# Patient Record
Sex: Male | Born: 1950 | Race: Black or African American | Hispanic: No | Marital: Married | Smoking: Never smoker
Health system: Southern US, Community
[De-identification: ages and names within clinical notes are randomized; demographics above are authoritative.]

## PROBLEM LIST (undated history)

## (undated) DIAGNOSIS — I10 Essential (primary) hypertension: Secondary | ICD-10-CM

## (undated) DIAGNOSIS — Z91013 Allergy to seafood: Secondary | ICD-10-CM

## (undated) DIAGNOSIS — E861 Hypovolemia: Secondary | ICD-10-CM

## (undated) DIAGNOSIS — N179 Acute kidney failure, unspecified: Secondary | ICD-10-CM

## (undated) DIAGNOSIS — Z960 Presence of urogenital implants: Secondary | ICD-10-CM

## (undated) DIAGNOSIS — R748 Abnormal levels of other serum enzymes: Secondary | ICD-10-CM

## (undated) DIAGNOSIS — J9621 Acute and chronic respiratory failure with hypoxia: Secondary | ICD-10-CM

## (undated) DIAGNOSIS — Z931 Gastrostomy status: Secondary | ICD-10-CM

## (undated) DIAGNOSIS — Z930 Tracheostomy status: Secondary | ICD-10-CM

## (undated) DIAGNOSIS — E785 Hyperlipidemia, unspecified: Secondary | ICD-10-CM

## (undated) DIAGNOSIS — G839 Paralytic syndrome, unspecified: Secondary | ICD-10-CM

## (undated) DIAGNOSIS — I469 Cardiac arrest, cause unspecified: Secondary | ICD-10-CM

## (undated) DIAGNOSIS — G909 Disorder of the autonomic nervous system, unspecified: Secondary | ICD-10-CM

## (undated) DIAGNOSIS — S14109S Unspecified injury at unspecified level of cervical spinal cord, sequela: Secondary | ICD-10-CM

## (undated) HISTORY — DX: Abnormal levels of other serum enzymes: R74.8

## (undated) HISTORY — DX: Acute kidney failure, unspecified: N17.9

## (undated) HISTORY — DX: Hyperlipidemia, unspecified: E78.5

## (undated) HISTORY — DX: Allergy to seafood: Z91.013

## (undated) HISTORY — DX: Essential (primary) hypertension: I10

## (undated) NOTE — *Deleted (*Deleted)
[facility] Progress Note   Subjective:  Patient seen in room sitting in chair working with PT. States thirst is about the same as yesterday. Tolerated fluid boluses via PEG without vomiting.  Sodium level down to 154 this morning from 156 (level of 144 likely spurious). BUN also downtrending to 50 from 55.  Denies chest pain, SOB, diarrhea.    Objective Vitals:   07/04/20 0414 07/04/20 0434 07/04/20 0830 07/04/20 0901  BP:  107/63 (!) 102/52   Pulse: 77 76 78 75  Resp: 20 19  20   Temp:  98.8 F (37.1 C)    TempSrc:  Oral    SpO2: 97% 98%  95%  Weight:      Height:       Physical Exam General: Older African-American male performing therapies in wheelchair and in no acute distress. Skin: Face appears greasy, lips and buccal mucosa are dry. Poor turgor noted in bilateral upper extremities with tenting. Head: NCAT, sclera not icteric. Neck: Supple. No lymphadenopathy. Tracheostomy collar in place. Lungs: CTA bilaterally, anteriorly. No wheeze or rales. Scattered rhonchi in upper airways. Breathing is unlabored. Heart: RRR. No murmur, rubs or gallops.  Abdomen: Soft, nontender, no guarding, no rebound tenderness. Abdominal binder in place. PEG tube noted in left upper quadrant. M/S: Quadriplegic. Flaccidity noted in bilateral upper and lower extremities. Lower extremities: Legs and feet wrapped with ace wraps. No apparent edema. Unable to appreciate wounds or skin integrity of LE. Neuro: AAOx3. Neuromuscular exam consistent with SCI. Psych:  Responds to questions appropriately with a normal affect. Appropriate mood for time and situation.  Filed Weights   06/23/20 1730  Weight: 105.2 kg    Intake/Output Summary (Last 24 hours) at 07/04/2020 1106 Last data filed at 07/03/2020 2123 Gross per 24 hour  Intake 948 ml  Output 1300 ml  Net -352 ml    Additional Objective Labs: Basic Metabolic Panel: Recent Labs  Lab 07/03/20 0611 07/03/20 1913 07/04/20 0443   NA 156* 144 154*  K 3.8 3.9 3.6  CL 117* 114* 114*  CO2 26 19* 31  GLUCOSE 111* 173* 102*  BUN 55* 22 50*  CREATININE 1.02 0.83 0.97  CALCIUM 10.0 9.0 9.8   CBC: Recent Labs  Lab 07/03/20 0611  WBC 10.3  HGB 10.2*  HCT 33.1*  MCV 94.6  PLT 314    CBG: Recent Labs  Lab 07/03/20 2008 07/04/20 0021 07/04/20 0416 07/04/20 0750 07/04/20 1104  GLUCAP 185* 223* 93 102* 155*   Medications:  . vitamin C  500 mg Per Tube BID  . bisacodyl  10 mg Rectal QHS  . chlorhexidine  15 mL Mouth Rinse BID  . Chlorhexidine Gluconate Cloth  6 each Topical BID  . collagenase   Topical Daily  . enoxaparin (LOVENOX) injection  40 mg Subcutaneous Q24H  . famotidine  20 mg Per Tube Daily  . feeding supplement (OSMOLITE 1.5 CAL)  474 mL Per Tube QID  . feeding supplement (PROSource TF)  45 mL Per Tube BID  . [START ON 07/05/2020] fludrocortisone  0.2 mg Per Tube Daily  . FLUoxetine  40 mg Per Tube Daily  . free water  400 mL Per Tube Q2H  . guaiFENesin  400 mg Per Tube Q6H  . insulin aspart  0-9 Units Subcutaneous Q4H  . lidocaine  1 patch Transdermal Q24H  . mouth rinse  15 mL Mouth Rinse q12n4p  . midodrine  5 mg Per Tube TID WC  . multivitamin  15 mL Per Tube Daily  . neomycin-bacitracin-polymyxin   Topical BID  . nutrition supplement (JUVEN)  1 packet Per Tube BID BM  . saccharomyces boulardii  250 mg Per Tube BID  . scopolamine  1 patch Transdermal Q72H  . sennosides  5 mL Per Tube Q0600  . traZODone  50 mg Per Tube QHS  . zinc sulfate  220 mg Per Tube Daily    Assessment/Plan: 1. Hypernatremia/Dehydration - in the setting of recent SCI and hypovolemia. Free water deficit = 6L. Continue free water flushes to q2hrs. Continue BMP q12hrs. Titrate flushes to Na level. Strict I/Os.  Once cleared to PO from ST standpoint, allow patient to have unlimited access to free water. This is not diabetes insipidus, it is inadequate access to free water and significant obligate solute  losses related to high protein intake. Should correct with titrated free water, hopefully enteral will be sufficient, but parenteral might be necessary.  Target correction is a sodium of around 150 within the next 24 hours. 2. Orthostatic Hypotension - Likely related to hypovolemia. On fludrocortisone and midodrine.  Per CIR 3. Chronic anemia - Hgb stable at 10.2. Management per primary. 4. Neurogenic bladder and bowel - Maintain foley catheter and monitor strict I/Os. Bowel regimen as per primary. No sodium phos fleet enemas.  Wendall Stade, PA-S2 [facility] 07/04/2020,11:06 AM  LOS: 11 days

## (undated) NOTE — *Deleted (*Deleted)
[facility] Renal Consultation Note    Indication for Consultation:  Hypernatremia  ZRA:QTMAU, Tiney Rouge, MD  HPI: Ryan Day is a 40 y.o. 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 Nephrology was consulted for persistent hypernatremia despite efforts to increase free water intake. Sodium level has trended upwards since admission (146 on admission-->156 today). Other significant labs include Chloride 117, BUN 55, Creatine 1.02 (baseline ~0.7). His current intake consists of 44mL Osmolite 1.5 4x/day + 11mL ProSource BID + 243mL q 3hrs free water flushes, which provides a total of ~3434mL/day. Due to protocols on rehab floor, intake is not consistently documented, so accurate measurement of intake is difficult to ascertain. He was also started on Fludrocortisone on 10/7 for orthostatic hypotension.   Denies personal and family history of kidney disease. Mother had "heart issues" and "swelling in her legs." On chart review, hospitalization for SCI was complicated by PEA with ROSC after 59min and acute kidney injury secondary to hypovolemia.  ROS: Per the patient, he has been experiencing increased thirst for which he is given ice chips. He feels he has been urinating large amounts. He denies dysuria but does have a foley catheter in place. He admits to a small amount of diarrhea. He denies vomiting. His nurse denies other insensible losses such as increased salivation/secretions or sweating.  Past Medical History:  Diagnosis Date  . Acute on chronic respiratory failure with hypoxia ([place])   . Acute renal injury due to hypovolemia ([place])   . Autonomic instability   . Cardiac arrest ([place])   . Cervical spinal cord injury, sequela  ([place])   . Elevated alkaline phosphatase level   . 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 Hyperlipidemia   . Hypertension    Past Surgical History:  Procedure Laterality Date  . COLONOSCOPY WITH PROPOFOL N/A 12/05/2017   Procedure: COLONOSCOPY WITH PROPOFOL;  Surgeon: Lin Landsman, MD;  Location: [facility] ENDOSCOPY;  Service: Gastroenterology;  Laterality: N/A;  . ESOPHAGOGASTRODUODENOSCOPY  12/05/2017   Procedure: ESOPHAGOGASTRODUODENOSCOPY (EGD);  Surgeon: Lin Landsman, MD;  Location: [facility] ENDOSCOPY;  Service: Gastroenterology;;   Family History  Problem Relation Age of Onset  . Cancer Father        lung  . Cancer Brother        throat  . Heart disease Brother 54  . Liver cancer Brother   . Alcohol abuse Brother    Social History:  reports that he has never smoked. He has never used smokeless tobacco. He reports current alcohol use. He reports that he does not use drugs. No Known Allergies Prior to Admission medications   Medication Sig Start Date End Date Taking? Authorizing Provider  aspirin 81 MG chewable tablet Chew 81 mg by mouth daily.   Yes [provider]  hydrochlorothiazide (HYDRODIURIL) 25 MG tablet Take 1 tablet (25 mg total) by mouth daily. 02/01/20  Yes Johnson, Megan P, DO  simvastatin (ZOCOR) 40 MG tablet Take 1 tablet (40 mg total) by mouth daily. 02/01/20  Yes Johnson, Megan P, DO   Current Facility-Administered Medications  Medication Dose Route Frequency Provider Last Rate Last Admin  . acetaminophen (TYLENOL) 160 MG/5ML solution 320-640 mg  320-640 mg Per Tube Q4H PRN Lovorn, Megan, MD   640 mg at 07/02/20 1952  . alum & mag hydroxide-simeth (  MAALOX/MYLANTA) 200-200-20 MG/5ML suspension 30 mL  30 mL Per Tube Q4H PRN Lovorn, Megan, MD   30 mL at 07/02/20 1210  . ascorbic acid (VITAMIN C) tablet 500 mg  500 mg Per Tube BID Jacquelynn Cree, PA-C   500 mg at 07/03/20 0806  . bisacodyl (DULCOLAX) suppository 10 mg  10 mg Rectal QHS  Lovorn, Megan, MD   10 mg at 07/02/20 1749  . chlorhexidine (PERIDEX) 0.12 % solution 15 mL  15 mL Mouth Rinse BID Jacquelynn Cree, PA-C   15 mL at 07/03/20 0806  . Chlorhexidine Gluconate Cloth 2 % PADS 6 each  6 each Topical BID Raulkar, Drema Pry, MD   6 each at 07/03/20 0422  . collagenase (SANTYL) ointment   Topical Daily Genice Rouge, MD   Given at 07/03/20 [phone]  . diphenhydrAMINE (BENADRYL) 12.5 MG/5ML elixir 12.5-25 mg  12.5-25 mg Per Tube Q6H PRN Lovorn, Megan, MD      . enoxaparin (LOVENOX) injection 40 mg  40 mg Subcutaneous Q24H Love, Pamela S, PA-C   40 mg at 07/02/20 1933  . famotidine (PEPCID) tablet 20 mg  20 mg Per Tube Daily Jacquelynn Cree, PA-C   20 mg at 07/03/20 0806  . feeding supplement (OSMOLITE 1.5 CAL) liquid 474 mL  474 mL Per Tube QID Lovorn, Megan, MD   474 mL at 07/03/20 0808  . feeding supplement (PROSource TF) liquid 45 mL  45 mL Per Tube BID Lovorn, Megan, MD   45 mL at 07/03/20 0808  . fludrocortisone (FLORINEF) 0.1mg /mL oral suspension 0.1 mg  0.1 mg Per Tube Daily Lovorn, Megan, MD   0.1 mg at 07/03/20 0807  . FLUoxetine (PROZAC) 20 MG/5ML solution 40 mg  40 mg Per Tube Daily Jacquelynn Cree, PA-C   40 mg at 07/03/20 1191  . free water 250 mL  250 mL Per Tube Q3H Lovorn, Megan, MD   250 mL at 07/03/20 0809  . guaiFENesin tablet 400 mg  400 mg Per Tube Q6H LoveEvlyn Kanner, PA-C   400 mg at 07/03/20 0601  . guaiFENesin-dextromethorphan (ROBITUSSIN DM) 100-10 MG/5ML syrup 5-10 mL  5-10 mL Per Tube Q6H PRN Lovorn, Megan, MD      . insulin aspart (novoLOG) injection 0-9 Units  0-9 Units Subcutaneous Q4H Jacquelynn Cree, PA-C   3 Units at 07/03/20 0017  . lidocaine (LIDODERM) 5 % 1 patch  1 patch Transdermal Q24H Jacquelynn Cree, PA-C   1 patch at 07/03/20 4782  . MEDLINE mouth rinse  15 mL Mouth Rinse q12n4p Jacquelynn Cree, PA-C   15 mL at 07/02/20 1558  . midodrine (PROAMATINE) tablet 5 mg  5 mg Per Tube TID WC Lovorn, Megan, MD   5 mg at 07/03/20 0806  . multivitamin  liquid 15 mL  15 mL Per Tube Daily Jacquelynn Cree, PA-C   15 mL at 07/03/20 0807  . neomycin-bacitracin-polymyxin (NEOSPORIN) ointment   Topical BID Genice Rouge, MD   Given at 07/03/20 0806  . nutrition supplement (JUVEN) (JUVEN) powder packet 1 packet  1 packet Per Tube BID BM Jacquelynn Cree, PA-C   1 packet at 07/03/20 0908  . polyethylene glycol (MIRALAX / GLYCOLAX) packet 17 g  17 g Per Tube Daily PRN Lovorn, Megan, MD      . prochlorperazine (COMPAZINE) tablet 5-10 mg  5-10 mg Oral Q6H PRN Love, Pamela S, PA-C       Or  . prochlorperazine (COMPAZINE) injection  5-10 mg  5-10 mg Intramuscular Q6H PRN Love, Pamela S, PA-C       Or  . prochlorperazine (COMPAZINE) suppository 12.5 mg  12.5 mg Rectal Q6H PRN Love, Pamela S, PA-C      . saccharomyces boulardii (FLORASTOR) capsule 250 mg  250 mg Per Tube BID Jacquelynn Cree, PA-C   250 mg at 07/03/20 0806  . scopolamine (TRANSDERM-SCOP) 1 MG/3DAYS 1.5 mg  1 patch Transdermal Q72H Love, Evlyn Kanner, PA-C   1.5 mg at 07/02/20 1933  . sennosides (SENOKOT) 8.8 MG/5ML syrup 5 mL  5 mL Per Tube Q0600 Jacquelynn Cree, PA-C   5 mL at 07/03/20 0601  . sodium phosphate (FLEET) 7-19 GM/118ML enema 1 enema  1 enema Rectal Once PRN Love, Pamela S, PA-C      . traZODone (DESYREL) tablet 50 mg  50 mg Per Tube QHS Jacquelynn Cree, PA-C   50 mg at 07/02/20 2218  . zinc sulfate capsule 220 mg  220 mg Per Tube Daily Jacquelynn Cree, PA-C   220 mg at 07/03/20 4540   Labs: Basic Metabolic Panel: Recent Labs  Lab 06/30/20 0536 07/02/20 0032 07/03/20 0611  NA 151* 154* 156*  K 3.6 4.9 3.8  CL 112* 117* 117*  CO2 29 25 26   GLUCOSE 97 132* 111*  BUN 46* 58* 55*  CREATININE 0.87 0.94 1.02  CALCIUM 9.9 10.1 10.0   CBC: Recent Labs  Lab 07/03/20 0611  WBC 10.3  HGB 10.2*  HCT 33.1*  MCV 94.6  PLT 314   CBG: Recent Labs  Lab 07/02/20 1544 07/02/20 1958 07/03/20 0003 07/03/20 0412 07/03/20 0814  GLUCAP 106* 176* 229* 101* 113*    Intake/Output Summary  (Last 24 hours) at 07/03/2020 1027 Last data filed at 07/03/2020 1011 Gross per 24 hour  Intake 474 ml  Output 2650 ml  Net -2176 ml    ROS: All others negative except those listed in HPI.  Physical Exam: Vitals:   07/02/20 2345 07/03/20 0403 07/03/20 0413 07/03/20 0846  BP:   99/63   Pulse: 75 76 88 81  Resp: 18 18 20 16   Temp:   97.6 F (36.4 C)   TempSrc:      SpO2: 96% 100% 97% 96%  Weight:      Height:         General: Older African-American male sitting in wheelchair appearing uncomfortable but in no acute distress. Skin: Face appears greasy, lips and buccal mucosa are dry. Poor turgor noted in bilateral upper extremities with tenting. Head: NCAT, sclera not icteric. Neck: Supple. No lymphadenopathy. Tracheostomy collar in place. Lungs: CTA bilaterally, anteriorly. No wheeze or rales. Scattered rhonchi in upper airways. Breathing is unlabored. Heart: RRR. No murmur, rubs or gallops.  Abdomen: Soft, nontender, no guarding, no rebound tenderness. Abdominal binder in place. PEG tube noted in left upper quadrant. M/S: Quadriplegic. Flaccidity noted in bilateral upper and lower extremities. Lower extremities: Legs and feet wrapped with ace wraps. No apparent edema. Unable to appreciate wounds or skin integrity of LE. Neuro: AAOx3. Neuromuscular exam consistent with SCI. Psych:  Responds to questions appropriately with a normal affect. Appropriate mood for time and situation.   Assessment/Plan: 1. Hypernatremia - in the setting of recent SCI and hypovolemia. Free water deficit = 7L. Increase free water flushes to q2hrs. BMP q12hrs. Titrate flushes to Na level. Strict I/Os. 2. Orthostatic Hypotension - Likely related to hypovolemia. On fludrocortisone and midodrine.  3. Chronic anemia -  Hgb stable at 10.2. Management per primary. 4. Neurogenic bladder and bowel - Maintain foley catheter and monitor strict I/Os. Bowel regimen as per primary. No sodium phos fleet enemas.     Wendall Stade, PA-S2 [facility] 07/03/2020, 9:41 AM

## (undated) NOTE — *Deleted (*Deleted)
[facility] Progress Note   Subjective:   Patient seen and examined at bedside. He continues to complain of thirst, unchanged from the days prior. Na level down to 147 this morning, BUN 42, Cr 0.95. Overall fluid status improving. Urinating more with increased water flushes.   Denies CP, SOB, nausea, vomiting.  Objective Vitals:   07/05/20 0035 07/05/20 0432 07/05/20 0440 07/05/20 0905  BP:  111/62    Pulse: (!) 53 74 73 79  Resp: 16 18 18 18   Temp:  98 F (36.7 C)    TempSrc:  Axillary    SpO2: 97% 94% 93% 93%  Weight:      Height:       Physical Exam General:Older African-American male performing therapies in wheelchair and in no acute distress. Skin:Face appears greasy, lips and buccal mucosa are dry. Poor turgor noted in bilateral upper extremities with tenting. Head:NCAT, sclera not icteric. Neck: Supple. No lymphadenopathy. Tracheostomy collar in place. Lungs: CTA bilaterally, anteriorly. No wheeze or rales. Scattered rhonchi in upper airways. Breathing is unlabored. Heart:RRR. No murmur, rubs or gallops.  Abdomen: Soft, nontender, no guarding, no rebound tenderness. Abdominal binder in place. PEG tube noted in left upper quadrant. M/S: Quadriplegic. Flaccidity noted in bilateral upper and lower extremities. Lower extremities: Legs and feet wrapped with ace wraps. No apparent edema. Unable to appreciate wounds or skin integrity of LE. Neuro: AAOx3. Neuromuscular exam consistent with SCI. Psych: Responds to questions appropriately with a normal affect. Appropriate mood for time and situation.  Filed Weights   06/23/20 1730  Weight: 105.2 kg    Intake/Output Summary (Last 24 hours) at 07/05/2020 1104 Last data filed at 07/05/2020 1610 Gross per 24 hour  Intake 2874 ml  Output 2600 ml  Net 274 ml    Additional Objective Labs: Basic Metabolic Panel: Recent Labs  Lab 07/03/20 1913 07/04/20 0443 07/05/20 0956  NA 144 154* 147*  K 3.9 3.6  3.4*  CL 114* 114* 109  CO2 19* 31 27  GLUCOSE 173* 102* 239*  BUN 22 50* 42*  CREATININE 0.83 0.97 0.95  CALCIUM 9.0 9.8 9.3   CBC: Recent Labs  Lab 07/03/20 0611  WBC 10.3  HGB 10.2*  HCT 33.1*  MCV 94.6  PLT 314   CBG: Recent Labs  Lab 07/04/20 1602 07/04/20 1949 07/04/20 2334 07/05/20 0427 07/05/20 0832  GLUCAP 108* 172* 189* 89 124*   Medications:  . vitamin C  500 mg Per Tube BID  . bisacodyl  10 mg Rectal QHS  . chlorhexidine  15 mL Mouth Rinse BID  . Chlorhexidine Gluconate Cloth  6 each Topical BID  . collagenase   Topical Daily  . enoxaparin (LOVENOX) injection  40 mg Subcutaneous Q24H  . famotidine  20 mg Per Tube Daily  . feeding supplement (OSMOLITE 1.5 CAL)  474 mL Per Tube QID  . feeding supplement (PROSource TF)  45 mL Per Tube BID  . fludrocortisone  0.2 mg Per Tube Daily  . FLUoxetine  40 mg Per Tube Daily  . free water  400 mL Per Tube Q2H  . guaiFENesin  400 mg Per Tube Q6H  . insulin aspart  0-9 Units Subcutaneous Q4H  . lidocaine  1 patch Transdermal Q24H  . mouth rinse  15 mL Mouth Rinse q12n4p  . midodrine  5 mg Per Tube TID WC  . multivitamin  15 mL Per Tube Daily  . neomycin-bacitracin-polymyxin   Topical BID  . nutrition supplement (JUVEN)  1 packet Per Tube BID BM  . saccharomyces boulardii  250 mg Per Tube BID  . scopolamine  1 patch Transdermal Q72H  . sennosides  5 mL Per Tube Q0600  . traZODone  50 mg Per Tube QHS  . zinc sulfate  220 mg Per Tube Daily    Dialysis Orders:  Assessment/Plan: 1. Hypernatremia/Dehydration- in the setting of recent SCI and hypovolemia. Continue free water flushes to q2hrs for another 24 hours, then may decrease frequency and titrate to target Na level of 140mg /dL. Monitor BMP qAM. Once cleared to PO from ST standpoint, allow patient to have unlimited access to free water. This is not diabetes insipidus, it is inadequate access to free water and significant obligate solute losses related to  high protein intake. Will sign off at this time. We will remain available as needed.  2. Orthostatic Hypotension- Improving. Likely related to hypovolemia. On fludrocortisone and midodrine. Per CIR 3. Chronic anemia- Hgb stable at 10.2. Management per primary. 4. Neurogenic bladder and bowel - Maintain foley catheter and monitor strict I/Os. Bowel regimen as per primary. No sodium phos fleet enemas.  Wendall Stade, PA-S2 [facility] 07/05/2020,11:04 AM  LOS: 12 days

---

## 2006-06-19 ENCOUNTER — Ambulatory Visit: Payer: Self-pay | Admitting: Gastroenterology

## 2015-01-20 DIAGNOSIS — E785 Hyperlipidemia, unspecified: Secondary | ICD-10-CM | POA: Insufficient documentation

## 2015-01-20 DIAGNOSIS — I10 Essential (primary) hypertension: Secondary | ICD-10-CM | POA: Insufficient documentation

## 2015-02-28 ENCOUNTER — Ambulatory Visit: Payer: Self-pay | Admitting: Family Medicine

## 2015-03-13 ENCOUNTER — Encounter: Payer: Self-pay | Admitting: Family Medicine

## 2015-03-13 ENCOUNTER — Ambulatory Visit (INDEPENDENT_AMBULATORY_CARE_PROVIDER_SITE_OTHER): Payer: BLUE CROSS/BLUE SHIELD | Admitting: Family Medicine

## 2015-03-13 VITALS — BP 132/80 | HR 69 | Temp 98.0°F | Ht 76.8 in | Wt 252.0 lb

## 2015-03-13 DIAGNOSIS — R748 Abnormal levels of other serum enzymes: Secondary | ICD-10-CM | POA: Diagnosis not present

## 2015-03-13 DIAGNOSIS — E785 Hyperlipidemia, unspecified: Secondary | ICD-10-CM | POA: Diagnosis not present

## 2015-03-13 DIAGNOSIS — I10 Essential (primary) hypertension: Secondary | ICD-10-CM | POA: Diagnosis not present

## 2015-03-13 MED ORDER — HYDROCHLOROTHIAZIDE 25 MG PO TABS: 25.0000 mg | ORAL_TABLET | Freq: Every day | ORAL | Status: DC

## 2015-03-13 MED ORDER — SIMVASTATIN 40 MG PO TABS: 40.0000 mg | ORAL_TABLET | Freq: Every day | ORAL | Status: DC

## 2015-03-13 NOTE — Assessment & Plan Note (Signed)
The current medical regimen is effective;  continue present plan and medications.  

## 2015-03-13 NOTE — Progress Notes (Signed)
   BP 132/80 mmHg  Pulse 69  Temp(Src) 98 F (36.7 C)  Ht 6' 4.8" (1.951 m)  Wt 252 lb (114.306 kg)  BMI 30.03 kg/m2  SpO2 97%   Subjective:    Patient ID: Ryan Day, male    DOB: Jul 25, 1951, 64 y.o.   MRN: [id number]  HPI: Ryan Day is a 64 y.o. male  Chief Complaint  Patient presents with  . Hyperlipidemia  . Hypertension   Doing well on meds and no further swelling from last visit Takes meds every day No side effects BP checked at work is always good  Relevant past medical, surgical, family and social history reviewed and updated as indicated. Interim medical history since our last visit reviewed. Allergies and medications reviewed and updated.  Review of Systems  Constitutional: Negative.   Respiratory: Negative.   Cardiovascular: Negative.     Per HPI unless specifically indicated above     Objective:    BP 132/80 mmHg  Pulse 69  Temp(Src) 98 F (36.7 C)  Ht 6' 4.8" (1.951 m)  Wt 252 lb (114.306 kg)  BMI 30.03 kg/m2  SpO2 97%  Wt Readings from Last 3 Encounters:  03/13/15 252 lb (114.306 kg)  01/09/15 252 lb (114.306 kg)    Physical Exam  Constitutional: He is oriented to person, place, and time. He appears well-developed and well-nourished. No distress.  HENT:  Head: Normocephalic and atraumatic.  Right Ear: Hearing normal.  Left Ear: Hearing normal.  Nose: Nose normal.  Eyes: Conjunctivae and lids are normal. Right eye exhibits no discharge. Left eye exhibits no discharge. No scleral icterus.  Cardiovascular: Normal rate, regular rhythm and normal heart sounds.   Pulmonary/Chest: Effort normal and breath sounds normal. No respiratory distress.  Musculoskeletal: Normal range of motion.  Neurological: He is alert and oriented to person, place, and time.  Skin: Skin is intact. No rash noted.  Psychiatric: He has a normal mood and affect. His speech is normal and behavior is normal. Judgment and thought content normal. Cognition and memory are  normal.    No results found for this or any previous visit.    Assessment & Plan:   Problem List Items Addressed This Visit      Cardiovascular and Mediastinum   Hypertension - Primary    The current medical regimen is effective;  continue present plan and medications.       Relevant Medications   hydrochlorothiazide (HYDRODIURIL) 25 MG tablet   simvastatin (ZOCOR) 40 MG tablet   Other Relevant Orders   Comprehensive metabolic panel     Other   Hyperlipidemia    The current medical regimen is effective;  continue present plan and medications.       Relevant Medications   hydrochlorothiazide (HYDRODIURIL) 25 MG tablet   simvastatin (ZOCOR) 40 MG tablet   Other Relevant Orders   Comprehensive metabolic panel   Lipid panel   Elevated alkaline phosphatase level    Labs pending      Relevant Orders   Comprehensive metabolic panel       Follow up plan: Return in about 6 months (around 09/12/2015) for Physical Exam.

## 2015-03-13 NOTE — Assessment & Plan Note (Signed)
Labs pending.  

## 2015-03-14 LAB — COMPREHENSIVE METABOLIC PANEL
A/G RATIO: 1.5 (ref 1.1–2.5)
ALK PHOS: 143 IU/L — AB (ref 39–117)
ALT: 16 IU/L (ref 0–44)
AST: 18 IU/L (ref 0–40)
Albumin: 4.1 g/dL (ref 3.6–4.8)
BUN/Creatinine Ratio: 8 — ABNORMAL LOW (ref 10–22)
BUN: 8 mg/dL (ref 8–27)
Bilirubin Total: 0.9 mg/dL (ref 0.0–1.2)
CHLORIDE: 100 mmol/L (ref 97–108)
CO2: 28 mmol/L (ref 18–29)
Calcium: 9.9 mg/dL (ref 8.6–10.2)
Creatinine, Ser: 0.97 mg/dL (ref 0.76–1.27)
GFR calc Af Amer: 96 mL/min/{1.73_m2} (ref >59)
GFR, EST NON AFRICAN AMERICAN: 83 mL/min/{1.73_m2} (ref >59)
GLOBULIN, TOTAL: 2.8 g/dL (ref 1.5–4.5)
Glucose: 103 mg/dL — ABNORMAL HIGH (ref 65–99)
POTASSIUM: 4 mmol/L (ref 3.5–5.2)
SODIUM: 141 mmol/L (ref 134–144)
Total Protein: 6.9 g/dL (ref 6.0–8.5)

## 2015-03-14 LAB — LIPID PANEL
Chol/HDL Ratio: 3.1 ratio units (ref 0.0–5.0)
Cholesterol, Total: 178 mg/dL (ref 100–199)
HDL: 57 mg/dL (ref >39)
LDL Calculated: 105 mg/dL — ABNORMAL HIGH (ref 0–99)
TRIGLYCERIDES: 79 mg/dL (ref 0–149)
VLDL Cholesterol Cal: 16 mg/dL (ref 5–40)

## 2015-08-31 ENCOUNTER — Encounter: Payer: BLUE CROSS/BLUE SHIELD | Admitting: Family Medicine

## 2015-11-29 ENCOUNTER — Ambulatory Visit (INDEPENDENT_AMBULATORY_CARE_PROVIDER_SITE_OTHER): Payer: BLUE CROSS/BLUE SHIELD | Admitting: Family Medicine

## 2015-11-29 ENCOUNTER — Encounter: Payer: Self-pay | Admitting: Family Medicine

## 2015-11-29 VITALS — BP 126/83 | HR 67 | Temp 98.0°F | Ht 75.7 in | Wt 247.0 lb

## 2015-11-29 DIAGNOSIS — Z0000 Encounter for general adult medical examination without abnormal findings: Secondary | ICD-10-CM | POA: Diagnosis not present

## 2015-11-29 DIAGNOSIS — I10 Essential (primary) hypertension: Secondary | ICD-10-CM | POA: Diagnosis not present

## 2015-11-29 DIAGNOSIS — Z113 Encounter for screening for infections with a predominantly sexual mode of transmission: Secondary | ICD-10-CM

## 2015-11-29 DIAGNOSIS — R748 Abnormal levels of other serum enzymes: Secondary | ICD-10-CM | POA: Diagnosis not present

## 2015-11-29 DIAGNOSIS — E785 Hyperlipidemia, unspecified: Secondary | ICD-10-CM | POA: Diagnosis not present

## 2015-11-29 LAB — URINALYSIS, ROUTINE W REFLEX MICROSCOPIC
Bilirubin, UA: NEGATIVE
Glucose, UA: NEGATIVE
Ketones, UA: NEGATIVE
LEUKOCYTES UA: NEGATIVE
NITRITE UA: NEGATIVE
PH UA: 6.5 (ref 5.0–7.5)
PROTEIN UA: NEGATIVE
RBC, UA: NEGATIVE
Specific Gravity, UA: 1.01 (ref 1.005–1.030)
Urobilinogen, Ur: 1 mg/dL (ref 0.2–1.0)

## 2015-11-29 MED ORDER — SIMVASTATIN 40 MG PO TABS: 40.0000 mg | ORAL_TABLET | Freq: Every day | ORAL | Status: DC

## 2015-11-29 MED ORDER — HYDROCHLOROTHIAZIDE 25 MG PO TABS: 25.0000 mg | ORAL_TABLET | Freq: Every day | ORAL | Status: DC

## 2015-11-29 NOTE — Progress Notes (Signed)
BP 126/83 mmHg  Pulse 67  Temp(Src) 98 F (36.7 C)  Ht 6' 3.7" (1.923 m)  Wt 247 lb (112.038 kg)  BMI 30.30 kg/m2  SpO2 97%   Subjective:    Patient ID: Ryan Day, male    DOB: 09/28/1950, 65 y.o.   MRN: [id number]  HPI: Ryan Day is a 65 y.o. male  Chief Complaint  Patient presents with  . Annual Exam   Patient all in all doing well for physical takes medications for blood pressure cholesterol without side effects and faithfully. Has some fatigue gets adequate rest does snore but no other sleep apnea type symptoms We'll get wife to observe for apneic spells. Relevant past medical, surgical, family and social history reviewed and updated as indicated. Interim medical history since our last visit reviewed. Allergies and medications reviewed and updated.  Review of Systems  Constitutional: Negative.   HENT: Negative.   Eyes: Negative.   Respiratory: Negative.   Cardiovascular: Negative.   Gastrointestinal: Negative.   Endocrine: Negative.   Genitourinary: Negative.   Musculoskeletal: Negative.   Skin: Negative.   Allergic/Immunologic: Negative.   Neurological: Negative.   Hematological: Negative.   Psychiatric/Behavioral: Negative.     Per HPI unless specifically indicated above     Objective:    BP 126/83 mmHg  Pulse 67  Temp(Src) 98 F (36.7 C)  Ht 6' 3.7" (1.923 m)  Wt 247 lb (112.038 kg)  BMI 30.30 kg/m2  SpO2 97%  Wt Readings from Last 3 Encounters:  11/29/15 247 lb (112.038 kg)  03/13/15 252 lb (114.306 kg)  01/09/15 252 lb (114.306 kg)    Physical Exam  Constitutional: He is oriented to person, place, and time. He appears well-developed and well-nourished.  HENT:  Head: Normocephalic and atraumatic.  Right Ear: External ear normal.  Left Ear: External ear normal.  Eyes: Conjunctivae and EOM are normal. Pupils are equal, round, and reactive to light.  Neck: Normal range of motion. Neck supple.  Cardiovascular: Normal rate, regular rhythm,  normal heart sounds and intact distal pulses.   Pulmonary/Chest: Effort normal and breath sounds normal.  Abdominal: Soft. Bowel sounds are normal. There is no splenomegaly or hepatomegaly.  Genitourinary: Rectum normal, prostate normal and penis normal.  Musculoskeletal: Normal range of motion.  Neurological: He is alert and oriented to person, place, and time. He has normal reflexes.  Skin: No rash noted. No erythema.  Psychiatric: He has a normal mood and affect. His behavior is normal. Judgment and thought content normal.    Results for orders placed or performed in visit on 03/13/15  Comprehensive metabolic panel  Result Value Ref Range   Glucose 103 (H) 65 - 99 mg/dL   BUN 8 8 - 27 mg/dL   Creatinine, Ser 0.97 0.76 - 1.27 mg/dL   GFR calc non Af Amer 83 >59 mL/min/1.73   GFR calc Af Amer 96 >59 mL/min/1.73   BUN/Creatinine Ratio 8 (L) 10 - 22   Sodium 141 134 - 144 mmol/L   Potassium 4.0 3.5 - 5.2 mmol/L   Chloride 100 97 - 108 mmol/L   CO2 28 18 - 29 mmol/L   Calcium 9.9 8.6 - 10.2 mg/dL   Total Protein 6.9 6.0 - 8.5 g/dL   Albumin 4.1 3.6 - 4.8 g/dL   Globulin, Total 2.8 1.5 - 4.5 g/dL   Albumin/Globulin Ratio 1.5 1.1 - 2.5   Bilirubin Total 0.9 0.0 - 1.2 mg/dL   Alkaline Phosphatase 143 (H) 39 - 117  IU/L   AST 18 0 - 40 IU/L   ALT 16 0 - 44 IU/L  Lipid panel  Result Value Ref Range   Cholesterol, Total 178 100 - 199 mg/dL   Triglycerides 79 0 - 149 mg/dL   HDL 57 >39 mg/dL   VLDL Cholesterol Cal 16 5 - 40 mg/dL   LDL Calculated 105 (H) 0 - 99 mg/dL   Chol/HDL Ratio 3.1 0.0 - 5.0 ratio units      Assessment & Plan:   Problem List Items Addressed This Visit      Cardiovascular and Mediastinum   Hypertension    The current medical regimen is effective;  continue present plan and medications.       Relevant Medications   simvastatin (ZOCOR) 40 MG tablet   hydrochlorothiazide (HYDRODIURIL) 25 MG tablet     Other   Hyperlipidemia    The current medical  regimen is effective;  continue present plan and medications.       Relevant Medications   simvastatin (ZOCOR) 40 MG tablet   hydrochlorothiazide (HYDRODIURIL) 25 MG tablet   Elevated alkaline phosphatase level    Labs today       Other Visit Diagnoses    Routine general medical examination at a health care facility    -  Primary    Relevant Orders    CBC with Differential/Platelet    Comprehensive metabolic panel    Lipid Panel w/o Chol/HDL Ratio    PSA    TSH    Urinalysis, Routine w reflex microscopic (not at [facility])    Routine screening for STI (sexually transmitted infection)        Relevant Orders    Hepatitis C Antibody    HIV antibody        Follow up plan: Return in about 6 months (around 05/31/2016) for BMP, lipids, alt, ast.

## 2015-11-29 NOTE — Addendum Note (Signed)
Addended by: Wynn Maudlin on: 11/29/2015 09:57 AM   Modules accepted: Miquel Dunn

## 2015-11-29 NOTE — Assessment & Plan Note (Signed)
The current medical regimen is effective;  continue present plan and medications.  

## 2015-11-29 NOTE — Assessment & Plan Note (Signed)
Labs today

## 2015-11-30 ENCOUNTER — Encounter: Payer: Self-pay | Admitting: Family Medicine

## 2015-11-30 LAB — CBC WITH DIFFERENTIAL/PLATELET
BASOS: 0 %
Basophils Absolute: 0 10*3/uL (ref 0.0–0.2)
EOS (ABSOLUTE): 0.1 10*3/uL (ref 0.0–0.4)
EOS: 2 %
Hematocrit: 42.2 % (ref 37.5–51.0)
Hemoglobin: 14.7 g/dL (ref 12.6–17.7)
IMMATURE GRANULOCYTES: 0 %
Immature Grans (Abs): 0 10*3/uL (ref 0.0–0.1)
LYMPHS ABS: 1.5 10*3/uL (ref 0.7–3.1)
Lymphs: 32 %
MCH: 31.1 pg (ref 26.6–33.0)
MCHC: 34.8 g/dL (ref 31.5–35.7)
MCV: 89 fL (ref 79–97)
MONOCYTES: 12 %
MONOS ABS: 0.6 10*3/uL (ref 0.1–0.9)
Neutrophils Absolute: 2.5 10*3/uL (ref 1.4–7.0)
Neutrophils: 54 %
Platelets: 240 10*3/uL (ref 150–379)
RBC: 4.73 x10E6/uL (ref 4.14–5.80)
RDW: 14.2 % (ref 12.3–15.4)
WBC: 4.6 10*3/uL (ref 3.4–10.8)

## 2015-11-30 LAB — COMPREHENSIVE METABOLIC PANEL
ALT: 13 IU/L (ref 0–44)
AST: 13 IU/L (ref 0–40)
Albumin/Globulin Ratio: 1.5 (ref 1.1–2.5)
Albumin: 4.4 g/dL (ref 3.6–4.8)
Alkaline Phosphatase: 129 IU/L — ABNORMAL HIGH (ref 39–117)
BUN/Creatinine Ratio: 7 — ABNORMAL LOW (ref 10–22)
BUN: 7 mg/dL — ABNORMAL LOW (ref 8–27)
Bilirubin Total: 1.5 mg/dL — ABNORMAL HIGH (ref 0.0–1.2)
CALCIUM: 9.8 mg/dL (ref 8.6–10.2)
CO2: 25 mmol/L (ref 18–29)
Chloride: 97 mmol/L (ref 96–106)
Creatinine, Ser: 1.07 mg/dL (ref 0.76–1.27)
GFR calc Af Amer: 85 mL/min/{1.73_m2} (ref >59)
GFR, EST NON AFRICAN AMERICAN: 73 mL/min/{1.73_m2} (ref >59)
GLOBULIN, TOTAL: 2.9 g/dL (ref 1.5–4.5)
Glucose: 105 mg/dL — ABNORMAL HIGH (ref 65–99)
POTASSIUM: 4 mmol/L (ref 3.5–5.2)
SODIUM: 141 mmol/L (ref 134–144)
Total Protein: 7.3 g/dL (ref 6.0–8.5)

## 2015-11-30 LAB — LIPID PANEL W/O CHOL/HDL RATIO
Cholesterol, Total: 165 mg/dL (ref 100–199)
HDL: 48 mg/dL (ref >39)
LDL Calculated: 100 mg/dL — ABNORMAL HIGH (ref 0–99)
TRIGLYCERIDES: 83 mg/dL (ref 0–149)
VLDL Cholesterol Cal: 17 mg/dL (ref 5–40)

## 2015-11-30 LAB — PSA: Prostate Specific Ag, Serum: 0.5 ng/mL (ref 0.0–4.0)

## 2015-11-30 LAB — TSH: TSH: 0.572 u[IU]/mL (ref 0.450–4.500)

## 2015-11-30 LAB — HEPATITIS C ANTIBODY

## 2015-11-30 LAB — HIV ANTIBODY (ROUTINE TESTING W REFLEX): HIV Screen 4th Generation wRfx: NONREACTIVE

## 2016-06-03 ENCOUNTER — Encounter: Payer: Self-pay | Admitting: Family Medicine

## 2016-06-03 ENCOUNTER — Ambulatory Visit (INDEPENDENT_AMBULATORY_CARE_PROVIDER_SITE_OTHER): Payer: BLUE CROSS/BLUE SHIELD | Admitting: Family Medicine

## 2016-06-03 VITALS — BP 125/74 | HR 73 | Temp 98.0°F | Wt 250.0 lb

## 2016-06-03 DIAGNOSIS — I10 Essential (primary) hypertension: Secondary | ICD-10-CM

## 2016-06-03 DIAGNOSIS — E785 Hyperlipidemia, unspecified: Secondary | ICD-10-CM

## 2016-06-03 LAB — LP+ALT+AST PICCOLO, WAIVED
ALT (SGPT) Piccolo, Waived: 12 U/L (ref 10–47)
AST (SGOT) PICCOLO, WAIVED: 20 U/L (ref 11–38)
CHOL/HDL RATIO PICCOLO,WAIVE: 3 mg/dL
CHOLESTEROL PICCOLO, WAIVED: 185 mg/dL (ref <200)
HDL CHOL PICCOLO, WAIVED: 61 mg/dL (ref >59)
LDL Chol Calc Piccolo Waived: 108 mg/dL — ABNORMAL HIGH (ref <100)
Triglycerides Piccolo,Waived: 79 mg/dL (ref <150)
VLDL Chol Calc Piccolo,Waive: 16 mg/dL (ref <30)

## 2016-06-03 NOTE — Assessment & Plan Note (Signed)
The current medical regimen is effective;  continue present plan and medications.  

## 2016-06-03 NOTE — Progress Notes (Signed)
BP 125/74 (BP Location: Right Arm, Cuff Size: Normal)   Pulse 73   Temp 98 F (36.7 C)   Wt 250 lb (113.4 kg) Comment: with shoes  SpO2 98%   BMI 30.67 kg/m    Subjective:    Patient ID: Ryan Day, male    DOB: [birth year], 65 y.o.   MRN: [id number]  HPI: Ryan Day is a 65 y.o. male  Chief Complaint  Patient presents with  . Hypertension  . Hyperlipidemia  Follow-up hypertension hypercholesterol doing well with no complaints from medications taken faithfully no side effects and good control of blood pressure and cholesterol.  Relevant past medical, surgical, family and social history reviewed and updated as indicated. Interim medical history since our last visit reviewed. Allergies and medications reviewed and updated.  Review of Systems  Constitutional: Negative.   Respiratory: Negative.   Cardiovascular: Negative.     Per HPI unless specifically indicated above     Objective:    BP 125/74 (BP Location: Right Arm, Cuff Size: Normal)   Pulse 73   Temp 98 F (36.7 C)   Wt 250 lb (113.4 kg) Comment: with shoes  SpO2 98%   BMI 30.67 kg/m   Wt Readings from Last 3 Encounters:  06/03/16 250 lb (113.4 kg)  11/29/15 247 lb (112 kg)  03/13/15 252 lb (114.3 kg)    Physical Exam  Constitutional: He is oriented to person, place, and time. He appears well-developed and well-nourished. No distress.  HENT:  Head: Normocephalic and atraumatic.  Right Ear: Hearing normal.  Left Ear: Hearing normal.  Nose: Nose normal.  Eyes: Conjunctivae and lids are normal. Right eye exhibits no discharge. Left eye exhibits no discharge. No scleral icterus.  Cardiovascular: Normal rate, regular rhythm and normal heart sounds.   Pulmonary/Chest: Effort normal and breath sounds normal. No respiratory distress.  Musculoskeletal: Normal range of motion.  Neurological: He is alert and oriented to person, place, and time.  Skin: Skin is intact. No rash noted.  Psychiatric: He has a normal  mood and affect. His speech is normal and behavior is normal. Judgment and thought content normal. Cognition and memory are normal.    Results for orders placed or performed in visit on 11/29/15  CBC with Differential/Platelet  Result Value Ref Range   WBC 4.6 3.4 - 10.8 x10E3/uL   RBC 4.73 4.14 - 5.80 x10E6/uL   Hemoglobin 14.7 12.6 - 17.7 g/dL   Hematocrit 42.2 37.5 - 51.0 %   MCV 89 79 - 97 fL   MCH 31.1 26.6 - 33.0 pg   MCHC 34.8 31.5 - 35.7 g/dL   RDW 14.2 12.3 - 15.4 %   Platelets 240 150 - 379 x10E3/uL   Neutrophils 54 %   Lymphs 32 %   Monocytes 12 %   Eos 2 %   Basos 0 %   Neutrophils Absolute 2.5 1.4 - 7.0 x10E3/uL   Lymphocytes Absolute 1.5 0.7 - 3.1 x10E3/uL   Monocytes Absolute 0.6 0.1 - 0.9 x10E3/uL   EOS (ABSOLUTE) 0.1 0.0 - 0.4 x10E3/uL   Basophils Absolute 0.0 0.0 - 0.2 x10E3/uL   Immature Granulocytes 0 %   Immature Grans (Abs) 0.0 0.0 - 0.1 x10E3/uL  Comprehensive metabolic panel  Result Value Ref Range   Glucose 105 (H) 65 - 99 mg/dL   BUN 7 (L) 8 - 27 mg/dL   Creatinine, Ser 1.07 0.76 - 1.27 mg/dL   GFR calc non Af Amer 73 >59 mL/min/1.73  GFR calc Af Amer 85 >59 mL/min/1.73   BUN/Creatinine Ratio 7 (L) 10 - 22   Sodium 141 134 - 144 mmol/L   Potassium 4.0 3.5 - 5.2 mmol/L   Chloride 97 96 - 106 mmol/L   CO2 25 18 - 29 mmol/L   Calcium 9.8 8.6 - 10.2 mg/dL   Total Protein 7.3 6.0 - 8.5 g/dL   Albumin 4.4 3.6 - 4.8 g/dL   Globulin, Total 2.9 1.5 - 4.5 g/dL   Albumin/Globulin Ratio 1.5 1.1 - 2.5   Bilirubin Total 1.5 (H) 0.0 - 1.2 mg/dL   Alkaline Phosphatase 129 (H) 39 - 117 IU/L   AST 13 0 - 40 IU/L   ALT 13 0 - 44 IU/L  Lipid Panel w/o Chol/HDL Ratio  Result Value Ref Range   Cholesterol, Total 165 100 - 199 mg/dL   Triglycerides 83 0 - 149 mg/dL   HDL 48 >39 mg/dL   VLDL Cholesterol Cal 17 5 - 40 mg/dL   LDL Calculated 100 (H) 0 - 99 mg/dL  PSA  Result Value Ref Range   Prostate Specific Ag, Serum 0.5 0.0 - 4.0 ng/mL  TSH  Result  Value Ref Range   TSH 0.572 0.450 - 4.500 uIU/mL  Urinalysis, Routine w reflex microscopic (not at [facility])  Result Value Ref Range   Specific Gravity, UA 1.010 1.005 - 1.030   pH, UA 6.5 5.0 - 7.5   Color, UA Yellow Yellow   Appearance Ur Clear Clear   Leukocytes, UA Negative Negative   Protein, UA Negative Negative/Trace   Glucose, UA Negative Negative   Ketones, UA Negative Negative   RBC, UA Negative Negative   Bilirubin, UA Negative Negative   Urobilinogen, Ur 1.0 0.2 - 1.0 mg/dL   Nitrite, UA Negative Negative  Hepatitis C Antibody  Result Value Ref Range   Hep C Virus Ab <0.1 0.0 - 0.9 s/co ratio  HIV antibody  Result Value Ref Range   HIV Screen 4th Generation wRfx Non Reactive Non Reactive      Assessment & Plan:   Problem List Items Addressed This Visit      Cardiovascular and Mediastinum   Hypertension    The current medical regimen is effective;  continue present plan and medications.       Relevant Orders   Basic metabolic panel   LP+ALT+AST Piccolo, Waived     Other   Hyperlipidemia - Primary    The current medical regimen is effective;  continue present plan and medications.       Relevant Orders   Basic metabolic panel   LP+ALT+AST Piccolo, Waived    Other Visit Diagnoses   None.      Follow up plan: Return in about 6 months (around 12/01/2016) for Physical Exam.

## 2016-06-04 ENCOUNTER — Encounter: Payer: Self-pay | Admitting: Family Medicine

## 2016-06-04 LAB — BASIC METABOLIC PANEL
BUN/Creatinine Ratio: 9 — ABNORMAL LOW (ref 10–24)
BUN: 8 mg/dL (ref 8–27)
CALCIUM: 9.5 mg/dL (ref 8.6–10.2)
CHLORIDE: 98 mmol/L (ref 96–106)
CO2: 27 mmol/L (ref 18–29)
Creatinine, Ser: 0.94 mg/dL (ref 0.76–1.27)
GFR calc Af Amer: 99 mL/min/{1.73_m2} (ref >59)
GFR, EST NON AFRICAN AMERICAN: 85 mL/min/{1.73_m2} (ref >59)
GLUCOSE: 96 mg/dL (ref 65–99)
POTASSIUM: 4.1 mmol/L (ref 3.5–5.2)
Sodium: 141 mmol/L (ref 134–144)

## 2016-06-19 ENCOUNTER — Encounter: Payer: Self-pay | Admitting: Family Medicine

## 2016-08-21 ENCOUNTER — Telehealth: Payer: Self-pay | Admitting: Family Medicine

## 2016-08-21 NOTE — Telephone Encounter (Signed)
Contacted pharmacy, Rx was written in March 2017 #90 w/ 4 refills. Pharmacy stated they had that on file, he must have used the old Rx#. They'd refill it now.   Patient notified.

## 2016-08-21 NOTE — Telephone Encounter (Signed)
Pt would like a refill for simvastatin (ZOCOR) 40 MG tablet sent to [facility] [address].

## 2016-12-02 ENCOUNTER — Ambulatory Visit: Payer: BLUE CROSS/BLUE SHIELD | Admitting: Family Medicine

## 2017-04-16 ENCOUNTER — Encounter: Payer: BLUE CROSS/BLUE SHIELD | Admitting: Family Medicine

## 2017-04-28 ENCOUNTER — Encounter: Payer: BLUE CROSS/BLUE SHIELD | Admitting: Family Medicine

## 2017-05-13 ENCOUNTER — Ambulatory Visit (INDEPENDENT_AMBULATORY_CARE_PROVIDER_SITE_OTHER): Payer: BLUE CROSS/BLUE SHIELD | Admitting: Family Medicine

## 2017-05-13 ENCOUNTER — Encounter: Payer: Self-pay | Admitting: Family Medicine

## 2017-05-13 VITALS — BP 122/81 | HR 73 | Ht 76.77 in | Wt 239.0 lb

## 2017-05-13 DIAGNOSIS — Z0000 Encounter for general adult medical examination without abnormal findings: Secondary | ICD-10-CM

## 2017-05-13 DIAGNOSIS — R252 Cramp and spasm: Secondary | ICD-10-CM | POA: Insufficient documentation

## 2017-05-13 DIAGNOSIS — Z1329 Encounter for screening for other suspected endocrine disorder: Secondary | ICD-10-CM

## 2017-05-13 DIAGNOSIS — I10 Essential (primary) hypertension: Secondary | ICD-10-CM | POA: Diagnosis not present

## 2017-05-13 DIAGNOSIS — Z131 Encounter for screening for diabetes mellitus: Secondary | ICD-10-CM

## 2017-05-13 DIAGNOSIS — E785 Hyperlipidemia, unspecified: Secondary | ICD-10-CM | POA: Diagnosis not present

## 2017-05-13 DIAGNOSIS — Z125 Encounter for screening for malignant neoplasm of prostate: Secondary | ICD-10-CM

## 2017-05-13 DIAGNOSIS — E7800 Pure hypercholesterolemia, unspecified: Secondary | ICD-10-CM | POA: Diagnosis not present

## 2017-05-13 DIAGNOSIS — Z23 Encounter for immunization: Secondary | ICD-10-CM

## 2017-05-13 DIAGNOSIS — Z0001 Encounter for general adult medical examination with abnormal findings: Secondary | ICD-10-CM | POA: Diagnosis not present

## 2017-05-13 DIAGNOSIS — Z1211 Encounter for screening for malignant neoplasm of colon: Secondary | ICD-10-CM

## 2017-05-13 LAB — MICROSCOPIC EXAMINATION: BACTERIA UA: NONE SEEN

## 2017-05-13 LAB — URINALYSIS, ROUTINE W REFLEX MICROSCOPIC
BILIRUBIN UA: NEGATIVE
Glucose, UA: NEGATIVE
KETONES UA: NEGATIVE
LEUKOCYTES UA: NEGATIVE
Nitrite, UA: NEGATIVE
PROTEIN UA: NEGATIVE
RBC UA: NEGATIVE
SPEC GRAV UA: 1.015 (ref 1.005–1.030)
Urobilinogen, Ur: 1 mg/dL (ref 0.2–1.0)
pH, UA: 5.5 (ref 5.0–7.5)

## 2017-05-13 MED ORDER — HYDROCHLOROTHIAZIDE 25 MG PO TABS: 25.0000 mg | ORAL_TABLET | Freq: Every day | ORAL | 4 refills | Status: DC

## 2017-05-13 MED ORDER — SIMVASTATIN 40 MG PO TABS: 40.0000 mg | ORAL_TABLET | Freq: Every day | ORAL | 4 refills | Status: DC

## 2017-05-13 NOTE — Assessment & Plan Note (Signed)
The current medical regimen is effective;  continue present plan and medications.  

## 2017-05-13 NOTE — Progress Notes (Signed)
BP 122/81   Pulse 73   Ht 6' 4.77" (1.95 m)   Wt 239 lb (108.4 kg)   SpO2 98%   BMI 28.51 kg/m    Subjective:    Patient ID: Ryan Day, male    DOB: 09-03-1951, 66 y.o.   MRN: [id number]  HPI: Ryan Day is a 66 y.o. male  Chief Complaint  Patient presents with  . Annual Exam  Patient all in all doing well taking hydrochlorothiazide for blood pressure with good control. Simvastatin for cholesterol taking without side effects or problems. Takes aspirin every day without problems  Patient also has some right posterior thigh cramping type sensations sometimes comes on with exertion like walking goes away with rest more claudication-type symptoms. Sometimes comes on when riding in a car or either bending over with some radicular type symptoms. Patient with no complaints of back pain.  Relevant past medical, surgical, family and social history reviewed and updated as indicated. Interim medical history since our last visit reviewed. Allergies and medications reviewed and updated.  Review of Systems  Constitutional: Negative.   HENT: Negative.   Eyes: Negative.   Respiratory: Negative.   Cardiovascular: Negative.   Gastrointestinal: Negative.   Endocrine: Negative.   Genitourinary: Negative.   Musculoskeletal: Negative.   Skin: Negative.   Allergic/Immunologic: Negative.   Neurological: Negative.   Hematological: Negative.   Psychiatric/Behavioral: Negative.     Per HPI unless specifically indicated above     Objective:    BP 122/81   Pulse 73   Ht 6' 4.77" (1.95 m)   Wt 239 lb (108.4 kg)   SpO2 98%   BMI 28.51 kg/m   Wt Readings from Last 3 Encounters:  05/13/17 239 lb (108.4 kg)  06/03/16 250 lb (113.4 kg)  11/29/15 247 lb (112 kg)    Physical Exam  Constitutional: He is oriented to person, place, and time. He appears well-developed and well-nourished.  HENT:  Head: Normocephalic and atraumatic.  Right Ear: External ear normal.  Left Ear: External  ear normal.  Eyes: Pupils are equal, round, and reactive to light. Conjunctivae and EOM are normal.  Neck: Normal range of motion. Neck supple.  Cardiovascular: Normal rate, regular rhythm, normal heart sounds and intact distal pulses.   Pulmonary/Chest: Effort normal and breath sounds normal.  Abdominal: Soft. Bowel sounds are normal. There is no splenomegaly or hepatomegaly.  Genitourinary: Rectum normal, prostate normal and penis normal.  Musculoskeletal: Normal range of motion.  Diminished anterior tibial pulses both legs  Neurological: He is alert and oriented to person, place, and time. He has normal reflexes.  Skin: No rash noted. No erythema.  Scratch on right shin healing well will give tetanus shot  Psychiatric: He has a normal mood and affect. His behavior is normal. Judgment and thought content normal.    Results for orders placed or performed in visit on 40/98/11  Basic metabolic panel  Result Value Ref Range   Glucose 96 65 - 99 mg/dL   BUN 8 8 - 27 mg/dL   Creatinine, Ser 0.94 0.76 - 1.27 mg/dL   GFR calc non Af Amer 85 >59 mL/min/1.73   GFR calc Af Amer 99 >59 mL/min/1.73   BUN/Creatinine Ratio 9 (L) 10 - 24   Sodium 141 134 - 144 mmol/L   Potassium 4.1 3.5 - 5.2 mmol/L   Chloride 98 96 - 106 mmol/L   CO2 27 18 - 29 mmol/L   Calcium 9.5 8.6 - 10.2 mg/dL  LP+ALT+AST Piccolo, [employer]  Result Value Ref Range   ALT (SGPT) Piccolo, Waived 12 10 - 47 U/L   AST (SGOT) Piccolo, Waived 20 11 - 38 U/L   Cholesterol Piccolo, Waived 185 <200 mg/dL   HDL Chol Piccolo, Waived 61 >59 mg/dL   Triglycerides Piccolo,Waived 79 <150 mg/dL   Chol/HDL Ratio Piccolo,Waive 3.0 mg/dL   LDL Chol Calc Piccolo Waived 108 (H) <100 mg/dL   VLDL Chol Calc Piccolo,Waive 16 <30 mg/dL      Assessment & Plan:   Problem List Items Addressed This Visit      Cardiovascular and Mediastinum   Hypertension    The current medical regimen is effective;  continue present plan and medications.         Relevant Medications   hydrochlorothiazide (HYDRODIURIL) 25 MG tablet   simvastatin (ZOCOR) 40 MG tablet   Other Relevant Orders   CBC with Differential/Platelet     Other   Hyperlipidemia    The current medical regimen is effective;  continue present plan and medications.       Relevant Medications   hydrochlorothiazide (HYDRODIURIL) 25 MG tablet   simvastatin (ZOCOR) 40 MG tablet   Other Relevant Orders   Lipid panel   Exercise-induced leg cramps    Patient with exercise induced leg cramp basically in his right leg will refer to vascular to further evaluate also diminished anterior tibialis pulses.      Relevant Orders   Ambulatory referral to Vascular Surgery    Other Visit Diagnoses    Routine general medical examination at a health care facility    -  Primary   Screening for diabetes mellitus (DM)       Relevant Orders   Comprehensive metabolic panel   Urinalysis, Routine w reflex microscopic   Thyroid disorder screen       Relevant Orders   TSH   Prostate cancer screening       Relevant Orders   PSA   Need for Tdap vaccination       Relevant Orders   Td : Tetanus/diphtheria >7yo Preservative  free (Completed)   Colon cancer screening       Relevant Orders   Ambulatory referral to Gastroenterology       Follow up plan: Return for BMP,  Lipids, ALT, AST.

## 2017-05-13 NOTE — Assessment & Plan Note (Signed)
Patient with exercise induced leg cramp basically in his right leg will refer to vascular to further evaluate also diminished anterior tibialis pulses.

## 2017-05-14 ENCOUNTER — Encounter: Payer: Self-pay | Admitting: Family Medicine

## 2017-05-14 LAB — COMPREHENSIVE METABOLIC PANEL
ALBUMIN: 4.5 g/dL (ref 3.6–4.8)
ALT: 11 IU/L (ref 0–44)
AST: 18 IU/L (ref 0–40)
Albumin/Globulin Ratio: 1.5 (ref 1.2–2.2)
Alkaline Phosphatase: 131 IU/L — ABNORMAL HIGH (ref 39–117)
BILIRUBIN TOTAL: 1.1 mg/dL (ref 0.0–1.2)
BUN/Creatinine Ratio: 8 — ABNORMAL LOW (ref 10–24)
BUN: 9 mg/dL (ref 8–27)
CALCIUM: 10.2 mg/dL (ref 8.6–10.2)
CHLORIDE: 98 mmol/L (ref 96–106)
CO2: 23 mmol/L (ref 20–29)
CREATININE: 1.1 mg/dL (ref 0.76–1.27)
GFR calc non Af Amer: 70 mL/min/{1.73_m2} (ref >59)
GFR, EST AFRICAN AMERICAN: 81 mL/min/{1.73_m2} (ref >59)
GLUCOSE: 101 mg/dL — AB (ref 65–99)
Globulin, Total: 3.1 g/dL (ref 1.5–4.5)
Potassium: 3.6 mmol/L (ref 3.5–5.2)
Sodium: 142 mmol/L (ref 134–144)
TOTAL PROTEIN: 7.6 g/dL (ref 6.0–8.5)

## 2017-05-14 LAB — LIPID PANEL
Chol/HDL Ratio: 3.2 ratio (ref 0.0–5.0)
Cholesterol, Total: 165 mg/dL (ref 100–199)
HDL: 51 mg/dL (ref >39)
LDL CALC: 95 mg/dL (ref 0–99)
Triglycerides: 97 mg/dL (ref 0–149)
VLDL CHOLESTEROL CAL: 19 mg/dL (ref 5–40)

## 2017-05-14 LAB — CBC WITH DIFFERENTIAL/PLATELET
BASOS ABS: 0 10*3/uL (ref 0.0–0.2)
Basos: 0 %
EOS (ABSOLUTE): 0.2 10*3/uL (ref 0.0–0.4)
Eos: 3 %
HEMOGLOBIN: 15 g/dL (ref 13.0–17.7)
Hematocrit: 42.8 % (ref 37.5–51.0)
IMMATURE GRANS (ABS): 0 10*3/uL (ref 0.0–0.1)
IMMATURE GRANULOCYTES: 0 %
LYMPHS: 33 %
Lymphocytes Absolute: 1.7 10*3/uL (ref 0.7–3.1)
MCH: 30.8 pg (ref 26.6–33.0)
MCHC: 35 g/dL (ref 31.5–35.7)
MCV: 88 fL (ref 79–97)
MONOCYTES: 12 %
Monocytes Absolute: 0.6 10*3/uL (ref 0.1–0.9)
NEUTROS PCT: 52 %
Neutrophils Absolute: 2.6 10*3/uL (ref 1.4–7.0)
PLATELETS: 268 10*3/uL (ref 150–379)
RBC: 4.87 x10E6/uL (ref 4.14–5.80)
RDW: 14 % (ref 12.3–15.4)
WBC: 5.1 10*3/uL (ref 3.4–10.8)

## 2017-05-14 LAB — TSH: TSH: 0.677 u[IU]/mL (ref 0.450–4.500)

## 2017-05-14 LAB — PSA: PROSTATE SPECIFIC AG, SERUM: 0.4 ng/mL (ref 0.0–4.0)

## 2017-06-09 ENCOUNTER — Encounter (INDEPENDENT_AMBULATORY_CARE_PROVIDER_SITE_OTHER): Payer: Self-pay | Admitting: Vascular Surgery

## 2017-06-13 ENCOUNTER — Encounter (INDEPENDENT_AMBULATORY_CARE_PROVIDER_SITE_OTHER): Payer: Self-pay | Admitting: Vascular Surgery

## 2017-06-13 ENCOUNTER — Ambulatory Visit (INDEPENDENT_AMBULATORY_CARE_PROVIDER_SITE_OTHER): Payer: BLUE CROSS/BLUE SHIELD | Admitting: Vascular Surgery

## 2017-06-13 VITALS — BP 121/72 | HR 62 | Resp 16 | Ht 77.0 in | Wt 239.0 lb

## 2017-06-13 DIAGNOSIS — R252 Cramp and spasm: Secondary | ICD-10-CM

## 2017-06-13 DIAGNOSIS — E785 Hyperlipidemia, unspecified: Secondary | ICD-10-CM | POA: Diagnosis not present

## 2017-06-13 DIAGNOSIS — I10 Essential (primary) hypertension: Secondary | ICD-10-CM | POA: Diagnosis not present

## 2017-06-13 NOTE — Progress Notes (Signed)
Subjective:    Patient ID: Ryan Day, male    DOB: [birth year], 66 y.o.   MRN: [id number] Chief Complaint  Patient presents with  . New Patient (Initial Visit)    Leg cramps, diminshed pulses   Presents at the request of Dr. Freddi Starr for possible peripheral artery disease. Patient endorses a history of experiencing right thigh cramping for approximately 3-4 months. Informs that his cramping severity has not worsened. States the cramping occurs if he bends over or if he is in the seated position for long periods of time. Denies any rest pain or ulcerations or lower extremity. Denies any back pain. Denies any fever, nausea or vomiting.   Review of Systems  Constitutional: Negative.   HENT: Negative.   Eyes: Negative.   Respiratory: Negative.   Cardiovascular:       Right lower extremity thigh cramping  Gastrointestinal: Negative.   Endocrine: Negative.   Genitourinary: Negative.   Musculoskeletal: Negative.   Skin: Negative.   Allergic/Immunologic: Negative.   Neurological: Negative.   Hematological: Negative.   Psychiatric/Behavioral: Negative.       Objective:   Physical Exam  Constitutional: He is oriented to person, place, and time. He appears well-developed and well-nourished. No distress.  HENT:  Head: Normocephalic and atraumatic.  Eyes: Pupils are equal, round, and reactive to light. Conjunctivae are normal.  Neck: Normal range of motion.  Cardiovascular: Normal rate, regular rhythm, normal heart sounds and intact distal pulses.   Pulses:      Radial pulses are 2+ on the right side, and 2+ on the left side.       Dorsalis pedis pulses are 1+ on the right side, and 1+ on the left side.       Posterior tibial pulses are 1+ on the right side, and 1+ on the left side.  Pulmonary/Chest: Effort normal.  Musculoskeletal: Normal range of motion. He exhibits no edema.  Neurological: He is alert and oriented to person, place, and time.  Skin: Skin is warm and dry. He is not  diaphoretic.  Psychiatric: He has a normal mood and affect. His behavior is normal. Judgment and thought content normal.  Vitals reviewed.  BP 121/72 (BP Location: Right Arm)   Pulse 62   Resp 16   Ht 6\' 5"  (1.956 m)   Wt 239 lb (108.4 kg)   BMI 28.34 kg/m   Past Medical History:  Diagnosis Date  . Elevated alkaline phosphatase level   . 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 Hyperlipidemia   . Hypertension     Social History   Social History  . Marital status: Married    Spouse name: N/A  . Number of children: N/A  . Years of education: N/A   Occupational History  . Not on file.   Social History Main Topics  . Smoking status: Never Smoker  . Smokeless tobacco: Never Used  . Alcohol use Yes     Comment: 1 or less  . Drug use: No  . Sexual activity: Not on file   Other Topics Concern  . Not on file   Social History Narrative  . No narrative on file    No past surgical history on file.  Family History  Problem Relation Age of Onset  . Cancer Father        lung  . Cancer Brother        throat  . Heart disease Brother 63    No Known Allergies  Assessment & Plan:  Presents at the request of Dr. Freddi Starr for possible peripheral artery disease. Patient endorses a history of experiencing right thigh cramping for approximately 3-4 months. Informs that his cramping severity has not worsened. States the cramping occurs if he bends over or if he is in the seated position for long periods of time. Denies any rest pain or ulcerations or lower extremity. Denies any back pain. Denies any fever, nausea or vomiting.  1. Exercise-induced leg cramps - New Patient with what sounds a possible claudication to the right thigh vs radicular type symptoms. Patient with multiple risk factors for PAD We will order an ABI at the patient's convenience to rule out any  contributing PAD. I have discussed with the patient at length the risk factors for and pathogenesis  of atherosclerotic disease and encouraged a healthy diet, regular exercise regimen and blood pressure / glucose control.  The patient was encouraged to call the office in the interim if he experiences any claudication like symptoms, rest pain or ulcers to his feet / toes.  - VAS [place] ABI WITH/WO TBI; Future  2. Hyperlipidemia, unspecified hyperlipidemia type - Stable Encouraged good control as its slows the progression of atherosclerotic disease  3. Essential hypertension - Stable Encouraged good control as its slows the progression of atherosclerotic disease  Current Outpatient Prescriptions on File Prior to Visit  Medication Sig Dispense Refill  . aspirin 81 MG chewable tablet Chew 81 mg by mouth daily.    . cetirizine (ZYRTEC) 10 MG tablet Take 10 mg by mouth daily as needed.     . hydrochlorothiazide (HYDRODIURIL) 25 MG tablet Take 1 tablet (25 mg total) by mouth daily. 90 tablet 4  . simvastatin (ZOCOR) 40 MG tablet Take 1 tablet (40 mg total) by mouth daily. 90 tablet 4   No current facility-administered medications on file prior to visit.     There are no Patient Instructions on file for this visit. No Follow-up on file.   Laelynn Blizzard A Macala Baldonado, PA-C

## 2017-07-04 ENCOUNTER — Encounter (INDEPENDENT_AMBULATORY_CARE_PROVIDER_SITE_OTHER): Payer: Self-pay | Admitting: Vascular Surgery

## 2017-07-04 ENCOUNTER — Ambulatory Visit (INDEPENDENT_AMBULATORY_CARE_PROVIDER_SITE_OTHER): Payer: BLUE CROSS/BLUE SHIELD | Admitting: Vascular Surgery

## 2017-07-04 ENCOUNTER — Ambulatory Visit (INDEPENDENT_AMBULATORY_CARE_PROVIDER_SITE_OTHER): Payer: BLUE CROSS/BLUE SHIELD

## 2017-07-04 VITALS — BP 140/81 | HR 67 | Resp 17 | Ht 77.0 in | Wt 237.0 lb

## 2017-07-04 DIAGNOSIS — I10 Essential (primary) hypertension: Secondary | ICD-10-CM | POA: Diagnosis not present

## 2017-07-04 DIAGNOSIS — R252 Cramp and spasm: Secondary | ICD-10-CM

## 2017-07-04 DIAGNOSIS — E785 Hyperlipidemia, unspecified: Secondary | ICD-10-CM | POA: Diagnosis not present

## 2017-07-04 NOTE — Assessment & Plan Note (Signed)
His noninvasive studies today demonstrate normal triphasic waveforms throughout both lower extremities with normal digital pressures of greater than 100  bilaterally.  His right ABI is 1.3 and his left ABI is 1.2.  These findings are consistent with no arterial insufficiency. It does not appears that his lower extremity symptoms are secondary to poor perfusion.  We discussed the musculoskeletal or neurogenic causes are likely to blame.  I will see the patient back on an as-needed basis

## 2017-07-04 NOTE — Assessment & Plan Note (Signed)
lipid control important in reducing the progression of atherosclerotic disease. Continue statin therapy  

## 2017-07-04 NOTE — Assessment & Plan Note (Signed)
blood pressure control important in reducing the progression of atherosclerotic disease. On appropriate oral medications.  

## 2017-07-04 NOTE — Progress Notes (Signed)
MRN : [id number]  Ryan Day is a 66 y.o. (11-19-1950) male who presents with chief complaint of  Chief Complaint  Patient presents with  . Follow-up    ABI u/s f/u  .  History of Present Illness: Patient returns today in follow up of leg pain.  His right leg pain with activity has not really changed or worsened.  He has no other complaints today.  His noninvasive studies today demonstrate normal triphasic waveforms throughout both lower extremities with normal digital pressures of greater than 100  bilaterally.  His right ABI is 1.3 and his left ABI is 1.2.  These findings are consistent with no arterial insufficiency.  Current Outpatient Prescriptions  Medication Sig Dispense Refill  . aspirin 81 MG chewable tablet Chew 81 mg by mouth daily.    . cetirizine (ZYRTEC) 10 MG tablet Take 10 mg by mouth daily as needed.     . hydrochlorothiazide (HYDRODIURIL) 25 MG tablet Take 1 tablet (25 mg total) by mouth daily. 90 tablet 4  . simvastatin (ZOCOR) 40 MG tablet Take 1 tablet (40 mg total) by mouth daily. 90 tablet 4   No current facility-administered medications for this visit.     Past Medical History:  Diagnosis Date  . Elevated alkaline phosphatase level   . 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 Hyperlipidemia   . Hypertension     No past surgical history on file.  Social History Social History  Substance Use Topics  . Smoking status: Never Smoker  . Smokeless tobacco: Never Used  . Alcohol use Yes     Comment: 1 or less      Family History Family History  Problem Relation Age of Onset  . Cancer Father        lung  . Cancer Brother        throat  . Heart disease Brother 30     No Known Allergies   REVIEW OF SYSTEMS (Negative unless checked)  Constitutional: [] Weight loss  [] Fever  [] Chills Cardiac: [] Chest pain   [] Chest pressure   [] Palpitations   [] Shortness of breath when laying flat   [] Shortness of breath at rest   [] Shortness of breath  with exertion. Vascular:  [x] Pain in legs with walking   [] Pain in legs at rest   [] Pain in legs when laying flat   [x] Claudication   [] Pain in feet when walking  [] Pain in feet at rest  [] Pain in feet when laying flat   [] History of DVT   [] Phlebitis   [] Swelling in legs   [] Varicose veins   [] Non-healing ulcers Pulmonary:   [] Uses home oxygen   [] Productive cough   [] Hemoptysis   [] Wheeze  [] COPD   [] Asthma Neurologic:  [] Dizziness  [] Blackouts   [] Seizures   [] History of stroke   [] History of TIA  [] Aphasia   [] Temporary blindness   [] Dysphagia   [] Weakness or numbness in arms   [] Weakness or numbness in legs Musculoskeletal:  [] Arthritis   [] Joint swelling   [] Joint pain   [] Low back pain Hematologic:  [] Easy bruising  [] Easy bleeding   [] Hypercoagulable state   [] Anemic   Gastrointestinal:  [] Blood in stool   [] Vomiting blood  [] Gastroesophageal reflux/heartburn   [] Abdominal pain Genitourinary:  [] Chronic kidney disease   [] Difficult urination  [] Frequent urination  [] Burning with urination   [] Hematuria Skin:  [] Rashes   [] Ulcers   [] Wounds Psychological:  [] History of anxiety   []  History of major depression.  Physical Examination  BP 140/81 (BP Location: Right Arm)   Pulse 67   Resp 17   Ht 6\' 5"  (1.956 m)   Wt 107.5 kg (237 lb)   BMI 28.10 kg/m  Gen:  WD/WN, NAD. Tall and fit appearing. Head: [place]/AT, No temporalis wasting. Ear/Nose/Throat: Hearing grossly intact, nares w/o erythema or drainage, trachea midline Eyes: Conjunctiva clear. Sclera non-icteric Neck: Supple.  No JVD.  Pulmonary:  Good air movement, no use of accessory muscles.  Cardiac: RRR, normal S1, S2 Vascular:  Vessel Right Left  Radial Palpable Palpable                      Popliteal Palpable Palpable  PT 1+ Palpable 1+ Palpable  DP Palpable Palpable    Musculoskeletal: M/S 5/5 throughout.  No deformity or atrophy Neurologic: Sensation grossly intact in extremities.  Symmetrical.  Speech is fluent.    Psychiatric: Judgment intact, Mood & affect appropriate for pt's clinical situation. Dermatologic: No rashes or ulcers noted.  No cellulitis or open wounds.       Labs Recent Results (from the past 2160 hour(s))  CBC with Differential/Platelet     Status: None   Collection Time: 05/13/17  2:18 PM  Result Value Ref Range   WBC 5.1 3.4 - 10.8 x10E3/uL   RBC 4.87 4.14 - 5.80 x10E6/uL   Hemoglobin 15.0 13.0 - 17.7 g/dL   Hematocrit 42.8 37.5 - 51.0 %   MCV 88 79 - 97 fL   MCH 30.8 26.6 - 33.0 pg   MCHC 35.0 31.5 - 35.7 g/dL   RDW 14.0 12.3 - 15.4 %   Platelets 268 150 - 379 x10E3/uL   Neutrophils 52 Not Estab. %   Lymphs 33 Not Estab. %   Monocytes 12 Not Estab. %   Eos 3 Not Estab. %   Basos 0 Not Estab. %   Neutrophils Absolute 2.6 1.4 - 7.0 x10E3/uL   Lymphocytes Absolute 1.7 0.7 - 3.1 x10E3/uL   Monocytes Absolute 0.6 0.1 - 0.9 x10E3/uL   EOS (ABSOLUTE) 0.2 0.0 - 0.4 x10E3/uL   Basophils Absolute 0.0 0.0 - 0.2 x10E3/uL   Immature Granulocytes 0 Not Estab. %   Immature Grans (Abs) 0.0 0.0 - 0.1 x10E3/uL  Comprehensive metabolic panel     Status: Abnormal   Collection Time: 05/13/17  2:18 PM  Result Value Ref Range   Glucose 101 (H) 65 - 99 mg/dL   BUN 9 8 - 27 mg/dL   Creatinine, Ser 1.10 0.76 - 1.27 mg/dL   GFR calc non Af Amer 70 >59 mL/min/1.73   GFR calc Af Amer 81 >59 mL/min/1.73   BUN/Creatinine Ratio 8 (L) 10 - 24   Sodium 142 134 - 144 mmol/L   Potassium 3.6 3.5 - 5.2 mmol/L   Chloride 98 96 - 106 mmol/L   CO2 23 20 - 29 mmol/L   Calcium 10.2 8.6 - 10.2 mg/dL   Total Protein 7.6 6.0 - 8.5 g/dL   Albumin 4.5 3.6 - 4.8 g/dL   Globulin, Total 3.1 1.5 - 4.5 g/dL   Albumin/Globulin Ratio 1.5 1.2 - 2.2   Bilirubin Total 1.1 0.0 - 1.2 mg/dL   Alkaline Phosphatase 131 (H) 39 - 117 IU/L   AST 18 0 - 40 IU/L   ALT 11 0 - 44 IU/L  Lipid panel     Status: None   Collection Time: 05/13/17  2:18 PM  Result Value Ref Range   Cholesterol, Total  165 100 - 199 mg/dL    Triglycerides 97 0 - 149 mg/dL   HDL 51 >39 mg/dL   VLDL Cholesterol Cal 19 5 - 40 mg/dL   LDL Calculated 95 0 - 99 mg/dL   Chol/HDL Ratio 3.2 0.0 - 5.0 ratio    Comment:                                   T. Chol/HDL Ratio                                             Men  Women                               1/2 Avg.Risk  3.4    3.3                                   Avg.Risk  5.0    4.4                                2X Avg.Risk  9.6    7.1                                3X Avg.Risk 23.4   11.0   PSA     Status: None   Collection Time: 05/13/17  2:18 PM  Result Value Ref Range   Prostate Specific Ag, Serum 0.4 0.0 - 4.0 ng/mL    Comment: Roche ECLIA methodology. According to the American Urological Association, Serum PSA should decrease and remain at undetectable levels after radical prostatectomy. The AUA defines biochemical recurrence as an initial PSA value 0.2 ng/mL or greater followed by a subsequent confirmatory PSA value 0.2 ng/mL or greater. Values obtained with different assay methods or kits cannot be used interchangeably. Results cannot be interpreted as absolute evidence of the presence or absence of malignant disease.   TSH     Status: None   Collection Time: 05/13/17  2:18 PM  Result Value Ref Range   TSH 0.677 0.450 - 4.500 uIU/mL  Urinalysis, Routine w reflex microscopic     Status: None   Collection Time: 05/13/17  2:18 PM  Result Value Ref Range   Specific Gravity, UA 1.015 1.005 - 1.030   pH, UA 5.5 5.0 - 7.5   Color, UA Yellow Yellow   Appearance Ur Clear Clear   Leukocytes, UA Negative Negative   Protein, UA Negative Negative/Trace   Glucose, UA Negative Negative   Ketones, UA Negative Negative   RBC, UA Negative Negative   Bilirubin, UA Negative Negative   Urobilinogen, Ur 1.0 0.2 - 1.0 mg/dL   Nitrite, UA Negative Negative   Microscopic Examination See below:   Microscopic Examination     Status: None   Collection Time: 05/13/17  2:18 PM  Result  Value Ref Range   WBC, UA 0-5 0 - 5 /hpf   RBC, UA 0-2 0 - 2 /hpf   Epithelial Cells (non renal) 0-10 0 - 10 /hpf   Bacteria, UA None  seen None seen/Few    Radiology No results found.   Assessment/Plan  Hyperlipidemia lipid control important in reducing the progression of atherosclerotic disease. Continue statin therapy   Hypertension blood pressure control important in reducing the progression of atherosclerotic disease. On appropriate oral medications.   Exercise-induced leg cramps His noninvasive studies today demonstrate normal triphasic waveforms throughout both lower extremities with normal digital pressures of greater than 100  bilaterally.  His right ABI is 1.3 and his left ABI is 1.2.  These findings are consistent with no arterial insufficiency. It does not appears that his lower extremity symptoms are secondary to poor perfusion.  We discussed the musculoskeletal or neurogenic causes are likely to blame.  I will see the patient back on an as-needed basis    Leotis Pain, MD  07/04/2017 4:16 PM    This note was created with Dragon medical transcription system.  Any errors from dictation are purely unintentional

## 2017-09-22 ENCOUNTER — Telehealth: Payer: Self-pay | Admitting: Family Medicine

## 2017-09-22 NOTE — Telephone Encounter (Signed)
[place], to see if pt had refills available. Pharmacy states that the pt picked up refill on 12/24 and still has refills available.   Left detailed message for pt pt notifying him that refills for both medications were picked up on 12/24 and 90 tabs were dispensed.

## 2017-09-22 NOTE — Telephone Encounter (Signed)
Copied from [place] [id number]. Topic: Quick Communication - Rx Refill/Question >> Sep 22, 2017  9:34 AM Scherrie Gerlach wrote: Has the patient contacted their pharmacy? {yes  but pharmacy said they could not refill (pt should have refills) Pt requesting simvastatin (ZOCOR) 40 MG tablet hydrochlorothiazide (HYDRODIURIL) 25 MG tablet   [facility] ([facility] [place] [facility] [place] [phone] (Phone) [phone] (Fax)  Pt states he is out

## 2017-11-13 ENCOUNTER — Encounter: Payer: Self-pay | Admitting: Family Medicine

## 2017-11-13 ENCOUNTER — Ambulatory Visit: Payer: BLUE CROSS/BLUE SHIELD | Admitting: Family Medicine

## 2017-11-13 VITALS — BP 130/81 | HR 70 | Ht 77.0 in | Wt 224.0 lb

## 2017-11-13 DIAGNOSIS — E785 Hyperlipidemia, unspecified: Secondary | ICD-10-CM | POA: Diagnosis not present

## 2017-11-13 DIAGNOSIS — I10 Essential (primary) hypertension: Secondary | ICD-10-CM

## 2017-11-13 DIAGNOSIS — Z1211 Encounter for screening for malignant neoplasm of colon: Secondary | ICD-10-CM | POA: Diagnosis not present

## 2017-11-13 LAB — LP+ALT+AST PICCOLO, WAIVED
ALT (SGPT) PICCOLO, WAIVED: 25 U/L (ref 10–47)
AST (SGOT) PICCOLO, WAIVED: 23 U/L (ref 11–38)
CHOL/HDL RATIO PICCOLO,WAIVE: 3 mg/dL
CHOLESTEROL PICCOLO, WAIVED: 164 mg/dL (ref <200)
HDL CHOL PICCOLO, WAIVED: 55 mg/dL — AB (ref >59)
LDL Chol Calc Piccolo Waived: 87 mg/dL (ref <100)
TRIGLYCERIDES PICCOLO,WAIVED: 107 mg/dL (ref <150)
VLDL Chol Calc Piccolo,Waive: 21 mg/dL (ref <30)

## 2017-11-13 NOTE — Assessment & Plan Note (Signed)
The current medical regimen is effective;  continue present plan and medications.  

## 2017-11-13 NOTE — Progress Notes (Signed)
BP 130/81   Pulse 70   Ht 6\' 5"  (1.956 m)   Wt 224 lb (101.6 kg)   SpO2 99%   BMI 26.56 kg/m    Subjective:    Patient ID: Ryan Day, male    DOB: December 16, 1950, 67 y.o.   MRN: [id number]  HPI: Ryan Day is a 67 y.o. male  Chief Complaint  Patient presents with  . Follow-up  . Hypertension  . Hyperlipidemia  She is all in all doing well takes simvastatin for cholesterol without problems or issues taken faithfully. Blood pressure hydrochlorothiazide doing the same no issues or concerns takes faithfully with good blood pressure control.   Relevant past medical, surgical, family and social history reviewed and updated as indicated. Interim medical history since our last visit reviewed. Allergies and medications reviewed and updated.  Review of Systems  Constitutional: Negative.   Respiratory: Negative.   Cardiovascular: Negative.     Per HPI unless specifically indicated above     Objective:    BP 130/81   Pulse 70   Ht 6\' 5"  (1.956 m)   Wt 224 lb (101.6 kg)   SpO2 99%   BMI 26.56 kg/m   Wt Readings from Last 3 Encounters:  11/13/17 224 lb (101.6 kg)  07/04/17 237 lb (107.5 kg)  06/13/17 239 lb (108.4 kg)    Physical Exam  Constitutional: He is oriented to person, place, and time. He appears well-developed and well-nourished.  HENT:  Head: Normocephalic and atraumatic.  Eyes: Conjunctivae and EOM are normal.  Neck: Normal range of motion.  Cardiovascular: Normal rate, regular rhythm and normal heart sounds.  Pulmonary/Chest: Effort normal and breath sounds normal.  Musculoskeletal: Normal range of motion.  Neurological: He is alert and oriented to person, place, and time.  Skin: No erythema.  Psychiatric: He has a normal mood and affect. His behavior is normal. Judgment and thought content normal.    Results for orders placed or performed in visit on 05/13/17  Microscopic Examination  Result Value Ref Range   WBC, UA 0-5 0 - 5 /hpf   RBC, UA 0-2 0 -  2 /hpf   Epithelial Cells (non renal) 0-10 0 - 10 /hpf   Bacteria, UA None seen None seen/Few  CBC with Differential/Platelet  Result Value Ref Range   WBC 5.1 3.4 - 10.8 x10E3/uL   RBC 4.87 4.14 - 5.80 x10E6/uL   Hemoglobin 15.0 13.0 - 17.7 g/dL   Hematocrit 42.8 37.5 - 51.0 %   MCV 88 79 - 97 fL   MCH 30.8 26.6 - 33.0 pg   MCHC 35.0 31.5 - 35.7 g/dL   RDW 14.0 12.3 - 15.4 %   Platelets 268 150 - 379 x10E3/uL   Neutrophils 52 Not Estab. %   Lymphs 33 Not Estab. %   Monocytes 12 Not Estab. %   Eos 3 Not Estab. %   Basos 0 Not Estab. %   Neutrophils Absolute 2.6 1.4 - 7.0 x10E3/uL   Lymphocytes Absolute 1.7 0.7 - 3.1 x10E3/uL   Monocytes Absolute 0.6 0.1 - 0.9 x10E3/uL   EOS (ABSOLUTE) 0.2 0.0 - 0.4 x10E3/uL   Basophils Absolute 0.0 0.0 - 0.2 x10E3/uL   Immature Granulocytes 0 Not Estab. %   Immature Grans (Abs) 0.0 0.0 - 0.1 x10E3/uL  Comprehensive metabolic panel  Result Value Ref Range   Glucose 101 (H) 65 - 99 mg/dL   BUN 9 8 - 27 mg/dL   Creatinine, Ser 1.10 0.76 - 1.27  mg/dL   GFR calc non Af Amer 70 >59 mL/min/1.73   GFR calc Af Amer 81 >59 mL/min/1.73   BUN/Creatinine Ratio 8 (L) 10 - 24   Sodium 142 134 - 144 mmol/L   Potassium 3.6 3.5 - 5.2 mmol/L   Chloride 98 96 - 106 mmol/L   CO2 23 20 - 29 mmol/L   Calcium 10.2 8.6 - 10.2 mg/dL   Total Protein 7.6 6.0 - 8.5 g/dL   Albumin 4.5 3.6 - 4.8 g/dL   Globulin, Total 3.1 1.5 - 4.5 g/dL   Albumin/Globulin Ratio 1.5 1.2 - 2.2   Bilirubin Total 1.1 0.0 - 1.2 mg/dL   Alkaline Phosphatase 131 (H) 39 - 117 IU/L   AST 18 0 - 40 IU/L   ALT 11 0 - 44 IU/L  Lipid panel  Result Value Ref Range   Cholesterol, Total 165 100 - 199 mg/dL   Triglycerides 97 0 - 149 mg/dL   HDL 51 >39 mg/dL   VLDL Cholesterol Cal 19 5 - 40 mg/dL   LDL Calculated 95 0 - 99 mg/dL   Chol/HDL Ratio 3.2 0.0 - 5.0 ratio  PSA  Result Value Ref Range   Prostate Specific Ag, Serum 0.4 0.0 - 4.0 ng/mL  TSH  Result Value Ref Range   TSH 0.677  0.450 - 4.500 uIU/mL  Urinalysis, Routine w reflex microscopic  Result Value Ref Range   Specific Gravity, UA 1.015 1.005 - 1.030   pH, UA 5.5 5.0 - 7.5   Color, UA Yellow Yellow   Appearance Ur Clear Clear   Leukocytes, UA Negative Negative   Protein, UA Negative Negative/Trace   Glucose, UA Negative Negative   Ketones, UA Negative Negative   RBC, UA Negative Negative   Bilirubin, UA Negative Negative   Urobilinogen, Ur 1.0 0.2 - 1.0 mg/dL   Nitrite, UA Negative Negative   Microscopic Examination See below:       Assessment & Plan:   Problem List Items Addressed This Visit      Cardiovascular and Mediastinum   Hypertension - Primary    The current medical regimen is effective;  continue present plan and medications.       Relevant Orders   Basic metabolic panel   LP+ALT+AST Piccolo, Waived     Other   Hyperlipidemia    The current medical regimen is effective;  continue present plan and medications.       Relevant Orders   Basic metabolic panel   LP+ALT+AST Piccolo, Waived    Other Visit Diagnoses    Colon cancer screening           Follow up plan: Return in about 6 months (around 05/13/2018) for Physical Exam.

## 2017-11-14 ENCOUNTER — Other Ambulatory Visit: Payer: Self-pay

## 2017-11-14 ENCOUNTER — Telehealth: Payer: Self-pay

## 2017-11-14 DIAGNOSIS — Z1211 Encounter for screening for malignant neoplasm of colon: Secondary | ICD-10-CM

## 2017-11-14 LAB — BASIC METABOLIC PANEL
BUN/Creatinine Ratio: 10 (ref 10–24)
BUN: 11 mg/dL (ref 8–27)
CO2: 28 mmol/L (ref 20–29)
CREATININE: 1.11 mg/dL (ref 0.76–1.27)
Calcium: 9.9 mg/dL (ref 8.6–10.2)
Chloride: 98 mmol/L (ref 96–106)
GFR calc Af Amer: 80 mL/min/{1.73_m2} (ref >59)
GFR calc non Af Amer: 69 mL/min/{1.73_m2} (ref >59)
GLUCOSE: 88 mg/dL (ref 65–99)
POTASSIUM: 3.7 mmol/L (ref 3.5–5.2)
SODIUM: 143 mmol/L (ref 134–144)

## 2017-11-14 NOTE — Telephone Encounter (Signed)
Gastroenterology Pre-Procedure Review  Request Date: 12/05/17 Requesting Physician: Dr. Vicente Males  PATIENT REVIEW QUESTIONS: The patient responded to the following health history questions as indicated:    1. Are you having any GI issues? no 2. Do you have a personal history of Polyps? no 3. Do you have a family history of Colon Cancer or Polyps? no 4. Diabetes Mellitus? no 5. Joint replacements in the past 12 months?no 6. Major health problems in the past 3 months?no 7. Any artificial heart valves, MVP, or defibrillator?no    MEDICATIONS & ALLERGIES:    Patient reports the following regarding taking any anticoagulation/antiplatelet therapy:   Plavix, Coumadin, Eliquis, Xarelto, Lovenox, Pradaxa, Brilinta, or Effient? no Aspirin? no  Patient confirms/reports the following medications:  Current Outpatient Medications  Medication Sig Dispense Refill  . aspirin 81 MG chewable tablet Chew 81 mg by mouth daily.    . cetirizine (ZYRTEC) 10 MG tablet Take 10 mg by mouth daily as needed.     . hydrochlorothiazide (HYDRODIURIL) 25 MG tablet Take 1 tablet (25 mg total) by mouth daily. 90 tablet 4  . simvastatin (ZOCOR) 40 MG tablet Take 1 tablet (40 mg total) by mouth daily. 90 tablet 4   No current facility-administered medications for this visit.     Patient confirms/reports the following allergies:  No Known Allergies  No orders of the defined types were placed in this encounter.   AUTHORIZATION INFORMATION Primary Insurance: 1D#: Group #:  Secondary Insurance: 1D#: Group #:  SCHEDULE INFORMATION: Date: 12/05/17 Time: Location:[facility]

## 2017-12-05 ENCOUNTER — Encounter
Admission: RE | Disposition: A | Discharge status: Home / Self Care | Payer: Self-pay | Source: Ambulatory Visit | Attending: Gastroenterology

## 2017-12-05 ENCOUNTER — Encounter: Payer: Self-pay | Admitting: *Deleted

## 2017-12-05 ENCOUNTER — Ambulatory Visit: Payer: BLUE CROSS/BLUE SHIELD | Admitting: Anesthesiology

## 2017-12-05 ENCOUNTER — Ambulatory Visit
Admission: RE | Admit: 2017-12-05 | Discharge: 2017-12-05 | Disposition: A | Discharge status: Home / Self Care | Payer: BLUE CROSS/BLUE SHIELD | Source: Ambulatory Visit | Attending: Gastroenterology | Admitting: Gastroenterology

## 2017-12-05 DIAGNOSIS — K319 Disease of stomach and duodenum, unspecified: Secondary | ICD-10-CM | POA: Diagnosis not present

## 2017-12-05 DIAGNOSIS — K648 Other hemorrhoids: Secondary | ICD-10-CM

## 2017-12-05 DIAGNOSIS — Z1211 Encounter for screening for malignant neoplasm of colon: Secondary | ICD-10-CM

## 2017-12-05 DIAGNOSIS — K5730 Diverticulosis of large intestine without perforation or abscess without bleeding: Secondary | ICD-10-CM

## 2017-12-05 DIAGNOSIS — Z800 Family history of malignant neoplasm of digestive organs: Secondary | ICD-10-CM

## 2017-12-05 DIAGNOSIS — Z79899 Other long term (current) drug therapy: Secondary | ICD-10-CM | POA: Insufficient documentation

## 2017-12-05 DIAGNOSIS — E785 Hyperlipidemia, unspecified: Secondary | ICD-10-CM | POA: Insufficient documentation

## 2017-12-05 DIAGNOSIS — Z7982 Long term (current) use of aspirin: Secondary | ICD-10-CM | POA: Diagnosis not present

## 2017-12-05 DIAGNOSIS — K644 Residual hemorrhoidal skin tags: Secondary | ICD-10-CM | POA: Diagnosis not present

## 2017-12-05 DIAGNOSIS — I10 Essential (primary) hypertension: Secondary | ICD-10-CM | POA: Diagnosis not present

## 2017-12-05 DIAGNOSIS — D123 Benign neoplasm of transverse colon: Secondary | ICD-10-CM

## 2017-12-05 DIAGNOSIS — Z13810 Encounter for screening for upper gastrointestinal disorder: Secondary | ICD-10-CM | POA: Insufficient documentation

## 2017-12-05 DIAGNOSIS — Z7189 Other specified counseling: Secondary | ICD-10-CM

## 2017-12-05 HISTORY — PX: COLONOSCOPY WITH PROPOFOL: SHX5780

## 2017-12-05 HISTORY — PX: ESOPHAGOGASTRODUODENOSCOPY: SHX5428

## 2017-12-05 SURGERY — COLONOSCOPY WITH PROPOFOL
Anesthesia: General

## 2017-12-05 MED ORDER — PROPOFOL 500 MG/50ML IV EMUL
INTRAVENOUS | Status: AC
  Filled 2017-12-05: qty 50

## 2017-12-05 MED ORDER — GLYCOPYRROLATE 0.2 MG/ML IJ SOLN
INTRAMUSCULAR | Status: DC | PRN
  Administered 2017-12-05: 0.2 mg via INTRAVENOUS

## 2017-12-05 MED ORDER — LIDOCAINE HCL (PF) 2 % IJ SOLN
INTRAMUSCULAR | Status: AC
  Filled 2017-12-05: qty 10

## 2017-12-05 MED ORDER — SODIUM CHLORIDE 0.9 % IV SOLN
INTRAVENOUS | Status: DC
  Administered 2017-12-05: 13:00:00 via INTRAVENOUS

## 2017-12-05 MED ORDER — PROPOFOL 500 MG/50ML IV EMUL
INTRAVENOUS | Status: DC | PRN
  Administered 2017-12-05: 140 ug/kg/min via INTRAVENOUS

## 2017-12-05 MED ORDER — PHENYLEPHRINE HCL 10 MG/ML IJ SOLN
INTRAMUSCULAR | Status: DC | PRN
  Administered 2017-12-05: 100 ug via INTRAVENOUS

## 2017-12-05 MED ORDER — PROPOFOL 10 MG/ML IV BOLUS
INTRAVENOUS | Status: DC | PRN
  Administered 2017-12-05: 80 mg via INTRAVENOUS
  Administered 2017-12-05: 20 mg via INTRAVENOUS
  Administered 2017-12-05: 30 mg via INTRAVENOUS

## 2017-12-05 MED ORDER — LIDOCAINE HCL (CARDIAC) 20 MG/ML IV SOLN
INTRAVENOUS | Status: DC | PRN
  Administered 2017-12-05: 80 mg via INTRAVENOUS

## 2017-12-05 MED ORDER — GLYCOPYRROLATE 0.2 MG/ML IJ SOLN
INTRAMUSCULAR | Status: AC
  Filled 2017-12-05: qty 1

## 2017-12-05 NOTE — Anesthesia Preprocedure Evaluation (Signed)
Anesthesia Evaluation  Patient identified by MRN, date of birth, ID band Patient awake    Reviewed: Allergy & Precautions, H&P , NPO status , Patient's Chart, lab work & pertinent test results, reviewed documented beta blocker date and time   History of Anesthesia Complications Negative for: history of anesthetic complications  Airway Mallampati: I  TM Distance: >3 FB Neck ROM: full    Dental  (+) Dental Advidsory Given   Pulmonary neg pulmonary ROS,           Cardiovascular Exercise Tolerance: Good hypertension, (-) angina(-) CAD, (-) Past MI, (-) Cardiac Stents and (-) CABG (-) dysrhythmias (-) Valvular Problems/Murmurs     Neuro/Psych negative neurological ROS  negative psych ROS   GI/Hepatic negative GI ROS, Neg liver ROS,   Endo/Other  negative endocrine ROS  Renal/GU negative Renal ROS  negative genitourinary   Musculoskeletal   Abdominal   Peds  Hematology negative hematology ROS (+)   Anesthesia Other Findings Past Medical History: No date: Elevated alkaline phosphatase level No date: 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 No date: Hyperlipidemia No date: Hypertension   Reproductive/Obstetrics negative OB ROS                             Anesthesia Physical Anesthesia Plan  ASA: II  Anesthesia Plan: General   Post-op Pain Management:    Induction: Intravenous  PONV Risk Score and Plan: 2 and Propofol infusion  Airway Management Planned: Natural Airway and Nasal Cannula  Additional Equipment:   Intra-op Plan:   Post-operative Plan:   Informed Consent: I have reviewed the patients History and Physical, chart, labs and discussed the procedure including the risks, benefits and alternatives for the proposed anesthesia with the patient or authorized representative who has indicated his/her understanding and acceptance.   Dental Advisory Given  Plan Discussed  with: Anesthesiologist, CRNA and Surgeon  Anesthesia Plan Comments:         Anesthesia Quick Evaluation

## 2017-12-05 NOTE — Anesthesia Post-op Follow-up Note (Signed)
Anesthesia QCDR form completed.        

## 2017-12-05 NOTE — Op Note (Signed)
[facility] Gastroenterology Patient Name: Ryan Day Procedure Date: 12/05/2017 1:32 PM MRN: [id number] Account #: [id number] Date of Birth: 1951/08/26 Admit Type: Outpatient Age: 67 Room: [facility] ENDO ROOM 3 Gender: Male Note Status: Finalized Procedure:            Upper GI endoscopy Indications:          Family history of gastric cancer Providers:            Lin Landsman MD, MD Referring MD:         Guadalupe Maple, MD (Referring MD) Complications:        No immediate complications. Procedure:            Pre-Anesthesia Assessment:                       - Prior to the procedure, a History and Physical was                        performed, and patient medications and allergies were                        reviewed. The patient is competent. The risks and                        benefits of the procedure and the sedation options and                        risks were discussed with the patient. All questions                        were answered and informed consent was obtained.                        Patient identification and proposed procedure were                        verified by the physician. Mental Status Examination:                        alert and oriented. Airway Examination: normal                        oropharyngeal airway and neck mobility. Respiratory                        Examination: clear to auscultation. CV Examination:                        normal. Prophylactic Antibiotics: The patient does not                        require prophylactic antibiotics. Prior Anticoagulants:                        The patient has taken aspirin, last dose was 1 day                        prior to procedure. ASA Grade Assessment: II - A  patient with mild systemic disease. After reviewing the                        risks and benefits, the patient was deemed in                        satisfactory condition to undergo the procedure. The                      anesthesia plan was to use moderate sedation /                        analgesia (conscious sedation). Immediately prior to                        administration of medications, the patient was                        re-assessed for adequacy to receive sedatives. The                        heart rate, respiratory rate, oxygen saturations, blood                        pressure, adequacy of pulmonary ventilation, and                        response to care were monitored throughout the                        procedure. The physical status of the patient was                        re-assessed after the procedure.                       After obtaining informed consent, the endoscope was                        passed under direct vision. Throughout the procedure,                        the patient's blood pressure, pulse, and oxygen                        saturations were monitored continuously. The Endoscope                        was introduced through the mouth, and advanced to the                        second part of duodenum. The upper GI endoscopy was                        accomplished without difficulty. The patient tolerated                        the procedure well. Findings:      The esophagus was normal.      The stomach was normal. Biopsies were taken with a cold forceps for  Helicobacter pylori testing.      The examined duodenum was normal. Impression:           - Normal esophagus.                       - Normal stomach. Biopsied.                       - Normal examined duodenum. Recommendation:       - Resume previous diet today.                       - Continue present medications.                       - Await pathology results.                       - Proceed with colonoscopy as scheduled                       See colonoscopy report Procedure Code(s):    --- Professional ---                       [id number], Esophagogastroduodenoscopy, flexible,  transoral;                        with biopsy, single or multiple Diagnosis Code(s):    --- Professional ---                       Z80.0, Family history of malignant neoplasm of                        digestive organs CPT copyright 2016 American Medical Association. All rights reserved. The codes documented in this report are preliminary and upon coder review may  be revised to meet current compliance requirements. Dr. Ulyess Mort Lin Landsman MD, MD 12/05/2017 1:51:19 PM This report has been signed electronically. Number of Addenda: 0 Note Initiated On: 12/05/2017 1:32 PM Estimated Blood Loss: Estimated blood loss: none.      [facility]

## 2017-12-05 NOTE — H&P (Signed)
  Ryan Darby, MD [address]  [place]  [place], [place] [address]  Main: [phone]  Fax: [phone] Pager: [phone]  Primary Care Physician:  Guadalupe Maple, MD Primary Gastroenterologist:  Dr. Cephas Day  Pre-Procedure History & Physical: HPI:  Ryan Day is a 67 y.o. male is here for an colonoscopy.   Past Medical History:  Diagnosis Date  . Elevated alkaline phosphatase level   . 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 Hyperlipidemia   . Hypertension     History reviewed. No pertinent surgical history.  Prior to Admission medications   Medication Sig Start Date End Date Taking? Authorizing Provider  aspirin 81 MG chewable tablet Chew 81 mg by mouth daily.    [provider]  cetirizine (ZYRTEC) 10 MG tablet Take 10 mg by mouth daily as needed.     [provider]  hydrochlorothiazide (HYDRODIURIL) 25 MG tablet Take 1 tablet (25 mg total) by mouth daily. 05/13/17   Guadalupe Maple, MD  simvastatin (ZOCOR) 40 MG tablet Take 1 tablet (40 mg total) by mouth daily. 05/13/17   Guadalupe Maple, MD    Allergies as of 11/14/2017  . (No Known Allergies)    Family History  Problem Relation Age of Onset  . Cancer Father        lung  . Cancer Brother        throat  . Heart disease Brother 9    Social History   Socioeconomic History  . Marital status: Married    Spouse name: Not on file  . Number of children: Not on file  . Years of education: Not on file  . Highest education level: Not on file  Social Needs  . Financial resource strain: Not on file  . Food insecurity - worry: Not on file  . Food insecurity - inability: Not on file  . Transportation needs - medical: Not on file  . Transportation needs - non-medical: Not on file  Occupational History  . Not on file  Tobacco Use  . Smoking status: Never Smoker  . Smokeless tobacco: Never Used  Substance and Sexual Activity  . Alcohol use: Yes    Comment: 1 or  less  . Drug use: No  . Sexual activity: Not on file  Other Topics Concern  . Not on file  Social History Narrative  . Not on file    Review of Systems: See HPI, otherwise negative ROS  Physical Exam: There were no vitals taken for this visit. General:   Alert,  pleasant and cooperative in NAD Head:  Normocephalic and atraumatic. Neck:  Supple; no masses or thyromegaly. Lungs:  Clear throughout to auscultation.    Heart:  Regular rate and rhythm. Abdomen:  Soft, nontender and nondistended. Normal bowel sounds, without guarding, and without rebound.   Neurologic:  Alert and  oriented x4;  grossly normal neurologically.  Impression/Plan: Ryan Day is here for an colonoscopy to be performed for colon cancer screening  Risks, benefits, limitations, and alternatives regarding  colonoscopy have been reviewed with the patient.  Questions have been answered.  All parties agreeable.   Sherri Sear, MD  12/05/2017, 12:23 PM

## 2017-12-05 NOTE — Transfer of Care (Signed)
Immediate Anesthesia Transfer of Care Note  Patient: Ryan Day  Procedure(s) Performed: COLONOSCOPY WITH PROPOFOL (N/A ) ESOPHAGOGASTRODUODENOSCOPY (EGD)  Patient Location: PACU  Anesthesia Type:General  Level of Consciousness: sedated  Airway & Oxygen Therapy: Patient Spontanous Breathing and Patient connected to nasal cannula oxygen  Post-op Assessment: Report given to RN and Post -op Vital signs reviewed and stable  Post vital signs: Reviewed and stable  Last Vitals:  Vitals:   12/05/17 1414 12/05/17 1416  BP: (!) 102/49 (!) 102/49  Pulse: 77 80  Resp: 17 16  Temp: (!) 36.2 C (!) 36.2 C  SpO2: 97% 97%    Last Pain:  Vitals:   12/05/17 1414  TempSrc: Tympanic         Complications: No apparent anesthesia complications

## 2017-12-05 NOTE — Op Note (Signed)
[facility] Gastroenterology Patient Name: Ryan Day Procedure Date: 12/05/2017 1:31 PM MRN: [id number] Account #: [id number] Date of Birth: 02/18/1951 Admit Type: Outpatient Age: 67 Room: [facility] ENDO ROOM 3 Gender: Male Note Status: Finalized Procedure:            Colonoscopy Indications:          Surveillance: Personal history of colonic polyps                        (unknown histology) on last colonoscopy more than 5                        years ago, Last colonoscopy: September 2007 Providers:            Lin Landsman MD, MD Referring MD:         Guadalupe Maple, MD (Referring MD) Medicines:            Monitored Anesthesia Care Complications:        No immediate complications. Estimated blood loss: None. Procedure:            Pre-Anesthesia Assessment:                       - Prior to the procedure, a History and Physical was                        performed, and patient medications and allergies were                        reviewed. The patient is competent. The risks and                        benefits of the procedure and the sedation options and                        risks were discussed with the patient. All questions                        were answered and informed consent was obtained.                        Patient identification and proposed procedure were                        verified by the physician, the nurse, the                        anesthesiologist, the anesthetist and the technician in                        the pre-procedure area in the procedure room in the                        endoscopy suite. Mental Status Examination: alert and                        oriented. Airway Examination: normal oropharyngeal                        airway and neck mobility. Respiratory Examination:  clear to auscultation. CV Examination: normal.                        Prophylactic Antibiotics: The patient does not require                 prophylactic antibiotics. Prior Anticoagulants: The                        patient has taken aspirin, last dose was 1 day prior to                        procedure. ASA Grade Assessment: II - A patient with                        mild systemic disease. After reviewing the risks and                        benefits, the patient was deemed in satisfactory                        condition to undergo the procedure. The anesthesia plan                        was to use monitored anesthesia care (MAC). Immediately                        prior to administration of medications, the patient was                        re-assessed for adequacy to receive sedatives. The                        heart rate, respiratory rate, oxygen saturations, blood                        pressure, adequacy of pulmonary ventilation, and                        response to care were monitored throughout the                        procedure. The physical status of the patient was                        re-assessed after the procedure.                       After obtaining informed consent, the colonoscope was                        passed under direct vision. Throughout the procedure,                        the patient's blood pressure, pulse, and oxygen                        saturations were monitored continuously. The                        Colonoscope was introduced through the anus  and                        advanced to the the cecum, identified by appendiceal                        orifice and ileocecal valve. The colonoscopy was                        performed without difficulty. The patient tolerated the                        procedure well. The quality of the bowel preparation                        was evaluated using the BBPS [facility] Bowel Preparation                        Scale) with scores of: Right Colon = 3, Transverse                        Colon = 3 and Left Colon = 3 (entire mucosa seen well                         with no residual staining, small fragments of stool or                        opaque liquid). The total BBPS score equals 9. Findings:      The perianal and digital rectal examinations were normal. Pertinent       negatives include normal sphincter tone and no palpable rectal lesions.      A 5 mm polyp was found in the transverse colon. The polyp was sessile.       The polyp was removed with a cold snare. Resection and retrieval were       complete.      Multiple diverticula were found in the sigmoid colon and descending       colon. There was no evidence of diverticular bleeding.      Non-bleeding external and internal hemorrhoids were found during       retroflexion. The hemorrhoids were medium-sized.      The exam was otherwise without abnormality. Impression:           - One 5 mm polyp in the transverse colon, removed with                        a cold snare. Resected and retrieved.                       - Severe diverticulosis in the sigmoid colon and in the                        descending colon. There was no evidence of diverticular                        bleeding.                       - Non-bleeding external and internal hemorrhoids.                       -  The examination was otherwise normal. Recommendation:       - Discharge patient to home.                       - Resume previous diet today.                       - Continue present medications.                       - Await pathology results.                       - Repeat colonoscopy in 5 years for surveillance. Procedure Code(s):    --- Professional ---                       [phone], Colonoscopy, flexible; with removal of tumor(s),                        polyp(s), or other lesion(s) by snare technique Diagnosis Code(s):    --- Professional ---                       Z86.010, Personal history of colonic polyps                       D12.3, Benign neoplasm of transverse colon (hepatic                         flexure or splenic flexure)                       K64.8, Other hemorrhoids                       K57.30, Diverticulosis of large intestine without                        perforation or abscess without bleeding CPT copyright 2016 American Medical Association. All rights reserved. The codes documented in this report are preliminary and upon coder review may  be revised to meet current compliance requirements. Dr. Ulyess Mort Lin Landsman MD, MD 12/05/2017 2:13:58 PM This report has been signed electronically. Number of Addenda: 0 Note Initiated On: 12/05/2017 1:31 PM Scope Withdrawal Time: 0 hours 15 minutes 57 seconds  Total Procedure Duration: 0 hours 18 minutes 0 seconds       [facility]

## 2017-12-08 ENCOUNTER — Encounter: Payer: Self-pay | Admitting: Gastroenterology

## 2017-12-08 NOTE — Anesthesia Postprocedure Evaluation (Signed)
Anesthesia Post Note  Patient: Gearold Wainer  Procedure(s) Performed: COLONOSCOPY WITH PROPOFOL (N/A ) ESOPHAGOGASTRODUODENOSCOPY (EGD)  Patient location during evaluation: Endoscopy Anesthesia Type: General Level of consciousness: awake and alert Pain management: pain level controlled Vital Signs Assessment: post-procedure vital signs reviewed and stable Respiratory status: spontaneous breathing, nonlabored ventilation and respiratory function stable Cardiovascular status: blood pressure returned to baseline and stable Postop Assessment: no apparent nausea or vomiting Anesthetic complications: no     Last Vitals:  Vitals:   12/05/17 1434 12/05/17 1450  BP: (!) 130/102   Pulse: 65   Resp: 16   Temp:    SpO2: 99% 100%    Last Pain:  Vitals:   12/06/17 0909  TempSrc:   PainSc: 0-No pain                 Alphonsus Sias

## 2017-12-09 LAB — SURGICAL PATHOLOGY

## 2017-12-10 ENCOUNTER — Encounter: Payer: Self-pay | Admitting: Gastroenterology

## 2018-03-17 ENCOUNTER — Encounter: Payer: Self-pay | Admitting: Family Medicine

## 2018-05-21 ENCOUNTER — Encounter: Payer: BLUE CROSS/BLUE SHIELD | Admitting: Family Medicine

## 2018-08-17 ENCOUNTER — Encounter: Payer: BLUE CROSS/BLUE SHIELD | Admitting: Family Medicine

## 2018-11-02 ENCOUNTER — Ambulatory Visit (INDEPENDENT_AMBULATORY_CARE_PROVIDER_SITE_OTHER): Payer: BLUE CROSS/BLUE SHIELD | Admitting: Family Medicine

## 2018-11-02 ENCOUNTER — Encounter: Payer: Self-pay | Admitting: Family Medicine

## 2018-11-02 VITALS — BP 136/84 | HR 68 | Temp 97.8°F | Ht 75.98 in | Wt 250.0 lb

## 2018-11-02 DIAGNOSIS — R748 Abnormal levels of other serum enzymes: Secondary | ICD-10-CM

## 2018-11-02 DIAGNOSIS — Z1329 Encounter for screening for other suspected endocrine disorder: Secondary | ICD-10-CM

## 2018-11-02 DIAGNOSIS — Z125 Encounter for screening for malignant neoplasm of prostate: Secondary | ICD-10-CM

## 2018-11-02 DIAGNOSIS — I10 Essential (primary) hypertension: Secondary | ICD-10-CM

## 2018-11-02 DIAGNOSIS — E7800 Pure hypercholesterolemia, unspecified: Secondary | ICD-10-CM

## 2018-11-02 DIAGNOSIS — R252 Cramp and spasm: Secondary | ICD-10-CM

## 2018-11-02 DIAGNOSIS — E785 Hyperlipidemia, unspecified: Secondary | ICD-10-CM | POA: Diagnosis not present

## 2018-11-02 LAB — URINALYSIS, ROUTINE W REFLEX MICROSCOPIC
Bilirubin, UA: NEGATIVE
Glucose, UA: NEGATIVE
Ketones, UA: NEGATIVE
LEUKOCYTES UA: NEGATIVE
NITRITE UA: NEGATIVE
PH UA: 6.5 (ref 5.0–7.5)
Protein, UA: NEGATIVE
RBC UA: NEGATIVE
Specific Gravity, UA: 1.02 (ref 1.005–1.030)
Urobilinogen, Ur: 1 mg/dL (ref 0.2–1.0)

## 2018-11-02 MED ORDER — HYDROCHLOROTHIAZIDE 25 MG PO TABS: 25.0000 mg | ORAL_TABLET | Freq: Every day | ORAL | 4 refills | Status: DC

## 2018-11-02 MED ORDER — SIMVASTATIN 40 MG PO TABS: 40.0000 mg | ORAL_TABLET | Freq: Every day | ORAL | 4 refills | Status: DC

## 2018-11-02 NOTE — Assessment & Plan Note (Signed)
The current medical regimen is effective;  continue present plan and medications.  

## 2018-11-02 NOTE — Assessment & Plan Note (Signed)
Leg cramps also checked at vascular and reported as normal with no PAD symptoms and leg cramps have about abated.

## 2018-11-02 NOTE — Progress Notes (Signed)
BP 136/84 (BP Location: Left Arm)   Pulse 68   Temp 97.8 F (36.6 C) (Oral)   Ht 6' 3.98" (1.93 m)   Wt 250 lb (113.4 kg)   SpO2 98%   BMI 30.44 kg/m    Subjective:    Patient ID: Ryan Day, male    DOB: April 13, 1951, 68 y.o.   MRN: [id number]  HPI: [name] is a 68 y.o. male  Chief Complaint  Patient presents with  . Annual Exam  Patient all in all doing well no complaints takes hydrochlorothiazide for blood pressure with good control checks from time to time and has good readings. Takes simvastatin for cholesterol without problems and good control. Takes aspirin also without problems. Has some varicose veins though not requiring any specific treatment doing well otherwise.  Relevant past medical, surgical, family and social history reviewed and updated as indicated. Interim medical history since our last visit reviewed. Allergies and medications reviewed and updated.  Review of Systems  Constitutional: Negative.   HENT: Negative.   Eyes: Negative.   Respiratory: Negative.   Cardiovascular: Negative.   Gastrointestinal: Negative.   Endocrine: Negative.   Genitourinary: Negative.   Musculoskeletal: Negative.   Skin: Negative.   Allergic/Immunologic: Negative.   Neurological: Negative.   Hematological: Negative.   Psychiatric/Behavioral: Negative.     Per HPI unless specifically indicated above     Objective:    BP 136/84 (BP Location: Left Arm)   Pulse 68   Temp 97.8 F (36.6 C) (Oral)   Ht 6' 3.98" (1.93 m)   Wt 250 lb (113.4 kg)   SpO2 98%   BMI 30.44 kg/m   Wt Readings from Last 3 Encounters:  11/02/18 250 lb (113.4 kg)  12/05/17 240 lb (108.9 kg)  11/13/17 224 lb (101.6 kg)    Physical Exam Constitutional:      Appearance: He is well-developed.  HENT:     Head: Normocephalic and atraumatic.     Right Ear: External ear normal.     Left Ear: External ear normal.  Eyes:     Conjunctiva/sclera: Conjunctivae normal.     Pupils: Pupils are  equal, round, and reactive to light.  Neck:     Musculoskeletal: Normal range of motion and neck supple.  Cardiovascular:     Rate and Rhythm: Normal rate and regular rhythm.     Heart sounds: Normal heart sounds.  Pulmonary:     Effort: Pulmonary effort is normal.     Breath sounds: Normal breath sounds.  Abdominal:     General: Bowel sounds are normal.     Palpations: Abdomen is soft. There is no hepatomegaly or splenomegaly.  Genitourinary:    Penis: Normal.      Rectum: Normal.     Comments: BPH changes Musculoskeletal: Normal range of motion.  Skin:    Findings: No erythema or rash.  Neurological:     Mental Status: He is alert and oriented to person, place, and time.     Deep Tendon Reflexes: Reflexes are normal and symmetric.  Psychiatric:        Behavior: Behavior normal.        Thought Content: Thought content normal.        Judgment: Judgment normal.     Results for orders placed or performed during the hospital encounter of 12/05/17  Surgical pathology  Result Value Ref Range   SURGICAL PATHOLOGY      Surgical Pathology CASE: [id number] PATIENT: [facility] Surgical Pathology  Report     SPECIMEN SUBMITTED: A. Stomach, R/O H pylori; cbx B. Colon polyp, transverse; cold snare  CLINICAL HISTORY: None provided  PRE-OPERATIVE DIAGNOSIS: Screening colonoscopy  POST-OPERATIVE DIAGNOSIS: Diverticulosis, internal hemorrhoids     DIAGNOSIS: A. STOMACH; COLD BIOPSY: - ANTRAL AND OXYNTIC MUCOSA WITH MILD REACTIVE GASTROPATHY. - NEGATIVE FOR ACTIVE INFLAMMATION, INTESTINAL METAPLASIA, DYSPLASIA, AND MALIGNANCY. - NEGATIVE FOR H. PYLORI IN HEMATOXYLIN AND EOSIN SECTIONS.  B. COLON POLYP, TRANSVERSE; COLD SNARE: - TUBULAR ADENOMA. - NEGATIVE FOR HIGH-GRADE DYSPLASIA AND MALIGNANCY.   GROSS DESCRIPTION: A. Labeled: C BX gastric to rule out H. pylori Tissue fragment(s): 3 Size: 0.3-0.4 cm Description: in formalin, tan fragments  Entirely  submitted in 1 cassette(s).   B. Labeled: cold snare transverse colon polyp Tissue fragment(s): 1 Size: 0.3 cm De scription: in formalin, tan fragment  Entirely submitted in 1 cassette(s).  Final Diagnosis performed by Bryan Lemma, MD.  Electronically signed 12/09/2017 12:33:29PM    The electronic signature indicates that the named Attending Pathologist has evaluated the specimen  Technical component performed at [facility], [address] [place], [place] [address] Lab: [phone] Dir: Rush Farmer, MD, MMM  Professional component performed at [facility], [facility], [place], [place], [place] [address] Lab: [phone] Dir: Dellia Nims. Reuel Derby, MD        Assessment & Plan:   Problem List Items Addressed This Visit      Cardiovascular and Mediastinum   Hypertension - Primary    The current medical regimen is effective;  continue present plan and medications.         Other   Hyperlipidemia    The current medical regimen is effective;  continue present plan and medications.       Elevated alkaline phosphatase level   Exercise-induced leg cramps    Leg cramps also checked at vascular and reported as normal with no PAD symptoms and leg cramps have about abated.       Other Visit Diagnoses    Thyroid disorder screen       Prostate cancer screening           Follow up plan: Return in about 6 months (around 05/03/2019) for BMP,  Lipids, ALT, AST.

## 2018-11-03 ENCOUNTER — Telehealth: Payer: Self-pay | Admitting: Family Medicine

## 2018-11-03 DIAGNOSIS — E876 Hypokalemia: Secondary | ICD-10-CM

## 2018-11-03 LAB — LIPID PANEL
CHOLESTEROL TOTAL: 193 mg/dL (ref 100–199)
Chol/HDL Ratio: 3.2 ratio (ref 0.0–5.0)
HDL: 60 mg/dL (ref >39)
LDL Calculated: 113 mg/dL — ABNORMAL HIGH (ref 0–99)
Triglycerides: 100 mg/dL (ref 0–149)
VLDL Cholesterol Cal: 20 mg/dL (ref 5–40)

## 2018-11-03 LAB — COMPREHENSIVE METABOLIC PANEL
ALBUMIN: 4.4 g/dL (ref 3.8–4.8)
ALK PHOS: 131 IU/L — AB (ref 39–117)
ALT: 15 IU/L (ref 0–44)
AST: 21 IU/L (ref 0–40)
Albumin/Globulin Ratio: 1.4 (ref 1.2–2.2)
BILIRUBIN TOTAL: 1.4 mg/dL — AB (ref 0.0–1.2)
BUN / CREAT RATIO: 7 — AB (ref 10–24)
BUN: 8 mg/dL (ref 8–27)
CHLORIDE: 94 mmol/L — AB (ref 96–106)
CO2: 28 mmol/L (ref 20–29)
Calcium: 9.6 mg/dL (ref 8.6–10.2)
Creatinine, Ser: 1.08 mg/dL (ref 0.76–1.27)
GFR calc non Af Amer: 71 mL/min/{1.73_m2} (ref >59)
GFR, EST AFRICAN AMERICAN: 82 mL/min/{1.73_m2} (ref >59)
GLOBULIN, TOTAL: 3.2 g/dL (ref 1.5–4.5)
Glucose: 96 mg/dL (ref 65–99)
Potassium: 3 mmol/L — ABNORMAL LOW (ref 3.5–5.2)
SODIUM: 140 mmol/L (ref 134–144)
Total Protein: 7.6 g/dL (ref 6.0–8.5)

## 2018-11-03 LAB — CBC WITH DIFFERENTIAL/PLATELET
BASOS: 0 %
Basophils Absolute: 0 10*3/uL (ref 0.0–0.2)
EOS (ABSOLUTE): 0.1 10*3/uL (ref 0.0–0.4)
EOS: 2 %
HEMATOCRIT: 42.6 % (ref 37.5–51.0)
HEMOGLOBIN: 14.7 g/dL (ref 13.0–17.7)
Immature Grans (Abs): 0 10*3/uL (ref 0.0–0.1)
Immature Granulocytes: 0 %
Lymphocytes Absolute: 1.6 10*3/uL (ref 0.7–3.1)
Lymphs: 29 %
MCH: 31.5 pg (ref 26.6–33.0)
MCHC: 34.5 g/dL (ref 31.5–35.7)
MCV: 91 fL (ref 79–97)
MONOCYTES: 12 %
Monocytes Absolute: 0.7 10*3/uL (ref 0.1–0.9)
NEUTROS ABS: 3.2 10*3/uL (ref 1.4–7.0)
Neutrophils: 57 %
Platelets: 270 10*3/uL (ref 150–450)
RBC: 4.66 x10E6/uL (ref 4.14–5.80)
RDW: 12.7 % (ref 11.6–15.4)
WBC: 5.6 10*3/uL (ref 3.4–10.8)

## 2018-11-03 LAB — PSA: PROSTATE SPECIFIC AG, SERUM: 0.6 ng/mL (ref 0.0–4.0)

## 2018-11-03 LAB — TSH: TSH: 0.648 u[IU]/mL (ref 0.450–4.500)

## 2018-11-03 NOTE — Telephone Encounter (Signed)
Phone call Discussed with patient low potassium taking hydrochlorothiazide has not been an issue before patient will do better with potassium rich foods and recheck BMP 1 month.

## 2018-11-03 NOTE — Telephone Encounter (Signed)
-----   Message from Georgina Peer, [place] sent at 11/03/2018 11:41 AM EST ----- Phone call.

## 2019-04-28 ENCOUNTER — Telehealth: Payer: Self-pay | Admitting: Family Medicine

## 2019-04-28 NOTE — Telephone Encounter (Signed)
Called pt to reschedule, no answer, left vm

## 2019-05-03 ENCOUNTER — Ambulatory Visit: Payer: BLUE CROSS/BLUE SHIELD | Admitting: Family Medicine

## 2019-05-13 ENCOUNTER — Other Ambulatory Visit: Payer: Self-pay

## 2019-05-13 ENCOUNTER — Encounter: Payer: Self-pay | Admitting: Family Medicine

## 2019-05-13 ENCOUNTER — Ambulatory Visit (INDEPENDENT_AMBULATORY_CARE_PROVIDER_SITE_OTHER): Payer: BC Managed Care – PPO | Admitting: Family Medicine

## 2019-05-13 DIAGNOSIS — R748 Abnormal levels of other serum enzymes: Secondary | ICD-10-CM

## 2019-05-13 DIAGNOSIS — I10 Essential (primary) hypertension: Secondary | ICD-10-CM | POA: Diagnosis not present

## 2019-05-13 DIAGNOSIS — E785 Hyperlipidemia, unspecified: Secondary | ICD-10-CM

## 2019-05-13 NOTE — Assessment & Plan Note (Signed)
The current medical regimen is effective;  continue present plan and medications.  

## 2019-05-13 NOTE — Progress Notes (Signed)
BP 127/78   Wt 250 lb (113.4 kg)   BMI 30.44 kg/m    Subjective:    Patient ID: Ryan Day, male    DOB: 1951-05-11, 68 y.o.   MRN: [id number]  HPI: Ryan Day is a 68 y.o. male  Med check Discussed with patient all in all doing well no complaints blood pressure doing well checked by nurse at work with good readings.  Weight is quit gaining weight but still is too heavy states he is eating a lot of fried chicken. Cholesterol no issues taking medicines without problems. Leg cramps have resolved. Reviewed elevated alkaline [place] and no change from previous readings.  Relevant past medical, surgical, family and social history reviewed and updated as indicated. Interim medical history since our last visit reviewed. Allergies and medications reviewed and updated.  Review of Systems  Constitutional: Negative.   Respiratory: Negative.   Cardiovascular: Negative.     Per HPI unless specifically indicated above     Objective:    BP 127/78   Wt 250 lb (113.4 kg)   BMI 30.44 kg/m   Wt Readings from Last 3 Encounters:  05/13/19 250 lb (113.4 kg)  11/02/18 250 lb (113.4 kg)  12/05/17 240 lb (108.9 kg)    Physical Exam  Results for orders placed or performed in visit on 11/02/18  CBC with Differential/Platelet  Result Value Ref Range   WBC 5.6 3.4 - 10.8 x10E3/uL   RBC 4.66 4.14 - 5.80 x10E6/uL   Hemoglobin 14.7 13.0 - 17.7 g/dL   Hematocrit 42.6 37.5 - 51.0 %   MCV 91 79 - 97 fL   MCH 31.5 26.6 - 33.0 pg   MCHC 34.5 31.5 - 35.7 g/dL   RDW 12.7 11.6 - 15.4 %   Platelets 270 150 - 450 x10E3/uL   Neutrophils 57 Not Estab. %   Lymphs 29 Not Estab. %   Monocytes 12 Not Estab. %   Eos 2 Not Estab. %   Basos 0 Not Estab. %   Neutrophils Absolute 3.2 1.4 - 7.0 x10E3/uL   Lymphocytes Absolute 1.6 0.7 - 3.1 x10E3/uL   Monocytes Absolute 0.7 0.1 - 0.9 x10E3/uL   EOS (ABSOLUTE) 0.1 0.0 - 0.4 x10E3/uL   Basophils Absolute 0.0 0.0 - 0.2 x10E3/uL   Immature Granulocytes 0 Not  Estab. %   Immature Grans (Abs) 0.0 0.0 - 0.1 x10E3/uL  Comprehensive metabolic panel  Result Value Ref Range   Glucose 96 65 - 99 mg/dL   BUN 8 8 - 27 mg/dL   Creatinine, Ser 1.08 0.76 - 1.27 mg/dL   GFR calc non Af Amer 71 >59 mL/min/1.73   GFR calc Af Amer 82 >59 mL/min/1.73   BUN/Creatinine Ratio 7 (L) 10 - 24   Sodium 140 134 - 144 mmol/L   Potassium 3.0 (L) 3.5 - 5.2 mmol/L   Chloride 94 (L) 96 - 106 mmol/L   CO2 28 20 - 29 mmol/L   Calcium 9.6 8.6 - 10.2 mg/dL   Total Protein 7.6 6.0 - 8.5 g/dL   Albumin 4.4 3.8 - 4.8 g/dL   Globulin, Total 3.2 1.5 - 4.5 g/dL   Albumin/Globulin Ratio 1.4 1.2 - 2.2   Bilirubin Total 1.4 (H) 0.0 - 1.2 mg/dL   Alkaline Phosphatase 131 (H) 39 - 117 IU/L   AST 21 0 - 40 IU/L   ALT 15 0 - 44 IU/L  Lipid panel  Result Value Ref Range   Cholesterol, Total 193 100 - 199  mg/dL   Triglycerides 100 0 - 149 mg/dL   HDL 60 >39 mg/dL   VLDL Cholesterol Cal 20 5 - 40 mg/dL   LDL Calculated 113 (H) 0 - 99 mg/dL   Chol/HDL Ratio 3.2 0.0 - 5.0 ratio  PSA  Result Value Ref Range   Prostate Specific Ag, Serum 0.6 0.0 - 4.0 ng/mL  TSH  Result Value Ref Range   TSH 0.648 0.450 - 4.500 uIU/mL  Urinalysis, Routine w reflex microscopic  Result Value Ref Range   Specific Gravity, UA 1.020 1.005 - 1.030   pH, UA 6.5 5.0 - 7.5   Color, UA Yellow Yellow   Appearance Ur Clear Clear   Leukocytes, UA Negative Negative   Protein, UA Negative Negative/Trace   Glucose, UA Negative Negative   Ketones, UA Negative Negative   RBC, UA Negative Negative   Bilirubin, UA Negative Negative   Urobilinogen, Ur 1.0 0.2 - 1.0 mg/dL   Nitrite, UA Negative Negative      Assessment & Plan:   Problem List Items Addressed This Visit      Cardiovascular and Mediastinum   Hypertension    The current medical regimen is effective;  continue present plan and medications.       Relevant Orders   Basic metabolic panel     Other   Hyperlipidemia    The current medical  regimen is effective;  continue present plan and medications.       Relevant Orders   LP+ALT+AST [place], Waived   Elevated alkaline phosphatase level    No change         Telemedicine using audio/video telecommunications for a synchronous communication visit. Today's visit due to COVID-19 isolation precautions I connected with and verified that I am speaking with the correct person using two identifiers.   I discussed the limitations, risks, security and privacy concerns of performing an evaluation and management service by telecommunication and the availability of in person appointments. I also discussed with the patient that there may be a patient responsible charge related to this service. The patient expressed understanding and agreed to proceed. The patient's location is work. I am at home.   I discussed the assessment and treatment plan with the patient. The patient was provided an opportunity to ask questions and all were answered. The patient agreed with the plan and demonstrated an understanding of the instructions.   The patient was advised to call back or seek an in-person evaluation if the symptoms worsen or if the condition fails to improve as anticipated.   I provided 21+ minutes of time during this encounter.  Follow up plan: Return in about 6 months (around 11/13/2019) for Physical Exam.

## 2019-05-13 NOTE — Assessment & Plan Note (Signed)
No change 

## 2019-07-31 ENCOUNTER — Other Ambulatory Visit: Payer: Self-pay | Admitting: *Deleted

## 2019-07-31 DIAGNOSIS — Z20822 Contact with and (suspected) exposure to covid-19: Secondary | ICD-10-CM

## 2019-08-02 LAB — NOVEL CORONAVIRUS, NAA: SARS-CoV-2, NAA: NOT DETECTED

## 2019-10-08 ENCOUNTER — Telehealth: Payer: Self-pay | Admitting: Family Medicine

## 2019-10-08 ENCOUNTER — Encounter: Payer: Self-pay | Admitting: Family Medicine

## 2019-10-08 NOTE — Telephone Encounter (Signed)
Called pt to r/s 11/15/19 appt, no answer, left vm, sending letter.

## 2019-11-15 ENCOUNTER — Encounter: Payer: Self-pay | Admitting: Family Medicine

## 2019-11-22 ENCOUNTER — Encounter: Payer: Self-pay | Admitting: Family Medicine

## 2020-02-01 ENCOUNTER — Telehealth: Payer: Self-pay | Admitting: Family Medicine

## 2020-02-01 ENCOUNTER — Telehealth: Payer: Self-pay

## 2020-02-01 ENCOUNTER — Encounter: Payer: Self-pay | Admitting: Family Medicine

## 2020-02-01 ENCOUNTER — Other Ambulatory Visit: Payer: Self-pay

## 2020-02-01 ENCOUNTER — Ambulatory Visit (INDEPENDENT_AMBULATORY_CARE_PROVIDER_SITE_OTHER): Payer: BC Managed Care – PPO | Admitting: Family Medicine

## 2020-02-01 VITALS — BP 123/80 | HR 64 | Temp 98.0°F | Ht 75.79 in | Wt 238.4 lb

## 2020-02-01 DIAGNOSIS — Z23 Encounter for immunization: Secondary | ICD-10-CM

## 2020-02-01 DIAGNOSIS — E785 Hyperlipidemia, unspecified: Secondary | ICD-10-CM

## 2020-02-01 DIAGNOSIS — I10 Essential (primary) hypertension: Secondary | ICD-10-CM | POA: Diagnosis not present

## 2020-02-01 DIAGNOSIS — Z0000 Encounter for general adult medical examination without abnormal findings: Secondary | ICD-10-CM

## 2020-02-01 DIAGNOSIS — E7800 Pure hypercholesterolemia, unspecified: Secondary | ICD-10-CM | POA: Diagnosis not present

## 2020-02-01 LAB — URINALYSIS, ROUTINE W REFLEX MICROSCOPIC
Bilirubin, UA: NEGATIVE
Glucose, UA: NEGATIVE
Ketones, UA: NEGATIVE
Leukocytes,UA: NEGATIVE
Nitrite, UA: NEGATIVE
Protein,UA: NEGATIVE
RBC, UA: NEGATIVE
Specific Gravity, UA: 1.015 (ref 1.005–1.030)
Urobilinogen, Ur: 1 mg/dL (ref 0.2–1.0)
pH, UA: 7 (ref 5.0–7.5)

## 2020-02-01 LAB — MICROALBUMIN, URINE WAIVED
Creatinine, Urine Waived: 100 mg/dL (ref 10–300)
Microalb, Ur Waived: 30 mg/L — ABNORMAL HIGH (ref 0–19)

## 2020-02-01 MED ORDER — SIMVASTATIN 40 MG PO TABS: 40.0000 mg | ORAL_TABLET | Freq: Every day | ORAL | 1 refills | Status: DC

## 2020-02-01 MED ORDER — HYDROCHLOROTHIAZIDE 25 MG PO TABS: 25.0000 mg | ORAL_TABLET | Freq: Every day | ORAL | 1 refills | Status: DC

## 2020-02-01 NOTE — Progress Notes (Signed)
BP 123/80 (BP Location: Left Arm, Patient Position: Sitting, Cuff Size: Normal)   Pulse 64   Temp 98 F (36.7 C) (Oral)   Ht 6' 3.79" (1.925 m)   Wt 238 lb 6.4 oz (108.1 kg)   SpO2 97%   BMI 29.18 kg/m    Subjective:    Patient ID: Ryan Day, male    DOB: 04/24/1951, 69 y.o.   MRN: [id number]  HPI: Ryan Day is a 69 y.o. male presenting on 02/01/2020 for comprehensive medical examination. Current medical complaints include:  HYPERTENSION / HYPERLIPIDEMIA Satisfied with current treatment? yes Duration of hypertension: chronic BP monitoring frequency: not checking BP medication side effects: no Past BP meds: HCTZ Duration of hyperlipidemia: chronic Cholesterol medication side effects: no Cholesterol supplements: none Past cholesterol medications: simvastatin Medication compliance: excellent compliance Aspirin: yes Recent stressors: no Recurrent headaches: no Visual changes: no Palpitations: no Dyspnea: no Chest pain: no Lower extremity edema: no Dizzy/lightheaded: no  He currently lives with: wife Interim Problems from his last visit: no  Depression Screen done today and results listed below:  Depression screen [facility] 2/9 02/01/2020 11/02/2018 05/13/2017 11/29/2015  Decreased Interest 1 0 1 1  Down, Depressed, Hopeless 1 0 0 2  PHQ - 2 Score 2 0 1 3  Altered sleeping 2 1 - 1  Tired, decreased energy 3 1 - 2  Change in appetite 1 0 - 2  Feeling bad or failure about yourself  0 0 - 0  Trouble concentrating 0 0 - 0  Moving slowly or fidgety/restless 0 0 - 0  Suicidal thoughts 0 0 - 0  PHQ-9 Score 8 2 - 8  Difficult doing work/chores Not difficult at all - - -    Past Medical History:  Past Medical History:  Diagnosis Date  . Elevated alkaline phosphatase level   . 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 Hyperlipidemia   . Hypertension     Surgical History:  Past Surgical History:  Procedure Laterality Date  . COLONOSCOPY WITH PROPOFOL N/A  12/05/2017   Procedure: COLONOSCOPY WITH PROPOFOL;  Surgeon: Lin Landsman, MD;  Location: [facility] ENDOSCOPY;  Service: Gastroenterology;  Laterality: N/A;  . ESOPHAGOGASTRODUODENOSCOPY  12/05/2017   Procedure: ESOPHAGOGASTRODUODENOSCOPY (EGD);  Surgeon: Lin Landsman, MD;  Location: [facility] ENDOSCOPY;  Service: Gastroenterology;;    Medications:  Current Outpatient Medications on File Prior to Visit  Medication Sig  . aspirin 81 MG chewable tablet Chew 81 mg by mouth daily.   No current facility-administered medications on file prior to visit.    Allergies:  No Known Allergies  Social History:  Social History   Socioeconomic History  . Marital status: Married    Spouse name: Not on file  . Number of children: Not on file  . Years of education: Not on file  . Highest education level: Not on file  Occupational History  . Not on file  Tobacco Use  . Smoking status: Never Smoker  . Smokeless tobacco: Never Used  Substance and Sexual Activity  . Alcohol use: Yes    Comment: 1 or less  . Drug use: No  . Sexual activity: Not on file  Other Topics Concern  . Not on file  Social History Narrative  . Not on file   Social Determinants of Health   Financial Resource Strain:   . Difficulty of Paying Living Expenses:   Food Insecurity:   . Worried About Charity fundraiser in the Last Year:   .  Ran Out of Food in the Last Year:   Transportation Needs:   . Film/video editor (Medical):   Marland Kitchen Lack of Transportation (Non-Medical):   Physical Activity:   . Days of Exercise per Week:   . Minutes of Exercise per Session:   Stress:   . Feeling of Stress :   Social Connections:   . Frequency of Communication with Friends and Family:   . Frequency of Social Gatherings with Friends and Family:   . Attends Religious Services:   . Active Member of Clubs or Organizations:   . Attends Archivist Meetings:   Marland Kitchen Marital Status:   Intimate Partner Violence:   . Fear  of Current or Ex-Partner:   . Emotionally Abused:   Marland Kitchen Physically Abused:   . Sexually Abused:    Social History   Tobacco Use  Smoking Status Never Smoker  Smokeless Tobacco Never Used   Social History   Substance and Sexual Activity  Alcohol Use Yes   Comment: 1 or less    Family History:  Family History  Problem Relation Age of Onset  . Cancer Father        lung  . Cancer Brother        throat  . Heart disease Brother 50    Past medical history, surgical history, medications, allergies, family history and social history reviewed with patient today and changes made to appropriate areas of the chart.   Review of Systems  Constitutional: Negative.   HENT: Negative.   Eyes: Negative.   Respiratory: Negative.   Cardiovascular: Negative.   Gastrointestinal: Negative.   Genitourinary: Negative.   Musculoskeletal: Negative.   Skin: Negative.   Neurological: Negative.   Endo/Heme/Allergies: Negative.   Psychiatric/Behavioral: Negative.     All other ROS negative except what is listed above and in the HPI.      Objective:    BP 123/80 (BP Location: Left Arm, Patient Position: Sitting, Cuff Size: Normal)   Pulse 64   Temp 98 F (36.7 C) (Oral)   Ht 6' 3.79" (1.925 m)   Wt 238 lb 6.4 oz (108.1 kg)   SpO2 97%   BMI 29.18 kg/m   Wt Readings from Last 3 Encounters:  02/01/20 238 lb 6.4 oz (108.1 kg)  05/13/19 250 lb (113.4 kg)  11/02/18 250 lb (113.4 kg)    Physical Exam Vitals and nursing note reviewed.  Constitutional:      General: He is not in acute distress.    Appearance: Normal appearance. He is not ill-appearing, toxic-appearing or diaphoretic.  HENT:     Head: Normocephalic and atraumatic.     Right Ear: Tympanic membrane, ear canal and external ear normal. There is no impacted cerumen.     Left Ear: Tympanic membrane, ear canal and external ear normal. There is no impacted cerumen.     Nose: Nose normal. No congestion or rhinorrhea.      Mouth/Throat:     Mouth: Mucous membranes are moist.     Pharynx: Oropharynx is clear. No oropharyngeal exudate or posterior oropharyngeal erythema.  Eyes:     General: No scleral icterus.       Right eye: No discharge.        Left eye: No discharge.     Extraocular Movements: Extraocular movements intact.     Conjunctiva/sclera: Conjunctivae normal.     Pupils: Pupils are equal, round, and reactive to light.  Neck:     Vascular: No carotid bruit.  Cardiovascular:     Rate and Rhythm: Normal rate and regular rhythm.     Pulses: Normal pulses.     Heart sounds: No murmur. No friction rub. No gallop.   Pulmonary:     Effort: Pulmonary effort is normal. No respiratory distress.     Breath sounds: Normal breath sounds. No stridor. No wheezing, rhonchi or rales.  Chest:     Chest wall: No tenderness.  Abdominal:     General: Abdomen is flat. Bowel sounds are normal. There is no distension.     Palpations: Abdomen is soft. There is no mass.     Tenderness: There is no abdominal tenderness. There is no right CVA tenderness, left CVA tenderness, guarding or rebound.     Hernia: No hernia is present.  Genitourinary:    Comments: Genital exam deferred with shared decision making Musculoskeletal:        General: No swelling, tenderness, deformity or signs of injury.     Cervical back: Normal range of motion and neck supple. No rigidity. No muscular tenderness.     Right lower leg: No edema.     Left lower leg: No edema.  Lymphadenopathy:     Cervical: No cervical adenopathy.  Skin:    General: Skin is warm and dry.     Capillary Refill: Capillary refill takes less than 2 seconds.     Coloration: Skin is not jaundiced or pale.     Findings: No bruising, erythema, lesion or rash.  Neurological:     General: No focal deficit present.     Mental Status: He is alert and oriented to person, place, and time.     Cranial Nerves: No cranial nerve deficit.     Sensory: No sensory deficit.      Motor: No weakness.     Coordination: Coordination normal.     Gait: Gait normal.     Deep Tendon Reflexes: Reflexes normal.  Psychiatric:        Mood and Affect: Mood normal.        Behavior: Behavior normal.        Thought Content: Thought content normal.        Judgment: Judgment normal.     Results for orders placed or performed in visit on 07/31/19  Novel Coronavirus, NAA ([facility])   Specimen: Nasopharyngeal(NP) swabs in vial transport medium   NASOPHARYNGE  SCREENIN  Result Value Ref Range   SARS-CoV-2, NAA Not Detected Not Detected      Assessment & Plan:   Problem List Items Addressed This Visit      Cardiovascular and Mediastinum   Hypertension    Under good control on current regimen. Continue current regimen. Continue to monitor. Call with any concerns. Refills given. Labs drawn today.       Relevant Medications   simvastatin (ZOCOR) 40 MG tablet   hydrochlorothiazide (HYDRODIURIL) 25 MG tablet   Other Relevant Orders   Comprehensive metabolic panel   Microalbumin, Urine Waived     Other   Hyperlipidemia    Under good control on current regimen. Continue current regimen. Continue to monitor. Call with any concerns. Refills given. Labs drawn today.       Relevant Medications   simvastatin (ZOCOR) 40 MG tablet   hydrochlorothiazide (HYDRODIURIL) 25 MG tablet   Other Relevant Orders   Comprehensive metabolic panel   Lipid Panel w/o Chol/HDL Ratio    Other Visit Diagnoses    Routine general medical examination at a health  care facility    -  Primary   Vaccines updated. Screening labs checked today. Colonoscopy up to date. Continue diet and exercise. Continue to monitor. Call with any concerns.    Relevant Orders   CBC with Differential/Platelet   Comprehensive metabolic panel   Lipid Panel w/o Chol/HDL Ratio   PSA   TSH   Urinalysis, Routine w reflex microscopic       Discussed aspirin prophylaxis for myocardial infarction prevention and decision  was made to continue ASA  LABORATORY TESTING:  Health maintenance labs ordered today as discussed above.   The natural history of prostate cancer and ongoing controversy regarding screening and potential treatment outcomes of prostate cancer has been discussed with the patient. The meaning of a false positive PSA and a false negative PSA has been discussed. He indicates understanding of the limitations of this screening test and wishes to proceed with screening PSA testing.   IMMUNIZATIONS:   - Tdap: Tetanus vaccination status reviewed: last tetanus booster within 10 years. - Influenza: Up to date - Pneumovax: Not applicable  - Prevnar: Up to date   SCREENING: - Colonoscopy: Up to date  Discussed with patient purpose of the colonoscopy is to detect colon cancer at curable precancerous or early stages   PATIENT COUNSELING:    Sexuality: Discussed sexually transmitted diseases, partner selection, use of condoms, avoidance of unintended pregnancy  and contraceptive alternatives.   Advised to avoid cigarette smoking.  I discussed with the patient that most people either abstain from alcohol or drink within safe limits (<=14/week and <=4 drinks/occasion for males, <=7/weeks and <= 3 drinks/occasion for females) and that the risk for alcohol disorders and other health effects rises proportionally with the number of drinks per week and how often a drinker exceeds daily limits.  Discussed cessation/primary prevention of drug use and availability of treatment for abuse.   Diet: Encouraged to adjust caloric intake to maintain  or achieve ideal body weight, to reduce intake of dietary saturated fat and total fat, to limit sodium intake by avoiding high sodium foods and not adding table salt, and to maintain adequate dietary potassium and calcium preferably from fresh fruits, vegetables, and low-fat dairy products.    stressed the importance of regular exercise  Injury prevention: Discussed  safety belts, safety helmets, smoke detector, smoking near bedding or upholstery.   Dental health: Discussed importance of regular tooth brushing, flossing, and dental visits.   Follow up plan: NEXT PREVENTATIVE PHYSICAL DUE IN 1 YEAR. Return in about 6 months (around 08/03/2020).

## 2020-02-01 NOTE — Telephone Encounter (Signed)
Copied from [place] [id number]. Topic: General - Other >> Feb 01, 2020  2:04 PM Rainey Pines A wrote: Patient called to inform that he received First covid shot 11-08-19 and second covid shot 3-15-21of Pfizer shot.

## 2020-02-01 NOTE — Assessment & Plan Note (Signed)
Under good control on current regimen. Continue current regimen. Continue to monitor. Call with any concerns. Refills given. Labs drawn today.   

## 2020-02-01 NOTE — Telephone Encounter (Signed)
error 

## 2020-02-01 NOTE — Telephone Encounter (Signed)
Vaccines updated in patient's chart.

## 2020-02-01 NOTE — Patient Instructions (Signed)
Pneumococcal Conjugate Vaccine suspension for injection What is this medicine? PNEUMOCOCCAL VACCINE (NEU mo KOK al vak SEEN) is a vaccine used to prevent pneumococcus bacterial infections. These bacteria can cause serious infections like pneumonia, meningitis, and blood infections. This vaccine will lower your chance of getting pneumonia. If you do get pneumonia, it can make your symptoms milder and your illness shorter. This vaccine will not treat an infection and will not cause infection. This vaccine is recommended for infants and young children, adults with certain medical conditions, and adults 65 years or older. This medicine may be used for other purposes; ask your health care provider or pharmacist if you have questions. COMMON BRAND NAME(S): Prevnar, Prevnar 13 What should I tell my health care provider before I take this medicine? They need to know if you have any of these conditions:  bleeding problems  fever  immune system problems  an unusual or allergic reaction to pneumococcal vaccine, diphtheria toxoid, other vaccines, latex, other medicines, foods, dyes, or preservatives  pregnant or trying to get pregnant  breast-feeding How should I use this medicine? This vaccine is for injection into a muscle. It is given by a health care professional. A copy of Vaccine Information Statements will be given before each vaccination. Read this sheet carefully each time. The sheet may change frequently. Talk to your pediatrician regarding the use of this medicine in children. While this drug may be prescribed for children as young as 6 weeks old for selected conditions, precautions do apply. Overdosage: If you think you have taken too much of this medicine contact a poison control center or emergency room at once. NOTE: This medicine is only for you. Do not share this medicine with others. What if I miss a dose? It is important not to miss your dose. Call your doctor or health care  professional if you are unable to keep an appointment. What may interact with this medicine?  medicines for cancer chemotherapy  medicines that suppress your immune function  steroid medicines like prednisone or cortisone This list may not describe all possible interactions. Give your health care provider a list of all the medicines, herbs, non-prescription drugs, or dietary supplements you use. Also tell them if you smoke, drink alcohol, or use illegal drugs. Some items may interact with your medicine. What should I watch for while using this medicine? Mild fever and pain should go away in 3 days or less. Report any unusual symptoms to your doctor or health care professional. What side effects may I notice from receiving this medicine? Side effects that you should report to your doctor or health care professional as soon as possible:  allergic reactions like skin rash, itching or hives, swelling of the face, lips, or tongue  breathing problems  confused  fast or irregular heartbeat  fever over 102 degrees F  seizures  unusual bleeding or bruising  unusual muscle weakness Side effects that usually do not require medical attention (report to your doctor or health care professional if they continue or are bothersome):  aches and pains  diarrhea  fever of 102 degrees F or less  headache  irritable  loss of appetite  pain, tender at site where injected  trouble sleeping This list may not describe all possible side effects. Call your doctor for medical advice about side effects. You may report side effects to FDA at 1-800-FDA-1088. Where should I keep my medicine? This does not apply. This vaccine is given in a clinic, pharmacy, doctor's office,   or other health care setting and will not be stored at home. NOTE: This sheet is a summary. It may not cover all possible information. If you have questions about this medicine, talk to your doctor, pharmacist, or health care  provider.  2020 Elsevier/Gold Standard (2014-06-16 10:27:27)  

## 2020-02-02 LAB — COMPREHENSIVE METABOLIC PANEL
ALT: 12 IU/L (ref 0–44)
AST: 18 IU/L (ref 0–40)
Albumin/Globulin Ratio: 1.3 (ref 1.2–2.2)
Albumin: 4.4 g/dL (ref 3.8–4.8)
Alkaline Phosphatase: 123 IU/L — ABNORMAL HIGH (ref 39–117)
BUN/Creatinine Ratio: 7 — ABNORMAL LOW (ref 10–24)
BUN: 8 mg/dL (ref 8–27)
Bilirubin Total: 1.1 mg/dL (ref 0.0–1.2)
CO2: 29 mmol/L (ref 20–29)
Calcium: 10.5 mg/dL — ABNORMAL HIGH (ref 8.6–10.2)
Chloride: 100 mmol/L (ref 96–106)
Creatinine, Ser: 1.08 mg/dL (ref 0.76–1.27)
GFR calc Af Amer: 82 mL/min/{1.73_m2} (ref >59)
GFR calc non Af Amer: 71 mL/min/{1.73_m2} (ref >59)
Globulin, Total: 3.3 g/dL (ref 1.5–4.5)
Glucose: 100 mg/dL — ABNORMAL HIGH (ref 65–99)
Potassium: 3.8 mmol/L (ref 3.5–5.2)
Sodium: 143 mmol/L (ref 134–144)
Total Protein: 7.7 g/dL (ref 6.0–8.5)

## 2020-02-02 LAB — CBC WITH DIFFERENTIAL/PLATELET
Basophils Absolute: 0 10*3/uL (ref 0.0–0.2)
Basos: 0 %
EOS (ABSOLUTE): 0.1 10*3/uL (ref 0.0–0.4)
Eos: 1 %
Hematocrit: 44.1 % (ref 37.5–51.0)
Hemoglobin: 14.7 g/dL (ref 13.0–17.7)
Immature Grans (Abs): 0 10*3/uL (ref 0.0–0.1)
Immature Granulocytes: 0 %
Lymphocytes Absolute: 1.5 10*3/uL (ref 0.7–3.1)
Lymphs: 27 %
MCH: 31.8 pg (ref 26.6–33.0)
MCHC: 33.3 g/dL (ref 31.5–35.7)
MCV: 96 fL (ref 79–97)
Monocytes Absolute: 0.7 10*3/uL (ref 0.1–0.9)
Monocytes: 12 %
Neutrophils Absolute: 3.3 10*3/uL (ref 1.4–7.0)
Neutrophils: 60 %
Platelets: 267 10*3/uL (ref 150–450)
RBC: 4.62 x10E6/uL (ref 4.14–5.80)
RDW: 12.3 % (ref 11.6–15.4)
WBC: 5.5 10*3/uL (ref 3.4–10.8)

## 2020-02-02 LAB — LIPID PANEL W/O CHOL/HDL RATIO
Cholesterol, Total: 174 mg/dL (ref 100–199)
HDL: 57 mg/dL (ref >39)
LDL Chol Calc (NIH): 103 mg/dL — ABNORMAL HIGH (ref 0–99)
Triglycerides: 76 mg/dL (ref 0–149)
VLDL Cholesterol Cal: 14 mg/dL (ref 5–40)

## 2020-02-02 LAB — TSH: TSH: 0.5 u[IU]/mL (ref 0.450–4.500)

## 2020-02-02 LAB — PSA: Prostate Specific Ag, Serum: 0.4 ng/mL (ref 0.0–4.0)

## 2020-02-04 ENCOUNTER — Encounter: Payer: Self-pay | Admitting: Family Medicine

## 2020-03-01 ENCOUNTER — Encounter: Payer: Self-pay | Admitting: Nurse Practitioner

## 2020-03-01 ENCOUNTER — Other Ambulatory Visit: Payer: Self-pay

## 2020-03-01 ENCOUNTER — Non-Acute Institutional Stay: Payer: BC Managed Care – PPO | Admitting: Nurse Practitioner

## 2020-03-01 VITALS — BP 126/72 | HR 75 | Temp 97.0°F | Resp 18 | Wt 292.0 lb

## 2020-03-01 DIAGNOSIS — I509 Heart failure, unspecified: Secondary | ICD-10-CM

## 2020-03-01 DIAGNOSIS — Z515 Encounter for palliative care: Secondary | ICD-10-CM

## 2020-03-01 NOTE — Progress Notes (Addendum)
Error, wrong chart; wrong patient, not a palliative care patient and no visit made

## 2020-03-20 ENCOUNTER — Telehealth: Payer: Self-pay | Admitting: Nurse Practitioner

## 2020-03-20 NOTE — Telephone Encounter (Signed)
I called Mr. Seely's home and cell to schedule f/u PC visit at home, no answer, message left with contact information

## 2020-06-07 ENCOUNTER — Inpatient Hospital Stay
Admission: RE | Admit: 2020-06-07 | Discharge: 2020-06-23 | Disposition: A | Discharge status: Rehabilitation Facility | Payer: BC Managed Care – PPO | Source: Other Acute Inpatient Hospital | Attending: Internal Medicine | Admitting: Internal Medicine

## 2020-06-07 ENCOUNTER — Other Ambulatory Visit (HOSPITAL_COMMUNITY): Payer: BC Managed Care – PPO

## 2020-06-07 DIAGNOSIS — I469 Cardiac arrest, cause unspecified: Secondary | ICD-10-CM | POA: Diagnosis present

## 2020-06-07 DIAGNOSIS — G909 Disorder of the autonomic nervous system, unspecified: Secondary | ICD-10-CM | POA: Diagnosis present

## 2020-06-07 DIAGNOSIS — S14109S Unspecified injury at unspecified level of cervical spinal cord, sequela: Secondary | ICD-10-CM

## 2020-06-07 DIAGNOSIS — J9601 Acute respiratory failure with hypoxia: Secondary | ICD-10-CM | POA: Diagnosis present

## 2020-06-07 DIAGNOSIS — G8250 Quadriplegia, unspecified: Secondary | ICD-10-CM

## 2020-06-07 DIAGNOSIS — E870 Hyperosmolality and hypernatremia: Secondary | ICD-10-CM

## 2020-06-07 DIAGNOSIS — G903 Multi-system degeneration of the autonomic nervous system: Secondary | ICD-10-CM

## 2020-06-07 DIAGNOSIS — J189 Pneumonia, unspecified organism: Secondary | ICD-10-CM

## 2020-06-07 DIAGNOSIS — N179 Acute kidney failure, unspecified: Secondary | ICD-10-CM | POA: Diagnosis present

## 2020-06-07 DIAGNOSIS — D649 Anemia, unspecified: Secondary | ICD-10-CM

## 2020-06-07 DIAGNOSIS — K592 Neurogenic bowel, not elsewhere classified: Secondary | ICD-10-CM

## 2020-06-07 DIAGNOSIS — N319 Neuromuscular dysfunction of bladder, unspecified: Secondary | ICD-10-CM

## 2020-06-07 DIAGNOSIS — Z931 Gastrostomy status: Secondary | ICD-10-CM

## 2020-06-07 DIAGNOSIS — Z9289 Personal history of other medical treatment: Secondary | ICD-10-CM

## 2020-06-07 DIAGNOSIS — Z930 Tracheostomy status: Secondary | ICD-10-CM

## 2020-06-07 HISTORY — DX: Acute and chronic respiratory failure with hypoxia: J96.21

## 2020-06-07 HISTORY — DX: Unspecified injury at unspecified level of cervical spinal cord, sequela: S14.109S

## 2020-06-07 HISTORY — DX: Cardiac arrest, cause unspecified: I46.9

## 2020-06-07 HISTORY — DX: Disorder of the autonomic nervous system, unspecified: G90.9

## 2020-06-07 HISTORY — DX: Hypovolemia: E86.1

## 2020-06-07 IMAGING — DX DG ABDOMEN 1V
1 series · 2 of 2 positions shown · non-contrast
Comparison: None.

CLINICAL DATA: Status post endoscopic gastrostomy placement

EXAM:
ABDOMEN - 1 VIEW

[Series 1: abdomen · 0.14mm/px · 2 of 2 slices shown]
[im 1/2]
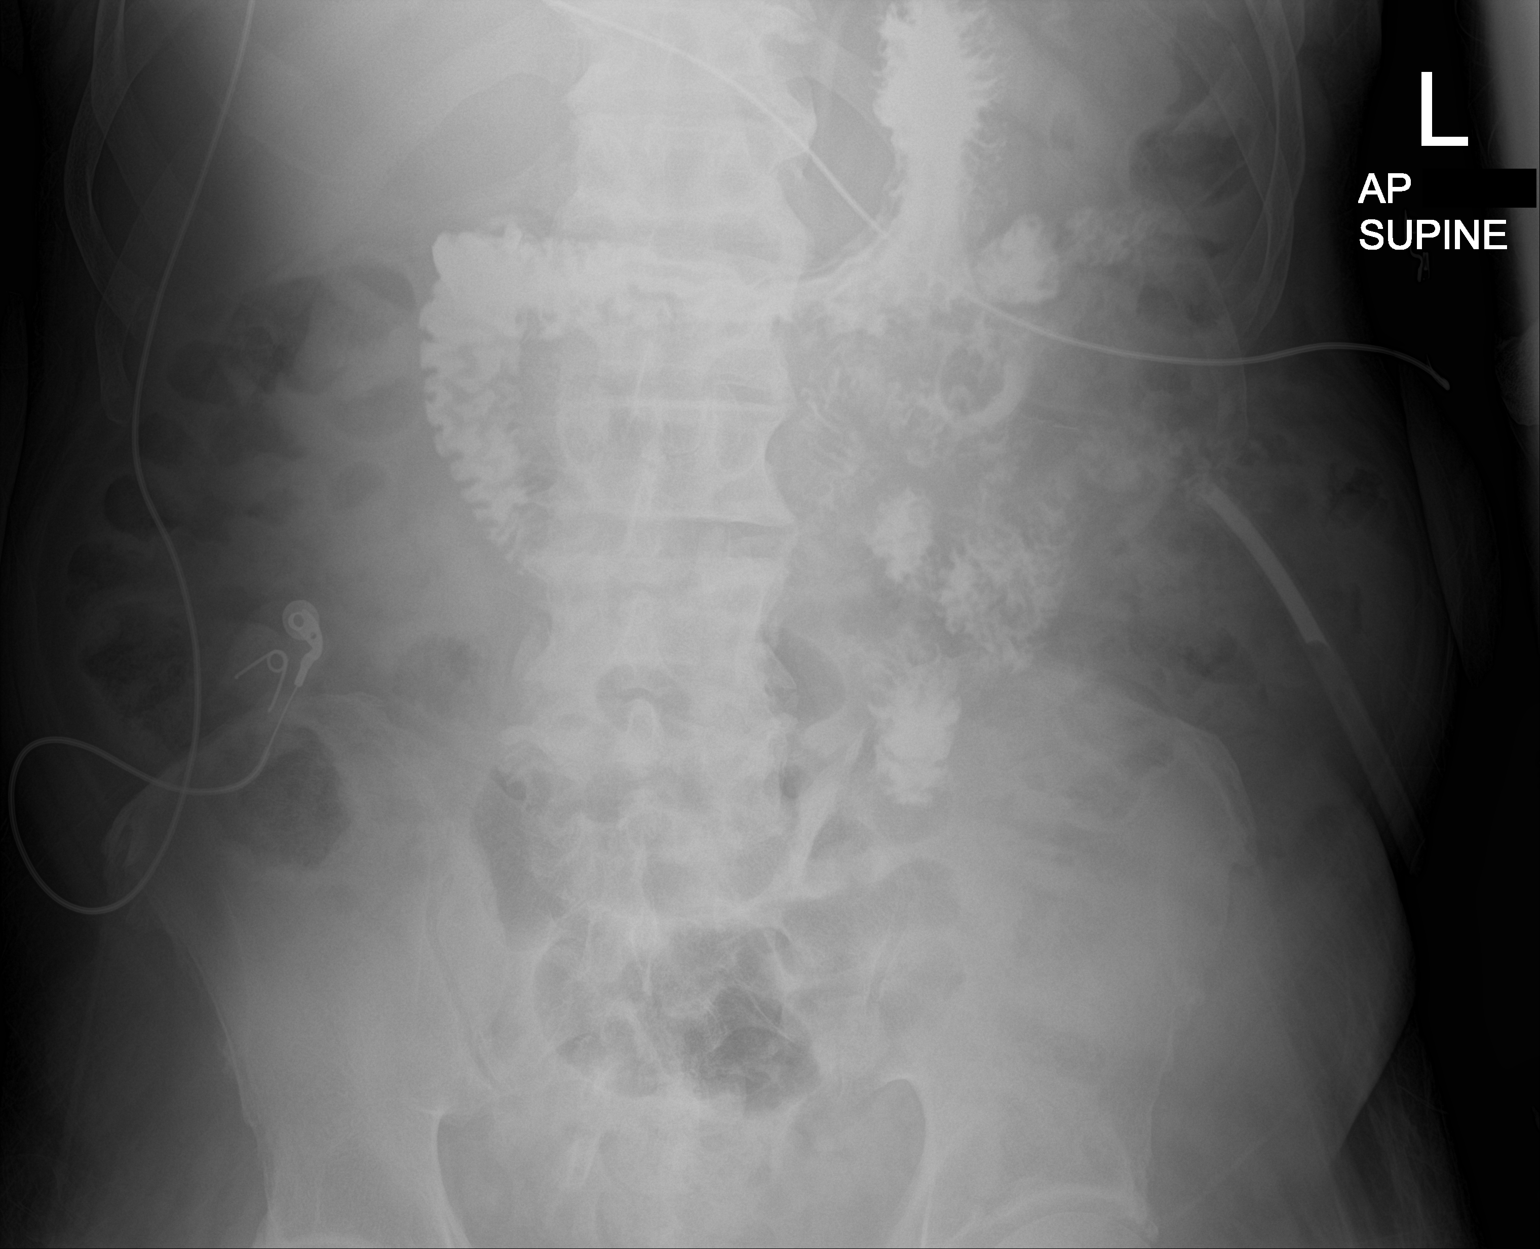
[im 2/2]
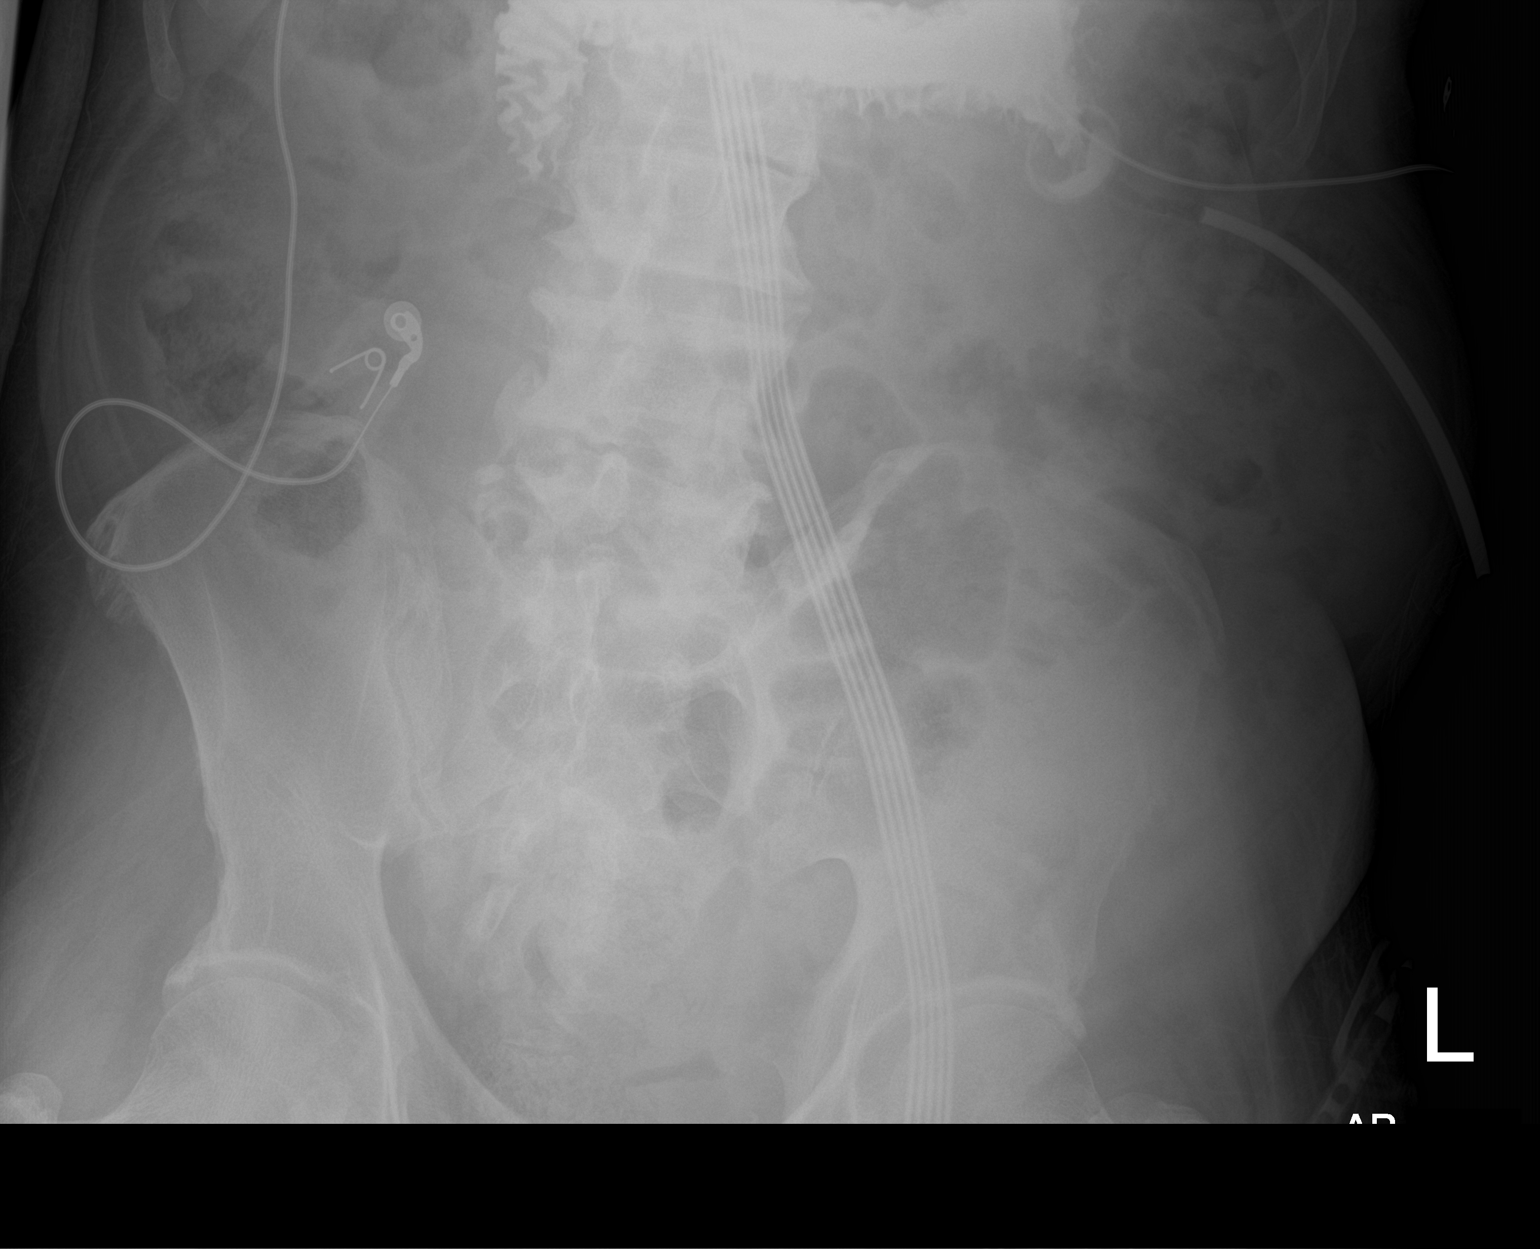

[2 of 2 positions shown; findings below may reference images not displayed]

FINDINGS: Single view radiograph of the abdomen demonstrates installation of
radiopaque contrast in existing gastrostomy catheter within the left
upper quadrant the abdomen which opacifies the gastric lumen and
subsequently demonstrates passage of contrast into multiple
nondilated loops of proximal jejunum. There is no extraluminal
extension of contrast to suggest leak. Normal abdominal gas pattern.
No organomegaly. No gross free intraperitoneal gas.
IMPRESSION: Appropriately positioned gastrostomy catheter within the gastric
lumen. No extraluminal extension of contrast to suggest leak.

## 2020-06-08 LAB — CBC WITH DIFFERENTIAL/PLATELET
Abs Immature Granulocytes: 0.03 10*3/uL (ref 0.00–0.07)
Basophils Absolute: 0 10*3/uL (ref 0.0–0.1)
Basophils Relative: 0 %
Eosinophils Absolute: 0.2 10*3/uL (ref 0.0–0.5)
Eosinophils Relative: 3 %
HCT: 28.3 % — ABNORMAL LOW (ref 39.0–52.0)
Hemoglobin: 9.3 g/dL — ABNORMAL LOW (ref 13.0–17.0)
Immature Granulocytes: 0 %
Lymphocytes Relative: 14 %
Lymphs Abs: 1.1 10*3/uL (ref 0.7–4.0)
MCH: 29.9 pg (ref 26.0–34.0)
MCHC: 32.9 g/dL (ref 30.0–36.0)
MCV: 91 fL (ref 80.0–100.0)
Monocytes Absolute: 0.9 10*3/uL (ref 0.1–1.0)
Monocytes Relative: 11 %
Neutro Abs: 5.6 10*3/uL (ref 1.7–7.7)
Neutrophils Relative %: 72 %
Platelets: 235 10*3/uL (ref 150–400)
RBC: 3.11 MIL/uL — ABNORMAL LOW (ref 4.22–5.81)
RDW: 14.7 % (ref 11.5–15.5)
WBC: 7.8 10*3/uL (ref 4.0–10.5)
nRBC: 0 % (ref 0.0–0.2)

## 2020-06-08 LAB — URINALYSIS, ROUTINE W REFLEX MICROSCOPIC
Bilirubin Urine: NEGATIVE
Glucose, UA: NEGATIVE mg/dL
Hgb urine dipstick: NEGATIVE
Ketones, ur: NEGATIVE mg/dL
Nitrite: NEGATIVE
Protein, ur: NEGATIVE mg/dL
Specific Gravity, Urine: 1.013 (ref 1.005–1.030)
pH: 6 (ref 5.0–8.0)

## 2020-06-08 LAB — BASIC METABOLIC PANEL
Anion gap: 9 (ref 5–15)
BUN: 21 mg/dL (ref 8–23)
CO2: 26 mmol/L (ref 22–32)
Calcium: 9.5 mg/dL (ref 8.9–10.3)
Chloride: 100 mmol/L (ref 98–111)
Creatinine, Ser: 0.74 mg/dL (ref 0.61–1.24)
GFR calc Af Amer: >60 mL/min (ref >60)
GFR calc non Af Amer: >60 mL/min (ref >60)
Glucose, Bld: 119 mg/dL — ABNORMAL HIGH (ref 70–99)
Potassium: 3.9 mmol/L (ref 3.5–5.1)
Sodium: 135 mmol/L (ref 135–145)

## 2020-06-09 ENCOUNTER — Other Ambulatory Visit (HOSPITAL_COMMUNITY): Payer: BC Managed Care – PPO

## 2020-06-09 DIAGNOSIS — N179 Acute kidney failure, unspecified: Secondary | ICD-10-CM

## 2020-06-09 DIAGNOSIS — I469 Cardiac arrest, cause unspecified: Secondary | ICD-10-CM

## 2020-06-09 DIAGNOSIS — G909 Disorder of the autonomic nervous system, unspecified: Secondary | ICD-10-CM

## 2020-06-09 DIAGNOSIS — E861 Hypovolemia: Secondary | ICD-10-CM

## 2020-06-09 DIAGNOSIS — J9621 Acute and chronic respiratory failure with hypoxia: Secondary | ICD-10-CM | POA: Diagnosis not present

## 2020-06-09 DIAGNOSIS — S14109S Unspecified injury at unspecified level of cervical spinal cord, sequela: Secondary | ICD-10-CM

## 2020-06-09 LAB — COMPREHENSIVE METABOLIC PANEL
ALT: 52 U/L — ABNORMAL HIGH (ref 0–44)
AST: 24 U/L (ref 15–41)
Albumin: 2.5 g/dL — ABNORMAL LOW (ref 3.5–5.0)
Alkaline Phosphatase: 131 U/L — ABNORMAL HIGH (ref 38–126)
Anion gap: 8 (ref 5–15)
BUN: 19 mg/dL (ref 8–23)
CO2: 27 mmol/L (ref 22–32)
Calcium: 9.5 mg/dL (ref 8.9–10.3)
Chloride: 101 mmol/L (ref 98–111)
Creatinine, Ser: 0.81 mg/dL (ref 0.61–1.24)
GFR calc Af Amer: >60 mL/min (ref >60)
GFR calc non Af Amer: >60 mL/min (ref >60)
Glucose, Bld: 108 mg/dL — ABNORMAL HIGH (ref 70–99)
Potassium: 3.6 mmol/L (ref 3.5–5.1)
Sodium: 136 mmol/L (ref 135–145)
Total Bilirubin: 0.8 mg/dL (ref 0.3–1.2)
Total Protein: 7.6 g/dL (ref 6.5–8.1)

## 2020-06-09 LAB — CBC
HCT: 30.4 % — ABNORMAL LOW (ref 39.0–52.0)
Hemoglobin: 10.1 g/dL — ABNORMAL LOW (ref 13.0–17.0)
MCH: 30.3 pg (ref 26.0–34.0)
MCHC: 33.2 g/dL (ref 30.0–36.0)
MCV: 91.3 fL (ref 80.0–100.0)
Platelets: 256 10*3/uL (ref 150–400)
RBC: 3.33 MIL/uL — ABNORMAL LOW (ref 4.22–5.81)
RDW: 14.9 % (ref 11.5–15.5)
WBC: 7.2 10*3/uL (ref 4.0–10.5)
nRBC: 0 % (ref 0.0–0.2)

## 2020-06-09 LAB — HEMOGLOBIN A1C
Hgb A1c MFr Bld: 5.8 % — ABNORMAL HIGH (ref 4.8–5.6)
Mean Plasma Glucose: 119.76 mg/dL

## 2020-06-09 LAB — MAGNESIUM: Magnesium: 2.1 mg/dL (ref 1.7–2.4)

## 2020-06-09 LAB — PHOSPHORUS: Phosphorus: 3.8 mg/dL (ref 2.5–4.6)

## 2020-06-09 LAB — TSH: TSH: 1.419 u[IU]/mL (ref 0.350–4.500)

## 2020-06-09 LAB — T4, FREE: Free T4: 0.89 ng/dL (ref 0.61–1.12)

## 2020-06-09 IMAGING — DX DG CHEST 1V PORT
1 series · 1 of 1 positions shown · non-contrast
Comparison: None.

CLINICAL DATA: Pneumonia

EXAM:
PORTABLE CHEST 1 VIEW

[chest ap]
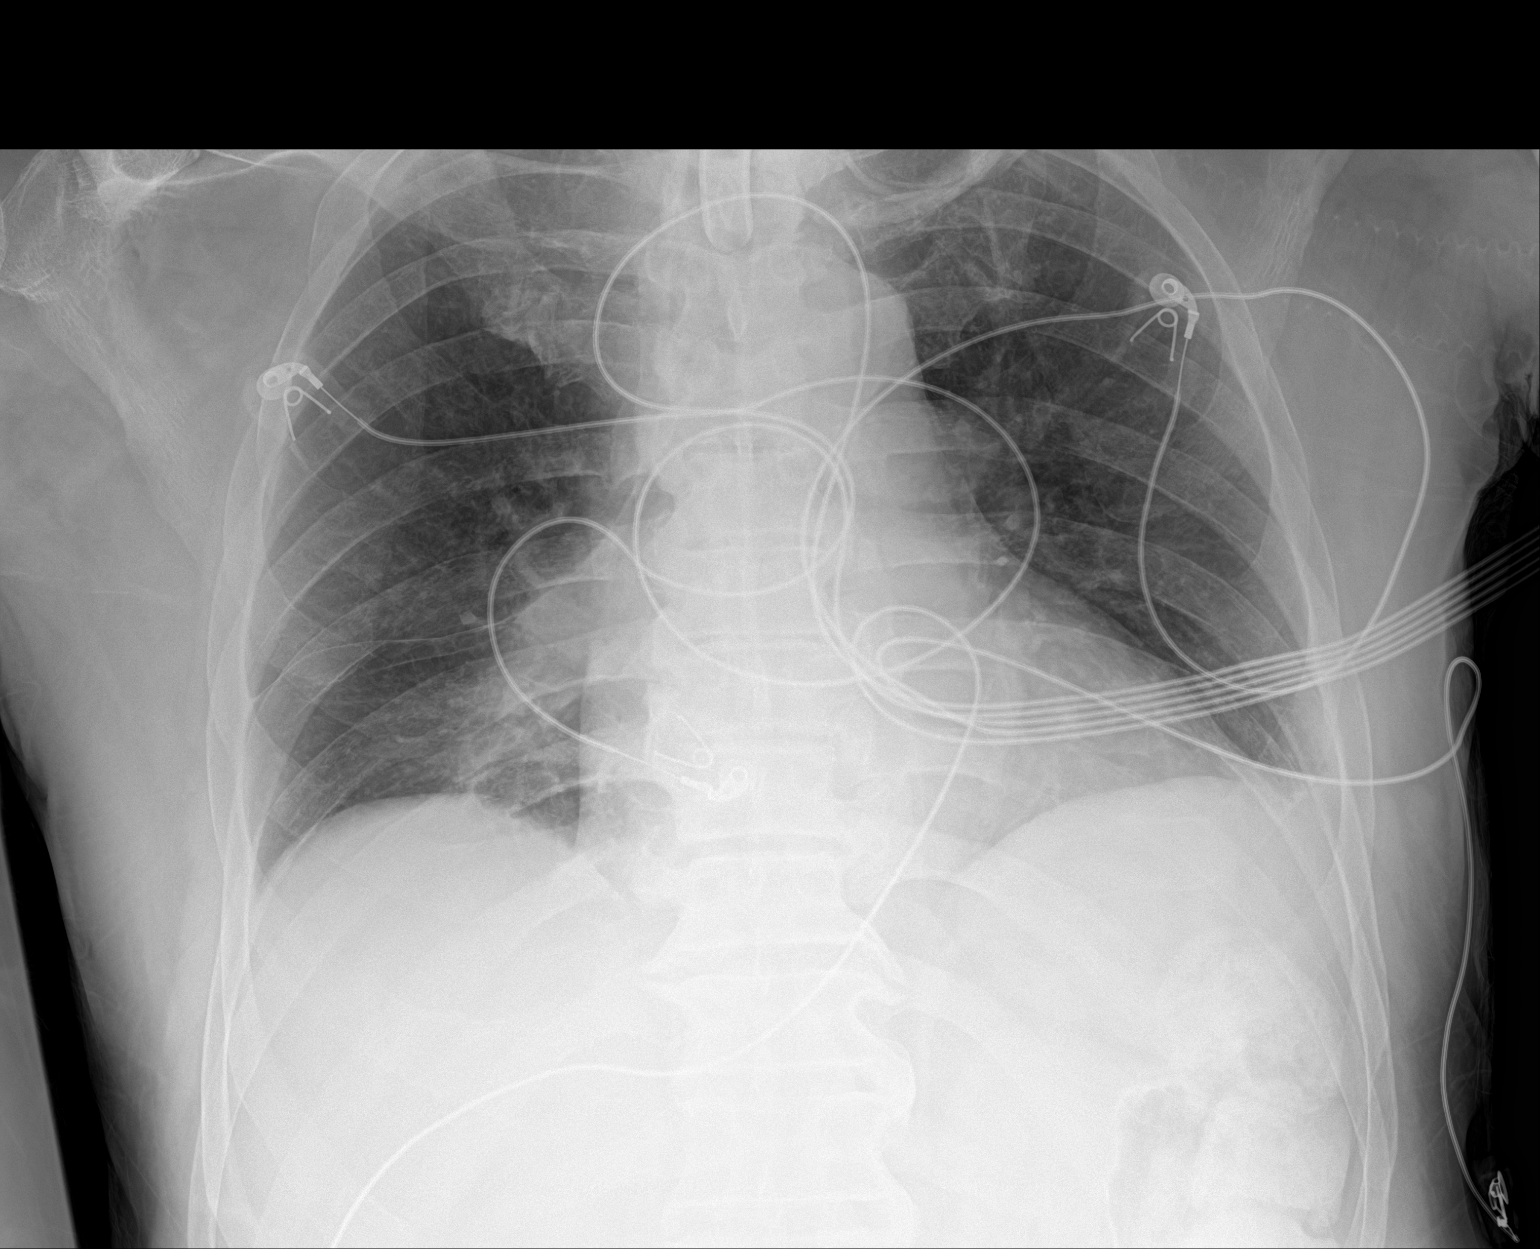

[1 of 1 positions shown; findings below may reference images not displayed]

FINDINGS: Tracheostomy in place with the tip projecting over the mid trachea.
Heart is borderline in size. Patchy right infrahilar basilar
airspace opacity. Left lung clear. No effusions or pneumothorax.
IMPRESSION: Right infrahilar airspace opacity concerning for pneumonia.

## 2020-06-10 ENCOUNTER — Encounter: Payer: Self-pay | Admitting: Internal Medicine

## 2020-06-10 DIAGNOSIS — J9602 Acute respiratory failure with hypercapnia: Secondary | ICD-10-CM | POA: Diagnosis present

## 2020-06-10 DIAGNOSIS — G909 Disorder of the autonomic nervous system, unspecified: Secondary | ICD-10-CM | POA: Diagnosis not present

## 2020-06-10 DIAGNOSIS — I469 Cardiac arrest, cause unspecified: Secondary | ICD-10-CM | POA: Diagnosis not present

## 2020-06-10 DIAGNOSIS — J9621 Acute and chronic respiratory failure with hypoxia: Secondary | ICD-10-CM | POA: Diagnosis not present

## 2020-06-10 DIAGNOSIS — S14109S Unspecified injury at unspecified level of cervical spinal cord, sequela: Secondary | ICD-10-CM

## 2020-06-10 DIAGNOSIS — N179 Acute kidney failure, unspecified: Secondary | ICD-10-CM | POA: Diagnosis not present

## 2020-06-10 DIAGNOSIS — J9601 Acute respiratory failure with hypoxia: Secondary | ICD-10-CM | POA: Diagnosis present

## 2020-06-10 LAB — URINE CULTURE: Culture: >=100000 — AB

## 2020-06-10 NOTE — Consult Note (Signed)
Pulmonary [place]  PULMONARY SERVICE  Date of Service: 06/09/2020  PULMONARY CRITICAL CARE Other [place]  FMB:[id number]  DOB: 05/09/1951   DOA: 06/07/2020  Referring Physician: Merton Border, MD  HPI: Ryan Day is a 69 y.o. male seen for follow up of Acute on Chronic Respiratory Failure.  Patient has multiple medical problems including hypertension angioedema hyperlipidemia who presented to the hospital because of a mechanical fall from standing position.  Patient apparently had been drinking and tripped over his ottoman striking his head and face on the TV stand.  At the time of initial presentation he was unable to move and had absence of rectal tone.  Assessment was done and patient apparently had C3-6 cord compression and edema and was found to be in spinal shock.  Patient underwent a decompression laminectomy from C3-6 was found to have small dural tear x2 which was repaired.  Postop course was complicated by failure to come off the ventilator had a bronchoscopy done because of mucous plugging and also was started on empiric antibiotics.  Subsequently patient was extubated however again had to be reintubated because of excessive secretions and also mucous plugging.  Patient apparently suffered a cardiac arrest after August 12 when he had had the tracheostomy and PEG placed.  Downtime was approximately 8 minutes of CPR.  He also did undergo cooling.  Main issue has been copious secretions he is now transferred to our facility for further management and weaning.  At the time of evaluation he is on T collar and has been on 28% FiO2.  Review of Systems:  ROS performed and is unremarkable other than noted above.  Past Medical History:  Diagnosis Date  . Elevated alkaline phosphatase level   . 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 Hyperlipidemia   . Hypertension     Past Surgical History:  Procedure Laterality Date  .  COLONOSCOPY WITH PROPOFOL N/A 12/05/2017   Procedure: COLONOSCOPY WITH PROPOFOL;  Surgeon: Lin Landsman, MD;  Location: [facility] ENDOSCOPY;  Service: Gastroenterology;  Laterality: N/A;  . ESOPHAGOGASTRODUODENOSCOPY  12/05/2017   Procedure: ESOPHAGOGASTRODUODENOSCOPY (EGD);  Surgeon: Lin Landsman, MD;  Location: [facility] ENDOSCOPY;  Service: Gastroenterology;;    Social History:    reports that he has never smoked. He has never used smokeless tobacco. He reports current alcohol use. He reports that he does not use drugs.  Family History: Non-Contributory to the present illness  No Known Allergies  Medications: Reviewed on Rounds  Physical Exam:  Vitals: Temperature is 96.9 pulse 62 respiratory 25 blood pressure is 134/78 saturations 98%  Ventilator Settings on T collar FiO2 28%  . General: Comfortable at this time . Eyes: Grossly normal lids, irises & conjunctiva . ENT: grossly tongue is normal . Neck: no obvious mass . Cardiovascular: S1-S2 normal no gallop or rub is noted . Respiratory: Coarse breath sounds with few rhonchi . Abdomen: Soft and nontender . Skin: no rash seen on limited exam . Musculoskeletal: not rigid . Psychiatric:unable to assess . Neurologic: no seizure no involuntary movements         Labs on Admission:  Basic Metabolic Panel: Recent Labs  Lab 06/08/20 0800 06/09/20 0700  NA 135 136  K 3.9 3.6  CL 100 101  CO2 26 27  GLUCOSE 119* 108*  BUN 21 19  CREATININE 0.74 0.81  CALCIUM 9.5 9.5  MG  --  2.1  PHOS  --  3.8  No results for input(s): PHART, PCO2ART, PO2ART, HCO3, O2SAT in the last 168 hours.  Liver Function Tests: Recent Labs  Lab 06/09/20 0700  AST 24  ALT 52*  ALKPHOS 131*  BILITOT 0.8  PROT 7.6  ALBUMIN 2.5*   No results for input(s): LIPASE, AMYLASE in the last 168 hours. No results for input(s): AMMONIA in the last 168 hours.  CBC: Recent Labs  Lab 06/08/20 0800 06/09/20 0700  WBC 7.8 7.2  NEUTROABS 5.6   --   HGB 9.3* 10.1*  HCT 28.3* 30.4*  MCV 91.0 91.3  PLT 235 256    Cardiac Enzymes: No results for input(s): CKTOTAL, CKMB, CKMBINDEX, TROPONINI in the last 168 hours.  BNP (last 3 results) No results for input(s): BNP in the last 8760 hours.  ProBNP (last 3 results) No results for input(s): PROBNP in the last 8760 hours.   Radiological Exams on Admission: DG ABDOMEN PEG TUBE LOCATION  Result Date: 06/07/2020 CLINICAL DATA:  Status post endoscopic gastrostomy placement EXAM: ABDOMEN - 1 VIEW COMPARISON:  None. FINDINGS: Single view radiograph of the abdomen demonstrates installation of radiopaque contrast in existing gastrostomy catheter within the left upper quadrant the abdomen which opacifies the gastric lumen and subsequently demonstrates passage of contrast into multiple nondilated loops of proximal jejunum. There is no extraluminal extension of contrast to suggest leak. Normal abdominal gas pattern. No organomegaly. No gross free intraperitoneal gas. IMPRESSION: Appropriately positioned gastrostomy catheter within the gastric lumen. No extraluminal extension of contrast to suggest leak. Electronically Signed   By: Fidela Salisbury MD   On: 06/07/2020 21:58   DG Chest Port 1 View  Result Date: 06/09/2020 CLINICAL DATA:  Pneumonia EXAM: PORTABLE CHEST 1 VIEW COMPARISON:  None. FINDINGS: Tracheostomy in place with the tip projecting over the mid trachea. Heart is borderline in size. Patchy right infrahilar basilar airspace opacity. Left lung clear. No effusions or pneumothorax. IMPRESSION: Right infrahilar airspace opacity concerning for pneumonia. Electronically Signed   By: Rolm Baptise M.D.   On: 06/09/2020 07:21    Assessment/Plan Active Problems:   Acute on chronic respiratory failure with hypoxia (HCC)   Autonomic instability   Cardiac arrest (HCC)   Cervical spinal cord injury, sequela (HCC)   Acute renal injury due to hypovolemia ([place])   1. Acute on chronic respiratory  failure with hypoxia patient now is off the vent on T collar has been on 28% FiO2.  Apparently still has a cuffed trach in place so we discussed during rounds to go ahead and switch his trach out he had a #8 we will downsized to a #6. 2. Cardiac arrest right now rhythm is stable we will continue to monitor closely he is at risk for bradycardia bradycardia arrhythmias. 3. autonomic instability patient has had ongoing issues with bradycardia hypotension is felt to be secondary to autonomic instability we will need to continue to monitor on telemetry. 4. C3-6 cord compression and edema status post decompression and laminectomy we will continue to monitor closely. 5. Acute renal failure patient had bouts of hypotension likely as a cause of the acute renal failure currently the patient's labs are back to baseline.  I have personally seen and evaluated the patient, evaluated laboratory and imaging results, formulated the assessment and plan and placed orders. The Patient requires high complexity decision making with multiple systems involvement.  Case was discussed on Rounds with the Respiratory Therapy Director and the Respiratory staff Time Spent 84minutes  Peta Peachey A Makailyn Mccormick, MD [facility] Pulmonary Critical  Care Medicine Sleep Medicine

## 2020-06-10 NOTE — Progress Notes (Addendum)
Pulmonary Critical Care Medicine [facility]   PULMONARY CRITICAL CARE SERVICE  PROGRESS NOTE  Date of Service: 06/10/2020  Nilo Fallin  [id number]:[id number]  DOB: May 20, 1951   DOA: 06/07/2020  Referring Physician: Merton Border, MD  HPI: Ryan Day is a 69 y.o. male seen for follow up of Acute on Chronic Respiratory Failure.  Patient mains on 20% aerosol trach collar using PMV with no difficulty.  Medications: Reviewed on Rounds  Physical Exam:  Vitals: Pulse 88 respirations 26 BP 108/66 O2 sat 98% temp 96.5  Ventilator Settings 28% ATC   General: Comfortable at this time  Eyes: Grossly normal lids, irises & conjunctiva  ENT: grossly tongue is normal  Neck: no obvious mass  Cardiovascular: S1 S2 normal no gallop  Respiratory: Coarse breath sounds  Abdomen: soft  Skin: no rash seen on limited exam  Musculoskeletal: not rigid  Psychiatric:unable to assess  Neurologic: no seizure no involuntary movements         Lab Data:   Basic Metabolic Panel: Recent Labs  Lab 06/08/20 0800 06/09/20 0700  NA 135 136  K 3.9 3.6  CL 100 101  CO2 26 27  GLUCOSE 119* 108*  BUN 21 19  CREATININE 0.74 0.81  CALCIUM 9.5 9.5  MG  --  2.1  PHOS  --  3.8    ABG: No results for input(s): PHART, PCO2ART, PO2ART, HCO3, O2SAT in the last 168 hours.  Liver Function Tests: Recent Labs  Lab 06/09/20 0700  AST 24  ALT 52*  ALKPHOS 131*  BILITOT 0.8  PROT 7.6  ALBUMIN 2.5*   No results for input(s): LIPASE, AMYLASE in the last 168 hours. No results for input(s): AMMONIA in the last 168 hours.  CBC: Recent Labs  Lab 06/08/20 0800 06/09/20 0700  WBC 7.8 7.2  NEUTROABS 5.6  --   HGB 9.3* 10.1*  HCT 28.3* 30.4*  MCV 91.0 91.3  PLT 235 256    Cardiac Enzymes: No results for input(s): CKTOTAL, CKMB, CKMBINDEX, TROPONINI in the last 168 hours.  BNP (last 3 results) No results for input(s): BNP in the last 8760 hours.  ProBNP (last 3 results) No  results for input(s): PROBNP in the last 8760 hours.  Radiological Exams: DG Chest Port 1 View  Result Date: 06/09/2020 CLINICAL DATA:  Pneumonia EXAM: PORTABLE CHEST 1 VIEW COMPARISON:  None. FINDINGS: Tracheostomy in place with the tip projecting over the mid trachea. Heart is borderline in size. Patchy right infrahilar basilar airspace opacity. Left lung clear. No effusions or pneumothorax. IMPRESSION: Right infrahilar airspace opacity concerning for pneumonia. Electronically Signed   By: Rolm Baptise M.D.   On: 06/09/2020 07:21    Assessment/Plan Active Problems:   Acute on chronic respiratory failure with hypoxia (HCC)   Autonomic instability   Cardiac arrest (HCC)   Cervical spinal cord injury, sequela (HCC)   Acute renal injury due to hypovolemia ([place])   1. Acute on chronic respiratory failure with hypoxia patient now is off the vent on T collar has been on 28% FiO2.  Using PMV with no difficulty we will continue supportive measures. 2. Cardiac arrest right now rhythm is stable we will continue to monitor closely he is at risk for bradycardia bradycardia arrhythmias. 3. autonomic instability patient has had ongoing issues with bradycardia hypotension is felt to be secondary to autonomic instability we will need to continue to monitor on telemetry. 4. C3-6 cord compression and edema status post decompression and laminectomy we will continue  to monitor closely. 5. Acute renal failure patient had bouts of hypotension likely as a cause of the acute renal failure currently the patient's labs are back to baseline.   I have personally seen and evaluated the patient, evaluated laboratory and imaging results, formulated the assessment and plan and placed orders. The Patient requires high complexity decision making with multiple systems involvement.  Rounds were done with the Respiratory Therapy Director and Staff therapists and discussed with nursing staff also.  Allyne Gee, MD  [facility] Pulmonary Critical Care Medicine Sleep Medicine

## 2020-06-11 DIAGNOSIS — G909 Disorder of the autonomic nervous system, unspecified: Secondary | ICD-10-CM | POA: Diagnosis not present

## 2020-06-11 DIAGNOSIS — N179 Acute kidney failure, unspecified: Secondary | ICD-10-CM | POA: Diagnosis not present

## 2020-06-11 DIAGNOSIS — I469 Cardiac arrest, cause unspecified: Secondary | ICD-10-CM | POA: Diagnosis not present

## 2020-06-11 DIAGNOSIS — J9621 Acute and chronic respiratory failure with hypoxia: Secondary | ICD-10-CM | POA: Diagnosis not present

## 2020-06-11 LAB — CULTURE, RESPIRATORY W GRAM STAIN: Culture: NORMAL

## 2020-06-11 NOTE — Progress Notes (Signed)
Pulmonary Critical Care Medicine [place]   PULMONARY CRITICAL CARE SERVICE  PROGRESS NOTE  Date of Service: 06/11/2020  Ryan Day  [id number]  DOB: 1951/07/08   DOA: 06/07/2020  Referring Physician: Merton Border, MD  HPI: Ryan Day is a 69 y.o. male seen for follow up of Acute on Chronic Respiratory Failure.  Patient is comfortable right now without distress has been afebrile  Medications: Reviewed on Rounds  Physical Exam:  Vitals: Temperature is 95.0 pulse 67 respiratory 22 blood pressure is 124/75 saturations 98%  Ventilator Settings patient has been on weaning protocol using PMV  . General: Comfortable at this time . Eyes: Grossly normal lids, irises & conjunctiva . ENT: grossly tongue is normal . Neck: no obvious mass . Cardiovascular: S1 S2 normal no gallop . Respiratory: No rhonchi . Abdomen: soft . Skin: no rash seen on limited exam . Musculoskeletal: not rigid . Psychiatric:unable to assess . Neurologic: no seizure no involuntary movements         Lab Data:   Basic Metabolic Panel: Recent Labs  Lab 06/08/20 0800 06/09/20 0700  NA 135 136  K 3.9 3.6  CL 100 101  CO2 26 27  GLUCOSE 119* 108*  BUN 21 19  CREATININE 0.74 0.81  CALCIUM 9.5 9.5  MG  --  2.1  PHOS  --  3.8    ABG: No results for input(s): PHART, PCO2ART, PO2ART, HCO3, O2SAT in the last 168 hours.  Liver Function Tests: Recent Labs  Lab 06/09/20 0700  AST 24  ALT 52*  ALKPHOS 131*  BILITOT 0.8  PROT 7.6  ALBUMIN 2.5*   No results for input(s): LIPASE, AMYLASE in the last 168 hours. No results for input(s): AMMONIA in the last 168 hours.  CBC: Recent Labs  Lab 06/08/20 0800 06/09/20 0700  WBC 7.8 7.2  NEUTROABS 5.6  --   HGB 9.3* 10.1*  HCT 28.3* 30.4*  MCV 91.0 91.3  PLT 235 256    Cardiac Enzymes: No results for input(s): CKTOTAL, CKMB, CKMBINDEX, TROPONINI in the last 168 hours.  BNP (last 3 results) No results for input(s): BNP in  the last 8760 hours.  ProBNP (last 3 results) No results for input(s): PROBNP in the last 8760 hours.  Radiological Exams: No results found.  Assessment/Plan Active Problems:   Acute on chronic respiratory failure with hypoxia (HCC)   Autonomic instability   Cardiac arrest (HCC)   Cervical spinal cord injury, sequela (HCC)   Acute renal injury due to hypovolemia ([place])   1. Acute on chronic respiratory failure with hypoxia we will continue with waiting on protocol.  Continue with PMV as tolerated. 2. Autonomic instability has been doing fine no episodes of bradycardias so far 3. Cervical cord injury prognosis guarded 4. Cardiac arrest rhythm has been stable 5. Acute renal failure we will continue to follow along   I have personally seen and evaluated the patient, evaluated laboratory and imaging results, formulated the assessment and plan and placed orders. The Patient requires high complexity decision making with multiple systems involvement.  Rounds were done with the Respiratory Therapy Director and Staff therapists and discussed with nursing staff also.  Allyne Gee, MD [facility] Pulmonary Critical Care Medicine Sleep Medicine

## 2020-06-12 DIAGNOSIS — G909 Disorder of the autonomic nervous system, unspecified: Secondary | ICD-10-CM | POA: Diagnosis not present

## 2020-06-12 DIAGNOSIS — I469 Cardiac arrest, cause unspecified: Secondary | ICD-10-CM | POA: Diagnosis not present

## 2020-06-12 DIAGNOSIS — N179 Acute kidney failure, unspecified: Secondary | ICD-10-CM | POA: Diagnosis not present

## 2020-06-12 DIAGNOSIS — J9621 Acute and chronic respiratory failure with hypoxia: Secondary | ICD-10-CM | POA: Diagnosis not present

## 2020-06-12 LAB — CBC
HCT: 31.1 % — ABNORMAL LOW (ref 39.0–52.0)
Hemoglobin: 9.8 g/dL — ABNORMAL LOW (ref 13.0–17.0)
MCH: 29.3 pg (ref 26.0–34.0)
MCHC: 31.5 g/dL (ref 30.0–36.0)
MCV: 92.8 fL (ref 80.0–100.0)
Platelets: 263 10*3/uL (ref 150–400)
RBC: 3.35 MIL/uL — ABNORMAL LOW (ref 4.22–5.81)
RDW: 15.7 % — ABNORMAL HIGH (ref 11.5–15.5)
WBC: 7.6 10*3/uL (ref 4.0–10.5)
nRBC: 0 % (ref 0.0–0.2)

## 2020-06-12 LAB — BASIC METABOLIC PANEL
Anion gap: 9 (ref 5–15)
BUN: 26 mg/dL — ABNORMAL HIGH (ref 8–23)
CO2: 27 mmol/L (ref 22–32)
Calcium: 10.1 mg/dL (ref 8.9–10.3)
Chloride: 109 mmol/L (ref 98–111)
Creatinine, Ser: 0.82 mg/dL (ref 0.61–1.24)
GFR calc Af Amer: >60 mL/min (ref >60)
GFR calc non Af Amer: >60 mL/min (ref >60)
Glucose, Bld: 119 mg/dL — ABNORMAL HIGH (ref 70–99)
Potassium: 4.1 mmol/L (ref 3.5–5.1)
Sodium: 145 mmol/L (ref 135–145)

## 2020-06-12 LAB — MAGNESIUM: Magnesium: 2.3 mg/dL (ref 1.7–2.4)

## 2020-06-12 LAB — PHOSPHORUS: Phosphorus: 3.9 mg/dL (ref 2.5–4.6)

## 2020-06-12 NOTE — Progress Notes (Signed)
Pulmonary Critical Care Medicine [place]   PULMONARY CRITICAL CARE SERVICE  PROGRESS NOTE  Date of Service: 06/12/2020  Rayshard Schirtzinger  [id number]  DOB: July 02, 1951   DOA: 06/07/2020  Referring Physician: Merton Border, MD  HPI: Ryan Day is a 69 y.o. male seen for follow up of Acute on Chronic Respiratory Failure.  Patient is currently on T collar has been on 28% FiO2 using the PMV he is able to speak but the voice is very weak.  In addition has a very very weak cough  Medications: Reviewed on Rounds  Physical Exam:  Vitals: Temperature is 95.6 pulse 55 respiratory rate 20 blood pressure is 128/78 saturations 99%  Ventilator Settings on T collar with an FiO2 of 28% using PMV  . General: Comfortable at this time . Eyes: Grossly normal lids, irises & conjunctiva . ENT: grossly tongue is normal . Neck: no obvious mass . Cardiovascular: S1 S2 normal no gallop . Respiratory: No rhonchi very coarse breath sounds . Abdomen: soft . Skin: no rash seen on limited exam . Musculoskeletal: not rigid . Psychiatric:unable to assess . Neurologic: no seizure no involuntary movements         Lab Data:   Basic Metabolic Panel: Recent Labs  Lab 06/08/20 0800 06/09/20 0700 06/12/20 0436  NA 135 136 145  K 3.9 3.6 4.1  CL 100 101 109  CO2 26 27 27   GLUCOSE 119* 108* 119*  BUN 21 19 26*  CREATININE 0.74 0.81 0.82  CALCIUM 9.5 9.5 10.1  MG  --  2.1 2.3  PHOS  --  3.8 3.9    ABG: No results for input(s): PHART, PCO2ART, PO2ART, HCO3, O2SAT in the last 168 hours.  Liver Function Tests: Recent Labs  Lab 06/09/20 0700  AST 24  ALT 52*  ALKPHOS 131*  BILITOT 0.8  PROT 7.6  ALBUMIN 2.5*   No results for input(s): LIPASE, AMYLASE in the last 168 hours. No results for input(s): AMMONIA in the last 168 hours.  CBC: Recent Labs  Lab 06/08/20 0800 06/09/20 0700 06/12/20 0436  WBC 7.8 7.2 7.6  NEUTROABS 5.6  --   --   HGB 9.3* 10.1* 9.8*  HCT 28.3*  30.4* 31.1*  MCV 91.0 91.3 92.8  PLT 235 256 263    Cardiac Enzymes: No results for input(s): CKTOTAL, CKMB, CKMBINDEX, TROPONINI in the last 168 hours.  BNP (last 3 results) No results for input(s): BNP in the last 8760 hours.  ProBNP (last 3 results) No results for input(s): PROBNP in the last 8760 hours.  Radiological Exams: No results found.  Assessment/Plan Active Problems:   Acute on chronic respiratory failure with hypoxia (HCC)   Autonomic instability   Cardiac arrest (HCC)   Cervical spinal cord injury, sequela (HCC)   Acute renal injury due to hypovolemia ([place])   1. Acute on chronic respiratory failure with hypoxia we will continue T collar and PMV not ready for capping or even considering decannulation 2. Autonomic instability monitoring heart rhythm 3. Cardiac arrest rhythm has been stable we will follow 4. Cervical injury supportive care patient has significantly weakened respiratory muscles 5. Acute renal failure monitoring labs has resolved   I have personally seen and evaluated the patient, evaluated laboratory and imaging results, formulated the assessment and plan and placed orders. The Patient requires high complexity decision making with multiple systems involvement.  Rounds were done with the Respiratory Therapy Director and Staff therapists and discussed with nursing staff also.  Allyne Gee, MD [facility] Pulmonary Critical Care Medicine Sleep Medicine

## 2020-06-13 DIAGNOSIS — G909 Disorder of the autonomic nervous system, unspecified: Secondary | ICD-10-CM | POA: Diagnosis not present

## 2020-06-13 DIAGNOSIS — I469 Cardiac arrest, cause unspecified: Secondary | ICD-10-CM | POA: Diagnosis not present

## 2020-06-13 DIAGNOSIS — J9621 Acute and chronic respiratory failure with hypoxia: Secondary | ICD-10-CM | POA: Diagnosis not present

## 2020-06-13 DIAGNOSIS — N179 Acute kidney failure, unspecified: Secondary | ICD-10-CM | POA: Diagnosis not present

## 2020-06-13 NOTE — Progress Notes (Signed)
Pulmonary Critical Care Medicine [place]   PULMONARY CRITICAL CARE SERVICE  PROGRESS NOTE  Date of Service: 06/13/2020  Ryan Day  [id number]  DOB: 1950/09/27   DOA: 06/07/2020  Referring Physician: Merton Border, MD  HPI: Ryan Day is a 69 y.o. male seen for follow up of Acute on Chronic Respiratory Failure.  Patient currently is on T collar has been on 28% FiO2 using PMV  Medications: Reviewed on Rounds  Physical Exam:  Vitals: Temperature is 96.0 pulse 62 respiratory 22 blood pressure is 97/65 saturations 98%  Ventilator Settings right now is on T collar FiO2 20% with PMV  . General: Comfortable at this time . Eyes: Grossly normal lids, irises & conjunctiva . ENT: grossly tongue is normal . Neck: no obvious mass . Cardiovascular: S1 S2 normal no gallop . Respiratory: No rhonchi or rales are noted at this time . Abdomen: soft . Skin: no rash seen on limited exam . Musculoskeletal: not rigid . Psychiatric:unable to assess . Neurologic: no seizure no involuntary movements         Lab Data:   Basic Metabolic Panel: Recent Labs  Lab 06/08/20 0800 06/09/20 0700 06/12/20 0436  NA 135 136 145  K 3.9 3.6 4.1  CL 100 101 109  CO2 26 27 27   GLUCOSE 119* 108* 119*  BUN 21 19 26*  CREATININE 0.74 0.81 0.82  CALCIUM 9.5 9.5 10.1  MG  --  2.1 2.3  PHOS  --  3.8 3.9    ABG: No results for input(s): PHART, PCO2ART, PO2ART, HCO3, O2SAT in the last 168 hours.  Liver Function Tests: Recent Labs  Lab 06/09/20 0700  AST 24  ALT 52*  ALKPHOS 131*  BILITOT 0.8  PROT 7.6  ALBUMIN 2.5*   No results for input(s): LIPASE, AMYLASE in the last 168 hours. No results for input(s): AMMONIA in the last 168 hours.  CBC: Recent Labs  Lab 06/08/20 0800 06/09/20 0700 06/12/20 0436  WBC 7.8 7.2 7.6  NEUTROABS 5.6  --   --   HGB 9.3* 10.1* 9.8*  HCT 28.3* 30.4* 31.1*  MCV 91.0 91.3 92.8  PLT 235 256 263    Cardiac Enzymes: No results for  input(s): CKTOTAL, CKMB, CKMBINDEX, TROPONINI in the last 168 hours.  BNP (last 3 results) No results for input(s): BNP in the last 8760 hours.  ProBNP (last 3 results) No results for input(s): PROBNP in the last 8760 hours.  Radiological Exams: No results found.  Assessment/Plan Active Problems:   Acute on chronic respiratory failure with hypoxia (HCC)   Autonomic instability   Cardiac arrest (HCC)   Cervical spinal cord injury, sequela (HCC)   Acute renal injury due to hypovolemia ([place])   1. Acute on chronic respiratory failure hypoxia we will continue with the T collar and PMV patient has a very weak cough needs ongoing pulmonary toileting 2. Autonomic instability stable at this time we will continue to monitor 3. Cardiac arrest rhythm has been stable 4. Cervical injury high lesions supportive care 5. Acute renal failure resolved we will monitor   I have personally seen and evaluated the patient, evaluated laboratory and imaging results, formulated the assessment and plan and placed orders. The Patient requires high complexity decision making with multiple systems involvement.  Rounds were done with the Respiratory Therapy Director and Staff therapists and discussed with nursing staff also.  Allyne Gee, MD [facility] Pulmonary Critical Care Medicine Sleep Medicine

## 2020-06-14 ENCOUNTER — Other Ambulatory Visit (HOSPITAL_COMMUNITY): Payer: BC Managed Care – PPO

## 2020-06-14 DIAGNOSIS — J9621 Acute and chronic respiratory failure with hypoxia: Secondary | ICD-10-CM | POA: Diagnosis not present

## 2020-06-14 DIAGNOSIS — G909 Disorder of the autonomic nervous system, unspecified: Secondary | ICD-10-CM | POA: Diagnosis not present

## 2020-06-14 DIAGNOSIS — I469 Cardiac arrest, cause unspecified: Secondary | ICD-10-CM | POA: Diagnosis not present

## 2020-06-14 DIAGNOSIS — N179 Acute kidney failure, unspecified: Secondary | ICD-10-CM | POA: Diagnosis not present

## 2020-06-14 NOTE — Progress Notes (Signed)
Pulmonary Critical Care Medicine [place]   PULMONARY CRITICAL CARE SERVICE  PROGRESS NOTE  Date of Service: 06/14/2020  Ryan Day  OIZ:[id number]  DOB: Mar 01, 1951   DOA: 06/07/2020  Referring Physician: Merton Border, MD  HPI: Ryan Day is a 69 y.o. male seen for follow up of Acute on Chronic Respiratory Failure.  Patient is currently on T collar has been on 28% FiO2 had a swallowing assessment done which the patient did not pass  Medications: Reviewed on Rounds  Physical Exam:  Vitals: Temperature 97.5 pulse 82 respiratory 18 blood pressure is 137/75 saturations 98%  Ventilator Settings on T collar with an FiO2 28%  . General: Comfortable at this time . Eyes: Grossly normal lids, irises & conjunctiva . ENT: grossly tongue is normal . Neck: no obvious mass . Cardiovascular: S1 S2 normal no gallop . Respiratory: No rhonchi no rales are noted . Abdomen: soft . Skin: no rash seen on limited exam . Musculoskeletal: not rigid . Psychiatric:unable to assess . Neurologic: no seizure no involuntary movements         Lab Data:   Basic Metabolic Panel: Recent Labs  Lab 06/08/20 0800 06/09/20 0700 06/12/20 0436  NA 135 136 145  K 3.9 3.6 4.1  CL 100 101 109  CO2 26 27 27   GLUCOSE 119* 108* 119*  BUN 21 19 26*  CREATININE 0.74 0.81 0.82  CALCIUM 9.5 9.5 10.1  MG  --  2.1 2.3  PHOS  --  3.8 3.9    ABG: No results for input(s): PHART, PCO2ART, PO2ART, HCO3, O2SAT in the last 168 hours.  Liver Function Tests: Recent Labs  Lab 06/09/20 0700  AST 24  ALT 52*  ALKPHOS 131*  BILITOT 0.8  PROT 7.6  ALBUMIN 2.5*   No results for input(s): LIPASE, AMYLASE in the last 168 hours. No results for input(s): AMMONIA in the last 168 hours.  CBC: Recent Labs  Lab 06/08/20 0800 06/09/20 0700 06/12/20 0436  WBC 7.8 7.2 7.6  NEUTROABS 5.6  --   --   HGB 9.3* 10.1* 9.8*  HCT 28.3* 30.4* 31.1*  MCV 91.0 91.3 92.8  PLT 235 256 263    Cardiac  Enzymes: No results for input(s): CKTOTAL, CKMB, CKMBINDEX, TROPONINI in the last 168 hours.  BNP (last 3 results) No results for input(s): BNP in the last 8760 hours.  ProBNP (last 3 results) No results for input(s): PROBNP in the last 8760 hours.  Radiological Exams: No results found.  Assessment/Plan Active Problems:   Acute on chronic respiratory failure with hypoxia (HCC)   Autonomic instability   Cardiac arrest (HCC)   Cervical spinal cord injury, sequela (HCC)   Acute renal injury due to hypovolemia ([place])   1. Acute on chronic respiratory failure with hypoxia we will continue with T collar trials patient did not pass the swallowing assessment 2. Autonomic instability monitoring telemetry 3. Cardiac arrest rhythm has been stable 4. Cervical spinal cord injury no change 5. Acute renal failure with hypovolemia we will continue with present management   I have personally seen and evaluated the patient, evaluated laboratory and imaging results, formulated the assessment and plan and placed orders. The Patient requires high complexity decision making with multiple systems involvement.  Rounds were done with the Respiratory Therapy Director and Staff therapists and discussed with nursing staff also.  Allyne Gee, MD [facility] Pulmonary Critical Care Medicine Sleep Medicine

## 2020-06-15 DIAGNOSIS — N179 Acute kidney failure, unspecified: Secondary | ICD-10-CM

## 2020-06-15 DIAGNOSIS — E861 Hypovolemia: Secondary | ICD-10-CM

## 2020-06-15 DIAGNOSIS — J9621 Acute and chronic respiratory failure with hypoxia: Secondary | ICD-10-CM

## 2020-06-15 DIAGNOSIS — I469 Cardiac arrest, cause unspecified: Secondary | ICD-10-CM

## 2020-06-15 DIAGNOSIS — G909 Disorder of the autonomic nervous system, unspecified: Secondary | ICD-10-CM

## 2020-06-15 NOTE — Progress Notes (Signed)
Pulmonary Critical Care Medicine [place]   PULMONARY CRITICAL CARE SERVICE  PROGRESS NOTE  Date of Service: 06/15/2020  Joneric Streight  PYP:[id number]  DOB: 05-Jan-1951   DOA: 06/07/2020  Referring Physician: Merton Border, MD  HPI: Ryan Day is a 69 y.o. male seen for follow up of Acute on Chronic Respiratory Failure.  Patient currently is on T collar has been on 28% FiO2 this is baseline unlikely to recover as far as neurological status is concerned  Medications: Reviewed on Rounds  Physical Exam:  Vitals: Temperature 96.7 pulse 64 respiratory 24 blood pressure is 98/53 saturations 99%  Ventilator Settings off the ventilator on T collar with an FiO2 of 28%  . General: Comfortable at this time . Eyes: Grossly normal lids, irises & conjunctiva . ENT: grossly tongue is normal . Neck: no obvious mass . Cardiovascular: S1 S2 normal no gallop . Respiratory: Scattered rhonchi noted bilaterally . Abdomen: soft . Skin: no rash seen on limited exam . Musculoskeletal: not rigid . Psychiatric:unable to assess . Neurologic: no seizure no involuntary movements         Lab Data:   Basic Metabolic Panel: Recent Labs  Lab 06/09/20 0700 06/12/20 0436  NA 136 145  K 3.6 4.1  CL 101 109  CO2 27 27  GLUCOSE 108* 119*  BUN 19 26*  CREATININE 0.81 0.82  CALCIUM 9.5 10.1  MG 2.1 2.3  PHOS 3.8 3.9    ABG: No results for input(s): PHART, PCO2ART, PO2ART, HCO3, O2SAT in the last 168 hours.  Liver Function Tests: Recent Labs  Lab 06/09/20 0700  AST 24  ALT 52*  ALKPHOS 131*  BILITOT 0.8  PROT 7.6  ALBUMIN 2.5*   No results for input(s): LIPASE, AMYLASE in the last 168 hours. No results for input(s): AMMONIA in the last 168 hours.  CBC: Recent Labs  Lab 06/09/20 0700 06/12/20 0436  WBC 7.2 7.6  HGB 10.1* 9.8*  HCT 30.4* 31.1*  MCV 91.3 92.8  PLT 256 263    Cardiac Enzymes: No results for input(s): CKTOTAL, CKMB, CKMBINDEX, TROPONINI in the  last 168 hours.  BNP (last 3 results) No results for input(s): BNP in the last 8760 hours.  ProBNP (last 3 results) No results for input(s): PROBNP in the last 8760 hours.  Radiological Exams: No results found.  Assessment/Plan Active Problems:   Acute on chronic respiratory failure with hypoxia (HCC)   Autonomic instability   Cardiac arrest (HCC)   Cervical spinal cord injury, sequela (HCC)   Acute renal injury due to hypovolemia ([place])   1. Acute on chronic respiratory failure with hypoxia we will continue with T collar trials likely his baseline patient not showing much in the way of recovery of neurological status. 2. Problem with instability we will continue to monitor. 3. Cardiac arrest rhythm has been stable we will continue to follow 4. Cervical spinal cord injury no change continue with supportive care 5. Acute renal failure monitoring fluid status   I have personally seen and evaluated the patient, evaluated laboratory and imaging results, formulated the assessment and plan and placed orders. The Patient requires high complexity decision making with multiple systems involvement.  Rounds were done with the Respiratory Therapy Director and Staff therapists and discussed with nursing staff also.  Allyne Gee, MD [facility] Pulmonary Critical Care Medicine Sleep Medicine

## 2020-06-16 DIAGNOSIS — G909 Disorder of the autonomic nervous system, unspecified: Secondary | ICD-10-CM | POA: Diagnosis not present

## 2020-06-16 DIAGNOSIS — N179 Acute kidney failure, unspecified: Secondary | ICD-10-CM | POA: Diagnosis not present

## 2020-06-16 DIAGNOSIS — J9621 Acute and chronic respiratory failure with hypoxia: Secondary | ICD-10-CM | POA: Diagnosis not present

## 2020-06-16 DIAGNOSIS — I469 Cardiac arrest, cause unspecified: Secondary | ICD-10-CM | POA: Diagnosis not present

## 2020-06-16 LAB — COMPREHENSIVE METABOLIC PANEL
ALT: 29 U/L (ref 0–44)
AST: 19 U/L (ref 15–41)
Albumin: 2.7 g/dL — ABNORMAL LOW (ref 3.5–5.0)
Alkaline Phosphatase: 145 U/L — ABNORMAL HIGH (ref 38–126)
Anion gap: 8 (ref 5–15)
BUN: 30 mg/dL — ABNORMAL HIGH (ref 8–23)
CO2: 28 mmol/L (ref 22–32)
Calcium: 10.3 mg/dL (ref 8.9–10.3)
Chloride: 117 mmol/L — ABNORMAL HIGH (ref 98–111)
Creatinine, Ser: 0.83 mg/dL (ref 0.61–1.24)
GFR calc Af Amer: >60 mL/min (ref >60)
GFR calc non Af Amer: >60 mL/min (ref >60)
Glucose, Bld: 130 mg/dL — ABNORMAL HIGH (ref 70–99)
Potassium: 3.8 mmol/L (ref 3.5–5.1)
Sodium: 153 mmol/L — ABNORMAL HIGH (ref 135–145)
Total Bilirubin: 0.7 mg/dL (ref 0.3–1.2)
Total Protein: 8 g/dL (ref 6.5–8.1)

## 2020-06-16 LAB — CBC
HCT: 35.4 % — ABNORMAL LOW (ref 39.0–52.0)
Hemoglobin: 10.8 g/dL — ABNORMAL LOW (ref 13.0–17.0)
MCH: 29.3 pg (ref 26.0–34.0)
MCHC: 30.5 g/dL (ref 30.0–36.0)
MCV: 95.9 fL (ref 80.0–100.0)
Platelets: 297 10*3/uL (ref 150–400)
RBC: 3.69 MIL/uL — ABNORMAL LOW (ref 4.22–5.81)
RDW: 15.6 % — ABNORMAL HIGH (ref 11.5–15.5)
WBC: 7.3 10*3/uL (ref 4.0–10.5)
nRBC: 0 % (ref 0.0–0.2)

## 2020-06-16 LAB — MAGNESIUM: Magnesium: 2.3 mg/dL (ref 1.7–2.4)

## 2020-06-16 NOTE — Progress Notes (Addendum)
Pulmonary Critical Care Medicine [place]   PULMONARY CRITICAL CARE SERVICE  PROGRESS NOTE  Date of Service: 06/16/2020  Ryan Day  [id number]:[id number]  DOB: 05/07/1951   DOA: 06/07/2020  Referring Physician: Merton Border, MD  HPI: [place] Ryan Day is a 69 y.o. male seen for follow up of Acute on Chronic Respiratory Failure.  Patient mains on 28% T-bar satting well no fever or distress using PMV with no difficulty satting well this time.  Medications: Reviewed on Rounds  Physical Exam:  Vitals: Pulse 60 respiration 22 BP 115/62 O2 sat 99% temp 95.9  Ventilator Settings 28% ATC  . General: Comfortable at this time . Eyes: Grossly normal lids, irises & conjunctiva . ENT: grossly tongue is normal . Neck: no obvious mass . Cardiovascular: S1 S2 normal no gallop . Respiratory: Coarse breath sounds . Abdomen: soft . Skin: no rash seen on limited exam . Musculoskeletal: not rigid . Psychiatric:unable to assess . Neurologic: no seizure no involuntary movements         Lab Data:   Basic Metabolic Panel: Recent Labs  Lab 06/12/20 0436  NA 145  K 4.1  CL 109  CO2 27  GLUCOSE 119*  BUN 26*  CREATININE 0.82  CALCIUM 10.1  MG 2.3  PHOS 3.9    ABG: No results for input(s): PHART, PCO2ART, PO2ART, HCO3, O2SAT in the last 168 hours.  Liver Function Tests: No results for input(s): AST, ALT, ALKPHOS, BILITOT, PROT, ALBUMIN in the last 168 hours. No results for input(s): LIPASE, AMYLASE in the last 168 hours. No results for input(s): AMMONIA in the last 168 hours.  CBC: Recent Labs  Lab 06/12/20 0436  WBC 7.6  HGB 9.8*  HCT 31.1*  MCV 92.8  PLT 263    Cardiac Enzymes: No results for input(s): CKTOTAL, CKMB, CKMBINDEX, TROPONINI in the last 168 hours.  BNP (last 3 results) No results for input(s): BNP in the last 8760 hours.  ProBNP (last 3 results) No results for input(s): PROBNP in the last 8760 hours.  Radiological Exams: No results  found.  Assessment/Plan Active Problems:   Acute on chronic respiratory failure with hypoxia (HCC)   Autonomic instability   Cardiac arrest (HCC)   Cervical spinal cord injury, sequela (HCC)   Acute renal injury due to hypovolemia ([place])   1. Acute on chronic respiratory failure with hypoxia we will continue with T collar trials likely his baseline patient not showing much in the way of recovery of neurological status. 2. Problem with instability we will continue to monitor. 3. Cardiac arrest rhythm has been stable we will continue to follow 4. Cervical spinal cord injury no change continue with supportive care 5. Acute renal failure monitoring fluid status   I have personally seen and evaluated the patient, evaluated laboratory and imaging results, formulated the assessment and plan and placed orders. The Patient requires high complexity decision making with multiple systems involvement.  Rounds were done with the Respiratory Therapy Director and Staff therapists and discussed with nursing staff also.  Ryan Gee, MD [facility] Pulmonary Critical Care Medicine Sleep Medicine

## 2020-06-17 DIAGNOSIS — J9621 Acute and chronic respiratory failure with hypoxia: Secondary | ICD-10-CM | POA: Diagnosis not present

## 2020-06-17 DIAGNOSIS — N179 Acute kidney failure, unspecified: Secondary | ICD-10-CM | POA: Diagnosis not present

## 2020-06-17 DIAGNOSIS — G909 Disorder of the autonomic nervous system, unspecified: Secondary | ICD-10-CM | POA: Diagnosis not present

## 2020-06-17 DIAGNOSIS — I469 Cardiac arrest, cause unspecified: Secondary | ICD-10-CM | POA: Diagnosis not present

## 2020-06-17 NOTE — Progress Notes (Signed)
Pulmonary Critical Care Medicine [place]   PULMONARY CRITICAL CARE SERVICE  PROGRESS NOTE  Date of Service: 06/17/2020  Ryan Day  [id number]  DOB: 1951-02-07   DOA: 06/07/2020  Referring Physician: Merton Border, MD  HPI: Ryan Day is a 69 y.o. male seen for follow up of Acute on Chronic Respiratory Failure.  Patient right now is on T collar on 28% FiO2 with good saturations.  Medications: Reviewed on Rounds  Physical Exam:  Vitals: Temperature is 98.6 pulse 60 respiratory rate is 18 blood pressure is 108/62 saturations 99%  Ventilator Settings on T collar with an FiO2 20%  . General: Comfortable at this time . Eyes: Grossly normal lids, irises & conjunctiva . ENT: grossly tongue is normal . Neck: no obvious mass . Cardiovascular: S1 S2 normal no gallop . Respiratory: No rhonchi no rales noted . Abdomen: soft . Skin: no rash seen on limited exam . Musculoskeletal: not rigid . Psychiatric:unable to assess . Neurologic: no seizure no involuntary movements         Lab Data:   Basic Metabolic Panel: Recent Labs  Lab 06/12/20 0436 06/16/20 1518  NA 145 153*  K 4.1 3.8  CL 109 117*  CO2 27 28  GLUCOSE 119* 130*  BUN 26* 30*  CREATININE 0.82 0.83  CALCIUM 10.1 10.3  MG 2.3 2.3  PHOS 3.9  --     ABG: No results for input(s): PHART, PCO2ART, PO2ART, HCO3, O2SAT in the last 168 hours.  Liver Function Tests: Recent Labs  Lab 06/16/20 1518  AST 19  ALT 29  ALKPHOS 145*  BILITOT 0.7  PROT 8.0  ALBUMIN 2.7*   No results for input(s): LIPASE, AMYLASE in the last 168 hours. No results for input(s): AMMONIA in the last 168 hours.  CBC: Recent Labs  Lab 06/12/20 0436 06/16/20 1509  WBC 7.6 7.3  HGB 9.8* 10.8*  HCT 31.1* 35.4*  MCV 92.8 95.9  PLT 263 297    Cardiac Enzymes: No results for input(s): CKTOTAL, CKMB, CKMBINDEX, TROPONINI in the last 168 hours.  BNP (last 3 results) No results for input(s): BNP in the last  8760 hours.  ProBNP (last 3 results) No results for input(s): PROBNP in the last 8760 hours.  Radiological Exams: No results found.  Assessment/Plan Active Problems:   Acute on chronic respiratory failure with hypoxia (HCC)   Autonomic instability   Cardiac arrest (HCC)   Cervical spinal cord injury, sequela (HCC)   Acute renal injury due to hypovolemia ([place])   1. Acute on chronic respiratory failure with hypoxia we will continue with T collar trials titrate oxygen continue pulmonary toilet 2. Autonomic instability continue to monitor 3. Cardiac arrest rhythm has been stable 4. Cervical spinal cord injury no change 5. Acute renal failure continue supportive care   I have personally seen and evaluated the patient, evaluated laboratory and imaging results, formulated the assessment and plan and placed orders. The Patient requires high complexity decision making with multiple systems involvement.  Rounds were done with the Respiratory Therapy Director and Staff therapists and discussed with nursing staff also.  Allyne Gee, MD [facility] Pulmonary Critical Care Medicine Sleep Medicine

## 2020-06-18 DIAGNOSIS — I469 Cardiac arrest, cause unspecified: Secondary | ICD-10-CM | POA: Diagnosis not present

## 2020-06-18 DIAGNOSIS — J9621 Acute and chronic respiratory failure with hypoxia: Secondary | ICD-10-CM | POA: Diagnosis not present

## 2020-06-18 DIAGNOSIS — N179 Acute kidney failure, unspecified: Secondary | ICD-10-CM | POA: Diagnosis not present

## 2020-06-18 DIAGNOSIS — G909 Disorder of the autonomic nervous system, unspecified: Secondary | ICD-10-CM | POA: Diagnosis not present

## 2020-06-18 LAB — BASIC METABOLIC PANEL
Anion gap: 12 (ref 5–15)
BUN: 30 mg/dL — ABNORMAL HIGH (ref 8–23)
CO2: 27 mmol/L (ref 22–32)
Calcium: 10.5 mg/dL — ABNORMAL HIGH (ref 8.9–10.3)
Chloride: 113 mmol/L — ABNORMAL HIGH (ref 98–111)
Creatinine, Ser: 1.08 mg/dL (ref 0.61–1.24)
GFR calc Af Amer: >60 mL/min (ref >60)
GFR calc non Af Amer: >60 mL/min (ref >60)
Glucose, Bld: 122 mg/dL — ABNORMAL HIGH (ref 70–99)
Potassium: 4 mmol/L (ref 3.5–5.1)
Sodium: 152 mmol/L — ABNORMAL HIGH (ref 135–145)

## 2020-06-18 NOTE — Progress Notes (Signed)
Pulmonary Critical Care Medicine [place]   PULMONARY CRITICAL CARE SERVICE  PROGRESS NOTE  Date of Service: 06/18/2020  Ryan Day  [id number]:[id number]  DOB: Feb 16, 1951   DOA: 06/07/2020  Referring Physician: Merton Border, MD  HPI: Ryan Day is a 69 y.o. male seen for follow up of Acute on Chronic Respiratory Failure.  Currently is on T collar has been on 20% FiO2 using the PMV  Medications: Reviewed on Rounds  Physical Exam:  Vitals: Temperature is 100.0 pulse 97 respiratory rate 18 blood pressure is 123/72 saturations 97%  Ventilator Settings on T collar FiO2 28%  . General: Comfortable at this time . Eyes: Grossly normal lids, irises & conjunctiva . ENT: grossly tongue is normal . Neck: no obvious mass . Cardiovascular: S1 S2 normal no gallop . Respiratory: Scattered rhonchi expansion is equal . Abdomen: soft . Skin: no rash seen on limited exam . Musculoskeletal: not rigid . Psychiatric:unable to assess . Neurologic: no seizure no involuntary movements         Lab Data:   Basic Metabolic Panel: Recent Labs  Lab 06/12/20 0436 06/16/20 1518 06/18/20 1012  NA 145 153* 152*  K 4.1 3.8 4.0  CL 109 117* 113*  CO2 27 28 27   GLUCOSE 119* 130* 122*  BUN 26* 30* 30*  CREATININE 0.82 0.83 1.08  CALCIUM 10.1 10.3 10.5*  MG 2.3 2.3  --   PHOS 3.9  --   --     ABG: No results for input(s): PHART, PCO2ART, PO2ART, HCO3, O2SAT in the last 168 hours.  Liver Function Tests: Recent Labs  Lab 06/16/20 1518  AST 19  ALT 29  ALKPHOS 145*  BILITOT 0.7  PROT 8.0  ALBUMIN 2.7*   No results for input(s): LIPASE, AMYLASE in the last 168 hours. No results for input(s): AMMONIA in the last 168 hours.  CBC: Recent Labs  Lab 06/12/20 0436 06/16/20 1509  WBC 7.6 7.3  HGB 9.8* 10.8*  HCT 31.1* 35.4*  MCV 92.8 95.9  PLT 263 297    Cardiac Enzymes: No results for input(s): CKTOTAL, CKMB, CKMBINDEX, TROPONINI in the last 168 hours.  BNP (last  3 results) No results for input(s): BNP in the last 8760 hours.  ProBNP (last 3 results) No results for input(s): PROBNP in the last 8760 hours.  Radiological Exams: No results found.  Assessment/Plan Active Problems:   Acute on chronic respiratory failure with hypoxia (HCC)   Autonomic instability   Cardiac arrest (HCC)   Cervical spinal cord injury, sequela (HCC)   Acute renal injury due to hypovolemia ([place])   1. Acute on chronic respiratory failure hypoxia we will continue with T collar trials titrate oxygen continue pulmonary toilet. 2. Autonomic instability no change we will continue to follow 3. Cardiac arrest rhythm is stable 4. Cervical injury high lesion supportive care 5. Acute renal failure resolved   I have personally seen and evaluated the patient, evaluated laboratory and imaging results, formulated the assessment and plan and placed orders. The Patient requires high complexity decision making with multiple systems involvement.  Rounds were done with the Respiratory Therapy Director and Staff therapists and discussed with nursing staff also.  Allyne Gee, MD [facility] Pulmonary Critical Care Medicine Sleep Medicine

## 2020-06-19 ENCOUNTER — Other Ambulatory Visit (HOSPITAL_COMMUNITY): Payer: BC Managed Care – PPO

## 2020-06-19 DIAGNOSIS — I469 Cardiac arrest, cause unspecified: Secondary | ICD-10-CM | POA: Diagnosis not present

## 2020-06-19 DIAGNOSIS — G909 Disorder of the autonomic nervous system, unspecified: Secondary | ICD-10-CM | POA: Diagnosis not present

## 2020-06-19 DIAGNOSIS — J9621 Acute and chronic respiratory failure with hypoxia: Secondary | ICD-10-CM | POA: Diagnosis not present

## 2020-06-19 DIAGNOSIS — N179 Acute kidney failure, unspecified: Secondary | ICD-10-CM | POA: Diagnosis not present

## 2020-06-19 LAB — RENAL FUNCTION PANEL
Albumin: 2.6 g/dL — ABNORMAL LOW (ref 3.5–5.0)
Anion gap: 9 (ref 5–15)
BUN: 28 mg/dL — ABNORMAL HIGH (ref 8–23)
CO2: 26 mmol/L (ref 22–32)
Calcium: 10.2 mg/dL (ref 8.9–10.3)
Chloride: 113 mmol/L — ABNORMAL HIGH (ref 98–111)
Creatinine, Ser: 0.83 mg/dL (ref 0.61–1.24)
GFR calc Af Amer: >60 mL/min (ref >60)
GFR calc non Af Amer: >60 mL/min (ref >60)
Glucose, Bld: 145 mg/dL — ABNORMAL HIGH (ref 70–99)
Phosphorus: 3.4 mg/dL (ref 2.5–4.6)
Potassium: 3.5 mmol/L (ref 3.5–5.1)
Sodium: 148 mmol/L — ABNORMAL HIGH (ref 135–145)

## 2020-06-19 LAB — CBC
HCT: 34.1 % — ABNORMAL LOW (ref 39.0–52.0)
Hemoglobin: 10.3 g/dL — ABNORMAL LOW (ref 13.0–17.0)
MCH: 29.1 pg (ref 26.0–34.0)
MCHC: 30.2 g/dL (ref 30.0–36.0)
MCV: 96.3 fL (ref 80.0–100.0)
Platelets: 226 10*3/uL (ref 150–400)
RBC: 3.54 MIL/uL — ABNORMAL LOW (ref 4.22–5.81)
RDW: 15.4 % (ref 11.5–15.5)
WBC: 8.3 10*3/uL (ref 4.0–10.5)
nRBC: 0 % (ref 0.0–0.2)

## 2020-06-19 IMAGING — DX DG CHEST 1V PORT
1 series · 1 of 1 positions shown · non-contrast
Comparison: [DATE].

CLINICAL DATA: Pneumonia.

EXAM:
PORTABLE CHEST 1 VIEW

[chest ap]
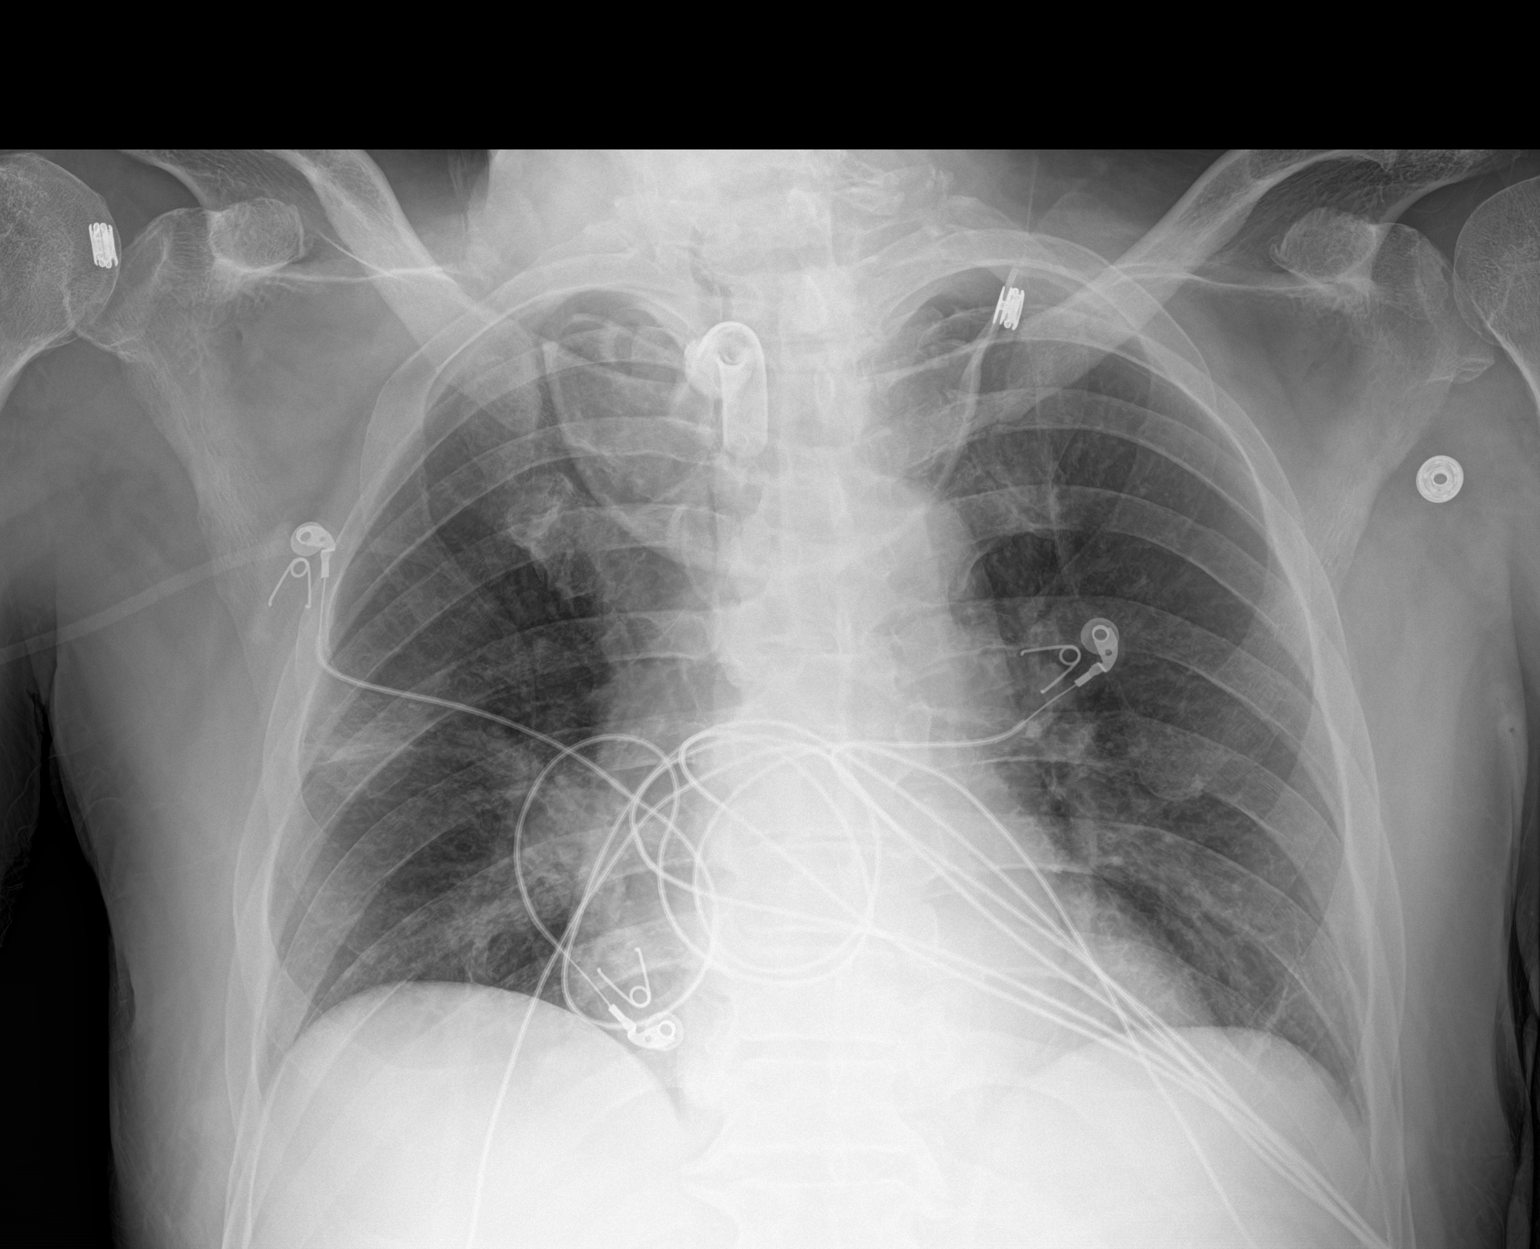

[1 of 1 positions shown; findings below may reference images not displayed]

FINDINGS: Tracheostomy tube in stable position. Stable mild cardiomegaly. No
pulmonary venous congestion. Right base atelectasis/infiltrate again
noted. Slight progression from prior exam. No pleural effusion or
pneumothorax. Prior cervical spine fusion.
IMPRESSION: 1. Tracheostomy tube in stable position. Stable mild cardiomegaly.
2. Right base atelectasis/infiltrate again noted. Slight progression
from prior exam.

## 2020-06-19 NOTE — Progress Notes (Signed)
Pulmonary Critical Care Medicine [place]   PULMONARY CRITICAL CARE SERVICE  PROGRESS NOTE  Date of Service: 06/19/2020  Ryan Day  ASN:[id number]  DOB: November 15, 1950   DOA: 06/07/2020  Referring Physician: Merton Border, MD  HPI: Ryan Day is a 69 y.o. male seen for follow up of Acute on Chronic Respiratory Failure.  Patient currently is on T collar has been on 28% FiO2 good saturations are noted secretions are fair to moderate  Medications: Reviewed on Rounds  Physical Exam:  Vitals: Temperature is 95.5 pulse 68 respiratory 18 blood pressure is 108/53 saturations 99%  Ventilator Settings on T collar with an FiO2 28%  . General: Comfortable at this time . Eyes: Grossly normal lids, irises & conjunctiva . ENT: grossly tongue is normal . Neck: no obvious mass . Cardiovascular: S1 S2 normal no gallop . Respiratory: No rhonchi no rales are noted at this time . Abdomen: soft . Skin: no rash seen on limited exam . Musculoskeletal: not rigid . Psychiatric:unable to assess . Neurologic: no seizure no involuntary movements         Lab Data:   Basic Metabolic Panel: Recent Labs  Lab 06/16/20 1518 06/18/20 1012  NA 153* 152*  K 3.8 4.0  CL 117* 113*  CO2 28 27  GLUCOSE 130* 122*  BUN 30* 30*  CREATININE 0.83 1.08  CALCIUM 10.3 10.5*  MG 2.3  --     ABG: No results for input(s): PHART, PCO2ART, PO2ART, HCO3, O2SAT in the last 168 hours.  Liver Function Tests: Recent Labs  Lab 06/16/20 1518  AST 19  ALT 29  ALKPHOS 145*  BILITOT 0.7  PROT 8.0  ALBUMIN 2.7*   No results for input(s): LIPASE, AMYLASE in the last 168 hours. No results for input(s): AMMONIA in the last 168 hours.  CBC: Recent Labs  Lab 06/16/20 1509  WBC 7.3  HGB 10.8*  HCT 35.4*  MCV 95.9  PLT 297    Cardiac Enzymes: No results for input(s): CKTOTAL, CKMB, CKMBINDEX, TROPONINI in the last 168 hours.  BNP (last 3 results) No results for input(s): BNP in the last  8760 hours.  ProBNP (last 3 results) No results for input(s): PROBNP in the last 8760 hours.  Radiological Exams: DG Chest Port 1 View  Result Date: 06/19/2020 CLINICAL DATA:  Pneumonia. EXAM: PORTABLE CHEST 1 VIEW COMPARISON:  06/09/2020. FINDINGS: Tracheostomy tube in stable position. Stable mild cardiomegaly. No pulmonary venous congestion. Right base atelectasis/infiltrate again noted. Slight progression from prior exam. No pleural effusion or pneumothorax. Prior cervical spine fusion. IMPRESSION: 1. Tracheostomy tube in stable position. Stable mild cardiomegaly. 2. Right base atelectasis/infiltrate again noted. Slight progression from prior exam. Electronically Signed   By: Marcello Moores  Register   On: 06/19/2020 07:19    Assessment/Plan Active Problems:   Acute on chronic respiratory failure with hypoxia (HCC)   Autonomic instability   Cardiac arrest (HCC)   Cervical spinal cord injury, sequela (HCC)   Acute renal injury due to hypovolemia ([place])   1. Acute on chronic respiratory failure with hypoxia we will continue T collar trials patient is at baseline using PMV 2. Cardiac arrest rhythm has been stable we will monitor 3. Cervical spinal cord injury no change 4. Acute renal failure resolved 5. Autonomic instability supportive care   I have personally seen and evaluated the patient, evaluated laboratory and imaging results, formulated the assessment and plan and placed orders. The Patient requires high complexity decision making with multiple systems involvement.  Rounds were done with the Respiratory Therapy Director and Staff therapists and discussed with nursing staff also.  Allyne Gee, MD [facility] Pulmonary Critical Care Medicine Sleep Medicine

## 2020-06-20 DIAGNOSIS — G909 Disorder of the autonomic nervous system, unspecified: Secondary | ICD-10-CM | POA: Diagnosis not present

## 2020-06-20 DIAGNOSIS — J9621 Acute and chronic respiratory failure with hypoxia: Secondary | ICD-10-CM | POA: Diagnosis not present

## 2020-06-20 DIAGNOSIS — N179 Acute kidney failure, unspecified: Secondary | ICD-10-CM | POA: Diagnosis not present

## 2020-06-20 DIAGNOSIS — I469 Cardiac arrest, cause unspecified: Secondary | ICD-10-CM | POA: Diagnosis not present

## 2020-06-20 LAB — BASIC METABOLIC PANEL
Anion gap: 9 (ref 5–15)
BUN: 27 mg/dL — ABNORMAL HIGH (ref 8–23)
CO2: 30 mmol/L (ref 22–32)
Calcium: 9.8 mg/dL (ref 8.9–10.3)
Chloride: 106 mmol/L (ref 98–111)
Creatinine, Ser: 0.79 mg/dL (ref 0.61–1.24)
GFR calc Af Amer: >60 mL/min (ref >60)
GFR calc non Af Amer: >60 mL/min (ref >60)
Glucose, Bld: 129 mg/dL — ABNORMAL HIGH (ref 70–99)
Potassium: 4.1 mmol/L (ref 3.5–5.1)
Sodium: 145 mmol/L (ref 135–145)

## 2020-06-20 LAB — MAGNESIUM: Magnesium: 2.2 mg/dL (ref 1.7–2.4)

## 2020-06-20 LAB — PHOSPHORUS: Phosphorus: 3.2 mg/dL (ref 2.5–4.6)

## 2020-06-20 NOTE — Progress Notes (Addendum)
Pulmonary Critical Care Medicine [place]   PULMONARY CRITICAL CARE SERVICE  PROGRESS NOTE  Date of Service: 06/20/2020  Ryan Day  YQM:[id number]  DOB: 04-07-1951   DOA: 06/07/2020  Referring Physician: Merton Border, MD  HPI: Ryan Day is a 69 y.o. male seen for follow up of Acute on Chronic Respiratory Failure.  Patient remains on 28% aerosol trach collar using PMV with no difficulty satting well at this time.  Medications: Reviewed on Rounds  Physical Exam:  Vitals: Pulse 66 respirations 28 BP 113/63 O2 sat 100% temp 96.8  Ventilator Settings 28% ATC  . General: Comfortable at this time . Eyes: Grossly normal lids, irises & conjunctiva . ENT: grossly tongue is normal . Neck: no obvious mass . Cardiovascular: S1 S2 normal no gallop . Respiratory: No rales or rhonchi noted . Abdomen: soft . Skin: no rash seen on limited exam . Musculoskeletal: not rigid . Psychiatric:unable to assess . Neurologic: no seizure no involuntary movements         Lab Data:   Basic Metabolic Panel: Recent Labs  Lab 06/16/20 1518 06/18/20 1012 06/19/20 0950 06/20/20 0834  NA 153* 152* 148* 145  K 3.8 4.0 3.5 4.1  CL 117* 113* 113* 106  CO2 28 27 26 30   GLUCOSE 130* 122* 145* 129*  BUN 30* 30* 28* 27*  CREATININE 0.83 1.08 0.83 0.79  CALCIUM 10.3 10.5* 10.2 9.8  MG 2.3  --   --  2.2  PHOS  --   --  3.4 3.2    ABG: No results for input(s): PHART, PCO2ART, PO2ART, HCO3, O2SAT in the last 168 hours.  Liver Function Tests: Recent Labs  Lab 06/16/20 1518 06/19/20 0950  AST 19  --   ALT 29  --   ALKPHOS 145*  --   BILITOT 0.7  --   PROT 8.0  --   ALBUMIN 2.7* 2.6*   No results for input(s): LIPASE, AMYLASE in the last 168 hours. No results for input(s): AMMONIA in the last 168 hours.  CBC: Recent Labs  Lab 06/16/20 1509 06/19/20 0950  WBC 7.3 8.3  HGB 10.8* 10.3*  HCT 35.4* 34.1*  MCV 95.9 96.3  PLT 297 226    Cardiac Enzymes: No results  for input(s): CKTOTAL, CKMB, CKMBINDEX, TROPONINI in the last 168 hours.  BNP (last 3 results) No results for input(s): BNP in the last 8760 hours.  ProBNP (last 3 results) No results for input(s): PROBNP in the last 8760 hours.  Radiological Exams: DG Chest Port 1 View  Result Date: 06/19/2020 CLINICAL DATA:  Pneumonia. EXAM: PORTABLE CHEST 1 VIEW COMPARISON:  06/09/2020. FINDINGS: Tracheostomy tube in stable position. Stable mild cardiomegaly. No pulmonary venous congestion. Right base atelectasis/infiltrate again noted. Slight progression from prior exam. No pleural effusion or pneumothorax. Prior cervical spine fusion. IMPRESSION: 1. Tracheostomy tube in stable position. Stable mild cardiomegaly. 2. Right base atelectasis/infiltrate again noted. Slight progression from prior exam. Electronically Signed   By: Marcello Moores  Register   On: 06/19/2020 07:19    Assessment/Plan Active Problems:   Acute on chronic respiratory failure with hypoxia (HCC)   Autonomic instability   Cardiac arrest (HCC)   Cervical spinal cord injury, sequela (HCC)   Acute renal injury due to hypovolemia ([place])   1. Acute on chronic respiratory failure with hypoxia we will continue T collar trials patient is at baseline using PMV 2. Cardiac arrest rhythm has been stable we will monitor 3. Cervical spinal cord injury no  change 4. Acute renal failure resolved 5. Autonomic instability supportive care   I have personally seen and evaluated the patient, evaluated laboratory and imaging results, formulated the assessment and plan and placed orders. The Patient requires high complexity decision making with multiple systems involvement.  Rounds were done with the Respiratory Therapy Director and Staff therapists and discussed with nursing staff also.  Allyne Gee, MD [facility] Pulmonary Critical Care Medicine Sleep Medicine

## 2020-06-21 DIAGNOSIS — J9621 Acute and chronic respiratory failure with hypoxia: Secondary | ICD-10-CM | POA: Diagnosis not present

## 2020-06-21 DIAGNOSIS — I469 Cardiac arrest, cause unspecified: Secondary | ICD-10-CM | POA: Diagnosis not present

## 2020-06-21 DIAGNOSIS — G909 Disorder of the autonomic nervous system, unspecified: Secondary | ICD-10-CM | POA: Diagnosis not present

## 2020-06-21 DIAGNOSIS — N179 Acute kidney failure, unspecified: Secondary | ICD-10-CM | POA: Diagnosis not present

## 2020-06-21 LAB — BASIC METABOLIC PANEL
Anion gap: 7 (ref 5–15)
BUN: 22 mg/dL (ref 8–23)
CO2: 29 mmol/L (ref 22–32)
Calcium: 10 mg/dL (ref 8.9–10.3)
Chloride: 106 mmol/L (ref 98–111)
Creatinine, Ser: 0.72 mg/dL (ref 0.61–1.24)
GFR calc Af Amer: >60 mL/min (ref >60)
GFR calc non Af Amer: >60 mL/min (ref >60)
Glucose, Bld: 118 mg/dL — ABNORMAL HIGH (ref 70–99)
Potassium: 3.7 mmol/L (ref 3.5–5.1)
Sodium: 142 mmol/L (ref 135–145)
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
## 2020-06-21 NOTE — Progress Notes (Addendum)
Pulmonary Critical Care Medicine [place]   PULMONARY CRITICAL CARE SERVICE  PROGRESS NOTE  Date of Service: 06/21/2020  Ryan Day  RCV:[id number]  DOB: 08-25-1951   DOA: 06/07/2020  Referring Physician: Merton Border, MD  HPI: Ryan Day is a 69 y.o. male seen for follow up of Acute on Chronic Respiratory Failure. Patient is now capped for 24 hours on room air satting well no distress.  Medications: Reviewed on Rounds  Physical Exam:  Vitals: Pulse 61 respirations 20 BP 120/64 O2 sat 100% temp 97.1  Ventilator Settings capped on room air  . General: Comfortable at this time . Eyes: Grossly normal lids, irises & conjunctiva . ENT: grossly tongue is normal . Neck: no obvious mass . Cardiovascular: S1 S2 normal no gallop . Respiratory: No rales or rhonchi noted . Abdomen: soft . Skin: no rash seen on limited exam . Musculoskeletal: not rigid . Psychiatric:unable to assess . Neurologic: no seizure no involuntary movements         Lab Data:   Basic Metabolic Panel: Recent Labs  Lab 06/16/20 1518 06/18/20 1012 06/19/20 0950 06/20/20 0834  NA 153* 152* 148* 145  K 3.8 4.0 3.5 4.1  CL 117* 113* 113* 106  CO2 28 27 26 30   GLUCOSE 130* 122* 145* 129*  BUN 30* 30* 28* 27*  CREATININE 0.83 1.08 0.83 0.79  CALCIUM 10.3 10.5* 10.2 9.8  MG 2.3  --   --  2.2  PHOS  --   --  3.4 3.2    ABG: No results for input(s): PHART, PCO2ART, PO2ART, HCO3, O2SAT in the last 168 hours.  Liver Function Tests: Recent Labs  Lab 06/16/20 1518 06/19/20 0950  AST 19  --   ALT 29  --   ALKPHOS 145*  --   BILITOT 0.7  --   PROT 8.0  --   ALBUMIN 2.7* 2.6*   No results for input(s): LIPASE, AMYLASE in the last 168 hours. No results for input(s): AMMONIA in the last 168 hours.  CBC: Recent Labs  Lab 06/16/20 1509 06/19/20 0950  WBC 7.3 8.3  HGB 10.8* 10.3*  HCT 35.4* 34.1*  MCV 95.9 96.3  PLT 297 226    Cardiac Enzymes: No results for input(s):  CKTOTAL, CKMB, CKMBINDEX, TROPONINI in the last 168 hours.  BNP (last 3 results) No results for input(s): BNP in the last 8760 hours.  ProBNP (last 3 results) No results for input(s): PROBNP in the last 8760 hours.  Radiological Exams: No results found.  Assessment/Plan Active Problems:   Acute on chronic respiratory failure with hypoxia (HCC)   Autonomic instability   Cardiac arrest (HCC)   Cervical spinal cord injury, sequela (HCC)   Acute renal injury due to hypovolemia ([place])   1. Acute on chronic respiratory failure with hypoxia patient has been capped for 24 hours remains on room air satting well continue supportive measures. 2. Cardiac arrest rhythm has been stable we will monitor 3. Cervical spinal cord injury no change 4. Acute renal failure resolved 5. Autonomic instability supportive care   I have personally seen and evaluated the patient, evaluated laboratory and imaging results, formulated the assessment and plan and placed orders. The Patient requires high complexity decision making with multiple systems involvement.  Rounds were done with the Respiratory Therapy Director and Staff therapists and discussed with nursing staff also.  Ryan Gee, MD [facility] Pulmonary Critical Care Medicine Sleep Medicine

## 2020-06-22 DIAGNOSIS — G909 Disorder of the autonomic nervous system, unspecified: Secondary | ICD-10-CM | POA: Diagnosis not present

## 2020-06-22 DIAGNOSIS — I469 Cardiac arrest, cause unspecified: Secondary | ICD-10-CM | POA: Diagnosis not present

## 2020-06-22 DIAGNOSIS — J9621 Acute and chronic respiratory failure with hypoxia: Secondary | ICD-10-CM | POA: Diagnosis not present

## 2020-06-22 DIAGNOSIS — N179 Acute kidney failure, unspecified: Secondary | ICD-10-CM | POA: Diagnosis not present

## 2020-06-22 NOTE — Progress Notes (Addendum)
Pulmonary Critical Care Medicine [place]   PULMONARY CRITICAL CARE SERVICE  PROGRESS NOTE  Date of Service: 06/22/2020  Ryan Day  FWY:[id number]  DOB: 11-29-1950   DOA: 06/07/2020  Referring Physician: Merton Border, MD  HPI: Ryan Day is a 68 y.o. male seen for follow up of Acute on Chronic Respiratory Failure.  Patient remains capped on room air satting well this time with no distress.  Medications: Reviewed on Rounds  Physical Exam:  Vitals: Pulse 57 respirations 18 BP 106/67 O2 sat 100% temp 96.6  Ventilator Settings capped on room air  . General: Comfortable at this time . Eyes: Grossly normal lids, irises & conjunctiva . ENT: grossly tongue is normal . Neck: no obvious mass . Cardiovascular: S1 S2 normal no gallop . Respiratory: No rales or rhonchi noted . Abdomen: soft . Skin: no rash seen on limited exam . Musculoskeletal: not rigid . Psychiatric:unable to assess . Neurologic: no seizure no involuntary movements         Lab Data:   Basic Metabolic Panel: Recent Labs  Lab 06/16/20 1518 06/18/20 1012 06/19/20 0950 06/20/20 0834 06/21/20 1109  NA 153* 152* 148* 145 142  K 3.8 4.0 3.5 4.1 3.7  CL 117* 113* 113* 106 106  CO2 28 27 26 30 29   GLUCOSE 130* 122* 145* 129* 118*  BUN 30* 30* 28* 27* 22  CREATININE 0.83 1.08 0.83 0.79 0.72  CALCIUM 10.3 10.5* 10.2 9.8 10.0  MG 2.3  --   --  2.2  --   PHOS  --   --  3.4 3.2  --     ABG: No results for input(s): PHART, PCO2ART, PO2ART, HCO3, O2SAT in the last 168 hours.  Liver Function Tests: Recent Labs  Lab 06/16/20 1518 06/19/20 0950  AST 19  --   ALT 29  --   ALKPHOS 145*  --   BILITOT 0.7  --   PROT 8.0  --   ALBUMIN 2.7* 2.6*   No results for input(s): LIPASE, AMYLASE in the last 168 hours. No results for input(s): AMMONIA in the last 168 hours.  CBC: Recent Labs  Lab 06/16/20 1509 06/19/20 0950  WBC 7.3 8.3  HGB 10.8* 10.3*  HCT 35.4* 34.1*  MCV 95.9 96.3   PLT 297 226    Cardiac Enzymes: No results for input(s): CKTOTAL, CKMB, CKMBINDEX, TROPONINI in the last 168 hours.  BNP (last 3 results) No results for input(s): BNP in the last 8760 hours.  ProBNP (last 3 results) No results for input(s): PROBNP in the last 8760 hours.  Radiological Exams: No results found.  Assessment/Plan Active Problems:   Acute on chronic respiratory failure with hypoxia (HCC)   Autonomic instability   Cardiac arrest (HCC)   Cervical spinal cord injury, sequela (HCC)   Acute renal injury due to hypovolemia ([place])   1. Acute on chronic respiratory failure with hypoxia patient has been capped for 48 hours remains on room air satting well continue supportive measures. 2. Cardiac arrest rhythm has been stable we will monitor 3. Cervical spinal cord injury no change 4. Acute renal failure resolved 5. Autonomic instability supportive care   I have personally seen and evaluated the patient, evaluated laboratory and imaging results, formulated the assessment and plan and placed orders. The Patient requires high complexity decision making with multiple systems involvement.  Rounds were done with the Respiratory Therapy Director and Staff therapists and discussed with nursing staff also.  Allyne Gee, MD  [facility] Pulmonary Critical Care Medicine Sleep Medicine

## 2020-06-23 ENCOUNTER — Encounter: Payer: Self-pay | Admitting: Internal Medicine

## 2020-06-23 ENCOUNTER — Inpatient Hospital Stay (HOSPITAL_COMMUNITY)
Admission: RE | Admit: 2020-06-23 | Discharge: 2020-07-20 | DRG: 052 | Disposition: A | Discharge status: Home / Self Care | Payer: BC Managed Care – PPO | Source: Other Acute Inpatient Hospital | Attending: Physical Medicine and Rehabilitation | Admitting: Physical Medicine and Rehabilitation

## 2020-06-23 ENCOUNTER — Other Ambulatory Visit: Payer: Self-pay

## 2020-06-23 ENCOUNTER — Encounter (HOSPITAL_COMMUNITY): Payer: Self-pay | Admitting: Physical Medicine and Rehabilitation

## 2020-06-23 DIAGNOSIS — N319 Neuromuscular dysfunction of bladder, unspecified: Secondary | ICD-10-CM

## 2020-06-23 DIAGNOSIS — L89152 Pressure ulcer of sacral region, stage 2: Secondary | ICD-10-CM | POA: Diagnosis present

## 2020-06-23 DIAGNOSIS — G903 Multi-system degeneration of the autonomic nervous system: Secondary | ICD-10-CM

## 2020-06-23 DIAGNOSIS — N179 Acute kidney failure, unspecified: Secondary | ICD-10-CM | POA: Diagnosis not present

## 2020-06-23 DIAGNOSIS — J9621 Acute and chronic respiratory failure with hypoxia: Secondary | ICD-10-CM | POA: Diagnosis not present

## 2020-06-23 DIAGNOSIS — R42 Dizziness and giddiness: Secondary | ICD-10-CM | POA: Diagnosis not present

## 2020-06-23 DIAGNOSIS — K592 Neurogenic bowel, not elsewhere classified: Secondary | ICD-10-CM | POA: Diagnosis present

## 2020-06-23 DIAGNOSIS — D638 Anemia in other chronic diseases classified elsewhere: Secondary | ICD-10-CM | POA: Diagnosis present

## 2020-06-23 DIAGNOSIS — D649 Anemia, unspecified: Secondary | ICD-10-CM

## 2020-06-23 DIAGNOSIS — E861 Hypovolemia: Secondary | ICD-10-CM | POA: Diagnosis not present

## 2020-06-23 DIAGNOSIS — L89150 Pressure ulcer of sacral region, unstageable: Secondary | ICD-10-CM | POA: Diagnosis present

## 2020-06-23 DIAGNOSIS — I469 Cardiac arrest, cause unspecified: Secondary | ICD-10-CM | POA: Diagnosis not present

## 2020-06-23 DIAGNOSIS — Z931 Gastrostomy status: Secondary | ICD-10-CM

## 2020-06-23 DIAGNOSIS — L89322 Pressure ulcer of left buttock, stage 2: Secondary | ICD-10-CM

## 2020-06-23 DIAGNOSIS — Z8674 Personal history of sudden cardiac arrest: Secondary | ICD-10-CM

## 2020-06-23 DIAGNOSIS — E876 Hypokalemia: Secondary | ICD-10-CM | POA: Diagnosis not present

## 2020-06-23 DIAGNOSIS — Z66 Do not resuscitate: Secondary | ICD-10-CM | POA: Diagnosis present

## 2020-06-23 DIAGNOSIS — Z7189 Other specified counseling: Secondary | ICD-10-CM | POA: Diagnosis not present

## 2020-06-23 DIAGNOSIS — E871 Hypo-osmolality and hyponatremia: Secondary | ICD-10-CM | POA: Diagnosis not present

## 2020-06-23 DIAGNOSIS — J988 Other specified respiratory disorders: Secondary | ICD-10-CM

## 2020-06-23 DIAGNOSIS — L89313 Pressure ulcer of right buttock, stage 3: Secondary | ICD-10-CM | POA: Diagnosis present

## 2020-06-23 DIAGNOSIS — Z79899 Other long term (current) drug therapy: Secondary | ICD-10-CM

## 2020-06-23 DIAGNOSIS — E860 Dehydration: Secondary | ICD-10-CM | POA: Diagnosis not present

## 2020-06-23 DIAGNOSIS — L89323 Pressure ulcer of left buttock, stage 3: Secondary | ICD-10-CM | POA: Diagnosis present

## 2020-06-23 DIAGNOSIS — G909 Disorder of the autonomic nervous system, unspecified: Secondary | ICD-10-CM | POA: Diagnosis not present

## 2020-06-23 DIAGNOSIS — E870 Hyperosmolality and hypernatremia: Secondary | ICD-10-CM | POA: Diagnosis present

## 2020-06-23 DIAGNOSIS — K117 Disturbances of salivary secretion: Secondary | ICD-10-CM

## 2020-06-23 DIAGNOSIS — I951 Orthostatic hypotension: Secondary | ICD-10-CM | POA: Diagnosis not present

## 2020-06-23 DIAGNOSIS — R490 Dysphonia: Secondary | ICD-10-CM | POA: Diagnosis present

## 2020-06-23 DIAGNOSIS — Z8249 Family history of ischemic heart disease and other diseases of the circulatory system: Secondary | ICD-10-CM

## 2020-06-23 DIAGNOSIS — Z930 Tracheostomy status: Secondary | ICD-10-CM

## 2020-06-23 DIAGNOSIS — Z91013 Allergy to seafood: Secondary | ICD-10-CM

## 2020-06-23 DIAGNOSIS — Z981 Arthrodesis status: Secondary | ICD-10-CM | POA: Diagnosis not present

## 2020-06-23 DIAGNOSIS — Z7982 Long term (current) use of aspirin: Secondary | ICD-10-CM

## 2020-06-23 DIAGNOSIS — Z515 Encounter for palliative care: Secondary | ICD-10-CM | POA: Diagnosis not present

## 2020-06-23 DIAGNOSIS — G4700 Insomnia, unspecified: Secondary | ICD-10-CM | POA: Diagnosis present

## 2020-06-23 DIAGNOSIS — S14109S Unspecified injury at unspecified level of cervical spinal cord, sequela: Secondary | ICD-10-CM | POA: Diagnosis not present

## 2020-06-23 DIAGNOSIS — R058 Other specified cough: Secondary | ICD-10-CM

## 2020-06-23 DIAGNOSIS — G8250 Quadriplegia, unspecified: Secondary | ICD-10-CM

## 2020-06-23 DIAGNOSIS — R252 Cramp and spasm: Secondary | ICD-10-CM | POA: Diagnosis not present

## 2020-06-23 DIAGNOSIS — L89892 Pressure ulcer of other site, stage 2: Secondary | ICD-10-CM | POA: Diagnosis not present

## 2020-06-23 DIAGNOSIS — I119 Hypertensive heart disease without heart failure: Secondary | ICD-10-CM | POA: Diagnosis present

## 2020-06-23 DIAGNOSIS — R739 Hyperglycemia, unspecified: Secondary | ICD-10-CM | POA: Diagnosis present

## 2020-06-23 DIAGNOSIS — Z09 Encounter for follow-up examination after completed treatment for conditions other than malignant neoplasm: Secondary | ICD-10-CM

## 2020-06-23 DIAGNOSIS — S14153S Other incomplete lesion at C3 level of cervical spinal cord, sequela: Secondary | ICD-10-CM | POA: Diagnosis present

## 2020-06-23 DIAGNOSIS — L8990 Pressure ulcer of unspecified site, unspecified stage: Secondary | ICD-10-CM | POA: Insufficient documentation

## 2020-06-23 DIAGNOSIS — E785 Hyperlipidemia, unspecified: Secondary | ICD-10-CM | POA: Diagnosis present

## 2020-06-23 DIAGNOSIS — W1839XS Other fall on same level, sequela: Secondary | ICD-10-CM

## 2020-06-23 LAB — CBC
HCT: 33.4 % — ABNORMAL LOW (ref 39.0–52.0)
Hemoglobin: 10.8 g/dL — ABNORMAL LOW (ref 13.0–17.0)
MCH: 29.7 pg (ref 26.0–34.0)
MCHC: 32.3 g/dL (ref 30.0–36.0)
MCV: 91.8 fL (ref 80.0–100.0)
Platelets: 168 10*3/uL (ref 150–400)
RBC: 3.64 MIL/uL — ABNORMAL LOW (ref 4.22–5.81)
RDW: 15 % (ref 11.5–15.5)
WBC: 7 10*3/uL (ref 4.0–10.5)
nRBC: 0 % (ref 0.0–0.2)

## 2020-06-23 LAB — GLUCOSE, CAPILLARY: Glucose-Capillary: 119 mg/dL — ABNORMAL HIGH (ref 70–99)

## 2020-06-23 LAB — BASIC METABOLIC PANEL
Anion gap: 11 (ref 5–15)
BUN: 22 mg/dL (ref 8–23)
CO2: 29 mmol/L (ref 22–32)
Calcium: 10.1 mg/dL (ref 8.9–10.3)
Chloride: 106 mmol/L (ref 98–111)
Creatinine, Ser: 0.62 mg/dL (ref 0.61–1.24)
GFR calc Af Amer: >60 mL/min (ref >60)
GFR calc non Af Amer: >60 mL/min (ref >60)
Glucose, Bld: 100 mg/dL — ABNORMAL HIGH (ref 70–99)
Potassium: 3.6 mmol/L (ref 3.5–5.1)
Sodium: 146 mmol/L — ABNORMAL HIGH (ref 135–145)

## 2020-06-23 LAB — SARS CORONAVIRUS 2 (TAT 6-24 HRS): SARS Coronavirus 2: NEGATIVE

## 2020-06-23 LAB — MAGNESIUM: Magnesium: 2.2 mg/dL (ref 1.7–2.4)

## 2020-06-23 LAB — PHOSPHORUS: Phosphorus: 2.9 mg/dL (ref 2.5–4.6)

## 2020-06-23 MED ORDER — PROSOURCE TF PO LIQD
45.0000 mL | Freq: Two times a day (BID) | ORAL | Status: DC
  Administered 2020-06-23 – 2020-06-24 (×2): 45 mL
  Filled 2020-06-23 (×2): qty 45

## 2020-06-23 MED ORDER — POLYETHYLENE GLYCOL 3350 17 G PO PACK: 17.0000 g | PACK | Freq: Every day | ORAL | Status: DC | PRN

## 2020-06-23 MED ORDER — PROCHLORPERAZINE 25 MG RE SUPP: 12.5000 mg | Freq: Four times a day (QID) | RECTAL | Status: DC | PRN

## 2020-06-23 MED ORDER — PROCHLORPERAZINE EDISYLATE 10 MG/2ML IJ SOLN: 5.0000 mg | Freq: Four times a day (QID) | INTRAMUSCULAR | Status: DC | PRN

## 2020-06-23 MED ORDER — CHLORHEXIDINE GLUCONATE 0.12 % MT SOLN
15.0000 mL | Freq: Two times a day (BID) | OROMUCOSAL | Status: DC
  Administered 2020-06-23 – 2020-07-20 (×53): 15 mL via OROMUCOSAL
  Filled 2020-06-23 (×54): qty 15

## 2020-06-23 MED ORDER — FLUOXETINE HCL 20 MG/5ML PO SOLN
40.0000 mg | Freq: Every day | ORAL | Status: DC
  Administered 2020-06-24 – 2020-07-20 (×27): 40 mg
  Filled 2020-06-23 (×28): qty 10

## 2020-06-23 MED ORDER — ACETAMINOPHEN 325 MG PO TABS: 325.0000 mg | ORAL_TABLET | ORAL | Status: DC | PRN

## 2020-06-23 MED ORDER — GUAIFENESIN 200 MG PO TABS
400.0000 mg | ORAL_TABLET | Freq: Four times a day (QID) | ORAL | Status: DC
  Administered 2020-06-23 – 2020-07-20 (×107): 400 mg
  Filled 2020-06-23 (×110): qty 2

## 2020-06-23 MED ORDER — ALUM & MAG HYDROXIDE-SIMETH 200-200-20 MG/5ML PO SUSP: 30.0000 mL | ORAL | Status: DC | PRN

## 2020-06-23 MED ORDER — SACCHAROMYCES BOULARDII 250 MG PO CAPS
250.0000 mg | ORAL_CAPSULE | Freq: Two times a day (BID) | ORAL | Status: DC
  Administered 2020-06-23 – 2020-07-20 (×54): 250 mg
  Filled 2020-06-23 (×55): qty 1

## 2020-06-23 MED ORDER — FAMOTIDINE 20 MG PO TABS
20.0000 mg | ORAL_TABLET | Freq: Every day | ORAL | Status: DC
  Administered 2020-06-24 – 2020-07-20 (×27): 20 mg
  Filled 2020-06-23 (×28): qty 1

## 2020-06-23 MED ORDER — VITAL 1.5 CAL PO LIQD
1000.0000 mL | ORAL | Status: DC
  Administered 2020-06-23 – 2020-06-24 (×2): 1000 mL
  Filled 2020-06-23 (×2): qty 1000

## 2020-06-23 MED ORDER — FLEET ENEMA 7-19 GM/118ML RE ENEM: 1.0000 | ENEMA | Freq: Once | RECTAL | Status: DC | PRN

## 2020-06-23 MED ORDER — ENOXAPARIN SODIUM 40 MG/0.4ML ~~LOC~~ SOLN
40.0000 mg | SUBCUTANEOUS | Status: DC
  Administered 2020-06-23 – 2020-07-19 (×27): 40 mg via SUBCUTANEOUS
  Filled 2020-06-23 (×29): qty 0.4

## 2020-06-23 MED ORDER — FREE WATER
200.0000 mL | Freq: Three times a day (TID) | Status: DC
  Administered 2020-06-23 – 2020-06-24 (×3): 200 mL

## 2020-06-23 MED ORDER — GUAIFENESIN-DM 100-10 MG/5ML PO SYRP: 5.0000 mL | ORAL_SOLUTION | Freq: Four times a day (QID) | ORAL | Status: DC | PRN

## 2020-06-23 MED ORDER — ADULT MULTIVITAMIN LIQUID CH
15.0000 mL | Freq: Every day | ORAL | Status: DC
  Administered 2020-06-24 – 2020-07-11 (×18): 15 mL
  Filled 2020-06-23 (×18): qty 15

## 2020-06-23 MED ORDER — PROCHLORPERAZINE MALEATE 5 MG PO TABS: 5.0000 mg | ORAL_TABLET | Freq: Four times a day (QID) | ORAL | Status: DC | PRN

## 2020-06-23 MED ORDER — ZINC SULFATE 220 (50 ZN) MG PO CAPS
220.0000 mg | ORAL_CAPSULE | Freq: Every day | ORAL | Status: DC
  Administered 2020-06-23 – 2020-07-20 (×28): 220 mg
  Filled 2020-06-23 (×28): qty 1

## 2020-06-23 MED ORDER — TRAZODONE HCL 50 MG PO TABS
50.0000 mg | ORAL_TABLET | Freq: Every day | ORAL | Status: DC
  Administered 2020-06-23 – 2020-07-19 (×27): 50 mg
  Filled 2020-06-23 (×27): qty 1

## 2020-06-23 MED ORDER — DIPHENHYDRAMINE HCL 12.5 MG/5ML PO ELIX: 12.5000 mg | ORAL_SOLUTION | Freq: Four times a day (QID) | ORAL | Status: DC | PRN

## 2020-06-23 MED ORDER — JUVEN PO PACK
1.0000 | PACK | Freq: Two times a day (BID) | ORAL | Status: DC
Start: 2020-06-24 — End: 2020-07-20
  Administered 2020-06-24 – 2020-07-19 (×51): 1
  Filled 2020-06-23 (×47): qty 1

## 2020-06-23 MED ORDER — SENNOSIDES 8.8 MG/5ML PO SYRP
10.0000 mL | ORAL_SOLUTION | Freq: Every day | ORAL | Status: DC
  Administered 2020-06-24 – 2020-06-28 (×5): 10 mL
  Filled 2020-06-23 (×6): qty 10

## 2020-06-23 MED ORDER — BISACODYL 10 MG RE SUPP
10.0000 mg | Freq: Every day | RECTAL | Status: DC | PRN
  Administered 2020-06-23 – 2020-06-27 (×3): 10 mg via RECTAL
  Filled 2020-06-23 (×2): qty 1

## 2020-06-23 MED ORDER — ASCORBIC ACID 500 MG PO TABS
500.0000 mg | ORAL_TABLET | Freq: Two times a day (BID) | ORAL | Status: DC
  Administered 2020-06-23 – 2020-07-20 (×54): 500 mg
  Filled 2020-06-23 (×54): qty 1

## 2020-06-23 MED ORDER — LIDOCAINE 5 % EX PTCH
1.0000 | MEDICATED_PATCH | CUTANEOUS | Status: DC
  Administered 2020-06-24 – 2020-07-20 (×27): 1 via TRANSDERMAL
  Filled 2020-06-23 (×27): qty 1

## 2020-06-23 MED ORDER — SCOPOLAMINE 1 MG/3DAYS TD PT72
1.0000 | MEDICATED_PATCH | TRANSDERMAL | Status: DC
  Administered 2020-06-23 – 2020-07-19 (×11): 1.5 mg via TRANSDERMAL
  Filled 2020-06-23 (×14): qty 1

## 2020-06-23 MED ORDER — INSULIN ASPART 100 UNIT/ML ~~LOC~~ SOLN
0.0000 [IU] | SUBCUTANEOUS | Status: DC
  Administered 2020-06-24 – 2020-06-25 (×11): 1 [IU] via SUBCUTANEOUS
  Administered 2020-06-26: 2 [IU] via SUBCUTANEOUS
  Administered 2020-06-26 (×2): 1 [IU] via SUBCUTANEOUS
  Administered 2020-06-26: 2 [IU] via SUBCUTANEOUS
  Administered 2020-06-26: 1 [IU] via SUBCUTANEOUS
  Administered 2020-06-26: 2 [IU] via SUBCUTANEOUS
  Administered 2020-06-27: 1 [IU] via SUBCUTANEOUS
  Administered 2020-06-27: 2 [IU] via SUBCUTANEOUS
  Administered 2020-06-27 (×2): 1 [IU] via SUBCUTANEOUS
  Administered 2020-06-27: 2 [IU] via SUBCUTANEOUS
  Administered 2020-06-28 (×4): 1 [IU] via SUBCUTANEOUS
  Administered 2020-06-28: 2 [IU] via SUBCUTANEOUS
  Administered 2020-06-29 (×2): 1 [IU] via SUBCUTANEOUS
  Administered 2020-06-29: 2 [IU] via SUBCUTANEOUS
  Administered 2020-06-30: 1 [IU] via SUBCUTANEOUS
  Administered 2020-06-30: 2 [IU] via SUBCUTANEOUS
  Administered 2020-06-30 (×2): 1 [IU] via SUBCUTANEOUS
  Administered 2020-07-01: 2 [IU] via SUBCUTANEOUS
  Administered 2020-07-01 (×2): 1 [IU] via SUBCUTANEOUS
  Administered 2020-07-02 (×2): 2 [IU] via SUBCUTANEOUS
  Administered 2020-07-02: 1 [IU] via SUBCUTANEOUS
  Administered 2020-07-03: 3 [IU] via SUBCUTANEOUS
  Administered 2020-07-03 (×2): 2 [IU] via SUBCUTANEOUS
  Administered 2020-07-03: 3 [IU] via SUBCUTANEOUS
  Administered 2020-07-04: 2 [IU] via SUBCUTANEOUS
  Administered 2020-07-04: 1 [IU] via SUBCUTANEOUS
  Administered 2020-07-04: 2 [IU] via SUBCUTANEOUS
  Administered 2020-07-04: 3 [IU] via SUBCUTANEOUS
  Administered 2020-07-05: 1 [IU] via SUBCUTANEOUS
  Administered 2020-07-05 (×2): 3 [IU] via SUBCUTANEOUS
  Administered 2020-07-06: 2 [IU] via SUBCUTANEOUS

## 2020-06-23 MED ORDER — ORAL CARE MOUTH RINSE
15.0000 mL | Freq: Two times a day (BID) | OROMUCOSAL | Status: DC
  Administered 2020-06-24 – 2020-07-19 (×50): 15 mL via OROMUCOSAL

## 2020-06-23 MED ORDER — TRAZODONE HCL 50 MG PO TABS
25.0000 mg | ORAL_TABLET | Freq: Every evening | ORAL | Status: DC | PRN
Start: 2020-06-23 — End: 2020-06-24

## 2020-06-23 NOTE — Progress Notes (Signed)
Inpatient Rehabilitation  Patient information reviewed and entered into eRehab system by Bexlee Bergdoll M. Kwesi Sangha, M.A., CCC/SLP, PPS Coordinator.  Information including medical coding, functional ability and quality indicators will be reviewed and updated through discharge.    

## 2020-06-23 NOTE — Progress Notes (Signed)
Orthopedic Tech Progress Note Patient Details:  Keshan Reha 1951-05-28 [id number] Called in order to [facility] for [place] and [employer]AFO [employer]OT Patient ID: Neils [place], male   DOB: 1951-02-23, 69 y.o.   MRN: [id number]   [name] 06/23/2020, 6:41 PM

## 2020-06-23 NOTE — Progress Notes (Addendum)
Pulmonary Critical Care Medicine [place]   PULMONARY CRITICAL CARE SERVICE  PROGRESS NOTE  Date of Service: 06/23/2020  Brycin Kille  PXT:[id number]  DOB: [birth year]   DOA: 06/07/2020  Referring Physician: Merton Border, MD  HPI: German Manke is a 69 y.o. male seen for follow up of Acute on Chronic Respiratory Failure.  Patient remains capped on room air at this time satting well no fever distress.  Medications: Reviewed on Rounds  Physical Exam:  Vitals: Pulse 57 respirations 18 BP 149/78 O2 sat 100% temp 97.0  Ventilator Settings capped on room air  . General: Comfortable at this time . Eyes: Grossly normal lids, irises & conjunctiva . ENT: grossly tongue is normal . Neck: no obvious mass . Cardiovascular: S1 S2 normal no gallop . Respiratory: Coarse breath sounds . Abdomen: soft . Skin: no rash seen on limited exam . Musculoskeletal: not rigid . Psychiatric:unable to assess . Neurologic: no seizure no involuntary movements         Lab Data:   Basic Metabolic Panel: Recent Labs  Lab 06/16/20 1518 06/16/20 1518 06/18/20 1012 06/19/20 0950 06/20/20 0834 06/21/20 1109 06/23/20 0725  NA 153*   < > 152* 148* 145 142 146*  K 3.8   < > 4.0 3.5 4.1 3.7 3.6  CL 117*   < > 113* 113* 106 106 106  CO2 28   < > 27 26 30 29 29   GLUCOSE 130*   < > 122* 145* 129* 118* 100*  BUN 30*   < > 30* 28* 27* 22 22  CREATININE 0.83   < > 1.08 0.83 0.79 0.72 0.62  CALCIUM 10.3   < > 10.5* 10.2 9.8 10.0 10.1  MG 2.3  --   --   --  2.2  --  2.2  PHOS  --   --   --  3.4 3.2  --  2.9   < > = values in this interval not displayed.    ABG: No results for input(s): PHART, PCO2ART, PO2ART, HCO3, O2SAT in the last 168 hours.  Liver Function Tests: Recent Labs  Lab 06/16/20 1518 06/19/20 0950  AST 19  --   ALT 29  --   ALKPHOS 145*  --   BILITOT 0.7  --   PROT 8.0  --   ALBUMIN 2.7* 2.6*   No results for input(s): LIPASE, AMYLASE in the last 168 hours. No  results for input(s): AMMONIA in the last 168 hours.  CBC: Recent Labs  Lab 06/16/20 1509 06/19/20 0950 06/23/20 0725  WBC 7.3 8.3 7.0  HGB 10.8* 10.3* 10.8*  HCT 35.4* 34.1* 33.4*  MCV 95.9 96.3 91.8  PLT 297 226 168    Cardiac Enzymes: No results for input(s): CKTOTAL, CKMB, CKMBINDEX, TROPONINI in the last 168 hours.  BNP (last 3 results) No results for input(s): BNP in the last 8760 hours.  ProBNP (last 3 results) No results for input(s): PROBNP in the last 8760 hours.  Radiological Exams: No results found.  Assessment/Plan Active Problems:   Acute on chronic respiratory failure with hypoxia (HCC)   Autonomic instability   Cardiac arrest (HCC)   Cervical spinal cord injury, sequela (HCC)   Acute renal injury due to hypovolemia ([place])    1. Acute on chronic respiratory failure with hypoxiapatient continues to be capped on room air at this time we will continue supportive measures. 2. Cardiac arrest rhythm has been stable we will monitor 3. Cervical spinal cord injury  no change 4. Acute renal failure resolved 5. Autonomic instability supportive care   I have personally seen and evaluated the patient, evaluated laboratory and imaging results, formulated the assessment and plan and placed orders. The Patient requires high complexity decision making with multiple systems involvement.  Rounds were done with the Respiratory Therapy Director and Staff therapists and discussed with nursing staff also.  Allyne Gee, MD [facility] Pulmonary Critical Care Medicine Sleep Medicine

## 2020-06-23 NOTE — H&P (Signed)
Physical Medicine and Rehabilitation Admission H&P    CC: Functional deficits due to SCI/quadriplegia    HPI: Ryan Day is a 69 year old male with history of HTN otherwise in relatively good health who was admitted to [facility] on 04/26/2020 after fall, striking his head and face with subsequent qualdraplegia. He was found to have C5 vertebral body extension type teardrop fracture, C3-C6 cord compression with edema and was taken to OR for C3-C6 laminectomy with fusion and repair of small dural tear X 2 by Dr. Macario Carls on the same day. Post op course significant for spinal shock with hypotension and bradycardia, post procedural PTX,  C-diff colitis, significant secretions requiring multiple bronchoscopies due to mucous plugging and ultimately required PEG/Trach on 05/04/20. Hospital course further complicated by respiratory distress with PEA and and ROSC achieved after 8 minutes. Aspiration PNA and intermittent fevers treated, AKI/acute on chronic anemia/abnormal LFTs resolving and he was tolerating PMSV trials.   He was discharged to [facility] on 06/07/20 for management of acute on chronic respiratory failure and for therapy. He has had intermittent issues with pre-renal azotemia and hypernatremia--Na-149 today and started on IV dextrose. Secretions have improved with addition of scopolamine patch, he was downsized to CFS#6  and he is tolerating button plugging. He was found to have E-coli UTI at admission and treated with 7 day course of IV rocephin. CXR done 09/17 due to ongoing secretions and showed right infrahilar PNA which was treated with course of doxycycline. He was made NPO as FEES revealed penetration of nectar to vallecula with difficulty clearing residue and eventual aspiration. Multiple wound on sacrum have evolved and being managed with local measures.  Per RT- no desaturations or mucous plugging during his stay but pulmonary recommends evaluation on outpatient basis prior to decannulation due to  weakness.  Therapy has been ongoing with patient showing improvement in activity tolerance and therapy team was recommending intensive rehab program.  He has a supportive family who plan on providing assistance after discharge. He was felt to be a good CIR candidate. Please see preadmission assessment from earlier today as well.   Review of Systems  Constitutional: Negative for chills and fever.  HENT: Negative for ear pain, hearing loss and tinnitus.   Eyes: Negative for blurred vision and double vision.  Respiratory: Positive for sputum production. Negative for cough, hemoptysis and shortness of breath.   Cardiovascular: Negative for chest pain, palpitations and leg swelling.  Gastrointestinal: Negative for abdominal pain, constipation, heartburn and nausea.  Genitourinary: Negative for dysuria and urgency.  Musculoskeletal: Negative for back pain, joint pain, myalgias and neck pain.  Skin: Negative for itching and rash.  Neurological: Positive for tingling (bilateral hands), sensory change, speech change, focal weakness and weakness. Negative for dizziness and headaches.  Psychiatric/Behavioral: The patient has insomnia. The patient is not nervous/anxious.   All other systems reviewed and are negative.    Past Medical History:  Diagnosis Date  . Acute on chronic respiratory failure with hypoxia ([place])   . Acute renal injury due to hypovolemia ([place])   . Autonomic instability   . Cardiac arrest ([place])   . Cervical spinal cord injury, sequela ([place])   . Elevated alkaline phosphatase level   . 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 Hyperlipidemia   . Hypertension     Past Surgical History:  Procedure Laterality Date  . COLONOSCOPY WITH PROPOFOL N/A 12/05/2017   Procedure: COLONOSCOPY WITH PROPOFOL;  Surgeon: Lin Landsman, MD;  Location: [facility] ENDOSCOPY;  Service: Gastroenterology;  Laterality: N/A;  . ESOPHAGOGASTRODUODENOSCOPY  12/05/2017   Procedure:  ESOPHAGOGASTRODUODENOSCOPY (EGD);  Surgeon: Lin Landsman, MD;  Location: [facility] ENDOSCOPY;  Service: Gastroenterology;;    Family History  Problem Relation Age of Onset  . Cancer Father        lung  . Cancer Brother        throat  . Heart disease Brother 40  . Liver cancer Brother   . Alcohol abuse Brother     Social History:  Retired. Independent without AD. Wife works--CNA and has 2 daughtrs who are nurses (one at [employer] floor). He reports that he has never smoked. He has never used smokeless tobacco. He reports current alcohol use--couple of drinks per week.  He reports that he does not use drugs.    Allergies: No Known Allergies    Medications Prior to Admission  Medication Sig Dispense Refill  . aspirin 81 MG chewable tablet Chew 81 mg by mouth daily.    . hydrochlorothiazide (HYDRODIURIL) 25 MG tablet Take 1 tablet (25 mg total) by mouth daily. 90 tablet 1  . simvastatin (ZOCOR) 40 MG tablet Take 1 tablet (40 mg total) by mouth daily. 90 tablet 1    Drug Regimen Review  Drug regimen was reviewed and remains appropriate with no significant issues identified  Home: Home Living Living Arrangements: Spouse/significant other Available Help at Discharge: Family, Available 24 hours/day (Wife will stop working when patient is discharged from CIR) Type of Home: Mobile home (Double wide mobile home) Home Access: Stairs to enter Technical brewer of Steps: 5 Entrance Stairs-Rails: Right, Left, Can reach both Home Layout: One level Bathroom Shower/Tub: Tub/shower unit, Industrial/product designer: Yes  Lives With: Spouse   Functional History: Prior Function Level of Independence: Independent  Functional Status:  Mobility: Dependent for bed mobility. Able to sit at edge of bed with max assist.      ADL: Working on [place].   Cognition:       Height 6\' 5"  (1.956 m), weight 105.2 kg. Physical Exam Vitals and  nursing note reviewed.  Constitutional:      Appearance: Normal appearance.     Comments: Sitting supported in bed--NAD. Trach button plugged and no issues with secretions during exam. .   HENT:     Head: Normocephalic and atraumatic.     Right Ear: External ear normal.     Left Ear: External ear normal.     Nose: Nose normal.  Eyes:     General:        Right eye: No discharge.        Left eye: No discharge.     Extraocular Movements: Extraocular movements intact.  Neck:     Comments: +Capped trach Cardiovascular:     Rate and Rhythm: Normal rate and regular rhythm.  Pulmonary:     Effort: Pulmonary effort is normal.     Breath sounds: Normal breath sounds.  Abdominal:     General: Abdomen is flat. There is distension.     Comments: +PEG  Musculoskeletal:     Comments: No edema or tenderness in extremities  Skin:    General: Skin is warm and dry.     Comments: Sacral ulcer not examined  Neurological:     Mental Status: He is alert and oriented to person, place, and time.     Comments: Dysphonia (wife reports close to baseline).  Motor: B/l UE shoulder abduction  1+/5, distally 0/5 with ?trace flicker left wrist flexor B/l LE: 0/5 proximal to distal Sensation diminished to light touch in all extremities  Psychiatric:        Mood and Affect: Mood normal.        Behavior: Behavior normal.    Results for orders placed or performed during the hospital encounter of 06/07/20 (from the past 48 hour(s))  CBC     Status: Abnormal   Collection Time: 06/23/20  7:25 AM  Result Value Ref Range   WBC 7.0 4.0 - 10.5 K/uL   RBC 3.64 (L) 4.22 - 5.81 MIL/uL   Hemoglobin 10.8 (L) 13.0 - 17.0 g/dL   HCT 33.4 (L) 39 - 52 %   MCV 91.8 80.0 - 100.0 fL   MCH 29.7 26.0 - 34.0 pg   MCHC 32.3 30.0 - 36.0 g/dL   RDW 15.0 11.5 - 15.5 %   Platelets 168 150 - 400 K/uL   nRBC 0.0 0.0 - 0.2 %    Comment: Performed at [facility], [place] [address] [place], [place] [address]  Basic metabolic  panel     Status: Abnormal   Collection Time: 06/23/20  7:25 AM  Result Value Ref Range   Sodium 146 (H) 135 - 145 mmol/L   Potassium 3.6 3.5 - 5.1 mmol/L   Chloride 106 98 - 111 mmol/L   CO2 29 22 - 32 mmol/L   Glucose, Bld 100 (H) 70 - 99 mg/dL    Comment: Glucose reference range applies only to samples taken after fasting for at least 8 hours.   BUN 22 8 - 23 mg/dL   Creatinine, Ser 0.62 0.61 - 1.24 mg/dL   Calcium 10.1 8.9 - 10.3 mg/dL   GFR calc non Af Amer >60 >60 mL/min   GFR calc Af Amer >60 >60 mL/min   Anion gap 11 5 - 15    Comment: Performed at [facility] [address] [place], [place] [address]  Magnesium     Status: None   Collection Time: 06/23/20  7:25 AM  Result Value Ref Range   Magnesium 2.2 1.7 - 2.4 mg/dL    Comment: Performed at [facility] [address] [place], [place] [address]  Phosphorus     Status: None   Collection Time: 06/23/20  7:25 AM  Result Value Ref Range   Phosphorus 2.9 2.5 - 4.6 mg/dL    Comment: Performed at [facility] [address], [place], [place] [address]   No results found.     Medical Problem List and Plan: 1.  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  Antithrombotics: -DVT/anticoagulation:  Pharmaceutical: Lovenox--check dopplers in am.   -antiplatelet therapy: N/A 3. Pain Management: N/A 4. Mood: LCSW to follow for evaluation and support.   -antipsychotic agents: N/A 5. Neuropsych: This patient is capable of making decisions on his own behalf. 6. Sacral decub/Skin/Wound Care: Pictures show wound in different stages of healing. Ordered Air mattress overlay. Continue vitamin and prostat to promote healing. Will add Juven also for collagen. Will increase tube feed to 50 cc/hr as likely has higher nutritional need. Will consult dietician to help address nutritional needs.  7. Fluids/Electrolytes/Nutrition:  Monitor I/Os.CMP ordered. Transition to bolus tube feeds next week if stable.  8. Hypotension: Secondary to autonomic dysfunction has resolved.   Monitor with increased activity,  particularly for orthostasis.  9. Acute on chronic anemia: Improving--continue to monitor. Added iron supplement.   CBC ordered. 10. Hypernatremia/Hyperglycemia: Due to intermittent IV dextrose. Added water flushes. May need low sodium formula.  11. Neurogenic bladder: Maintain foley due to sacral wounds.. 12. Neurogenic bowel:Continue senna in am with suppository in evenings for bowel program.   Bary Leriche, PA-C 06/23/2020  I have personally performed a face to face diagnostic evaluation, including, but not limited to relevant history and physical exam findings, of this patient and developed relevant assessment and plan.  Additionally, I have reviewed and concur with the physician assistant's documentation above.  Delice Lesch, MD, ABPMR

## 2020-06-23 NOTE — Progress Notes (Signed)
Suppository given and  Bowel stimulation performed. Pt tolerated well. Small amount of soft brown stool noted to glove

## 2020-06-23 NOTE — Progress Notes (Signed)
Pt arrived to floor by bed. Assessment completed as documented. Pt head to toe assessment, skin assessment completed with Lucina Mellow. Pt oriented to floor and therapy routine. Suction equipment at bedside.
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
## 2020-06-23 NOTE — H&P (Signed)
Physical Medicine and Rehabilitation Admission H&P    CC: Functional deficits due to SCI/quadriplegia    HPI: Ryan Day is a 69 year old male with history of HTN otherwise in relatively good health who was admitted to [facility] on 04/26/2020 after fall, striking his head and face with subsequent qualdraplegia. He was found to have C5 vertebral body extension type teardrop fracture, C3-C6 cord compression with edema and was taken to OR for C3-C6 laminectomy with fusion and repair of small dural tear X 2 by Dr. Macario Carls on the same day. Post op course significant for spinal shock with hypotension and bradycardia, post procedural PTX,  C-diff colitis, significant secretions requiring multiple bronchoscopies due to mucous plugging and ultimately required PEG/Trach on 05/04/20. Hospital course further complicated by respiratory distress with PEA and and ROSC achieved after 8 minutes. Aspiration PNA and intermittent fevers treated, AKI/acute on chronic anemia/abnormal LFTs resolving and he was tolerating PMSV trials.   He was discharged to [facility] on 06/07/20 for management of acute on chronic respiratory failure and for therapy. He has had intermittent issues with pre-renal azotemia and hypernatremia--Na-149 today and started on IV dextrose. Secretions have improved with addition of scopolamine patch, he was downsized to CFS#6  and he is tolerating button plugging. He was found to have E-coli UTI at admission and treated with 7 day course of IV rocephin. CXR done 09/17 due to ongoing secretions and showed right infrahilar PNA which was treated with course of doxycycline. He was made NPO as FEES revealed penetration of nectar to vallecula with difficulty clearing residue and eventual aspiration. Multiple wound on sacrum have evolved and being managed with local measures.  Per RT- no desaturations or mucous plugging during his stay but pulmonary recommends evaluation on outpatient basis prior to decannulation due to  weakness.  Therapy has been ongoing with patient showing improvement in activity tolerance and therapy team was recommending intensive rehab program.  He has a supportive family who plan on providing assistance after discharge. He was felt to be a good CIR candidate. Please see preadmission assessment from earlier today as well.   Review of Systems  Constitutional: Negative for chills and fever.  HENT: Negative for ear pain, hearing loss and tinnitus.   Eyes: Negative for blurred vision and double vision.  Respiratory: Positive for sputum production. Negative for cough, hemoptysis and shortness of breath.   Cardiovascular: Negative for chest pain, palpitations and leg swelling.  Gastrointestinal: Negative for abdominal pain, constipation, heartburn and nausea.  Genitourinary: Negative for dysuria and urgency.  Musculoskeletal: Negative for back pain, joint pain, myalgias and neck pain.  Skin: Negative for itching and rash.  Neurological: Positive for tingling (bilateral hands), sensory change, speech change, focal weakness and weakness. Negative for dizziness and headaches.  Psychiatric/Behavioral: The patient has insomnia. The patient is not nervous/anxious.   All other systems reviewed and are negative.    Past Medical History:  Diagnosis Date  . Acute on chronic respiratory failure with hypoxia ([place])   . Acute renal injury due to hypovolemia ([place])   . Autonomic instability   . Cardiac arrest ([place])   . Cervical spinal cord injury, sequela ([place])   . Elevated alkaline phosphatase level   . 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 Hyperlipidemia   . Hypertension     Past Surgical History:  Procedure Laterality Date  . COLONOSCOPY WITH PROPOFOL N/A 12/05/2017   Procedure: COLONOSCOPY WITH PROPOFOL;  Surgeon: Lin Landsman, MD;  Location: [facility] ENDOSCOPY;  Service: Gastroenterology;  Laterality: N/A;  . ESOPHAGOGASTRODUODENOSCOPY  12/05/2017   Procedure:  ESOPHAGOGASTRODUODENOSCOPY (EGD);  Surgeon: Lin Landsman, MD;  Location: [facility] ENDOSCOPY;  Service: Gastroenterology;;    Family History  Problem Relation Age of Onset  . Cancer Father        lung  . Cancer Brother        throat  . Heart disease Brother 83  . Liver cancer Brother   . Alcohol abuse Brother     Social History:  Retired. Independent without AD. Wife works--CNA and has 2 daughtrs who are nurses (one at [employer] floor). He reports that he has never smoked. He has never used smokeless tobacco. He reports current alcohol use--couple of drinks per week.  He reports that he does not use drugs.    Allergies: No Known Allergies    Medications Prior to Admission  Medication Sig Dispense Refill  . aspirin 81 MG chewable tablet Chew 81 mg by mouth daily.    . hydrochlorothiazide (HYDRODIURIL) 25 MG tablet Take 1 tablet (25 mg total) by mouth daily. 90 tablet 1  . simvastatin (ZOCOR) 40 MG tablet Take 1 tablet (40 mg total) by mouth daily. 90 tablet 1    Drug Regimen Review  Drug regimen was reviewed and remains appropriate with no significant issues identified  Home: Home Living Living Arrangements: Spouse/significant other Available Help at Discharge: Family, Available 24 hours/day (Wife will stop working when patient is discharged from CIR) Type of Home: Mobile home (Double wide mobile home) Home Access: Stairs to enter Technical brewer of Steps: 5 Entrance Stairs-Rails: Right, Left, Can reach both Home Layout: One level Bathroom Shower/Tub: Tub/shower unit, Industrial/product designer: Yes  Lives With: Spouse   Functional History: Prior Function Level of Independence: Independent  Functional Status:  Mobility: Dependent for bed mobility. Able to sit at edge of bed with max assist.      ADL: Working on [place].   Cognition:       Height 6\' 5"  (1.956 m), weight 105.2 kg. Physical Exam Vitals and  nursing note reviewed.  Constitutional:      Appearance: Normal appearance.     Comments: Sitting supported in bed--NAD. Trach button plugged and no issues with secretions during exam. .   HENT:     Head: Normocephalic and atraumatic.     Right Ear: External ear normal.     Left Ear: External ear normal.     Nose: Nose normal.  Eyes:     General:        Right eye: No discharge.        Left eye: No discharge.     Extraocular Movements: Extraocular movements intact.  Neck:     Comments: +Capped trach Cardiovascular:     Rate and Rhythm: Normal rate and regular rhythm.  Pulmonary:     Effort: Pulmonary effort is normal.     Breath sounds: Normal breath sounds.  Abdominal:     General: Abdomen is flat. There is distension.     Comments: +PEG  Musculoskeletal:     Comments: No edema or tenderness in extremities  Skin:    General: Skin is warm and dry.     Comments: Sacral ulcer not examined  Neurological:     Mental Status: He is alert and oriented to person, place, and time.     Comments: Dysphonia (wife reports close to baseline).  Motor: B/l UE shoulder abduction  1+/5, distally 0/5 with ?trace flicker left wrist flexor B/l LE: 0/5 proximal to distal Sensation diminished to light touch in all extremities  Psychiatric:        Mood and Affect: Mood normal.        Behavior: Behavior normal.    Results for orders placed or performed during the hospital encounter of 06/07/20 (from the past 48 hour(s))  CBC     Status: Abnormal   Collection Time: 06/23/20  7:25 AM  Result Value Ref Range   WBC 7.0 4.0 - 10.5 K/uL   RBC 3.64 (L) 4.22 - 5.81 MIL/uL   Hemoglobin 10.8 (L) 13.0 - 17.0 g/dL   HCT 33.4 (L) 39 - 52 %   MCV 91.8 80.0 - 100.0 fL   MCH 29.7 26.0 - 34.0 pg   MCHC 32.3 30.0 - 36.0 g/dL   RDW 15.0 11.5 - 15.5 %   Platelets 168 150 - 400 K/uL   nRBC 0.0 0.0 - 0.2 %    Comment: Performed at [facility], [place] [address] [place], [place] [address]  Basic metabolic  panel     Status: Abnormal   Collection Time: 06/23/20  7:25 AM  Result Value Ref Range   Sodium 146 (H) 135 - 145 mmol/L   Potassium 3.6 3.5 - 5.1 mmol/L   Chloride 106 98 - 111 mmol/L   CO2 29 22 - 32 mmol/L   Glucose, Bld 100 (H) 70 - 99 mg/dL    Comment: Glucose reference range applies only to samples taken after fasting for at least 8 hours.   BUN 22 8 - 23 mg/dL   Creatinine, Ser 0.62 0.61 - 1.24 mg/dL   Calcium 10.1 8.9 - 10.3 mg/dL   GFR calc non Af Amer >60 >60 mL/min   GFR calc Af Amer >60 >60 mL/min   Anion gap 11 5 - 15    Comment: Performed at [facility] [address], [place], [place] [address]  Magnesium     Status: None   Collection Time: 06/23/20  7:25 AM  Result Value Ref Range   Magnesium 2.2 1.7 - 2.4 mg/dL    Comment: Performed at [facility] [address], [place], [place] [address]  Phosphorus     Status: None   Collection Time: 06/23/20  7:25 AM  Result Value Ref Range   Phosphorus 2.9 2.5 - 4.6 mg/dL    Comment: Performed at [facility] [address] [place],  [address]   No results found.     Medical Problem List and Plan: 1.  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  Antithrombotics: -DVT/anticoagulation:  Pharmaceutical: Lovenox--check dopplers in am.   -antiplatelet therapy: N/A 3. Pain Management: N/A 4. Mood: LCSW to follow for evaluation and support.   -antipsychotic agents: N/A 5. Neuropsych: This patient is capable of making decisions on his own behalf. 6. Sacral decub/Skin/Wound Care: Pictures show wound in different stages of healing. Ordered Air mattress overlay. Continue vitamin and prostat to promote healing. Will add Juven also for collagen. Will increase tube feed to 50 cc/hr as likely has higher nutritional need. Will consult dietician to help address nutritional needs.  7. Fluids/Electrolytes/Nutrition:  Monitor I/Os.CMP ordered. Transition to bolus tube feeds next week if stable.  8. Hypotension: Secondary to autonomic dysfunction has resolved.   Monitor with increased activity,  particularly for orthostasis.  9. Acute on chronic anemia: Improving--continue to monitor. Added iron supplement.   CBC ordered. 10. Hypernatremia/Hyperglycemia: Due to intermittent IV dextrose. Added water flushes. May need low sodium formula.  11. Neurogenic bladder: Maintain foley due to sacral wounds.. 12. Neurogenic bowel:Continue senna in am with suppository in evenings for bowel program.   Bary Leriche, PA-C 06/23/2020  I have personally performed a face to face diagnostic evaluation, including, but not limited to relevant history and physical exam findings, of this patient and developed relevant assessment and plan.  Additionally, I have reviewed and concur with the physician assistant's documentation above.  Delice Lesch, MD, ABPMR The patient's status has not changed. The original post admission physician evaluation remains appropriate, and any changes from the pre-admission screening or documentation from the acute chart are noted above.   Delice Lesch, MD, ABPMR

## 2020-06-24 ENCOUNTER — Inpatient Hospital Stay (HOSPITAL_COMMUNITY): Payer: BC Managed Care – PPO

## 2020-06-24 ENCOUNTER — Inpatient Hospital Stay (HOSPITAL_COMMUNITY): Payer: BC Managed Care – PPO | Admitting: Physical Therapy

## 2020-06-24 DIAGNOSIS — S14109S Unspecified injury at unspecified level of cervical spinal cord, sequela: Secondary | ICD-10-CM

## 2020-06-24 LAB — CBC WITH DIFFERENTIAL/PLATELET
Abs Immature Granulocytes: 0.02 10*3/uL (ref 0.00–0.07)
Basophils Absolute: 0 10*3/uL (ref 0.0–0.1)
Basophils Relative: 0 %
Eosinophils Absolute: 0.2 10*3/uL (ref 0.0–0.5)
Eosinophils Relative: 2 %
HCT: 32.9 % — ABNORMAL LOW (ref 39.0–52.0)
Hemoglobin: 10.6 g/dL — ABNORMAL LOW (ref 13.0–17.0)
Immature Granulocytes: 0 %
Lymphocytes Relative: 15 %
Lymphs Abs: 1 10*3/uL (ref 0.7–4.0)
MCH: 29.8 pg (ref 26.0–34.0)
MCHC: 32.2 g/dL (ref 30.0–36.0)
MCV: 92.4 fL (ref 80.0–100.0)
Monocytes Absolute: 0.8 10*3/uL (ref 0.1–1.0)
Monocytes Relative: 11 %
Neutro Abs: 5.2 10*3/uL (ref 1.7–7.7)
Neutrophils Relative %: 72 %
Platelets: 163 10*3/uL (ref 150–400)
RBC: 3.56 MIL/uL — ABNORMAL LOW (ref 4.22–5.81)
RDW: 15.3 % (ref 11.5–15.5)
WBC: 7.2 10*3/uL (ref 4.0–10.5)
nRBC: 0 % (ref 0.0–0.2)

## 2020-06-24 LAB — GLUCOSE, CAPILLARY
Glucose-Capillary: 109 mg/dL — ABNORMAL HIGH (ref 70–99)
Glucose-Capillary: 121 mg/dL — ABNORMAL HIGH (ref 70–99)
Glucose-Capillary: 123 mg/dL — ABNORMAL HIGH (ref 70–99)
Glucose-Capillary: 127 mg/dL — ABNORMAL HIGH (ref 70–99)
Glucose-Capillary: 131 mg/dL — ABNORMAL HIGH (ref 70–99)
Glucose-Capillary: 135 mg/dL — ABNORMAL HIGH (ref 70–99)

## 2020-06-24 LAB — COMPREHENSIVE METABOLIC PANEL
ALT: 18 U/L (ref 0–44)
AST: 15 U/L (ref 15–41)
Albumin: 2.5 g/dL — ABNORMAL LOW (ref 3.5–5.0)
Alkaline Phosphatase: 126 U/L (ref 38–126)
Anion gap: 12 (ref 5–15)
BUN: 27 mg/dL — ABNORMAL HIGH (ref 8–23)
CO2: 27 mmol/L (ref 22–32)
Calcium: 10 mg/dL (ref 8.9–10.3)
Chloride: 105 mmol/L (ref 98–111)
Creatinine, Ser: 0.72 mg/dL (ref 0.61–1.24)
GFR calc Af Amer: >60 mL/min (ref >60)
GFR calc non Af Amer: >60 mL/min (ref >60)
Glucose, Bld: 118 mg/dL — ABNORMAL HIGH (ref 70–99)
Potassium: 3.5 mmol/L (ref 3.5–5.1)
Sodium: 144 mmol/L (ref 135–145)
Total Bilirubin: 0.9 mg/dL (ref 0.3–1.2)
Total Protein: 7.3 g/dL (ref 6.5–8.1)

## 2020-06-24 MED ORDER — POLYETHYLENE GLYCOL 3350 17 G PO PACK: 17.0000 g | PACK | Freq: Every day | ORAL | Status: DC | PRN

## 2020-06-24 MED ORDER — DIPHENHYDRAMINE HCL 12.5 MG/5ML PO ELIX: 12.5000 mg | ORAL_SOLUTION | Freq: Four times a day (QID) | ORAL | Status: DC | PRN

## 2020-06-24 MED ORDER — PROSOURCE TF PO LIQD
45.0000 mL | Freq: Every day | ORAL | Status: DC
  Administered 2020-06-24 – 2020-06-27 (×13): 45 mL
  Filled 2020-06-24 (×12): qty 45

## 2020-06-24 MED ORDER — OSMOLITE 1.5 CAL PO LIQD
1000.0000 mL | ORAL | Status: DC
  Administered 2020-06-24 – 2020-06-29 (×6): 1000 mL
  Filled 2020-06-24 (×7): qty 1000

## 2020-06-24 MED ORDER — ALUM & MAG HYDROXIDE-SIMETH 200-200-20 MG/5ML PO SUSP
30.0000 mL | ORAL | Status: DC | PRN
  Administered 2020-07-02: 30 mL
  Filled 2020-06-24: qty 30

## 2020-06-24 MED ORDER — CHLORHEXIDINE GLUCONATE CLOTH 2 % EX PADS
6.0000 | MEDICATED_PAD | Freq: Two times a day (BID) | CUTANEOUS | Status: DC
  Administered 2020-06-24 – 2020-07-20 (×51): 6 via TOPICAL

## 2020-06-24 MED ORDER — GUAIFENESIN-DM 100-10 MG/5ML PO SYRP: 5.0000 mL | ORAL_SOLUTION | Freq: Four times a day (QID) | ORAL | Status: DC | PRN

## 2020-06-24 MED ORDER — FREE WATER
225.0000 mL | Status: DC
  Administered 2020-06-24 – 2020-06-27 (×16): 225 mL

## 2020-06-24 MED ORDER — ACETAMINOPHEN 160 MG/5ML PO SOLN
320.0000 mg | ORAL | Status: DC | PRN
  Administered 2020-06-25 – 2020-07-04 (×6): 640 mg
  Administered 2020-07-11: 320 mg
  Administered 2020-07-16 – 2020-07-19 (×2): 640 mg
  Filled 2020-06-24 (×10): qty 20.3

## 2020-06-24 NOTE — Progress Notes (Signed)
Initial Nutrition Assessment  DOCUMENTATION CODES:   Not applicable  INTERVENTION:   Initiate tube feeding via PEG: Osmolite 1.5 at 80 ml/h x 20 hours per day (allowing for 4 hours off TF for therapy) (1600 ml per day) Prosource TF 45 ml 5 times per day  Provides 2600 kcal, 155 gm protein, 1219 ml free water daily.  Juven BID via tube, each packet provides 80 calories, 8 grams of carbohydrate, 2.5 grams of protein (collagen), 7 grams of L-arginine and 7 grams of L-glutamine; supplement contains CaHMB, Vitamins C, E, B12 and Zinc to promote wound healing.  Free water flushes 225 ml every 4 hours for a total of 1350 ml additional free water (2569 ml total with TF + flushes).  MVI daily.  NUTRITION DIAGNOSIS:   Increased nutrient needs related to wound healing as evidenced by estimated needs.  GOAL:   Patient will meet greater than or equal to 90% of their needs  MONITOR:   TF tolerance, Skin, Diet advancement, Labs  REASON FOR ASSESSMENT:   Consult Enteral/tube feeding initiation and management  ASSESSMENT:   69 yo male admitted with functional deficits due to SCI/quadriplegia. PMH includes recent admission to [facility] 8/4 s/p fall, striking his head, and resulting in quadriplegia. Hospitalization complicated by PTX, C diff colitis, PEA, respiratory distress, aspiration PNA, AKI. Also required trach and PEG. Discharged to [facility] 9/15. Transferred to [facility] for intense rehab as recommended by his therapy team.   Received MD Consult for TF initiation and management. Currently receiving Vital 1.5 at 50 ml/h with Prosource TF 45 ml BID via PEG. Free water 200 ml TID. Juven BID to support wound healing.   Patient is currently on room air, trach is capped.  Labs reviewed.  CBG: [phone]  Medications reviewed and include Florastor, Senokot, Juven, liquid MVI, novoLOG, vitamin C, zinc.  Weights reviewed. Patient was 108.1 kg in May 2021. Currently 105.2  kg.  Expect to see weight loss r/t limited movement with quadriplegia. RD working remotely, unable to complete nutrition focused physical exam.  Diet Order:   Diet Order            Diet NPO time specified  Diet effective now                 EDUCATION NEEDS:   Not appropriate for education at this time  Skin:  Skin Assessment: Skin Integrity Issues: Skin Integrity Issues:: Stage II Stage II: coccyx, buttocks  Last BM:  10/2  Height:   Ht Readings from Last 1 Encounters:  06/23/20 6\' 5"  (1.956 m)    Weight:   Wt Readings from Last 1 Encounters:  06/23/20 105.2 kg    Ideal Body Weight:  94.5 kg  BMI:  Body mass index is 27.5 kg/m.  Estimated Nutritional Needs:   Kcal:  2400-2600  Protein:  145-165 gm  Fluid:  >/= 2.4 L    Lucas Mallow, RD, LDN, CNSC Please refer to Amion for contact information.

## 2020-06-24 NOTE — Evaluation (Signed)
Occupational Therapy Assessment and Plan  Patient Details  Name: Ryan Day MRN: [id number] Date of Birth: 1950-10-07  OT Diagnosis: abnormal posture, muscle weakness (generalized) and quadriparesis at level C3-5 Rehab Potential: Rehab Potential (ACUTE ONLY): Good ELOS: 4 weeks   Today's Date: 06/24/2020 OT Individual Time: 1100-1200 OT Individual Time Calculation (min): 60 min     Hospital Problem: Principal Problem:   Spinal cord injury, cervical region, sequela ([place]) Active Problems:   Pressure injury of skin   Past Medical History:  Past Medical History:  Diagnosis Date   Acute on chronic respiratory failure with hypoxia ([place])    Acute renal injury due to hypovolemia [facility])    Autonomic instability    Cardiac arrest (HCC)    Cervical spinal cord injury, sequela (HCC)    Elevated alkaline phosphatase level    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    Hyperlipidemia    Hypertension    Past Surgical History:  Past Surgical History:  Procedure Laterality Date   COLONOSCOPY WITH PROPOFOL N/A 12/05/2017   Procedure: COLONOSCOPY WITH PROPOFOL;  Surgeon: Lin Landsman, MD;  Location: [facility] ENDOSCOPY;  Service: Gastroenterology;  Laterality: N/A;   ESOPHAGOGASTRODUODENOSCOPY  12/05/2017   Procedure: ESOPHAGOGASTRODUODENOSCOPY (EGD);  Surgeon: Lin Landsman, MD;  Location: [facility] ENDOSCOPY;  Service: Gastroenterology;;    Assessment & Plan Clinical Impression:[name] is a 69 year old male with history of HTN otherwise in relatively good health who was admitted to [facility] on 04/26/2020 after fall, striking his head and face with subsequent qualdraplegia. He was found to have C5 vertebral body extension type teardrop fracture, C3-C6 cord compression with edema and was taken to OR for C3-C6 laminectomy with fusion and repair of small dural tear X 2 by Dr. Macario Carls on the same day. Post op course significant for spinal shock with hypotension and bradycardia, post  procedural PTX,  C-diff colitis, significant secretions requiring multiple bronchoscopies due to mucous plugging and ultimately required PEG/Trach on 05/04/20. Hospital course further complicated by respiratory distress with PEA and and ROSC achieved after 8 minutes. Aspiration PNA and intermittent fevers treated, AKI/acute on chronic anemia/abnormal LFTs resolving and he was tolerating PMSV trials.   He was discharged to [facility] on 06/07/20 for management of acute on chronic respiratory failure and for therapy. He has had intermittent issues with pre-renal azotemia and hypernatremia--Na-149 today and started on IV dextrose. Secretions have improved with addition of scopolamine patch, he was downsized to CFS#6  and he is tolerating button plugging. He was found to have E-coli UTI at admission and treated with 7 day course of IV rocephin. CXR done 09/17 due to ongoing secretions and showed right infrahilar PNA which was treated with course of doxycycline. He was made NPO as FEES revealed penetration of nectar to vallecula with difficulty clearing residue and eventual aspiration. Multiple wound on sacrum have evolved and being managed with local measures.  Per RT- no desaturations or mucous plugging during his stay but pulmonary recommends evaluation on outpatient basis prior to decannulation due to weakness.  Therapy has been ongoing with patient showing improvement in activity tolerance and therapy team was recommending intensive rehab program.  He has a supportive family who plan on providing assistance after discharge. He was felt to be a good CIR candidate. Please see preadmission assessment from earlier today as well.    Patient currently requires total +2 A with basic self-care skills secondary to muscle weakness, decreased cardiorespiratoy endurance, impaired timing and sequencing, abnormal tone  and unbalanced muscle activation and decreased sitting balance and decreased balance strategies.  Prior to  hospitalization, patient could complete BADL/IADL with independent .  Patient will benefit from skilled intervention to decrease level of assist with basic self-care skills and increase independence with basic self-care skills prior to discharge home with care partner.  Anticipate patient will require max physical asisstance and follow up home health.  OT - End of Session Activity Tolerance: Tolerates 30+ min activity with multiple rests OT Assessment Rehab Potential (ACUTE ONLY): Good OT Barriers to Discharge: Inaccessible home environment;Home environment access/layout;Neurogenic Bowel & Bladder;Wound Care OT Patient demonstrates impairments in the following area(s): Balance;Edema;Endurance;Motor;Nutrition;Pain;Skin Integrity;Cognition OT Basic ADL's Functional Problem(s): Eating;Grooming;Bathing;Dressing;Toileting OT Transfers Functional Problem(s): Toilet OT Additional Impairment(s): Fuctional Use of Upper Extremity OT Plan OT Intensity: Minimum of 1-2 x/day, 45 to 90 minutes OT Frequency: 5 out of 7 days OT Duration/Estimated Length of Stay: 4 weeks OT Treatment/Interventions: Balance/vestibular training;Discharge planning;Functional electrical stimulation;Pain management;Self Care/advanced ADL retraining;Therapeutic Activities;UE/LE Coordination activities;Therapeutic Exercise;Skin care/wound managment;Patient/family education;Functional mobility training;Disease mangement/prevention;Community reintegration;DME/adaptive equipment instruction;Neuromuscular re-education;Psychosocial support;Splinting/orthotics;UE/LE Strength taining/ROM;Wheelchair propulsion/positioning OT Self Feeding Anticipated Outcome(s): direct care with S OT Basic Self-Care Anticipated Outcome(s): Total A of 1 OT Toileting Anticipated Outcome(s): total A of 1 OT Bathroom Transfers Anticipated Outcome(s): total A of 1 OT Recommendation Patient destination: Home Follow Up Recommendations: Home health OT Equipment  Recommended: To be determined   OT Evaluation Precautions/Restrictions  Precautions Precautions: Cervical Precaution Booklet Issued: No Precaution Comments: C collar donned when OOB or upright Restrictions Weight Bearing Restrictions: No General Chart Reviewed: Yes Family/Caregiver Present: No Vital Signs Therapy Vitals Pulse Rate: 72 Resp: 18 Patient Position (if appropriate): Lying Oxygen Therapy SpO2: 98 % O2 Device: Room Air Pain Pain Assessment Pain Score: 0-No pain Home Living/Prior Functioning Home Living Family/patient expects to be discharged to:: Private residence Living Arrangements: Spouse/significant other Available Help at Discharge: Family, Available 24 hours/day Type of Home: Mobile home Entrance Stairs-Number of Steps: 5 Entrance Stairs-Rails: Right, Left, Can reach both Home Layout: One level Bathroom Shower/Tub: Tub/shower unit, Air cabin crew Accessibility: Yes Prior Function Level of Independence: Independent with basic ADLs, Independent with homemaking with ambulation  Able to Take Stairs?: Yes Driving: Yes Vision Baseline Vision/History: Wears glasses Wears Glasses: Reading only Patient Visual Report: No change from baseline Perception  Perception: Within Functional Limits Praxis Praxis: Intact Cognition Overall Cognitive Status: Impaired/Different from baseline Arousal/Alertness: Awake/alert Orientation Level: Person;Situation;Place Person: Oriented Place: Oriented Situation: Oriented Year: 2021 Month: October Day of Week: Correct Memory: Impaired Immediate Memory Recall: Sock;Blue;Bed Memory Recall Sock: Not able to recall Memory Recall Blue: With Cue Memory Recall Bed: Not able to recall Awareness: Appears intact (impaired d/t denial of severity of injury) Safety/Judgment: Appears intact Sensation Sensation Light Touch: Impaired by gross assessment Central sensation comments: Pt able to feel OT  wiping buttocks Peripheral sensation comments: Pt with limited distal sensation on RUE and B feet Hot/Cold: Impaired by gross assessment Proprioception: Impaired by gross assessment Stereognosis: Impaired by gross assessment Coordination Gross Motor Movements are Fluid and Coordinated: No Fine Motor Movements are Fluid and Coordinated: No Finger Nose Finger Test: unable Motor  Motor Motor: Tetraplegia  Trunk/Postural Assessment  Cervical Assessment Cervical Assessment:  (C collar) Thoracic Assessment Thoracic Assessment:  (rounded shoudlers) Lumbar Assessment Lumbar Assessment:  (post pelvic tilt) Postural Control Postural Control: Deficits on evaluation Righting Reactions: absent/insufficient Protective Responses: absent  Balance Balance Balance Assessed: Yes Static Sitting Balance Static Sitting - Balance Support: Bilateral upper extremity  supported Static Sitting - Level of Assistance: 1: +2 Total assist Static Sitting - Comment/# of Minutes: total +2 initially; fading to MOD/MAX A of 1 Extremity/Trunk Assessment RUE Assessment RUE Assessment: Exceptions to [facility] General Strength Comments: shoulder/scapular elevation/depression 3/5; no activaiton proximal-distal RUE Body System: Neuro LUE Assessment LUE Assessment: Exceptions to [facility] General Strength Comments: shoulder/scapular elevation/depression 3/5; no activaiton proximal-distal LUE Body System: Neuro  Care Tool Care Tool Self Care Eating        Oral Care    Oral Care Assist Level: Dependent - Patient 0%)    Bathing         Assist Level: 2 Helpers    Upper Body Dressing(including orthotics)       Assist Level: 2 Helpers    Lower Body Dressing (excluding footwear)     Assist for lower body dressing: 2 Helpers    Putting on/Taking off footwear     Assist for footwear: Dependent - Patient 0%       Care Tool Toileting Toileting activity   Assist for toileting: Dependent - Patient 0%     Care  Tool Bed Mobility Roll left and right activity   Roll left and right assist level: 2 Helpers    Sit to lying activity   Sit to lying assist level: 2 Helpers    Lying to sitting edge of bed activity   Lying to sitting edge of bed assist level: 2 Helpers     Care Tool Transfers Sit to stand transfer Sit to stand activity did not occur: Safety/medical concerns      Chair/bed transfer Chair/bed transfer activity did not occur: Safety/medical concerns       Toilet transfer Toilet transfer activity did not occur: Safety/medical concerns       Care Tool Cognition Expression of Ideas and Wants Expression of Ideas and Wants: Without difficulty (complex and basic) - expresses complex messages without difficulty and with speech that is clear and easy to understand   Understanding Verbal and Non-Verbal Content Understanding Verbal and Non-Verbal Content: Understands (complex and basic) - clear comprehension without cues or repetitions   Memory/Recall Ability *first 3 days only Memory/Recall Ability *first 3 days only: Current season;That he or she is in a hospital/hospital unit    Refer to Care Plan for Real 1 OT Short Term Goal 1 (Week 1): Pt will maintain static sitting balnace wiht MAX A of 1 caregiver for 3 min at EOB/EOM OT Short Term Goal 2 (Week 1): Pt will direct functional transfers with max VC OT Short Term Goal 3 (Week 1): Pt will roll B in bed with MOD +2 A  Recommendations for other services: None    Skilled Therapeutic Intervention 1;1. Pt receive in bed agreeable to OT after edu re OT role/purpose, CIR, ELOS and POC. Pt very soft spoken throughout. Pt provided with total A of 2 to roll in B directions to cleanse buttocks after BM, change brief and pants. Pt requires total A +2 to don shirt. Pt with shoulder elevation on assessment but no other movements noted. Pt educated on SCI recovery and assistive technology options as pt has a Museum/gallery exhibitions officer. Pt would benefit from AT set up with training with voice control or mouth stick to access phone to control environment and be able to place phone calls. Pt sits EOB with +2 A initially fading to MAX A of 1 (momentarily) MOD A at times. Exited session with pt in  bed, exit alarm on and call light I nreach  ADL ADL Eating: NPO Grooming: Dependent Upper Body Bathing: Dependent Lower Body Bathing: Dependent Upper Body Dressing: Dependent Where Assessed-Upper Body Dressing: Bed level Lower Body Dressing: Dependent Where Assessed-Lower Body Dressing: Bed level Toileting: Dependent Where Assessed-Toileting: Bed level Mobility  Bed Mobility Bed Mobility: Rolling Right;Rolling Left;Right Sidelying to Sit Rolling Right: 2 Helpers Rolling Left: 2 Helpers Right Sidelying to Sit: 2 Helpers   Discharge Criteria: Patient will be discharged from OT if patient refuses treatment 3 consecutive times without medical reason, if treatment goals not met, if there is a change in medical status, if patient makes no progress towards goals or if patient is discharged from hospital.  The above assessment, treatment plan, treatment alternatives and goals were discussed and mutually agreed upon: by patient  Tonny Branch 06/24/2020, 12:29 PM

## 2020-06-24 NOTE — Progress Notes (Signed)
Occupational Therapy Session Note  Patient Details  Name: Ryan Day MRN: [id number] Date of Birth: August 14, 1951  Today's Date: 06/24/2020 OT Individual Time: 1600-1610 OT Individual Time Calculation (min): 10 min    Short Term Goals: Week 1:  OT Short Term Goal 1 (Week 1): Pt will maintain static sitting balnace wiht MAX A of 1 caregiver for 3 min at EOB/EOM OT Short Term Goal 2 (Week 1): Pt will direct functional transfers with max VC OT Short Term Goal 3 (Week 1): Pt will roll B in bed with MOD +2 A  Skilled Therapeutic Interventions/Progress Updates:    1:1. Pt received in bed agreeable to problem solving suction independence. OT attaches modular hose to EOB with adjustable C clamp and fits to pt. With yonker on and taped to modular hose, pt able to independently bring mouth to suction to manage secretions. Pt pleased with set up and will adjust as needed. Exited session with pt in bed and soft touch call bell behind head Therapy Documentation Precautions:  Precautions Precautions: Cervical Precaution Booklet Issued: No Precaution Comments: C collar on when egde of bed, upright or OOB Required Braces or Orthoses: Cervical Brace Cervical Brace: Hard collar, Other (comment) (C collar when edge of bed, upright or OOB) Restrictions Weight Bearing Restrictions: No General:   Vital Signs: Therapy Vitals Temp: (!) 97.5 F (36.4 C) Pulse Rate: 62 Resp: 20 BP: 113/75 Patient Position (if appropriate): Lying Oxygen Therapy SpO2: 97 % O2 Device: Room Air Pain: Pain Assessment Pain Scale: 0-10 Pain Score: 0-No pain ADL: ADL Eating: NPO Grooming: Dependent Upper Body Bathing: Dependent Lower Body Bathing: Dependent Upper Body Dressing: Dependent Where Assessed-Upper Body Dressing: Bed level Lower Body Dressing: Dependent Where Assessed-Lower Body Dressing: Bed level Toileting: Dependent Where Assessed-Toileting: Bed level Vision Baseline Vision/History: Wears  glasses Wears Glasses: Reading only Patient Visual Report: No change from baseline Perception  Perception: Within Functional Limits Praxis Praxis: Intact Exercises:   Other Treatments:     Therapy/Group: Individual Therapy  Tonny Branch 06/24/2020, 4:23 PM

## 2020-06-24 NOTE — Evaluation (Signed)
Physical Therapy Assessment and Plan  Patient Details  Name: Ryan Day MRN: [id number] Date of Birth: 10/30/1950  PT Diagnosis: Quadriplegia Rehab Potential: Fair ELOS: 25 to 28 days   Today's Date: 06/24/2020 PT Individual Time: 1301-1412 PT Individual Time Calculation (min): 71 min    Hospital Problem: Principal Problem:   Spinal cord injury, cervical region, sequela (HCC) Active Problems:   Pressure injury of skin   Past Medical History:  Past Medical History:  Diagnosis Date  . Acute on chronic respiratory failure with hypoxia (HCC)   . Acute renal injury due to hypovolemia (HCC)   . Autonomic instability   . Cardiac arrest (HCC)   . Cervical spinal cord injury, sequela (HCC)   . Elevated alkaline phosphatase level   . 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 Hyperlipidemia   . Hypertension    Past Surgical History:  Past Surgical History:  Procedure Laterality Date  . COLONOSCOPY WITH PROPOFOL N/A 12/05/2017   Procedure: COLONOSCOPY WITH PROPOFOL;  Surgeon: Vanga, Rohini Reddy, MD;  Location: [facility] ENDOSCOPY;  Service: Gastroenterology;  Laterality: N/A;  . ESOPHAGOGASTRODUODENOSCOPY  12/05/2017   Procedure: ESOPHAGOGASTRODUODENOSCOPY (EGD);  Surgeon: Vanga, Rohini Reddy, MD;  Location: [facility] ENDOSCOPY;  Service: Gastroenterology;;    Assessment & Plan Clinical Impression: Patient is a 69 year old male with history of HTN otherwise in relatively good health who was admitted to [facility] on 04/26/2020 after fall, striking his head and face with subsequent qualdraplegia. He was found to have C5 vertebral body extension type teardrop fracture, C3-C6 cord compression with edema and was taken to OR for C3-C6 laminectomy with fusion and repair of small dural tear X 2 by Dr. Mendoza on the same day. Post op course significant for spinal shock with hypotension and bradycardia, post procedural PTX,  C-diff colitis, significant secretions requiring multiple bronchoscopies due to  mucous plugging and ultimately required PEG/Trach on 05/04/20. Hospital course further complicated by respiratory distress with PEA and and ROSC achieved after 8 minutes. Aspiration PNA and intermittent fevers treated, AKI/acute on chronic anemia/abnormal LFTs resolving and he was tolerating PMSV trials.   He was discharged to [facility] on 06/07/20 for management of acute on chronic respiratory failure and for therapy. He has had intermittent issues with pre-renal azotemia and hypernatremia--Na-149 today and started on IV dextrose. Secretions have improved with addition of scopolamine patch, he was downsized to CFS#6  and he is tolerating button plugging. He was found to have E-coli UTI at admission and treated with 7 day course of IV rocephin. CXR done 09/17 due to ongoing secretions and showed right infrahilar PNA which was treated with course of doxycycline. He was made NPO as MBSS revealed penetration of nectar to vallecula with difficulty clearing residue and eventual aspiration. Multiple wound on sacrum have evolved and being managed with local measures.  Per RT- no desaturations or mucous plugging during his stay but pulmonary recommends evaluation on outpatient basis prior to decannulation due to weakness.  Therapy has been ongoing with patient showing improvement in activity tolerance and therapy team was recommending intensive rehab program.  He has a supportive family who plan on providing assistance after discharge. He was felt to be a good CIR candidate. Please see preadmission assessment from earlier today as well.   Patient transferred to CIR on 06/23/2020 .   Patient currently requires total with mobility secondary to muscle paralysis and abnormal tone.  Prior to hospitalization, patient was independent  with mobility and lived with Spouse   in a Mobile home home.  Home access is 5Stairs to enter.  Patient will benefit from skilled PT intervention to maximize safe functional mobility, minimize fall  risk and decrease caregiver burden for planned discharge home with 24 hour assist.  Anticipate patient will benefit from follow up HH at discharge.  PT - End of Session Activity Tolerance: Decreased this session PT Assessment Rehab Potential (ACUTE/IP ONLY): Fair PT Barriers to Discharge: Inaccessible home environment;Decreased caregiver support PT Barriers to Discharge Comments: 5 stairs to enter with B rails PT Patient demonstrates impairments in the following area(s): Balance;Motor;Safety;Endurance PT Transfers Functional Problem(s): Bed Mobility;Bed to Chair;Car PT Locomotion Functional Problem(s): Wheelchair Mobility PT Plan PT Intensity: Minimum of 1-2 x/day ,45 to 90 minutes PT Frequency: 5 out of 7 days PT Duration Estimated Length of Stay: 25 to 28 days PT Treatment/Interventions: Balance/vestibular training;Community reintegration;Discharge planning;DME/adaptive equipment instruction;Functional mobility training;Neuromuscular re-education;Patient/family education;Therapeutic Activities;Therapeutic Exercise;UE/LE Strength taining/ROM;UE/LE Coordination activities;Wheelchair propulsion/positioning PT Transfers Anticipated Outcome(s): [name] will be independent with transfers PT Locomotion Anticipated Outcome(s): w/c mod I PT Recommendation Recommendations for Other Services: Neuropsych consult Follow Up Recommendations: Home health PT Patient destination: Home Equipment Recommended: To be determined   PT Evaluation Precautions/Restrictions Precautions Precautions: Cervical Precaution Booklet Issued: No Precaution Comments: C collar on when egde of bed, upright or OOB Required Braces or Orthoses: Cervical Brace Cervical Brace: Hard collar;Other (comment) (C collar when edge of bed, upright or OOB) Restrictions Weight Bearing Restrictions: No General Chart Reviewed: Yes Family/Caregiver Present: No  Therapy Vitals HR 60s throughout treatment Oxygen Therapy SpO2:  94-96% Device: Room Air Pain Pain Assessment Pain Scale: 0-10 Pain Score: 0-No pain Home Living/Prior Functioning Home Living Available Help at Discharge: Family;Available 24 hours/day Type of Home: Mobile home Home Access: Stairs to enter Entrance Stairs-Number of Steps: 5 Entrance Stairs-Rails: Right;Left;Can reach both Home Layout: One level Bathroom Shower/Tub: Tub/shower unit;Curtain Bathroom Toilet: Standard Bathroom Accessibility: Yes  Lives With: Spouse Prior Function Level of Independence: Independent with basic ADLs;Independent with gait;Independent with transfers  Able to Take Stairs?: Yes Driving: Yes Comments: as per patient, has a friend who will be installing a ramp Vision/Perception  Perception Perception: Within Functional Limits Praxis Praxis: Intact  Cognition Overall Cognitive Status: Impaired/Different from baseline Arousal/Alertness: Awake/alert Memory: Impaired Awareness: Impaired Awareness Impairment: Other (comment) (impaired insight into severity of injury) Safety/Judgment: Appears intact Sensation Sensation Light Touch: Impaired by gross assessment Central sensation comments: Pt able to feel OT wiping buttocks Peripheral sensation comments: Pt with limited distal sensation on RUE and B feet Light Touch Impaired Details: Impaired RUE;Impaired LUE;Impaired RLE;Impaired LLE Hot/Cold: Impaired by gross assessment Proprioception: Impaired by gross assessment Stereognosis: Impaired by gross assessment Coordination Gross Motor Movements are Fluid and Coordinated: No Fine Motor Movements are Fluid and Coordinated: No Motor  Motor Motor: Tetraplegia  Trunk/Postural Assessment  Cervical Assessment Cervical Assessment: Exceptions to WFL Thoracic Assessment Thoracic Assessment: Exceptions to WFL Lumbar Assessment Lumbar Assessment: Exceptions to WFL Postural Control Postural Control: Deficits on evaluation Righting Reactions:  absent/insufficient Protective Responses: absent  Balance Balance Balance Assessed: Yes Static Sitting Balance Static Sitting - Balance Support: Bilateral upper extremity supported Static Sitting - Level of Assistance: 1: +1 Total assist;2: Max assist;3: Mod assist Static Sitting - Comment: Pt initially required total A progressed to mod to max A and back to total A as patient fatigue Extremity Assessment  B UEs: as per OT evaluation RLE Assessment RLE Assessment: Exceptions to WFL Passive Range of Motion (PROM) Comments: WFLs RLE Strength   RLE Overall Strength: Deficits RLE Overall Strength Comments: 0/5 RLE Tone RLE Tone: Flaccid RLE Tone Comments: occasional increased tone noted LLE Assessment LLE Assessment: Exceptions to [facility] Passive Range of Motion (PROM) Comments: WFLs LLE Strength LLE Overall Strength: Deficits LLE Overall Strength Comments: 0/5 LLE Tone LLE Tone: Flaccid LLE Tone Comments: occasional increased tone noted  Care Tool Care Tool Bed Mobility Roll left and right activity   Roll left and right assist level: Dependent - Patient 0%    Sit to lying activity   Sit to lying assist level: Dependent - Patient 0%    Lying to sitting edge of bed activity   Lying to sitting edge of bed assist level: Dependent - Patient 0%     Care Tool Transfers Sit to stand transfer Sit to stand activity did not occur: Safety/medical concerns Sit to stand assist level: Dependent - Patient 0%    Chair/bed transfer Chair/bed transfer activity did not occur: Safety/medical concerns       Toilet transfer Toilet transfer activity did not occur: Safety/medical concerns      Scientist, product/process development transfer activity did not occur: Safety/medical concerns        Care Tool Locomotion Ambulation Ambulation activity did not occur: N/A        Walk 10 feet activity Walk 10 feet activity did not occur: N/A       Walk 50 feet with 2 turns activity Walk 50 feet with 2 turns activity  did not occur: N/A      Walk 150 feet activity Walk 150 feet activity did not occur: N/A      Walk 10 feet on uneven surfaces activity Walk 10 feet on uneven surfaces activity did not occur: N/A      Stairs Stair activity did not occur: N/A        Walk up/down 1 step activity Walk up/down 1 step or curb (drop down) activity did not occur: N/A     Walk up/down 4 steps activity did not occuR: N/A  Walk up/down 4 steps activity      Walk up/down 12 steps activity Walk up/down 12 steps activity did not occur: N/A      Pick up small objects from floor Pick up small object from the floor (from standing position) activity did not occur: N/A      Wheelchair Will patient use wheelchair at discharge?: Yes Type of Wheelchair: Power Wheelchair activity did not occur: Safety/medical concerns      Wheel 50 feet with 2 turns activity Wheelchair 50 feet with 2 turns activity did not occur: Safety/medical concerns    Wheel 150 feet activity Wheelchair 150 feet activity did not occur: Safety/medical concerns      Refer to Care Plan for Long Term Goals  SHORT TERM GOAL WEEK 1 PT Short Term Goal 1 (Week 1): Pt will begin to instruct caregiver/staff in bed mobility and transfers. PT Short Term Goal 2 (Week 1): Pt will increase sitting tolerance on edge of bed to 30 minutes assist with ADL/mobility PT Short Term Goal 3 (Week 1): Pt will increase sitting balance on edge of bed to mod to max A to assist with ADLs/mobility PT Short Term Goal 4 (Week 1): Pt will propel power w/c on level surfaces with S and verbal cues.  Recommendations for other services: Neuropsych  Skilled Therapeutic Intervention PT evaluation completed and treatment plan initiated. Performed LE ROM exercises including heel slides, hip abd/add and SAQs. Pt tolerated edge of bed  x 15 minutes. Pt initially required total A progressing mod to max A then back to total A as patient fatigued. Obtained soft touch call bell and  placed it under patients head to allow patient to call for assistance. Pt educated on PT POC and interventions. Pt verbalized understanding.   Discharge Criteria: Patient will be discharged from PT if patient refuses treatment 3 consecutive times without medical reason, if treatment goals not met, if there is a change in medical status, if patient makes no progress towards goals or if patient is discharged from hospital.  The above assessment, treatment plan, treatment alternatives and goals were discussed and mutually agreed upon: by patient  Mitchell, James G 06/24/2020, 2:41 PM   

## 2020-06-24 NOTE — Progress Notes (Addendum)
Inpatient Rehabilitation Medication Review by a Pharmacist  A complete drug regimen review was completed for this patient to identify any potential clinically significant medication issues.  Clinically significant medication issues were identified:  yes   Type of Medication Issue Identified Description of Issue Urgent (address now) Non-Urgent (address on AM team rounds) Plan Plan Accepted by Provider? (Yes / No / Pending AM Rounds)  Drug Interaction(s) (clinically significant)       Duplicate Therapy  Trazodone 50 mg daily and trazodone 25-50 mg prn sleep ordered; if prn needs to be kept, need to switch to per tube  Non-urgent Contacted Dr. Ranell Patrick via secure chat to determine if prn trazodone is needed PRN trazodone is not needed and has been discontinued.   Allergy       No Medication Administration End Date       Incorrect Dose       Additional Drug Therapy Needed  Unclear if simvastatin 40 mg daily, hydrochlorothiazide 25 mg daily, and aspirin 81 mg daily were patient's PTA meds from outside hospital or continued at discharge.  Non-urgent Contacted Dr. Ranell Patrick via secure chat to determine if these need to be restarted this admission.  These medications are not needed at this time per MD.   Other  Multiple PRN meds ordered PO from admission order set but patient taking meds per tube Non-urgent PO meds have been switched to per tube as appropriate Resolved    Name of provider notified for urgent issues identified: Dr. Ranell Patrick  Provider Method of Notification: Secure chat  Pharmacist comments: All issues have been resolved.   Time spent performing this drug regimen review (minutes):  [address] 06/24/2020 7:28 AM

## 2020-06-24 NOTE — Evaluation (Signed)
Speech Language Pathology Assessment and Plan  Patient Details  Name: Ryan Day MRN: [id number] Date of Birth: 12/20/1950  SLP Diagnosis: Voice disorder;Dysphagia  Rehab Potential: Good ELOS: 4 weeks    Today's Date: 06/24/2020 SLP Individual Time: 0805-0900 SLP Individual Time Calculation (min): 60 min   Hospital Problem: Principal Problem:   Spinal cord injury, cervical region, sequela ([place]) Active Problems:   Pressure injury of skin  Past Medical History:  Past Medical History:  Diagnosis Date  . Acute on chronic respiratory failure with hypoxia ([place])   . Acute renal injury due to hypovolemia ([place])   . Autonomic instability   . Cardiac arrest ([place])   . Cervical spinal cord injury, sequela ([place])   . Elevated alkaline phosphatase level   . 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 Hyperlipidemia   . Hypertension    Past Surgical History:  Past Surgical History:  Procedure Laterality Date  . COLONOSCOPY WITH PROPOFOL N/A 12/05/2017   Procedure: COLONOSCOPY WITH PROPOFOL;  Surgeon: Ryan Landsman, MD;  Location: [facility] ENDOSCOPY;  Service: Gastroenterology;  Laterality: N/A;  . ESOPHAGOGASTRODUODENOSCOPY  12/05/2017   Procedure: ESOPHAGOGASTRODUODENOSCOPY (EGD);  Surgeon: Ryan Landsman, MD;  Location: [facility] ENDOSCOPY;  Service: Gastroenterology;;    Assessment / Plan / Recommendation Clinical Impression Ryan Day is a 69 year old male with history of HTN otherwise in relatively good health who was admitted to [facility] on 04/26/2020 after fall, striking his head and face with subsequent qualdraplegia. He was found to have C5 vertebral body extension type teardrop fracture, C3-C6 cord compression with edema and was taken to OR for C3-C6 laminectomy with fusion and repair of small dural tear X 2 by Dr. Macario Day on the same day. Post op course significant for spinal shock with hypotension and bradycardia, post procedural PTX, C-diff colitis, significant secretions  requiring multiple bronchoscopies due to mucous plugging and ultimately required PEG/Trach on 05/04/20. Hospital course further complicated by respiratory distress with PEA and and ROSC achieved after 8 minutes. Aspiration PNA and intermittent fevers treated, AKI/acute on chronic anemia/abnormal LFTs resolving and he was tolerating PMSV trials.   He was discharged to [facility] on 06/07/20 for management of acute on chronic respiratory failure and for therapy. He has had intermittent issues with pre-renal azotemia and hypernatremia--Na-149 today and started on IV dextrose. Secretions have improved with addition of scopolamine patch, he was downsized to CFS#6 and he is tolerating button plugging. He was found to have E-coli UTI at admission and treated with 7 day course of IV rocephin. CXR done 09/17 due to ongoing secretions and showed right infrahilar PNA which was treated with course of doxycycline. He was made NPO as FEES revealed penetration of nectar to vallecula with difficulty clearing residue and eventual aspiration. Multiple wound on sacrum have evolved and being managed with local measures. Per RT- no desaturations or mucous plugging during his stay but pulmonary recommends evaluation on outpatient basis prior to decannulation due to weakness. Therapy has been ongoing with patient showing improvement in activity tolerance and therapy team was recommending intensive rehab program. He has a supportive family who plan on providing assistance after discharge. Pt was admitted to CIR on 10/1.   Pt is currently NPO with capped trach in place. SLP reviewed images and handwritten report of most recent MBS on 9/22. Report noted small amounts of silent aspiration of nectar thick and thin liquids after the swallow due to uncleared residue in pyriform sinuses. Today's BSE indicated no abnormal  findings on oral motor examination, except for weak cough. Pt consumed trials of thin liquids via ice chips, TSP, cup/straw  sips and puree textures. Pt demonstrated appropriate bolus manipulation, mild swallow delay and multiple swallows (2-3) with no overt s/s aspiration. Pt's voice appeared clear throughout the trials and the session. SLP recommends to continue NPO status due to noted silent aspiration, begin water protocol and plan for repeat MBS next week. Pt expressed baseline cognitive function with mild deficits noted in memory, calculations, abstract language and sustained attention given subsections of the Cognistat. Pt presents with moderate dysphonia leading to very low vocal intensity. Pt is intelligible at the phrase/simple sentence level (70% with mod A verbal cues) if the listener is within a very close proximity and the environment is quiet. SLP will target swallow and speech skills. Pt would benefit from skilled ST services in order to maximize functional independence and reduce burden of care, likely requiring supervision at discharge with continued skilled ST services.  Skilled Therapeutic Interventions           Skilled ST services focused on swallow skills. SLP instructed pt in swallow strategies to reduced pharyngeal residue and engaged vocal fold adduction, utilizing effortful swallow and "ah" and "e" sustained phonation/pitch glides. Pt demonstrated ability to sustain "ah" and "e" for 4 seconds and returned demonstration of effortful swallow. SLP instructed pt to utilize effortful swallow during thin liquid trials of ice chips. All questions answered to satisfaction. Pt was left in room with call bell within reach and bed alarm set. SLP recommends to continue skilled services.  SLP Assessment  Patient will need skilled [place] Pathology Services during CIR admission    Recommendations  SLP Diet Recommendations: NPO Recommendations for Other Services: Neuropsych consult Patient destination: Home Follow up Recommendations: Home Health SLP;Outpatient SLP;24 hour supervision/assistance Equipment  Recommended: None recommended by SLP    SLP Frequency 3 to 5 out of 7 days   SLP Duration  SLP Intensity  SLP Treatment/Interventions 4 weeks  Minumum of 1-2 x/day, 30 to 90 minutes  Cueing hierarchy;Dysphagia/aspiration precaution training;Therapeutic Exercise;Patient/family education    Pain Pain Assessment Pain Scale: 0-10 Pain Score: 0-No pain  Prior Functioning Cognitive/Linguistic Baseline: Information not available Type of Home: Mobile home  Lives With: Spouse Available Help at Discharge: Family;Available 24 hours/day Education: 11th  SLP Evaluation Cognition Overall Cognitive Status: No family/caregiver present to determine baseline cognitive functioning (Pt supports cognitive baseline) Arousal/Alertness: Awake/alert Orientation Level: Oriented X4 Attention: Sustained;Selective Sustained Attention: Appears intact Selective Attention: Appears intact Memory: Impaired Memory Impairment: Storage deficit Awareness: Impaired Awareness Impairment: Other (comment) (insight into injury) Safety/Judgment: Appears intact  Comprehension Auditory Comprehension Overall Auditory Comprehension: Appears within functional limits for tasks assessed Commands: Within Functional Limits Conversation: Simple Expression Expression Primary Mode of Expression: Verbal Verbal Expression Overall Verbal Expression: Appears within functional limits for tasks assessed Initiation: No impairment Written Expression Dominant Hand: Right Oral Motor Oral Motor/Sensory Function Overall Oral Motor/Sensory Function: Within functional limits Motor Speech Overall Motor Speech: Impaired Respiration: Within functional limits Phonation: Breathy;Low vocal intensity Resonance: Within functional limits Articulation: Within functional limitis Intelligibility: Intelligibility reduced Word: 75-100% accurate Phrase: 50-74% accurate Sentence: 50-74% accurate Motor Planning: Witnin functional  limits Motor Speech Errors: Not applicable Effective Techniques: Increased vocal intensity (close proxmity)  Care Tool Care Tool Cognition Expression of Ideas and Wants Expression of Ideas and Wants: Without difficulty (complex and basic) - expresses complex messages without difficulty and with speech that is clear and easy to understand  Understanding Verbal and Non-Verbal Content Understanding Verbal and Non-Verbal Content: Understands (complex and basic) - clear comprehension without cues or repetitions   Memory/Recall Ability *first 3 days only       PMSV Assessment  PMSV Trial Intelligibility: Intelligibility reduced Word: 75-100% accurate Phrase: 50-74% accurate Sentence: 50-74% accurate  Bedside Swallowing Assessment General Previous Swallow Assessment: 9/22 silent aspiration after the swallow with thin and nectar thick liquids Diet Prior to this Study: NPO Respiratory Status: Room air Trach Size and Type: Uncuffed;#6 Behavior/Cognition: Alert;Cooperative;Pleasant mood Oral Cavity - Dentition: Adequate natural dentition Self-Feeding Abilities: Total assist Patient Positioning: Upright in bed Baseline Vocal Quality: Breathy;Low vocal intensity Volitional Cough: Weak Volitional Swallow: Able to elicit  Oral Care Assessment Does patient have any of the following "high(er) risk" factors?: Tracheostomy with trach collar 24 hrs./day Patient is HIGH RISK: Non-ventilated: Order set for Adult Oral Care Protocol initiated - "High Risk Patients - Non-Ventilated" option selected  (see row information) Ice Chips Ice chips: Impaired Presentation: Cup Pharyngeal Phase Impairments: Suspected delayed Swallow;Multiple swallows Thin Liquid Thin Liquid: Impaired Presentation: Cup;Spoon;Straw Pharyngeal  Phase Impairments: Suspected delayed Swallow;Multiple swallows Nectar Thick Nectar Thick Liquid: Not tested Honey Thick Honey Thick Liquid: Not tested Puree Puree:  Impaired Presentation: Spoon Pharyngeal Phase Impairments: Multiple swallows Solid Solid: Not tested BSE Assessment Risk for Aspiration Impact on safety and function: Moderate aspiration risk Other Related Risk Factors: Tracheostomy;Deconditioning;Decreased respiratory status  Short Term Goals: Week 1: SLP Short Term Goal 1 (Week 1): Pt will consume trials of thin liquids (ice chips) with vital signs remaining WFL prior to repeat instrumental assessment. SLP Short Term Goal 2 (Week 1): Pt will participate in instrumental swallow assessment. SLP Short Term Goal 3 (Week 1): Pt will perform pharyngeal strength and RMT exercises to improve vocal intensity and swallow function. SLP Short Term Goal 4 (Week 1): Pt will increase vocal intensity at the phrase level to 70% intelligibility with min A verbal cues to clarify communication.  Refer to Care Plan for Long Term Goals  Recommendations for other services: Neuropsych  Discharge Criteria: Patient will be discharged from SLP if patient refuses treatment 3 consecutive times without medical reason, if treatment goals not met, if there is a change in medical status, if patient makes no progress towards goals or if patient is discharged from hospital.  The above assessment, treatment plan, treatment alternatives and goals were discussed and mutually agreed upon: by patient  Tajh Livsey  [facility] 06/24/2020, 4:54 PM

## 2020-06-24 NOTE — Progress Notes (Signed)
[place] PHYSICAL MEDICINE & REHABILITATION PROGRESS NOTE   Subjective/Complaints: No complaints.  Labs stable today Asked nurse for soft call bell. Denies pain  ROS:  Denies pain, constipation, insomnia.    Objective:   No results found. Recent Labs    06/23/20 0725 06/24/20 0623  WBC 7.0 7.2  HGB 10.8* 10.6*  HCT 33.4* 32.9*  PLT 168 163   Recent Labs    06/23/20 0725 06/24/20 0623  NA 146* 144  K 3.6 3.5  CL 106 105  CO2 29 27  GLUCOSE 100* 118*  BUN 22 27*  CREATININE 0.62 0.72  CALCIUM 10.1 10.0    Intake/Output Summary (Last 24 hours) at 06/24/2020 1644 Last data filed at 06/24/2020 1500 Gross per 24 hour  Intake 1612.5 ml  Output 1800 ml  Net -187.5 ml     Pressure Injury 06/23/20 Coccyx Medial Stage 2 -  Partial thickness loss of dermis presenting as a shallow open injury with a red, pink wound bed without slough. (Active)  06/23/20 1710  Location: Coccyx  Location Orientation: Medial  Staging: Stage 2 -  Partial thickness loss of dermis presenting as a shallow open injury with a red, pink wound bed without slough.  Wound Description (Comments):   Present on Admission: Yes     Pressure Injury 06/23/20 Buttocks Right Stage 2 -  Partial thickness loss of dermis presenting as a shallow open injury with a red, pink wound bed without slough. (Active)  06/23/20 1838  Location: Buttocks  Location Orientation: Right  Staging: Stage 2 -  Partial thickness loss of dermis presenting as a shallow open injury with a red, pink wound bed without slough.  Wound Description (Comments):   Present on Admission: Yes     Pressure Injury 06/23/20 Left Stage 2 -  Partial thickness loss of dermis presenting as a shallow open injury with a red, pink wound bed without slough. (Active)  06/23/20 1839  Location:   Location Orientation: Left  Staging: Stage 2 -  Partial thickness loss of dermis presenting as a shallow open injury with a red, pink wound bed without  slough.  Wound Description (Comments):   Present on Admission: Yes    Physical Exam: Vital Signs Blood pressure 113/75, pulse 62, temperature (!) 97.5 F (36.4 C), resp. rate 20, height 6\' 5"  (1.956 m), weight 105.2 kg, SpO2 97 %. General: Alert and oriented x 3, No apparent distress HEENT: Sitting supported in bed--NAD. Trach button plugged and no issues with secretions during exam.  Neck: +Capped trach Heart: Reg rate and rhythm. No murmurs rubs or gallops Chest: CTA bilaterally without wheezes, rales, or rhonchi; no distress Abdomen: There is distension.     Comments: +PEG  Extremities: No clubbing, cyanosis, or edema. Pulses are 2+ Skin: stage 2 pressure injuries on buttock and coccyx Neuro: Dysphonia (wife reports close to baseline).  Motor: B/l UE shoulder abduction 1+/5, distally 0/5 with ?trace flicker left wrist flexor B/l LE: 0/5 proximal to distal Sensation diminished to light touch in all extremities  Psych: Pt's affect is appropriate. Pt is cooperative    Assessment/Plan: 1. 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  Physiatrist is providing close team supervision and 24 hour management of active medical problems listed below.  Physiatrist and rehab team continue to assess barriers to discharge/monitor patient progress toward functional and medical goals  Care Tool:  Bathing  Bathing assist Assist Level: 2 Helpers     Upper Body Dressing/Undressing Upper body dressing        Upper body assist Assist Level: 2 Helpers    Lower Body Dressing/Undressing Lower body dressing            Lower body assist Assist for lower body dressing: 2 Helpers     Toileting Toileting    Toileting assist Assist for toileting: Dependent - Patient 0%     Transfers Chair/bed transfer  Transfers assist  Chair/bed transfer activity did not  occur: Safety/medical concerns        Locomotion Ambulation   Ambulation assist   Ambulation activity did not occur: N/A          Walk 10 feet activity   Assist  Walk 10 feet activity did not occur: N/A        Walk 50 feet activity   Assist Walk 50 feet with 2 turns activity did not occur: N/A         Walk 150 feet activity   Assist Walk 150 feet activity did not occur: N/A         Walk 10 feet on uneven surface  activity   Assist Walk 10 feet on uneven surfaces activity did not occur: N/A         Wheelchair     Assist Will patient use wheelchair at discharge?: Yes Type of Wheelchair: Power Wheelchair activity did not occur: Safety/medical concerns         Wheelchair 50 feet with 2 turns activity    Assist    Wheelchair 50 feet with 2 turns activity did not occur: Safety/medical concerns       Wheelchair 150 feet activity     Assist  Wheelchair 150 feet activity did not occur: Safety/medical concerns       Blood pressure 113/75, pulse 62, temperature (!) 97.5 F (36.4 C), resp. rate 20, height 6\' 5"  (1.956 m), weight 105.2 kg, SpO2 97 %.    Medical Problem List and Plan: 1.  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  Antithrombotics: -DVT/anticoagulation:  Pharmaceutical: Lovenox--check dopplers in am.              -antiplatelet therapy: N/A 3. Pain Management: N/A 4. Mood: LCSW to follow for evaluation and support.              -antipsychotic agents: N/A 5. Neuropsych: This patient is capable of making decisions on his own behalf. 6. Sacral decub/Skin/Wound Care: Pictures show wound in different stages of healing. Ordered Air mattress overlay. Continue vitamin and prostat to promote healing. Will add Juven also for collagen. Will increase tube feed to 50 cc/hr as likely has higher  nutritional need. Will consult dietician to help address nutritional needs.  7. Fluids/Electrolytes/Nutrition: Monitor I/Os.CMP ordered. Transition to bolus tube feeds next week if stable.  8. Hypotension: Secondary to autonomic dysfunction has resolved.              Monitor with increased activity, particularly for orthostasis.   10/2 well controlled.  9. Acute on chronic anemia: Improving--continue to monitor. Added iron supplement.  CBC stable 10/2 10. Hypernatremia/Hyperglycemia: Due to intermittent IV dextrose. Added water flushes. May need low sodium formula.  11. Neurogenic bladder: Maintain foley due to sacral wounds.. 12. Neurogenic bowel:Continue senna in am with suppository in evenings for bowel program. Moving bowels regularly  LOS: 1 days A FACE TO FACE EVALUATION WAS PERFORMED  Martha Clan P Jonnie Kubly 06/24/2020, 4:44 PM

## 2020-06-24 NOTE — Plan of Care (Signed)
  Problem: RH SKIN INTEGRITY Goal: RH STG SKIN FREE OF INFECTION/BREAKDOWN Outcome: Progressing   Problem: RH SAFETY Goal: RH STG ADHERE TO SAFETY PRECAUTIONS W/ASSISTANCE/DEVICE Description: STG Adhere to Safety Precautions With Assistance/Device. Outcome: Progressing

## 2020-06-24 NOTE — Progress Notes (Signed)
Removed existing foley catheter and changed to a new one per md order. Foley catheter Fr 16 inserted by Roselyn Reef, RN with Junious Dresser, RN at 2200.

## 2020-06-25 ENCOUNTER — Inpatient Hospital Stay (HOSPITAL_COMMUNITY): Payer: BC Managed Care – PPO

## 2020-06-25 DIAGNOSIS — S14109S Unspecified injury at unspecified level of cervical spinal cord, sequela: Secondary | ICD-10-CM

## 2020-06-25 LAB — GLUCOSE, CAPILLARY
Glucose-Capillary: 124 mg/dL — ABNORMAL HIGH (ref 70–99)
Glucose-Capillary: 125 mg/dL — ABNORMAL HIGH (ref 70–99)
Glucose-Capillary: 135 mg/dL — ABNORMAL HIGH (ref 70–99)
Glucose-Capillary: 139 mg/dL — ABNORMAL HIGH (ref 70–99)
Glucose-Capillary: 143 mg/dL — ABNORMAL HIGH (ref 70–99)
Glucose-Capillary: 144 mg/dL — ABNORMAL HIGH (ref 70–99)

## 2020-06-25 NOTE — Plan of Care (Signed)
  Problem: SCI BOWEL ELIMINATION Goal: RH STG MANAGE BOWEL WITH ASSISTANCE Description: STG Manage Bowel with Assistance. Outcome: Progressing Goal: RH STG SCI MANAGE BOWEL WITH MEDICATION WITH ASSISTANCE Description: STG SCI Manage bowel with medication with assistance. Outcome: Progressing Goal: RH STG MANAGE BOWEL W/EQUIPMENT W/ASSISTANCE Description: STG Manage Bowel With Equipment With Assistance Outcome: Progressing Goal: RH STG SCI MANAGE BOWEL PROGRAM W/ASSIST OR AS APPROPRIATE Description: STG SCI Manage bowel program w/assist or as appropriate. Outcome: Progressing Goal: RH OTHER STG BOWEL ELIMINATION GOALS W/ASSIST Description: Other STG Bowel Elimination Goals With Assistance. Outcome: Progressing   Problem: RH SKIN INTEGRITY Goal: RH STG SKIN FREE OF INFECTION/BREAKDOWN Outcome: Progressing

## 2020-06-25 NOTE — Progress Notes (Signed)
Lower extremity venous bilateral study completed.   Please see CV Proc for preliminary results.   Melanie Pellot, RDMS  

## 2020-06-25 NOTE — Progress Notes (Signed)
[facility] PHYSICAL MEDICINE & REHABILITATION PROGRESS NOTE   Subjective/Complaints: No complaints this morning. Denies pain, constipation, insomnia.   Received soft call bell- I placed behind his left shoulder. Wife at bedside has no concerns  ROS:  Denies pain, constipation, insomnia.    Objective:   VAS [place] LOWER EXTREMITY VENOUS (DVT)  Result Date: 06/25/2020  Lower Venous DVTStudy Indications: Immobility- quadriplegia.  Comparison Study: No prior studies. Performing Technologist: Darlin Coco  Examination Guidelines: A complete evaluation includes B-mode imaging, spectral Doppler, color Doppler, and power Doppler as needed of all accessible portions of each vessel. Bilateral testing is considered an integral part of a complete examination. Limited examinations for reoccurring indications may be performed as noted. The reflux portion of the exam is performed with the patient in reverse Trendelenburg.  +---------+---------------+---------+-----------+----------+--------------+ RIGHT    CompressibilityPhasicitySpontaneityPropertiesThrombus Aging +---------+---------------+---------+-----------+----------+--------------+ CFV      Full           Yes      Yes                                 +---------+---------------+---------+-----------+----------+--------------+ SFJ      Full                                                        +---------+---------------+---------+-----------+----------+--------------+ FV Prox  Full                                                        +---------+---------------+---------+-----------+----------+--------------+ FV Mid   Full                                                        +---------+---------------+---------+-----------+----------+--------------+ FV DistalFull                                                        +---------+---------------+---------+-----------+----------+--------------+ PFV      Full                                                         +---------+---------------+---------+-----------+----------+--------------+ POP      Full           Yes      Yes                                 +---------+---------------+---------+-----------+----------+--------------+ PTV      Full                                                        +---------+---------------+---------+-----------+----------+--------------+  PERO     Full                                                        +---------+---------------+---------+-----------+----------+--------------+   +---------+---------------+---------+-----------+----------+--------------+ LEFT     CompressibilityPhasicitySpontaneityPropertiesThrombus Aging +---------+---------------+---------+-----------+----------+--------------+ CFV      Full           Yes      Yes                                 +---------+---------------+---------+-----------+----------+--------------+ SFJ      Full                                                        +---------+---------------+---------+-----------+----------+--------------+ FV Prox  Full                                                        +---------+---------------+---------+-----------+----------+--------------+ FV Mid   Full                                                        +---------+---------------+---------+-----------+----------+--------------+ FV DistalFull                                                        +---------+---------------+---------+-----------+----------+--------------+ PFV      Full           Yes      Yes                                 +---------+---------------+---------+-----------+----------+--------------+ POP      Full           Yes      Yes                                 +---------+---------------+---------+-----------+----------+--------------+ PTV      Full                                                         +---------+---------------+---------+-----------+----------+--------------+ PERO     Full                                                        +---------+---------------+---------+-----------+----------+--------------+  Summary: RIGHT: - There is no evidence of deep vein thrombosis in the lower extremity.  - No cystic structure found in the popliteal fossa.  LEFT: - There is no evidence of deep vein thrombosis in the lower extremity.  - No cystic structure found in the popliteal fossa.  *See table(s) above for measurements and observations.    Preliminary    Recent Labs    06/23/20 0725 06/24/20 0623  WBC 7.0 7.2  HGB 10.8* 10.6*  HCT 33.4* 32.9*  PLT 168 163   Recent Labs    06/23/20 0725 06/24/20 0623  NA 146* 144  K 3.6 3.5  CL 106 105  CO2 29 27  GLUCOSE 100* 118*  BUN 22 27*  CREATININE 0.62 0.72  CALCIUM 10.1 10.0    Intake/Output Summary (Last 24 hours) at 06/25/2020 1428 Last data filed at 06/25/2020 1007 Gross per 24 hour  Intake 1612.5 ml  Output 2075 ml  Net -462.5 ml     Pressure Injury 06/23/20 Coccyx Medial Stage 2 -  Partial thickness loss of dermis presenting as a shallow open injury with a red, pink wound bed without slough. (Active)  06/23/20 1710  Location: Coccyx  Location Orientation: Medial  Staging: Stage 2 -  Partial thickness loss of dermis presenting as a shallow open injury with a red, pink wound bed without slough.  Wound Description (Comments):   Present on Admission: Yes     Pressure Injury 06/23/20 Buttocks Right Stage 2 -  Partial thickness loss of dermis presenting as a shallow open injury with a red, pink wound bed without slough. (Active)  06/23/20 1838  Location: Buttocks  Location Orientation: Right  Staging: Stage 2 -  Partial thickness loss of dermis presenting as a shallow open injury with a red, pink wound bed without slough.  Wound Description (Comments):   Present on Admission: Yes     Pressure Injury 06/23/20  Left Stage 2 -  Partial thickness loss of dermis presenting as a shallow open injury with a red, pink wound bed without slough. (Active)  06/23/20 1839  Location:   Location Orientation: Left  Staging: Stage 2 -  Partial thickness loss of dermis presenting as a shallow open injury with a red, pink wound bed without slough.  Wound Description (Comments):   Present on Admission: Yes    Physical Exam: Vital Signs Blood pressure 129/76, pulse (!) 58, temperature 98.2 F (36.8 C), resp. rate 18, height 6\' 5"  (1.956 m), weight 105.2 kg, SpO2 98 %. General: Alert and oriented x 3, No apparent distress HEENT: Sitting supported in bed--NAD. Trach button plugged and no issues with secretions during exam.  Neck: +Capped trach Heart: Reg rate and rhythm. No murmurs rubs or gallops Chest: CTA bilaterally without wheezes, rales, or rhonchi; no distress Abdomen: There is distension.     Comments: +PEG  Extremities: No clubbing, cyanosis, or edema. Pulses are 2+ Skin: stage 2 pressure injuries on buttock and coccyx Neuro: Dysphonia (wife reports close to baseline).  Motor: B/l UE shoulder abduction 1+/5, distally 0/5 with ?trace flicker left wrist flexor B/l LE: 0/5 proximal to distal Sensation diminished to light touch in all extremities Psych: Pt's affect is appropriate. Pt is cooperative     Assessment/Plan: 1. 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  Physiatrist is providing close team supervision and 24 hour management of active medical problems listed below.  Physiatrist and  rehab team continue to assess barriers to discharge/monitor patient progress toward functional and medical goals  Care Tool:  Bathing              Bathing assist Assist Level: 2 Helpers     Upper Body Dressing/Undressing Upper body dressing   What is the patient wearing?: Pull over shirt     Upper body assist Assist Level: 2 Helpers    Lower Body Dressing/Undressing Lower body dressing      What is the patient wearing?: Incontinence brief     Lower body assist Assist for lower body dressing: 2 Helpers     Toileting Toileting    Toileting assist Assist for toileting: Dependent - Patient 0% (foley)     Transfers Chair/bed transfer  Transfers assist  Chair/bed transfer activity did not occur: Safety/medical concerns        Locomotion Ambulation   Ambulation assist   Ambulation activity did not occur: N/A          Walk 10 feet activity   Assist  Walk 10 feet activity did not occur: N/A        Walk 50 feet activity   Assist Walk 50 feet with 2 turns activity did not occur: N/A         Walk 150 feet activity   Assist Walk 150 feet activity did not occur: N/A         Walk 10 feet on uneven surface  activity   Assist Walk 10 feet on uneven surfaces activity did not occur: N/A         Wheelchair     Assist Will patient use wheelchair at discharge?: Yes Type of Wheelchair: Power Wheelchair activity did not occur: Safety/medical concerns         Wheelchair 50 feet with 2 turns activity    Assist    Wheelchair 50 feet with 2 turns activity did not occur: Safety/medical concerns       Wheelchair 150 feet activity     Assist  Wheelchair 150 feet activity did not occur: Safety/medical concerns       Blood pressure 129/76, pulse (!) 58, temperature 98.2 F (36.8 C), resp. rate 18, height 6\' 5"  (1.956 m), weight 105.2 kg, SpO2 98 %.    Medical Problem List and Plan: 1.  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  Antithrombotics: -DVT/anticoagulation:  Pharmaceutical: Lovenox--check dopplers in am.              -antiplatelet therapy: N/A 3. Pain Management: N/A 4. Mood: LCSW to  follow for evaluation and support.              -antipsychotic agents: N/A 5. Neuropsych: This patient is capable of making decisions on his own behalf. 6. Sacral decub/Skin/Wound Care: Pictures show wound in different stages of healing. Ordered Air mattress overlay. Continue vitamin and prostat to promote healing. Will add Juven also for collagen. Will increase tube feed to 50 cc/hr as likely has higher nutritional need. Will consult dietician to help address nutritional needs.  7. Fluids/Electrolytes/Nutrition: Monitor I/Os.CMP ordered. Transition to bolus tube feeds next week if stable.  8. Hypotension: Secondary to autonomic dysfunction has resolved.  Monitor with increased activity, particularly for orthostasis.   10/3 well controlled  9. Acute on chronic anemia: Improving--continue to monitor. Added iron supplement.              CBC stable 10/2 10. Hypernatremia/Hyperglycemia: Due to intermittent IV dextrose. Added water flushes. May need low sodium formula.  11. Neurogenic bladder: Maintain foley due to sacral wounds.. 12. Neurogenic bowel:Continue senna in am with suppository in evenings for bowel program. Moving bowels regularly   10/3: Patient states he is moving his bowels regularly.  13. Secretions: continue scopolamine patch  LOS: 2 days A FACE TO FACE EVALUATION WAS PERFORMED  Martha Clan P Lyndsee Casa 06/25/2020, 2:28 PM

## 2020-06-26 ENCOUNTER — Inpatient Hospital Stay (HOSPITAL_COMMUNITY): Payer: BC Managed Care – PPO | Admitting: Physical Therapy

## 2020-06-26 ENCOUNTER — Inpatient Hospital Stay (HOSPITAL_COMMUNITY): Payer: BC Managed Care – PPO

## 2020-06-26 ENCOUNTER — Inpatient Hospital Stay (HOSPITAL_COMMUNITY): Payer: BC Managed Care – PPO | Admitting: Speech Pathology

## 2020-06-26 LAB — BASIC METABOLIC PANEL
Anion gap: 11 (ref 5–15)
BUN: 54 mg/dL — ABNORMAL HIGH (ref 8–23)
CO2: 25 mmol/L (ref 22–32)
Calcium: 9.7 mg/dL (ref 8.9–10.3)
Chloride: 108 mmol/L (ref 98–111)
Creatinine, Ser: 1.19 mg/dL (ref 0.61–1.24)
GFR calc Af Amer: >60 mL/min (ref >60)
GFR calc non Af Amer: >60 mL/min (ref >60)
Glucose, Bld: 115 mg/dL — ABNORMAL HIGH (ref 70–99)
Potassium: 3.9 mmol/L (ref 3.5–5.1)
Sodium: 144 mmol/L (ref 135–145)

## 2020-06-26 LAB — GLUCOSE, CAPILLARY
Glucose-Capillary: 140 mg/dL — ABNORMAL HIGH (ref 70–99)
Glucose-Capillary: 147 mg/dL — ABNORMAL HIGH (ref 70–99)
Glucose-Capillary: 148 mg/dL — ABNORMAL HIGH (ref 70–99)
Glucose-Capillary: 160 mg/dL — ABNORMAL HIGH (ref 70–99)
Glucose-Capillary: 163 mg/dL — ABNORMAL HIGH (ref 70–99)
Glucose-Capillary: 175 mg/dL — ABNORMAL HIGH (ref 70–99)

## 2020-06-26 NOTE — Progress Notes (Signed)
Dig stim  Performed, small stool came out. Performed another dig stimulation after 15 minutes.Good result of bowel movement.

## 2020-06-26 NOTE — Progress Notes (Addendum)
Digital stimulation performed at 730 pm and repeated after 15 minutes. Small amount of stool came out. Patient also asking for ice chips. Oral care performed and patient tolerated ice chips well. No coughing observed.

## 2020-06-26 NOTE — Progress Notes (Addendum)
Physical Therapy Session Note  Patient Details  Name: Ryan Day MRN: [id number] Date of Birth: Mar 11, 1951  Today's Date: 06/26/2020 PT Individual Time: 1000-1100 PT Individual Time Calculation (min): 60 min   Short Term Goals: Week 1:  PT Short Term Goal 1 (Week 1): Pt will begin to instruct caregiver/staff in bed mobility and transfers. PT Short Term Goal 2 (Week 1): Pt will increase sitting tolerance on edge of bed to 30 minutes assist with ADL/mobility PT Short Term Goal 3 (Week 1): Pt will increase sitting balance on edge of bed to mod to max A to assist with ADLs/mobility PT Short Term Goal 4 (Week 1): Pt will propel power w/c on level surfaces with S and verbal cues.  Skilled Therapeutic Interventions/Progress Updates:    PAIN denies pain  Pt initially supine and agreeable to session.  Wife initially in room w/pt but left at beginning of session.  Discussed possibility of bringing pt phone to allow use of voice control features for training w/ calling.    Pt wearing thigh high Ted Hose and ccollar.  Discussed Maxisky lift and pt agreed.  Harness obtained by therapist.   Therapist also obtained Roho cushion for TIS wc for pressure relief. Pt turns head L/R for assist w/rolling, 2 person assist to complete and for placement of harness.  Verbalized steps to patient. Pt transferred oob to wc via lift.  Supine  119/72   HR 78 Sitting    84/52    HR 80 Tilted/legs elevated    90/58  HR 78  Pt c/o mild dizzyness w/positional changes.  Would benefit from addition of abd binder, discussed w/PA/orders received.  In sitting/tilted w/legs elevated, performed bilat gastroc and HS stretching Discussed assisted cough technique and repeated 6-8 times while pt used suction to clear secretions Pt did not have incentive spiromenter in room, obtained and instructed w/use Discussed importance of strengthening diaphragm, lack of intercostal and abdominal strength w/C3-4 level injury. Discussed  pressure relief, purpose and importance, schedule.  Pt then taken out of room and oriented to rehab center, main gym and day room treatment areas.   Pt then transferred back to bed via mechanical lift as above.  Repositioned comfortably in bed.  Pt left supine w/rails up x 3, alarm set, bed in lowest position, and needs in reach.  Pt tolerated OOB in wc.  Anticipate extensive care needs at dc.  Wife will need signficant education and training.   Therapy Documentation Precautions:  Precautions Precautions: Cervical Precaution Booklet Issued: No Precaution Comments: C collar on when egde of bed, upright or OOB Required Braces or Orthoses: Cervical Brace Cervical Brace: Hard collar, Other (comment) (C collar when edge of bed, upright or OOB) Restrictions Weight Bearing Restrictions: No    Therapy/Group: Individual Therapy  Callie Fielding, PT   Jerrilyn Cairo 06/26/2020, 12:51 PM

## 2020-06-26 NOTE — Progress Notes (Signed)
Physical Therapy Session Note  Patient Details  Name: Ryan Day MRN: [id number] Date of Birth: [birth year]  Today's Date: 06/26/2020 PT Individual Time: 1530-1615 PT Individual Time Calculation (min): 45 min   Short Term Goals: Week 1:  PT Short Term Goal 1 (Week 1): Pt will begin to instruct caregiver/staff in bed mobility and transfers. PT Short Term Goal 2 (Week 1): Pt will increase sitting tolerance on edge of bed to 30 minutes assist with ADL/mobility PT Short Term Goal 3 (Week 1): Pt will increase sitting balance on edge of bed to mod to max A to assist with ADLs/mobility PT Short Term Goal 4 (Week 1): Pt will propel power w/c on level surfaces with S and verbal cues.  Skilled Therapeutic Interventions/Progress Updates:    Pt received seated in bed, agreeable to PT session. No complaints of pain but reports feeling dizzy. Seated BP 111/69 while pt wearing thigh high TED hose. Pt requesting his BG be checked, nursing in room to assess and 163. Nursing to administer insulin after therapy session. Semi-reclined in bed to sitting EOB with assist x 2 for trunk control and BLE management. Pt is max A to maintain sitting balance EOB. Pt reports increase in dizziness while seated EOB. Seated BP 77/51. Returned pt to supine with assist x 2 for trunk control and LE management. Semi-reclined BP 111/67 and dizziness subsides in this position. Education with patient regarding BP management and BP changes with regards to SCI. Discussed use of abdominal binder and ACE wrap for further management. Also discussed pressure relief as pt current has sacral skin breakdown. Pt agreeable to lay on his side on bed to get off of his bottom. Pt is assist x 2 for rolling and repositioning in bed. Pt left in R sidelying in bed with needs in reach at end of session.  Therapy Documentation Precautions:  Precautions Precautions: Cervical Precaution Booklet Issued: No Precaution Comments: C collar on when egde of bed,  upright or OOB Required Braces or Orthoses: Cervical Brace Cervical Brace: Hard collar, Other (comment) (C collar when edge of bed, upright or OOB) Restrictions Weight Bearing Restrictions: No   Therapy/Group: Individual Therapy   Excell Seltzer, PT, DPT  06/26/2020, 5:21 PM

## 2020-06-26 NOTE — Progress Notes (Signed)
Occupational Therapy Session Note  Patient Details  Name: Ryan Day MRN: [id number] Date of Birth: [birth year]  Today's Date: 06/26/2020 OT Individual Time: 5859-2924 OT Individual Time Calculation (min): 45 min    Short Term Goals: Week 1:  OT Short Term Goal 1 (Week 1): Pt will maintain static sitting balnace wiht MAX A of 1 caregiver for 3 min at EOB/EOM OT Short Term Goal 2 (Week 1): Pt will direct functional transfers with max VC OT Short Term Goal 3 (Week 1): Pt will roll B in bed with MOD +2 A  Skilled Therapeutic Interventions/Progress Updates:    Pt received supine resting, no c/o pain. No +2 assistance available so pt agreeable to bed level session. 1200 cc emptied from foley catheter- NT notified. Began with full body PROM to all extremities, elbow, hips, shoulder, B feet, and wrist to reduce contracture risk and maintain muscle length. Pt with no c/o pain throughout. Pt was rolled R and L dependently to ensure brief was clean and to don pants in prep for OOB activity with PT. Pt was scooted up in bed to ensure proper upright posture for lung clearance/expansion- pt reporting less secretions today and had none during session. Coordinated care with PT and NT re bed potentially needing to be expanded to accomodate pt height. Thigh high teds were donned dependently for BP control OOB. Pt was left supine with all needs met, soft call bell placed behind shoulder and pt practiced activating it x2 to ensure accessibility.   Therapy Documentation Precautions:  Precautions Precautions: Cervical Precaution Booklet Issued: No Precaution Comments: C collar on when egde of bed, upright or OOB Required Braces or Orthoses: Cervical Brace Cervical Brace: Hard collar, Other (comment) (C collar when edge of bed, upright or OOB) Restrictions Weight Bearing Restrictions: No  Therapy/Group: Individual Therapy  Curtis Sites 06/26/2020, 6:57 AM

## 2020-06-26 NOTE — Progress Notes (Signed)
Speech Language Pathology Daily Session Note  Patient Details  Name: Ryan Day MRN: [id number] Date of Birth: 1951-01-07  Today's Date: 06/26/2020 SLP Individual Time: 7793-9030 SLP Individual Time Calculation (min): 55 min  Short Term Goals: Week 1: SLP Short Term Goal 1 (Week 1): Pt will consume trials of thin liquids (ice chips) with vital signs remaining WFL prior to repeat instrumental assessment. SLP Short Term Goal 2 (Week 1): Pt will participate in instrumental swallow assessment. SLP Short Term Goal 3 (Week 1): Pt will perform pharyngeal strength and RMT exercises to improve vocal intensity and swallow function. SLP Short Term Goal 4 (Week 1): Pt will increase vocal intensity at the phrase level to 70% intelligibility with min A verbal cues to clarify communication.  Skilled Therapeutic Interventions: Skilled treatment session focused on communication and dysphagia goals. SLP facilitated session by introducing RMT. Patient performed 15 repetitions of the EMST device at 8 cm H2O with Min verbal cues for accuracy and a self-perceived effort level of 7/10. However, patient reported intermittent lightheadedness and was unable to complete 25 repetitions as he appeared fatigued by the 3rd repetition. Recommend patient remain on 8 cm H2O at 15 repetitions and wok up to 25 repetitions as endurance improves. Patient essentially remained aphonic throughout session and required Max A multimodal cues for use of an increased vocal intensity to achieve ~75% intelligibility at the phrase level. SLP provided oral care via the suction toothbrush and patient consumed trials of ice chips. Patient utilized multiple swallows and required Mod verbal cues for use of effortful swallows as patient appeared to demonstrate reduced hyolaryngeal elevation to palpation. No overt s/s of aspiration observed. Recommend repeat MBS to assess swallow function. Patient's throat clear was also extremely weak and appears  inefficient and potentially expelling penetrates. Patient left upright upright in bed with all needs within reach. Continue with current plan of care.      Pain No/Denies Pain   Therapy/Group: Individual Therapy  Laurinda Carreno 06/26/2020, 3:18 PM

## 2020-06-26 NOTE — Progress Notes (Signed)
Performed digital stimulation x2, 15 minutes apart. No results. Small amount of smear.

## 2020-06-26 NOTE — Care Management (Signed)
Inpatient [facility] Individual Statement of Services  Patient Name:  Ryan Day  Date:  06/26/2020  Welcome to the [facility].  Our goal is to provide you with an individualized program based on your diagnosis and situation, designed to meet your specific needs.  With this comprehensive rehabilitation program, you will be expected to participate in at least 3 hours of rehabilitation therapies Monday-Friday, with modified therapy programming on the weekends.  Your rehabilitation program will include the following services:  Physical Therapy (PT), Occupational Therapy (OT), Speech Therapy (ST), 24 hour per day rehabilitation nursing, Therapeutic Recreaction (TR), Psychology, Neuropsychology, Care Coordinator, Rehabilitation Medicine, Nutrition Services, Pharmacy Services and Other  Weekly team conferences will be held on Tuesdays to discuss your progress.  Your Inpatient Rehabilitation Care Coordinator will talk with you frequently to get your input and to update you on team discussions.  Team conferences with you and your family in attendance may also be held.  Expected length of stay: 25-28 days    Overall anticipated outcome: Minimal Assistance  Depending on your progress and recovery, your program may change. Your Inpatient Rehabilitation Care Coordinator will coordinate services and will keep you informed of any changes. Your Inpatient Rehabilitation Care Coordinator's name and contact numbers are listed  below.  The following services may also be recommended but are not provided by the [facility] will be made to provide these services after discharge if needed.  Arrangements include referral to agencies that provide these services.  Your insurance has been verified to be:  Medicare A/B  Your  primary doctor is:  Satira Sark  Pertinent information will be shared with your doctor and your insurance company.  Inpatient Rehabilitation Care Coordinator:  Cathleen Corti [phone] or ([phone]  Information discussed with and copy given to patient by: Rana Snare, 06/26/2020, 12:49 PM

## 2020-06-26 NOTE — Progress Notes (Signed)
Increased TF rate to 84ml/hr

## 2020-06-26 NOTE — IPOC Note (Signed)
Overall Plan of Care [facility]) Patient Details Name: Jaelen Soth MRN: [id number] DOB: 21-Dec-1950  Admitting Diagnosis: Spinal cord injury, cervical region, sequela [facility])  Hospital Problems: Principal Problem:   Spinal cord injury, cervical region, sequela ([place]) Active Problems:   Pressure injury of skin     Functional Problem List: Nursing Bladder, Bowel, Motor, Pain, Safety, Skin Integrity, Medication Management  PT Balance, Motor, Safety, Endurance  OT Balance, Edema, Endurance, Motor, Nutrition, Pain, Skin Integrity, Cognition  SLP    TR         Basic ADL's: OT Eating, Grooming, Bathing, Dressing, Toileting     Advanced  ADL's: OT       Transfers: PT Bed Mobility, Bed to Chair, Car  OT Toilet     Locomotion: PT Wheelchair Mobility     Additional Impairments: OT Fuctional Use of Upper Extremity  SLP Swallowing, Communication expression    TR      Anticipated Outcomes Item Anticipated Outcome  Self Feeding direct care with S  Swallowing  Supervision A   Basic self-care  Total A of 1  Toileting  total A of 1   Bathroom Transfers total A of 1  Bowel/Bladder  Total assist  Transfers  [name] will be independent with transfers  Locomotion  w/c mod I  Communication  Supervision A  Cognition     Pain  Pain less than 3  Safety/Judgment  Maintain safety with ques and reminders   Therapy Plan: PT Intensity: Minimum of 1-2 x/day ,45 to 90 minutes PT Frequency: 5 out of 7 days PT Duration Estimated Length of Stay: 25 to 28 days OT Intensity: Minimum of 1-2 x/day, 45 to 90 minutes OT Frequency: 5 out of 7 days OT Duration/Estimated Length of Stay: 4 weeks SLP Intensity: Minumum of 1-2 x/day, 30 to 90 minutes SLP Frequency: 3 to 5 out of 7 days SLP Duration/Estimated Length of Stay: 4 weeks   Due to the current state of emergency, patients may not be receiving their 3-hours of Medicare-mandated therapy.   Team Interventions: Nursing Interventions  Patient/Family Education, Bladder Management, Bowel Management, Disease Management/Prevention, Pain Management, Medication Management, Skin Care/Wound Management, Dysphagia/Aspiration Precaution Training, Psychosocial Support, Discharge Planning  PT interventions Balance/vestibular training, Community reintegration, Discharge planning, DME/adaptive equipment instruction, Functional mobility training, Neuromuscular re-education, Patient/family education, Therapeutic Activities, Therapeutic Exercise, UE/LE Strength taining/ROM, UE/LE Coordination activities, Wheelchair propulsion/positioning  OT Interventions Training and development officer, Discharge planning, Functional electrical stimulation, Pain management, Self Care/advanced ADL retraining, Therapeutic Activities, UE/LE Coordination activities, Therapeutic Exercise, Skin care/wound managment, Patient/family education, Functional mobility training, Disease mangement/prevention, Community reintegration, Engineer, drilling, Neuromuscular re-education, Psychosocial support, Splinting/orthotics, UE/LE Strength taining/ROM, Wheelchair propulsion/positioning  SLP Interventions Cueing hierarchy, Dysphagia/aspiration precaution training, Therapeutic Exercise, Patient/family education  TR Interventions    SW/CM Interventions Discharge Planning, Psychosocial Support, Patient/Family Education   Barriers to Discharge MD  Medical stability, Home enviroment access/loayout, Trach, Neurogenic bowel and bladder, Wound care, Lack of/limited family support, Weight, Weight bearing restrictions and Nutritional means  Nursing      PT Inaccessible home environment, Decreased caregiver support 5 stairs to enter with B rails  OT Inaccessible home environment, Home environment access/layout, Neurogenic Bowel & Bladder, Wound Care    SLP Trach    SW Decreased caregiver support, Lack of/limited family support, Patent examiner Discharge Planning: Destination:  PT-Home ,OT- Home , SLP-Home Projected Follow-up: PT-Home health PT, OT-  Home health OT, SLP-Home Health SLP, Outpatient SLP, 24 hour supervision/assistance Projected Equipment Needs:  PT-To be determined, OT- To be determined, SLP-None recommended by SLP Equipment Details: PT- , OT-  Patient/family involved in discharge planning: PT- Patient,  OT-Patient, SLP-Patient  MD ELOS: 4 weeks Medical Rehab Prognosis:  Good Assessment: Pt is a 69 yr old male with 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   He is NPO- is his BUN and Cr are dramatically rising- have consulted dietician to help.   Goals max to total A and a lot of family education, due to C4 quadriplegia    See Team Conference Notes for weekly updates to the plan of care

## 2020-06-26 NOTE — Progress Notes (Signed)
Bowel protocol resulting in a medium sized soft brown stool

## 2020-06-26 NOTE — Progress Notes (Signed)
PHYSICAL MEDICINE & REHABILITATION PROGRESS NOTE   Subjective/Complaints:  Pt admits having BMs with bowel program- was suctioned last yesterday via trach- has trach capped, but is still requiring suctioning.   Has been capped since "last week".  Neck hurts "a little", but doesn't need pain meds.  Dizzy with sitting up- PT working on getting abd binder.   ROS:   Pt denies SOB, abd pain, CP, N/V/C/D, and vision changes   Objective:   VAS [place] LOWER EXTREMITY VENOUS (DVT)  Result Date: 06/25/2020  Lower Venous DVTStudy Indications: Immobility- quadriplegia.  Comparison Study: No prior studies. Performing Technologist: Darlin Coco  Examination Guidelines: A complete evaluation includes B-mode imaging, spectral Doppler, color Doppler, and power Doppler as needed of all accessible portions of each vessel. Bilateral testing is considered an integral part of a complete examination. Limited examinations for reoccurring indications may be performed as noted. The reflux portion of the exam is performed with the patient in reverse Trendelenburg.  +---------+---------------+---------+-----------+----------+--------------+ RIGHT    CompressibilityPhasicitySpontaneityPropertiesThrombus Aging +---------+---------------+---------+-----------+----------+--------------+ CFV      Full           Yes      Yes                                 +---------+---------------+---------+-----------+----------+--------------+ SFJ      Full                                                        +---------+---------------+---------+-----------+----------+--------------+ FV Prox  Full                                                        +---------+---------------+---------+-----------+----------+--------------+ FV Mid   Full                                                        +---------+---------------+---------+-----------+----------+--------------+ FV DistalFull                                                         +---------+---------------+---------+-----------+----------+--------------+ PFV      Full                                                        +---------+---------------+---------+-----------+----------+--------------+ POP      Full           Yes      Yes                                 +---------+---------------+---------+-----------+----------+--------------+ PTV  Full                                                        +---------+---------------+---------+-----------+----------+--------------+ PERO     Full                                                        +---------+---------------+---------+-----------+----------+--------------+   +---------+---------------+---------+-----------+----------+--------------+ LEFT     CompressibilityPhasicitySpontaneityPropertiesThrombus Aging +---------+---------------+---------+-----------+----------+--------------+ CFV      Full           Yes      Yes                                 +---------+---------------+---------+-----------+----------+--------------+ SFJ      Full                                                        +---------+---------------+---------+-----------+----------+--------------+ FV Prox  Full                                                        +---------+---------------+---------+-----------+----------+--------------+ FV Mid   Full                                                        +---------+---------------+---------+-----------+----------+--------------+ FV DistalFull                                                        +---------+---------------+---------+-----------+----------+--------------+ PFV      Full           Yes      Yes                                 +---------+---------------+---------+-----------+----------+--------------+ POP      Full           Yes      Yes                                  +---------+---------------+---------+-----------+----------+--------------+ PTV      Full                                                        +---------+---------------+---------+-----------+----------+--------------+  PERO     Full                                                        +---------+---------------+---------+-----------+----------+--------------+     Summary: RIGHT: - There is no evidence of deep vein thrombosis in the lower extremity.  - No cystic structure found in the popliteal fossa.  LEFT: - There is no evidence of deep vein thrombosis in the lower extremity.  - No cystic structure found in the popliteal fossa.  *See table(s) above for measurements and observations. Electronically signed by Harold Barban MD on 06/25/2020 at 7:42:18 PM.    Final    Recent Labs    06/24/20 0623  WBC 7.2  HGB 10.6*  HCT 32.9*  PLT 163   Recent Labs    06/24/20 0623 06/26/20 0554  NA 144 144  K 3.5 3.9  CL 105 108  CO2 27 25  GLUCOSE 118* 115*  BUN 27* 54*  CREATININE 0.72 1.19  CALCIUM 10.0 9.7    Intake/Output Summary (Last 24 hours) at 06/26/2020 1555 Last data filed at 06/26/2020 1300 Gross per 24 hour  Intake 0 ml  Output 1850 ml  Net -1850 ml     Pressure Injury 06/23/20 Coccyx Medial Stage 2 -  Partial thickness loss of dermis presenting as a shallow open injury with a red, pink wound bed without slough. (Active)  06/23/20 1710  Location: Coccyx  Location Orientation: Medial  Staging: Stage 2 -  Partial thickness loss of dermis presenting as a shallow open injury with a red, pink wound bed without slough.  Wound Description (Comments):   Present on Admission: Yes     Pressure Injury 06/23/20 Buttocks Right Stage 2 -  Partial thickness loss of dermis presenting as a shallow open injury with a red, pink wound bed without slough. (Active)  06/23/20 1838  Location: Buttocks  Location Orientation: Right  Staging: Stage 2 -  Partial thickness loss of dermis  presenting as a shallow open injury with a red, pink wound bed without slough.  Wound Description (Comments):   Present on Admission: Yes     Pressure Injury 06/23/20 Left Stage 2 -  Partial thickness loss of dermis presenting as a shallow open injury with a red, pink wound bed without slough. (Active)  06/23/20 1839  Location:   Location Orientation: Left  Staging: Stage 2 -  Partial thickness loss of dermis presenting as a shallow open injury with a red, pink wound bed without slough.  Wound Description (Comments):   Present on Admission: Yes    Physical Exam: Vital Signs Blood pressure 92/60, pulse 72, temperature 97.6 F (36.4 C), resp. rate 18, height 6\' 5"  (1.956 m), weight 105.2 kg, SpO2 100 %. General: pt is sitting up slightly in bed, appropriate, NAD HEENT: trach capped- sats 96% Heart: RRR Chest: good air movement- no w/r/r  -d ecreased at bases Abdomen: soft, ND, NT, hypoactive BS- (+) PEG in place Extremities: No clubbing, cyanosis, or edema. Pulses are 2+ Skin: stage 2 pressure injuries on buttock and coccyx Neuro: Dysphonia (wife reports close to baseline).  Motor: B/l UE shoulder abduction 1+/5, distally 0/5 with ?trace flicker left wrist flexor B/l LE: 0/5 proximal to distal Sensation diminished to light touch in all extremities Psych: appropriate, cordial  Assessment/Plan: 1. 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  Physiatrist is providing close team supervision and 24 hour management of active medical problems listed below.  Physiatrist and rehab team continue to assess barriers to discharge/monitor patient progress toward functional and medical goals  Care Tool:  Bathing              Bathing assist Assist Level: 2 Helpers     Upper Body Dressing/Undressing Upper body dressing   What is the patient wearing?: Pull over shirt     Upper body assist Assist Level: 2 Helpers    Lower Body Dressing/Undressing Lower body dressing      What is the patient wearing?: Incontinence brief     Lower body assist Assist for lower body dressing: 2 Helpers     Toileting Toileting    Toileting assist Assist for toileting: Dependent - Patient 0%     Transfers Chair/bed transfer  Transfers assist  Chair/bed transfer activity did not occur: Safety/medical concerns  Chair/bed transfer assist level: Dependent - mechanical lift     Locomotion Ambulation   Ambulation assist   Ambulation activity did not occur: Safety/medical concerns          Walk 10 feet activity   Assist           Walk 50 feet activity   Assist           Walk 150 feet activity   Assist           Walk 10 feet on uneven surface  activity   Assist           Wheelchair     Assist Will patient use wheelchair at discharge?: Yes Type of Wheelchair: Power Wheelchair activity did not occur: Safety/medical concerns         Wheelchair 50 feet with 2 turns activity    Assist    Wheelchair 50 feet with 2 turns activity did not occur: Safety/medical concerns       Wheelchair 150 feet activity     Assist  Wheelchair 150 feet activity did not occur: Safety/medical concerns       Blood pressure 92/60, pulse 72, temperature 97.6 F (36.4 C), resp. rate 18, height 6\' 5"  (1.956 m), weight 105.2 kg, SpO2 100 %.    Medical Problem List and Plan: 1.  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  Antithrombotics: -DVT/anticoagulation:  Pharmaceutical: Lovenox--check dopplers in am.              -antiplatelet therapy: N/A 3. Pain Management: N/A 4. Mood: LCSW to follow for evaluation and support.              -antipsychotic agents: N/A 5. Neuropsych: This patient is capable of  making decisions on his own behalf. 6. Sacral decub/Skin/Wound Care: Pictures show wound in different stages of healing. Ordered Air mattress overlay. Continue vitamin and prostat to promote healing. Will add Juven also for collagen. Will increase tube feed to 50 cc/hr as likely has higher nutritional need. Will consult dietician to help address nutritional needs.   10/4- will consult dietician- BUN up to 54 from  27 and Cr 1.19 from <1- asked to evaluate dryness 7. Fluids/Electrolytes/Nutrition: Monitor I/Os.CMP ordered. Transition to bolus tube feeds next week if stable.  8. Hypotension: Secondary to autonomic dysfunction has resolved.              Monitor with increased activity, particularly for orthostasis.   10/3 well controlled   10/4- will get abd binder- cut a hole for PEG 9. Acute on chronic anemia: Improving--continue to monitor. Added iron supplement.              CBC stable 10/2 10. Hypernatremia/Hyperglycemia: Due to intermittent IV dextrose. Added water flushes. May need low sodium formula.   10/4- Na 144- doing OK 11. Neurogenic bladder: Maintain foley due to sacral wounds.. 12. Neurogenic bowel:Continue senna in am with suppository in evenings for bowel program. Moving bowels regularly   10/3: Patient states he is moving his bowels regularly.  13. Secretions: continue scopolamine patch 14. Azotemia/NPO- getting TFs  10/4- consulted dietician   LOS: 3 days A FACE TO FACE EVALUATION WAS PERFORMED  Ryan Day 06/26/2020, 3:55 PM

## 2020-06-26 NOTE — Progress Notes (Signed)
Patient Details  Name: Ryan Day MRN: [id number] Date of Birth: [birth year]  Today's Date: 06/26/2020  Hospital Problems: Principal Problem:   Spinal cord injury, cervical region, sequela ([place]) Active Problems:   Pressure injury of skin  Past Medical History:  Past Medical History:  Diagnosis Date  . Acute on chronic respiratory failure with hypoxia ([place])   . Acute renal injury due to hypovolemia ([place])   . Autonomic instability   . Cardiac arrest ([place])   . Cervical spinal cord injury, sequela ([place])   . Elevated alkaline phosphatase level   . 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 Hyperlipidemia   . Hypertension    Past Surgical History:  Past Surgical History:  Procedure Laterality Date  . COLONOSCOPY WITH PROPOFOL N/A 12/05/2017   Procedure: COLONOSCOPY WITH PROPOFOL;  Surgeon: Lin Landsman, MD;  Location: [facility] ENDOSCOPY;  Service: Gastroenterology;  Laterality: N/A;  . ESOPHAGOGASTRODUODENOSCOPY  12/05/2017   Procedure: ESOPHAGOGASTRODUODENOSCOPY (EGD);  Surgeon: Lin Landsman, MD;  Location: [facility] ENDOSCOPY;  Service: Gastroenterology;;   Social History:  reports that he has never smoked. He has never used smokeless tobacco. He reports current alcohol use. He reports that he does not use drugs.  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 Caregiver Availability: 24/7 Family Dynamics: Pt lives with wife; both have been working up until this incident.  Social History Preferred language:  English Religion: Non-Denominational Cultural Background: Pt has been working in Landscape architect for 17 years Education: 11th grade Read: Yes Write: Yes Employment Status: Employed Length of Employment: 17 Return to Work Plans: Pt would like to return to work if possible Public relations account executive Issues: Denies Guardian/Conservator: N/A   Abuse/Neglect Abuse/Neglect Assessment Can Be Completed: Yes Physical Abuse: Denies Verbal Abuse: Denies Sexual Abuse: Denies Exploitation of patient/patient's resources: Denies Self-Neglect: Denies  Emotional Status Pt's affect, behavior and adjustment status: Pt in good spirits at time of visit Recent Psychosocial Issues: Denies Psychiatric History: Denies Substance Abuse History: denies; rare occasion of etoh use  Patient / Family Perceptions, Expectations & Goals Pt/Family understanding of illness & functional limitations: Pt and family have a general understanding of care needs Premorbid pt/family roles/activities: Independent Anticipated changes in roles/activities/participation: Assistance with ADLs/IADLs Pt/family expectations/goals: pt foal si to get back strength in [place]: None Premorbid Home Care/DME Agencies: None Transportation available at discharge: family Resource referrals recommended: Neuropsychology  Discharge Planning Living Arrangements: Spouse/significant other Support Systems: Spouse/significant other, Children Type of Residence: Private residence Insurance Resources: [employer], Multimedia programmer (specify) Nurse, mental health) Financial Resources: Employment, Secondary school teacher Screen Referred: No Living Expenses: Own Money Management: Spouse Does the patient have any problems obtaining your medications?: No Home Management: Both he and wife managed meal prep and housekeeping Patient/Family Preliminary Plans: wife/family to perform meal prep and housekeeping needs Care  Coordinator Barriers to Discharge: Decreased caregiver support, Lack of/limited family support, Trach Care Coordinator Anticipated Follow Up Needs: HH/OP Expected length of stay: 25-28 days  Clinical Impression SW met with pt in room to introduce self, explain role, and  discuss discharge process. Pt has no HCPOA. Not a veteran. No DME. Pt aware SW to follow-up with his wife.   SW spoke with pt wife Ryan Day to introduce self, explain role, and discuss discharge process. SW informed on ELOS (25-28 days). She reports that she will be able to provide care to her husband, and will get assistance from her son who lives close by to assist as needed. She reports their two daughters (both RNs) will be taking off the first two weeks to assist. SW encouraged her to speak with her children about FMLA forms that need to be completed so we can complete forms. SW informed will follow-up with updates after team conference tomorrow.   Auria A Chamberlain 06/26/2020, 1:27 PM

## 2020-06-27 ENCOUNTER — Inpatient Hospital Stay (HOSPITAL_COMMUNITY): Payer: BC Managed Care – PPO | Admitting: Physical Therapy

## 2020-06-27 ENCOUNTER — Inpatient Hospital Stay (HOSPITAL_COMMUNITY): Payer: BC Managed Care – PPO

## 2020-06-27 ENCOUNTER — Encounter (HOSPITAL_COMMUNITY): Payer: BC Managed Care – PPO | Admitting: Speech Pathology

## 2020-06-27 LAB — GLUCOSE, CAPILLARY
Glucose-Capillary: 120 mg/dL — ABNORMAL HIGH (ref 70–99)
Glucose-Capillary: 126 mg/dL — ABNORMAL HIGH (ref 70–99)
Glucose-Capillary: 127 mg/dL — ABNORMAL HIGH (ref 70–99)
Glucose-Capillary: 133 mg/dL — ABNORMAL HIGH (ref 70–99)
Glucose-Capillary: 138 mg/dL — ABNORMAL HIGH (ref 70–99)
Glucose-Capillary: 151 mg/dL — ABNORMAL HIGH (ref 70–99)
Glucose-Capillary: 178 mg/dL — ABNORMAL HIGH (ref 70–99)

## 2020-06-27 MED ORDER — MIDODRINE HCL 5 MG PO TABS
5.0000 mg | ORAL_TABLET | Freq: Three times a day (TID) | ORAL | Status: DC
  Administered 2020-06-27 – 2020-07-07 (×30): 5 mg
  Filled 2020-06-27 (×30): qty 1

## 2020-06-27 MED ORDER — PROSOURCE TF PO LIQD
45.0000 mL | Freq: Four times a day (QID) | ORAL | Status: DC
  Administered 2020-06-27 – 2020-06-29 (×7): 45 mL
  Filled 2020-06-27 (×6): qty 45

## 2020-06-27 MED ORDER — FREE WATER
275.0000 mL | Status: DC
  Administered 2020-06-27 – 2020-06-29 (×12): 275 mL

## 2020-06-27 NOTE — Plan of Care (Signed)
°  Problem: RH Swallowing Goal: LTG Patient will consume least restrictive diet using compensatory strategies with assistance (SLP) Description: LTG:  Patient will consume least restrictive diet using compensatory strategies with assistance (SLP) Flowsheets (Taken 06/27/2020 1132) LTG: Pt Patient will consume least restrictive diet using compensatory strategies with assistance of (SLP): Supervision Goal: LTG Patient will participate in dysphagia therapy to increase swallow function with assistance (SLP) Description: LTG:  Patient will participate in dysphagia therapy to increase swallow function with assistance (SLP) Flowsheets (Taken 06/27/2020 1132) LTG: Pt will participate in dysphagia therapy to increase swallow function with assistance of (SLP): Supervision Goal: LTG Pt will demonstrate functional change in swallow as evidenced by bedside/clinical objective assessment (SLP) Description: LTG: Patient will demonstrate functional change in swallow as evidenced by bedside/clinical objective assessment (SLP) Flowsheets (Taken 06/27/2020 1132) LTG: Patient will demonstrate functional change in swallow as evidenced by bedside/clinical objective assessment: Oropharyngeal swallow   Problem: RH Expression Communication Goal: LTG Patient will increase speech intelligibility (SLP) Description: LTG: Patient will increase speech intelligibility at word/phrase/conversation level with cues, % of the time (SLP) Flowsheets (Taken 06/27/2020 1132) LTG: Patient will increase speech intelligibility (SLP): Supervision Level: (sentence level) -- Percent of time patient will use intelligible speech: 85%

## 2020-06-27 NOTE — Progress Notes (Signed)
Nutrition Follow-up  DOCUMENTATION CODES:   Not applicable  INTERVENTION:  Continue tube feeding via PEG: - Osmolite 1.5 at 80 ml/hr x 20 hours per day (allowing for 4 hours off TF for therapy) (1600 ml per day) - ProSource TF 45 ml QID  Tube feeding regimen provides 2560 kcal, 144 grams of protein, and 1219 ml free water daily.  - Juven BID via tube, each packet provides 80 calories, 8 grams of carbohydrate, 2.5 grams of protein, 7 grams of L-arginine and 7 grams of L-glutamine; supplement contains CaHMB, vitamins C, E, B12 and Zinc to promote wound healing.  - Free water flushes 275 ml every 4 hours for a total of 1650 ml additional free water (2869 ml total with TF + flushes).  - MVI daily per tube  NUTRITION DIAGNOSIS:   Increased nutrient needs related to wound healing as evidenced by estimated needs.  Ongoing  GOAL:   Patient will meet greater than or equal to 90% of their needs  Met via TF  MONITOR:   TF tolerance, Skin, Diet advancement, Labs  REASON FOR ASSESSMENT:   Consult Enteral/tube feeding initiation and management  ASSESSMENT:   69 yo male admitted with functional deficits due to SCI/quadriplegia. PMH includes recent admission to [facility] 8/4 s/p fall, striking his head, and resulting in quadriplegia. Hospitalization complicated by PTX, C diff colitis, PEA, respiratory distress, aspiration PNA, AKI. Also required trach and PEG. Discharged to [facility] 9/15. Transferred to [place] for intense rehab as recommended by his therapy team.  RD consulted to adjust free water flushes due to increase in BUN and creatinine. Discussed plan with MD.  No new weights since 10/01.  Spoke with pt and wife at bedside. Pt tolerating tube feeds without issue. No reports of nausea or abdominal discomfort.  Medications reviewed and include: vitamin C, pepcid, SSI q 4 hours, liquid MVI, Juven, florastor, senokot, zinc sulfate  Labs reviewed: BUN 54,  creatinine 1.19 CBG's: 120-178 x 24 hours  UOP: 1925 ml x 24 hours  Diet Order:   Diet Order            Diet NPO time specified  Diet effective now                 EDUCATION NEEDS:   Not appropriate for education at this time  Skin:  Skin Assessment: Skin Integrity Issues: Stage II: coccyx, buttocks  Last BM:  06/26/20 medium type 6  Height:   Ht Readings from Last 1 Encounters:  06/23/20 '6\' 5"'  (1.956 m)    Weight:   Wt Readings from Last 1 Encounters:  06/23/20 105.2 kg    Ideal Body Weight:  94.5 kg  BMI:  Body mass index is 27.5 kg/m.  Estimated Nutritional Needs:   Kcal:  2400-2600  Protein:  145-165 gm  Fluid:  >/= 2.4 L    Ryan Face, MS, RD, LDN Inpatient Clinical Dietitian Please see AMiON for contact information.

## 2020-06-27 NOTE — Progress Notes (Signed)
Ryan Day PHYSICAL MEDICINE & REHABILITATION PROGRESS NOTE   Subjective/Complaints:  Doesn't have abd binder yet, that we can find- will ask nursing- Per PT< pt had low BP yesterday with sitting down to 70s/40s- was dizzy as well-  Have ACE wrapped legs to try and help.   Cannot use call bell well- only with scapular retraction- possibly- PT/OT working on getting sip and puff call bell.  Per PT< TFs are leaking, but nursing fixed.   Last Suctioning was Sunday AM  Had BM with bowel program las tnight.   ROS:   Pt denies SOB, abd pain, CP, N/V/C/D, and vision changes  Objective:   No results found. No results for input(s): WBC, HGB, HCT, PLT in the last 72 hours. Recent Labs    06/26/20 0554  NA 144  K 3.9  CL 108  CO2 25  GLUCOSE 115*  BUN 54*  CREATININE 1.19  CALCIUM 9.7    Intake/Output Summary (Last 24 hours) at 06/27/2020 1020 Last data filed at 06/27/2020 0510 Gross per 24 hour  Intake 2135 ml  Output 725 ml  Net 1410 ml     Pressure Injury 06/23/20 Coccyx Medial Stage 2 -  Partial thickness loss of dermis presenting as a shallow open injury with a red, pink wound bed without slough. (Active)  06/23/20 1710  Location: Coccyx  Location Orientation: Medial  Staging: Stage 2 -  Partial thickness loss of dermis presenting as a shallow open injury with a red, pink wound bed without slough.  Wound Description (Comments):   Present on Admission: Yes     Pressure Injury 06/23/20 Buttocks Right Stage 2 -  Partial thickness loss of dermis presenting as a shallow open injury with a red, pink wound bed without slough. (Active)  06/23/20 1838  Location: Buttocks  Location Orientation: Right  Staging: Stage 2 -  Partial thickness loss of dermis presenting as a shallow open injury with a red, pink wound bed without slough.  Wound Description (Comments):   Present on Admission: Yes     Pressure Injury 06/23/20 Left Stage 2 -  Partial thickness loss of dermis presenting  as a shallow open injury with a red, pink wound bed without slough. (Active)  06/23/20 1839  Location:   Location Orientation: Left  Staging: Stage 2 -  Partial thickness loss of dermis presenting as a shallow open injury with a red, pink wound bed without slough.  Wound Description (Comments):   Present on Admission: Yes    Physical Exam: Vital Signs Blood pressure 104/68, pulse 72, temperature 97.7 F (36.5 C), temperature source Oral, resp. rate 18, height 6\' 5"  (1.956 m), weight 105.2 kg, SpO2 98 %. General: pt laying in bed- PT in room, NAD HEENT: trach capped- sats 98% Heart: RRR Chest: initially, before quad cough, was very coarse and sounded like needed suctioning- after oral suctioning and quad cough, MUCH better- no W/R/R- better air movement Abdomen: Soft, NT, ND, (+)BS ; (+) PEG in place Extremities: No clubbing, cyanosis, or edema. Pulses are 2+ Skin: stage 2 pressure injuries on buttock and coccyx Neuro: Dysphonia (wife reports close to baseline).  Motor: B/l UE shoulder abduction 1+/5, distally 0/5 with ?trace flicker left wrist flexor B/l LE: 0/5 proximal to distal Sensation diminished to light touch in all extremities Psych: quiet     Assessment/Plan: 1. 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  Physiatrist is providing  close team supervision and 24 hour management of active medical problems listed below.  Physiatrist and rehab team continue to assess barriers to discharge/monitor patient progress toward functional and medical goals  Care Tool:  Bathing              Bathing assist Assist Level: 2 Helpers     Upper Body Dressing/Undressing Upper body dressing   What is the patient wearing?: Pull over shirt    Upper body assist Assist Level: 2 Helpers    Lower Body Dressing/Undressing Lower body dressing      What is the patient  wearing?: Incontinence brief     Lower body assist Assist for lower body dressing: 2 Helpers     Toileting Toileting    Toileting assist Assist for toileting: Dependent - Patient 0%     Transfers Chair/bed transfer  Transfers assist  Chair/bed transfer activity did not occur: Safety/medical concerns  Chair/bed transfer assist level: Dependent - mechanical lift     Locomotion Ambulation   Ambulation assist   Ambulation activity did not occur: Safety/medical concerns          Walk 10 feet activity   Assist           Walk 50 feet activity   Assist           Walk 150 feet activity   Assist           Walk 10 feet on uneven surface  activity   Assist           Wheelchair     Assist Will patient use wheelchair at discharge?: Yes Type of Wheelchair: Power Wheelchair activity did not occur: Safety/medical concerns         Wheelchair 50 feet with 2 turns activity    Assist    Wheelchair 50 feet with 2 turns activity did not occur: Safety/medical concerns       Wheelchair 150 feet activity     Assist  Wheelchair 150 feet activity did not occur: Safety/medical concerns       Blood pressure 104/68, pulse 72, temperature 97.7 F (36.5 C), temperature source Oral, resp. rate 18, height 6\' 5"  (1.956 m), weight 105.2 kg, SpO2 98 %.    Medical Problem List and Plan: 1.  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  Antithrombotics: -DVT/anticoagulation:  Pharmaceutical: Lovenox--check dopplers in am.  10/5- Dopplers (-)             -antiplatelet therapy: N/A 3. Pain Management: N/A 4. Mood: LCSW to follow for evaluation and support.              -antipsychotic agents: N/A 5. Neuropsych: This patient is capable of making decisions on his own behalf. 6. Sacral decub/Skin/Wound Care: Pictures show wound  in different stages of healing. Ordered Air mattress overlay. Continue vitamin and prostat to promote healing. Will add Juven also for collagen. Will increase tube feed to 50 cc/hr as likely has higher nutritional need. Will consult dietician to help address nutritional needs.   10/4- will consult dietician- BUN up to 54 from 27 and Cr 1.19 from <1- asked to evaluate dryness  10/5- haven't seen a dietician note- will check on this.  7. Fluids/Electrolytes/Nutrition: Monitor I/Os.CMP ordered. Transition to bolus tube feeds next week if stable.   10/5- very dry- have placed Dietician order- need to help pt with BUN up to 54 8. Orthostatic Hypotension: Secondary to autonomic dysfunction has resolved.              Monitor with increased activity, particularly for orthostasis.   10/3 well controlled   10/4- will get abd binder- cut a hole for PEG  10/5- will add Midodrine 5 mg TID with meals- due to BP 50Z/ systolic 9. Acute on chronic anemia: Improving--continue to monitor. Added iron supplement.              CBC stable 10/2 10. Hypernatremia/Hyperglycemia: Due to intermittent IV dextrose. Added water flushes. May need low sodium formula.   10/4- Na 144- doing OK 11. Neurogenic bladder: Maintain foley due to sacral wounds.. 12. Neurogenic bowel:Continue senna in am with suppository in evenings for bowel program. Moving bowels regularly   10/3: Patient states he is moving his bowels regularly.  10/5- LBM last night with bowel program  13. Secretions/Trach/capped: continue scopolamine patch  10/5- will teach staff about quad coughing-  14. Azotemia/NPO- getting TFs  10/4- consulted dietician  10/5- don't see note from nutrition- will check on this.        LOS: 4 days A FACE TO FACE EVALUATION WAS PERFORMED  Yanessa Hocevar 06/27/2020, 10:20 AM

## 2020-06-27 NOTE — Progress Notes (Addendum)
Patient noted sleeping in his room at the beginning of the shift & when medication administration initiated. A new bottle of the osmolite was hung, peg tube was patent, no signs of aspiration or difficulty with peg administration. He slept through most of the procedure. He is verbally responsive, but talks very softly. Lurline Idol was in place & is capped. New trachs are at the bedside. A little later, this nurse & tech came into the room to turn him, put on his orthoboots, splints & to start his bowel program. When he was turned he already had liquid stool smeared in his brief. He also had a foam to his sacrum that some feces had gotten underneath. Foam was removed & noted 3 pressure injuries. One to the coccyx, one to the right lower buttock & one to the left upper buttock. The pressure injuries look like they are starting to connect. The one on the right buttock has approximately 0.3cm of depth & is beefy red. It bled while supplies were being gathered to clean the wound. The coccyx wound was previously listed as a stage 2 has a leathery, brownish covering & a separation around the rim of the wound. It is unstageable at this point.The one on the left buttock looks like a few small open areas with areas of pink discoloration on the intact skin. Areas were cleansed with normal saline & recovered with a foam dressing. The dressing may come off due to the multiple bowel movements reported by the previous shift & the liquid stool noted. His bowel program was initiated & he was cleaned & turned towards the window. His rectal vault had more liquid stool that could be felt. There were foams on both heels, when removed both heels skin is intact but there is a area of discoloration to the right heel. His skin is very dry & flaky to BLE & BUE. Non-pitting edema to bil feet, his peg tube site is intact without a drain sponge noted, foley is intact with a securement device to the left upper thigh. This nurse did not have a chance  to measure the wounds this time. Will attempt when he is turned again & recheck for bowel program. Podus boots & resting hand splints were applied after removing bil ace wraps & thigh high ted hose. No c/o pain or discomfort. He is in a fowlers position to the right side with suction accessible. He has an air mattress & continuous pulse ox in place. Vitals were stable. Will continue to monitor.

## 2020-06-27 NOTE — Patient Care Conference (Signed)
Inpatient RehabilitationTeam Conference and Plan of Care Update Date: 06/27/2020   Time: 11:02 AM    Patient Name: Ryan Day      Medical Record Number: [id number]  Date of Birth: 1951/03/17 Sex: Male         Room/Bed: 4W15C/4W15C-01 Payor Info: Payor: MEDICARE / Plan: MEDICARE PART A / Product Type: *No Product type* /    Admit Date/Time:  06/23/2020  4:40 PM  Primary Diagnosis:  Spinal cord injury, cervical region, sequela [facility])  Hospital Problems: Principal Problem:   Spinal cord injury, cervical region, sequela ([place]) Active Problems:   Pressure injury of skin    Expected Discharge Date: Expected Discharge Date:  (Set next week)  Team Members Present: Physician leading conference: Dr. Courtney Heys Care Coodinator Present: Dorthula Nettles, RN, BSN, CRRN;[name], LCSWA Nurse Present: Judee Clara, LPN PT Present: Excell Seltzer, PT OT Present: Willeen Cass, OT;Roanna Epley, COTA SLP Present: Charolett Bumpers, SLP PPS Coordinator present : Ileana Ladd, Burna Mortimer, SLP     Current Status/Progress Goal Weekly Team Focus  Bowel/Bladder   Bowel program with [facility] 06/26/2020. Foley for neurogenic bladder.  Continue bowel protocol with results  Assess B/B every shift and prn   Swallow/Nutrition/ Hydration   NPO  Supervision A  pharyngeal strength and RMT   ADL's   tot A for bed mobility and bathing/dressing at bed level; dependent tranfsers with MaxiSky  dynamic sitting balance-mod A; self feeding-max A; grooming-supervision; directing care with BADLs and transfers-supervision  activity tolerance, bed mobility, sitting balance, establishing BADL routine, education   Mobility   +2 rolling, +2 supine to/from sit, transfer dependently via lift  mod I at [facility] level, family independent with assisting pt with transfers  BP management, OOB tolerance, SCI education   Communication   70% intelligibility, max-mod A  Supervision A  speech intelligibility strategies, vocal  intensity and and RMT   Safety/Cognition/ Behavioral Observations            Pain   Consistently denies pain  pain score <3/10  Assess pain every shift and PRN   Skin   Stage II to buttocks to right buttocks with foam dressing. Consult to wound care  No new breakdown  Assess skin every shift and PRN     Discharge Planning:      Team Discussion: No c/o pain, foley patent and draining, 2 BM's today. Stage 2 bottom with foam putting in for [place] consult. PT dependent or maxisky for transfers. OT dependent for ADL's, goals set for directing grooming and selfcare are supervision. SLP Modified swallow was not good. Patient on target to meet rehab goals: yes, slow to progress.  *See Care Plan and progress notes for long and short-term goals.   Revisions to Treatment Plan:  MD added Midodrine, Ab binder order placed.  Teaching Needs: Continue family education  Current Barriers to Discharge: Decreased caregiver support, Home enviroment access/layout, Trach, Neurogenic bowel and bladder, Wound care, Lack of/limited family support, Weight and Weight bearing restrictions  Possible Resolutions to Barriers: Teach trach care, educate weight bearing restrictions, teach foley care, and bowel program, teach wound care and dressing changes.     Medical Summary Current Status: low BP 72Z sytolic; using PO yonkeur, last suctioning Sunday- foley OK; BUN 54- stage II- greenish white tissue- will do wound consult  Barriers to Discharge: Decreased family/caregiver support;Home enviroment access/layout;Incontinence;Neurogenic Bowel & Bladder;Weight;Weight bearing restrictions;Wound care;Medical stability;Trach  Barriers to Discharge Comments: sacral/buttocks wounds- capped- needs abdominal binder- needs loaner w/c.  Possible Resolutions to [employer]: quad cough; dietician for BUN 54- midodrine for low BP;- set d/c date next week   Continued Need for Acute Rehabilitation Level of Care: The  patient requires daily medical management by a physician with specialized training in physical medicine and rehabilitation for the following reasons: Direction of a multidisciplinary physical rehabilitation program to maximize functional independence : Yes Medical management of patient stability for increased activity during participation in an intensive rehabilitation regime.: Yes Analysis of laboratory values and/or radiology reports with any subsequent need for medication adjustment and/or medical intervention. : Yes   I attest that I was present, lead the team conference, and concur with the assessment and plan of the team.   Cristi Loron 06/27/2020, 3:58 PM

## 2020-06-27 NOTE — Progress Notes (Signed)
Physical Therapy Session Note  Patient Details  Name: Ryan Day MRN: [id number] Date of Birth: [birth year]  Today's Date: 06/27/2020 PT Individual Time: 0800-0900 PT Individual Time Calculation (min): 60 min   Short Term Goals: Week 1:  PT Short Term Goal 1 (Week 1): Pt will begin to instruct caregiver/staff in bed mobility and transfers. PT Short Term Goal 2 (Week 1): Pt will increase sitting tolerance on edge of bed to 30 minutes assist with ADL/mobility PT Short Term Goal 3 (Week 1): Pt will increase sitting balance on edge of bed to mod to max A to assist with ADLs/mobility PT Short Term Goal 4 (Week 1): Pt will propel power w/c on level surfaces with S and verbal cues.  Skilled Therapeutic Interventions/Progress Updates:    Pt received seated in bed, agreeable to PT session. No complaints of pain. Semi-reclined BP 107/60. Assisted pt with donning thigh-high TEDs and BLE ACE wrap at bed level dependently. Pt is assist x 2 for rolling R/L for dependent donning of pants. Pt's PEG tube found to be leaking, nursing notified and in room to address. MD also in room to assess pt this AM and provided demonstration of quad-coughing techniques. Assisted pt with changing into clean gown as well due to leakage from PEG tube, dependent for doffing and donning of new gown. Per nursing pt has MBS following this session so needs to remain in bed. Pt left seated in bed with needs in reach in care of nursing at end of session.  Therapy Documentation Precautions:  Precautions Precautions: Cervical Precaution Booklet Issued: No Precaution Comments: C collar on when egde of bed, upright or OOB Required Braces or Orthoses: Cervical Brace Cervical Brace: Hard collar, Other (comment) (C collar when edge of bed, upright or OOB) Restrictions Weight Bearing Restrictions: No    Therapy/Group: Individual Therapy   Excell Seltzer, PT, DPT  06/27/2020, 9:59 AM

## 2020-06-27 NOTE — Progress Notes (Signed)
Orthopedic Tech Progress Note Patient Details:  Ryan Day 1950/11/13 [id number] RN called requesting an ABDOMINAL BINDER Ortho Devices Type of Ortho Device: Abdominal binder Ortho Device/Splint Interventions: Other (comment)   Post Interventions Patient Tolerated: Well Instructions Provided: Care of device   [name] 06/27/2020, 12:31 PM

## 2020-06-27 NOTE — Progress Notes (Signed)
Physical Therapy Session Note  Patient Details  Name: Ryan Day MRN: [id number] Date of Birth: 04-01-1951  Today's Date: 06/27/2020 PT Individual Time: 0800-0900 PT Individual Time Calculation (min): 60 min   Short Term Goals: Week 1:  PT Short Term Goal 1 (Week 1): Pt will begin to instruct caregiver/staff in bed mobility and transfers. PT Short Term Goal 2 (Week 1): Pt will increase sitting tolerance on edge of bed to 30 minutes assist with ADL/mobility PT Short Term Goal 3 (Week 1): Pt will increase sitting balance on edge of bed to mod to max A to assist with ADLs/mobility PT Short Term Goal 4 (Week 1): Pt will propel power w/c on level surfaces with S and verbal cues.  Skilled Therapeutic Interventions/Progress Updates:  Pt resting in bed; he denied pain.  Wife present.  Pt used oral suction via bendable connection on bed, throughout session.  Pt had difficulty pressing soft call bell in bed. He stated that he had mild dizziness; BP 84/57 in supine with HOB raised.  TEDS and ACES already on LEs above knees.  Pt requested ice chips, which PT spooned into his mouth, and provided mouth moisturizer.  [place] nurses checked with charge nurse and administered meds for hypotension during session.  PROM bil LEs with pt in supine.  Pt noted to have had a bowel accident.  Rolling L><R +2 for hygiene, change of brief, managing pants, donning abdominal binder, and placing sling for MaxiSky.  Hard cervical collar placed on pt while he was in supine.  +2 for use of MaxiSky to transfer pt to tilt in space w/c.  In partially tilted position, BP  82/56, HR 63 after 10 minutes in wc; no c/o dizziness.  Pt would benefit from legrests being lengthened.  Gait belt placed around legrests, and pillows placed lateral to hips, to address bil hip external rotation.  With soft call bell placed on top of pt's shoulder, pt able to effectively press it with   SCI education throughout session, for pt and his wife,  regarding: bed mobility, transfers using lift,  hypotension and use of binder, TEDS and ACES to address it; wc positioning to decrease pressure on bottom.  Wife brought in pt's personal smart phone.  At end of session, pt comfortably positioned in w/c, with needs at hand and wife present.     Therapy Documentation Precautions:  Precautions Precautions: Cervical Precaution Booklet Issued: No Precaution Comments: C collar on when egde of bed, upright or OOB Required Braces or Orthoses: Cervical Brace Cervical Brace: Hard collar, Other (comment) (C collar when edge of bed, upright or OOB) Restrictions Weight Bearing Restrictions: No      Therapy/Group: Individual Therapy  [name] 06/27/2020, 10:46 AM

## 2020-06-27 NOTE — Progress Notes (Signed)
Patient ID: Ryan Day, male   DOB: 1950/12/31, 69 y.o.   MRN: [id number]  SW met with pt in room to provide updates from team conference, and informed him there will be updates next week after team conference with more details and to see gains made. Pt aware SW to follow-up with his wife.   SW made efforts to contact pt wife [name] [phone]) but no answer.   [name], MSW, [place] Office: [phone] Cell: [phone] Fax: [phone]

## 2020-06-27 NOTE — Progress Notes (Signed)
Modified Barium Swallow Progress Note  Patient Details  Name: Ryan Day MRN: [id number] Date of Birth: Oct 03, 1950  Today's Date: 06/27/2020  Modified Barium Swallow completed.  Full report located under Chart Review in the Imaging Section.  Brief recommendations include the following:  Clinical Impression   Pt presents with severe oropharyngeal dysphagia characterized by silent aspiration and severe pharyngeal residue/stasis. Today's study may have been impacted by pt's cognitive function and level of arousal; he was initially engaged and responsive to SLP and rad techs' questions, however right before first PO trial, pt demonstrated a fixed left gaze and eventually closed his eyes. He required Max A multimodal stimulation to achieve focused attention on SLP and respond "yes" to questions about readiness for POs. He stayed awake throughout subsequent trials, but remained lethargic and minimally verbal with therapist. Pt exhibited very weak and prolonged lingual manipulation, lingual pumping, and reduced AP transit of puree boluses, in addition to reduced base of tongue retraction. Swallow initiation mostly occurred at the level of the pyriform sinuses; severe residue of puree remained in both vallecular and pyriform sinuses. He intermittently followed verbal command to perform extra dry swallow X1, however it was not effective to clear considerable residue. Nectar and thin barium were both silently aspirated (PAS score 8) either prior to or during the swallow. Suspect puree residue in pyriforms was also aspirated. Given results of today's study, SLP would recommend pt continue NPO with alternative means of nutrition and medication administration due to severe aspiration risk as well as risk factors that would increase his potential to develop aspiration pneumonia at this time. Pt would likely benefit from exercises for pharyngeal and respiratory muscle strengthening/voice and breath support  interventions. Would also still recommend conservative PO trials after oral care with SLP only to assess readiness for repeat MBSS.   Swallow Evaluation Recommendations       SLP Diet Recommendations: NPO       Medication Administration: Via alternative means               Oral Care Recommendations: Oral care QID   Other Recommendations: Remove water pitcher;Have oral suction available    Kenyada Hy E Mitzi Lilja 06/27/2020,10:30 AM

## 2020-06-27 NOTE — Progress Notes (Signed)
Occupational Therapy Session Note  Patient Details  Name: Ryan Day MRN: [id number] Date of Birth: 1951-06-18  Today's Date: 06/27/2020 OT Individual Time: 1330-1430 OT Individual Time Calculation (min): 60 min    Short Term Goals: Week 1:  OT Short Term Goal 1 (Week 1): Pt will maintain static sitting balnace wiht MAX A of 1 caregiver for 3 min at EOB/EOM OT Short Term Goal 2 (Week 1): Pt will direct functional transfers with max VC OT Short Term Goal 3 (Week 1): Pt will roll B in bed with MOD +2 A  Skilled Therapeutic Interventions/Progress Updates:    Pt resting in TIS w/c upon arrival with wife present.  Ace wraps and Ted hose on BLE along with abdominal binder. BP 121/63 HR 63 no c/o dizziness. BUE PROM to maintain joint mobility. Slight edema noted in B hands but improvement noted with retrograde massage and PROM of digits and wrist. Pt able to elevate B shoulders and corner of soft call bell placed on edge of C collar.  Pt able to activate soft all bell with shoulder elevation.  Pt provided with 6 ice chips per Water Protocol.  Out of Bed Monitoring chart placed on bulletin board and nursing notified. Discussed with PT clinical specialist the possibility of getting a Kreg bed for pt. OT goals discussed with pt. Pt comfortable in w/c and remained in w/c with NT and nursing student present.   Therapy Documentation Precautions:  Precautions Precautions: Cervical Precaution Booklet Issued: No Precaution Comments: C collar on when egde of bed, upright or OOB Required Braces or Orthoses: Cervical Brace Cervical Brace: Hard collar, Other (comment) (C collar when edge of bed, upright or OOB) Restrictions Weight Bearing Restrictions: No Pain: Pain Assessment Pain Scale: 0-10 Pain Score: 0-No pain   Therapy/Group: Individual Therapy  [name] 06/27/2020, 2:38 PM

## 2020-06-28 ENCOUNTER — Inpatient Hospital Stay (HOSPITAL_COMMUNITY): Payer: BC Managed Care – PPO | Admitting: Physical Therapy

## 2020-06-28 ENCOUNTER — Inpatient Hospital Stay (HOSPITAL_COMMUNITY): Payer: BC Managed Care – PPO | Admitting: Occupational Therapy

## 2020-06-28 ENCOUNTER — Inpatient Hospital Stay (HOSPITAL_COMMUNITY): Payer: BC Managed Care – PPO

## 2020-06-28 LAB — GLUCOSE, CAPILLARY
Glucose-Capillary: 116 mg/dL — ABNORMAL HIGH (ref 70–99)
Glucose-Capillary: 123 mg/dL — ABNORMAL HIGH (ref 70–99)
Glucose-Capillary: 134 mg/dL — ABNORMAL HIGH (ref 70–99)
Glucose-Capillary: 146 mg/dL — ABNORMAL HIGH (ref 70–99)
Glucose-Capillary: 147 mg/dL — ABNORMAL HIGH (ref 70–99)
Glucose-Capillary: 152 mg/dL — ABNORMAL HIGH (ref 70–99)
Glucose-Capillary: 158 mg/dL — ABNORMAL HIGH (ref 70–99)

## 2020-06-28 LAB — BASIC METABOLIC PANEL
Anion gap: 11 (ref 5–15)
BUN: 37 mg/dL — ABNORMAL HIGH (ref 8–23)
CO2: 28 mmol/L (ref 22–32)
Calcium: 9.7 mg/dL (ref 8.9–10.3)
Chloride: 112 mmol/L — ABNORMAL HIGH (ref 98–111)
Creatinine, Ser: 0.87 mg/dL (ref 0.61–1.24)
GFR calc non Af Amer: >60 mL/min (ref >60)
Glucose, Bld: 140 mg/dL — ABNORMAL HIGH (ref 70–99)
Potassium: 3.7 mmol/L (ref 3.5–5.1)
Sodium: 151 mmol/L — ABNORMAL HIGH (ref 135–145)

## 2020-06-28 IMAGING — DX DG ABD PORTABLE 1V
1 series · 1 of 1 positions shown · non-contrast
Comparison: None.

CLINICAL DATA: Follow-up examination

EXAM:
PORTABLE ABDOMEN - 1 VIEW

[abdomen kub]
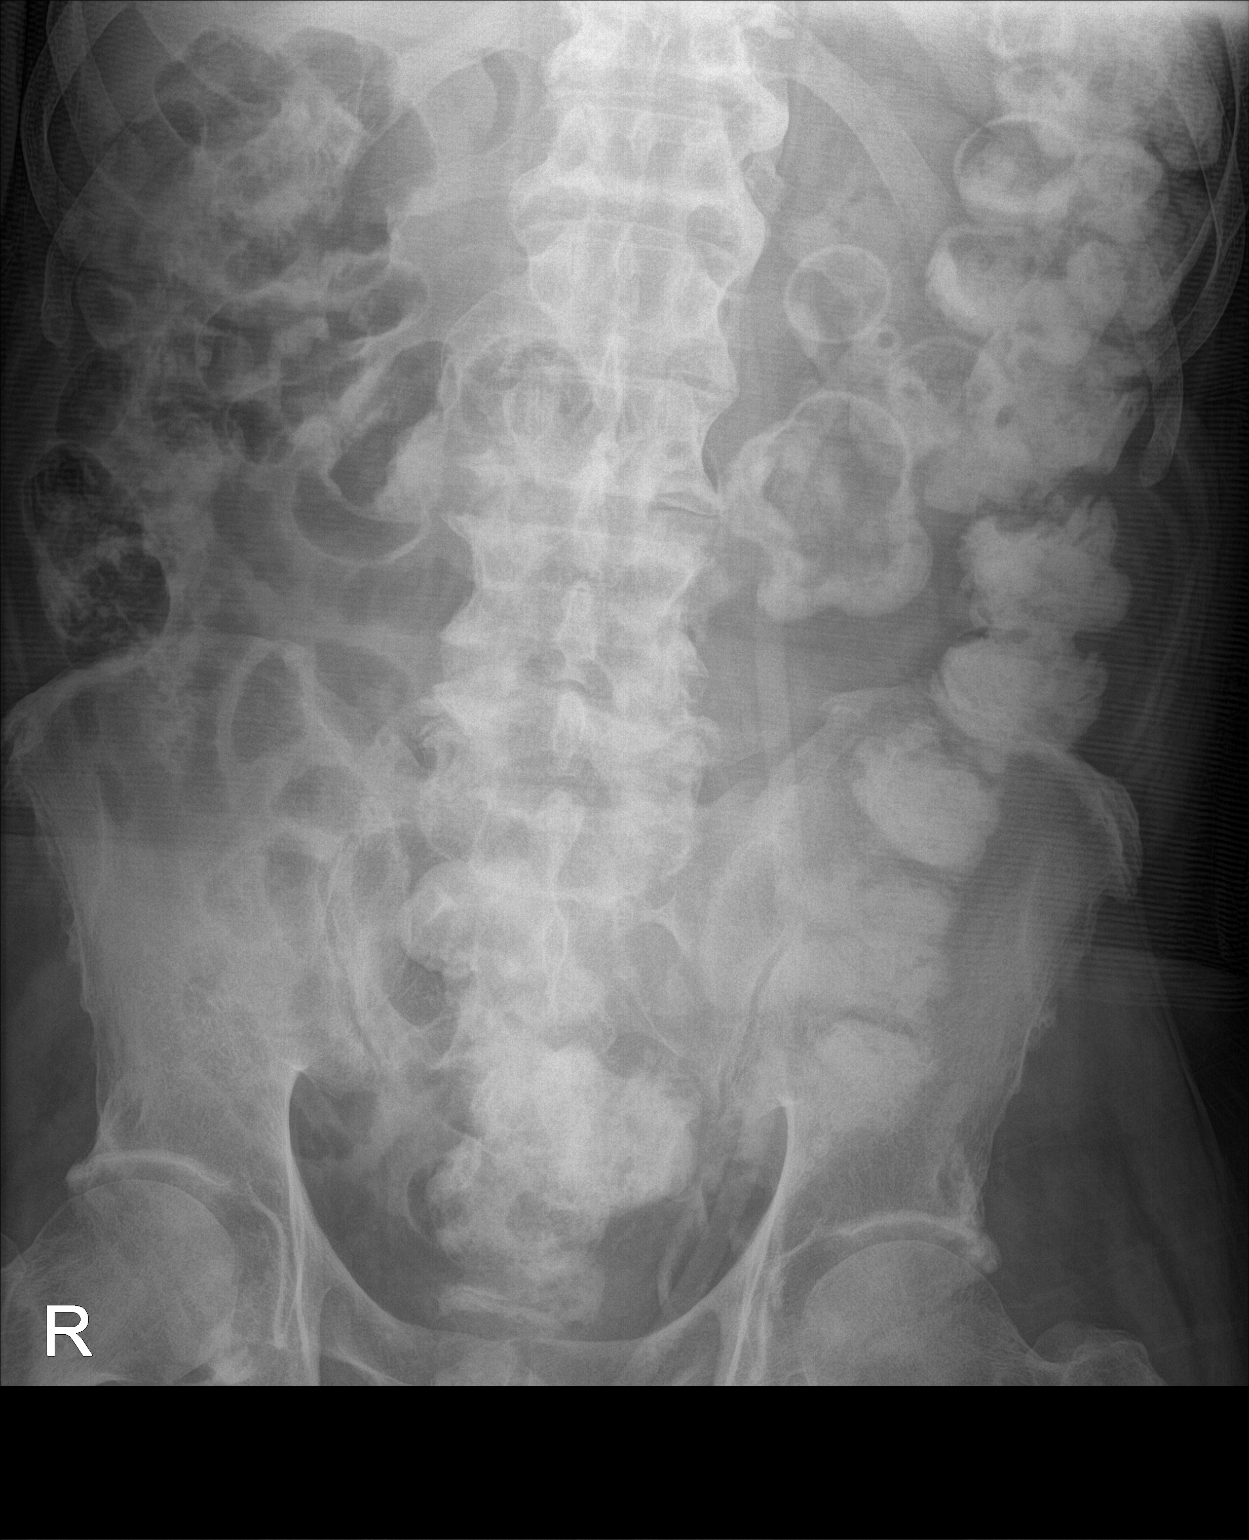

[1 of 1 positions shown; findings below may reference images not displayed]

FINDINGS: Hyperdense material is seen within the distal colon and rectal
vault, likely representing material administered during modified
barium swallow examination performed [DATE]. Normal abdominal
gas pattern. No gross free intraperitoneal gas. Gastrostomy catheter
overlies the left upper quadrant of the abdomen.
IMPRESSION: Normal abdominal gas pattern. Previously administered oral contrast
is now seen within the distal colon and rectum.

## 2020-06-28 MED ORDER — COLLAGENASE 250 UNIT/GM EX OINT
TOPICAL_OINTMENT | Freq: Every day | CUTANEOUS | Status: DC
  Filled 2020-06-28 (×2): qty 30

## 2020-06-28 MED ORDER — BISACODYL 10 MG RE SUPP
10.0000 mg | Freq: Every day | RECTAL | Status: DC
  Administered 2020-06-28 – 2020-07-19 (×22): 10 mg via RECTAL
  Filled 2020-06-28 (×22): qty 1

## 2020-06-28 NOTE — Progress Notes (Signed)
Patient having frequent incontinent loose/liquid stools. Currently on continuous tube feeds--will consult dietician to assist with transition to bolus tube feeds with fiber to help bulk stools and decrease frequent stools.  Will hold senna and order KUB to check stool burden.   Caught up with patient's wife who appeared distraught due to feelings of guilt. She has been there for patient since his injury (daughters have provided respite a couple of times in the past 8 weeks). She is extremely fatigued and nearly in tears as she is in need of respite. Advised asking her daughters to take turns visiting/keeping patient company on their days off.  Also discussed patient briefly with Dr. Sima Matas regarding need for evaluation and support.

## 2020-06-28 NOTE — Progress Notes (Signed)
Patient had another bowel movement & foam dressings had to be changed again. Wounds were measured at that time & the coccyx wound looked different. The foam pulled off the brownish covering. It looks more like a stage 2 - 3. There is still some yellowish material left but much more of the wound bed can be visualized & it has granulation tissue. [place] nurses are scheduled to assess his wounds today.Wounds were cleansed & another foam dressing was placed. No acute distress noted. Will continue to monitor for changes & report off to the oncoming nurse.

## 2020-06-28 NOTE — Progress Notes (Signed)
Occupational Therapy Session Note  Patient Details  Name: Ryan Day MRN: [id number] Date of Birth: July 28, 1951  Today's Date: 06/28/2020 OT Individual Time: 1300-1425 OT Individual Time Calculation (min): 85 min    Short Term Goals: Week 1:  OT Short Term Goal 1 (Week 1): Pt will maintain static sitting balnace wiht MAX A of 1 caregiver for 3 min at EOB/EOM OT Short Term Goal 2 (Week 1): Pt will direct functional transfers with max VC OT Short Term Goal 3 (Week 1): Pt will roll B in bed with MOD +2 A  Skilled Therapeutic Interventions/Progress Updates:    Pt resting in TIS w/c upon arrival with wife present.  Kreg bed delivered during session. Pt and wife educated on purpose and functions of bed, including chair and standing. Pt' wife educated on SCI and Resource book reviewed with wife.  Call bell repositioned so pt able to activate. Pt declined returning to bed. BUE PROM performed. Pt able to activate shoulder elevation. Educated pt and wife on importance of stretching and PROM. Pt's L wrist with limited extension. Educated wife and pt on importance of bowel program. Suction swapped out for new suction and yonker. Pt remained in w/c with call bell in place and wife present. RN attending pt also.   Therapy Documentation Precautions:  Precautions Precautions: Cervical Precaution Booklet Issued: No Precaution Comments: C collar on when egde of bed, upright or OOB Required Braces or Orthoses: Cervical Brace Cervical Brace: Hard collar, Other (comment) (C collar when edge of bed, upright or OOB) Restrictions Weight Bearing Restrictions: No Pain:  Pt with no s/s of pain   Therapy/Group: Individual Therapy  [name] 06/28/2020, 2:27 PM

## 2020-06-28 NOTE — Progress Notes (Signed)
Physical Therapy Session Note  Patient Details  Name: Ryan Day MRN: [id number] Date of Birth: 1951-07-24  Today's Date: 06/28/2020 PT Individual Time: 0800-0900; 6606-3016 PT Individual Time Calculation (min): 60 min and 40 min  Short Term Goals: Week 1:  PT Short Term Goal 1 (Week 1): Pt will begin to instruct caregiver/staff in bed mobility and transfers. PT Short Term Goal 2 (Week 1): Pt will increase sitting tolerance on edge of bed to 30 minutes assist with ADL/mobility PT Short Term Goal 3 (Week 1): Pt will increase sitting balance on edge of bed to mod to max A to assist with ADLs/mobility PT Short Term Goal 4 (Week 1): Pt will propel power w/c on level surfaces with S and verbal cues.  Skilled Therapeutic Interventions/Progress Updates:    Session 1: Pt received seated in bed, agreeable to PT session. No complaints of pain this AM. Assisted pt with donning thigh-high TEDs, ACE wrap, and abdominal binder at bed level. Pt found to be incontinent of BM, dependent for pericare and brief change. Pt is dependent x 2 for rolling R/L for donning of pants. Assisted pt with changing into a clean gown at bed level. Rolling L/R with assist x 2 for placement of maxi sky sling. Maxi sky transfer to TIS. While seated upright in chair BP 77/54 and pt does report feeling dizzy. Tilted chair back into reclined position. Once in reclined position pt reports improvement in dizziness, BP 134/78. Pt left semi-reclined in TIS w/c in room with needs in reach, wife present at end of session.  Session 2: Pt received seated in TIS w/c in room, agreeable to PT session. No complaints of pain. Pt reports he has been sitting up in chair x 2 hours. Reviewed pressure relief schedule and demonstration of pressure relief while seated in TIS chair. Will continue to reiterate importance of pressure relief being performed every 30 min x 2 min while up in chair. Semi-reclined BP 113/68. Pt requesting to return to bed this  session. Maxi sky transfer w/c to bed dependently. Pt requires assist x 2 for rolling R/L for removal of maxi sky sling and placement of pad on bed. Also discussed w/c mobility and that pt will require use of a PWC upon d/c for independence with mobility and pressure relief while seated. Pt open to idea of using PWC and have scheduled evaluation for PWC and loaner chair for next week. Pt left seated in bed with needs in reach, nursing in room at end of session.  Therapy Documentation Precautions:  Precautions Precautions: Cervical Precaution Booklet Issued: No Precaution Comments: C collar on when egde of bed, upright or OOB Required Braces or Orthoses: Cervical Brace Cervical Brace: Hard collar, Other (comment) (C collar when edge of bed, upright or OOB) Restrictions Weight Bearing Restrictions: No   Therapy/Group: Individual Therapy   Excell Seltzer, PT, DPT  06/28/2020, 12:24 PM

## 2020-06-28 NOTE — Consult Note (Signed)
[facility] Nurse Consult Note: Patient care given in room [facility] [phone] Reason for Consult: Sacral wound Wound type: Healing stage 3/unstagable  Pressure Injury POA: Yes Measurement:  Healing stage 3: 9.5 cm x 5 cm, red and moist, extends across bilateral buttocks and sacrum Unstagable: 3.5 cm x 2 cm black in the upper left area. Drainage (amount, consistency, odor) None Dressing procedure/placement/frequency:   Apply Santyl to upper left area of the buttocks only (black area) in a nickel thick layer. Cover with a saline moistened gauze, then sacral foam dressing.  Change daily.  Monitor the wound area(s) for worsening of condition such as: Signs/symptoms of infection, increase in size, development of or worsening of odor, development of pain, or increased pain at the affected locations.   Notify the medical team if any of these develop.  Thank you for the consult. [facility] nurse will not follow at this time.   Please re-consult the [facility] team if needed.  Cathlean Marseilles Tamala Julian, MSN, RN, [place], Lysle Pearl, [facility] Wound Treatment Associate Pager [phone]

## 2020-06-28 NOTE — Progress Notes (Signed)
Speech Language Pathology Daily Session Note  Patient Details  Name: Ryan Day MRN: [id number] Date of Birth: 09/18/1951  Today's Date: 06/28/2020 SLP Individual Time: 0930-1005 SLP Individual Time Calculation (min): 35 min  Short Term Goals: Week 1: SLP Short Term Goal 1 (Week 1): Pt will consume trials of thin liquids (ice chips) with vital signs remaining WFL prior to repeat instrumental assessment. SLP Short Term Goal 2 (Week 1): Pt will participate in instrumental swallow assessment. SLP Short Term Goal 3 (Week 1): Pt will perform pharyngeal strength and RMT exercises to improve vocal intensity and swallow function. SLP Short Term Goal 4 (Week 1): Pt will increase vocal intensity at the phrase level to 70% intelligibility with min A verbal cues to clarify communication.  Skilled Therapeutic Interventions: Skilled ST services focused on education and cognitive skills. [name] Ryan Day) was present for treatment session and SLP provided education pertaining to yesterday's MBS results and current swallow deficits. Ryan Day supports pt appearing to be at cognitive baseline, however has periods where he appeared to lack comprehension/recall for a brief moment. SLP will continue to monitor cognitive skills and assess need for cognitive treatment, but at this time will focus on dysphagia and voice production deficits. Pt was unable to return effective demonstration of EMST set at 7cm H2O, however was able to effectively complete with resistance set at 5cm H2O. Pt noted slight light headedness following 25 repetitions, with a self-preceived effortful score of 7 out 10. SLP recommended reduction to 15 repetitions x3 a day. Pt and Ryan Day stated agreement. SLP began to instruct pt in swallow exercises that will be utilized to improve swallow prior to repeat instrumental assessment, including Mendelson (completed x1), effortful swallow (completed x2) and falsetto glides ("ah" and "eh" completed every x1.) SLP  will focus upcoming sessions on swallow exercises and improvement on vocal intensity in hopes of being able to use voice controled assistive device in the future. Pt was left in room with wife, call bell within reach and chair alarm set. SLP recommends to continue skilled services.     Pain Pain Assessment Pain Scale: 0-10 Pain Score: 0-No pain  Therapy/Group: Individual Therapy  Ryan Day  [facility] 06/28/2020, 10:28 AM

## 2020-06-29 ENCOUNTER — Inpatient Hospital Stay (HOSPITAL_COMMUNITY): Payer: BC Managed Care – PPO | Admitting: Physical Therapy

## 2020-06-29 ENCOUNTER — Inpatient Hospital Stay (HOSPITAL_COMMUNITY): Payer: BC Managed Care – PPO | Admitting: Speech Pathology

## 2020-06-29 ENCOUNTER — Inpatient Hospital Stay (HOSPITAL_COMMUNITY): Payer: BC Managed Care – PPO

## 2020-06-29 DIAGNOSIS — S14109S Unspecified injury at unspecified level of cervical spinal cord, sequela: Secondary | ICD-10-CM | POA: Diagnosis not present

## 2020-06-29 LAB — GLUCOSE, CAPILLARY
Glucose-Capillary: 133 mg/dL — ABNORMAL HIGH (ref 70–99)
Glucose-Capillary: 126 mg/dL — ABNORMAL HIGH (ref 70–99)
Glucose-Capillary: 108 mg/dL — ABNORMAL HIGH (ref 70–99)
Glucose-Capillary: 96 mg/dL (ref 70–99)
Glucose-Capillary: 116 mg/dL — ABNORMAL HIGH (ref 70–99)

## 2020-06-29 MED ORDER — FLUDROCORTISONE 0.1 MG/ML ORAL SUSPENSION
0.1000 mg | Freq: Every day | ORAL | Status: DC
  Administered 2020-06-29 – 2020-07-04 (×6): 0.1 mg
  Filled 2020-06-29 (×8): qty 1

## 2020-06-29 MED ORDER — FREE WATER: 300.0000 mL | Status: DC

## 2020-06-29 MED ORDER — PROSOURCE TF PO LIQD: 45.0000 mL | Freq: Two times a day (BID) | ORAL | Status: DC

## 2020-06-29 MED ORDER — PROSOURCE TF PO LIQD
45.0000 mL | Freq: Three times a day (TID) | ORAL | Status: DC
  Administered 2020-06-29 – 2020-07-02 (×9): 45 mL
  Filled 2020-06-29 (×9): qty 45

## 2020-06-29 MED ORDER — SENNA 8.6 MG PO TABS
1.0000 | ORAL_TABLET | Freq: Every day | ORAL | Status: DC
Start: 2020-06-29 — End: 2020-06-29

## 2020-06-29 MED ORDER — NEPRO/CARBSTEADY PO LIQD
355.0000 mL | Freq: Three times a day (TID) | ORAL | Status: DC
  Administered 2020-06-29: 237 mL
  Administered 2020-06-29: 120 mL
  Administered 2020-06-29 – 2020-07-02 (×11): 355 mL
  Filled 2020-06-29: qty 474

## 2020-06-29 MED ORDER — SENNOSIDES 8.8 MG/5ML PO SYRP
5.0000 mL | ORAL_SOLUTION | Freq: Every day | ORAL | Status: DC
  Administered 2020-06-29 – 2020-07-11 (×9): 5 mL
  Filled 2020-06-29 (×13): qty 5

## 2020-06-29 MED ORDER — FREE WATER
200.0000 mL | Status: DC
  Administered 2020-06-29 – 2020-06-30 (×8): 200 mL

## 2020-06-29 MED ORDER — JEVITY 1.5 CAL/FIBER PO LIQD
474.0000 mL | Freq: Three times a day (TID) | ORAL | Status: DC
  Filled 2020-06-29: qty 474

## 2020-06-29 NOTE — Progress Notes (Signed)
Speech Language Pathology Daily Session Note  Patient Details  Name: Ryan Day MRN: [id number] Date of Birth: Apr 03, 1951  Today's Date: 06/29/2020 SLP Individual Time: 0800-0900 SLP Individual Time Calculation (min): 60 min  Short Term Goals: Week 1: SLP Short Term Goal 1 (Week 1): Pt will consume trials of thin liquids (ice chips) with vital signs remaining WFL prior to repeat instrumental assessment. SLP Short Term Goal 2 (Week 1): Pt will participate in instrumental swallow assessment. SLP Short Term Goal 3 (Week 1): Pt will perform pharyngeal strength and RMT exercises to improve vocal intensity and swallow function. SLP Short Term Goal 4 (Week 1): Pt will increase vocal intensity at the phrase level to 70% intelligibility with min A verbal cues to clarify communication.  Skilled Therapeutic Interventions:   Patient seen for skilled ST session focusing on swallow and speech/voice. He was able to tolerate increasing EMST to 6.5 from 5 without change in reported effort but was observed to have decreased output after the first 5-6 repetitions. After oral care, he tolerated ice chips (approximately 4-5 ounces) without coughing but did exhibit a delayed throat clear and expectoration (via oral suction) of what appeared to be saliva mixed with water. Throat clearing and congested voice is consistent with MBS report of pyriform sinus residuals leading to silent penetration and aspiration after swallow. Throat clearing and oral suction (patient mod I with this) was successful to clear suspected pharyngeal residuals with ice chips.  He performed Masako maneuver after SLP demonstration but did not achieve full pharyngeal contraction or laryngeal elevation. He participated in vocal exercises for "ah", "ee" "oo" with focus on increasing vocal intensity as well as sustained phonation. Currently, he is only able to sustain phonation for approximately 1 second. He was able to achieve adequate vocal adduction  intermittently. Patient continues to benefit from skilled SLP intervention to maximize speech, voice, swallow function prior to discharge.  Pain Pain Assessment Pain Scale: 0-10 Pain Score: 0-No pain  Therapy/Group: Individual Therapy  Sonia Baller, MA, CCC-SLP 06/29/20 12:49 PM

## 2020-06-29 NOTE — Progress Notes (Signed)
Nutrition Follow-up  DOCUMENTATION CODES:   Not applicable  INTERVENTION:   Transition to bolus tube feeding regimen viaPEG: - 355 ml (1.5 cartons) of Nepro formula QID (goal is total of 6 cartons daily)  First bolus: 118 ml (half of a carton)  Second bolus: 237 ml (1 full carton)  Third bolus and goal: 355 ml (1.5 cartons) - ProSource 45 ml TID per tube  Bolus tube feeding regimen at goal provides2680kcal, 148 grams of protein, and 1089m free water daily.  - Continue Juven BIDvia tube, each packet provides 80 calories, 8 grams of carbohydrate, 2.5 grams of protein, 7 grams of L-arginine and 7 grams of L-glutamine; supplement contains CaHMB, vitamins C, E, B12 and Zinc to promote wound healing.  - Free water flushes of 200 ml every 4 hours for a total of 1200 ml additional free water (2234 ml total with TF + flushes).  - MVI daily per tube  NUTRITION DIAGNOSIS:   Increased nutrient needs related to wound healing as evidenced by estimated needs.  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 Hospitalization complicated by PTX, C diff colitis, PEA, respiratory distress, aspiration PNA, AKI. Also required [facility]ach and PEG[facility].  RD consulted to change tube feeds to bolus regimen and to change formula to one with fiber. Spoke with pt and wife at bedside and explained plan. Also discussed with RN.  Medications reviewed and include: vitamin C, dulcolax, pepcid, SSI q 4 hours, liquid MVI,  Juven, florastor, zinc sulfate  Labs reviewed: sodium 151, BUN 37 CBG's: 116-158 x 24 hours  UOP: 2300 ml x 24 hours  Diet Order:   Diet Order            Diet NPO time specified  Diet effective now                 EDUCATION NEEDS:   Not appropriate for education at this time  Skin:  Skin Assessment: Skin Integrity Issues: Stage III: right buttock, left buttock Unstageable: coccyx  Last BM:  06/29/20 type 6  Height:   Ht Readings from Last 1 Encounters:  06/23/20 '6\' 5"'  (1.956 m)    Weight:   Wt Readings from Last 1 Encounters:  06/23/20 105.2 kg    Ideal Body Weight:  94.5 kg  BMI:  Body mass index is 27.5 kg/m.  Estimated Nutritional Needs:   Kcal:  2600-2800  Protein:  145-165 gm  Fluid:  >/= 2.4 L    KGaynell Face MS, RD, LDN Inpatient Clinical Dietitian Please see AMiON for contact information.

## 2020-06-29 NOTE — Progress Notes (Signed)
[place] PHYSICAL MEDICINE & REHABILITATION PROGRESS NOTE   Subjective/Complaints:   Had BP drop to 17P systolic yesterday with ACE wraps and abd binder and midodrine- will add Florinef as well.   No dizzy currently- sitting up in bed- doing SLP.   Denies much pain.  Wife coming in this AM after 10am per pt.   ROS:   Pt denies SOB, abd pain, CP, N/V/C/D, and vision changes  Objective:   DG Abd Portable 1V  Result Date: 06/28/2020 CLINICAL DATA:  Follow-up examination EXAM: PORTABLE ABDOMEN - 1 VIEW COMPARISON:  None. FINDINGS: Hyperdense material is seen within the distal colon and rectal vault, likely representing material administered during modified barium swallow examination performed 06/27/2020. Normal abdominal gas pattern. No gross free intraperitoneal gas. Gastrostomy catheter overlies the left upper quadrant of the abdomen. IMPRESSION: Normal abdominal gas pattern. Previously administered oral contrast is now seen within the distal colon and rectum. Electronically Signed   By: Fidela Salisbury MD   On: 06/28/2020 19:27   DG Swallowing Func-Speech Pathology  Result Date: 06/27/2020 Objective Swallowing Evaluation: Type of Study: MBS-Modified Barium Swallow Study  Patient Details Name: Ryan Day MRN: [id number] Date of Birth: 28-Dec-1950 Today's Date: 06/27/2020 Past Medical History: Past Medical History: Diagnosis Date . Acute on chronic respiratory failure with hypoxia ([place])  . Acute renal injury due to hypovolemia ([place])  . Autonomic instability  . Cardiac arrest ([place])  . Cervical spinal cord injury, sequela ([place])  . Elevated alkaline phosphatase level  . 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 Hyperlipidemia  . Hypertension  Past Surgical History: Past Surgical History: Procedure Laterality Date . COLONOSCOPY WITH PROPOFOL N/A 12/05/2017  Procedure: COLONOSCOPY WITH PROPOFOL;  Surgeon: Lin Landsman, MD;  Location: [facility] ENDOSCOPY;  Service: Gastroenterology;   Laterality: N/A; . ESOPHAGOGASTRODUODENOSCOPY  12/05/2017  Procedure: ESOPHAGOGASTRODUODENOSCOPY (EGD);  Surgeon: Lin Landsman, MD;  Location: [facility] ENDOSCOPY;  Service: Gastroenterology;; HPI: Ryan Day is a 69 year old male with history of HTN otherwise in relatively good health who was admitted to [facility] on 04/26/2020 after fall, striking his head and face with subsequent qualdraplegia. He was found to have C5 vertebral body extension type teardrop fracture, C3-C6 cord compression with edema and was taken to OR for C3-C6 laminectomy with fusion and repair of small dural tear X 2 by Dr. Macario Carls on the same day. Post op course significant for spinal shock with hypotension and bradycardia, post procedural PTX,  C-diff colitis, significant secretions requiring multiple bronchoscopies due to mucous plugging and ultimately required PEG/Trach on 05/04/20. Hospital course further complicated by respiratory distress with PEA and and ROSC achieved after 8 minutes. Aspiration PNA and intermittent fevers treated, AKI/acute on chronic anemia/abnormal LFTs resolving and he was tolerating PMSV trials.  He was discharged to [facility] on 06/07/20 for management of acute on chronic respiratory failure and for therapy. He has had intermittent issues with pre-renal azotemia and hypernatremia--Na-149 today and started on IV dextrose. Secretions have improved with addition of scopolamine patch, he was downsized to CFS#6  and he is tolerating button plugging. He was found to have E-coli UTI at admission and treated with 7 day course of IV rocephin. CXR done 09/17 due to ongoing secretions and showed right infrahilar PNA which was treated with course of doxycycline. He was made NPO as FEES revealed penetration of nectar to vallecula with difficulty clearing residue and eventual aspiration. Multiple wound on sacrum have evolved and being managed with local measures.  Per  RT- no desaturations or mucous plugging during his stay but pulmonary  recommends evaluation on outpatient basis prior to decannulation due to weakness.  Therapy has been ongoing with patient showing improvement in activity tolerance and therapy team was recommending intensive rehab program.  He has a supportive family who plan on providing assistance after discharge. He was felt to be a good CIR candidate.  Assessment / Plan / Recommendation CHL IP CLINICAL IMPRESSIONS 06/27/2020 Clinical Impression -- Pt presents with severe oropharyngeal dysphagia characterized by silent aspiration and severe pharyngeal residue/stasis. Today's study may have been impacted by pt's cognitive function and level of arousal; he was initially engaged and responsive to SLP and rad techs' questions, however right before first PO trial, pt demonstrated a fixed left gaze and eventually closed his eyes. He required Max A multimodal stimulation to achieve focused attention on SLP and respond "yes" to questions about readiness for POs. He stayed awake throughout subsequent trials, but remained lethargic and minimally verbal with therapist. Pt exhibited very weak and prolonged lingual manipulation, lingual pumping, and reduced AP transit of puree boluses, in addition to reduced base of tongue retraction. Swallow initiation mostly occurred at the level of the pyriform sinuses; severe residue of puree remained in both vallecular and pyriform sinuses. He intermittently followed verbal command to perform extra dry swallow X1, however it was not effective to clear considerable residue. Nectar and thin barium were both silently aspirated (PAS score 8) either prior to or during the swallow. Suspect puree residue in pyriforms was also aspirated. Given results of today's study, SLP would recommend pt continue NPO with alternative means of nutrition and medication administration due to severe aspiration risk as well as risk factors that would increase his potential to develop aspiration pneumonia at this time. Pt would  likely benefit from exercises for pharyngeal and respiratory muscle strengthening/voice and breath support interventions. Would also still recommend conservative PO trials after oral care with SLP only to assess readiness for repeat MBSS. SLP Visit Diagnosis Dysphagia, oropharyngeal phase (R13.12) Attention and concentration deficit following -- Frontal lobe and executive function deficit following -- Impact on safety and function Severe aspiration risk;Risk for inadequate nutrition/hydration     CHL IP DIET RECOMMENDATION 06/27/2020 SLP Diet Recommendations NPO Liquid Administration via -- Medication Administration Via alternative means Compensations -- Postural Changes --   CHL IP OTHER RECOMMENDATIONS 06/27/2020 Recommended Consults -- Oral Care Recommendations Oral care QID Other Recommendations Remove water pitcher;Have oral suction available   No flowsheet data found.  No flowsheet data found.     CHL IP ORAL PHASE 06/27/2020 Oral Phase Impaired Oral - Pudding Teaspoon -- Oral - Pudding Cup -- Oral - Honey Teaspoon -- Oral - Honey Cup -- Oral - Nectar Teaspoon -- Oral - Nectar Cup -- Oral - Nectar Straw Delayed oral transit;Lingual pumping;Weak lingual manipulation Oral - Thin Teaspoon -- Oral - Thin Cup -- Oral - Thin Straw Weak lingual manipulation;Decreased bolus cohesion Oral - Puree Decreased bolus cohesion;Weak lingual manipulation Oral - Mech Soft -- Oral - Regular -- Oral - Multi-Consistency -- Oral - Pill -- Oral Phase - Comment --  CHL IP PHARYNGEAL PHASE 06/27/2020 Pharyngeal Phase Impaired Pharyngeal- Pudding Teaspoon -- Pharyngeal -- Pharyngeal- Pudding Cup -- Pharyngeal -- Pharyngeal- Honey Teaspoon -- Pharyngeal -- Pharyngeal- Honey Cup -- Pharyngeal -- Pharyngeal- Nectar Teaspoon -- Pharyngeal -- Pharyngeal- Nectar Cup -- Pharyngeal -- Pharyngeal- Nectar Straw Delayed swallow initiation-vallecula;Reduced airway/laryngeal closure;Reduced tongue base retraction;Penetration/Aspiration during  swallow;Moderate aspiration;Pharyngeal residue - valleculae;Pharyngeal residue -  pyriform;Pharyngeal residue - posterior pharnyx;Compensatory strategies attempted (with notebox) Pharyngeal Material enters airway, passes BELOW cords without attempt by patient to eject out (silent aspiration) Pharyngeal- Thin Teaspoon -- Pharyngeal -- Pharyngeal- Thin Cup -- Pharyngeal -- Pharyngeal- Thin Straw Delayed swallow initiation-pyriform sinuses;Reduced pharyngeal peristalsis;Penetration/Aspiration before swallow;Reduced airway/laryngeal closure;Pharyngeal residue - valleculae;Pharyngeal residue - pyriform;Pharyngeal residue - posterior pharnyx;Reduced tongue base retraction Pharyngeal Material enters airway, passes BELOW cords without attempt by patient to eject out (silent aspiration) Pharyngeal- Puree Reduced tongue base retraction;Penetration/Apiration after swallow;Delayed swallow initiation-pyriform sinuses;Reduced pharyngeal peristalsis;Pharyngeal residue - valleculae;Pharyngeal residue - pyriform;Pharyngeal residue - posterior pharnyx;Compensatory strategies attempted (with notebox) Pharyngeal Material enters airway, passes BELOW cords without attempt by patient to eject out (silent aspiration) Pharyngeal- Mechanical Soft -- Pharyngeal -- Pharyngeal- Regular -- Pharyngeal -- Pharyngeal- Multi-consistency -- Pharyngeal -- Pharyngeal- Pill -- Pharyngeal -- Pharyngeal Comment --  CHL IP CERVICAL ESOPHAGEAL PHASE 06/27/2020 Cervical Esophageal Phase WFL Pudding Teaspoon -- Pudding Cup -- Honey Teaspoon -- Honey Cup -- Nectar Teaspoon -- Nectar Cup -- Nectar Straw -- Thin Teaspoon -- Thin Cup -- Thin Straw -- Puree -- Mechanical Soft -- Regular -- Multi-consistency -- Pill -- Cervical Esophageal Comment -- Arbutus Leas 06/27/2020, 12:36 PM              No results for input(s): WBC, HGB, HCT, PLT in the last 72 hours. Recent Labs    06/28/20 0752  NA 151*  K 3.7  CL 112*  CO2 28  GLUCOSE 140*  BUN 37*  CREATININE  0.87  CALCIUM 9.7    Intake/Output Summary (Last 24 hours) at 06/29/2020 0857 Last data filed at 06/29/2020 0410 Gross per 24 hour  Intake 0 ml  Output 2300 ml  Net -2300 ml     Pressure Injury 06/23/20 Coccyx Medial;Left Unstageable - Full thickness tissue loss in which the base of the injury is covered by slough (yellow, tan, gray, green or brown) and/or eschar (tan, brown or black) in the wound bed. upper left sacrum is [name] (Active)  06/23/20 1710  Location: Coccyx  Location Orientation: Medial;Left  Staging: Unstageable - Full thickness tissue loss in which the base of the injury is covered by slough (yellow, tan, gray, green or brown) and/or eschar (tan, brown or black) in the wound bed.  Wound Description (Comments): upper left sacrum is unstageable and has a leathery covering over the wound bed  Present on Admission: Yes     Pressure Injury 06/23/20 Buttocks Right Stage 3 -  Full thickness tissue loss. Subcutaneous fat may be visible but bone, tendon or muscle are NOT exposed. healing stage 3 (Active)  06/23/20 1838  Location: Buttocks  Location Orientation: Right  Staging: Stage 3 -  Full thickness tissue loss. Subcutaneous fat may be visible but bone, tendon or muscle are NOT exposed.  Wound Description (Comments): healing stage 3  Present on Admission: Yes     Pressure Injury 06/23/20 Left Stage 3 -  Full thickness tissue loss. Subcutaneous fat may be visible but bone, tendon or muscle are NOT exposed. healing stage 3 (Active)  06/23/20 1839  Location:   Location Orientation: Left  Staging: Stage 3 -  Full thickness tissue loss. Subcutaneous fat may be visible but bone, tendon or muscle are NOT exposed.  Wound Description (Comments): healing stage 3  Present on Admission: Yes    Physical Exam: Vital Signs Blood pressure 123/67, pulse 72, temperature 98.1 F (36.7 C), temperature source Oral, resp. rate 16, height 6\' 5"  (1.956 m),  weight 105.2 kg, SpO2 98 %. General:  pt sitting up- SLP in room, NAD HEENT: trach capped- sats 98% still today Heart: RRR Chest: quad coughed pt- sounds better slightly coarse audibly, but CTA B/L Abdomen: Soft, NT, ND, (+)BS (+) PEG in place Extremities: No clubbing, cyanosis, or edema. Pulses are 2+ Skin: stage 2 pressure injuries on buttock and coccyx Neuro: Dysphonia (wife reports close to baseline).  Motor: B/l UE shoulder abduction 1+/5, distally 0/5 with ?trace flicker left wrist flexor B/l LE: 0/5 proximal to distal Sensation diminished to light touch in all extremities Psych: quiet- flat affect     Assessment/Plan: 1. 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  Physiatrist is providing close team supervision and 24 hour management of active medical problems listed below.  Physiatrist and rehab team continue to assess barriers to discharge/monitor patient progress toward functional and medical goals  Care Tool:  Bathing              Bathing assist Assist Level: 2 Helpers     Upper Body Dressing/Undressing Upper body dressing   What is the patient wearing?: Pull over shirt    Upper body assist Assist Level: 2 Helpers    Lower Body Dressing/Undressing Lower body dressing      What is the patient wearing?: Incontinence brief     Lower body assist Assist for lower body dressing: 2 Helpers     Toileting Toileting    Toileting assist Assist for toileting: Dependent - Patient 0%     Transfers Chair/bed transfer  Transfers assist  Chair/bed transfer activity did not occur: Safety/medical concerns  Chair/bed transfer assist level: Dependent - mechanical lift     Locomotion Ambulation   Ambulation assist   Ambulation activity did not occur: Safety/medical concerns          Walk 10 feet activity   Assist           Walk 50 feet activity   Assist            Walk 150 feet activity   Assist           Walk 10 feet on uneven surface  activity   Assist           Wheelchair     Assist Will patient use wheelchair at discharge?: Yes Type of Wheelchair: Power Wheelchair activity did not occur: Safety/medical concerns         Wheelchair 50 feet with 2 turns activity    Assist    Wheelchair 50 feet with 2 turns activity did not occur: Safety/medical concerns       Wheelchair 150 feet activity     Assist  Wheelchair 150 feet activity did not occur: Safety/medical concerns       Blood pressure 123/67, pulse 72, temperature 98.1 F (36.7 C), temperature source Oral, resp. rate 16, height 6\' 5"  (1.956 m), weight 105.2 kg, SpO2 98 %.    Medical Problem List and Plan: 1.  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  Antithrombotics: -DVT/anticoagulation:  Pharmaceutical: Lovenox--check dopplers in am.  10/5- Dopplers (-)             -  antiplatelet therapy: N/A 3. Pain Management: N/A 4. Mood: LCSW to follow for evaluation and support.              -antipsychotic agents: N/A 5. Neuropsych: This patient is capable of making decisions on his own behalf. 6. Sacral decub/Skin/Wound Care: Pictures show wound in different stages of healing. Ordered Air mattress overlay. Continue vitamin and prostat to promote healing. Will add Juven also for collagen. Will increase tube feed to 50 cc/hr as likely has higher nutritional need. Will consult dietician to help address nutritional needs.   10/4- will consult dietician- BUN up to 54 from 27 and Cr 1.19 from <1- asked to evaluate dryness  10/5- haven't seen a dietician note- will check on this.  7. Fluids/Electrolytes/Nutrition: Monitor I/Os.CMP ordered. Transition to bolus tube feeds next week if stable.   10/5- very dry- have placed Dietician order- need  to help pt with BUN up to 54  10/7- Na 151- will ask Nutrition to see today and reassess 8. Orthostatic Hypotension: Secondary to autonomic dysfunction has resolved.              Monitor with increased activity, particularly for orthostasis.   10/3 well controlled   10/4- will get abd binder- cut a hole for PEG  10/5- will add Midodrine 5 mg TID with meals- due to BP 86N/ systolic  81/7- will add Florinef 0.1 mg daily- and titrate up as required 9. Acute on chronic anemia: Improving--continue to monitor. Added iron supplement.              CBC stable 10/2 10. Hypernatremia/Hyperglycemia: Due to intermittent IV dextrose. Added water flushes. May need low sodium formula.   10/4- Na 144- doing OK 11. Neurogenic bladder: Maintain foley due to sacral wounds.. 12. Neurogenic bowel:Continue senna in am with suppository in evenings for bowel program. Moving bowels regularly   10/3: Patient states he is moving his bowels regularly.  10/5- LBM last night with bowel program   10/7- going a lot- have asked nutrition to help bulk up stools 13. Secretions/Trach/capped: continue scopolamine patch  10/5- will teach staff about quad coughing-  14. Azotemia/NPO- getting TFs  10/4- consulted dietician  10/5- don't see note from nutrition- will check on this.   10/7- BUN down to 37 from 54- and Cr <1 now- doing better      LOS: 6 days A FACE TO FACE EVALUATION WAS PERFORMED  Linken Mcglothen 06/29/2020, 8:57 AM

## 2020-06-29 NOTE — Progress Notes (Signed)
Physical Therapy Session Note  Patient Details  Name: Ryan Day MRN: [id number] Date of Birth: 05/28/1951  Today's Date: 06/29/2020 PT Individual Time: 5670-1410 PT Individual Time Calculation (min): 59 min   Short Term Goals: Week 1:  PT Short Term Goal 1 (Week 1): Pt will begin to instruct caregiver/staff in bed mobility and transfers. PT Short Term Goal 2 (Week 1): Pt will increase sitting tolerance on edge of bed to 30 minutes assist with ADL/mobility PT Short Term Goal 3 (Week 1): Pt will increase sitting balance on edge of bed to mod to max A to assist with ADLs/mobility PT Short Term Goal 4 (Week 1): Pt will propel power w/c on level surfaces with S and verbal cues.  Skilled Therapeutic Interventions/Progress Updates:    pt received in bed and agreeable to therapy. Upon finding pt in bed, pt required brief change with BM, gown change, and nursing present for dressing change and to disconnect PEG tube. Abdominal binder placed in supine as well as cervical collar, dependently.  Pt required dependent x2 level of assist for rolling in bed x2 each R and L. Pt also dependent  for all positioning in bed. Pt directed in rolling one additional time each R and L for lift pad placement for maxi sky transfer to [place], dependent x2. Pt denied all Orthostatic symptoms, transferred to [facility], once in [facility] PT placed TED hose on BLE. BP monitored to be 90/55 in slightly reclined sitting position in TIS. PT also applied ace wraps to BLE. Pt directed in shoulder elevation x6 to attempt to give pt more control of pancake call light button however pt unable to activate call light, repositioned call light under cervical brace more securely then pt able to complete task. [name] also present and agreed to assist pt with call light needs. Pt and wife educated on skin integrity pressure relief time while in chair.   Therapy Documentation Precautions:  Precautions Precautions: Cervical Precaution Booklet Issued:  No Precaution Comments: C collar on when egde of bed, upright or OOB Required Braces or Orthoses: Cervical Brace Cervical Brace: Hard collar, Other (comment) (C collar when edge of bed, upright or OOB) Restrictions Weight Bearing Restrictions: No    Vital Signs: Therapy Vitals Pulse Rate: 70 Resp: 16 Oxygen Therapy SpO2: 98 % O2 Device: Room Air Pain: Pain Assessment Pain Scale: 0-10 Pain Score: 0-No pain    Therapy/Group: Individual Therapy  Junie Panning 06/29/2020, 3:38 PM

## 2020-06-29 NOTE — Progress Notes (Signed)
Patient ID: Ryan Day, male   DOB: 06-11-1951, 69 y.o.   MRN: [id number] Bowel program completed per order with dulcolax supp after dig stim. No stool noted in rectal vault. Digital stim and placed suppository. Pt left to allow suppository to work however little results noted from suppository and dig stim. Some brown fluid with stringy consistency but not mushy small stool. Tolerated procedure well. Margarito Liner

## 2020-06-29 NOTE — Progress Notes (Addendum)
Occupational Therapy Session Note  Patient Details  Name: Ryan Day MRN: [id number] Date of Birth: [birth year]  Today's Date: 06/29/2020 OT Individual Time: 1300-1410 OT Individual Time Calculation (min): 70 min    Short Term Goals: Week 1:  OT Short Term Goal 1 (Week 1): Pt will maintain static sitting balnace wiht MAX A of 1 caregiver for 3 min at EOB/EOM OT Short Term Goal 2 (Week 1): Pt will direct functional transfers with max VC OT Short Term Goal 3 (Week 1): Pt will roll B in bed with MOD +2 A  Skilled Therapeutic Interventions/Progress Updates:    Pt resting in TIS w/c upon arrival. RUE NMR to facilitate muscle activation (see below for NMES). Pt with significant B shoulder sublux-Kinesio tape applied. Puff call bell arrived and set up.  Pt able to activate and secured on [facility] for use in bed. Soft call bell repositioned on R shoulder, partially under C collar.  Pt able to activate. Primary RN [name]) educated on operation of pull call bell and placement of soft call bell when in w/c. Significant edema noted in B hands. Retrograde massage.  Kinesio tape to be applied tomorrow. Pt requrested ice chips.  6 ice chips provided per protocol. Pt remained in w/c.   1:1 NMES applied to  middle deltoid toand posterior deltoid to facilitate shoulder flexion Ratio 1:3 Rate 35 pps Waveform- Asymmetric Ramp 1.0 Pulse 300 Intensity- 16 Duration - 10    Report of pain at the beginning of session - none Report of pain at the end of session - none  Muscle activity not detected  No adverse reactions after treatment and is skin intact.   Therapy Documentation Precautions:  Precautions Precautions: Cervical Precaution Booklet Issued: No Precaution Comments: C collar on when egde of bed, upright or OOB Required Braces or Orthoses: Cervical Brace Cervical Brace: Hard collar, Other (comment) (C collar when edge of bed, upright or OOB) Restrictions Weight Bearing Restrictions:  No Pain: Pain Assessment Pain Scale: 0-10 Pain Score: 0-No pain   Therapy/Group: Individual Therapy  [name] 06/29/2020, 2:17 PM

## 2020-06-30 ENCOUNTER — Inpatient Hospital Stay (HOSPITAL_COMMUNITY): Payer: BC Managed Care – PPO

## 2020-06-30 ENCOUNTER — Inpatient Hospital Stay (HOSPITAL_COMMUNITY): Payer: BC Managed Care – PPO | Admitting: Physical Therapy

## 2020-06-30 DIAGNOSIS — S14109S Unspecified injury at unspecified level of cervical spinal cord, sequela: Secondary | ICD-10-CM | POA: Diagnosis not present

## 2020-06-30 LAB — BASIC METABOLIC PANEL
Anion gap: 10 (ref 5–15)
BUN: 46 mg/dL — ABNORMAL HIGH (ref 8–23)
CO2: 29 mmol/L (ref 22–32)
Calcium: 9.9 mg/dL (ref 8.9–10.3)
Chloride: 112 mmol/L — ABNORMAL HIGH (ref 98–111)
Creatinine, Ser: 0.87 mg/dL (ref 0.61–1.24)
GFR calc non Af Amer: >60 mL/min (ref >60)
Glucose, Bld: 97 mg/dL (ref 70–99)
Potassium: 3.6 mmol/L (ref 3.5–5.1)
Sodium: 151 mmol/L — ABNORMAL HIGH (ref 135–145)

## 2020-06-30 LAB — GLUCOSE, CAPILLARY
Glucose-Capillary: 122 mg/dL — ABNORMAL HIGH (ref 70–99)
Glucose-Capillary: 134 mg/dL — ABNORMAL HIGH (ref 70–99)
Glucose-Capillary: 135 mg/dL — ABNORMAL HIGH (ref 70–99)
Glucose-Capillary: 159 mg/dL — ABNORMAL HIGH (ref 70–99)
Glucose-Capillary: 78 mg/dL (ref 70–99)
Glucose-Capillary: 99 mg/dL (ref 70–99)

## 2020-06-30 MED ORDER — FREE WATER
250.0000 mL | Status: DC
  Administered 2020-06-30 – 2020-07-02 (×11): 250 mL

## 2020-06-30 NOTE — Progress Notes (Signed)
Occupational Therapy Weekly Progress Note  Patient Details  Name: Ryan Day MRN: [id number] Date of Birth: November 04, 1950  Beginning of progress report period: June 24, 2020 End of progress report period: June 30, 2020  Patient has met 0 of 3 short term goals.  Pt is making slow progress towards STG/LTGs. Pt requires +2 for sitting balance EOB and tot A+2 for rolling in bed. Pt currently cannot recall sequencing for preparing for transfer with MaxiSky. Pt able to activate soft call bell with shoulder shrugs when wearing C collar. Pt provided with puff activated call bell which he can operate when in bed without call bell. Pt with significant B shoulder sublux. Kinesio tape applied for pain management and positioning.   Patient continues to demonstrate the following deficits: muscle weakness and muscle paralysis, decreased cardiorespiratoy endurance, impaired timing and sequencing and unbalanced muscle activation and decreased sitting balance and decreased balance strategies and therefore will continue to benefit from skilled OT intervention to enhance overall performance with BADL and Reduce care partner burden.  Patient progressing toward long term goals..  Continue plan of care.  OT Short Term Goals Week 1:  OT Short Term Goal 1 (Week 1): Pt will maintain static sitting balnace wiht MAX A of 1 caregiver for 3 min at EOB/EOM OT Short Term Goal 1 - Progress (Week 1): Progressing toward goal OT Short Term Goal 2 (Week 1): Pt will direct functional transfers with max VC OT Short Term Goal 2 - Progress (Week 1): Progressing toward goal OT Short Term Goal 3 (Week 1): Pt will roll B in bed with MOD +2 A OT Short Term Goal 3 - Progress (Week 1): Progressing toward goal Week 2:  OT Short Term Goal 1 (Week 2): Pt will maintain static sitting balnace wiht MAX A of 1 caregiver for 3 min at EOB/EOM OT Short Term Goal 2 (Week 2): Pt will direct functional transfers with max VC OT Short Term Goal 3 (Week  2): Pt will roll B in bed with MOD +2 A   [name] 06/30/2020, 8:14 AM

## 2020-06-30 NOTE — Progress Notes (Signed)
[place] PHYSICAL MEDICINE & REHABILITATION PROGRESS NOTE   Subjective/Complaints:   Pt reports got his sip and puff to call nursing.  No trach suctioning since Sunday- will try and remove trach Monday/Tuesday.   A little dizziness right now- not too bad- sitting up at 90 degrees.   Wearing abd binder/TEDs-   ROS:    Pt denies SOB, abd pain, CP, N/V/C/D, and vision changes    Objective:   DG Abd Portable 1V  Result Date: 06/28/2020 CLINICAL DATA:  Follow-up examination EXAM: PORTABLE ABDOMEN - 1 VIEW COMPARISON:  None. FINDINGS: Hyperdense material is seen within the distal colon and rectal vault, likely representing material administered during modified barium swallow examination performed 06/27/2020. Normal abdominal gas pattern. No gross free intraperitoneal gas. Gastrostomy catheter overlies the left upper quadrant of the abdomen. IMPRESSION: Normal abdominal gas pattern. Previously administered oral contrast is now seen within the distal colon and rectum. Electronically Signed   By: Fidela Salisbury MD   On: 06/28/2020 19:27   No results for input(s): WBC, HGB, HCT, PLT in the last 72 hours. Recent Labs    06/28/20 0752 06/30/20 0536  NA 151* 151*  K 3.7 3.6  CL 112* 112*  CO2 28 29  GLUCOSE 140* 97  BUN 37* 46*  CREATININE 0.87 0.87  CALCIUM 9.7 9.9    Intake/Output Summary (Last 24 hours) at 06/30/2020 1532 Last data filed at 06/30/2020 0505 Gross per 24 hour  Intake 240 ml  Output 1825 ml  Net -1585 ml     Pressure Injury 06/23/20 Coccyx Medial;Left Unstageable - Full thickness tissue loss in which the base of the injury is covered by slough (yellow, tan, gray, green or brown) and/or eschar (tan, brown or black) in the wound bed. upper left sacrum is [name] (Active)  06/23/20 1710  Location: Coccyx  Location Orientation: Medial;Left  Staging: Unstageable - Full thickness tissue loss in which the base of the injury is covered by slough (yellow, tan, gray,  green or brown) and/or eschar (tan, brown or black) in the wound bed.  Wound Description (Comments): upper left sacrum is unstageable and has a leathery covering over the wound bed  Present on Admission: Yes     Pressure Injury 06/23/20 Buttocks Right Stage 3 -  Full thickness tissue loss. Subcutaneous fat may be visible but bone, tendon or muscle are NOT exposed. healing stage 3 (Active)  06/23/20 1838  Location: Buttocks  Location Orientation: Right  Staging: Stage 3 -  Full thickness tissue loss. Subcutaneous fat may be visible but bone, tendon or muscle are NOT exposed.  Wound Description (Comments): healing stage 3  Present on Admission: Yes     Pressure Injury 06/23/20 Left Stage 3 -  Full thickness tissue loss. Subcutaneous fat may be visible but bone, tendon or muscle are NOT exposed. healing stage 3 (Active)  06/23/20 1839  Location:   Location Orientation: Left  Staging: Stage 3 -  Full thickness tissue loss. Subcutaneous fat may be visible but bone, tendon or muscle are NOT exposed.  Wound Description (Comments): healing stage 3  Present on Admission: Yes    Physical Exam: Vital Signs Blood pressure 115/75, pulse 71, temperature 97.7 F (36.5 C), resp. rate 18, height 6\' 5"  (1.956 m), weight 105.2 kg, SpO2 100 %. General:  Pt sitting up at 90 degrees; NAD HEENT: trach capped- sats 97% this AM Heart: RRR Chest: a little coarse, but overall better CTA B/L Abdomen: Soft, NT, ND, (+)BS  (+)  PEG in place Extremities: No clubbing, cyanosis, or edema. Pulses are 2+ Skin: stage 2 pressure injuries on buttock and coccyx Neuro: Ox3- quiet  Motor: B/l UE shoulder abduction 1+/5, distally 0/5 with ?trace flicker left wrist flexor B/l LE: 0/5 proximal to distal Sensation diminished to light touch in all extremities Psych: quiet, flat affect     Assessment/Plan: 1. 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  Physiatrist is providing close team supervision and 24 hour management of active medical problems listed below.  Physiatrist and rehab team continue to assess barriers to discharge/monitor patient progress toward functional and medical goals  Care Tool:  Bathing              Bathing assist Assist Level: 2 Helpers     Upper Body Dressing/Undressing Upper body dressing   What is the patient wearing?: Pull over shirt    Upper body assist Assist Level: 2 Helpers    Lower Body Dressing/Undressing Lower body dressing      What is the patient wearing?: Incontinence brief     Lower body assist Assist for lower body dressing: 2 Helpers     Toileting Toileting    Toileting assist Assist for toileting: Dependent - Patient 0%     Transfers Chair/bed transfer  Transfers assist  Chair/bed transfer activity did not occur: Safety/medical concerns  Chair/bed transfer assist level: Dependent - mechanical lift     Locomotion Ambulation   Ambulation assist   Ambulation activity did not occur: Safety/medical concerns          Walk 10 feet activity   Assist           Walk 50 feet activity   Assist           Walk 150 feet activity   Assist           Walk 10 feet on uneven surface  activity   Assist           Wheelchair     Assist Will patient use wheelchair at discharge?: Yes Type of Wheelchair: Power Wheelchair activity did not occur: Safety/medical concerns         Wheelchair 50 feet with 2 turns activity    Assist    Wheelchair 50 feet with 2 turns activity did not occur: Safety/medical concerns       Wheelchair 150 feet activity     Assist  Wheelchair 150 feet activity did not occur: Safety/medical concerns       Blood pressure 115/75, pulse 71, temperature 97.7 F (36.5 C), resp. rate 18, height 6\' 5"  (1.956 m), weight 105.2 kg, SpO2 100 %.    Medical Problem  List and Plan: 1.  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  Antithrombotics: -DVT/anticoagulation:  Pharmaceutical: Lovenox--check dopplers in am.  10/5- Dopplers (-)             -antiplatelet therapy: N/A 3. Pain Management: N/A 4. Mood: LCSW to follow for evaluation and support.              -antipsychotic agents: N/A 5. Neuropsych: This patient is capable of making decisions on  his own behalf. 6. Sacral decub/Skin/Wound Care: Pictures show wound in different stages of healing. Ordered Air mattress overlay. Continue vitamin and prostat to promote healing. Will add Juven also for collagen. Will increase tube feed to 50 cc/hr as likely has higher nutritional need. Will consult dietician to help address nutritional needs.   10/4- will consult dietician- BUN up to 54 from 27 and Cr 1.19 from <1- asked to evaluate dryness  10/5- haven't seen a dietician note- will check on this.  7. Fluids/Electrolytes/Nutrition: Monitor I/Os.CMP ordered. Transition to bolus tube feeds next week if stable.   10/5- very dry- have placed Dietician order- need to help pt with BUN up to 54  10/7- Na 151- will ask Nutrition to see today and reassess 10/8- changed to increase water flushes 250cc/q4 hours- Labs Sunday 8. Orthostatic Hypotension: Secondary to autonomic dysfunction has resolved.              Monitor with increased activity, particularly for orthostasis.   10/3 well controlled   10/4- will get abd binder- cut a hole for PEG  10/5- will add Midodrine 5 mg TID with meals- due to BP 43E/ systolic  76/1- will add Florinef 0.1 mg daily- and titrate up as required  10/8- BP better today- less dizzy 9. Acute on chronic anemia: Improving--continue to monitor. Added iron supplement.              CBC stable 10/2 10. Hypernatremia/Hyperglycemia: Due to intermittent IV dextrose. Added water  flushes. May need low sodium formula.   10/4- Na 144- doing OK 11. Neurogenic bladder: Maintain foley due to sacral wounds.. 12. Neurogenic bowel:Continue senna in am with suppository in evenings for bowel program. Moving bowels regularly   10/3: Patient states he is moving his bowels regularly.  10/5- LBM last night with bowel program   10/7- going a lot- have asked nutrition to help bulk up stools 13. Secretions/Trach/capped: continue scopolamine patch  10/5- will teach staff about quad coughing-  14. Azotemia/NPO- getting TFs  10/4- consulted dietician  10/5- don't see note from nutrition- will check on this.   10/7- BUN down to 37 from 54- and Cr <1 now- doing better  10/8- Bun up to 46- called nutrition- will con't Nepro but try to recheck Labs Sunday      LOS: 7 days A FACE TO FACE EVALUATION WAS PERFORMED  Nao Linz 06/30/2020, 3:32 PM

## 2020-06-30 NOTE — Progress Notes (Signed)
Physical Therapy Session Note  Patient Details  Name: Ryan Day MRN: [id number] Date of Birth: 03-18-1951  Today's Date: 06/30/2020 PT Individual Time: 1001-1059 PT Individual Time Calculation (min): 58 min   Short Term Goals: Week 1:  PT Short Term Goal 1 (Week 1): Pt will begin to instruct caregiver/staff in bed mobility and transfers. PT Short Term Goal 2 (Week 1): Pt will increase sitting tolerance on edge of bed to 30 minutes assist with ADL/mobility PT Short Term Goal 3 (Week 1): Pt will increase sitting balance on edge of bed to mod to max A to assist with ADLs/mobility PT Short Term Goal 4 (Week 1): Pt will propel power w/c on level surfaces with S and verbal cues.  Skilled Therapeutic Interventions/Progress Updates:    pt received in bed and agreeable to therapy, abdominal binder, TED hose, and ace wrapping in place. Pt denied pain at start and end of session. Pt directed in tilt table function with KREG bed use, setup up with 3 wrappings for support at upper trunk, hips, and above knees for safety. Bps taken in supine:110/63, at 30 degrees 86/55, post one min at this angle 91/56, 2 mins at this angle 98/63, at 40 degrees 97/60 and at 45 degrees 85/52, one min at this angle 71/52, pt returned to supine. Pt denied all symptoms during this despite BP decreasing. HR and O2 remained WNL throughout. Pt then directed in rolling in bed dependent x2 R and L x1 for lift pad placement. Maxi sky used to transfer pt into [facility], 2 helpers for safety. Once in [facility], pt required dependent level assist x2 for repositioning and WC mobility. Pt also requires use of sip and puff call light which required extra time for setup of call light sip and puff and suction placement within pt's ability to effectively used. Once pt able to consistently use both, PT left in [facility] these needs in place with good use. Nursing aware pt in chair. Door left open.   Therapy Documentation Precautions:  Precautions Precautions:  Cervical Precaution Booklet Issued: No Precaution Comments: C collar on when egde of bed, upright or OOB Required Braces or Orthoses: Cervical Brace Cervical Brace: Hard collar, Other (comment) (C collar when edge of bed, upright or OOB) Restrictions Weight Bearing Restrictions: No Vital Signs: Therapy Vitals Pulse Rate: 72 Resp: 18 Patient Position (if appropriate): Sitting Oxygen Therapy SpO2: 97 % O2 Device: Tracheostomy Collar    Therapy/Group: Individual Therapy  Junie Panning 06/30/2020, 11:25 AM

## 2020-06-30 NOTE — Progress Notes (Signed)
Occupational Therapy Session Note  Patient Details  Name: Ryan Day MRN: [id number] Date of Birth: 04-21-1951  Today's Date: 06/30/2020 OT Individual Time: 1105-1130 OT Individual Time Calculation (min): 25 min    Short Term Goals: Week 1:  OT Short Term Goal 1 (Week 1): Pt will maintain static sitting balnace wiht MAX A of 1 caregiver for 3 min at EOB/EOM OT Short Term Goal 1 - Progress (Week 1): Progressing toward goal OT Short Term Goal 2 (Week 1): Pt will direct functional transfers with max VC OT Short Term Goal 2 - Progress (Week 1): Progressing toward goal OT Short Term Goal 3 (Week 1): Pt will roll B in bed with MOD +2 A OT Short Term Goal 3 - Progress (Week 1): Progressing toward goal  Skilled Therapeutic Interventions/Progress Updates:    1:1. Pt received in TIS agreeable to [employer] discussion and edu re [place] phone remote. OT sets up puck to TV and demo use of phone to control tv d/t pt tretraplegia impacting ability to use remote. Pt educated on use of either voice or mouth stick to control phone and will trial at later date since cell phone is not present. OT calls wife and reviews AT discussion and wife to bring phone tomorrow for pt to trial. Exited session with pt seated in TIS, exit alarm on and call light in reach  Therapy Documentation Precautions:  Precautions Precautions: Cervical Precaution Booklet Issued: No Precaution Comments: C collar on when egde of bed, upright or OOB Required Braces or Orthoses: Cervical Brace Cervical Brace: Hard collar, Other (comment) (C collar when edge of bed, upright or OOB) Restrictions Weight Bearing Restrictions: No General:   Vital Signs: Therapy Vitals Pulse Rate: 72 Resp: 18 Patient Position (if appropriate): Sitting Oxygen Therapy SpO2: 97 % O2 Device: Tracheostomy Collar Pain:   ADL: ADL Eating: NPO Grooming: Dependent Upper Body Bathing: Dependent Lower Body Bathing: Dependent Upper Body  Dressing: Dependent Where Assessed-Upper Body Dressing: Bed level Lower Body Dressing: Dependent Where Assessed-Lower Body Dressing: Bed level Toileting: Dependent Where Assessed-Toileting: Bed level Vision   Perception    Praxis   Exercises:   Other Treatments:     Therapy/Group: Individual Therapy  Tonny Branch 06/30/2020, 11:37 AM

## 2020-06-30 NOTE — Progress Notes (Signed)
Speech Language Pathology Weekly Progress and Session Note  Patient Details  Name: Ryan Day MRN: [id number] Date of Birth: 04-May-1951  Beginning of progress report period: June 23, 2020 End of progress report period: June 30, 2020  Today's Date: 06/30/2020 SLP Individual Time:  -     Short Term Goals: Week 1: SLP Short Term Goal 1 (Week 1): Pt will consume trials of thin liquids (ice chips) with vital signs remaining WFL prior to repeat instrumental assessment. SLP Short Term Goal 2 (Week 1): Pt will participate in instrumental swallow assessment. SLP Short Term Goal 2 - Progress (Week 1): Met SLP Short Term Goal 3 (Week 1): Pt will perform pharyngeal strength and RMT exercises to improve vocal intensity and swallow function. SLP Short Term Goal 3 - Progress (Week 1): Met SLP Short Term Goal 4 (Week 1): Pt will increase vocal intensity at the phrase level to 70% intelligibility with min A verbal cues to clarify communication. SLP Short Term Goal 4 - Progress (Week 1): Met    New Short Term Goals: Week 2: SLP Short Term Goal 1 (Week 2): Patient will perform pharyngeal swallow exercises with minA. SLP Short Term Goal 2 (Week 2): Patient will perform EMST 20-25 reps at 7-8 resistance. SLP Short Term Goal 3 (Week 2): Patient will maintain adequate vocal intensity at 10-12 word phrase/sentence level. SLP Short Term Goal 4 (Week 2): Patient will manage pharyngeal residuals during ice chip trials with throat clear, hard swallow to clear as evidenced by vocal quality returning to baseline.  Weekly Progress Updates:  Patient met all 4 STG's focused on voice and swallow function. Recent MBS revealed silent aspiration and penetration of nectar and thin liquids after the swallow secondary to pyriform residuals moving into laryngeal vestibule and eventually aspirating. He remains NPO except for water protocol and ice chip trials with SLP. Patient is able to achieve increased vocal intensity and  speech intelligibility but with moderate effort. He continues to benefit from skilled SLP intervention to maximize speech/voice and swallow function prior to discharge.   Intensity: Minumum of 1-2 x/day, 30 to 90 minutes Frequency: 3 to 5 out of 7 days Duration/Length of Stay: 4 weeks Treatment/Interventions: English as a second language teacher;Dysphagia/aspiration precaution training;Therapeutic Exercise;Patient/family education   General   pleasant, alert Pain  No/denies pain  Therapy/Group: Individual Therapy  Sonia Baller, MA, CCC-SLP Speech Therapy

## 2020-06-30 NOTE — Progress Notes (Signed)
Pt had medium brown stool after bowel protocol provided on previous shift.

## 2020-06-30 NOTE — Progress Notes (Signed)
Speech Language Pathology Daily Session Note  Patient Details  Name: Borden Thune MRN: [id number] Date of Birth: 13-Mar-1951  Today's Date: 07/02/2020 SLP Individual Time: 0800-0826 SLP Individual Time Calculation (min): 26 min  Short Term Goals: Week 2: SLP Short Term Goal 1 (Week 2): Patient will perform pharyngeal swallow exercises with minA. SLP Short Term Goal 2 (Week 2): Patient will perform EMST 20-25 reps at 7-8 resistance. SLP Short Term Goal 3 (Week 2): Patient will maintain adequate vocal intensity at 10-12 word phrase/sentence level. SLP Short Term Goal 4 (Week 2): Patient will manage pharyngeal residuals during ice chip trials with throat clear, hard swallow to clear as evidenced by vocal quality returning to baseline.  Skilled Therapeutic Interventions: Pt was seen for skilled ST targeting speech and pharyngeal strengthening goals. Pt performed 2 sets of 15 (30 total) of EMST exercises with device set to 7 cm H2O resistance with a self perceived difficulty rating level of 7/10. Pt able to expectorate small amount of thin secretions via suction after performing EMST exercises. [name] entered room during session and had questions regarding EMST device - skilled educational regarding benefits for breath support, voice, pharyngeal strengthening, and secretion mobilization provided. With Moderate verbal cues, pt able to increase his vocal intensity to clarify phrase level messages to his wife during session. Pt left laying in bed with alarm set and needs within reach, RT and wife present. Continue per current plan of care.          Pain Pain Assessment Pain Scale: 0-10 Pain Score: 0-No pain  Therapy/Group: Individual Therapy  Arbutus Leas 07/02/2020, 12:11 PM

## 2020-06-30 NOTE — Progress Notes (Addendum)
Physical Therapy Session Note  Patient Details  Name: Ryan Day MRN: [id number] Date of Birth: 12-Aug-1951  Today's Date: 06/30/2020 PT Individual Time:1305-1400 PT Individual Time Calculation (min): 55 min   Short Term Goals: Week 1:  PT Short Term Goal 1 (Week 1): Pt will begin to instruct caregiver/staff in bed mobility and transfers. PT Short Term Goal 2 (Week 1): Pt will increase sitting tolerance on edge of bed to 30 minutes assist with ADL/mobility PT Short Term Goal 3 (Week 1): Pt will increase sitting balance on edge of bed to mod to max A to assist with ADLs/mobility PT Short Term Goal 4 (Week 1): Pt will propel power w/c on level surfaces with S and verbal cues.  Skilled Therapeutic Interventions/Progress Updates:   Pt received sitting in TIS WC and agreeable to PT. PT performed PROM with prolonged stretch at end range to improve ROM for BUE BLE. Knee extension, ankle DF, elbow extension shoulder extension, wrist extension. Each completed 4 x 1 min, no pain reported by pt.   Sitting balance in neutral position 3 x 1 minute with max assist. Pt able to initiate forward lean and posterior lean on 1st 2 bouts from neutral position. Pt noted to utilize deltoid initially to stabilize trunk, until fatigue noted.   Shoulder AAROM shrug and abduction. Noted  trace adduction on the LUE. complted 2 x 5 BUE with proximal pressure to reduce sublux in BUE.   Orthostatic BP check from reclined position 113/71, upright sitting 108/66. No s/s of orthostasis.   PT educated pt and family member on use of TIS for pressure relief to perform posterior tillt 2 min every 30 min to prevent skin breakdown.   Pt left sitting WC with family member present and all need met.       Therapy Documentation Precautions:  Precautions Precautions: Cervical Precaution Booklet Issued: No Precaution Comments: C collar on when egde of bed, upright or OOB Required Braces or Orthoses: Cervical Brace Cervical  Brace: Hard collar, Other (comment) (C collar when edge of bed, upright or OOB) Restrictions Weight Bearing Restrictions: No Vital Signs: Therapy Vitals Pulse Rate: 72 Resp: 18 Patient Position (if appropriate): Sitting Oxygen Therapy SpO2: 97 % O2 Device: Tracheostomy Collar Pain: Pain Assessment Pain Scale: 0-10 Pain Score: 0-No pain    Therapy/Group: Individual Therapy  Lorie Phenix 06/30/2020, 2:03 PM

## 2020-06-30 NOTE — Progress Notes (Signed)
Occupational Therapy Session Note  Patient Details  Name: Ryan Day MRN: [id number] Date of Birth: 05-Aug-1951  Today's Date: 06/30/2020 OT Individual Time: 0700-0810 OT Individual Time Calculation (min): 70 min    Short Term Goals: Week 1:  OT Short Term Goal 1 (Week 1): Pt will maintain static sitting balnace wiht MAX A of 1 caregiver for 3 min at EOB/EOM OT Short Term Goal 2 (Week 1): Pt will direct functional transfers with max VC OT Short Term Goal 3 (Week 1): Pt will roll B in bed with MOD +2 A  Skilled Therapeutic Interventions/Progress Updates:    PT resting in bed upon arrival.  B hand splints and B PRAFO in place. OT intervention with focus on pt directing care and sitting tolerance in chair position in [place] bed. BUE and BLE PROM/stretching performed. BP supine with Ted hose, Ace wraps, and abdominal binder in place-114/71. BP in chair position (70*)-111/74. Suction and puff call bell in place and pt able to access and activate, Pt tolerated PROM and repositioning. Pt remained in chair position with hip/waist strap secured. BUE supported on pillows. Bed rails up. RN notified of pt status.   Therapy Documentation Precautions:  Precautions Precautions: Cervical Precaution Booklet Issued: No Precaution Comments: C collar on when egde of bed, upright or OOB Required Braces or Orthoses: Cervical Brace Cervical Brace: Hard collar, Other (comment) (C collar when edge of bed, upright or OOB) Restrictions Weight Bearing Restrictions: No Pain:  Pt denies pain this morning   Therapy/Group: Individual Therapy  [name] 06/30/2020, 8:12 AM

## 2020-07-01 DIAGNOSIS — S14109S Unspecified injury at unspecified level of cervical spinal cord, sequela: Secondary | ICD-10-CM | POA: Diagnosis not present

## 2020-07-01 LAB — GLUCOSE, CAPILLARY
Glucose-Capillary: 105 mg/dL — ABNORMAL HIGH (ref 70–99)
Glucose-Capillary: 138 mg/dL — ABNORMAL HIGH (ref 70–99)
Glucose-Capillary: 144 mg/dL — ABNORMAL HIGH (ref 70–99)
Glucose-Capillary: 173 mg/dL — ABNORMAL HIGH (ref 70–99)
Glucose-Capillary: 77 mg/dL (ref 70–99)
Glucose-Capillary: 86 mg/dL (ref 70–99)
Glucose-Capillary: 93 mg/dL (ref 70–99)

## 2020-07-01 MED ORDER — BACITRACIN-NEOMYCIN-POLYMYXIN OINTMENT TUBE
TOPICAL_OINTMENT | Freq: Two times a day (BID) | CUTANEOUS | Status: DC
  Administered 2020-07-05 – 2020-07-11 (×3): 1 via TOPICAL
  Filled 2020-07-01: qty 14

## 2020-07-01 NOTE — Plan of Care (Signed)
  Problem: Consults Goal: RH SPINAL CORD INJURY PATIENT EDUCATION Description: Patient and family will gain knowledge of disease management, pain management, bowel and bladder management, and skin and wound care during this admission. Outcome: Progressing Goal: Skin Care Protocol Initiated - if Braden Score 18 or less Description: If consults are not indicated, leave blank or document N/A Outcome: Progressing Goal: Nutrition Consult-if indicated Outcome: Progressing Goal: Diabetes Guidelines if Diabetic/Glucose > 140 Description: If diabetic or lab glucose is > 140 mg/dl - Initiate Diabetes/Hyperglycemia Guidelines & Document Interventions  Outcome: Progressing   Problem: SCI BOWEL ELIMINATION Goal: RH STG MANAGE BOWEL WITH ASSISTANCE Description: STG Manage Bowel with mod to max Assistance. Outcome: Progressing Goal: RH STG SCI MANAGE BOWEL WITH MEDICATION WITH ASSISTANCE Description: STG SCI Manage bowel with medication with mod to max assistance. Outcome: Progressing Goal: RH STG SCI MANAGE BOWEL PROGRAM W/ASSIST OR AS APPROPRIATE Description: STG SCI Manage bowel program mod to max /assist or as appropriate. Outcome: Progressing   Problem: SCI BLADDER ELIMINATION Goal: RH STG MANAGE BLADDER WITH EQUIPMENT WITH ASSISTANCE Description: STG Manage Bladder With Equipment With mod to max Assistance Outcome: Progressing Goal: RH STG SCI MANAGE BLADDER PROGRAM W/ASSISTANCE Description: mod to max assist Outcome: Progressing   Problem: RH SKIN INTEGRITY Goal: RH STG SKIN FREE OF INFECTION/BREAKDOWN Description: mod to max assist Outcome: Progressing Goal: RH STG ABLE TO PERFORM INCISION/WOUND CARE W/ASSISTANCE Description: STG Able To Perform Incision/Wound Care With mod to max  Assistance. Outcome: Progressing   Problem: RH SAFETY Goal: RH STG ADHERE TO SAFETY PRECAUTIONS W/ASSISTANCE/DEVICE Description: STG Adhere to Safety Precautions With mod to max  Assistance/Device. Outcome: Progressing Goal: RH STG DECREASED RISK OF FALL WITH ASSISTANCE Description: STG Decreased Risk of Fall With mod to max Assistance. Outcome: Progressing   Problem: RH PAIN MANAGEMENT Goal: RH STG PAIN MANAGED AT OR BELOW PT'S PAIN GOAL Description: <4 on a 0-10 pain scale Outcome: Progressing   Problem: RH KNOWLEDGE DEFICIT SCI Goal: RH STG INCREASE KNOWLEDGE OF SELF CARE AFTER SCI Description: mod to max assist Outcome: Progressing   

## 2020-07-01 NOTE — Progress Notes (Signed)
PHYSICAL MEDICINE & REHABILITATION PROGRESS NOTE   Subjective/Complaints:   Hasn't required trach suctioning since Sunday.   Looked at buttocks wounds- and also has a new meatus wound- around foley site.    ROS:   Pt denies SOB, abd pain, CP, N/V/C/D, and vision changes    Objective:   No results found. No results for input(s): WBC, HGB, HCT, PLT in the last 72 hours. Recent Labs    06/30/20 0536  NA 151*  K 3.6  CL 112*  CO2 29  GLUCOSE 97  BUN 46*  CREATININE 0.87  CALCIUM 9.9    Intake/Output Summary (Last 24 hours) at 07/01/2020 1410 Last data filed at 07/01/2020 0439 Gross per 24 hour  Intake 815 ml  Output 2525 ml  Net -1710 ml     Pressure Injury 06/23/20 Coccyx Medial;Left Unstageable - Full thickness tissue loss in which the base of the injury is covered by slough (yellow, tan, gray, green or brown) and/or eschar (tan, brown or black) in the wound bed. upper left sacrum is Ryan Day (Active)  06/23/20 1710  Location: Coccyx  Location Orientation: Medial;Left  Staging: Unstageable - Full thickness tissue loss in which the base of the injury is covered by slough (yellow, tan, gray, green or brown) and/or eschar (tan, brown or black) in the wound bed.  Wound Description (Comments): upper left sacrum is unstageable and has a leathery covering over the wound bed  Present on Admission: Yes     Pressure Injury 06/23/20 Buttocks Right Stage 3 -  Full thickness tissue loss. Subcutaneous fat may be visible but bone, tendon or muscle are NOT exposed. healing stage 3 (Active)  06/23/20 1838  Location: Buttocks  Location Orientation: Right  Staging: Stage 3 -  Full thickness tissue loss. Subcutaneous fat may be visible but bone, tendon or muscle are NOT exposed.  Wound Description (Comments): healing stage 3  Present on Admission: Yes     Pressure Injury 06/23/20 Left Stage 3 -  Full thickness tissue loss. Subcutaneous fat may be visible but bone, tendon or  muscle are NOT exposed. healing stage 3 (Active)  06/23/20 1839  Location:   Location Orientation: Left  Staging: Stage 3 -  Full thickness tissue loss. Subcutaneous fat may be visible but bone, tendon or muscle are NOT exposed.  Wound Description (Comments): healing stage 3  Present on Admission: Yes     Pressure Injury 06/30/20 Penis Medial;Posterior Stage 2 -  Partial thickness loss of dermis presenting as a shallow open injury with a red, pink wound bed without slough. catheter sized indention and opening of skin  of urethral meatus on posterior side  (Active)  06/30/20 1845  Location: Penis  Location Orientation: Medial;Posterior  Staging: Stage 2 -  Partial thickness loss of dermis presenting as a shallow open injury with a red, pink wound bed without slough.  Wound Description (Comments): catheter sized indention and opening of skin  of urethral meatus on posterior side of the urethral meatus  Present on Admission:     Physical Exam: Vital Signs Blood pressure 127/69, pulse 67, temperature 97.8 F (36.6 C), temperature source Oral, resp. rate 18, height 6\' 5"  (1.956 m), weight 105.2 kg, SpO2 99 %. General:  Pt awake, but sleepy, laying in bed- using sip and puff, NAD HEENT: trach capped- still sats 98% Heart: RRR Chest: a little coarse- but no W/R/R- adequate air movement Abdomen: Soft, NT, ND, (+)BS   (+) PEG in place Extremities: No  clubbing, cyanosis, or edema. Pulses are 2+ Skin: Stage II/deep or superficial Stage III- more slough is resolved (not all of it)- bright pink/granulating overall-     Ox3- quiet Motor: B/l UE shoulder abduction 1+/5, distally 0/5 with ?trace flicker left wrist flexor B/l LE: 0/5 proximal to distal Sensation diminished to light touch in all extremities Psych: flat affect     Assessment/Plan: 1. 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  Physiatrist is providing close team supervision and 24 hour management of active medical problems listed below.  Physiatrist and rehab team continue to assess barriers to discharge/monitor patient progress toward functional and medical goals  Care Tool:  Bathing              Bathing assist Assist Level: 2 Helpers     Upper Body Dressing/Undressing Upper body dressing   What is the patient wearing?: Pull over shirt    Upper body assist Assist Level: 2 Helpers    Lower Body Dressing/Undressing Lower body dressing      What is the patient wearing?: Incontinence brief     Lower body assist Assist for lower body dressing: 2 Helpers     Toileting Toileting    Toileting assist Assist for toileting: Dependent - Patient 0%     Transfers Chair/bed transfer  Transfers assist  Chair/bed transfer activity did not occur: Safety/medical concerns  Chair/bed transfer assist level: Dependent - mechanical lift     Locomotion Ambulation   Ambulation assist   Ambulation activity did not occur: Safety/medical concerns          Walk 10 feet activity   Assist           Walk 50 feet activity   Assist           Walk 150 feet activity   Assist           Walk 10 feet on uneven surface  activity   Assist           Wheelchair     Assist Will patient use wheelchair at discharge?: Yes Type of Wheelchair: Power Wheelchair activity did not occur: Safety/medical concerns         Wheelchair 50 feet with 2 turns activity    Assist    Wheelchair 50 feet with 2 turns activity did not occur: Safety/medical concerns       Wheelchair 150 feet activity     Assist  Wheelchair 150 feet activity did not occur: Safety/medical concerns       Blood pressure 127/69, pulse 67, temperature 97.8 F (36.6 C), temperature source Oral, resp. rate 18, height 6\' 5"  (1.956 m), weight 105.2 kg, SpO2 99 %.    Medical Problem List  and Plan: 1.  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  Antithrombotics: -DVT/anticoagulation:  Pharmaceutical: Lovenox--check dopplers in am.  10/5- Dopplers (-)             -antiplatelet therapy: N/A 3. Pain Management: N/A 4. Mood: LCSW to follow for evaluation and support.              -antipsychotic agents: N/A 5. Neuropsych:  This patient is capable of making decisions on his own behalf. 6. Sacral decub/Skin/Wound Care: Pictures show wound in different stages of healing. Ordered Air mattress overlay. Continue vitamin and prostat to promote healing. Will add Juven also for collagen. Will increase tube feed to 50 cc/hr as likely has higher nutritional need. Will consult dietician to help address nutritional needs.   10/4- will consult dietician- BUN up to 54 from 27 and Cr 1.19 from <1- asked to evaluate dryness  10/5- haven't seen a dietician note- will check on this.  10/9- less slough, so will ask WOC to see if can change to silver alginate, or something like that.  Also ordered neosporin for new wound around Urethral meatus 7. Fluids/Electrolytes/Nutrition: Monitor I/Os.CMP ordered. Transition to bolus tube feeds next week if stable.   10/5- very dry- have placed Dietician order- need to help pt with BUN up to 54  10/7- Na 151- will ask Nutrition to see today and reassess 10/8- changed to increase water flushes 250cc/q4 hours- Labs Sunday  10/9- labs in AM 8. Orthostatic Hypotension: Secondary to autonomic dysfunction has resolved.              Monitor with increased activity, particularly for orthostasis.   10/3 well controlled   10/4- will get abd binder- cut a hole for PEG  10/5- will add Midodrine 5 mg TID with meals- due to BP 21J/ systolic  94/1- will add Florinef 0.1 mg daily- and titrate up as required  10/8- BP better today- less dizzy 9. Acute on chronic  anemia: Improving--continue to monitor. Added iron supplement.              CBC stable 10/2 10. Hypernatremia/Hyperglycemia: Due to intermittent IV dextrose. Added water flushes. May need low sodium formula.   10/4- Na 144- doing OK  10/9- Na 151- rechecking in AM 11. Neurogenic bladder: Maintain foley due to sacral wounds.. 12. Neurogenic bowel:Continue senna in am with suppository in evenings for bowel program. Moving bowels regularly   10/9- BMs with bowel program 13. Secretions/Trach/capped: continue scopolamine patch  10/5- will teach staff about quad coughing-  14. Azotemia/NPO- getting TFs  10/4- consulted dietician  10/5- don't see note from nutrition- will check on this.   10/7- BUN down to 37 from 54- and Cr <1 now- doing better  10/8- Bun up to 46- called nutrition- will con't Nepro but try to recheck Labs Sunday  10/9- labs in AM      LOS: 8 days A FACE TO FACE EVALUATION WAS PERFORMED  Ryan Day 07/01/2020, 2:10 PM

## 2020-07-01 NOTE — Progress Notes (Signed)
Patient ID: Ryan Day, male   DOB: [birth year], 69 y.o.   MRN: [id number] Bowel program per order with dig stim and suppository produced no results. No stool noted in rectal vault with dig stim. Patient tolerated procedure well. Noted device pressure area on tip of penis, Area cleansed and securement stat lock removed and reposiition new stat lock securement device so that urethral catheter was not pulled taunt nor pulled on the penis. Margarito Liner

## 2020-07-01 NOTE — Progress Notes (Signed)
Bowel program per MD order with dig stim and suppository, no BM noted in rectal. Patient tolerated procedure well.

## 2020-07-02 ENCOUNTER — Inpatient Hospital Stay (HOSPITAL_COMMUNITY): Payer: BC Managed Care – PPO | Admitting: Speech Pathology

## 2020-07-02 ENCOUNTER — Inpatient Hospital Stay (HOSPITAL_COMMUNITY): Payer: BC Managed Care – PPO | Admitting: Occupational Therapy

## 2020-07-02 DIAGNOSIS — S14109S Unspecified injury at unspecified level of cervical spinal cord, sequela: Secondary | ICD-10-CM | POA: Diagnosis not present

## 2020-07-02 LAB — GLUCOSE, CAPILLARY
Glucose-Capillary: 106 mg/dL — ABNORMAL HIGH (ref 70–99)
Glucose-Capillary: 106 mg/dL — ABNORMAL HIGH (ref 70–99)
Glucose-Capillary: 135 mg/dL — ABNORMAL HIGH (ref 70–99)
Glucose-Capillary: 157 mg/dL — ABNORMAL HIGH (ref 70–99)
Glucose-Capillary: 176 mg/dL — ABNORMAL HIGH (ref 70–99)
Glucose-Capillary: 92 mg/dL (ref 70–99)

## 2020-07-02 LAB — BASIC METABOLIC PANEL
Anion gap: 12 (ref 5–15)
BUN: 58 mg/dL — ABNORMAL HIGH (ref 8–23)
CO2: 25 mmol/L (ref 22–32)
Calcium: 10.1 mg/dL (ref 8.9–10.3)
Chloride: 117 mmol/L — ABNORMAL HIGH (ref 98–111)
Creatinine, Ser: 0.94 mg/dL (ref 0.61–1.24)
GFR, Estimated: >60 mL/min (ref >60)
Glucose, Bld: 132 mg/dL — ABNORMAL HIGH (ref 70–99)
Potassium: 4.9 mmol/L (ref 3.5–5.1)
Sodium: 154 mmol/L — ABNORMAL HIGH (ref 135–145)

## 2020-07-02 MED ORDER — PROSOURCE TF PO LIQD
45.0000 mL | Freq: Two times a day (BID) | ORAL | Status: DC
  Administered 2020-07-03 – 2020-07-20 (×35): 45 mL
  Filled 2020-07-02 (×35): qty 45

## 2020-07-02 MED ORDER — OSMOLITE 1.5 CAL PO LIQD
474.0000 mL | Freq: Four times a day (QID) | ORAL | Status: DC
  Administered 2020-07-02 – 2020-07-20 (×71): 474 mL
  Filled 2020-07-02 (×22): qty 474

## 2020-07-02 MED ORDER — FREE WATER
250.0000 mL | Status: DC
  Administered 2020-07-02 – 2020-07-03 (×8): 250 mL

## 2020-07-02 NOTE — Consult Note (Signed)
WOC Nurse Consult Note: Consult received to reassess wound. Also see for new pressure injury (MDRPI) related to urinary catheter. Will see on Monday, 07/03/20.  [place] nursing team will follow, and will remain available to this patient, the nursing and medical teams.   Thanks, Maudie Flakes, MSN, RN, [place], Arther Abbott  Pager# [phone]

## 2020-07-02 NOTE — Progress Notes (Signed)
Occupational Therapy Session Note  Patient Details  Name: Ryan Day MRN: [id number] Date of Birth: 1950/10/25  Today's Date: 07/02/2020 OT Individual Time: 4585-9292 OT Individual Time Calculation (min): 70 min    Short Term Goals: Week 2:  OT Short Term Goal 1 (Week 2): Pt will maintain static sitting balnace wiht MAX A of 1 caregiver for 3 min at EOB/EOM OT Short Term Goal 2 (Week 2): Pt will direct functional transfers with max VC OT Short Term Goal 3 (Week 2): Pt will roll B in bed with MOD +2 A  Skilled Therapeutic Interventions/Progress Updates:    Treatment session with focus on directing care, bed mobility, and increased upright tolerance.  Pt received in sidelying in bed agreeable to therapy session.  Therapist provided choices for session with pt choosing to engage in standing function in bed.  Engaged in Arvin and Lt to apply abdominal binder with +2 to roll.  Therapist applied thigh high TEDS and ace wraps prior to increased upright positioned.  Therapist applied c-collar total assist. Pt able to recall some aspects of setup with bed prior to utilizing standing function and able to direct therapist.  Engaged in upright to 30* x2 and 35* x1.  BP in supine 107/73, at 30* 70/48, back to supine 110/70, at 30* 75/54, after 1 min still at 30* 72/56, back to supine 115/78, at 35* 84/57, after 1 min still at 35* 76/52.  Returned to supine and engaged in rolling to remove abdominal binder +2.  Pt reports he may have had a BM, notified nursing staff due to time constraints.  Pt remained supine in bed to allow nursing staff to roll for hygiene then reposition for pressure relief.  Sip and puff call bell returned to position to allow pt to utilize and suction positioned as well.  Therapy Documentation Precautions:  Precautions Precautions: Cervical Precaution Booklet Issued: No Precaution Comments: C collar on when egde of bed, upright or OOB Required Braces or Orthoses: Cervical  Brace Cervical Brace: Hard collar, Other (comment) (C collar when edge of bed, upright or OOB) Restrictions Weight Bearing Restrictions: No General:   Vital Signs: Therapy Vitals Temp: (!) 97.5 F (36.4 C) Pulse Rate: 82 Resp: (!) 22 BP: 103/63 Oxygen Therapy SpO2: 98 % O2 Device: Room Air Pain: Pain Assessment Pain Scale: 0-10 Pain Score: 0-No pain   Therapy/Group: Individual Therapy  Simonne Come 07/02/2020, 3:20 PM

## 2020-07-02 NOTE — Progress Notes (Signed)
Patient had small soft incontinent bowel movement about 2100. Bathing completed/pt repositioned.

## 2020-07-02 NOTE — Progress Notes (Signed)
Bowel program per MD order with dig stim and suppository, no BM noted in rectal. Patient tolerated procedure well.

## 2020-07-02 NOTE — Progress Notes (Signed)
At 2050 smear noted in brief. Dig stim performed with results of small soft bowel movement.

## 2020-07-02 NOTE — Progress Notes (Signed)
[place] PHYSICAL MEDICINE & REHABILITATION PROGRESS NOTE   Subjective/Complaints:   Wife at bedside- concerned because pt received some ice chips- it says on wall allowed ice chips/water with water protocol/teeth brushed, etc- will check with [facility] when allowed.    Also concerned, appropriately, about meatus wound and pressure ulcer- explained we consulted [place] team about these issues.   When explained I am happy to get pt what he needs, wife wants Neuropsychology for herself/husband- she's very stressed and tearful at times.   [name] also mentioned getting ramp measurements tomorrow and going to social services Tuesday.   Pt's Na is up to 154- will call [place] [facility] to help- since I'm concerned his Na is up so high- since NPO- all from TFs/free water.  Spoke with Dietician- will give him strict I/o's since not being documented- also will increase free water, since BUN up to 58 from 46 but Cr 0.94- so really dry- Nepro not working- will increase free water- IF, doesn't see improvement in Na- will call nephrology in AM   No suctioning since last Sunday- only PO suctioning, not trach suctioning per pt. Of note, K+ 4.9- but hemolyzed.    ROS:   Pt denies SOB, abd pain, CP, N/V/C/D, and vision changes   Objective:   No results found. No results for input(s): WBC, HGB, HCT, PLT in the last 72 hours. Recent Labs    06/30/20 0536 07/02/20 0032  NA 151* 154*  K 3.6 4.9  CL 112* 117*  CO2 29 25  GLUCOSE 97 132*  BUN 46* 58*  CREATININE 0.87 0.94  CALCIUM 9.9 10.1    Intake/Output Summary (Last 24 hours) at 07/02/2020 1414 Last data filed at 07/02/2020 0437 Gross per 24 hour  Intake --  Output 2525 ml  Net -2525 ml     Pressure Injury 06/23/20 Coccyx Medial;Left Unstageable - Full thickness tissue loss in which the base of the injury is covered by slough (yellow, tan, gray, green or brown) and/or eschar (tan, brown or black) in the wound bed. upper left sacrum is [name]  (Active)  06/23/20 1710  Location: Coccyx  Location Orientation: Medial;Left  Staging: Unstageable - Full thickness tissue loss in which the base of the injury is covered by slough (yellow, tan, gray, green or brown) and/or eschar (tan, brown or black) in the wound bed.  Wound Description (Comments): upper left sacrum is unstageable and has a leathery covering over the wound bed  Present on Admission: Yes     Pressure Injury 06/23/20 Buttocks Right Stage 3 -  Full thickness tissue loss. Subcutaneous fat may be visible but bone, tendon or muscle are NOT exposed. healing stage 3 (Active)  06/23/20 1838  Location: Buttocks  Location Orientation: Right  Staging: Stage 3 -  Full thickness tissue loss. Subcutaneous fat may be visible but bone, tendon or muscle are NOT exposed.  Wound Description (Comments): healing stage 3  Present on Admission: Yes     Pressure Injury 06/23/20 Left Stage 3 -  Full thickness tissue loss. Subcutaneous fat may be visible but bone, tendon or muscle are NOT exposed. healing stage 3 (Active)  06/23/20 1839  Location:   Location Orientation: Left  Staging: Stage 3 -  Full thickness tissue loss. Subcutaneous fat may be visible but bone, tendon or muscle are NOT exposed.  Wound Description (Comments): healing stage 3  Present on Admission: Yes     Pressure Injury 06/30/20 Penis Medial;Posterior Stage 2 -  Partial thickness loss  of dermis presenting as a shallow open injury with a red, pink wound bed without slough. catheter sized indention and opening of skin  of urethral meatus on posterior side  (Active)  06/30/20 1845  Location: Penis  Location Orientation: Medial;Posterior  Staging: Stage 2 -  Partial thickness loss of dermis presenting as a shallow open injury with a red, pink wound bed without slough.  Wound Description (Comments): catheter sized indention and opening of skin  of urethral meatus on posterior side of the urethral meatus  Present on Admission:       Physical Exam: Vital Signs Blood pressure 102/67, pulse 78, temperature 98.6 F (37 C), temperature source Oral, resp. rate 16, height 6\' 5"  (1.956 m), weight 105.2 kg, SpO2 97 %. General:  Pt awake, quiet, wife at bedside, NAD HEENT: trach capped-still- sats 99% Heart: RRR Chest: a little coarse- good air movement; no W/R/R Abdomen: Soft, NT, ND, (+)BS    (+) PEG in place Extremities: No clubbing, cyanosis, or edema. Pulses are 2+ Skin: Stage II/deep or superficial Stage III- more slough is resolved (not all of it)- bright pink/granulating overall-     Ox3- quiet Motor: B/l UE shoulder abduction 1+/5, distally 0/5 with ?trace flicker left wrist flexor B/l LE: 0/5 proximal to distal Sensation diminished to light touch in all extremities Psych: flat affect/quiet-      Assessment/Plan: 1. 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  Physiatrist is providing close team supervision and 24 hour management of active medical problems listed below.  Physiatrist and rehab team continue to assess barriers to discharge/monitor patient progress toward functional and medical goals  Care Tool:  Bathing              Bathing assist Assist Level: 2 Helpers     Upper Body Dressing/Undressing Upper body dressing   What is the patient wearing?: Pull over shirt    Upper body assist Assist Level: 2 Helpers    Lower Body Dressing/Undressing Lower body dressing      What is the patient wearing?: Incontinence brief     Lower body assist Assist for lower body dressing: 2 Helpers     Toileting Toileting    Toileting assist Assist for toileting: Dependent - Patient 0%     Transfers Chair/bed transfer  Transfers assist  Chair/bed transfer activity did not occur: Safety/medical concerns  Chair/bed transfer assist level: Dependent - mechanical lift      Locomotion Ambulation   Ambulation assist   Ambulation activity did not occur: Safety/medical concerns          Walk 10 feet activity   Assist           Walk 50 feet activity   Assist           Walk 150 feet activity   Assist           Walk 10 feet on uneven surface  activity   Assist           Wheelchair     Assist Will patient use wheelchair at discharge?: Yes Type of Wheelchair: Power Wheelchair activity did not occur: Safety/medical concerns         Wheelchair 50 feet with 2 turns activity    Assist    Wheelchair 50 feet with 2 turns activity did not occur: Safety/medical concerns       Wheelchair 150 feet activity  Assist  Wheelchair 150 feet activity did not occur: Safety/medical concerns       Blood pressure 102/67, pulse 78, temperature 98.6 F (37 C), temperature source Oral, resp. rate 16, height 6\' 5"  (1.956 m), weight 105.2 kg, SpO2 97 %.    Medical Problem List and Plan: 1.  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  Antithrombotics: -DVT/anticoagulation:  Pharmaceutical: Lovenox--check dopplers in am.  10/5- Dopplers (-)  10/10- needs for 3 months from starting it             -antiplatelet therapy: N/A 3. Pain Management: N/A 4. Mood: LCSW to follow for evaluation and support.              -antipsychotic agents: N/A  10/10- will ask Neuropsych to see pt- when wife there, if possible.  5. Neuropsych: This patient is capable of making decisions on his own behalf. 6. Sacral decub/Skin/Wound Care: Pictures show wound in different stages of healing. Ordered Air mattress overlay. Continue vitamin and prostat to promote healing. Will add Juven also for collagen. Will increase tube feed to 50 cc/hr as likely has higher nutritional need. Will consult dietician to help address  nutritional needs.   10/4- will consult dietician- BUN up to 54 from 27 and Cr 1.19 from <1- asked to evaluate dryness  10/5- haven't seen a dietician note- will check on this.  10/9- less slough, so will ask WOC to see if can change to silver alginate, or something like that.  Also ordered neosporin for new wound around Urethral meatus  10/10- waiting for wound care to see- since weekend.  7. Fluids/Electrolytes/Nutrition: Monitor I/Os.CMP ordered. Transition to bolus tube feeds next week if stable.   10/5- very dry- have placed Dietician order- need to help pt with BUN up to 54  10/7- Na 151- will ask Nutrition to see today and reassess 10/8- changed to increase water flushes 250cc/q4 hours- Labs Sunday  10/9- labs in AM  10/10- Na 154; Cr 0.94 and BUN 58- very dry!- spoke with dietician on call- will increase free water flushes and get off Nepro- not helping.  8. Orthostatic Hypotension: Secondary to autonomic dysfunction has resolved.              Monitor with increased activity, particularly for orthostasis.   10/3 well controlled   10/4- will get abd binder- cut a hole for PEG  10/5- will add Midodrine 5 mg TID with meals- due to BP 26R/ systolic  48/5- will add Florinef 0.1 mg daily- and titrate up as required  10/8- BP better today- less dizzy 9. Acute on chronic anemia: Improving--continue to monitor. Added iron supplement.              CBC stable 10/2 10. Hypernatremia/Hyperglycemia: Due to intermittent IV dextrose. Added water flushes. May need low sodium formula.   10/4- Na 144- doing OK  10/9- Na 151- rechecking in AM 11. Neurogenic bladder: Maintain foley due to sacral wounds.. 12. Neurogenic bowel:Continue senna in am with suppository in evenings for bowel program. Moving bowels regularly   10/1-0 having small BM last night with program 13. Secretions/Trach/capped: continue scopolamine patch  10/5- will teach staff about quad coughing-  38. Azotemia/NPO- getting TFs  10/4-  consulted dietician  10/5- don't see note from nutrition- will check on this.   10/7- BUN down to 37 from 54- and Cr <1 now- doing better  10/8- Bun up to 46- called nutrition- will con't Nepro but try to recheck Labs Sunday  10/9- labs in AM  10/10- as above- dietician increasing free water- IF no improvement, needs Nephrology consult. Also ordered strict I/o's.       LOS: 9 days A FACE TO FACE EVALUATION WAS PERFORMED  Myran Arcia 07/02/2020, 2:14 PM

## 2020-07-02 NOTE — Progress Notes (Addendum)
Brief Nutrition Follow Up  RD contacted via on call pager regarding ongoing hypernatremia.   Patient's tube feeding was changed from nocturnal Osmolite 1.5 to Nepro bolus feedings on 10/8 in attempts to lower sodium. PA concerned that patient would become volume overloaded if free water flushes were increased. Discussed current situation with attending.  Recommend strict I/Os to capture current UOP as BUN continues to trend up and if volume status is an issue. Consider involvement of nephrology. Will increase free water flushes and change tube feeding back to 1.5 cal formula to increase water within formula. If this does not work, may consider addition of D5 to help with hypernatremia.   Na- 144<144<151<151<154 BUN- 27<54<37<46<58  Transition tube feeding:  -Two cartons/ARCs of Osmolite 1.5 four times daily  -45 ml ProSource BID -Free water flushes 250 ml Q3 hours   Provides: 2920 kcals, 142 grams protein, 1448 ml free water (3448 ml with flushes).   Plan to decrease free water as hypernatremia improves.   Mariana Single RD, LDN Clinical Nutrition Pager listed in [place]

## 2020-07-02 NOTE — Plan of Care (Signed)
  Problem: Consults Goal: RH SPINAL CORD INJURY PATIENT EDUCATION Description: Patient and family will gain knowledge of disease management, pain management, bowel and bladder management, and skin and wound care during this admission. Outcome: Progressing Goal: Skin Care Protocol Initiated - if Braden Score 18 or less Description: If consults are not indicated, leave blank or document N/A Outcome: Progressing Goal: Nutrition Consult-if indicated Outcome: Progressing Goal: Diabetes Guidelines if Diabetic/Glucose > 140 Description: If diabetic or lab glucose is > 140 mg/dl - Initiate Diabetes/Hyperglycemia Guidelines & Document Interventions  Outcome: Progressing   Problem: SCI BOWEL ELIMINATION Goal: RH STG MANAGE BOWEL WITH ASSISTANCE Description: STG Manage Bowel with mod to max Assistance. Outcome: Progressing Goal: RH STG SCI MANAGE BOWEL WITH MEDICATION WITH ASSISTANCE Description: STG SCI Manage bowel with medication with mod to max assistance. Outcome: Progressing Goal: RH STG SCI MANAGE BOWEL PROGRAM W/ASSIST OR AS APPROPRIATE Description: STG SCI Manage bowel program mod to max /assist or as appropriate. Outcome: Progressing   Problem: SCI BLADDER ELIMINATION Goal: RH STG MANAGE BLADDER WITH EQUIPMENT WITH ASSISTANCE Description: STG Manage Bladder With Equipment With mod to max Assistance Outcome: Progressing Goal: RH STG SCI MANAGE BLADDER PROGRAM W/ASSISTANCE Description: mod to max assist Outcome: Progressing   Problem: RH SKIN INTEGRITY Goal: RH STG SKIN FREE OF INFECTION/BREAKDOWN Description: mod to max assist Outcome: Progressing Goal: RH STG ABLE TO PERFORM INCISION/WOUND CARE W/ASSISTANCE Description: STG Able To Perform Incision/Wound Care With mod to max  Assistance. Outcome: Progressing   Problem: RH SAFETY Goal: RH STG ADHERE TO SAFETY PRECAUTIONS W/ASSISTANCE/DEVICE Description: STG Adhere to Safety Precautions With mod to max  Assistance/Device. Outcome: Progressing Goal: RH STG DECREASED RISK OF FALL WITH ASSISTANCE Description: STG Decreased Risk of Fall With mod to max Assistance. Outcome: Progressing   Problem: RH PAIN MANAGEMENT Goal: RH STG PAIN MANAGED AT OR BELOW PT'S PAIN GOAL Description: <4 on a 0-10 pain scale Outcome: Progressing   Problem: RH KNOWLEDGE DEFICIT SCI Goal: RH STG INCREASE KNOWLEDGE OF SELF CARE AFTER SCI Description: mod to max assist Outcome: Progressing   

## 2020-07-03 ENCOUNTER — Inpatient Hospital Stay (HOSPITAL_COMMUNITY): Payer: BC Managed Care – PPO | Admitting: *Deleted

## 2020-07-03 ENCOUNTER — Inpatient Hospital Stay (HOSPITAL_COMMUNITY): Payer: BC Managed Care – PPO

## 2020-07-03 DIAGNOSIS — S14109S Unspecified injury at unspecified level of cervical spinal cord, sequela: Secondary | ICD-10-CM | POA: Diagnosis not present

## 2020-07-03 LAB — BASIC METABOLIC PANEL
Anion gap: 13 (ref 5–15)
Anion gap: 11 (ref 5–15)
BUN: 55 mg/dL — ABNORMAL HIGH (ref 8–23)
BUN: 22 mg/dL (ref 8–23)
CO2: 26 mmol/L (ref 22–32)
CO2: 19 mmol/L — ABNORMAL LOW (ref 22–32)
Calcium: 10 mg/dL (ref 8.9–10.3)
Calcium: 9 mg/dL (ref 8.9–10.3)
Chloride: 117 mmol/L — ABNORMAL HIGH (ref 98–111)
Chloride: 114 mmol/L — ABNORMAL HIGH (ref 98–111)
Creatinine, Ser: 1.02 mg/dL (ref 0.61–1.24)
Creatinine, Ser: 0.83 mg/dL (ref 0.61–1.24)
GFR, Estimated: >60 mL/min (ref >60)
GFR, Estimated: >60 mL/min (ref >60)
Glucose, Bld: 111 mg/dL — ABNORMAL HIGH (ref 70–99)
Glucose, Bld: 173 mg/dL — ABNORMAL HIGH (ref 70–99)
Potassium: 3.8 mmol/L (ref 3.5–5.1)
Potassium: 3.9 mmol/L (ref 3.5–5.1)
Sodium: 156 mmol/L — ABNORMAL HIGH (ref 135–145)
Sodium: 144 mmol/L (ref 135–145)

## 2020-07-03 LAB — CBC
HCT: 33.1 % — ABNORMAL LOW (ref 39.0–52.0)
Hemoglobin: 10.2 g/dL — ABNORMAL LOW (ref 13.0–17.0)
MCH: 29.1 pg (ref 26.0–34.0)
MCHC: 30.8 g/dL (ref 30.0–36.0)
MCV: 94.6 fL (ref 80.0–100.0)
Platelets: 314 10*3/uL (ref 150–400)
RBC: 3.5 MIL/uL — ABNORMAL LOW (ref 4.22–5.81)
RDW: 16.2 % — ABNORMAL HIGH (ref 11.5–15.5)
WBC: 10.3 10*3/uL (ref 4.0–10.5)
nRBC: 0.6 % — ABNORMAL HIGH (ref 0.0–0.2)

## 2020-07-03 LAB — GLUCOSE, CAPILLARY
Glucose-Capillary: 101 mg/dL — ABNORMAL HIGH (ref 70–99)
Glucose-Capillary: 113 mg/dL — ABNORMAL HIGH (ref 70–99)
Glucose-Capillary: 162 mg/dL — ABNORMAL HIGH (ref 70–99)
Glucose-Capillary: 185 mg/dL — ABNORMAL HIGH (ref 70–99)
Glucose-Capillary: 229 mg/dL — ABNORMAL HIGH (ref 70–99)
Glucose-Capillary: 242 mg/dL — ABNORMAL HIGH (ref 70–99)

## 2020-07-03 MED ORDER — FREE WATER
400.0000 mL | Status: DC
  Administered 2020-07-03 – 2020-07-10 (×77): 400 mL

## 2020-07-03 NOTE — Consult Note (Addendum)
[facility] Renal Consultation Note    Indication for Consultation:  Hypernatremia  ZRA:QTMAU, Tiney Rouge, MD  HPI: Ryan Day is a 69 y.o. 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 Nephrology was consulted for persistent hypernatremia despite efforts to increase free water intake. Sodium level has trended upwards since admission (146 on admission-->156 today). Other significant labs include Chloride 117, BUN 55, Creatine 1.02 (baseline ~0.7). His current intake consists of 44mL Osmolite 1.5 4x/day + 11mL ProSource BID + 243mL q 3hrs free water flushes, which provides a total of ~3434mL/day. Due to protocols on rehab floor, intake is not consistently documented, so accurate measurement of intake is difficult to ascertain. He was also started on Fludrocortisone on 10/7 for orthostatic hypotension.   Denies personal and family history of kidney disease. Mother had "heart issues" and "swelling in her legs." On chart review, hospitalization for SCI was complicated by PEA with ROSC after 59min and acute kidney injury secondary to hypovolemia.  ROS: Per the patient, he has been experiencing increased thirst for which he is given ice chips. He feels he has been urinating large amounts. He denies dysuria but does have a foley catheter in place. He admits to a small amount of diarrhea. He denies vomiting. His nurse denies other insensible losses such as increased salivation/secretions or sweating.  Past Medical History:  Diagnosis Date  . Acute on chronic respiratory failure with hypoxia ([place])   . Acute renal injury due to hypovolemia ([place])   . Autonomic instability   . Cardiac arrest ([place])   . Cervical spinal cord injury, sequela  ([place])   . Elevated alkaline phosphatase level   . 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 Hyperlipidemia   . Hypertension    Past Surgical History:  Procedure Laterality Date  . COLONOSCOPY WITH PROPOFOL N/A 12/05/2017   Procedure: COLONOSCOPY WITH PROPOFOL;  Surgeon: Lin Landsman, MD;  Location: [facility] ENDOSCOPY;  Service: Gastroenterology;  Laterality: N/A;  . ESOPHAGOGASTRODUODENOSCOPY  12/05/2017   Procedure: ESOPHAGOGASTRODUODENOSCOPY (EGD);  Surgeon: Lin Landsman, MD;  Location: [facility] ENDOSCOPY;  Service: Gastroenterology;;   Family History  Problem Relation Age of Onset  . Cancer Father        lung  . Cancer Brother        throat  . Heart disease Brother 54  . Liver cancer Brother   . Alcohol abuse Brother    Social History:  reports that he has never smoked. He has never used smokeless tobacco. He reports current alcohol use. He reports that he does not use drugs. No Known Allergies Prior to Admission medications   Medication Sig Start Date End Date Taking? Authorizing Provider  aspirin 81 MG chewable tablet Chew 81 mg by mouth daily.   Yes [provider]  hydrochlorothiazide (HYDRODIURIL) 25 MG tablet Take 1 tablet (25 mg total) by mouth daily. 02/01/20  Yes Johnson, Megan P, DO  simvastatin (ZOCOR) 40 MG tablet Take 1 tablet (40 mg total) by mouth daily. 02/01/20  Yes Johnson, Megan P, DO   Current Facility-Administered Medications  Medication Dose Route Frequency Provider Last Rate Last Admin  . acetaminophen (TYLENOL) 160 MG/5ML solution 320-640 mg  320-640 mg Per Tube Q4H PRN Lovorn, Megan, MD   640 mg at 07/02/20 1952  . alum & mag hydroxide-simeth (  MAALOX/MYLANTA) 200-200-20 MG/5ML suspension 30 mL  30 mL Per Tube Q4H PRN Lovorn, Megan, MD   30 mL at 07/02/20 1210  . ascorbic acid (VITAMIN C) tablet 500 mg  500 mg Per Tube BID Bary Leriche, PA-C   500 mg at 07/03/20 0806  . bisacodyl (DULCOLAX) suppository 10 mg  10 mg Rectal QHS  Lovorn, Megan, MD   10 mg at 07/02/20 1749  . chlorhexidine (PERIDEX) 0.12 % solution 15 mL  15 mL Mouth Rinse BID Bary Leriche, PA-C   15 mL at 07/03/20 0806  . Chlorhexidine Gluconate Cloth 2 % PADS 6 each  6 each Topical BID Raulkar, Clide Deutscher, MD   6 each at 07/03/20 0422  . collagenase (SANTYL) ointment   Topical Daily Courtney Heys, MD   Given at 07/03/20 [phone]  . diphenhydrAMINE (BENADRYL) 12.5 MG/5ML elixir 12.5-25 mg  12.5-25 mg Per Tube Q6H PRN Lovorn, Megan, MD      . enoxaparin (LOVENOX) injection 40 mg  40 mg Subcutaneous Q24H Love, Pamela S, PA-C   40 mg at 07/02/20 1933  . famotidine (PEPCID) tablet 20 mg  20 mg Per Tube Daily Bary Leriche, PA-C   20 mg at 07/03/20 0806  . feeding supplement (OSMOLITE 1.5 CAL) liquid 474 mL  474 mL Per Tube QID Lovorn, Megan, MD   474 mL at 07/03/20 1226  . feeding supplement (PROSource TF) liquid 45 mL  45 mL Per Tube BID Lovorn, Megan, MD   45 mL at 07/03/20 0808  . fludrocortisone (FLORINEF) 0.1mg /mL oral suspension 0.1 mg  0.1 mg Per Tube Daily Lovorn, Megan, MD   0.1 mg at 07/03/20 0807  . FLUoxetine (PROZAC) 20 MG/5ML solution 40 mg  40 mg Per Tube Daily Bary Leriche, PA-C   40 mg at 07/03/20 3825  . free water 250 mL  250 mL Per Tube Q3H Lovorn, Megan, MD   250 mL at 07/03/20 1226  . guaiFENesin tablet 400 mg  400 mg Per Tube Q6H LoveIvan Anchors, PA-C   400 mg at 07/03/20 1231  . guaiFENesin-dextromethorphan (ROBITUSSIN DM) 100-10 MG/5ML syrup 5-10 mL  5-10 mL Per Tube Q6H PRN Lovorn, Megan, MD      . insulin aspart (novoLOG) injection 0-9 Units  0-9 Units Subcutaneous Q4H Bary Leriche, PA-C   3 Units at 07/03/20 1230  . lidocaine (LIDODERM) 5 % 1 patch  1 patch Transdermal Q24H Bary Leriche, PA-C   1 patch at 07/03/20 0539  . MEDLINE mouth rinse  15 mL Mouth Rinse q12n4p Bary Leriche, PA-C   15 mL at 07/03/20 1227  . midodrine (PROAMATINE) tablet 5 mg  5 mg Per Tube TID WC Lovorn, Megan, MD   5 mg at 07/03/20 1226  . multivitamin  liquid 15 mL  15 mL Per Tube Daily Bary Leriche, PA-C   15 mL at 07/03/20 0807  . neomycin-bacitracin-polymyxin (NEOSPORIN) ointment   Topical BID Courtney Heys, MD   Given at 07/03/20 0806  . nutrition supplement (JUVEN) (JUVEN) powder packet 1 packet  1 packet Per Tube BID BM Bary Leriche, PA-C   1 packet at 07/03/20 1327  . polyethylene glycol (MIRALAX / GLYCOLAX) packet 17 g  17 g Per Tube Daily PRN Lovorn, Megan, MD      . prochlorperazine (COMPAZINE) tablet 5-10 mg  5-10 mg Oral Q6H PRN Love, Pamela S, PA-C       Or  . prochlorperazine (COMPAZINE) injection  5-10 mg  5-10 mg Intramuscular Q6H PRN Love, Pamela S, PA-C       Or  . prochlorperazine (COMPAZINE) suppository 12.5 mg  12.5 mg Rectal Q6H PRN Love, Pamela S, PA-C      . saccharomyces boulardii (FLORASTOR) capsule 250 mg  250 mg Per Tube BID Bary Leriche, PA-C   250 mg at 07/03/20 0806  . scopolamine (TRANSDERM-SCOP) 1 MG/3DAYS 1.5 mg  1 patch Transdermal Q72H Love, Ivan Anchors, PA-C   1.5 mg at 07/02/20 1933  . sennosides (SENOKOT) 8.8 MG/5ML syrup 5 mL  5 mL Per Tube Q0600 Bary Leriche, PA-C   5 mL at 07/03/20 0601  . sodium phosphate (FLEET) 7-19 GM/118ML enema 1 enema  1 enema Rectal Once PRN Love, Pamela S, PA-C      . traZODone (DESYREL) tablet 50 mg  50 mg Per Tube QHS Bary Leriche, PA-C   50 mg at 07/02/20 2218  . zinc sulfate capsule 220 mg  220 mg Per Tube Daily Bary Leriche, PA-C   220 mg at 07/03/20 2426   Labs: Basic Metabolic Panel: Recent Labs  Lab 06/30/20 0536 07/02/20 0032 07/03/20 0611  NA 151* 154* 156*  K 3.6 4.9 3.8  CL 112* 117* 117*  CO2 29 25 26   GLUCOSE 97 132* 111*  BUN 46* 58* 55*  CREATININE 0.87 0.94 1.02  CALCIUM 9.9 10.1 10.0   CBC: Recent Labs  Lab 07/03/20 0611  WBC 10.3  HGB 10.2*  HCT 33.1*  MCV 94.6  PLT 314   CBG: Recent Labs  Lab 07/02/20 1958 07/03/20 0003 07/03/20 0412 07/03/20 0814 07/03/20 1156  GLUCAP 176* 229* 101* 113* 242*    Intake/Output Summary  (Last 24 hours) at 07/03/2020 1518 Last data filed at 07/03/2020 1011 Gross per 24 hour  Intake 474 ml  Output 1650 ml  Net -1176 ml    ROS: All others negative except those listed in HPI.  Physical Exam: Vitals:   07/03/20 0413 07/03/20 0846 07/03/20 1139 07/03/20 1328  BP: 99/63   90/63  Pulse: 88 81 75 73  Resp: 20 16 18 15   Temp: 97.6 F (36.4 C)   (!) 97.4 F (36.3 C)  TempSrc:      SpO2: 97% 96% 98% 98%  Weight:      Height:         General: Older African-American male sitting in wheelchair appearing uncomfortable but in no acute distress. Skin: Face appears greasy, lips and buccal mucosa are dry. Poor turgor noted in bilateral upper extremities with tenting. Head: NCAT, sclera not icteric. Neck: Supple. No lymphadenopathy. Tracheostomy collar in place. Lungs: CTA bilaterally, anteriorly. No wheeze or rales. Scattered rhonchi in upper airways. Breathing is unlabored. Heart: RRR. No murmur, rubs or gallops.  Abdomen: Soft, nontender, no guarding, no rebound tenderness. Abdominal binder in place. PEG tube noted in left upper quadrant. M/S: Quadriplegic. Flaccidity noted in bilateral upper and lower extremities. Lower extremities: Legs and feet wrapped with ace wraps. No apparent edema. Unable to appreciate wounds or skin integrity of LE. Neuro: AAOx3. Neuromuscular exam consistent with SCI. Psych:  Responds to questions appropriately with a normal affect. Appropriate mood for time and situation.   Assessment/Plan: 1. Hypernatremia/Dehydration - in the setting of recent SCI and hypovolemia. Free water deficit = 7L. Increase free water flushes to 468mL q2hrs. BMP q12hrs. Titrate flushes to Na level. Strict I/Os.  This is not diabetes insipidus, it is an adequate access to  free water and significant obligate solute losses related to high protein intake.  Should correct with titrated free water, hopefully enteral will be sufficient, but parenteral might be necessary.  Target  correction is a sodium of around 150 within the next 24 hours. 2. Orthostatic Hypotension - Likely related to hypovolemia. On fludrocortisone and midodrine.  Per CIR 3. Chronic anemia - Hgb stable at 10.2. Management per primary. 4. Neurogenic bladder and bowel - Maintain foley catheter and monitor strict I/Os. Bowel regimen as per primary. No sodium phos fleet enemas.    Rexene Agent  07/03/2020, 3:18 PM

## 2020-07-03 NOTE — Progress Notes (Signed)
Bowel program per order with dig stim and suppository, there was success with BM. Patient tolerated procedure well.

## 2020-07-03 NOTE — Progress Notes (Signed)
Physical Therapy Weekly Progress Note  Patient Details  Name: Ryan Day MRN: [id number] Date of Birth: 04/21/1951  Beginning of progress report period: June 24, 2020 End of progress report period: July 03, 2020  Today's Date: 07/03/2020 PT Individual Time: 0915-1000 PT Individual Time Calculation (min): 45 min   Patient has met 0 of 4 short term goals.  Pt is making very slow progress towards therapy goals. Pt is currently assist x 2 for rolling, supine to/from sit, and dependent for transfers via lift bed to/from wheelchair. Equipment is being obtained this week for patient to trial power wheelchair mobility. Pt remains somewhat passive in his care and needs cues to assert himself and direct caregivers. Pt also needs cues to recall pressure relief schedule to prevent further skin breakdown.  Patient continues to demonstrate the following deficits muscle weakness, muscle joint tightness and muscle paralysis, decreased cardiorespiratoy endurance, abnormal tone, unbalanced muscle activation and decreased coordination and decreased sitting balance, decreased postural control and decreased balance strategies and therefore will continue to benefit from skilled PT intervention to increase functional independence with mobility.  Patient progressing toward long term goals..  Continue plan of care.  PT Short Term Goals Week 1:  PT Short Term Goal 1 (Week 1): Pt will begin to instruct caregiver/staff in bed mobility and transfers. PT Short Term Goal 1 - Progress (Week 1): Progressing toward goal PT Short Term Goal 2 (Week 1): Pt will increase sitting tolerance on edge of bed to 30 minutes assist with ADL/mobility PT Short Term Goal 2 - Progress (Week 1): Progressing toward goal PT Short Term Goal 3 (Week 1): Pt will increase sitting balance on edge of bed to mod to max A to assist with ADLs/mobility PT Short Term Goal 3 - Progress (Week 1): Progressing toward goal PT Short Term Goal 4 (Week 1):  Pt will propel power w/c on level surfaces with S and verbal cues. PT Short Term Goal 4 - Progress (Week 1): Other (comment) (working towards obtaining proper equipment) Week 2:  PT Short Term Goal 1 (Week 2): Pt will begin instructing caregivers/staff in assisting him with bed mobility and transfers PT Short Term Goal 2 (Week 2): Pt will recall pressure relief schedule of every 30 min x 2 min PT Short Term Goal 3 (Week 2): Pt will initiate power w/c mobility  Skilled Therapeutic Interventions/Progress Updates:    Pt received seated in bed in chair position, agreeable to PT session. No complaints of pain. Seated BP 92/55. Returned bed to supine position for placement of maxi sky sling. Pt is assist x 2 for rolling R/L for placement of maxi sky sling. Maxi sky transfer to [name]. Pt reports some dizziness while sitting up during transfer. Seated BP 91/51, HR 83. Reviewed pressure relief/boosting schedule and made sign for patient to assist with recall. Pt declines placement of call button at end of session but again reiterated importance of calling for boosting every 30 min and therefore need to have call button in reach. Pt left semi-reclined in TIS w/c in room with soft touch call button placed under R shoulder and hard collar.  Therapy Documentation Precautions:  Precautions Precautions: Cervical Precaution Booklet Issued: No Precaution Comments: C collar on when egde of bed, upright or OOB Required Braces or Orthoses: Cervical Brace Cervical Brace: Hard collar, Other (comment) (C collar when edge of bed, upright or OOB) Restrictions Weight Bearing Restrictions: No   Therapy/Group: Individual Therapy   Excell Seltzer, PT, DPT 07/03/2020, 12:28  PM

## 2020-07-03 NOTE — Progress Notes (Signed)
Occupational Therapy Session Note  Patient Details  Name: Ryan Day MRN: [id number] Date of Birth: 1951/06/27  Today's Date: 07/03/2020 OT Individual Time: 0700-0810 OT Individual Time Calculation (min): 70 min    Short Term Goals: Week 2:  OT Short Term Goal 1 (Week 2): Pt will maintain static sitting balnace wiht MAX A of 1 caregiver for 3 min at EOB/EOM OT Short Term Goal 2 (Week 2): Pt will direct functional transfers with max VC OT Short Term Goal 3 (Week 2): Pt will roll B in bed with MOD +2 A  Skilled Therapeutic Interventions/Progress Updates:    BP during session with Abdominal Binder, Thigh High Ted Hose, and BLE Ace Wraps: supine-106/62; 45*-75/55 with slight s/s; 65*-96/65, 80*-105/68  OT intervention with focus on directing care and preparation for sitting upright in chair position in [place] bed. Pt requires max verbal cues to direct care. Thigh high Teds and ace wraps on BLE and abdominal binder prior to moving to sitting position. Pt using [place] more this morning and quad cough utilized X 1 (3 coughs). BP as noted above. Pt remained in [place] in chair position. All needs within reach. RN notified.     Therapy Documentation Precautions:  Precautions Precautions: Cervical Precaution Booklet Issued: No Precaution Comments: C collar on when egde of bed, upright or OOB Required Braces or Orthoses: Cervical Brace Cervical Brace: Hard collar, Other (comment) (C collar when edge of bed, upright or OOB) Restrictions Weight Bearing Restrictions: No Pain: Pain Assessment Pain Scale: 0-10 Pain Score: 0-No pain  Therapy/Group: Individual Therapy  [name] 07/03/2020, 8:09 AM

## 2020-07-03 NOTE — Consult Note (Signed)
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  Pressure Injury POA: Yes Measurement:  Healing stage 3: 4 cm x 5.2 cm x 0.5 cm, red and moist, extends across bilateral buttocks and sacrum Unstagable: 3.2 cm x 2 cm black in the upper left area. Drainage (amount, consistency, odor) None Dressing procedure/placement/frequency:  Apply to yellow and brown slough on the left buttocks. Cover all wound areas with a saline moistened gauze and cover with foam dressing. Change daily Continue current orders in from [place] for the penis: Apply to tip of penis around pressure injury from foley- tip of meatus.  Monitor the wound area(s) for worsening of condition such as: Signs/symptoms of infection, increase in size, development of or worsening of odor, development of pain, or increased pain at the affected locations.   Notify the medical team if any of these develop.  Thank you for the consult. [place] nurse will not follow at this time.   Please re-consult the [place] team if needed.  Cathlean Marseilles Tamala Julian, MSN, RN, [place], Lysle Pearl, [facility] Wound Treatment Associate Pager [phone]

## 2020-07-03 NOTE — Progress Notes (Signed)
[place] PHYSICAL MEDICINE & REHABILITATION PROGRESS NOTE   Subjective/Complaints: [name] is up to 156 this morning- nephrology consulted. He feels well. Bp is soft- may be hypovolemic contributing to hypernatremia. Other labs stable    ROS:   Pt denies SOB, abd pain, CP, N/V/C/D, and vision changes   Objective:   No results found. Recent Labs    07/03/20 0611  WBC 10.3  HGB 10.2*  HCT 33.1*  PLT 314   Recent Labs    07/02/20 0032 07/03/20 0611  Day 154* 156*  K 4.9 3.8  CL 117* 117*  CO2 25 26  GLUCOSE 132* 111*  BUN 58* 55*  CREATININE 0.94 1.02  CALCIUM 10.1 10.0    Intake/Output Summary (Last 24 hours) at 07/03/2020 0912 Last data filed at 07/03/2020 0421 Gross per 24 hour  Intake 474 ml  Output 2275 ml  Net -1801 ml     Pressure Injury 06/23/20 Coccyx Medial;Left Unstageable - Full thickness tissue loss in which the base of the injury is covered by slough (yellow, tan, gray, green or brown) and/or eschar (tan, brown or black) in the wound bed. upper left sacrum is [name] (Active)  06/23/20 1710  Location: Coccyx  Location Orientation: Medial;Left  Staging: Unstageable - Full thickness tissue loss in which the base of the injury is covered by slough (yellow, tan, gray, green or brown) and/or eschar (tan, brown or black) in the wound bed.  Wound Description (Comments): upper left sacrum is unstageable and has a leathery covering over the wound bed  Present on Admission: Yes     Pressure Injury 06/23/20 Buttocks Right Stage 3 -  Full thickness tissue loss. Subcutaneous fat may be visible but bone, tendon or muscle are NOT exposed. healing stage 3 (Active)  06/23/20 1838  Location: Buttocks  Location Orientation: Right  Staging: Stage 3 -  Full thickness tissue loss. Subcutaneous fat may be visible but bone, tendon or muscle are NOT exposed.  Wound Description (Comments): healing stage 3  Present on Admission: Yes     Pressure Injury 06/23/20  Left Stage 3 -  Full thickness tissue loss. Subcutaneous fat may be visible but bone, tendon or muscle are NOT exposed. healing stage 3 (Active)  06/23/20 1839  Location:   Location Orientation: Left  Staging: Stage 3 -  Full thickness tissue loss. Subcutaneous fat may be visible but bone, tendon or muscle are NOT exposed.  Wound Description (Comments): healing stage 3  Present on Admission: Yes     Pressure Injury 06/30/20 Penis Medial;Posterior Stage 2 -  Partial thickness loss of dermis presenting as a shallow open injury with a red, pink wound bed without slough. catheter sized indention and opening of skin  of urethral meatus on posterior side  (Active)  06/30/20 1845  Location: Penis  Location Orientation: Medial;Posterior  Staging: Stage 2 -  Partial thickness loss of dermis presenting as a shallow open injury with a red, pink wound bed without slough.  Wound Description (Comments): catheter sized indention and opening of skin  of urethral meatus on posterior side of the urethral meatus  Present on Admission:     Physical Exam: Vital Signs Blood pressure 99/63, pulse 81, temperature 97.6 F (36.4 C), resp. rate 16, height 6\' 5"  (1.956 m), weight 105.2 kg, SpO2 96 %. . General: Alert and oriented x 3, No apparent distress HEENT: trach capped-still- satting well Heart: Reg rate and rhythm. No murmurs rubs or gallops Chest: CTA bilaterally without  wheezes, rales, or rhonchi; no distress Abdomen: Soft, non-tender, non-distended, bowel sounds positive. Extremities: No clubbing, cyanosis, or edema. Pulses are 2+   Skin: Stage II/deep or superficial Stage III- more slough is resolved (not all of it)- bright pink/granulating overall-     Ox3- quiet Motor: B/l UE shoulder abduction 1+/5, distally 0/5 with ?trace flicker left wrist flexor B/l LE: 0/5 proximal to distal Sensation diminished to light touch in all extremities Psych: flat affect/quiet-      Assessment/Plan: 1.  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  Physiatrist is providing close team supervision and 24 hour management of active medical problems listed below.  Physiatrist and rehab team continue to assess barriers to discharge/monitor patient progress toward functional and medical goals  Care Tool:  Bathing              Bathing assist Assist Level: 2 Helpers     Upper Body Dressing/Undressing Upper body dressing   What is the patient wearing?: Pull over shirt    Upper body assist Assist Level: 2 Helpers    Lower Body Dressing/Undressing Lower body dressing      What is the patient wearing?: Incontinence brief     Lower body assist Assist for lower body dressing: 2 Helpers     Toileting Toileting    Toileting assist Assist for toileting: Dependent - Patient 0%     Transfers Chair/bed transfer  Transfers assist  Chair/bed transfer activity did not occur: Safety/medical concerns  Chair/bed transfer assist level: Dependent - mechanical lift     Locomotion Ambulation   Ambulation assist   Ambulation activity did not occur: Safety/medical concerns          Walk 10 feet activity   Assist           Walk 50 feet activity   Assist           Walk 150 feet activity   Assist           Walk 10 feet on uneven surface  activity   Assist           Wheelchair     Assist Will patient use wheelchair at discharge?: Yes Type of Wheelchair: Power Wheelchair activity did not occur: Safety/medical concerns         Wheelchair 50 feet with 2 turns activity    Assist    Wheelchair 50 feet with 2 turns activity did not occur: Safety/medical concerns       Wheelchair 150 feet activity     Assist  Wheelchair 150 feet activity did not occur: Safety/medical concerns       Blood pressure 99/63, pulse 81, temperature  97.6 F (36.4 C), resp. rate 16, height 6\' 5"  (1.956 m), weight 105.2 kg, SpO2 96 %.    Medical Problem List and Plan: 1.  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  Antithrombotics: -DVT/anticoagulation:  Pharmaceutical: Lovenox--check dopplers in am.  10/5- Dopplers (-)  10/10- needs for 3 months from starting it             -antiplatelet therapy: N/A 3. Pain Management: N/A 4. Mood:  LCSW to follow for evaluation and support.              -antipsychotic agents: N/A  10/10- will ask Neuropsych to see pt- when wife there, if possible.  5. Neuropsych: This patient is capable of making decisions on his own behalf. 6. Sacral decub/Skin/Wound Care: Pictures show wound in different stages of healing. Ordered Air mattress overlay. Continue vitamin and prostat to promote healing. Will add Juven also for collagen. Will increase tube feed to 50 cc/hr as likely has higher nutritional need. Will consult dietician to help address nutritional needs.   10/4- will consult dietician- BUN up to 54 from 27 and Cr 1.19 from <1- asked to evaluate dryness  10/5- haven't seen a dietician note- will check on this.  10/9- less slough, so will ask WOC to see if can change to silver alginate, or something like that.  Also ordered neosporin for new wound around Urethral meatus  10/10- waiting for wound care to see- since weekend.   10/11: appreciate WOC eval and recs 7. Fluids/Electrolytes/Nutrition: Monitor I/Os.CMP ordered. Transition to bolus tube feeds next week if stable.   10/5- very dry- have placed Dietician order- need to help pt with BUN up to 54  10/7- Day 151- will ask Nutrition to see today and reassess 10/8- changed to increase water flushes 250cc/q4 hours- Labs Sunday  10/9- labs in AM  10/10- Day 154; Cr 0.94 and BUN 58- very dry!- spoke with dietician on call- will increase  free water flushes and get off Nepro- not helping.  8. Orthostatic Hypotension: Secondary to autonomic dysfunction has resolved.              Monitor with increased activity, particularly for orthostasis.   10/3 well controlled   10/4- will get abd binder- cut a hole for PEG  10/5- will add Midodrine 5 mg TID with meals- due to BP 62I/ systolic  29/7- will add Florinef 0.1 mg daily- and titrate up as required  10/8- BP better today- less dizzy  10/11: BP soft today- abdominal binder and TEDs with therapy 9. Acute on chronic anemia: Improving--continue to monitor. Added iron supplement.              Hgb stable 10/11 10. Hypernatremia/Hyperglycemia: Due to intermittent IV dextrose. Added water flushes. May need low sodium formula.   10/4- Day 144- doing OK  10/9- Day 151- rechecking in AM  10/11: 156- nephro consulted 11. Neurogenic bladder: Maintain foley due to sacral wounds.. 12. Neurogenic bowel:Continue senna in am with suppository in evenings for bowel program. Moving bowels regularly   10/1-0 having small BM last night with program 13. Secretions/Trach/capped: continue scopolamine patch  10/5- will teach staff about quad coughing-  14. Azotemia/NPO- getting TFs  10/4- consulted dietician  10/5- don't see note from nutrition- will check on this.   10/7- BUN down to 37 from 54- and Cr <1 now- doing better  10/8- Bun up to 46- called nutrition- will con't Nepro but try to recheck Labs Sunday  10/9- labs in AM  10/10- as above- dietician increasing free water- IF no improvement, needs Nephrology consult. Also ordered strict I/o's.       LOS: 10 days A FACE TO FACE EVALUATION WAS PERFORMED  Naba Sneed P Daysha Ashmore 07/03/2020, 9:12 AM

## 2020-07-03 NOTE — Progress Notes (Signed)
Speech Language Pathology Daily Session Note  Patient Details  Name: Ryan Day MRN: [id number] Date of Birth: [birth year]  Today's Date: 07/03/2020 SLP Individual Time: 4098-1191 SLP Individual Time Calculation (min): 45 min  Short Term Goals: Week 2: SLP Short Term Goal 1 (Week 2): Patient will perform pharyngeal swallow exercises with minA. SLP Short Term Goal 2 (Week 2): Patient will perform EMST 20-25 reps at 7-8 resistance. SLP Short Term Goal 3 (Week 2): Patient will maintain adequate vocal intensity at 10-12 word phrase/sentence level. SLP Short Term Goal 4 (Week 2): Patient will manage pharyngeal residuals during ice chip trials with throat clear, hard swallow to clear as evidenced by vocal quality returning to baseline.  Skilled Therapeutic Interventions:Skilled ST services focused on swallow skills. SLP reviewed safety and tolerance of water protocol, following wife noting concerns of cough with nursing during thin liquid trials. Per chart review pt demonstrated vital signs (O2 and temperature stats WFL) since evaluation, including initiation of water protocol. Pt reported coughing noted in the morning when getting out of bed into chair verse following trials of ice chips provided by trained staff. SLP provided thin liquid trials of ice chips following oral care. Pt consumed x10 ice chips with noted piecemeal swallow, suggest possibly due to pharyngeal residue. Pt demonstrated clear vocal quality and no other overt s/s aspiration, however aspiration is noted to be silent in nature. Pt demonstrated recall pharyngeal strengthen exercises with min A verbal cues. Pt completed x10 masako exercises noting reduced laryngeal elevation, effortful swallow when consuming ice chips, and sustained phonation of "ah" for an average of 1 second, increasing to 2 seconds on one occasion. Pt completed x15 repetitions of EMST set at 7cm H20, with a self-perceived effortful level of 7 out 10, however reported  lightheadedness. SLP instructed pt to complete x10 repetitions at a time. SLP provided education of repeat MBS end of this week or early next week with plans for trach being removed tomorrow. Pt stated agreement. SLP readjusted suction and adaptive call bell blower. Pt was left in room with call bell within reach and chair alarm set. SLP recommends to continue skilled services.     Pain Pain Assessment Pain Scale: 0-10 Pain Score: 0-No pain  Therapy/Group: Individual Therapy  Ryan Day  [facility] 07/03/2020, 7:51 AM

## 2020-07-03 NOTE — Progress Notes (Signed)
Occupational Therapy Session Note  Patient Details  Name: Ryan Day MRN: [id number] Date of Birth: March 02, 1951  Today's Date: 07/03/2020 OT Individual Time: 1345-1430 OT Individual Time Calculation (min): 45 min    Short Term Goals: Week 2:  OT Short Term Goal 1 (Week 2): Pt will maintain static sitting balnace wiht MAX A of 1 caregiver for 3 min at EOB/EOM OT Short Term Goal 2 (Week 2): Pt will direct functional transfers with max VC OT Short Term Goal 3 (Week 2): Pt will roll B in bed with MOD +2 A  Skilled Therapeutic Interventions/Progress Updates:    Pt resting in TIS w/c upon arrival and requesting to return to bed.  Pt had been up in w/c since 0945. Pt required max verbal cues for directing transfer but min verbal cues for positioning in bed. Pt required tot A+2 for rolling in bed to facilitate positioning. Pt requested puff call bell and positioned. Pt able to activate call bell. Pt remained in bed with suction positioned for pt's use.  Therapy Documentation Precautions:  Precautions Precautions: Cervical Precaution Booklet Issued: No Precaution Comments: C collar on when egde of bed, upright or OOB Required Braces or Orthoses: Cervical Brace Cervical Brace: Hard collar, Other (comment) (C collar when edge of bed, upright or OOB) Restrictions Weight Bearing Restrictions: No Pain: Pain Assessment Pain Score: 0-No pain   Therapy/Group: Individual Therapy  [name] 07/03/2020, 2:39 PM

## 2020-07-04 ENCOUNTER — Inpatient Hospital Stay (HOSPITAL_COMMUNITY): Payer: BC Managed Care – PPO | Admitting: *Deleted

## 2020-07-04 ENCOUNTER — Inpatient Hospital Stay (HOSPITAL_COMMUNITY): Payer: BC Managed Care – PPO | Admitting: Physical Therapy

## 2020-07-04 ENCOUNTER — Inpatient Hospital Stay (HOSPITAL_COMMUNITY): Payer: BC Managed Care – PPO

## 2020-07-04 DIAGNOSIS — S14109S Unspecified injury at unspecified level of cervical spinal cord, sequela: Secondary | ICD-10-CM | POA: Diagnosis not present

## 2020-07-04 LAB — GLUCOSE, CAPILLARY
Glucose-Capillary: 102 mg/dL — ABNORMAL HIGH (ref 70–99)
Glucose-Capillary: 108 mg/dL — ABNORMAL HIGH (ref 70–99)
Glucose-Capillary: 155 mg/dL — ABNORMAL HIGH (ref 70–99)
Glucose-Capillary: 172 mg/dL — ABNORMAL HIGH (ref 70–99)
Glucose-Capillary: 189 mg/dL — ABNORMAL HIGH (ref 70–99)
Glucose-Capillary: 223 mg/dL — ABNORMAL HIGH (ref 70–99)
Glucose-Capillary: 93 mg/dL (ref 70–99)

## 2020-07-04 LAB — BASIC METABOLIC PANEL
Anion gap: 9 (ref 5–15)
BUN: 50 mg/dL — ABNORMAL HIGH (ref 8–23)
CO2: 31 mmol/L (ref 22–32)
Calcium: 9.8 mg/dL (ref 8.9–10.3)
Chloride: 114 mmol/L — ABNORMAL HIGH (ref 98–111)
Creatinine, Ser: 0.97 mg/dL (ref 0.61–1.24)
GFR, Estimated: >60 mL/min (ref >60)
Glucose, Bld: 102 mg/dL — ABNORMAL HIGH (ref 70–99)
Potassium: 3.6 mmol/L (ref 3.5–5.1)
Sodium: 154 mmol/L — ABNORMAL HIGH (ref 135–145)

## 2020-07-04 MED ORDER — LIDOCAINE HCL URETHRAL/MUCOSAL 2 % EX GEL
1.0000 "application " | Freq: Once | CUTANEOUS | Status: AC
  Administered 2020-07-04: 1 via TOPICAL
  Filled 2020-07-04: qty 5

## 2020-07-04 MED ORDER — FLUDROCORTISONE 0.1 MG/ML ORAL SUSPENSION
0.2000 mg | Freq: Every day | ORAL | Status: DC
  Administered 2020-07-05 – 2020-07-20 (×16): 0.2 mg
  Filled 2020-07-04 (×16): qty 2

## 2020-07-04 NOTE — Progress Notes (Signed)
Speech Language Pathology Daily Session Note  Patient Details  Name: Ryan Day MRN: [id number] Date of Birth: 27-May-1951  Today's Date: 07/04/2020 SLP Individual Time: 3664-4034 SLP Individual Time Calculation (min): 29 min  Short Term Goals: Week 2: SLP Short Term Goal 1 (Week 2): Patient will perform pharyngeal swallow exercises with minA. SLP Short Term Goal 2 (Week 2): Patient will perform EMST 20-25 reps at 7-8 resistance. SLP Short Term Goal 3 (Week 2): Patient will maintain adequate vocal intensity at 10-12 word phrase/sentence level. SLP Short Term Goal 4 (Week 2): Patient will manage pharyngeal residuals during ice chip trials with throat clear, hard swallow to clear as evidenced by vocal quality returning to baseline.  Skilled Therapeutic Interventions:Skilled ST services focused on education and swallow skills. Ryan Day was present for treatment. SLP provided education for continuing water protocol and plan for repeat MBS end of this week or early next week. All questions were answered to satisfaction. Pt reported headache, therefore EMSt exercises were not completed. SLP provided oral care piror to trials of ice chips. Pt preformed effortful swallows with min A verbal cues when consuming ice chips with no overt s/s aspiration including clear vocal quality. Pt preformed x10 [name] exercises and sustained phonation of "ah" and "ee" for 1.5 seconds maximum. Pt was left in room with wife, call bell within reach and bed alarm set. SLP recommends to continue skilled services.     Pain Pain Assessment Pain Scale: 0-10 Pain Score: 0-No pain Pain Type: Acute pain Pain Location: Head Pain Descriptors / Indicators: Aching Pain Frequency: Occasional Pain Onset: Gradual Patients Stated Pain Goal: 1 Pain Intervention(s): Medication (See eMAR)  Therapy/Group: Individual Therapy  Ryan Day  [facility] 07/04/2020, 3:45 PM

## 2020-07-04 NOTE — Progress Notes (Signed)
[place] PHYSICAL MEDICINE & REHABILITATION PROGRESS NOTE   Subjective/Complaints:  Pt was seen by nephrology who increased his free water.  Na down to 154 from 156 this AM.   BUN down to 50 and Cr 0.97- so still dry.  Required deep/tracheal suctioning yesterday so trach was not removed- will need to restart "countdown" until can remove trach- only ~50% of pt's can get trach out.  Also needed suctioning by nursing this AM.   sats 95-98% on RA- wet cough before suctioning.  Very quiet/mild cough- no diaphragm strength behind it.    ROS:   Pt denies SOB, abd pain, CP, N/V/C/D, and vision changes   Objective:   No results found. Recent Labs    07/03/20 0611  WBC 10.3  HGB 10.2*  HCT 33.1*  PLT 314   Recent Labs    07/03/20 1913 07/04/20 0443  NA 144 154*  K 3.9 3.6  CL 114* 114*  CO2 19* 31  GLUCOSE 173* 102*  BUN 22 50*  CREATININE 0.83 0.97  CALCIUM 9.0 9.8    Intake/Output Summary (Last 24 hours) at 07/04/2020 1042 Last data filed at 07/03/2020 2123 Gross per 24 hour  Intake 948 ml  Output 1300 ml  Net -352 ml     Pressure Injury 06/23/20 Coccyx Medial;Left Unstageable - Full thickness tissue loss in which the base of the injury is covered by slough (yellow, tan, gray, green or brown) and/or eschar (tan, brown or black) in the wound bed. upper left sacrum is [name] (Active)  06/23/20 1710  Location: Coccyx  Location Orientation: Medial;Left  Staging: Unstageable - Full thickness tissue loss in which the base of the injury is covered by slough (yellow, tan, gray, green or brown) and/or eschar (tan, brown or black) in the wound bed.  Wound Description (Comments): upper left sacrum is unstageable and has a leathery covering over the wound bed  Present on Admission: Yes     Pressure Injury 06/23/20 Buttocks Right Stage 3 -  Full thickness tissue loss. Subcutaneous fat may be visible but bone, tendon or muscle are NOT exposed. healing stage 3 (Active)    06/23/20 1838  Location: Buttocks  Location Orientation: Right  Staging: Stage 3 -  Full thickness tissue loss. Subcutaneous fat may be visible but bone, tendon or muscle are NOT exposed.  Wound Description (Comments): healing stage 3  Present on Admission: Yes     Pressure Injury 06/23/20 Left Stage 3 -  Full thickness tissue loss. Subcutaneous fat may be visible but bone, tendon or muscle are NOT exposed. healing stage 3 (Active)  06/23/20 1839  Location:   Location Orientation: Left  Staging: Stage 3 -  Full thickness tissue loss. Subcutaneous fat may be visible but bone, tendon or muscle are NOT exposed.  Wound Description (Comments): healing stage 3  Present on Admission: Yes     Pressure Injury 06/30/20 Penis Medial;Posterior Stage 2 -  Partial thickness loss of dermis presenting as a shallow open injury with a red, pink wound bed without slough. catheter sized indention and opening of skin  of urethral meatus on posterior side  (Active)  06/30/20 1845  Location: Penis  Location Orientation: Medial;Posterior  Staging: Stage 2 -  Partial thickness loss of dermis presenting as a shallow open injury with a red, pink wound bed without slough.  Wound Description (Comments): catheter sized indention and opening of skin  of urethral meatus on posterior side of the urethral meatus  Present on Admission:  Physical Exam: Vital Signs Blood pressure (!) 102/52, pulse 75, temperature 98.8 F (37.1 C), temperature source Oral, resp. rate 20, height 6\' 5"  (1.956 m), weight 105.2 kg, SpO2 95 %. . General: sitting up in bed at 30+%- appropriate, doesn't complain of anything, RN in room, NAD HEENT: trach capped-still- sats 95-98% Heart: RRR Chest: pt has adequate air movement, but decreased, esp at bases- very coarse- a few wheezes Abdomen: Soft, NT, ND, (+)BS  ; PEG- being given TFs currently Extremities: No clubbing, cyanosis, or edema. Pulses are 2+   Skin: Stage II/deep or  superficial Stage III- more slough is resolved (not all of it)- bright pink/granulating overall-     Ox3- quiet Motor: B/l UE shoulder abduction 1+/5, distally 0/5 with ?trace flicker left wrist flexor B/l LE: 0/5 proximal to distal Sensation diminished to light touch in all extremities Psych: quiet     Assessment/Plan: 1. 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  Physiatrist is providing close team supervision and 24 hour management of active medical problems listed below.  Physiatrist and rehab team continue to assess barriers to discharge/monitor patient progress toward functional and medical goals  Care Tool:  Bathing              Bathing assist Assist Level: 2 Helpers     Upper Body Dressing/Undressing Upper body dressing   What is the patient wearing?: Pull over shirt    Upper body assist Assist Level: 2 Helpers    Lower Body Dressing/Undressing Lower body dressing      What is the patient wearing?: Incontinence brief     Lower body assist Assist for lower body dressing: 2 Helpers     Toileting Toileting    Toileting assist Assist for toileting: Dependent - Patient 0%     Transfers Chair/bed transfer  Transfers assist  Chair/bed transfer activity did not occur: Safety/medical concerns  Chair/bed transfer assist level: Dependent - mechanical lift     Locomotion Ambulation   Ambulation assist   Ambulation activity did not occur: Safety/medical concerns          Walk 10 feet activity   Assist           Walk 50 feet activity   Assist           Walk 150 feet activity   Assist           Walk 10 feet on uneven surface  activity   Assist           Wheelchair     Assist Will patient use wheelchair at discharge?: Yes Type of Wheelchair: Power Wheelchair activity did not occur: Safety/medical  concerns         Wheelchair 50 feet with 2 turns activity    Assist    Wheelchair 50 feet with 2 turns activity did not occur: Safety/medical concerns       Wheelchair 150 feet activity     Assist  Wheelchair 150 feet activity did not occur: Safety/medical concerns       Blood pressure (!) 102/52, pulse 75, temperature 98.8 F (37.1 C), temperature source Oral, resp. rate 20, height 6\' 5"  (1.956 m), weight 105.2 kg, SpO2 95 %.    Medical Problem List and Plan: 1.  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  Antithrombotics: -DVT/anticoagulation:  Pharmaceutical: Lovenox--check dopplers in am.  10/5- Dopplers (-)  10/10- needs for 3 months from starting it             -antiplatelet therapy: N/A 3. Pain Management: N/A 4. Mood: LCSW to follow for evaluation and support.              -antipsychotic agents: N/A  10/10- will ask Neuropsych to see pt- when wife there, if possible.   10/12- emailed SW to put on schedule 5. Neuropsych: This patient is capable of making decisions on his own behalf. 6. Sacral decub/Skin/Wound Care: Pictures show wound in different stages of healing. Ordered Air mattress overlay. Continue vitamin and prostat to promote healing. Will add Juven also for collagen. Will increase tube feed to 50 cc/hr as likely has higher nutritional need. Will consult dietician to help address nutritional needs.   10/4- will consult dietician- BUN up to 54 from 27 and Cr 1.19 from <1- asked to evaluate dryness  10/5- haven't seen a dietician note- will check on this.  10/9- less slough, so will ask WOC to see if can change to silver alginate, or something like that.  Also ordered neosporin for new wound around Urethral meatus  10/10- waiting for wound care to see- since weekend.   10/11: appreciate WOC eval and recs 7.  Fluids/Electrolytes/Nutrition: Monitor I/Os.CMP ordered. Transition to bolus tube feeds next week if stable.   10/5- very dry- have placed Dietician order- need to help pt with BUN up to 54  10/7- Na 151- will ask Nutrition to see today and reassess 10/8- changed to increase water flushes 250cc/q4 hours- Labs Sunday  10/9- labs in AM  10/10- Na 154; Cr 0.94 and BUN 58- very dry!- spoke with dietician on call- will increase free water flushes and get off Nepro- not helping.   10/12- Na down to 154 from 156- nephrology has put on 400cc q2 hours- nephrology following 8. Orthostatic Hypotension: Secondary to autonomic dysfunction has resolved.              Monitor with increased activity, particularly for orthostasis.   10/3 well controlled   10/4- will get abd binder- cut a hole for PEG  10/5- will add Midodrine 5 mg TID with meals- due to BP 46N/ systolic  62/9- will add Florinef 0.1 mg daily- and titrate up as required  10/8- BP better today- less dizzy  10/11: BP soft today- abdominal binder and TEDs with therapy  10/12- BP soft still, at 30 degrees- will increase florinef to 0.2 mg daily.  9. Acute on chronic anemia: Improving--continue to monitor. Added iron supplement.              Hgb stable 10/11 10. Hypernatremia/Hyperglycemia: Due to intermittent IV dextrose. Added water flushes. May need low sodium formula.   10/4- Na 144- doing OK  10/9- Na 151- rechecking in AM  10/11: 156- nephro consulted 11. Neurogenic bladder: Maintain foley due to sacral wounds.. 12. Neurogenic bowel:Continue senna in am with suppository in evenings for bowel program. Moving bowels regularly   10/1-0 having small BM last night with program 13. Secretions/Trach/capped: continue scopolamine patch  10/5- will teach staff about quad coughing-   10/12- cannot remove trach at this time due to trach suctioning pt requires 14. Azotemia/NPO-  getting TFs  10/4- consulted dietician  10/5- don't see note from  nutrition- will check on this.   10/7- BUN down to 37 from 54- and Cr <1 now- doing better  10/8- Bun up to 46- called nutrition- will con't Nepro but try to recheck Labs Sunday  10/9- labs in AM  10/10- as above- dietician increasing free water- IF no improvement, needs Nephrology consult. Also ordered strict I/o's.  10/12- Nephrology on board- Na back down to 154- BUN down to 50  15. W/C requirement- will need either head array OR chin control power w/c- have already discussed with w/c rep/company- they are working on getting a demo for him.        LOS: 11 days A FACE TO FACE EVALUATION WAS PERFORMED  Mahki Spikes 07/04/2020, 10:42 AM

## 2020-07-04 NOTE — Progress Notes (Signed)
Patient ID: Ryan Day, male   DOB: [birth year], 69 y.o.   MRN: [id number]  SW met with pt and pt wife Ryan Day in room to provide updates from team conference, d/c date 10/28, and family education. SW informed there will be updates on if trach will be removed prior to d/c. Wife to discuss with her daughters when they will be able to come in for family education, and inform SW on dates.   [name], MSW, [place] Office: [phone] Cell: [phone] Fax: [phone]

## 2020-07-04 NOTE — Progress Notes (Signed)
Occupational Therapy Session Note  Patient Details  Name: Ryan Day MRN: [id number] Date of Birth: [birth year]  Today's Date: 07/04/2020 OT Individual Time: 1300-1325 OT Individual Time Calculation (min): 25 min    Short Term Goals: Week 2:  OT Short Term Goal 1 (Week 2): Pt will maintain static sitting balnace wiht MAX A of 1 caregiver for 3 min at EOB/EOM OT Short Term Goal 2 (Week 2): Pt will direct functional transfers with max VC OT Short Term Goal 3 (Week 2): Pt will roll B in bed with MOD +2 A  Skilled Therapeutic Interventions/Progress Updates:    Pt resting in bed upon arrival, suction and puff call bell in place, wife present. Pt commented that he "just wasn't feeling good today." Pt c/o ongoing "loopy feeling." BP 132/68 with HOB at 30*. Pt stated he didn't want to get in w/c or in chair position in bed.  Initiated education with wife regarding transfers with Harrel Lemon, self care, and positioning in bed. Wife stated she is just overwhelmed right now. Emotional support provided and reassured wife that we would assure she and her family will be prepared before pt discharge home. Pt remained in bed with wife at bedside.  Therapy Documentation Precautions:  Precautions Precautions: Cervical Precaution Booklet Issued: No Precaution Comments: C collar on when egde of bed, upright or OOB Required Braces or Orthoses: Cervical Brace Cervical Brace: Hard collar, Other (comment) (C collar when edge of bed, upright or OOB) Restrictions Weight Bearing Restrictions: No Pain:  Pt denies pain this afternoon   Therapy/Group: Individual Therapy  [name] 07/04/2020, 1:28 PM

## 2020-07-04 NOTE — Progress Notes (Signed)
[facility] Progress Note   Subjective:  Patient seen in room sitting in chair working with PT. States thirst is about the same as yesterday. Tolerated fluid boluses via PEG without vomiting.  Sodium level down to 154 this morning from 156 (level of 144 likely spurious). BUN also downtrending to 50 from 55.  Denies chest pain, SOB, diarrhea.    Objective Vitals:   07/04/20 0434 07/04/20 0830 07/04/20 0901 07/04/20 1133  BP: 107/63 (!) 102/52    Pulse: 76 78 75 74  Resp: 19  20 18   Temp: 98.8 F (37.1 C)     TempSrc: Oral     SpO2: 98%  95% 98%  Weight:      Height:       Physical Exam General: Older African-American male performing therapies in wheelchair and in no acute distress. Skin: Face appears greasy, lips and buccal mucosa are dry. Poor turgor noted in bilateral upper extremities with tenting. Head: NCAT, sclera not icteric. Neck: Supple. No lymphadenopathy. Tracheostomy collar in place. Lungs: CTA bilaterally, anteriorly. No wheeze or rales. Scattered rhonchi in upper airways. Breathing is unlabored. Heart: RRR. No murmur, rubs or gallops.  Abdomen: Soft, nontender, no guarding, no rebound tenderness. Abdominal binder in place. PEG tube noted in left upper quadrant. M/S: Quadriplegic. Flaccidity noted in bilateral upper and lower extremities. Lower extremities: Legs and feet wrapped with ace wraps. No apparent edema. Unable to appreciate wounds or skin integrity of LE. Neuro: AAOx3. Neuromuscular exam consistent with SCI. Psych:  Responds to questions appropriately with a normal affect. Appropriate mood for time and situation.  Filed Weights   06/23/20 1730  Weight: 105.2 kg    Intake/Output Summary (Last 24 hours) at 07/04/2020 1148 Last data filed at 07/03/2020 2123 Gross per 24 hour  Intake 948 ml  Output 1300 ml  Net -352 ml    Additional Objective Labs: Basic Metabolic Panel: Recent Labs  Lab 07/03/20 0611 07/03/20 1913 07/04/20 0443   NA 156* 144 154*  K 3.8 3.9 3.6  CL 117* 114* 114*  CO2 26 19* 31  GLUCOSE 111* 173* 102*  BUN 55* 22 50*  CREATININE 1.02 0.83 0.97  CALCIUM 10.0 9.0 9.8   CBC: Recent Labs  Lab 07/03/20 0611  WBC 10.3  HGB 10.2*  HCT 33.1*  MCV 94.6  PLT 314    CBG: Recent Labs  Lab 07/03/20 2008 07/04/20 0021 07/04/20 0416 07/04/20 0750 07/04/20 1104  GLUCAP 185* 223* 93 102* 155*   Medications:  . vitamin C  500 mg Per Tube BID  . bisacodyl  10 mg Rectal QHS  . chlorhexidine  15 mL Mouth Rinse BID  . Chlorhexidine Gluconate Cloth  6 each Topical BID  . collagenase   Topical Daily  . enoxaparin (LOVENOX) injection  40 mg Subcutaneous Q24H  . famotidine  20 mg Per Tube Daily  . feeding supplement (OSMOLITE 1.5 CAL)  474 mL Per Tube QID  . feeding supplement (PROSource TF)  45 mL Per Tube BID  . [START ON 07/05/2020] fludrocortisone  0.2 mg Per Tube Daily  . FLUoxetine  40 mg Per Tube Daily  . free water  400 mL Per Tube Q2H  . guaiFENesin  400 mg Per Tube Q6H  . insulin aspart  0-9 Units Subcutaneous Q4H  . lidocaine  1 patch Transdermal Q24H  . mouth rinse  15 mL Mouth Rinse q12n4p  . midodrine  5 mg Per Tube TID WC  . multivitamin  15 mL Per Tube Daily  . neomycin-bacitracin-polymyxin   Topical BID  . nutrition supplement (JUVEN)  1 packet Per Tube BID BM  . saccharomyces boulardii  250 mg Per Tube BID  . scopolamine  1 patch Transdermal Q72H  . sennosides  5 mL Per Tube Q0600  . traZODone  50 mg Per Tube QHS  . zinc sulfate  220 mg Per Tube Daily    Assessment/Plan: 1. Hypernatremia/Dehydration - in the setting of recent SCI and hypovolemia. Free water deficit = 6L. Continue free water flushes to 424mL q2hrs. Continue BMP q12hrs. Titrate flushes to Na level. Strict I/Os.  Once cleared to PO from ST standpoint, allow patient to have unlimited access to free water. This is not diabetes insipidus, it is inadequate access to free water and significant obligate solute  losses related to high protein intake. Should correct with titrated free water, hopefully enteral will be sufficient, but parenteral might be necessary.  Target correction is a sodium of around 145 to 150 within the next 24 hours. 2. Orthostatic Hypotension - Likely related to hypovolemia. On fludrocortisone and midodrine.  Per CIR 3. Chronic anemia - Hgb stable at 10.2. Management per primary. 4. Neurogenic bladder and bowel - Maintain foley catheter and monitor strict I/Os. Bowel regimen as per primary. No sodium phos fleet enemas.  Aundra Pung B Zyiah Withington  07/04/2020,11:48 AM  LOS: 11 days

## 2020-07-04 NOTE — Patient Care Conference (Signed)
Inpatient RehabilitationTeam Conference and Plan of Care Update Date: 07/04/2020   Time: 11:16 AM    Patient Name: Ryan Day      Medical Record Number: [id number]  Date of Birth: June 15, 1951 Sex: Male         Room/Bed: 4W15C/4W15C-01 Payor Info: Payor: MEDICARE / Plan: MEDICARE PART A / Product Type: *No Product type* /    Admit Date/Time:  06/23/2020  4:40 PM  Primary Diagnosis:  Spinal cord injury, cervical region, sequela [facility])  Hospital Problems: Principal Problem:   Spinal cord injury, cervical region, sequela [facility]) Active Problems:   Pressure injury of skin    Expected Discharge Date: Expected Discharge Date: 07/20/20  Team Members Present: Physician leading conference: Dr. Courtney Heys Care Coodinator Present: [name], LCSWA;Aashir Umholtz Creig Hines, RN, BSN, [place] Nurse Present: Suella Grove, RN PT Present: Excell Seltzer, PT OT Present: Willeen Cass, OT;Roanna Epley, COTA SLP Present: Charolett Bumpers, SLP PPS Coordinator present : Gunnar Fusi, Novella Olive, PT     Current Status/Progress Goal Weekly Team Focus  Bowel/Bladder   Foley patent, continue daily bowel program, LBM 07/03/20  Continue bowel protocol with results  Assess results of Bowel program QS, continue foley   Swallow/Nutrition/ Hydration   NPO, water protocol (vital s/s [facility])  Supervision A  Repeat MBS end of this week/early next week, pharyngeal strength exercies, RMT, ice chip trials   ADL's   tot A+2 for bed mobility, +2 for supine>sit, dependent transfers via lift  dynamic sitting balance-mod A; self feeding-max A; grooming-supervision; directing care with BADLs and transfers-supervision  activity tolerance, sitting balance, education,   Mobility   +2 rolling, +2 supine to/from sit, transfer dependently via lift  mod I at [facility] level, family independent with assisting pt with transfers  BP management, PWC evaluation, family education   Communication   phrase level mod A  Supervision A   EMST, maintaining vocal intensity, clarfying message   Safety/Cognition/ Behavioral Observations            Pain   Denies Pain  pain score <3/10  Assess pain QS and PRN   Skin   Stage 2 - right buttocks foam dressing,foam dressing ,pressure injury to penis site improving  No new breakdown  QS/PRN skin assessment monitor for changes     Discharge Planning:  D/c to home with his wife who plans on providing 24/7 care. She does report having back issues, but is still willing to provide care.   Team Discussion: Foley patent, continue daily bowel program, no c/o pain. Stage 2 to sacrum with foam dressing, Pressure Injury to penis (device related), is improving. Lurline Idol still needs suctioning, Quad cough not producing much. OT reports patient is passive and not good at directing own care. PT reports +2 for rolling, and mechanical lift. SLP reports patient gets 25-30 ice chips, working on RMT, pharyngeal strengthening exercises, repeat MBS possible this week or early next week.  Patient on target to meet rehab goals: yes, with slow progression  *See Care Plan and progress notes for long and short-term goals.   Revisions to Treatment Plan:  MD requested Strict I&O's, therapy put in for Power Chair eval.  Teaching Needs: Continue with family education.  Current Barriers to Discharge: Home enviroment access/layout, Neurogenic bowel and bladder, Wound care, Lack of/limited family support, Weight bearing restrictions, Behavior and Nutritional means  Possible Resolutions to Barriers: Encourage patient to advocate for himself, continue and teach foley education, and bowel program, continue tube feeds and nutritional supplements.  Medical Summary Current Status: Na 154- dry- no pain; unstageable on buttocks/ Stage II on penis and santyl and wet to dry on other wounds; increased water boluses; low /soft orthostatic hypotension  Barriers to Discharge: Decreased family/caregiver support;Home  enviroment access/layout;Neurogenic Bowel & Bladder;Medical stability;Nutrition means;Weight bearing restrictions;Wound care  Barriers to Discharge Comments: On list for neuropsych tomorrow; +2 for therapy/transfers with lift- w/c evaluation today head array today/tomorrow Possible Resolutions to [employer] Focus: strict I/o's; wound care; increased florinef to 0.2 mg daily- not strong advocate for himself.  d/c 10/28- SLP- NPO- after MBS- MBS late this week/early next week. doing RMT   Continued Need for Acute Rehabilitation Level of Care: The patient requires daily medical management by a physician with specialized training in physical medicine and rehabilitation for the following reasons: Direction of a multidisciplinary physical rehabilitation program to maximize functional independence : Yes Medical management of patient stability for increased activity during participation in an intensive rehabilitation regime.: Yes Analysis of laboratory values and/or radiology reports with any subsequent need for medication adjustment and/or medical intervention. : Yes   I attest that I was present, lead the team conference, and concur with the assessment and plan of the team.   Cristi Loron 07/04/2020, 3:57 PM

## 2020-07-04 NOTE — Progress Notes (Signed)
Patient had a moderate soft stool s/p bowel program, personal care provided,

## 2020-07-04 NOTE — Progress Notes (Signed)
Physical Therapy Session Note  Patient Details  Name: Ryan Day MRN: [id number] Date of Birth: [birth year]  Today's Date: 07/04/2020 PT Individual Time: 1000-1100 PT Individual Time Calculation (min): 60 min   Short Term Goals: Week 2:  PT Short Term Goal 1 (Week 2): Pt will begin instructing caregivers/staff in assisting him with bed mobility and transfers PT Short Term Goal 2 (Week 2): Pt will recall pressure relief schedule of every 30 min x 2 min PT Short Term Goal 3 (Week 2): Pt will initiate power w/c mobility  Skilled Therapeutic Interventions/Progress Updates:    Pt received seated in bed, agreeable to PT session. No complaints of pain. [name], ATP present for initiating power wheelchair evaluation. Discussion with patient and ATP regarding pt's current level of function and options for independence with PWC mobility. Jason to set up head array PWC for patient to trial this week. [name] arrives later in session and updated her on status of trialing PWC mobility. Semi-reclined BP 114/64 with use of thigh-high TEDs and BLE ACE wrap. Pt appears more fatigued this session, declines to transfer OOB to Crane chair. Donned abdominal binder prior to mobility this session. Supine to sit with assist x 2 for BLE management and trunk control. Pt initially reports dizziness rated 7/10 upon sitting up. Seated BP 101/74. Pt reports dizziness decreases to 5/10 in sitting. Pt returned to supine after sitting up x 5 min. Supine BP 110/66. Pt then found to be incontinent of BM. Rolling L/R with assist x 2 for dependent pericare and brief change. Nursing notified that wound dressing on sacrum soiled and able to change. Pt left semi-reclined in bed with needs in reach at end of session, wife present.  Therapy Documentation Precautions:  Precautions Precautions: Cervical Precaution Booklet Issued: No Precaution Comments: C collar on when egde of bed, upright or OOB Required Braces or Orthoses:  Cervical Brace Cervical Brace: Hard collar, Other (comment) (C collar when edge of bed, upright or OOB) Restrictions Weight Bearing Restrictions: No    Therapy/Group: Individual Therapy   Excell Seltzer, PT, DPT  07/04/2020, 12:21 PM

## 2020-07-04 NOTE — Progress Notes (Signed)
Occupational Therapy Session Note  Patient Details  Name: Ryan Day MRN: [id number] Date of Birth: May 25, 1951  Today's Date: 07/04/2020 OT Individual Time: 0700-0810 OT Individual Time Calculation (min): 70 min    Short Term Goals: Week 2:  OT Short Term Goal 1 (Week 2): Pt will maintain static sitting balnace wiht MAX A of 1 caregiver for 3 min at EOB/EOM OT Short Term Goal 2 (Week 2): Pt will direct functional transfers with max VC OT Short Term Goal 3 (Week 2): Pt will roll B in bed with MOD +2 A  Skilled Therapeutic Interventions/Progress Updates:    Pt resting in bed upon arrival. Suction and call bell in place. BP supine-92/59. BP after donning ted hose, ace wraps, and abdominal binder-96/59 with HOB 30*. Recheck after 5 mins-84/55. RN notified and manual BP 102/52. RN performed quad cough but pt unable to clear. Pt using suction more frequently this morning. Pt repositioned in bed. HOB at 30*. RN attending to administer medications. Pt alert and responsive to questions. Voice volume weak. RN commented to let pt remain in bed for now.   Therapy Documentation Precautions:  Precautions Precautions: Cervical Precaution Booklet Issued: No Precaution Comments: C collar on when egde of bed, upright or OOB Required Braces or Orthoses: Cervical Brace Cervical Brace: Hard collar, Other (comment) (C collar when edge of bed, upright or OOB) Restrictions Weight Bearing Restrictions: No Pain: Pain Assessment Pain Score: 0-No pain   Therapy/Group: Individual Therapy  [name] 07/04/2020, 8:11 AM

## 2020-07-04 NOTE — Progress Notes (Signed)
Patient, alert and oriented approach several times by NT and assigned RN regarding bath and ADL's' refused, stating he will do his bath tomorrow, supported provided

## 2020-07-05 ENCOUNTER — Encounter (HOSPITAL_COMMUNITY): Payer: BC Managed Care – PPO | Admitting: Psychology

## 2020-07-05 ENCOUNTER — Inpatient Hospital Stay (HOSPITAL_COMMUNITY): Payer: BC Managed Care – PPO | Admitting: Physical Therapy

## 2020-07-05 ENCOUNTER — Inpatient Hospital Stay (HOSPITAL_COMMUNITY): Payer: BC Managed Care – PPO

## 2020-07-05 ENCOUNTER — Inpatient Hospital Stay (HOSPITAL_COMMUNITY): Payer: BC Managed Care – PPO | Admitting: Speech Pathology

## 2020-07-05 DIAGNOSIS — S14109S Unspecified injury at unspecified level of cervical spinal cord, sequela: Secondary | ICD-10-CM | POA: Diagnosis not present

## 2020-07-05 LAB — BASIC METABOLIC PANEL
Anion gap: 11 (ref 5–15)
BUN: 42 mg/dL — ABNORMAL HIGH (ref 8–23)
CO2: 27 mmol/L (ref 22–32)
Calcium: 9.3 mg/dL (ref 8.9–10.3)
Chloride: 109 mmol/L (ref 98–111)
Creatinine, Ser: 0.95 mg/dL (ref 0.61–1.24)
GFR, Estimated: >60 mL/min (ref >60)
Glucose, Bld: 239 mg/dL — ABNORMAL HIGH (ref 70–99)
Potassium: 3.4 mmol/L — ABNORMAL LOW (ref 3.5–5.1)
Sodium: 147 mmol/L — ABNORMAL HIGH (ref 135–145)

## 2020-07-05 LAB — GLUCOSE, CAPILLARY
Glucose-Capillary: 89 mg/dL (ref 70–99)
Glucose-Capillary: 124 mg/dL — ABNORMAL HIGH (ref 70–99)
Glucose-Capillary: 223 mg/dL — ABNORMAL HIGH (ref 70–99)
Glucose-Capillary: 108 mg/dL — ABNORMAL HIGH (ref 70–99)
Glucose-Capillary: 234 mg/dL — ABNORMAL HIGH (ref 70–99)

## 2020-07-05 NOTE — Progress Notes (Signed)
Occupational Therapy Session Note  Patient Details  Name: Ryan Day MRN: [id number] Date of Birth: 1951/06/07  Today's Date: 07/05/2020 OT Individual Time: 0930-1040 OT Individual Time Calculation (min): 70 min    Short Term Goals: Week 2:  OT Short Term Goal 1 (Week 2): Pt will maintain static sitting balnace wiht MAX A of 1 caregiver for 3 min at EOB/EOM OT Short Term Goal 2 (Week 2): Pt will direct functional transfers with max VC OT Short Term Goal 3 (Week 2): Pt will roll B in bed with MOD +2 A  Skilled Therapeutic Interventions/Progress Updates:    Pt resting in bed upon arrival with wife present. Pt agreeable to getting OOB. Pt less congested this morning following suctioning from Respiratory Therapist. Rolling in bed with +2 for placement of MaxiSky sling. Unfortunately, MaxiSky had not been charged and the sling had to be swapped out for the [facility] sling. Pt required max verbal cues for directing care during preparation for/and during transfer. Pt transferred to TIS w/c with [facility]. Pt required +2 for repositioning in w/c after transfer. 5 ice chips provided per Water Protocol. Suction and sip&puff call bell positioned so pt can access. Pt able to activate call bell. Gait belt used to assist with positioning of BLE on leg rests. Continued education with wife. Pt remained in w/c with wife present.   Therapy Documentation Precautions:  Precautions Precautions: Cervical Precaution Booklet Issued: No Precaution Comments: C collar on when egde of bed, upright or OOB Required Braces or Orthoses: Cervical Brace Cervical Brace: Hard collar, Other (comment) (C collar when edge of bed, upright or OOB) Restrictions Weight Bearing Restrictions: No Pain:  Pt denies pain this morning   Therapy/Group: Individual Therapy  [name] 07/05/2020, 10:41 AM

## 2020-07-05 NOTE — Progress Notes (Signed)
No acute changes noted throughout shift, patient was dig stimulated twice doing shift with large soft brown bowel movements, incontinent care, and oral care provided..Foley patet and draining. Total assistance ,continue regime per orders.

## 2020-07-05 NOTE — Progress Notes (Signed)
Entered pt's room to do HS care and measure wounds. Patient refuses to be turned over on his side for wound care. Able to see from front that pt is clean and has not had a stool. TEDs removed.

## 2020-07-05 NOTE — Progress Notes (Signed)
Patient ID: Ryan Day, male   DOB: [birth year], 69 y.o.   MRN: [id number]  SW received updates from [facility] able to accept referral. SW met with pt wife to inform on above. Wife provided dates for Fam edu Sunday 9am-11am with her, dtr Ronny Bacon, and granddaughter Ardelia Mems, and Monday 9am-11am with her and dtr Tonya.  *SW received updates from [facility] unable to accept pt due to insurance (primary is [employer]). SW sent referral to Hel[facility] Kelp [facility]. SW waiting on follow-up. Referral declined. Sw waiting on follow-up from Amy Hyatt/[facility].   [name], MSW, [place] Office: [phone] Cell: [phone] Fax: [phone]

## 2020-07-05 NOTE — Progress Notes (Signed)
[facility] Progress Note   Subjective:   Patient seen and examined at bedside. He continues to complain of thirst, unchanged from the days prior. Na level down to 147 this morning, BUN 42, Cr 0.95. Overall fluid status improving. Urinating more with increased water flushes.   Denies CP, SOB, nausea, vomiting.  Objective Vitals:   07/05/20 0432 07/05/20 0440 07/05/20 0905 07/05/20 1131  BP: 111/62     Pulse: 74 73 79 70  Resp: 18 18 18 18   Temp: 98 F (36.7 C)     TempSrc: Axillary     SpO2: 94% 93% 93% 97%  Weight:      Height:       Physical Exam General:Older African-American male performing therapies in wheelchair and in no acute distress. Skin:Face appears greasy, lips and buccal mucosa are dry. Poor turgor noted in bilateral upper extremities with tenting. Head:NCAT, sclera not icteric. Neck: Supple. No lymphadenopathy. Tracheostomy collar in place. Lungs: CTA bilaterally, anteriorly. No wheeze or rales. Scattered rhonchi in upper airways. Breathing is unlabored. Heart:RRR. No murmur, rubs or gallops.  Abdomen: Soft, nontender, no guarding, no rebound tenderness. Abdominal binder in place. PEG tube noted in left upper quadrant. M/S: Quadriplegic. Flaccidity noted in bilateral upper and lower extremities. Lower extremities: Legs and feet wrapped with ace wraps. No apparent edema. Unable to appreciate wounds or skin integrity of LE. Neuro: AAOx3. Neuromuscular exam consistent with SCI. Psych: Responds to questions appropriately with a normal affect. Appropriate mood for time and situation.  Filed Weights   06/23/20 1730  Weight: 105.2 kg    Intake/Output Summary (Last 24 hours) at 07/05/2020 1225 Last data filed at 07/05/2020 9628 Gross per 24 hour  Intake 2874 ml  Output 2600 ml  Net 274 ml    Additional Objective Labs: Basic Metabolic Panel: Recent Labs  Lab 07/03/20 1913 07/04/20 0443 07/05/20 0956  NA 144 154* 147*  K 3.9 3.6 3.4*   CL 114* 114* 109  CO2 19* 31 27  GLUCOSE 173* 102* 239*  BUN 22 50* 42*  CREATININE 0.83 0.97 0.95  CALCIUM 9.0 9.8 9.3   CBC: Recent Labs  Lab 07/03/20 0611  WBC 10.3  HGB 10.2*  HCT 33.1*  MCV 94.6  PLT 314   CBG: Recent Labs  Lab 07/04/20 1949 07/04/20 2334 07/05/20 0427 07/05/20 0832 07/05/20 1126  GLUCAP 172* 189* 89 124* 223*   Medications:  . vitamin C  500 mg Per Tube BID  . bisacodyl  10 mg Rectal QHS  . chlorhexidine  15 mL Mouth Rinse BID  . Chlorhexidine Gluconate Cloth  6 each Topical BID  . collagenase   Topical Daily  . enoxaparin (LOVENOX) injection  40 mg Subcutaneous Q24H  . famotidine  20 mg Per Tube Daily  . feeding supplement (OSMOLITE 1.5 CAL)  474 mL Per Tube QID  . feeding supplement (PROSource TF)  45 mL Per Tube BID  . fludrocortisone  0.2 mg Per Tube Daily  . FLUoxetine  40 mg Per Tube Daily  . free water  400 mL Per Tube Q2H  . guaiFENesin  400 mg Per Tube Q6H  . insulin aspart  0-9 Units Subcutaneous Q4H  . lidocaine  1 patch Transdermal Q24H  . mouth rinse  15 mL Mouth Rinse q12n4p  . midodrine  5 mg Per Tube TID WC  . multivitamin  15 mL Per Tube Daily  . neomycin-bacitracin-polymyxin   Topical BID  . nutrition supplement (JUVEN)  1  packet Per Tube BID BM  . saccharomyces boulardii  250 mg Per Tube BID  . scopolamine  1 patch Transdermal Q72H  . sennosides  5 mL Per Tube Q0600  . traZODone  50 mg Per Tube QHS  . zinc sulfate  220 mg Per Tube Daily    Dialysis Orders:  Assessment/Plan: 1. Hypernatremia/Dehydration- in the setting of recent SCI and hypovolemia. Continue free water flushes to 458mL q2hrs for another 24 hours, then may decrease frequency and titrate to target Na level of 140mg /dL. Monitor BMP qAM. Once cleared to PO from ST standpoint, allow patient to have unlimited access to free water. This is not diabetes insipidus, it is inadequate access to free water and significant obligate solute losses related to high  protein intake. Will sign off at this time. We will remain available as needed.   2. Orthostatic Hypotension- Improving. Likely related to hypovolemia. On fludrocortisone and midodrine. Per CIR 3. Chronic anemia- Hgb stable at 10.2. Management per primary. 4. Neurogenic bladder and bowel - Maintain foley catheter and monitor strict I/Os. Bowel regimen as per primary. No sodium phos fleet enemas.  [facility] 07/05/2020,12:25 PM  LOS: 12 days

## 2020-07-05 NOTE — Progress Notes (Signed)
Speech Language Pathology Daily Session Note  Patient Details  Name: Ryan Day MRN: [id number] Date of Birth: 08-05-1951  Today's Date: 07/05/2020 SLP Individual Time: 1530-1600 SLP Individual Time Calculation (min): 30 min  Short Term Goals: Week 2: SLP Short Term Goal 1 (Week 2): Patient will perform pharyngeal swallow exercises with minA. SLP Short Term Goal 2 (Week 2): Patient will perform EMST 20-25 reps at 7-8 resistance. SLP Short Term Goal 3 (Week 2): Patient will maintain adequate vocal intensity at 10-12 word phrase/sentence level. SLP Short Term Goal 4 (Week 2): Patient will manage pharyngeal residuals during ice chip trials with throat clear, hard swallow to clear as evidenced by vocal quality returning to baseline.  Skilled Therapeutic Interventions:   Patient seen for skilled ST session focusing on swallow function. Patient now has call bell activated by blowing into straw type device which he is able to use. His voice appeared improved in overall intensity and quality and he was able to maintain adequate voicing to speak at phrase and sentence level with improved accuracy and duration. During ice chip trials, patient exhibited only 3 instances of mild throat clearing however this also appears improved since last time this SLP worked with him (last week). Patient and SLP to discuss plan for repeat MBS end of this week or next week. There had been plans to decannulate this week but he had nausea and vomiting and increased pharyngeal secretions and so this has been delayed. Patient continues to benefit from skilled SLP intervention to maximize voice and swallow function prior to discharge.   Pain Pain Assessment Pain Scale: 0-10 Pain Score: 0-No pain  Therapy/Group: Individual Therapy  Sonia Baller, MA, CCC-SLP Speech Therapy

## 2020-07-05 NOTE — Progress Notes (Signed)
Nutrition Follow-up  RD working remotely.  DOCUMENTATION CODES:   Not applicable  INTERVENTION:   - Please obtain updated weight  Continue bolus tube feeds via PEG: - 474 ml (2 cartons) Osmolite 1.5 cal formula QID - ProSource TF 45 ml BID - Per Nephrology, free water flushes of 400 ml q 2 hours (continue for another 24 hours then adjust for target sodium of 140)  Tube feeding regimen provides 2920 kcal, 142 grams of protein, and 1448 ml of H2O.   Total free water with current flushes: 6248 ml  NUTRITION DIAGNOSIS:   Increased nutrient needs related to wound healing as evidenced by estimated needs.  Ongoing  GOAL:   Patient will meet greater than or equal to 90% of their needs  Met via TF  MONITOR:   TF tolerance, Skin, Diet advancement, Labs  REASON FOR ASSESSMENT:   Consult Enteral/tube feeding initiation and management  ASSESSMENT:   69 yo male admitted with functional deficits due to SCI/quadriplegia. PMH includes recent admission to [facility] 8/4 s/p fall, striking his head, and resulting in quadriplegia. Hospitalization complicated by PTX, C diff colitis, PEA, respiratory distress, aspiration PNA, AKI. Also required trach and PEG. Discharged to [facility] 9/15. Transferred to [place] for intense rehab as recommended by his therapy team.  Noted target d/c date of 10/28.  Nephrology consulted for persistently elevated BUN and rising sodium. Free water flushes increased and both BUN and sodium now trending back down. Pt on strict I/O's. UOP has increased with increase in free water flushes.  Current TF regimen: 2 cartons Osmolite 1.5 QID, ProSource TF 45 ml BID, free water flushes of 400 ml q 2 hours  Medications reviewed and include: vitamin C 500 mg BID, dulcolax, pepcid, prozac, SSI q 4 hours, liquid MVI, Juven, florastor, senokot, zinc sulfate  Labs reviewed: sodium 147 (trending down), potassium 3.4, BUN 42 (trending down) CBG's: 89-223 x 24  hours  UOP: 2900 ml x 24 hours  Diet Order:   Diet Order            Diet NPO time specified  Diet effective now                 EDUCATION NEEDS:   Not appropriate for education at this time  Skin:  Skin Assessment: Skin Integrity Issues: Stage II: penis Stage III: bilateral buttocks Unstageable: coccyx  Last BM:  07/05/20 large type 6  Height:   Ht Readings from Last 1 Encounters:  06/23/20 '6\' 5"'  (1.956 m)    Weight:   Wt Readings from Last 1 Encounters:  06/23/20 105.2 kg    Ideal Body Weight:  94.5 kg  BMI:  Body mass index is 27.5 kg/m.  Estimated Nutritional Needs:   Kcal:  2600-2800  Protein:  145-165 gm  Fluid:  >/= 2.4 L    Gaynell Face, MS, RD, LDN Inpatient Clinical Dietitian Please see AMiON for contact information.

## 2020-07-05 NOTE — Consult Note (Signed)
Neuropsychological Consultation   Patient:   Ryan Day   DOB:   April 28, 1951  MR Number:  [id number]  Location:  [place] [address] [place] [id number] [place] [address] Dept: [place]: [phone]           Date of Service:   07/05/2020  Start Time:   1 PM End Time:   2 PM  Provider/Observer:  Ilean Skill, Psy.D.       Clinical Neuropsychologist       Billing Code/Service: [phone]  Chief Complaint:    Ryan Day is a 69 year old male with history of hypertension but otherwise in good health.  The patient was admitted to [facility] on 04/26/2020 after a fall when he struck his head and face on a piece of furniture and developed subsequent quadriplegia.  Patient was found to have C5 vertebral body extension type teardrop fracture, C3-C6 cord compression with edema and was taken to the OR for C3-C6 laminectomy with fusion and repair of small dural tear the same day.  Postop course was significant for spinal shock with hypotension and bradycardia, postprocedural PTX, C. difficile, significant secretions requiring multiple bronchoscopies and ultimately required PEG/trach on 05/04/2020.  Hospital course was further complicated by respiratory distress with PE and ROSC achieved after 8 minutes.  Aspiration pneumonia and intermittent fevers treated, AKI/acute on chronic anemia/abnormal LFTs resulting in he is tolerating PMS V trials.  Patient was discharged to [facility] for management of acute on chronic respiratory failure and for therapy.  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  Reason for Service:  Patient was referred for neuropsychological consultation due to coping and adjustment issues due to complete quadriplegia.  Below is the HPI for the current admission.  HPI: Ryan Day is a 69 year old male with history  of HTN otherwise in relatively good health who was admitted to [facility] on 04/26/2020 after fall, striking his head and face with subsequent qualdraplegia. He was found to have C5 vertebral body extension type teardrop fracture, C3-C6 cord compression with edema and was taken to OR for C3-C6 laminectomy with fusion and repair of small dural tear X 2 by Dr. Macario Carls on the same day. Post op course significant for spinal shock with hypotension and bradycardia, post procedural PTX,  C-diff colitis, significant secretions requiring multiple bronchoscopies due to mucous plugging and ultimately required PEG/Trach on 05/04/20. Hospital course further complicated by respiratory distress with PEA and and ROSC achieved after 8 minutes. Aspiration PNA and intermittent fevers treated, AKI/acute on chronic anemia/abnormal LFTs resolving and he was tolerating PMSV trials.   He was discharged to [facility] on 06/07/20 for management of acute on chronic respiratory failure and for therapy. He has had intermittent issues with pre-renal azotemia and hypernatremia--Na-149 today and started on IV dextrose. Secretions have improved with addition of scopolamine patch, he was downsized to CFS#6  and he is tolerating button plugging. He was found to have E-coli UTI at admission and treated with 7 day course of IV rocephin. CXR done 09/17 due to ongoing secretions and showed right infrahilar PNA which was treated with course of doxycycline. He was made NPO as FEES revealed penetration of nectar to vallecula with difficulty clearing residue and eventual aspiration. Multiple wound on sacrum have evolved and being managed with local measures.  Per RT- no desaturations or mucous plugging during his stay but  pulmonary recommends evaluation on outpatient basis prior to decannulation due to weakness.  Therapy has been ongoing with patient showing improvement in activity tolerance and therapy team was recommending intensive rehab program.  He has a supportive  family who plan on providing assistance after discharge. He was felt to be a good CIR candidate. Please see preadmission assessment from earlier today as well.   Current Status:  Upon entering the room, the patient was sitting elevated and was alert and oriented.  Speech was very slow and very low volume but was intelligible and he was able to show good expressive and receptive language and good orientation.  The patient acknowledged the degree of distress that he was had with sudden quadriplegia.  The patient's wife was also there and acknowledged her difficult stress responses with the current medical status of her husband and her need for ongoing care.  Mood responses to the situation were appropriate.  Patient denied severe depression but acknowledges considerable distress and the patient's wife is clearly having significant coping and adjustment implications.  Behavioral Observation: Ryan Day  presents as a 69 y.o.-year-old Right African American Male who appeared his stated age. his dress was Appropriate and he was Well Groomed and his manners were Appropriate to the situation.  his participation was indicative of Appropriate and Attentive behaviors.  There were any physical disabilities noted.  he displayed an appropriate level of cooperation and motivation.     Interactions:    Active Appropriate and Attentive  Attention:   within normal limits and attention span and concentration were age appropriate  Memory:   within normal limits; recent and remote memory intact  Visuo-spatial:  not examined  Speech (Volume):  low  Speech:   normal; slowed response  Thought Process:  Coherent and Relevant  Though Content:  WNL; not suicidal and not homicidal  Orientation:   person, place, time/date and situation  Judgment:   Good  Planning:   Fair  Affect:    Blunted and Lethargic  Mood:    Dysphoric  Insight:   Good  Intelligence:   normal  Medical History:   Past Medical History:   Diagnosis Date  . Acute on chronic respiratory failure with hypoxia ([place])   . Acute renal injury due to hypovolemia ([place])   . Autonomic instability   . Cardiac arrest ([place])   . Cervical spinal cord injury, sequela ([place])   . Elevated alkaline phosphatase level   . 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 Hyperlipidemia   . Hypertension    Psychiatric History:  No prior psychiatric history  Family Med/Psych History:  Family History  Problem Relation Age of Onset  . Cancer Father        lung  . Cancer Brother        throat  . Heart disease Brother 108  . Liver cancer Brother   . Alcohol abuse Brother      Impression/DX:  Ryan Day is a 69 year old male with history of hypertension but otherwise in good health.  The patient was admitted to [facility] on 04/26/2020 after a fall when he struck his head and face on a piece of furniture and developed subsequent quadriplegia.  Patient was found to have C5 vertebral body extension type teardrop fracture, C3-C6 cord compression with edema and was taken to the OR for C3-C6 laminectomy with fusion and repair of small dural tear the same day.  Postop course was significant for spinal shock with  hypotension and bradycardia, postprocedural PTX, C. difficile, significant secretions requiring multiple bronchoscopies and ultimately required PEG/trach on 05/04/2020.  Hospital course was further complicated by respiratory distress with PE and ROSC achieved after 8 minutes.  Aspiration pneumonia and intermittent fevers treated, AKI/acute on chronic anemia/abnormal LFTs resulting in he is tolerating PMS V trials.  Patient was discharged to [facility] for management of acute on chronic respiratory failure and for therapy.  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  Upon entering the room, the patient was sitting elevated and was alert and oriented.  Speech was  very slow and very low volume but was intelligible and he was able to show good expressive and receptive language and good orientation.  The patient acknowledged the degree of distress that he was had with sudden quadriplegia.  The patient's wife was also there and acknowledged her difficult stress responses with the current medical status of her husband and her need for ongoing care.  Mood responses to the situation were appropriate.  Patient denied severe depression but acknowledges considerable distress and the patient's wife is clearly having significant coping and adjustment implications.  Disposition/Plan:  Today we worked on coping and adjustment issues and had extensive conversation with the patient and his wife regarding current status and how to best manage with the significant change in functioning and overall life expectations.  I will follow up with the patient to continue to work on this extremely challenging and difficult situation for the patient and his family.  Diagnosis:    Follow-up exam - Plan: DG Abd Portable 1V, DG Abd Portable 1V         Electronically Signed   _______________________ Ilean Skill, Psy.D.

## 2020-07-05 NOTE — Progress Notes (Signed)
Physical Therapy Session Note  Patient Details  Name: Ryan Day MRN: [id number] Date of Birth: 1951/05/01  Today's Date: 07/05/2020 PT Individual Time: 0800-0900; 0272-5366 PT Individual Time Calculation (min): 60 min and 30 min  Short Term Goals: Week 2:  PT Short Term Goal 1 (Week 2): Pt will begin instructing caregivers/staff in assisting him with bed mobility and transfers PT Short Term Goal 2 (Week 2): Pt will recall pressure relief schedule of every 30 min x 2 min PT Short Term Goal 3 (Week 2): Pt will initiate power w/c mobility  Skilled Therapeutic Interventions/Progress Updates:    Session 1: Pt received seated in bed, agreeable to PT session. No complaints of pain. Semi-reclined BP 94/52. Assisted pt with donning thigh-high TEDs, BLE ACE wrap, and shorts dependently at bed level. Pt BP re-assessed while sitting upright in bed, 90/58. Assisted pt with donning abdominal binder. Pt frequently utilizes suction throughout session for secretion management due to increased congestion this date. MD aware and RT notified that pt requiring his trach be suctioned this AM. Pt left seated in bed with needs in reach, wife present at end of session.  Session 2: Pt received seated in bed, agreeable to PT session. No complaints of pain. Pt positioned in supine then is assist x 2 to scoot towards HOB. Lengthened foot board for improved pt fit in bed. Session focus on BLE PROM in available planes of motion to prevent contracture. Discussed benefits of PROM to prevent contracture and for improved positioning. Handout provided and will review with wife when present during session. Pt left seated in bed with needs in reach at end of session.  Therapy Documentation Precautions:  Precautions Precautions: Cervical Precaution Booklet Issued: No Precaution Comments: C collar on when egde of bed, upright or OOB Required Braces or Orthoses: Cervical Brace Cervical Brace: Hard collar, Other (comment) (C  collar when edge of bed, upright or OOB) Restrictions Weight Bearing Restrictions: No   Therapy/Group: Individual Therapy   Excell Seltzer, PT, DPT  07/05/2020, 12:26 PM

## 2020-07-06 ENCOUNTER — Inpatient Hospital Stay (HOSPITAL_COMMUNITY): Payer: BC Managed Care – PPO

## 2020-07-06 ENCOUNTER — Inpatient Hospital Stay (HOSPITAL_COMMUNITY): Payer: BC Managed Care – PPO | Admitting: Occupational Therapy

## 2020-07-06 ENCOUNTER — Inpatient Hospital Stay (HOSPITAL_COMMUNITY): Payer: BC Managed Care – PPO | Admitting: Speech Pathology

## 2020-07-06 DIAGNOSIS — S14109S Unspecified injury at unspecified level of cervical spinal cord, sequela: Secondary | ICD-10-CM | POA: Diagnosis not present

## 2020-07-06 LAB — GLUCOSE, CAPILLARY
Glucose-Capillary: 101 mg/dL — ABNORMAL HIGH (ref 70–99)
Glucose-Capillary: 168 mg/dL — ABNORMAL HIGH (ref 70–99)
Glucose-Capillary: 223 mg/dL — ABNORMAL HIGH (ref 70–99)
Glucose-Capillary: 97 mg/dL (ref 70–99)

## 2020-07-06 MED ORDER — INSULIN ASPART 100 UNIT/ML ~~LOC~~ SOLN
0.0000 [IU] | Freq: Two times a day (BID) | SUBCUTANEOUS | Status: DC
  Administered 2020-07-06: 3 [IU] via SUBCUTANEOUS
  Administered 2020-07-08: 2 [IU] via SUBCUTANEOUS
  Administered 2020-07-09: 3 [IU] via SUBCUTANEOUS
  Administered 2020-07-10 – 2020-07-11 (×2): 1 [IU] via SUBCUTANEOUS
  Administered 2020-07-13: 3 [IU] via SUBCUTANEOUS
  Administered 2020-07-14: 2 [IU] via SUBCUTANEOUS
  Administered 2020-07-15: 3 [IU] via SUBCUTANEOUS
  Administered 2020-07-18: 1 [IU] via SUBCUTANEOUS

## 2020-07-06 MED ORDER — POTASSIUM CHLORIDE 20 MEQ/15ML (10%) PO SOLN
40.0000 meq | Freq: Once | ORAL | Status: AC
  Administered 2020-07-06: 40 meq
  Filled 2020-07-06: qty 30

## 2020-07-06 NOTE — Progress Notes (Signed)
Occupational Therapy Session Note  Patient Details  Name: Ryan Day MRN: [id number] Date of Birth: 1951-01-26  Today's Date: 07/06/2020 OT Individual Time: 0930-1040 OT Individual Time Calculation (min): 70 min    Short Term Goals: Week 3:  OT Short Term Goal 1 (Week 3): Pt will roll B in bed with MOD +2 A OT Short Term Goal 2 (Week 3): Pt will direct functional transfers with max VC OT Short Term Goal 3 (Week 3): Pt will maintain static sitting balnace wiht MAX A of 1 caregiver for 3 min at EOB/EOM  Skilled Therapeutic Interventions/Progress Updates:    OT intervention with focus on bed mobility, directing care, and sitting tolerance. Pt continues to require max/total questioning cues for directing care in preparation for transfer to w/c via [employer]. Static sitting balance EOB with tot A+2. Tot A+2 for rolling in bed to don pants and place sling in preparation for transfer. Suction and puff call bell placed where pt can access. Pt demonstrated use of puff call bell. Pt requested ice chips per protocol and received total of 5 ice ships. Pt remained in TIS w/c with BUE positioned with pillows for safety and comfots. BP supine with ted hose, ace wraps, and abdominal binder-132/70; sitting in TIS w/c semireclined-97/57. RN notified of pt's status.  Therapy Documentation Precautions:  Precautions Precautions: Cervical Precaution Booklet Issued: No Precaution Comments: C collar on when egde of bed, upright or OOB Required Braces or Orthoses: Cervical Brace Cervical Brace: Hard collar, Other (comment) (C collar when edge of bed, upright or OOB) Restrictions Weight Bearing Restrictions: No Pain:  Pt denies pain this morning   Therapy/Group: Individual Therapy  [name] 07/06/2020, 10:46 AM

## 2020-07-06 NOTE — Progress Notes (Signed)
Speech Language Pathology Daily Session Note  Patient Details  Name: Ryan Day MRN: [id number] Date of Birth: May 24, 1951  Today's Date: 07/06/2020 SLP Individual Time: 8828-0034 SLP Individual Time Calculation (min): 45 min  Short Term Goals: Week 2: SLP Short Term Goal 1 (Week 2): Patient will perform pharyngeal swallow exercises with minA. SLP Short Term Goal 2 (Week 2): Patient will perform EMST 20-25 reps at 7-8 resistance. SLP Short Term Goal 3 (Week 2): Patient will maintain adequate vocal intensity at 10-12 word phrase/sentence level. SLP Short Term Goal 4 (Week 2): Patient will manage pharyngeal residuals during ice chip trials with throat clear, hard swallow to clear as evidenced by vocal quality returning to baseline.  Skilled Therapeutic Interventions:   Patient for skilled ST session focusing on speech/voice and swallow function goals. Patient consumed ice chips (approximately 5 ounces) with minimal frequency of throat clearing and one instance of patient coughing and expectorating (via suction from mouth) small amount of secretions. Patient's voice was a little stronger today but continues to be hoarse and low vocal intensity. OT had set up his phone on stand on his chair so it is positioned in front of his face. He and SLP were able to setup voice commands so he could call his wife using Teacher, music. Patient was able to achieve adequate vocal intensity to perform this. SLP informed patient that feels best to plan for repeat MBS next week as patient could benefit from a little more time; he is in agreement with this. Patient continues to benefit from skilled SLP intervention to maximize swallow, speech/voice function prior to discharge.  Pain Pain Assessment Pain Scale: 0-10 Pain Score: 0-No pain  Therapy/Group: Individual Therapy  Ryan Baller, MA, CCC-SLP Speech Therapy

## 2020-07-06 NOTE — Progress Notes (Signed)
Occupational Therapy Weekly Progress Note  Patient Details  Name: Ryan Day MRN: [id number] Date of Birth: 02/25/1951  Beginning of progress report period: June 30, 2020 End of progress report period: July 06, 2020  Patient has met 0 of 3 short term goals.  Pt progress has been slow this past week.  Pt continues to require tot A+2 for rolling in bed and supine<>sit. Pt requires +2 for sitting balance EOB. Pt is independent with directing care regarding placement of suction and puff call bell, in addition to requesting ice chips. Pt progress with directing care regarding transfers has been very slow.  [name] has been present to observe therapy and education has been initiated and hands on education/training will begin next week.  Patient continues to demonstrate the following deficits:muscle weakness, decreased cardiorespiratoy endurance, impaired timing and sequencing, abnormal tone and unbalanced muscle activation and decreased sitting balance and decreased balance strategies and therefore will continue to benefit from skilled OT intervention to enhance overall performance with BADL and Reduce care partner burden.  Patient progressing toward long term goals.  Continue plan of care.  OT Short Term Goals Week 2:  OT Short Term Goal 1 (Week 2): Pt will maintain static sitting balnace wiht MAX A of 1 caregiver for 3 min at EOB/EOM OT Short Term Goal 1 - Progress (Week 2): Progressing toward goal OT Short Term Goal 2 (Week 2): Pt will direct functional transfers with max VC OT Short Term Goal 2 - Progress (Week 2): Progressing toward goal OT Short Term Goal 3 (Week 2): Pt will roll B in bed with MOD +2 A OT Short Term Goal 3 - Progress (Week 2): Progressing toward goal Week 3:  OT Short Term Goal 1 (Week 3): Pt will roll B in bed with MOD +2 A OT Short Term Goal 2 (Week 3): Pt will direct functional transfers with max VC OT Short Term Goal 3 (Week 3): Pt will maintain static sitting  balnace wiht MAX A of 1 caregiver for 3 min at EOB/EOM   [name] 07/06/2020, 6:53 AM

## 2020-07-06 NOTE — Progress Notes (Signed)
Physical Therapy Session Note  Patient Details  Name: Nova Evett MRN: [id number] Date of Birth: 04-Sep-1951  Today's Date: 07/06/2020 PT Individual Time: 2162-4469 PT Individual Time Calculation (min): 45 min   Short Term Goals: Week 1:  PT Short Term Goal 1 (Week 1): Pt will begin to instruct caregiver/staff in bed mobility and transfers. PT Short Term Goal 1 - Progress (Week 1): Progressing toward goal PT Short Term Goal 2 (Week 1): Pt will increase sitting tolerance on edge of bed to 30 minutes assist with ADL/mobility PT Short Term Goal 2 - Progress (Week 1): Progressing toward goal PT Short Term Goal 3 (Week 1): Pt will increase sitting balance on edge of bed to mod to max A to assist with ADLs/mobility PT Short Term Goal 3 - Progress (Week 1): Progressing toward goal PT Short Term Goal 4 (Week 1): Pt will propel power w/c on level surfaces with S and verbal cues. PT Short Term Goal 4 - Progress (Week 1): Other (comment) (working towards obtaining proper equipment) Week 2:  PT Short Term Goal 1 (Week 2): Pt will begin instructing caregivers/staff in assisting him with bed mobility and transfers PT Short Term Goal 2 (Week 2): Pt will recall pressure relief schedule of every 30 min x 2 min PT Short Term Goal 3 (Week 2): Pt will initiate power w/c mobility  Skilled Therapeutic Interventions/Progress Updates:    pt received in bed and agreeable to therapy. Pt directed in rolling x3 to R and L abdominal binder placement total Ax2, per MD pt needs to have abdominal binder cut for small opening for PEG tube, this was completed with nursing present to assist while pt in supine. Pt directed in donning gown in supine, dependent. PT lead pt in donning abdominal binder, B TED hose, ace wrapping BLE, dependently in supine. Pt then agreeable to sitting upright in bed, with use of hospital bed functions. Pt educated on skin integrity, pressure relief schedule and directed to sign in pt's room to remind  him when in chair. Pt  Left in bed, all needs met, All needs in reach and in good condition. Call light in hand.    Therapy Documentation Precautions:  Precautions Precautions: Cervical Precaution Booklet Issued: No Precaution Comments: C collar on when egde of bed, upright or OOB Required Braces or Orthoses: Cervical Brace Cervical Brace: Hard collar, Other (comment) (C collar when edge of bed, upright or OOB) Restrictions Weight Bearing Restrictions: No General:   Vital Signs: Therapy Vitals Pulse Rate: 65 Resp: 18 Patient Position (if appropriate): Lying Oxygen Therapy SpO2: 98 % O2 Device: Room Air    Therapy/Group: Individual Therapy  Junie Panning 07/06/2020, 11:08 AM

## 2020-07-06 NOTE — Progress Notes (Signed)
Occupational Therapy Session Note  Patient Details  Name: Ryan Day MRN: [id number] Date of Birth: August 14, 1951  Today's Date: 07/06/2020 OT Individual Time: 1115-1200 OT Individual Time Calculation (min): 45 min    Short Term Goals: Week 1:  OT Short Term Goal 1 (Week 1): Pt will maintain static sitting balnace wiht MAX A of 1 caregiver for 3 min at EOB/EOM OT Short Term Goal 1 - Progress (Week 1): (P) Progressing toward goal OT Short Term Goal 2 (Week 1): Pt will direct functional transfers with max VC OT Short Term Goal 2 - Progress (Week 1): (P) Progressing toward goal OT Short Term Goal 3 (Week 1): (P) Pt will roll B in bed with MOD +2 A OT Short Term Goal 3 - Progress (Week 1): (P) Progressing toward goal  Skilled Therapeutic Interventions/Progress Updates:    1;1. Pt received in TIS agreeable to OT. Pt and OT work on access to cell phone via voice, setting up Teacher, music and using simulated mouth stick for access to screen. Edu re pt and wife (pt able to place phone call to wife with OK google features and MOD cuing to use ok google command). OT will continue to explore features of accessibility to improve access to answering phone calls automatically and with speaker already on. Pt trials make shift mouth stick and pt able to tap 4 corners of screen. Exited session with pt seated in TIS and RN in room.    Therapy Documentation Precautions:  Precautions Precautions: Cervical Precaution Booklet Issued: No Precaution Comments: C collar on when egde of bed, upright or OOB Required Braces or Orthoses: Cervical Brace Cervical Brace: Hard collar, Other (comment) (C collar when edge of bed, upright or OOB) Restrictions Weight Bearing Restrictions: No General:   Vital Signs: Therapy Vitals Pulse Rate: 72 Resp: 16 Patient Position (if appropriate): Lying Oxygen Therapy SpO2: 97 % O2 Device: Room Air Pain:   ADL: ADL Eating: NPO Grooming: Dependent Upper Body Bathing:  Dependent Lower Body Bathing: Dependent Upper Body Dressing: Dependent Where Assessed-Upper Body Dressing: Bed level Lower Body Dressing: Dependent Where Assessed-Lower Body Dressing: Bed level Toileting: Dependent Where Assessed-Toileting: Bed level Vision   Perception    Praxis   Exercises:   Other Treatments:     Therapy/Group: Individual Therapy  Tonny Branch 07/06/2020, 12:16 PM

## 2020-07-06 NOTE — Plan of Care (Signed)
  Problem: SCI BOWEL ELIMINATION Goal: RH STG MANAGE BOWEL WITH ASSISTANCE Description: STG Manage Bowel with mod to max Assistance. Outcome: Not Progressing; bowel program   Problem: SCI BLADDER ELIMINATION Goal: RH STG MANAGE BLADDER WITH EQUIPMENT WITH ASSISTANCE Description: STG Manage Bladder With Equipment With mod to max Assistance Outcome: Not Progressing; foley cath

## 2020-07-06 NOTE — Plan of Care (Signed)
  Problem: RH SKIN INTEGRITY Goal: RH STG SKIN FREE OF INFECTION/BREAKDOWN Description: mod to max assist 07/06/2020 0946 by Ander Slade, RN Outcome: Progressing; penile laceration due to catheter; placed gauze to prevent further laceration; changed placement of securing device

## 2020-07-06 NOTE — Progress Notes (Addendum)
Patient ID: Haygen Zebrowski, male   DOB: 10-15-1950, 69 y.o.   MRN: [id number]  Amy Hyatt/[facility] declined referral due to pt insurance policy is out of network. SW sent referral to Britney/[facility]. SW waiting on follow-up.  *Britney reports [employer] is currently on hold and to follow-up next week to see if any options. SW received updates from Cheryl/[facility] reporting pt insurance has co-pay due to being out of network. SW sent referral to Drew/[facility]. SW waiting on follow-up.    Declined HHA: [facility]-insurance [facility] [employer] and care needs [facility]- insurance/out of network [facility]-not in network; 20-30% co pay  [name], MSW, [place] Office: [phone] Cell: [phone] Fax: [phone]

## 2020-07-06 NOTE — Progress Notes (Signed)
[place] PHYSICAL MEDICINE & REHABILITATION PROGRESS NOTE   Subjective/Complaints:   Pt reports doing OK- having some spasms of LEs, but they "just move"- not painful- not annoying.   Sats 98%- sounds better this AM.   Will decrease BG checks to BID and make sure PT has hold in abd binder for PEG site.   ROS:   Pt denies SOB, abd pain, CP, N/V/C/D, and vision changes  Objective:   No results found. No results for input(s): WBC, HGB, HCT, PLT in the last 72 hours. Recent Labs    07/04/20 0443 07/05/20 0956  NA 154* 147*  K 3.6 3.4*  CL 114* 109  CO2 31 27  GLUCOSE 102* 239*  BUN 50* 42*  CREATININE 0.97 0.95  CALCIUM 9.8 9.3    Intake/Output Summary (Last 24 hours) at 07/06/2020 0913 Last data filed at 07/06/2020 2694 Gross per 24 hour  Intake 474 ml  Output 2654 ml  Net -2180 ml     Pressure Injury 06/23/20 Coccyx Medial;Left Unstageable - Full thickness tissue loss in which the base of the injury is covered by slough (yellow, tan, gray, green or brown) and/or eschar (tan, brown or black) in the wound bed. upper left sacrum is [name] (Active)  06/23/20 1710  Location: Coccyx  Location Orientation: Medial;Left  Staging: Unstageable - Full thickness tissue loss in which the base of the injury is covered by slough (yellow, tan, gray, green or brown) and/or eschar (tan, brown or black) in the wound bed.  Wound Description (Comments): upper left sacrum is unstageable and has a leathery covering over the wound bed  Present on Admission: Yes     Pressure Injury 06/23/20 Buttocks Right Stage 3 -  Full thickness tissue loss. Subcutaneous fat may be visible but bone, tendon or muscle are NOT exposed. healing stage 3 (Active)  06/23/20 1838  Location: Buttocks  Location Orientation: Right  Staging: Stage 3 -  Full thickness tissue loss. Subcutaneous fat may be visible but bone, tendon or muscle are NOT exposed.  Wound Description (Comments): healing stage 3  Present on  Admission: Yes     Pressure Injury 06/23/20 Left Stage 3 -  Full thickness tissue loss. Subcutaneous fat may be visible but bone, tendon or muscle are NOT exposed. healing stage 3 (Active)  06/23/20 1839  Location:   Location Orientation: Left  Staging: Stage 3 -  Full thickness tissue loss. Subcutaneous fat may be visible but bone, tendon or muscle are NOT exposed.  Wound Description (Comments): healing stage 3  Present on Admission: Yes     Pressure Injury 06/30/20 Penis Medial;Posterior Stage 2 -  Partial thickness loss of dermis presenting as a shallow open injury with a red, pink wound bed without slough. catheter sized indention and opening of skin  of urethral meatus on posterior side  (Active)  06/30/20 1845  Location: Penis  Location Orientation: Medial;Posterior  Staging: Stage 2 -  Partial thickness loss of dermis presenting as a shallow open injury with a red, pink wound bed without slough.  Wound Description (Comments): catheter sized indention and opening of skin  of urethral meatus on posterior side of the urethral meatus  Present on Admission:     Physical Exam: Vital Signs Blood pressure 119/77, pulse 65, temperature 97.7 F (36.5 C), resp. rate 18, height 6\' 5"  (1.956 m), weight 105.2 kg, SpO2 98 %. . General: sitting up- has sip and puff in place, appropriate, flat affect, NAD HEENT: trach capped still  sats 98% Heart: RRR Chest: decreased at bases, but adequate air movement- a little coarse today, but better than last 2 days Abdomen: Soft, NT, ND, (+)BS  ; PEG- getting abd binder put on Extremities: No clubbing, cyanosis, or edema. Pulses are 2+   Skin: Stage II/deep or superficial Stage III- more slough is resolved (not all of it)- bright pink/granulating overall-     Ox3- quiet Motor: B/l UE shoulder abduction 1+/5, distally 0/5 with ?trace flicker left wrist flexor B/l LE: 0/5 proximal to distal Sensation diminished to light touch in all  extremities Psych: quiet/flat affect- less interactive today     Assessment/Plan: 1. 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  Physiatrist is providing close team supervision and 24 hour management of active medical problems listed below.  Physiatrist and rehab team continue to assess barriers to discharge/monitor patient progress toward functional and medical goals  Care Tool:  Bathing              Bathing assist Assist Level: 2 Helpers     Upper Body Dressing/Undressing Upper body dressing   What is the patient wearing?: Pull over shirt    Upper body assist Assist Level: 2 Helpers    Lower Body Dressing/Undressing Lower body dressing      What is the patient wearing?: Incontinence brief     Lower body assist Assist for lower body dressing: 2 Helpers     Toileting Toileting    Toileting assist Assist for toileting: Dependent - Patient 0%     Transfers Chair/bed transfer  Transfers assist  Chair/bed transfer activity did not occur: Safety/medical concerns  Chair/bed transfer assist level: Dependent - mechanical lift     Locomotion Ambulation   Ambulation assist   Ambulation activity did not occur: Safety/medical concerns          Walk 10 feet activity   Assist           Walk 50 feet activity   Assist           Walk 150 feet activity   Assist           Walk 10 feet on uneven surface  activity   Assist           Wheelchair     Assist Will patient use wheelchair at discharge?: Yes Type of Wheelchair: Power Wheelchair activity did not occur: Safety/medical concerns         Wheelchair 50 feet with 2 turns activity    Assist    Wheelchair 50 feet with 2 turns activity did not occur: Safety/medical concerns       Wheelchair 150 feet activity     Assist  Wheelchair 150 feet activity  did not occur: Safety/medical concerns       Blood pressure 119/77, pulse 65, temperature 97.7 F (36.5 C), resp. rate 18, height 6\' 5"  (1.956 m), weight 105.2 kg, SpO2 98 %.    Medical Problem List and Plan: 1.  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  Antithrombotics: -DVT/anticoagulation:  Pharmaceutical: Lovenox--check dopplers in am.  10/5- Dopplers (-)  10/10- needs for 3 months  from starting it             -antiplatelet therapy: N/A 3. Pain Management: N/A 4. Mood: LCSW to follow for evaluation and support.              -antipsychotic agents: N/A  10/10- will ask Neuropsych to see pt- when wife there, if possible.   10/12- emailed SW to put on schedule  10/14- saw Neuropsych yesterday 5. Neuropsych: This patient is capable of making decisions on his own behalf. 6. Sacral decub/Skin/Wound Care: Pictures show wound in different stages of healing. Ordered Air mattress overlay. Continue vitamin and prostat to promote healing. Will add Juven also for collagen. Will increase tube feed to 50 cc/hr as likely has higher nutritional need. Will consult dietician to help address nutritional needs.   10/4- will consult dietician- BUN up to 54 from 27 and Cr 1.19 from <1- asked to evaluate dryness  10/5- haven't seen a dietician note- will check on this.  10/9- less slough, so will ask WOC to see if can change to silver alginate, or something like that.  Also ordered neosporin for new wound around Urethral meatus  10/10- waiting for wound care to see- since weekend.   10/11: appreciate WOC eval and recs 7. Fluids/Electrolytes/Nutrition: Monitor I/Os.CMP ordered. Transition to bolus tube feeds next week if stable.   10/5- very dry- have placed Dietician order- need to help pt with BUN up to 54  10/7- Na 151- will ask Nutrition to see today and reassess 10/8-  changed to increase water flushes 250cc/q4 hours- Labs Sunday  10/9- labs in AM  10/10- Na 154; Cr 0.94 and BUN 58- very dry!- spoke with dietician on call- will increase free water flushes and get off Nepro- not helping.   10/12- Na down to 154 from 156- nephrology has put on 400cc q2 hours- nephrology following  10/14- Na down to 147 in last 24 hours- will recheck in AM- appreciate Nephrology help 8. Orthostatic Hypotension: Secondary to autonomic dysfunction has resolved.              Monitor with increased activity, particularly for orthostasis.   10/3 well controlled   10/4- will get abd binder- cut a hole for PEG  10/5- will add Midodrine 5 mg TID with meals- due to BP 95M/ systolic  84/1- will add Florinef 0.1 mg daily- and titrate up as required  10/8- BP better today- less dizzy  10/11: BP soft today- abdominal binder and TEDs with therapy  10/12- BP soft still, at 30 degrees- will increase florinef to 0.2 mg daily.   10/14- BP 119/77- less dizziness per staff 9. Acute on chronic anemia: Improving--continue to monitor. Added iron supplement.              Hgb stable 10/11 10. Hypernatremia/Hyperglycemia: Due to intermittent IV dextrose. Added water flushes. May need low sodium formula.   10/4- Na 144- doing OK  10/9- Na 151- rechecking in AM  10/11: 156- nephro consulted 11. Neurogenic bladder: Maintain foley due to sacral wounds.. 12. Neurogenic bowel:Continue senna in am with suppository in evenings for bowel program. Moving bowels regularly   10/1-0 having small BM last night with program 13. Secretions/Trach/capped: continue scopolamine patch  10/5- will teach staff about quad coughing-   10/12- cannot remove trach at this time due to trach suctioning pt requires  10/14- con't trach due to needing suctioning 14. Azotemia/NPO- getting TFs  10/4- consulted dietician  10/5- don't see note  from nutrition- will check on this.   10/7- BUN down to 37 from 54- and Cr <1 now- doing  better  10/8- Bun up to 46- called nutrition- will con't Nepro but try to recheck Labs Sunday  10/9- labs in AM  10/10- as above- dietician increasing free water- IF no improvement, needs Nephrology consult. Also ordered strict I/o's.  10/12- Nephrology on board- Na back down to 154- BUN down to 50  10/14- BUN down to 42 and Na down to 147-   15. W/C requirement- will need either head array OR chin control power w/c- have already discussed with w/c rep/company- they are working on getting a demo for him.   16. Hypokalemia  10/14- will give repletion of KCL today 40 mEq x1.      LOS: 13 days A FACE TO FACE EVALUATION WAS PERFORMED  Chelle Cayton 07/06/2020, 9:13 AM

## 2020-07-07 ENCOUNTER — Inpatient Hospital Stay (HOSPITAL_COMMUNITY): Payer: BC Managed Care – PPO | Admitting: Speech Pathology

## 2020-07-07 ENCOUNTER — Inpatient Hospital Stay (HOSPITAL_COMMUNITY): Payer: BC Managed Care – PPO

## 2020-07-07 ENCOUNTER — Inpatient Hospital Stay (HOSPITAL_COMMUNITY): Payer: BC Managed Care – PPO | Admitting: Physical Therapy

## 2020-07-07 DIAGNOSIS — S14109S Unspecified injury at unspecified level of cervical spinal cord, sequela: Secondary | ICD-10-CM | POA: Diagnosis not present

## 2020-07-07 LAB — GLUCOSE, CAPILLARY
Glucose-Capillary: 107 mg/dL — ABNORMAL HIGH (ref 70–99)
Glucose-Capillary: 197 mg/dL — ABNORMAL HIGH (ref 70–99)
Glucose-Capillary: 154 mg/dL — ABNORMAL HIGH (ref 70–99)

## 2020-07-07 LAB — BASIC METABOLIC PANEL
Anion gap: 10 (ref 5–15)
BUN: 36 mg/dL — ABNORMAL HIGH (ref 8–23)
CO2: 28 mmol/L (ref 22–32)
Calcium: 9.3 mg/dL (ref 8.9–10.3)
Chloride: 102 mmol/L (ref 98–111)
Creatinine, Ser: 0.72 mg/dL (ref 0.61–1.24)
GFR, Estimated: >60 mL/min (ref >60)
Glucose, Bld: 181 mg/dL — ABNORMAL HIGH (ref 70–99)
Potassium: 3.8 mmol/L (ref 3.5–5.1)
Sodium: 140 mmol/L (ref 135–145)

## 2020-07-07 MED ORDER — BACLOFEN 5 MG HALF TABLET
5.0000 mg | ORAL_TABLET | Freq: Three times a day (TID) | ORAL | Status: DC
  Administered 2020-07-07 – 2020-07-10 (×9): 5 mg via ORAL
  Filled 2020-07-07 (×9): qty 1

## 2020-07-07 MED ORDER — MIDODRINE HCL 5 MG PO TABS
5.0000 mg | ORAL_TABLET | ORAL | Status: AC
  Administered 2020-07-07: 5 mg via ORAL
  Filled 2020-07-07: qty 1

## 2020-07-07 MED ORDER — ONDANSETRON HCL 4 MG PO TABS: 4.0000 mg | ORAL_TABLET | Freq: Three times a day (TID) | ORAL | Status: DC | PRN

## 2020-07-07 MED ORDER — MIDODRINE HCL 5 MG PO TABS
10.0000 mg | ORAL_TABLET | Freq: Three times a day (TID) | ORAL | Status: DC
  Administered 2020-07-07 – 2020-07-20 (×38): 10 mg
  Filled 2020-07-07 (×39): qty 2

## 2020-07-07 NOTE — Progress Notes (Addendum)
[place] PHYSICAL MEDICINE & REHABILITATION PROGRESS NOTE   Subjective/Complaints:   Abd binder was thrown away yesterday because had stool on it- but didn't order another one - OT working on this right now, because laying down pt's BP 80s/50s and cannot sit him up safely without it-- must be abd binder that's the issue, because laying down yesterday BP was 130s/80s and no dizziness during therapy per pt.   Had BM with bowel program last night.  Feels spasms are annoying enough, he wants something to help spasms/treat them- will start Baclofen. 5 mg TID.    Labs pending ROS:   Pt denies SOB, abd pain, CP, N/V/C/D, and vision changes   Objective:   No results found. No results for input(s): WBC, HGB, HCT, PLT in the last 72 hours. Recent Labs    07/05/20 0956  NA 147*  K 3.4*  CL 109  CO2 27  GLUCOSE 239*  BUN 42*  CREATININE 0.95  CALCIUM 9.3    Intake/Output Summary (Last 24 hours) at 07/07/2020 1610 Last data filed at 07/07/2020 0100 Gross per 24 hour  Intake 474 ml  Output 2100 ml  Net -1626 ml     Pressure Injury 06/23/20 Coccyx Medial;Left Unstageable - Full thickness tissue loss in which the base of the injury is covered by slough (yellow, tan, gray, green or brown) and/or eschar (tan, brown or black) in the wound bed. upper left sacrum is [name] (Active)  06/23/20 1710  Location: Coccyx  Location Orientation: Medial;Left  Staging: Unstageable - Full thickness tissue loss in which the base of the injury is covered by slough (yellow, tan, gray, green or brown) and/or eschar (tan, brown or black) in the wound bed.  Wound Description (Comments): upper left sacrum is unstageable and has a leathery covering over the wound bed  Present on Admission: Yes     Pressure Injury 06/23/20 Buttocks Right Stage 3 -  Full thickness tissue loss. Subcutaneous fat may be visible but bone, tendon or muscle are NOT exposed. healing stage 3 (Active)  06/23/20 1838  Location:  Buttocks  Location Orientation: Right  Staging: Stage 3 -  Full thickness tissue loss. Subcutaneous fat may be visible but bone, tendon or muscle are NOT exposed.  Wound Description (Comments): healing stage 3  Present on Admission: Yes     Pressure Injury 06/23/20 Left Stage 3 -  Full thickness tissue loss. Subcutaneous fat may be visible but bone, tendon or muscle are NOT exposed. healing stage 3 (Active)  06/23/20 1839  Location:   Location Orientation: Left  Staging: Stage 3 -  Full thickness tissue loss. Subcutaneous fat may be visible but bone, tendon or muscle are NOT exposed.  Wound Description (Comments): healing stage 3  Present on Admission: Yes     Pressure Injury 06/30/20 Penis Medial;Posterior Stage 2 -  Partial thickness loss of dermis presenting as a shallow open injury with a red, pink wound bed without slough. catheter sized indention and opening of skin  of urethral meatus on posterior side  (Active)  06/30/20 1845  Location: Penis  Location Orientation: Medial;Posterior  Staging: Stage 2 -  Partial thickness loss of dermis presenting as a shallow open injury with a red, pink wound bed without slough.  Wound Description (Comments): catheter sized indention and opening of skin  of urethral meatus on posterior side of the urethral meatus  Present on Admission:     Physical Exam: Vital Signs Blood pressure 133/65, pulse 64, temperature 98.3  F (36.8 C), temperature source Oral, resp. rate 18, height 6\' 5"  (1.956 m), weight 105.2 kg, SpO2 97 %. . General: laying back- sip and puff too far away from pt, appropriate, flat/quiet, NAD HEENT: trach capped still- sats 98% this AM,  Heart: RRR Chest: much more coarse this AM- more upper airway sounds, however also a little wheezy- needs suctioning- pt doesn't want? Abdomen: Soft, NT, ND, (+)BS  ; PEG- in place Extremities: No clubbing, cyanosis, or edema. Pulses are 2+  Skin: Stage II/deep or superficial Stage III- more  slough is resolved (not all of it)- bright pink/granulating overall-     Ox3- quiet Motor: B/l UE shoulder abduction 1+/5, distally 0/5 with ?trace flicker left wrist flexor B/l LE: 0/5 proximal to distal Sensation diminished to light touch in all extremities Psych: less interactive today, quiet-      Assessment/Plan: 1. 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  Physiatrist is providing close team supervision and 24 hour management of active medical problems listed below.  Physiatrist and rehab team continue to assess barriers to discharge/monitor patient progress toward functional and medical goals  Care Tool:  Bathing              Bathing assist Assist Level: 2 Helpers     Upper Body Dressing/Undressing Upper body dressing   What is the patient wearing?: Pull over shirt    Upper body assist Assist Level: 2 Helpers    Lower Body Dressing/Undressing Lower body dressing      What is the patient wearing?: Incontinence brief     Lower body assist Assist for lower body dressing: 2 Helpers     Toileting Toileting    Toileting assist Assist for toileting: Dependent - Patient 0%     Transfers Chair/bed transfer  Transfers assist  Chair/bed transfer activity did not occur: Safety/medical concerns  Chair/bed transfer assist level: Dependent - mechanical lift     Locomotion Ambulation   Ambulation assist   Ambulation activity did not occur: Safety/medical concerns          Walk 10 feet activity   Assist           Walk 50 feet activity   Assist           Walk 150 feet activity   Assist           Walk 10 feet on uneven surface  activity   Assist           Wheelchair     Assist Will patient use wheelchair at discharge?: Yes Type of Wheelchair: Power Wheelchair activity did not occur: Safety/medical  concerns         Wheelchair 50 feet with 2 turns activity    Assist    Wheelchair 50 feet with 2 turns activity did not occur: Safety/medical concerns       Wheelchair 150 feet activity     Assist  Wheelchair 150 feet activity did not occur: Safety/medical concerns       Blood pressure 133/65, pulse 64, temperature 98.3 F (36.8 C), temperature source Oral, resp. rate 18, height 6\' 5"  (1.956 m), weight 105.2 kg, SpO2 97 %.    Medical Problem List and Plan: 1.  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  Antithrombotics: -DVT/anticoagulation:  Pharmaceutical: Lovenox--check dopplers in am.  10/5- Dopplers (-)  10/10- needs for 3 months from starting it             -antiplatelet therapy: N/A 3. Pain Management: N/A 4. Mood: LCSW to follow for evaluation and support.              -antipsychotic agents: N/A  10/10- will ask Neuropsych to see pt- when wife there, if possible.   10/12- emailed SW to put on schedule  10/14- saw Neuropsych yesterday 5. Neuropsych: This patient is capable of making decisions on his own behalf. 6. Sacral decub/Skin/Wound Care: Pictures show wound in different stages of healing. Ordered Air mattress overlay. Continue vitamin and prostat to promote healing. Will add Juven also for collagen. Will increase tube feed to 50 cc/hr as likely has higher nutritional need. Will consult dietician to help address nutritional needs.   10/4- will consult dietician- BUN up to 54 from 27 and Cr 1.19 from <1- asked to evaluate dryness  10/5- haven't seen a dietician note- will check on this.  10/9- less slough, so will ask WOC to see if can change to silver alginate, or something like that.  Also ordered neosporin for new wound around Urethral meatus  10/10- waiting for wound care to see- since weekend.   10/11: appreciate WOC eval  and recs  10/15- labs this AM to follow up on Na/K+ 7. Fluids/Electrolytes/Nutrition: Monitor I/Os.CMP ordered. Transition to bolus tube feeds next week if stable.   10/5- very dry- have placed Dietician order- need to help pt with BUN up to 54  10/7- Na 151- will ask Nutrition to see today and reassess 10/8- changed to increase water flushes 250cc/q4 hours- Labs Sunday  10/9- labs in AM  10/10- Na 154; Cr 0.94 and BUN 58- very dry!- spoke with dietician on call- will increase free water flushes and get off Nepro- not helping.   10/12- Na down to 154 from 156- nephrology has put on 400cc q2 hours- nephrology following  10/14- Na down to 147 in last 24 hours- will recheck in AM- appreciate Nephrology help  10/15- ordered labs for AM- pending- will  8. Orthostatic Hypotension: Secondary to autonomic dysfunction has resolved.              Monitor with increased activity, particularly for orthostasis.   10/12- BP soft still, at 30 degrees- will increase florinef to 0.2 mg daily.   10/15- BP very low this AM- 80s/50s laying down- if doesn't improve, might need to increase Midodrine- they are trying to get another abd binder  - increased midodrine to 10 mg TID since pt's BP wouldn't come up above 80 systolic this AM.  9. Acute on chronic anemia: Improving--continue to monitor. Added iron supplement.              Hgb stable 10/11 10. Hypernatremia/Hyperglycemia: Due to intermittent IV dextrose. Added water flushes. May need low sodium formula.   10/4- Na 144- doing OK  10/9- Na 151- rechecking in AM  10/11: 156- nephro consulted 11. Neurogenic bladder: Maintain foley due to sacral wounds.. 12. Neurogenic bowel:Continue senna in am with suppository in evenings for bowel program. Moving bowels regularly   10/1-0 having small BM last night with program 13. Secretions/Trach/capped: continue scopolamine patch  10/15- con't trach, because still needing suctioning- pt refused today, but sounds  like he  needs it.  14. Azotemia/NPO- getting TFs  Prior- Na got up to 156 and BUN up to 58- but BUN was 42 and Na down to 147 as of 10/14  10/15- Labs pending- will assess when they come up.   15. W/C requirement- will need either head array OR chin control power w/c- have already discussed with w/c rep/company- they are working on getting a demo for him.   16. Hypokalemia  10/14- will give repletion of KCL today 40 mEq x1.    10/15- labs pending 17. Spasticity  10/15- pt asking for spasticity meds- for annoying spasms- will try Baclofen 5 mg TID and see how he does.     LOS: 14 days A FACE TO FACE EVALUATION WAS PERFORMED  Ryan Day 07/07/2020, 9:39 AM

## 2020-07-07 NOTE — Progress Notes (Signed)
Orthopedic Tech Progress Note Patient Details:  Ryan Day [birth year] [id number] Was called to bring patient another ABDOMINAL BINDER. Got soiled. Dropped off to secretary  Ortho Devices Type of Ortho Device: Abdominal binder Ortho Device/Splint Location: STOMACH Ortho Device/Splint Interventions: Adjustment   Post Interventions Patient Tolerated: Well Instructions Provided: Care of device   [name] 07/07/2020, 10:18 AM

## 2020-07-07 NOTE — Progress Notes (Signed)
Patient ID: Ryan Day, male   DOB: May 28, 1951, 69 y.o.   MRN: [id number]  07/06/2020-SW received updates from Drew/[facility] reporting they do not accept pt insurance.   07/07/2020-SW spoke with Brianna/Intake with [facility] [phone]) declining referral due to staffing. SW spoke with [name]/[facility] [phone]) to discuss referral. SW waiting on follow-up.  *[name] reports she will speak with billing to see which insurance is primary. If [employer], pt will have a co-pay due insurance being out of state policy.   SW met with pt and pt wife in room to provide updates on challenges with obtaining HH, and possible additional co-pays that may be required due to insurance policy being out of state. Pt wife has been encouraged to follow-up with insurance to determine possible costs for [facility] services. SW discussed alterative options such as short term rehab. Pt and wife would like for patient to discharge to home.   Declined HHA: [facility]-insurance [facility] and care needs [facility]- insurance/out of network [facility]-not in network; 20-30% co pay [facility]- does not accept insurance [facility]- no staffing in area   [place], MSW, [place] Office: [phone] Cell: [phone] Fax: [phone]

## 2020-07-07 NOTE — Progress Notes (Signed)
Speech Language Pathology Daily Session Note  Patient Details  Name: Roemello Speyer MRN: [id number] Date of Birth: 12-03-1950  Today's Date: 07/07/2020 SLP Individual Time: 0071-2197 SLP Individual Time Calculation (min): 30 min  Short Term Goals: Week 3: SLP Short Term Goal 1 (Week 3): Patient will achieve and maintain adequate vocal intensity during trials and use of voice-activated adaptive equipement. SLP Short Term Goal 2 (Week 3): Patient will participate in trials of voice-activated and controlled devices with SLP. SLP Short Term Goal 3 (Week 3): Patient will consume PO trials of ice chips/small controlled water sips with minimal incidence of overt s/s of aspiration or penetration. SLP Short Term Goal 4 (Week 3): Patient will perform EMST and swallow strenghening exercises with setup A and supervisionA  Skilled Therapeutic Interventions:   Patient seen to address swallow and speech/voice goals. He was able to verbalize his wants clearly at phrase, sentence levels with SLP within 8-10 inches from him and he was able to be heard and understood by his wife on speakerphone when microphone half inch from his mouth. Patient exhibited two mild instances of throat clearing with ice chips and voice maintained at his current baseline. Patient understanding of and in agreement for swallow evaluation (repeat MBS) mid week next week. Patient continues to benefit from skilled SLP intervention to maximize swallow, speech/voice function prior to discharge.    Pain Pain Assessment Pain Scale: 0-10 Pain Score: 0-No pain  Therapy/Group: Individual Therapy  Sonia Baller, MA, CCC-SLP Speech Therapy

## 2020-07-07 NOTE — Progress Notes (Signed)
Physical Therapy Session Note  Patient Details  Name: Ryan Day MRN: [id number] Date of Birth: 03/14/1951  Today's Date: 07/07/2020 PT Individual Time: 1257-1407 PT Individual Time Calculation (min): 70 min   Short Term Goals: Week 1:  PT Short Term Goal 1 (Week 1): Pt will begin to instruct caregiver/staff in bed mobility and transfers. PT Short Term Goal 1 - Progress (Week 1): Progressing toward goal PT Short Term Goal 2 (Week 1): Pt will increase sitting tolerance on edge of bed to 30 minutes assist with ADL/mobility PT Short Term Goal 2 - Progress (Week 1): Progressing toward goal PT Short Term Goal 3 (Week 1): Pt will increase sitting balance on edge of bed to mod to max A to assist with ADLs/mobility PT Short Term Goal 3 - Progress (Week 1): Progressing toward goal PT Short Term Goal 4 (Week 1): Pt will propel power w/c on level surfaces with S and verbal cues. PT Short Term Goal 4 - Progress (Week 1): Other (comment) (working towards obtaining proper equipment) Week 2:  PT Short Term Goal 1 (Week 2): Pt will begin instructing caregivers/staff in assisting him with bed mobility and transfers PT Short Term Goal 2 (Week 2): Pt will recall pressure relief schedule of every 30 min x 2 min PT Short Term Goal 3 (Week 2): Pt will initiate power w/c mobility  Skilled Therapeutic Interventions/Progress Updates:    pt received in bed and agreeable to therapy. Pt denied pain at start and end of session however during rolling reported some pain in L shoulder, did not rank and reported relief with repositioning. Pt's BP taken as found in semi-reclined position in bed to 113/90 pt agreeable to getting OOB for WC evaluation with representative present. Pt required donning of abdominal binder for mobility, directed in rolling to L and at this point Pt found to require brief, pants, and bedding change with large BM. Pt directed in rolling to R and L several times throughout session dependent x2 to  complete, dependent for peri-care and all clothing and equipment changes. Pt in TED hose and ace wrapping at start of session, these left in place; donned clean brief and pants. Pt then directed in final roll for lift pad placement, use of maxi move for transfer into TIS WC with head controls to allow WC representative to educate pt on WC's features. WC reclines slightly for comfort and BP management. Pt's BP at end of session was 90/65 and denied all symptoms, nursing made aware during session and agreeable to pt remaining the chair. Pt left with WC representative at end of session and staff for lab draws.   PT went back to pt's room once lab tech was finished and ensured pt able to use call light clipped to R shoulder for needs and in good condition and all needs met. Pt agreeable to remaining in WC at that time. Nursing made aware.  Therapy Documentation Precautions:  Precautions Precautions: Cervical Precaution Booklet Issued: No Precaution Comments: C collar on when egde of bed, upright or OOB Required Braces or Orthoses: Cervical Brace Cervical Brace: Hard collar, Other (comment) (C collar when edge of bed, upright or OOB) Restrictions Weight Bearing Restrictions: No Vital Signs: Therapy Vitals Temp: (!) 97 F (36.1 C) Pulse Rate: 64 Resp: 16 BP: (!) 85/61 Patient Position (if appropriate): Sitting Oxygen Therapy SpO2: 100 % O2 Device: Room Air    Therapy/Group: Individual Therapy  Haley S Morelli 07/07/2020, 3:19 PM  

## 2020-07-07 NOTE — Progress Notes (Signed)
Occupational Therapy Session Note  Patient Details  Name: Antwine Agosto MRN: [id number] Date of Birth: Jun 26, 1951  Today's Date: 07/07/2020 OT Individual Time: 2178-3754 OT Individual Time Calculation (min): 59 min    Short Term Goals: Week 2:  OT Short Term Goal 1 (Week 2): Pt will maintain static sitting balnace wiht MAX A of 1 caregiver for 3 min at EOB/EOM OT Short Term Goal 1 - Progress (Week 2): Progressing toward goal OT Short Term Goal 2 (Week 2): Pt will direct functional transfers with max VC OT Short Term Goal 2 - Progress (Week 2): Progressing toward goal OT Short Term Goal 3 (Week 2): Pt will roll B in bed with MOD +2 A OT Short Term Goal 3 - Progress (Week 2): Progressing toward goal  Skilled Therapeutic Interventions/Progress Updates:  Patient met lying supine in bed in agreement with OT treatment session. 0/10 pain at rest or with activity. Patient with increased secretion production throughout session with ability to direct placement of suction. Patient with desire to [facility] hospital gown and donn clothing from home. Vitals assessed in supine with BP of 85/58. Thigh high TEDs and ace wraps donned with BP increasing to 94/64. RN and MD notified. Patient dependent +2 to don LB clothing in supine with ability to elevate shoulder and turn head R<>L in prep for rolling. Bed positioned in semi chair position with increase of BP to 139/68. Total A +2 to don UB clothing with noted 2 breath subluxation of R shoulder. Total A to wash face. BUE supported on pillows to maintain integrity of shoulder joint capsule. With bed in Trendelenburg chair position BP dropped to 92/61. Patient returned to supine 2/2 lability of BP. Session concluded with patient lying supine in bed with sip and puff call light and suction positioned and all needs met.   Therapy Documentation Precautions:  Precautions Precautions: Cervical Precaution Booklet Issued: No Precaution Comments: C collar on when egde of bed,  upright or OOB Required Braces or Orthoses: Cervical Brace Cervical Brace: Hard collar, Other (comment) (C collar when edge of bed, upright or OOB) Restrictions Weight Bearing Restrictions: No General:    Therapy/Group: Individual Therapy  Minnette Merida R Howerton-Davis 07/07/2020, 7:32 AM

## 2020-07-07 NOTE — Progress Notes (Signed)
Patient noted awake & alert at the beginning of the shift. No c/o pain or discomfort. His medication was given as ordered & tolerated well. His Peg tube is patent & he was given his feeding as prescribed without difficulty, followed by 419ml flush. His CBG was taken & he was given his insulin coverage. Nurse tech offered him a bath as scheduled & the patient refused stating "in the morning".  In the morning, patient has a CHG bath scheduled. Thigh high teds & ace wraps were still on at the time, so they were removed. Resting hand splints & ortho boots were applied. Later into the shift, NT alerted that he was refusing turning & repositioning & incontinence checks. Each time that this nurse went into his room, patient was asleep, but aroused easily. Patient was awakened & asked about refusing care. He was educated abouit the importance of turning & repositioning & nurse tech was called to assist. Noticed that the foley securement device was not on & as this nurse went to acquire one, nurse techs checked the patient for incontinence. Patient had another bowel movement & required incontinence care. Incontinence & foley care was given. Was informed in report that the head of the patients penis had started to split due to the foley catheter. Patient had been noted to have a pressure injury also in that area. When noted, the pressure injury looks now like a pink discoloration. A foley anchor was obtained & placed. His sacral/buttock dressings were soiled & removed. Areas cleansed with normal saline & redressed as ordered. Areas were measured. The left buttock & coccyx pressure injuries are now connected & there is still necrotic tissue present. It bleeds easily. The right buttock pressure injury has 100% granulation tissue at this time. When dressed & turned, patient was educated again about turning for the healing of his wounds. He verbalized understanding. Lurline Idol is secured & capped. He coughed up a moderate amount of  frothy sputum while we were working with him. Oxygen saturation is WNL. No acute distress noted. Will continue to monitor.

## 2020-07-07 NOTE — Progress Notes (Signed)
Speech Language Pathology Weekly Progress and Session Note  Patient Details  Name: Ryan Day MRN: [id number] Date of Birth: [birth year]  Beginning of progress report period: June 30, 2020 End of progress report period: July 07, 2020  Today's Date: 07/07/2020 SLP Individual Time: 1000-1030 SLP Individual Time Calculation (min): 30 min  Short Term Goals: Week 2: SLP Short Term Goal 1 (Week 2): Patient will perform pharyngeal swallow exercises with minA. SLP Short Term Goal 1 - Progress (Week 2): Met SLP Short Term Goal 2 (Week 2): Patient will perform EMST 20-25 reps at 7-8 resistance. SLP Short Term Goal 2 - Progress (Week 2): Progressing toward goal SLP Short Term Goal 3 (Week 2): Patient will maintain adequate vocal intensity at 10-12 word phrase/sentence level. SLP Short Term Goal 3 - Progress (Week 2): Met SLP Short Term Goal 4 (Week 2): Patient will manage pharyngeal residuals during ice chip trials with throat clear, hard swallow to clear as evidenced by vocal quality returning to baseline. SLP Short Term Goal 4 - Progress (Week 2): Met SLP Short Term Goal 5 (Week 2): Patient will consume PO trials of ice chips/small controlled water sips with minimal incidence of overt s/s of aspiration or penetration. SLP Short Term Goal 6 (Week 2): Patient will perform EMST and swallow strenghening exercises with setup A and supervisionA    New Short Term Goals: Week 3: SLP Short Term Goal 1 (Week 3): Patient will achieve and maintain adequate vocal intensity during trials and use of voice-activated adaptive equipement. SLP Short Term Goal 2 (Week 3): Patient will participate in trials of voice-activated and controlled devices with SLP. SLP Short Term Goal 3 (Week 3): Patient will consume PO trials of ice chips/small controlled water sips with minimal incidence of overt s/s of aspiration or penetration. SLP Short Term Goal 4 (Week 3): Patient will perform EMST and swallow strenghening  exercises with setup A and supervisionA  Weekly Progress Updates:  Patient has made very good progress with his goals and his voice quality and intensity has improved to the point that he is able to communicate at sentence and conversational level and has started to use voice to activate phone assistant to call his wife, etc. Patient continues with low vocal intensity and hoarse voice but breath support has improved. Lurline Idol is capped and patient has been on room air. Trials of ice chips with SLP and patient is currently on water protocol as well with plan for repeat MBS next week.    Intensity: Minumum of 1-2 x/day, 30 to 90 minutes Frequency: 3 to 5 out of 7 days Duration/Length of Stay: 10/28 Treatment/Interventions: Cueing hierarchy;Dysphagia/aspiration precaution training;Therapeutic Exercise;Patient/family education   Daily Session  Skilled Therapeutic Interventions: Patient participated in ice chip trials and swallow function exercises (hard swallows, throat clearing, etc) with SLP providing supervision to minA verbal cues. Patient exhibited minimal frequency and intensity of throat clearing but did expectorate (via yaunkers oral suction after patient throat clearing) pharyngeal secretions which were clear-white. Patient continues to benefit from skilled SLP intervention to maximize swallow, speech/voice function prior to discharge.      General    Pain Pain Assessment Pain Scale: 0-10 Pain Score: 0-No pain  Therapy/Group: Individual Therapy  Sonia Baller, MA, CCC-SLP Speech Therapy

## 2020-07-08 DIAGNOSIS — S14109S Unspecified injury at unspecified level of cervical spinal cord, sequela: Secondary | ICD-10-CM | POA: Diagnosis not present

## 2020-07-08 LAB — GLUCOSE, CAPILLARY
Glucose-Capillary: 95 mg/dL (ref 70–99)
Glucose-Capillary: 186 mg/dL — ABNORMAL HIGH (ref 70–99)

## 2020-07-08 NOTE — Progress Notes (Addendum)
Bowel program completed this evening. Patient had medium amount of semi loose stool as a result of dig stim x2. Due to stool consistency-difficult to fully empty rectal vault. Patient turned per schedule. Swelling still noted around head of penis. Very difficult to keep foreskin down in place.

## 2020-07-08 NOTE — Progress Notes (Signed)
MD Cyril Mourning notified of swelling around head of penis. MD assessed area and able to return foreskin back in place partially. Posterior swelling prevents it from staying fully in place at this time.  No additional orders received. Will continue to monitor and assess.

## 2020-07-08 NOTE — Progress Notes (Signed)
Smear on brief, Dig stim at 2130, with large loose results. Foam dressing changed to sacrum, after incontinent BM. Abd. Soft, but distended. Hydrocolloid dressing around peg site, ? Remove and place split gauze. Bilateral PRAFO's and WHO's applied. Turned every 3 hours, tolerated without complaint of. Foley patent. Patient uncircumcised- Unable to return foreskin back to normal position, because of swelling. Did not de-sat over past 12 hours. #6 Shiley capped. Exchanged inner cannula, old cannula without secretions inside.  Able to cough up secretions with [place] attached to bed. Assisted with quad coughing this AM, helped per patient. Deep suctioned x 1 this shift. 400cc water flushed every 2 hours. Patrici Ranks A

## 2020-07-08 NOTE — Progress Notes (Signed)
[place] PHYSICAL MEDICINE & REHABILITATION PROGRESS NOTE   Subjective/Complaints: Pt alert this am, decreased breath support but able to speck in short sentences (as expected with level of injury)  RN notes duoderm dressing around PEG site, also with retracted foreskin   Labs pending ROS:   Pt denies SOB, abd pain, CP, - N/V, + neurogenic bowel and bladder no BM without bowel program, has foley    Objective:   No results found. No results for input(s): WBC, HGB, HCT, PLT in the last 72 hours. Recent Labs    07/05/20 0956 07/07/20 1533  NA 147* 140  K 3.4* 3.8  CL 109 102  CO2 27 28  GLUCOSE 239* 181*  BUN 42* 36*  CREATININE 0.95 0.72  CALCIUM 9.3 9.3    Intake/Output Summary (Last 24 hours) at 07/08/2020 0935 Last data filed at 07/08/2020 2979 Gross per 24 hour  Intake 474 ml  Output 1125 ml  Net -651 ml     Pressure Injury 06/23/20 Coccyx Medial;Left Unstageable - Full thickness tissue loss in which the base of the injury is covered by slough (yellow, tan, gray, green or brown) and/or eschar (tan, brown or black) in the wound bed. upper left sacrum is [name] (Active)  06/23/20 1710  Location: Coccyx  Location Orientation: Medial;Left  Staging: Unstageable - Full thickness tissue loss in which the base of the injury is covered by slough (yellow, tan, gray, green or brown) and/or eschar (tan, brown or black) in the wound bed.  Wound Description (Comments): upper left sacrum is unstageable and has a leathery covering over the wound bed  Present on Admission: Yes     Pressure Injury 06/23/20 Buttocks Right Stage 3 -  Full thickness tissue loss. Subcutaneous fat may be visible but bone, tendon or muscle are NOT exposed. healing stage 3 (Active)  06/23/20 1838  Location: Buttocks  Location Orientation: Right  Staging: Stage 3 -  Full thickness tissue loss. Subcutaneous fat may be visible but bone, tendon or muscle are NOT exposed.  Wound Description (Comments):  healing stage 3  Present on Admission: Yes     Pressure Injury 06/23/20 Left Stage 3 -  Full thickness tissue loss. Subcutaneous fat may be visible but bone, tendon or muscle are NOT exposed. healing stage 3 (Active)  06/23/20 1839  Location:   Location Orientation: Left  Staging: Stage 3 -  Full thickness tissue loss. Subcutaneous fat may be visible but bone, tendon or muscle are NOT exposed.  Wound Description (Comments): healing stage 3  Present on Admission: Yes     Pressure Injury 06/30/20 Penis Medial;Posterior Stage 2 -  Partial thickness loss of dermis presenting as a shallow open injury with a red, pink wound bed without slough. catheter sized indention and opening of skin  of urethral meatus on posterior side  (Active)  06/30/20 1845  Location: Penis  Location Orientation: Medial;Posterior  Staging: Stage 2 -  Partial thickness loss of dermis presenting as a shallow open injury with a red, pink wound bed without slough.  Wound Description (Comments): catheter sized indention and opening of skin  of urethral meatus on posterior side of the urethral meatus  Present on Admission:     Physical Exam: Vital Signs Blood pressure 127/84, pulse 75, temperature 97.6 F (36.4 C), resp. rate 18, height 6\' 5"  (1.956 m), weight 105.2 kg, SpO2 98 %. .  General: No acute distress Mood and affect are appropriate Heart: Regular rate and rhythm no rubs murmurs  or extra sounds Lungs: upper airway sounds on RIght side, (pt laying on R) , breathing unlabored, no rales or wheezes Abdomen: Positive bowel sounds, soft nontender to palpation, nondistended Extremities: No clubbing, cyanosis, or edema Skin: PEG site with duoderm dressing, some crusting around tube   Skin: Stage II/deep or superficial Stage III- more slough is resolved (not all of it)- bright pink/granulating overall-    GU- foreskin retracted but reduceable, no tenderness, meatus without blood or d/c Ox3- quiet Motor: B/l UE  shoulder abduction 1+/5, distally 0/5 with ?trace flicker left wrist flexor B/l LE: 0/5 proximal to distal Sensation diminished to light touch in all extremities Psych: less interactive today, quiet-      Assessment/Plan: 1. 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  Physiatrist is providing close team supervision and 24 hour management of active medical problems listed below.  Physiatrist and rehab team continue to assess barriers to discharge/monitor patient progress toward functional and medical goals  Care Tool:  Bathing              Bathing assist Assist Level: 2 Helpers     Upper Body Dressing/Undressing Upper body dressing   What is the patient wearing?: Pull over shirt    Upper body assist Assist Level: 2 Helpers    Lower Body Dressing/Undressing Lower body dressing      What is the patient wearing?: Incontinence brief, Pants     Lower body assist Assist for lower body dressing: 2 Helpers     Toileting Toileting    Toileting assist Assist for toileting: Dependent - Patient 0%     Transfers Chair/bed transfer  Transfers assist  Chair/bed transfer activity did not occur: Safety/medical concerns  Chair/bed transfer assist level: Dependent - mechanical lift     Locomotion Ambulation   Ambulation assist   Ambulation activity did not occur: Safety/medical concerns          Walk 10 feet activity   Assist           Walk 50 feet activity   Assist           Walk 150 feet activity   Assist           Walk 10 feet on uneven surface  activity   Assist           Wheelchair     Assist Will patient use wheelchair at discharge?: Yes Type of Wheelchair: Power Wheelchair activity did not occur: Safety/medical concerns         Wheelchair 50 feet with 2 turns activity    Assist    Wheelchair 50 feet  with 2 turns activity did not occur: Safety/medical concerns       Wheelchair 150 feet activity     Assist  Wheelchair 150 feet activity did not occur: Safety/medical concerns       Blood pressure 127/84, pulse 75, temperature 97.6 F (36.4 C), resp. rate 18, height 6\' 5"  (1.956 m), weight 105.2 kg, SpO2 98 %.    Medical Problem List and Plan: 1.  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  Antithrombotics: -DVT/anticoagulation:  Pharmaceutical:  Lovenox--check dopplers in am.  10/5- Dopplers (-)  10/10- needs for 3 months from starting it             -antiplatelet therapy: N/A 3. Pain Management: N/A 4. Mood: LCSW to follow for evaluation and support.              -antipsychotic agents: N/A   Neuropsych following  5. Neuropsych: This patient is capable of making decisions on his own behalf. 6. Sacral decub/Skin/Wound Care: Pictures show wound in different stages of healing. Ordered Air mattress overlay. Continue vitamin and prostat to promote healing. Will add Juven also for collagen. Will increase tube feed to 50 cc/hr as likely has higher nutritional need. Will consult dietician to help address nutritional needs.   10/4- will consult dietician- BUN up to 54 from 27 and Cr 1.19 from <1- asked to evaluate dryness  10/5- haven't seen a dietician note- will check on this.  10/9- less slough, so will ask WOC to see if can change to silver alginate, or something like that.  Also ordered neosporin for new wound around Urethral meatus  10/10- waiting for wound care to see- since weekend.   10/11: appreciate WOC eval and recs  7. Fluids/Electrolytes/Nutrition: Monitor I/Os.CMP ordered. Transition to bolus tube feeds next week if stable.   Hypernatremia dn pre rennal azotemia- nephro  8. Orthostatic Hypotension: Secondary to autonomic dysfunction has  resolved.               Vitals:   07/08/20 0402 07/08/20 0822  BP: 127/84   Pulse: 77 75  Resp:  18  Temp: 97.6 F (36.4 C)   SpO2: 97% 98%   9. Acute on chronic anemia: Improving--continue to monitor. Added iron supplement.              Hgb stable 10/11 10. Hypernatremia/: Added water flushes. May need low sodium formula.   10/4- Na 144- doing OK  10/9- Na 151- rechecking in AM  10/11: 156- nephro consulted Responding to free H20 11. Neurogenic bladder: Maintain foley due to sacral wounds.. 12. Neurogenic bowel:Continue senna in am with suppository in evenings for bowel program. Moving bowels regularly   10/1-0 having small BM last night with program 13. Secretions/Trach/capped: continue scopolamine patch  10/15- con't trach, because still needing suctioning- pt refused today, but sounds like he needs it.  14. Azotemia/NPO- getting TFs  Prior- Na got up to 156 and BUN up to 58- but BUN was 42 and Na down to 147 as of 10/14  10/15- Labs pending- will assess when they come up.   15. W/C requirement- will need either head array OR chin control power w/c- have already discussed with w/c rep/company- they are working on getting a demo for him.   16. Hypokalemia  10/14- will give repletion of KCL today 40 mEq x1.    10/15- labs pending 17. Spasticity  10/15- pt asking for spasticity meds- for annoying spasms- will try Baclofen 5 mg TID and see how he does.     LOS: 15 days A FACE TO [place] E Rashana Cordney [place] 07/08/2020, 9:35 AM

## 2020-07-08 NOTE — Plan of Care (Signed)
  Problem: Consults Goal: RH SPINAL CORD INJURY PATIENT EDUCATION Description: Patient and family will gain knowledge of disease management, pain management, bowel and bladder management, and skin and wound care during this admission. Outcome: Progressing Goal: Skin Care Protocol Initiated - if Braden Score 18 or less Description: If consults are not indicated, leave blank or document N/A Outcome: Progressing Goal: Nutrition Consult-if indicated Outcome: Progressing Goal: Diabetes Guidelines if Diabetic/Glucose > 140 Description: If diabetic or lab glucose is > 140 mg/dl - Initiate Diabetes/Hyperglycemia Guidelines & Document Interventions  Outcome: Progressing   Problem: SCI BOWEL ELIMINATION Goal: RH STG MANAGE BOWEL WITH ASSISTANCE Description: STG Manage Bowel with mod to max Assistance. Outcome: Progressing Goal: RH STG SCI MANAGE BOWEL WITH MEDICATION WITH ASSISTANCE Description: STG SCI Manage bowel with medication with mod to max assistance. Outcome: Progressing Goal: RH STG SCI MANAGE BOWEL PROGRAM W/ASSIST OR AS APPROPRIATE Description: STG SCI Manage bowel program mod to max /assist or as appropriate. Outcome: Progressing   Problem: SCI BLADDER ELIMINATION Goal: RH STG MANAGE BLADDER WITH EQUIPMENT WITH ASSISTANCE Description: STG Manage Bladder With Equipment With mod to max Assistance Outcome: Progressing Goal: RH STG SCI MANAGE BLADDER PROGRAM W/ASSISTANCE Description: mod to max assist Outcome: Progressing   Problem: RH SKIN INTEGRITY Goal: RH STG SKIN FREE OF INFECTION/BREAKDOWN Description: mod to max assist Outcome: Progressing Goal: RH STG ABLE TO PERFORM INCISION/WOUND CARE W/ASSISTANCE Description: STG Able To Perform Incision/Wound Care With mod to max  Assistance. Outcome: Progressing   Problem: RH SAFETY Goal: RH STG ADHERE TO SAFETY PRECAUTIONS W/ASSISTANCE/DEVICE Description: STG Adhere to Safety Precautions With mod to max  Assistance/Device. Outcome: Progressing Goal: RH STG DECREASED RISK OF FALL WITH ASSISTANCE Description: STG Decreased Risk of Fall With mod to max Assistance. Outcome: Progressing   Problem: RH PAIN MANAGEMENT Goal: RH STG PAIN MANAGED AT OR BELOW PT'S PAIN GOAL Description: <4 on a 0-10 pain scale Outcome: Progressing   Problem: RH KNOWLEDGE DEFICIT SCI Goal: RH STG INCREASE KNOWLEDGE OF SELF CARE AFTER SCI Description: mod to max assist Outcome: Progressing

## 2020-07-09 ENCOUNTER — Encounter (HOSPITAL_COMMUNITY): Payer: BC Managed Care – PPO | Admitting: Speech Pathology

## 2020-07-09 ENCOUNTER — Ambulatory Visit (HOSPITAL_COMMUNITY): Payer: BC Managed Care – PPO

## 2020-07-09 ENCOUNTER — Inpatient Hospital Stay (HOSPITAL_COMMUNITY): Payer: BC Managed Care – PPO

## 2020-07-09 ENCOUNTER — Encounter (HOSPITAL_COMMUNITY): Payer: BC Managed Care – PPO | Admitting: Occupational Therapy

## 2020-07-09 DIAGNOSIS — S14109S Unspecified injury at unspecified level of cervical spinal cord, sequela: Secondary | ICD-10-CM | POA: Diagnosis not present

## 2020-07-09 LAB — GLUCOSE, CAPILLARY
Glucose-Capillary: 104 mg/dL — ABNORMAL HIGH (ref 70–99)
Glucose-Capillary: 218 mg/dL — ABNORMAL HIGH (ref 70–99)

## 2020-07-09 IMAGING — DX DG CHEST 1V PORT
1 series · 1 of 1 positions shown · non-contrast
Comparison: [DATE] chest radiograph and prior.

CLINICAL DATA: Cough.

EXAM:
PORTABLE CHEST 1 VIEW

[chest ap]
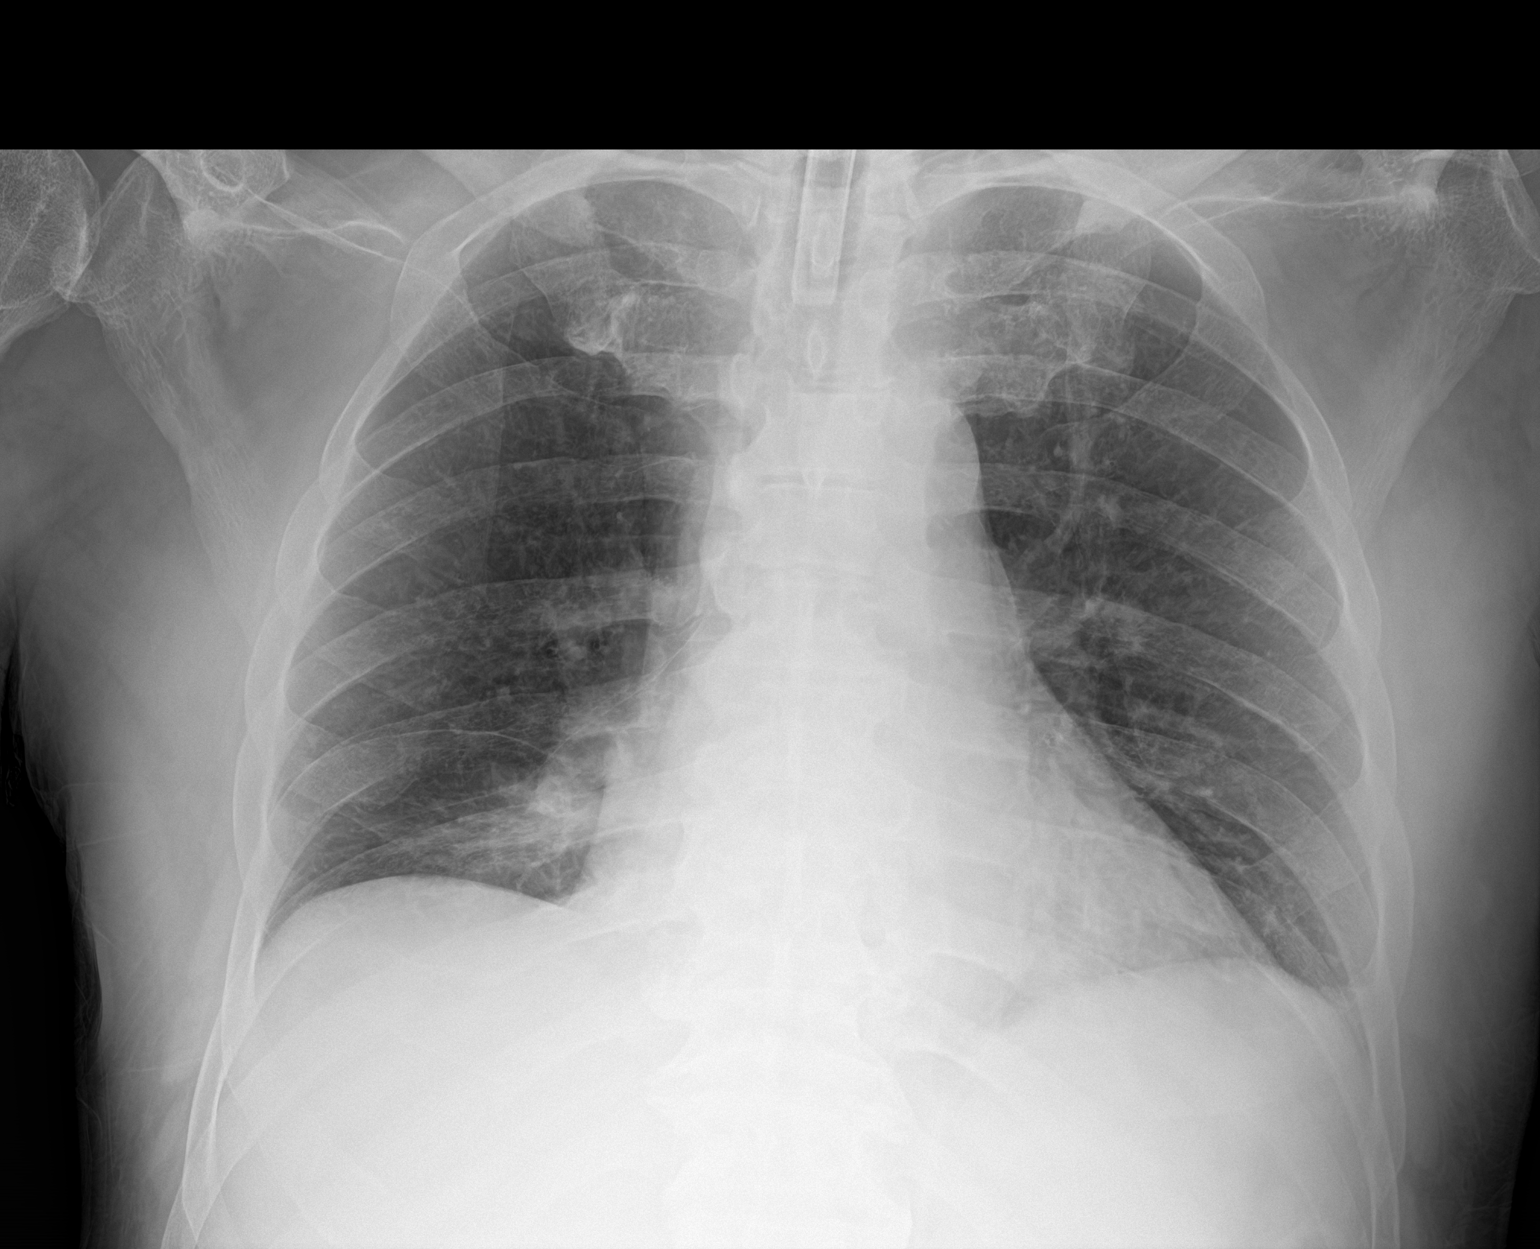

[1 of 1 positions shown; findings below may reference images not displayed]

FINDINGS: Indwelling tracheostomy tube overlies the midline air column. Medial
right basilar patchy opacities. No pneumothorax or pleural effusion.
Mild cardiomegaly. Osseous structures are unchanged.
IMPRESSION: Patchy medial right basilar opacities. Differential includes
infection, aspiration and atelectasis.

## 2020-07-09 NOTE — Progress Notes (Addendum)
[facility] PHYSICAL MEDICINE & REHABILITATION PROGRESS NOTE   Subjective/Complaints: Per RT pt had more secretions this am , required suction, discussed with RN Still with retracted foreskin per RN  Labs pending ROS:   Pt denies SOB, abd pain, CP, - N/V, + neurogenic bowel and bladder no BM without bowel program, has foley    Objective:   No results found. No results for input(s): WBC, HGB, HCT, PLT in the last 72 hours. Recent Labs    07/07/20 1533  NA 140  K 3.8  CL 102  CO2 28  GLUCOSE 181*  BUN 36*  CREATININE 0.72  CALCIUM 9.3    Intake/Output Summary (Last 24 hours) at 07/09/2020 0736 Last data filed at 07/09/2020 0600 Gross per 24 hour  Intake --  Output 2775 ml  Net -2775 ml     Pressure Injury 06/23/20 Buttocks Right Stage 3 -  Full thickness tissue loss. Subcutaneous fat may be visible but bone, tendon or muscle are NOT exposed. healing stage 3 (Active)  06/23/20 1838  Location: Buttocks  Location Orientation: Right  Staging: Stage 3 -  Full thickness tissue loss. Subcutaneous fat may be visible but bone, tendon or muscle are NOT exposed.  Wound Description (Comments): healing stage 3  Present on Admission: Yes     Pressure Injury 06/23/20 Left Stage 3 -  Full thickness tissue loss. Subcutaneous fat may be visible but bone, tendon or muscle are NOT exposed. healing stage 3 (Active)  06/23/20 1839  Location:   Location Orientation: Left  Staging: Stage 3 -  Full thickness tissue loss. Subcutaneous fat may be visible but bone, tendon or muscle are NOT exposed.  Wound Description (Comments): healing stage 3  Present on Admission: Yes     Pressure Injury 06/30/20 Penis Medial;Posterior Stage 2 -  Partial thickness loss of dermis presenting as a shallow open injury with a red, pink wound bed without slough. catheter sized indention and opening of skin  of urethral meatus on posterior side  (Active)  06/30/20 1845  Location: Penis  Location Orientation:  Medial;Posterior  Staging: Stage 2 -  Partial thickness loss of dermis presenting as a shallow open injury with a red, pink wound bed without slough.  Wound Description (Comments): catheter sized indention and opening of skin  of urethral meatus on posterior side of the urethral meatus  Present on Admission:     Physical Exam: Vital Signs Blood pressure 108/67, pulse 79, temperature 98.2 F (36.8 C), temperature source Oral, resp. rate (!) 22, height 6\' 5"  (1.956 m), weight 105.2 kg, SpO2 98 %. .  General: No acute distress Mood and affect are appropriate Heart: Regular rate and rhythm no rubs murmurs or extra sounds Lungs: upper airway sounds on RIght side, (pt laying on R) , breathing unlabored, no rales or wheezes Abdomen: Positive bowel sounds, soft nontender to palpation, nondistended Extremities: No clubbing, cyanosis, or edema Skin: PEG site with duoderm dressing, some crusting around tube   Skin: Stage II/deep or superficial Stage III- more slough is resolved (not all of it)- bright pink/granulating overall-    GU- foreskin retracted but reduceable, no tenderness, meatus without blood or d/c Able to reduce but then retracts again  Ox3- quiet Motor: B/l UE shoulder abduction 1+/5, distally 0/5 with ?trace flicker left wrist flexor B/l LE: 0/5 proximal to distal Sensation diminished to light touch in all extremities      Assessment/Plan: 1. 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  Physiatrist is providing close team supervision and 24 hour management of active medical problems listed below.  Physiatrist and rehab team continue to assess barriers to discharge/monitor patient progress toward functional and medical goals  Care Tool:  Bathing              Bathing assist Assist Level: 2 Helpers     Upper Body Dressing/Undressing Upper body dressing     What is the patient wearing?: Pull over shirt    Upper body assist Assist Level: 2 Helpers    Lower Body Dressing/Undressing Lower body dressing      What is the patient wearing?: Incontinence brief, Pants     Lower body assist Assist for lower body dressing: 2 Helpers     Toileting Toileting    Toileting assist Assist for toileting: Dependent - Patient 0%     Transfers Chair/bed transfer  Transfers assist  Chair/bed transfer activity did not occur: Safety/medical concerns  Chair/bed transfer assist level: Dependent - mechanical lift     Locomotion Ambulation   Ambulation assist   Ambulation activity did not occur: Safety/medical concerns          Walk 10 feet activity   Assist           Walk 50 feet activity   Assist           Walk 150 feet activity   Assist           Walk 10 feet on uneven surface  activity   Assist           Wheelchair     Assist Will patient use wheelchair at discharge?: Yes Type of Wheelchair: Power Wheelchair activity did not occur: Safety/medical concerns         Wheelchair 50 feet with 2 turns activity    Assist    Wheelchair 50 feet with 2 turns activity did not occur: Safety/medical concerns       Wheelchair 150 feet activity     Assist  Wheelchair 150 feet activity did not occur: Safety/medical concerns       Blood pressure 108/67, pulse 79, temperature 98.2 F (36.8 C), temperature source Oral, resp. rate (!) 22, height 6\' 5"  (1.956 m), weight 105.2 kg, SpO2 98 %.    Medical Problem List and Plan: 1.  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  Antithrombotics: -DVT/anticoagulation:  Pharmaceutical: Lovenox--check dopplers in am.  10/5- Dopplers (-)  10/10- needs for 3 months from starting it             -antiplatelet therapy: N/A  3. Pain Management: N/A 4. Mood: LCSW to follow for evaluation and support.              -antipsychotic agents: N/A   Neuropsych following  5. Neuropsych: This patient is capable of making decisions on his own behalf. 6. Sacral decub/Skin/Wound Care: Pictures show wound in different stages of healing. Ordered Air mattress overlay. Continue vitamin and prostat to promote healing. Will add Juven also for collagen. Will increase tube feed to 50 cc/hr as likely has higher nutritional need. Will consult dietician to help address nutritional needs.   10/4- will consult dietician- BUN up to 54 from 27 and Cr 1.19 from <1- asked to evaluate dryness  10/5- haven't seen a dietician note- will check on this.  10/9- less slough, so will ask WOC to see if can change to silver alginate, or something like that.  Also ordered neosporin for new wound around Urethral meatus  10/10- waiting for wound care to see- since weekend.   10/11: appreciate WOC eval and recs  7. Fluids/Electrolytes/Nutrition: Monitor I/Os.CMP ordered. Transition to bolus tube feeds next week if stable.   Hypernatremia dn pre rennal azotemia- nephro  8. Orthostatic Hypotension: Secondary to autonomic dysfunction has resolved.               Vitals:   07/09/20 0456 07/09/20 0456  BP:  108/67  Pulse: 73 79  Resp: 17 (!) 22  Temp:  98.2 F (36.8 C)  SpO2:  98%   9. Acute on chronic anemia: Improving--continue to monitor. Added iron supplement.              Hgb stable 10/11 10. Hypernatremia/: Added water  flushes. May need low sodium formula.   10/4- Na 144- doing OK  10/9- Na 151- rechecking in AM  10/11: 156- nephro consulted Responding to free H20 11. Neurogenic bladder: Maintain foley due to sacral wounds.. 12. Neurogenic bowel:Continue senna in am with suppository in evenings for bowel program. Moving bowels regularly   10/1-0 having small BM last night with program 13. Secretions/Trach/capped: continue scopolamine patch  10/15- con't trach, because still needing suctioning- pt refused today, but sounds like he needs it.  Cough with clear sputum, had desat requiring O2 this am which has resolved no CP or SOB will check portable xray  No signs of pum edema, ? RLL infiltrate but looks minimally changed since 9/27 14. Azotemia/NPO- getting TFs  Prior- Na got up to 156 and BUN up to 58- but BUN was 42 and Na down to 147 as of 10/14  10/15- Labs pending- will assess when they come up.   15. W/C requirement- will need either head array OR chin control power w/c- have already discussed with w/c rep/company- they are working on getting a demo for him.   16. Hypokalemia  10/14- will give repletion of KCL today 40 mEq x1.    10/15- labs pending 17. Spasticity  10/15- pt asking for spasticity meds- for annoying spasms- will try Baclofen 5 mg TID and see how he does.   18.  Retracted foreskin with chronic foley, no sign of ischemia, edema posterior foreskin, may be related to increased peripheral edema although no scrotal swelling - discussed with Uro on call Dr Sheppard Coil who felt that Uro eval was not needed at this time  LOS: 16 days A FACE TO [place] E Shalla Bulluck 07/09/2020, 7:36 AM

## 2020-07-09 NOTE — Progress Notes (Signed)
Speech Language Pathology Daily Session Note  Patient Details  Name: Ryan Day MRN: [id number] Date of Birth: October 04, 1950  Today's Date: 07/09/2020 SLP Individual Time: 0950-1015 SLP Individual Time Calculation (min): 25 min  Short Term Goals: Week 3: SLP Short Term Goal 1 (Week 3): Patient will achieve and maintain adequate vocal intensity during trials and use of voice-activated adaptive equipement. SLP Short Term Goal 2 (Week 3): Patient will participate in trials of voice-activated and controlled devices with SLP. SLP Short Term Goal 3 (Week 3): Patient will consume PO trials of ice chips/small controlled water sips with minimal incidence of overt s/s of aspiration or penetration. SLP Short Term Goal 4 (Week 3): Patient will perform EMST and swallow strenghening exercises with setup A and supervisionA  Skilled Therapeutic Interventions:  Pt was seen for skilled ST targeting family education in anticipation of 10/28 discharge date.  [name] and daughter were at bedside.  [name] presented with questions related to pt's decreased speech intelligibility and SLP provided skilled education regarding pt's current challenges with decreased breath support for speech.  Both pt and family endorsed noticing improvements in his speech intelligibility while on CIR and that they've been able to understand him over the phone daily.  Pt's family could also teach back how to use RMST device and precautions for when to stop device use (pain, dizziness, lightheadedness).  SLP reinforced use of 1-10 rating of effort scale to monitor toleration while doing exercises.  Pt's family verbalized understanding of pt's current swallowing function and plans for repeat MBS this week.  They recalled 3 targeted pharyngeal strengthening exercises and were provided with a handout to maximize carryover of exercise program in the home environment.  They could teach back procedures for implementing the water protocol with small  amounts of ice chips but needed further explanation for rationale of water protocol and the importance of aggressive oral hygiene while pt is NPO.  All questions were answered to pt's and family's satisfaction at this time.  Continue per current plan of care.    Pain Pain Assessment Pain Scale: 0-10 Pain Score: 0-No pain  Therapy/Group: Individual Therapy  Ryan Day, Selinda Orion 07/09/2020, 12:34 PM

## 2020-07-09 NOTE — Plan of Care (Signed)
  Problem: Consults Goal: RH SPINAL CORD INJURY PATIENT EDUCATION Description: Patient and family will gain knowledge of disease management, pain management, bowel and bladder management, and skin and wound care during this admission. Outcome: Progressing Goal: Skin Care Protocol Initiated - if Braden Score 18 or less Description: If consults are not indicated, leave blank or document N/A Outcome: Progressing Goal: Nutrition Consult-if indicated Outcome: Progressing Goal: Diabetes Guidelines if Diabetic/Glucose > 140 Description: If diabetic or lab glucose is > 140 mg/dl - Initiate Diabetes/Hyperglycemia Guidelines & Document Interventions  Outcome: Progressing   Problem: SCI BOWEL ELIMINATION Goal: RH STG MANAGE BOWEL WITH ASSISTANCE Description: STG Manage Bowel with mod to max Assistance. Outcome: Progressing Goal: RH STG SCI MANAGE BOWEL WITH MEDICATION WITH ASSISTANCE Description: STG SCI Manage bowel with medication with mod to max assistance. Outcome: Progressing Goal: RH STG SCI MANAGE BOWEL PROGRAM W/ASSIST OR AS APPROPRIATE Description: STG SCI Manage bowel program mod to max /assist or as appropriate. Outcome: Progressing   Problem: SCI BLADDER ELIMINATION Goal: RH STG MANAGE BLADDER WITH EQUIPMENT WITH ASSISTANCE Description: STG Manage Bladder With Equipment With mod to max Assistance Outcome: Progressing Goal: RH STG SCI MANAGE BLADDER PROGRAM W/ASSISTANCE Description: mod to max assist Outcome: Progressing   Problem: RH SKIN INTEGRITY Goal: RH STG SKIN FREE OF INFECTION/BREAKDOWN Description: mod to max assist Outcome: Progressing Goal: RH STG ABLE TO PERFORM INCISION/WOUND CARE W/ASSISTANCE Description: STG Able To Perform Incision/Wound Care With mod to max  Assistance. Outcome: Progressing   Problem: RH SAFETY Goal: RH STG ADHERE TO SAFETY PRECAUTIONS W/ASSISTANCE/DEVICE Description: STG Adhere to Safety Precautions With mod to max  Assistance/Device. Outcome: Progressing Goal: RH STG DECREASED RISK OF FALL WITH ASSISTANCE Description: STG Decreased Risk of Fall With mod to max Assistance. Outcome: Progressing   Problem: RH PAIN MANAGEMENT Goal: RH STG PAIN MANAGED AT OR BELOW PT'S PAIN GOAL Description: <4 on a 0-10 pain scale Outcome: Progressing   Problem: RH KNOWLEDGE DEFICIT SCI Goal: RH STG INCREASE KNOWLEDGE OF SELF CARE AFTER SCI Description: mod to max assist Outcome: Progressing

## 2020-07-09 NOTE — Progress Notes (Addendum)
Retracted foreskin still noted. Persistent swelling to posterior area Area elevated while in bed. Ointment applied to breakdown area at tip of penis. Foley tubing manipulated so as to take pressure off the site. Patient with diminished lung sounds. Patient was able to cough and use oral suction. Quad cough done x2. Small-medium amount of white/clear sputum noted. Oxygen saturation between 95-99% RA. No tracheal suctioning required this shift.   Bowel Program done this evening. Dig stim done x2. Patient had medium amount of loose stool. Patient cleaned and repositioned. Suctioning and call device left within reach.

## 2020-07-09 NOTE — Progress Notes (Signed)
Physical Therapy Session Note  Patient Details  Name: Ryan Day MRN: [id number] Date of Birth: Oct 26, 1950  Today's Date: 07/09/2020 PT Individual Time: 0177-9390 PT Individual Time Calculation (min): 58 min   Short Term Goals: Week 1:  PT Short Term Goal 1 (Week 1): Pt will begin to instruct caregiver/staff in bed mobility and transfers. PT Short Term Goal 1 - Progress (Week 1): Progressing toward goal PT Short Term Goal 2 (Week 1): Pt will increase sitting tolerance on edge of bed to 30 minutes assist with ADL/mobility PT Short Term Goal 2 - Progress (Week 1): Progressing toward goal PT Short Term Goal 3 (Week 1): Pt will increase sitting balance on edge of bed to mod to max A to assist with ADLs/mobility PT Short Term Goal 3 - Progress (Week 1): Progressing toward goal PT Short Term Goal 4 (Week 1): Pt will propel power w/c on level surfaces with S and verbal cues. PT Short Term Goal 4 - Progress (Week 1): Other (comment) (working towards obtaining proper equipment) Week 2:  PT Short Term Goal 1 (Week 2): Pt will begin instructing caregivers/staff in assisting him with bed mobility and transfers PT Short Term Goal 2 (Week 2): Pt will recall pressure relief schedule of every 30 min x 2 min PT Short Term Goal 3 (Week 2): Pt will initiate power w/c mobility  Skilled Therapeutic Interventions/Progress Updates:   Received pt supine in bed with wife and daughter present at bedside for family eduction training, pt agreeable to therapy, and denied any pain during session. [name] initially hesitant regarding practicing hands on training with transfers but with encouragement from therapist was agreeable. Session with emphasis on family education, functional mobility/transfers, and improved activity tolerance. Ted hose, ace wraps, and abdominal binder donned throughout session. C collar donned supine in bed dependently and therapist educated pt's wife on how to don collar correctly. Pt rolled L and  R dependently with +2 assist from pt's wife to place Maximove sling. Therapist educated pt's wife on safe rolling technique, body mechanics and positioning, and to avoid pulling on pt's arms when rolling. BP semi-reclined in bed 125/67 and pt asymptomatic. Pt transferred bed<>power WC dependently via Maximove with +2 assist from pt's wife. Educated pt's wife on use of Maximove and similarities and differences between [place] and [place] lift that they will most likely use at discharge. Educated pt's wife and daughter on positioning of power WC for transfers and use of recline/tilt feature to ensure pt's hips at the back of the chair. Pt required +2 assist from pt's wife and therapist to reposition in chair and gait belt donned to bilateral LE's to prevent excessive hip abduction. Pt insisted on leaving sling in power WC stating "I'm only going to stay up for an hour". Therapist educated pt/pt's family on purpose of removing sling to prevent sheering and skin breakdown but pt ultimately requested to leave it on. Educated pt's family on use of tilt/recline features of power WC and how to manually move the chair using the controls located on the back of the chair. Due to time constraints pt unable to practice power WC mobility during this session. BP sitting in power WC 87/57 and pt reporting mild symptoms of dizziness. Tilted power WC back more and pt reported relief in symptoms. Concluded session with pt semi-reclined in power WC, soft call light clipped to R shoulder, suction within reach, and seatbelt on. Therapist assisted with positioning UE's on pillows for comfort/support. Respiratory therapy and pt's  family present at bedside. RN aware of pt's current status.   Therapy Documentation Precautions:  Precautions Precautions: Cervical Precaution Booklet Issued: No Precaution Comments: C collar on when egde of bed, upright or OOB Required Braces or Orthoses: Cervical Brace Cervical Brace: Hard collar, Other  (comment) (C collar when edge of bed, upright or OOB) Restrictions Weight Bearing Restrictions: No  Therapy/Group: Individual Therapy Alfonse Alpers PT, DPT   07/09/2020, 7:23 AM

## 2020-07-09 NOTE — Progress Notes (Signed)
RT was called to patient room around 0650 due to decreased SPO2 and missing cap for trach. Trach cap was located. RN had suctioned patient and got a moderate amount of secretions back. RT placed patient on 2L [place], to allow time to recover. 0740 patient was removed from [place], SPO2 was 97%. BBS diminished. Patient has a weak congested cough. RT will continue to monitor.

## 2020-07-09 NOTE — Progress Notes (Signed)
At 0600, 02 sats.88-90%. Finger probe changed, inner cannula changed, quad coughed, repositioned and deep suctioned. RT paged to assist with patient. Made oncoming RN aware. Patrici Ranks A

## 2020-07-09 NOTE — Progress Notes (Signed)
1 loose incontinent BM at start of shift. Foley patent. Continues with penile edema, unable to return foreskin to correct position. Agreed to turn 2 times thus far this shift. Bilateral PRAFO boots in place and wrist splints. Able to use [place], if placed close to mouth. Quad cough performed. No deep suction needed at this time. Capped trach. 02 sats 95-99%.Ryan Day A

## 2020-07-09 NOTE — Progress Notes (Signed)
Occupational Therapy Session Note  Patient Details  Name: Ryan Day MRN: [id number] Date of Birth: 1951/01/21  Today's Date: 07/09/2020 OT Individual Time: 7159-5396 OT Individual Time Calculation (min): 49 min   Skilled Therapeutic Interventions/Progress Updates:    Pt greeted in bed with no c/o pain. Spouse [name] present for family education. Started with education regarding autonomic dysreflexia with handout provided. Discussed AD, signs, causes, preventive actions, and actions to take if he does show s/s AD at home. Transitioned to education regarding compression garments for BP mgt. Discussed and demonstrated to [facility] 2 adaptive strategies for donning thigh high Teds. Education provided regarding technique for donning ACE wraps with [name] assisting therapist with keeping pts LEs elevated at this time, verbalizing technique aloud with therapist. Hands on education with bed mobility when checking brief for cleanliness and also when donning abdominal binder. Discussed his bilateral sublux and joint protection strategies to implement during mobility and positioning at rest. Spouse very appreciative of education. Left pt with sip and puff call bell within reach, suction within reach.   Therapy Documentation Precautions:  Precautions Precautions: Cervical Precaution Booklet Issued: No Precaution Comments: C collar on when egde of bed, upright or OOB Required Braces or Orthoses: Cervical Brace Cervical Brace: Hard collar, Other (comment) (C collar when edge of bed, upright or OOB) Restrictions Weight Bearing Restrictions: No Vital Signs: Therapy Vitals Pulse Rate: 77 Resp: 18 Patient Position (if appropriate): Sitting Oxygen Therapy SpO2: 95 % O2 Device: Room Air ADL: ADL Eating: NPO Grooming: Dependent Upper Body Bathing: Dependent Lower Body Bathing: Dependent Upper Body Dressing: Dependent Where Assessed-Upper Body Dressing: Bed level Lower Body Dressing: Dependent Where  Assessed-Lower Body Dressing: Bed level Toileting: Dependent Where Assessed-Toileting: Bed level      Therapy/Group: Individual Therapy  Valari Taylor A Damany Eastman 07/09/2020, 12:43 PM

## 2020-07-10 ENCOUNTER — Inpatient Hospital Stay (HOSPITAL_COMMUNITY): Payer: BC Managed Care – PPO

## 2020-07-10 ENCOUNTER — Inpatient Hospital Stay (HOSPITAL_COMMUNITY): Payer: BC Managed Care – PPO | Admitting: Physical Therapy

## 2020-07-10 ENCOUNTER — Encounter (HOSPITAL_COMMUNITY): Payer: BC Managed Care – PPO

## 2020-07-10 ENCOUNTER — Ambulatory Visit (HOSPITAL_COMMUNITY): Payer: BC Managed Care – PPO | Admitting: Physical Therapy

## 2020-07-10 DIAGNOSIS — S14109S Unspecified injury at unspecified level of cervical spinal cord, sequela: Secondary | ICD-10-CM | POA: Diagnosis not present

## 2020-07-10 LAB — CBC
HCT: 25 % — ABNORMAL LOW (ref 39.0–52.0)
Hemoglobin: 8 g/dL — ABNORMAL LOW (ref 13.0–17.0)
MCH: 28.5 pg (ref 26.0–34.0)
MCHC: 32 g/dL (ref 30.0–36.0)
MCV: 89 fL (ref 80.0–100.0)
Platelets: 338 10*3/uL (ref 150–400)
RBC: 2.81 MIL/uL — ABNORMAL LOW (ref 4.22–5.81)
RDW: 15.8 % — ABNORMAL HIGH (ref 11.5–15.5)
WBC: 9.3 10*3/uL (ref 4.0–10.5)
nRBC: 0.3 % — ABNORMAL HIGH (ref 0.0–0.2)

## 2020-07-10 LAB — GLUCOSE, CAPILLARY
Glucose-Capillary: 93 mg/dL (ref 70–99)
Glucose-Capillary: 129 mg/dL — ABNORMAL HIGH (ref 70–99)

## 2020-07-10 LAB — BASIC METABOLIC PANEL
Anion gap: 9 (ref 5–15)
BUN: 29 mg/dL — ABNORMAL HIGH (ref 8–23)
CO2: 27 mmol/L (ref 22–32)
Calcium: 9.1 mg/dL (ref 8.9–10.3)
Chloride: 103 mmol/L (ref 98–111)
Creatinine, Ser: 0.72 mg/dL (ref 0.61–1.24)
GFR, Estimated: >60 mL/min (ref >60)
Glucose, Bld: 115 mg/dL — ABNORMAL HIGH (ref 70–99)
Potassium: 4.1 mmol/L (ref 3.5–5.1)
Sodium: 139 mmol/L (ref 135–145)

## 2020-07-10 MED ORDER — BACLOFEN 5 MG HALF TABLET
5.0000 mg | ORAL_TABLET | Freq: Three times a day (TID) | ORAL | Status: DC
  Administered 2020-07-10 – 2020-07-20 (×30): 5 mg
  Filled 2020-07-10 (×31): qty 1

## 2020-07-10 MED ORDER — FREE WATER
400.0000 mL | Status: DC
  Administered 2020-07-10 – 2020-07-18 (×47): 400 mL

## 2020-07-10 NOTE — NC FL2 (Signed)
[place] LEVEL OF CARE SCREENING TOOL     IDENTIFICATION  Patient Name: Aleric Froelich Birthdate: 05-16-1951 Sex: male Admission Date (Current Location): 06/23/2020  [facility] and [place] Number:  Herbalist and Address:  [facility], [place] [address] [place],  [address]      Provider Number: [id number]  Attending Physician Name and Address:  Courtney Heys, MD  Relative Name and Phone Number:  Felisa Bonier (wife): [phone]    Current Level of Care: Hospital Recommended Level of Care: [place] Prior Approval Number:    Date Approved/Denied:   PASRR Number:    Discharge Plan: SNF    Current Diagnoses: Patient Active Problem List   Diagnosis Date Noted  . Spinal cord injury, cervical region, sequela ([place]) 06/23/2020  . Pressure injury of skin 06/23/2020  . S/P percutaneous endoscopic gastrostomy (PEG) tube placement ([place])   . Tracheostomy in place [facility])   . Quadriplegia ([place])   . Neurogenic orthostatic hypotension ([place])   . Acute on chronic anemia   . Hypernatremia   . Neurogenic bladder   . Neurogenic bowel   . Acute on chronic respiratory failure with hypoxia ([place])   . Autonomic instability   . Cardiac arrest ([place])   . Cervical spinal cord injury, sequela ([place])   . Acute renal injury due to hypovolemia ([place])   . Family history of stomach cancer   . Special screening for malignant neoplasms, colon   . Exercise-induced leg cramps 05/13/2017  . Elevated alkaline phosphatase level 03/13/2015  . Hyperlipidemia   . Hypertension     Orientation RESPIRATION BLADDER Height & Weight     Self, Time, Situation, Place  Tracheostomy, Other (Comment) (PT has trach but currently capped. Continues to require suction due to secretions. Trach collar since 8/12.) Indwelling catheter Weight: 231 lb 14.8 oz (105.2 kg) Height:  6\' 5"  (195.6 cm)  BEHAVIORAL SYMPTOMS/MOOD NEUROLOGICAL BOWEL NUTRITION STATUS       Incontinent Feeding tube (G tube: Osmolite 1.5)  AMBULATORY STATUS COMMUNICATION OF NEEDS Skin   Total Care Verbally Other (Comment) (Sacral wound)                       Personal Care Assistance Level of Assistance  Total care       Total Care Assistance: Maximum assistance   Functional Limitations Info             SPECIAL CARE FACTORS FREQUENCY  PT (By licensed PT), OT (By licensed OT), Speech therapy     PT Frequency: 5xs per week OT Frequency: 5xs per week     Speech Therapy Frequency: 5xs per week      Contractures Contractures Info: Not present    Additional Factors Info  Code Status, Allergies Code Status Info: Full Allergies Info: NKA           Current Medications (07/10/2020):  This is the current hospital active medication list Current Facility-Administered Medications  Medication Dose Route Frequency Provider Last Rate Last Admin  . acetaminophen (TYLENOL) 160 MG/5ML solution 320-640 mg  320-640 mg Per Tube Q4H PRN Lovorn, Megan, MD   640 mg at 07/04/20 1407  . alum & mag hydroxide-simeth (MAALOX/MYLANTA) 200-200-20 MG/5ML suspension 30 mL  30 mL Per Tube Q4H PRN Lovorn, Megan, MD   30 mL at 07/02/20 1210  . ascorbic acid (VITAMIN C) tablet 500 mg  500 mg Per Tube BID Bary Leriche, PA-C  500 mg at 07/10/20 0920  . baclofen (LIORESAL) tablet 5 mg  5 mg Per Tube TID Lovorn, Megan, MD      . bisacodyl (DULCOLAX) suppository 10 mg  10 mg Rectal QHS Lovorn, Megan, MD   10 mg at 07/09/20 1735  . chlorhexidine (PERIDEX) 0.12 % solution 15 mL  15 mL Mouth Rinse BID Bary Leriche, PA-C   15 mL at 07/10/20 0920  . Chlorhexidine Gluconate Cloth 2 % PADS 6 each  6 each Topical BID Raulkar, Clide Deutscher, MD   6 each at 07/10/20 0314  . collagenase (SANTYL) ointment   Topical Daily Courtney Heys, MD   Given at 07/09/20 0802  . diphenhydrAMINE (BENADRYL) 12.5 MG/5ML elixir 12.5-25 mg  12.5-25 mg Per Tube Q6H PRN Lovorn, Megan, MD      . enoxaparin (LOVENOX)  injection 40 mg  40 mg Subcutaneous Q24H Love, Pamela S, PA-C   40 mg at 07/09/20 2002  . famotidine (PEPCID) tablet 20 mg  20 mg Per Tube Daily Bary Leriche, PA-C   20 mg at 07/10/20 7829  . feeding supplement (OSMOLITE 1.5 CAL) liquid 474 mL  474 mL Per Tube QID Lovorn, Megan, MD   474 mL at 07/10/20 0930  . feeding supplement (PROSource TF) liquid 45 mL  45 mL Per Tube BID Lovorn, Megan, MD   45 mL at 07/10/20 0921  . fludrocortisone (FLORINEF) 0.1mg /mL oral suspension 0.2 mg  0.2 mg Per Tube Daily Lovorn, Megan, MD   0.2 mg at 07/10/20 0927  . FLUoxetine (PROZAC) 20 MG/5ML solution 40 mg  40 mg Per Tube Daily Bary Leriche, PA-C   40 mg at 07/10/20 5621  . free water 400 mL  400 mL Per Tube Q2H Pearson Grippe B, MD   400 mL at 07/10/20 1144  . guaiFENesin tablet 400 mg  400 mg Per Tube Q6H LoveIvan Anchors, PA-C   400 mg at 07/10/20 0511  . guaiFENesin-dextromethorphan (ROBITUSSIN DM) 100-10 MG/5ML syrup 5-10 mL  5-10 mL Per Tube Q6H PRN Lovorn, Megan, MD      . insulin aspart (novoLOG) injection 0-9 Units  0-9 Units Subcutaneous BID WC Lovorn, Megan, MD   3 Units at 07/09/20 2002  . lidocaine (LIDODERM) 5 % 1 patch  1 patch Transdermal Q24H Bary Leriche, PA-C   1 patch at 07/10/20 3086  . MEDLINE mouth rinse  15 mL Mouth Rinse q12n4p Bary Leriche, PA-C   15 mL at 07/09/20 1637  . midodrine (PROAMATINE) tablet 10 mg  10 mg Per Tube TID WC Lovorn, Megan, MD   10 mg at 07/10/20 0923  . multivitamin liquid 15 mL  15 mL Per Tube Daily Bary Leriche, PA-C   15 mL at 07/10/20 0920  . neomycin-bacitracin-polymyxin (NEOSPORIN) ointment   Topical BID Lovorn, Jinny Blossom, MD   Given at 07/10/20 0930  . nutrition supplement (JUVEN) (JUVEN) powder packet 1 packet  1 packet Per Tube BID BM Love, Ivan Anchors, PA-C   1 packet at 07/10/20 0930  . ondansetron (ZOFRAN) tablet 4 mg  4 mg Per Tube Q8H PRN Lovorn, Megan, MD      . polyethylene glycol (MIRALAX / GLYCOLAX) packet 17 g  17 g Per Tube Daily PRN Lovorn,  Megan, MD      . prochlorperazine (COMPAZINE) tablet 5-10 mg  5-10 mg Oral Q6H PRN Love, Pamela S, PA-C       Or  . prochlorperazine (COMPAZINE) injection 5-10 mg  5-10 mg Intramuscular Q6H PRN Love, Pamela S, PA-C       Or  . prochlorperazine (COMPAZINE) suppository 12.5 mg  12.5 mg Rectal Q6H PRN Love, Pamela S, PA-C      . saccharomyces boulardii (FLORASTOR) capsule 250 mg  250 mg Per Tube BID Bary Leriche, PA-C   250 mg at 07/10/20 3419  . scopolamine (TRANSDERM-SCOP) 1 MG/3DAYS 1.5 mg  1 patch Transdermal Q72H Love, Ivan Anchors, PA-C   1.5 mg at 07/08/20 2047  . sennosides (SENOKOT) 8.8 MG/5ML syrup 5 mL  5 mL Per Tube Q0600 Bary Leriche, PA-C   5 mL at 07/07/20 0553  . sodium phosphate (FLEET) 7-19 GM/118ML enema 1 enema  1 enema Rectal Once PRN Love, Pamela S, PA-C      . traZODone (DESYREL) tablet 50 mg  50 mg Per Tube QHS Bary Leriche, PA-C   50 mg at 07/09/20 2057  . zinc sulfate capsule 220 mg  220 mg Per Tube Daily Bary Leriche, PA-C   220 mg at 07/10/20 3790     Discharge Medications: Please see discharge summary for a list of discharge medications.  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  Pressure Injury POA: Yes Measurement: Healing stage 3: 4 cm x 5.2 cm x 0.5 cm, red and moist, extends across bilateral buttocks and sacrum Unstagable:3.2 cm x 2 cm black in the upper left area. Drainage (amount, consistency, odor)None Dressing procedure/placement/frequency:Apply to yellow and brown slough on the left buttocks. Cover all wound areas with a saline moistened gauze and cover with foam dressing. Change daily Continue current orders in from [facility] for the penis: Apply to tip of penis around pressure injury from foley- tip of meatus.  Tube Feeds- Continue bolus tube feeds via PEG: - 474 ml (2 cartons)  Osmolite 1.5 cal formula QID - ProSource TF 45 ml BID - Per Nephrology, free water flushes of 400 ml q 2 hours (continue for another 24 hours then adjust for target sodium of 140) Tube feeding regimen provides 2920 kcal, 142 grams of protein, and 1448 ml of H2O. Total free water with current flushes: 6248 ml  Rana Snare, LCSW

## 2020-07-10 NOTE — Progress Notes (Signed)
Occupational Therapy Session Note  Patient Details  Name: Ryan Day MRN: [id number] Date of Birth: [birth year]  Today's Date: 07/10/2020 OT Individual Time: 1330-1430 OT Individual Time Calculation (min): 60 min    Short Term Goals: Week 2:  OT Short Term Goal 1 (Week 2): Pt will maintain static sitting balnace wiht MAX A of 1 caregiver for 3 min at EOB/EOM OT Short Term Goal 1 - Progress (Week 2): Progressing toward goal OT Short Term Goal 2 (Week 2): Pt will direct functional transfers with max VC OT Short Term Goal 2 - Progress (Week 2): Progressing toward goal OT Short Term Goal 3 (Week 2): Pt will roll B in bed with MOD +2 A OT Short Term Goal 3 - Progress (Week 2): Progressing toward goal Week 3:  OT Short Term Goal 1 (Week 3): Pt will roll B in bed with MOD +2 A OT Short Term Goal 2 (Week 3): Pt will direct functional transfers with max VC OT Short Term Goal 3 (Week 3): Pt will maintain static sitting balnace wiht MAX A of 1 caregiver for 3 min at EOB/EOM  Skilled Therapeutic Interventions/Progress Updates:    Pt resting in PWC upon arrival.  OT intervention with focus on bed mobility, activity tolerance, directing care, and activity tolerance. Pt requested to be returned to bed. +2 for placement of MaxiMove sling and transfer to bed. Pt incontinent of bowel and pt dependent for hygiene and placement of clean brief. RN attended to redress wound on buttocks. Pt independent with stating needs re head placement and arm placement. Pt tot A for directing care during transfer and repositioning in bed.  Pt directed placement of puff call bell and suction. Pt remained in bed with all needs within reach.  Therapy Documentation Precautions:  Precautions Precautions: Cervical Precaution Booklet Issued: No Precaution Comments: C collar on when egde of bed, upright or OOB Required Braces or Orthoses: Cervical Brace Cervical Brace: Hard collar, Other (comment) (C collar when edge of bed,  upright or OOB) Restrictions Weight Bearing Restrictions: Yes Pain: Pain Assessment Pain Score: 0-No pain   Therapy/Group: Individual Therapy  [name] 07/10/2020, 2:35 PM

## 2020-07-10 NOTE — Progress Notes (Signed)
Chart/nephrology notes reviewed--last of 10/13 recommended decreasing fluid flushes after 24 hours with target Na-140. Will decrease flushes to every 4 hours and continue to monitor Na every MWF. Question drop in H/H likely due to hemodilution as BUN has improved from 55-->29 with hydration.

## 2020-07-10 NOTE — Progress Notes (Signed)
[place] PHYSICAL MEDICINE & REHABILITATION PROGRESS NOTE   Subjective/Complaints: No complaints this morning. Denies pain, constipation, insomnia.  Na normal Hgb dropped to 8-  Repeat tomorrow given large drop.   ROS:   Pt denies SOB, abd pain, CP, - N/V, + neurogenic bowel and bladder no BM without bowel program, has foley    Objective:   DG CHEST PORT 1 VIEW  Result Date: 07/09/2020 CLINICAL DATA:  Cough. EXAM: PORTABLE CHEST 1 VIEW COMPARISON:  06/19/2020 chest radiograph and prior. FINDINGS: Indwelling tracheostomy tube overlies the midline air column. Medial right basilar patchy opacities. No pneumothorax or pleural effusion. Mild cardiomegaly. Osseous structures are unchanged. IMPRESSION: Patchy medial right basilar opacities. Differential includes infection, aspiration and atelectasis. Electronically Signed   By: Primitivo Gauze M.D.   On: 07/09/2020 09:54   Recent Labs    07/10/20 0645  WBC 9.3  HGB 8.0*  HCT 25.0*  PLT 338   Recent Labs    07/07/20 1533 07/10/20 0645  NA 140 139  K 3.8 4.1  CL 102 103  CO2 28 27  GLUCOSE 181* 115*  BUN 36* 29*  CREATININE 0.72 0.72  CALCIUM 9.3 9.1    Intake/Output Summary (Last 24 hours) at 07/10/2020 1013 Last data filed at 07/10/2020 1009 Gross per 24 hour  Intake --  Output 3025 ml  Net -3025 ml     Pressure Injury 06/23/20 Buttocks Right Stage 3 -  Full thickness tissue loss. Subcutaneous fat may be visible but bone, tendon or muscle are NOT exposed. healing stage 3 (Active)  06/23/20 1838  Location: Buttocks  Location Orientation: Right  Staging: Stage 3 -  Full thickness tissue loss. Subcutaneous fat may be visible but bone, tendon or muscle are NOT exposed.  Wound Description (Comments): healing stage 3  Present on Admission: Yes     Pressure Injury 06/23/20 Left Stage 3 -  Full thickness tissue loss. Subcutaneous fat may be visible but bone, tendon or muscle are NOT exposed. healing stage 3 (Active)   06/23/20 1839  Location:   Location Orientation: Left  Staging: Stage 3 -  Full thickness tissue loss. Subcutaneous fat may be visible but bone, tendon or muscle are NOT exposed.  Wound Description (Comments): healing stage 3  Present on Admission: Yes     Pressure Injury 06/30/20 Penis Medial;Posterior Stage 2 -  Partial thickness loss of dermis presenting as a shallow open injury with a red, pink wound bed without slough. catheter sized indention and opening of skin  of urethral meatus on posterior side  (Active)  06/30/20 1845  Location: Penis  Location Orientation: Medial;Posterior  Staging: Stage 2 -  Partial thickness loss of dermis presenting as a shallow open injury with a red, pink wound bed without slough.  Wound Description (Comments): catheter sized indention and opening of skin  of urethral meatus on posterior side of the urethral meatus  Present on Admission:     Physical Exam: Vital Signs Blood pressure (!) 110/55, pulse 80, temperature 97.7 F (36.5 C), temperature source Oral, resp. rate 20, height 6\' 5"  (1.956 m), weight 105.2 kg, SpO2 96 %. .  General: Alert, No apparent distress HEENT: Head is normocephalic, atraumatic, PERRLA, EOMI, sclera anicteric, oral mucosa pink and moist, dentition intact, ext ear canals clear,  Neck: Supple without JVD or lymphadenopathy Heart: Reg rate and rhythm. No murmurs rubs or gallops Chest: CTA bilaterally without wheezes, rales, or rhonchi; no distress Abdomen: Soft, non-tender, non-distended, bowel sounds positive. Extremities:  No clubbing, cyanosis, or edema. Pulses are 2+  Skin: PEG site with duoderm dressing, some crusting around tube   Skin: Stage II/deep or superficial Stage III- more slough is resolved (not all of it)- bright pink/granulating overall-    GU- foreskin retracted but reduceable, no tenderness, meatus without blood or d/c Able to reduce but then retracts again  Ox3- quiet Motor: B/l UE shoulder  abduction 1+/5, distally 0/5 with ?trace flicker left wrist flexor B/l LE: 0/5 proximal to distal Sensation diminished to light touch in all extremities      Assessment/Plan: 1. 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  Physiatrist is providing close team supervision and 24 hour management of active medical problems listed below.  Physiatrist and rehab team continue to assess barriers to discharge/monitor patient progress toward functional and medical goals  Care Tool:  Bathing              Bathing assist Assist Level: 2 Helpers     Upper Body Dressing/Undressing Upper body dressing   What is the patient wearing?: Pull over shirt    Upper body assist Assist Level: 2 Helpers    Lower Body Dressing/Undressing Lower body dressing      What is the patient wearing?: Incontinence brief, Pants     Lower body assist Assist for lower body dressing: 2 Helpers     Toileting Toileting    Toileting assist Assist for toileting: Dependent - Patient 0%     Transfers Chair/bed transfer  Transfers assist  Chair/bed transfer activity did not occur: Safety/medical concerns  Chair/bed transfer assist level: Dependent - mechanical lift     Locomotion Ambulation   Ambulation assist   Ambulation activity did not occur: Safety/medical concerns          Walk 10 feet activity   Assist           Walk 50 feet activity   Assist           Walk 150 feet activity   Assist           Walk 10 feet on uneven surface  activity   Assist           Wheelchair     Assist Will patient use wheelchair at discharge?: Yes Type of Wheelchair: Power Wheelchair activity did not occur: Safety/medical concerns         Wheelchair 50 feet with 2 turns activity    Assist    Wheelchair 50 feet with 2 turns activity did not occur:  Safety/medical concerns       Wheelchair 150 feet activity     Assist  Wheelchair 150 feet activity did not occur: Safety/medical concerns       Blood pressure (!) 110/55, pulse 80, temperature 97.7 F (36.5 C), temperature source Oral, resp. rate 20, height 6\' 5"  (1.956 m), weight 105.2 kg, SpO2 96 %.    Medical Problem List and Plan: 1.  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  Antithrombotics: -DVT/anticoagulation:  Pharmaceutical: Lovenox--check dopplers in am.  10/5- Dopplers (-)  10/10- needs for 3 months from starting it             -  antiplatelet therapy: N/A                                                                                                                                                                                                                                                                                                                                                                                                                                                             3. Pain Management: N/A 4. Mood: LCSW to follow for evaluation and support.              -antipsychotic agents: N/A   Neuropsych following  5. Neuropsych: This patient is capable of making decisions on his own behalf. 6. Sacral decub/Skin/Wound Care: Pictures show wound in different stages of healing. Ordered Air mattress overlay. Continue to reposition q2H. Continue vitamin and prostat to promote healing. Will add Juven also for collagen. Will increase tube feed to 50 cc/hr as likely has higher nutritional need. Will consult dietician to help address nutritional needs.   10/4- will consult dietician- BUN up to 54 from 27 and Cr 1.19 from <1- asked to evaluate dryness  10/5- haven't seen a dietician note- will check on this.  10/9- less slough, so will  ask WOC to see if can change to silver alginate, or something like  that.  Also ordered neosporin for new wound around Urethral meatus  10/10- waiting for wound care to see- since weekend.   10/11: appreciate WOC eval and recs  7. Fluids/Electrolytes/Nutrition: Monitor I/Os.CMP ordered. Transition to bolus tube feeds next week if stable.   Hypernatremia dn pre rennal azotemia- nephro  8. Orthostatic Hypotension: Secondary to autonomic dysfunction has resolved. BP soft to 110/55.               Vitals:   07/10/20 0506 07/10/20 0917  BP: (!) 110/55   Pulse: 81 80  Resp: 20 20  Temp: 97.7 F (36.5 C)   SpO2: 98% 96%   9. Acute on chronic anemia: Improving--continue to monitor. Added iron supplement.              Hgb stable 10/11  10/18: dropped to 8- repeat tomorrow with iron level 10. Hypernatremia/: Added water flushes. May need low sodium formula.   10/4- Na 144- doing OK  10/9- Na 151- rechecking in AM  10/11: 156- nephro consulted Responding to free H20 11. Neurogenic bladder: Maintain foley due to sacral wounds.. 12. Neurogenic bowel:Continue senna in am with suppository in evenings for bowel program. Moving bowels regularly   10/1-0 having small BM last night with program 13. Secretions/Trach/capped: continue scopolamine patch  10/15- con't trach, because still needing suctioning- pt refused today, but sounds like he needs it.  Cough with clear sputum, had desat requiring O2 this am which has resolved no CP or SOB will check portable xray  No signs of pum edema, ? RLL infiltrate but looks minimally changed since 9/27 14. Azotemia/NPO- getting TFs  Prior- Na got up to 156 and BUN up to 58- but BUN was 42 and Na down to 147 as of 10/14  10/15- Labs pending- will assess when they come up.   15. W/C requirement- will need either head array OR chin control power w/c- have already discussed with w/c rep/company- they are working on getting a demo for him.   16. Hypokalemia  10/14-  will give repletion of KCL today 40 mEq x1.    10/15- labs pending 17. Spasticity  10/15- pt asking for spasticity meds- for annoying spasms- will try Baclofen 5 mg TID and see how he does.   18.  Retracted foreskin with chronic foley, no sign of ischemia, edema posterior foreskin, may be related to increased peripheral edema although no scrotal swelling - discussed with Uro on call Dr Sheppard Coil who felt that Uro eval was not needed at this time  LOS: 17 days A FACE TO FACE EVALUATION WAS Carpenter 07/10/2020, 10:13 AM

## 2020-07-10 NOTE — Progress Notes (Signed)
Patient ID: Ryan Day, male   DOB: 01/11/1951, 69 y.o.   MRN: [id number]  SW spoke with [name]/[facility] [phone]) to follow-up about insurance. Reports [place] listed as primary, and Medicare Part A is not currently active due to [employer] as active.  SW spoke with Sally/[facility] Intake Dept [phone]) to discuss if [facility] can service pt. Reports neither branch is able to provide care due to staffing.   SW sent referral again to Britney/[facility] to see if any changes with [employer] and if branch can accept referral.  *referral declined as [facility] branch continues to have a hold on referrals in this area.   SW faxed HHPT/OT/SLP/SN (trach/wound care)/SW/aide to Rex/[facility] Intake Dept [phone]). SW waiting on follow-up.  SW faxed HHPT/OT/SLP/SN (trach/wound care)/SW to [facility]/Intake Dept [phone]). SW waiting on follow-up.  *SW spoke with [facility] about referral; declined due to limited staffing in area and staffing.   SW sent out SNF referral due to continued declined HHAs. Waiting on NCPASSR#. *Pt escalated to Level II PASSR. Pt will require state mental health assessment.   Declined HHA: [facility]-insurance [facility] [employer] and care needs [facility]- insurance/out of network [facility]-not in network; 20-30% co pay [place]- insurance not in network [facility]- due to staffing [place]- due to insurance [place]- does not service area [place], MSW, [place] Office: [phone] Cell: [phone] Fax: [phone]

## 2020-07-10 NOTE — Progress Notes (Signed)
Bowel program started at end of day shift. Writer performed digital stimulation for 8 minutes until just gas being passed. Pt was cleaned up of medium soft mushy brownish yellow stool. Digital stimulation performed after cleaning patient up for another five minutes for just a small amount of mushy stool. Pt given night bath afterward. Tolerated well.

## 2020-07-10 NOTE — Progress Notes (Signed)
Physical Therapy Weekly Progress Note  Patient Details  Name: Ryan Day MRN: [id number] Date of Birth: [birth year]  Beginning of progress report period: July 03, 2020 End of progress report period: July 10, 2020  Today's Date: 07/10/2020 PT Individual Time: 3817-7116; 5790-3833 PT Individual Time Calculation (min): 45 min and 55 min  Patient has met 1 of 3 short term goals.  Pt is making very slow progress towards therapy goals. Pt currently requires assist x 2 for all bed mobility and is dependent for transfers via use of a mechanical lift. Pt has been set up with a PWC has been able to able to trial use of head array controls for PWC control. Pt will require further practice for safe and independent mobility at the [facility] level. [name] has also been able to initiate hands-on family education with regards to bed mobility, BP management, donning of TEDs, ACE wrap, and abdominal binder, and transfers with use of mechanical lift. [name] currently overwhelmed with amount of physical assist pt will require upon d/c home and will need continued practice and support if pt to d/c home safely.  Patient continues to demonstrate the following deficits muscle weakness, muscle joint tightness and muscle paralysis, decreased cardiorespiratoy endurance, abnormal tone and unbalanced muscle activation and decreased sitting balance, decreased postural control and decreased balance strategies and therefore will continue to benefit from skilled PT intervention to increase functional independence with mobility.  Patient progressing toward long term goals..  Continue plan of care.  PT Short Term Goals Week 2:  PT Short Term Goal 1 (Week 2): Pt will begin instructing caregivers/staff in assisting him with bed mobility and transfers PT Short Term Goal 1 - Progress (Week 2): Progressing toward goal PT Short Term Goal 2 (Week 2): Pt will recall pressure relief schedule of every 30 min x 2 min PT Short Term  Goal 2 - Progress (Week 2): Progressing toward goal PT Short Term Goal 3 (Week 2): Pt will initiate power w/c mobility PT Short Term Goal 3 - Progress (Week 2): Met Week 3:  PT Short Term Goal 1 (Week 3): =LTG due to ELOS  Skilled Therapeutic Interventions/Progress Updates:    Session 1: Pt received seated in bed handed off from OT session. [name] Ryan Day present for hands-on family education session. Pt had just donned BLE thigh-high TEDs and ACE wrap. Pt is total A x 2 for rolling R/L for donning of abdominal binder and brief. [name] then becomes emotional and incredibly tearful due to feeling incredibly overwhelmed with the amount of care the patient requires and states her concerns about being able to care for him at home. Notified nursing to see if chaplain available to come up to provide further emotional support for patient and his wife. [name] taken to family room to have time to calm down before resuming family education session. Supine BP 121/65 with TEDs, ACE wrap, and abdominal binder. Rolling L/R with assist x 2 for placement of hoyer sling. Manual hoyer transfer to [name]. Once seated in PWC pt with no complaints of dizziness, BP 84/52. Tilted pt back in w/c. Reviewed importance of removal of sling while seated up in chair for skin integrity and prevention of further wounds. Pt left in care of SLP with wife present again in room at end of session.  Session 2: Pt received seated in bed, agreeable to PT session. No complaints of pain. Semi-reclined BP 118/65 with use of TEDs and ACE wrap. Discussed current PWC  controls and setup as pt currently struggles with driving [name] due to inability to lift head up from head controls in order to stop w/c once in motion. Discussed with w/c representative regarding pt's current abilities. Representative to be present during next therapy session to observe and problem solve controls with patient and therapy. Pt agreeable to bed level PROM for BLE this  date. BLE hip, knee, and ankle PROM in available planes of motion to prevent contracture. Reviewed handout with patient and will review with family as able. Pt also appeared confused about current date and believed it to be a Wednesday, reoriented patient and provided calendar in room in order for him to better track time. Pt left seated in bed with needs in reach at end of session.  Therapy Documentation Precautions:  Precautions Precautions: Cervical Precaution Booklet Issued: No Precaution Comments: C collar on when egde of bed, upright or OOB Required Braces or Orthoses: Cervical Brace Cervical Brace: Hard collar, Other (comment) (C collar when edge of bed, upright or OOB) Restrictions Weight Bearing Restrictions: Yes   Therapy/Group: Individual Therapy   Excell Seltzer, PT, DPT  07/10/2020, 11:17 AM

## 2020-07-10 NOTE — Progress Notes (Signed)
Occupational Therapy Session Note  Patient Details  Name: Couper Juncaj MRN: [id number] Date of Birth: April 19, 1951  Today's Date: 07/10/2020 OT Individual Time: 5625-6389 OT Individual Time Calculation (min): 45 min    Short Term Goals: Week 3:  OT Short Term Goal 1 (Week 3): Pt will roll B in bed with MOD +2 A OT Short Term Goal 2 (Week 3): Pt will direct functional transfers with max VC OT Short Term Goal 3 (Week 3): Pt will maintain static sitting balnace wiht MAX A of 1 caregiver for 3 min at EOB/EOM  Skilled Therapeutic Interventions/Progress Updates:    Pt resting in bed upon arrival. BP 124/60 without ted hose, ace wraps, or abdominal binder. Respitory Therapist arrived and performed deep suction. OT interventin with focus on family education. [name] arrived after [facility] donned but was able to describe technique for donning [place]. [name] applied Ace Wraps with mod verbal cues and assistance supporting BLE. Recommended family purchase a blood pressure machine to monitor pt's BP. [name] verbalized understanding.Hand off to PT to continue education.  Therapy Documentation Precautions:  Precautions Precautions: Cervical Precaution Booklet Issued: No Precaution Comments: C collar on when egde of bed, upright or OOB Required Braces or Orthoses: Cervical Brace Cervical Brace: Hard collar, Other (comment) (C collar when edge of bed, upright or OOB) Restrictions Weight Bearing Restrictions: Yes Pain: Pain Assessment Pain Scale: 0-10 Pain Score: 0-No pain   Therapy/Group: Individual Therapy  [name] 07/10/2020, 9:49 AM

## 2020-07-10 NOTE — Progress Notes (Signed)
Speech Language Pathology Daily Session Note  Patient Details  Name: Ryan Day MRN: [id number] Date of Birth: 02/05/1951  Today's Date: 07/10/2020 SLP Individual Time: 1040-1105 SLP Individual Time Calculation (min): 25 min  Short Term Goals: Week 3: SLP Short Term Goal 1 (Week 3): Patient will achieve and maintain adequate vocal intensity during trials and use of voice-activated adaptive equipement. SLP Short Term Goal 2 (Week 3): Patient will participate in trials of voice-activated and controlled devices with SLP. SLP Short Term Goal 3 (Week 3): Patient will consume PO trials of ice chips/small controlled water sips with minimal incidence of overt s/s of aspiration or penetration. SLP Short Term Goal 4 (Week 3): Patient will perform EMST and swallow strenghening exercises with setup A and supervisionA  Skilled Therapeutic Interventions:Skilled ST services focused on education and speech skills. PT was setting up pt in remote chair and notified SLP of low BP. SLP facilitated education of swallow exercises x3, RMST and use of water protocol with [name]. Pt required mod A verbal cues to recall instructions from yesterday, but assured SLP she will post the instructions (given by previous SLP) on the wall in his him, so she can easily reference. SLP completed only EMST exercises due to time restraints and concerns over BP, although BP increased to [facility] during the session. Pt demonstrated increase vocal intensity, however characterized by increase congestive vocal quality, pt utilized suction mod I. Pt ws unable to preform EMST originally at 7 cm H20, however was able to complete x10 repetitions set at 6cm H20. SLP instructed [name] to continue EMST set at 6cm today until there is a decrease in congestive vocal quality. All questions answered to satisfaction. Education is on going and recommended. SLP scheduled repeat MBS for Wednesday 10/20. Pt was left in room with [name]. SLP recommends to continue  skilled services.  As of note SLP informed pt and [name] of scheduled MBS. SLP assisted nurse with setting up assistive call bell device.     Pain Pain Assessment Pain Scale: 0-10 Pain Score: 0-No pain  Therapy/Group: Individual Therapy  [name]  [facility] 07/10/2020, 12:36 PM

## 2020-07-10 NOTE — Progress Notes (Signed)
Slept good. Turned every 3 hours. Bilateral PRAFO's and WHO's applied at HS. Bowel program completed on previous shift. One small smear during night. Elevating penis to attempt to decrease swelling. Capped trach in place. 02 sats between 93-99%. Congested, weak and productive cough. Quad cough x 2.  Did not require deep suctioning this shift, able to cough up secretions  independently into [place] attached to bed. Ryan Day A

## 2020-07-11 ENCOUNTER — Inpatient Hospital Stay (HOSPITAL_COMMUNITY): Payer: BC Managed Care – PPO | Admitting: Physical Therapy

## 2020-07-11 ENCOUNTER — Inpatient Hospital Stay (HOSPITAL_COMMUNITY): Payer: BC Managed Care – PPO

## 2020-07-11 DIAGNOSIS — S14109S Unspecified injury at unspecified level of cervical spinal cord, sequela: Secondary | ICD-10-CM | POA: Diagnosis not present

## 2020-07-11 LAB — GLUCOSE, CAPILLARY
Glucose-Capillary: 97 mg/dL (ref 70–99)
Glucose-Capillary: 123 mg/dL — ABNORMAL HIGH (ref 70–99)

## 2020-07-11 LAB — CBC
HCT: 25.2 % — ABNORMAL LOW (ref 39.0–52.0)
Hemoglobin: 8 g/dL — ABNORMAL LOW (ref 13.0–17.0)
MCH: 28.9 pg (ref 26.0–34.0)
MCHC: 31.7 g/dL (ref 30.0–36.0)
MCV: 91 fL (ref 80.0–100.0)
Platelets: 349 10*3/uL (ref 150–400)
RBC: 2.77 MIL/uL — ABNORMAL LOW (ref 4.22–5.81)
RDW: 15.9 % — ABNORMAL HIGH (ref 11.5–15.5)
WBC: 7 10*3/uL (ref 4.0–10.5)
nRBC: 0.7 % — ABNORMAL HIGH (ref 0.0–0.2)

## 2020-07-11 LAB — IRON AND TIBC
Iron: 42 ug/dL — ABNORMAL LOW (ref 45–182)
Saturation Ratios: 18 % (ref 17.9–39.5)
TIBC: 238 ug/dL — ABNORMAL LOW (ref 250–450)
UIBC: 196 ug/dL

## 2020-07-11 IMAGING — CR DG CHEST 2V
2 series · 2 of 2 positions shown · non-contrast
Comparison: [DATE].  [DATE].

CLINICAL DATA: Congestion.

EXAM:
CHEST - 2 VIEW

[chest lat]
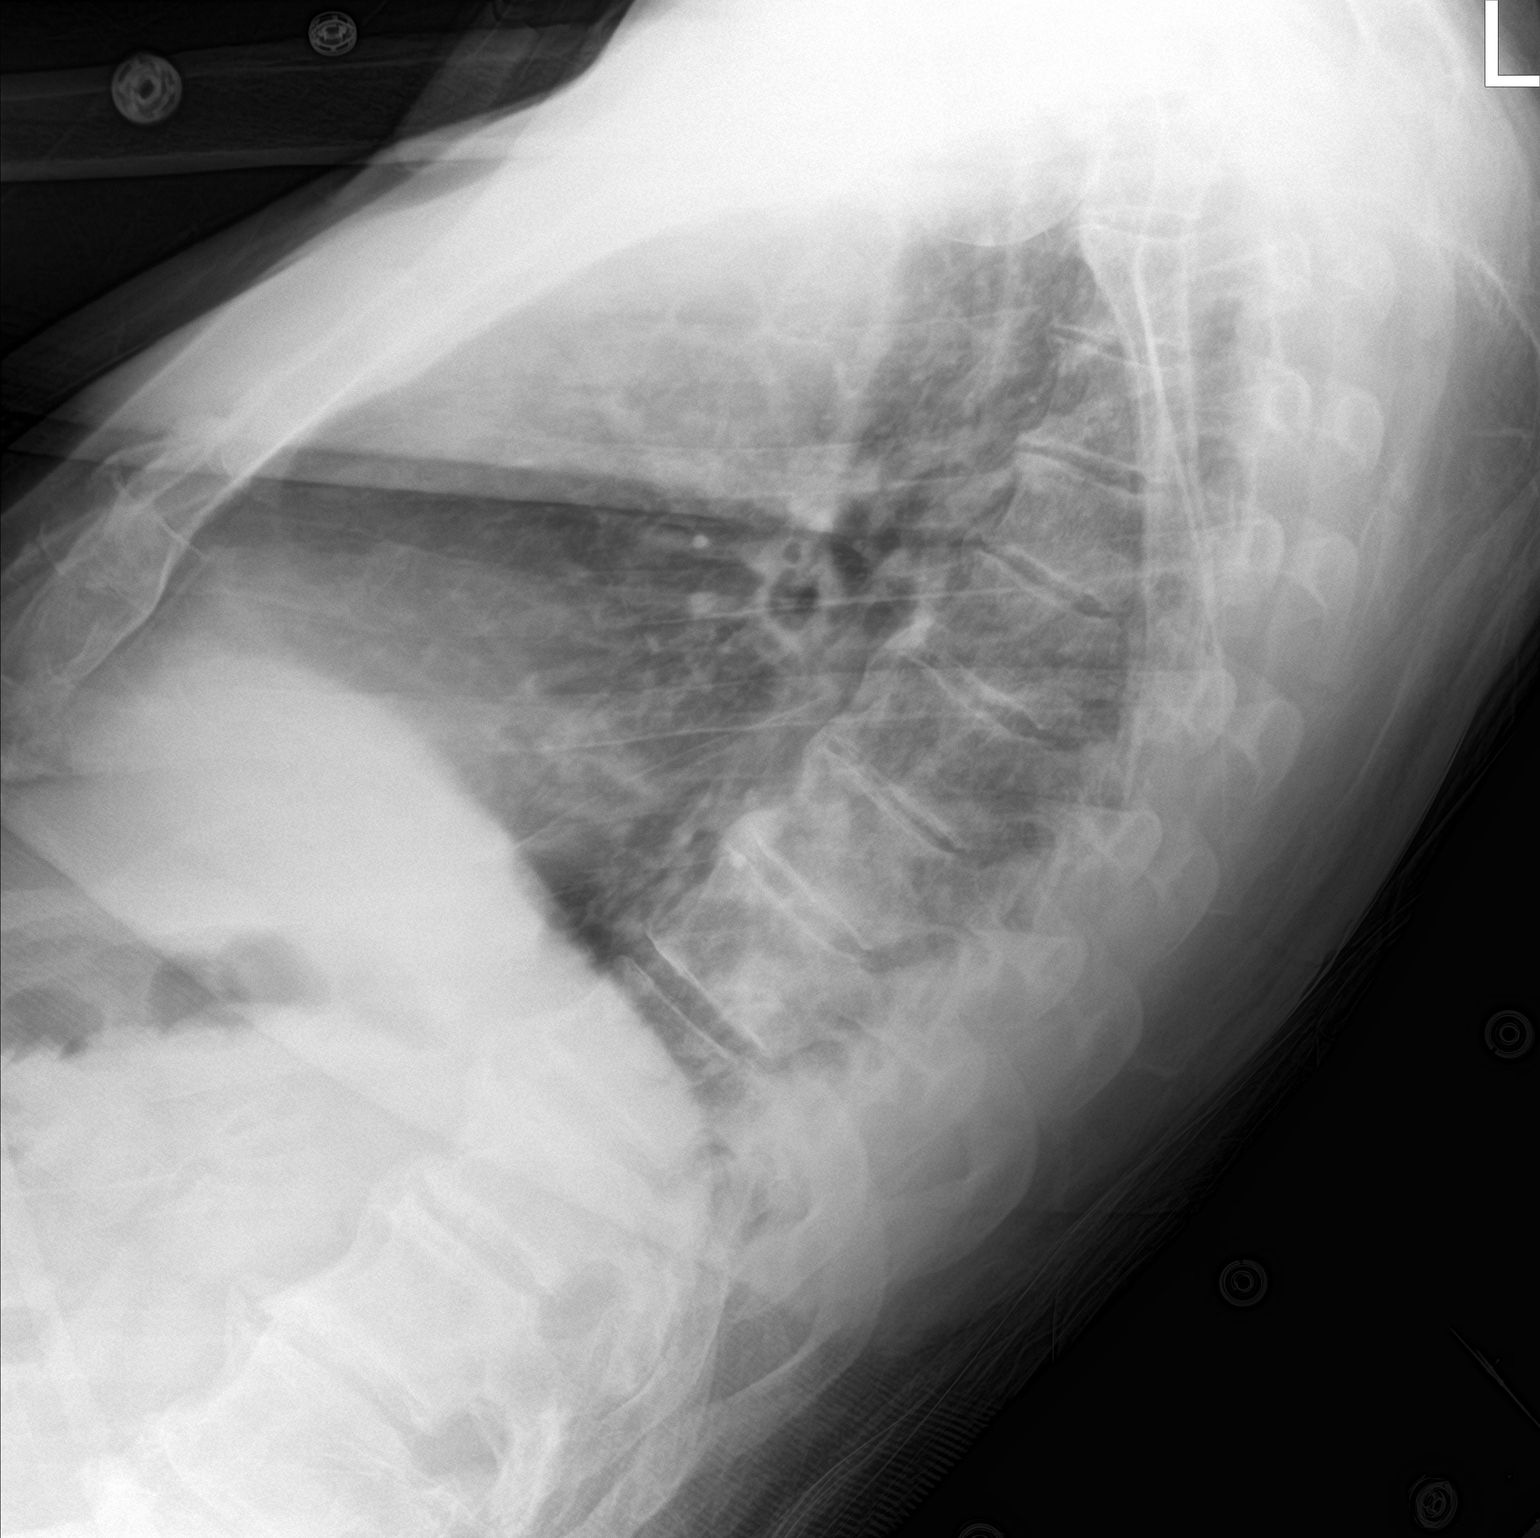

[chest ap]
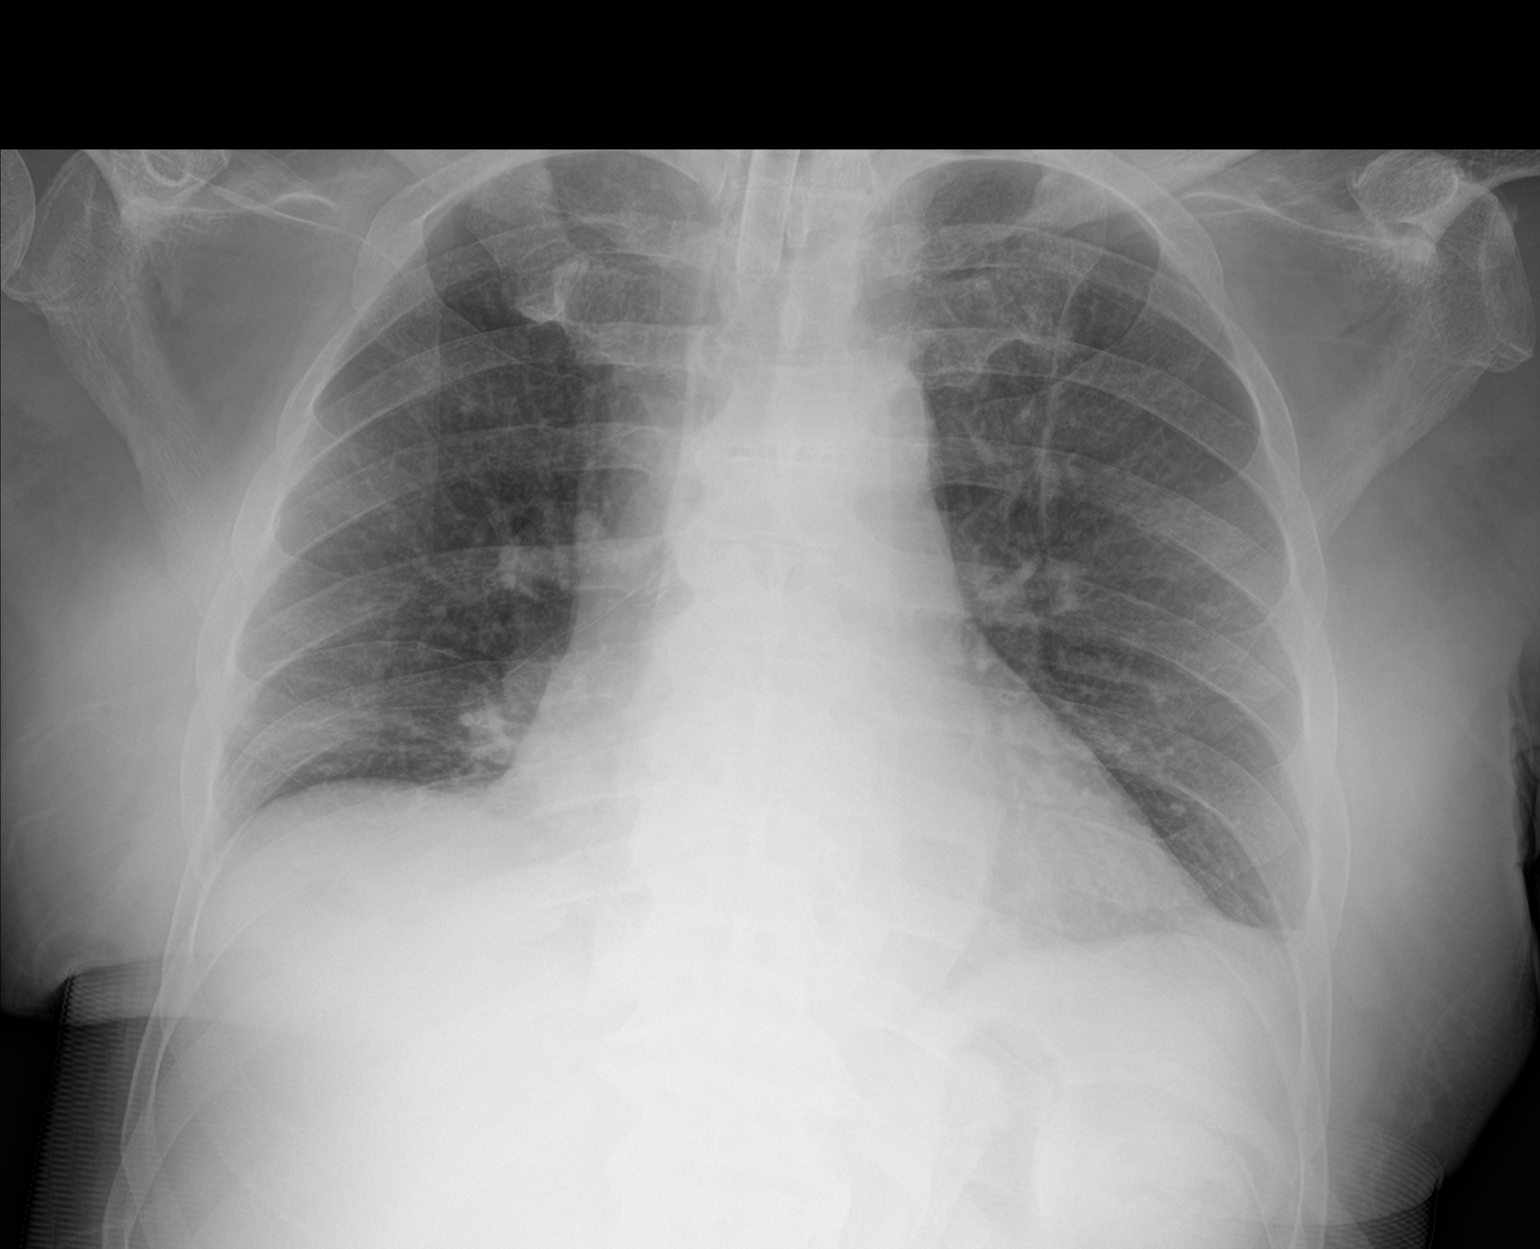

[2 of 2 positions shown; findings below may reference images not displayed]

FINDINGS: Tracheostomy tube in stable position. Mediastinum hilar structures
stable. Stable cardiomegaly. No pulmonary venous congestion. Low
lung volumes with mild bibasilar atelectasis/infiltrates again
noted. Stable left base pleuroparenchymal thickening consistent with
scarring. Degenerative change thoracic spine.
IMPRESSION: 1. Tracheostomy tube in stable position.
2. Stable cardiomegaly.
3. Low lung volumes with mild bibasilar atelectasis/infiltrates
again noted.

## 2020-07-11 MED ORDER — FUROSEMIDE 40 MG PO TABS
40.0000 mg | ORAL_TABLET | Freq: Once | ORAL | Status: AC
  Administered 2020-07-11: 40 mg via ORAL
  Filled 2020-07-11: qty 1

## 2020-07-11 MED ORDER — FERROUS SULFATE 75 (15 FE) MG/ML PO SOLN
15.0000 mg | Freq: Every day | ORAL | Status: DC
  Administered 2020-07-11: 15 mg via ORAL
  Filled 2020-07-11: qty 1

## 2020-07-11 MED ORDER — CALCIUM POLYCARBOPHIL 625 MG PO TABS
1250.0000 mg | ORAL_TABLET | Freq: Every day | ORAL | Status: DC
  Administered 2020-07-11 – 2020-07-14 (×4): 1250 mg
  Filled 2020-07-11 (×4): qty 2

## 2020-07-11 MED ORDER — SIMETHICONE 40 MG/0.6ML PO SUSP
80.0000 mg | Freq: Four times a day (QID) | ORAL | Status: DC
  Administered 2020-07-11 – 2020-07-20 (×36): 80 mg
  Filled 2020-07-11 (×37): qty 1.2

## 2020-07-11 MED ORDER — SENNOSIDES 8.8 MG/5ML PO SYRP
5.0000 mL | ORAL_SOLUTION | Freq: Every day | ORAL | Status: DC
  Administered 2020-07-11 – 2020-07-14 (×4): 5 mL
  Filled 2020-07-11 (×3): qty 5

## 2020-07-11 MED ORDER — SENNOSIDES 8.8 MG/5ML PO SYRP
15.0000 mL | ORAL_SOLUTION | Freq: Every day | ORAL | Status: DC
  Filled 2020-07-11: qty 15

## 2020-07-11 MED ORDER — FERROUS SULFATE 325 (65 FE) MG PO TABS
325.0000 mg | ORAL_TABLET | Freq: Every day | ORAL | Status: DC
  Filled 2020-07-11: qty 1

## 2020-07-11 MED ORDER — ADULT MULTIVITAMIN LIQUID CH
5.0000 mL | Freq: Every day | ORAL | Status: DC
  Administered 2020-07-12 – 2020-07-20 (×9): 5 mL
  Filled 2020-07-11 (×9): qty 15

## 2020-07-11 MED ORDER — FERROUS SULFATE 300 (60 FE) MG/5ML PO SYRP
15.0000 mg | ORAL_SOLUTION | Freq: Every day | ORAL | Status: DC
  Administered 2020-07-12 – 2020-07-20 (×9): 15.6 mg
  Filled 2020-07-11 (×10): qty 5

## 2020-07-11 NOTE — Patient Care Conference (Signed)
Inpatient RehabilitationTeam Conference and Plan of Care Update Date: 07/11/2020   Time: 11:11 AM    Patient Name: Ryan Day      Medical Record Number: [id number]  Date of Birth: 1950/10/15 Sex: Male         Room/Bed: 4W15C/4W15C-01 Payor Info: Payor: MEDICARE / Plan: MEDICARE PART A / Product Type: *No Product type* /    Admit Date/Time:  06/23/2020  4:40 PM  Primary Diagnosis:  Spinal cord injury, cervical region, sequela [facility])  Hospital Problems: Principal Problem:   Spinal cord injury, cervical region, sequela ([place]) Active Problems:   Pressure injury of skin    Expected Discharge Date: Expected Discharge Date:  (SNF placement)  Team Members Present: Physician leading conference: Dr. Leeroy Cha Care Coodinator Present: [name], LCSWA;Josealberto Montalto Creig Hines, RN, BSN, CRRN Nurse Present: Annita Brod, LPN PT Present: Excell Seltzer, PT OT Present: Roanna Epley, [place], OT SLP Present: Charolett Bumpers, SLP PPS Coordinator present : Ileana Ladd, Burna Mortimer, SLP     Current Status/Progress Goal Weekly Team Focus  Bowel/Bladder   Neurogenic B/B foley catheter pt on bowel program nightly LBM 10/18  continue bowel program document results, educate family/patient on B/B programs, foley care  Assess need for continued foley catheter, bowel program nightly with charted results, continue to assess educational needs of family and patient   Swallow/Nutrition/ Hydration   NPO, water protcol  Supervision A  Repeat MBS 10/20, EMST, pharyngeal exercises   ADL's   tot A+2 for all bed mobility, dependent transfers via lift, max verbal cues to direct care  dynamic sitting balance-mod A; self feeding-max A; grooming-supervision; directing care with BADLs and transfers-supervision (functional goals to be downgraded)  activity tolerance, sitting balance, education, bed mobility   Mobility   +2 rolling, transfers dependently via lift, has initiated PWC mobility  mod  I at [facility] level, family independent with assisting pt with transfers  family education, d/c planning, PWC mobility, BP management   Communication   Min-Mod A phrase level, slight increase in vocal intensity  Supervision A  voice commands with phone, increase vocal intensity, EMST   Safety/Cognition/ Behavioral Observations            Pain   denies pain  remain free of pain  assess pain qshift and prn medicate as prescribed   Skin   Stage II right buttocks, santyl with moist to dry foam dressing daily- yellow eschar still present along with beefy red tissue formation, scab to meatus present continue abx treatment, edema still noted to penis  improvement of pressure injury and penile wound, decreased swelling of penis no new injury or infections  wound care as directed assess skin qshift and prn     Discharge Planning:  D/c to home with his wife who plans on providing 24/7 care. She does report having back issues, but is still willing to provide care. Fam edu scheduled for 10/17 and 10/18. SW discussed alternative options with pt such as SNF-short term rehab if unable to find a HHA. SNF referral sent out.   Team Discussion: Bowel program in place, foley in place, penis still swollen, skin still retracted. Ab binder, TED's in place. Tube feeds going well. Wife here for family education. PT +2 rolling, dependent transfers by mechanical lift, initiated Power W/C mobility. OT wife is participating in family education. Total assist +2 for bed mobility, max verbal cues to direct self care. Therapy explained to patient that he needs to speak up and direct his care.  Family meeting is to be scheduled. Patient on target to meet rehab goals: yes  *See Care Plan and progress notes for long and short-term goals.   Revisions to Treatment Plan:  SLP to do repeat MBS tomorrow Teaching Needs: Continue family education and continue to education patient on how to direct his care.  Current Barriers to  Discharge: Decreased caregiver support, Medical stability, Home enviroment access/layout, Trach, Neurogenic bowel and bladder, Wound care, Lack of/limited family support and Nutritional means  Possible Resolutions to Barriers: Educate wound care and dressing changes, educate trach care, foley care, and bowel program. Continue nutritional support and supplements.      Medical Summary Current Status: Neurogenic bowels, tolerating tube feeds well, hgb decreased to 8, iron level low, CXR shows aspiration vs. atelectasis  Barriers to Discharge: Medical stability;Decreased family/caregiver support;Incontinence;Neurogenic Bowel & Bladder  Barriers to Discharge Comments: Neurogenic bowels, wife is overwhelmed with level of care that patinet requires, hgb decreased to 8, iron level low, CXR shows aspiration vs. atelectasis Possible Resolutions to Barriers/Weekly Focus: Abdominal binder, decrease flushes to 400 q4H, simethicone for has, bowel program, caregiver training of wife, start iron supplement, IS 10x/hr   Continued Need for Acute Rehabilitation Level of Care: The patient requires daily medical management by a physician with specialized training in physical medicine and rehabilitation for the following reasons: Direction of a multidisciplinary physical rehabilitation program to maximize functional independence : Yes Medical management of patient stability for increased activity during participation in an intensive rehabilitation regime.: Yes Analysis of laboratory values and/or radiology reports with any subsequent need for medication adjustment and/or medical intervention. : Yes   I attest that I was present, lead the team conference, and concur with the assessment and plan of the team.   Cristi Loron 07/11/2020, 4:24 PM

## 2020-07-11 NOTE — Consult Note (Addendum)
[facility] Nurse wound follow up Wound type: Reassment of sacrum/buttocks wounds. Refer to previous Lindsey consuls on 10/10 and 10/6.  Pt previously was noted to have a stage 2 pressure injury to his penis near the meatus; a medical device related pressure injury from a foley.  This wound has resolved and is healed when the location was assessed. The penis remains with generalized edema and foley is in place related to the patient's paralysis.   Sacrum/bilat upper buttocks with previously noted stage 3 pressure injuries which were present on admission have evolved into 2 areas which are separated by a narrow skin bridge between the areas. Location is approx 7X7cm with 10% red, 90% slough/eschar, small amt bloody drainage. No odor Dressing procedure/placement/frequency: Topical treatment orders have been provided for bedside nurses to perform to assist with enzymatic debridement. Continue present plan of care with Santyl.  Pt is on a low airloss mattress to reduce pressure.  Please re-consult if further assistance is needed.  Thank-you,  Julien Girt MSN, [place], [place], [place], [place]

## 2020-07-11 NOTE — Progress Notes (Signed)
Pt down to XRAY per bed. No other needs.  Sheela Stack, LPN

## 2020-07-11 NOTE — Progress Notes (Signed)
Patient reported to have increase in congested lung sounds. Has been getting water boluses every 2 hours and question of fluid overload. CXR with mild effusions. Discussed with Dr. Kara Mead status stable-->recommended treat 40 mg Lasix for diuresis. Labs ordered for tomorrow

## 2020-07-11 NOTE — Progress Notes (Signed)
Speech Language Pathology Daily Session Note  Patient Details  Name: Ryan Day MRN: [id number] Date of Birth: 1951/04/30  Today's Date: 07/11/2020 SLP Individual Time: 0902-0930 SLP Individual Time Calculation (min): 28 min  Short Term Goals: Week 3: SLP Short Term Goal 1 (Week 3): Patient will achieve and maintain adequate vocal intensity during trials and use of voice-activated adaptive equipement. SLP Short Term Goal 2 (Week 3): Patient will participate in trials of voice-activated and controlled devices with SLP. SLP Short Term Goal 3 (Week 3): Patient will consume PO trials of ice chips/small controlled water sips with minimal incidence of overt s/s of aspiration or penetration. SLP Short Term Goal 4 (Week 3): Patient will perform EMST and swallow strenghening exercises with setup A and supervisionA  Skilled Therapeutic Interventions:Skilled ST services focused on swallow skills. Pt continues to demonstrate slight increase in vocal intensity at word-phrase level with continued intermittent congestive vocal quality. Pt reported just receiving deep suction, but not much was retrieved. Pt was only able to cough up minimal secretions during treatment session. SLP reviewed vital signs which remain WFL, however chest x-ray on 10/17 noted no pneumonia, but likely aspiration or atelectasis. SLP scheduled repeat MBS for tomorrow to determine current swallow function and safety of water protocol. SLP facilitated trials of thin lqiuids via ice chips following oral care. Pt consumed x10 ice chips with min A verbal cues for use of effortful swallow and vocal quality remained clear. Pt did demonstrate automatic secondary swallow immediately after each, trial but when questioned stated for "extra practice" verse sensation of residue. Pt completed masako x6 and EMST set at 6 cm H20 x10 (self perceived effort score of 3/10) and 7cm H20 x5. SLP instructed pt to continue EMST set at 7cm H20 completing 5-10  repetitions in a row. Pt was able to sustain phonation of "ah" for 2 seconds. Pt was left in room with call bell within reach and bed alarm set. SLP recommends to continue skilled services.     Pain Pain Assessment Pain Scale: 0-10 Pain Score: 0-No pain  Therapy/Group: Individual Therapy  Keil Pickering  [facility] 07/11/2020, 12:23 PM

## 2020-07-11 NOTE — Progress Notes (Signed)
Physical Therapy Session Note  Patient Details  Name: Ryan Day MRN: [id number] Date of Birth: Oct 31, 1950  Today's Date: 07/11/2020 PT Individual Time: 1300-1405 PT Individual Time Calculation (min): 65 min   Short Term Goals: Week 3:  PT Short Term Goal 1 (Week 3): =LTG due to ELOS  Skilled Therapeutic Interventions/Progress Updates:    Pt received seated in bed, agreeable to PT session. No complaints of pain. Seated BP 96/62 with use of abdominal binder, TEDs, and ACE wrap. Rolling R/L with assist x 2 for placement of maxi sky sling. Maxi sky transfer to [name]. Seated BP 112/41 initially while in chair. Pt with no complaints of dizziness or lightheadedness. [name], ATP present during therapy session to assist with assessing PWC mobility and trialing different controls. Adjusted head array controls and sensitivity of controls for improved patient ability to control and drive chair. Pt is able to drive up to 10 ft around his room with mod A needed due to decreased safety and familiarity with types of controls. Pt would benefit from increased practice with driving chair as well as performing tilt/recline pressure relief with chair. Seated BP assessed again at 122/71. Pt left semi-reclined in [name] in room with [place] suction in reach, sip/puff call button in reach, and wife and daughter present.  Therapy Documentation Precautions:  Precautions Precautions: Cervical Precaution Booklet Issued: No Precaution Comments: C collar on when egde of bed, upright or OOB Required Braces or Orthoses: Cervical Brace Cervical Brace: Hard collar, Other (comment) (C collar when edge of bed, upright or OOB) Restrictions Weight Bearing Restrictions: No   Therapy/Group: Individual Therapy   Excell Seltzer, PT, DPT  07/11/2020, 4:08 PM

## 2020-07-11 NOTE — Progress Notes (Signed)
Bowel program complete with suppository. Small mushy stool resulted. Pt tolerated well, no complications.  Sheela Stack, LPN

## 2020-07-11 NOTE — Progress Notes (Signed)
[facility] PHYSICAL MEDICINE & REHABILITATION PROGRESS NOTE   Subjective/Complaints: No complaints this morning. Denies pain, constipation, insomnia.  CXR shows atelectasis vs. Aspiration. Pneumonia unlikely given normal WBC.   ROS:   Pt denies SOB, abd pain, CP, - N/V, + neurogenic bowel and bladder no BM without bowel program, has foley    Objective:   No results found. Recent Labs    07/10/20 0645 07/11/20 0646  WBC 9.3 7.0  HGB 8.0* 8.0*  HCT 25.0* 25.2*  PLT 338 349   Recent Labs    07/10/20 0645  NA 139  K 4.1  CL 103  CO2 27  GLUCOSE 115*  BUN 29*  CREATININE 0.72  CALCIUM 9.1    Intake/Output Summary (Last 24 hours) at 07/11/2020 1115 Last data filed at 07/11/2020 0620 Gross per 24 hour  Intake 474 ml  Output 1775 ml  Net -1301 ml     Pressure Injury 06/23/20 Buttocks Right Unstageable - Full thickness tissue loss in which the base of the injury is covered by slough (yellow, tan, gray, green or brown) and/or eschar (tan, brown or black) in the wound bed. (Active)  06/23/20 1838  Location: Buttocks  Location Orientation: Right  Staging: Unstageable - Full thickness tissue loss in which the base of the injury is covered by slough (yellow, tan, gray, green or brown) and/or eschar (tan, brown or black) in the wound bed.  Wound Description (Comments):   Present on Admission: Yes     Pressure Injury 06/23/20 Buttocks Left Unstageable - Full thickness tissue loss in which the base of the injury is covered by slough (yellow, tan, gray, green or brown) and/or eschar (tan, brown or black) in the wound bed. (Active)  06/23/20 1839  Location: Buttocks  Location Orientation: Left  Staging: Unstageable - Full thickness tissue loss in which the base of the injury is covered by slough (yellow, tan, gray, green or brown) and/or eschar (tan, brown or black) in the wound bed.  Wound Description (Comments):   Present on Admission: Yes    Physical Exam: Vital  Signs Blood pressure 119/62, pulse 73, temperature 98.5 F (36.9 C), temperature source Oral, resp. rate 18, height 6\' 5"  (1.956 m), weight 105.2 kg, SpO2 97 %. General: Alert and oriented x 3, No apparent distress HEENT: Head is normocephalic, atraumatic, PERRLA, EOMI, sclera anicteric, oral mucosa pink and moist, dentition intact, ext ear canals clear,  Neck: Supple without JVD or lymphadenopathy Heart: Reg rate and rhythm. No murmurs rubs or gallops Chest: CTA bilaterally without wheezes, rales, or rhonchi; no distress Abdomen: Soft, non-tender, non-distended, bowel sounds positive. Extremities: No clubbing, cyanosis, or edema. Pulses are 2+  Skin: PEG site with duoderm dressing, some crusting around tube   Skin: Stage II/deep or superficial Stage III- more slough is resolved (not all of it)- bright pink/granulating overall-    GU- foreskin retracted but reduceable, no tenderness, meatus without blood or d/c Able to reduce but then retracts again  Ox3- quiet Motor: B/l UE shoulder abduction 1+/5, distally 0/5 with ?trace flicker left wrist flexor B/l LE: 0/5 proximal to distal Sensation diminished to light touch in all extremities      Assessment/Plan: 1. 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  Physiatrist is providing close team supervision and 24 hour management of active medical problems listed below.  Physiatrist and rehab team continue to assess barriers to discharge/monitor patient progress  toward functional and medical goals  Care Tool:  Bathing              Bathing assist Assist Level: 2 Helpers     Upper Body Dressing/Undressing Upper body dressing   What is the patient wearing?: Pull over shirt    Upper body assist Assist Level: 2 Helpers    Lower Body Dressing/Undressing Lower body dressing      What is the patient wearing?: Incontinence  brief, Pants     Lower body assist Assist for lower body dressing: 2 Helpers     Toileting Toileting    Toileting assist Assist for toileting: Dependent - Patient 0%     Transfers Chair/bed transfer  Transfers assist  Chair/bed transfer activity did not occur: Safety/medical concerns  Chair/bed transfer assist level: Dependent - mechanical lift     Locomotion Ambulation   Ambulation assist   Ambulation activity did not occur: Safety/medical concerns          Walk 10 feet activity   Assist           Walk 50 feet activity   Assist           Walk 150 feet activity   Assist           Walk 10 feet on uneven surface  activity   Assist           Wheelchair     Assist Will patient use wheelchair at discharge?: Yes Type of Wheelchair: Power Wheelchair activity did not occur: Safety/medical concerns         Wheelchair 50 feet with 2 turns activity    Assist    Wheelchair 50 feet with 2 turns activity did not occur: Safety/medical concerns       Wheelchair 150 feet activity     Assist  Wheelchair 150 feet activity did not occur: Safety/medical concerns       Blood pressure 119/62, pulse 73, temperature 98.5 F (36.9 C), temperature source Oral, resp. rate 18, height 6\' 5"  (1.956 m), weight 105.2 kg, SpO2 97 %.    Medical Problem List and Plan: 1.  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  Antithrombotics: -DVT/anticoagulation:  Pharmaceutical: Lovenox--check dopplers in am.  10/5- Dopplers (-)  10/10- needs for 3 months from starting it             -antiplatelet therapy: N/A  3. Pain Management: N/A 4. Mood: LCSW to follow for evaluation and support.              -antipsychotic agents: N/A   Neuropsych following  5. Neuropsych: This patient is capable of making decisions on his own behalf. 6. Sacral decub/Skin/Wound Care: Pictures show wound in different stages of healing. Ordered Air mattress overlay. Continue to reposition q2H. Continue vitamin and prostat to promote healing. Will add Juven also for collagen. Will increase tube feed to 50 cc/hr as likely has higher nutritional need. Will consult dietician to help address nutritional needs.   10/4- will consult dietician- BUN up to 54 from 27 and Cr 1.19 from <1- asked to evaluate dryness  10/5- haven't seen a dietician note- will check on this.  10/9- less slough, so will ask WOC to see if can change to silver alginate, or something like that.  Also ordered neosporin for new wound around Urethral meatus  10/10- waiting for wound care to see- since weekend.   10/11: appreciate WOC eval and recs  7. Fluids/Electrolytes/Nutrition: Monitor I/Os.CMP ordered. Transition to bolus tube feeds next week if stable.   Hypernatremia dn pre rennal azotemia- nephro  8. Orthostatic Hypotension: Secondary to autonomic dysfunction has resolved. BP soft to 110/55.               Vitals:   07/11/20 0510 07/11/20 0801  BP:    Pulse:  73  Resp:  18  Temp:    SpO2: 97% 97%   9. Acute on chronic anemia: Improving--continue to monitor. Added iron supplement.              Hgb stable 10/11  10/18: dropped to 8- repeat tomorrow with iron level  10/19: Hgb stable at 8.0 today on 10/19. Iron level low- supplement started. 10. Hypernatremia/: Added water flushes. May need low sodium formula.   10/4- Na 144- doing OK  10/9- Na 151- rechecking in AM  10/11: 156- nephro consulted Responding to free H20 11. Neurogenic bladder: Maintain foley due to sacral wounds.. 12. Neurogenic bowel:Continue senna in am with  suppository in evenings for bowel program. Moving bowels regularly   10/19: continue bowel program 13. Secretions/Trach/capped: continue scopolamine patch  10/15- con't trach, because still needing suctioning- pt refused today, but sounds like he needs it.  Cough with clear sputum, had desat requiring O2 this am which has resolved no CP or SOB will check portable xray  No signs of pum edema, ? RLL infiltrate but looks minimally changed since 9/27 14. Azotemia/NPO- getting TFs  Prior- Na got up to 156 and BUN up to 58- but BUN was 42 and Na down to 147 as of 10/14  10/15- Labs pending- will assess when they come up.   15. W/C requirement- will need either head array OR chin control power w/c- have already discussed with w/c rep/company- they are working on getting a demo for him.   16. Hypokalemia  10/14- will give repletion of KCL today 40 mEq x1.    10/15- labs pending 17. Spasticity  10/15- pt asking for spasticity meds- for annoying spasms- will try Baclofen 5 mg TID and see how he does.   18.  Retracted foreskin with chronic foley, no sign of ischemia, edema posterior foreskin, may be related to increased peripheral edema although no scrotal swelling - discussed with Uro on call Dr Sheppard Coil who felt that Uro eval was not needed at this time 19. Disposition: plan for family meeting  on Thurs. Plan for D/c to SNF given level of care is overwhelming for wife.   LOS: 18 days A FACE TO FACE EVALUATION WAS PERFORMED  Maxum Cassarino P Averleigh Savary 07/11/2020, 11:15 AM

## 2020-07-11 NOTE — Progress Notes (Signed)
Occupational Therapy Session Note  Patient Details  Name: Ryan Day MRN: [id number] Date of Birth: 1951-05-06  Today's Date: 07/11/2020 OT Individual Time: 1000-1100 OT Individual Time Calculation (min): 60 min    Short Term Goals: Week 3:  OT Short Term Goal 1 (Week 3): Pt will roll B in bed with MOD +2 A OT Short Term Goal 2 (Week 3): Pt will direct functional transfers with max VC OT Short Term Goal 3 (Week 3): Pt will maintain static sitting balnace wiht MAX A of 1 caregiver for 3 min at EOB/EOM  Skilled Therapeutic Interventions/Progress Updates:    Pt resting in bed upon arrival.  Pt states that he want to sit in chair position in [place] bed. OT intervention with focus on bed mobility, family education, pt education, and activity tolerance. Tot A+2 for rolling in bed to facilitate placement of abdominal binder. Ted hose and ace wraps donned. Pt required max verbal cues to direct placement of C collar. Wife and daughter arrived.  Educated on [place] placement and educated on pt's BUE shoulder sublux. Discussed with pt the importance of being his own advocate and directing care. Pt requires multiple occasions of suction.  BP with ted hose, ace wraps, and abdominal binder-94/63 (30 degrees), 105/65 (47*), and 111/62 (57*). Pt remained in chair position with strap across hips and all needs within reach. Wife and daughter present.   Therapy Documentation Precautions:  Precautions Precautions: Cervical Precaution Booklet Issued: No Precaution Comments: C collar on when egde of bed, upright or OOB Required Braces or Orthoses: Cervical Brace Cervical Brace: Hard collar, Other (comment) (C collar when edge of bed, upright or OOB) Restrictions Weight Bearing Restrictions: No Pain: Pain Assessment Pain Scale: 0-10 Pain Score: 0-No pain  Therapy/Group: Individual Therapy  [name] 07/11/2020, 12:17 PM

## 2020-07-11 NOTE — Progress Notes (Signed)
Physical Therapy Session Note  Patient Details  Name: Ryan Day MRN: [id number] Date of Birth: 04/24/1951  Today's Date: 07/11/2020 PT Individual Time: 2353-6144 PT Individual Time Calculation (min): 10 min  PT Amount of Missed Time (min): 50 Minutes PT Missed Treatment Reason: Xray;Unavailable (Comment) (nursing)  Short Term Goals: Week 3:  PT Short Term Goal 1 (Week 3): =LTG due to ELOS  Skilled Therapeutic Interventions/Progress Updates:    Pt initially off floor for xray. Pt missed 15 min of scheduled therapy session initially due to xray. Once pt returned from xray agreeable to PT session. No complaints of pain. Assisted pt with repositioning BUE in bed and placing [place] suction in reach. Nursing then in room to tend to patient. Pt missed another 35 min of scheduled therapy session due to nursing needs. Pt missed a total of 50 min this session due to being off the floor for xray then due to needing nursing care.  Therapy Documentation Precautions:  Precautions Precautions: Cervical Precaution Booklet Issued: No Precaution Comments: C collar on when egde of bed, upright or OOB Required Braces or Orthoses: Cervical Brace Cervical Brace: Hard collar, Other (comment) (C collar when edge of bed, upright or OOB) Restrictions Weight Bearing Restrictions: No   Therapy/Group: Individual Therapy   Excell Seltzer, PT, DPT  07/11/2020, 5:29 PM

## 2020-07-11 NOTE — Progress Notes (Signed)
Nutrition Follow-up  RD working remotely.  DOCUMENTATION CODES:   Not applicable  INTERVENTION:   - Please obtain updated weight  Continue bolus tube feeds via PEG: - 474 ml (2 cartons) Osmolite 1.5 cal formula QID - ProSource TF 45 ml BID - Free water flushes of 400 ml q 4 hours  Tube feeding regimen provides 2920 kcal, 142 grams of protein, and 1448 ml of H2O.   Total free water with current flushes: 3848 ml  NUTRITION DIAGNOSIS:   Increased nutrient needs related to wound healing as evidenced by estimated needs.  Ongoing  GOAL:   Patient will meet greater than or equal to 90% of their needs  Met via TF  MONITOR:   TF tolerance, Skin, Diet advancement, Labs  REASON FOR ASSESSMENT:   Consult Enteral/tube feeding initiation and management  ASSESSMENT:   69 yo male admitted with functional deficits due to SCI/quadriplegia. PMH includes recent admission to [facility] 8/4 s/p fall, striking his head, and resulting in quadriplegia. Hospitalization complicated by PTX, C diff colitis, PEA, respiratory distress, aspiration PNA, AKI. Also required trach and PEG. Discharged to [facility] 9/15. Transferred to [facility] for intense rehab as recommended by his therapy team.  Attempted to speak with pt in room but other care providers in with pt during attempts. Spoke with RN to inquire about TF regimen. RN reports pt is tolerating tube feeds well at this time. Pt was having some bloating but simethicone ordered and is helping. Pt had MBS this morning. Results still pending. RD will follow for diet advancement and adjust TF regimen as appropriate.  Current TF: Osm 1.5 cal 474 ml QID, ProSource TF 45 ml BID, free water flushes of 400 ml q 4 hours  Medications reviewed and include: vitamin C, dulcolax, pepcid, ferrous sulfate, prozac, SSI, MVI, Juven, fibercon, florastor, senokot, simethicone, zinc sulfate  Labs reviewed: hemoglobin 8.0 CBG's: 115-123 x 24 hours  UOP:  2700 ml x 24 hours  Diet Order:   Diet Order            Diet NPO time specified  Diet effective now                 EDUCATION NEEDS:   Not appropriate for education at this time  Skin:  Skin Assessment: Skin Integrity Issues: Unstageable: bilateral buttocks  Last BM:  07/11/20  Height:   Ht Readings from Last 1 Encounters:  06/23/20 '6\' 5"'  (1.956 m)    Weight:   Wt Readings from Last 1 Encounters:  06/23/20 105.2 kg    Ideal Body Weight:  94.5 kg  BMI:  Body mass index is 27.5 kg/m.  Estimated Nutritional Needs:   Kcal:  2600-2800  Protein:  145-165 gm  Fluid:  >/= 2.4 L    Gaynell Face, MS, RD, LDN Inpatient Clinical Dietitian Please see [facility] for contact information.

## 2020-07-11 NOTE — Progress Notes (Signed)
Patient ID: Ryan Day, male   DOB: 09-23-1951, 69 y.o.   MRN: [id number]  SW met with pt, pt wife, and pt dtr [name] in room to provide updates from team conference, d/c date remains 10/28, and challenges with establishing a HHA. SW discussed pt requiring total care at discharge, and unable to d/c patient to home with no home health due to trach care needs. SW informed will explore his PCP and see if they can manage trach care needs. SW discussed short term rehab as last resort. Pt and wife would like to avoid this option.  [name], MSW, [place] Office: [phone] Cell: [phone] Fax: [phone]

## 2020-07-11 NOTE — Progress Notes (Signed)
Pt return to room from xray in bed. No complications noted at this time. Sheela Stack, LPN

## 2020-07-12 ENCOUNTER — Telehealth: Payer: Self-pay

## 2020-07-12 ENCOUNTER — Inpatient Hospital Stay (HOSPITAL_COMMUNITY): Payer: BC Managed Care – PPO | Admitting: Physical Therapy

## 2020-07-12 ENCOUNTER — Inpatient Hospital Stay (HOSPITAL_COMMUNITY): Payer: BC Managed Care – PPO

## 2020-07-12 ENCOUNTER — Encounter (HOSPITAL_COMMUNITY): Payer: BC Managed Care – PPO

## 2020-07-12 ENCOUNTER — Inpatient Hospital Stay (HOSPITAL_COMMUNITY): Payer: BC Managed Care – PPO | Admitting: Occupational Therapy

## 2020-07-12 DIAGNOSIS — S14109S Unspecified injury at unspecified level of cervical spinal cord, sequela: Secondary | ICD-10-CM | POA: Diagnosis not present

## 2020-07-12 LAB — BASIC METABOLIC PANEL
Anion gap: 9 (ref 5–15)
BUN: 29 mg/dL — ABNORMAL HIGH (ref 8–23)
CO2: 28 mmol/L (ref 22–32)
Calcium: 9.3 mg/dL (ref 8.9–10.3)
Chloride: 103 mmol/L (ref 98–111)
Creatinine, Ser: 0.71 mg/dL (ref 0.61–1.24)
GFR, Estimated: >60 mL/min (ref >60)
Glucose, Bld: 122 mg/dL — ABNORMAL HIGH (ref 70–99)
Potassium: 4 mmol/L (ref 3.5–5.1)
Sodium: 140 mmol/L (ref 135–145)

## 2020-07-12 LAB — GLUCOSE, CAPILLARY
Glucose-Capillary: 115 mg/dL — ABNORMAL HIGH (ref 70–99)
Glucose-Capillary: 102 mg/dL — ABNORMAL HIGH (ref 70–99)

## 2020-07-12 NOTE — Progress Notes (Signed)
Pt off unit per bed for swallow study. Deep suctioning completed. No complications. Sheela Stack, LPN

## 2020-07-12 NOTE — Progress Notes (Signed)
Quad cough x3 performed. Pt tolerated well. Pt trach suctioned. Pt tolerated well. No complications./ Sheela Stack, LPN

## 2020-07-12 NOTE — Progress Notes (Addendum)
Pt bowel program complete. Medium mushy results. No complications. Pt tolerated well.  Also quad coughed patient at this time and gave ice chips per request.  Sheela Stack, LPN

## 2020-07-12 NOTE — Progress Notes (Signed)
Physical Therapy Session Note  Patient Details  Name: Ryan Day MRN: [id number] Date of Birth: 10-30-1950  Today's Date: 07/12/2020 PT Individual Time: 1300-1400; 1615-1700 PT Individual Time Calculation (min): 60 min and 45 min  Short Term Goals: Week 3:  PT Short Term Goal 1 (Week 3): =LTG due to ELOS  Skilled Therapeutic Interventions/Progress Updates:    Session 1: Pt received seated in bed in chair position, agreeable to PT session but declines any OOB mobility this date. Encouraged pt to get up to [facility] to continue to trial mobility, pt declines. Pt also encouraged to get up to any chair this session as he has not been out of bed this date. Pt declines and reports feeling fatigued. Pt agreeable to bed level therapy. Session focus on B UE and LE PROM to prevent contracture. Reviewed positioning of UE to support shoulder and assist with B hand edema. PROM to B UE and LE in available planes of motion. Pt exhibits limited shoulder PROM bilaterally at about 70 degrees flexion and 90 degrees abduction. BP initially at 109/61 while pt seated in chair position in bed with use of THT and ACE wraps. At end of session pt in supine position and BP 109/65. Pt left supine in bed with needs in reach at end of session.  Session 2: Pt received supine in bed asleep, arousable and agreeable to PT session. No complaints of pain. Pt placed in seated position bed. Seated BP 111/65 with use of abdominal binder, TEDs, ACE wraps. Session focus on use of adaptive technology in order for patient to be able to contact his wife via his cell phone. After trial and error with patient using voice commands on his phone he is able to call his wife successfully by saying "Hey [employer]", then "call wifey on speakerphone". Pt encouraged to use louder voice as voice commands have trouble activating when he speaks softly. Also assisted pt with quad cough x 3 during session due to congestion, frequent use of [place] suction throughout  session. Pt left seated in bed in care of nursing at end of session.  Therapy Documentation Precautions:  Precautions Precautions: Cervical Precaution Booklet Issued: No Precaution Comments: C collar on when egde of bed, upright or OOB Required Braces or Orthoses: Cervical Brace Cervical Brace: Hard collar, Other (comment) (C collar when edge of bed, upright or OOB) Restrictions Weight Bearing Restrictions: No    Therapy/Group: Individual Therapy   Excell Seltzer, PT, DPT  07/12/2020, 2:11 PM

## 2020-07-12 NOTE — Progress Notes (Signed)
Occupational Therapy Session Note  Patient Details  Name: Ryan Day MRN: [id number] Date of Birth: December 02, 1950  Today's Date: 07/12/2020 OT Individual Time: 1100-1215 OT Individual Time Calculation (min): 75 min    Short Term Goals: Week 3:  OT Short Term Goal 1 (Week 3): Pt will roll B in bed with MOD +2 A OT Short Term Goal 2 (Week 3): Pt will direct functional transfers with max VC OT Short Term Goal 3 (Week 3): Pt will maintain static sitting balnace wiht MAX A of 1 caregiver for 3 min at EOB/EOM  Skilled Therapeutic Interventions/Progress Updates:    Pt resting in bed upon arrival, stating he doesn't want to get into PWC this morning but agreed to chair position in [place] bed. Pt incontinent of bowel and dependent for hygiene and donning clean brief. Ted hose and ace wraps donned on BLE. RN notified that pt's dressing required changing.  Ongoing swelling of pt's penis noted and PA and charge nurse Deidre Ala) notifed. PA Pam, RH Hilary, and LPN Aileen Pilot present for education by PA regarding foley care and hygiene with uncircumsised male. Quad cough education for RN and LPN also with return demonstration.  Kreg bed education for provided for RN and LPN regarding bed positioning and functions. Education provided RN and LPN regarding positioning in bed for BUE. Pt remained in chair position in bed with RN and LPN present.   Therapy Documentation Precautions:  Precautions Precautions: Cervical Precaution Booklet Issued: No Precaution Comments: C collar on when egde of bed, upright or OOB Required Braces or Orthoses: Cervical Brace Cervical Brace: Hard collar, Other (comment) (C collar when edge of bed, upright or OOB) Restrictions Weight Bearing Restrictions: No Pain: Pain Assessment Pain Scale: 0-10 Pain Score: 0-No pain    Therapy/Group: Individual Therapy  [name] 07/12/2020, 12:29 PM

## 2020-07-12 NOTE — Progress Notes (Signed)
Patient ID: Ryan Day, male   DOB: 1950/12/10, 69 y.o.   MRN: [id number]  SW spoke with Jalissa/reception at [facility] [phone]) to discuss if their provider [facility]) would be willing to manage his trach care needs. Reports their CMA is off today, and will send message to team and request them to follow-up.   SW returned phone call to Christy/RN with [facility] ([phone]) to discuss challenges with obtaining [facility] services for patient. Reports that she will call some of the [facility] agencies and see if there are any responses.   SW spoke with [facility] Intake with [facility] [phone]) to follow-up on referral, and was informed that pt was not accepted due to staffing and that someone called SW. SW informed there was no follow-up to discuss details of referral.    SW waiting on updates from all HHAs to see if they would be willing to accept referral if pt PCP were willing to manage wound care and trach care needs. SW waiting on follow-up.    Declined HHA: [facility]-insurance; 10/20 waiting on updates [facility]-insurance and care needs; 10/20-still unable to accept due to staffing [facility]- insurance/out of network; 10/20- [facility]. SW waiting on updates if able to accept referral even with [facility]. [facility]-not in network; 20-30% co pay; 10/20 waiting on updates.  [facility]- insurance not in network; 10/20-remains declined [facility]- due to staffing [facility]- due to insurance; 10/20- waiting on updates.  [facility]- does not service area [place]- staffing [facility] Intake Dept - declined due to staffing  [name], MSW, [place] Office: [phone] Cell: [phone] Fax: [phone]

## 2020-07-12 NOTE — Progress Notes (Signed)
Quad cough x4 performed. Pt tolerated well. Sheela Stack, LPN

## 2020-07-12 NOTE — Progress Notes (Signed)
Quad cough x3 performed. Pt tolerated well. Sheela Stack, LPN

## 2020-07-12 NOTE — Telephone Encounter (Signed)
Unfortunately I do not have any experience managing trachs. I would recommend he establish with pulmonology.

## 2020-07-12 NOTE — Progress Notes (Signed)
Speech Language Pathology Note  Patient Details  Name: Ryan Day MRN: [id number] Date of Birth: [birth year] Today's Date: 07/12/2020  Modified Barium Swallow Study completed. SLP recommends pt remains NPO and can continue water protocol (ice chips) with conservative amounts at this time. Full note to follow tomorrow.    Sabella Traore 07/12/2020, 5:20 PM

## 2020-07-12 NOTE — Progress Notes (Signed)
[facility] PHYSICAL MEDICINE & REHABILITATION PROGRESS NOTE   Subjective/Complaints: Secretions still significant- discussed CXR results (stable) with patient. Encouraged IS use every hour 10x and he expressed willingness to do so. Family meeting tomorrow morning  ROS:   Pt denies SOB, abd pain, CP, - N/V, + neurogenic bowel and bladder no BM without bowel program, has foley    Objective:   DG Chest 2 View  Result Date: 07/11/2020 CLINICAL DATA:  Congestion. EXAM: CHEST - 2 VIEW COMPARISON:  07/09/2020.  06/19/2020. FINDINGS: Tracheostomy tube in stable position. Mediastinum hilar structures stable. Stable cardiomegaly. No pulmonary venous congestion. Low lung volumes with mild bibasilar atelectasis/infiltrates again noted. Stable left base pleuroparenchymal thickening consistent with scarring. Degenerative change thoracic spine. IMPRESSION: 1. Tracheostomy tube in stable position. 2. Stable cardiomegaly. 3. Low lung volumes with mild bibasilar atelectasis/infiltrates again noted. Electronically Signed   By: Marcello Moores  Register   On: 07/11/2020 16:18   Recent Labs    07/10/20 0645 07/11/20 0646  WBC 9.3 7.0  HGB 8.0* 8.0*  HCT 25.0* 25.2*  PLT 338 349   Recent Labs    07/10/20 0645 07/12/20 0634  NA 139 140  K 4.1 4.0  CL 103 103  CO2 27 28  GLUCOSE 115* 122*  BUN 29* 29*  CREATININE 0.72 0.71  CALCIUM 9.1 9.3    Intake/Output Summary (Last 24 hours) at 07/12/2020 1248 Last data filed at 07/12/2020 0353 Gross per 24 hour  Intake 0 ml  Output 2100 ml  Net -2100 ml     Pressure Injury 06/23/20 Buttocks Right Unstageable - Full thickness tissue loss in which the base of the injury is covered by slough (yellow, tan, gray, green or brown) and/or eschar (tan, brown or black) in the wound bed. (Active)  06/23/20 1838  Location: Buttocks  Location Orientation: Right  Staging: Unstageable - Full thickness tissue loss in which the base of the injury is covered by slough  (yellow, tan, gray, green or brown) and/or eschar (tan, brown or black) in the wound bed.  Wound Description (Comments):   Present on Admission: Yes     Pressure Injury 06/23/20 Buttocks Left Unstageable - Full thickness tissue loss in which the base of the injury is covered by slough (yellow, tan, gray, green or brown) and/or eschar (tan, brown or black) in the wound bed. (Active)  06/23/20 1839  Location: Buttocks  Location Orientation: Left  Staging: Unstageable - Full thickness tissue loss in which the base of the injury is covered by slough (yellow, tan, gray, green or brown) and/or eschar (tan, brown or black) in the wound bed.  Wound Description (Comments):   Present on Admission: Yes    Physical Exam: Vital Signs Blood pressure 121/74, pulse 81, temperature 97.9 F (36.6 C), resp. rate 16, height 6\' 5"  (1.956 m), weight 105.2 kg, SpO2 96 %.  General: Alert and oriented x 3, No apparent distress HEENT: Head is normocephalic, atraumatic, PERRLA, EOMI, sclera anicteric, oral mucosa pink and moist, dentition intact, ext ear canals clear,  Neck: Supple without JVD or lymphadenopathy Heart: Reg rate and rhythm. No murmurs rubs or gallops Chest: CTA bilaterally without wheezes, rales, or rhonchi; no distress Abdomen: Soft, non-tender, non-distended, bowel sounds positive. Extremities: No clubbing, cyanosis, or edema. Pulses are 2+  Skin: PEG site with duoderm dressing, some crusting around tube   Skin: Stage II/deep or superficial Stage III- more slough is resolved (not all of it)- bright pink/granulating overall-    GU- foreskin retracted  but reduceable, no tenderness, meatus without blood or d/c Able to reduce but then retracts again  Ox3- quiet Motor: B/l UE shoulder abduction 1+/5, distally 0/5 with ?trace flicker left wrist flexor B/l LE: 0/5 proximal to distal Sensation diminished to light touch in all extremities      Assessment/Plan: 1. Functional deficits  secondary to cervical SCI which require 3+ hours per day of interdisciplinary therapy in a comprehensive inpatient rehab setting.  Physiatrist is providing close team supervision and 24 hour management of active medical problems listed below.  Physiatrist and rehab team continue to assess barriers to discharge/monitor patient progress toward functional and medical goals  Care Tool:  Bathing              Bathing assist Assist Level: 2 Helpers     Upper Body Dressing/Undressing Upper body dressing   What is the patient wearing?: Pull over shirt    Upper body assist Assist Level: 2 Helpers    Lower Body Dressing/Undressing Lower body dressing      What is the patient wearing?: Incontinence brief, Pants     Lower body assist Assist for lower body dressing: 2 Helpers     Toileting Toileting    Toileting assist Assist for toileting: Dependent - Patient 0%     Transfers Chair/bed transfer  Transfers assist  Chair/bed transfer activity did not occur: Safety/medical concerns  Chair/bed transfer assist level: Dependent - mechanical lift     Locomotion Ambulation   Ambulation assist   Ambulation activity did not occur: Safety/medical concerns          Walk 10 feet activity   Assist           Walk 50 feet activity   Assist           Walk 150 feet activity   Assist           Walk 10 feet on uneven surface  activity   Assist           Wheelchair     Assist Will patient use wheelchair at discharge?: Yes Type of Wheelchair: Power Wheelchair activity did not occur: Safety/medical concerns  Wheelchair assist level: Moderate Assistance - Patient 50 - 74% Max wheelchair distance: 10'    Wheelchair 50 feet with 2 turns activity    Assist    Wheelchair 50 feet with 2 turns activity did not occur: Safety/medical concerns       Wheelchair 150 feet activity     Assist  Wheelchair 150 feet activity did not occur:  Safety/medical concerns       Blood pressure 121/74, pulse 81, temperature 97.9 F (36.6 C), resp. rate 16, height 6\' 5"  (1.956 m), weight 105.2 kg, SpO2 96 %.    Medical Problem List and Plan: 1.  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  Antithrombotics: -DVT/anticoagulation:  Pharmaceutical: Lovenox--check dopplers in am.  10/5- Dopplers (-)  10/10- needs for 3 months from starting it             -antiplatelet therapy: N/A  3. Pain Management: N/A 4. Mood: LCSW to follow for evaluation and support.              -antipsychotic agents: N/A   Neuropsych following  5. Neuropsych: This patient is capable of making decisions on his own behalf. 6. Sacral decub/Skin/Wound Care: Pictures show wound in different stages of healing. Ordered Air mattress overlay. Continue to reposition q2H. Continue vitamin and prostat to promote healing. Will add Juven also for collagen. Will increase tube feed to 50 cc/hr as likely has higher nutritional need. Will consult dietician to help address nutritional needs.   10/4- will consult dietician- BUN up to 54 from 27 and Cr 1.19 from <1- asked to evaluate dryness  10/5- haven't seen a dietician note- will check on this.  10/9- less slough, so will ask WOC to see if can change to silver alginate, or something like that.  Also ordered neosporin for new wound around Urethral meatus  10/10- waiting for wound care to see- since weekend.   10/11: appreciate WOC eval and recs  7. Fluids/Electrolytes/Nutrition: Monitor I/Os.CMP ordered. Transition to bolus tube feeds next week if stable.   Hypernatremia dn pre rennal azotemia- nephro  8. Orthostatic Hypotension: Secondary to autonomic dysfunction has resolved. BP soft to 110/55.               Vitals:   07/12/20 0847  07/12/20 1138  BP:    Pulse: 81   Resp: 16 16  Temp:    SpO2: 97% 96%   9. Acute on chronic anemia: Improving--continue to monitor. Added iron supplement.              Hgb stable 10/11  10/18: dropped to 8- repeat tomorrow with iron level  10/19: Hgb stable at 8.0 today on 10/19. Iron level low- supplement started. 10. Hypernatremia/: Added water flushes. May need low sodium formula.   10/4- Na 144- doing OK  10/9- Na 151- rechecking in AM  10/11: 156- nephro consulted Responding to free H20 11. Neurogenic bladder: Maintain foley due to sacral wounds.. 12. Neurogenic bowel:Continue senna in am with suppository in evenings for bowel program. Moving bowels regularly   10/19: continue bowel program 13. Secretions/Trach/capped: continue scopolamine patch  10/15- con't trach, because still needing suctioning- pt refused today, but sounds like he needs it.  10/20: CXR is stable. Encourage IS  14. Azotemia/NPO- getting TFs  Prior- Na got up to 156 and BUN up to 58- but BUN was 42 and Na down to 147 as of 10/14  10/20: Na 140  15. W/C requirement- will need either head array OR chin control power w/c- have already discussed with w/c rep/company- they are working on getting a demo for him.   16. Hypokalemia  10/14- will give repletion of KCL today 40 mEq x1.    10/20: K+ 4.0 on 10/20 17. Spasticity  10/15- pt asking for spasticity meds- for annoying spasms- will try Baclofen 5 mg TID and see how he does.   18.  Retracted foreskin with chronic foley, no sign of ischemia, edema posterior foreskin, may be related to increased peripheral edema although no scrotal swelling - discussed with Uro on call Dr Sheppard Coil who felt that Uro eval was not needed at this time 19. Disposition: plan for family meeting on Thurs at 8:30am. Plan for D/c home.   LOS: 19 days A FACE TO FACE EVALUATION WAS PERFORMED  Clide Deutscher Marv Alfrey 07/12/2020, 12:48 PM

## 2020-07-12 NOTE — Telephone Encounter (Signed)
Aria from [facility] called in to see if provider would be able to manage pt's trach. He has had it since August and it is capped and unable to come out due to his secretions. She states that he is a new quad and she is trying to get therapy for him to go home. Please advise.

## 2020-07-12 NOTE — Progress Notes (Signed)
Pt return to unit per bed. Pt expresses no needs at this time.  Sheela Stack, LPN

## 2020-07-12 NOTE — Progress Notes (Signed)
The RT was notified due to patient having  Congested copious amount of thick whitish secretion suction with yaunkuer but increase rhonchi noted. RT arrived to department and suction large amount of thick tan/white secretion and inner cannula changed. Closely monitor for acute respiratory distress, remains on continuous O2 monitor.

## 2020-07-13 ENCOUNTER — Inpatient Hospital Stay (HOSPITAL_COMMUNITY): Payer: BC Managed Care – PPO | Admitting: *Deleted

## 2020-07-13 ENCOUNTER — Ambulatory Visit (HOSPITAL_COMMUNITY): Payer: BC Managed Care – PPO | Admitting: Physical Therapy

## 2020-07-13 ENCOUNTER — Encounter (HOSPITAL_COMMUNITY): Payer: BC Managed Care – PPO

## 2020-07-13 ENCOUNTER — Encounter (HOSPITAL_COMMUNITY): Payer: BC Managed Care – PPO | Admitting: Speech Pathology

## 2020-07-13 DIAGNOSIS — S14109S Unspecified injury at unspecified level of cervical spinal cord, sequela: Secondary | ICD-10-CM | POA: Diagnosis not present

## 2020-07-13 LAB — GLUCOSE, CAPILLARY
Glucose-Capillary: 100 mg/dL — ABNORMAL HIGH (ref 70–99)
Glucose-Capillary: 212 mg/dL — ABNORMAL HIGH (ref 70–99)
Glucose-Capillary: 168 mg/dL — ABNORMAL HIGH (ref 70–99)

## 2020-07-13 NOTE — Progress Notes (Addendum)
[facility] PHYSICAL MEDICINE & REHABILITATION PROGRESS NOTE   Subjective/Complaints: Family meeting held today- plan for d/c home wife's greatest concerns are trach and wound care and daughter would like to see wound today. They are also concerned about secretions.  Ryan Day has no complaints today  ROS:   Pt denies SOB, abd pain, CP, - N/V, + neurogenic bowel and bladder no BM without bowel program, has foley    Objective:   DG Chest 2 View  Result Date: 07/11/2020 CLINICAL DATA:  Congestion. EXAM: CHEST - 2 VIEW COMPARISON:  07/09/2020.  06/19/2020. FINDINGS: Tracheostomy tube in stable position. Mediastinum hilar structures stable. Stable cardiomegaly. No pulmonary venous congestion. Low lung volumes with mild bibasilar atelectasis/infiltrates again noted. Stable left base pleuroparenchymal thickening consistent with scarring. Degenerative change thoracic spine. IMPRESSION: 1. Tracheostomy tube in stable position. 2. Stable cardiomegaly. 3. Low lung volumes with mild bibasilar atelectasis/infiltrates again noted. Electronically Signed   By: Marcello Moores  Register   On: 07/11/2020 16:18   Recent Labs    07/11/20 0646  WBC 7.0  HGB 8.0*  HCT 25.2*  PLT 349   Recent Labs    07/12/20 0634  NA 140  K 4.0  CL 103  CO2 28  GLUCOSE 122*  BUN 29*  CREATININE 0.71  CALCIUM 9.3    Intake/Output Summary (Last 24 hours) at 07/13/2020 0854 Last data filed at 07/13/2020 7616 Gross per 24 hour  Intake 0 ml  Output 1800 ml  Net -1800 ml     Pressure Injury 06/23/20 Buttocks Right Unstageable - Full thickness tissue loss in which the base of the injury is covered by slough (yellow, tan, gray, green or brown) and/or eschar (tan, brown or black) in the wound bed. (Active)  06/23/20 1838  Location: Buttocks  Location Orientation: Right  Staging: Unstageable - Full thickness tissue loss in which the base of the injury is covered by slough (yellow, tan, gray, green or brown) and/or eschar  (tan, brown or black) in the wound bed.  Wound Description (Comments):   Present on Admission: Yes     Pressure Injury 06/23/20 Buttocks Left Unstageable - Full thickness tissue loss in which the base of the injury is covered by slough (yellow, tan, gray, green or brown) and/or eschar (tan, brown or black) in the wound bed. (Active)  06/23/20 1839  Location: Buttocks  Location Orientation: Left  Staging: Unstageable - Full thickness tissue loss in which the base of the injury is covered by slough (yellow, tan, gray, green or brown) and/or eschar (tan, brown or black) in the wound bed.  Wound Description (Comments):   Present on Admission: Yes    Physical Exam: Vital Signs Blood pressure 123/63, pulse 82, temperature 98.2 F (36.8 C), temperature source Oral, resp. rate 16, height 6\' 5"  (1.956 m), weight 105.2 kg, SpO2 95 %.   General: Alert and oriented x 3, No apparent distress HEENT: Head is normocephalic, atraumatic, PERRLA, EOMI, sclera anicteric, oral mucosa pink and moist, dentition intact, ext ear canals clear,  Neck: Supple without JVD or lymphadenopathy Heart: Reg rate and rhythm. No murmurs rubs or gallops Chest: CTA bilaterally without wheezes, rales, or rhonchi; no distress Abdomen: Soft, non-tender, non-distended, bowel sounds positive. Extremities: No clubbing, cyanosis, or edema. Pulses are 2+  Skin: PEG site with duoderm dressing, some crusting around tube   Skin:unstageable on sacrum- more slough is resolved (not all of it)- bright pink/granulating overall-    GU- foreskin retracted but reduceable, no tenderness, meatus  without blood or d/c Able to reduce but then retracts again  Ox3- quiet Motor: B/l UE shoulder abduction 1+/5, distally 0/5 with ?trace flicker left wrist flexor B/l LE: 0/5 proximal to distal Sensation diminished to light touch in all extremities      Assessment/Plan: 1. 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  Physiatrist is providing close team supervision and 24 hour management of active medical problems listed below.  Physiatrist and rehab team continue to assess barriers to discharge/monitor patient progress toward functional and medical goals  Care Tool:  Bathing              Bathing assist Assist Level: 2 Helpers     Upper Body Dressing/Undressing Upper body dressing   What is the patient wearing?: Pull over shirt    Upper body assist Assist Level: 2 Helpers    Lower Body Dressing/Undressing Lower body dressing      What is the patient wearing?: Incontinence brief, Pants     Lower body assist Assist for lower body dressing: 2 Helpers     Toileting Toileting    Toileting assist Assist for toileting: Dependent - Patient 0%     Transfers Chair/bed transfer  Transfers assist  Chair/bed transfer activity did not occur: Safety/medical concerns  Chair/bed transfer assist level: Dependent - mechanical lift     Locomotion Ambulation   Ambulation assist   Ambulation activity did not occur: Safety/medical concerns          Walk 10 feet activity   Assist           Walk 50 feet activity   Assist           Walk 150 feet activity   Assist           Walk 10 feet on uneven surface  activity   Assist           Wheelchair     Assist Will patient use wheelchair at discharge?: Yes Type of Wheelchair: Power Wheelchair activity did not occur: Safety/medical concerns  Wheelchair assist level: Moderate Assistance - Patient 50 - 74% Max wheelchair distance: 10'    Wheelchair 50 feet with 2 turns activity    Assist    Wheelchair 50 feet with 2 turns activity did not occur: Safety/medical concerns       Wheelchair 150 feet activity     Assist  Wheelchair 150 feet activity did not occur: Safety/medical concerns       Blood pressure  123/63, pulse 82, temperature 98.2 F (36.8 C), temperature source Oral, resp. rate 16, height 6\' 5"  (1.956 m), weight 105.2 kg, SpO2 95 %.    Medical Problem List and Plan: 1.  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  Antithrombotics: -DVT/anticoagulation:  Pharmaceutical: Lovenox--check dopplers in am.  10/5- Dopplers (-)  10/10- needs for 3 months from starting it             -antiplatelet therapy: N/A  3. Pain Management: N/A 4. Mood: LCSW to follow for evaluation and support.              -antipsychotic agents: N/A   Neuropsych following  5. Neuropsych: This patient is capable of making decisions on his own behalf. 6. Sacral decub/Skin/Wound Care: Pictures show wound in different stages of healing. Ordered Air mattress overlay. Continue to reposition q2H. Continue vitamin and prostat to promote healing. Will add Juven also for collagen. Will increase tube feed to 50 cc/hr as likely has higher nutritional need. Will consult dietician to help address nutritional needs.   10/4- will consult dietician- BUN up to 54 from 27 and Cr 1.19 from <1- asked to evaluate dryness  10/5- haven't seen a dietician note- will check on this.  10/9- less slough, so will ask WOC to see if can change to silver alginate, or something like that.  Also ordered neosporin for new wound around Urethral meatus  10/10- waiting for wound care to see- since weekend.   10/11: appreciate WOC eval and recs  10/21: please provide wound care instructions to wife and daughter today.  7. Fluids/Electrolytes/Nutrition: Monitor I/Os.CMP ordered. Transition to bolus tube feeds next week if stable.   Hypernatremia dn pre rennal azotemia- nephro  8. Orthostatic Hypotension: Secondary to autonomic dysfunction has resolved. BP soft to  110/55.               Vitals:   07/13/20 0416 07/13/20 0529  BP:  123/63  Pulse: 80 82  Resp: 16 16  Temp:  98.2 F (36.8 C)  SpO2:  95%   9. Acute on chronic anemia: Improving--continue to monitor. Added iron supplement.              Hgb stable 10/11  10/18: dropped to 8- repeat tomorrow with iron level  10/19: Hgb stable at 8.0 today on 10/19. Iron level low- supplement started. 10. Hypernatremia/: Added water flushes. May need low sodium formula.   10/4- Na 144- doing OK  10/9- Na 151- rechecking in AM  10/11: 156- nephro consulted Responding to free H20 11. Neurogenic bladder: Maintain foley due to sacral wounds.. 12. Neurogenic bowel:Continue senna in am with suppository in evenings for bowel program. Moving bowels regularly   10/19: continue bowel program 13. Secretions/Trach/capped: continue scopolamine patch  10/15- con't trach, because still needing suctioning- pt refused today, but sounds like he needs it.  10/20: CXR is stable. Encourage IS. Placed order for nursing to help with IS during the day.  14. Azotemia/NPO- getting TFs  Prior- Na got up to 156 and BUN up to 58- but BUN was 42 and Na down to 147 as of 10/14  10/20: Na 140 15. W/C requirement- will need either head array OR chin control power w/c- have already discussed with w/c rep/company- they are working on getting a demo for him.   16. Hypokalemia  10/14- will give repletion of KCL today 40 mEq x1.    10/20: K+ 4.0 on 10/20 17. Spasticity  10/15- pt asking for spasticity meds- for annoying spasms- will try Baclofen 5 mg TID and see how he does.    10/21: improving 18.  Retracted foreskin with chronic foley, no sign of ischemia, edema posterior foreskin, may be related to increased peripheral edema although no scrotal swelling - discussed with Uro on call Dr Sheppard Coil who felt that Uro eval was not needed at this time 65. Disposition: family meeting held today- goal is DC home. Discussed barriers  with wife  and daughters. Given his quadriparesis, Ryan Day requires a hospital bed with hoyer lift, low airloss mattress, and a power wheelchair.     >35 minutes spent in discussion with family, therapy, nursing on trach care, secretions, wound care, disposition barriers LOS: 20 days A FACE TO FACE EVALUATION WAS PERFORMED  Ryan Day Ryan Day 07/13/2020, 8:54 AM

## 2020-07-13 NOTE — Progress Notes (Signed)
Occupational Therapy Session Note  Patient Details  Name: Ryan Day MRN: [id number] Date of Birth: 10-17-1950  Today's Date: 07/13/2020 OT Individual Time: 1000-1030 OT Individual Time Calculation (min): 30 min  and Today's Date: 07/13/2020 OT Missed Time: 30 Minutes Missed Time Reason: Patient unwilling/refused to participate without medical reason   Short Term Goals: Week 3:  OT Short Term Goal 1 (Week 3): Pt will roll B in bed with MOD +2 A OT Short Term Goal 2 (Week 3): Pt will direct functional transfers with max VC OT Short Term Goal 3 (Week 3): Pt will maintain static sitting balnace wiht MAX A of 1 caregiver for 3 min at EOB/EOM  Skilled Therapeutic Interventions/Progress Updates:    Pt resting in bed upon arrival with wife, daughter, and CSW present. Attempted to assist pt in preparation for transfer to w/c for family education.  Pt engaging in discussion regarding discharge plans. Pt requested that he not get OOB and after assisting pt with positioning, pt requested that he not do anything else because he needed to work through some things with family and CSW. Pt missed 30 mins skilled OT services.  Therapy Documentation Precautions:  Precautions Precautions: Cervical Precaution Booklet Issued: No Precaution Comments: C collar on when egde of bed, upright or OOB Required Braces or Orthoses: Cervical Brace Cervical Brace: Hard collar, Other (comment) (C collar when edge of bed, upright or OOB) Restrictions Weight Bearing Restrictions: No General: General OT Amount of Missed Time: 30 Minutes   Pain:  Pt denies pain   Therapy/Group: Individual Therapy  [name] 07/13/2020, 12:12 PM

## 2020-07-13 NOTE — Progress Notes (Signed)
Patient ID: Ryan Day, male   DOB: Aug 13, 1951, 69 y.o.   MRN: [id number]   Patient/Family Conference  Patient/family in attendance: Ryan Day (daughter), wife Ryan Day, and Optician, dispensing (Dietitian)  Staff in attendance: Dr. Leeroy Cha, Leotis Shames (OT), Raeanne Barry (PT), Nadara Mode (SLP), [name] (SW)  Main focus: Discuss patient care needs at discharge.   Synopsis of information shared: Patient will require total care. Pt will need a home health agency to provide therapies, and working on a provider willing to manage his trach and wound care needs. Pt will require ambulance transportation to appointments. Discussion on DME needs, and continued family education through discharge with therapies and with nursing for: wound care, trach care, and TF.   Barriers/concerns expressed by patient and family: Finding a home health agency to accept pt with his level of care needs due to limited staffing, and insurance.   Patient/family response: Family would like to take patient home and provide his care needs. Two of his daughters are nurses and willing to provide assistance outside of their work schedule. His wife is willing to provide 24/7 care. Pt and wife recognize his wife will be the primary caregiver. Family would like to avoid nursing home placement.   Follow-up/action plans: SW will continue to explore home health agencies for additional therapies, and providers that are willing to manage trach/wound care needs. Pt wife will continue to come in for his duration of his stay to complete family education needs.    [name], MSW, [place] Office: [phone] Cell: [phone] Fax: [phone]

## 2020-07-13 NOTE — Progress Notes (Signed)
Occupational Therapy Note  Patient Details  Name: Ryan Day MRN: [id number] Date of Birth: 11-05-1950  Today's Date: 07/13/2020 OT Missed Time: 26 Minutes Missed Time Reason: Patient fatigue  Pt resting in bed upon arrival. Pt declined getting OOB. Offered to place bed in chair position but pt declined. Pt stated he had experienced a "roller coaster day" with all the decisions and changes in possible disposition at discharge. Pt missed 30 mins skilled OT services 2/2 fatigue.    Leotis Shames [facility] 07/13/2020, 2:12 PM

## 2020-07-13 NOTE — Progress Notes (Signed)
Occupational Therapy Session Note  Patient Details  Name: Ryan Day MRN: [id number] Date of Birth: March 16, 1951  Today's Date: 07/13/2020 OT Individual Time: 4383-8184 OT Individual Time Calculation (min): 10 min    Short Term Goals: Week 3:  OT Short Term Goal 1 (Week 3): Pt will roll B in bed with MOD +2 A OT Short Term Goal 2 (Week 3): Pt will direct functional transfers with max VC OT Short Term Goal 3 (Week 3): Pt will maintain static sitting balnace wiht MAX A of 1 caregiver for 3 min at EOB/EOM  Skilled Therapeutic Interventions/Progress Updates:    Family conference with wife and daughters (1 daughter via [place]), CSW, PT, SLP, and OTA. Answered questions by family members. OTA discussed positioning, importance of pt advocating for himself, self care.  Therapy Documentation Precautions:  Precautions Precautions: Cervical Precaution Booklet Issued: No Precaution Comments: C collar on when egde of bed, upright or OOB Required Braces or Orthoses: Cervical Brace Cervical Brace: Hard collar, Other (comment) (C collar when edge of bed, upright or OOB) Restrictions Weight Bearing Restrictions: No   Pain:  Pt denies pain   Therapy/Group: Individual Therapy  [name] 07/13/2020, 10:56 AM

## 2020-07-13 NOTE — Progress Notes (Addendum)
Patient ID: Ryan Day, male   DOB: 06/25/1951, 69 y.o.   MRN: [id number]  SW followed up on family referral to explore [place] [facility] [phone]) to see if they would manage trach patients. SW was informed by clinic staff they do not manage trach patients.  *SW spoke Ryan Day/receptionoisit with Ryan Day [phone]) who states she spoke with [name]/pulomonigist who reported that he would be willing to manage patient trach Day needs. SW requested to speak with his assigned RN to discuss the level of maintenance that will be provided. Reports for SW to call clinic tomorrow to speak with his RN.  SW received updates from OT therapist that the family is considering SNF placement. SW provided SNF list from Ryan Day provider list. SW informed on locations that have extended bed offers. Pt and family accept [facility] as an option for placement. Wife would like SW to continue to explore [facility] as an options as well to see if there are any changes. SW encouraged family to explore Ryan Day in efforts to reduce caregiver burden, and stress on patient to get him to appointments.    SW left message for Ryan Day/Admissions with Ryan Day [phone]) to discuss referral.  *SW received return phone call from Ryan Day to discuss referral,and inform on pt primary insurance [employer] [place] as primary. Reports she will need to speak with business office to get approval if able too accept.  Ryan Day called back to report business office accepted pt insurance. She states she will submit to insurance for authorization approval today. SW waiting on follow-up.   SW waiting on updates from Ryan Day on updates if able to accept referral. Ryan Day/[facility] declined referral as [employer] not accepting any new referrals.   SW met with pt in room to provide above updates. Pt states he does not want to go to [facility] due to location. SW discussed with him  again it being a short term option until the home is prepared. Pt is undecided. SW called pt wife Ryan Day to discuss. She reports he said he does not want to go SNF and will support his decision. SW informed will follow-up once more information. SW spoke with Ryan Day/ADmissions with Ryan Day to inform on above. Will submit information to insurance to see if insurance will approve.   SW spoke with Ryan Day/[facility] ([phone]/f:[phone]) to inform on possible SNF. SW to update on Monday on d/c details. SW requested delivery of power w/c by Tuesday or Wednesday. SW faxed clinical documentation to support power wheelchair to [facility].   SW submitted order for portable suction, Day bed and air loss mattress, and sling to [facility] via [facility]ute.   *There is no home health agency to manage his Day needs due to trach even if not managing this Day need. SW to discuss with family.   Declined [facility]: Ryan Day Day-insurance; 10/21- declined due to trach. Ryan Day at [employer] and Day needs; 10/20-still unable to accept due to staffing Ryan Day- insurance/out of network; 10/20- [facility]. SW waiting on updates if able to accept referral even with [facility]. Ryan Day Day-not in network/no contract [place]- insurance not in network; 10/20-remains declined Ryan Day- due to staffing Ryan Day- due to insurance; 10/21- declined due to Day needs Ryan [facility]- does not service area Ryan Day- staffing Ryan Day Intake Dept - declined due to staffing Ryan Day- declined due to [facility] branch  not accepting any referrals Ryan Day- does not provide any skilled therapies and are private duty only.   Ryan Day, MSW, [place] Office: [phone] Cell: [phone] Fax: [phone]

## 2020-07-13 NOTE — Progress Notes (Signed)
Pt had small brown BM with rectal sweep. Suppository administered and results await at this time.

## 2020-07-13 NOTE — Progress Notes (Signed)
Respiratory here at noon for trach teaching. Mother and daughter left. Education needs to be rescheduled.

## 2020-07-13 NOTE — Progress Notes (Signed)
Modified Barium Swallow Progress Note  Patient Details  Name: Ryan Day MRN: [id number] Date of Birth: [birth year]  Today's Date: 07/13/2020  Modified Barium Swallow completed.  Full report located under Chart Review in the Imaging Section.  Brief recommendations include the following:  Clinical Impression    Pt presents with moderate-severe pharyngeal dysphagia marked by severe pharyngeal residue/stasis and reduced UES opening. Pt demonstrated no deficits in mentation and oral phase was functional, as previously reported, but swallow function was largely consistent from most recent MBS. Pt demonstrated swallow initiated at the pyriform sinuses for thin and nectar thick liquids. Pharyngeal phase was marked by delay in swallow initiation, reduced base of tongue retraction, and limited hyolaryngeal elevation/excursion impacting UES opening. Pt did achieve full epiglottic inversion and laryngeal vestibule closure, however do to the deficits listed above penetration occurred during and after the swallow. Penetration (PAS 2, PAS 3, PAS 4 and PAS 5) occurred with thin liquids during and after the swallow. Consumption of nectar thick liquids resulted in only penetration (PAS 2) during the swallow, however the increase of viscosity resulted in increased pyriform residue. Increase viscosity of nectar thick liquids and use of reclined position (approximately 60 and 45 degrees) did allow for increase UES opening, however moderate residue remained in pyriform sinuses. Compensatory strategies (effortful swallow, dry swallow, and reclined positioning), were ineffective in reducing and clearing residue. Cervical precautions did not allow use of compensatory chin tuck or head turn/tilt strategy at this time. No occurrence of aspiration were noted on today's study, however due to the large amounts of residue that remained in the pyriform sinus and mild residue in the valleculea; SLP is concerned with lack of bolus  transportation leading to accumulation and eventually aspiration. 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 SLP continues to recommend NPO status at this time due to risk of aspiration further impacted by medical fragility. SLP recommends continuing water protocol in conservative amounts following oral care, as well as monitoring lung function and vital signs for s/s of aspiration pneumonia. Pt would continue to benefit from pharyngeal and respiratory strength exercises prior to repeat MBS.    Swallow Evaluation Recommendations       SLP Diet Recommendations: NPO;Ice chips PRN after oral care       Medication Administration: Via alternative means               Oral Care Recommendations: Oral care QID   Other Recommendations: Remove water pitcher;Have oral suction available    Eimy Plaza 07/13/2020,10:07 AM

## 2020-07-13 NOTE — Progress Notes (Signed)
Speech Language Pathology Daily Session Note  Patient Details  Name: Ryan Day MRN: [id number] Date of Birth: 12-23-1950  Today's Date: 07/13/2020 SLP Individual Time: 1100-1200 SLP Individual Time Calculation (min): 60 min  Short Term Goals: Week 3: SLP Short Term Goal 1 (Week 3): Patient will achieve and maintain adequate vocal intensity during trials and use of voice-activated adaptive equipement. SLP Short Term Goal 2 (Week 3): Patient will participate in trials of voice-activated and controlled devices with SLP. SLP Short Term Goal 3 (Week 3): Patient will consume PO trials of ice chips/small controlled water sips with minimal incidence of overt s/s of aspiration or penetration. SLP Short Term Goal 4 (Week 3): Patient will perform EMST and swallow strenghening exercises with setup A and supervisionA  Skilled Therapeutic Interventions:   Patient seen with wife and daughter present for initial 20 minutes, for skilled ST session focusing on swallow function and completing education. Patient consumed approximately 4 ounces ice chips with minimal overt throat clearing. He performed 3 reps of 10x for EMST with SLP increasing from 7 to 9. Patient also demonstrated use of incentive spirometer. Patient asking if he will have another swallow test before leaving and SLP plans to discuss with SLP team to determine benefit of this. Patient continues to benefit from skilled SLP intervention to maximize voice and swallow function prior to discharge.  Pain Pain Assessment Pain Scale: 0-10 Pain Score: 0-No pain  Therapy/Group: Individual Therapy  Sonia Baller, MA, CCC-SLP Speech Therapy

## 2020-07-13 NOTE — Progress Notes (Signed)
Physical Therapy Session Note  Patient Details  Name: Ryan Day MRN: [id number] Date of Birth: 1950-10-14  Today's Date: 07/13/2020 PT Individual Time: 0850-1000 PT Individual Time Calculation (min): 70 min   Short Term Goals: Week 1:  PT Short Term Goal 1 (Week 1): Pt will begin to instruct caregiver/staff in bed mobility and transfers. PT Short Term Goal 1 - Progress (Week 1): Progressing toward goal PT Short Term Goal 2 (Week 1): Pt will increase sitting tolerance on edge of bed to 30 minutes assist with ADL/mobility PT Short Term Goal 2 - Progress (Week 1): Progressing toward goal PT Short Term Goal 3 (Week 1): Pt will increase sitting balance on edge of bed to mod to max A to assist with ADLs/mobility PT Short Term Goal 3 - Progress (Week 1): Progressing toward goal PT Short Term Goal 4 (Week 1): Pt will propel power w/c on level surfaces with S and verbal cues. PT Short Term Goal 4 - Progress (Week 1): Other (comment) (working towards obtaining proper equipment) Week 2:  PT Short Term Goal 1 (Week 2): Pt will begin instructing caregivers/staff in assisting him with bed mobility and transfers PT Short Term Goal 1 - Progress (Week 2): Progressing toward goal PT Short Term Goal 2 (Week 2): Pt will recall pressure relief schedule of every 30 min x 2 min PT Short Term Goal 2 - Progress (Week 2): Progressing toward goal PT Short Term Goal 3 (Week 2): Pt will initiate power w/c mobility PT Short Term Goal 3 - Progress (Week 2): Met Week 3:  PT Short Term Goal 1 (Week 3): =LTG due to ELOS  Skilled Therapeutic Interventions/Progress Updates:    pt received in bed, session started with family training and education with pt, pt's wife and daughter present and one daughter present via video chat and pt's care team present. Pt and family conferenced with team for pt's needs, DC planning, recommendations from all staff with PT addressing pt's goals for increasing direction of care from pt to  assisting persons, power WC mobility increased I, and tolerance OOB. After 10 mins of this with team conference, PT educated family on pt's needs with donning abdominal binder, B TED hose, and ace wraps for BP management, dependent level of care. [name] able to complete one LE and PT demonstrated with one LE. [name] also directed in rolling pt in bed for donning of abdominal binder, pt's wife unable to roll pt without second helper at that time and benefited from [facility] in addition to physical assist from PT to complete roll. [name] also benefited from cues for abdominal binder placement and technique with navigating PEG tube with binder. Nursing present as well for educated with needs/questions from family as needed. [name] expressed many concerns with trach care, wound care, medication passing to pt once home, PEG management, bowel and bladder program, and assistance with physical care of pt. PT instructed wife to continue to ask all questions to medical staff as needed and continue to participate in trainings, recommended to set up a home schedule for all caregivers for when to complete all tasks and care for pt and what is needed to be done. At this time point in treatment, pt's wife requested more information about skilled nursing placement for short term rehab if possible and pt present throughout and agreeable to this for more information. End of session, pt left in bed, All needs in reach and in good condition. Call light in hand.  Family present and PT updated case manager on info requested by family and pt.   Therapy Documentation Precautions:  Precautions Precautions: Cervical Precaution Booklet Issued: No Precaution Comments: C collar on when egde of bed, upright or OOB Required Braces or Orthoses: Cervical Brace Cervical Brace: Hard collar, Other (comment) (C collar when edge of bed, upright or OOB) Restrictions Weight Bearing Restrictions: No    Vital Signs: Therapy Vitals Pulse  Rate: 75 Resp: 20 BP: 132/67 Patient Position (if appropriate): Lying Oxygen Therapy SpO2: 99 % O2 Device: Room Air    Therapy/Group: Individual Therapy  Junie Panning 07/13/2020, 2:42 PM

## 2020-07-13 NOTE — Progress Notes (Signed)
Speech Language Pathology Daily Session Note  Patient Details  Name: Ryan Day MRN: [id number] Date of Birth: May 08, 1951  Today's Date: 07/13/2020 SLP Individual Time: 1093-2355 SLP Individual Time Calculation (min): 10 min  Short Term Goals: Week 3: SLP Short Term Goal 1 (Week 3): Patient will achieve and maintain adequate vocal intensity during trials and use of voice-activated adaptive equipement. SLP Short Term Goal 2 (Week 3): Patient will participate in trials of voice-activated and controlled devices with SLP. SLP Short Term Goal 3 (Week 3): Patient will consume PO trials of ice chips/small controlled water sips with minimal incidence of overt s/s of aspiration or penetration. SLP Short Term Goal 4 (Week 3): Patient will perform EMST and swallow strenghening exercises with setup A and supervisionA  Skilled Therapeutic Interventions:   Patient seen with spouse and daughter present and one daughter present via video call for education in preparation for anticipated discharge home with family next week. SLP discussed patient's current swallow function, aspiration risks, showed and described recent film from [facility] and emphasized the importance of oral care and continuing to trial ice chips and perform swallow exercises.   Pain Pain Assessment Pain Scale: 0-10 Pain Score: 0-No pain  Therapy/Group: Individual Therapy  Sonia Baller, MA, CCC-SLP Speech Therapy

## 2020-07-13 NOTE — Progress Notes (Signed)
Visited with patient and left AD for patient to complete.  Family was visiting and will assist with AD.  Once complete patient will have Chaplain paged.  Will follow as needed.   Jaclynn Major, [place], [facility], Pager [phone]

## 2020-07-14 ENCOUNTER — Encounter (HOSPITAL_COMMUNITY): Payer: BC Managed Care – PPO | Admitting: Psychology

## 2020-07-14 ENCOUNTER — Inpatient Hospital Stay (HOSPITAL_COMMUNITY): Payer: BC Managed Care – PPO | Admitting: Speech Pathology

## 2020-07-14 ENCOUNTER — Inpatient Hospital Stay (HOSPITAL_COMMUNITY): Payer: BC Managed Care – PPO

## 2020-07-14 DIAGNOSIS — S14109S Unspecified injury at unspecified level of cervical spinal cord, sequela: Secondary | ICD-10-CM | POA: Diagnosis not present

## 2020-07-14 LAB — BASIC METABOLIC PANEL
Anion gap: 10 (ref 5–15)
BUN: 26 mg/dL — ABNORMAL HIGH (ref 8–23)
CO2: 27 mmol/L (ref 22–32)
Calcium: 9 mg/dL (ref 8.9–10.3)
Chloride: 102 mmol/L (ref 98–111)
Creatinine, Ser: 0.76 mg/dL (ref 0.61–1.24)
GFR, Estimated: >60 mL/min (ref >60)
Glucose, Bld: 122 mg/dL — ABNORMAL HIGH (ref 70–99)
Potassium: 4.1 mmol/L (ref 3.5–5.1)
Sodium: 139 mmol/L (ref 135–145)

## 2020-07-14 LAB — GLUCOSE, CAPILLARY
Glucose-Capillary: 120 mg/dL — ABNORMAL HIGH (ref 70–99)
Glucose-Capillary: 177 mg/dL — ABNORMAL HIGH (ref 70–99)
Glucose-Capillary: 219 mg/dL — ABNORMAL HIGH (ref 70–99)

## 2020-07-14 MED ORDER — SENNOSIDES 8.8 MG/5ML PO SYRP
5.0000 mL | ORAL_SOLUTION | Freq: Every day | ORAL | Status: DC
  Administered 2020-07-15 – 2020-07-20 (×6): 5 mL
  Filled 2020-07-14 (×6): qty 5

## 2020-07-14 MED ORDER — SENNOSIDES 8.8 MG/5ML PO SYRP
5.0000 mL | ORAL_SOLUTION | Freq: Once | ORAL | Status: AC
  Administered 2020-07-14: 5 mL via ORAL
  Filled 2020-07-14: qty 5

## 2020-07-14 MED ORDER — CALCIUM POLYCARBOPHIL 625 MG PO TABS
625.0000 mg | ORAL_TABLET | Freq: Every day | ORAL | Status: DC
  Administered 2020-07-15 – 2020-07-20 (×6): 625 mg
  Filled 2020-07-14 (×6): qty 1

## 2020-07-14 MED ORDER — SENNOSIDES 8.8 MG/5ML PO SYRP
15.0000 mL | ORAL_SOLUTION | Freq: Every day | ORAL | Status: DC
  Filled 2020-07-14: qty 15

## 2020-07-14 NOTE — Progress Notes (Signed)
Patient dig stimulation done as ordered rectal vault clear of stool.

## 2020-07-14 NOTE — Progress Notes (Signed)
This chaplain is attempting to coordinate services of the notary and witnesses to complete the Pt. AD.  At this time, [employer] do not have 3 witnesses.  The chaplain understands because the Pt. can not sign for himself, two volunteers are necessary for witnessing the Pt. designated signer, with whom will also need to be a volunteer. The Pt. and family are aware of the delay. The chaplain understands the Pt. Wife-Linda will be bedside on Tuesday to F/U on notarizing the Pt. AD.  The Pt. wife is his surrogate decision maker at this time. The chaplain understands the Pt. Wife-Linda will be his HCPOA. The Pt. has completed his Living Will and is waiting for notarization. The chaplain understands the Pt. doesn't want to use life prolonging measures in a life ending situation. The chaplain updated the Pt. RN-Karen.  The chaplain will F/U with spiritual care as needed.

## 2020-07-14 NOTE — Progress Notes (Signed)
[facility] [place] PHYSICAL MEDICINE & REHABILITATION PROGRESS NOTE   Subjective/Complaints: No complaints Neuropsych to speak with family today Productive family meeting yesterday- plan to d/c home.   ROS:  Pt denies SOB, abd pain, CP, - N/V, + neurogenic bowel and bladder no BM without bowel program, has foley    Objective:   No results found. No results for input(s): WBC, HGB, HCT, PLT in the last 72 hours. Recent Labs    07/12/20 0634 07/14/20 0455  NA 140 139  K 4.0 4.1  CL 103 102  CO2 28 27  GLUCOSE 122* 122*  BUN 29* 26*  CREATININE 0.71 0.76  CALCIUM 9.3 9.0    Intake/Output Summary (Last 24 hours) at 07/14/2020 0102 Last data filed at 07/14/2020 0800 Gross per 24 hour  Intake 0 ml  Output 3250 ml  Net -3250 ml     Pressure Injury 06/23/20 Buttocks Right Unstageable - Full thickness tissue loss in which the base of the injury is covered by slough (yellow, tan, gray, green or brown) and/or eschar (tan, brown or black) in the wound bed. (Active)  06/23/20 1838  Location: Buttocks  Location Orientation: Right  Staging: Unstageable - Full thickness tissue loss in which the base of the injury is covered by slough (yellow, tan, gray, green or brown) and/or eschar (tan, brown or black) in the wound bed.  Wound Description (Comments):   Present on Admission: Yes     Pressure Injury 06/23/20 Buttocks Left Unstageable - Full thickness tissue loss in which the base of the injury is covered by slough (yellow, tan, gray, green or brown) and/or eschar (tan, brown or black) in the wound bed. (Active)  06/23/20 1839  Location: Buttocks  Location Orientation: Left  Staging: Unstageable - Full thickness tissue loss in which the base of the injury is covered by slough (yellow, tan, gray, green or brown) and/or eschar (tan, brown or black) in the wound bed.  Wound Description (Comments):   Present on Admission: Yes    Physical Exam: Vital Signs Blood pressure (!) 112/59,  pulse 83, temperature 98.6 F (37 C), temperature source Oral, resp. rate 20, height 6\' 5"  (1.956 m), weight 105.2 kg, SpO2 96 %.  General: Alert and oriented x 3, No apparent distress HEENT: Head is normocephalic, atraumatic, PERRLA, EOMI, sclera anicteric, oral mucosa pink and moist, dentition intact, ext ear canals clear,  Neck: Supple without JVD or lymphadenopathy Heart: Reg rate and rhythm. No murmurs rubs or gallops Chest: CTA bilaterally without wheezes, rales, or rhonchi; no distress Abdomen: Soft, non-tender, non-distended, bowel sounds positive. Extremities: No clubbing, cyanosis, or edema. Pulses are 2+  Skin: PEG site with duoderm dressing, some crusting around tube   Skin:unstageable on sacrum- more slough is resolved (not all of it)- bright pink/granulating overall-    GU- foreskin retracted but reduceable, no tenderness, meatus without blood or d/c Able to reduce but then retracts again  Ox3- quiet Motor: B/l UE shoulder abduction 1+/5, distally 0/5 with ?trace flicker left wrist flexor B/l LE: 0/5 proximal to distal Sensation diminished to light touch in all extremities      Assessment/Plan: 1. 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  Physiatrist is providing close team supervision and 24 hour management of active medical problems listed below.  Physiatrist and rehab team continue to assess barriers to discharge/monitor patient progress toward functional and medical goals  Care Tool:  Bathing              Bathing assist Assist Level: 2 Helpers     Upper Body Dressing/Undressing Upper body dressing   What is the patient wearing?: Pull over shirt    Upper body assist Assist Level: 2 Helpers    Lower Body Dressing/Undressing Lower body dressing      What is the patient wearing?: Incontinence brief, Pants     Lower body assist Assist  for lower body dressing: 2 Helpers     Toileting Toileting    Toileting assist Assist for toileting: Dependent - Patient 0%     Transfers Chair/bed transfer  Transfers assist  Chair/bed transfer activity did not occur: Safety/medical concerns  Chair/bed transfer assist level: Dependent - mechanical lift     Locomotion Ambulation   Ambulation assist   Ambulation activity did not occur: Safety/medical concerns          Walk 10 feet activity   Assist           Walk 50 feet activity   Assist           Walk 150 feet activity   Assist           Walk 10 feet on uneven surface  activity   Assist           Wheelchair     Assist Will patient use wheelchair at discharge?: Yes Type of Wheelchair: Power Wheelchair activity did not occur: Safety/medical concerns  Wheelchair assist level: Moderate Assistance - Patient 50 - 74% Max wheelchair distance: 10'    Wheelchair 50 feet with 2 turns activity    Assist    Wheelchair 50 feet with 2 turns activity did not occur: Safety/medical concerns       Wheelchair 150 feet activity     Assist  Wheelchair 150 feet activity did not occur: Safety/medical concerns       Blood pressure (!) 112/59, pulse 83, temperature 98.6 F (37 C), temperature source Oral, resp. rate 20, height 6\' 5"  (1.956 m), weight 105.2 kg, SpO2 96 %.    Medical Problem List and Plan: 1.  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  Antithrombotics: -DVT/anticoagulation:  Pharmaceutical: Lovenox--check dopplers in am.  10/5- Dopplers (-)  10/10- needs for 3 months from starting it             -antiplatelet therapy: N/A                                                                 3. Pain Management: N/A 4. Mood: LCSW to follow for evaluation and support.               -antipsychotic agents: N/A   Neuropsych to speak with family today.  5. Neuropsych: This patient is capable of making decisions on his own behalf. 6. Sacral decub/Skin/Wound Care: Pictures show wound in different stages of healing. Ordered Air mattress overlay. Continue to reposition q2H. Continue vitamin  and prostat to promote healing. Will add Juven also for collagen. Will increase tube feed to 50 cc/hr as likely has higher nutritional need. Will consult dietician to help address nutritional needs.   10/4- will consult dietician- BUN up to 54 from 27 and Cr 1.19 from <1- asked to evaluate dryness  10/5- haven't seen a dietician note- will check on this.  10/9- less slough, so will ask WOC to see if can change to silver alginate, or something like that.  Also ordered neosporin for new wound around Urethral meatus  10/10- waiting for wound care to see- since weekend.   10/11: appreciate WOC eval and recs  10/21: please provide wound care instructions to wife and daughter today.  7. Fluids/Electrolytes/Nutrition: Monitor I/Os.CMP ordered. Transition to bolus tube feeds next week if stable.   Hypernatremia dn pre rennal azotemia- nephro   1022: Na has normalized.  8. Orthostatic Hypotension: Secondary to autonomic dysfunction has resolved. BP soft to 110/55. Has improved              Vitals:   07/14/20 0514 07/14/20 0524  BP: (!) 112/59   Pulse: 85 83  Resp: 18 20  Temp: 98.6 F (37 C)   SpO2: 93% 96%   9. Acute on chronic anemia: Improving--continue to monitor. Added iron supplement.              Hgb stable 10/11  10/18: dropped to 8- repeat tomorrow with iron level  10/19: Hgb stable at 8.0 today on 10/19. Iron level low- supplement started. 10. Hypernatremia/: Added water flushes. May need low sodium formula.   10/4- Na 144- doing OK  10/9- Na 151- rechecking in AM  10/11: 156- nephro consulted Responding to free H20 11. Neurogenic bladder: Maintain foley due to sacral wounds.. 12.  Neurogenic bowel:Continue senna in am with suppository in evenings for bowel program. Moving bowels regularly   10/19: continue bowel program 13. Secretions/Trach/capped: continue scopolamine patch  10/15- con't trach, because still needing suctioning- pt refused today, but sounds like he needs it.  10/20: CXR is stable. Encourage IS. Placed order for nursing to help with IS during the day.  14. Azotemia/NPO- getting TFs  Prior- Na got up to 156 and BUN up to 58- but BUN was 42 and Na down to 147 as of 10/14  10/20: Na 140 15. W/C requirement- will need either head array OR chin control power w/c- have already discussed with w/c rep/company- they are working on getting a demo for him.   16. Hypokalemia  10/14- will give repletion of KCL today 40 mEq x1.    10/20: K+ 4.0 on 10/20 17. Spasticity  10/15- pt asking for spasticity meds- for annoying spasms- will try Baclofen 5 mg TID and see how he does.    10/22: improving 18.  Retracted foreskin with chronic foley, no sign of ischemia, edema posterior foreskin, may be related to increased peripheral edema although no scrotal swelling - discussed with Uro on call Dr Sheppard Coil who felt that Uro eval was not needed at this time 58. Disposition: family meeting held today- goal is DC home. Discussed barriers with wife and daughters. Given his quadriparesis, Mr. Coger requires a hospital bed with hoyer lift, low airloss mattress, and a power wheelchair.     >35 minutes spent in discussion with family, therapy, nursing on trach care, secretions, wound care, disposition barriers LOS: 21 days A FACE TO FACE EVALUATION WAS PERFORMED  Clide Deutscher Kinberly Perris 07/14/2020, 9:54 AM

## 2020-07-14 NOTE — Progress Notes (Signed)
Physical Therapy Session Note  Patient Details  Name: Ryan Day MRN: [id number] Date of Birth: Aug 05, 1951  Today's Date: 07/14/2020 PT Individual Time: 1300-1415 PT Individual Time Calculation (min): 75 min   Short Term Goals: Week 3:  PT Short Term Goal 1 (Week 3): =LTG due to ELOS  Skilled Therapeutic Interventions/Progress Updates:    Patient in supine with wife present.  Discussed plan for up to w/c and initiated placing abdominal binder, but RN in to change dressing and educated wife how to assist.  Assisted nursing with rolling and holding during education and dressing change.  Placed binder and lift pad, but after placement noted maxisky lift not charged so rolled to remove sky lift pad and place maximove lift pad.  Noted pad not placed properly and wife became angry and stated this is not how it is supposed to go.  We had not placed it right.  Educated wife it doesn't always go as planned and that the pad needed adjustment and the adjustments would be made.  She apologized and sat and rested while pad readjusted and pt lifted to power w/c.  Time spent to adjust in w/c and adjust settings.  Assisted pt to dayroom in power chair.  Patient allowed to work with head controls and head controls adjusted to allow for turns and pt propelled with mod cues, close S to min A for safety over 70'.  Patient able to propel back to room but not to turn into room.  Assisted to be sitting near suction for pt to use when needed after coughing.  Patient left in w/c wife in the room and nursing staff aware to assist to bed at 3pm.    Therapy Documentation Precautions:  Precautions Precautions: Cervical Precaution Booklet Issued: No Precaution Comments: C collar on when egde of bed, upright or OOB Required Braces or Orthoses: Cervical Brace Cervical Brace: Hard collar, Other (comment) (C collar when edge of bed, upright or OOB) Restrictions Weight Bearing Restrictions: No Pain: Pain Assessment Pain  Scale: 0-10 Pain Score: 0-No pain    Therapy/Group: Individual Therapy  Reginia Naas  [place], PT 07/14/2020, 8:28 AM

## 2020-07-14 NOTE — Progress Notes (Signed)
Occupational Therapy Session Note  Patient Details  Name: Ryan Day MRN: [id number] Date of Birth: 03-15-1951  Today's Date: 07/14/2020 OT Individual Time: 1115-1200 OT Individual Time Calculation (min): 45 min    Short Term Goals: Week 1:  OT Short Term Goal 1 (Week 1): Pt will maintain static sitting balnace wiht MAX A of 1 caregiver for 3 min at EOB/EOM OT Short Term Goal 1 - Progress (Week 1): (P) Progressing toward goal OT Short Term Goal 2 (Week 1): Pt will direct functional transfers with max VC OT Short Term Goal 2 - Progress (Week 1): (P) Progressing toward goal OT Short Term Goal 3 (Week 1): (P) Pt will roll B in bed with MOD +2 A OT Short Term Goal 3 - Progress (Week 1): (P) Progressing toward goal  Skilled Therapeutic Interventions/Progress Updates:    1:1. Pt and wife present for family education. RN requesting pt stay in bed to review wound care with wife at end of session RN alerted to begin. Wife edu re donning teds/ace bandages during day time. OT does one leg and wife does another. Wife requires MIN VC for ACE wrapping but dons teds with setup. Set up voice access app, PUCK remote app, and reviewed demo of using voice access to turn on, open PUCK and change channel. Pt able to turn TV off/on, change channel/volume and mute TV. Wife provided with sheet of purchasing info for modular hose phone holder, PUCK and voice access. Pt would benefit from continued review. Exited session wih tpt saeted in bed, exit alarm on and call light in reach  Therapy Documentation Precautions:  Precautions Precautions: Cervical Precaution Booklet Issued: No Precaution Comments: C collar on when egde of bed, upright or OOB Required Braces or Orthoses: Cervical Brace Cervical Brace: Hard collar, Other (comment) (C collar when edge of bed, upright or OOB) Restrictions Weight Bearing Restrictions: No General:   Vital Signs: Therapy Vitals Temp: 98.6 F (37 C) Temp Source: Oral Pulse  Rate: 83 Resp: 20 BP: (!) 112/59 Patient Position (if appropriate): Lying Oxygen Therapy SpO2: 96 % O2 Device: Room Air FiO2 (%): 21 % Pain: Pain Assessment Pain Scale: Faces Faces Pain Scale: No hurt ADL: ADL Eating: NPO Grooming: Dependent Upper Body Bathing: Dependent Lower Body Bathing: Dependent Upper Body Dressing: Dependent Where Assessed-Upper Body Dressing: Bed level Lower Body Dressing: Dependent Where Assessed-Lower Body Dressing: Bed level Toileting: Dependent Where Assessed-Toileting: Bed level Vision   Perception    Praxis   Exercises:   Other Treatments:     Therapy/Group: Individual Therapy  Tonny Branch 07/14/2020, 6:59 AM

## 2020-07-14 NOTE — Progress Notes (Signed)
Occupational Therapy Weekly Progress Note  Patient Details  Name: Ryan Day MRN: [id number] Date of Birth: 01-17-1951  Beginning of progress report period: July 07, 2020 End of progress report period: July 14, 2020   Patient has met 1 of 3 short term goals.  Pt has made minimal functional gains during this past week but has become more vocal in advocating his needs and directing care.  Family education has been the primary focus during the past week.  [name] has been present during therapy sessions and has participated in pt's care. Pt's daughter has also been present.  Continue to recommend 24 hour care.  Care needs will required the assistance of two (2) caregivers. Family has verbalized understanding. Discharge plans have not been finalzied secondary to difficulty securing HH. Pt adamant that he wants to discharge home.  Family education continues.   Patient continues to demonstrate the following deficits: muscle weakness and muscle paralysis, decreased cardiorespiratoy endurance, unbalanced muscle activation and decreased sitting balance, decreased postural control and decreased balance strategies and therefore will continue to benefit from skilled OT intervention to enhance overall performance with BADL and Reduce care partner burden.  Patient progressing toward long term goals..  Continue plan of care.  OT Short Term Goals Week 3:  OT Short Term Goal 1 (Week 3): Pt will roll B in bed with MOD +2 A OT Short Term Goal 1 - Progress (Week 3): Progressing toward goal OT Short Term Goal 2 (Week 3): Pt will direct functional transfers with max VC OT Short Term Goal 2 - Progress (Week 3): Met OT Short Term Goal 3 (Week 3): Pt will maintain static sitting balnace wiht MAX A of 1 caregiver for 3 min at EOB/EOM OT Short Term Goal 3 - Progress (Week 3): Progressing toward goal Week 4:  OT Short Term Goal 1 (Week 4): STG=LTG 2/2 ELOS   Leotis Shames [facility] 07/14/2020, 6:36 AM

## 2020-07-14 NOTE — Progress Notes (Signed)
Patient ID: Ryan Day, male   DOB: 1950/10/25, 69 y.o.   MRN: [id number]  SW met with pt and pt wife to provide updates with inability to find a home health agency. Family would like to move forward with him discharging to home, and does not want him to transition to SNF or [facility]. While in room, SW spoke with a referral source the family found- [name] [place] [phone]) and discussed possible assistance with skilled nursing care needs for pt. Reports he is having his regional manager involved after speaking with the family, and would like to assist. SW informed there pt will not have additional home health therapies due to risk and/or insurance related reasons by home health agencies. SW to send over documents, and he intends to assist if able.   *SW faxed over clinical documentation to [name].   SW spoke with [name] to discuss pt need for trach care kits/supplies and enteral feeds. SW submitted enteral feed order to [facility] via parachute. SW spoke [name]/receptionoisit with [facility] [phone]) to speak with nursing staff with [name]. SW encouraged to call back on Monday. SW to follow-up then.   [name], MSW, [place] Office: [phone] Cell: [phone] Fax: [phone]

## 2020-07-14 NOTE — Consult Note (Addendum)
  Attempted to see patient and wife today to follow-up after family meeting yesterday.  Wife did not show up for our meeting.  Talked with pt some but his ability to express self is limited and he was not able to do much more than listen as I talked and give cues of understanding.  Addressed issues of needs for care post discharge home.  His wife will not be able to do all that he needs given his complete quad status.  Two daughters around but how much they will be able to help is not clear.  Will check back with patient's room to see if wife is around later this AM.  Update:  I was able to stop by pt's room again and wife was there.  Wife was in much better state today and the family session yesterday helped put the family together in planning how to take care of pt once he goes home.  3 daughters and 2 sons all plan to help and there is a friend of wife that is also nurse that will help wife when she is alone in turning patient.  Talked with wife and pt about palliative care consult and wife did not appear to fully understand what I was talking about.  Did suggest to wife that she should set up time to talk with palliative care.

## 2020-07-14 NOTE — Progress Notes (Signed)
Bowel Program: Digstim completed with rectal vault emptied with small brown stool, administered suppository...will continue with plan of care.

## 2020-07-14 NOTE — Progress Notes (Signed)
Speech Language Pathology Weekly Progress and Session Note  Patient Details  Name: Ryan Day MRN: [id number] Date of Birth: 02-21-1951  Beginning of progress report period: July 07, 2020 End of progress report period: July 14, 2020  Today's Date: 07/14/2020 SLP Individual Time: 1520-1550 SLP Individual Time Calculation (min): 30 min  Short Term Goals: Week 3: SLP Short Term Goal 1 (Week 3): Patient will achieve and maintain adequate vocal intensity during trials and use of voice-activated adaptive equipement. SLP Short Term Goal 1 - Progress (Week 3): Met SLP Short Term Goal 2 (Week 3): Patient will participate in trials of voice-activated and controlled devices with SLP. SLP Short Term Goal 2 - Progress (Week 3): Met SLP Short Term Goal 3 (Week 3): Patient will consume PO trials of ice chips/small controlled water sips with minimal incidence of overt s/s of aspiration or penetration. SLP Short Term Goal 3 - Progress (Week 3): Met SLP Short Term Goal 4 (Week 3): Patient will perform EMST and swallow strenghening exercises with setup A and supervisionA SLP Short Term Goal 4 - Progress (Week 3): Met    New Short Term Goals: Week 4: SLP Short Term Goal 1 (Week 4): STG's=LTG's (ELOS discharge 10/28)  Weekly Progress Updates:  Patient has made good progress and met all 4 of his short term goals. Voice has been improving in intensity but remains hoarse and low intensity overall. He is able to perform EMST with setup assistance and supervision and currently has been tolerating resistance of 56m. Ice chips continue to be tolerated with minimal intensity of overt s/s of aspiration or penetration however patient continues with difficulty in expectorating pharyngeal secretions. MBS on 10/20 did not show any aspiration but was not significantly changed from previous and continues to show buildup of pharyngeal residuals in vallecular and pyriform sinuses which increase aspiration risk.     Intensity: Minumum of 1-2 x/day, 30 to 90 minutes Frequency: 3 to 5 out of 7 days Duration/Length of Stay: 10/28 Treatment/Interventions: Cueing hierarchy;Dysphagia/aspiration precaution training;Therapeutic Exercise;Patient/family education   Daily Session  Skilled Therapeutic Interventions: Patient participated in EMST with setting at 978mresistance and then 1069mesistance. Patient performed total of 40 with rest breaks after every 10 reps. He performed vocal exercise of increasing vocal intensity and pitch with falsetto "eeee" as well as sustained phonation during yawn-sigh exercise. He was able to sustain gentle phonation for approximately 1.5-2 seconds each. He was intelligible at phrase level during spontaneous connected speech with SLP standing very close to him. Patient continues to benefit from skilled SLP intervention to maximize swallow, speech/voice function prior to discharge.    General    Pain Pain Assessment Pain Scale: 0-10 Pain Score: 0-No pain  Therapy/Group: Individual Therapy   JohSonia BallerA, CCC-SLP Speech Therapy

## 2020-07-14 NOTE — Progress Notes (Signed)
Speech Language Pathology Daily Session Note  Patient Details  Name: Ryan Day MRN: [id number] Date of Birth: August 08, 1951  Today's Date: 07/14/2020 SLP Individual Time: 1030-1100 SLP Individual Time Calculation (min): 30 min  Short Term Goals: Week 3: SLP Short Term Goal 1 (Week 3): Patient will achieve and maintain adequate vocal intensity during trials and use of voice-activated adaptive equipement. SLP Short Term Goal 2 (Week 3): Patient will participate in trials of voice-activated and controlled devices with SLP. SLP Short Term Goal 3 (Week 3): Patient will consume PO trials of ice chips/small controlled water sips with minimal incidence of overt s/s of aspiration or penetration. SLP Short Term Goal 4 (Week 3): Patient will perform EMST and swallow strenghening exercises with setup A and supervisionA  Skilled Therapeutic Interventions:   Patient seen for skilled ST session focusing on swallow function goals with PO's of ice chips. Patient was not wanting to consume as many ice chips this morning as is usual for him and SLP did observe more frequent throat clearing prior to, during and after PO's of ice chips and this was likely mixture of secretions and water. Patient able to transit some secretions into oral cavity and suction out but continues with weak cough. Patient continues to benefit from skilled SLP intervention to maximize swallow, speech/voice function prior to discharge.  Pain Pain Assessment Pain Scale: 0-10 Pain Score: 0-No pain  Therapy/Group: Individual Therapy  Sonia Baller, MA, CCC-SLP Speech Therapy

## 2020-07-14 NOTE — Progress Notes (Signed)
Teaching today with medication administration through PEG tube, however wife wanted to just watch. Teaching by RRT with trach care and suction. Teaching regarding wound care and how to do wound care. Wife needs reinforcement and needs to perform activities.

## 2020-07-14 NOTE — Progress Notes (Signed)
This chaplain responded to MD consult for spiritual care. The chaplain understands the Pt. desires to complete an Advance Directive. The chaplain checked in with the Pt.RN before the visit.  Education was completed with the Pt. and  Pt. wife-Linda who is bedside. Advance Directive questions were answered and the Pt. is ready to notarize the document today. The chaplain will return with the notary and witnesses today.

## 2020-07-15 ENCOUNTER — Inpatient Hospital Stay (HOSPITAL_COMMUNITY): Payer: BC Managed Care – PPO | Admitting: Occupational Therapy

## 2020-07-15 ENCOUNTER — Inpatient Hospital Stay (HOSPITAL_COMMUNITY): Payer: BC Managed Care – PPO

## 2020-07-15 DIAGNOSIS — Z930 Tracheostomy status: Secondary | ICD-10-CM

## 2020-07-15 DIAGNOSIS — S14109S Unspecified injury at unspecified level of cervical spinal cord, sequela: Secondary | ICD-10-CM | POA: Diagnosis not present

## 2020-07-15 DIAGNOSIS — K592 Neurogenic bowel, not elsewhere classified: Secondary | ICD-10-CM

## 2020-07-15 DIAGNOSIS — R252 Cramp and spasm: Secondary | ICD-10-CM | POA: Diagnosis not present

## 2020-07-15 DIAGNOSIS — N319 Neuromuscular dysfunction of bladder, unspecified: Secondary | ICD-10-CM

## 2020-07-15 DIAGNOSIS — D649 Anemia, unspecified: Secondary | ICD-10-CM

## 2020-07-15 LAB — GLUCOSE, CAPILLARY
Glucose-Capillary: 110 mg/dL — ABNORMAL HIGH (ref 70–99)
Glucose-Capillary: 218 mg/dL — ABNORMAL HIGH (ref 70–99)

## 2020-07-15 NOTE — Progress Notes (Signed)
Bowel program completed this evening. Dig stim done x2. Large amount of soft stool resulted.

## 2020-07-15 NOTE — Progress Notes (Signed)
Occupational Therapy Session Note  Patient Details  Name: Ryan Day MRN: [id number] Date of Birth: 08-02-1951  Today's Date: 07/15/2020 OT Individual Time: 2423-5361  And 1446-1457 OT Individual Time Calculation (min): 43 min and 11 mins Missed 34 minutes d/t fatigue   Short Term Goals: Week 3:  OT Short Term Goal 1 (Week 3): Pt will roll B in bed with MOD +2 A OT Short Term Goal 1 - Progress (Week 3): Progressing toward goal OT Short Term Goal 2 (Week 3): Pt will direct functional transfers with max VC OT Short Term Goal 2 - Progress (Week 3): Met OT Short Term Goal 3 (Week 3): Pt will maintain static sitting balnace wiht MAX A of 1 caregiver for 3 min at EOB/EOM OT Short Term Goal 3 - Progress (Week 3): Progressing toward goal Week 4:  OT Short Term Goal 1 (Week 4): STG=LTG 2/2 ELOS  Skilled Therapeutic Interventions/Progress Updates:    Pt greeted at time of session semireclined in bed sleepy but easily awoken and agreeable to OT session. OT session focused on NMR for BUEs with PROM to prevent contractures and provide sensory feedback. Pt still in B resting hand splints, removed and therapist provided cleaning of palmar surface, between digits, etc. To prevent maceration/odor and skin build up, also provided sensory stimulation with tough towel to BUEs to decrease abnormal numbness/tingling sensation. PROM for shoulder, elbow, forearm, wrist, and digits for both UEs within his tolerance, R>L ROM. Therapist performed retrograde massage as well beginning distally and moving medially past knuckles, wrist, and to elbow. Called wife at end of session with voice activated feature on phone, set up with all needs met and sip/puff call accessible. All needs met.   Session 2: Pt greeted at time of session semireclined in bed sleeping but easily woken, stating he did not feel up to another therapy session. However, pt noted to be in poor position at bed level and dependently scooted up in bed with  boost feature. Facilitated upright sitting with bed features for pt to cough up phlegm, which he was able to cough without assist. BUEs noted to be swelling again, positioned on pillows for elevation and edema management. Pt continued to declined further OT session today, stating he was too tired. Pt in bed semireclined with sip/puff call bell accessible. Missed 34 mins of OT.  Therapy Documentation Precautions:  Precautions Precautions: Cervical Precaution Booklet Issued: No Precaution Comments: C collar on when egde of bed, upright or OOB Required Braces or Orthoses: Cervical Brace Cervical Brace: Hard collar, Other (comment) (C collar when edge of bed, upright or OOB) Restrictions Weight Bearing Restrictions: No     Therapy/Group: Individual Therapy  Viona Gilmore 07/15/2020, 11:47 AM

## 2020-07-15 NOTE — Progress Notes (Signed)
Speech Language Pathology Daily Session Note  Patient Details  Name: Ryan Day Age MRN: [id number] Date of Birth: 04-Aug-1951  Today's Date: 07/15/2020 SLP Individual Time: 1572-6203 SLP Individual Time Calculation (min): 44 min  Short Term Goals: Week 4: SLP Short Term Goal 1 (Week 4): STG's=LTG's (ELOS discharge 10/28)  Skilled Therapeutic Interventions:   Skilled SLP intervention focused on voice and swallowing. Demonstrated low intensity and intermittent aphonia with voice but able to increase volume with verbal cues. Pt completed EMST exercises x25 with 3 breaks at 34mL of resistance. Pt sustained phonation 2-3 seconds with sustained "ah" and moderate verbal cues to increase breath support. Oral care completed with suctioning prior to po trials. Trials completed with ice chips x5. Wet voice noted x1 but eventually cleared with verbal cue to clear throat x3 and reswallow. Pt left seated upright in bed with bed alarm set and call bell within reach.   Pain Pain Assessment Pain Scale: Faces Faces Pain Scale: No hurt  Therapy/Group: Individual Therapy  Ryan Day 07/15/2020, 9:40 AM

## 2020-07-15 NOTE — Progress Notes (Signed)
Ryan Day PHYSICAL MEDICINE & REHABILITATION PROGRESS NOTE   Subjective/Complaints: Patient seen sitting up in bed this morning.  He states he slept well overnight.  He is still sleepy this morning.  Successful bowel program yesterday evening.  ROS: Denies CP, SOB, N/V/D  Objective:   No results found. No results for input(s): WBC, HGB, HCT, PLT in the last 72 hours. Recent Labs    07/14/20 0455  NA 139  K 4.1  CL 102  CO2 27  GLUCOSE 122*  BUN 26*  CREATININE 0.76  CALCIUM 9.0    Intake/Output Summary (Last 24 hours) at 07/15/2020 1110 Last data filed at 07/15/2020 0805 Gross per 24 hour  Intake 0 ml  Output 1650 ml  Net -1650 ml     Pressure Injury 06/23/20 Buttocks Right Unstageable - Full thickness tissue loss in which the base of the injury is covered by slough (yellow, tan, gray, green or brown) and/or eschar (tan, brown or black) in the wound bed. (Active)  06/23/20 1838  Location: Buttocks  Location Orientation: Right  Staging: Unstageable - Full thickness tissue loss in which the base of the injury is covered by slough (yellow, tan, gray, green or brown) and/or eschar (tan, brown or black) in the wound bed.  Wound Description (Comments):   Present on Admission: Yes     Pressure Injury 06/23/20 Buttocks Left Unstageable - Full thickness tissue loss in which the base of the injury is covered by slough (yellow, tan, gray, green or brown) and/or eschar (tan, brown or black) in the wound bed. (Active)  06/23/20 1839  Location: Buttocks  Location Orientation: Left  Staging: Unstageable - Full thickness tissue loss in which the base of the injury is covered by slough (yellow, tan, gray, green or brown) and/or eschar (tan, brown or black) in the wound bed.  Wound Description (Comments):   Present on Admission: Yes    Physical Exam: Vital Signs Blood pressure 106/66, pulse 84, temperature 97.6 F (36.4 C), resp. rate 20, height 6\' 5"  (1.956 m), weight 107.6 kg,  SpO2 95 %. Constitutional: No distress . Vital signs reviewed. HENT: Normocephalic.  Atraumatic. Neck: Capped Trach. Eyes: EOMI. No discharge. Cardiovascular: No JVD.  RRR. Respiratory: Normal effort.  No stridor.  Bilateral clear to auscultation. GI: BS +.  + PEG. GU: + Foley. Skin: Warm and dry.  No lesions on visible skin. Psych: Normal mood.  Normal behavior. Musc: No edema in extremities.  No tenderness in extremities. Neuro: Alert  motor: B/l UE shoulder abduction 1+/5, distally 0/5 with ?trace flicker left wrist flexor B/l LE: 0/5 proximal to distal, appears unchanged  Assessment/Plan: 1. 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  Physiatrist is providing close team supervision and 24 hour management of active medical problems listed below.  Physiatrist and rehab team continue to assess barriers to discharge/monitor patient progress toward functional and medical goals  Care Tool:  Bathing              Bathing assist Assist Level: 2 Helpers     Upper Body Dressing/Undressing Upper body dressing   What is the patient wearing?: Pull over shirt    Upper body assist Assist Level: 2 Helpers    Lower Body Dressing/Undressing Lower body dressing      What is the patient wearing?: Incontinence brief, Pants     Lower body assist Assist for lower body dressing: 2 Helpers  Toileting Toileting    Toileting assist Assist for toileting: Dependent - Patient 0%     Transfers Chair/bed transfer  Transfers assist  Chair/bed transfer activity did not occur: Safety/medical concerns  Chair/bed transfer assist level: Dependent - mechanical lift     Locomotion Ambulation   Ambulation assist   Ambulation activity did not occur: Safety/medical concerns          Walk 10 feet activity   Assist           Walk 50 feet activity   Assist            Walk 150 feet activity   Assist           Walk 10 feet on uneven surface  activity   Assist           Wheelchair     Assist Will patient use wheelchair at discharge?: Yes Type of Wheelchair: Power Wheelchair activity did not occur: Safety/medical concerns  Wheelchair assist level: Minimal Assistance - Patient > 75% Max wheelchair distance: 77'    Wheelchair 50 feet with 2 turns activity    Assist    Wheelchair 50 feet with 2 turns activity did not occur: Safety/medical concerns   Assist Level: Minimal Assistance - Patient > 75%   Wheelchair 150 feet activity     Assist  Wheelchair 150 feet activity did not occur: Safety/medical concerns       Blood pressure 106/66, pulse 84, temperature 97.6 F (36.4 C), resp. rate 20, height 6\' 5"  (1.956 m), weight 107.6 kg, SpO2 95 %.    Medical Problem List and Plan: 1.  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  Antithrombotics: -DVT/anticoagulation:  Pharmaceutical: Lovenox 10/5- Dopplers (-)  10/10- needs for 3 months from starting it             -antiplatelet therapy: N/A                                                                 3. Pain Management: N/A 4. Mood: LCSW to follow for evaluation and support.              -antipsychotic agents: N/A  Appreciate neuropsych consult 5. Neuropsych: This patient is capable of making decisions on his own behalf. 6. Sacral decub/Skin/Wound Care: Pictures show wound in different stages of healing. Ordered Air mattress overlay. Continue to reposition q2H. Continue vitamin and prostat to promote healing. Will add Juven also for collagen.  Increased tube feed to 50 cc/hr as likely has higher nutritional need.  Consulted dietician to help address nutritional needs.  Appreciate WOC eval and recs 7. Fluids/Electrolytes/Nutrition: Monitor I/Os.   Hypernatremia dn pre rennal azotemia- nephro  8. Orthostatic Hypotension:  Secondary to autonomic dysfunction has resolved. BP soft to 110/55. Has improved              Vitals:   07/15/20 0634 07/15/20 0835  BP:    Pulse: 88 84  Resp: (!) 21 20  Temp:    SpO2: 95%    Stable on 10/23 9. Acute on chronic anemia: Improving--continue to monitor. Added iron supplement.              Hgb 8.2 on  10/19, labs ordered for Monday  Iron level low- supplement initiated 10. Hypernatremia: Added water flushes. May need low sodium formula.   Na 139 on 10/22  Responding to free H20 11. Neurogenic bladder: Maintain Foley due to sacral wounds.. 12. Neurogenic bowel: Continue senna in am with suppository in evenings for bowel program. Moving bowels regularly   Results of bowel program 13. Secretions/Trach/capped: continue scopolamine patch  Capped trach  10/20: CXR is stable.   Encourage IS.  14. Azotemia/NPO- getting TFs  Improving 15. W/C requirement- will need either head array OR chin control power w/c- w/c rep/company- working on getting a demo for him.   16. Hypokalemia  K+4.1 on 10/22 17. Spasticity  Baclofen 5 mg TID with benefit 18. Retracted foreskin with chronic foley, no sign of ischemia, edema posterior foreskin, may be related to increased peripheral edema although no scrotal swelling - Uro on call Dr Sheppard Coil, felt that Uro eval was not needed at this time 19. Disposition: family meeting held - goal is DC home. Given his quadriparesis, Mr. Yaworski requires a hospital bed with hoyer lift, low airloss mattress, and a power wheelchair.   LOS: 22 days A FACE TO FACE EVALUATION WAS PERFORMED  Ryan Day Lorie Phenix 07/15/2020, 11:10 AM

## 2020-07-16 ENCOUNTER — Inpatient Hospital Stay (HOSPITAL_COMMUNITY): Payer: BC Managed Care – PPO | Admitting: Physical Therapy

## 2020-07-16 DIAGNOSIS — K592 Neurogenic bowel, not elsewhere classified: Secondary | ICD-10-CM | POA: Diagnosis not present

## 2020-07-16 DIAGNOSIS — E870 Hyperosmolality and hypernatremia: Secondary | ICD-10-CM

## 2020-07-16 DIAGNOSIS — N319 Neuromuscular dysfunction of bladder, unspecified: Secondary | ICD-10-CM | POA: Diagnosis not present

## 2020-07-16 DIAGNOSIS — S14109S Unspecified injury at unspecified level of cervical spinal cord, sequela: Secondary | ICD-10-CM | POA: Diagnosis not present

## 2020-07-16 DIAGNOSIS — K117 Disturbances of salivary secretion: Secondary | ICD-10-CM

## 2020-07-16 DIAGNOSIS — Z930 Tracheostomy status: Secondary | ICD-10-CM | POA: Diagnosis not present

## 2020-07-16 LAB — GLUCOSE, CAPILLARY
Glucose-Capillary: 98 mg/dL (ref 70–99)
Glucose-Capillary: 151 mg/dL — ABNORMAL HIGH (ref 70–99)

## 2020-07-16 NOTE — Progress Notes (Addendum)
Digital stimulation as ordered X 2  with large  amount of brown soft stool observed and rectal vault cleared of stool. Dressing cahnged and in place. Patient tolerated without difficulty. Will continue to monitor

## 2020-07-16 NOTE — Progress Notes (Addendum)
[facility] PHYSICAL MEDICINE & REHABILITATION PROGRESS NOTE   Subjective/Complaints: Patient seen laying in bed this morning.  He states he slept well overnight, discussed with nursing.  He denies complaints.   ROS: Denies CP, SOB, N/V/D  Objective:   No results found. No results for input(s): WBC, HGB, HCT, PLT in the last 72 hours. Recent Labs    07/14/20 0455  NA 139  K 4.1  CL 102  CO2 27  GLUCOSE 122*  BUN 26*  CREATININE 0.76  CALCIUM 9.0    Intake/Output Summary (Last 24 hours) at 07/16/2020 1139 Last data filed at 07/16/2020 0413 Gross per 24 hour  Intake --  Output 2275 ml  Net -2275 ml     Pressure Injury 06/23/20 Buttocks Right Unstageable - Full thickness tissue loss in which the base of the injury is covered by slough (yellow, tan, gray, green or brown) and/or eschar (tan, brown or black) in the wound bed. (Active)  06/23/20 1838  Location: Buttocks  Location Orientation: Right  Staging: Unstageable - Full thickness tissue loss in which the base of the injury is covered by slough (yellow, tan, gray, green or brown) and/or eschar (tan, brown or black) in the wound bed.  Wound Description (Comments):   Present on Admission: Yes     Pressure Injury 06/23/20 Buttocks Left Unstageable - Full thickness tissue loss in which the base of the injury is covered by slough (yellow, tan, gray, green or brown) and/or eschar (tan, brown or black) in the wound bed. (Active)  06/23/20 1839  Location: Buttocks  Location Orientation: Left  Staging: Unstageable - Full thickness tissue loss in which the base of the injury is covered by slough (yellow, tan, gray, green or brown) and/or eschar (tan, brown or black) in the wound bed.  Wound Description (Comments):   Present on Admission: Yes    Physical Exam: Vital Signs Blood pressure 135/65, pulse 76, temperature (!) 97.2 F (36.2 C), temperature source Axillary, resp. rate 18, height 6\' 5"  (1.956 m), weight 108.6 kg, SpO2  97 %.  Constitutional: No distress . Vital signs reviewed. HENT: Normocephalic.  Atraumatic. Neck: Capped trach Eyes: EOMI. No discharge. Cardiovascular: No JVD.  RRR. Respiratory: Normal effort.  No stridor.  Bilateral clear to auscultation. GI: Non-distended.  BS +.  + PEG Skin: Warm and dry.  PEG site CDI GU: + Foley Psych: Normal mood.  Normal behavior. Musc: No edema in extremities.  No tenderness in extremities. Neuro: Alert  Dysphonia Motor: B/l UE shoulder abduction 1+/5, distally 0/5 with ?trace flicker left wrist flexor, unchanged B/l LE: 0/5 proximal to distal, unchanged  Assessment/Plan: 1. 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  Physiatrist is providing close team supervision and 24 hour management of active medical problems listed below.  Physiatrist and rehab team continue to assess barriers to discharge/monitor patient progress toward functional and medical goals  Care Tool:  Bathing              Bathing assist Assist Level: 2 Helpers     Upper Body Dressing/Undressing Upper body dressing   What is the patient wearing?: Pull over shirt    Upper body assist Assist Level: 2 Helpers    Lower Body Dressing/Undressing Lower body dressing      What is the patient wearing?: Incontinence brief, Pants     Lower body assist Assist for lower body dressing: 2 Helpers  Toileting Toileting    Toileting assist Assist for toileting: Dependent - Patient 0%     Transfers Chair/bed transfer  Transfers assist  Chair/bed transfer activity did not occur: Safety/medical concerns  Chair/bed transfer assist level: Dependent - mechanical lift     Locomotion Ambulation   Ambulation assist   Ambulation activity did not occur: Safety/medical concerns          Walk 10 feet activity   Assist           Walk 50 feet  activity   Assist           Walk 150 feet activity   Assist           Walk 10 feet on uneven surface  activity   Assist           Wheelchair     Assist Will patient use wheelchair at discharge?: Yes Type of Wheelchair: Power Wheelchair activity did not occur: Safety/medical concerns  Wheelchair assist level: Minimal Assistance - Patient > 75% Max wheelchair distance: 39'    Wheelchair 50 feet with 2 turns activity    Assist    Wheelchair 50 feet with 2 turns activity did not occur: Safety/medical concerns   Assist Level: Minimal Assistance - Patient > 75%   Wheelchair 150 feet activity     Assist  Wheelchair 150 feet activity did not occur: Safety/medical concerns       Blood pressure 135/65, pulse 76, temperature (!) 97.2 F (36.2 C), temperature source Axillary, resp. rate 18, height 6\' 5"  (1.956 m), weight 108.6 kg, SpO2 97 %.    Medical Problem List and Plan: 1.  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  Antithrombotics: -DVT/anticoagulation:  Pharmaceutical: Lovenox 10/5- Dopplers (-)  10/10- needs for 3 months from starting it             -antiplatelet therapy: N/A                                                      3. Pain Management: N/A 4. Mood: LCSW to follow for evaluation and support.              -antipsychotic agents: N/A  Appreciate neuropsych consult  Appears stable on 10/24 5. Neuropsych: This patient is capable of making decisions on his own behalf. 6. Sacral decub/Skin/Wound Care: Pictures show wound in different stages of healing. Ordered Air mattress overlay. Continue to reposition q2H. Continue vitamin and prostat to promote healing. Will add Juven also for collagen.  Increased tube feed to 50 cc/hr as likely has higher nutritional need.  Consulted dietician to help address nutritional needs.  Appreciate WOC eval and recs 7. Fluids/Electrolytes/Nutrition: Monitor I/Os.  8.  Orthostatic Hypotension: Secondary to autonomic dysfunction has resolved.               Vitals:   07/16/20 0919 07/16/20 1113  BP:    Pulse:    Resp: 18 18  Temp:    SpO2:     Stable on 10/24 9. Acute on chronic anemia: Improving--continue to monitor. Added iron supplement.              Hgb 8.2 on 10/19, labs ordered for tomorrow  Iron level low- supplement initiated 10. Hypernatremia: Added water flushes.  May need low sodium formula.   Na 139 on 10/22  Responding to free H20 11. Neurogenic bladder: Maintain Foley due to sacral wounds.. 12. Neurogenic bowel: Continue senna in am with suppository in evenings for bowel program. Moving bowels regularly   Results with bowel program 13. Secretions/Trach/capped: continue scopolamine patch  Capped trach  10/20: CXR is stable.   Encourage IS.   Improved 14. Azotemia/NPO- getting TFs  Improving 15. W/C requirement- will need either head array OR chin control power w/c- w/c rep/company- working on getting a demo for him.   16. Hypokalemia  K+4.1 on 10/22 17. Spasticity  Baclofen 5 mg TID with benefit 18. Retracted foreskin with chronic foley, no sign of ischemia, edema posterior foreskin, may be related to increased peripheral edema although no scrotal swelling - Uro on call Dr Sheppard Coil, felt that Uro eval was not needed at this time 19. Disposition: family meeting held - goal is DC home. Given his quadriparesis, Mr. Stueve requires a hospital bed with hoyer lift, low airloss mattress, and a power wheelchair.   LOS: 23 days A FACE TO FACE EVALUATION WAS PERFORMED  Yarianna Varble Lorie Phenix 07/16/2020, 11:39 AM

## 2020-07-16 NOTE — Progress Notes (Signed)
Slept good. Bath given at [facility]. Incontinent of moderate amount of mushy stool. Foley patent.  Bilateral PRAFO's and WHO's applied. Quad cough X 1 this shift. Trach capped. O2 95-100% RA. Congested, productive and weak cough. Using [place] independently, attached to bed. Patrici Ranks A

## 2020-07-16 NOTE — Progress Notes (Signed)
Physical Therapy Session Note  Patient Details  Name: Ryan Day MRN: [id number] Date of Birth: 1951/03/31  Today's Date: 07/16/2020 PT Individual Time: 0800-0900 PT Individual Time Calculation (min): 60 min   Short Term Goals: Week 3:  PT Short Term Goal 1 (Week 3): =LTG due to ELOS  Skilled Therapeutic Interventions/Progress Updates:    Pt received seated in bed asleep, arousable and agreeable to PT session. No complaints of pain. Seated BP 117/69. Assisted pt with dependently donning BLE TEDs and ACE wrap. [name] then arrives to session, assisted with rolling pt L/R for placement of abdominal binder. Pt agreeable to sit up in chair position in bed. Assisted pt with dependently donning hard collar while in supine. Demonstrated to pt's wife how to safely don/doff collar and she is able to perform return demo. Supine BP 106/69 prior to chair position, 109/66 once up in chair position. No compliants of dizziness or lightheadedness. Discussed home setup with pt and wife and assisted wife with taking meaurements of PWC to determine which doorway best for fit into home. Pt left seated in bed in chair position with needs in reach, wife present.  Therapy Documentation Precautions:  Precautions Precautions: Cervical Precaution Booklet Issued: No Precaution Comments: C collar on when egde of bed, upright or OOB Required Braces or Orthoses: Cervical Brace Cervical Brace: Hard collar, Other (comment) (C collar when edge of bed, upright or OOB) Restrictions Weight Bearing Restrictions: No   Therapy/Group: Individual Therapy   Excell Seltzer, PT, DPT  07/16/2020, 10:13 AM

## 2020-07-17 ENCOUNTER — Inpatient Hospital Stay (HOSPITAL_COMMUNITY): Payer: BC Managed Care – PPO | Admitting: Physical Therapy

## 2020-07-17 ENCOUNTER — Inpatient Hospital Stay (HOSPITAL_COMMUNITY): Payer: BC Managed Care – PPO

## 2020-07-17 DIAGNOSIS — Z7189 Other specified counseling: Secondary | ICD-10-CM

## 2020-07-17 DIAGNOSIS — S14109S Unspecified injury at unspecified level of cervical spinal cord, sequela: Secondary | ICD-10-CM | POA: Diagnosis not present

## 2020-07-17 DIAGNOSIS — Z515 Encounter for palliative care: Secondary | ICD-10-CM

## 2020-07-17 LAB — CBC
HCT: 25.8 % — ABNORMAL LOW (ref 39.0–52.0)
Hemoglobin: 8.1 g/dL — ABNORMAL LOW (ref 13.0–17.0)
MCH: 28.4 pg (ref 26.0–34.0)
MCHC: 31.4 g/dL (ref 30.0–36.0)
MCV: 90.5 fL (ref 80.0–100.0)
Platelets: 348 10*3/uL (ref 150–400)
RBC: 2.85 MIL/uL — ABNORMAL LOW (ref 4.22–5.81)
RDW: 16.3 % — ABNORMAL HIGH (ref 11.5–15.5)
WBC: 7.7 10*3/uL (ref 4.0–10.5)
nRBC: 0.5 % — ABNORMAL HIGH (ref 0.0–0.2)

## 2020-07-17 LAB — BASIC METABOLIC PANEL
Anion gap: 9 (ref 5–15)
BUN: 25 mg/dL — ABNORMAL HIGH (ref 8–23)
CO2: 29 mmol/L (ref 22–32)
Calcium: 9.5 mg/dL (ref 8.9–10.3)
Chloride: 106 mmol/L (ref 98–111)
Creatinine, Ser: 0.65 mg/dL (ref 0.61–1.24)
GFR, Estimated: >60 mL/min (ref >60)
Glucose, Bld: 100 mg/dL — ABNORMAL HIGH (ref 70–99)
Potassium: 3.7 mmol/L (ref 3.5–5.1)
Sodium: 144 mmol/L (ref 135–145)

## 2020-07-17 LAB — GLUCOSE, CAPILLARY
Glucose-Capillary: 86 mg/dL (ref 70–99)
Glucose-Capillary: 147 mg/dL — ABNORMAL HIGH (ref 70–99)

## 2020-07-17 NOTE — Progress Notes (Signed)
Occupational Therapy Session Note  Patient Details  Name: Ryan Day MRN: [id number] Date of Birth: 1951-05-26  Today's Date: 07/17/2020 OT Individual Time: 0315-9458 OT Individual Time Calculation (min): 55 min    Short Term Goals: Week 4:  OT Short Term Goal 1 (Week 4): STG=LTG 2/2 ELOS  Skilled Therapeutic Interventions/Progress Updates:    Pt resting in TIS w/c upon arrival. OT intervention with focus on BUE stretching/PROM to maintain joint mobility. Pt with noted with limited wrist/digit flexion and wrist extension. Elbow and shoulder WNL. See below. HEP developed and discussed with pt. Will discuss with family when present. Discussed discharge plans and importance of advocating for self. Pt verbalizes understanding. Pt remained in w/c with suction and sip/puff call bell placed when pt can access.   Access Code: [email or web address] URL: [email or web address] Date: 07/17/2020 Prepared by: Leotis Shames  Exercises Elbow Flexion and Extension Caregiver PROM - 3 x daily - 7 x weekly - 3 sets - 10 reps - 5 secs hold Shoulder Flexion Caregiver PROM - 3 x daily - 7 x weekly - 3 sets - 10 reps - 5 secs hold Forearm Pronation and Supination Caregiver PROM - 3 x daily - 7 x weekly - 3 sets - 10 reps - 5 secs hold Shoulder Abduction Caregiver PROM - 3 x daily - 7 x weekly - 3 sets - 10 reps - 5 secs hold Wrist Flexion and Extension Caregiver PROM - 3 x daily - 7 x weekly - 3 sets - 10 reps - 5 secs hold Finger Flexion and Extension Caregiver PROM - 3 x daily - 7 x weekly - 3 sets - 10 reps - 5 secs hold   Therapy Documentation Precautions:  Precautions Precautions: Cervical Precaution Booklet Issued: No Precaution Comments: C collar on when egde of bed, upright or OOB Required Braces or Orthoses: Cervical Brace Cervical Brace: Hard collar, Other (comment) (C collar when edge of bed, upright or OOB) Restrictions Weight Bearing Restrictions: No Pain:  Pt denies pain this  morning Exercises: General Exercises - Upper Extremity Shoulder Flexion: PROM;Both;10 reps;Seated Shoulder ADduction: PROM;Both;10 reps;Seated Shoulder Horizontal ABduction: PROM;Both;10 reps;Seated Elbow Flexion: PROM;Both;10 reps;Seated Shoulder Exercises Wrist Flexion: PROM;Both;10 reps;Seated Wrist Extension: PROM;Both;10 reps;Seated Digit Composite Flexion: PROM;Both;10 reps;Seated Composite Extension: PROM;Both;10 reps;Seated Hand Exercises Forearm Supination: PROM;Both;10 reps;Seated Forearm Pronation: PROM;Both;10 reps;Seated   Therapy/Group: Individual Therapy  [name] 07/17/2020, 12:30 PM

## 2020-07-17 NOTE — Progress Notes (Signed)
Dig stim performed, removed small stool from rectum, suppository given and effective, repeated dig stim with large stool. Pr tolerated well.

## 2020-07-17 NOTE — Progress Notes (Signed)
Slept good. Agreed to bath at [facility] and agreeable to turn 3 times this shift. Bilateral WHO's applied. Bilateral PRAFO's left off and heels elevated off bed with pillows. Area appears to be rubbing from boot. Ordered Prevalon boots. O2 sats between 95-100%, during night. Coughing up small-moderate amount of sputum. Using yonkers independently, yonkers attached to bed. Ryan Day A

## 2020-07-17 NOTE — Consult Note (Signed)
Consultation Note Date: 07/17/2020   Patient Name: Ryan Day  DOB: 05/03/1951  MRN: [id number]  Age / Sex: 69 y.o., male  PCP: Valerie Roys, DO Referring Physician: Courtney Heys, MD  Reason for Consultation: Establishing goals of care and Psychosocial/spiritual support  HPI/Patient Profile: 69 y.o. male   admitted on 06/23/2020 with PMH of HTN otherwise in relatively good health who was admitted to [facility] on 04/26/2020 after fall, striking his head and face with subsequent qualdraplegia. He was found to have C5 vertebral body extension type teardrop fracture, C3-C6 cord compression with edema and was taken to OR for C3-C6 laminectomy with fusion and repair of small dural tear X 2 by Dr. Macario Carls on the same day. Post op course significant for spinal shock with hypotension and bradycardia, post procedural PTX,  C-diff colitis, significant secretions requiring multiple bronchoscopies due to mucous plugging and ultimately required PEG/Trach on 05/04/20. Hospital course further complicated by respiratory distress with PEA and and ROSC achieved after 8 minutes. Aspiration PNA and intermittent fevers treated, AKI/acute on chronic anemia/abnormal LFTs resolving and he was tolerating PMSV trials.   Patient currently completing a 3-week stay in CIR, is showing activity tolerance and improvement, plan is to return home with his wife is his main caregiver.  Patient has 2 supportive daughters.  Patient faces treatment option decisions, advanced directive decisions and anticipatory care needs.  Clinical Assessment and Goals of Care:   This NP Wadie Lessen reviewed medical records, received report from team, assessed the patient and then meet at the patient's bedside along with his wife/Linda  to discuss diagnosis, prognosis, GOC, and options.   Concept of Palliative Care was introduced as specialized medical care for people  and their families living with serious illness.  If focuses on providing relief from the symptoms and stress of a serious illness.  The goal is to improve quality of life for both the patient and the family.  Created space and opportunity for patient  to explore thoughts and feelings regarding current medical information.  This is a huge life experience change.  He and his wife are working together as they move forward establishing a new normal.     A  discussion was had today regarding advanced directives.  Concepts specific to code status, artifical feeding and hydration, continued IV antibiotics and rehospitalization was had.  The difference between a aggressive medical intervention path  and a palliative comfort care path for this patient at this time was had.  Values and goals of care important to patient and family were attempted to be elicited.   MOST form completed.     Questions and concerns addressed.  Patient  encouraged to call with questions or concerns.     PMT will continue to support holistically.        PATIENT-patient is currently making his own decision as he has full capacity.  In the event that he could not speak for himself his wife would be his Media planner.  Education offered on the service  regarding completion of healthcare power of attorney and advanced directives wife here in the hospital with the support of the spiritual care team.    SUMMARY OF RECOMMENDATIONS    Code Status/Advance Care Planning:  Full code    Additional Recommendations (Limitations, Scope, Preferences):  Full Scope Treatment  Psycho-social/Spiritual:   Desire for further Chaplaincy support:yes   Prognosis:   Unable to determine  Discharge Planning:  Recommend outpatient community-based palliative care services to follow at home   Home with Home Health      Primary Diagnoses: Present on Admission: **None**   I have reviewed the medical record, interviewed the  patient and family, and examined the patient. The following aspects are pertinent.  Past Medical History:  Diagnosis Date  . Acute on chronic respiratory failure with hypoxia ([place])   . Acute renal injury due to hypovolemia ([place])   . Autonomic instability   . Cardiac arrest ([place])   . Cervical spinal cord injury, sequela ([place])   . Elevated alkaline phosphatase level   . 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 Hyperlipidemia   . Hypertension    Social History   Socioeconomic History  . Marital status: Married    Spouse name: Not on file  . Number of children: Not on file  . Years of education: Not on file  . Highest education level: Not on file  Occupational History  . Not on file  Tobacco Use  . Smoking status: Never Smoker  . Smokeless tobacco: Never Used  Substance and Sexual Activity  . Alcohol use: Yes    Comment: 1 or less  . Drug use: No  . Sexual activity: Not on file  Other Topics Concern  . Not on file  Social History Narrative  . Not on file   Social Determinants of Health   Financial Resource Strain:   . Difficulty of Paying Living Expenses: Not on file  Food Insecurity:   . Worried About Charity fundraiser in the Last Year: Not on file  . Ran Out of Food in the Last Year: Not on file  Transportation Needs:   . Lack of Transportation (Medical): Not on file  . Lack of Transportation (Non-Medical): Not on file  Physical Activity:   . Days of Exercise per Week: Not on file  . Minutes of Exercise per Session: Not on file  Stress:   . Feeling of Stress : Not on file  Social Connections:   . Frequency of Communication with Friends and Family: Not on file  . Frequency of Social Gatherings with Friends and Family: Not on file  . Attends Religious Services: Not on file  . Active Member of Clubs or Organizations: Not on file  . Attends Archivist Meetings: Not on file  . Marital Status: Not on file   Family History  Problem Relation Age  of Onset  . Cancer Father        lung  . Cancer Brother        throat  . Heart disease Brother 69  . Liver cancer Brother   . Alcohol abuse Brother    Scheduled Meds: . vitamin C  500 mg Per Tube BID  . baclofen  5 mg Per Tube TID  . bisacodyl  10 mg Rectal QHS  . chlorhexidine  15 mL Mouth Rinse BID  . Chlorhexidine Gluconate Cloth  6 each Topical BID  . collagenase   Topical Daily  . enoxaparin (LOVENOX)  injection  40 mg Subcutaneous Q24H  . famotidine  20 mg Per Tube Daily  . feeding supplement (OSMOLITE 1.5 CAL)  474 mL Per Tube QID  . feeding supplement (PROSource TF)  45 mL Per Tube BID  . ferrous sulfate  15.6 mg of iron Per Tube Daily  . fludrocortisone  0.2 mg Per Tube Daily  . FLUoxetine  40 mg Per Tube Daily  . free water  400 mL Per Tube Q4H  . guaiFENesin  400 mg Per Tube Q6H  . insulin aspart  0-9 Units Subcutaneous BID WC  . lidocaine  1 patch Transdermal Q24H  . mouth rinse  15 mL Mouth Rinse q12n4p  . midodrine  10 mg Per Tube TID WC  . multivitamin  5 mL Per Tube Daily  . nutrition supplement (JUVEN)  1 packet Per Tube BID BM  . polycarbophil  625 mg Per Tube Daily  . saccharomyces boulardii  250 mg Per Tube BID  . scopolamine  1 patch Transdermal Q72H  . sennosides  5 mL Per Tube Q0600  . simethicone  80 mg Per Tube QID  . traZODone  50 mg Per Tube QHS  . zinc sulfate  220 mg Per Tube Daily   Continuous Infusions: PRN Meds:.acetaminophen (TYLENOL) oral liquid 160 mg/5 mL, alum & mag hydroxide-simeth, diphenhydrAMINE, guaiFENesin-dextromethorphan, ondansetron, polyethylene glycol, prochlorperazine **OR** prochlorperazine **OR** prochlorperazine, sodium phosphate Medications Prior to Admission:  Prior to Admission medications   Medication Sig Start Date End Date Taking? Authorizing Provider  aspirin 81 MG chewable tablet Chew 81 mg by mouth daily.   Yes [provider]  hydrochlorothiazide (HYDRODIURIL) 25 MG tablet Take 1 tablet (25 mg total) by  mouth daily. 02/01/20  Yes Johnson, Megan P, DO  simvastatin (ZOCOR) 40 MG tablet Take 1 tablet (40 mg total) by mouth daily. 02/01/20  Yes Johnson, Megan P, DO   No Known Allergies Review of Systems  Physical Exam Cardiovascular:     Rate and Rhythm: Normal rate.  Skin:    General: Skin is warm and dry.  Neurological:     Mental Status: He is alert.     Vital Signs: BP 120/87 (BP Location: Left Arm)   Pulse 73   Temp 97.7 F (36.5 C) (Axillary)   Resp 20   Ht 6\' 5"  (1.956 m)   Wt 105 kg   SpO2 99%   BMI 27.45 kg/m  Pain Scale: 0-10 POSS *See Group Information*: 1-Acceptable,Awake and alert Pain Score: 0-No pain   SpO2: SpO2: 99 % O2 Device:SpO2: 99 % O2 Flow Rate: .O2 Flow Rate (L/min): 2 L/min  IO: Intake/output summary:   Intake/Output Summary (Last 24 hours) at 07/17/2020 1104 Last data filed at 07/17/2020 0433 Gross per 24 hour  Intake 474 ml  Output 2550 ml  Net -2076 ml    LBM: Last BM Date: 07/16/20 Baseline Weight: Weight: 105.2 kg Most recent weight: Weight: 105 kg     Palliative Assessment/Data: 30 %      Time In: 0830 Time Out: 0930 Time Total: 60 minutes Greater than 50%  of this time was spent counseling and coordinating care related to the above assessment and plan.  Signed by: Wadie Lessen, NP   Please contact Palliative Medicine Team phone at [phone] for questions and concerns.  For individual provider: See Shea Evans

## 2020-07-17 NOTE — Progress Notes (Signed)
Speech Language Pathology Daily Session Note  Patient Details  Name: Sabri Teal MRN: [id number] Date of Birth: 1951-05-15  Today's Date: 07/17/2020 SLP Individual Time: 1303-1350 SLP Individual Time Calculation (min): 47 min  Short Term Goals: Week 4: SLP Short Term Goal 1 (Week 4): STG's=LTG's (ELOS discharge 10/28)  Skilled Therapeutic Interventions:Skilled ST services focused on swallow and speech skills. Pt demonstrated abiity to utilize "hey google" to call wife with SLP initiating the call pressing the microphone button. Pt spoke with wife of phone for a few minutes producing 1-2 word responses and had to clarify message due to low vocal intensity with min A verbal cues from wife. SLP facilitated recall of swallowing exercises pt required min A verbal cues and admitted to not completing recommended amount of repetitions. SLP provided education pertaining to the importance of completing exercises to increase vocal intensity and to improve swallow function. Pt completed x15 masako exercises, x10 sustained phonation of "eee" for 1-2 seconds and EMST set at 9cm H20 x15. Pt consumed x15 ice chips following oral care with no overt s/s aspiration. Pt was left in room with call bell within reach and chair alarm set. SLP recommends to continue skilled services.     Pain Pain Assessment Pain Score: 0-No pain  Therapy/Group: Individual Therapy  Odette Watanabe  [facility] 07/17/2020, 4:17 PM

## 2020-07-17 NOTE — Progress Notes (Addendum)
Patient ID: Ryan Day, male   DOB: 02/11/1951, 69 y.o.   MRN: [id number]  SW left message for [name] [phone]) to follow-up about documents faxed on Friday,and if there were any updates on if they are able to assist with pt care needs.  *SW received updates that he is going to check with patient's insurance to see if he is eligible for the private duty nursing program, and will follow-up about the number of hours he is able to receive as well. SW waiting on follow-up.  SW left message for [name]/receptionist with [name] [phone]) to follow-up about speaking with nursing staff to discuss how they can provide care to him.   SW returned phone call to pt wife [name] [phone]) to inform on above updates. SW informed there will be updates once there is more information.   *SW spoke with [name]/receptionist with [name] [phone]) to discuss speaking with an Therapist, sports. Reports nurse that is needed is out today, and SW to call tomorrow. She also reported they may not have all dme in the office to assist. SW informed on DME pt will be sent home with which is a portable suction machine. States she will explore further what equipment they are referring too. SW to follow-up tomorrow.   [name], MSW, [place] Office: [phone] Cell: [phone] Fax: [phone]

## 2020-07-17 NOTE — Progress Notes (Signed)
Physical Therapy Session Note  Patient Details  Name: Ryan Day MRN: [id number] Date of Birth: Jul 18, 1951  Today's Date: 07/17/2020 PT Individual Time: 0800-0900 PT Individual Time Calculation (min): 60 min   Short Term Goals: Week 3:  PT Short Term Goal 1 (Week 3): =LTG due to ELOS  Skilled Therapeutic Interventions/Progress Updates:    Pt received semi-reclined in bed, agreeable to PT session. No complaints of pain. Semi-reclined BP 119/79 prior to donning any additional clothing. Assisted pt with donning thigh high TEDs, ACE wrap, and abdominal binder. Pt is dependent +2 for rolling R/L for placement of binder as well as maxi sky sling. Supine BP 141/82. Maxi sky transfer to [name]. While sitting in upright position in w/c BP 109/64. Pt with no complaints of dizziness or lightheadedness throughout session. Pt agreeable to stay seated up in w/c at end of session, sip/puff call button in reach and suction in reach.  Therapy Documentation Precautions:  Precautions Precautions: Cervical Precaution Booklet Issued: No Precaution Comments: C collar on when egde of bed, upright or OOB Required Braces or Orthoses: Cervical Brace Cervical Brace: Hard collar, Other (comment) (C collar when edge of bed, upright or OOB) Restrictions Weight Bearing Restrictions: No   Therapy/Group: Individual Therapy   Excell Seltzer, PT, DPT  07/17/2020, 12:19 PM

## 2020-07-17 NOTE — Progress Notes (Signed)
[place] PHYSICAL MEDICINE & REHABILITATION PROGRESS NOTE   Subjective/Complaints:   Pt reports he'd "like to trade places with me" this AM.   BP better; no dizziness; Denies pain.  sats 98%   ROS:  Pt denies SOB, abd pain, CP, N/V/C/D, and vision changes   Objective:   No results found. Recent Labs    07/17/20 0810  WBC 7.7  HGB 8.1*  HCT 25.8*  PLT 348   Recent Labs    07/17/20 0810  NA 144  K 3.7  CL 106  CO2 29  GLUCOSE 100*  BUN 25*  CREATININE 0.65  CALCIUM 9.5    Intake/Output Summary (Last 24 hours) at 07/17/2020 1900 Last data filed at 07/17/2020 1653 Gross per 24 hour  Intake 474 ml  Output 2150 ml  Net -1676 ml     Pressure Injury 06/23/20 Buttocks Right Unstageable - Full thickness tissue loss in which the base of the injury is covered by slough (yellow, tan, gray, green or brown) and/or eschar (tan, brown or black) in the wound bed. (Active)  06/23/20 1838  Location: Buttocks  Location Orientation: Right  Staging: Unstageable - Full thickness tissue loss in which the base of the injury is covered by slough (yellow, tan, gray, green or brown) and/or eschar (tan, brown or black) in the wound bed.  Wound Description (Comments):   Present on Admission: Yes     Pressure Injury 06/23/20 Buttocks Left Unstageable - Full thickness tissue loss in which the base of the injury is covered by slough (yellow, tan, gray, green or brown) and/or eschar (tan, brown or black) in the wound bed. (Active)  06/23/20 1839  Location: Buttocks  Location Orientation: Left  Staging: Unstageable - Full thickness tissue loss in which the base of the injury is covered by slough (yellow, tan, gray, green or brown) and/or eschar (tan, brown or black) in the wound bed.  Wound Description (Comments):   Present on Admission: Yes    Physical Exam: Vital Signs Blood pressure (!) 112/53, pulse 78, temperature 97.6 F (36.4 C), resp. rate 18, height 6\' 5"  (1.956 m),  weight 105 kg, SpO2 99 %.  Constitutional: No distress . Vital signs reviewed. Awake, alert, using sip and puff to call nursing, PT wrapping legs, NAD HENT: Normocephalic.  Atraumatic. Neck: Capped trach Eyes: EOMI. No discharge. Cardiovascular: RRR Respiratory: CTA B/L- no W/R/R- good air movement GI: Soft, NT, ND, (+)BS   + PEG Skin: Warm and dry.  PEG site CDI GU: + Foley Psych: appropriate Musc: No edema in extremities.  No tenderness in extremities. Neuro: Alert , but quiet; legs wrapped in TEDs and ACE wraps Dysphonia Motor: B/l UE shoulder abduction 1+/5, distally 0/5 with ?trace flicker left wrist flexor, unchanged B/l LE: 0/5 proximal to distal, unchanged  Assessment/Plan: 1. 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  Physiatrist is providing close team supervision and 24 hour management of active medical problems listed below.  Physiatrist and rehab team continue to assess barriers to discharge/monitor patient progress toward functional and medical goals  Care Tool:  Bathing              Bathing assist Assist Level: 2 Helpers     Upper Body Dressing/Undressing Upper body dressing   What is the patient wearing?: Pull over shirt    Upper body assist Assist Level: 2 Helpers    Lower Body Dressing/Undressing Lower  body dressing      What is the patient wearing?: Incontinence brief, Pants     Lower body assist Assist for lower body dressing: 2 Helpers     Toileting Toileting    Toileting assist Assist for toileting: Dependent - Patient 0%     Transfers Chair/bed transfer  Transfers assist  Chair/bed transfer activity did not occur: Safety/medical concerns  Chair/bed transfer assist level: Dependent - mechanical lift     Locomotion Ambulation   Ambulation assist   Ambulation activity did not occur: Safety/medical concerns           Walk 10 feet activity   Assist           Walk 50 feet activity   Assist           Walk 150 feet activity   Assist           Walk 10 feet on uneven surface  activity   Assist           Wheelchair     Assist Will patient use wheelchair at discharge?: Yes Type of Wheelchair: Power Wheelchair activity did not occur: Safety/medical concerns  Wheelchair assist level: Minimal Assistance - Patient > 75% Max wheelchair distance: 67'    Wheelchair 50 feet with 2 turns activity    Assist    Wheelchair 50 feet with 2 turns activity did not occur: Safety/medical concerns   Assist Level: Minimal Assistance - Patient > 75%   Wheelchair 150 feet activity     Assist  Wheelchair 150 feet activity did not occur: Safety/medical concerns       Blood pressure (!) 112/53, pulse 78, temperature 97.6 F (36.4 C), resp. rate 18, height 6\' 5"  (1.956 m), weight 105 kg, SpO2 99 %.    Medical Problem List and Plan: 1.  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  Antithrombotics: -DVT/anticoagulation:  Pharmaceutical: Lovenox 10/5- Dopplers (-)  10/10- needs for 3 months from starting it             -antiplatelet therapy: N/A                                                      3. Pain Management: N/A 4. Mood: LCSW to follow for evaluation and support.              -antipsychotic agents: N/A  Appreciate neuropsych consult  Appears stable on 10/24 5. Neuropsych: This patient is capable of making decisions on his own behalf. 6. Sacral decub/Skin/Wound Care: Pictures show wound in different stages of healing. Ordered Air mattress overlay. Continue to reposition q2H. Continue vitamin and prostat to promote healing. Will add Juven also for collagen.  Increased tube feed to 50 cc/hr as likely has higher nutritional need.  Consulted dietician to help address nutritional needs.  Appreciate WOC eval and recs 7.  Fluids/Electrolytes/Nutrition: Monitor I/Os.  8. Orthostatic Hypotension: Secondary to autonomic dysfunction-              Vitals:   07/17/20 1251 07/17/20 1500  BP: (!) 112/53   Pulse: 66 78  Resp: 17 18  Temp: 97.6 F (36.4 C)   SpO2: 100% 99%   10/25- on Florinef AND Midodrine, ACE wraps, Abd binder and TEDs- explained will slowly get  rid of these things as can tolerate better and better.  9. Acute on chronic anemia: Improving--continue to monitor. Added iron supplement.              Hgb 8.2 on 10/19, labs ordered for tomorrow  10/25- Hb stable at 8.1   Iron level low- supplement initiated 10. Hypernatremia: Added water flushes. May need low sodium formula.   Na 139 on 10/22  Responding to free H20  10/25- Na 144- but stable- con't regimen 11. Neurogenic bladder: Maintain Foley due to sacral wounds.. 12. Neurogenic bowel: Continue senna in am with suppository in evenings for bowel program. Moving bowels regularly   Results with bowel program  10/25- nightly bowel program- getting results 13. Secretions/Trach/capped: continue scopolamine patch  Capped trach  10/20: CXR is stable.   Encourage IS.   10/25- need to continue trach due to intermittent need for suctioning- concerned if removed, won't be able to suction when needed 14. Azotemia/NPO- getting TFs  Improving 15. W/C requirement- will need either head array OR chin control power w/c- w/c rep/company- working on getting a demo for him.   16. Hypokalemia  K+4.1 on 10/22 17. Spasticity  Baclofen 5 mg TID with benefit 18. Retracted foreskin with chronic foley, no sign of ischemia, edema posterior foreskin, may be related to increased peripheral edema although no scrotal swelling - Uro on call Dr Sheppard Coil, felt that Uro eval was not needed at this time 19. Disposition: family meeting held - goal is DC home. Given his quadriparesis, Mr. Ord requires a hospital bed with hoyer lift, low airloss mattress, and a power wheelchair.     Pt will require: 1. Hospital bed- semi-electric due to need for positioning for TFs, and to transfer pt to his power w/c.  2. He needs hoyer lift with XL sling due to his size- he's 6 ft 5 inches tall and >200 lbs- he's quadriplegia with NO movement of arms or legs- he needs to be transferred by his wife and she cannot do so without a hoyer lift.  3. He needs a low air loss mattress due to unstageable to stage III pressure ulcer on his buttocks (it is currently  A mixed staged ulcer)- to prevent from developing a STAGE IV ulcer, he truly needs this mattress. 4. Power w/c with a head control or chin control due to being a C4 quadriplegic- he has NO significant movement of arms or legs- and therefore, HAS to be able to mobilize to do pressure relief, esp because of Unstageable/stage III pressure ulcer as well as be able to move from place to place, these are the only options-   I also spoke to [facility] today and they extended pt to 10/29 to get home.   I spent a total of 30 minutes on care today as detailed above.    LOS: 24 days A FACE TO FACE EVALUATION WAS PERFORMED  Lynore Coscia 07/17/2020, 7:00 PM

## 2020-07-18 ENCOUNTER — Inpatient Hospital Stay (HOSPITAL_COMMUNITY): Payer: BC Managed Care – PPO | Admitting: Physical Therapy

## 2020-07-18 ENCOUNTER — Inpatient Hospital Stay (HOSPITAL_COMMUNITY): Payer: BC Managed Care – PPO | Admitting: *Deleted

## 2020-07-18 DIAGNOSIS — S14109S Unspecified injury at unspecified level of cervical spinal cord, sequela: Secondary | ICD-10-CM | POA: Diagnosis not present

## 2020-07-18 LAB — BASIC METABOLIC PANEL
Anion gap: 7 (ref 5–15)
BUN: 25 mg/dL — ABNORMAL HIGH (ref 8–23)
CO2: 29 mmol/L (ref 22–32)
Calcium: 9.4 mg/dL (ref 8.9–10.3)
Chloride: 106 mmol/L (ref 98–111)
Creatinine, Ser: 0.65 mg/dL (ref 0.61–1.24)
GFR, Estimated: >60 mL/min (ref >60)
Glucose, Bld: 186 mg/dL — ABNORMAL HIGH (ref 70–99)
Potassium: 3.5 mmol/L (ref 3.5–5.1)
Sodium: 142 mmol/L (ref 135–145)

## 2020-07-18 LAB — GLUCOSE, CAPILLARY
Glucose-Capillary: 88 mg/dL (ref 70–99)
Glucose-Capillary: 135 mg/dL — ABNORMAL HIGH (ref 70–99)
Glucose-Capillary: 133 mg/dL — ABNORMAL HIGH (ref 70–99)

## 2020-07-18 MED ORDER — FREE WATER
400.0000 mL | Status: DC
  Administered 2020-07-18 – 2020-07-20 (×11): 400 mL

## 2020-07-18 MED ORDER — ACETAMINOPHEN 160 MG/5ML PO SOLN
320.0000 mg | ORAL | 0 refills | Status: DC | PRN
Start: 2020-07-18 — End: 2020-12-08

## 2020-07-18 MED ORDER — ASCORBIC ACID 500 MG PO TABS: 500.0000 mg | ORAL_TABLET | Freq: Two times a day (BID) | ORAL | Status: AC

## 2020-07-18 NOTE — Progress Notes (Signed)
[place] PHYSICAL MEDICINE & REHABILITATION PROGRESS NOTE   Subjective/Complaints:   Pt reports slept well- denies pain- needs suctioning-  Asked nurse to make sure wife knows suctioning as well as trach mgmt.    ROS:   Pt denies SOB, abd pain, CP, N/V/C/D, and vision changes    Objective:   No results found. Recent Labs    07/17/20 0810  WBC 7.7  HGB 8.1*  HCT 25.8*  PLT 348   Recent Labs    07/17/20 0810  NA 144  K 3.7  CL 106  CO2 29  GLUCOSE 100*  BUN 25*  CREATININE 0.65  CALCIUM 9.5    Intake/Output Summary (Last 24 hours) at 07/18/2020 1033 Last data filed at 07/18/2020 0530 Gross per 24 hour  Intake 474 ml  Output 2250 ml  Net -1776 ml     Pressure Injury 06/23/20 Buttocks Right Unstageable - Full thickness tissue loss in which the base of the injury is covered by slough (yellow, tan, gray, green or brown) and/or eschar (tan, brown or black) in the wound bed. (Active)  06/23/20 1838  Location: Buttocks  Location Orientation: Right  Staging: Unstageable - Full thickness tissue loss in which the base of the injury is covered by slough (yellow, tan, gray, green or brown) and/or eschar (tan, brown or black) in the wound bed.  Wound Description (Comments):   Present on Admission: Yes     Pressure Injury 06/23/20 Buttocks Left Unstageable - Full thickness tissue loss in which the base of the injury is covered by slough (yellow, tan, gray, green or brown) and/or eschar (tan, brown or black) in the wound bed. (Active)  06/23/20 1839  Location: Buttocks  Location Orientation: Left  Staging: Unstageable - Full thickness tissue loss in which the base of the injury is covered by slough (yellow, tan, gray, green or brown) and/or eschar (tan, brown or black) in the wound bed.  Wound Description (Comments):   Present on Admission: Yes    Physical Exam: Vital Signs Blood pressure 122/84, pulse 68, temperature 97.7 F (36.5 C), resp. rate 16, height 6\' 5"   (1.956 m), weight 109.2 kg, SpO2 100 %.  Constitutional: No distress . Vital signs reviewed.awake, alert, appropriate, NAD HENT: Normocephalic.  Atraumatic. Neck: Capped trach- no change Eyes: EOMI. No discharge. Cardiovascular: RRR Respiratory: very coarse, wheezing and rhonchi noted- needs suctioning GI: Soft, NT, ND, (+)BS   + PEG Skin: Warm and dry.  PEG site CDI GU: + Foley Psych: appropriate Musc: No edema in extremities.  No tenderness in extremities. Neuro: Alert , but quiet; legs wrapped in TEDs and ACE wraps Dysphonia Motor: B/l UE shoulder abduction 1+/5, distally 0/5 with ?trace flicker left wrist flexor, unchanged B/l LE: 0/5 proximal to distal, unchanged    Assessment/Plan: 1. 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  Physiatrist is providing close team supervision and 24 hour management of active medical problems listed below.  Physiatrist and rehab team continue to assess barriers to discharge/monitor patient progress toward functional and medical goals  Care Tool:  Bathing              Bathing assist Assist Level: 2 Helpers     Upper Body Dressing/Undressing Upper body dressing   What is the patient wearing?: Pull over shirt    Upper body assist Assist Level: 2 Helpers    Lower Body Dressing/Undressing Lower body dressing  What is the patient wearing?: Incontinence brief, Pants     Lower body assist Assist for lower body dressing: 2 Helpers     Toileting Toileting    Toileting assist Assist for toileting: Dependent - Patient 0%     Transfers Chair/bed transfer  Transfers assist  Chair/bed transfer activity did not occur: Safety/medical concerns  Chair/bed transfer assist level: Dependent - mechanical lift     Locomotion Ambulation   Ambulation assist   Ambulation activity did not occur: Safety/medical concerns           Walk 10 feet activity   Assist           Walk 50 feet activity   Assist           Walk 150 feet activity   Assist           Walk 10 feet on uneven surface  activity   Assist           Wheelchair     Assist Will patient use wheelchair at discharge?: Yes Type of Wheelchair: Power Wheelchair activity did not occur: Safety/medical concerns  Wheelchair assist level: Minimal Assistance - Patient > 75% Max wheelchair distance: 109'    Wheelchair 50 feet with 2 turns activity    Assist    Wheelchair 50 feet with 2 turns activity did not occur: Safety/medical concerns   Assist Level: Minimal Assistance - Patient > 75%   Wheelchair 150 feet activity     Assist  Wheelchair 150 feet activity did not occur: Safety/medical concerns       Blood pressure 122/84, pulse 68, temperature 97.7 F (36.5 C), resp. rate 16, height 6\' 5"  (1.956 m), weight 109.2 kg, SpO2 100 %.    Medical Problem List and Plan: 1.  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  Antithrombotics: -DVT/anticoagulation:  Pharmaceutical: Lovenox 10/5- Dopplers (-)  10/10- needs for 3 months from starting it             -antiplatelet therapy: N/A                                                      3. Pain Management: N/A 4. Mood: LCSW to follow for evaluation and support.              -antipsychotic agents: N/A  Appreciate neuropsych consult  Appears stable on 10/24 5. Neuropsych: This patient is capable of making decisions on his own behalf. 6. Sacral decub/Skin/Wound Care: Pictures show wound in different stages of healing. Ordered Air mattress overlay. Continue to reposition q2H. Continue vitamin and prostat to promote healing. Will add Juven also for collagen.  Increased tube feed to 50 cc/hr as likely has higher nutritional need.  Consulted dietician to help address nutritional needs  10/26- healing well with current regimen-  pic as above  Appreciate WOC eval and recs 7. Fluids/Electrolytes/Nutrition: Monitor I/Os.  8. Orthostatic Hypotension: Secondary to autonomic dysfunction-              Vitals:   07/18/20 0430 07/18/20 0446  BP:  122/84  Pulse: 66 68  Resp: 16 16  Temp:  97.7 F (36.5 C)  SpO2: 99% 100%   10/25- on Florinef AND Midodrine, ACE wraps, Abd binder and TEDs- explained will slowly  get rid of these things as can tolerate better and better.  10/26- Sx's improving  9. Acute on chronic anemia: Improving--continue to monitor. Added iron supplement.              Hgb 8.2 on 10/19, labs ordered for tomorrow  10/25- Hb stable at 8.1   Iron level low- supplement initiated 10. Hypernatremia: Added water flushes. May need low sodium formula.   Na 139 on 10/22  Responding to free H20  10/25- Na 144- but stable- con't regimen  10/26- labs pending for today 11. Neurogenic bladder: Maintain Foley due to sacral wounds.. 12. Neurogenic bowel: Continue senna in am with suppository in evenings for bowel program. Moving bowels regularly   Results with bowel program  10/25- nightly bowel program- getting results 13. Secretions/Trach/capped: continue scopolamine patch  Capped trach  10/20: CXR is stable.   Encourage IS.   10/25- need to continue trach due to intermittent need for suctioning- concerned if removed, won't be able to suction when needed  10/26- cannot remove trach- still needing for suctioning 14. Azotemia/NPO- getting TFs  Improving 15. W/C requirement- will need either head array OR chin control power w/c- w/c rep/company- working on getting a demo for him.   16. Hypokalemia  K+4.1 on 10/22 17. Spasticity  Baclofen 5 mg TID with benefit 18. Retracted foreskin with chronic foley, no sign of ischemia, edema posterior foreskin, may be related to increased peripheral edema although no scrotal swelling - Uro on call Dr Sheppard Coil, felt that Uro eval was not needed at this time 19. Disposition:  family meeting held - goal is DC home. Given his quadriparesis, Mr. Cothern requires a hospital bed with hoyer lift, low airloss mattress, and a power wheelchair.    Pt will require: 1. Hospital bed- semi-electric due to need for positioning for TFs, and to transfer pt to his power w/c.  2. He needs hoyer lift with XL sling due to his size- he's 6 ft 5 inches tall and >200 lbs- he's quadriplegia with NO movement of arms or legs- he needs to be transferred by his wife and she cannot do so without a hoyer lift.  3. He needs a low air loss mattress due to unstageable to stage III pressure ulcer on his buttocks (it is currently  A mixed staged ulcer)- to prevent from developing a STAGE IV ulcer, he truly needs this mattress. 4. Power w/c with a head control or chin control due to being a C4 quadriplegic- he has NO significant movement of arms or legs- and therefore, HAS to be able to mobilize to do pressure relief, esp because of Unstageable/stage III pressure ulcer as well as be able to move from place to place, these are the only options-   I also spoke to [facility] today and they extended pt to 10/29 to get home.   I spent a total of 30 minutes on care today as detailed above.    LOS: 25 days A FACE TO FACE EVALUATION WAS PERFORMED  Cleaven Demario 07/18/2020, 10:33 AM

## 2020-07-18 NOTE — Patient Care Conference (Signed)
Inpatient RehabilitationTeam Conference and Plan of Care Update Date: 07/18/2020   Time: 10:59 AM    Patient Name: Ryan Day      Medical Record Number: [id number]  Date of Birth: 06/07/1951 Sex: Male         Room/Bed: 4W15C/4W15C-01 Payor Info: Payor: MEDICARE / Plan: MEDICARE PART A / Product Type: *No Product type* /    Admit Date/Time:  06/23/2020  4:40 PM  Primary Diagnosis:  Spinal cord injury, cervical region, sequela [facility])  Hospital Problems: Principal Problem:   Spinal cord injury, cervical region, sequela ([place]) Active Problems:   Pressure injury of skin   Spasticity   Status post tracheostomy [facility])   Salivary secretion    Expected Discharge Date: Expected Discharge Date: 07/20/20  Team Members Present: Physician leading conference: Dr. Courtney Heys Care Coodinator Present: [name], LCSWA;Carey Lafon Creig Hines, RN, BSN, [place] Nurse Present: Mohammed Kindle, RN PT Present: Excell Seltzer, PT OT Present: Willeen Cass, OT;Roanna Epley, COTA SLP Present: Weston Anna, SLP PPS Coordinator present : Ileana Ladd, PT     Current Status/Progress Goal Weekly Team Focus  Bowel/Bladder   Neurogenic B/B foley catheter pt on bowel program nightly LBM 10/25  continue bowel program and document results, educate family/patient on B/B program and foley care  assess need for continued foley catheter, bowel program nightly with results charted, continue to assess educational needs of family and patient   Swallow/Nutrition/ Hydration   NPO, water protocol  Supervision A  education, continue thin trials, pharyngeal exercises and increasing vocal intensity   ADL's   tot A+2 for all bed mobility, dependent transfers via lift, mod verbal cues to direct care  dynamic sitting balance-mod A; self feeding-max A; grooming-supervision; directing care with BADLs and transfers-supervision (functional goals to be downgraded)  education, bed mobility, activity tolerance   Mobility   +2  rolling, transfers dependently via lift, min A PWC mobility  mod I at [facility] level, family independent with assisting pt with transfers  family education, d/c planning, PWC mobility   Communication             Safety/Cognition/ Behavioral Observations            Pain   denies pain  remain free of pain  assess pain qshift and prn medicate as prescribed monitor for relief   Skin   Stage II right buttocks santyl moist to dry foam dressing daily- green thick drainage with foul odor present  improvement of pressure injury no new breakdown or infections  wound care as directed assess skin qshift and prn, pressure relief when in wheelchair     Discharge Planning:  D/c to home with his wife who plans on providing 24/7 care. She does report having back issues, but is still willing to provide care. Family education has been ongoing since last week. Pt will not d/c to home with any additional therapies. Pt will need HEP at discharge. Waiting on updates from [name] if pt is eligible for private duty nursing program.   Team Discussion: Incontinent bowel with bowel program in place, foley in place. Abdomen distended and firm. PT reports they are doing education with the wife. OT reports they are also doing education with the wife. SLP reports that an outpatient MBS is scheduled for 08/02/20. MD reports that the wounds to the patient's bottom look better. Hospital bed has been delivered to the home, power W/C scheduled to be delivered to the home. Patient on target to meet rehab goals: yes  *  See Care Plan and progress notes for long and short-term goals.   Revisions to Treatment Plan:  None at this time  Teaching Needs: Continue with foley education, bowel program education, diabetes education, wound care education and trach care education.  Current Barriers to Discharge: Decreased caregiver support, Home enviroment access/layout, Trach, Neurogenic bowel and bladder, Wound care, Weight, Weight bearing  restrictions and Nutritional means  Possible Resolutions to Barriers: Continue current medication regimen, continue with family education, continue to treat pressure injury to bottom.     Medical Summary Current Status: wound is better- unstageable/slough in middle- (+) foley; bowel program at 6pm;  Barriers to Discharge: Home enviroment access/layout;Incontinence;Neurogenic Bowel & Bladder;Trach;Weight;Weight bearing restrictions;Wound care;Nutrition means  Barriers to Discharge Comments: NPO- has TFs via PEG; will need HEP; w/c delivered tomorrow; has hospital bed- d/c Thursday Possible Resolutions to Barriers/Weekly Focus: waiting for H/H nursing; focusing on family ed; will be ready for d/c Thursday; no more MBS before d/c- 11/11 is next outpt MBS.   Continued Need for Acute Rehabilitation Level of Care: The patient requires daily medical management by a physician with specialized training in physical medicine and rehabilitation for the following reasons: Direction of a multidisciplinary physical rehabilitation program to maximize functional independence : Yes Medical management of patient stability for increased activity during participation in an intensive rehabilitation regime.: Yes Analysis of laboratory values and/or radiology reports with any subsequent need for medication adjustment and/or medical intervention. : Yes   I attest that I was present, lead the team conference, and concur with the assessment and plan of the team.   Cristi Loron 07/18/2020, 4:22 PM

## 2020-07-18 NOTE — Progress Notes (Signed)
Physical Therapy Session Note  Patient Details  Name: Ryan Day MRN: [id number] Date of Birth: [birth year]  Today's Date: 07/18/2020 PT Individual Time: 0800-0900; 0350-0938; [id number] PT Individual Time Calculation (min): 60 min and 45 min and 15 min PT Missed Time: 30 min Missed Time Reason: fatigue; pt refusal  Short Term Goals: Week 3:  PT Short Term Goal 1 (Week 3): =LTG due to ELOS  Skilled Therapeutic Interventions/Progress Updates:    Session 1: Pt received seated in bed, agreeable to PT session. No complaints of pain. Nursing and MD in room to assess sacral wound. Assisted nursing with rolling patient in order to perform wound care and donning of clean brief. Pt is total A +2 for rolling, total A x 1 for maintaining sidelying position while wound care performed. Supine BP 101/55.  Assisted pt with dependently donning BLE TEDs and ACE wrap for BP management. Pt is total A x 2 for rolling R/L for donning of abdominal binder and placement of maxi sky sling. Assisted pt with donning cervical collar at bed level. Maxi sky transfer to [facility]. Once seated up in [place] seated BP 75/49. Tilted pt into semi-reclined position. BP re-assessed to be 82/57. Tilted pt into further semi-reclined position. BP re-assessed to be 120/67 while pt partially reclined in [place]. [name] [name] present at end of session. Reviewed how to assist pt with position changes in chair for pressure relief to be performed every 30 min. Pt left semi-reclined in [place] in room with suction in reach and wife present.  Session 2: Pt received seated in PWC in room, agreeable to PT session. [name] [name] present and reports she has been assisting pt with pressure relief while he has been seated up in the chair today. [employer] present to retrieve [place] in order to bring to patient's home. Pt is dependent for placement of maxi sky sling in order to transfer out of [place] and back to bed. Maxi sky transfer back to bed. Rolling L/R with  assist x 2 for removal of sling. Pt found to be incontinent of bowel. Pt is dependent for pericare and brief change. Pt's wound also found to be soiled, nursing in room to perform wound dressing change for patient. Removed pt's abdominal binder due to reports of discomfort. Semi-reclined BP 108/62 without use of abdominal binder. Reviewed handout of BLE PROM to be performed with patient with his wife. Demonstrated how to perform PROM in available planes of motion to prevent contracture in hips, knees, and at ankles. Pt left semi-reclined in bed with needs in reach and wife present at end of session.  Session 3: Pt received semi-reclined in bed, pt declines participation in therapy this PM due to fatigue. Encouraged pt to participate in bed level therapy or even PROM at bed level. Pt declines due to fatigue. Pt agreeable to have BLE PRAFOs placed. Assisted pt with doffing TEDs and ACE wrap and placing PRAFOs in place. Semi-reclined BP 103/62 with removal of TEDs and ACE wrap. Pt left semi-reclined in bed with needs in reach at end of session. Pt missed 30 min of scheduled therapy session due to fatigue and refusal.  Therapy Documentation Precautions:  Precautions Precautions: Cervical Precaution Booklet Issued: No Precaution Comments: C collar on when egde of bed, upright or OOB Required Braces or Orthoses: Cervical Brace Cervical Brace: Hard collar, Other (comment) (C collar when edge of bed, upright or OOB) Restrictions Weight Bearing Restrictions: No   Therapy/Group: Individual Therapy   Excell Seltzer,  PT, DPT  07/18/2020, 12:04 PM

## 2020-07-18 NOTE — Progress Notes (Signed)
Nutrition Follow-up  DOCUMENTATION CODES:   Not applicable  INTERVENTION:   Continue bolus tube feeds via PEG: - 474 ml (2 cartons) Osmolite 1.5 cal formula QID - ProSource TF 45 ml BID - Free water flushes of 400 ml q 4 hours  Tube feeding regimen provides2920kcal, 142grams of protein, and 1452m of H2O.   Total free water with current flushes: 3848 ml  NUTRITION DIAGNOSIS:   Increased nutrient needs related to wound healing as evidenced by estimated needs.  Ongoing  GOAL:   Patient will meet greater than or equal to 90% of their needs  Met via TF  MONITOR:   TF tolerance, Skin, Diet advancement, Labs  REASON FOR ASSESSMENT:   Consult Enteral/tube feeding initiation and management  ASSESSMENT:   69yo male admitted with functional deficits due to SCI/quadriplegia. [facility], striking his head, and resulting in quadriplegia. Hospitalization complicated by PTX, C diff colitis, PEA, respiratory distress, aspiration PNA, AKI. Also required trach and PEG. [facility] recommended by his therapy team.  10/20 - MBS with recommendations to continue NPO  Pt remains NPO and receiving tube feeds via PEG. RN requesting to adjust timing of free water flushes so that pt does not receive free water flushes and boluses at the same time. Pt's abdomen distended today. RN reports that this has been the case for a few days. Pt denies any discomfort and continues to have BMs. Pt taking in some ice chips at time of RD visit.  Admit weight: 105.2 kg Current weight: 109.2 kg  Pt with non-pitting edema to BUE and BLE.  Medications reviewed and include: vitamin C, dulcolax, pepcid, ferrous sulfate, SSI, liquid MVI, Juven, fibercon, florastor, senokot, simethicone, zinc sulfate  Labs reviewed: BUN 25, hemoglobin 8.1 CBG's: 88-147 x 24 hours  UOP: 2250 ml x 24  hours  Diet Order:   Diet Order            Diet NPO time specified  Diet effective now                 EDUCATION NEEDS:   Not appropriate for education at this time  Skin:  Skin Assessment: Skin Integrity Issues: Unstageable: bilateral buttocks  Last BM:  07/17/20 large type 4  Height:   Ht Readings from Last 1 Encounters:  06/23/20 '6\' 5"'  (1.956 m)    Weight:   Wt Readings from Last 1 Encounters:  07/18/20 109.2 kg    Ideal Body Weight:  94.5 kg  BMI:  Body mass index is 28.55 kg/m.  Estimated Nutritional Needs:   Kcal:  2600-2800  Protein:  145-165 gm  Fluid:  >/= 2.4 L    KGaynell Face MS, RD, LDN Inpatient Clinical Dietitian Please see AMiON for contact information.

## 2020-07-18 NOTE — Progress Notes (Signed)
Occupational Therapy Session Note  Patient Details  Name: Ryan Day MRN: [id number] Date of Birth: 1951-05-28  Today's Date: 07/18/2020 OT Individual Time: 1030-1100 OT Individual Time Calculation (min): 30 min  and Today's Date: 07/18/2020 OT Missed Time: 30 Minutes Missed Time Reason: Nursing care   Short Term Goals: Week 4:  OT Short Term Goal 1 (Week 4): STG=LTG 2/2 ELOS  Skilled Therapeutic Interventions/Progress Updates:    Pt missed 30 mins skilled OT services 2/2 nursing care/family education and visit by Chaplain. [name] present.  Pt in TIS w/c. OT intervention with focus on education. HEP for for BUE PROM demonstrated for wife.  Wife return demonstrated with min verbal cues. HEP handout provided to wife (see note of 10/25). Pt remained seated in TIS w/c with wife present and [place] suction placed where pt can access.   Therapy Documentation Precautions:  Precautions Precautions: Cervical Precaution Booklet Issued: No Precaution Comments: C collar on when egde of bed, upright or OOB Required Braces or Orthoses: Cervical Brace Cervical Brace: Hard collar, Other (comment) (C collar when edge of bed, upright or OOB) Restrictions Weight Bearing Restrictions: No General: General OT Amount of Missed Time: 30 Minutes   Pain: Pain Assessment Pain Scale: 0-10 Pain Score: 0-No pain   Therapy/Group: Individual Therapy  [name] 07/18/2020, 11:08 AM

## 2020-07-18 NOTE — Progress Notes (Signed)
Patient ID: Ryan Day, male   DOB: 05-Feb-1951, 69 y.o.   MRN: [id number]  SW received updates from [name] that referral for private duty nursing was sent to pt insurance, and waiting on updates.   SW spoke with [facility] ([phone]/[phone]) to discuss referral family sent him. SW explained current agency involved and waiting on updates from insurance. Reports will review referral, and will get insurance benefits, and will wait for SW to follow-up on if other agency able to accept. SW faxed referral.  Wife confirms DME received yesterday.   SW currently working on trach order with [name].   [name], MSW, [place] Office: [phone] Cell: [phone] Fax: [phone]

## 2020-07-18 NOTE — Progress Notes (Signed)
Bowel program started at 1840 with dig stim. Small amount of mushy stool returned with dig stim. Dulcolax suppository given. No results at this time. Will report off to oncoming staff.   Gerald Stabs, RN

## 2020-07-18 NOTE — Progress Notes (Signed)
The chaplain visited the Pt. and understands the Pt. plans  to move forward with notarizing AD today.  The chaplain is Geologist, engineering services.

## 2020-07-18 NOTE — Progress Notes (Addendum)
Digital stimulation performed on for five minutes with a large amount of soft stool.Gloves changed and  Pt cleansed. Digital stimulation performed again  for five minutes pt had medium amount of soft mushy stool followed by flatus and scant amount of liquid brown stool.   Gloves changed and Pt cleansed.  Digital stimulation continued for another minute with no results and glove came out without stool present. Pt's wound was redressed with treatment as ordered. Pt was given a bath Bard catheter stat lock changed. Yankauer changed. Splints applied. Heel foam changed. There is some denuded areas on the posterior foot. Prafo boot removed and heels floated. Will ask MD for order for Prevalon boots in bed and prafo for when OOB in wheelchair. Moisturizer applied to feet and skin gently exfoliated. Heel protectors replaced. Peg tube cleansed and redressed. Oral care performed. Pt tolerated well. Pt did state since we had been rolling him back and forth for care that he did not wish to be turned on his side. Pt did say he would consider turning slightly at MN when I come in to do care. Increased risk of pressure injury and worsening of sacral wound explained to patient. Pt also informed of possibility to develop pressure wound on the back of his head. Pt educated to how the decreased sensation makes it harder for the patient to feel discomfort associated with early skin breakdown. Pt stated he let them do it all day and just wanted a break. Will attempt to turn pt at [facility]

## 2020-07-18 NOTE — Progress Notes (Signed)
This chaplain joined the Pt., Pt. wife-Linda, notary, and three witnesses for notarizing of the Pt. Advance Directive: HCPOA and Living Will.  The Pt. verbally authorized the witness to sign his name in front of the witnesses.    The Pt. named Caryl Manas as his healthcare agent and [employer] as the Pt. next choice if the healthcare agent is unable or unwilling to serve. The signed and notarized original AD and copies were given to the Pt.  A copy of the AD was scanned to the Pt. EMR. The chaplain updated the Pt. RN-Mekides.  The chaplain is available for F/U spiritual care as needed.

## 2020-07-19 ENCOUNTER — Inpatient Hospital Stay (HOSPITAL_COMMUNITY): Payer: BC Managed Care – PPO | Admitting: Physical Therapy

## 2020-07-19 ENCOUNTER — Inpatient Hospital Stay (HOSPITAL_COMMUNITY): Payer: BC Managed Care – PPO | Admitting: Speech Pathology

## 2020-07-19 ENCOUNTER — Inpatient Hospital Stay (HOSPITAL_COMMUNITY): Payer: BC Managed Care – PPO

## 2020-07-19 DIAGNOSIS — Z515 Encounter for palliative care: Secondary | ICD-10-CM

## 2020-07-19 DIAGNOSIS — S14109S Unspecified injury at unspecified level of cervical spinal cord, sequela: Secondary | ICD-10-CM | POA: Diagnosis not present

## 2020-07-19 LAB — BASIC METABOLIC PANEL
Anion gap: 11 (ref 5–15)
BUN: 26 mg/dL — ABNORMAL HIGH (ref 8–23)
CO2: 26 mmol/L (ref 22–32)
Calcium: 9.1 mg/dL (ref 8.9–10.3)
Chloride: 102 mmol/L (ref 98–111)
Creatinine, Ser: 0.62 mg/dL (ref 0.61–1.24)
GFR, Estimated: >60 mL/min (ref >60)
Glucose, Bld: 84 mg/dL (ref 70–99)
Potassium: 4.3 mmol/L (ref 3.5–5.1)
Sodium: 139 mmol/L (ref 135–145)

## 2020-07-19 LAB — CBC WITH DIFFERENTIAL/PLATELET
Abs Immature Granulocytes: 0.07 10*3/uL (ref 0.00–0.07)
Basophils Absolute: 0 10*3/uL (ref 0.0–0.1)
Basophils Relative: 0 %
Eosinophils Absolute: 0.1 10*3/uL (ref 0.0–0.5)
Eosinophils Relative: 2 %
HCT: 26.3 % — ABNORMAL LOW (ref 39.0–52.0)
Hemoglobin: 8.1 g/dL — ABNORMAL LOW (ref 13.0–17.0)
Immature Granulocytes: 1 %
Lymphocytes Relative: 15 %
Lymphs Abs: 1.3 10*3/uL (ref 0.7–4.0)
MCH: 27.6 pg (ref 26.0–34.0)
MCHC: 30.8 g/dL (ref 30.0–36.0)
MCV: 89.5 fL (ref 80.0–100.0)
Monocytes Absolute: 1.2 10*3/uL — ABNORMAL HIGH (ref 0.1–1.0)
Monocytes Relative: 14 %
Neutro Abs: 5.9 10*3/uL (ref 1.7–7.7)
Neutrophils Relative %: 68 %
Platelets: 387 10*3/uL (ref 150–400)
RBC: 2.94 MIL/uL — ABNORMAL LOW (ref 4.22–5.81)
RDW: 16.5 % — ABNORMAL HIGH (ref 11.5–15.5)
WBC: 8.6 10*3/uL (ref 4.0–10.5)
nRBC: 0.6 % — ABNORMAL HIGH (ref 0.0–0.2)

## 2020-07-19 LAB — URINALYSIS, ROUTINE W REFLEX MICROSCOPIC
Bilirubin Urine: NEGATIVE
Glucose, UA: NEGATIVE mg/dL
Hgb urine dipstick: NEGATIVE
Ketones, ur: NEGATIVE mg/dL
Nitrite: NEGATIVE
Protein, ur: NEGATIVE mg/dL
Specific Gravity, Urine: 1.012 (ref 1.005–1.030)
WBC, UA: >50 WBC/hpf — ABNORMAL HIGH (ref 0–5)
pH: 5 (ref 5.0–8.0)

## 2020-07-19 LAB — GLUCOSE, CAPILLARY
Glucose-Capillary: 88 mg/dL (ref 70–99)
Glucose-Capillary: 162 mg/dL — ABNORMAL HIGH (ref 70–99)

## 2020-07-19 NOTE — Progress Notes (Signed)
Occupational Therapy Session Note  Patient Details  Name: Ryan Day MRN: [id number] Date of Birth: 1951-04-15  Today's Date: 07/19/2020 OT Individual Time: 1345-1441 OT Individual Time Calculation (min): 56 min    Short Term Goals: Week 4:  OT Short Term Goal 1 (Week 4): STG=LTG 2/2 ELOS  Skilled Therapeutic Interventions/Progress Updates:    Pt resting in TIS w/c upon arrival. Pt requesting to return to bed. Pt became diaphoretic while therapist preparing sling for transfer back to bed with [employer]. Pt c/o feeling hot during transfer. RN notified and attended to pt after transfer to bed. BP 147/64 supine with Ted hose, Ace wraps, and abdominal binder. Wife present. Ongoing education regarding care of pt and bed mobility. OT intervention with focus on bed mobility, directing care, education. Pt remained in bed with RN X 2 attending to needs and continuing education of wife.   Therapy Documentation Precautions:  Precautions Precautions: Cervical Precaution Booklet Issued: No Precaution Comments: C collar on when egde of bed, upright or OOB Required Braces or Orthoses: Cervical Brace Cervical Brace: Hard collar, Other (comment) (C collar when edge of bed, upright or OOB) Restrictions Weight Bearing Restrictions: No Pain:  Pt denies pain this afternoon   Therapy/Group: Individual Therapy  [name] 07/19/2020, 2:46 PM

## 2020-07-19 NOTE — Progress Notes (Signed)
Occupational Therapy Discharge Summary  Patient Details  Name: Ryan Day MRN: [id number] Date of Birth: 1951-02-19  Patient has met 2 of 5 long term goals  Pt progress with functional tasks was minimal during this admission. Pt with no active functional movement of BUE/BLE. Pt requires tot A+2 for bed mobility and all bathing/dressing tasks. Pt requires tot A+2 for sitting balance EOB. Pt is NPO and did not engage in self feeding. Pt's family participated in multiple education sessions focusing on bed mobility, bathing/dressing, hygiene, and transfers with [employer]. Pt became more vocal with directing care during his admission. Patient to discharge at overall Total Assist level.  Patient's care partner is independent to provide the necessary physical and cognitive assistance at discharge.    Reasons goals not met: Pt with very slow progress - pt did d/c home with a power w.c with head controls. Pt continues to have orthostatic BP and requires TEDS, ACES and abdominal binder.   Recommendation:  Patient will benefit from ongoing skilled OT services in home health setting to continue to advance functional skills in the area of BADL and Reduce care partner burden. However at this time home health services were denied by insurance.   Equipment: Hospital bed, Manual Hoyer Lift  Reasons for discharge: discharge from hospital  Patient/family agrees with progress made and goals achieved: Yes  Vision Baseline Vision/History: Wears glasses Wears Glasses: Reading only Patient Visual Report: No change from baseline Vision Assessment?: No apparent visual deficits Perception  Perception: Within Functional Limits Praxis Praxis: Intact Cognition Overall Cognitive Status: Within Functional Limits for tasks assessed Arousal/Alertness: Awake/alert Orientation Level: Oriented X4 Attention: Sustained;Selective Sustained Attention: Appears intact Selective Attention: Appears intact Memory: Appears  intact Awareness: Appears intact Problem Solving: Appears intact Safety/Judgment: Appears intact Sensation Sensation Light Touch: Impaired by gross assessment Peripheral sensation comments: Pt with limited distal sensation on RUE and B feet Light Touch Impaired Details: Impaired RUE;Impaired LUE;Impaired RLE;Impaired LLE Hot/Cold: Impaired by gross assessment Proprioception: Impaired by gross assessment Stereognosis: Impaired by gross assessment Coordination Gross Motor Movements are Fluid and Coordinated: No Fine Motor Movements are Fluid and Coordinated: No Finger Nose Finger Test: unable Motor  Motor Motor: Tetraplegia    Trunk/Postural Assessment  Cervical Assessment Cervical Assessment:  (cervical collar) Thoracic Assessment Thoracic Assessment: Exceptions to [facility] Lumbar Assessment Lumbar Assessment: Exceptions to [facility] Postural Control Postural Control: Deficits on evaluation Righting Reactions: absent/insufficient Protective Responses: absent  Balance Static Sitting Balance Static Sitting - Balance Support: Feet supported;Bilateral upper extremity supported Static Sitting - Level of Assistance: 1: +2 Total assist Extremity/Trunk Assessment RUE Assessment RUE Assessment: Exceptions to [facility] General Strength Comments: shoulder/scapular elevation/depression 3/5; no activaiton proximal-distal RUE Body System: Neuro Brunstrum levels for arm and hand: Arm;Hand Brunstrum level for arm: Stage I Presynergy Brunstrum level for hand: Stage I Flaccidity LUE Assessment LUE Assessment: Exceptions to [facility] General Strength Comments: shoulder/scapular elevation/depression 3/5; no activaiton proximal-distal LUE Body System: Neuro Brunstrum levels for arm and hand: Hand;Arm Brunstrum level for arm: Stage I Presynergy Brunstrum level for hand: Stage I Flaccidity   [name] 07/19/2020, 7:24 AM

## 2020-07-19 NOTE — Progress Notes (Signed)
[place] PHYSICAL MEDICINE & REHABILITATION PROGRESS NOTE   Subjective/Complaints:   Pt's nurse reports PRAFO boots causing skin breakdown on R foot/ankle- asked to switch to prevalon boots- placed order.   A little junky, but have already did quad cough and suctioning via trach.   ROS:   Pt denies SOB, abd pain, CP, N/V/C/D, and vision changes    Objective:   No results found. Recent Labs    07/17/20 0810 07/19/20 1600  WBC 7.7 8.6  HGB 8.1* 8.1*  HCT 25.8* 26.3*  PLT 348 387   Recent Labs    07/18/20 0943 07/19/20 0602  NA 142 139  K 3.5 4.3  CL 106 102  CO2 29 26  GLUCOSE 186* 84  BUN 25* 26*  CREATININE 0.65 0.62  CALCIUM 9.4 9.1    Intake/Output Summary (Last 24 hours) at 07/19/2020 1957 Last data filed at 07/19/2020 1646 Gross per 24 hour  Intake 847 ml  Output 3000 ml  Net -2153 ml     Pressure Injury 06/23/20 Buttocks Right Unstageable - Full thickness tissue loss in which the base of the injury is covered by slough (yellow, tan, gray, green or brown) and/or eschar (tan, brown or black) in the wound bed. (Active)  06/23/20 1838  Location: Buttocks  Location Orientation: Right  Staging: Unstageable - Full thickness tissue loss in which the base of the injury is covered by slough (yellow, tan, gray, green or brown) and/or eschar (tan, brown or black) in the wound bed.  Wound Description (Comments):   Present on Admission: Yes     Pressure Injury 06/23/20 Buttocks Left Unstageable - Full thickness tissue loss in which the base of the injury is covered by slough (yellow, tan, gray, green or brown) and/or eschar (tan, brown or black) in the wound bed. (Active)  06/23/20 1839  Location: Buttocks  Location Orientation: Left  Staging: Unstageable - Full thickness tissue loss in which the base of the injury is covered by slough (yellow, tan, gray, green or brown) and/or eschar (tan, brown or black) in the wound bed.  Wound Description (Comments):     Present on Admission: Yes    Physical Exam: Vital Signs Blood pressure 130/62, pulse 72, temperature 97.6 F (36.4 C), resp. rate 19, height 6\' 5"  (1.956 m), weight 108.3 kg, SpO2 100 %.  Constitutional: No distress . Vital signs reviewed. Awake, alert, laying in bed- PT putting on TEDs HENT: Normocephalic.  Atraumatic. Neck: Capped trach- no change Eyes: EOMI. No discharge. Cardiovascular: RRR Respiratory: less coarse, less junky- s/p suctioning/quad cough; good air movement GI: Soft, NT, ND, (+)BS   + PEG Skin: Warm and dry.  PEG site CDI GU: + Foley Psych: appropriate Musc: No edema in extremities.  No tenderness in extremities. Neuro: Alert , but quiet; legs wrapped in TEDs and ACE wraps Dysphonia Motor: B/l UE shoulder abduction 1+/5, distally 0/5 with ?trace flicker left wrist flexor, unchanged B/l LE: 0/5 proximal to distal, unchanged   Also has a dark bruise on top of R ankle- could be from [facility] boots and a tiny scab and small dark bruise on achilles heel.   Assessment/Plan: 1. 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  Physiatrist is providing close team supervision and 24 hour management of active medical problems listed below.  Physiatrist and rehab team continue to assess barriers to discharge/monitor patient progress toward functional and medical goals  Care Tool:  Bathing              Bathing assist Assist Level: 2 Helpers     Upper Body Dressing/Undressing Upper body dressing   What is the patient wearing?: Pull over shirt    Upper body assist Assist Level: 2 Helpers    Lower Body Dressing/Undressing Lower body dressing      What is the patient wearing?: Pants, Incontinence brief     Lower body assist Assist for lower body dressing: 2 Helpers     Toileting Toileting    Toileting assist Assist for toileting: 2 Helpers      Transfers Chair/bed transfer  Transfers assist  Chair/bed transfer activity did not occur: Safety/medical concerns  Chair/bed transfer assist level: Dependent - mechanical lift     Locomotion Ambulation   Ambulation assist   Ambulation activity did not occur: Safety/medical concerns          Walk 10 feet activity   Assist  Walk 10 feet activity did not occur: Safety/medical concerns        Walk 50 feet activity   Assist Walk 50 feet with 2 turns activity did not occur: Safety/medical concerns         Walk 150 feet activity   Assist Walk 150 feet activity did not occur: Safety/medical concerns         Walk 10 feet on uneven surface  activity   Assist Walk 10 feet on uneven surfaces activity did not occur: Safety/medical concerns         Wheelchair     Assist Will patient use wheelchair at discharge?: Yes Type of Wheelchair: Power Wheelchair activity did not occur: Safety/medical concerns  Wheelchair assist level: Minimal Assistance - Patient > 75% Max wheelchair distance: 57'    Wheelchair 50 feet with 2 turns activity    Assist    Wheelchair 50 feet with 2 turns activity did not occur: Safety/medical concerns   Assist Level: Minimal Assistance - Patient > 75%   Wheelchair 150 feet activity     Assist  Wheelchair 150 feet activity did not occur: Safety/medical concerns       Blood pressure 130/62, pulse 72, temperature 97.6 F (36.4 C), resp. rate 19, height 6\' 5"  (1.956 m), weight 108.3 kg, SpO2 100 %.    Medical Problem List and Plan: 1.  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  Antithrombotics: -DVT/anticoagulation:  Pharmaceutical: Lovenox 10/5- Dopplers (-)  10/10- needs for 3 months from starting it             -antiplatelet therapy: N/A                                                       3. Pain Management: N/A 4. Mood: LCSW to follow for evaluation and support.              -antipsychotic agents: N/A  Appreciate neuropsych consult  Appears stable on 10/24 5. Neuropsych: This patient is capable of making decisions on his own behalf. 6. Sacral decub/Skin/Wound Care: Pictures show wound in different stages of healing. Ordered Air mattress overlay. Continue to reposition q2H.  Continue vitamin and prostat to promote healing. Will add Juven also for collagen.  Increased tube feed to 50 cc/hr as likely has higher nutritional need.  Consulted dietician to help address nutritional needs  10/26- healing well with current regimen- pic as above  10/27- Na 139 and prevalon boots as above  Appreciate WOC eval and recs 7. Fluids/Electrolytes/Nutrition: Monitor I/Os.  8. Orthostatic Hypotension: Secondary to autonomic dysfunction-              Vitals:   07/19/20 1336 07/19/20 1425  BP: 123/76 130/62  Pulse: 78 72  Resp: 19   Temp: 97.6 F (36.4 C)   SpO2: 100%    10/25- on Florinef AND Midodrine, ACE wraps, Abd binder and TEDs- explained will slowly get rid of these things as can tolerate better and better.  10/27- Sx's improving, esp with TEDs, ACE wraps and abd binder and meds 9. Acute on chronic anemia: Improving--continue to monitor. Added iron supplement.              Hgb 8.2 on 10/19, labs ordered for tomorrow  10/25- Hb stable at 8.1   Iron level low- supplement initiated 10. Hypernatremia: Added water flushes. May need low sodium formula.   Na 139 on 10/22  Responding to free H20  10/25- Na 144- but stable- con't regimen  10/26- labs pending for today  10/27- Na 139- much better 11. Neurogenic bladder: Maintain Foley due to sacral wounds.. 12. Neurogenic bowel: Continue senna in am with suppository in evenings for bowel program. Moving bowels regularly   Results with bowel program  10/25- nightly bowel program- getting results 13.  Secretions/Trach/capped: continue scopolamine patch  Capped trach  10/20: CXR is stable.   Encourage IS.   10/25- need to continue trach due to intermittent need for suctioning- concerned if removed, won't be able to suction when needed  10/26- cannot remove trach- still needing for suctioning 14. Azotemia/NPO- getting TFs  Improving 15. W/C requirement- will need either head array OR chin control power w/c- w/c rep/company- working on getting a demo for him.   16. Hypokalemia  K+4.1 on 10/22 17. Spasticity  Baclofen 5 mg TID with benefit 18. Retracted foreskin with chronic foley, no sign of ischemia, edema posterior foreskin, may be related to increased peripheral edema although no scrotal swelling - Uro on call Dr Sheppard Coil, felt that Uro eval was not needed at this time 19. Disposition: family meeting held - goal is DC home. Given his quadriparesis, [name] requires a hospital bed with hoyer lift, low airloss mattress, and a power wheelchair.    Pt will require: 1. Hospital bed- semi-electric due to need for positioning for TFs, and to transfer pt to his power w/c.  2. He needs hoyer lift with XL sling due to his size- he's 6 ft 5 inches tall and >200 lbs- he's quadriplegia with NO movement of arms or legs- he needs to be transferred by his wife and she cannot do so without a hoyer lift.  3. He needs a low air loss mattress due to unstageable to stage III pressure ulcer on his buttocks (it is currently  A mixed staged ulcer)- to prevent from developing a STAGE IV ulcer, he truly needs this mattress. 4. Power w/c with a head control or chin control due to being a C4 quadriplegic- he has NO significant movement of arms or legs- and therefore, HAS to be able to mobilize to do pressure relief, esp because of Unstageable/stage III  pressure ulcer as well as be able to move from place to place, these are the only options-   I also spoke to [facility] today and they extended pt to 10/29 to get home.    I spent a total of 30 minutes on care today as detailed above.    LOS: 26 days A FACE TO FACE EVALUATION WAS PERFORMED  Quincey Nored 07/19/2020, 7:57 PM

## 2020-07-19 NOTE — Progress Notes (Signed)
Provided education to patient's spouse:  1) Spouse was able to administer medications and tube feed through peg tube with verbal cueing to remember to flush before and after meds/feed  2) Demonstrated bowel program, dig stim with spouse. She was able to perform dig stim after observation with verbal cues. Spouse completed cleaning up after program, replacing brief and bed pad.  3) Demonstrated wound care, including purpose of santyl, moist to dry dressing application and foam application. Spouse assisted with dressing change.  4) discussed signs and symptoms of Autonomic dysreflexia with patient and spouse and provided handout.  5) Spouse stated she had performed trach suctioning and replaced gauze around trach site prior and was gaining comfort with procedure  6) demonstrated floating heels and other measures to avoid skin breakdown. Spouse verbalized understanding  Family eager for education, verbalized comfort and that she would get used to new routine in time.

## 2020-07-19 NOTE — Progress Notes (Signed)
Physical Therapy Discharge Summary  Patient Details  Name: Ryan Day MRN: [id number] Date of Birth: Jul 11, 1951   Patient has met 4 of 6 long term goals due to improved activity tolerance and increased ability to self advocate and instruct family members on how to assist him with care.  Patient to discharge at a wheelchair level Total Assist.   Patient's care partner is independent to provide the necessary physical assistance at discharge. [name] has completed extensive hands on training in order to assist pt safely upon d/c home.  Reasons goals not met: Pt met power w/c mobility goals adequately for safe d/c home, pt did not meet sitting balance goal of min A as he requires total A for sitting balance. Pt did not meet transfer goal of mod I as he is dependent via lift for transfers.  Recommendation:  Patient will benefit from ongoing skilled PT services in home health setting to continue to advance safe functional mobility, address ongoing impairments in endurance, strength, safety, balance, independence with functional mobility, independence with power wheelchair mobility, and minimize fall risk.  Equipment: air mattress hospital bed, manual hoyer lift with XL sling, loaner power wheelchair from [employer]  Reasons for discharge: treatment goals met and discharge from hospital  Patient/family agrees with progress made and goals achieved: Yes  PT Discharge Precautions/Restrictions Precautions Precautions: Cervical Precaution Comments: C collar on when egde of bed, upright or OOB Required Braces or Orthoses: Cervical Brace Cervical Brace: Hard collar;Other (comment) Restrictions Weight Bearing Restrictions: No Vital Signs Therapy Vitals Pulse Rate: 72 BP: 130/62 Pain Pain Assessment Pain Scale: 0-10 Pain Score: 0-No pain Vision/Perception  Perception Perception: Within Functional Limits Praxis Praxis: Intact  Cognition Overall Cognitive Status: Within Functional Limits for  tasks assessed Arousal/Alertness: Awake/alert Orientation Level: Oriented X4 Attention: Sustained;Selective Sustained Attention: Appears intact Selective Attention: Appears intact Memory: Appears intact Awareness: Appears intact Problem Solving: Appears intact Safety/Judgment: Appears intact Sensation Sensation Light Touch: Impaired Detail Light Touch Impaired Details: Impaired RUE;Impaired LUE;Impaired RLE;Impaired LLE Proprioception: Impaired by gross assessment Coordination Gross Motor Movements are Fluid and Coordinated: No Fine Motor Movements are Fluid and Coordinated: No Motor  Motor Motor: Tetraplegia Motor - Discharge Observations: tetraplegia  Mobility Bed Mobility Bed Mobility: Rolling Right;Rolling Left;Supine to Sit;Sit to Supine Rolling Right: Dependent - Patient equal 0% Rolling Left: Dependent - Patient equal 0% Right Sidelying to Sit: 2 Helpers Supine to Sit: Dependent - Patient equal 0% Sit to Supine: Dependent - Patient equal 0% Transfers Transfers: Transfer via Public house manager via Lift Equipment: Maximove;Maxisky Locomotion  Gait Ambulation: No Gait Gait: No Stairs / Additional Locomotion Stairs: No Architect: Yes Wheelchair Assistance: Minimal assistance - Patient >75% Environmental health practitioner: Power Wheelchair Parts Management: Needs assistance Distance: 70  Trunk/Postural Assessment  Cervical Assessment Cervical Assessment: Exceptions to [facility] (hard collar) Thoracic Assessment Thoracic Assessment: Exceptions to [facility] (rounded shoulders) Lumbar Assessment Lumbar Assessment: Exceptions to [facility] (posterior pelvic tilt) Postural Control Postural Control: Deficits on evaluation Righting Reactions: absent/insufficient Protective Responses: absent  Balance Balance Balance Assessed: Yes Static Sitting Balance Static Sitting - Balance Support: Feet supported;Bilateral upper extremity supported Static Sitting -  Level of Assistance: 1: +2 Total assist Extremity Assessment   RLE Assessment RLE Assessment: Exceptions to [facility] Passive Range of Motion (PROM) Comments: decreased DF General Strength Comments: impaired, see below RLE Strength RLE Overall Strength: Deficits RLE Overall Strength Comments: 0/5 RLE Tone RLE Tone: Flaccid RLE Tone Comments: occasional spasms LLE Assessment LLE Assessment: Exceptions to [facility]  Passive Range of Motion (PROM) Comments: decreased DF General Strength Comments: impaired, see below LLE Strength LLE Overall Strength: Deficits LLE Overall Strength Comments: 0/5 LLE Tone LLE Tone: Flaccid LLE Tone Comments: occasional spasms     Excell Seltzer, PT, DPT 07/19/2020, 6:12 PM

## 2020-07-19 NOTE — Progress Notes (Signed)
Physical Therapy Session Note  Patient Details  Name: Ryan Day MRN: [id number] Date of Birth: 1951-02-28  Today's Date: 07/19/2020 PT Individual Time: 0800-0915; 1530-1540 PT Individual Time Calculation (min): 75 min and 10 min   Short Term Goals: Week 3:  PT Short Term Goal 1 (Week 3): =LTG due to ELOS  Skilled Therapeutic Interventions/Progress Updates:    Session 1: Pt received supine in bed, agreeable to PT session. Nursing present in room as well at beginning of therapy session. Both nursing and this therapist assisted pt with quad coughing x 3 due to increased congestion this AM. Nursing assists pt with trach suctioning. Supine BP 124/70.  Pt is dependent to don BLE thigh high TEDs and ACE wrap. Rolling L/R with total A x 2 for placement of abdominal binder and maxi move sling. Pt becomes diaphoretic while in bed and reports feeling "hot". Seated BP 146/80. Provided cool cloth to forehead and assessed tightness of clothing, checked foley for occlusion, etc. Maxi move transfer to [place] chair. While seated in semi-reclined position in TIS chair BP 117/67. Pt reports improvement in symptoms of feeling hot while sitting up in chair. Pt left semi-reclined in TIS w/c in room with needs in reach at end of session.  Session 2: Pt received seated in bed, no complaints of pain. Per OT pt experienced another episode of diaphoresis during his PM session, nursing aware. Semi-reclined BP 97/60 with no AD. Re-assess pt's LE MMT and sensation, see d/c note and Flowsheet for details. Pt declines any further participation in PM session due to fatigue. Pt left semi-reclined in bed with needs in reach. Pt missed 35 min of scheduled therapy session due to fatigue.  Therapy Documentation Precautions:  Precautions Precautions: Cervical Precaution Booklet Issued: No Precaution Comments: C collar on when egde of bed, upright or OOB Required Braces or Orthoses: Cervical Brace Cervical Brace: Hard collar, Other  (comment) (C collar when edge of bed, upright or OOB) Restrictions Weight Bearing Restrictions: Yes General: PT Amount of Missed Time (min): 35 Minutes PT Missed Treatment Reason: Patient fatigue;Patient unwilling to participate    Therapy/Group: Individual Therapy   Excell Seltzer, PT, DPT  07/19/2020, 6:06 PM

## 2020-07-19 NOTE — Progress Notes (Addendum)
Patient ID: Ryan Day, male   DOB: [birth year], 69 y.o.   MRN: [id number]  SW received updates from [name] reporting still waiting on update from insurance on if they will accept for private duty nursing. Also reports his referral is being sent to their [facility] since this [place].   SW received message from [name]/Admissions with [facility] [phone]) reporting that insurance approved his SNF and wanted to know if pt still needs placement.   SW called pt wife [name] [phone]) provide updates on above. SW discussed if family remains confident with taking pt home despite no services being in place. Wife states yes. SW informed on pt being approved for SNF placement, and even if they did not want [facility], insurance accepted. Wife declined stating she was respecting his wishes of taking him home. Pt wife aware SW will continue to work on private duty nursing task even after discharge.   SW spoke with [name]/Admissions with [facility] [phone]) to inform on decline of SNF placement. Reports she will inform insurance on the decline.   SW confirmed with [name] that enteral feeds (Osmolite/prosource) will be delivered to patient here at hospital prior to discharge. SW sent trach form to [name].   SW confirmed with [place] OPT, SLP referral received. SW to make efforts to schedule MBS  On or after 08/03/2020 with scheduling [phone]). SW left message with scheduling department.  SW spoke with [name]/RN with [facility] [phone]) who works with [name]. Apologized for declining to be able to work with pt as they do not have the appropriate equipment to assist/aide with providing his care needs. They are required to have their own equipment (I.e. suction), and cannot rely on patients. Suggested ENT in [facility] for further management.  SW spoke with [name][facility]  ([phone]/f:[phone]) who reports that she will speak with physician and follow-up with SW.  *SW spoke with [name] who will schedule appointment for pt to be seen with Dr. Clyde Canterbury on Monday, November 8th at 1:30pm. Pt is required to bring all supplies with him (trach supplies/portable suction) at time of appointment. SW to fax clinical documents. Sw called pt wife to inform on above appointment.  [name], MSW, [place] Office: [phone] Cell: [phone] Fax: [phone]

## 2020-07-19 NOTE — Plan of Care (Signed)
Significant other in today and performed the bowel program with the day shift nurse Tensas.

## 2020-07-19 NOTE — Progress Notes (Signed)
Bowel program performed. Small volume of stool removed with dig stim, rectalvault empty, suppository placed./  Patient became diaphoretic during therapy and was transferred back to bed. Blood pressure systolic 741, HR 78, mild headache had been treated with tylenol in the prior hour. Ted hose removed and patient's foley, skin and rectal vault checked. Sweating stopped and BP went down. Reported to PA, orders given for UA, culture and CBC. After interventions, patient reported being more comfortable. Monitored and reported to oncoming RN

## 2020-07-19 NOTE — Progress Notes (Signed)
Orthopedic Tech Progress Note Patient Details:  [name] 05-Jan-1951 [id number] Called floor RN was in meeting so I spoke with Network engineer. Outside finder does not carry prevalon shoes materials do. Patient ID: Ryan Day, male   DOB: 05/24/1951, 69 y.o.   MRN: [id number]   Ellouise Newer 07/19/2020, 10:55 AM

## 2020-07-19 NOTE — Progress Notes (Signed)
Speech Language Pathology Daily Session Note  Patient Details  Name: Ryan Day MRN: [id number] Date of Birth: 09/26/1950  Today's Date: 07/19/2020 SLP Individual Time: 1100-1130 SLP Individual Time Calculation (min): 30 min  Short Term Goals: Week 4: SLP Short Term Goal 1 (Week 4): STG's=LTG's (ELOS discharge 10/28)  Skilled Therapeutic Interventions:   Patient seen with wife present at end to address swallow goals. Patient educated on swallow safety and he did not have any questions. He was unaware of outpatient swallow study that was scheduled for November 11th by other SLP despite her telling him this previous date. Patient tolerated ice chips without overt throat clearing or coughing and of note voice was clear throughout. Plan is for patient to discharge home next date but unfortunately without any HH services. (Education officer, museum is still working on this).   Pain Pain Assessment Pain Scale: 0-10 Pain Score: 0-No pain  Therapy/Group: Individual Therapy  Sonia Baller, MA, CCC-SLP Speech Therapy

## 2020-07-20 DIAGNOSIS — S14109S Unspecified injury at unspecified level of cervical spinal cord, sequela: Secondary | ICD-10-CM | POA: Diagnosis not present

## 2020-07-20 LAB — GLUCOSE, CAPILLARY: Glucose-Capillary: 84 mg/dL (ref 70–99)

## 2020-07-20 MED ORDER — BISACODYL 10 MG RE SUPP: 10.0000 mg | Freq: Every day | RECTAL | 2 refills | Status: AC

## 2020-07-20 MED ORDER — CHLORHEXIDINE GLUCONATE 0.12 % MT SOLN: 15.0000 mL | Freq: Two times a day (BID) | OROMUCOSAL | 0 refills | Status: DC

## 2020-07-20 MED ORDER — FLUDROCORTISONE ACETATE 0.1 MG PO TABS: 0.2000 mg | ORAL_TABLET | Freq: Every day | ORAL | 2 refills | Status: DC

## 2020-07-20 MED ORDER — LIDOCAINE 5 % EX PTCH: 1.0000 | MEDICATED_PATCH | CUTANEOUS | 0 refills | Status: AC

## 2020-07-20 MED ORDER — BACLOFEN 5 MG PO TABS: 5.0000 mg | ORAL_TABLET | Freq: Three times a day (TID) | ORAL | 0 refills | Status: DC

## 2020-07-20 MED ORDER — ONDANSETRON HCL 4 MG PO TABS: 4.0000 mg | ORAL_TABLET | Freq: Three times a day (TID) | ORAL | 0 refills | Status: AC | PRN

## 2020-07-20 MED ORDER — ORAL CARE MOUTH RINSE: 15.0000 mL | Freq: Two times a day (BID) | OROMUCOSAL | 2 refills | Status: DC

## 2020-07-20 MED ORDER — TRAZODONE HCL 50 MG PO TABS: 50.0000 mg | ORAL_TABLET | Freq: Every day | ORAL | 2 refills | Status: DC

## 2020-07-20 MED ORDER — JUVEN PO PACK: 1.0000 | PACK | Freq: Two times a day (BID) | ORAL | 2 refills | Status: DC

## 2020-07-20 MED ORDER — ZINC SULFATE 220 (50 ZN) MG PO CAPS: 220.0000 mg | ORAL_CAPSULE | Freq: Every day | ORAL | 2 refills | Status: AC

## 2020-07-20 MED ORDER — SACCHAROMYCES BOULARDII 250 MG PO CAPS: 250.0000 mg | ORAL_CAPSULE | Freq: Two times a day (BID) | ORAL | 1 refills | Status: AC

## 2020-07-20 MED ORDER — ENOXAPARIN SODIUM 40 MG/0.4ML ~~LOC~~ SOLN: 40.0000 mg | SUBCUTANEOUS | 2 refills | Status: DC

## 2020-07-20 MED ORDER — PROSOURCE TF PO LIQD: 45.0000 mL | Freq: Two times a day (BID) | ORAL | 2 refills | Status: DC

## 2020-07-20 MED ORDER — GUAIFENESIN 200 MG PO TABS: 400.0000 mg | ORAL_TABLET | Freq: Four times a day (QID) | ORAL | 0 refills | Status: AC

## 2020-07-20 MED ORDER — FAMOTIDINE 20 MG PO TABS: 20.0000 mg | ORAL_TABLET | Freq: Every day | ORAL | 2 refills | Status: AC

## 2020-07-20 MED ORDER — MIDODRINE HCL 10 MG PO TABS: 10.0000 mg | ORAL_TABLET | Freq: Three times a day (TID) | ORAL | 2 refills | Status: DC

## 2020-07-20 MED ORDER — CALCIUM POLYCARBOPHIL 625 MG PO TABS: 625.0000 mg | ORAL_TABLET | Freq: Every day | ORAL | 2 refills | Status: AC

## 2020-07-20 MED ORDER — SIMETHICONE 40 MG/0.6ML PO SUSP: 80.0000 mg | Freq: Four times a day (QID) | ORAL | 0 refills | Status: AC

## 2020-07-20 MED ORDER — SENNOSIDES 8.8 MG/5ML PO SYRP: 5.0000 mL | ORAL_SOLUTION | Freq: Every day | ORAL | 1 refills | Status: AC

## 2020-07-20 MED ORDER — COLLAGENASE 250 UNIT/GM EX OINT: TOPICAL_OINTMENT | Freq: Every day | CUTANEOUS | 3 refills | Status: DC

## 2020-07-20 MED ORDER — SCOPOLAMINE 1 MG/3DAYS TD PT72: 1.0000 | MEDICATED_PATCH | TRANSDERMAL | 12 refills | Status: DC

## 2020-07-20 MED ORDER — FREE WATER: 400.0000 mL | Status: AC

## 2020-07-20 MED ORDER — FLUOXETINE HCL 20 MG/5ML PO SOLN: 40.0000 mg | Freq: Every day | ORAL | 0 refills | Status: DC

## 2020-07-20 MED ORDER — OSMOLITE 1.5 CAL PO LIQD: 474.0000 mL | Freq: Four times a day (QID) | ORAL | 0 refills | Status: DC

## 2020-07-20 MED ORDER — ADULT MULTIVITAMIN LIQUID CH: 5.0000 mL | Freq: Every day | ORAL | 1 refills | Status: DC

## 2020-07-20 MED ORDER — FERROUS SULFATE 300 (60 FE) MG/5ML PO SYRP: 75.0000 mg | ORAL_SOLUTION | Freq: Every day | ORAL | 0 refills | Status: AC

## 2020-07-20 NOTE — Progress Notes (Addendum)
Speech Language Pathology Discharge Summary  Patient Details  Name: Ryan Day MRN: [id number] Date of Birth: 1951-08-09  Today's Date: 07/20/2020    Patient has met 3 of 4 long term goals.  Patient to discharge at overall Supervision level.  Reasons goals not met: patient continues with slow recovery of swallow function and without significant improvement as per comparison of MBS studies   Clinical Impression/Discharge Summary: Patient met 3/4 LTG's during this rehab stay and made very good progress with improving his speech and voice function and is able to communicate at phrase, sentence, conversational level with mod I, though voice continues to be hoarse and low vocal intensity. Patient continues to be NPO however he is tolerating ice chips PRN after oral care with minimal overt s/s of aspiration. He does require suctioning of pharyngeal secretions but is tolerating having trach capped 24/7. Patient has been scheduled for outpatient MBS on November 11th.  Care Partner:  Caregiver Able to Provide Assistance: Yes  Type of Caregiver Assistance: Physical  Recommendation:  Home Health SLP;Outpatient SLP;24 hour supervision/assistance  Rationale for SLP Follow Up: Maximize functional communication;Maximize swallowing safety   Equipment: N/A for speech   Reasons for discharge: Discharged from hospital   Patient/Family Agrees with Progress Made and Goals Achieved: Yes    Sonia Baller, MA, CCC-SLP Speech Therapy

## 2020-07-20 NOTE — Progress Notes (Signed)
[facility] ([facility])  Hospital Liaison: RN note         Notified by TOC manager of patient/family request for [facility] Palliative services at home after discharge.                  [facility] Palliative team will follow up with patient after discharge.         Please call with any hospice or palliative related questions.         Thank you for this referral.         Mary Anne Robertson, RN, CCM  [facility] Hospital Liaison (listed on [facility] under Hospice[facility])    [phone]   

## 2020-07-20 NOTE — Progress Notes (Signed)
Inpatient Rehabilitation Care Coordinator  Discharge Note  The overall goal for the admission was met for:   Discharge location: Yes. Pt discharge to home via [facility] ambulance. Pt to have 24/7 care from his wife and children to aide as well.   Length of Stay: Yes. 26 days.   Discharge activity level: Yes. Total care.   Home/community participation: Yes. Limited.   Services provided included: MD, RD, PT, OT, SLP, RN, CM, TR, Pharmacy, Neuropsych and SW  Financial Services: Private Insurance: [employer] [place] and Other: Medicare Part A  Follow-up services arranged: Home Health: Pt unable to get [facility] services due to insurance and/or staffing constraints. SW will continue to work on services after discharge. , Outpatient: [facility] for SP-repeat modified barium swallow study after 08/03/20 and DME: [facility] for hospital bed with low air loss mattress, hoyer lift with XL sling, enteral feeds- Osmolite 1.5 and prosource (to be delivered to the home); [employer]- power wheelchair  Comments (or additional information): contact pt wife [name] [phone]  Patient/Family verbalized understanding of follow-up arrangements: Yes  Individual responsible for coordination of the follow-up plan: Pt to have assistance with care needs.   Confirmed correct DME delivered: Rana Snare 07/20/2020    [name], MSW, [place] Office: [phone] Cell: [phone] Fax: [phone]

## 2020-07-20 NOTE — Progress Notes (Signed)
Patient discharged via PTAR. Family education completed. Family stated they were comfortable giving lovenox shot. All belongings packed. Patient clean and dry as of discharge, dressing over sacral wound in tact, no pain reported.

## 2020-07-20 NOTE — Progress Notes (Signed)
Pt was found with moderate amount of yellow brown mushy stool present pt was cleansed and rectal vault checked for stool. Digital stimulation performed for four minutes with small amount of mushy stool mixed with some liquid stool. Pt was cleansed and digital stimulation performed for three minutes with a miniscule amount of liquid stool. Pt cleansed rectal vault checked and there was no evidence of stool on writers glove.  Pt was given a night bath, Eucerin cream applied to feet and legs. Dry skin exfoliated from soles of both feet. Heel protectors applied bilaterally and prevalon boots applied to BLE and raised on pillows, hand splints applied bilaterally and arms raised onto pillows. Oral care performed, outer trach and neck cleansed new dressing applied to trach site. Pt tolerated well. Denies need for tracheal suctioning at this time. Lungs are clear and diminished in bases bilaterally. Trach remains capped and pt is tolerating well with POX saturation >= 96%. Peg tube site cleansed with NS and dried. Small amount of scaly skin present at outer edge of holder gently exfoliated with bath and new split gauze applied. Dressing to sacrum performed secondary to soiling. There is still yellow exudate present at proximal area of right sacral wound and small area approximately 0.7 cm x 0.4 of black eschar present mid wound. The left sacral/buttock wound has  Epithelial tissue present and is without drainage. Foam applied.  Wounds measured and charted. Pt given back rub with lotion.  Pt was educated to the foley being replaced prior to discharge tomorrow. Old foley removed without incident. #16 FR straight tip foley catheter inserted per order. Yellow urine (with small amount of tiny brown flecks initially present) returned immediately. Sterile technique followed throughout. Foreskin pulled down over penis head. Pt tolerated well. Pt did not want to be turned onto his side afterward, pt was told that since he had been  turned multiple times and was on his side for approximately 30 minutes during bowel program that I will be willing to wait until MN to reposition. Pt asked if he had any questions related to discharge tomorrow and denied any at present. Pt in bed side rails up sip and blow call system within reach of patient along with [place] sx.

## 2020-07-20 NOTE — Discharge Instructions (Signed)
Inpatient Rehab Discharge Instructions  Ryan Day Discharge date and time: 07/20/20   Activities/Precautions/ Functional Status: Activity: activity as tolerated Diet: nothing by mouth. Wound Care: Remove dressing, cleanse sacral area with water (soap and water if soiled) and pat dry, then apply santyl to yellow slough/tissue.  Cover with damp to dry dressing.    Functional status:  ___ No restrictions     ___ Walk up steps independently _X__ 24/7 supervision/assistance   ___ Walk up steps with assistance ___ Intermittent supervision/assistance  ___ Bathe/dress independently ___ Walk with walker     ___ Bathe/dress with assistance ___ Walk Independently    ___ Shower independently ___ Walk with assistance    ___ Shower with assistance __X_ No alcohol     ___ Return to work/school ________   Special Instructions: 1. Pressure relief measure every 20 minutes when in chair. Turn every 2 hours when in bed. Needs to be upright for tube feeds and have patient stay up 30 minutes after that 2. Suction frequently. Use flutter valve every 3-4 hours while awake. Quad cough at least  4 times a day and more often if needed to clear secretions.  3. You will need to purchase these items over the counter:                                    ----Multivitamin, Vitamin C, Zinc, Robitussin, lidocaine patches, Mouth wash, probiotics, Senna (plain), dulcolax suppository and Simethicone. 4. Foley care needs to be done twice a day and after BM or if soiled    Please bring this form and your medication list with you to all your follow-up doctor's appointments.   COMMUNITY REFERRALS UPON DISCHARGE:    Outpatient:    [facility]             Agency: [facility]  Phone: [phone]              Appointment Date/Time: requested appointment for November 11 for    Medical Equipment/Items Ordered: hospital bed with low airloss mattress, hoyer lift, enteral/tube feeds                                                  Agency/Supplier: [facility] [phone]  Medical Equipment/Items Ordered: enteral/tube feeds for Osmolite 1.5 and prosource                                                 Agency/Supplier: [facility] # [phone], then option 1. OR email:  [email or web address].   Medical Equipment/Items Ordered: power wheelchair                                                 Agency/Supplier: [facility] [phone]  GENERAL COMMUNITY RESOURCES FOR PATIENT/FAMILY: Currently on wait list for HomeCare Providers at [facility] [phone]) to get assistance with home aide care. Reports there is atleast a one year wait list. Please continue to call to get updates on status of referral.  A referral for [employer]-  Registry was submitted. Call the access line at [phone] to check status of referral submitted.   My questions have been answered and I understand these instructions. I will adhere to these goals and the provided educational materials after my discharge from the hospital.  Patient/Caregiver Signature _______________________________ Date __________  Clinician Signature _______________________________________ Date __________

## 2020-07-20 NOTE — Progress Notes (Signed)
[place] PHYSICAL MEDICINE & REHABILITATION PROGRESS NOTE   Subjective/Complaints:   Foley changed last night.  Pt's had U/A that was (+) for Leuks- but (-) for nitrites.   Likely colonization-due to having Foley for >30 days in SCI patient-  but not UTI. WBC is NORMAL, with no left shift, so NOT UTI.        ROS:   Pt denies SOB, abd pain, CP, N/V/C/D, and vision changes   Objective:   No results found. Recent Labs    07/19/20 1600  WBC 8.6  HGB 8.1*  HCT 26.3*  PLT 387   Recent Labs    07/18/20 0943 07/19/20 0602  NA 142 139  K 3.5 4.3  CL 106 102  CO2 29 26  GLUCOSE 186* 84  BUN 25* 26*  CREATININE 0.65 0.62  CALCIUM 9.4 9.1    Intake/Output Summary (Last 24 hours) at 07/20/2020 0920 Last data filed at 07/20/2020 6010 Gross per 24 hour  Intake 948 ml  Output 3025 ml  Net -2077 ml     Pressure Injury 06/23/20 Buttocks Right Unstageable - Full thickness tissue loss in which the base of the injury is covered by slough (yellow, tan, gray, green or brown) and/or eschar (tan, brown or black) in the wound bed. (Active)  06/23/20 1838  Location: Buttocks  Location Orientation: Right  Staging: Unstageable - Full thickness tissue loss in which the base of the injury is covered by slough (yellow, tan, gray, green or brown) and/or eschar (tan, brown or black) in the wound bed.  Wound Description (Comments):   Present on Admission: Yes     Pressure Injury 06/23/20 Buttocks Left Unstageable - Full thickness tissue loss in which the base of the injury is covered by slough (yellow, tan, gray, green or brown) and/or eschar (tan, brown or black) in the wound bed. (Active)  06/23/20 1839  Location: Buttocks  Location Orientation: Left  Staging: Unstageable - Full thickness tissue loss in which the base of the injury is covered by slough (yellow, tan, gray, green or brown) and/or eschar (tan, brown or black) in the wound bed.  Wound Description (Comments):     Present on Admission: Yes    Physical Exam: Vital Signs Blood pressure (!) 120/56, pulse 84, temperature 97.9 F (36.6 C), resp. rate 15, height 6\' 5"  (1.956 m), weight 108.3 kg, SpO2 94 %.  Constitutional: No distress . Vital signs reviewed. Awake, alert, appropriate, laying in bed, NAD HENT: Normocephalic.  Atraumatic. Neck: Capped trach- no change Eyes: EOMI. No discharge. Cardiovascular: RRR Respiratory: less coarse, good air movement GI: Soft, NT, ND, (+)BS   + PEG Skin: Warm and dry.  PEG site CDI GU: + Foley Psych: quiet Musc: No edema in extremities.  No tenderness in extremities. Neuro: Alert , but quiet; legs wrapped in TEDs and ACE wraps Dysphonia Motor: B/l UE shoulder abduction 1+/5, distally 0/5 with ?trace flicker left wrist flexor, unchanged B/l LE: 0/5 proximal to distal, unchanged   Also has a dark bruise on top of R ankle- could be from [facility] boots and a tiny scab and small dark bruise on achilles heel.   Assessment/Plan: 1. 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  Physiatrist is providing close team supervision and 24 hour management of active medical problems listed below.  Physiatrist and rehab team continue to assess barriers to discharge/monitor patient progress toward functional and medical  goals  Care Tool:  Bathing              Bathing assist Assist Level: 2 Helpers     Upper Body Dressing/Undressing Upper body dressing   What is the patient wearing?: Pull over shirt    Upper body assist Assist Level: 2 Helpers    Lower Body Dressing/Undressing Lower body dressing      What is the patient wearing?: Pants, Incontinence brief     Lower body assist Assist for lower body dressing: 2 Helpers     Toileting Toileting    Toileting assist Assist for toileting: 2 Helpers     Transfers Chair/bed transfer  Transfers  assist  Chair/bed transfer activity did not occur: Safety/medical concerns  Chair/bed transfer assist level: Dependent - mechanical lift     Locomotion Ambulation   Ambulation assist   Ambulation activity did not occur: Safety/medical concerns          Walk 10 feet activity   Assist  Walk 10 feet activity did not occur: Safety/medical concerns        Walk 50 feet activity   Assist Walk 50 feet with 2 turns activity did not occur: Safety/medical concerns         Walk 150 feet activity   Assist Walk 150 feet activity did not occur: Safety/medical concerns         Walk 10 feet on uneven surface  activity   Assist Walk 10 feet on uneven surfaces activity did not occur: Safety/medical concerns         Wheelchair     Assist Will patient use wheelchair at discharge?: Yes Type of Wheelchair: Power Wheelchair activity did not occur: Safety/medical concerns  Wheelchair assist level: Minimal Assistance - Patient > 75% Max wheelchair distance: 22'    Wheelchair 50 feet with 2 turns activity    Assist    Wheelchair 50 feet with 2 turns activity did not occur: Safety/medical concerns   Assist Level: Minimal Assistance - Patient > 75%   Wheelchair 150 feet activity     Assist  Wheelchair 150 feet activity did not occur: Safety/medical concerns       Blood pressure (!) 120/56, pulse 84, temperature 97.9 F (36.6 C), resp. rate 15, height 6\' 5"  (1.956 m), weight 108.3 kg, SpO2 94 %.    Medical Problem List and Plan: 1.  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  Antithrombotics: -DVT/anticoagulation:  Pharmaceutical: Lovenox 10/5- Dopplers (-)  10/10- needs for 3 months from starting it             -antiplatelet therapy: N/A                                                       3. Pain Management: N/A 4. Mood: LCSW to follow for evaluation and support.              -antipsychotic agents: N/A  Appreciate neuropsych consult  Appears stable on 10/24 5. Neuropsych: This patient is capable of making decisions on his own behalf. 6. Sacral decub/Skin/Wound Care: Pictures show wound in different stages of healing. Ordered  Air mattress overlay. Continue to reposition q2H. Continue vitamin and prostat to promote healing. Will add Juven also for collagen.  Increased tube feed to 50 cc/hr as likely has higher nutritional need.  Consulted dietician to help address nutritional needs  10/26- healing well with current regimen- pic as above  10/27- Na 139 and prevalon boots as above  Appreciate WOC eval and recs 7. Fluids/Electrolytes/Nutrition: Monitor I/Os.  8. Orthostatic Hypotension: Secondary to autonomic dysfunction-              Vitals:   07/19/20 1957 07/20/20 0558  BP: 112/62 (!) 120/56  Pulse: 71 84  Resp: 18 15  Temp: 97.6 F (36.4 C) 97.9 F (36.6 C)  SpO2: 99% 94%   10/25- on Florinef AND Midodrine, ACE wraps, Abd binder and TEDs- explained will slowly get rid of these things as can tolerate better and better.  10/27- Sx's improving, esp with TEDs, ACE wraps and abd binder and meds  9. Acute on chronic anemia: Improving--continue to monitor. Added iron supplement.              Hgb 8.2 on 10/19, labs ordered for tomorrow  10/25- Hb stable at 8.1   Iron level low- supplement initiated 10. Hypernatremia: Added water flushes. May need low sodium formula.   Na 139 on 10/22  Responding to free H20  10/25- Na 144- but stable- con't regimen  10/26- labs pending for today  10/27- Na 139- much better 11. Neurogenic bladder: Maintain Foley due to sacral wounds.. 12. Neurogenic bowel: Continue senna in am with suppository in evenings for bowel program. Moving bowels regularly   Results with bowel program  10/25- nightly bowel program- getting  results  10/28- wife trained to do bowel program 13. Secretions/Trach/capped: continue scopolamine patch  Capped trach  10/20: CXR is stable.   Encourage IS.   10/25- need to continue trach due to intermittent need for suctioning- concerned if removed, won't be able to suction when needed  10/26- cannot remove trach- still needing for suctioning  10/28- needs Pulmonary- setting up appointment- for after d/c.  14. Azotemia/NPO- getting TFs  Improving 15. W/C requirement- will need either head array OR chin control power w/c- w/c rep/company- working on getting a demo for him.   16. Hypokalemia  K+4.1 on 10/22 17. Spasticity  Baclofen 5 mg TID with benefit 18. Retracted foreskin with chronic foley, no sign of ischemia, edema posterior foreskin, may be related to increased peripheral edema although no scrotal swelling - Uro on call Dr Sheppard Coil, felt that Uro eval was not needed at this time 19. Disposition: family meeting held - goal is DC home. Given his quadriparesis, Mr. Vitanza requires a hospital bed with hoyer lift, low airloss mattress, and a power wheelchair.   10/28- pt discharging by ambulance today- will also make sure wife taught how to do Lovenox injections- needs for another 2 weeks after d/c.    Pt will require: 1. Hospital bed- semi-electric due to need for positioning for TFs, and to transfer pt to his power w/c.  2. He needs hoyer lift with XL sling due to his size- he's 6 ft 5 inches tall and >200 lbs- he's quadriplegia with NO movement of arms or legs- he needs to be transferred by his wife and she cannot do so without a hoyer lift.  3. He needs a low air loss mattress due to unstageable to stage III pressure ulcer on his buttocks (it is currently  A mixed  staged ulcer)- to prevent from developing a STAGE IV ulcer, he truly needs this mattress. 4. Power w/c with a head control or chin control due to being a C4 quadriplegic- he has NO significant movement of arms or legs- and  therefore, HAS to be able to mobilize to do pressure relief, esp because of Unstageable/stage III pressure ulcer as well as be able to move from place to place, these are the only options-   I also spoke to [facility] today and they extended pt to 10/29 to get home.   I spent a total of 30 minutes on care today as detailed above.    LOS: 27 days A FACE TO FACE EVALUATION WAS PERFORMED  Yenny Kosa 07/20/2020, 9:20 AM

## 2020-07-20 NOTE — Progress Notes (Signed)
Patient ID: Ryan Day, male   DOB: [birth year], 69 y.o.   MRN: [id number]  Sw left message for [facility]/RN liasion with [employer] [phone]) to set up outpatient palliative care referral. SW informed on pt s/c today. SW waiting on follow-up.  *SW spoke with Velta Addison to provide updates on challenges with obtaining HH, and waiting on updates from private duty RN program. SW also informed this SW will continue to follow case until there are updates.   SW waiting on updates from [facility] [phone]) to see if there were any updates from insurance.   SW received updates from [facility] about pt enteral feeds and if pt will receive his delivery before d/c this morning. States if he does not, he will receive delivery to home today.   SW received updates from [name] reporting that insurance declined private duty nursing due to him not having a respiratory requirement; and [facility] unable to accept referral.   SW met with pt and pt dtr Tonya in room to provide above updates. SW discussed applying for Medicaid and SSDI and explained processes.   SW left message with with Christy/RN with [facility] ([phone]) to inform on above updates with regard to private duty nursing, and requested insight on what else can be done to help.  *SW spoke with Alyse Low who reported private duty RN care is a custodial care need and he does not qualify, however qualifies for [facility]. SW reiterated pt has no [facility] willing to accept for care due to staffing. She reports she will follow-up with Einar Pheasant at [facility] to get more insight.   SW faxed HHPT/OT/SLP/SN referral to [facility] Int[facility] with [facility] ([phone]/f:[phone]), and [facility]([phone]/V:[phone]). SW waiting on follow-up.  *[facility]- [facility]/reports declined due to staffing [place] reported unable to accept referral due to staffing.   Declined HHA: [facility]- unable to trach patient [facility]-insurance and care needs [facility]-not in network [facility]- due to staffing [facility]- due to insurance  [facility] [place]- Erica/reports declined due to staffing  [name], MSW, [place] Office: [phone] Cell: [phone] Fax: [phone]

## 2020-07-20 NOTE — Discharge Summary (Signed)
Physician Discharge Summary  Patient ID: Ryan Day MRN: [id number] DOB/AGE: 68-May-1952 68 y.o.  Admit date: 06/23/2020 Discharge date: 07/20/2020  Discharge Diagnoses:  Principal Problem:   Spinal cord injury, cervical region, sequela ([place]) Active Problems:   DNR (do not resuscitate) discussion   Pressure injury of skin   Spasticity   Status post tracheostomy ([place])   Salivary secretion   Palliative care by specialist   Discharged Condition: stable  Significant Diagnostic Studies: DG Chest 2 View  Result Date: 07/11/2020 CLINICAL DATA:  Congestion. EXAM: CHEST - 2 VIEW COMPARISON:  07/09/2020.  06/19/2020. FINDINGS: Tracheostomy tube in stable position. Mediastinum hilar structures stable. Stable cardiomegaly. No pulmonary venous congestion. Low lung volumes with mild bibasilar atelectasis/infiltrates again noted. Stable left base pleuroparenchymal thickening consistent with scarring. Degenerative change thoracic spine. IMPRESSION: 1. Tracheostomy tube in stable position. 2. Stable cardiomegaly. 3. Low lung volumes with mild bibasilar atelectasis/infiltrates again noted. Electronically Signed   By: Marcello Moores  Register   On: 07/11/2020 16:18   DG CHEST PORT 1 VIEW  Result Date: 07/09/2020 CLINICAL DATA:  Cough. EXAM: PORTABLE CHEST 1 VIEW COMPARISON:  06/19/2020 chest radiograph and prior. FINDINGS: Indwelling tracheostomy tube overlies the midline air column. Medial right basilar patchy opacities. No pneumothorax or pleural effusion. Mild cardiomegaly. Osseous structures are unchanged. IMPRESSION: Patchy medial right basilar opacities. Differential includes infection, aspiration and atelectasis. Electronically Signed   By: Primitivo Gauze M.D.   On: 07/09/2020 09:54   DG Abd Portable 1V  Result Date: 06/28/2020 CLINICAL DATA:  Follow-up examination EXAM: PORTABLE ABDOMEN - 1 VIEW COMPARISON:  None. FINDINGS: Hyperdense material is seen within the distal colon and rectal vault,  likely representing material administered during modified barium swallow examination performed 06/27/2020. Normal abdominal gas pattern. No gross free intraperitoneal gas. Gastrostomy catheter overlies the left upper quadrant of the abdomen. IMPRESSION: Normal abdominal gas pattern. Previously administered oral contrast is now seen within the distal colon and rectum. Electronically Signed   By: Fidela Salisbury MD   On: 06/28/2020 19:27   DG Swallowing Func-Speech Pathology  Result Date: 06/27/2020 Objective Swallowing Evaluation: Type of Study: MBS-Modified Barium Swallow Study  Patient Details Name: Ryan Day MRN: [id number] Date of Birth: 12/29/1950 Today's Date: 06/27/2020 Past Medical History: Past Medical History: Diagnosis Date . Acute on chronic respiratory failure with hypoxia ([place])  . Acute renal injury due to hypovolemia ([place])  . Autonomic instability  . Cardiac arrest ([place])  . Cervical spinal cord injury, sequela ([place])  . Elevated alkaline phosphatase level  . 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 Hyperlipidemia  . Hypertension  Past Surgical History: Past Surgical History: Procedure Laterality Date . COLONOSCOPY WITH PROPOFOL N/A 12/05/2017  Procedure: COLONOSCOPY WITH PROPOFOL;  Surgeon: Lin Landsman, MD;  Location: [facility] ENDOSCOPY;  Service: Gastroenterology;  Laterality: N/A; . ESOPHAGOGASTRODUODENOSCOPY  12/05/2017  Procedure: ESOPHAGOGASTRODUODENOSCOPY (EGD);  Surgeon: Lin Landsman, MD;  Location: [facility] ENDOSCOPY;  Service: Gastroenterology;; HPI: Ryan Day is a 69 year old male with history of HTN otherwise in relatively good health who was admitted to [facility] on 04/26/2020 after fall, striking his head and face with subsequent qualdraplegia. He was found to have C5 vertebral body extension type teardrop fracture, C3-C6 cord compression with edema and was taken to OR for C3-C6 laminectomy with fusion and repair of small dural tear X 2 by Dr. Macario Carls on the same day. Post op  course significant for spinal shock with hypotension and bradycardia, post procedural PTX,  C-diff colitis,  significant secretions requiring multiple bronchoscopies due to mucous plugging and ultimately required PEG/Trach on 05/04/20. Hospital course further complicated by respiratory distress with PEA and and ROSC achieved after 8 minutes. Aspiration PNA and intermittent fevers treated, AKI/acute on chronic anemia/abnormal LFTs resolving and he was tolerating PMSV trials.  He was discharged to [facility] on 06/07/20 for management of acute on chronic respiratory failure and for therapy. He has had intermittent issues with pre-renal azotemia and hypernatremia--Na-149 today and started on IV dextrose. Secretions have improved with addition of scopolamine patch, he was downsized to CFS#6  and he is tolerating button plugging. He was found to have E-coli UTI at admission and treated with 7 day course of IV rocephin. CXR done 09/17 due to ongoing secretions and showed right infrahilar PNA which was treated with course of doxycycline. He was made NPO as FEES revealed penetration of nectar to vallecula with difficulty clearing residue and eventual aspiration. Multiple wound on sacrum have evolved and being managed with local measures.  Per RT- no desaturations or mucous plugging during his stay but pulmonary recommends evaluation on outpatient basis prior to decannulation due to weakness.  Therapy has been ongoing with patient showing improvement in activity tolerance and therapy team was recommending intensive rehab program.  He has a supportive family who plan on providing assistance after discharge. He was felt to be a good CIR candidate.  Assessment / Plan / Recommendation CHL IP CLINICAL IMPRESSIONS 06/27/2020 Clinical Impression -- Pt presents with severe oropharyngeal dysphagia characterized by silent aspiration and severe pharyngeal residue/stasis. Today's study may have been impacted by pt's cognitive function and level of  arousal; he was initially engaged and responsive to SLP and rad techs' questions, however right before first PO trial, pt demonstrated a fixed left gaze and eventually closed his eyes. He required Max A multimodal stimulation to achieve focused attention on SLP and respond "yes" to questions about readiness for POs. He stayed awake throughout subsequent trials, but remained lethargic and minimally verbal with therapist. Pt exhibited very weak and prolonged lingual manipulation, lingual pumping, and reduced AP transit of puree boluses, in addition to reduced base of tongue retraction. Swallow initiation mostly occurred at the level of the pyriform sinuses; severe residue of puree remained in both vallecular and pyriform sinuses. He intermittently followed verbal command to perform extra dry swallow X1, however it was not effective to clear considerable residue. Nectar and thin barium were both silently aspirated (PAS score 8) either prior to or during the swallow. Suspect puree residue in pyriforms was also aspirated. Given results of today's study, SLP would recommend pt continue NPO with alternative means of nutrition and medication administration due to severe aspiration risk as well as risk factors that would increase his potential to develop aspiration pneumonia at this time. Pt would likely benefit from exercises for pharyngeal and respiratory muscle strengthening/voice and breath support interventions. Would also still recommend conservative PO trials after oral care with SLP only to assess readiness for repeat MBSS. SLP Visit Diagnosis Dysphagia, oropharyngeal phase (R13.12) Attention and concentration deficit following -- Frontal lobe and executive function deficit following -- Impact on safety and function Severe aspiration risk;Risk for inadequate nutrition/hydration     CHL IP DIET RECOMMENDATION 06/27/2020 SLP Diet Recommendations NPO Liquid Administration via -- Medication Administration Via alternative  means Compensations -- Postural Changes --   CHL IP OTHER RECOMMENDATIONS 06/27/2020 Recommended Consults -- Oral Care Recommendations Oral care QID Other Recommendations Remove water pitcher;Have oral suction available  No flowsheet data found.  No flowsheet data found.     CHL IP ORAL PHASE 06/27/2020 Oral Phase Impaired Oral - Pudding Teaspoon -- Oral - Pudding Cup -- Oral - Honey Teaspoon -- Oral - Honey Cup -- Oral - Nectar Teaspoon -- Oral - Nectar Cup -- Oral - Nectar Straw Delayed oral transit;Lingual pumping;Weak lingual manipulation Oral - Thin Teaspoon -- Oral - Thin Cup -- Oral - Thin Straw Weak lingual manipulation;Decreased bolus cohesion Oral - Puree Decreased bolus cohesion;Weak lingual manipulation Oral - Mech Soft -- Oral - Regular -- Oral - Multi-Consistency -- Oral - Pill -- Oral Phase - Comment --  CHL IP PHARYNGEAL PHASE 06/27/2020 Pharyngeal Phase Impaired Pharyngeal- Pudding Teaspoon -- Pharyngeal -- Pharyngeal- Pudding Cup -- Pharyngeal -- Pharyngeal- Honey Teaspoon -- Pharyngeal -- Pharyngeal- Honey Cup -- Pharyngeal -- Pharyngeal- Nectar Teaspoon -- Pharyngeal -- Pharyngeal- Nectar Cup -- Pharyngeal -- Pharyngeal- Nectar Straw Delayed swallow initiation-vallecula;Reduced airway/laryngeal closure;Reduced tongue base retraction;Penetration/Aspiration during swallow;Moderate aspiration;Pharyngeal residue - valleculae;Pharyngeal residue - pyriform;Pharyngeal residue - posterior pharnyx;Compensatory strategies attempted (with notebox) Pharyngeal Material enters airway, passes BELOW cords without attempt by patient to eject out (silent aspiration) Pharyngeal- Thin Teaspoon -- Pharyngeal -- Pharyngeal- Thin Cup -- Pharyngeal -- Pharyngeal- Thin Straw Delayed swallow initiation-pyriform sinuses;Reduced pharyngeal peristalsis;Penetration/Aspiration before swallow;Reduced airway/laryngeal closure;Pharyngeal residue - valleculae;Pharyngeal residue - pyriform;Pharyngeal residue - posterior  pharnyx;Reduced tongue base retraction Pharyngeal Material enters airway, passes BELOW cords without attempt by patient to eject out (silent aspiration) Pharyngeal- Puree Reduced tongue base retraction;Penetration/Apiration after swallow;Delayed swallow initiation-pyriform sinuses;Reduced pharyngeal peristalsis;Pharyngeal residue - valleculae;Pharyngeal residue - pyriform;Pharyngeal residue - posterior pharnyx;Compensatory strategies attempted (with notebox) Pharyngeal Material enters airway, passes BELOW cords without attempt by patient to eject out (silent aspiration) Pharyngeal- Mechanical Soft -- Pharyngeal -- Pharyngeal- Regular -- Pharyngeal -- Pharyngeal- Multi-consistency -- Pharyngeal -- Pharyngeal- Pill -- Pharyngeal -- Pharyngeal Comment --  CHL IP CERVICAL ESOPHAGEAL PHASE 06/27/2020 Cervical Esophageal Phase WFL Pudding Teaspoon -- Pudding Cup -- Honey Teaspoon -- Honey Cup -- Nectar Teaspoon -- Nectar Cup -- Nectar Straw -- Thin Teaspoon -- Thin Cup -- Thin Straw -- Puree -- Mechanical Soft -- Regular -- Multi-consistency -- Pill -- Cervical Esophageal Comment -- Arbutus Leas 06/27/2020, 12:36 PM              VAS [place] LOWER EXTREMITY VENOUS (DVT)  Result Date: 06/25/2020  Lower Venous DVTStudy Indications: Immobility- quadriplegia.  Comparison Study: No prior studies. Performing Technologist: Darlin Coco  Examination Guidelines: A complete evaluation includes B-mode imaging, spectral Doppler, color Doppler, and power Doppler as needed of all accessible portions of each vessel. Bilateral testing is considered an integral part of a complete examination. Limited examinations for reoccurring indications may be performed as noted. The reflux portion of the exam is performed with the patient in reverse Trendelenburg.  +---------+---------------+---------+-----------+----------+--------------+ RIGHT    CompressibilityPhasicitySpontaneityPropertiesThrombus Aging  +---------+---------------+---------+-----------+----------+--------------+ CFV      Full           Yes      Yes                                 +---------+---------------+---------+-----------+----------+--------------+ SFJ      Full                                                        +---------+---------------+---------+-----------+----------+--------------+  FV Prox  Full                                                        +---------+---------------+---------+-----------+----------+--------------+ FV Mid   Full                                                        +---------+---------------+---------+-----------+----------+--------------+ FV DistalFull                                                        +---------+---------------+---------+-----------+----------+--------------+ PFV      Full                                                        +---------+---------------+---------+-----------+----------+--------------+ POP      Full           Yes      Yes                                 +---------+---------------+---------+-----------+----------+--------------+ PTV      Full                                                        +---------+---------------+---------+-----------+----------+--------------+ PERO     Full                                                        +---------+---------------+---------+-----------+----------+--------------+   +---------+---------------+---------+-----------+----------+--------------+ LEFT     CompressibilityPhasicitySpontaneityPropertiesThrombus Aging +---------+---------------+---------+-----------+----------+--------------+ CFV      Full           Yes      Yes                                 +---------+---------------+---------+-----------+----------+--------------+ SFJ      Full                                                         +---------+---------------+---------+-----------+----------+--------------+ FV Prox  Full                                                        +---------+---------------+---------+-----------+----------+--------------+  FV Mid   Full                                                        +---------+---------------+---------+-----------+----------+--------------+ FV DistalFull                                                        +---------+---------------+---------+-----------+----------+--------------+ PFV      Full           Yes      Yes                                 +---------+---------------+---------+-----------+----------+--------------+ POP      Full           Yes      Yes                                 +---------+---------------+---------+-----------+----------+--------------+ PTV      Full                                                        +---------+---------------+---------+-----------+----------+--------------+ PERO     Full                                                        +---------+---------------+---------+-----------+----------+--------------+     Summary: RIGHT: - There is no evidence of deep vein thrombosis in the lower extremity.  - No cystic structure found in the popliteal fossa.  LEFT: - There is no evidence of deep vein thrombosis in the lower extremity.  - No cystic structure found in the popliteal fossa.  *See table(s) above for measurements and observations. Electronically signed by Harold Barban MD on 06/25/2020 at 7:42:18 PM.    Final     Labs:  Basic Metabolic Panel: Recent Labs  Lab 07/14/20 0455 07/17/20 0810 07/18/20 0943 07/19/20 0602  NA 139 144 142 139  K 4.1 3.7 3.5 4.3  CL 102 106 106 102  CO2 _0 GLUCOSE 122* 100* 186* 84  BUN 26* 25* 25* 26*  CREATININE 0.76 0.65 0.65 0.62  CALCIUM 9.0 9.5 9.4 9.1    CBC: Recent Labs  Lab 07/17/20 0810 07/19/20 1600  WBC 7.7 8.6  NEUTROABS  --   5.9  HGB 8.1* 8.1*  HCT 25.8* 26.3*  MCV 90.5 89.5  PLT 348 387    CBG: Recent Labs  Lab 07/18/20 0826 07/18/20 2009 07/19/20 0758 07/19/20 2039 07/20/20 0757  GLUCAP 135* 133* 88 162* 84    Brief HPI:   Fabrice Dyal is a 69 y.o. male with history of hypertension otherwise in good health who was admitted to [facility] on 04/26/2020 after a fall  where he struck his head and face with subsequent quadriplegia.  He was found to have C5 vertebral body with extension type teardrop fracture, C3-C6 cord compression with edema and was taken to the OR for C3-6 C6 laminectomy with fusion and repair of small dural tear x2 by Dr. Algis Downs on the same day.  Postop course was significant for spinal shock with hypotension and bradycardia, postprocedure PTX, C. difficile colitis, issues with significant secretions requiring multiple bronchoscopies and ultimately requiring trach and PEG placement on 05/04/2020.  Hospital course further complicated by respiratory distress with PEA and ROSC achieved after 8 minutes.  Aspiration pneumonia as well as intermittently was treated.  AKI with acute on chronic anemia and abnormal LFTs were resolving.  He was tolerating PSV trials and was discharged to [facility] on 06/07/2020 for management and therapy.  He continued to have issues with prerenal azotemia as well as hyponatremia and was started on IV dextrose for hydration.  Secretions were improved with addition of scopolamine patch.  We tolerate downsizing to a cuffless [place] #6 and was tolerating very pleasant.  He was treated for E. coli UTI as well as a PNA.  He was made n.p.o. as a FEES revealed aspiration risk.  Multiple wounds on sacrum were being managed with local measures.  Therapy was ongoing and patient was showing improvement in activity tolerance.  Therapy team recommended intensive   Hospital Course: Shiraz Bastyr was admitted to rehab 06/23/2020 for inpatient therapies to consist of PT, ST and OT at least three hours five  days a week. Past admission physiatrist, therapy team and rehab RN have worked together to provide customized collaborative inpatient rehab.  BLE Dopplers done past admission have been negative.  He'll continues on subcu Lovenox for DVT prophylaxis and requires 2 additional weeks to complete 3 months of anticoagulation therapy.  He has had issues with hypotension due to autonomic dysfunction and Florinef, midodrine, Ace wrap, abdominal binder and teds were added with improvement in symptoms.  Acute on chronic anemia has been monitored with serial checks, iron was added for supplementation and serial CBC shows H&H is stable.  He did develop hyponatremia felt to be in setting of dehydration.  Dr. Joelyn Oms was consulted for input recommended increasing water flushes to 400 cc every 2 hours with target correction around 145.  Water flushes were decreased to every 4 hours as sodium levels have improved and stabilized.  Recently has been managed with addition of baclofen 3 times daily.  Patient was noted to have pressure injuries on bilateral buttocks at admission.  Air mattress overlay as well as frequent positioning for pressure relief measures have been ongoing.  Work was consulted for input additionally and Santyl with damp to dry dressing changes have been ongoing.  Protein supplements were added to help promote wound healing in addition to vitamins as well as adjustment of tube feeds to help address appropriate nutritional needs.  Wound has been healing in nicely with decrease in slough.  PEG site is clean dry and intact.  Trach remains capped and respiratory status is stable.  He has been instructed on pulmonary hygiene with use of flutter valve and family has been educated on importance of performing quad cough at least 4 times daily to help mobilize secretions.  MBS done to evaluate swallow and NPO recommended due to severe aspiration risk. Ice chips after oral care trials initiated and he is tolerating this.  Follow-up chest x-ray done on 10/20 was stable and Scopolamine patch was  added to help decrease secretions.  He was started on scheduled bowel program and FiberCon and probiotics added to help with bulking in addition to senna in a.m.  Palliative care was consulted to help determine goals of care and family elected on full scope of treatment.  Community-based palliative care to follow patient post discharge. Neuropsychologist was consulted assist patient and wife on coping and adjustment issues. Foley was changed out at admission and then again on 10/27 prior to discharge.  UA done 10/27 and was positive for leukocytes but negative for nitrites.  CBC showed normal WBC without left shift and positive UA felt to be due to colonization not due to UTI.  Discussion regarding care at home as well as SNF discussed with patient and family who elected on discharge to home with multiple family members assisting with care. Extensive family education hs been completed. LCSW has been working extensively on discharge needs and has reached out to multiple [facility] agencies who have decline patient due to complexity of care and staffing needs. Family advised to reach out to doctors making house calls. follow up appointment set with wound care, ENT and pulmonary care. He was discharged to home on 07/20/20.   Rehab course: During patient's stay in rehab weekly team conferences were held to monitor patient's progress, set goals and discuss barriers to discharge. At admission, patient required total assist with mobility and self care tasks.  He exhibited moderate dysphonia and needed mod cues for speech intelligibility at sentence levels and maintained NPO. He  has had improvement in activity tolerance and increased ability to advocate for himself and instruct family on care needed.  He requires total assist +2 with ADL tasksHe has met power wheelchair goals for safe discharge. He continues to require total assist for sitting balance  and hoyer lift for transfers. He has improved his speech and voice function and is able to communicate at phrase sentences and conversational level.  He is tolerating ice chips after oral care with minimal overt S/S of aspiration. Extensive family education family education completed.    Disposition: Home  Diet:: NPO  Special Instructions: 1. Perform oral care qid. Ice chips past oral care. 2. Santyl with damp to dry dressing to yelllow eschar on sacrum. Change daily and prn if soiled.  3. Boost every 20 minutes when in chair. Turn every 2-3 hours when in bed.  4. Pulmonary toilet every 4 hours while awake. Quad cough at least qid and prn.  5. Recommend repeat BMET in 1-2 weeks. Continue 400 cc water flushes every 4 hours.  6.   Discharge Instructions    AMB referral to wound care center   Complete by: As directed    Needs appointment in 7-10 days to follow up sacral decub. Patient is a quad   Ambulatory referral to Physical Medicine Rehab   Complete by: As directed    1-2 weeks TC appt--may need to be tele visit   Ambulatory referral to Speech Therapy   Complete by: As directed    If ordering provider is a resident, enter attending physician's name: Eval and treat     Allergies as of 07/20/2020   No Known Allergies     Medication List    STOP taking these medications   aspirin 81 MG chewable tablet   hydrochlorothiazide 25 MG tablet Commonly known as: HYDRODIURIL   simvastatin 40 MG tablet Commonly known as: ZOCOR     TAKE these medications   acetaminophen 160 MG/5ML solution  Commonly known as: TYLENOL Place 10-20 mLs (320-640 mg total) into feeding tube every 4 (four) hours as needed for mild pain. Notes to patient: **NEW** For mild pain or fever. Do not take more than 3051m per day.   ascorbic acid 500 MG tablet Commonly known as: VITAMIN C Place 1 tablet (500 mg total) into feeding tube 2 (two) times daily. Notes to patient: Vitamin C supplement. Purchase over  the counter--for wound healing   Baclofen 5 MG Tabs Place 5 mg into feeding tube 3 (three) times daily. Notes to patient: For spasms. May cause drowsiness and dizziness.    bisacodyl 10 MG suppository Commonly known as: DULCOLAX Place 1 suppository (10 mg total) rectally at bedtime. Notes to patient: To prevent constipation. Hold if loose stools   chlorhexidine 0.12 % solution Commonly known as: PERIDEX 15 mLs by Mouth Rinse route 2 (two) times daily. Notes to patient: For oral hygiene   collagenase ointment Commonly known as: SANTYL Apply topically daily. Notes to patient: Apply to yellow tissues on ulcer. Then cover with damp to dry dressing. Change daily and more frequently if soiled   enoxaparin 40 MG/0.4ML injection Commonly known as: LOVENOX Inject 0.4 mLs (40 mg total) into the skin daily. Notes to patient: Blood thinner--will need to be on this for 2 more weeks.   famotidine 20 MG tablet Commonly known as: PEPCID Place 1 tablet (20 mg total) into feeding tube daily. Notes to patient: For reflux/indigestion   feeding supplement (OSMOLITE 1.5 CAL) Liqd Place 474 mLs into feeding tube 4 (four) times daily. Notes to patient: For nutrition   nutrition supplement (JUVEN) Pack Place 1 packet into feeding tube 2 (two) times daily between meals. Notes to patient: For nutrition   feeding supplement (PROSource TF) liquid Place 45 mLs into feeding tube 2 (two) times daily. Notes to patient: For nutrition   ferrous sulfate 300 (60 Fe) MG/5ML syrup Place 1.3 mLs (78 mg total) into feeding tube daily. Notes to patient: Iron supplement For anemia   fludrocortisone 0.1 MG tablet Commonly known as: FLORINEF Place 2 tablets (0.2 mg total) into feeding tube daily. Notes to patient: For blood pressure support   FLUoxetine 20 MG/5ML solution Commonly known as: PROZAC Place 10 mLs (40 mg total) into feeding tube daily. Notes to patient: For depression   free water  Soln Place 400 mLs into feeding tube every 4 (four) hours. Notes to patient: For hydration--Can use filtered water   guaiFENesin 200 MG tablet Place 2 tablets (400 mg total) into feeding tube every 6 (six) hours. Notes to patient: Purchase over the counter--To keep secretions thinned out.    lidocaine 5 % Commonly known as: LIDODERM Place 1 patch onto the skin daily. Remove & Discard patch within 12 hours or as directed by MD Notes to patient: For pain. Purchase over the counter   midodrine 10 MG tablet Commonly known as: PROAMATINE Place 1 tablet (10 mg total) into feeding tube 3 (three) times daily with meals. Notes to patient: For blood pressure support   mouth rinse Liqd solution 15 mLs by Mouth Rinse route 2 times daily at 12 noon and 4 pm. Notes to patient: Use mouthwash for oral care 2-4 times a day.    multivitamin Liqd Place 5 mLs into feeding tube daily. Notes to patient: Supplement   ondansetron 4 MG tablet Commonly known as: ZOFRAN Place 1 tablet (4 mg total) into feeding tube every 8 (eight) hours as needed for nausea, vomiting or  refractory nausea / vomiting. Notes to patient: For nausea    polycarbophil 625 MG tablet Commonly known as: FIBERCON Place 1 tablet (625 mg total) into feeding tube daily. Notes to patient: To bulk stools   saccharomyces boulardii 250 MG capsule Commonly known as: FLORASTOR Place 1 capsule (250 mg total) into feeding tube 2 (two) times daily. Notes to patient: Probiotic supplement   scopolamine 1 MG/3DAYS Commonly known as: TRANSDERM-SCOP Place 1 patch (1.5 mg total) onto the skin every 3 (three) days. Notes to patient: To prevent nausea. Changed on 10/27--needs to be changed every  72 hours--next change on Saturday am 10/30   sennosides 8.8 MG/5ML syrup Commonly known as: SENOKOT Place 5 mLs into feeding tube daily at 6 (six) AM.   simethicone 40 MG/0.6ML drops Commonly known as: MYLICON Place 1.2 mLs (80 mg total) into  feeding tube 4 (four) times daily. Notes to patient: For gas --purchase over the counter   traZODone 50 MG tablet Commonly known as: DESYREL Place 1 tablet (50 mg total) into feeding tube at bedtime. Notes to patient: For sleep   zinc sulfate 220 (50 Zn) MG capsule Place 1 capsule (220 mg total) into feeding tube daily. Notes to patient: For wound healing--may need to purchase over the counter.        Follow-up Information    Lovorn, Jinny Blossom, MD Follow up.   Specialty: Physical Medicine and Rehabilitation Why: Office will call with follow up appointment Contact information: [address] [place] [address] [phone]        [place] Follow up on 10/04/2020.   Specialty: Wound Care Why: be there at 12:30 pm for follow up Contact information: [address] [id number] [place]              Follow up on 08/16/2020.   Why: Be there at 1pm for 1:15 appointment Contact information: [place]. [place] [phone]       Clyde Canterbury, MD Follow up on 07/31/2020.   Specialty: Otolaryngology Why: Be there at 1:15 for 1:30 appointment. Take your trach supplies and portable suction with you.  Contact information: [address] [place] [address] [phone]        Dallie Piles, MD. Call.   Specialty: Orthopedic Surgery Why: for post op appointment Contact information: [place] [address] [phone]        Allyne Gee, MD Follow up on 08/03/2020.   Specialties: Internal Medicine, Pulmonary Disease Why: Be there at 1130 am for 11:45 appointment Contact information: [address] [place]  [address] [phone]               Signed: Bary Leriche 07/20/2020, 9:56 AM

## 2020-07-20 NOTE — Progress Notes (Signed)
This chaplain is present providing spiritual care as Pt. prepares for D/C to home.

## 2020-07-21 ENCOUNTER — Telehealth: Payer: Self-pay | Admitting: Physical Medicine and Rehabilitation

## 2020-07-21 ENCOUNTER — Telehealth: Payer: Self-pay | Admitting: Family Medicine

## 2020-07-21 NOTE — Telephone Encounter (Signed)
Denise from [employer] is calling to see if PCP is ok with them for palliative care for the Pt/ Pt was recommended for palliative care by Gershon Mussel [place]/ please advise

## 2020-07-21 NOTE — Telephone Encounter (Signed)
Yes.  Thank you.

## 2020-07-21 NOTE — Telephone Encounter (Signed)
Called and LVM with [facility] as instructed letting them know that Dr. Wynetta Emery said that this was ok.

## 2020-07-21 NOTE — Telephone Encounter (Signed)
Discussed UCS results with wife and daughter. They were tired--did turn patient every 2-3 hours. He was doing well. Advised that culture results due to colonization --to monitor for fevers,chills, malaise, MS changes or N/V or changes in urine clarity. They reported mild odor--to monitor and contact us for any signs of infection. They are concerned about their f/u with ENT as ramp has not arrived yet. Recommended not changing appointment till day before if needed.

## 2020-07-22 LAB — URINE CULTURE: Culture: >=100000 — AB

## 2020-07-23 ENCOUNTER — Emergency Department
Admission: EM | Admit: 2020-07-23 | Discharge: 2020-07-23 | Disposition: A | Discharge status: Home / Self Care | Payer: BC Managed Care – PPO | Attending: Emergency Medicine | Admitting: Emergency Medicine

## 2020-07-23 ENCOUNTER — Emergency Department: Payer: BC Managed Care – PPO

## 2020-07-23 ENCOUNTER — Other Ambulatory Visit: Payer: Self-pay

## 2020-07-23 DIAGNOSIS — Z20822 Contact with and (suspected) exposure to covid-19: Secondary | ICD-10-CM | POA: Diagnosis not present

## 2020-07-23 DIAGNOSIS — R0989 Other specified symptoms and signs involving the circulatory and respiratory systems: Secondary | ICD-10-CM | POA: Insufficient documentation

## 2020-07-23 DIAGNOSIS — I10 Essential (primary) hypertension: Secondary | ICD-10-CM | POA: Insufficient documentation

## 2020-07-23 DIAGNOSIS — Z79899 Other long term (current) drug therapy: Secondary | ICD-10-CM | POA: Diagnosis not present

## 2020-07-23 DIAGNOSIS — R079 Chest pain, unspecified: Secondary | ICD-10-CM | POA: Insufficient documentation

## 2020-07-23 DIAGNOSIS — R0602 Shortness of breath: Secondary | ICD-10-CM

## 2020-07-23 LAB — CBC
HCT: 25.4 % — ABNORMAL LOW (ref 39.0–52.0)
Hemoglobin: 8.2 g/dL — ABNORMAL LOW (ref 13.0–17.0)
MCH: 28.4 pg (ref 26.0–34.0)
MCHC: 32.3 g/dL (ref 30.0–36.0)
MCV: 87.9 fL (ref 80.0–100.0)
Platelets: 378 10*3/uL (ref 150–400)
RBC: 2.89 MIL/uL — ABNORMAL LOW (ref 4.22–5.81)
RDW: 16.3 % — ABNORMAL HIGH (ref 11.5–15.5)
WBC: 7.1 10*3/uL (ref 4.0–10.5)
nRBC: 0.4 % — ABNORMAL HIGH (ref 0.0–0.2)

## 2020-07-23 LAB — COMPREHENSIVE METABOLIC PANEL
ALT: 69 U/L — ABNORMAL HIGH (ref 0–44)
AST: 47 U/L — ABNORMAL HIGH (ref 15–41)
Albumin: 2 g/dL — ABNORMAL LOW (ref 3.5–5.0)
Alkaline Phosphatase: 164 U/L — ABNORMAL HIGH (ref 38–126)
Anion gap: 9 (ref 5–15)
BUN: 21 mg/dL (ref 8–23)
CO2: 28 mmol/L (ref 22–32)
Calcium: 9.3 mg/dL (ref 8.9–10.3)
Chloride: 100 mmol/L (ref 98–111)
Creatinine, Ser: 0.53 mg/dL — ABNORMAL LOW (ref 0.61–1.24)
GFR, Estimated: >60 mL/min (ref >60)
Glucose, Bld: 103 mg/dL — ABNORMAL HIGH (ref 70–99)
Potassium: 3.6 mmol/L (ref 3.5–5.1)
Sodium: 137 mmol/L (ref 135–145)
Total Bilirubin: 0.5 mg/dL (ref 0.3–1.2)
Total Protein: 7.4 g/dL (ref 6.5–8.1)

## 2020-07-23 LAB — BRAIN NATRIURETIC PEPTIDE: B Natriuretic Peptide: 123.4 pg/mL — ABNORMAL HIGH (ref 0.0–100.0)

## 2020-07-23 LAB — RESPIRATORY PANEL BY RT PCR (FLU A&B, COVID)
Influenza A by PCR: NEGATIVE
Influenza B by PCR: NEGATIVE
SARS Coronavirus 2 by RT PCR: NEGATIVE

## 2020-07-23 LAB — TROPONIN I (HIGH SENSITIVITY): Troponin I (High Sensitivity): 7 ng/L (ref <18)

## 2020-07-23 IMAGING — DX DG CHEST 1V PORT
1 series · 1 of 1 positions shown · non-contrast
Comparison: [DATE], [DATE]

CLINICAL DATA: Chest pain

EXAM:
PORTABLE CHEST 1 VIEW

[chest ap]
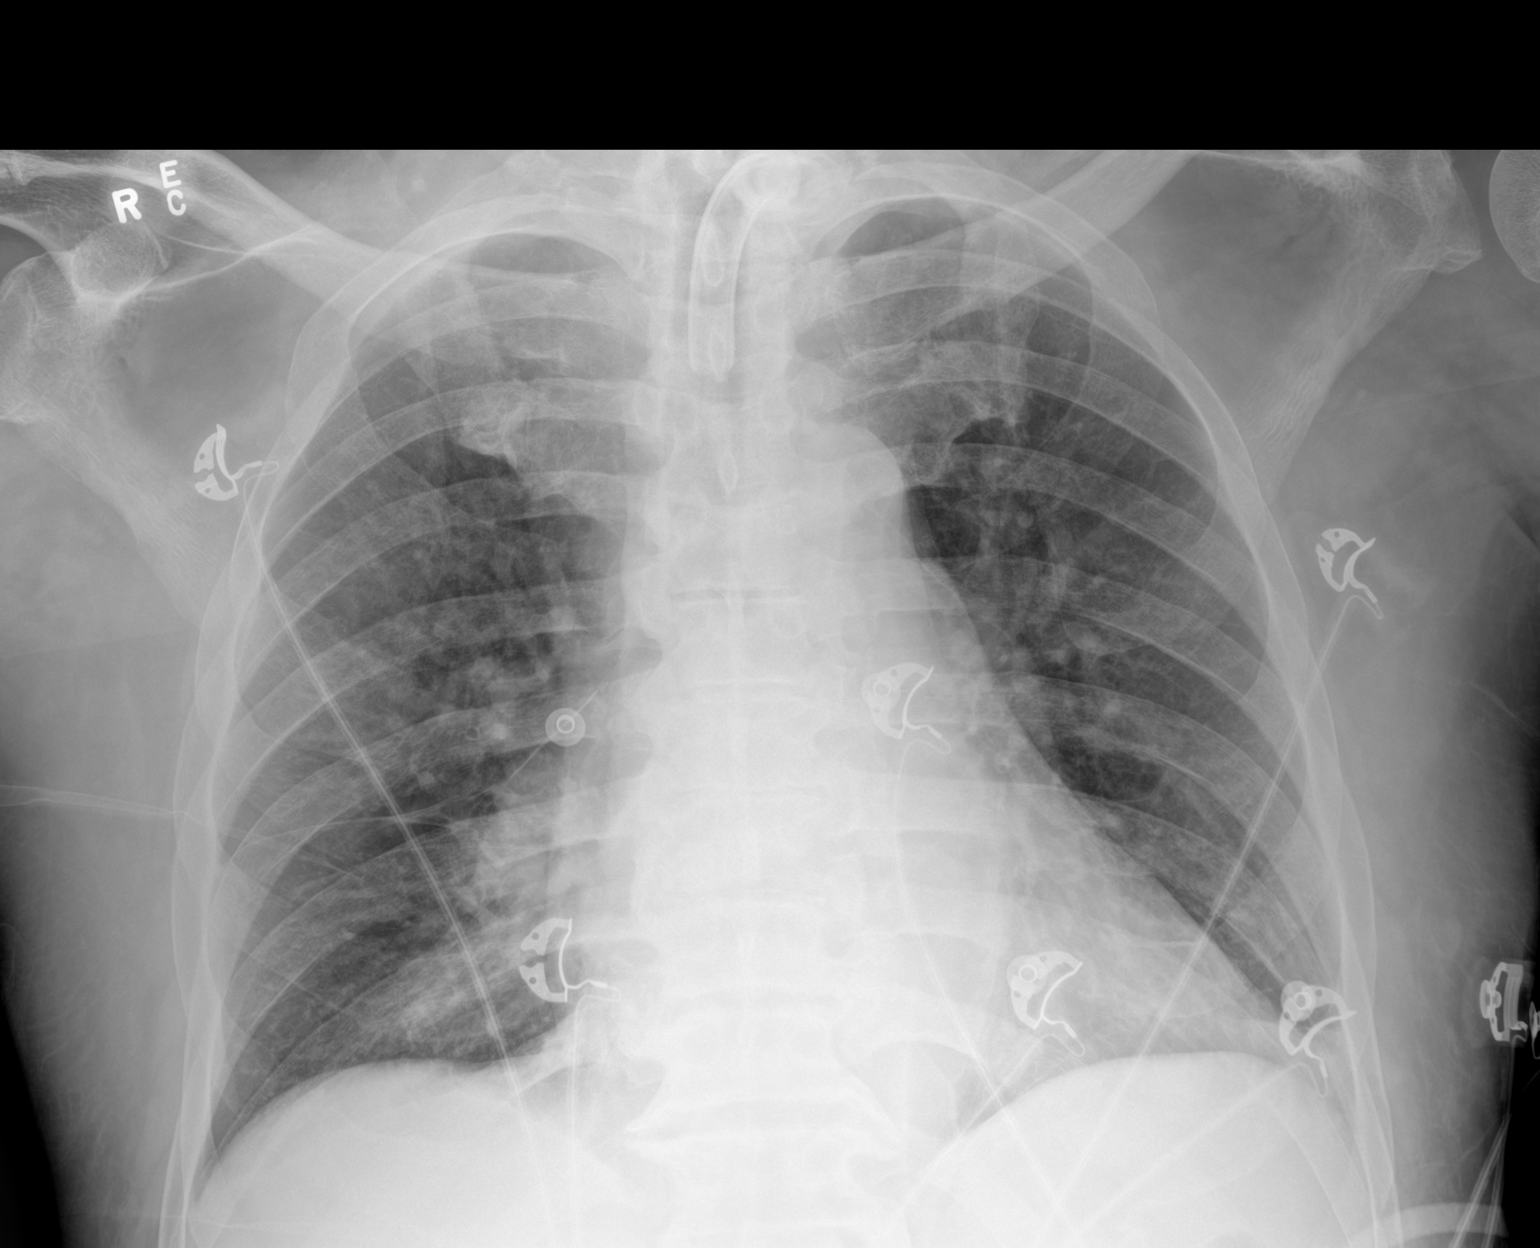

[1 of 1 positions shown; findings below may reference images not displayed]

FINDINGS: Tracheostomy unchanged in expected position. Right basilar
atelectasis or infiltrate is unchanged from multiple prior
examinations. The lungs are otherwise clear. No pneumothorax or
pleural effusion. Cardiac size within normal limits. Pulmonary
vascularity is normal. No acute bone abnormality.
IMPRESSION: Stable right basilar atelectasis or infiltrate.

## 2020-07-23 NOTE — ED Triage Notes (Signed)
Patient brought in by EMS with chronic trach, wife called EMS for pulse oximetry 88%. On EMS arrival, patient O2 saturation was 100%. Pt complaint of chest tightness x2 days. Per EMS pt oxygenation remaining 96-100% without oxygen support, needing suction periodically.

## 2020-07-23 NOTE — ED Notes (Signed)
EDP in room to try for [place] IV access.

## 2020-07-23 NOTE — ED Notes (Signed)
Family at bedside states patient has had increased secretions, reports pt's tube feedings have been increased and thinks that may have caused the increased in secretions.

## 2020-07-23 NOTE — ED Provider Notes (Signed)
[facility] Emergency Department Provider Note   ____________________________________________   First MD Initiated Contact with Patient 07/23/20 0259     (approximate)  I have reviewed the triage vital signs and the nursing notes.   HISTORY  Chief Complaint Chest Pain    HPI Ryan Day is a 69 y.o. male with a past medical history of cervical spinal cord injury, hypertension, and autonomic instability presents from home via EMS complaining of chest pain and shortness of breath.  Patient states that the symptoms have been worsening over the last 24 hours and were associated with a pulse ox reading at home of 88%.  When EMS arrived they found patient to be 100% on room air.  Patient has needed intermittent suctioning throughout transport due to patient's inability to fully clear secretions.  Patient states that he has had chest congestion with a productive cough for the last 2 months but has never been hypoxic like he was today and prompted a call to EMS.  Patient describes a bandlike anterior chest tightness that does not radiate, is 3/10 in severity, and has been present for the last 24 hours and has no exacerbating or relieving factors.         Past Medical History:  Diagnosis Date  . Acute on chronic respiratory failure with hypoxia ([place])   . Acute renal injury due to hypovolemia ([place])   . Autonomic instability   . Cardiac arrest ([place])   . Cervical spinal cord injury, sequela ([place])   . Elevated alkaline phosphatase level   . 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 Hyperlipidemia   . Hypertension     Patient Active Problem List   Diagnosis Date Noted  . Palliative care by specialist   . Salivary secretion   . Spasticity   . Status post tracheostomy ([place])   . Spinal cord injury, cervical region, sequela ([place]) 06/23/2020  . Pressure injury of skin 06/23/2020  . S/P percutaneous endoscopic gastrostomy (PEG) tube placement ([place])   .  Tracheostomy in place [facility])   . Quadriplegia ([place])   . Neurogenic orthostatic hypotension ([place])   . Acute on chronic anemia   . Hypernatremia   . Neurogenic bladder   . Neurogenic bowel   . Acute on chronic respiratory failure with hypoxia ([place])   . Autonomic instability   . Cardiac arrest ([place])   . Cervical spinal cord injury, sequela ([place])   . Acute renal injury due to hypovolemia ([place])   . Family history of stomach cancer   . DNR (do not resuscitate) discussion   . Exercise-induced leg cramps 05/13/2017  . Elevated alkaline phosphatase level 03/13/2015  . Hyperlipidemia   . Hypertension     Past Surgical History:  Procedure Laterality Date  . COLONOSCOPY WITH PROPOFOL N/A 12/05/2017   Procedure: COLONOSCOPY WITH PROPOFOL;  Surgeon: Lin Landsman, MD;  Location: [facility] ENDOSCOPY;  Service: Gastroenterology;  Laterality: N/A;  . ESOPHAGOGASTRODUODENOSCOPY  12/05/2017   Procedure: ESOPHAGOGASTRODUODENOSCOPY (EGD);  Surgeon: Lin Landsman, MD;  Location: [facility] ENDOSCOPY;  Service: Gastroenterology;;    Prior to Admission medications   Medication Sig Start Date End Date Taking? Authorizing Provider  acetaminophen (TYLENOL) 160 MG/5ML solution Place 10-20 mLs (320-640 mg total) into feeding tube every 4 (four) hours as needed for mild pain. 07/18/20   Love, Ivan Anchors, PA-C  ascorbic acid (VITAMIN C) 500 MG tablet Place 1 tablet (500 mg total) into feeding tube 2 (two) times daily. 07/18/20  Love, Ivan Anchors, PA-C  Baclofen 5 MG TABS Place 5 mg into feeding tube 3 (three) times daily. 07/20/20   Love, Ivan Anchors, PA-C  bisacodyl (DULCOLAX) 10 MG suppository Place 1 suppository (10 mg total) rectally at bedtime. 07/20/20   Love, Ivan Anchors, PA-C  chlorhexidine (PERIDEX) 0.12 % solution 15 mLs by Mouth Rinse route 2 (two) times daily. 07/20/20   Love, Ivan Anchors, PA-C  collagenase (SANTYL) ointment Apply topically daily. 07/20/20   Love, Ivan Anchors, PA-C  enoxaparin (LOVENOX) 40 MG/0.4ML  injection Inject 0.4 mLs (40 mg total) into the skin daily. 07/20/20   Love, Ivan Anchors, PA-C  famotidine (PEPCID) 20 MG tablet Place 1 tablet (20 mg total) into feeding tube daily. 07/20/20   Love, Ivan Anchors, PA-C  ferrous sulfate 300 (60 Fe) MG/5ML syrup Place 1.3 mLs (78 mg total) into feeding tube daily. 07/20/20   Love, Ivan Anchors, PA-C  fludrocortisone (FLORINEF) 0.1 MG tablet Place 2 tablets (0.2 mg total) into feeding tube daily. 07/20/20   Love, Ivan Anchors, PA-C  FLUoxetine (PROZAC) 20 MG/5ML solution Place 10 mLs (40 mg total) into feeding tube daily. 07/20/20   Love, Ivan Anchors, PA-C  guaiFENesin 200 MG tablet Place 2 tablets (400 mg total) into feeding tube every 6 (six) hours. 07/20/20   Love, Ivan Anchors, PA-C  lidocaine (LIDODERM) 5 % Place 1 patch onto the skin daily. Remove & Discard patch within 12 hours or as directed by MD 07/20/20   Love, Ivan Anchors, PA-C  midodrine (PROAMATINE) 10 MG tablet Place 1 tablet (10 mg total) into feeding tube 3 (three) times daily with meals. 07/20/20   Love, Ivan Anchors, PA-C  Mouthwashes (MOUTH RINSE) LIQD solution 15 mLs by Mouth Rinse route 2 times daily at 12 noon and 4 pm. 07/20/20   Love, Ivan Anchors, PA-C  Multiple Vitamin (MULTIVITAMIN) LIQD Place 5 mLs into feeding tube daily. 07/20/20   Love, Ivan Anchors, PA-C  nutrition supplement, JUVEN, (JUVEN) PACK Place 1 packet into feeding tube 2 (two) times daily between meals. 07/20/20   Love, Ivan Anchors, PA-C  Nutritional Supplements (FEEDING SUPPLEMENT, OSMOLITE 1.5 CAL,) LIQD Place 474 mLs into feeding tube 4 (four) times daily. 07/20/20   Love, Ivan Anchors, PA-C  Nutritional Supplements (FEEDING SUPPLEMENT, PROSOURCE TF,) liquid Place 45 mLs into feeding tube 2 (two) times daily. 07/20/20   Love, Ivan Anchors, PA-C  ondansetron (ZOFRAN) 4 MG tablet Place 1 tablet (4 mg total) into feeding tube every 8 (eight) hours as needed for nausea, vomiting or refractory nausea / vomiting. 07/20/20   Love, Ivan Anchors, PA-C  polycarbophil  (FIBERCON) 625 MG tablet Place 1 tablet (625 mg total) into feeding tube daily. 07/20/20   Love, Ivan Anchors, PA-C  saccharomyces boulardii (FLORASTOR) 250 MG capsule Place 1 capsule (250 mg total) into feeding tube 2 (two) times daily. 07/20/20   Love, Ivan Anchors, PA-C  scopolamine (TRANSDERM-SCOP) 1 MG/3DAYS Place 1 patch (1.5 mg total) onto the skin every 3 (three) days. 07/20/20   Love, Ivan Anchors, PA-C  sennosides (SENOKOT) 8.8 MG/5ML syrup Place 5 mLs into feeding tube daily at 6 (six) AM. 07/20/20   Love, Ivan Anchors, PA-C  simethicone (MYLICON) 40 EG/3.1DV drops Place 1.2 mLs (80 mg total) into feeding tube 4 (four) times daily. 07/20/20   Love, Ivan Anchors, PA-C  traZODone (DESYREL) 50 MG tablet Place 1 tablet (50 mg total) into feeding tube at bedtime. 07/20/20   Love, Ivan Anchors, PA-C  Water For Irrigation,  Sterile (FREE WATER) SOLN Place 400 mLs into feeding tube every 4 (four) hours. 07/20/20   Love, Ivan Anchors, PA-C  zinc sulfate 220 (50 Zn) MG capsule Place 1 capsule (220 mg total) into feeding tube daily. 07/20/20   Bary Leriche, PA-C    Allergies Patient has no known allergies.  Family History  Problem Relation Age of Onset  . Cancer Father        lung  . Cancer Brother        throat  . Heart disease Brother 48  . Liver cancer Brother   . Alcohol abuse Brother     Social History Social History   Tobacco Use  . Smoking status: Never Smoker  . Smokeless tobacco: Never Used  Substance Use Topics  . Alcohol use: Yes    Comment: 1 or less  . Drug use: No    Review of Systems Constitutional: No fever/chills Eyes: No visual changes. ENT: No sore throat. Cardiovascular: Endorses chest pain. Respiratory: Endorses shortness of breath. Gastrointestinal: No abdominal pain.  No nausea, no vomiting.  No diarrhea. Genitourinary: Negative for dysuria. Musculoskeletal: Negative for acute arthralgias Skin: Negative for rash. Neurological: Negative for headaches,  weakness/numbness/paresthesias in any extremity Psychiatric: Negative for suicidal ideation/homicidal ideation   ____________________________________________   PHYSICAL EXAM:  VITAL SIGNS: ED Triage Vitals [07/23/20 0300]  Enc Vitals Group     BP (!) 125/52     Pulse Rate 77     Resp (!) 24     Temp      Temp src      SpO2 97 %     Weight      Height      Head Circumference      Peak Flow      Pain Score      Pain Loc      Pain Edu?      Excl. in [place]?    Constitutional: Alert and oriented. Well appearing and in no acute distress. Eyes: Conjunctivae are normal. PERRL. Head: Atraumatic. Nose: No congestion/rhinnorhea. Mouth/Throat: Mucous membranes are moist. Neck: No stridor Cardiovascular: Grossly normal heart sounds.  Good peripheral circulation. Respiratory: Normal respiratory effort.  No retractions.  Rhonchi over bilateral lung fields Gastrointestinal: Soft and nontender. No distention. Musculoskeletal: No obvious deformities Neurologic:  Normal speech and language. No gross focal neurologic deficits are appreciated. Skin:  Skin is warm and dry. No rash noted. Psychiatric: Mood and affect are normal. Speech and behavior are normal.  ____________________________________________   LABS (all labs ordered are listed, but only abnormal results are displayed)  Labs Reviewed  CBC - Abnormal; Notable for the following components:      Result Value   RBC 2.89 (*)    Hemoglobin 8.2 (*)    HCT 25.4 (*)    RDW 16.3 (*)    nRBC 0.4 (*)    All other components within normal limits  COMPREHENSIVE METABOLIC PANEL - Abnormal; Notable for the following components:   Glucose, Bld 103 (*)    Creatinine, Ser 0.53 (*)    Albumin 2.0 (*)    AST 47 (*)    ALT 69 (*)    Alkaline Phosphatase 164 (*)    All other components within normal limits  BRAIN NATRIURETIC PEPTIDE - Abnormal; Notable for the following components:   B Natriuretic Peptide 123.4 (*)    All other  components within normal limits  RESPIRATORY PANEL BY RT PCR (FLU A&B, COVID)  TROPONIN I (HIGH  SENSITIVITY)  TROPONIN I (HIGH SENSITIVITY)   ____________________________________________  EKG  ED ECG REPORT I, Naaman Plummer, the attending physician, personally viewed and interpreted this ECG.  Date: 07/23/2020 EKG Time: 0305 Rate: 71 Rhythm: normal sinus rhythm QRS Axis: normal Intervals: normal ST/T Wave abnormalities: normal Narrative Interpretation: no evidence of acute ischemia  ____________________________________________  RADIOLOGY  ED MD interpretation: One-view portable x-ray of the chest shows no evidence of acute abnormalities including no pneumonia, pneumothorax, or widened mediastinum.  Patient does have a stable right basilar atelectasis that is unchanged from multiple previous chest x-rays  Official radiology report(s): DG Chest Port 1 View  Result Date: 07/23/2020 CLINICAL DATA:  Chest pain EXAM: PORTABLE CHEST 1 VIEW COMPARISON:  07/11/2020, 07/09/2020 FINDINGS: Tracheostomy unchanged in expected position. Right basilar atelectasis or infiltrate is unchanged from multiple prior examinations. The lungs are otherwise clear. No pneumothorax or pleural effusion. Cardiac size within normal limits. Pulmonary vascularity is normal. No acute bone abnormality. IMPRESSION: Stable right basilar atelectasis or infiltrate. Electronically Signed   By: Fidela Salisbury MD   On: 07/23/2020 03:21    ____________________________________________   PROCEDURES  Procedure(s) performed (including Critical Care):  .1-3 Lead EKG Interpretation Performed by: Naaman Plummer, MD Authorized by: Naaman Plummer, MD     Interpretation: normal     ECG rate:  77   ECG rate assessment: normal     Rhythm: sinus rhythm     Ectopy: none     Conduction: normal       ____________________________________________   INITIAL IMPRESSION / ASSESSMENT AND PLAN / ED COURSE  As part of my  medical decision making, I reviewed the following data within the [place] notes reviewed and incorporated, Labs reviewed, EKG interpreted, Old chart reviewed, Radiograph reviewed and Notes from prior ED visits reviewed and incorporated        The patient is suffering from shortness of breath, but the immediate cause is not apparent.  Potential causes considered include, but are not limited to, asthma or COPD, congestive heart failure, pulmonary embolism, pneumothorax, coronary syndrome, pneumonia, and pleural effusion.  Despite the evaluation including history, exam, and testing, the cause of the shortness of breath remains unclear. However, during the ED stay, patients condition improved, and at the time of discharge the shortness of breath is resolved, they are feeling well, and want to go home.  Patient will be discharged with strict return precautions and advice to follow up with primary MD within 24 hours for further evaluation.      ____________________________________________   FINAL CLINICAL IMPRESSION(S) / ED DIAGNOSES  Final diagnoses:  Shortness of breath  Chest pain, unspecified type  Chest congestion     ED Discharge Orders    None       Note:  This document was prepared using Dragon voice recognition software and may include unintentional dictation errors.   Naaman Plummer, MD 07/23/20 [phone]

## 2020-07-23 NOTE — ED Notes (Signed)
Family at bedside suctioning patient.

## 2020-07-23 NOTE — ED Notes (Signed)
Per. Dr. Cheri Fowler, no repeat troponin needed at this time.

## 2020-07-23 NOTE — ED Notes (Signed)
Attempt if insertion without success. MD to attempt US guided iv insertion.

## 2020-07-23 NOTE — ED Notes (Signed)
Lab called to draw labs, spoke with Tommi Rumps, states phlebotomist are starting their morning rounds so unsure when they will get to the ED.

## 2020-07-23 NOTE — ED Notes (Signed)
Pt suctioned prior to discharge.

## 2020-07-23 NOTE — ED Notes (Signed)
RT called to deep suction patient's trach

## 2020-07-23 NOTE — ED Notes (Signed)
DC instructions discussed with patient and daughter.  Daughter asking about if MD was going to prescribe anything.  Advised her that MD had thought of prescribing a medication to help with secretions, however, felt it would be best be addressed by PCP or if palliative care services set up with their practice since there can be drug-drug interactions. Daughter verbalized understanding.  Daughter also asked about extra supplies for changing foley catheter. Advised her that the foley can stay in for 30 days and advised her that home health services should manage that and that disconnecting the bag and attaching

## 2020-07-23 NOTE — ED Notes (Signed)
[facility] to transport home.

## 2020-07-23 NOTE — Progress Notes (Signed)
Patient from inpatient rehab. Has #6 cuffless shiley trach per family. Patient has c-collar in place. Daughter states c-collar is removed and head is stabilized to facilitate trach care. States has been done in the last 24 hours. BBS rhonci throughout.  Trach suctioned x2 from moderate secretions. Patient on room air. Trach cap replaced after suction. Room air sats noted at 97%

## 2020-07-23 NOTE — ED Notes (Signed)
Patient resting quietly at this time, family at bedside.

## 2020-07-24 ENCOUNTER — Telehealth: Payer: Self-pay

## 2020-07-24 ENCOUNTER — Telehealth: Payer: Self-pay | Admitting: Physical Medicine and Rehabilitation

## 2020-07-24 ENCOUNTER — Telehealth: Payer: Self-pay | Admitting: Registered Nurse

## 2020-07-24 ENCOUNTER — Encounter: Payer: Self-pay | Admitting: Physical Medicine and Rehabilitation

## 2020-07-24 NOTE — Telephone Encounter (Signed)
The patient has quadriplegia-G82.50. 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  He is trach dependent, has difficulty mobilizing secretions despite quad cough being performed by family at least qid. He and was seen in ED on 07/23/20 for dyspnea/SOB from mucous plug and inability to fully clear secretions after recent prolonged hospitalization-->04/26/20 at [facility]-->06/07/20 to [facility] and ultimately CIR stay with discharge to home on 07/20/20.  Family is going to start chest PT but he continues to have copious secretions requiring frequent suctioning. He does not have a caregiver that is available to provide adequate CPT for 30 minutes twice a day. He would benefit from HFCWO (High Frequency Chest Wall Oscillation) Vest therapy.

## 2020-07-24 NOTE — Telephone Encounter (Signed)
Transitional Care call Ryan Day answered the Transitional Questions.   Patient name: Ryan Day DOB: 1951-02-19 1. Are you/is patient experiencing any problems since coming home? The problem was address by Dr Dagoberto Ligas, earlier today: See Note.  a. Are there any questions regarding any aspect of care? No 2. Are there any questions regarding medications administration/dosing? No a. Are meds being taken as prescribed? Yes b. "Patient should review meds with caller to confirm" Medication List Reviewed. 3. Have there been any falls? No 4. Has Home Health been to the house and/or have they contacted you? Home Health Services Pending: Ryan Pacas LCSW working on the above.  a. If not, have you tried to contact them? NA b. Can we help you contact them? NA 5. Are bowels and bladder emptying properly? Yes a. Are there any unexpected incontinence issues? [name] states he has a bout of incontinence this morning.  b. If applicable, is patient following bowel/bladder programs? [name] reports she is following bowel program. 6. Any fevers, problems with breathing, unexpected pain? No   Are there any skin problems or new areas of breakdown? Mr. Every  Had pressure injuries at admission: Continue current medication regimen.  7. Has the patient/family member arranged specialty MD follow up (ie cardiology/neurology/renal/surgical/etc.)?  HFU appointments are scheduled except for Dr. Hetty Blend, she was instructed to call office to schedule an appointment. She verbalizes understanding.  a. Can we help arrange? No 8. Does the patient need any other services or support that we can help arrange? No 9. Are caregivers following through as expected in assisting the patient? Yes 10. Has the patient quit smoking, drinking alcohol, or using drugs as recommended? (                        )  Appointment date/time 07/31/2020: My Chart Video Visit: They are awaiting on Ramp from manufacturer she reports.  time 11:00  for 11:20 My Chart Video Visit, with [name] ANP-C. At  [place]

## 2020-07-24 NOTE — Telephone Encounter (Signed)
Have spoken with 1 of daughters- who is geriatric nurse.  Pt is having drops in sats intermittently, but esp when on L side.  Explained he has  a STABLE atalectasis  area in R lung which could drop his sats if he's on L side.  Suggest rotating from back to R side and avoid L side except for quick turns at this time.  Went over mucus plugs  and how fast they can occur.  Also that will try and get resp therapy and percussion vest to prevent mucus plugs.  Also to continue to deep suction and to quad cough pt in series of 3-4x.  If they feel unsafe to have him in home setting, to call 911 immediately and go back to hospital and explained this is common initially for SCI patients at home that are high level/with trach.   They felt comfortable with discussion and will call back if need be.

## 2020-07-24 NOTE — Telephone Encounter (Signed)
SW ordered trach humidification package with [employer] via parachute per attending/PA request due to pt recent ED visit in which he was found ot have large increased secretions and mucus plug. Request for item to be delivered to home today.   SW spoke with Thedore Mins Blank/[employer] about ordering chest PT for pt due to families increased concerns related to secretions. SW working on ordering this item for patient.   *SW updated [name] in [place] on pt respiratory changes, and he will discuss with his clinical team if this would allow patient to be appropriate for private duty nursing.

## 2020-07-24 NOTE — Telephone Encounter (Signed)
Patient wife called wanting to speak to Dr. Dagoberto Ligas about patient tract. She states its oozing.

## 2020-07-25 ENCOUNTER — Inpatient Hospital Stay
Admission: EM | Admit: 2020-07-25 | Discharge: 2020-08-08 | DRG: 177 | Disposition: A | Discharge status: Hospice | Payer: BC Managed Care – PPO | Attending: Student | Admitting: Student

## 2020-07-25 ENCOUNTER — Telehealth: Payer: Self-pay

## 2020-07-25 ENCOUNTER — Encounter: Payer: Self-pay | Admitting: Emergency Medicine

## 2020-07-25 ENCOUNTER — Other Ambulatory Visit: Payer: Self-pay

## 2020-07-25 ENCOUNTER — Emergency Department: Payer: BC Managed Care – PPO

## 2020-07-25 DIAGNOSIS — Z930 Tracheostomy status: Secondary | ICD-10-CM

## 2020-07-25 DIAGNOSIS — R7401 Elevation of levels of liver transaminase levels: Secondary | ICD-10-CM | POA: Diagnosis present

## 2020-07-25 DIAGNOSIS — E876 Hypokalemia: Secondary | ICD-10-CM | POA: Diagnosis not present

## 2020-07-25 DIAGNOSIS — Z811 Family history of alcohol abuse and dependence: Secondary | ICD-10-CM

## 2020-07-25 DIAGNOSIS — E782 Mixed hyperlipidemia: Secondary | ICD-10-CM | POA: Diagnosis not present

## 2020-07-25 DIAGNOSIS — R0600 Dyspnea, unspecified: Secondary | ICD-10-CM

## 2020-07-25 DIAGNOSIS — Z7189 Other specified counseling: Secondary | ICD-10-CM

## 2020-07-25 DIAGNOSIS — E861 Hypovolemia: Secondary | ICD-10-CM | POA: Diagnosis present

## 2020-07-25 DIAGNOSIS — E870 Hyperosmolality and hypernatremia: Secondary | ICD-10-CM | POA: Diagnosis not present

## 2020-07-25 DIAGNOSIS — D500 Iron deficiency anemia secondary to blood loss (chronic): Secondary | ICD-10-CM | POA: Diagnosis not present

## 2020-07-25 DIAGNOSIS — L89152 Pressure ulcer of sacral region, stage 2: Secondary | ICD-10-CM | POA: Diagnosis present

## 2020-07-25 DIAGNOSIS — E785 Hyperlipidemia, unspecified: Secondary | ICD-10-CM | POA: Diagnosis present

## 2020-07-25 DIAGNOSIS — Z8249 Family history of ischemic heart disease and other diseases of the circulatory system: Secondary | ICD-10-CM | POA: Diagnosis not present

## 2020-07-25 DIAGNOSIS — I9589 Other hypotension: Secondary | ICD-10-CM | POA: Diagnosis not present

## 2020-07-25 DIAGNOSIS — D649 Anemia, unspecified: Secondary | ICD-10-CM

## 2020-07-25 DIAGNOSIS — D509 Iron deficiency anemia, unspecified: Secondary | ICD-10-CM | POA: Diagnosis present

## 2020-07-25 DIAGNOSIS — G8250 Quadriplegia, unspecified: Secondary | ICD-10-CM | POA: Diagnosis present

## 2020-07-25 DIAGNOSIS — G903 Multi-system degeneration of the autonomic nervous system: Secondary | ICD-10-CM | POA: Diagnosis not present

## 2020-07-25 DIAGNOSIS — R0602 Shortness of breath: Secondary | ICD-10-CM

## 2020-07-25 DIAGNOSIS — R042 Hemoptysis: Secondary | ICD-10-CM | POA: Diagnosis not present

## 2020-07-25 DIAGNOSIS — Z931 Gastrostomy status: Secondary | ICD-10-CM

## 2020-07-25 DIAGNOSIS — G904 Autonomic dysreflexia: Secondary | ICD-10-CM | POA: Diagnosis present

## 2020-07-25 DIAGNOSIS — E8809 Other disorders of plasma-protein metabolism, not elsewhere classified: Secondary | ICD-10-CM | POA: Diagnosis not present

## 2020-07-25 DIAGNOSIS — M25511 Pain in right shoulder: Secondary | ICD-10-CM | POA: Diagnosis present

## 2020-07-25 DIAGNOSIS — R748 Abnormal levels of other serum enzymes: Secondary | ICD-10-CM | POA: Diagnosis not present

## 2020-07-25 DIAGNOSIS — R9389 Abnormal findings on diagnostic imaging of other specified body structures: Secondary | ICD-10-CM

## 2020-07-25 DIAGNOSIS — T17490A Other foreign object in trachea causing asphyxiation, initial encounter: Secondary | ICD-10-CM | POA: Diagnosis present

## 2020-07-25 DIAGNOSIS — F32A Depression, unspecified: Secondary | ICD-10-CM | POA: Diagnosis present

## 2020-07-25 DIAGNOSIS — J690 Pneumonitis due to inhalation of food and vomit: Principal | ICD-10-CM | POA: Diagnosis present

## 2020-07-25 DIAGNOSIS — D72829 Elevated white blood cell count, unspecified: Secondary | ICD-10-CM | POA: Diagnosis not present

## 2020-07-25 DIAGNOSIS — R140 Abdominal distension (gaseous): Secondary | ICD-10-CM

## 2020-07-25 DIAGNOSIS — J9621 Acute and chronic respiratory failure with hypoxia: Secondary | ICD-10-CM | POA: Diagnosis not present

## 2020-07-25 DIAGNOSIS — Z800 Family history of malignant neoplasm of digestive organs: Secondary | ICD-10-CM | POA: Diagnosis not present

## 2020-07-25 DIAGNOSIS — E871 Hypo-osmolality and hyponatremia: Secondary | ICD-10-CM | POA: Diagnosis not present

## 2020-07-25 DIAGNOSIS — R1310 Dysphagia, unspecified: Secondary | ICD-10-CM | POA: Diagnosis present

## 2020-07-25 DIAGNOSIS — I10 Essential (primary) hypertension: Secondary | ICD-10-CM | POA: Diagnosis present

## 2020-07-25 DIAGNOSIS — R739 Hyperglycemia, unspecified: Secondary | ICD-10-CM | POA: Diagnosis not present

## 2020-07-25 DIAGNOSIS — J9811 Atelectasis: Secondary | ICD-10-CM | POA: Diagnosis not present

## 2020-07-25 DIAGNOSIS — R0682 Tachypnea, not elsewhere classified: Secondary | ICD-10-CM

## 2020-07-25 DIAGNOSIS — W01190S Fall on same level from slipping, tripping and stumbling with subsequent striking against furniture, sequela: Secondary | ICD-10-CM | POA: Diagnosis present

## 2020-07-25 DIAGNOSIS — Z8674 Personal history of sudden cardiac arrest: Secondary | ICD-10-CM

## 2020-07-25 DIAGNOSIS — S14109S Unspecified injury at unspecified level of cervical spinal cord, sequela: Secondary | ICD-10-CM | POA: Diagnosis not present

## 2020-07-25 DIAGNOSIS — S14105S Unspecified injury at C5 level of cervical spinal cord, sequela: Secondary | ICD-10-CM

## 2020-07-25 DIAGNOSIS — L89154 Pressure ulcer of sacral region, stage 4: Secondary | ICD-10-CM | POA: Diagnosis not present

## 2020-07-25 DIAGNOSIS — Z515 Encounter for palliative care: Secondary | ICD-10-CM | POA: Diagnosis not present

## 2020-07-25 DIAGNOSIS — Z79899 Other long term (current) drug therapy: Secondary | ICD-10-CM

## 2020-07-25 DIAGNOSIS — Z20822 Contact with and (suspected) exposure to covid-19: Secondary | ICD-10-CM | POA: Diagnosis present

## 2020-07-25 DIAGNOSIS — J9601 Acute respiratory failure with hypoxia: Secondary | ICD-10-CM

## 2020-07-25 DIAGNOSIS — J9620 Acute and chronic respiratory failure, unspecified whether with hypoxia or hypercapnia: Secondary | ICD-10-CM

## 2020-07-25 DIAGNOSIS — R0902 Hypoxemia: Secondary | ICD-10-CM

## 2020-07-25 LAB — CBC WITH DIFFERENTIAL/PLATELET
Abs Immature Granulocytes: 0.06 10*3/uL (ref 0.00–0.07)
Basophils Absolute: 0 10*3/uL (ref 0.0–0.1)
Basophils Relative: 0 %
Eosinophils Absolute: 0.2 10*3/uL (ref 0.0–0.5)
Eosinophils Relative: 3 %
HCT: 27.1 % — ABNORMAL LOW (ref 39.0–52.0)
Hemoglobin: 8.6 g/dL — ABNORMAL LOW (ref 13.0–17.0)
Immature Granulocytes: 1 %
Lymphocytes Relative: 13 %
Lymphs Abs: 1.2 10*3/uL (ref 0.7–4.0)
MCH: 28.1 pg (ref 26.0–34.0)
MCHC: 31.7 g/dL (ref 30.0–36.0)
MCV: 88.6 fL (ref 80.0–100.0)
Monocytes Absolute: 1.5 10*3/uL — ABNORMAL HIGH (ref 0.1–1.0)
Monocytes Relative: 17 %
Neutro Abs: 5.9 10*3/uL (ref 1.7–7.7)
Neutrophils Relative %: 66 %
Platelets: 421 10*3/uL — ABNORMAL HIGH (ref 150–400)
RBC: 3.06 MIL/uL — ABNORMAL LOW (ref 4.22–5.81)
RDW: 17 % — ABNORMAL HIGH (ref 11.5–15.5)
WBC: 8.9 10*3/uL (ref 4.0–10.5)
nRBC: 0.3 % — ABNORMAL HIGH (ref 0.0–0.2)

## 2020-07-25 LAB — PROCALCITONIN: Procalcitonin: <0.1 ng/mL

## 2020-07-25 LAB — COMPREHENSIVE METABOLIC PANEL
ALT: 64 U/L — ABNORMAL HIGH (ref 0–44)
AST: 47 U/L — ABNORMAL HIGH (ref 15–41)
Albumin: 2.3 g/dL — ABNORMAL LOW (ref 3.5–5.0)
Alkaline Phosphatase: 162 U/L — ABNORMAL HIGH (ref 38–126)
Anion gap: 9 (ref 5–15)
BUN: 26 mg/dL — ABNORMAL HIGH (ref 8–23)
CO2: 28 mmol/L (ref 22–32)
Calcium: 9.7 mg/dL (ref 8.9–10.3)
Chloride: 104 mmol/L (ref 98–111)
Creatinine, Ser: 0.71 mg/dL (ref 0.61–1.24)
GFR, Estimated: >60 mL/min (ref >60)
Glucose, Bld: 72 mg/dL (ref 70–99)
Potassium: 4.1 mmol/L (ref 3.5–5.1)
Sodium: 141 mmol/L (ref 135–145)
Total Bilirubin: 0.7 mg/dL (ref 0.3–1.2)
Total Protein: 8.2 g/dL — ABNORMAL HIGH (ref 6.5–8.1)

## 2020-07-25 LAB — RESPIRATORY PANEL BY RT PCR (FLU A&B, COVID)
Influenza A by PCR: NEGATIVE
Influenza B by PCR: NEGATIVE
SARS Coronavirus 2 by RT PCR: NEGATIVE

## 2020-07-25 LAB — LACTIC ACID, PLASMA
Lactic Acid, Venous: 1.1 mmol/L (ref 0.5–1.9)
Lactic Acid, Venous: 1.4 mmol/L (ref 0.5–1.9)

## 2020-07-25 LAB — BLOOD GAS, VENOUS
Acid-Base Excess: 5.8 mmol/L — ABNORMAL HIGH (ref 0.0–2.0)
Bicarbonate: 30.6 mmol/L — ABNORMAL HIGH (ref 20.0–28.0)
O2 Saturation: 95.2 %
Patient temperature: 37
pCO2, Ven: 45 mmHg (ref 44.0–60.0)
pH, Ven: 7.44 — ABNORMAL HIGH (ref 7.250–7.430)
pO2, Ven: 74 mmHg — ABNORMAL HIGH (ref 32.0–45.0)

## 2020-07-25 LAB — BRAIN NATRIURETIC PEPTIDE: B Natriuretic Peptide: 72.8 pg/mL (ref 0.0–100.0)

## 2020-07-25 LAB — TROPONIN I (HIGH SENSITIVITY)
Troponin I (High Sensitivity): 6 ng/L (ref <18)
Troponin I (High Sensitivity): 5 ng/L (ref <18)

## 2020-07-25 IMAGING — CR DG CHEST 2V
1 series · 2 of 2 positions shown · non-contrast
Comparison: [DATE]

CLINICAL DATA: Shortness of breath.

EXAM:
CHEST - 2 VIEW

[Series 1: dg chest 2 view · 0.14mm/px · 2 of 2 slices shown]
[im 1/2]
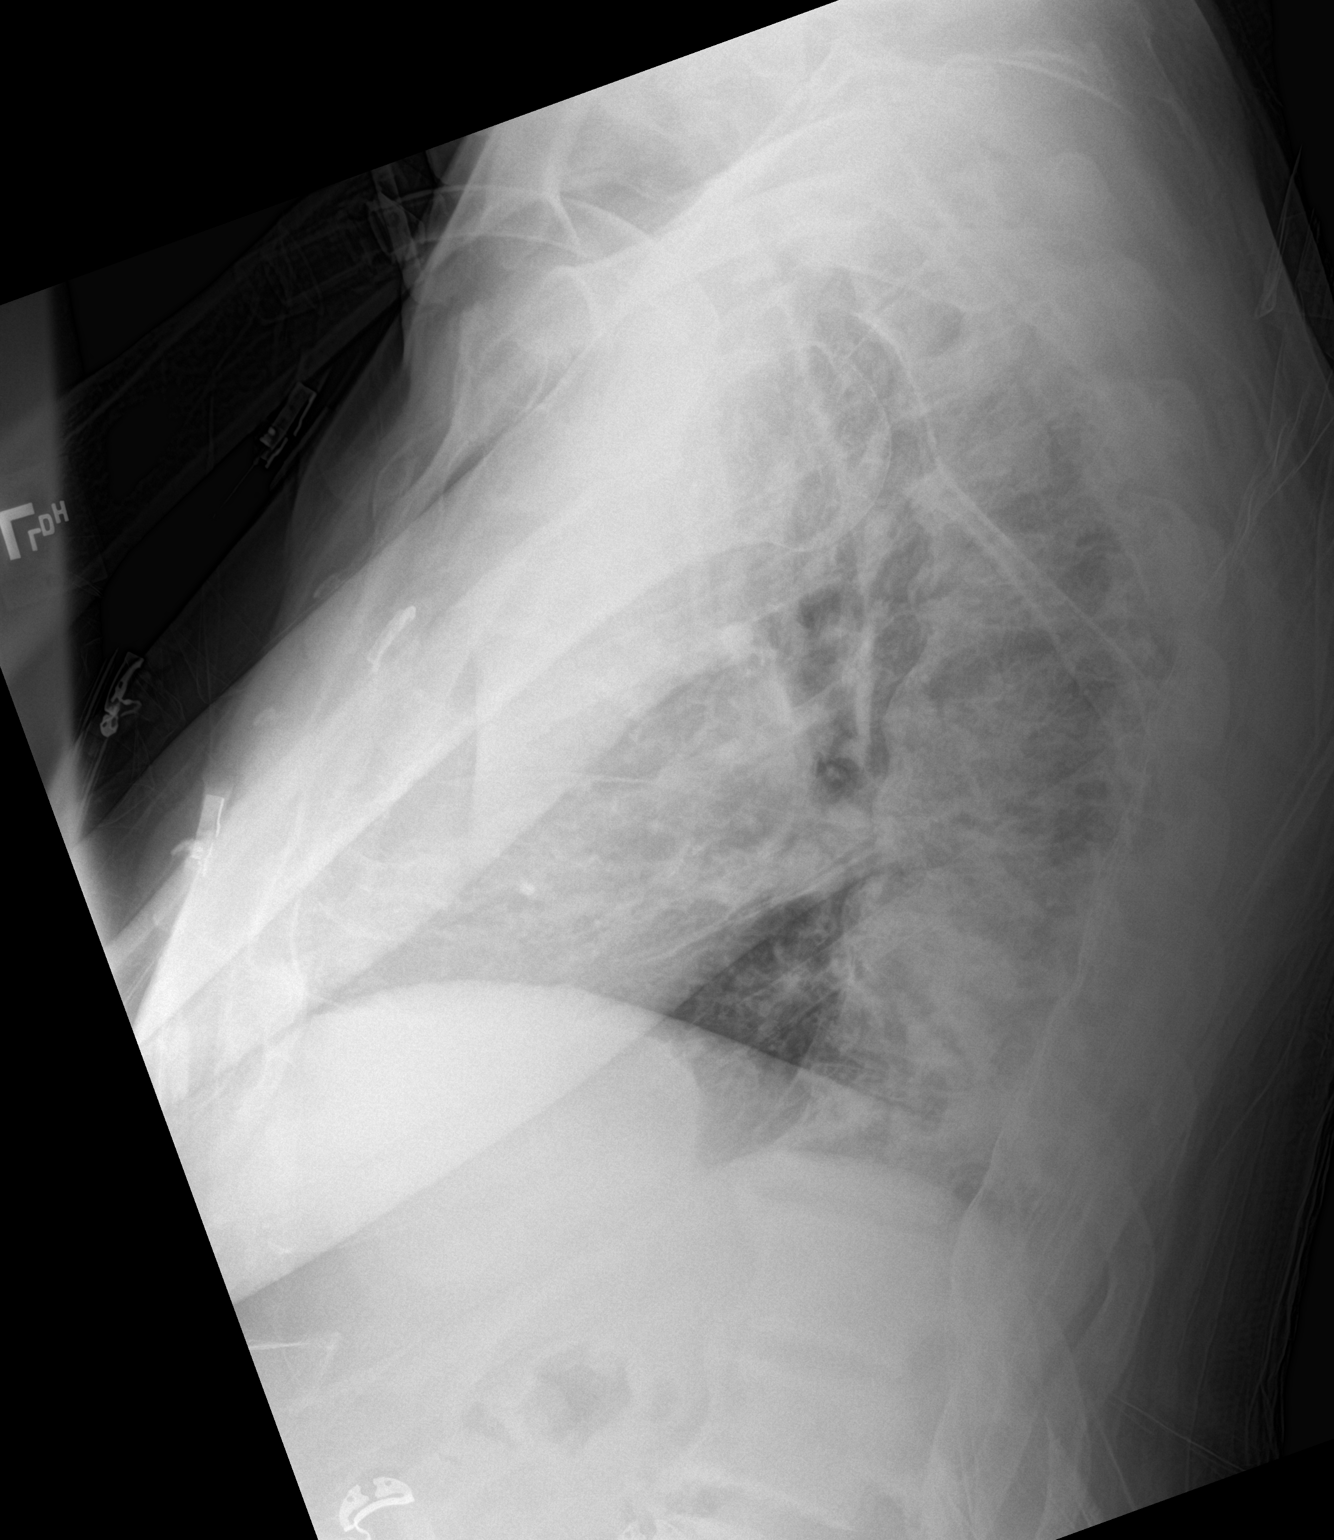
[im 2/2]
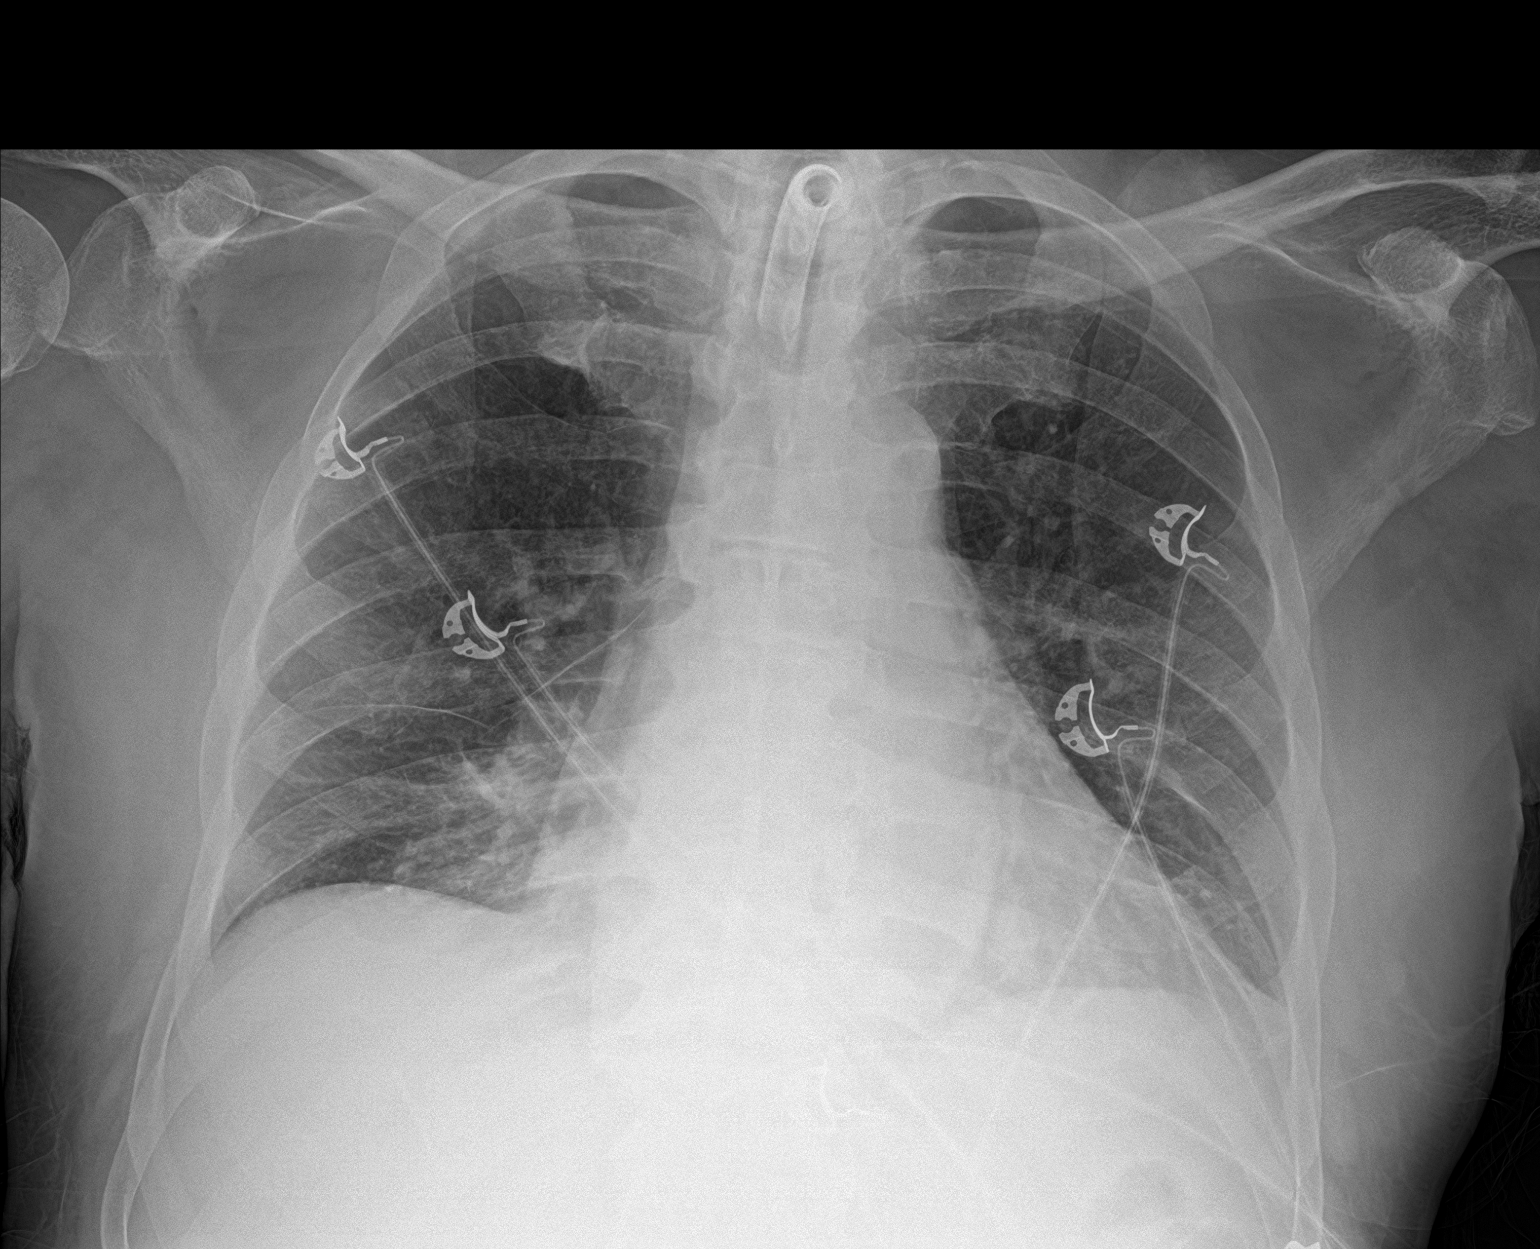

[2 of 2 positions shown; findings below may reference images not displayed]

FINDINGS: Cardiomediastinal silhouette is similar to prior. Bibasilar
opacities, best appreciated on the lateral radiograph. No visible
pleural effusions or pneumothorax. Tracheostomy tube is midline with
the tip at the level of the inferior clavicular heads.
IMPRESSION: Bibasilar airspace opacities, concerning for aspiration and/or
pneumonia.

## 2020-07-25 MED ORDER — ACETAMINOPHEN 160 MG/5ML PO SOLN
320.0000 mg | ORAL | Status: DC | PRN
  Administered 2020-07-25: 325 mg
  Administered 2020-07-28 – 2020-07-31 (×3): 640 mg
  Filled 2020-07-25 (×8): qty 20.3

## 2020-07-25 MED ORDER — SODIUM CHLORIDE 0.9 % IV SOLN: 3.0000 g | Freq: Once | INTRAVENOUS | Status: DC

## 2020-07-25 MED ORDER — CHLORHEXIDINE GLUCONATE 0.12 % MT SOLN
15.0000 mL | Freq: Two times a day (BID) | OROMUCOSAL | Status: DC
  Administered 2020-07-26 – 2020-08-08 (×28): 15 mL via OROMUCOSAL
  Filled 2020-07-25 (×25): qty 15

## 2020-07-25 MED ORDER — VANCOMYCIN HCL 500 MG/100ML IV SOLN
500.0000 mg | Freq: Once | INTRAVENOUS | Status: AC
  Administered 2020-07-26: 500 mg via INTRAVENOUS
  Filled 2020-07-25: qty 100

## 2020-07-25 MED ORDER — GUAIFENESIN 200 MG PO TABS
400.0000 mg | ORAL_TABLET | Freq: Four times a day (QID) | ORAL | Status: DC
  Filled 2020-07-25: qty 2

## 2020-07-25 MED ORDER — FAMOTIDINE 20 MG PO TABS
20.0000 mg | ORAL_TABLET | Freq: Every day | ORAL | Status: DC
  Administered 2020-07-26 – 2020-08-08 (×14): 20 mg
  Filled 2020-07-25 (×14): qty 1

## 2020-07-25 MED ORDER — OSMOLITE 1.5 CAL PO LIQD
474.0000 mL | Freq: Four times a day (QID) | ORAL | Status: DC
  Administered 2020-07-25 – 2020-07-29 (×13): 474 mL
  Administered 2020-07-29: 237 mL

## 2020-07-25 MED ORDER — SODIUM CHLORIDE 0.9 % IV SOLN
INTRAVENOUS | Status: DC
  Administered 2020-07-26: 1000 mL via INTRAVENOUS

## 2020-07-25 MED ORDER — CALCIUM POLYCARBOPHIL 625 MG PO TABS
625.0000 mg | ORAL_TABLET | Freq: Every day | ORAL | Status: DC
  Administered 2020-07-26 – 2020-08-08 (×14): 625 mg
  Filled 2020-07-25 (×15): qty 1

## 2020-07-25 MED ORDER — ASCORBIC ACID 500 MG PO TABS
500.0000 mg | ORAL_TABLET | Freq: Two times a day (BID) | ORAL | Status: DC
  Administered 2020-07-25 – 2020-08-08 (×26): 500 mg
  Filled 2020-07-25 (×27): qty 1

## 2020-07-25 MED ORDER — SACCHAROMYCES BOULARDII 250 MG PO CAPS
250.0000 mg | ORAL_CAPSULE | Freq: Two times a day (BID) | ORAL | Status: DC
  Administered 2020-07-26 – 2020-08-08 (×27): 250 mg
  Filled 2020-07-25 (×29): qty 1

## 2020-07-25 MED ORDER — ADULT MULTIVITAMIN LIQUID CH
5.0000 mL | Freq: Every day | ORAL | Status: DC
  Administered 2020-07-26 – 2020-08-08 (×14): 5 mL
  Filled 2020-07-25 (×2): qty 5
  Filled 2020-07-25: qty 15
  Filled 2020-07-25 (×2): qty 5
  Filled 2020-07-25 (×2): qty 15
  Filled 2020-07-25: qty 5
  Filled 2020-07-25: qty 15
  Filled 2020-07-25: qty 5
  Filled 2020-07-25 (×4): qty 15
  Filled 2020-07-25: qty 5

## 2020-07-25 MED ORDER — FERROUS SULFATE 300 (60 FE) MG/5ML PO SYRP
75.0000 mg | ORAL_SOLUTION | Freq: Every day | ORAL | Status: DC
  Filled 2020-07-25: qty 5

## 2020-07-25 MED ORDER — SODIUM CHLORIDE 0.9 % IV BOLUS
500.0000 mL | Freq: Once | INTRAVENOUS | Status: AC
  Administered 2020-07-25: 500 mL via INTRAVENOUS

## 2020-07-25 MED ORDER — SODIUM CHLORIDE 0.9 % IV SOLN: 250.0000 mL | INTRAVENOUS | Status: DC

## 2020-07-25 MED ORDER — ORAL CARE MOUTH RINSE
15.0000 mL | Freq: Two times a day (BID) | OROMUCOSAL | Status: DC
  Administered 2020-07-26 – 2020-08-08 (×27): 15 mL via OROMUCOSAL
  Filled 2020-07-25 (×2): qty 15

## 2020-07-25 MED ORDER — PROSOURCE TF PO LIQD
45.0000 mL | Freq: Two times a day (BID) | ORAL | Status: DC
  Administered 2020-07-25 – 2020-08-08 (×27): 45 mL
  Filled 2020-07-25 (×28): qty 45

## 2020-07-25 MED ORDER — NOREPINEPHRINE 4 MG/250ML-% IV SOLN
2.0000 ug/min | INTRAVENOUS | Status: DC
  Administered 2020-07-26: 4 ug/min via INTRAVENOUS
  Administered 2020-07-26 (×2): 2 ug/min via INTRAVENOUS
  Filled 2020-07-25 (×2): qty 250

## 2020-07-25 MED ORDER — FLUOXETINE HCL 20 MG/5ML PO SOLN
40.0000 mg | Freq: Every day | ORAL | Status: DC
  Administered 2020-07-26 – 2020-08-08 (×14): 40 mg
  Filled 2020-07-25 (×15): qty 10

## 2020-07-25 MED ORDER — JUVEN PO PACK
1.0000 | PACK | Freq: Two times a day (BID) | ORAL | Status: DC
  Administered 2020-07-26 – 2020-08-08 (×27): 1

## 2020-07-25 MED ORDER — ENOXAPARIN SODIUM 40 MG/0.4ML ~~LOC~~ SOLN: 40.0000 mg | SUBCUTANEOUS | Status: DC

## 2020-07-25 MED ORDER — SCOPOLAMINE 1 MG/3DAYS TD PT72
1.0000 | MEDICATED_PATCH | TRANSDERMAL | Status: DC
  Administered 2020-07-28 – 2020-08-06 (×4): 1.5 mg via TRANSDERMAL
  Filled 2020-07-25 (×4): qty 1

## 2020-07-25 MED ORDER — SODIUM CHLORIDE 0.9 % IV SOLN: INTRAVENOUS | Status: DC

## 2020-07-25 MED ORDER — LACTATED RINGERS IV BOLUS
1000.0000 mL | Freq: Once | INTRAVENOUS | Status: AC
  Administered 2020-07-25: 1000 mL via INTRAVENOUS

## 2020-07-25 MED ORDER — TRAZODONE HCL 50 MG PO TABS
50.0000 mg | ORAL_TABLET | Freq: Every day | ORAL | Status: DC
  Administered 2020-07-25 – 2020-08-08 (×13): 50 mg
  Filled 2020-07-25 (×13): qty 1

## 2020-07-25 MED ORDER — GUAIFENESIN 100 MG/5ML PO SOLN
20.0000 mL | Freq: Four times a day (QID) | ORAL | Status: DC
  Administered 2020-07-26 – 2020-08-08 (×53): 400 mg
  Filled 2020-07-25 (×4): qty 20
  Filled 2020-07-25: qty 10
  Filled 2020-07-25 (×5): qty 20
  Filled 2020-07-25: qty 10
  Filled 2020-07-25 (×25): qty 20
  Filled 2020-07-25: qty 10
  Filled 2020-07-25 (×2): qty 20
  Filled 2020-07-25: qty 10
  Filled 2020-07-25 (×19): qty 20

## 2020-07-25 MED ORDER — VANCOMYCIN HCL 2000 MG/400ML IV SOLN
2000.0000 mg | Freq: Once | INTRAVENOUS | Status: AC
  Administered 2020-07-26: 2000 mg via INTRAVENOUS
  Filled 2020-07-25: qty 400

## 2020-07-25 MED ORDER — VANCOMYCIN HCL IN DEXTROSE 1-5 GM/200ML-% IV SOLN
1000.0000 mg | Freq: Two times a day (BID) | INTRAVENOUS | Status: DC
  Filled 2020-07-25 (×2): qty 200

## 2020-07-25 MED ORDER — HEPARIN SODIUM (PORCINE) 5000 UNIT/ML IJ SOLN: 5000.0000 [IU] | Freq: Three times a day (TID) | INTRAMUSCULAR | Status: DC

## 2020-07-25 MED ORDER — BISACODYL 10 MG RE SUPP
10.0000 mg | Freq: Every day | RECTAL | Status: DC
  Administered 2020-07-25: 10 mg via RECTAL
  Filled 2020-07-25 (×2): qty 1

## 2020-07-25 MED ORDER — FERROUS SULFATE 220 (44 FE) MG/5ML PO ELIX
75.0000 mg | ORAL_SOLUTION | Freq: Every day | ORAL | Status: DC
  Administered 2020-07-26 – 2020-08-08 (×14): 75 mg
  Filled 2020-07-25: qty 1.71
  Filled 2020-07-25 (×4): qty 1.7
  Filled 2020-07-25: qty 1.71
  Filled 2020-07-25 (×2): qty 1.7
  Filled 2020-07-25 (×4): qty 1.71
  Filled 2020-07-25: qty 1.7
  Filled 2020-07-25: qty 1.71
  Filled 2020-07-25: qty 1.7

## 2020-07-25 MED ORDER — MIDODRINE HCL 5 MG PO TABS
10.0000 mg | ORAL_TABLET | Freq: Three times a day (TID) | ORAL | Status: DC
  Administered 2020-07-26 – 2020-08-08 (×40): 10 mg
  Filled 2020-07-25 (×42): qty 2

## 2020-07-25 MED ORDER — BACLOFEN 10 MG PO TABS
5.0000 mg | ORAL_TABLET | Freq: Three times a day (TID) | ORAL | Status: DC
  Administered 2020-07-25 – 2020-07-30 (×14): 5 mg
  Filled 2020-07-25 (×19): qty 0.5

## 2020-07-25 MED ORDER — SIMETHICONE 40 MG/0.6ML PO LIQD
80.0000 mg | Freq: Four times a day (QID) | ORAL | Status: DC
  Administered 2020-07-25 – 2020-08-08 (×43): 80 mg
  Filled 2020-07-25 (×26): qty 1.2
  Filled 2020-07-25: qty 0.6
  Filled 2020-07-25 (×41): qty 1.2

## 2020-07-25 MED ORDER — PIPERACILLIN-TAZOBACTAM 3.375 G IVPB
3.3750 g | Freq: Three times a day (TID) | INTRAVENOUS | Status: DC
  Administered 2020-07-25 – 2020-07-26 (×3): 3.375 g via INTRAVENOUS
  Filled 2020-07-25 (×3): qty 50

## 2020-07-25 MED ORDER — FLUDROCORTISONE ACETATE 0.1 MG PO TABS
0.2000 mg | ORAL_TABLET | Freq: Every day | ORAL | Status: DC
  Administered 2020-07-26 – 2020-08-08 (×14): 0.2 mg
  Filled 2020-07-25 (×15): qty 2

## 2020-07-25 NOTE — ED Triage Notes (Signed)
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  He has trach and on arrival is very wet and gurgly sounding.  Sat upper 90s.  Suctioned for clear fluids by EMS in hall as room was not ready.    Denies pain.

## 2020-07-25 NOTE — Assessment & Plan Note (Signed)
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  D/w daughter about ENDIT cream for prevention of sacral ulcers.

## 2020-07-25 NOTE — ED Notes (Signed)
[name] (daughter) [phone]

## 2020-07-25 NOTE — Assessment & Plan Note (Signed)
RESOLVED> pt is not on any home meds and he is on florinef.

## 2020-07-25 NOTE — Telephone Encounter (Signed)
Thank you- I was told after Pam spoke with them- I appreciate the heads up- we've called the ER to let them know our concerns- ML

## 2020-07-25 NOTE — ED Provider Notes (Signed)
[facility] Emergency Department Provider Note  ____________________________________________   First MD Initiated Contact with Patient 07/25/20 1306     (approximate)  I have reviewed the triage vital signs and the nursing notes.   HISTORY  Chief Complaint Shortness of Breath   HPI Ryan Day is a 69 y.o. male with a past medical history of cervical spinal cord injury, hypertension, and autonomic instability, PEG dependent for all nutrition and for medication access, and trach dependent who presents via EMS from home for assessment of some tachypnea congestion and rhonchorous breath sounds have been worsening over the past 1 to 2 days.  History is very limited from the patient secondary to tachypnea and severe congestion and rhonchi.  He denies any acute pain including in his chest, abdomen, head, ears, nose, throat, or extremities.  He denies any vomiting, diarrhea, dysuria.  He does endorse some congestion and cough.     Past Medical History:  Diagnosis Date  . Acute on chronic respiratory failure with hypoxia ([place])   . Acute renal injury due to hypovolemia ([place])   . Autonomic instability   . Cardiac arrest ([place])   . Cervical spinal cord injury, sequela ([place])   . Elevated alkaline phosphatase level   . 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 Hyperlipidemia   . Hypertension     Patient Active Problem List   Diagnosis Date Noted  . Palliative care by specialist   . Salivary secretion   . Spasticity   . Status post tracheostomy ([place])   . Spinal cord injury, cervical region, sequela ([place]) 06/23/2020  . Pressure injury of skin 06/23/2020  . S/P percutaneous endoscopic gastrostomy (PEG) tube placement ([place])   . Tracheostomy in place [facility])   . Quadriplegia ([place])   . Neurogenic orthostatic hypotension ([place])   . Acute on chronic anemia   . Hypernatremia   . Neurogenic bladder   . Neurogenic bowel   . Acute on chronic respiratory failure  with hypoxia ([place])   . Autonomic instability   . Cardiac arrest ([place])   . Cervical spinal cord injury, sequela ([place])   . Acute renal injury due to hypovolemia ([place])   . Family history of stomach cancer   . DNR (do not resuscitate) discussion   . Exercise-induced leg cramps 05/13/2017  . Elevated alkaline phosphatase level 03/13/2015  . Hyperlipidemia   . Hypertension     Past Surgical History:  Procedure Laterality Date  . COLONOSCOPY WITH PROPOFOL N/A 12/05/2017   Procedure: COLONOSCOPY WITH PROPOFOL;  Surgeon: Lin Landsman, MD;  Location: [facility] ENDOSCOPY;  Service: Gastroenterology;  Laterality: N/A;  . ESOPHAGOGASTRODUODENOSCOPY  12/05/2017   Procedure: ESOPHAGOGASTRODUODENOSCOPY (EGD);  Surgeon: Lin Landsman, MD;  Location: [facility] ENDOSCOPY;  Service: Gastroenterology;;    Prior to Admission medications   Medication Sig Start Date End Date Taking? Authorizing Provider  acetaminophen (TYLENOL) 160 MG/5ML solution Place 10-20 mLs (320-640 mg total) into feeding tube every 4 (four) hours as needed for mild pain. 07/18/20   Love, Ivan Anchors, PA-C  ascorbic acid (VITAMIN C) 500 MG tablet Place 1 tablet (500 mg total) into feeding tube 2 (two) times daily. 07/18/20   Love, Ivan Anchors, PA-C  Baclofen 5 MG TABS Place 5 mg into feeding tube 3 (three) times daily. 07/20/20   Love, Ivan Anchors, PA-C  bisacodyl (DULCOLAX) 10 MG suppository Place 1 suppository (10 mg total) rectally at bedtime. 07/20/20   Bary Leriche, PA-C  chlorhexidine (  PERIDEX) 0.12 % solution 15 mLs by Mouth Rinse route 2 (two) times daily. 07/20/20   Love, Ivan Anchors, PA-C  collagenase (SANTYL) ointment Apply topically daily. 07/20/20   Love, Ivan Anchors, PA-C  enoxaparin (LOVENOX) 40 MG/0.4ML injection Inject 0.4 mLs (40 mg total) into the skin daily. 07/20/20   Love, Ivan Anchors, PA-C  famotidine (PEPCID) 20 MG tablet Place 1 tablet (20 mg total) into feeding tube daily. 07/20/20   Love, Ivan Anchors, PA-C  ferrous sulfate 300  (60 Fe) MG/5ML syrup Place 1.3 mLs (78 mg total) into feeding tube daily. 07/20/20   Love, Ivan Anchors, PA-C  fludrocortisone (FLORINEF) 0.1 MG tablet Place 2 tablets (0.2 mg total) into feeding tube daily. 07/20/20   Love, Ivan Anchors, PA-C  FLUoxetine (PROZAC) 20 MG/5ML solution Place 10 mLs (40 mg total) into feeding tube daily. 07/20/20   Love, Ivan Anchors, PA-C  guaiFENesin 200 MG tablet Place 2 tablets (400 mg total) into feeding tube every 6 (six) hours. 07/20/20   Love, Ivan Anchors, PA-C  lidocaine (LIDODERM) 5 % Place 1 patch onto the skin daily. Remove & Discard patch within 12 hours or as directed by MD 07/20/20   Love, Ivan Anchors, PA-C  midodrine (PROAMATINE) 10 MG tablet Place 1 tablet (10 mg total) into feeding tube 3 (three) times daily with meals. 07/20/20   Love, Ivan Anchors, PA-C  Mouthwashes (MOUTH RINSE) LIQD solution 15 mLs by Mouth Rinse route 2 times daily at 12 noon and 4 pm. 07/20/20   Love, Ivan Anchors, PA-C  Multiple Vitamin (MULTIVITAMIN) LIQD Place 5 mLs into feeding tube daily. 07/20/20   Love, Ivan Anchors, PA-C  nutrition supplement, JUVEN, (JUVEN) PACK Place 1 packet into feeding tube 2 (two) times daily between meals. 07/20/20   Love, Ivan Anchors, PA-C  Nutritional Supplements (FEEDING SUPPLEMENT, OSMOLITE 1.5 CAL,) LIQD Place 474 mLs into feeding tube 4 (four) times daily. 07/20/20   Love, Ivan Anchors, PA-C  Nutritional Supplements (FEEDING SUPPLEMENT, PROSOURCE TF,) liquid Place 45 mLs into feeding tube 2 (two) times daily. 07/20/20   Love, Ivan Anchors, PA-C  ondansetron (ZOFRAN) 4 MG tablet Place 1 tablet (4 mg total) into feeding tube every 8 (eight) hours as needed for nausea, vomiting or refractory nausea / vomiting. 07/20/20   Love, Ivan Anchors, PA-C  polycarbophil (FIBERCON) 625 MG tablet Place 1 tablet (625 mg total) into feeding tube daily. 07/20/20   Love, Ivan Anchors, PA-C  saccharomyces boulardii (FLORASTOR) 250 MG capsule Place 1 capsule (250 mg total) into feeding tube 2 (two) times daily.  07/20/20   Love, Ivan Anchors, PA-C  scopolamine (TRANSDERM-SCOP) 1 MG/3DAYS Place 1 patch (1.5 mg total) onto the skin every 3 (three) days. 07/20/20   Love, Ivan Anchors, PA-C  sennosides (SENOKOT) 8.8 MG/5ML syrup Place 5 mLs into feeding tube daily at 6 (six) AM. 07/20/20   Love, Ivan Anchors, PA-C  simethicone (MYLICON) 40 IO/9.6EX drops Place 1.2 mLs (80 mg total) into feeding tube 4 (four) times daily. 07/20/20   Love, Ivan Anchors, PA-C  traZODone (DESYREL) 50 MG tablet Place 1 tablet (50 mg total) into feeding tube at bedtime. 07/20/20   Love, Ivan Anchors, PA-C  Water For Irrigation, Sterile (FREE WATER) SOLN Place 400 mLs into feeding tube every 4 (four) hours. 07/20/20   Love, Ivan Anchors, PA-C  zinc sulfate 220 (50 Zn) MG capsule Place 1 capsule (220 mg total) into feeding tube daily. 07/20/20   Bary Leriche, PA-C    Allergies  Patient has no known allergies.  Family History  Problem Relation Age of Onset  . Cancer Father        lung  . Cancer Brother        throat  . Heart disease Brother 18  . Liver cancer Brother   . Alcohol abuse Brother     Social History Social History   Tobacco Use  . Smoking status: Never Smoker  . Smokeless tobacco: Never Used  Substance Use Topics  . Alcohol use: Yes    Comment: 1 or less  . Drug use: No    Review of Systems  Review of Systems  Constitutional: Negative for chills and fever.  HENT: Positive for congestion. Negative for sore throat.   Eyes: Negative for pain.  Respiratory: Positive for cough and shortness of breath. Negative for stridor.   Cardiovascular: Negative for chest pain.  Gastrointestinal: Negative for vomiting.  Skin: Negative for rash.  Neurological: Negative for seizures, loss of consciousness and headaches.  Psychiatric/Behavioral: Negative for suicidal ideas.  All other systems reviewed and are negative.     ____________________________________________   PHYSICAL EXAM:  VITAL SIGNS: ED Triage Vitals [07/25/20  1300]  Enc Vitals Group     BP 111/64     Pulse Rate 86     Resp (!) 28     Temp      Temp src      SpO2 94 %     Weight 238 lb 1.6 oz (108 kg)     Height 6\' 5"  (1.956 m)     Head Circumference      Peak Flow      Pain Score 0     Pain Loc      Pain Edu?      Excl. in [place]?    Vitals:   07/25/20 1350 07/25/20 1355  BP:    Pulse: 63 (!) 38  Resp: (!) 29 (!) 24  Temp:    SpO2: 93% 95%   Physical Exam Vitals and nursing note reviewed.  Constitutional:      Appearance: He is well-developed.  HENT:     Head: Normocephalic and atraumatic.     Right Ear: External ear normal.     Left Ear: External ear normal.     Nose: Nose normal.  Eyes:     Conjunctiva/sclera: Conjunctivae normal.  Cardiovascular:     Rate and Rhythm: Normal rate and regular rhythm.     Heart sounds: No murmur heard.   Pulmonary:     Effort: Pulmonary effort is normal. Tachypnea present. No respiratory distress.     Breath sounds: Examination of the right-upper field reveals rhonchi. Examination of the left-upper field reveals rhonchi. Examination of the right-middle field reveals rhonchi. Examination of the left-middle field reveals rhonchi. Examination of the right-lower field reveals rhonchi. Examination of the left-lower field reveals rhonchi. Rhonchi present.  Abdominal:     Palpations: Abdomen is soft.     Tenderness: There is no abdominal tenderness.  Musculoskeletal:     Cervical back: Neck supple.     Right lower leg: No edema.     Left lower leg: No edema.  Skin:    General: Skin is warm and dry.     Capillary Refill: Capillary refill takes less than 2 seconds.  Neurological:     Mental Status: He is alert.  Psychiatric:        Mood and Affect: Mood normal.     Skin around his  tracheostomy appears clean dry and intact without streaking erythema fluctuance drainage or bleeding. ____________________________________________   LABS (all labs ordered are listed, but only abnormal results  are displayed)  Labs Reviewed  CBC WITH DIFFERENTIAL/PLATELET - Abnormal; Notable for the following components:      Result Value   RBC 3.06 (*)    Hemoglobin 8.6 (*)    HCT 27.1 (*)    RDW 17.0 (*)    Platelets 421 (*)    nRBC 0.3 (*)    Monocytes Absolute 1.5 (*)    All other components within normal limits  COMPREHENSIVE METABOLIC PANEL - Abnormal; Notable for the following components:   BUN 26 (*)    Total Protein 8.2 (*)    Albumin 2.3 (*)    AST 47 (*)    ALT 64 (*)    Alkaline Phosphatase 162 (*)    All other components within normal limits  BLOOD GAS, VENOUS - Abnormal; Notable for the following components:   pH, Ven 7.44 (*)    pO2, Ven 74.0 (*)    Bicarbonate 30.6 (*)    Acid-Base Excess 5.8 (*)    All other components within normal limits  RESPIRATORY PANEL BY RT PCR (FLU A&B, COVID)  CULTURE, BLOOD (ROUTINE X 2)  CULTURE, BLOOD (ROUTINE X 2)  CULTURE, RESPIRATORY  BRAIN NATRIURETIC PEPTIDE  PROCALCITONIN  LACTIC ACID, PLASMA  LACTIC ACID, PLASMA  TROPONIN I (HIGH SENSITIVITY)  TROPONIN I (HIGH SENSITIVITY)   ____________________________________________  EKG  Sinus rhythm with a ventricular of 64, normal axis, unremarkable intervals, no clear evidence of acute ischemia or other significant underlying arrhythmia. ____________________________________________  RADIOLOGY  ED MD interpretation: Bilateral opacities concerning for pneumonia.  No pneumothorax, large effusion, significant edema, or other clear acute thoracic process.  Official radiology report(s): DG Chest 2 View  Result Date: 07/25/2020 CLINICAL DATA:  Shortness of breath. EXAM: CHEST - 2 VIEW COMPARISON:  07/23/2020 FINDINGS: Cardiomediastinal silhouette is similar to prior. Bibasilar opacities, best appreciated on the lateral radiograph. No visible pleural effusions or pneumothorax. Tracheostomy tube is midline with the tip at the level of the inferior clavicular heads. IMPRESSION: Bibasilar  airspace opacities, concerning for aspiration and/or pneumonia. Electronically Signed   By: Margaretha [place] MD   On: 07/25/2020 13:59    ____________________________________________   PROCEDURES  Procedure(s) performed (including Critical Care):  .1-3 Lead EKG Interpretation Performed by: Lucrezia Starch, MD Authorized by: Lucrezia Starch, MD     Interpretation: normal     ECG rate assessment: normal     Rhythm: sinus rhythm     Ectopy: none     Conduction: normal       ____________________________________________   INITIAL IMPRESSION / ASSESSMENT AND PLAN / ED COURSE        Patient presents with [place] to history exam for assessment of congestion rhonchi cough and tachypnea.  Patient sister also arrived at bedside while he was in the emergency room ensure that he had some hypoxia last night with SPO2 down to 88% but this improved after he sat upright.  On arrival patient is noted to be tachypneic with a respiratory of 28 otherwise stable vital signs on room air.  Differential includes but is not limited to volume overload, pneumonia, arrhythmia, acute anemia, metabolic derangements, pneumothorax, and symptomatic effusion.  Chest x-ray is concerning for aspiration pneumonia.  No evidence of pneumothorax or significant effusion.  Patient does not appear otherwise volume overloaded on exam.  His BNP is 72  and a very low suspicion for CHF.  CMP shows some hypoalbuminemia which is present on CMP obtained 2 days ago as well as mildly elevated LFTs which are also present 2 days ago.  No significant electrolyte or metabolic derangements.  CBC remarkable for normal WBCs and hemoglobin of 8.6 low it seems patient has had hemoglobins in the 8 range going back to 2 weeks ago when it was 8.  Low suspicion for ACS given patient denies any chest pain has a reassuring EKG and nonelevated troponin.  VBG shows no evidence of hypercarbic respiratory failure.  I will plan to admit to medicine  service for further evaluation management of aspiration ammonia.        ____________________________________________   FINAL CLINICAL IMPRESSION(S) / ED DIAGNOSES  Final diagnoses:  Aspiration pneumonia of both lungs, unspecified aspiration pneumonia type, unspecified part of lung ([place])  Tracheostomy in place ([place])  Hypoalbuminemia  Transaminitis  Low hemoglobin  Tachypnea    Medications  Ampicillin-Sulbactam (UNASYN) 3 g in sodium chloride 0.9 % 100 mL IVPB (has no administration in time range)  lactated ringers bolus 1,000 mL (has no administration in time range)     ED Discharge Orders    None       Note:  This document was prepared using Dragon voice recognition software and may include unintentional dictation errors.   Lucrezia Starch, MD 07/25/20 [phone]

## 2020-07-25 NOTE — H&P (Addendum)
History and Physical    Ryan Day HYI:[id number] DOB: 06-Sep-1951 DOA: 07/25/2020  PCP: Valerie Roys, DO    Patient coming from:  home   Chief Complaint:  SOB   HPI: Ryan Day is a 69 y.o. 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 After fall pt was managed at [facility] in [place] for 2 months  And has rehab at [facility]. Pt had  Trach and peg during [place] stay . Pt is alert and awake and oriented x 3. Pt has been coughing and copious amount of sputum.   ED Course:  Vitals:   07/25/20 2215 07/25/20 2245 07/25/20 2255 07/25/20 2300  BP: (!) 92/55 (!) 77/53 101/60 116/68  Pulse: 64 60 (!) 55 67  Resp: 19 (!) 26 (!) 24 18  Temp:      TempSrc:      SpO2: 93% (!) 89% 92% 97%  Weight:      Height:       Labs ares stable to prior with aki and , mild elevated lft and anemia.  Review of Systems:  Review of Systems  Respiratory: Positive for cough, sputum production, shortness of breath and wheezing.   All other systems reviewed and are negative.   Past Medical History:  Diagnosis Date  . Acute on chronic respiratory failure with hypoxia ([place])   . Acute renal injury due to hypovolemia ([place])   . Autonomic instability   . Cardiac arrest ([place])   . Cervical spinal cord injury, sequela ([place])   . Elevated alkaline phosphatase level   . 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 Hyperlipidemia   . Hypertension     Past Surgical History:  Procedure Laterality Date  . COLONOSCOPY WITH PROPOFOL N/A 12/05/2017   Procedure: COLONOSCOPY WITH PROPOFOL;  Surgeon: Lin Landsman, MD;  Location: [facility] ENDOSCOPY;  Service: Gastroenterology;  Laterality: N/A;  . ESOPHAGOGASTRODUODENOSCOPY  12/05/2017   Procedure: ESOPHAGOGASTRODUODENOSCOPY (EGD);  Surgeon: Lin Landsman, MD;  Location: [facility] ENDOSCOPY;  Service: Gastroenterology;;    reports that he has never  smoked. He has never used smokeless tobacco. He reports current alcohol use. He reports that he does not use drugs.  No Known Allergies  Family History  Problem Relation Age of Onset  . Cancer Father        lung  . Cancer Brother        throat  . Heart disease Brother 82  . Liver cancer Brother   . Alcohol abuse Brother     Prior to Admission medications   Medication Sig Start Date End Date Taking? Authorizing Provider  acetaminophen (TYLENOL) 160 MG/5ML solution Place 10-20 mLs (320-640 mg total) into feeding tube every 4 (four) hours as needed for mild pain. 07/18/20   Love, Ivan Anchors, PA-C  ascorbic acid (VITAMIN C) 500 MG tablet Place 1 tablet (500 mg total) into feeding tube 2 (two) times daily. 07/18/20   Love, Ivan Anchors, PA-C  Baclofen 5 MG TABS Place 5 mg into feeding tube 3 (three) times daily. 07/20/20   Love, Ivan Anchors, PA-C  bisacodyl (DULCOLAX) 10 MG suppository Place 1 suppository (10 mg total) rectally at bedtime. 07/20/20   Love, Ivan Anchors, PA-C  chlorhexidine (PERIDEX) 0.12 % solution 15 mLs by Mouth Rinse route 2 (two) times daily. 07/20/20   Love, Ivan Anchors, PA-C  collagenase (SANTYL) ointment Apply topically daily.  07/20/20   Love, Ivan Anchors, PA-C  enoxaparin (LOVENOX) 40 MG/0.4ML injection Inject 0.4 mLs (40 mg total) into the skin daily. 07/20/20   Love, Ivan Anchors, PA-C  famotidine (PEPCID) 20 MG tablet Place 1 tablet (20 mg total) into feeding tube daily. 07/20/20   Love, Ivan Anchors, PA-C  ferrous sulfate 300 (60 Fe) MG/5ML syrup Place 1.3 mLs (78 mg total) into feeding tube daily. 07/20/20   Love, Ivan Anchors, PA-C  fludrocortisone (FLORINEF) 0.1 MG tablet Place 2 tablets (0.2 mg total) into feeding tube daily. 07/20/20   Love, Ivan Anchors, PA-C  FLUoxetine (PROZAC) 20 MG/5ML solution Place 10 mLs (40 mg total) into feeding tube daily. 07/20/20   Love, Ivan Anchors, PA-C  guaiFENesin 200 MG tablet Place 2 tablets (400 mg total) into feeding tube every 6 (six) hours. 07/20/20   Love,  Ivan Anchors, PA-C  lidocaine (LIDODERM) 5 % Place 1 patch onto the skin daily. Remove & Discard patch within 12 hours or as directed by MD 07/20/20   Love, Ivan Anchors, PA-C  midodrine (PROAMATINE) 10 MG tablet Place 1 tablet (10 mg total) into feeding tube 3 (three) times daily with meals. 07/20/20   Love, Ivan Anchors, PA-C  Mouthwashes (MOUTH RINSE) LIQD solution 15 mLs by Mouth Rinse route 2 times daily at 12 noon and 4 pm. 07/20/20   Love, Ivan Anchors, PA-C  Multiple Vitamin (MULTIVITAMIN) LIQD Place 5 mLs into feeding tube daily. 07/20/20   Love, Ivan Anchors, PA-C  nutrition supplement, JUVEN, (JUVEN) PACK Place 1 packet into feeding tube 2 (two) times daily between meals. 07/20/20   Love, Ivan Anchors, PA-C  Nutritional Supplements (FEEDING SUPPLEMENT, OSMOLITE 1.5 CAL,) LIQD Place 474 mLs into feeding tube 4 (four) times daily. 07/20/20   Love, Ivan Anchors, PA-C  Nutritional Supplements (FEEDING SUPPLEMENT, PROSOURCE TF,) liquid Place 45 mLs into feeding tube 2 (two) times daily. 07/20/20   Love, Ivan Anchors, PA-C  ondansetron (ZOFRAN) 4 MG tablet Place 1 tablet (4 mg total) into feeding tube every 8 (eight) hours as needed for nausea, vomiting or refractory nausea / vomiting. 07/20/20   Love, Ivan Anchors, PA-C  polycarbophil (FIBERCON) 625 MG tablet Place 1 tablet (625 mg total) into feeding tube daily. 07/20/20   Love, Ivan Anchors, PA-C  saccharomyces boulardii (FLORASTOR) 250 MG capsule Place 1 capsule (250 mg total) into feeding tube 2 (two) times daily. 07/20/20   Love, Ivan Anchors, PA-C  scopolamine (TRANSDERM-SCOP) 1 MG/3DAYS Place 1 patch (1.5 mg total) onto the skin every 3 (three) days. 07/20/20   Love, Ivan Anchors, PA-C  sennosides (SENOKOT) 8.8 MG/5ML syrup Place 5 mLs into feeding tube daily at 6 (six) AM. 07/20/20   Love, Ivan Anchors, PA-C  simethicone (MYLICON) 40 ZO/1.0RU drops Place 1.2 mLs (80 mg total) into feeding tube 4 (four) times daily. 07/20/20   Love, Ivan Anchors, PA-C  traZODone (DESYREL) 50 MG tablet Place 1  tablet (50 mg total) into feeding tube at bedtime. 07/20/20   Love, Ivan Anchors, PA-C  Water For Irrigation, Sterile (FREE WATER) SOLN Place 400 mLs into feeding tube every 4 (four) hours. 07/20/20   Love, Ivan Anchors, PA-C  zinc sulfate 220 (50 Zn) MG capsule Place 1 capsule (220 mg total) into feeding tube daily. 07/20/20   Bary Leriche, PA-C    Physical Exam: Vitals:   07/25/20 2215 07/25/20 2245 07/25/20 2255 07/25/20 2300  BP: (!) 92/55 (!) 77/53 101/60 116/68  Pulse: 64 60 (!) 55 67  Resp: 19 (!) 26 (!) 24 18  Temp:      TempSrc:      SpO2: 93% (!) 89% 92% 97%  Weight:      Height:        Physical Exam Vitals and nursing note reviewed.  HENT:     Head: Normocephalic and atraumatic.  Eyes:     Extraocular Movements: Extraocular movements intact.     Pupils: Pupils are equal, round, and reactive to light.  Cardiovascular:     Rate and Rhythm: Normal rate and regular rhythm.  Pulmonary:     Effort: Pulmonary effort is normal.     Breath sounds: Normal breath sounds.  Abdominal:     Palpations: Abdomen is soft. There is no hepatomegaly or mass.     Tenderness: There is no guarding.  Skin:    Findings: Rash present.     Comments: Stage two sacral decub.  Neurological:     General: No focal deficit present.     Mental Status: He is alert and oriented to person, place, and time.      Labs on Admission: I have personally reviewed following labs and imaging studies  CBC: Recent Labs  Lab 07/19/20 1600 07/23/20 0343 07/25/20 1308  WBC 8.6 7.1 8.9  NEUTROABS 5.9  --  5.9  HGB 8.1* 8.2* 8.6*  HCT 26.3* 25.4* 27.1*  MCV 89.5 87.9 88.6  PLT 387 378 161*   Basic Metabolic Panel: Recent Labs  Lab 07/19/20 0602 07/23/20 0343 07/25/20 1308  NA 139 137 141  K 4.3 3.6 4.1  CL 102 100 104  CO2 26 28 28   GLUCOSE 84 103* 72  BUN 26* 21 26*  CREATININE 0.62 0.53* 0.71  CALCIUM 9.1 9.3 9.7   GFR: Estimated Creatinine Clearance: 120.9 mL/min (by C-G formula based on  SCr of 0.71 mg/dL). Liver Function Tests: Recent Labs  Lab 07/23/20 0343 07/25/20 1308  AST 47* 47*  ALT 69* 64*  ALKPHOS 164* 162*  BILITOT 0.5 0.7  PROT 7.4 8.2*  ALBUMIN 2.0* 2.3*   No results for input(s): LIPASE, AMYLASE in the last 168 hours. No results for input(s): AMMONIA in the last 168 hours. Coagulation Profile: No results for input(s): INR, PROTIME in the last 168 hours. Cardiac Enzymes: No results for input(s): CKTOTAL, CKMB, CKMBINDEX, TROPONINI in the last 168 hours. BNP (last 3 results) No results for input(s): PROBNP in the last 8760 hours. HbA1C: No results for input(s): HGBA1C in the last 72 hours. CBG: Recent Labs  Lab 07/19/20 0758 07/19/20 2039 07/20/20 0757  GLUCAP 88 162* 84   Lipid Profile: No results for input(s): CHOL, HDL, LDLCALC, TRIG, CHOLHDL, LDLDIRECT in the last 72 hours. Thyroid Function Tests: No results for input(s): TSH, T4TOTAL, FREET4, T3FREE, THYROIDAB in the last 72 hours. Anemia Panel: No results for input(s): VITAMINB12, FOLATE, FERRITIN, TIBC, IRON, RETICCTPCT in the last 72 hours. Urine analysis:    Component Value Date/Time   COLORURINE YELLOW 07/19/2020 1935   APPEARANCEUR CLOUDY (A) 07/19/2020 1935   APPEARANCEUR Clear 02/01/2020 0920   LABSPEC 1.012 07/19/2020 1935   PHURINE 5.0 07/19/2020 1935   GLUCOSEU NEGATIVE 07/19/2020 1935   HGBUR NEGATIVE 07/19/2020 1935   BILIRUBINUR NEGATIVE 07/19/2020 1935   BILIRUBINUR Negative 02/01/2020 0920   KETONESUR NEGATIVE 07/19/2020 1935   PROTEINUR NEGATIVE 07/19/2020 1935   NITRITE NEGATIVE 07/19/2020 1935   LEUKOCYTESUR LARGE (A) 07/19/2020 1935    Intake/Output Summary (Last 24 hours) at 07/25/2020 2317 Last data  filed at 07/25/2020 2000 Gross per 24 hour  Intake 3746.25 ml  Output --  Net 3746.25 ml   Lab Results  Component Value Date   CREATININE 0.71 07/25/2020   CREATININE 0.53 (L) 07/23/2020   CREATININE 0.62 07/19/2020    COVID-19 Labs  No results for  input(s): DDIMER, FERRITIN, LDH, CRP in the last 72 hours.  Lab Results  Component Value Date   SARSCOV2NAA NEGATIVE 07/25/2020   [place] NEGATIVE 07/23/2020   [place] NEGATIVE 06/23/2020   [place] Not Detected 07/31/2019    Radiological Exams on Admission: DG Chest 2 View  Result Date: 07/25/2020 CLINICAL DATA:  Shortness of breath. EXAM: CHEST - 2 VIEW COMPARISON:  07/23/2020 FINDINGS: Cardiomediastinal silhouette is similar to prior. Bibasilar opacities, best appreciated on the lateral radiograph. No visible pleural effusions or pneumothorax. Tracheostomy tube is midline with the tip at the level of the inferior clavicular heads. IMPRESSION: Bibasilar airspace opacities, concerning for aspiration and/or pneumonia. Electronically Signed   By: Margaretha Sheffield MD   On: 07/25/2020 13:59    EKG: Independently reviewed.  Sinus rhythm 64 with pac.   Assessment/Plan  Aspiration pneumonia [facility]): Assessment & Plan Pt has been sob with copious productive sputum with family having difficulty with clearing his secretions. D/w RT about chest percussion vest.  Pt is alert,awake and oriented but paralyzed.  Pt found to have aspiration pneumonia on chest xray.  We will switch to zosyn.       CPT and vest with continued nebs treatment.      Attribute to aspn pna and expect to resolve but chronic  long term glycopyrrolate may         be a consideration.  Hypertension: Assessment & Plan RESOLVED> pt is not on any home meds and he is on florinef.   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  D/w daughter about ENDIT cream for prevention of sacral ulcers.     Hypotension: Assessment & Plan D/D include Hypovolemic shock due to sepsis, pt was already started on zosyn and we will add vancomycin and transfer pt to stepdown. D/W Ryan Day in ICU about pt and they will monitor pt along  with [place] and consult if needed.ANd we can do pressors in stepdown unit.  Low threshold for levophed. We will obtain cta /ct of scaraum and assess cardiac function along with troponin. Pt has been on Lovenox at home for dvt prophylaxis.  Pt is hypotensive at home per daughters report and is on florinef and midodrine.  Anemia: Assessment & Plan C/H anemia and we will do anemia panel and transfuse one unit in place of IVF , which also may help his BP.   DVT prophylaxis:  Heparin  Code Status:  Full Code  Family Communication:  Ryan Day [phone].   Disposition Plan:  TBD   Consults called:  None  Admission status:   Para Skeans MD [facility] Pager [phone] If 7PM-7AM, please contact night-coverage [email or web address] Password [facility] 07/25/2020, 11:17 PM

## 2020-07-25 NOTE — Telephone Encounter (Signed)
Copied from [place] [id number]. Topic: General - Other >> Jul 25, 2020  1:53 PM Yvette Rack wrote: Reason for CRM: Alyse Low with [facility] request call back from Dr.Aracelia Brinson's nurse. Cb# [phone]

## 2020-07-25 NOTE — Assessment & Plan Note (Signed)
Pt has been sob with copious productive sputum with family having difficulty with clearing his secretions. D/w RT about chest percussion vest.  Pt is alert,awake and oriented but paralyzed.  Pt found to have aspiration pneumonia on chest xray.  We will switch to zosyn.

## 2020-07-25 NOTE — ED Triage Notes (Signed)
Per ems .Marland Kitchen  Trache paitn with congestion.  They suctioned x 1 by ems. Sat was 98% throughout.  176/100 hr60

## 2020-07-25 NOTE — Telephone Encounter (Signed)
Ryan Day is still having a problem with clearing his  secretions. His wife has decided to call EMS to transport him back to the hospital  for breathing clearance.

## 2020-07-25 NOTE — Progress Notes (Signed)
Pharmacy Antibiotic Note  Ryan Day is a 69 y.o. male admitted on 07/25/2020 with sepsis.  Pharmacy has been consulted for Vancomycin dosing.  Plan: Vancomycin 2500 mg IV X 1 ordered to be given in ED on 11/03 @ ~ 0000. Vancomycin 1 gm IV Q12H ordered to start on 11/03 @ 1200.   Height: 6\' 5"  (195.6 cm) Weight: 108 kg (238 lb 1.6 oz) IBW/kg (Calculated) : 89.1  Temp (24hrs), Avg:97.7 F (36.5 C), Min:97.7 F (36.5 C), Max:97.7 F (36.5 C)  Recent Labs  Lab 07/19/20 0602 07/19/20 1600 07/23/20 0343 07/25/20 1308 07/25/20 1533 07/25/20 2205  WBC  --  8.6 7.1 8.9  --   --   CREATININE 0.62  --  0.53* 0.71  --   --   LATICACIDVEN  --   --   --   --  1.1 1.4    Estimated Creatinine Clearance: 120.9 mL/min (by C-G formula based on SCr of 0.71 mg/dL).    No Known Allergies  Antimicrobials this admission:   >>    >>   Dose adjustments this admission:   Microbiology results:  BCx:   UCx:    Sputum:   MRSA PCR:   Thank you for allowing pharmacy to be a part of this patient's care.  Haaris Metallo D 07/25/2020 11:04 PM

## 2020-07-26 ENCOUNTER — Inpatient Hospital Stay (HOSPITAL_COMMUNITY)
Admit: 2020-07-26 | Discharge: 2020-07-26 | Disposition: A | Discharge status: Home / Self Care | Payer: BC Managed Care – PPO | Attending: Internal Medicine | Admitting: Internal Medicine

## 2020-07-26 DIAGNOSIS — I9589 Other hypotension: Secondary | ICD-10-CM | POA: Diagnosis not present

## 2020-07-26 LAB — HIV ANTIBODY (ROUTINE TESTING W REFLEX): HIV Screen 4th Generation wRfx: NONREACTIVE

## 2020-07-26 LAB — RETICULOCYTES
Immature Retic Fract: 28.6 % — ABNORMAL HIGH (ref 2.3–15.9)
RBC.: 2.89 MIL/uL — ABNORMAL LOW (ref 4.22–5.81)
Retic Count, Absolute: 98 10*3/uL (ref 19.0–186.0)
Retic Ct Pct: 3.4 % — ABNORMAL HIGH (ref 0.4–3.1)

## 2020-07-26 LAB — VITAMIN B12: Vitamin B-12: 1131 pg/mL — ABNORMAL HIGH (ref 180–914)

## 2020-07-26 LAB — T4, FREE: Free T4: 0.61 ng/dL (ref 0.61–1.12)

## 2020-07-26 LAB — ECHOCARDIOGRAM COMPLETE
AR max vel: 1.87 cm2
AV Area VTI: 2.27 cm2
AV Area mean vel: 1.9 cm2
AV Mean grad: 5 mmHg
AV Peak grad: 9 mmHg
Ao pk vel: 1.5 m/s
Area-P 1/2: 2.29 cm2
Height: 77 in
S' Lateral: 3.15 cm
Weight: 3809.55 oz

## 2020-07-26 LAB — TROPONIN I (HIGH SENSITIVITY)
Troponin I (High Sensitivity): 5 ng/L (ref <18)
Troponin I (High Sensitivity): 5 ng/L (ref <18)
Troponin I (High Sensitivity): 5 ng/L (ref <18)

## 2020-07-26 LAB — FERRITIN: Ferritin: 368 ng/mL — ABNORMAL HIGH (ref 24–336)

## 2020-07-26 LAB — MRSA PCR SCREENING: MRSA by PCR: NEGATIVE

## 2020-07-26 LAB — TSH: TSH: 0.841 u[IU]/mL (ref 0.350–4.500)

## 2020-07-26 LAB — IRON AND TIBC
Iron: 32 ug/dL — ABNORMAL LOW (ref 45–182)
Saturation Ratios: 13 % — ABNORMAL LOW (ref 17.9–39.5)
TIBC: 245 ug/dL — ABNORMAL LOW (ref 250–450)
UIBC: 213 ug/dL

## 2020-07-26 LAB — GLUCOSE, CAPILLARY: Glucose-Capillary: 170 mg/dL — ABNORMAL HIGH (ref 70–99)

## 2020-07-26 LAB — FOLATE: Folate: 15.7 ng/mL (ref >5.9)

## 2020-07-26 LAB — LACTIC ACID, PLASMA: Lactic Acid, Venous: 1.5 mmol/L (ref 0.5–1.9)

## 2020-07-26 MED ORDER — COLLAGENASE 250 UNIT/GM EX OINT
TOPICAL_OINTMENT | Freq: Every day | CUTANEOUS | Status: DC
  Administered 2020-07-27: 1 via TOPICAL
  Filled 2020-07-26: qty 30

## 2020-07-26 MED ORDER — CHLORHEXIDINE GLUCONATE CLOTH 2 % EX PADS
6.0000 | MEDICATED_PAD | Freq: Every day | CUTANEOUS | Status: DC
  Administered 2020-07-26 – 2020-08-08 (×14): 6 via TOPICAL
  Filled 2020-07-26: qty 6

## 2020-07-26 MED ORDER — ENOXAPARIN SODIUM 40 MG/0.4ML ~~LOC~~ SOLN
40.0000 mg | SUBCUTANEOUS | Status: DC
  Administered 2020-07-26 – 2020-08-07 (×13): 40 mg via SUBCUTANEOUS
  Filled 2020-07-26 (×13): qty 0.4

## 2020-07-26 MED ORDER — SODIUM CHLORIDE 0.9 % IV SOLN
3.0000 g | Freq: Four times a day (QID) | INTRAVENOUS | Status: AC
  Administered 2020-07-26: 1.5 g via INTRAVENOUS
  Administered 2020-07-26 – 2020-07-30 (×17): 3 g via INTRAVENOUS
  Filled 2020-07-26: qty 8
  Filled 2020-07-26 (×2): qty 3
  Filled 2020-07-26: qty 8
  Filled 2020-07-26 (×2): qty 3
  Filled 2020-07-26: qty 8
  Filled 2020-07-26 (×5): qty 3
  Filled 2020-07-26: qty 8
  Filled 2020-07-26: qty 3
  Filled 2020-07-26: qty 0.75
  Filled 2020-07-26: qty 3
  Filled 2020-07-26 (×3): qty 8

## 2020-07-26 NOTE — Consult Note (Addendum)
WOC Nurse Consult Note: Reason for Consult: Consult requested for sacrum; performed remotely after review of photos and progress notes in the EMR.  Pt is familiar to the [facility] team from previous admission; refer to progress notes on 10/19.  Last admission, he was noted to have Sacrum/bilat upper buttocks stage 3 pressure injuries with 2 areas which are separated by a narrow skin bridge. At this time, the narrow skin bridge is no longer present, and they have merged into one Unstageable pressure injury to the sacrum, 100% yellow slough; surrounded by pink dry scar tissue, mod amt tan drainage. Pressure Injury POA: Yes Dressing procedure/placement/frequency: Pt is on a low air loss mattress to reduce pressure. Topical treatment orders provided for bedside nurses to perform as follows to assist with removal of nonviable tissue: Apply Santyl to sacrum wound Q day, then cover with moist gauze and foam dressing.  (Change foam dressing Q 3 days or PRN soiling.) If aggressive plan of care is desired, please consult surgical team for possible bedside debridement of nonviable tissue.  Please re-consult if further assistance is needed.  Thank-you,  Julien Girt MSN, [place], [place], [place], [place]

## 2020-07-26 NOTE — TOC Progression Note (Signed)
Transition of Care [facility]) - Progression Note    Patient Details  Name: Django Nguyen MRN: [id number] Date of Birth: December 01, 1950  Transition of Care [facility]) CM/SW [place], [place] Phone Number:  [phone] 07/26/2020, 2:44 PM  Clinical Narrative:     CSW spoke with patient's spouse Zarin Knupp [phone], for update on patient disposition.  Ms. Roskelley wanted this CSW to contact [employer] about home health options for the patient.  Ms. Biglow stated in the past she had been told the patient's [place]BS [place] of [place] and will not cover home health, but she spoke with Alyse Low at [facility] and she was told the plan does cover home health.  CSW stated she would contact [place] and find out what they will cover.  CSW spoke with Ms. Leffler about hospice and palliative and although initially reluctant, Ms. Summerall is open to hear what options both offer for patient care.  CSW stated I would get in touch with Attending and request a palliative consult, if there wasn't one already and reach out to Santiago Glad at [facility]. Ms. Apodaca verbalized understanding.       Expected Discharge Plan and Services                                                 Social Determinants of Health (SDOH) Interventions    Readmission Risk Interventions No flowsheet data found.

## 2020-07-26 NOTE — Progress Notes (Signed)
*  PRELIMINARY RESULTS* Echocardiogram 2D Echocardiogram has been performed.  Sherrie Sport 07/26/2020, 9:54 AM

## 2020-07-26 NOTE — ED Notes (Signed)
Provider notified that pt became hypotensive upon the discontinuation of Levo. Notified that medication restarted.

## 2020-07-26 NOTE — Progress Notes (Signed)
PROGRESS NOTE    Ryan Day   ZWC:[id number]  DOB: 1951-03-27  PCP: Valerie Roys, DO    DOA: 07/25/2020 LOS: 1   Brief Narrative   HPI on admission: "Ryan Day is a 69 y.o. 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 After fall pt was managed at [facility] in [place] for 2 months  And has rehab at [facility]. Pt had  Trach and peg during [facility] stay . Pt is alert and awake and oriented x 3. Pt has been coughing and copious amount of sputum. "  Admitted to hospitalist service with aspiration pneumonia.     Assessment & Plan   Principal Problem:   Aspiration pneumonia ([place]) Active Problems:   Hypertension   Quadriplegia ([place])   Sacral decubitus ulcer, stage II (HCC)   Aspiration pneumonia - present on admission with SOB, copious sputum production, difficulty clearing secretions.  Chest xray consistent with aspiration PNA. --Continue Unasyn --Aspiration precautions --Diligent oral care --SLP evaluation --RT following --PRN Tylenol if fevers --PRN antitussives --scopolamine patch to dry up secretions  Hypotension - likely multifactorial due to sepsis/infection and hypovolemia, and replated to autonominc dysfunction with spinal cord injury.  Family report hypotension at home. --Pressors if needed to keep MAP>=65 --continue Florinef and midodrine --PCCM consulted  Iron deficiency anemia - monitor CBC.  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    Sacral decubitus ulcer, stage II  - POA.  WOC following.  Due to quadriplegic status and immobillity.  Reposition patient at least every 2 hours. Pressure Injury 07/26/20 Sacrum Unstageable - Full thickness tissue loss in which the base of the injury is covered by slough (yellow, tan, gray, green or brown) and/or eschar (tan, brown or black) in  the wound bed. (Active)  07/26/20 0513  Location: Sacrum  Location Orientation:   Staging: Unstageable - Full thickness tissue loss in which the base of the injury is covered by slough (yellow, tan, gray, green or brown) and/or eschar (tan, brown or black) in the wound bed.  Wound Description (Comments):   Present on Admission: Yes    Hx of Hypertension - not on antihypertensives, but on Florinef   DVT prophylaxis: enoxaparin (LOVENOX) injection 40 mg Start: 07/26/20 1000 SCDs Start: 07/25/20 2243   Diet:  Diet Orders (From admission, onward)    Start     Ordered   07/25/20 2244  Diet NPO time specified  Diet effective now        07/25/20 2244            Code Status: Full Code    Subjective 07/26/20    Pt seen in ICU this AM.  Reports feeling okay.  Still having a lot of secretions which he is able to cough up into his mouth for [place] suctioning.  Denies fever/chills.  Requests suction during encounter, assisted patient.  Denies n/v/d or other acute complaints at this time.   Disposition Plan & Communication   Status is: Inpatient  Inpatient remains appropriate status due to severity of illness requiring IV therapies as above.  Dispo: The patient is from: Home              Anticipated d/c is to: [facility]              Anticipated d/c date is: 2-3  days              Patient currently is not medically stable for d/c.  Family Communication: none at bedside, will attempt to call   Consults, Procedures, Significant Events   Consultants:   WOC  Procedures:   none  Antimicrobials:  Anti-infectives (From admission, onward)   Start     Dose/Rate Route Frequency Ordered Stop   07/26/20 1600  Ampicillin-Sulbactam (UNASYN) 3 g in sodium chloride 0.9 % 100 mL IVPB        3 g 200 mL/hr over 30 Minutes Intravenous Every 6 hours 07/26/20 1130     07/26/20 1200  vancomycin (VANCOCIN) IVPB 1000 mg/200 mL premix  Status:  Discontinued        1,000 mg 200 mL/hr over 60 Minutes  Intravenous Every 12 hours 07/25/20 2304 07/26/20 1106   07/25/20 2315  vancomycin (VANCOREADY) IVPB 2000 mg/400 mL       "Followed by" Linked Group Details   2,000 mg 200 mL/hr over 120 Minutes Intravenous  Once 07/25/20 2301 07/26/20 0205   07/25/20 2315  vancomycin (VANCOREADY) IVPB 500 mg/100 mL       "Followed by" Linked Group Details   500 mg 100 mL/hr over 60 Minutes Intravenous  Once 07/25/20 2301 07/26/20 0323   07/25/20 1500  piperacillin-tazobactam (ZOSYN) IVPB 3.375 g  Status:  Discontinued        3.375 g 12.5 mL/hr over 240 Minutes Intravenous Every 8 hours 07/25/20 1446 07/26/20 1129   07/25/20 1430  Ampicillin-Sulbactam (UNASYN) 3 g in sodium chloride 0.9 % 100 mL IVPB  Status:  Discontinued        3 g 200 mL/hr over 30 Minutes Intravenous  Once 07/25/20 1418 07/25/20 1446        Objective   Vitals:   07/26/20 1700 07/26/20 1900 07/26/20 2000 07/26/20 2037  BP: (!) 113/57 (!) 111/54 (!) 103/58   Pulse: 99 87 86   Resp: (!) 29 (!) 24 (!) 37   Temp: 98.5 F (36.9 C)  100.2 F (37.9 C)   TempSrc: Axillary  Oral   SpO2: 92% 95% 94% 93%  Weight:      Height:        Intake/Output Summary (Last 24 hours) at 07/26/2020 2111 Last data filed at 07/26/2020 2000 Gross per 24 hour  Intake 3152.61 ml  Output 2050 ml  Net 1102.61 ml   Filed Weights   07/25/20 1300  Weight: 108 kg    Physical Exam:  General exam: awake, alert, no acute distress HEENT: trach in place, hearing grossly normal  Respiratory system: decreased breath sounds, no wheezes, normal respiratory effort. Cardiovascular system: normal S1/S2, RRR, no pedal edema.   Gastrointestinal system: soft, NT, ND, +bowel sounds. Central nervous system: A&O x 3 no gross focal neurologic deficits, normal speech Psychiatry: normal mood, congruent affect, judgement and insight appear normal  Labs   Data Reviewed: I have personally reviewed following labs and imaging studies  CBC: Recent Labs  Lab  07/23/20 0343 07/25/20 1308  WBC 7.1 8.9  NEUTROABS  --  5.9  HGB 8.2* 8.6*  HCT 25.4* 27.1*  MCV 87.9 88.6  PLT 378 914*   Basic Metabolic Panel: Recent Labs  Lab 07/23/20 0343 07/25/20 1308  NA 137 141  K 3.6 4.1  CL 100 104  CO2 28 28  GLUCOSE 103* 72  BUN 21 26*  CREATININE 0.53* 0.71  CALCIUM 9.3 9.7   GFR: Estimated Creatinine Clearance:  120.9 mL/min (by C-G formula based on SCr of 0.71 mg/dL). Liver Function Tests: Recent Labs  Lab 07/23/20 0343 07/25/20 1308  AST 47* 47*  ALT 69* 64*  ALKPHOS 164* 162*  BILITOT 0.5 0.7  PROT 7.4 8.2*  ALBUMIN 2.0* 2.3*   No results for input(s): LIPASE, AMYLASE in the last 168 hours. No results for input(s): AMMONIA in the last 168 hours. Coagulation Profile: No results for input(s): INR, PROTIME in the last 168 hours. Cardiac Enzymes: No results for input(s): CKTOTAL, CKMB, CKMBINDEX, TROPONINI in the last 168 hours. BNP (last 3 results) No results for input(s): PROBNP in the last 8760 hours. HbA1C: No results for input(s): HGBA1C in the last 72 hours. CBG: Recent Labs  Lab 07/20/20 0757 07/26/20 0421  GLUCAP 84 170*   Lipid Profile: No results for input(s): CHOL, HDL, LDLCALC, TRIG, CHOLHDL, LDLDIRECT in the last 72 hours. Thyroid Function Tests: Recent Labs    07/26/20 0058  TSH 0.841  FREET4 0.61   Anemia Panel: Recent Labs    07/26/20 0058  VITAMINB12 1,131*  FOLATE 15.7  FERRITIN 368*  TIBC 245*  IRON 32*  RETICCTPCT 3.4*   Sepsis Labs: Recent Labs  Lab 07/25/20 1319 07/25/20 1533 07/25/20 2205 07/26/20 0318  PROCALCITON <0.10  --   --   --   LATICACIDVEN  --  1.1 1.4 1.5    Recent Results (from the past 240 hour(s))  Culture, Urine     Status: Abnormal   Collection Time: 07/19/20  7:35 PM   Specimen: Urine, Random  Result Value Ref Range Status   Specimen Description URINE, RANDOM  Final   Special Requests   Final    NONE Performed at [facility], [place] [address]  [place], [place] [address]    Culture >=100,000 COLONIES/mL PSEUDOMONAS AERUGINOSA (A)  Final   Report Status 07/22/2020 FINAL  Final   Organism ID, Bacteria PSEUDOMONAS AERUGINOSA (A)  Final      Susceptibility   Pseudomonas aeruginosa - MIC*    CEFTAZIDIME 4 SENSITIVE Sensitive     CIPROFLOXACIN <=0.25 SENSITIVE Sensitive     GENTAMICIN 2 SENSITIVE Sensitive     IMIPENEM 2 SENSITIVE Sensitive     PIP/TAZO 16 SENSITIVE Sensitive     CEFEPIME 4 SENSITIVE Sensitive     * >=100,000 COLONIES/mL PSEUDOMONAS AERUGINOSA  Respiratory Panel by RT PCR (Flu A&B, Covid) - Nasopharyngeal Swab     Status: None   Collection Time: 07/23/20  3:23 AM   Specimen: Nasopharyngeal Swab  Result Value Ref Range Status   SARS Coronavirus 2 by RT PCR NEGATIVE NEGATIVE Final    Comment: (NOTE) SARS-CoV-2 target nucleic acids are NOT DETECTED.  The SARS-CoV-2 RNA is generally detectable in upper respiratoy specimens during the acute phase of infection. The lowest concentration of SARS-CoV-2 viral copies this assay can detect is 131 copies/mL. A negative result does not preclude SARS-Cov-2 infection and should not be used as the sole basis for treatment or other patient management decisions. A negative result may occur with  improper specimen collection/handling, submission of specimen other than nasopharyngeal swab, presence of viral mutation(s) within the areas targeted by this assay, and inadequate number of viral copies (<131 copies/mL). A negative result must be combined with clinical observations, patient history, and epidemiological information. The expected result is Negative.  Fact Sheet for Patients:  PinkCheek.be  Fact Sheet for Healthcare Providers:  GravelBags.it  This test is no t yet approved or cleared by the  [place] Architectural technologist and  has been authorized for detection and/or diagnosis of SARS-CoV-2 by FDA under an Patent examiner (EUA). This EUA will remain  in effect (meaning this test can be used) for the duration of the COVID-19 declaration under Section 564(b)(1) of the Act, 21 U.S.C. section 360bbb-3(b)(1), unless the authorization is terminated or revoked sooner.     Influenza A by PCR NEGATIVE NEGATIVE Final   Influenza B by PCR NEGATIVE NEGATIVE Final    Comment: (NOTE) The Xpert Xpress SARS-CoV-2/FLU/RSV assay is intended as an aid in  the diagnosis of influenza from Nasopharyngeal swab specimens and  should not be used as a sole basis for treatment. Nasal washings and  aspirates are unacceptable for Xpert Xpress SARS-CoV-2/FLU/RSV  testing.  Fact Sheet for Patients: PinkCheek.be  Fact Sheet for Healthcare Providers: GravelBags.it  This test is not yet approved or cleared by the [place] FDA and  has been authorized for detection and/or diagnosis of SARS-CoV-2 by  FDA under an Emergency Use Authorization (EUA). This EUA will remain  in effect (meaning this test can be used) for the duration of the  Covid-19 declaration under Section 564(b)(1) of the Act, 21  U.S.C. section 360bbb-3(b)(1), unless the authorization is  terminated or revoked. Performed at [facility], [address] [place], [place] [address]   Respiratory Panel by RT PCR (Flu A&B, Covid) - Nasopharyngeal Swab     Status: None   Collection Time: 07/25/20  2:11 PM   Specimen: Nasopharyngeal Swab  Result Value Ref Range Status   SARS Coronavirus 2 by RT PCR NEGATIVE NEGATIVE Final    Comment: (NOTE) SARS-CoV-2 target nucleic acids are NOT DETECTED.  The SARS-CoV-2 RNA is generally detectable in upper respiratoy specimens during the acute phase of infection. The lowest concentration of SARS-CoV-2 viral copies this assay can detect is 131 copies/mL. A negative result does not preclude SARS-Cov-2 infection and should not be used as the sole  basis for treatment or other patient management decisions. A negative result may occur with  improper specimen collection/handling, submission of specimen other than nasopharyngeal swab, presence of viral mutation(s) within the areas targeted by this assay, and inadequate number of viral copies (<131 copies/mL). A negative result must be combined with clinical observations, patient history, and epidemiological information. The expected result is Negative.  Fact Sheet for Patients:  PinkCheek.be  Fact Sheet for Healthcare Providers:  GravelBags.it  This test is no t yet approved or cleared by the [place] FDA and  has been authorized for detection and/or diagnosis of SARS-CoV-2 by FDA under an Emergency Use Authorization (EUA). This EUA will remain  in effect (meaning this test can be used) for the duration of the COVID-19 declaration under Section 564(b)(1) of the Act, 21 U.S.C. section 360bbb-3(b)(1), unless the authorization is terminated or revoked sooner.     Influenza A by PCR NEGATIVE NEGATIVE Final   Influenza B by PCR NEGATIVE NEGATIVE Final    Comment: (NOTE) The Xpert Xpress SARS-CoV-2/FLU/RSV assay is intended as an aid in  the diagnosis of influenza from Nasopharyngeal swab specimens and  should not be used as a sole basis for treatment. Nasal washings and  aspirates are unacceptable for Xpert Xpress SARS-CoV-2/FLU/RSV  testing.  Fact Sheet for Patients: PinkCheek.be  Fact Sheet for Healthcare Providers: GravelBags.it  This test is not yet approved or cleared by the [place] FDA and  has been authorized for detection and/or diagnosis of SARS-CoV-2 by  FDA  under an Emergency Use Authorization (EUA). This EUA will remain  in effect (meaning this test can be used) for the duration of the  Covid-19 declaration under Section 564(b)(1) of the  Act, 21  U.S.C. section 360bbb-3(b)(1), unless the authorization is  terminated or revoked. Performed at [facility], [address] [place], [place] [address]   Blood culture (routine x 2)     Status: None (Preliminary result)   Collection Time: 07/25/20  3:34 PM   Specimen: BLOOD  Result Value Ref Range Status   Specimen Description BLOOD BLOOD RIGHT FOREARM  Final   Special Requests   Final    BOTTLES DRAWN AEROBIC AND ANAEROBIC Blood Culture adequate volume   Culture   Final    NO GROWTH < 24 HOURS Performed at [facility], [address], [place], [place] [address]    Report Status PENDING  Incomplete  Blood culture (routine x 2)     Status: None (Preliminary result)   Collection Time: 07/25/20  3:34 PM   Specimen: BLOOD  Result Value Ref Range Status   Specimen Description BLOOD BLOOD LEFT FOREARM  Final   Special Requests   Final    BOTTLES DRAWN AEROBIC AND ANAEROBIC Blood Culture adequate volume   Culture   Final    NO GROWTH < 24 HOURS Performed at [facility], [address] [place], [place] [address]    Report Status PENDING  Incomplete  Culture, respiratory (non-expectorated)     Status: None (Preliminary result)   Collection Time: 07/25/20  6:36 PM   Specimen: Tracheal Aspirate; Respiratory  Result Value Ref Range Status   Specimen Description   Final    TRACHEAL ASPIRATE Performed at [facility], [address] [place], [place] [address]    Special Requests   Final    NONE Performed at [place] [place], [place] [address]    Gram Stain   Final    MODERATE WBC PRESENT,BOTH PMN AND MONONUCLEAR FEW GRAM POSITIVE COCCI IN CHAINS FEW GRAM POSITIVE RODS RARE GRAM NEGATIVE RODS    Culture   Final    TOO YOUNG TO READ Performed at [facility], [place] [address] [place], [place] [address]    Report Status PENDING  Incomplete  MRSA PCR Screening     Status: None   Collection  Time: 07/26/20  4:51 AM   Specimen: Nasopharyngeal  Result Value Ref Range Status   MRSA by PCR NEGATIVE NEGATIVE Final    Comment:        The GeneXpert MRSA Assay (FDA approved for NASAL specimens only), is one component of a comprehensive MRSA colonization surveillance program. It is not intended to diagnose MRSA infection nor to guide or monitor treatment for MRSA infections. Performed at [facility], [address] [place],  [address]       Imaging Studies   DG Chest 2 View  Result Date: 07/25/2020 CLINICAL DATA:  Shortness of breath. EXAM: CHEST - 2 VIEW COMPARISON:  07/23/2020 FINDINGS: Cardiomediastinal silhouette is similar to prior. Bibasilar opacities, best appreciated on the lateral radiograph. No visible pleural effusions or pneumothorax. Tracheostomy tube is midline with the tip at the level of the inferior clavicular heads. IMPRESSION: Bibasilar airspace opacities, concerning for aspiration and/or pneumonia. Electronically Signed   By: Margaretha Sheffield MD   On: 07/25/2020 13:59   ECHOCARDIOGRAM COMPLETE  Result Date: 07/26/2020    ECHOCARDIOGRAM REPORT   Patient  Name:   SIMCHA SPEIR Date of Exam: 07/26/2020 Medical Rec #:  [id number]   Height:       77.0 in Accession #:    [id number]  Weight:       238.1 lb Date of Birth:  Jul 03, 1951    BSA:          2.409 m Patient Age:    44 years    BP:           105/67 mmHg Patient Gender: M           HR:           72 bpm. Exam Location:  [facility] Procedure: 2D Echo, Cardiac Doppler and Color Doppler Indications:     Hypotension  History:         Patient has no prior history of Echocardiogram examinations.                  Risk Factors:Hypertension and Dyslipidemia.  Sonographer:     Sherrie Sport RDCS (AE) Referring Phys:  [id number] Para Skeans Diagnosing Phys: Kate Sable MD  Sonographer Comments: Suboptimal apical window and no subcostal window. IMPRESSIONS  1. Left ventricular ejection fraction, by estimation, is 55 to  60%. The left ventricle has normal function. The left ventricle has no regional wall motion abnormalities. Left ventricular diastolic parameters are consistent with Grade I diastolic dysfunction (impaired relaxation).  2. Right ventricular systolic function is normal. The right ventricular size is normal.  3. The mitral valve is normal in structure. No evidence of mitral valve regurgitation. No evidence of mitral stenosis.  4. The aortic valve is normal in structure. Aortic valve regurgitation is not visualized. No aortic stenosis is present. FINDINGS  Left Ventricle: Left ventricular ejection fraction, by estimation, is 55 to 60%. The left ventricle has normal function. The left ventricle has no regional wall motion abnormalities. The left ventricular internal cavity size was normal in size. There is  no left ventricular hypertrophy. Left ventricular diastolic parameters are consistent with Grade I diastolic dysfunction (impaired relaxation). Right Ventricle: The right ventricular size is normal. No increase in right ventricular wall thickness. Right ventricular systolic function is normal. Left Atrium: Left atrial size was normal in size. Right Atrium: Right atrial size was normal in size. Pericardium: There is no evidence of pericardial effusion. Mitral Valve: The mitral valve is normal in structure. No evidence of mitral valve regurgitation. No evidence of mitral valve stenosis. Tricuspid Valve: The tricuspid valve is normal in structure. Tricuspid valve regurgitation is not demonstrated. No evidence of tricuspid stenosis. Aortic Valve: The aortic valve is normal in structure. Aortic valve regurgitation is not visualized. No aortic stenosis is present. Aortic valve mean gradient measures 5.0 mmHg. Aortic valve peak gradient measures 9.0 mmHg. Aortic valve area, by VTI measures 2.27 cm. Pulmonic Valve: The pulmonic valve was normal in structure. Pulmonic valve regurgitation is not visualized. No evidence of  pulmonic stenosis. Aorta: The aortic root is normal in size and structure. Venous: The inferior vena cava was not well visualized. IAS/Shunts: No atrial level shunt detected by color flow Doppler.  LEFT VENTRICLE PLAX 2D LVIDd:         4.92 cm  Diastology LVIDs:         3.15 cm  LV e' medial:    5.66 cm/s LV PW:         1.35 cm  LV E/e' medial:  13.6 LV IVS:        1.26  cm  LV e' lateral:   13.80 cm/s LVOT diam:     2.00 cm  LV E/e' lateral: 5.6 LV SV:         63 LV SV Index:   26 LVOT Area:     3.14 cm  RIGHT VENTRICLE RV Basal diam:  3.38 cm RV S prime:     15.40 cm/s TAPSE (M-mode): 3.6 cm LEFT ATRIUM           Index       RIGHT ATRIUM           Index LA diam:      2.70 cm 1.12 cm/m  RA Area:     17.80 cm LA Vol (A4C): 48.0 ml 19.93 ml/m RA Volume:   44.20 ml  18.35 ml/m  AORTIC VALVE                   PULMONIC VALVE AV Area (Vmax):    1.87 cm    PV Vmax:        1.11 m/s AV Area (Vmean):   1.90 cm    PV Peak grad:   4.9 mmHg AV Area (VTI):     2.27 cm    RVOT Peak grad: 5 mmHg AV Vmax:           150.00 cm/s AV Vmean:          97.750 cm/s AV VTI:            0.278 m AV Peak Grad:      9.0 mmHg AV Mean Grad:      5.0 mmHg LVOT Vmax:         89.40 cm/s LVOT Vmean:        59.000 cm/s LVOT VTI:          0.201 m LVOT/AV VTI ratio: 0.72  AORTA Ao Root diam: 3.10 cm MITRAL VALVE                TRICUSPID VALVE MV Area (PHT): 2.29 cm     TR Peak grad:   40.7 mmHg MV Decel Time: 332 msec     TR Vmax:        319.00 cm/s MV E velocity: 77.10 cm/s MV A velocity: 107.00 cm/s  SHUNTS MV E/A ratio:  0.72         Systemic VTI:  0.20 m                             Systemic Diam: 2.00 cm Kate Sable MD Electronically signed by Kate Sable MD Signature Date/Time: 07/26/2020/12:52:51 PM    Final      Medications   Scheduled Meds: . ascorbic acid  500 mg Per Tube BID  . baclofen  5 mg Per Tube TID  . bisacodyl  10 mg Rectal QHS  . chlorhexidine  15 mL Mouth Rinse BID  . Chlorhexidine Gluconate Cloth  6 each  Topical Daily  . collagenase   Topical Daily  . enoxaparin (LOVENOX) injection  40 mg Subcutaneous Q24H  . famotidine  20 mg Per Tube Daily  . feeding supplement (OSMOLITE 1.5 CAL)  474 mL Per Tube QID  . feeding supplement (PROSource TF)  45 mL Per Tube BID  . ferrous sulfate  75 mg Per Tube Daily  . fludrocortisone  0.2 mg Per Tube Daily  . FLUoxetine  40 mg Per Tube Daily  . guaiFENesin  20 mL  Per Tube Q6H  . mouth rinse  15 mL Mouth Rinse q12n4p  . midodrine  10 mg Per Tube TID WC  . multivitamin  5 mL Per Tube Daily  . nutrition supplement (JUVEN)  1 packet Per Tube BID BM  . polycarbophil  625 mg Per Tube Daily  . saccharomyces boulardii  250 mg Per Tube BID  . [START ON 07/28/2020] scopolamine  1 patch Transdermal Q72H  . simethicone  80 mg Per Tube QID  . traZODone  50 mg Per Tube QHS   Continuous Infusions: . sodium chloride 75 mL/hr at 07/26/20 2000  . sodium chloride Stopped (07/25/20 2358)  . sodium chloride Stopped (07/25/20 2347)  . ampicillin-sulbactam (UNASYN) IV 3 g (07/26/20 1551)  . norepinephrine (LEVOPHED) Adult infusion 1 mcg/min (07/26/20 1413)       LOS: 1 day    Time spent: 30 minutes    Ezekiel Slocumb, DO [facility]  07/26/2020, 9:11 PM    If 7PM-7AM, please contact night-coverage. How to contact the [facility] Attending or Consulting provider [place] or covering provider during after hours [place], for this patient?    1. Check the care team in [facility] and look for a) attending/consulting [facility] provider listed and b) the [facility] team listed 2. Log into [email or web address] and use [place]'s universal password to access. If you do not have the password, please contact the hospital operator. 3. Locate the [facility] provider you are looking for under [facility] and page to a number that you can be directly reached. 4. If you still have difficulty reaching the provider, please page the [facility] (Director on Call) for the Hospitalists listed on amion for  assistance.

## 2020-07-26 NOTE — Hospital Course (Signed)
HPI on admission: "Ryan Day is a 69 y.o. 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 After fall pt was managed at [facility] in [place] for 2 months  And has rehab at [facility]. Pt had  Trach and peg during [facility] stay . Pt is alert and awake and oriented x 3. Pt has been coughing and copious amount of sputum. "  Admitted to hospitalist service with aspiration pneumonia.

## 2020-07-26 NOTE — ED Notes (Signed)
Hold norepinephrine per [facility] NP at this time.

## 2020-07-27 DIAGNOSIS — Z7189 Other specified counseling: Secondary | ICD-10-CM

## 2020-07-27 DIAGNOSIS — Z515 Encounter for palliative care: Secondary | ICD-10-CM

## 2020-07-27 DIAGNOSIS — L89152 Pressure ulcer of sacral region, stage 2: Secondary | ICD-10-CM

## 2020-07-27 LAB — CBC WITH DIFFERENTIAL/PLATELET
Abs Immature Granulocytes: 0.04 10*3/uL (ref 0.00–0.07)
Basophils Absolute: 0 10*3/uL (ref 0.0–0.1)
Basophils Relative: 0 %
Eosinophils Absolute: 0.2 10*3/uL (ref 0.0–0.5)
Eosinophils Relative: 3 %
HCT: 25.6 % — ABNORMAL LOW (ref 39.0–52.0)
Hemoglobin: 7.9 g/dL — ABNORMAL LOW (ref 13.0–17.0)
Immature Granulocytes: 1 %
Lymphocytes Relative: 12 %
Lymphs Abs: 1 10*3/uL (ref 0.7–4.0)
MCH: 27.5 pg (ref 26.0–34.0)
MCHC: 30.9 g/dL (ref 30.0–36.0)
MCV: 89.2 fL (ref 80.0–100.0)
Monocytes Absolute: 1.4 10*3/uL — ABNORMAL HIGH (ref 0.1–1.0)
Monocytes Relative: 17 %
Neutro Abs: 5.8 10*3/uL (ref 1.7–7.7)
Neutrophils Relative %: 67 %
Platelets: 321 10*3/uL (ref 150–400)
RBC: 2.87 MIL/uL — ABNORMAL LOW (ref 4.22–5.81)
RDW: 17.3 % — ABNORMAL HIGH (ref 11.5–15.5)
WBC: 8.5 10*3/uL (ref 4.0–10.5)
nRBC: 0.2 % (ref 0.0–0.2)

## 2020-07-27 LAB — BASIC METABOLIC PANEL
Anion gap: 9 (ref 5–15)
BUN: 26 mg/dL — ABNORMAL HIGH (ref 8–23)
CO2: 27 mmol/L (ref 22–32)
Calcium: 9.3 mg/dL (ref 8.9–10.3)
Chloride: 112 mmol/L — ABNORMAL HIGH (ref 98–111)
Creatinine, Ser: 0.76 mg/dL (ref 0.61–1.24)
GFR, Estimated: >60 mL/min (ref >60)
Glucose, Bld: 102 mg/dL — ABNORMAL HIGH (ref 70–99)
Potassium: 3.8 mmol/L (ref 3.5–5.1)
Sodium: 148 mmol/L — ABNORMAL HIGH (ref 135–145)

## 2020-07-27 MED ORDER — HYDROCORTISONE 1 % EX CREA
TOPICAL_CREAM | Freq: Three times a day (TID) | CUTANEOUS | Status: DC | PRN
  Filled 2020-07-27 (×2): qty 28

## 2020-07-27 MED ORDER — FREE WATER
300.0000 mL | Status: DC
  Administered 2020-07-27 – 2020-07-28 (×5): 300 mL

## 2020-07-27 MED ORDER — DEXTROSE-NACL 5-0.45 % IV SOLN: INTRAVENOUS | Status: DC

## 2020-07-27 NOTE — TOC Progression Note (Addendum)
Transition of Care [facility]) - Progression Note    Patient Details  Name: Ryan Day MRN: [id number] Date of Birth: Jun 05, 1951  Transition of Care [facility]) CM/SW [place], [place] Phone Number: [phone] 07/27/2020, 11:04 AM  Clinical Narrative:     CSW spoke with Alyse Low at [facility] [phone], to discuss patient's home health eligibility.  Alyse Low stated the information she has been given from the home health agencies in reference patient care is due to not having enougn staff available to meet the patient's needs. Alyse Low stated she wanted to introduce the idea of palliative care to the patient's wife [name].  CSW let her know hat [name] and I had spoken about that yesterday and Dr. Arbutus Ped has made a palliative care consult.  CSW also mentioned hospice and Alyse Low stated the patient's insurance currently covers 70% of hospice care, but the patient only has Medicare part A, and she will contact [name] to apply for Medicare part B, since the enrollment period begins soon.  Alyse Low also recommended [name] apply for  Medicaid to assist with expenses and to increase the amount of resources the patient is eligible for.  Alyse Low stated she would reach out to the [employer] on [name] case and request she contact [name] about Medicaid application.  Expected Discharge Plan and Services                                                 Social Determinants of Health (SDOH) Interventions    Readmission Risk Interventions No flowsheet data found.

## 2020-07-27 NOTE — Progress Notes (Signed)
Report given to [facility] RN, patient tx to room 215. Called wife to notify her of patient's tx.

## 2020-07-27 NOTE — Progress Notes (Signed)
Sx. For large amt. Of thick white secretions. Pt. Tolerated all well.

## 2020-07-27 NOTE — Progress Notes (Signed)
SLP Cancellation Note  Patient Details Name: Ryan Day MRN: [id number] DOB: 07/17/1951   Cancelled treatment:       Reason Eval/Treat Not Completed: Medical issues which prohibited therapy;Patient not medically ready (chart reviewed; consulted NSG and Palliative Care re: pt). Per extensive review of pt's chart notes, pt received a tracheostomy/PEG placement at [facility] on 05/04/2020 s/p admit on 04/26/2020 after Fall, striking his head and face with subsequent qualdraplegia. He was found to have C5 vertebral body extension type teardrop fracture, C3-C6 cord compression with edema and was taken to OR for C3-C6 laminectomy with fusion and repair of small dural tear X 2 by Dr. Macario Carls on the same day. Post op course significant for spinal shock with hypotension and bradycardia, post procedural PTX,  C-diff colitis, significant secretions requiring multiple bronchoscopies due to mucous plugging and ultimately required PEG/Trach on 05/04/20. Hospital course further complicated by respiratory distress with PEA and and ROSC achieved after 8 minutes. Aspiration PNA and intermittent fevers treated. Pt's trach is currently capped allowing pt to verbally communicate w/ others.  Pt has a 3 total MBSSs to objectively assess his swallow function per Imaging notes: 06/27/2020, 07/07/2020, 07/12/2020; while at [facility] 06/07/2020, and then at [facility] 10/1-10/28. 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 This swallowing presentation can certainly indicate risk for aspiration thus Pulmonary impact and decline.  Pt currently has reliance on tube feeds and free water per PEG tube to meet calorie/protein/hydration needs and is being monitored for any gastric distention.  At this Acute time of illness and pt's medical presentation, assessment of swallow function for initiation of oral diet is not recommended(especially in light of  pt's frequent/recent MBSS results). Instead, recommend pt f/u w/ continued dysphagia therapy as [facility] in [facility] in order to strengthen oropharyngeal swallowing musculature to promote swallowing. Recommend frequent oral care for hygiene and stimulation of swallowing. Recommend Home Health or Outpt or SNF f/u of skilled ST services for Dysphagia at discharge. A future objective swallowing assessment(MBSS) should be considered in the next 3-4 weeks when pt is recovered from this hospitalization and has had time to engage in therapy.  Patient is admitted with aspiration PNA, hypotension this admit per chart notes. NSG/MD and Palliative Care updated.        Orinda Kenner, MS, CCC-SLP Speech Language Pathologist Rehab Services [phone] [facility] 07/27/2020, 3:16 PM

## 2020-07-27 NOTE — Progress Notes (Addendum)
PROGRESS NOTE    Ryan Day   ZOX:[id number]  DOB: 10/31/1950  PCP: Valerie Roys, DO    DOA: 07/25/2020 LOS: 2   Brief Narrative   HPI on admission: "Ryan Day is a 69 y.o. 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 After fall pt was managed at [facility] in [place] for 2 months  And has rehab at [facility]. Pt had  Trach and peg during [place] stay . Pt is alert and awake and oriented x 3. Pt has been coughing and copious amount of sputum. "  Admitted to hospitalist service with aspiration pneumonia.     Assessment & Plan   Principal Problem:   Aspiration pneumonia ([place]) Active Problems:   Hypertension   Quadriplegia ([place])   Sacral decubitus ulcer, stage II (HCC)   Aspiration pneumonia - present on admission with SOB, copious sputum production, difficulty clearing secretions.  Chest xray consistent with aspiration PNA. 11/4 - continues to have copious thick secretions, but none coming out or around trach. --Continue Unasyn --Aspiration precautions --Diligent oral care --SLP evaluation --RT following --PRN Tylenol if fevers --PRN antitussives --scopolamine patch to dry up secretions --continue close monitoring in stepdown  Hypernatremia - Sodium 148 on AM labs 11/4.  Did not have free water flushes ordered with tube feeds - these have been resumed.  Also on D5-1/2NS @ 75 cc/hr, will continue for today and stop tomorrow if improved.  Hypotension - likely multifactorial due to sepsis/infection and hypovolemia, and replated to autonominc dysfunction with spinal cord injury.  Family report hypotension at home. --Pressors if needed to keep MAP>=65 --continue Florinef and midodrine --PCCM consulted  Iron deficiency anemia - monitor CBC.  Transfuse for Hbg < 7.0.  Continue iron supplement.   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   --Tube feeds with free water flushes per orders. --Routine trach care --Continue baclofen  Sacral decubitus ulcer, stage II  - POA.  WOC following.  Due to quadriplegic status and immobillity.  Reposition patient at least every 2 hours. Pressure Injury 07/26/20 Sacrum Unstageable - Full thickness tissue loss in which the base of the injury is covered by slough (yellow, tan, gray, green or brown) and/or eschar (tan, brown or black) in the wound bed. (Active)  07/26/20 0513  Location: Sacrum  Location Orientation:   Staging: Unstageable - Full thickness tissue loss in which the base of the injury is covered by slough (yellow, tan, gray, green or brown) and/or eschar (tan, brown or black) in the wound bed.  Wound Description (Comments):   Present on Admission: Yes    Hx of Hypertension - not on antihypertensives, but on Florinef  Hx of depression - continue Prozac   DVT prophylaxis: enoxaparin (LOVENOX) injection 40 mg Start: 07/26/20 1000 SCDs Start: 07/25/20 2243   Diet:  Diet Orders (From admission, onward)    Start     Ordered   07/25/20 2244  Diet NPO time specified  Diet effective now        07/25/20 2244            Code Status: Full Code    Subjective 07/27/20    Pt seen in ICU this AM.  Wife at bedside.  Patient states feeling a little better today, but still having thick secretions.  Assisted with oral suction during encounter.  No fever/chills.  Wife reports that, at home, he was having secretions come out and around the trach prior to admission, this has improved.     Disposition Plan & Communication   Status is: Inpatient  Inpatient remains appropriate status due to severity of illness requiring IV therapies as above.  Dispo: The patient is from: Home              Anticipated d/c is to: Home              Anticipated d/c date is: 2-3 days              Patient currently is not medically stable for d/c.  Family Communication: wife Ryan Day  at bedside during encounter this AM.   Consults, Procedures, Significant Events   Consultants:   WOC  Procedures:   none  Antimicrobials:  Anti-infectives (From admission, onward)   Start     Dose/Rate Route Frequency Ordered Stop   07/26/20 1600  Ampicillin-Sulbactam (UNASYN) 3 g in sodium chloride 0.9 % 100 mL IVPB        3 g 200 mL/hr over 30 Minutes Intravenous Every 6 hours 07/26/20 1130     07/26/20 1200  vancomycin (VANCOCIN) IVPB 1000 mg/200 mL premix  Status:  Discontinued        1,000 mg 200 mL/hr over 60 Minutes Intravenous Every 12 hours 07/25/20 2304 07/26/20 1106   07/25/20 2315  vancomycin (VANCOREADY) IVPB 2000 mg/400 mL       "Followed by" Linked Group Details   2,000 mg 200 mL/hr over 120 Minutes Intravenous  Once 07/25/20 2301 07/26/20 0205   07/25/20 2315  vancomycin (VANCOREADY) IVPB 500 mg/100 mL       "Followed by" Linked Group Details   500 mg 100 mL/hr over 60 Minutes Intravenous  Once 07/25/20 2301 07/26/20 0323   07/25/20 1500  piperacillin-tazobactam (ZOSYN) IVPB 3.375 g  Status:  Discontinued        3.375 g 12.5 mL/hr over 240 Minutes Intravenous Every 8 hours 07/25/20 1446 07/26/20 1129   07/25/20 1430  Ampicillin-Sulbactam (UNASYN) 3 g in sodium chloride 0.9 % 100 mL IVPB  Status:  Discontinued        3 g 200 mL/hr over 30 Minutes Intravenous  Once 07/25/20 1418 07/25/20 1446        Objective   Vitals:   07/27/20 0600 07/27/20 0800 07/27/20 0900 07/27/20 1000  BP: 116/62 (!) 86/56 (!) 88/56 99/60  Pulse: 63 69 68 67  Resp: (!) 24 (!) 28 (!) 25 (!) 24  Temp:   98.7 F (37.1 C)   TempSrc:   Axillary   SpO2: 96% 92% 94% 91%  Weight:      Height:        Intake/Output Summary (Last 24 hours) at 07/27/2020 1503 Last data filed at 07/27/2020 0904 Gross per 24 hour  Intake 1358.67 ml  Output 1850 ml  Net -491.33 ml   Filed Weights   07/25/20 1300  Weight: 108 kg    Physical Exam:  General exam: awake, alert, no acute  distress HEENT: trach in place capped, on room air, hearing grossly normal  Respiratory system: referred upper airway secretion sounds, no wheezes, normal respiratory effort. Cardiovascular system: normal S1/S2, RRR, no pedal edema.   Gastrointestinal system: soft, NT, ND, PEG tube in place Psychiatry: normal mood, congruent affect, judgement and insight appear normal   Labs   Data Reviewed: I have personally reviewed following labs and imaging studies  CBC: Recent Labs  Lab 07/23/20 0343 07/25/20 1308 07/27/20 0726  WBC 7.1 8.9 8.5  NEUTROABS  --  5.9 5.8  HGB 8.2* 8.6* 7.9*  HCT 25.4* 27.1* 25.6*  MCV 87.9 88.6 89.2  PLT 378 421* 122   Basic Metabolic Panel: Recent Labs  Lab 07/23/20 0343 07/25/20 1308 07/27/20 0726  NA 137 141 148*  K 3.6 4.1 3.8  CL 100 104 112*  CO2 28 28 27   GLUCOSE 103* 72 102*  BUN 21 26* 26*  CREATININE 0.53* 0.71 0.76  CALCIUM 9.3 9.7 9.3   GFR: Estimated Creatinine Clearance: 120.9 mL/min (by C-G formula based on SCr of 0.76 mg/dL). Liver Function Tests: Recent Labs  Lab 07/23/20 0343 07/25/20 1308  AST 47* 47*  ALT 69* 64*  ALKPHOS 164* 162*  BILITOT 0.5 0.7  PROT 7.4 8.2*  ALBUMIN 2.0* 2.3*   No results for input(s): LIPASE, AMYLASE in the last 168 hours. No results for input(s): AMMONIA in the last 168 hours. Coagulation Profile: No results for input(s): INR, PROTIME in the last 168 hours. Cardiac Enzymes: No results for input(s): CKTOTAL, CKMB, CKMBINDEX, TROPONINI in the last 168 hours. BNP (last 3 results) No results for input(s): PROBNP in the last 8760 hours. HbA1C: No results for input(s): HGBA1C in the last 72 hours. CBG: Recent Labs  Lab 07/26/20 0421  GLUCAP 170*   Lipid Profile: No results for input(s): CHOL, HDL, LDLCALC, TRIG, CHOLHDL, LDLDIRECT in the last 72 hours. Thyroid Function Tests: Recent Labs    07/26/20 0058  TSH 0.841  FREET4 0.61   Anemia Panel: Recent Labs    07/26/20 0058   VITAMINB12 1,131*  FOLATE 15.7  FERRITIN 368*  TIBC 245*  IRON 32*  RETICCTPCT 3.4*   Sepsis Labs: Recent Labs  Lab 07/25/20 1319 07/25/20 1533 07/25/20 2205 07/26/20 0318  PROCALCITON <0.10  --   --   --   LATICACIDVEN  --  1.1 1.4 1.5    Recent Results (from the past 240 hour(s))  Culture, Urine     Status: Abnormal   Collection Time: 07/19/20  7:35 PM   Specimen: Urine, Random  Result Value Ref Range Status   Specimen Description URINE, RANDOM  Final   Special Requests   Final    NONE Performed at [facility], [place] [address], [place],  [address]    Culture >=100,000 COLONIES/mL PSEUDOMONAS AERUGINOSA (A)  Final   Report Status 07/22/2020 FINAL  Final   Organism ID, Bacteria PSEUDOMONAS AERUGINOSA (A)  Final      Susceptibility   Pseudomonas aeruginosa - MIC*    CEFTAZIDIME 4 SENSITIVE Sensitive     CIPROFLOXACIN <=0.25 SENSITIVE Sensitive     GENTAMICIN 2 SENSITIVE Sensitive     IMIPENEM 2 SENSITIVE Sensitive     PIP/TAZO 16 SENSITIVE Sensitive     CEFEPIME 4 SENSITIVE Sensitive     * >=100,000 COLONIES/mL PSEUDOMONAS AERUGINOSA  Respiratory Panel by RT PCR (Flu A&B, Covid) - Nasopharyngeal Swab     Status: None   Collection Time: 07/23/20  3:23 AM   Specimen: Nasopharyngeal Swab  Result Value Ref Range Status   SARS Coronavirus 2 by RT PCR NEGATIVE NEGATIVE Final    Comment: (NOTE) SARS-CoV-2 target nucleic acids are NOT DETECTED.  The SARS-CoV-2 RNA is generally detectable in upper respiratoy specimens during the acute phase of infection. The lowest concentration of SARS-CoV-2 viral copies this assay can detect is 131 copies/mL. A negative result does not  preclude SARS-Cov-2 infection and should not be used as the sole basis for treatment or other patient management decisions. A negative result may occur with  improper specimen collection/handling, submission of specimen other than nasopharyngeal swab, presence of viral mutation(s) within  the areas targeted by this assay, and inadequate number of viral copies (<131 copies/mL). A negative result must be combined with clinical observations, patient history, and epidemiological information. The expected result is Negative.  Fact Sheet for Patients:  PinkCheek.be  Fact Sheet for Healthcare Providers:  GravelBags.it  This test is no t yet approved or cleared by the [place] FDA and  has been authorized for detection and/or diagnosis of SARS-CoV-2 by FDA under an Emergency Use Authorization (EUA). This EUA will remain  in effect (meaning this test can be used) for the duration of the COVID-19 declaration under Section 564(b)(1) of the Act, 21 U.S.C. section 360bbb-3(b)(1), unless the authorization is terminated or revoked sooner.     Influenza A by PCR NEGATIVE NEGATIVE Final   Influenza B by PCR NEGATIVE NEGATIVE Final    Comment: (NOTE) The Xpert Xpress SARS-CoV-2/FLU/RSV assay is intended as an aid in  the diagnosis of influenza from Nasopharyngeal swab specimens and  should not be used as a sole basis for treatment. Nasal washings and  aspirates are unacceptable for Xpert Xpress SARS-CoV-2/FLU/RSV  testing.  Fact Sheet for Patients: PinkCheek.be  Fact Sheet for Healthcare Providers: GravelBags.it  This test is not yet approved or cleared by the [place] FDA and  has been authorized for detection and/or diagnosis of SARS-CoV-2 by  FDA under an Emergency Use Authorization (EUA). This EUA will remain  in effect (meaning this test can be used) for the duration of the  Covid-19 declaration under Section 564(b)(1) of the Act, 21  U.S.C. section 360bbb-3(b)(1), unless the authorization is  terminated or revoked. Performed at [facility], [address] [place], [place] [address]   Respiratory Panel by RT PCR (Flu A&B, Covid) -  Nasopharyngeal Swab     Status: None   Collection Time: 07/25/20  2:11 PM   Specimen: Nasopharyngeal Swab  Result Value Ref Range Status   SARS Coronavirus 2 by RT PCR NEGATIVE NEGATIVE Final    Comment: (NOTE) SARS-CoV-2 target nucleic acids are NOT DETECTED.  The SARS-CoV-2 RNA is generally detectable in upper respiratoy specimens during the acute phase of infection. The lowest concentration of SARS-CoV-2 viral copies this assay can detect is 131 copies/mL. A negative result does not preclude SARS-Cov-2 infection and should not be used as the sole basis for treatment or other patient management decisions. A negative result may occur with  improper specimen collection/handling, submission of specimen other than nasopharyngeal swab, presence of viral mutation(s) within the areas targeted by this assay, and inadequate number of viral copies (<131 copies/mL). A negative result must be combined with clinical observations, patient history, and epidemiological information. The expected result is Negative.  Fact Sheet for Patients:  PinkCheek.be  Fact Sheet for Healthcare Providers:  GravelBags.it  This test is no t yet approved or cleared by the [place] FDA and  has been authorized for detection and/or diagnosis of SARS-CoV-2 by FDA under an Emergency Use Authorization (EUA). This EUA will remain  in effect (meaning this test can be used) for the duration of the COVID-19 declaration under Section 564(b)(1) of the Act, 21 U.S.C. section 360bbb-3(b)(1), unless the authorization is terminated or revoked sooner.     Influenza A by PCR NEGATIVE NEGATIVE  Final   Influenza B by PCR NEGATIVE NEGATIVE Final    Comment: (NOTE) The Xpert Xpress SARS-CoV-2/FLU/RSV assay is intended as an aid in  the diagnosis of influenza from Nasopharyngeal swab specimens and  should not be used as a sole basis for treatment. Nasal washings and   aspirates are unacceptable for Xpert Xpress SARS-CoV-2/FLU/RSV  testing.  Fact Sheet for Patients: PinkCheek.be  Fact Sheet for Healthcare Providers: GravelBags.it  This test is not yet approved or cleared by the [place] FDA and  has been authorized for detection and/or diagnosis of SARS-CoV-2 by  FDA under an Emergency Use Authorization (EUA). This EUA will remain  in effect (meaning this test can be used) for the duration of the  Covid-19 declaration under Section 564(b)(1) of the Act, 21  U.S.C. section 360bbb-3(b)(1), unless the authorization is  terminated or revoked. Performed at [facility], [address] [place], [place] [address]   Blood culture (routine x 2)     Status: None (Preliminary result)   Collection Time: 07/25/20  3:34 PM   Specimen: BLOOD  Result Value Ref Range Status   Specimen Description BLOOD BLOOD RIGHT FOREARM  Final   Special Requests   Final    BOTTLES DRAWN AEROBIC AND ANAEROBIC Blood Culture adequate volume   Culture   Final    NO GROWTH 2 DAYS Performed at [facility], [address] [place], [place] [address]    Report Status PENDING  Incomplete  Blood culture (routine x 2)     Status: None (Preliminary result)   Collection Time: 07/25/20  3:34 PM   Specimen: BLOOD  Result Value Ref Range Status   Specimen Description BLOOD BLOOD LEFT FOREARM  Final   Special Requests   Final    BOTTLES DRAWN AEROBIC AND ANAEROBIC Blood Culture adequate volume   Culture   Final    NO GROWTH 2 DAYS Performed at [facility], [address], [place], [place] [address]    Report Status PENDING  Incomplete  Culture, respiratory (non-expectorated)     Status: None (Preliminary result)   Collection Time: 07/25/20  6:36 PM   Specimen: Tracheal Aspirate; Respiratory  Result Value Ref Range Status   Specimen Description   Final    TRACHEAL ASPIRATE Performed at  [facility], [address] [place], [place] [address]    Special Requests   Final    NONE Performed at [facility], [address] [place], [place] [address]    Gram Stain   Final    MODERATE WBC PRESENT,BOTH PMN AND MONONUCLEAR FEW GRAM POSITIVE COCCI IN CHAINS FEW GRAM POSITIVE RODS RARE GRAM NEGATIVE RODS    Culture   Final    CULTURE REINCUBATED FOR BETTER GROWTH Performed at [facility], [place] [address], [place], [place] [address]    Report Status PENDING  Incomplete  MRSA PCR Screening     Status: None   Collection Time: 07/26/20  4:51 AM   Specimen: Nasopharyngeal  Result Value Ref Range Status   MRSA by PCR NEGATIVE NEGATIVE Final    Comment:        The GeneXpert MRSA Assay (FDA approved for NASAL specimens only), is one component of a comprehensive MRSA colonization surveillance program. It is not intended to diagnose MRSA infection nor to guide or monitor treatment for MRSA infections. Performed at [facility], [address], [place], [place] [address]       Imaging  Studies   ECHOCARDIOGRAM COMPLETE  Result Date: 07/26/2020    ECHOCARDIOGRAM REPORT   Patient Name:   Ryan Day Date of Exam: 07/26/2020 Medical Rec #:  [id number]   Height:       77.0 in Accession #:    [id number]  Weight:       238.1 lb Date of Birth:  1951/01/11    BSA:          2.409 m Patient Age:    58 years    BP:           105/67 mmHg Patient Gender: M           HR:           72 bpm. Exam Location:  [facility] Procedure: 2D Echo, Cardiac Doppler and Color Doppler Indications:     Hypotension  History:         Patient has no prior history of Echocardiogram examinations.                  Risk Factors:Hypertension and Dyslipidemia.  Sonographer:     Sherrie Sport RDCS (AE) Referring Phys:  [id number] Para Skeans Diagnosing Phys: Kate Sable MD  Sonographer Comments: Suboptimal apical window and no subcostal window. IMPRESSIONS  1. Left ventricular ejection  fraction, by estimation, is 55 to 60%. The left ventricle has normal function. The left ventricle has no regional wall motion abnormalities. Left ventricular diastolic parameters are consistent with Grade I diastolic dysfunction (impaired relaxation).  2. Right ventricular systolic function is normal. The right ventricular size is normal.  3. The mitral valve is normal in structure. No evidence of mitral valve regurgitation. No evidence of mitral stenosis.  4. The aortic valve is normal in structure. Aortic valve regurgitation is not visualized. No aortic stenosis is present. FINDINGS  Left Ventricle: Left ventricular ejection fraction, by estimation, is 55 to 60%. The left ventricle has normal function. The left ventricle has no regional wall motion abnormalities. The left ventricular internal cavity size was normal in size. There is  no left ventricular hypertrophy. Left ventricular diastolic parameters are consistent with Grade I diastolic dysfunction (impaired relaxation). Right Ventricle: The right ventricular size is normal. No increase in right ventricular wall thickness. Right ventricular systolic function is normal. Left Atrium: Left atrial size was normal in size. Right Atrium: Right atrial size was normal in size. Pericardium: There is no evidence of pericardial effusion. Mitral Valve: The mitral valve is normal in structure. No evidence of mitral valve regurgitation. No evidence of mitral valve stenosis. Tricuspid Valve: The tricuspid valve is normal in structure. Tricuspid valve regurgitation is not demonstrated. No evidence of tricuspid stenosis. Aortic Valve: The aortic valve is normal in structure. Aortic valve regurgitation is not visualized. No aortic stenosis is present. Aortic valve mean gradient measures 5.0 mmHg. Aortic valve peak gradient measures 9.0 mmHg. Aortic valve area, by VTI measures 2.27 cm. Pulmonic Valve: The pulmonic valve was normal in structure. Pulmonic valve regurgitation is  not visualized. No evidence of pulmonic stenosis. Aorta: The aortic root is normal in size and structure. Venous: The inferior vena cava was not well visualized. IAS/Shunts: No atrial level shunt detected by color flow Doppler.  LEFT VENTRICLE PLAX 2D LVIDd:         4.92 cm  Diastology LVIDs:         3.15 cm  LV e' medial:    5.66 cm/s LV PW:         1.35  cm  LV E/e' medial:  13.6 LV IVS:        1.26 cm  LV e' lateral:   13.80 cm/s LVOT diam:     2.00 cm  LV E/e' lateral: 5.6 LV SV:         63 LV SV Index:   26 LVOT Area:     3.14 cm  RIGHT VENTRICLE RV Basal diam:  3.38 cm RV S prime:     15.40 cm/s TAPSE (M-mode): 3.6 cm LEFT ATRIUM           Index       RIGHT ATRIUM           Index LA diam:      2.70 cm 1.12 cm/m  RA Area:     17.80 cm LA Vol (A4C): 48.0 ml 19.93 ml/m RA Volume:   44.20 ml  18.35 ml/m  AORTIC VALVE                   PULMONIC VALVE AV Area (Vmax):    1.87 cm    PV Vmax:        1.11 m/s AV Area (Vmean):   1.90 cm    PV Peak grad:   4.9 mmHg AV Area (VTI):     2.27 cm    RVOT Peak grad: 5 mmHg AV Vmax:           150.00 cm/s AV Vmean:          97.750 cm/s AV VTI:            0.278 m AV Peak Grad:      9.0 mmHg AV Mean Grad:      5.0 mmHg LVOT Vmax:         89.40 cm/s LVOT Vmean:        59.000 cm/s LVOT VTI:          0.201 m LVOT/AV VTI ratio: 0.72  AORTA Ao Root diam: 3.10 cm MITRAL VALVE                TRICUSPID VALVE MV Area (PHT): 2.29 cm     TR Peak grad:   40.7 mmHg MV Decel Time: 332 msec     TR Vmax:        319.00 cm/s MV E velocity: 77.10 cm/s MV A velocity: 107.00 cm/s  SHUNTS MV E/A ratio:  0.72         Systemic VTI:  0.20 m                             Systemic Diam: 2.00 cm Kate Sable MD Electronically signed by Kate Sable MD Signature Date/Time: 07/26/2020/12:52:51 PM    Final      Medications   Scheduled Meds: . ascorbic acid  500 mg Per Tube BID  . baclofen  5 mg Per Tube TID  . bisacodyl  10 mg Rectal QHS  . chlorhexidine  15 mL Mouth Rinse BID  .  Chlorhexidine Gluconate Cloth  6 each Topical Daily  . collagenase   Topical Daily  . enoxaparin (LOVENOX) injection  40 mg Subcutaneous Q24H  . famotidine  20 mg Per Tube Daily  . feeding supplement (OSMOLITE 1.5 CAL)  474 mL Per Tube QID  . feeding supplement (PROSource TF)  45 mL Per Tube BID  . ferrous sulfate  75 mg Per Tube Daily  . fludrocortisone  0.2 mg Per  Tube Daily  . FLUoxetine  40 mg Per Tube Daily  . free water  300 mL Per Tube Q4H  . guaiFENesin  20 mL Per Tube Q6H  . mouth rinse  15 mL Mouth Rinse q12n4p  . midodrine  10 mg Per Tube TID WC  . multivitamin  5 mL Per Tube Daily  . nutrition supplement (JUVEN)  1 packet Per Tube BID BM  . polycarbophil  625 mg Per Tube Daily  . saccharomyces boulardii  250 mg Per Tube BID  . [START ON 07/28/2020] scopolamine  1 patch Transdermal Q72H  . simethicone  80 mg Per Tube QID  . traZODone  50 mg Per Tube QHS   Continuous Infusions: . sodium chloride Stopped (07/25/20 2347)  . ampicillin-sulbactam (UNASYN) IV 3 g (07/27/20 0904)  . dextrose 5 % and 0.45% NaCl 75 mL/hr at 07/27/20 1238       LOS: 2 days    Time spent: 30 minutes with > 50% spent in coordination of care and direct patient contact.    Ezekiel Slocumb, DO [facility]  07/27/2020, 3:03 PM    If 7PM-7AM, please contact night-coverage. How to contact the [facility] Attending or Consulting provider [place] or covering provider during after hours [place], for this patient?    1. Check the care team in [facility] and look for a) attending/consulting [facility] provider listed and b) the [facility] team listed 2. Log into [email or web address] and use [place]'s universal password to access. If you do not have the password, please contact the hospital operator. 3. Locate the [facility] provider you are looking for under [facility] and page to a number that you can be directly reached. 4. If you still have difficulty reaching the provider, please page the [facility] (Director on Call) for the  Hospitalists listed on amion for assistance.

## 2020-07-27 NOTE — Progress Notes (Addendum)
Initial Nutrition Assessment  DOCUMENTATION CODES:   Not applicable  INTERVENTION:  Continue Osmolite 1.5 Cal 474 mL (2 cartons) Osmolite 1.5 Cal QID per tube + PROSource TF 45 mL BID per tube. Provides 2920 kcal, 141 grams of protein, 1448 mL H2O daily.  Provide free water flush of 300 mL Q4hrs between feeds. Also flush tube with 30 mL before and after each feeding to maintain patency. This provides a total of 3488 mL H2O daily including water in tube feeding regimen.  Continue Juven 1 packet per tube BID. Each packet provides 95 kcal, 7 grams L-Arginine, 7 grams L-Glutamine, 2.5 grams collagen protein, 300 mg vitamin C, 9.5 mg zinc, and other micronutrients essential for wound healing.  NUTRITION DIAGNOSIS:   Inadequate oral intake related to inability to eat as evidenced by NPO status (reliance on tube feeds and free water per G-tube to meet calorie/protein/hydration needs.).  GOAL:   Patient will meet greater than or equal to 90% of their needs  MONITOR:   Labs, Weight trends, TF tolerance, Skin, I & O's  REASON FOR ASSESSMENT:   New TF    ASSESSMENT:   69 year old male with PMHx of HLD, HTN, CHF who presented to [facility] on 04/26/2020 after a fall from standing with SCI/quadriplegia s/p tracheostomy and PEG tube placement, discharged to [facility] 06/07/2020, and then was admitted at [place] 10/1-10/28. Patient now admitted with aspiration PNA, hypotension.   Met with patient at bedside. Patient with trach in place. He reports he is tolerating his tube feed regimen. He endorses he receives Osmolite 1.5 Cal 2 cartons QID. He is unsure if he receives protein modular at home or what his free water flush regimen is. Spoke with patient's wife and daughter over the phone. They also confirm patient is on Osmolite 1.5 Cal 2 cartons QID, PROSource Plus 45 mL BID, Juven BID. They give free water flush of 30 mL before and after each feed and then  300 mL Q4hrs timed between feeds.  Patient unsure of weight history/trend. Per chart he was 132.5 kg on 03/01/2020, but unsure if this is accurate as he was documented to be 108.1 kg on 02/01/2020. He is currently documented to be 108 kg (238.1 lbs).  Enteral Access: 20 Fr. G-tube present on admission (appears to be an [employer] fast streamer G-tube with c-clamp)  Medications reviewed and include: vitamin C 500 mg BID per tube, famotidine, Osmolite 1.5 Cal 474 mL QID per tube, PROSource Plus 45 mL BID per tube, ferrous sulfate 75 mg daily per tube, liquid MVI daily per tube, Juven 1 packet BID per tube, Fibercon 625 mg daily per tube, Florastor 250 mg BID per tube, simethicone 80 mg QID per tube, Unasyn, D5-1/2 NS at 75 mL/hr.  Labs reviewed: CBG 170, Sodium 148, Chloride 112, BUN 26.  Discussed with RN. She did not give patient full amount of tube feed formula this AM as he became distended. Patient's gastric distention is normal at time of feeds. Patient endorses this is normal and he still tolerates his feeds.   Discussed with MD via secure chat. Plan is to resume home free water regimen per tube.  NUTRITION - FOCUSED PHYSICAL EXAM:    Most Recent Value  Orbital Region No depletion  Upper Arm Region No depletion  Thoracic and Lumbar Region No depletion  Buccal Region No depletion  Temple Region No depletion  Clavicle Bone Region Moderate depletion  Clavicle and Acromion Bone Region Moderate  depletion  Scapular Bone Region Unable to assess  Dorsal Hand Moderate depletion  Patellar Region Moderate depletion  Anterior Thigh Region Moderate depletion  Posterior Calf Region Unable to assess  Edema (RD Assessment) --  [non-pitting]  Hair Reviewed  Eyes Reviewed  Mouth Reviewed  Skin Reviewed  Nails Reviewed     Diet Order:   Diet Order            Diet NPO time specified  Diet effective now                EDUCATION NEEDS:   No education needs have been identified at this  time  Skin:  Skin Integrity Issues:: Unstageable Unstageable: sacrum  Last BM:  07/26/2020 per chart  Height:   Ht Readings from Last 1 Encounters:  07/25/20 _0  (1.956 m)   Weight:   Wt Readings from Last 1 Encounters:  07/25/20 108 kg   BMI:  Body mass index is 28.23 kg/m.  Estimated Nutritional Needs:   Kcal:  2600-2800  Protein:  145-165 grams  Fluid:  >/= 2.4 L/day  Jacklynn Barnacle, MS, RD, LDN Pager number available on Amion

## 2020-07-27 NOTE — Consult Note (Signed)
Consultation Note Date: 07/27/2020   Patient Name: Ryan Day  DOB: 26-Jul-1951  MRN: [id number]  Age / Sex: 69 y.o., male  PCP: Ryan Roys, DO Referring Physician: Ezekiel Slocumb, DO  Reason for Consultation: Establishing goals of care  HPI/Patient Profile: 69 y.o. male  with past medical history of hypertension, dyslipidemia, C5 fracture and spinal cord injury with quadriplegia s/p fall August 2021 requiring 2 month admission at [facility] (PEA arrest during this admission thought due to mucous plugging) followed by stay at [facility], s/p trach/PEG admitted on 07/25/2020 with short of breath with copious sputum and aspiration pneumonia. Also found to have stage 2 sacral decubitus ulcer and hypotension likely secondary to autonomic dysfunction from spinal cord injury. Seen by palliative team 07/17/20 at [facility] with completion of MOST form for full code, full aggressive care.   Clinical Assessment and Goals of Care: I met today with Ryan Day but no family present. He is alert and oriented. Voice is suprisingly strong. He is also working to get out secretions and I assisted with yankeur. We spent time discussing what he has been through and how this has effected his ability to swallow and clear secretions leading to constant struggle with secretions. Also going to be a constant struggle to keep them cleared and from seeping down into his lungs and causing infection, mucous plugging, and other complications. He verbalizes understanding.   We discussed that he has been through an a lot and I asked him how he is doing holding up through all this. Overall he is doing as well as expected. He finds strength through his faith and his family. He is still trying to adjust to all the changes in his life. He is tired of sleeping and watching television. He reviews that he has been to 3 different hospitals over the past 3 months.  He got home for a few days. We discussed how the transition to home was and if he still thought he can have the care he needs at home. Ryan Day shares that they expected to have some troubles with initial transition and adjustment. At this time his hope is to return back home from this hospitalization with the assistance from his wife and 2 daughters who are RNs.   We also spoke about his goals of care. He reports that he has had some discussions with his wife and they are able to talk about these difficult decisions. We discussed concern with his ongoing secretions and that this can lead to further complications. He is doing very well right now with strong speech and although with copious secretions he is able to clear well. However, it is worth a consideration of his wishes if his health were to worsen. He is still hopeful but understands. He will need to think more about what he would want. He does not want to go back to ventilator support and that type of care but cannot definitively say if he has any clear limitations of anything he would NOT want at  this stage. I reassured him that this okay and just a discussion and encouraged him to discuss further with his family as well. We just want to ensure that we care for him appropriately and follow his wishes.   Of note, we discussed use of call bell with plans to move to floor. He describes a whistle like call bell that he had at [facility]. After some digging it appears that OT assisted with this at [facility]. I have consulted OT to see if they can assist. He also had a suction yankeur set up close to head so that he could utilize himself.   I attempted to call and speak with his wife (with his permission) but no answer. Will have my colleagues continue to try attempts. They had already had referral for outpatient palliative care. Based on my conversation with Ryan Day today there are no plans for comfort care so hospice would not be appropriate at home.   All  questions/concerns addressed. Emotional support provided.   Primary Decision Maker PATIENT    SUMMARY OF RECOMMENDATIONS   - Ongoing goals of care discussions - Seems more appropriate for outpatient palliative at this time  Code Status/Advance Care Planning:  Full code   Symptom Management:   Secretions: Per PCCM recommendations. Already on scopolamine patch. Monitor closely for mucous plugging.   Palliative Prophylaxis:   Aspiration, Bowel Regimen and Turn Reposition  Additional Recommendations (Limitations, Scope, Preferences):  Full Scope Treatment  Psycho-social/Spiritual:   Desire for further Chaplaincy support:yes  Additional Recommendations: Caregiving  Support/Resources and Education on Hospice  Prognosis:   Concern for overall prognosis given significant life and health changes after spinal cord injury and adjusting to quadriplegia.   Discharge Planning: To Be Determined      Primary Diagnoses: Present on Admission: . Quadriplegia ([place]) . Hypertension . Aspiration pneumonia ([place])   I have reviewed the medical record, interviewed the patient and family, and examined the patient. The following aspects are pertinent.  Past Medical History:  Diagnosis Date  . Acute on chronic respiratory failure with hypoxia ([place])   . Acute renal injury due to hypovolemia ([place])   . Autonomic instability   . Cardiac arrest ([place])   . Cervical spinal cord injury, sequela ([place])   . Elevated alkaline phosphatase level   . 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 Hyperlipidemia   . Hypertension    Social History   Socioeconomic History  . Marital status: Married    Spouse name: Not on file  . Number of children: Not on file  . Years of education: Not on file  . Highest education level: Not on file  Occupational History  . Not on file  Tobacco Use  . Smoking status: Never Smoker  . Smokeless tobacco: Never Used  Substance and Sexual Activity  .  Alcohol use: Yes    Comment: 1 or less  . Drug use: No  . Sexual activity: Not on file  Other Topics Concern  . Not on file  Social History Narrative  . Not on file   Social Determinants of Health   Financial Resource Strain:   . Difficulty of Paying Living Expenses: Not on file  Food Insecurity:   . Worried About Charity fundraiser in the Last Year: Not on file  . Ran Out of Food in the Last Year: Not on file  Transportation Needs:   . Lack of Transportation (Medical): Not on file  . Lack of  Transportation (Non-Medical): Not on file  Physical Activity:   . Days of Exercise per Week: Not on file  . Minutes of Exercise per Session: Not on file  Stress:   . Feeling of Stress : Not on file  Social Connections:   . Frequency of Communication with Friends and Family: Not on file  . Frequency of Social Gatherings with Friends and Family: Not on file  . Attends Religious Services: Not on file  . Active Member of Clubs or Organizations: Not on file  . Attends Archivist Meetings: Not on file  . Marital Status: Not on file   Family History  Problem Relation Age of Onset  . Cancer Father        lung  . Cancer Brother        throat  . Heart disease Brother 92  . Liver cancer Brother   . Alcohol abuse Brother    Scheduled Meds: . ascorbic acid  500 mg Per Tube BID  . baclofen  5 mg Per Tube TID  . bisacodyl  10 mg Rectal QHS  . chlorhexidine  15 mL Mouth Rinse BID  . Chlorhexidine Gluconate Cloth  6 each Topical Daily  . collagenase   Topical Daily  . enoxaparin (LOVENOX) injection  40 mg Subcutaneous Q24H  . famotidine  20 mg Per Tube Daily  . feeding supplement (OSMOLITE 1.5 CAL)  474 mL Per Tube QID  . feeding supplement (PROSource TF)  45 mL Per Tube BID  . ferrous sulfate  75 mg Per Tube Daily  . fludrocortisone  0.2 mg Per Tube Daily  . FLUoxetine  40 mg Per Tube Daily  . free water  300 mL Per Tube Q4H  . guaiFENesin  20 mL Per Tube Q6H  . mouth rinse   15 mL Mouth Rinse q12n4p  . midodrine  10 mg Per Tube TID WC  . multivitamin  5 mL Per Tube Daily  . nutrition supplement (JUVEN)  1 packet Per Tube BID BM  . polycarbophil  625 mg Per Tube Daily  . saccharomyces boulardii  250 mg Per Tube BID  . [START ON 07/28/2020] scopolamine  1 patch Transdermal Q72H  . simethicone  80 mg Per Tube QID  . traZODone  50 mg Per Tube QHS   Continuous Infusions: . sodium chloride Stopped (07/25/20 2347)  . ampicillin-sulbactam (UNASYN) IV 3 g (07/27/20 0904)  . dextrose 5 % and 0.45% NaCl 75 mL/hr at 07/27/20 1238   PRN Meds:.acetaminophen No Known Allergies Review of Systems  Constitutional: Positive for fatigue.  HENT: Positive for trouble swallowing.   Respiratory: Negative for choking and shortness of breath.     Physical Exam Vitals and nursing note reviewed.  Constitutional:      General: He is not in acute distress.    Appearance: He is ill-appearing.  Cardiovascular:     Rate and Rhythm: Normal rate.  Pulmonary:     Effort: No tachypnea, accessory muscle usage or respiratory distress.     Comments: Increased secretions, thin clear/white Abdominal:     Palpations: Abdomen is soft.  Neurological:     Mental Status: He is alert and oriented to person, place, and time.     Vital Signs: BP 99/60   Pulse 67   Temp 98.7 F (37.1 C) (Axillary)   Resp (!) 24   Ht _0  (1.956 m)   Wt 108 kg   SpO2 91%   BMI 28.23 kg/m  Pain  Scale: 0-10   Pain Score: 0-No pain   SpO2: SpO2: 91 % O2 Device:SpO2: 91 % O2 Flow Rate: .   IO: Intake/output summary:   Intake/Output Summary (Last 24 hours) at 07/27/2020 1418 Last data filed at 07/27/2020 1655 Gross per 24 hour  Intake 1358.67 ml  Output 1850 ml  Net -491.33 ml    LBM: Last BM Date: 07/26/20 Baseline Weight: Weight: 108 kg Most recent weight: Weight: 108 kg     Palliative Assessment/Data: 30%     Time In: 1500 Time Out: 1550 Time Total: 50 min Greater than 50%  of  this time was spent counseling and coordinating care related to the above assessment and plan.  Signed by: Vinie Sill, NP Palliative Medicine Team Pager # [phone] (M-F 8a-5p) Team Phone # [phone] (Nights/Weekends)

## 2020-07-28 ENCOUNTER — Telehealth: Payer: Self-pay

## 2020-07-28 ENCOUNTER — Inpatient Hospital Stay: Payer: BC Managed Care – PPO

## 2020-07-28 LAB — BASIC METABOLIC PANEL
Anion gap: 10 (ref 5–15)
BUN: 27 mg/dL — ABNORMAL HIGH (ref 8–23)
CO2: 26 mmol/L (ref 22–32)
Calcium: 9.6 mg/dL (ref 8.9–10.3)
Chloride: 113 mmol/L — ABNORMAL HIGH (ref 98–111)
Creatinine, Ser: 0.8 mg/dL (ref 0.61–1.24)
GFR, Estimated: >60 mL/min (ref >60)
Glucose, Bld: 97 mg/dL (ref 70–99)
Potassium: 3.7 mmol/L (ref 3.5–5.1)
Sodium: 149 mmol/L — ABNORMAL HIGH (ref 135–145)

## 2020-07-28 LAB — CULTURE, RESPIRATORY W GRAM STAIN: Culture: NORMAL

## 2020-07-28 LAB — CBC
HCT: 23.1 % — ABNORMAL LOW (ref 39.0–52.0)
Hemoglobin: 7.1 g/dL — ABNORMAL LOW (ref 13.0–17.0)
MCH: 27.4 pg (ref 26.0–34.0)
MCHC: 30.7 g/dL (ref 30.0–36.0)
MCV: 89.2 fL (ref 80.0–100.0)
Platelets: 409 10*3/uL — ABNORMAL HIGH (ref 150–400)
RBC: 2.59 MIL/uL — ABNORMAL LOW (ref 4.22–5.81)
RDW: 17.6 % — ABNORMAL HIGH (ref 11.5–15.5)
WBC: 7.6 10*3/uL (ref 4.0–10.5)
nRBC: 0.5 % — ABNORMAL HIGH (ref 0.0–0.2)

## 2020-07-28 IMAGING — DX DG ABD PORTABLE 1V
2 series · 2 of 2 positions shown · non-contrast
Comparison: [DATE]

CLINICAL DATA: Abdominal distension.

EXAM:
PORTABLE ABDOMEN - 1 VIEW

[abdomen supine (1 of 2)]
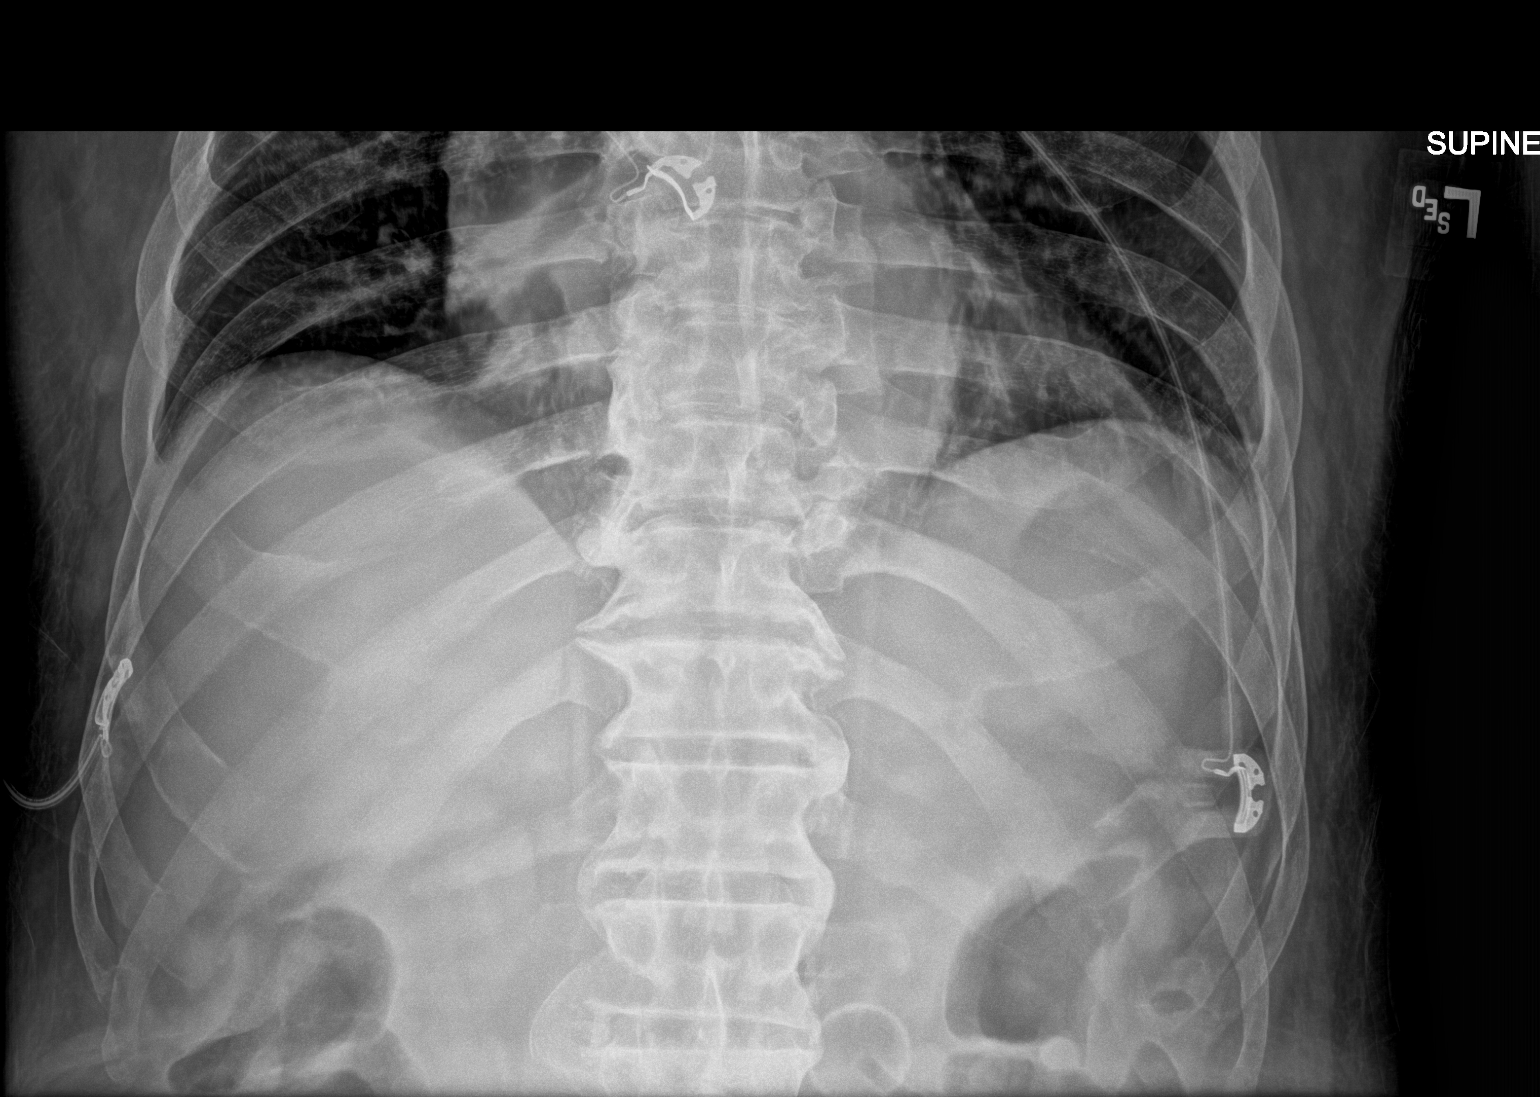

[abdomen supine (2 of 2)]
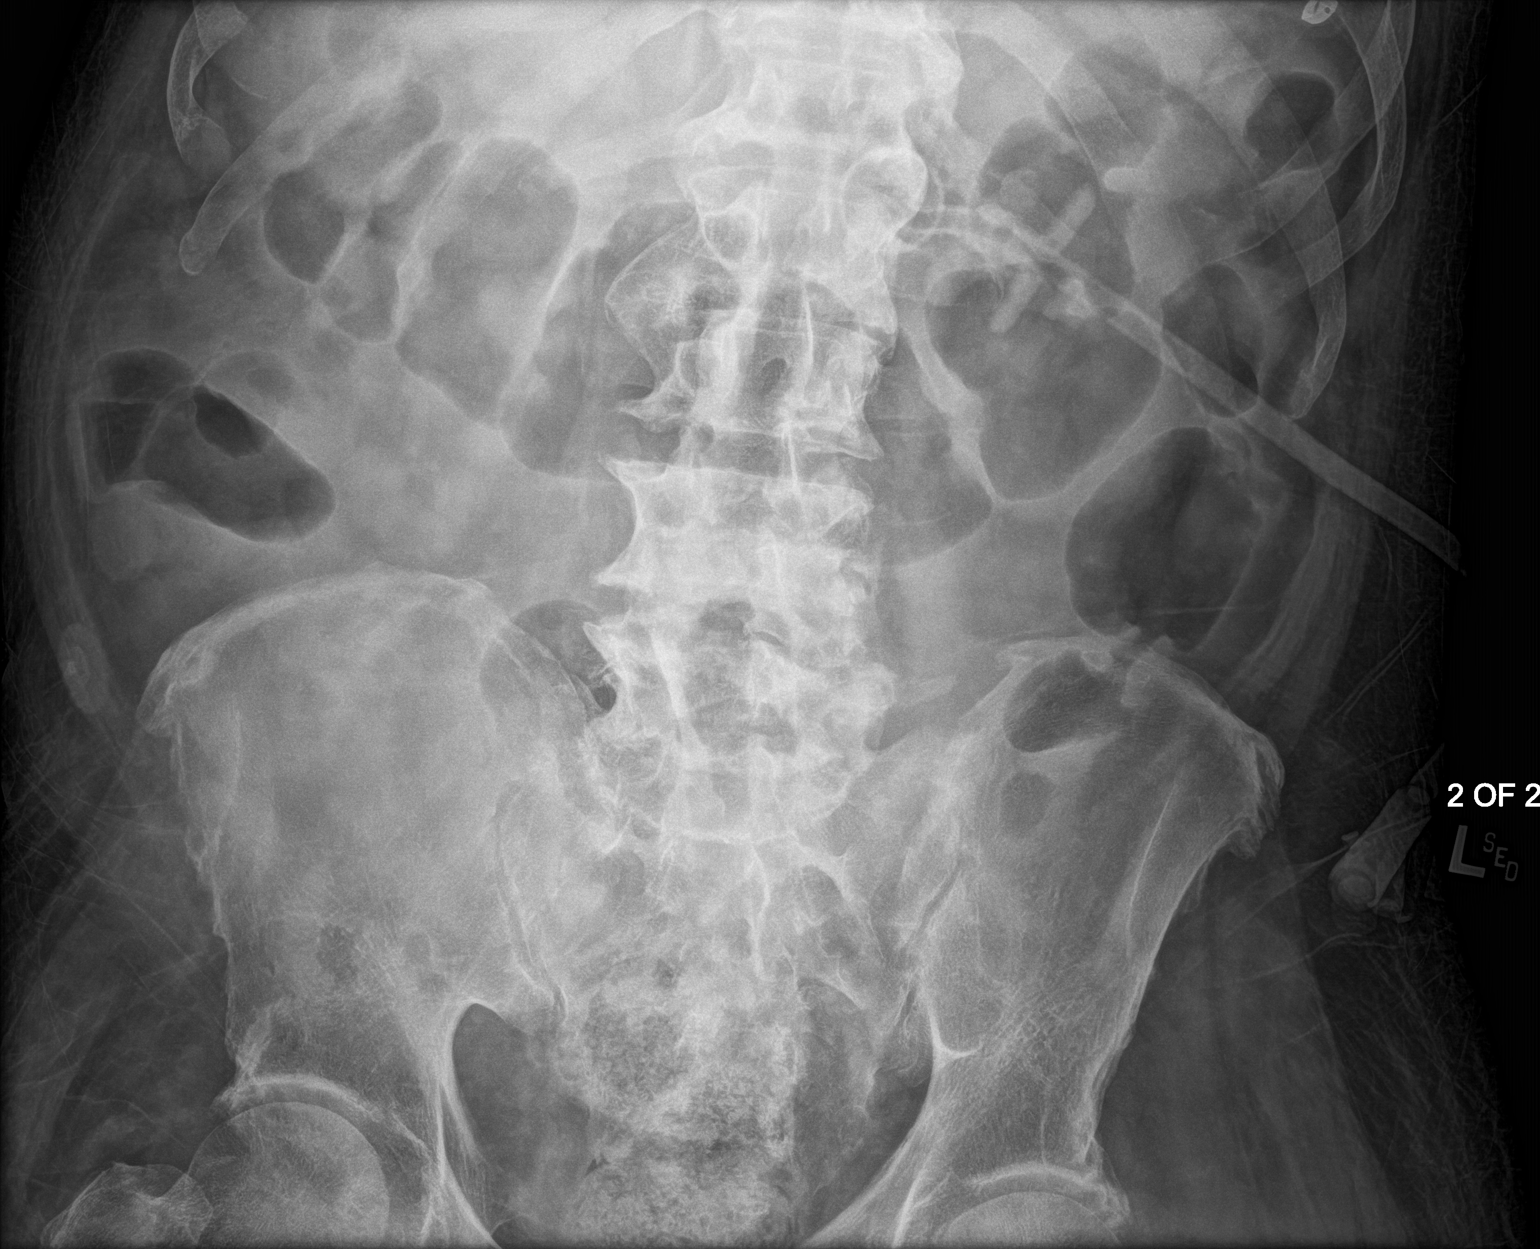

[2 of 2 positions shown; findings below may reference images not displayed]

FINDINGS: Left upper quadrant gastrostomy tube is identified. There is mild
gaseous distension of the transverse colon and descending colon. No
dilated loops of small bowel. Desiccated stool noted within the
rectum. No radio-opaque calculi or other significant radiographic
abnormality are seen.
IMPRESSION: 1. Nonobstructive bowel gas pattern.
2. Desiccated stool within the rectum. Correlate for any clinical
signs or symptoms of fecal impaction.

## 2020-07-28 MED ORDER — BISACODYL 10 MG RE SUPP
10.0000 mg | Freq: Every day | RECTAL | Status: DC | PRN
  Filled 2020-07-28: qty 1

## 2020-07-28 MED ORDER — LIDOCAINE 5 % EX PTCH
1.0000 | MEDICATED_PATCH | CUTANEOUS | Status: DC
  Administered 2020-07-28 – 2020-08-08 (×12): 1 via TRANSDERMAL
  Filled 2020-07-28 (×13): qty 1

## 2020-07-28 MED ORDER — BISACODYL 10 MG RE SUPP
10.0000 mg | Freq: Every day | RECTAL | Status: DC
  Administered 2020-07-28 – 2020-08-05 (×6): 10 mg via RECTAL
  Filled 2020-07-28 (×7): qty 1

## 2020-07-28 MED ORDER — DEXTROSE 5 % IV SOLN: INTRAVENOUS | Status: DC

## 2020-07-28 MED ORDER — FREE WATER
400.0000 mL | Status: DC
  Administered 2020-07-28 – 2020-08-06 (×49): 400 mL

## 2020-07-28 NOTE — Progress Notes (Signed)
PROGRESS NOTE    Ryan Day   DQQ:[id number]  DOB: 1950/12/25  PCP: Valerie Roys, DO    DOA: 07/25/2020 LOS: 3   Brief Narrative   HPI on admission: "Ryan Day is a 69 y.o. 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 After fall pt was managed at [facility] in [place] for 2 months  And has rehab at [facility] [place]. Pt had  Trach and peg during [place] stay . Pt is alert and awake and oriented x 3. Pt has been coughing and copious amount of sputum. "  Admitted to hospitalist service with aspiration pneumonia.     Assessment & Plan   Principal Problem:   Aspiration pneumonia ([place]) Active Problems:   Hypertension   Quadriplegia ([place])   Sacral decubitus ulcer, stage II (HCC)   Goals of care, counseling/discussion   Aspiration pneumonia - present on admission with SOB, copious sputum production, difficulty clearing secretions.  Chest xray consistent with aspiration PNA. 11/4 - continues to have copious thick secretions, but none coming out or around trach. 11/5 - tachypnea and hypoxic this morning, placed on nasal cannula --Continue Unasyn --Aspiration precautions --Diligent oral care --SLP evaluation --RT following --PRN Tylenol if fevers --PRN antitussives --scopolamine patch to dry up secretions --continue close monitoring in stepdown  Hypernatremia - Sodium 149 on AM labs 11/5.  Increase free water to 400cc q4h (from 300cc).   Contniue D5W at 50 cc/hr for today.  Hypotension - likely multifactorial due to sepsis/infection and hypovolemia, and replated to autonominc dysfunction with spinal cord injury.  Family report hypotension at home. --Pressors if needed to keep MAP>=65 --continue Florinef and midodrine --PCCM consulted  Iron deficiency anemia - monitor CBC.  Transfuse for Hbg < 7.0.  Continue iron supplement.  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   --Tube feeds with free water flushes per orders. --Routine trach care --Continue baclofen --Diligent bowel regimen - suppository and manual stimulation at bedtime.  PRN suppository if needed during day.  Sacral decubitus ulcer, stage II  - POA.  WOC following.  Due to quadriplegic status and immobillity.   --Reposition patient at least every 2 hours. Pressure Injury 07/26/20 Sacrum Unstageable - Full thickness tissue loss in which the base of the injury is covered by slough (yellow, tan, gray, green or brown) and/or eschar (tan, brown or black) in the wound bed. (Active)  07/26/20 0513  Location: Sacrum  Location Orientation:   Staging: Unstageable - Full thickness tissue loss in which the base of the injury is covered by slough (yellow, tan, gray, green or brown) and/or eschar (tan, brown or black) in the wound bed.  Wound Description (Comments):   Present on Admission: Yes    Hx of Hypertension - not on antihypertensives, but on Florinef  Hx of depression - continue Prozac  Right shoulder pain - chronic.  Lidoderm patches.     DVT prophylaxis: enoxaparin (LOVENOX) injection 40 mg Start: 07/26/20 1000 SCDs Start: 07/25/20 2243   Diet:  Diet Orders (From admission, onward)    Start     Ordered   07/25/20 2244  Diet NPO time specified  Diet effective now        07/25/20 2244            Code Status: Full Code    Subjective 07/28/20    Pt seen this  AM with wife at bedside.  He is tachypneic but states he does not feel short of breath.  Placed on 2 L/min nasal cannula due to sats in upper 80's.  Says abdomen feels like a tight belt around it, is more distended today.  Wife explained home bowel regimen which has been ordered.  Abdominal xray reassuring, no obstruction, but stool in rectum.  Pt denies pain or other acute complaints at this time.   Disposition Plan & Communication   Status is: Inpatient  Inpatient remains  appropriate status due to severity of illness requiring IV therapies as above.  Dispo: The patient is from: Home              Anticipated d/c is to: Home              Anticipated d/c date is: 2-3 days              Patient currently is not medically stable for d/c.  Family Communication: wife [name] at bedside during encounter this AM.   Consults, Procedures, Significant Events   Consultants:   [facility]  Palliative care  Procedures:   none  Antimicrobials:  Anti-infectives (From admission, onward)   Start     Dose/Rate Route Frequency Ordered Stop   07/26/20 1600  Ampicillin-Sulbactam (UNASYN) 3 g in sodium chloride 0.9 % 100 mL IVPB        3 g 200 mL/hr over 30 Minutes Intravenous Every 6 hours 07/26/20 1130     07/26/20 1200  vancomycin (VANCOCIN) IVPB 1000 mg/200 mL premix  Status:  Discontinued        1,000 mg 200 mL/hr over 60 Minutes Intravenous Every 12 hours 07/25/20 2304 07/26/20 1106   07/25/20 2315  vancomycin (VANCOREADY) IVPB 2000 mg/400 mL       "Followed by" Linked Group Details   2,000 mg 200 mL/hr over 120 Minutes Intravenous  Once 07/25/20 2301 07/26/20 0205   07/25/20 2315  vancomycin (VANCOREADY) IVPB 500 mg/100 mL       "Followed by" Linked Group Details   500 mg 100 mL/hr over 60 Minutes Intravenous  Once 07/25/20 2301 07/26/20 0323   07/25/20 1500  piperacillin-tazobactam (ZOSYN) IVPB 3.375 g  Status:  Discontinued        3.375 g 12.5 mL/hr over 240 Minutes Intravenous Every 8 hours 07/25/20 1446 07/26/20 1129   07/25/20 1430  Ampicillin-Sulbactam (UNASYN) 3 g in sodium chloride 0.9 % 100 mL IVPB  Status:  Discontinued        3 g 200 mL/hr over 30 Minutes Intravenous  Once 07/25/20 1418 07/25/20 1446        Objective   Vitals:   07/28/20 0434 07/28/20 0907 07/28/20 1136 07/28/20 1535  BP: 111/71 (!) 107/58 (!) 104/59 117/60  Pulse: 86 77 93 100  Resp: 18 18 16  (!) 24  Temp: 98.1 F (36.7 C) 98.6 F (37 C) 98.2 F (36.8 C) 97.8 F (36.6 C)   TempSrc: Oral Oral Oral Oral  SpO2: 95% 95% 99% 99%  Weight:      Height:        Intake/Output Summary (Last 24 hours) at 07/28/2020 1626 Last data filed at 07/28/2020 1500 Gross per 24 hour  Intake 1932.43 ml  Output 2602 ml  Net -669.57 ml   Filed Weights   07/25/20 1300  Weight: 108 kg    Physical Exam:  General exam: awake, alert, no acute distress HEENT: trach in place capped, on room air,  hearing grossly normal  Respiratory system: referred upper airway secretion sounds, no wheezes, normal respiratory effort. Cardiovascular system: normal S1/S2, RRR, no pedal edema.   Gastrointestinal system: soft, NT, ND, PEG tube in place Psychiatry: normal mood, congruent affect, judgement and insight appear normal   Labs   Data Reviewed: I have personally reviewed following labs and imaging studies  CBC: Recent Labs  Lab 07/23/20 0343 07/25/20 1308 07/27/20 0726 07/28/20 0605  WBC 7.1 8.9 8.5 7.6  NEUTROABS  --  5.9 5.8  --   HGB 8.2* 8.6* 7.9* 7.1*  HCT 25.4* 27.1* 25.6* 23.1*  MCV 87.9 88.6 89.2 89.2  PLT 378 421* 321 782*   Basic Metabolic Panel: Recent Labs  Lab 07/23/20 0343 07/25/20 1308 07/27/20 0726 07/28/20 0605  NA 137 141 148* 149*  K 3.6 4.1 3.8 3.7  CL 100 104 112* 113*  CO2 28 28 27 26   GLUCOSE 103* 72 102* 97  BUN 21 26* 26* 27*  CREATININE 0.53* 0.71 0.76 0.80  CALCIUM 9.3 9.7 9.3 9.6   GFR: Estimated Creatinine Clearance: 120.9 mL/min (by C-G formula based on SCr of 0.8 mg/dL). Liver Function Tests: Recent Labs  Lab 07/23/20 0343 07/25/20 1308  AST 47* 47*  ALT 69* 64*  ALKPHOS 164* 162*  BILITOT 0.5 0.7  PROT 7.4 8.2*  ALBUMIN 2.0* 2.3*   No results for input(s): LIPASE, AMYLASE in the last 168 hours. No results for input(s): AMMONIA in the last 168 hours. Coagulation Profile: No results for input(s): INR, PROTIME in the last 168 hours. Cardiac Enzymes: No results for input(s): CKTOTAL, CKMB, CKMBINDEX, TROPONINI in the last  168 hours. BNP (last 3 results) No results for input(s): PROBNP in the last 8760 hours. HbA1C: No results for input(s): HGBA1C in the last 72 hours. CBG: Recent Labs  Lab 07/26/20 0421  GLUCAP 170*   Lipid Profile: No results for input(s): CHOL, HDL, LDLCALC, TRIG, CHOLHDL, LDLDIRECT in the last 72 hours. Thyroid Function Tests: Recent Labs    07/26/20 0058  TSH 0.841  FREET4 0.61   Anemia Panel: Recent Labs    07/26/20 0058  VITAMINB12 1,131*  FOLATE 15.7  FERRITIN 368*  TIBC 245*  IRON 32*  RETICCTPCT 3.4*   Sepsis Labs: Recent Labs  Lab 07/25/20 1319 07/25/20 1533 07/25/20 2205 07/26/20 0318  PROCALCITON <0.10  --   --   --   LATICACIDVEN  --  1.1 1.4 1.5    Recent Results (from the past 240 hour(s))  Culture, Urine     Status: Abnormal   Collection Time: 07/19/20  7:35 PM   Specimen: Urine, Random  Result Value Ref Range Status   Specimen Description URINE, RANDOM  Final   Special Requests   Final    NONE Performed at [facility], [place] [address], [place], [place] [address]    Culture >=100,000 COLONIES/mL PSEUDOMONAS AERUGINOSA (A)  Final   Report Status 07/22/2020 FINAL  Final   Organism ID, Bacteria PSEUDOMONAS AERUGINOSA (A)  Final      Susceptibility   Pseudomonas aeruginosa - MIC*    CEFTAZIDIME 4 SENSITIVE Sensitive     CIPROFLOXACIN <=0.25 SENSITIVE Sensitive     GENTAMICIN 2 SENSITIVE Sensitive     IMIPENEM 2 SENSITIVE Sensitive     PIP/TAZO 16 SENSITIVE Sensitive     CEFEPIME 4 SENSITIVE Sensitive     * >=100,000 COLONIES/mL PSEUDOMONAS AERUGINOSA  Respiratory Panel by RT PCR (Flu A&B, Covid) - Nasopharyngeal Swab  Status: None   Collection Time: 07/23/20  3:23 AM   Specimen: Nasopharyngeal Swab  Result Value Ref Range Status   SARS Coronavirus 2 by RT PCR NEGATIVE NEGATIVE Final    Comment: (NOTE) SARS-CoV-2 target nucleic acids are NOT DETECTED.  The SARS-CoV-2 RNA is generally detectable in upper respiratoy specimens  during the acute phase of infection. The lowest concentration of SARS-CoV-2 viral copies this assay can detect is 131 copies/mL. A negative result does not preclude SARS-Cov-2 infection and should not be used as the sole basis for treatment or other patient management decisions. A negative result may occur with  improper specimen collection/handling, submission of specimen other than nasopharyngeal swab, presence of viral mutation(s) within the areas targeted by this assay, and inadequate number of viral copies (<131 copies/mL). A negative result must be combined with clinical observations, patient history, and epidemiological information. The expected result is Negative.  Fact Sheet for Patients:  PinkCheek.be  Fact Sheet for Healthcare Providers:  GravelBags.it  This test is no t yet approved or cleared by the [place] FDA and  has been authorized for detection and/or diagnosis of SARS-CoV-2 by FDA under an Emergency Use Authorization (EUA). This EUA will remain  in effect (meaning this test can be used) for the duration of the COVID-19 declaration under Section 564(b)(1) of the Act, 21 U.S.C. section 360bbb-3(b)(1), unless the authorization is terminated or revoked sooner.     Influenza A by PCR NEGATIVE NEGATIVE Final   Influenza B by PCR NEGATIVE NEGATIVE Final    Comment: (NOTE) The Xpert Xpress SARS-CoV-2/FLU/RSV assay is intended as an aid in  the diagnosis of influenza from Nasopharyngeal swab specimens and  should not be used as a sole basis for treatment. Nasal washings and  aspirates are unacceptable for Xpert Xpress SARS-CoV-2/FLU/RSV  testing.  Fact Sheet for Patients: PinkCheek.be  Fact Sheet for Healthcare Providers: GravelBags.it  This test is not yet approved or cleared by the [place] FDA and  has been authorized for detection and/or  diagnosis of SARS-CoV-2 by  FDA under an Emergency Use Authorization (EUA). This EUA will remain  in effect (meaning this test can be used) for the duration of the  Covid-19 declaration under Section 564(b)(1) of the Act, 21  U.S.C. section 360bbb-3(b)(1), unless the authorization is  terminated or revoked. Performed at [facility], [address] [place], [place] [address]   Respiratory Panel by RT PCR (Flu A&B, Covid) - Nasopharyngeal Swab     Status: None   Collection Time: 07/25/20  2:11 PM   Specimen: Nasopharyngeal Swab  Result Value Ref Range Status   SARS Coronavirus 2 by RT PCR NEGATIVE NEGATIVE Final    Comment: (NOTE) SARS-CoV-2 target nucleic acids are NOT DETECTED.  The SARS-CoV-2 RNA is generally detectable in upper respiratoy specimens during the acute phase of infection. The lowest concentration of SARS-CoV-2 viral copies this assay can detect is 131 copies/mL. A negative result does not preclude SARS-Cov-2 infection and should not be used as the sole basis for treatment or other patient management decisions. A negative result may occur with  improper specimen collection/handling, submission of specimen other than nasopharyngeal swab, presence of viral mutation(s) within the areas targeted by this assay, and inadequate number of viral copies (<131 copies/mL). A negative result must be combined with clinical observations, patient history, and epidemiological information. The expected result is Negative.  Fact Sheet for Patients:  PinkCheek.be  Fact Sheet for Healthcare Providers:  GravelBags.it  This  test is no t yet approved or cleared by the [place] and  has been authorized for detection and/or diagnosis of SARS-CoV-2 by FDA under an Emergency Use Authorization (EUA). This EUA will remain  in effect (meaning this test can be used) for the duration of the COVID-19 declaration under  Section 564(b)(1) of the Act, 21 U.S.C. section 360bbb-3(b)(1), unless the authorization is terminated or revoked sooner.     Influenza A by PCR NEGATIVE NEGATIVE Final   Influenza B by PCR NEGATIVE NEGATIVE Final    Comment: (NOTE) The Xpert Xpress SARS-CoV-2/FLU/RSV assay is intended as an aid in  the diagnosis of influenza from Nasopharyngeal swab specimens and  should not be used as a sole basis for treatment. Nasal washings and  aspirates are unacceptable for Xpert Xpress SARS-CoV-2/FLU/RSV  testing.  Fact Sheet for Patients: PinkCheek.be  Fact Sheet for Healthcare Providers: GravelBags.it  This test is not yet approved or cleared by the [place] FDA and  has been authorized for detection and/or diagnosis of SARS-CoV-2 by  FDA under an Emergency Use Authorization (EUA). This EUA will remain  in effect (meaning this test can be used) for the duration of the  Covid-19 declaration under Section 564(b)(1) of the Act, 21  U.S.C. section 360bbb-3(b)(1), unless the authorization is  terminated or revoked. Performed at [facility], [address] [place], [place] [address]   Blood culture (routine x 2)     Status: None (Preliminary result)   Collection Time: 07/25/20  3:34 PM   Specimen: BLOOD  Result Value Ref Range Status   Specimen Description BLOOD BLOOD RIGHT FOREARM  Final   Special Requests   Final    BOTTLES DRAWN AEROBIC AND ANAEROBIC Blood Culture adequate volume   Culture   Final    NO GROWTH 3 DAYS Performed at [facility], [address] [place], [place] [address]    Report Status PENDING  Incomplete  Blood culture (routine x 2)     Status: None (Preliminary result)   Collection Time: 07/25/20  3:34 PM   Specimen: BLOOD  Result Value Ref Range Status   Specimen Description BLOOD BLOOD LEFT FOREARM  Final   Special Requests   Final    BOTTLES DRAWN AEROBIC AND ANAEROBIC Blood  Culture adequate volume   Culture   Final    NO GROWTH 3 DAYS Performed at [facility], [address], [place], [place] [address]    Report Status PENDING  Incomplete  Culture, respiratory (non-expectorated)     Status: None   Collection Time: 07/25/20  6:36 PM   Specimen: Tracheal Aspirate; Respiratory  Result Value Ref Range Status   Specimen Description   Final    TRACHEAL ASPIRATE Performed at [facility], [address], [place], [place] [address]    Special Requests   Final    NONE Performed at [facility], [address] [place], [place] [address]    Gram Stain   Final    MODERATE WBC PRESENT,BOTH PMN AND MONONUCLEAR FEW GRAM POSITIVE COCCI IN CHAINS FEW GRAM POSITIVE RODS RARE GRAM NEGATIVE RODS    Culture   Final    FEW Normal respiratory flora-no Staph aureus or Pseudomonas seen Performed at [facility], [place] [address] [place], [place] [address]    Report Status 07/28/2020 FINAL  Final  MRSA PCR Screening     Status: None   Collection Time: 07/26/20  4:51 AM  Specimen: Nasopharyngeal  Result Value Ref Range Status   MRSA by PCR NEGATIVE NEGATIVE Final    Comment:        The GeneXpert MRSA Assay (FDA approved for NASAL specimens only), is one component of a comprehensive MRSA colonization surveillance program. It is not intended to diagnose MRSA infection nor to guide or monitor treatment for MRSA infections. Performed at [facility], [address] [place], [place] [address]       Imaging Studies   DG Abd Portable 1V  Result Date: 07/28/2020 CLINICAL DATA:  Abdominal distension. EXAM: PORTABLE ABDOMEN - 1 VIEW COMPARISON:  06/28/2020 FINDINGS: Left upper quadrant gastrostomy tube is identified. There is mild gaseous distension of the transverse colon and descending colon. No dilated loops of small bowel. Desiccated stool noted within the rectum. No radio-opaque calculi or other significant  radiographic abnormality are seen. IMPRESSION: 1. Nonobstructive bowel gas pattern. 2. Desiccated stool within the rectum. Correlate for any clinical signs or symptoms of fecal impaction. Electronically Signed   By: Kerby Moors M.D.   On: 07/28/2020 10:40     Medications   Scheduled Meds: . ascorbic acid  500 mg Per Tube BID  . baclofen  5 mg Per Tube TID  . bisacodyl  10 mg Rectal QHS  . chlorhexidine  15 mL Mouth Rinse BID  . Chlorhexidine Gluconate Cloth  6 each Topical Daily  . collagenase   Topical Daily  . enoxaparin (LOVENOX) injection  40 mg Subcutaneous Q24H  . famotidine  20 mg Per Tube Daily  . feeding supplement (OSMOLITE 1.5 CAL)  474 mL Per Tube QID  . feeding supplement (PROSource TF)  45 mL Per Tube BID  . ferrous sulfate  75 mg Per Tube Daily  . fludrocortisone  0.2 mg Per Tube Daily  . FLUoxetine  40 mg Per Tube Daily  . free water  400 mL Per Tube Q4H  . guaiFENesin  20 mL Per Tube Q6H  . lidocaine  1 patch Transdermal Q24H  . mouth rinse  15 mL Mouth Rinse q12n4p  . midodrine  10 mg Per Tube TID WC  . multivitamin  5 mL Per Tube Daily  . nutrition supplement (JUVEN)  1 packet Per Tube BID BM  . polycarbophil  625 mg Per Tube Daily  . saccharomyces boulardii  250 mg Per Tube BID  . scopolamine  1 patch Transdermal Q72H  . simethicone  80 mg Per Tube QID  . traZODone  50 mg Per Tube QHS   Continuous Infusions: . sodium chloride Stopped (07/25/20 2347)  . ampicillin-sulbactam (UNASYN) IV Stopped (07/28/20 1147)  . dextrose 50 mL/hr at 07/28/20 1500       LOS: 3 days    Time spent: 30 minutes with > 50% spent in coordination of care and direct patient contact.    Ezekiel Slocumb, DO [facility]  07/28/2020, 4:26 PM    If 7PM-7AM, please contact night-coverage. How to contact the [facility] Attending or Consulting provider [place] or covering provider during after hours [place], for this patient?    1. Check the care team in [facility] and look for  a) attending/consulting [facility] provider listed and b) the [facility] team listed 2. Log into [email or web address] and use [place]'s universal password to access. If you do not have the password, please contact the hospital operator. 3. Locate the [facility] provider you are looking for under [facility] and page to a number that you can  be directly reached. 4. If you still have difficulty reaching the provider, please page the [facility] (Director on Call) for the Hospitalists listed on amion for assistance.

## 2020-07-28 NOTE — Telephone Encounter (Signed)
Ryan Day would like a call back. She would like to discuss Ryan Day recent diagnoses of a colaspase lung. Call back ph# [phone].

## 2020-07-28 NOTE — Progress Notes (Signed)
OT Cancellation Note  Patient Details Name: Ryan Day MRN: [id number] DOB: 04-19-1951   Cancelled Treatment:    Reason Eval/Treat Not Completed: OT screened, no needs identified, will sign off. Provided pt with shoulder-activated call bell. Notified floor staff re: call bell positioning and pt's need for frequent suctioning.  Josiah Lobo, PhD, [place], OTR/L ascom [phone] 07/28/20, 3:09 PM

## 2020-07-28 NOTE — Progress Notes (Signed)
Daily Progress Note   Patient Name: Ryan Day       Date: 07/28/2020 DOB: 1951-06-02  Age: 69 y.o. MRN#: [id number] Attending Physician: Ezekiel Slocumb, DO Primary Care Physician: Valerie Roys, DO Admit Date: 07/25/2020  Reason for Consultation/Follow-up: Establishing goals of care  Subjective: Patient is resting in bed. Discussed concerns for swallowing and aspiration PNA. He states he understands he could develop PNA again, and potentially have frequent hospitalizations.  Patient states he wants to do what he can to try to live as long as he can. He states if needed, he would live on a ventilator via his tracheostomy. He states he does not have pain, and does not feel he is suffering. He understands a comfort path, as well as full scope care. He states he will continue conversations with his family himself.   Length of Stay: 3  Current Medications: Scheduled Meds:  . ascorbic acid  500 mg Per Tube BID  . baclofen  5 mg Per Tube TID  . bisacodyl  10 mg Rectal QHS  . chlorhexidine  15 mL Mouth Rinse BID  . Chlorhexidine Gluconate Cloth  6 each Topical Daily  . collagenase   Topical Daily  . enoxaparin (LOVENOX) injection  40 mg Subcutaneous Q24H  . famotidine  20 mg Per Tube Daily  . feeding supplement (OSMOLITE 1.5 CAL)  474 mL Per Tube QID  . feeding supplement (PROSource TF)  45 mL Per Tube BID  . ferrous sulfate  75 mg Per Tube Daily  . fludrocortisone  0.2 mg Per Tube Daily  . FLUoxetine  40 mg Per Tube Daily  . free water  400 mL Per Tube Q4H  . guaiFENesin  20 mL Per Tube Q6H  . lidocaine  1 patch Transdermal Q24H  . mouth rinse  15 mL Mouth Rinse q12n4p  . midodrine  10 mg Per Tube TID WC  . multivitamin  5 mL Per Tube Daily  . nutrition supplement (JUVEN)  1 packet  Per Tube BID BM  . polycarbophil  625 mg Per Tube Daily  . saccharomyces boulardii  250 mg Per Tube BID  . scopolamine  1 patch Transdermal Q72H  . simethicone  80 mg Per Tube QID  . traZODone  50 mg Per Tube QHS    Continuous Infusions: .  sodium chloride Stopped (07/25/20 2347)  . ampicillin-sulbactam (UNASYN) IV Stopped (07/28/20 1147)  . dextrose 50 mL/hr at 07/28/20 1200    PRN Meds: acetaminophen, hydrocortisone cream  Physical Exam Pulmonary:     Effort: Pulmonary effort is normal.  Neurological:     Mental Status: He is alert.             Vital Signs: BP (!) 104/59 (BP Location: Left Arm)   Pulse 93   Temp 98.2 F (36.8 C) (Oral)   Resp 16   Ht 6\' 5"  (1.956 m)   Wt 108 kg   SpO2 99%   BMI 28.23 kg/m  SpO2: SpO2: 99 % O2 Device: O2 Device: Nasal Cannula O2 Flow Rate: O2 Flow Rate (L/min): 2 L/min  Intake/output summary:   Intake/Output Summary (Last 24 hours) at 07/28/2020 1346 Last data filed at 07/28/2020 1200 Gross per 24 hour  Intake 1782.46 ml  Output 3502 ml  Net -1719.54 ml   LBM: Last BM Date: 07/28/20 Baseline Weight: Weight: 108 kg Most recent weight: Weight: 108 kg       Palliative Assessment/Data:      Patient Active Problem List   Diagnosis Date Noted  . Goals of care, counseling/discussion   . Sacral decubitus ulcer, stage II ([place]) 07/25/2020  . Aspiration pneumonia ([place]) 07/25/2020  . Palliative care by specialist   . Salivary secretion   . Spasticity   . Status post tracheostomy ([place])   . Spinal cord injury, cervical region, sequela ([place]) 06/23/2020  . Pressure injury of skin 06/23/2020  . S/P percutaneous endoscopic gastrostomy (PEG) tube placement ([place])   . Tracheostomy in place [facility])   . Quadriplegia ([place])   . Neurogenic orthostatic hypotension ([place])   . Acute on chronic anemia   . Hypernatremia   . Neurogenic bladder   . Neurogenic bowel   . Acute on chronic respiratory failure with hypoxia ([place])   . Autonomic  instability   . Cardiac arrest ([place])   . Cervical spinal cord injury, sequela ([place])   . Acute renal injury due to hypovolemia ([place])   . Family history of stomach cancer   . Exercise-induced leg cramps 05/13/2017  . Elevated alkaline phosphatase level 03/13/2015  . Hyperlipidemia   . Hypertension     Palliative Care Assessment & Plan    Recommendations/Plan: Palliative at D/C.     Code Status:    Code Status Orders  (From admission, onward)         Start     Ordered   07/25/20 2244  Full code  Continuous        07/25/20 2244        Code Status History    Date Active Date Inactive Code Status Order ID Comments User Context   06/23/2020 1817 07/20/2020 1604 Full Code [id number]  Flora Lipps Inpatient   06/08/2020 0731 06/23/2020 1640 Full Code [id number]  Esmond Camper Inpatient   Advance Care Planning Activity      Prognosis:  Poor overall.    Thank you for allowing the Palliative Medicine Team to assist in the care of this patient.   Total Time 15 min Prolonged Time Billed no      Greater than 50%  of this time was spent counseling and coordinating care related to the above assessment and plan.  Asencion Gowda, NP  Please contact Palliative Medicine Team phone at [phone] for questions and concerns.

## 2020-07-28 NOTE — Plan of Care (Signed)
Continuing with plan of care. 

## 2020-07-28 NOTE — Progress Notes (Signed)
[facility]) Hospital Liaison RN note:  This patient is currently followed by out patient based palliative care with [facility]. Dayton Scrape, TOC is aware. We will follow for disposition and update team.  Thank you.  Zandra Abts, RN [facility] Liaison [phone]

## 2020-07-28 NOTE — Telephone Encounter (Signed)
At [facility] For aspiration pneumonia B/L There for a few doctors to see him next week-  But wife wondering if could come see him there.    Getting ramp Monday.  Had a good BM finally.  Was really constipated.   Make sure they do bowel program nightly- at least suppository nightly.   Doing OK otherwise.

## 2020-07-29 LAB — MAGNESIUM: Magnesium: 2.3 mg/dL (ref 1.7–2.4)

## 2020-07-29 LAB — CBC WITH DIFFERENTIAL/PLATELET
Abs Immature Granulocytes: 0.06 10*3/uL (ref 0.00–0.07)
Basophils Absolute: 0 10*3/uL (ref 0.0–0.1)
Basophils Relative: 0 %
Eosinophils Absolute: 0.2 10*3/uL (ref 0.0–0.5)
Eosinophils Relative: 2 %
HCT: 22.8 % — ABNORMAL LOW (ref 39.0–52.0)
Hemoglobin: 7 g/dL — ABNORMAL LOW (ref 13.0–17.0)
Immature Granulocytes: 1 %
Lymphocytes Relative: 12 %
Lymphs Abs: 1.2 10*3/uL (ref 0.7–4.0)
MCH: 27.9 pg (ref 26.0–34.0)
MCHC: 30.7 g/dL (ref 30.0–36.0)
MCV: 90.8 fL (ref 80.0–100.0)
Monocytes Absolute: 1.3 10*3/uL — ABNORMAL HIGH (ref 0.1–1.0)
Monocytes Relative: 14 %
Neutro Abs: 6.6 10*3/uL (ref 1.7–7.7)
Neutrophils Relative %: 71 %
Platelets: 322 10*3/uL (ref 150–400)
RBC: 2.51 MIL/uL — ABNORMAL LOW (ref 4.22–5.81)
RDW: 17.3 % — ABNORMAL HIGH (ref 11.5–15.5)
WBC: 9.4 10*3/uL (ref 4.0–10.5)
nRBC: 0.2 % (ref 0.0–0.2)

## 2020-07-29 LAB — BASIC METABOLIC PANEL
Anion gap: 9 (ref 5–15)
BUN: 23 mg/dL (ref 8–23)
CO2: 27 mmol/L (ref 22–32)
Calcium: 9.4 mg/dL (ref 8.9–10.3)
Chloride: 108 mmol/L (ref 98–111)
Creatinine, Ser: 0.86 mg/dL (ref 0.61–1.24)
GFR, Estimated: >60 mL/min (ref >60)
Glucose, Bld: 117 mg/dL — ABNORMAL HIGH (ref 70–99)
Potassium: 4 mmol/L (ref 3.5–5.1)
Sodium: 144 mmol/L (ref 135–145)

## 2020-07-29 LAB — PROCALCITONIN: Procalcitonin: <0.1 ng/mL

## 2020-07-29 MED ORDER — SODIUM CHLORIDE 0.9 % IV SOLN
300.0000 mg | Freq: Once | INTRAVENOUS | Status: AC
  Administered 2020-07-29: 300 mg via INTRAVENOUS
  Filled 2020-07-29: qty 15

## 2020-07-29 MED ORDER — OSMOLITE 1.5 CAL PO LIQD
237.0000 mL | ORAL | Status: DC
  Administered 2020-07-29 – 2020-08-02 (×23): 237 mL

## 2020-07-29 NOTE — Plan of Care (Signed)
Continuing with plan of care. 

## 2020-07-29 NOTE — Progress Notes (Signed)
PROGRESS NOTE    Ryan Day   QMV:[id number]  DOB: [birth year]  PCP: Valerie Roys, DO    DOA: 07/25/2020 LOS: 4   Brief Narrative   HPI on admission: "Ryan Day is a 69 y.o. 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 After fall pt was managed at [facility] in [place] for 2 months  And has rehab at [facility] [place]. Pt had  Trach and peg during [place] stay . Pt is alert and awake and oriented x 3. Pt has been coughing and copious amount of sputum. "  Admitted to hospitalist service with aspiration pneumonia.     Assessment & Plan   Principal Problem:   Aspiration pneumonia ([place]) Active Problems:   Hypertension   Quadriplegia ([place])   Sacral decubitus ulcer, stage II (HCC)   Goals of care, counseling/discussion   Aspiration pneumonia - present on admission with SOB, copious sputum production, difficulty clearing secretions.  Chest xray consistent with aspiration PNA. 11/4 - continues to have copious thick secretions, but none coming out or around trach. 11/5 - tachypnea and hypoxic this morning, placed on nasal cannula 11/6 - fever 8:30 PM last night, but procal is <0.10.  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  Secretions continue to be clear and non-purulent. --Continue Unasyn --Aspiration precautions --Diligent oral care --SLP evaluation --RT following --PRN Tylenol if fevers --PRN antitussives --scopolamine patch to dry up secretions --continue close monitoring in stepdown  Hypernatremia - Resolved 11/6.  Continue free water flushes at 400cc q4h.  Stop D5W fluids.  Hypotension - likely multifactorial due to sepsis/infection and hypovolemia, and replated to autonominc dysfunction with spinal cord injury.  Family report hypotension at home. --continue Florinef and  midodrine  Iron deficiency anemia - monitor CBC.  Transfuse for Hbg < 7.0.  Continue iron supplement.  Venofer infusion today (11/6). Hbg today is 7.0, no evidence of bleeding.  Low iron on anemia panel despite PO supplement.  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   --Tube feeds with free water flushes per orders. If abdominal distention, decrease volume & increase frequent of feeds - see order comments for details. --Routine trach care --Continue baclofen --Diligent bowel regimen - suppository and manual stimulation at bedtime.  PRN suppository if needed during day.  Sacral decubitus ulcer, stage II  - POA.  WOC following.  Due to quadriplegic status and immobillity.   --Reposition patient at least every 2 hours. Pressure Injury 07/26/20 Sacrum Unstageable - Full thickness tissue loss in which the base of the injury is covered by slough (yellow, tan, gray, green or brown) and/or eschar (tan, brown or black) in the wound bed. (Active)  07/26/20 0513  Location: Sacrum  Location Orientation:   Staging: Unstageable - Full thickness tissue loss in which the base of the injury is covered by slough (yellow, tan, gray, green or brown) and/or eschar (tan, brown or black) in the wound bed.  Wound Description (Comments):   Present on Admission: Yes    Hx of Hypertension - not on antihypertensives, but on Florinef  Hx of depression - continue Prozac  Right shoulder pain - chronic.  Lidoderm patches.     DVT prophylaxis: enoxaparin (LOVENOX) injection 40 mg Start: 07/26/20 1000 SCDs Start: 07/25/20 2243   Diet:  Diet Orders (From admission,  onward)    Start     Ordered   07/25/20 2244  Diet NPO time specified  Diet effective now        07/25/20 2244            Code Status: Full Code    Subjective 07/29/20    Pt seen this AM with RN at bedside.  He had a fever of 101.1 F around 8:30 PM last night.  RN reports copious secretions,  now also coming out around trach site.  Wife reported this at home, but has not been issue here thus far.  Still says his belly feels tight, band/belt-like feeling like yesterday.  Has been having BM's since bowel regimen started, wife reportedly also did manual stimulation.  Pt denies pain, fever/chills at this time.  Now has a shoulder-activated call bell, and wife brought in a device so Ryan Day suction can be placed in front of patient's mouth for self suctioning.     Disposition Plan & Communication   Status is: Inpatient  Inpatient remains appropriate status due to severity of illness requiring IV therapies as above.  Dispo: The patient is from: Home              Anticipated d/c is to: Home              Anticipated d/c date is: 2-3 days              Patient currently is not medically stable for d/c.  Family Communication: wife not at bedside during encounter this AM.  Will attempt to call this afternoon.   Consults, Procedures, Significant Events   Consultants:   [place]  Palliative care  Procedures:   none  Antimicrobials:  Anti-infectives (From admission, onward)   Start     Dose/Rate Route Frequency Ordered Stop   07/26/20 1600  Ampicillin-Sulbactam (UNASYN) 3 g in sodium chloride 0.9 % 100 mL IVPB        3 g 200 mL/hr over 30 Minutes Intravenous Every 6 hours 07/26/20 1130     07/26/20 1200  vancomycin (VANCOCIN) IVPB 1000 mg/200 mL premix  Status:  Discontinued        1,000 mg 200 mL/hr over 60 Minutes Intravenous Every 12 hours 07/25/20 2304 07/26/20 1106   07/25/20 2315  vancomycin (VANCOREADY) IVPB 2000 mg/400 mL       "Followed by" Linked Group Details   2,000 mg 200 mL/hr over 120 Minutes Intravenous  Once 07/25/20 2301 07/26/20 0205   07/25/20 2315  vancomycin (VANCOREADY) IVPB 500 mg/100 mL       "Followed by" Linked Group Details   500 mg 100 mL/hr over 60 Minutes Intravenous  Once 07/25/20 2301 07/26/20 0323   07/25/20 1500  piperacillin-tazobactam (ZOSYN)  IVPB 3.375 g  Status:  Discontinued        3.375 g 12.5 mL/hr over 240 Minutes Intravenous Every 8 hours 07/25/20 1446 07/26/20 1129   07/25/20 1430  Ampicillin-Sulbactam (UNASYN) 3 g in sodium chloride 0.9 % 100 mL IVPB  Status:  Discontinued        3 g 200 mL/hr over 30 Minutes Intravenous  Once 07/25/20 1418 07/25/20 1446        Objective   Vitals:   07/28/20 2027 07/28/20 2351 07/29/20 0618 07/29/20 0800  BP: 133/71 (!) 114/57 126/71 (!) 115/56  Pulse: 89 95 89 89  Resp: 20 (!) 24 16 18   Temp: (!) 101.1 F (38.4 C) 98.2 F (36.8 C) 99.1  F (37.3 C) 99 F (37.2 C)  TempSrc:    Oral  SpO2: 100% 100% 100% 98%  Weight:      Height:        Intake/Output Summary (Last 24 hours) at 07/29/2020 1453 Last data filed at 07/29/2020 1445 Gross per 24 hour  Intake 2536 ml  Output 4650 ml  Net -2114 ml   Filed Weights   07/25/20 1300  Weight: 108 kg    Physical Exam:  General exam: sleeping, arouses to voice, no acute distress HEENT: trach in place capped, hearing grossly normal, moist mucus membranes  Respiratory system: continues to have referred upper airway secretion sounds diffusely, unable to appreciate lower rhonchi, no wheezes, normal respiratory effort, on 2 L/min nasal cannula oxygen. Cardiovascular system: normal S1/S2, RRR, no pedal edema.   Gastrointestinal system: soft, NT, mild distention, PEG tube in place    Labs   Data Reviewed: I have personally reviewed following labs and imaging studies  CBC: Recent Labs  Lab 07/23/20 0343 07/25/20 1308 07/27/20 0726 07/28/20 0605 07/29/20 0713  WBC 7.1 8.9 8.5 7.6 9.4  NEUTROABS  --  5.9 5.8  --  6.6  HGB 8.2* 8.6* 7.9* 7.1* 7.0*  HCT 25.4* 27.1* 25.6* 23.1* 22.8*  MCV 87.9 88.6 89.2 89.2 90.8  PLT 378 421* 321 409* 485   Basic Metabolic Panel: Recent Labs  Lab 07/23/20 0343 07/25/20 1308 07/27/20 0726 07/28/20 0605 07/29/20 0713  NA 137 141 148* 149* 144  K 3.6 4.1 3.8 3.7 4.0  CL 100 104 112*  113* 108  CO2 28 28 27 26 27   GLUCOSE 103* 72 102* 97 117*  BUN 21 26* 26* 27* 23  CREATININE 0.53* 0.71 0.76 0.80 0.86  CALCIUM 9.3 9.7 9.3 9.6 9.4  MG  --   --   --   --  2.3   GFR: Estimated Creatinine Clearance: 112.4 mL/min (by C-G formula based on SCr of 0.86 mg/dL). Liver Function Tests: Recent Labs  Lab 07/23/20 0343 07/25/20 1308  AST 47* 47*  ALT 69* 64*  ALKPHOS 164* 162*  BILITOT 0.5 0.7  PROT 7.4 8.2*  ALBUMIN 2.0* 2.3*   No results for input(s): LIPASE, AMYLASE in the last 168 hours. No results for input(s): AMMONIA in the last 168 hours. Coagulation Profile: No results for input(s): INR, PROTIME in the last 168 hours. Cardiac Enzymes: No results for input(s): CKTOTAL, CKMB, CKMBINDEX, TROPONINI in the last 168 hours. BNP (last 3 results) No results for input(s): PROBNP in the last 8760 hours. HbA1C: No results for input(s): HGBA1C in the last 72 hours. CBG: Recent Labs  Lab 07/26/20 0421  GLUCAP 170*   Lipid Profile: No results for input(s): CHOL, HDL, LDLCALC, TRIG, CHOLHDL, LDLDIRECT in the last 72 hours. Thyroid Function Tests: No results for input(s): TSH, T4TOTAL, FREET4, T3FREE, THYROIDAB in the last 72 hours. Anemia Panel: No results for input(s): VITAMINB12, FOLATE, FERRITIN, TIBC, IRON, RETICCTPCT in the last 72 hours. Sepsis Labs: Recent Labs  Lab 07/25/20 1319 07/25/20 1533 07/25/20 2205 07/26/20 0318 07/29/20 0713  PROCALCITON <0.10  --   --   --  <0.10  LATICACIDVEN  --  1.1 1.4 1.5  --     Recent Results (from the past 240 hour(s))  Culture, Urine     Status: Abnormal   Collection Time: 07/19/20  7:35 PM   Specimen: Urine, Random  Result Value Ref Range Status   Specimen Description URINE, RANDOM  Final   Special  Requests   Final    NONE Performed at [facility], [place] [address], [place], [place] [address]    Culture >=100,000 COLONIES/mL PSEUDOMONAS AERUGINOSA (A)  Final   Report Status 07/22/2020 FINAL  Final    Organism ID, Bacteria PSEUDOMONAS AERUGINOSA (A)  Final      Susceptibility   Pseudomonas aeruginosa - MIC*    CEFTAZIDIME 4 SENSITIVE Sensitive     CIPROFLOXACIN <=0.25 SENSITIVE Sensitive     GENTAMICIN 2 SENSITIVE Sensitive     IMIPENEM 2 SENSITIVE Sensitive     PIP/TAZO 16 SENSITIVE Sensitive     CEFEPIME 4 SENSITIVE Sensitive     * >=100,000 COLONIES/mL PSEUDOMONAS AERUGINOSA  Respiratory Panel by RT PCR (Flu A&B, Covid) - Nasopharyngeal Swab     Status: None   Collection Time: 07/23/20  3:23 AM   Specimen: Nasopharyngeal Swab  Result Value Ref Range Status   SARS Coronavirus 2 by RT PCR NEGATIVE NEGATIVE Final    Comment: (NOTE) SARS-CoV-2 target nucleic acids are NOT DETECTED.  The SARS-CoV-2 RNA is generally detectable in upper respiratoy specimens during the acute phase of infection. The lowest concentration of SARS-CoV-2 viral copies this assay can detect is 131 copies/mL. A negative result does not preclude SARS-Cov-2 infection and should not be used as the sole basis for treatment or other patient management decisions. A negative result may occur with  improper specimen collection/handling, submission of specimen other than nasopharyngeal swab, presence of viral mutation(s) within the areas targeted by this assay, and inadequate number of viral copies (<131 copies/mL). A negative result must be combined with clinical observations, patient history, and epidemiological information. The expected result is Negative.  Fact Sheet for Patients:  PinkCheek.be  Fact Sheet for Healthcare Providers:  GravelBags.it  This test is no t yet approved or cleared by the [place] FDA and  has been authorized for detection and/or diagnosis of SARS-CoV-2 by FDA under an Emergency Use Authorization (EUA). This EUA will remain  in effect (meaning this test can be used) for the duration of the COVID-19 declaration under  Section 564(b)(1) of the Act, 21 U.S.C. section 360bbb-3(b)(1), unless the authorization is terminated or revoked sooner.     Influenza A by PCR NEGATIVE NEGATIVE Final   Influenza B by PCR NEGATIVE NEGATIVE Final    Comment: (NOTE) The Xpert Xpress SARS-CoV-2/FLU/RSV assay is intended as an aid in  the diagnosis of influenza from Nasopharyngeal swab specimens and  should not be used as a sole basis for treatment. Nasal washings and  aspirates are unacceptable for Xpert Xpress SARS-CoV-2/FLU/RSV  testing.  Fact Sheet for Patients: PinkCheek.be  Fact Sheet for Healthcare Providers: GravelBags.it  This test is not yet approved or cleared by the [place] FDA and  has been authorized for detection and/or diagnosis of SARS-CoV-2 by  FDA under an Emergency Use Authorization (EUA). This EUA will remain  in effect (meaning this test can be used) for the duration of the  Covid-19 declaration under Section 564(b)(1) of the Act, 21  U.S.C. section 360bbb-3(b)(1), unless the authorization is  terminated or revoked. Performed at [facility], [address] [place],  [address]   Respiratory Panel by RT PCR (Flu A&B, Covid) - Nasopharyngeal Swab     Status: None   Collection Time: 07/25/20  2:11 PM   Specimen: Nasopharyngeal Swab  Result Value Ref Range Status   SARS Coronavirus 2 by RT PCR NEGATIVE NEGATIVE Final    Comment: (NOTE)  SARS-CoV-2 target nucleic acids are NOT DETECTED.  The SARS-CoV-2 RNA is generally detectable in upper respiratoy specimens during the acute phase of infection. The lowest concentration of SARS-CoV-2 viral copies this assay can detect is 131 copies/mL. A negative result does not preclude SARS-Cov-2 infection and should not be used as the sole basis for treatment or other patient management decisions. A negative result may occur with  improper specimen collection/handling,  submission of specimen other than nasopharyngeal swab, presence of viral mutation(s) within the areas targeted by this assay, and inadequate number of viral copies (<131 copies/mL). A negative result must be combined with clinical observations, patient history, and epidemiological information. The expected result is Negative.  Fact Sheet for Patients:  PinkCheek.be  Fact Sheet for Healthcare Providers:  GravelBags.it  This test is no t yet approved or cleared by the [place] FDA and  has been authorized for detection and/or diagnosis of SARS-CoV-2 by FDA under an Emergency Use Authorization (EUA). This EUA will remain  in effect (meaning this test can be used) for the duration of the COVID-19 declaration under Section 564(b)(1) of the Act, 21 U.S.C. section 360bbb-3(b)(1), unless the authorization is terminated or revoked sooner.     Influenza A by PCR NEGATIVE NEGATIVE Final   Influenza B by PCR NEGATIVE NEGATIVE Final    Comment: (NOTE) The Xpert Xpress SARS-CoV-2/FLU/RSV assay is intended as an aid in  the diagnosis of influenza from Nasopharyngeal swab specimens and  should not be used as a sole basis for treatment. Nasal washings and  aspirates are unacceptable for Xpert Xpress SARS-CoV-2/FLU/RSV  testing.  Fact Sheet for Patients: PinkCheek.be  Fact Sheet for Healthcare Providers: GravelBags.it  This test is not yet approved or cleared by the [place] FDA and  has been authorized for detection and/or diagnosis of SARS-CoV-2 by  FDA under an Emergency Use Authorization (EUA). This EUA will remain  in effect (meaning this test can be used) for the duration of the  Covid-19 declaration under Section 564(b)(1) of the Act, 21  U.S.C. section 360bbb-3(b)(1), unless the authorization is  terminated or revoked. Performed at [facility], [address] [place], [place] [address]   Blood culture (routine x 2)     Status: None (Preliminary result)   Collection Time: 07/25/20  3:34 PM   Specimen: BLOOD  Result Value Ref Range Status   Specimen Description BLOOD BLOOD RIGHT FOREARM  Final   Special Requests   Final    BOTTLES DRAWN AEROBIC AND ANAEROBIC Blood Culture adequate volume   Culture   Final    NO GROWTH 4 DAYS Performed at [facility], [address], [place], [place] [address]    Report Status PENDING  Incomplete  Blood culture (routine x 2)     Status: None (Preliminary result)   Collection Time: 07/25/20  3:34 PM   Specimen: BLOOD  Result Value Ref Range Status   Specimen Description BLOOD BLOOD LEFT FOREARM  Final   Special Requests   Final    BOTTLES DRAWN AEROBIC AND ANAEROBIC Blood Culture adequate volume   Culture   Final    NO GROWTH 4 DAYS Performed at [facility], [address], [place],  [address]    Report Status PENDING  Incomplete  Culture, respiratory (non-expectorated)     Status: None   Collection Time: 07/25/20  6:36 PM   Specimen: Tracheal Aspirate; Respiratory  Result Value Ref Range Status   Specimen Description  Final    TRACHEAL ASPIRATE Performed at [facility], [address] [place], [place] [address]    Special Requests   Final    NONE Performed at [facility], [address] [place], [place] [address]    Gram Stain   Final    MODERATE WBC PRESENT,BOTH PMN AND MONONUCLEAR FEW GRAM POSITIVE COCCI IN CHAINS FEW GRAM POSITIVE RODS RARE GRAM NEGATIVE RODS    Culture   Final    FEW Normal respiratory flora-no Staph aureus or Pseudomonas seen Performed at [facility], [place] [address] [place], [place] [address]    Report Status 07/28/2020 FINAL  Final  MRSA PCR Screening     Status: None   Collection Time: 07/26/20  4:51 AM   Specimen: Nasopharyngeal  Result Value Ref Range Status   MRSA by PCR NEGATIVE NEGATIVE  Final    Comment:        The GeneXpert MRSA Assay (FDA approved for NASAL specimens only), is one component of a comprehensive MRSA colonization surveillance program. It is not intended to diagnose MRSA infection nor to guide or monitor treatment for MRSA infections. Performed at [facility], [address], [place], [place] [address]       Imaging Studies   DG Abd Portable 1V  Result Date: 07/28/2020 CLINICAL DATA:  Abdominal distension. EXAM: PORTABLE ABDOMEN - 1 VIEW COMPARISON:  06/28/2020 FINDINGS: Left upper quadrant gastrostomy tube is identified. There is mild gaseous distension of the transverse colon and descending colon. No dilated loops of small bowel. Desiccated stool noted within the rectum. No radio-opaque calculi or other significant radiographic abnormality are seen. IMPRESSION: 1. Nonobstructive bowel gas pattern. 2. Desiccated stool within the rectum. Correlate for any clinical signs or symptoms of fecal impaction. Electronically Signed   By: Kerby Moors M.D.   On: 07/28/2020 10:40     Medications   Scheduled Meds: . ascorbic acid  500 mg Per Tube BID  . baclofen  5 mg Per Tube TID  . bisacodyl  10 mg Rectal QHS  . chlorhexidine  15 mL Mouth Rinse BID  . Chlorhexidine Gluconate Cloth  6 each Topical Daily  . collagenase   Topical Daily  . enoxaparin (LOVENOX) injection  40 mg Subcutaneous Q24H  . famotidine  20 mg Per Tube Daily  . feeding supplement (OSMOLITE 1.5 CAL)  474 mL Per Tube QID  . feeding supplement (PROSource TF)  45 mL Per Tube BID  . ferrous sulfate  75 mg Per Tube Daily  . fludrocortisone  0.2 mg Per Tube Daily  . FLUoxetine  40 mg Per Tube Daily  . free water  400 mL Per Tube Q4H  . guaiFENesin  20 mL Per Tube Q6H  . lidocaine  1 patch Transdermal Q24H  . mouth rinse  15 mL Mouth Rinse q12n4p  . midodrine  10 mg Per Tube TID WC  . multivitamin  5 mL Per Tube Daily  . nutrition supplement (JUVEN)  1 packet Per Tube BID BM   . polycarbophil  625 mg Per Tube Daily  . saccharomyces boulardii  250 mg Per Tube BID  . scopolamine  1 patch Transdermal Q72H  . simethicone  80 mg Per Tube QID  . traZODone  50 mg Per Tube QHS   Continuous Infusions: . sodium chloride Stopped (07/25/20 2347)  . ampicillin-sulbactam (UNASYN) IV Stopped (07/29/20 0931)       LOS: 4 days    Time spent:  25 minutes with > 50% spent in coordination of care and direct patient contact.    Ezekiel Slocumb, DO [facility]  07/29/2020, 2:53 PM    If 7PM-7AM, please contact night-coverage. How to contact the [facility] Attending or Consulting provider [place] or covering provider during after hours [place], for this patient?    1. Check the care team in [facility] and look for a) attending/consulting [facility] provider listed and b) the [facility] team listed 2. Log into [email or web address] and use [place]'s universal password to access. If you do not have the password, please contact the hospital operator. 3. Locate the [facility] provider you are looking for under [facility] and page to a number that you can be directly reached. 4. If you still have difficulty reaching the provider, please page the [facility] (Director on Call) for the Hospitalists listed on amion for assistance.

## 2020-07-29 NOTE — Progress Notes (Signed)
[place] encountered pt.'s wife in 2nd floor hallway; [place] stopped to speak w/wife; she shared that pt. suffered a fall several months ago in their home and broke his neck which has left him with 'incomplete' paralysis; per wife, pt. has been in good spirits: 'God's going to take care of [place],' he told her earlier this week.  Wife is struggling somewhat due to poor support from faith community and the overall stressors associated with being a key caregiver for her husband.  CH provided extended active listening and reflected w/pt. about the theological and emotional themes in her experience.  Wife grateful for support.  CH remains available as needed.

## 2020-07-30 LAB — CBC WITH DIFFERENTIAL/PLATELET
Abs Immature Granulocytes: 0.05 10*3/uL (ref 0.00–0.07)
Basophils Absolute: 0 10*3/uL (ref 0.0–0.1)
Basophils Relative: 0 %
Eosinophils Absolute: 0.3 10*3/uL (ref 0.0–0.5)
Eosinophils Relative: 3 %
HCT: 24.3 % — ABNORMAL LOW (ref 39.0–52.0)
Hemoglobin: 7.4 g/dL — ABNORMAL LOW (ref 13.0–17.0)
Immature Granulocytes: 1 %
Lymphocytes Relative: 16 %
Lymphs Abs: 1.4 10*3/uL (ref 0.7–4.0)
MCH: 27.3 pg (ref 26.0–34.0)
MCHC: 30.5 g/dL (ref 30.0–36.0)
MCV: 89.7 fL (ref 80.0–100.0)
Monocytes Absolute: 1.3 10*3/uL — ABNORMAL HIGH (ref 0.1–1.0)
Monocytes Relative: 15 %
Neutro Abs: 5.9 10*3/uL (ref 1.7–7.7)
Neutrophils Relative %: 65 %
Platelets: 315 10*3/uL (ref 150–400)
RBC: 2.71 MIL/uL — ABNORMAL LOW (ref 4.22–5.81)
RDW: 17.1 % — ABNORMAL HIGH (ref 11.5–15.5)
WBC: 8.9 10*3/uL (ref 4.0–10.5)
nRBC: 0.2 % (ref 0.0–0.2)

## 2020-07-30 LAB — BASIC METABOLIC PANEL
Anion gap: 8 (ref 5–15)
BUN: 23 mg/dL (ref 8–23)
CO2: 29 mmol/L (ref 22–32)
Calcium: 9.4 mg/dL (ref 8.9–10.3)
Chloride: 109 mmol/L (ref 98–111)
Creatinine, Ser: 0.86 mg/dL (ref 0.61–1.24)
GFR, Estimated: >60 mL/min (ref >60)
Glucose, Bld: 121 mg/dL — ABNORMAL HIGH (ref 70–99)
Potassium: 3.8 mmol/L (ref 3.5–5.1)
Sodium: 146 mmol/L — ABNORMAL HIGH (ref 135–145)

## 2020-07-30 MED ORDER — BACLOFEN 10 MG PO TABS
10.0000 mg | ORAL_TABLET | Freq: Three times a day (TID) | ORAL | Status: DC
  Administered 2020-07-30 – 2020-08-08 (×27): 10 mg
  Filled 2020-07-30 (×30): qty 1

## 2020-07-30 NOTE — Plan of Care (Signed)
Continuing with plan of care. 

## 2020-07-30 NOTE — TOC Progression Note (Signed)
Transition of Care [facility]) - Progression Note    Patient Details  Name: Ryan Day MRN: [id number] Date of Birth: 1951-02-22  [facility] CM/SW Contact  Izola Price, RN Phone Number: 07/30/2020, 3:07 PM  Clinical Narrative:    11/7 Notes indicated trach check scheduled by ENT. Secretions improving. Potential discharge Tuesday 08/01/20. Simmie Davies RN CM         Expected Discharge Plan and Services                                                 Social Determinants of Health (SDOH) Interventions    Readmission Risk Interventions No flowsheet data found.

## 2020-07-30 NOTE — Progress Notes (Signed)
Sx. For small amt. Of thin clear secretions. Pt. Tolerated well.

## 2020-07-30 NOTE — Progress Notes (Addendum)
PROGRESS NOTE    Ryan Day   WNU:[id number]  DOB: August 21, 1951  PCP: Valerie Roys, DO    DOA: 07/25/2020 LOS: 5   Brief Narrative   HPI on admission: "Ryan Day is a 69 y.o. 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 After fall pt was managed at [facility] in [place] for 2 months  And has rehab at [facility]. Pt had  Trach and peg during [facility] stay . Pt is alert and awake and oriented x 3. Pt has been coughing and copious amount of sputum. "  Admitted to hospitalist service with aspiration pneumonia.     Assessment & Plan   Principal Problem:   Aspiration pneumonia ([place]) Active Problems:   Hypertension   Quadriplegia ([facility])   Sacral decubitus ulcer, stage II (HCC)   Goals of care, counseling/discussion   Aspiration pneumonia - present on admission with SOB, copious sputum production, difficulty clearing secretions.  Chest xray consistent with aspiration PNA. 11/4 - continues to have copious thick secretions, but none coming out or around trach. 11/5 - tachypnea and hypoxic this morning, placed on nasal cannula 11/6 - fever 8:30 PM last night, but procal is <0.10.  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  Secretions continue to be clear and non-purulent. --Continue Unasyn - last day --Aspiration precautions --Diligent oral care --SLP evaluation - continues NPO status --RT following --PRN Tylenol if fevers --PRN antitussives --scopolamine patch to dry up secretions --continue close monitoring in stepdown  Hypernatremia - Resolved 11/6. Mildly up at 146 today.  Monitor BMP. -- Continue free water flushes at 400cc q4h.  Stopped D5W fluids 11/6.  Hypotension - likely multifactorial due to sepsis/infection and hypovolemia, and replated to autonominc dysfunction with spinal  cord injury.  Family report hypotension at home. --continue Florinef and midodrine  Iron deficiency anemia - monitor CBC.  Transfuse for Hbg < 7.0.  Continue iron supplement.  Venofer infusion today (11/6). Hbg today is 7.0, no evidence of bleeding.  Low iron on anemia panel despite PO supplement.  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  Follows with Dr. Richardson Landry. --Tube feeds with free water flushes per orders. If abdominal distention, decrease volume & increase frequent of feeds - see order comments for details. --Routine trach care --Continue baclofen --Diligent bowel regimen - suppository and manual stimulation at bedtime.  PRN suppository if needed during day.  Sacral decubitus ulcer, stage II  - POA.  WOC following.  Due to quadriplegic status and immobillity.   --Reposition patient at least every 2 hours. Pressure Injury 07/26/20 Sacrum Unstageable - Full thickness tissue loss in which the base of the injury is covered by slough (yellow, tan, gray, green or brown) and/or eschar (tan, brown or black) in the wound bed. (Active)  07/26/20 0513  Location: Sacrum  Location Orientation:   Staging: Unstageable - Full thickness tissue loss in which the base of the injury is covered by slough (yellow, tan, gray, green or brown) and/or eschar (tan, brown or black) in the wound bed.  Wound Description (Comments):   Present on Admission: Yes    Hx of Hypertension - not on antihypertensives, but on Florinef  Hx of depression - continue Prozac  Right shoulder pain - chronic.  Lidoderm patches.     DVT prophylaxis: enoxaparin (  LOVENOX) injection 40 mg Start: 07/26/20 1000 SCDs Start: 07/25/20 2243   Diet:  Diet Orders (From admission, onward)    Start     Ordered   07/25/20 2244  Diet NPO time specified  Diet effective now        07/25/20 2244            Code Status: Full Code    Subjective 07/30/20    Pt seen this AM with  wife at bedside.  Patient denies feeling feverish of chills.  Says feeling okay today, secretions are better.  Abdomen feels little better.  He had a low grade fever earlier again, but secretions are still clear and nonpurulent, improving in quantity as well.  Discussed with patient and wife potential d/c Tuesday AM if doing well for 24 hours off antibiotics which will finish later today.  They are agreeable.  Having some increase in spasticity today.  Would really like to have ice chips.  Ask to see if Dr. Richardson Landry ENT can see pt in hospital as pt has appt with him in clinic tomorrow AM for his trach.   Disposition Plan & Communication   Status is: Inpatient  Inpatient remains appropriate status due to severity of illness requiring IV therapies as above.  Due to patient's trach status and aspiration in addition to intermittent fevers, patient requires monitoring for 24 hours after completing antibiotics.  Dispo: The patient is from: Home              Anticipated d/c is to: Home               Anticipated d/c date is: 2 days              Patient currently is not medically stable for d/c.  Family Communication: wife was present at bedside during encounter today.     Consults, Procedures, Significant Events   Consultants:   [place]  Palliative care  Procedures:   none  Antimicrobials:  Anti-infectives (From admission, onward)   Start     Dose/Rate Route Frequency Ordered Stop   07/26/20 1600  Ampicillin-Sulbactam (UNASYN) 3 g in sodium chloride 0.9 % 100 mL IVPB        3 g 200 mL/hr over 30 Minutes Intravenous Every 6 hours 07/26/20 1130 07/30/20 2359   07/26/20 1200  vancomycin (VANCOCIN) IVPB 1000 mg/200 mL premix  Status:  Discontinued        1,000 mg 200 mL/hr over 60 Minutes Intravenous Every 12 hours 07/25/20 2304 07/26/20 1106   07/25/20 2315  vancomycin (VANCOREADY) IVPB 2000 mg/400 mL       "Followed by" Linked Group Details   2,000 mg 200 mL/hr over 120 Minutes  Intravenous  Once 07/25/20 2301 07/26/20 0205   07/25/20 2315  vancomycin (VANCOREADY) IVPB 500 mg/100 mL       "Followed by" Linked Group Details   500 mg 100 mL/hr over 60 Minutes Intravenous  Once 07/25/20 2301 07/26/20 0323   07/25/20 1500  piperacillin-tazobactam (ZOSYN) IVPB 3.375 g  Status:  Discontinued        3.375 g 12.5 mL/hr over 240 Minutes Intravenous Every 8 hours 07/25/20 1446 07/26/20 1129   07/25/20 1430  Ampicillin-Sulbactam (UNASYN) 3 g in sodium chloride 0.9 % 100 mL IVPB  Status:  Discontinued        3 g 200 mL/hr over 30 Minutes Intravenous  Once 07/25/20 1418 07/25/20 1446        Objective  Vitals:   07/29/20 1936 07/30/20 0500 07/30/20 0855 07/30/20 1213  BP: 136/60 126/63 125/73 (!) 143/80  Pulse: 84 87 87 80  Resp: 20 (!) 24 18 20   Temp: 98.2 F (36.8 C) 98.7 F (37.1 C) 100.1 F (37.8 C) 98 F (36.7 C)  TempSrc: Oral Oral Oral Oral  SpO2: 100% 100% 99% 100%  Weight:      Height:        Intake/Output Summary (Last 24 hours) at 07/30/2020 1258 Last data filed at 07/30/2020 1200 Gross per 24 hour  Intake 1586.63 ml  Output 5200 ml  Net -3613.37 ml   Filed Weights   07/25/20 1300  Weight: 108 kg    Physical Exam:  General exam: awake, alert, no acute distress HEENT: trach in place capped, no visible secretions, Yonker on extension arm pt able to use independently  Respiratory system: clear bilaterally without secretion sounds, no wheezes or rhonchi, normal respiratory effort, on 2 L/min nasal cannula oxygen. Cardiovascular system: normal S1/S2, RRR, no pedal edema.   Gastrointestinal system: soft, NT, improved distention, PEG tube in place    Labs   Data Reviewed: I have personally reviewed following labs and imaging studies  CBC: Recent Labs  Lab 07/25/20 1308 07/27/20 0726 07/28/20 0605 07/29/20 0713 07/30/20 0343  WBC 8.9 8.5 7.6 9.4 8.9  NEUTROABS 5.9 5.8  --  6.6 5.9  HGB 8.6* 7.9* 7.1* 7.0* 7.4*  HCT 27.1* 25.6* 23.1*  22.8* 24.3*  MCV 88.6 89.2 89.2 90.8 89.7  PLT 421* 321 409* 322 981   Basic Metabolic Panel: Recent Labs  Lab 07/25/20 1308 07/27/20 0726 07/28/20 0605 07/29/20 0713 07/30/20 0343  NA 141 148* 149* 144 146*  K 4.1 3.8 3.7 4.0 3.8  CL 104 112* 113* 108 109  CO2 28 27 26 27 29   GLUCOSE 72 102* 97 117* 121*  BUN 26* 26* 27* 23 23  CREATININE 0.71 0.76 0.80 0.86 0.86  CALCIUM 9.7 9.3 9.6 9.4 9.4  MG  --   --   --  2.3  --    GFR: Estimated Creatinine Clearance: 112.4 mL/min (by C-G formula based on SCr of 0.86 mg/dL). Liver Function Tests: Recent Labs  Lab 07/25/20 1308  AST 47*  ALT 64*  ALKPHOS 162*  BILITOT 0.7  PROT 8.2*  ALBUMIN 2.3*   No results for input(s): LIPASE, AMYLASE in the last 168 hours. No results for input(s): AMMONIA in the last 168 hours. Coagulation Profile: No results for input(s): INR, PROTIME in the last 168 hours. Cardiac Enzymes: No results for input(s): CKTOTAL, CKMB, CKMBINDEX, TROPONINI in the last 168 hours. BNP (last 3 results) No results for input(s): PROBNP in the last 8760 hours. HbA1C: No results for input(s): HGBA1C in the last 72 hours. CBG: Recent Labs  Lab 07/26/20 0421  GLUCAP 170*   Lipid Profile: No results for input(s): CHOL, HDL, LDLCALC, TRIG, CHOLHDL, LDLDIRECT in the last 72 hours. Thyroid Function Tests: No results for input(s): TSH, T4TOTAL, FREET4, T3FREE, THYROIDAB in the last 72 hours. Anemia Panel: No results for input(s): VITAMINB12, FOLATE, FERRITIN, TIBC, IRON, RETICCTPCT in the last 72 hours. Sepsis Labs: Recent Labs  Lab 07/25/20 1319 07/25/20 1533 07/25/20 2205 07/26/20 0318 07/29/20 0713  PROCALCITON <0.10  --   --   --  <0.10  LATICACIDVEN  --  1.1 1.4 1.5  --     Recent Results (from the past 240 hour(s))  Respiratory Panel by RT PCR (Flu A&B, Covid) -  Nasopharyngeal Swab     Status: None   Collection Time: 07/23/20  3:23 AM   Specimen: Nasopharyngeal Swab  Result Value Ref Range Status    SARS Coronavirus 2 by RT PCR NEGATIVE NEGATIVE Final    Comment: (NOTE) SARS-CoV-2 target nucleic acids are NOT DETECTED.  The SARS-CoV-2 RNA is generally detectable in upper respiratoy specimens during the acute phase of infection. The lowest concentration of SARS-CoV-2 viral copies this assay can detect is 131 copies/mL. A negative result does not preclude SARS-Cov-2 infection and should not be used as the sole basis for treatment or other patient management decisions. A negative result may occur with  improper specimen collection/handling, submission of specimen other than nasopharyngeal swab, presence of viral mutation(s) within the areas targeted by this assay, and inadequate number of viral copies (<131 copies/mL). A negative result must be combined with clinical observations, patient history, and epidemiological information. The expected result is Negative.  Fact Sheet for Patients:  PinkCheek.be  Fact Sheet for Healthcare Providers:  GravelBags.it  This test is no t yet approved or cleared by the [place] FDA and  has been authorized for detection and/or diagnosis of SARS-CoV-2 by FDA under an Emergency Use Authorization (EUA). This EUA will remain  in effect (meaning this test can be used) for the duration of the COVID-19 declaration under Section 564(b)(1) of the Act, 21 U.S.C. section 360bbb-3(b)(1), unless the authorization is terminated or revoked sooner.     Influenza A by PCR NEGATIVE NEGATIVE Final   Influenza B by PCR NEGATIVE NEGATIVE Final    Comment: (NOTE) The Xpert Xpress SARS-CoV-2/FLU/RSV assay is intended as an aid in  the diagnosis of influenza from Nasopharyngeal swab specimens and  should not be used as a sole basis for treatment. Nasal washings and  aspirates are unacceptable for Xpert Xpress SARS-CoV-2/FLU/RSV  testing.  Fact Sheet for  Patients: PinkCheek.be  Fact Sheet for Healthcare Providers: GravelBags.it  This test is not yet approved or cleared by the [place] FDA and  has been authorized for detection and/or diagnosis of SARS-CoV-2 by  FDA under an Emergency Use Authorization (EUA). This EUA will remain  in effect (meaning this test can be used) for the duration of the  Covid-19 declaration under Section 564(b)(1) of the Act, 21  U.S.C. section 360bbb-3(b)(1), unless the authorization is  terminated or revoked. Performed at [facility], [address] [place], [place] [address]   Respiratory Panel by RT PCR (Flu A&B, Covid) - Nasopharyngeal Swab     Status: None   Collection Time: 07/25/20  2:11 PM   Specimen: Nasopharyngeal Swab  Result Value Ref Range Status   SARS Coronavirus 2 by RT PCR NEGATIVE NEGATIVE Final    Comment: (NOTE) SARS-CoV-2 target nucleic acids are NOT DETECTED.  The SARS-CoV-2 RNA is generally detectable in upper respiratoy specimens during the acute phase of infection. The lowest concentration of SARS-CoV-2 viral copies this assay can detect is 131 copies/mL. A negative result does not preclude SARS-Cov-2 infection and should not be used as the sole basis for treatment or other patient management decisions. A negative result may occur with  improper specimen collection/handling, submission of specimen other than nasopharyngeal swab, presence of viral mutation(s) within the areas targeted by this assay, and inadequate number of viral copies (<131 copies/mL). A negative result must be combined with clinical observations, patient history, and epidemiological information. The expected result is Negative.  Fact Sheet for Patients:  PinkCheek.be  Fact Sheet for  Healthcare Providers:  GravelBags.it  This test is no t yet approved or cleared by the [place] and  has been authorized for detection and/or diagnosis of SARS-CoV-2 by FDA under an Emergency Use Authorization (EUA). This EUA will remain  in effect (meaning this test can be used) for the duration of the COVID-19 declaration under Section 564(b)(1) of the Act, 21 U.S.C. section 360bbb-3(b)(1), unless the authorization is terminated or revoked sooner.     Influenza A by PCR NEGATIVE NEGATIVE Final   Influenza B by PCR NEGATIVE NEGATIVE Final    Comment: (NOTE) The Xpert Xpress SARS-CoV-2/FLU/RSV assay is intended as an aid in  the diagnosis of influenza from Nasopharyngeal swab specimens and  should not be used as a sole basis for treatment. Nasal washings and  aspirates are unacceptable for Xpert Xpress SARS-CoV-2/FLU/RSV  testing.  Fact Sheet for Patients: PinkCheek.be  Fact Sheet for Healthcare Providers: GravelBags.it  This test is not yet approved or cleared by the [place] FDA and  has been authorized for detection and/or diagnosis of SARS-CoV-2 by  FDA under an Emergency Use Authorization (EUA). This EUA will remain  in effect (meaning this test can be used) for the duration of the  Covid-19 declaration under Section 564(b)(1) of the Act, 21  U.S.C. section 360bbb-3(b)(1), unless the authorization is  terminated or revoked. Performed at [facility], [address] [place], [place] [address]   Blood culture (routine x 2)     Status: None (Preliminary result)   Collection Time: 07/25/20  3:34 PM   Specimen: BLOOD  Result Value Ref Range Status   Specimen Description BLOOD BLOOD RIGHT FOREARM  Final   Special Requests   Final    BOTTLES DRAWN AEROBIC AND ANAEROBIC Blood Culture adequate volume   Culture   Final    NO GROWTH 4 DAYS Performed at [facility], [address] [place], [place] [address]    Report Status PENDING  Incomplete  Blood culture (routine x 2)      Status: None (Preliminary result)   Collection Time: 07/25/20  3:34 PM   Specimen: BLOOD  Result Value Ref Range Status   Specimen Description BLOOD BLOOD LEFT FOREARM  Final   Special Requests   Final    BOTTLES DRAWN AEROBIC AND ANAEROBIC Blood Culture adequate volume   Culture   Final    NO GROWTH 4 DAYS Performed at [facility], [address] [place], [place] [address]    Report Status PENDING  Incomplete  Culture, respiratory (non-expectorated)     Status: None   Collection Time: 07/25/20  6:36 PM   Specimen: Tracheal Aspirate; Respiratory  Result Value Ref Range Status   Specimen Description   Final    TRACHEAL ASPIRATE Performed at [facility], [address], [place], [place] [address]    Special Requests   Final    NONE Performed at [facility], [address] [place], [place] [address]    Gram Stain   Final    MODERATE WBC PRESENT,BOTH PMN AND MONONUCLEAR FEW GRAM POSITIVE COCCI IN CHAINS FEW GRAM POSITIVE RODS RARE GRAM NEGATIVE RODS    Culture   Final    FEW Normal respiratory flora-no Staph aureus or Pseudomonas seen Performed at [facility], [place] [address] [place],  [address]    Report Status 07/28/2020 FINAL  Final  MRSA PCR Screening     Status: None   Collection Time:  07/26/20  4:51 AM   Specimen: Nasopharyngeal  Result Value Ref Range Status   MRSA by PCR NEGATIVE NEGATIVE Final    Comment:        The GeneXpert MRSA Assay (FDA approved for NASAL specimens only), is one component of a comprehensive MRSA colonization surveillance program. It is not intended to diagnose MRSA infection nor to guide or monitor treatment for MRSA infections. Performed at [facility], [address], [place], [place] [address]       Imaging Studies   No results found.   Medications   Scheduled Meds: . ascorbic acid  500 mg Per Tube BID  . baclofen  10 mg Per Tube TID  . bisacodyl  10 mg Rectal QHS   . chlorhexidine  15 mL Mouth Rinse BID  . Chlorhexidine Gluconate Cloth  6 each Topical Daily  . collagenase   Topical Daily  . enoxaparin (LOVENOX) injection  40 mg Subcutaneous Q24H  . famotidine  20 mg Per Tube Daily  . feeding supplement (OSMOLITE 1.5 CAL)  237 mL Per Tube Q3H  . feeding supplement (PROSource TF)  45 mL Per Tube BID  . ferrous sulfate  75 mg Per Tube Daily  . fludrocortisone  0.2 mg Per Tube Daily  . FLUoxetine  40 mg Per Tube Daily  . free water  400 mL Per Tube Q4H  . guaiFENesin  20 mL Per Tube Q6H  . lidocaine  1 patch Transdermal Q24H  . mouth rinse  15 mL Mouth Rinse q12n4p  . midodrine  10 mg Per Tube TID WC  . multivitamin  5 mL Per Tube Daily  . nutrition supplement (JUVEN)  1 packet Per Tube BID BM  . polycarbophil  625 mg Per Tube Daily  . saccharomyces boulardii  250 mg Per Tube BID  . scopolamine  1 patch Transdermal Q72H  . simethicone  80 mg Per Tube QID  . traZODone  50 mg Per Tube QHS   Continuous Infusions: . sodium chloride Stopped (07/25/20 2347)  . ampicillin-sulbactam (UNASYN) IV 3 g (07/30/20 0927)       LOS: 5 days    Time spent: 25 minutes with > 50% spent in coordination of care and direct patient contact.    Ezekiel Slocumb, DO [facility]  07/30/2020, 12:58 PM    If 7PM-7AM, please contact night-coverage. How to contact the [facility] Attending or Consulting provider [place] or covering provider during after hours [place], for this patient?    1. Check the care team in [facility] and look for a) attending/consulting TRH provider listed and b) the [facility] team listed 2. Log into [email or web address] and use [facility]'s universal password to access. If you do not have the password, please contact the hospital operator. 3. Locate the [name] provider you are looking for under [facility] and page to a number that you can be directly reached. 4. If you still have difficulty reaching the provider, please page the [facility] (Director on Call)  for the Hospitalists listed on amion for assistance.

## 2020-07-31 ENCOUNTER — Inpatient Hospital Stay: Payer: BC Managed Care – PPO

## 2020-07-31 ENCOUNTER — Encounter: Payer: BC Managed Care – PPO | Admitting: Registered Nurse

## 2020-07-31 DIAGNOSIS — J690 Pneumonitis due to inhalation of food and vomit: Secondary | ICD-10-CM | POA: Diagnosis not present

## 2020-07-31 DIAGNOSIS — G8250 Quadriplegia, unspecified: Secondary | ICD-10-CM

## 2020-07-31 DIAGNOSIS — I10 Essential (primary) hypertension: Secondary | ICD-10-CM

## 2020-07-31 DIAGNOSIS — Z7189 Other specified counseling: Secondary | ICD-10-CM | POA: Diagnosis not present

## 2020-07-31 LAB — CBC
HCT: 25.2 % — ABNORMAL LOW (ref 39.0–52.0)
Hemoglobin: 7.7 g/dL — ABNORMAL LOW (ref 13.0–17.0)
MCH: 27.4 pg (ref 26.0–34.0)
MCHC: 30.6 g/dL (ref 30.0–36.0)
MCV: 89.7 fL (ref 80.0–100.0)
Platelets: 303 10*3/uL (ref 150–400)
RBC: 2.81 MIL/uL — ABNORMAL LOW (ref 4.22–5.81)
RDW: 17 % — ABNORMAL HIGH (ref 11.5–15.5)
WBC: 9.9 10*3/uL (ref 4.0–10.5)
nRBC: 0.4 % — ABNORMAL HIGH (ref 0.0–0.2)

## 2020-07-31 LAB — BASIC METABOLIC PANEL
Anion gap: 9 (ref 5–15)
BUN: 25 mg/dL — ABNORMAL HIGH (ref 8–23)
CO2: 29 mmol/L (ref 22–32)
Calcium: 9.5 mg/dL (ref 8.9–10.3)
Chloride: 106 mmol/L (ref 98–111)
Creatinine, Ser: 0.88 mg/dL (ref 0.61–1.24)
GFR, Estimated: >60 mL/min (ref >60)
Glucose, Bld: 112 mg/dL — ABNORMAL HIGH (ref 70–99)
Potassium: 4 mmol/L (ref 3.5–5.1)
Sodium: 144 mmol/L (ref 135–145)

## 2020-07-31 IMAGING — DX DG CHEST 1V PORT
3 series · 3 of 3 positions shown · non-contrast
Comparison: [DATE]

CLINICAL DATA: Respiratory failure.

EXAM:
PORTABLE CHEST 1 VIEW

[chest ap (1 of 3)]
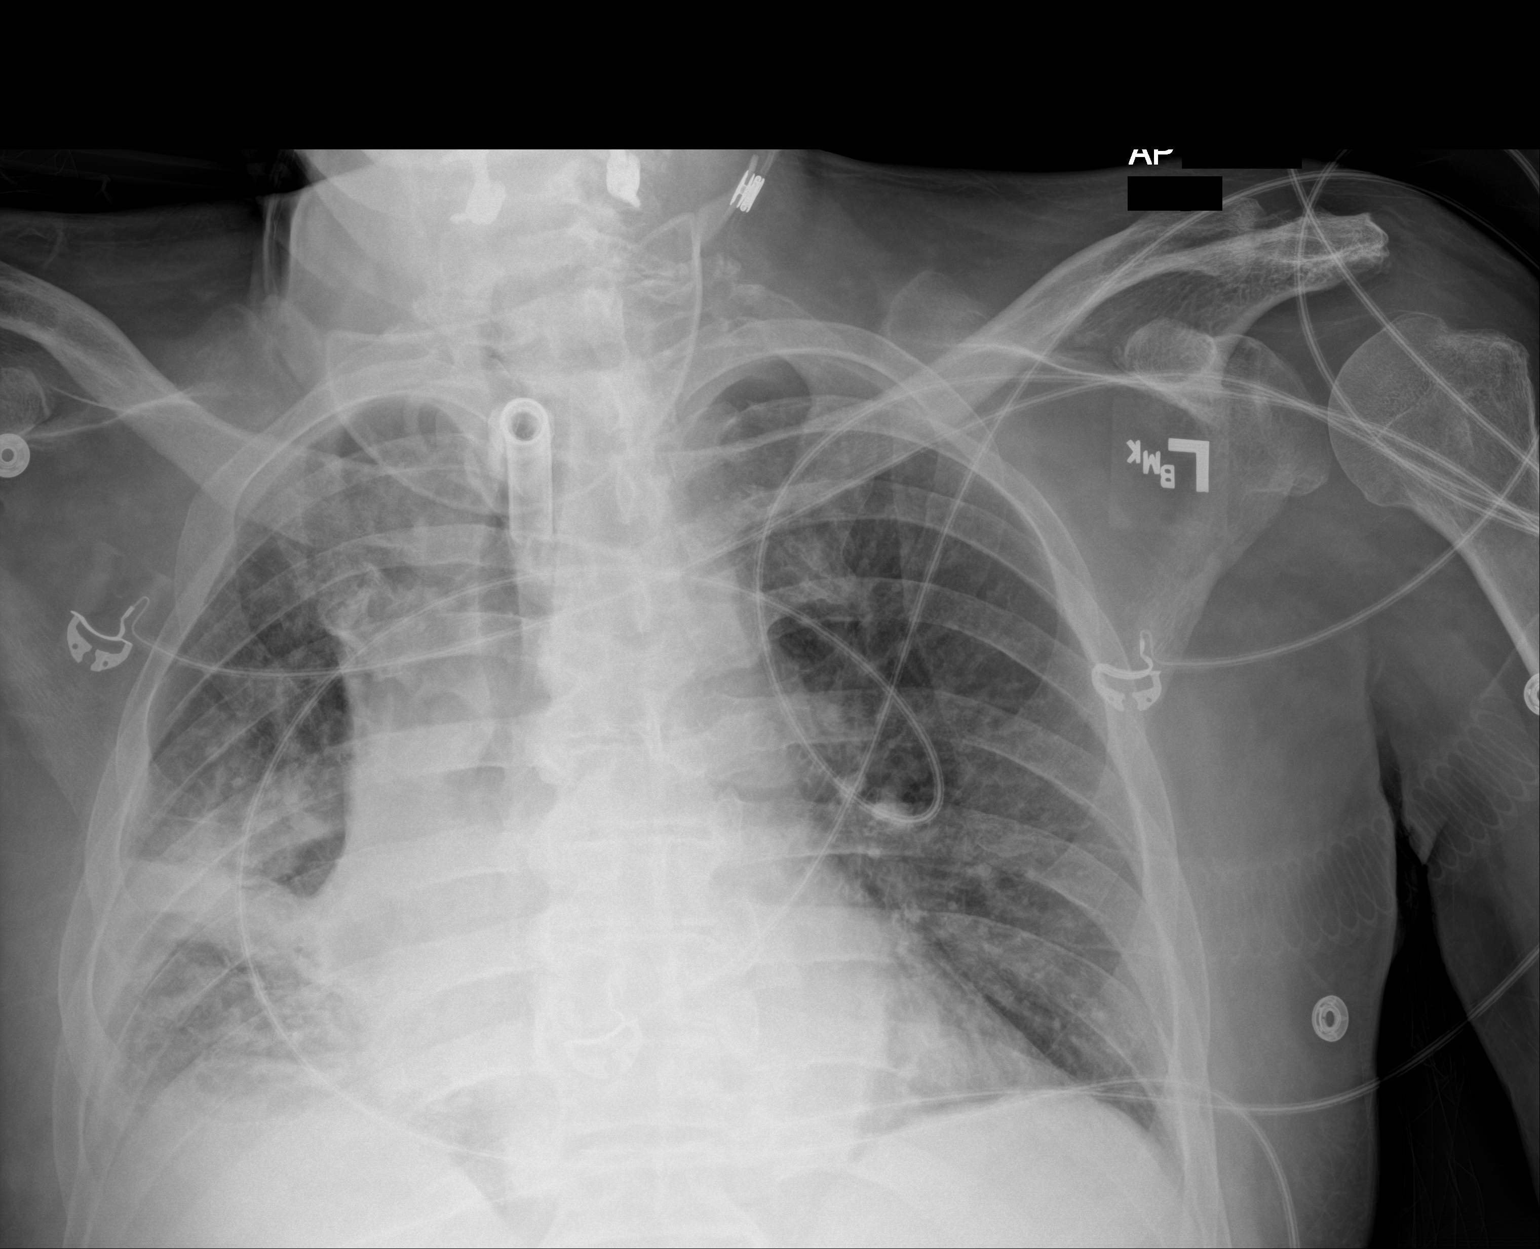

[chest ap (2 of 3)]
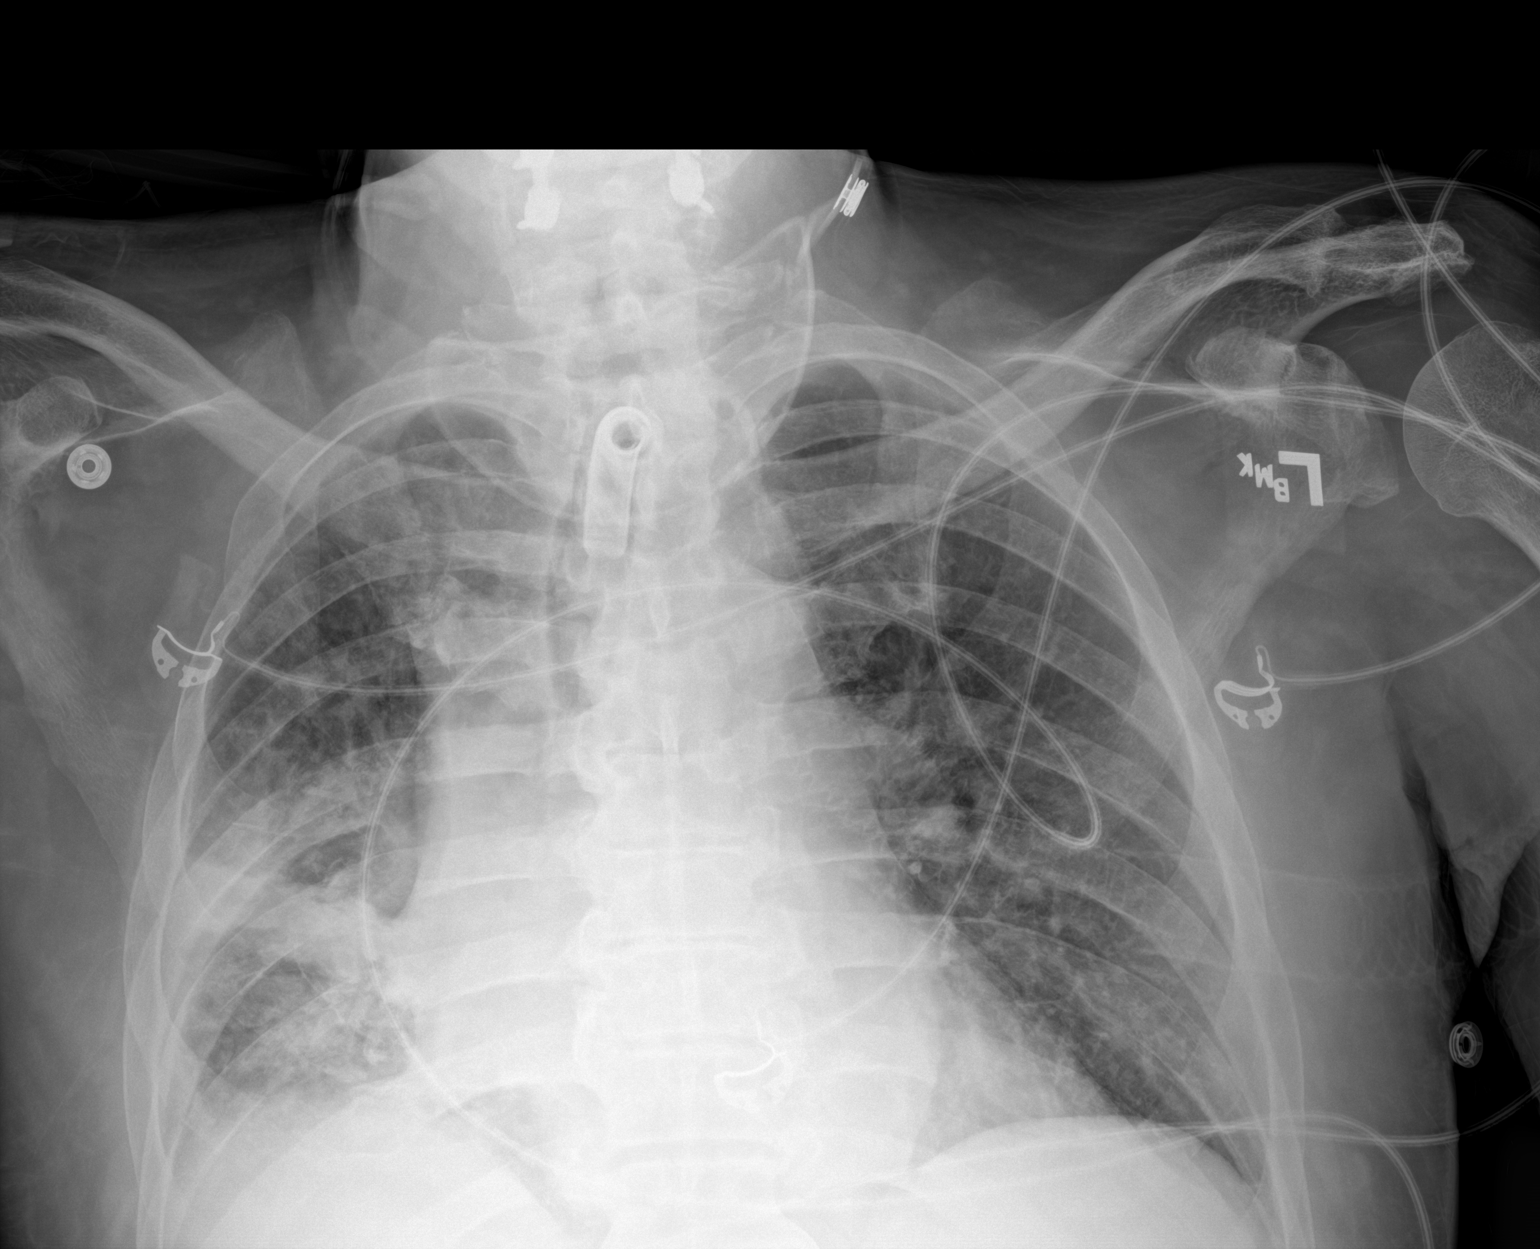

[chest ap (3 of 3)]
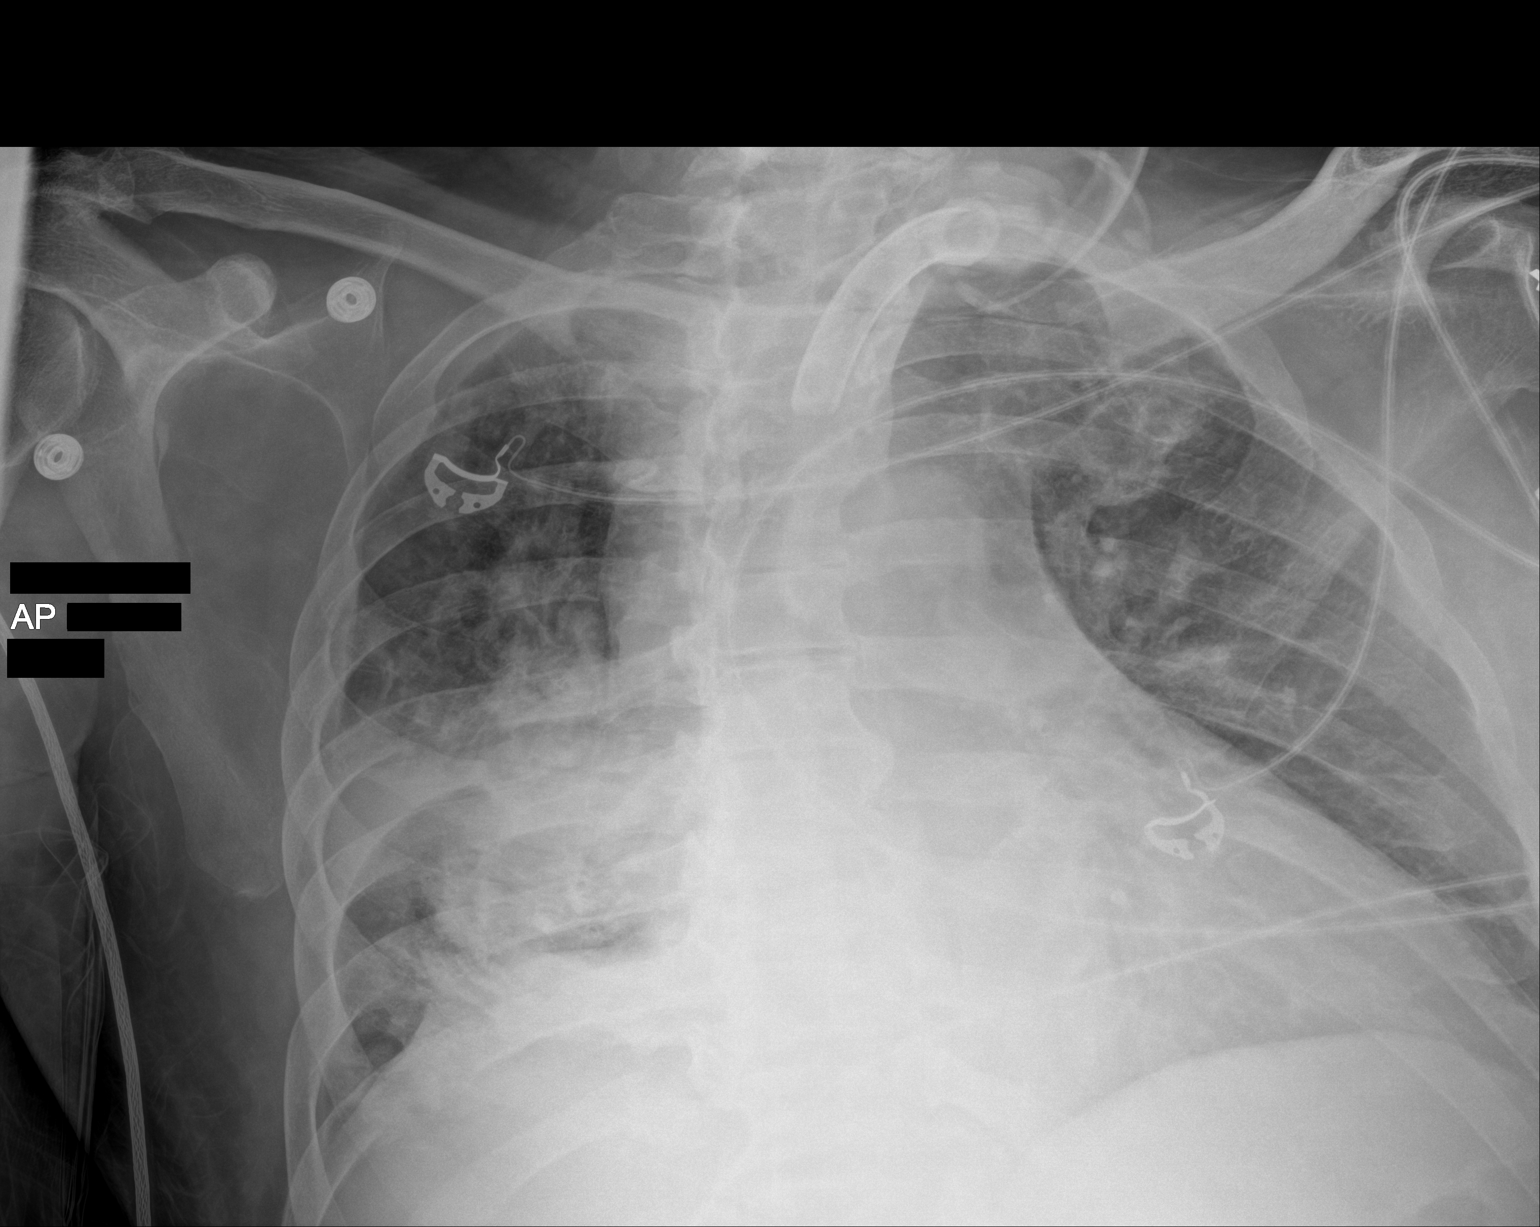

[3 of 3 positions shown; findings below may reference images not displayed]

FINDINGS: At [PK] hours. The cardio pericardial silhouette is enlarged. Low
lung volumes. Interval decrease in right lung collapse/consolidative
opacity. Tracheostomy tube again noted. Telemetry leads overlie the
chest.
IMPRESSION: 1. Interval decrease in right lung collapse/consolidative opacity.
2. Low lung volumes.

## 2020-07-31 IMAGING — DX DG CHEST 1V PORT
1 series · 1 of 1 positions shown · non-contrast
Comparison: [DATE]

CLINICAL DATA: Worsening shortness of breath today. History of
hypertension.

EXAM:
PORTABLE CHEST 1 VIEW

[chest ap]
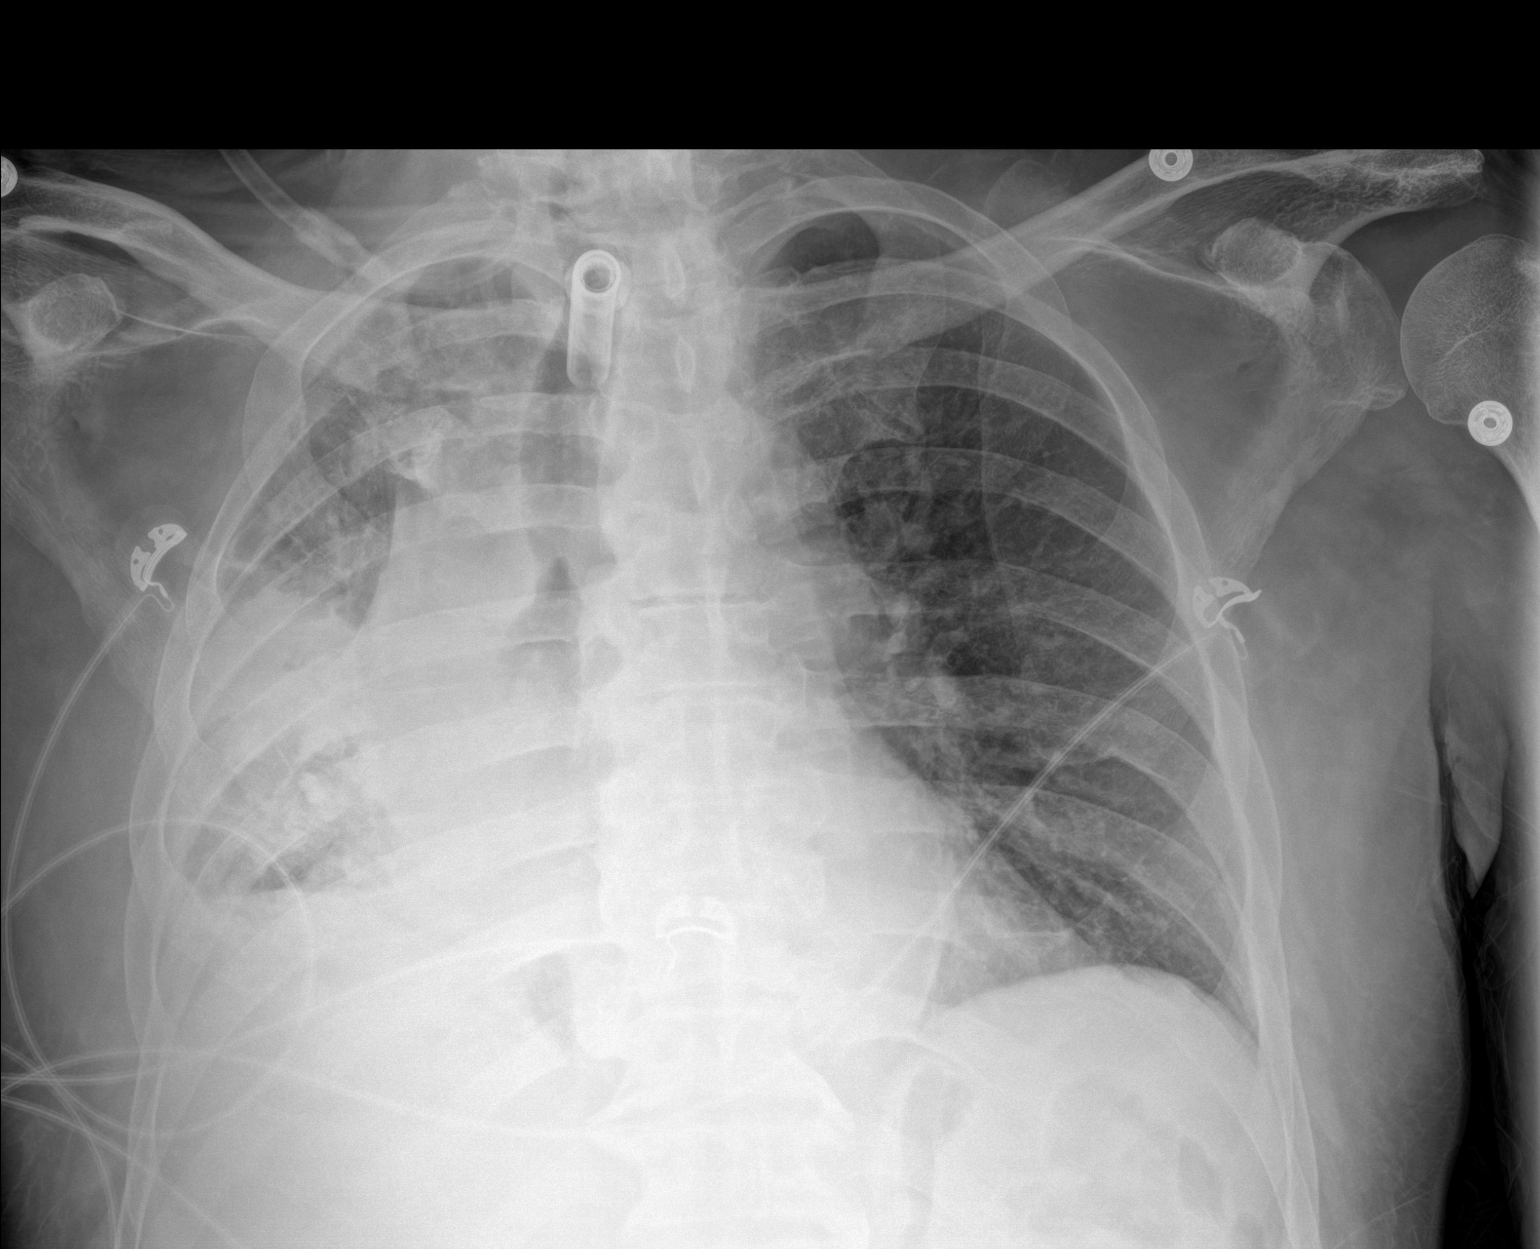

[1 of 1 positions shown; findings below may reference images not displayed]

FINDINGS: Tracheostomy in place. Left lung remains largely clear. New
development of infiltrate, volume loss and consolidation in the mid
and lower lung on the right.
IMPRESSION: New infiltrate, volume loss and consolidation in the mid and lower
lung on the right.

## 2020-07-31 MED ORDER — SODIUM CHLORIDE 0.9 % IV SOLN
300.0000 mg | Freq: Once | INTRAVENOUS | Status: AC
  Administered 2020-07-31: 300 mg via INTRAVENOUS
  Filled 2020-07-31: qty 15

## 2020-07-31 MED ORDER — LIDOCAINE VISCOUS HCL 2 % MT SOLN
15.0000 mL | Freq: Once | OROMUCOSAL | Status: DC
  Filled 2020-07-31: qty 15

## 2020-07-31 NOTE — Progress Notes (Signed)
Deep suctioning and inner cannula exchanged which seemed to help decrease work of breathing and increased respirations.  Will continue to monitor.  Wife at bedside.

## 2020-07-31 NOTE — Progress Notes (Signed)
Sx. Pt. For mod. Amt. Of clear thin secretions. Pt. Tolerated well.

## 2020-07-31 NOTE — Progress Notes (Signed)
Wife comes out of room and states "patient does not look well" . Patient increased respiration and increased work of breathing. MD and Respiratory notified to evaluate.

## 2020-07-31 NOTE — TOC Progression Note (Addendum)
Transition of Care [facility]) - Progression Note    Patient Details  Name: Trigo Winterbottom MRN: [id number] Date of Birth: [birth year]  Transition of Care [facility]) CM/SW [place], LCSW Phone Number: 07/31/2020, 12:28 PM  Clinical Narrative:  Received call from [place] at [facility]. Discussed difficulty with finding a home health agency to accept him. CSW asked around to local agencies to see if anyone could accept. [facility], [facility], and [facility] unable to accept referral. [facility] unable to accept because copays are so high at $130-$240 per visit. [facility] is checking to see if they can accept. Left message for [facility] representative.   1:30 pm: [facility], [facility], [facility], and [facility] are unable to accept. Faxed referral to [facility] to review but they said they are accepting a very limited number of patients in this area due to staffing.  4:15 pm: [facility] unable to accept. Wife is aware. Discussed potential for hospice services but CSW explained that patient would have to make that decision since he is still fully oriented. They will discuss today and follow up in the morning. Wife asking for home oxygen. [facility] has been notified. If they decide to move forward with hospice services, hospice will provide the oxygen. Wife confirmed patient will need EMS transport home. Confirmed address on facesheet is correct.  Expected Discharge Plan and Services                                                 Social Determinants of Health (SDOH) Interventions    Readmission Risk Interventions No flowsheet data found.

## 2020-07-31 NOTE — Progress Notes (Signed)
Assessed PIV to hang iron and R fa flushed but hardened area above site and R hand PIV positional.  IV team consult placed for access to infuse iron.  ICU RN notified of access issues prior to sending.  MD requested patient to be transferred for procedure prior to IV team arrival to floor.  Alerted receiving RN of access issue on arrival.

## 2020-07-31 NOTE — Procedures (Signed)
  PROCEDURE: BRONCHOSCOPY Therapeutic Aspiration of Tracheobronchial Tree  PROCEDURE DATE: 07/31/2020  TIME:  NAMEBreion Novacek  DOB:Nov 18, 1950  MRN: [id number] LOC:  IC10A/IC10A-AA    HOSP DAY: @LENGTHOFSTAYDAYS @ CODE STATUS:      Code Status Orders  (From admission, onward)         Start     Ordered   07/25/20 2244  Full code  Continuous        07/25/20 2244        Code Status History    Date Active Date Inactive Code Status Order ID Comments User Context   06/23/2020 1817 07/20/2020 1604 Full Code [id number]  Flora Lipps Inpatient   06/08/2020 0731 06/23/2020 1640 Full Code [id number]  Esmond Camper Inpatient   Advance Care Planning Activity          Indications/Preliminary Diagnosis:   Consent: (Place X beside choice/s below)  The benefits, risks and possible complications of the procedure were        explained to:  ___ patient  ___ patient's family  _x__ other:___wife________  who verbalized understanding and gave:  ___ verbal  ___ written  _x__ verbal and written  ___ telephone  ___ other:________ consent.      Unable to obtain consent; procedure performed on emergent basis.     Other:        PROCEDURE DETAILS: Timeout performed and correct patient, name, & ID confirmed. Following prep per Pulmonary policy, appropriate sedation was administered. The Bronchoscope was inserted in to oral cavity with bite block in place. Therapeutic aspiration of Tracheobronchial tree was performed.  Airway exam proceeded with findings, technical procedures, and specimen collection as noted below. At the end of exam the scope was withdrawn without incident. Impression and Plan as noted below.           Airway Prep (Place X beside choice below)  2 ml 1% Transtracheal Lidocaine Anesthetization   Patient prepped per Bronchoscopy Lab Policy       Insertion Route (Place X beside choice below)   Nasal   Oral   Endotracheal Tube  x Tracheostomy    TECHNICAL  PROCEDURES: (Place X beside choice below)   Procedures  Description    None     Electrocautery     Cryotherapy     Balloon Dilatation     Bronchography     Stent Placement   x  Therapeutic Aspiration Thick mucoid purulent secretions aspiration from RT lung    Laser/Argon Plasma    Brachytherapy Catheter Placement    Foreign Body Removal         SPECIMENS (Sites): (Place X beside choice below)  Specimens Description   No Specimens Obtained     [place]   x Lavage Thick mucopurulent secretions and mucus plugs   Biopsies    Fine Needle Aspirates    Brushings    Sputum    FINDINGS: pneumonia and mucus plugs ESTIMATED BLOOD LOSS: none COMPLICATIONS/RESOLUTION: none      IMPRESSION:POST-PROCEDURE DX: aspiration pneumonia    RECOMMENDATION/PLAN:   continue Iv ABX Start steroids May need repeat Bronch Check CXR    Richardson Dubree Patricia Pesa, M.D.  [facility] [place] Director [place] Director [place] Department

## 2020-07-31 NOTE — Consult Note (Addendum)
Name: Ryan Day MRN: [id number] DOB: 09/26/1950    ADMISSION DATE:  07/25/2020 CONSULTATION DATE:  07/31/2020  REFERRING MD : Nicole Kindred DO  CHIEF COMPLAINT: Aspiration Pneumonia  BRIEF PATIENT DESCRIPTION: 69 y.o male with significant hx of spinal cord injury, recurrent aspiration pneumonia and mucus plugging admitted on 07/25/20 with aspiration pneumonia. Now with worsening hypoxia, dyspnea and tachypnea on exam. Repeat chest Xray concerning for mucus plugging.  SIGNIFICANT EVENTS  11/2> Admitted to stepdown unit under hospitalist service with aspiration pneumonia and hypotension.  11/3>Transferred to the floor 11/8>Transferred back to stepdown.  PCCM consulted for possible mucus plugging of RLL.  STUDIES:  11/8> Chest xray New infiltrate, volume loss and consolidation in the mid and lower lung on the right.  MICROBIOLOGY: 11/2 BCx: No growth 11/2 UCx:  11/2 Sputum: Gram positive cocci and Gram negative rods 11/2 MRSA PCR: Negative   ANTIBIOTICS: 11/2> Unasyn > completed 11/7  HISTORY OF PRESENT ILLNESS:  69 y.o male with significant PMH of HTN, acute on chronic hypoxic respiratory failure, AKI due to hypovolemia, C5 vertebral body fracture, C3-C6 cord compression with edema s/p C3-C6 laminectomy with fusion and repair of dural tear  (04/26/20), spinal shock, post procedural PTX,recurrent aspiration pneumonia with mucus plugging s/p PEG/TRACH 05/04/20, and PEA Cardiac arrest with ROSC who presented to [facility] ED on 07/25/20 with hypoxia, rhonchi, congestion, cough and tachypnea.  In the ED, chest xray was obtained which was consisted with aspiration pneumonia. No evidence of pneumothorax or significant effusion. Labs revealed slightly elevated LFTs, hypoalbuminemia 8.6 (baseline 8.0) otherwise unremarkable CBC and CMP. VBG showed no evidence of hypercarbic respiratory failure. Patient was started on Unasyn and  admitted under hospitalist. service for management of aspiration  pneumonia and hypotension.  During the course of his hospitalization, patient has continued to have copious thick secretions requiring frequent suction. He was noted with increased tachypnea, hypoxia and hypotension. PCCM was consulted for possible vasopressor however he ultimately did not need to be started on a pressor has BP improved with midodrine and Florinef. Today patient was noted with increased dyspnea, hypoxia and tachypnea. Chest xray was obtained which showed new development of infiltrate, volume loss and consolidation in the mid and lower lung on the right concerning for mucus plug. PCCM consulted for possible bronchoscopy and management.   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  has a past surgical history that includes Colonoscopy with propofol (N/A, 12/05/2017) and Esophagogastroduodenoscopy (12/05/2017). Prior to Admission medications   Medication Sig Start Date End Date Taking? Authorizing Provider  acetaminophen (TYLENOL) 160 MG/5ML solution Place 10-20 mLs (320-640 mg total) into feeding tube every 4 (four) hours as needed for mild pain. 07/18/20  Yes Love, Ivan Anchors, PA-C  ascorbic acid (VITAMIN C) 500 MG tablet Place 1 tablet (500 mg total) into feeding tube 2 (two) times daily. 07/18/20  Yes Love, Ivan Anchors, PA-C  Baclofen 5 MG TABS Place 5 mg into feeding tube 3 (three) times daily. 07/20/20  Yes Love, Ivan Anchors, PA-C  bisacodyl (DULCOLAX) 10 MG suppository Place 1 suppository (10 mg total) rectally at bedtime. 07/20/20  Yes Love, Ivan Anchors, PA-C  chlorhexidine (PERIDEX) 0.12 % solution 15 mLs by Mouth Rinse route 2 (two) times daily. 07/20/20  Yes Love, Ivan Anchors,  PA-C  collagenase (SANTYL) ointment Apply topically daily. 07/20/20  Yes Love, Ivan Anchors, PA-C  enoxaparin (LOVENOX) 40 MG/0.4ML injection Inject 0.4 mLs (40 mg total) into the skin daily. Patient taking differently: Inject 40 mg into the skin every evening.  07/20/20  Yes Love, Ivan Anchors, PA-C  famotidine (PEPCID) 20 MG tablet Place 1 tablet (20 mg total) into feeding tube daily. 07/20/20  Yes Love, Ivan Anchors, PA-C  ferrous sulfate 300 (60 Fe) MG/5ML syrup Place 1.3 mLs (78 mg total) into feeding tube daily. 07/20/20  Yes Love, Ivan Anchors, PA-C  fludrocortisone (FLORINEF) 0.1 MG tablet Place 2 tablets (0.2 mg total) into feeding tube daily. 07/20/20  Yes Love, Ivan Anchors, PA-C  FLUoxetine (PROZAC) 20 MG/5ML solution Place 10 mLs (40 mg total) into feeding tube daily. 07/20/20  Yes Love, Ivan Anchors, PA-C  guaiFENesin 200 MG tablet Place 2 tablets (400 mg total) into feeding tube every 6 (six) hours. 07/20/20  Yes Love, Ivan Anchors, PA-C  lidocaine (LIDODERM) 5 % Place 1 patch onto the skin daily. Remove & Discard patch within 12 hours or as directed by MD 07/20/20  Yes Love, Ivan Anchors, PA-C  midodrine (PROAMATINE) 10 MG tablet Place 1 tablet (10 mg total) into feeding tube 3 (three) times daily with meals. 07/20/20  Yes Love, Ivan Anchors, PA-C  Mouthwashes (MOUTH RINSE) LIQD solution 15 mLs by Mouth Rinse route 2 times daily at 12 noon and 4 pm. 07/20/20  Yes Love, Ivan Anchors, PA-C  Multiple Vitamin (MULTIVITAMIN) LIQD Place 5 mLs into feeding tube daily. 07/20/20  Yes Love, Ivan Anchors, PA-C  nutrition supplement, JUVEN, (JUVEN) PACK Place 1 packet into feeding tube 2 (two) times daily between meals. 07/20/20  Yes Love, Ivan Anchors, PA-C  Nutritional Supplements (FEEDING SUPPLEMENT, OSMOLITE 1.5 CAL,) LIQD Place 474 mLs into feeding tube 4 (four) times daily. 07/20/20  Yes Love, Ivan Anchors, PA-C  Nutritional Supplements (FEEDING SUPPLEMENT, PROSOURCE TF,) liquid Place 45 mLs into feeding tube 2 (two) times daily. 07/20/20   Yes Love, Ivan Anchors, PA-C  ondansetron (ZOFRAN) 4 MG tablet Place 1 tablet (4 mg total) into feeding tube every 8 (eight) hours as needed for nausea, vomiting or refractory nausea / vomiting. 07/20/20  Yes Love, Ivan Anchors, PA-C  polycarbophil (FIBERCON) 625 MG tablet Place 1 tablet (625 mg total) into feeding tube daily. 07/20/20  Yes Love, Ivan Anchors, PA-C  saccharomyces boulardii (FLORASTOR) 250 MG capsule Place 1 capsule (250 mg total) into feeding tube 2 (two) times daily. 07/20/20  Yes Love, Ivan Anchors, PA-C  scopolamine (TRANSDERM-SCOP) 1 MG/3DAYS Place 1 patch (1.5 mg total) onto the skin every 3 (three) days. 07/20/20  Yes Love, Ivan Anchors, PA-C  sennosides (SENOKOT) 8.8 MG/5ML syrup Place 5 mLs into feeding tube daily at 6 (six) AM. 07/20/20  Yes Love, Ivan Anchors, PA-C  simethicone (MYLICON) 40 ZJ/6.7HA drops Place 1.2 mLs (80 mg total) into feeding tube 4 (four) times daily. 07/20/20  Yes Love, Ivan Anchors, PA-C  traZODone (DESYREL) 50 MG tablet Place 1 tablet (50 mg total) into feeding tube at bedtime. 07/20/20  Yes Love, Ivan Anchors, PA-C  Water For Irrigation, Sterile (FREE WATER) SOLN Place 400 mLs into feeding tube every 4 (four) hours. Patient taking differently: Place 300 mLs into feeding tube every 4 (four) hours.  07/20/20  Yes Love, Ivan Anchors, PA-C  zinc sulfate 220 (50 Zn) MG capsule Place 1 capsule (220 mg total) into feeding tube daily. 07/20/20  Yes Love, Ivan Anchors, PA-C   No Known Allergies  FAMILY HISTORY:  family history includes Alcohol abuse in his brother; Cancer in his brother and father; Heart disease (age of onset: 42) in his brother; Liver cancer in his brother. SOCIAL HISTORY:  reports that he has never smoked. He has never used smokeless tobacco. He reports current alcohol use. He reports that he does not use drugs.   REVIEW OF SYSTEMS:   UNABLE TO ASSESS DUE TO TRACH IN PLACE AND SIGNIFICANT DYSPHONIA  VITAL SIGNS: Temp:  [98.5 F (36.9 C)-100.4 F (38 C)] 100.4 F (38  C) (11/08 1613) Pulse Rate:  [103-109] 109 (11/08 1613) Resp:  [20] 20 (11/08 1613) BP: (113-132)/(62-70) 126/62 (11/08 1613) SpO2:  [94 %-98 %] 96 % (11/08 1613)  PHYSICAL EXAMINATION: GENERAL:  70 y.o.-year-old patient lying in the bed with no acute distress. Sitting up in bed. Trach capped EYES: Pupils equal, round, reactive to light and accommodation. No scleral icterus. Extraocular muscles intact.  HEENT: Head atraumatic, normocephalic. Oropharynx and nasopharynx clear.  NECK:  Supple, no jugular venous distention. No thyroid enlargement, no tenderness. + Capped Trach LUNGS: Normal breath sounds bilaterally, no wheezing, Positive for  rales,rhonchi or crepitation. No use of accessory muscles of respiration.  CARDIOVASCULAR: S1, S2 normal. No murmurs, rubs, or gallops.  ABDOMEN: Soft, nontender, nondistended. Bowel sounds present. No organomegaly or mass.  EXTREMITIES: Generalized edema. Dependent edema R>L, cyanosis, or clubbing.  NEUROLOGIC: Cranial nerved grossly intact except as noted otherwise.. Sensation diminished in all extremities.. Gait not checked. Dysphonic speech, B/l UE shoulder abduction 1+/5, distally 0/5 with ?trace flicker left wrist flexor. B/l LE: 0/5 proximal to distal PSYCHIATRIC: The patient is alert and oriented x 3.  SKIN: Sacral ulcer present per wound care. Not assessed.   Recent Labs  Lab 07/29/20 0713 07/30/20 0343 07/31/20 0443  NA 144 146* 144  K 4.0 3.8 4.0  CL 108 109 106  CO2 27 29 29   BUN 23 23 25*  CREATININE 0.86 0.86 0.88  GLUCOSE 117* 121* 112*   Recent Labs  Lab 07/29/20 0713 07/30/20 0343 07/31/20 0443  HGB 7.0* 7.4* 7.7*  HCT 22.8* 24.3* 25.2*  WBC 9.4 8.9 9.9  PLT 322 315 303   DG Chest Port 1 View  Result Date: 07/31/2020 CLINICAL DATA:  Worsening shortness of breath today. History of hypertension. EXAM: PORTABLE CHEST 1 VIEW COMPARISON:  07/25/2020 FINDINGS: Tracheostomy in place. Left lung remains largely clear. New  development of infiltrate, volume loss and consolidation in the mid and lower lung on the right. IMPRESSION: New infiltrate, volume loss and consolidation in the mid and lower lung on the right. Electronically Signed   By: Nelson Chimes M.D.   On: 07/31/2020 17:14    ASSESSMENT / PLAN:  Acute on Chronic Hypoxic Respiratory Failure secondary to recurrent aspiration pneumonia, possible mucus plug on RLL S/p trach placement 8/4 -Supplemental O2 as needed to maintain O2 saturations 88 to 92% -High risk for intubation -Follow intermittent ABG and chest x-ray as needed -Repeat CXR on 11/8 reviewed as above -As needed bronchodilators -Aggressive pulmonary hygiene suctioning PRN -Aspiration precautions -Plan Bronchoscopy  Aspiration pneumonia Completed 7 day course of treatment with Unasyn. Spike a temp on 11/6 while on abx -Monitor fever curve -Monitor white count -Follow cultures  Episodic neurogenic hypotension Likely 2/2 spinal cord injury. Appears hemodynamically stable -Continue Midodrine and Florinef as scheduled -SCDs for venous return  Dysphagia s/s SCI S/p PEG placement 8/4 -Hold TF  due to aspiration -Aspiration precautions -IVFs hydration while NPO   Iron Deficiency Anemia with baseline hgb 8.0 S/p Venofer infusion 11/6 -Continue iron supplement -Transfuse for hgb<7.0    Transaminitis Patient noted to have mild elevated LFTs, appears to have been elevated prior. 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  [facility]  Medical Director [place] Director [place] Department

## 2020-07-31 NOTE — Care Management Important Message (Signed)
Important Message  Patient Details  Name: Ryan Day MRN: [id number] Date of Birth: 05-28-1951   Medicare Important Message Given:  Yes     Juliann Pulse A [name] 07/31/2020, 1:58 PM

## 2020-07-31 NOTE — Progress Notes (Signed)
PROGRESS NOTE    Ryan Day   MWN:[id number]  DOB: 06-17-1951  PCP: Valerie Roys, DO    DOA: 07/25/2020 LOS: 6   Brief Narrative   HPI on admission: "Ryan Day is a 69 y.o. 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 After fall pt was managed at [facility] in [place] for 2 months  And has rehab at [facility]. Pt had  Trach and peg during [facility] stay . Pt is alert and awake and oriented x 3. Pt has been coughing and copious amount of sputum. "  Admitted to hospitalist service with aspiration pneumonia.     Assessment & Plan   Principal Problem:   Aspiration pneumonia ([place]) Active Problems:   Hypertension   Quadriplegia ([place])   Sacral decubitus ulcer, stage II (HCC)   Goals of care, counseling/discussion   Aspiration pneumonia - present on admission with SOB, copious sputum production, difficulty clearing secretions.  Chest xray consistent with aspiration PNA. 11/4 - continues to have copious thick secretions, but none coming out or around trach. 11/5 - tachypnea and hypoxic this morning, placed on nasal cannula 11/6 - fever 8:30 PM last night, but procal is <0.10.  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  Secretions continue to be clear and non-purulent. 11/7 - afebrile, lot of secretions but improving and remain non-purulent 11/8 - now off antibiotics, afebrile --Completed course of Unasyn on 11/7.  Monitor clinically 24 hours given recurrent aspiration. --Aspiration precautions --Diligent oral care --SLP evaluation - continues NPO status, see their note for complete details. --RT following --PRN Tylenol if fevers --PRN antitussives --scopolamine patch to dry up secretions  Hypernatremia - Resolved.  Monitor BMP. -- Continue free water flushes at 400cc q4h & flushes  before and after TF's per orders.  Hypotension - likely multifactorial due to sepsis/infection and hypovolemia, and replated to autonominc dysfunction with spinal cord injury.  Family report hypotension at home. --continue Florinef and midodrine  Iron deficiency anemia - monitor CBC.  Transfuse for Hbg < 7.0.  Continue iron supplement.  Venofer infusion 11/6. Hbg today is 7.0, no evidence of bleeding.  Low iron on anemia panel despite PO supplement. --Repeat Venofer infusion today.  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  Follows with Dr. Richardson Landry. --Tube feeds with free water flushes per orders. If abdominal distention, decrease volume & increase frequent of feeds - see order comments for details. --Routine trach care --Continue baclofen --Diligent bowel regimen - suppository and manual stimulation at bedtime.  PRN suppository if needed during day.  Sacral decubitus ulcer, stage II  - POA.  WOC following.  Due to quadriplegic status and immobillity.   --Reposition patient at least every 2 hours. Pressure Injury 07/26/20 Sacrum Unstageable - Full thickness tissue loss in which the base of the injury is covered by slough (yellow, tan, gray, green or brown) and/or eschar (tan, brown or black) in the wound bed. (Active)  07/26/20 0513  Location: Sacrum  Location Orientation:   Staging: Unstageable - Full thickness tissue loss in which the base of the injury is covered by slough (yellow, tan, gray, green or brown) and/or eschar (tan, brown or black) in the wound bed.  Wound Description (Comments):   Present on Admission: Yes    Hx of Hypertension - not on  antihypertensives, but on Florinef  Hx of depression - continue Prozac  Right shoulder pain - chronic.  Lidoderm patches.     DVT prophylaxis: enoxaparin (LOVENOX) injection 40 mg Start: 07/26/20 1000 SCDs Start: 07/25/20 2243   Diet:  Diet Orders (From admission, onward)    Start      Ordered   07/25/20 2244  Diet NPO time specified  Diet effective now        07/25/20 2244            Code Status: Full Code    Subjective 07/31/20    Pt seen this AM with wife at bedside.  Pt doing well.  Says spasticity better after baclofen increased yesterday.  Says his abdomen feeling better.  Using oral suction on his own and states secretions have been better.  No fever/chills or other acute complaints at this time.  Agreeable with possible d/c tomorrow if doing well.    Disposition Plan & Communication   Status is: Inpatient  Inpatient remains appropriate status due to severity of illness.  Due to recurrent aspiration and high risk of clinical deterioration, will monitor today off antibiotics and likely d/c home tomorrow if stable and afebrile.  Dispo: The patient is from: Home              Anticipated d/c is to: Home               Anticipated d/c date is: 1 days              Patient currently is not medically stable for d/c.  Family Communication: wife was present at bedside during encounter today.     Consults, Procedures, Significant Events   Consultants:   [facility]  Palliative care  Procedures:   none  Antimicrobials:  Anti-infectives (From admission, onward)   Start     Dose/Rate Route Frequency Ordered Stop   07/26/20 1600  Ampicillin-Sulbactam (UNASYN) 3 g in sodium chloride 0.9 % 100 mL IVPB        3 g 200 mL/hr over 30 Minutes Intravenous Every 6 hours 07/26/20 1130 07/30/20 2200   07/26/20 1200  vancomycin (VANCOCIN) IVPB 1000 mg/200 mL premix  Status:  Discontinued        1,000 mg 200 mL/hr over 60 Minutes Intravenous Every 12 hours 07/25/20 2304 07/26/20 1106   07/25/20 2315  vancomycin (VANCOREADY) IVPB 2000 mg/400 mL       "Followed by" Linked Group Details   2,000 mg 200 mL/hr over 120 Minutes Intravenous  Once 07/25/20 2301 07/26/20 0205   07/25/20 2315  vancomycin (VANCOREADY) IVPB 500 mg/100 mL       "Followed by" Linked Group Details    500 mg 100 mL/hr over 60 Minutes Intravenous  Once 07/25/20 2301 07/26/20 0323   07/25/20 1500  piperacillin-tazobactam (ZOSYN) IVPB 3.375 g  Status:  Discontinued        3.375 g 12.5 mL/hr over 240 Minutes Intravenous Every 8 hours 07/25/20 1446 07/26/20 1129   07/25/20 1430  Ampicillin-Sulbactam (UNASYN) 3 g in sodium chloride 0.9 % 100 mL IVPB  Status:  Discontinued        3 g 200 mL/hr over 30 Minutes Intravenous  Once 07/25/20 1418 07/25/20 1446        Objective   Vitals:   07/30/20 1545 07/30/20 2028 07/31/20 0456 07/31/20 1350  BP: 128/72 132/70 113/63 118/62  Pulse: 88  (!) 103 (!) 106  Resp: 20  20   Temp:  99.4 F (37.4 C) 99 F (37.2 C) 98.8 F (37.1 C) 98.5 F (36.9 C)  TempSrc: Oral Oral Oral Oral  SpO2: 100%  98% 94%  Weight:      Height:        Intake/Output Summary (Last 24 hours) at 07/31/2020 1502 Last data filed at 07/31/2020 0503 Gross per 24 hour  Intake 587.16 ml  Output 2500 ml  Net -1912.84 ml   Filed Weights   07/25/20 1300  Weight: 108 kg    Physical Exam:  General exam: awake, alert, no acute distress HEENT: trach in place capped, no visible secretions, [place] on extension arm pt able to use independently  Respiratory system: CTAB, no secretion sounds heard, no wheezes or rhonchi, normal respiratory effort, on 2 L/min nasal cannula oxygen. Cardiovascular system: normal S1/S2, RRR, no pedal edema.   Gastrointestinal system: soft, NT, PEG tube in place Extremities: No edema, offloading boots in place, SCD's    Labs   Data Reviewed: I have personally reviewed following labs and imaging studies  CBC: Recent Labs  Lab 07/25/20 1308 07/25/20 1308 07/27/20 0726 07/28/20 0605 07/29/20 0713 07/30/20 0343 07/31/20 0443  WBC 8.9   < > 8.5 7.6 9.4 8.9 9.9  NEUTROABS 5.9  --  5.8  --  6.6 5.9  --   HGB 8.6*   < > 7.9* 7.1* 7.0* 7.4* 7.7*  HCT 27.1*   < > 25.6* 23.1* 22.8* 24.3* 25.2*  MCV 88.6   < > 89.2 89.2 90.8 89.7 89.7  PLT 421*    < > 321 409* 322 315 303   < > = values in this interval not displayed.   Basic Metabolic Panel: Recent Labs  Lab 07/27/20 0726 07/28/20 0605 07/29/20 0713 07/30/20 0343 07/31/20 0443  NA 148* 149* 144 146* 144  K 3.8 3.7 4.0 3.8 4.0  CL 112* 113* 108 109 106  CO2 27 26 27 29 29   GLUCOSE 102* 97 117* 121* 112*  BUN 26* 27* 23 23 25*  CREATININE 0.76 0.80 0.86 0.86 0.88  CALCIUM 9.3 9.6 9.4 9.4 9.5  MG  --   --  2.3  --   --    GFR: Estimated Creatinine Clearance: 109.9 mL/min (by C-G formula based on SCr of 0.88 mg/dL). Liver Function Tests: Recent Labs  Lab 07/25/20 1308  AST 47*  ALT 64*  ALKPHOS 162*  BILITOT 0.7  PROT 8.2*  ALBUMIN 2.3*   No results for input(s): LIPASE, AMYLASE in the last 168 hours. No results for input(s): AMMONIA in the last 168 hours. Coagulation Profile: No results for input(s): INR, PROTIME in the last 168 hours. Cardiac Enzymes: No results for input(s): CKTOTAL, CKMB, CKMBINDEX, TROPONINI in the last 168 hours. BNP (last 3 results) No results for input(s): PROBNP in the last 8760 hours. HbA1C: No results for input(s): HGBA1C in the last 72 hours. CBG: Recent Labs  Lab 07/26/20 0421  GLUCAP 170*   Lipid Profile: No results for input(s): CHOL, HDL, LDLCALC, TRIG, CHOLHDL, LDLDIRECT in the last 72 hours. Thyroid Function Tests: No results for input(s): TSH, T4TOTAL, FREET4, T3FREE, THYROIDAB in the last 72 hours. Anemia Panel: No results for input(s): VITAMINB12, FOLATE, FERRITIN, TIBC, IRON, RETICCTPCT in the last 72 hours. Sepsis Labs: Recent Labs  Lab 07/25/20 1319 07/25/20 1533 07/25/20 2205 07/26/20 0318 07/29/20 0713  PROCALCITON <0.10  --   --   --  <0.10  LATICACIDVEN  --  1.1 1.4 1.5  --  Recent Results (from the past 240 hour(s))  Respiratory Panel by RT PCR (Flu A&B, Covid) - Nasopharyngeal Swab     Status: None   Collection Time: 07/23/20  3:23 AM   Specimen: Nasopharyngeal Swab  Result Value Ref Range  Status   SARS Coronavirus 2 by RT PCR NEGATIVE NEGATIVE Final    Comment: (NOTE) SARS-CoV-2 target nucleic acids are NOT DETECTED.  The SARS-CoV-2 RNA is generally detectable in upper respiratoy specimens during the acute phase of infection. The lowest concentration of SARS-CoV-2 viral copies this assay can detect is 131 copies/mL. A negative result does not preclude SARS-Cov-2 infection and should not be used as the sole basis for treatment or other patient management decisions. A negative result may occur with  improper specimen collection/handling, submission of specimen other than nasopharyngeal swab, presence of viral mutation(s) within the areas targeted by this assay, and inadequate number of viral copies (<131 copies/mL). A negative result must be combined with clinical observations, patient history, and epidemiological information. The expected result is Negative.  Fact Sheet for Patients:  PinkCheek.be  Fact Sheet for Healthcare Providers:  GravelBags.it  This test is no t yet approved or cleared by the [place] FDA and  has been authorized for detection and/or diagnosis of SARS-CoV-2 by FDA under an Emergency Use Authorization (EUA). This EUA will remain  in effect (meaning this test can be used) for the duration of the COVID-19 declaration under Section 564(b)(1) of the Act, 21 U.S.C. section 360bbb-3(b)(1), unless the authorization is terminated or revoked sooner.     Influenza A by PCR NEGATIVE NEGATIVE Final   Influenza B by PCR NEGATIVE NEGATIVE Final    Comment: (NOTE) The Xpert Xpress SARS-CoV-2/FLU/RSV assay is intended as an aid in  the diagnosis of influenza from Nasopharyngeal swab specimens and  should not be used as a sole basis for treatment. Nasal washings and  aspirates are unacceptable for Xpert Xpress SARS-CoV-2/FLU/RSV  testing.  Fact Sheet for  Patients: PinkCheek.be  Fact Sheet for Healthcare Providers: GravelBags.it  This test is not yet approved or cleared by the [place] FDA and  has been authorized for detection and/or diagnosis of SARS-CoV-2 by  FDA under an Emergency Use Authorization (EUA). This EUA will remain  in effect (meaning this test can be used) for the duration of the  Covid-19 declaration under Section 564(b)(1) of the Act, 21  U.S.C. section 360bbb-3(b)(1), unless the authorization is  terminated or revoked. Performed at [facility], [address] [place],  [address]   Respiratory Panel by RT PCR (Flu A&B, Covid) - Nasopharyngeal Swab     Status: None   Collection Time: 07/25/20  2:11 PM   Specimen: Nasopharyngeal Swab  Result Value Ref Range Status   SARS Coronavirus 2 by RT PCR NEGATIVE NEGATIVE Final    Comment: (NOTE) SARS-CoV-2 target nucleic acids are NOT DETECTED.  The SARS-CoV-2 RNA is generally detectable in upper respiratoy specimens during the acute phase of infection. The lowest concentration of SARS-CoV-2 viral copies this assay can detect is 131 copies/mL. A negative result does not preclude SARS-Cov-2 infection and should not be used as the sole basis for treatment or other patient management decisions. A negative result may occur with  improper specimen collection/handling, submission of specimen other than nasopharyngeal swab, presence of viral mutation(s) within the areas targeted by this assay, and inadequate number of viral copies (<131 copies/mL). A negative result must be combined with clinical observations, patient history, and epidemiological  information. The expected result is Negative.  Fact Sheet for Patients:  PinkCheek.be  Fact Sheet for Healthcare Providers:  GravelBags.it  This test is no t yet approved or cleared by the [place] FDA and  has been authorized for detection and/or diagnosis of SARS-CoV-2 by FDA under an Emergency Use Authorization (EUA). This EUA will remain  in effect (meaning this test can be used) for the duration of the COVID-19 declaration under Section 564(b)(1) of the Act, 21 U.S.C. section 360bbb-3(b)(1), unless the authorization is terminated or revoked sooner.     Influenza A by PCR NEGATIVE NEGATIVE Final   Influenza B by PCR NEGATIVE NEGATIVE Final    Comment: (NOTE) The Xpert Xpress SARS-CoV-2/FLU/RSV assay is intended as an aid in  the diagnosis of influenza from Nasopharyngeal swab specimens and  should not be used as a sole basis for treatment. Nasal washings and  aspirates are unacceptable for Xpert Xpress SARS-CoV-2/FLU/RSV  testing.  Fact Sheet for Patients: PinkCheek.be  Fact Sheet for Healthcare Providers: GravelBags.it  This test is not yet approved or cleared by the [place] FDA and  has been authorized for detection and/or diagnosis of SARS-CoV-2 by  FDA under an Emergency Use Authorization (EUA). This EUA will remain  in effect (meaning this test can be used) for the duration of the  Covid-19 declaration under Section 564(b)(1) of the Act, 21  U.S.C. section 360bbb-3(b)(1), unless the authorization is  terminated or revoked. Performed at [facility], [address] [place], [place] [address]   Blood culture (routine x 2)     Status: None (Preliminary result)   Collection Time: 07/25/20  3:34 PM   Specimen: BLOOD  Result Value Ref Range Status   Specimen Description BLOOD BLOOD RIGHT FOREARM  Final   Special Requests   Final    BOTTLES DRAWN AEROBIC AND ANAEROBIC Blood Culture adequate volume   Culture   Final    NO GROWTH 4 DAYS Performed at [facility], [address] [place], [place] [address]    Report Status PENDING  Incomplete  Blood culture (routine x 2)      Status: None (Preliminary result)   Collection Time: 07/25/20  3:34 PM   Specimen: BLOOD  Result Value Ref Range Status   Specimen Description BLOOD BLOOD LEFT FOREARM  Final   Special Requests   Final    BOTTLES DRAWN AEROBIC AND ANAEROBIC Blood Culture adequate volume   Culture   Final    NO GROWTH 4 DAYS Performed at [facility], [address], [place], [place] [address]    Report Status PENDING  Incomplete  Culture, respiratory (non-expectorated)     Status: None   Collection Time: 07/25/20  6:36 PM   Specimen: Tracheal Aspirate; Respiratory  Result Value Ref Range Status   Specimen Description   Final    TRACHEAL ASPIRATE Performed at [facility], [address] [place], [place] [address]    Special Requests   Final    NONE Performed at [facility], [address] [place], [place] [address]    Gram Stain   Final    MODERATE WBC PRESENT,BOTH PMN AND MONONUCLEAR FEW GRAM POSITIVE COCCI IN CHAINS FEW GRAM POSITIVE RODS RARE GRAM NEGATIVE RODS    Culture   Final    FEW Normal respiratory flora-no Staph aureus or Pseudomonas seen Performed at [facility], [place] [address] [place], [place] [address]    Report Status 07/28/2020  FINAL  Final  MRSA PCR Screening     Status: None   Collection Time: 07/26/20  4:51 AM   Specimen: Nasopharyngeal  Result Value Ref Range Status   MRSA by PCR NEGATIVE NEGATIVE Final    Comment:        The GeneXpert MRSA Assay (FDA approved for NASAL specimens only), is one component of a comprehensive MRSA colonization surveillance program. It is not intended to diagnose MRSA infection nor to guide or monitor treatment for MRSA infections. Performed at [facility], [address], [place], [place] [address]       Imaging Studies   No results found.   Medications   Scheduled Meds: . ascorbic acid  500 mg Per Tube BID  . baclofen  10 mg Per Tube TID  . bisacodyl  10 mg Rectal QHS   . chlorhexidine  15 mL Mouth Rinse BID  . Chlorhexidine Gluconate Cloth  6 each Topical Daily  . collagenase   Topical Daily  . enoxaparin (LOVENOX) injection  40 mg Subcutaneous Q24H  . famotidine  20 mg Per Tube Daily  . feeding supplement (OSMOLITE 1.5 CAL)  237 mL Per Tube Q3H  . feeding supplement (PROSource TF)  45 mL Per Tube BID  . ferrous sulfate  75 mg Per Tube Daily  . fludrocortisone  0.2 mg Per Tube Daily  . FLUoxetine  40 mg Per Tube Daily  . free water  400 mL Per Tube Q4H  . guaiFENesin  20 mL Per Tube Q6H  . lidocaine  1 patch Transdermal Q24H  . mouth rinse  15 mL Mouth Rinse q12n4p  . midodrine  10 mg Per Tube TID WC  . multivitamin  5 mL Per Tube Daily  . nutrition supplement (JUVEN)  1 packet Per Tube BID BM  . polycarbophil  625 mg Per Tube Daily  . saccharomyces boulardii  250 mg Per Tube BID  . scopolamine  1 patch Transdermal Q72H  . simethicone  80 mg Per Tube QID  . traZODone  50 mg Per Tube QHS   Continuous Infusions: . sodium chloride Stopped (07/25/20 2347)       LOS: 6 days    Time spent: 25 minutes with > 50% spent in coordination of care and direct patient contact.    Ezekiel Slocumb, DO [facility]  07/31/2020, 3:02 PM    If 7PM-7AM, please contact night-coverage. How to contact the [facility] Attending or Consulting provider [place] or covering provider during after hours [place], for this patient?    1. Check the care team in [facility] and look for a) attending/consulting TRH provider listed and b) the [facility] team listed 2. Log into [email or web address] and use [place]'s universal password to access. If you do not have the password, please contact the hospital operator. 3. Locate the [facility] provider you are looking for under [facility] and page to a number that you can be directly reached. 4. If you still have difficulty reaching the provider, please page the [facility] (Director on Call) for the Hospitalists listed on amion for assistance.

## 2020-07-31 NOTE — Progress Notes (Signed)
   07/31/20 1350  Vitals  Temp 98.5 F (36.9 C)  Temp Source Oral  BP 118/62  MAP (mmHg) 79  BP Method Automatic  Pulse Rate (!) 106  Resp (!) 36  Level of Consciousness  Level of Consciousness Alert  Oxygen Therapy  SpO2 94 %  O2 Device Nasal Cannula  O2 Flow Rate (L/min) 4 L/min  patient oxygen dropped to 88% on 2 L and did not respond to suctioning.  Oxygen increased to 4 L via [place].  MD notified of increased oxygen need and Respitory evaluated patient.  Wife at bedside and updated.  Will continue to monitor.

## 2020-08-01 ENCOUNTER — Inpatient Hospital Stay: Payer: BC Managed Care – PPO

## 2020-08-01 DIAGNOSIS — G8250 Quadriplegia, unspecified: Secondary | ICD-10-CM | POA: Diagnosis not present

## 2020-08-01 DIAGNOSIS — Z7189 Other specified counseling: Secondary | ICD-10-CM | POA: Diagnosis not present

## 2020-08-01 DIAGNOSIS — J690 Pneumonitis due to inhalation of food and vomit: Secondary | ICD-10-CM | POA: Diagnosis not present

## 2020-08-01 LAB — BASIC METABOLIC PANEL
Anion gap: 11 (ref 5–15)
Anion gap: 9 (ref 5–15)
BUN: 27 mg/dL — ABNORMAL HIGH (ref 8–23)
BUN: 28 mg/dL — ABNORMAL HIGH (ref 8–23)
CO2: 29 mmol/L (ref 22–32)
CO2: 29 mmol/L (ref 22–32)
Calcium: 9.2 mg/dL (ref 8.9–10.3)
Calcium: 9.3 mg/dL (ref 8.9–10.3)
Chloride: 108 mmol/L (ref 98–111)
Chloride: 110 mmol/L (ref 98–111)
Creatinine, Ser: 0.93 mg/dL (ref 0.61–1.24)
Creatinine, Ser: 0.95 mg/dL (ref 0.61–1.24)
GFR, Estimated: >60 mL/min (ref >60)
GFR, Estimated: >60 mL/min (ref >60)
Glucose, Bld: 156 mg/dL — ABNORMAL HIGH (ref 70–99)
Glucose, Bld: 208 mg/dL — ABNORMAL HIGH (ref 70–99)
Potassium: 3.4 mmol/L — ABNORMAL LOW (ref 3.5–5.1)
Potassium: 3.6 mmol/L (ref 3.5–5.1)
Sodium: 148 mmol/L — ABNORMAL HIGH (ref 135–145)
Sodium: 148 mmol/L — ABNORMAL HIGH (ref 135–145)

## 2020-08-01 LAB — CBC
HCT: 24.4 % — ABNORMAL LOW (ref 39.0–52.0)
HCT: 24.4 % — ABNORMAL LOW (ref 39.0–52.0)
Hemoglobin: 7.5 g/dL — ABNORMAL LOW (ref 13.0–17.0)
Hemoglobin: 7.6 g/dL — ABNORMAL LOW (ref 13.0–17.0)
MCH: 27.9 pg (ref 26.0–34.0)
MCH: 28.3 pg (ref 26.0–34.0)
MCHC: 30.7 g/dL (ref 30.0–36.0)
MCHC: 31.1 g/dL (ref 30.0–36.0)
MCV: 90.7 fL (ref 80.0–100.0)
MCV: 90.7 fL (ref 80.0–100.0)
Platelets: 253 10*3/uL (ref 150–400)
Platelets: 270 10*3/uL (ref 150–400)
RBC: 2.69 MIL/uL — ABNORMAL LOW (ref 4.22–5.81)
RBC: 2.69 MIL/uL — ABNORMAL LOW (ref 4.22–5.81)
RDW: 17 % — ABNORMAL HIGH (ref 11.5–15.5)
RDW: 17.1 % — ABNORMAL HIGH (ref 11.5–15.5)
WBC: 12.5 10*3/uL — ABNORMAL HIGH (ref 4.0–10.5)
WBC: 12.9 10*3/uL — ABNORMAL HIGH (ref 4.0–10.5)
nRBC: 0.2 % (ref 0.0–0.2)
nRBC: 0.2 % (ref 0.0–0.2)

## 2020-08-01 LAB — MAGNESIUM: Magnesium: 2.5 mg/dL — ABNORMAL HIGH (ref 1.7–2.4)

## 2020-08-01 IMAGING — DX DG CHEST 1V PORT
1 series · 2 of 2 positions shown · non-contrast
Comparison: [DATE]

CLINICAL DATA: Aspiration pneumonia.  Hypoxia and tachypnea.

EXAM:
PORTABLE CHEST 1 VIEW

[Series 1: chest ap · 0.14mm/px · 2 of 2 slices shown]
[im 1/2]
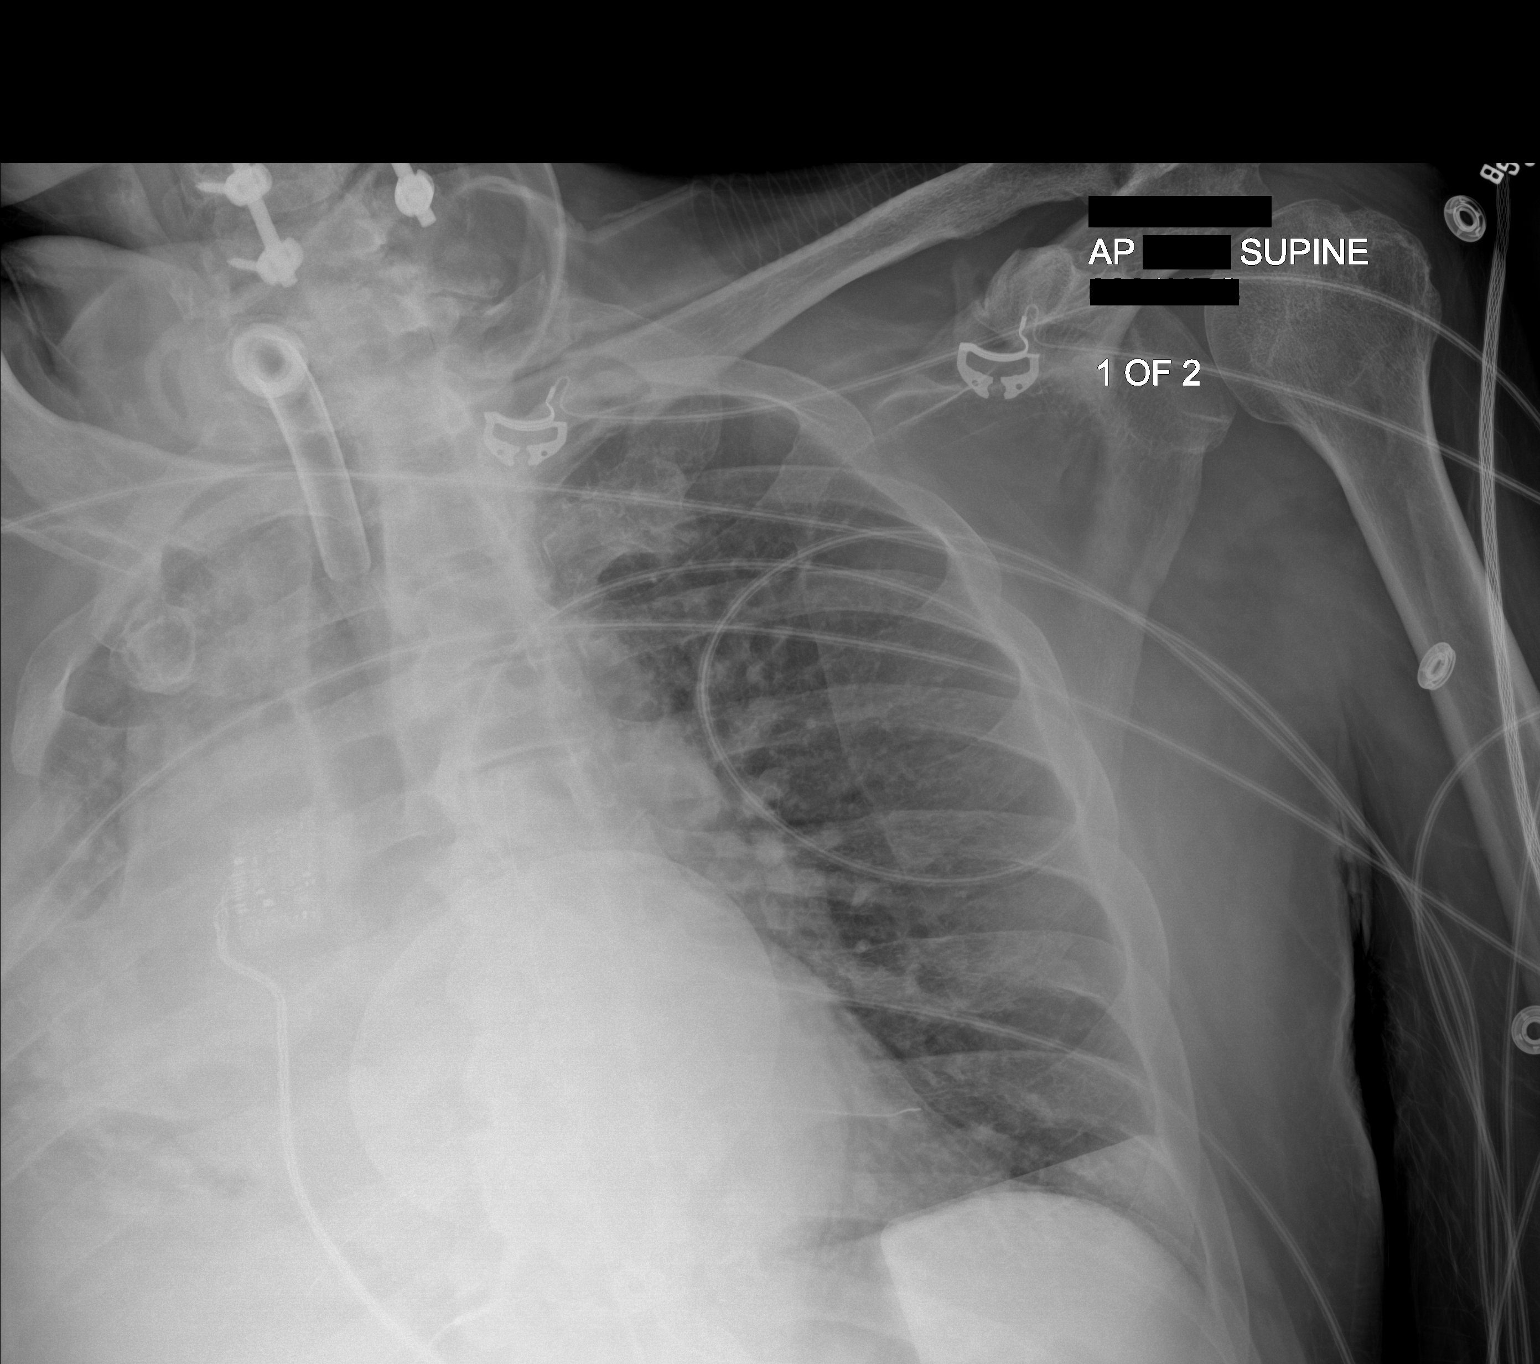
[im 2/2]
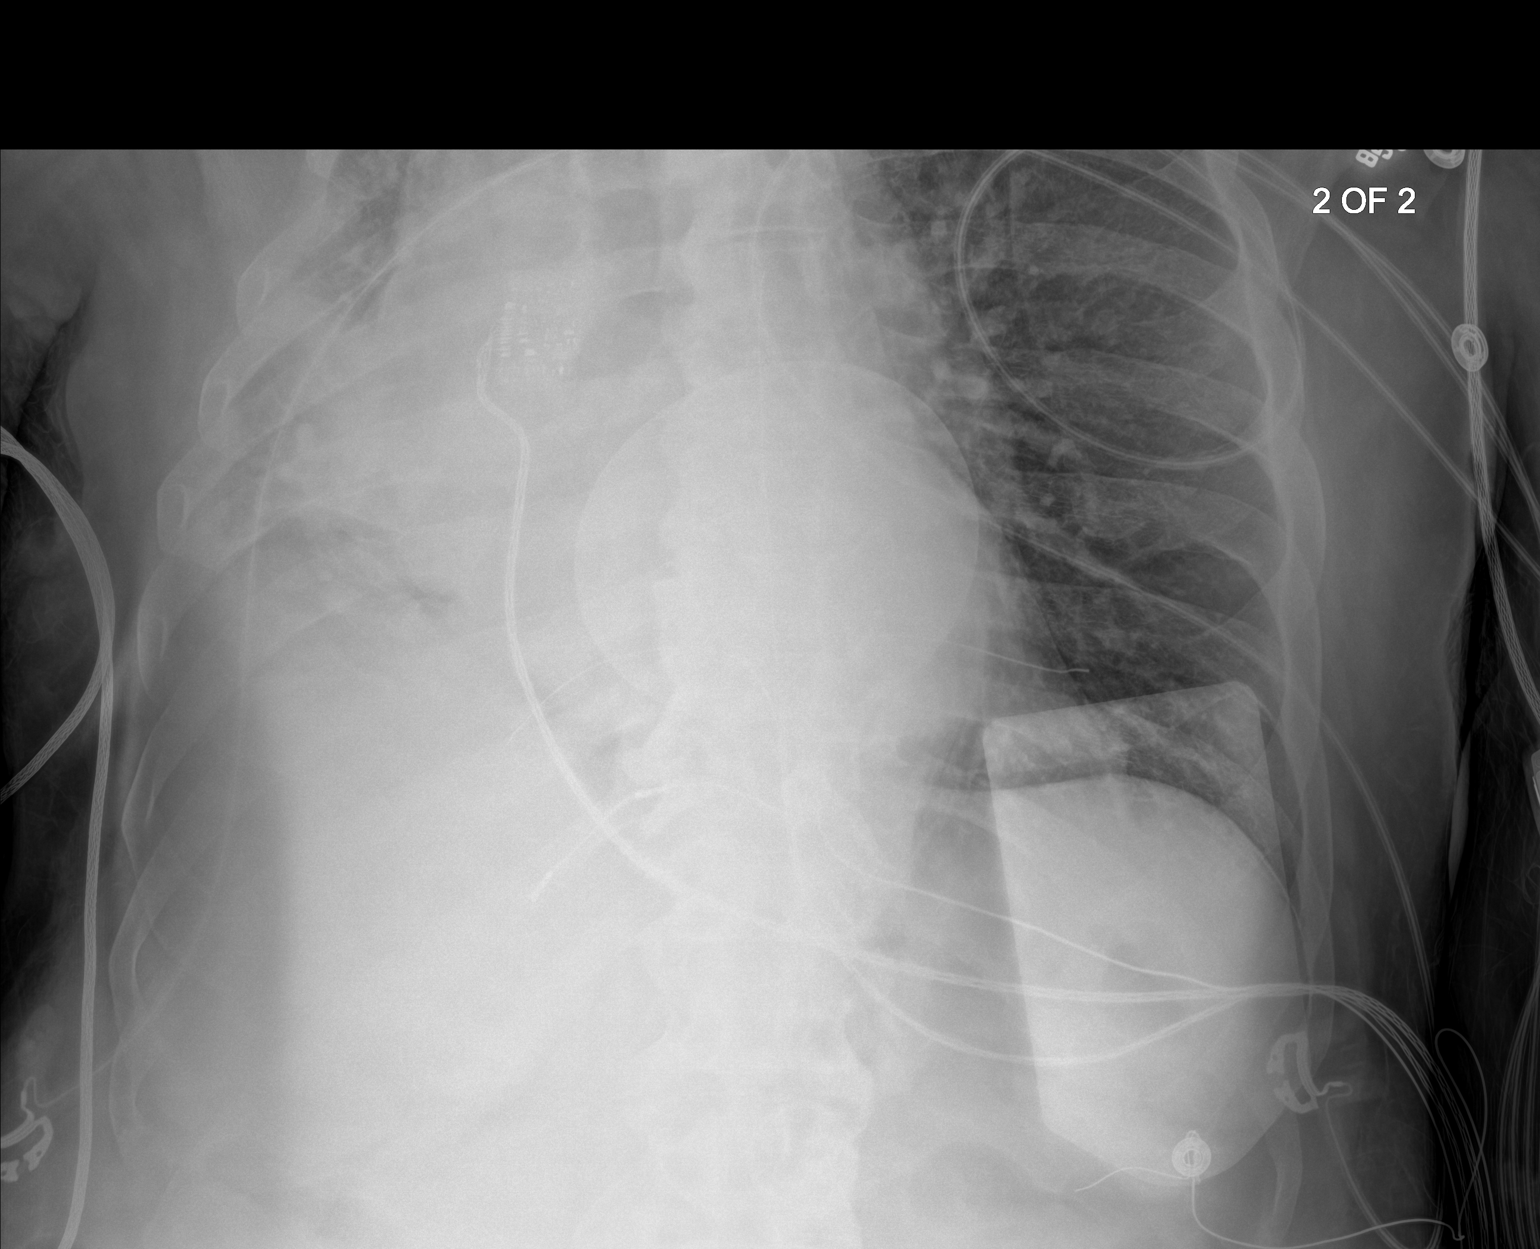

[2 of 2 positions shown; findings below may reference images not displayed]

FINDINGS: Progressive collapse on the right with increased midline shift to
the right. Small right effusion.

Left lung remains clear.  Tracheostomy remains in good position.
IMPRESSION: Progressive airspace disease and volume loss on the right compatible
with partial collapse of the right lung. Right bronchial cut off
suggesting mucous plugging

## 2020-08-01 IMAGING — DX DG CHEST 1V PORT
1 series · 1 of 1 positions shown · non-contrast
Comparison: Yesterday

CLINICAL DATA: Acute respiratory failure with hypoxia

EXAM:
PORTABLE CHEST 1 VIEW

[chest ap]
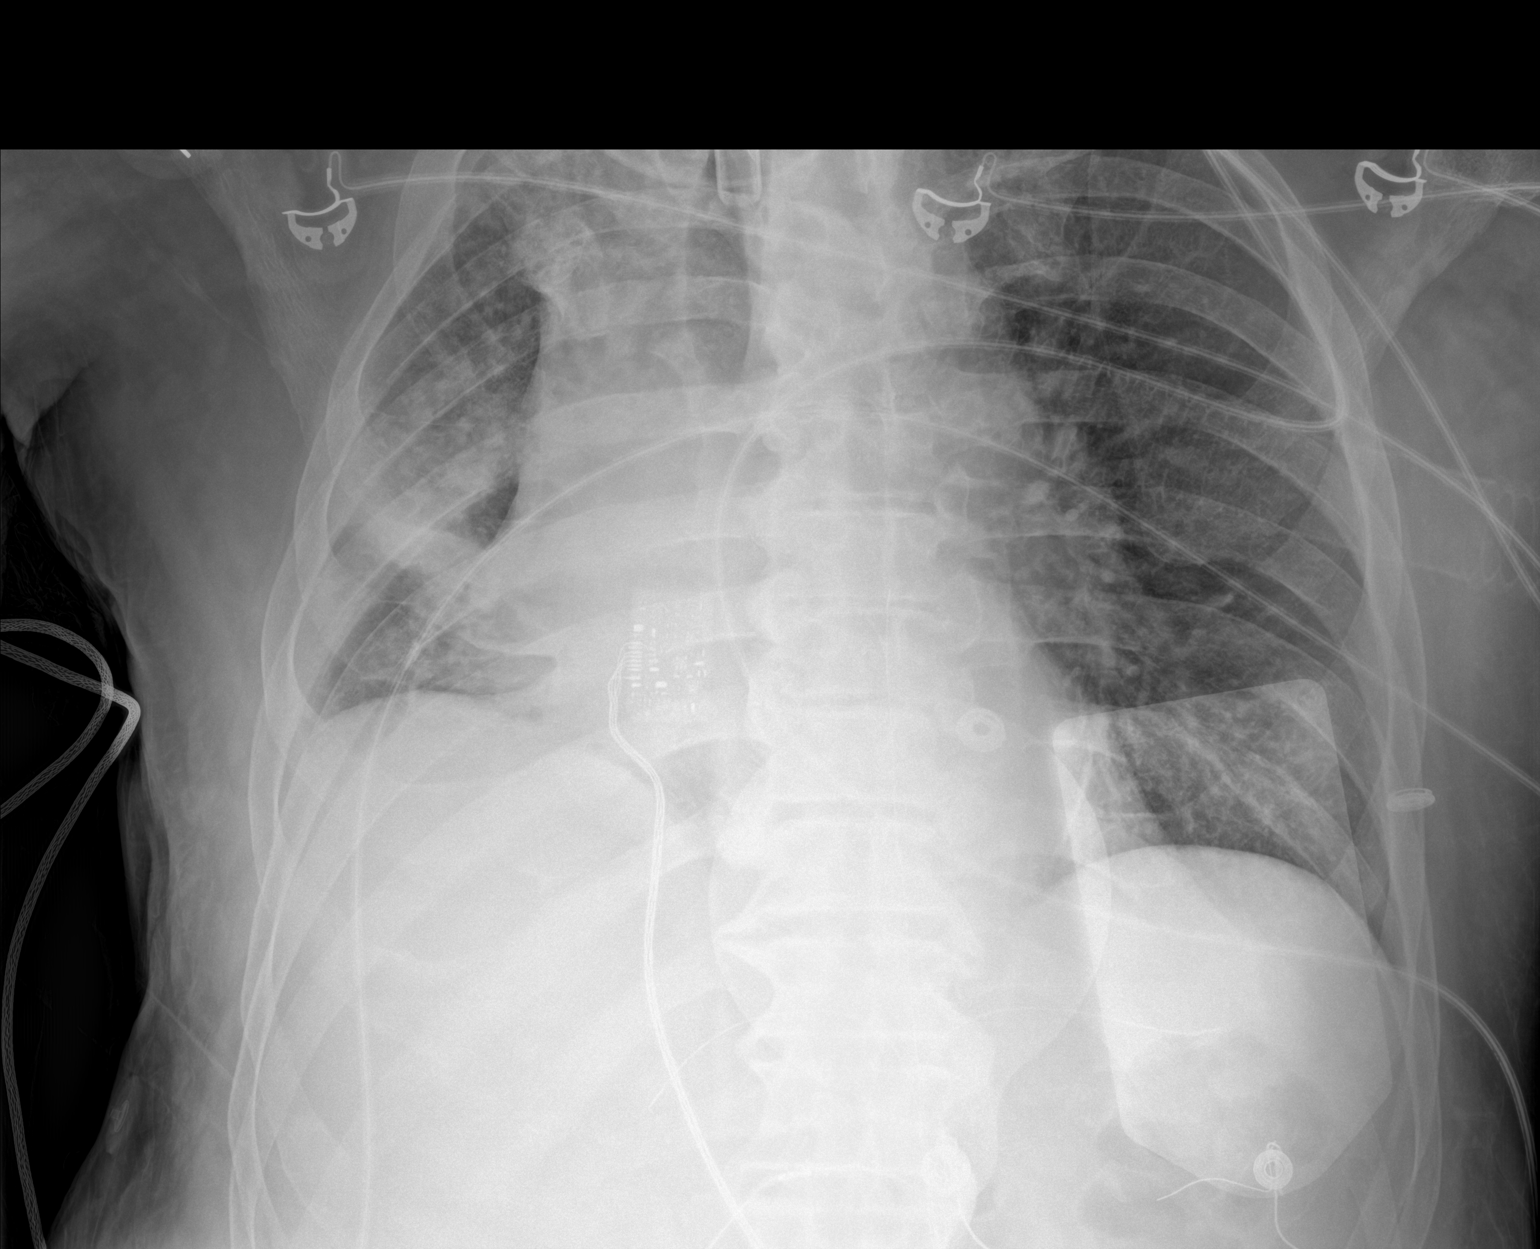

[1 of 1 positions shown; findings below may reference images not displayed]

FINDINGS: Streaky and hazy opacity on the right with volume loss. There was a
chest CT at outside hospital [DATE] with no mention of hilar or
pulmonary mass. Relatively clear left lung. Cardiac enlargement. No
visible effusion or pneumothorax. Tracheostomy tube in place.
IMPRESSION: Unchanged right pulmonary opacification with volume loss.

## 2020-08-01 MED ORDER — METHYLPREDNISOLONE SODIUM SUCC 40 MG IJ SOLR
40.0000 mg | Freq: Two times a day (BID) | INTRAMUSCULAR | Status: DC
  Administered 2020-08-02 – 2020-08-04 (×6): 40 mg via INTRAVENOUS
  Filled 2020-08-01 (×6): qty 1

## 2020-08-01 MED ORDER — PIPERACILLIN-TAZOBACTAM 3.375 G IVPB
3.3750 g | Freq: Three times a day (TID) | INTRAVENOUS | Status: DC
  Administered 2020-08-01 – 2020-08-04 (×8): 3.375 g via INTRAVENOUS
  Filled 2020-08-01 (×9): qty 50

## 2020-08-01 MED ORDER — POTASSIUM CL IN DEXTROSE 5% 20 MEQ/L IV SOLN
20.0000 meq | INTRAVENOUS | Status: DC
  Administered 2020-08-01 – 2020-08-05 (×6): 20 meq via INTRAVENOUS
  Filled 2020-08-01 (×9): qty 1000

## 2020-08-01 NOTE — TOC Progression Note (Signed)
Transition of Care [facility]) - Progression Note    Patient Details  Name: Ryan Day MRN: [id number] Date of Birth: 1950-10-03  Transition of Care [facility]) CM/SW [place], LCSW Phone Number: 08/01/2020, 8:19 AM  Clinical Narrative:  Faxed referral to [facility] to see if they can meet patient's nursing needs. They are a private pay service but may be able to consider an LOG if family unable to afford and [facility] leadership agreeable.   Expected Discharge Plan and Services                                                 Social Determinants of Health (SDOH) Interventions    Readmission Risk Interventions No flowsheet data found.

## 2020-08-01 NOTE — Progress Notes (Signed)
Pharmacy Antibiotic Note  Ryan Day is a 69 y.o. male admitted on 07/25/2020. Pharmacy has been consulted for Zosyn dosing.  Plan: Zosyn 3.375 g IV q8h extended infusion  Height: _0  (195.6 cm) Weight: 108 kg (238 lb 1.6 oz) IBW/kg (Calculated) : 89.1  Temp (24hrs), Avg:99.2 F (37.3 C), Min:98 F (36.7 C), Max:101.9 F (38.8 C)  Recent Labs  Lab 07/25/20 1533 07/25/20 2205 07/26/20 0318 07/27/20 0726 07/29/20 0713 07/30/20 0343 07/31/20 0443 08/01/20 0002 08/01/20 0344  WBC  --   --   --    < > 9.4 8.9 9.9 12.9* 12.5*  CREATININE  --   --   --    < > 0.86 0.86 0.88 0.93 0.95  LATICACIDVEN 1.1 1.4 1.5  --   --   --   --   --   --    < > = values in this interval not displayed.    Estimated Creatinine Clearance: 101.8 mL/min (by C-G formula based on SCr of 0.95 mg/dL).    No Known Allergies  Antimicrobials this admission: Zosyn 11/2 >> 11/3 Unasyn 11/3 >> 11/7 Zosyn 11/9 >>   Microbiology results: 11/2 BCx: no growth 11/2 Resp Cx: normal flora 11/8 BAL: no growth 11/3 MRSA PCR: negative  Thank you for allowing pharmacy to be a part of this patient's care.  Tawnya Crook, PharmD 08/01/2020 2:02 PM

## 2020-08-01 NOTE — Progress Notes (Addendum)
PROGRESS NOTE    Ryan Day   BPZ:[id number]  DOB: [birth year]  PCP: Valerie Roys, DO    DOA: 07/25/2020 LOS: 7   Brief Narrative   HPI on admission: "Ryan Day is a 69 y.o. 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 After fall pt was managed at [facility] in [place] for 2 months  And has rehab at [facility]. Pt had  Trach and peg during [facility] stay . Pt is alert and awake and oriented x 3. Pt has been coughing and copious amount of sputum. "  Admitted to hospitalist service with aspiration pneumonia.     Assessment & Plan   Principal Problem:   Aspiration pneumonia ([place]) Active Problems:   Hypertension   Quadriplegia ([place])   Sacral decubitus ulcer, stage II (HCC)   Goals of care, counseling/discussion   Aspiration pneumonia - present on admission with SOB, copious sputum production, difficulty clearing secretions.  Chest xray consistent with aspiration PNA. 11/6 - fever 8:30 PM last night, but procal is <0.10.   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   11/7 - afebrile, lot of secretions but improving and remain non-purulent 11/8 - now off antibiotics, afebrile. Recurrent fever.   End of shift went into respiratory distress, right lung with mucus plugging, transferred back to stepdown, underwent bronch with Dr. Mortimer Fries.   11/9 - early AM xray okay, but repeated due to recurrent tachypnea and again R mucus plugging and volume loss.    Completed course of Unasyn on 11/7.  --Resumed on empiric Zosyn pending BAL cultures, given fevers and bronchoscopy with mucopurulent secretions --Aspiration precautions --Diligent oral care --SLP evaluation - continues NPO status, see their note for complete details. --RT following --PRN Tylenol if fevers --PRN antitussives --scopolamine  patch to dry up secretions  Hypernatremia - Resolved.  Monitor BMP. -- Continue free water flushes at 400cc q4h & flushes before and after TF's per orders. (TF"s on hold). 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 . --continue Florinef and midodrine  Iron deficiency anemia - Improving, Hbg stable and improving, 7.6. monitor CBC.  Transfuse for Hbg < 7.0.  Continue iron supplement.  Venofer infusion 11/6 and 11/8. No evidence of bleeding.  Low iron on anemia panel despite PO supplement.  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  Follows with Dr. Richardson Landry. --Tube feeds on hold --Routine trach care - Dr. Richardson Landry ENT to see patient as outpatient follow up had been planned.  Patient may require cuffed trach to prevent recurrent aspiration, at expense of ability to speak --Continue baclofen, increased  --Diligent bowel regimen - suppository and manual stimulation at bedtime.  PRN suppository if needed during day. --Prone to autonomic dysreflexia   Sacral decubitus ulcer, stage II  - POA.  WOC following.  Due to quadriplegic status and immobillity.   --Reposition patient at least every 2 hours. --Local wound care Pressure Injury 07/26/20 Sacrum Unstageable - Full thickness tissue loss in which the base of the injury is covered by slough (yellow, tan, gray, green or brown) and/or eschar (tan, brown or black) in the wound bed. (Active)  07/26/20 0513  Location: Sacrum  Location Orientation:  Staging: Unstageable - Full thickness tissue loss in which the base of the injury is covered by slough (yellow, tan, gray, green or brown) and/or eschar (tan, brown or black) in the wound bed.  Wound Description (Comments):   Present on Admission: Yes    Hx of Hypertension - not on  antihypertensives, but on Florinef  Hx of depression - continue Prozac  Right shoulder pain - chronic.  Lidoderm patches.     DVT prophylaxis: enoxaparin (LOVENOX) injection 40 mg Start: 07/26/20 1000 SCDs Start: 07/25/20 2243   Diet:  Diet Orders (From admission, onward)    Start     Ordered   07/25/20 2244  Diet NPO time specified  Diet effective now        07/25/20 2244            Code Status: Full Code    Subjective 08/01/20    Pt seen this AM in stepdown.  Had bronchoscopy yesterday evening with improvement.  Early AM chest xray with good aeration on right, but repeated due to recurrent tachypnea - again showed signs of mucus plugging with significant volume loss.  Pt stated feels okay.  Secretions have been worse.  Currently not feeling short of breath and appears not to be in distress.    Disposition Plan & Communication   Status is: Inpatient  Inpatient remains appropriate status due to severity of illness.  Recurrent mucus plugging and worsening respiratory status.   Dispo: The patient is from: Home              Anticipated d/c is to: Home               Anticipated d/c date is: 1 days              Patient currently is not medically stable for d/c.  Family Communication: wife not present at bedside on rounds today, had just gone home when I returned later.  Will attempt to call.  Has been updated by PCCM.   Consults, Procedures, Significant Events   Consultants:   PCCM  WOC  Palliative care  Procedures:   none  Antimicrobials:  Anti-infectives (From admission, onward)   Start     Dose/Rate Route Frequency Ordered Stop   08/01/20 1100  piperacillin-tazobactam (ZOSYN) IVPB 3.375 g        3.375 g 12.5 mL/hr over 240 Minutes Intravenous Every 8 hours 08/01/20 0915     07/26/20 1600  Ampicillin-Sulbactam (UNASYN) 3 g in sodium chloride 0.9 % 100 mL IVPB        3 g 200 mL/hr over 30 Minutes Intravenous Every 6 hours 07/26/20 1130 07/30/20 2200    07/26/20 1200  vancomycin (VANCOCIN) IVPB 1000 mg/200 mL premix  Status:  Discontinued        1,000 mg 200 mL/hr over 60 Minutes Intravenous Every 12 hours 07/25/20 2304 07/26/20 1106   07/25/20 2315  vancomycin (VANCOREADY) IVPB 2000 mg/400 mL       "Followed by" Linked Group Details   2,000 mg 200 mL/hr over 120 Minutes Intravenous  Once 07/25/20 2301 07/26/20 0205   07/25/20 2315  vancomycin (VANCOREADY) IVPB 500 mg/100 mL       "Followed by" Linked Group Details   500 mg 100 mL/hr over 60 Minutes Intravenous  Once 07/25/20 2301 07/26/20 0323   07/25/20 1500  piperacillin-tazobactam (ZOSYN) IVPB 3.375 g  Status:  Discontinued        3.375 g 12.5 mL/hr over 240  Minutes Intravenous Every 8 hours 07/25/20 1446 07/26/20 1129   07/25/20 1430  Ampicillin-Sulbactam (UNASYN) 3 g in sodium chloride 0.9 % 100 mL IVPB  Status:  Discontinued        3 g 200 mL/hr over 30 Minutes Intravenous  Once 07/25/20 1418 07/25/20 1446        Objective   Vitals:   08/01/20 1900 08/01/20 1932 08/01/20 2000 08/01/20 2100  BP: (!) 112/50  114/62 (!) 96/53  Pulse: 84  74 76  Resp: (!) 24  (!) 22 (!) 26  Temp:   98.3 F (36.8 C)   TempSrc:      SpO2: 94% 96% 98% 100%  Weight:      Height:        Intake/Output Summary (Last 24 hours) at 08/01/2020 2213 Last data filed at 08/01/2020 2022 Gross per 24 hour  Intake 934.81 ml  Output 2600 ml  Net -1665.19 ml   Filed Weights   07/25/20 1300  Weight: 108 kg    Physical Exam:  General exam: awake, alert, no acute distress HEENT: trach collar, visible frothy secretions around trach, [place] on extension arm pt able to use independently  Respiratory system: diffuse coarse rhonchi worse on R, normal respiratory effort, on 10 L/min oxygen by trach collar. Cardiovascular system: normal S1/S2, RRR, no pedal edema.   Gastrointestinal system: soft, NT, PEG tube in place Extremities: No edema, offloading boots in place, SCD's    Labs   Data Reviewed: I  have personally reviewed following labs and imaging studies  CBC: Recent Labs  Lab 07/27/20 0726 07/28/20 0605 07/29/20 0713 07/30/20 0343 07/31/20 0443 08/01/20 0002 08/01/20 0344  WBC 8.5   < > 9.4 8.9 9.9 12.9* 12.5*  NEUTROABS 5.8  --  6.6 5.9  --   --   --   HGB 7.9*   < > 7.0* 7.4* 7.7* 7.5* 7.6*  HCT 25.6*   < > 22.8* 24.3* 25.2* 24.4* 24.4*  MCV 89.2   < > 90.8 89.7 89.7 90.7 90.7  PLT 321   < > 322 315 303 270 253   < > = values in this interval not displayed.   Basic Metabolic Panel: Recent Labs  Lab 07/29/20 0713 07/30/20 0343 07/31/20 0443 08/01/20 0002 08/01/20 0344  NA 144 146* 144 148* 148*  K 4.0 3.8 4.0 3.6 3.4*  CL 108 109 106 108 110  CO2 _0 GLUCOSE 117* 121* 112* 156* 208*  BUN 23 23 25* 28* 27*  CREATININE 0.86 0.86 0.88 0.93 0.95  CALCIUM 9.4 9.4 9.5 9.2 9.3  MG 2.3  --   --  2.5*  --    GFR: Estimated Creatinine Clearance: 101.8 mL/min (by C-G formula based on SCr of 0.95 mg/dL). Liver Function Tests: No results for input(s): AST, ALT, ALKPHOS, BILITOT, PROT, ALBUMIN in the last 168 hours. No results for input(s): LIPASE, AMYLASE in the last 168 hours. No results for input(s): AMMONIA in the last 168 hours. Coagulation Profile: No results for input(s): INR, PROTIME in the last 168 hours. Cardiac Enzymes: No results for input(s): CKTOTAL, CKMB, CKMBINDEX, TROPONINI in the last 168 hours. BNP (last 3 results) No results for input(s): PROBNP in the last 8760 hours. HbA1C: No results for input(s): HGBA1C in the last 72 hours. CBG: Recent Labs  Lab 07/26/20 0421  GLUCAP 170*   Lipid Profile: No results for input(s): CHOL, HDL, LDLCALC, TRIG, CHOLHDL, LDLDIRECT in the last 72 hours.  Thyroid Function Tests: No results for input(s): TSH, T4TOTAL, FREET4, T3FREE, THYROIDAB in the last 72 hours. Anemia Panel: No results for input(s): VITAMINB12, FOLATE, FERRITIN, TIBC, IRON, RETICCTPCT in the last 72 hours. Sepsis Labs: Recent  Labs  Lab 07/26/20 0318 07/29/20 0713  PROCALCITON  --  <0.10  LATICACIDVEN 1.5  --     Recent Results (from the past 240 hour(s))  Respiratory Panel by RT PCR (Flu A&B, Covid) - Nasopharyngeal Swab     Status: None   Collection Time: 07/23/20  3:23 AM   Specimen: Nasopharyngeal Swab  Result Value Ref Range Status   SARS Coronavirus 2 by RT PCR NEGATIVE NEGATIVE Final    Comment: (NOTE) SARS-CoV-2 target nucleic acids are NOT DETECTED.  The SARS-CoV-2 RNA is generally detectable in upper respiratoy specimens during the acute phase of infection. The lowest concentration of SARS-CoV-2 viral copies this assay can detect is 131 copies/mL. A negative result does not preclude SARS-Cov-2 infection and should not be used as the sole basis for treatment or other patient management decisions. A negative result may occur with  improper specimen collection/handling, submission of specimen other than nasopharyngeal swab, presence of viral mutation(s) within the areas targeted by this assay, and inadequate number of viral copies (<131 copies/mL). A negative result must be combined with clinical observations, patient history, and epidemiological information. The expected result is Negative.  Fact Sheet for Patients:  PinkCheek.be  Fact Sheet for Healthcare Providers:  GravelBags.it  This test is no t yet approved or cleared by the [place] FDA and  has been authorized for detection and/or diagnosis of SARS-CoV-2 by FDA under an Emergency Use Authorization (EUA). This EUA will remain  in effect (meaning this test can be used) for the duration of the COVID-19 declaration under Section 564(b)(1) of the Act, 21 U.S.C. section 360bbb-3(b)(1), unless the authorization is terminated or revoked sooner.     Influenza A by PCR NEGATIVE NEGATIVE Final   Influenza B by PCR NEGATIVE NEGATIVE Final    Comment: (NOTE) The Xpert Xpress  SARS-CoV-2/FLU/RSV assay is intended as an aid in  the diagnosis of influenza from Nasopharyngeal swab specimens and  should not be used as a sole basis for treatment. Nasal washings and  aspirates are unacceptable for Xpert Xpress SARS-CoV-2/FLU/RSV  testing.  Fact Sheet for Patients: PinkCheek.be  Fact Sheet for Healthcare Providers: GravelBags.it  This test is not yet approved or cleared by the [place] FDA and  has been authorized for detection and/or diagnosis of SARS-CoV-2 by  FDA under an Emergency Use Authorization (EUA). This EUA will remain  in effect (meaning this test can be used) for the duration of the  Covid-19 declaration under Section 564(b)(1) of the Act, 21  U.S.C. section 360bbb-3(b)(1), unless the authorization is  terminated or revoked. Performed at [facility], [address] [place], [place] [address]   Respiratory Panel by RT PCR (Flu A&B, Covid) - Nasopharyngeal Swab     Status: None   Collection Time: 07/25/20  2:11 PM   Specimen: Nasopharyngeal Swab  Result Value Ref Range Status   SARS Coronavirus 2 by RT PCR NEGATIVE NEGATIVE Final    Comment: (NOTE) SARS-CoV-2 target nucleic acids are NOT DETECTED.  The SARS-CoV-2 RNA is generally detectable in upper respiratoy specimens during the acute phase of infection. The lowest concentration of SARS-CoV-2 viral copies this assay can detect is 131 copies/mL. A negative result does not preclude SARS-Cov-2 infection and should not be used as  the sole basis for treatment or other patient management decisions. A negative result may occur with  improper specimen collection/handling, submission of specimen other than nasopharyngeal swab, presence of viral mutation(s) within the areas targeted by this assay, and inadequate number of viral copies (<131 copies/mL). A negative result must be combined with clinical observations, patient history,  and epidemiological information. The expected result is Negative.  Fact Sheet for Patients:  PinkCheek.be  Fact Sheet for Healthcare Providers:  GravelBags.it  This test is no t yet approved or cleared by the [place] FDA and  has been authorized for detection and/or diagnosis of SARS-CoV-2 by FDA under an Emergency Use Authorization (EUA). This EUA will remain  in effect (meaning this test can be used) for the duration of the COVID-19 declaration under Section 564(b)(1) of the Act, 21 U.S.C. section 360bbb-3(b)(1), unless the authorization is terminated or revoked sooner.     Influenza A by PCR NEGATIVE NEGATIVE Final   Influenza B by PCR NEGATIVE NEGATIVE Final    Comment: (NOTE) The Xpert Xpress SARS-CoV-2/FLU/RSV assay is intended as an aid in  the diagnosis of influenza from Nasopharyngeal swab specimens and  should not be used as a sole basis for treatment. Nasal washings and  aspirates are unacceptable for Xpert Xpress SARS-CoV-2/FLU/RSV  testing.  Fact Sheet for Patients: PinkCheek.be  Fact Sheet for Healthcare Providers: GravelBags.it  This test is not yet approved or cleared by the [place] FDA and  has been authorized for detection and/or diagnosis of SARS-CoV-2 by  FDA under an Emergency Use Authorization (EUA). This EUA will remain  in effect (meaning this test can be used) for the duration of the  Covid-19 declaration under Section 564(b)(1) of the Act, 21  U.S.C. section 360bbb-3(b)(1), unless the authorization is  terminated or revoked. Performed at [facility], [address] [place], [place] [address]   Blood culture (routine x 2)     Status: None (Preliminary result)   Collection Time: 07/25/20  3:34 PM   Specimen: BLOOD  Result Value Ref Range Status   Specimen Description BLOOD BLOOD RIGHT FOREARM  Final   Special  Requests   Final    BOTTLES DRAWN AEROBIC AND ANAEROBIC Blood Culture adequate volume   Culture   Final    NO GROWTH 4 DAYS Performed at [facility], [address], [place], [place] [address]    Report Status PENDING  Incomplete  Blood culture (routine x 2)     Status: None (Preliminary result)   Collection Time: 07/25/20  3:34 PM   Specimen: BLOOD  Result Value Ref Range Status   Specimen Description BLOOD BLOOD LEFT FOREARM  Final   Special Requests   Final    BOTTLES DRAWN AEROBIC AND ANAEROBIC Blood Culture adequate volume   Culture   Final    NO GROWTH 4 DAYS Performed at [facility], [address], [place], [place] [address]    Report Status PENDING  Incomplete  Culture, respiratory (non-expectorated)     Status: None   Collection Time: 07/25/20  6:36 PM   Specimen: Tracheal Aspirate; Respiratory  Result Value Ref Range Status   Specimen Description   Final    TRACHEAL ASPIRATE Performed at [facility], [address] [place], [place] [address]    Special Requests   Final    NONE Performed at [facility], [address] [place], [place] [address]    Gram Stain   Final  MODERATE WBC PRESENT,BOTH PMN AND MONONUCLEAR FEW GRAM POSITIVE COCCI IN CHAINS FEW GRAM POSITIVE RODS RARE GRAM NEGATIVE RODS    Culture   Final    FEW Normal respiratory flora-no Staph aureus or Pseudomonas seen Performed at [facility], [place] [address] [place], [place] [address]    Report Status 07/28/2020 FINAL  Final  MRSA PCR Screening     Status: None   Collection Time: 07/26/20  4:51 AM   Specimen: Nasopharyngeal  Result Value Ref Range Status   MRSA by PCR NEGATIVE NEGATIVE Final    Comment:        The GeneXpert MRSA Assay (FDA approved for NASAL specimens only), is one component of a comprehensive MRSA colonization surveillance program. It is not intended to diagnose MRSA infection nor to guide or monitor treatment for MRSA  infections. Performed at [facility], [address] [place], [place] [address]   Culture, bal-quantitative     Status: None (Preliminary result)   Collection Time: 07/31/20  7:40 PM   Specimen: Bronchoalveolar Lavage; Respiratory  Result Value Ref Range Status   Specimen Description   Final    BRONCHIAL ALVEOLAR LAVAGE Performed at [facility], [address] [place], [place] [address]    Special Requests NONE  Final   Gram Stain   Final    FEW WBC PRESENT, PREDOMINANTLY PMN NO ORGANISMS SEEN    Culture   Final    NO GROWTH < 12 HOURS Performed at [facility] [address], [place], [place] [address]    Report Status PENDING  Incomplete      Imaging Studies   DG Chest Port 1 View  Result Date: 08/01/2020 CLINICAL DATA:  Aspiration pneumonia.  Hypoxia and tachypnea. EXAM: PORTABLE CHEST 1 VIEW COMPARISON:  08/01/2020 FINDINGS: Progressive collapse on the right with increased midline shift to the right. Small right effusion. Left lung remains clear.  Tracheostomy remains in good position. IMPRESSION: Progressive airspace disease and volume loss on the right compatible with partial collapse of the right lung. Right bronchial cut off suggesting mucous plugging Electronically Signed   By: Franchot Gallo M.D.   On: 08/01/2020 08:34   DG Chest Port 1 View  Result Date: 08/01/2020 CLINICAL DATA:  Acute respiratory failure with hypoxia EXAM: PORTABLE CHEST 1 VIEW COMPARISON:  Yesterday FINDINGS: Streaky and hazy opacity on the right with volume loss. There was a chest CT at outside hospital 05/09/2020 with no mention of hilar or pulmonary mass. Relatively clear left lung. Cardiac enlargement. No visible effusion or pneumothorax. Tracheostomy tube in place. IMPRESSION: Unchanged right pulmonary opacification with volume loss. Electronically Signed   By: Monte Fantasia M.D.   On: 08/01/2020 07:55   DG Chest Port 1 View  Result Date: 07/31/2020 CLINICAL  DATA:  Respiratory failure. EXAM: PORTABLE CHEST 1 VIEW COMPARISON:  07/31/2020 FINDINGS: At 1949 hours. The cardio pericardial silhouette is enlarged. Low lung volumes. Interval decrease in right lung collapse/consolidative opacity. Tracheostomy tube again noted. Telemetry leads overlie the chest. IMPRESSION: 1. Interval decrease in right lung collapse/consolidative opacity. 2. Low lung volumes. Electronically Signed   By: Misty Stanley M.D.   On: 07/31/2020 20:13   DG Chest Port 1 View  Result Date: 07/31/2020 CLINICAL DATA:  Worsening shortness of breath today. History of hypertension. EXAM: PORTABLE CHEST 1 VIEW COMPARISON:  07/25/2020 FINDINGS: Tracheostomy in place. Left lung remains largely clear. New development of infiltrate, volume loss and consolidation in the mid  and lower lung on the right. IMPRESSION: New infiltrate, volume loss and consolidation in the mid and lower lung on the right. Electronically Signed   By: Nelson Chimes M.D.   On: 07/31/2020 17:14     Medications   Scheduled Meds: . ascorbic acid  500 mg Per Tube BID  . baclofen  10 mg Per Tube TID  . bisacodyl  10 mg Rectal QHS  . chlorhexidine  15 mL Mouth Rinse BID  . Chlorhexidine Gluconate Cloth  6 each Topical Daily  . collagenase   Topical Daily  . enoxaparin (LOVENOX) injection  40 mg Subcutaneous Q24H  . famotidine  20 mg Per Tube Daily  . feeding supplement (OSMOLITE 1.5 CAL)  237 mL Per Tube Q3H  . feeding supplement (PROSource TF)  45 mL Per Tube BID  . ferrous sulfate  75 mg Per Tube Daily  . fludrocortisone  0.2 mg Per Tube Daily  . FLUoxetine  40 mg Per Tube Daily  . free water  400 mL Per Tube Q4H  . guaiFENesin  20 mL Per Tube Q6H  . lidocaine  1 patch Transdermal Q24H  . lidocaine  15 mL Mouth/Throat Once  . mouth rinse  15 mL Mouth Rinse q12n4p  . midodrine  10 mg Per Tube TID WC  . multivitamin  5 mL Per Tube Daily  . nutrition supplement (JUVEN)  1 packet Per Tube BID BM  . polycarbophil  625  mg Per Tube Daily  . saccharomyces boulardii  250 mg Per Tube BID  . scopolamine  1 patch Transdermal Q72H  . Simethicone  80 mg Per Tube QID  . traZODone  50 mg Per Tube QHS   Continuous Infusions: . sodium chloride Stopped (07/25/20 2347)  . dextrose 5 % with KCl 20 mEq / L 75 mL/hr at 08/01/20 2000  . piperacillin-tazobactam (ZOSYN)  IV 3.375 g (08/01/20 2127)       LOS: 7 days    Time spent: 40 minutes with > 50% spent in coordination of care and direct patient contact.    Ezekiel Slocumb, DO [facility]  08/01/2020, 10:13 PM    If 7PM-7AM, please contact night-coverage. How to contact the [facility] Attending or Consulting provider [place] or covering provider during after hours [place], for this patient?    1. Check the care team in [facility] and look for a) attending/consulting [facility] provider listed and b) the [facility] team listed 2. Log into [email or web address] and use [place]'s universal password to access. If you do not have the password, please contact the hospital operator. 3. Locate the [facility] provider you are looking for under [facility] and page to a number that you can be directly reached. 4. If you still have difficulty reaching the provider, please page the [facility] (Director on Call) for the Hospitalists listed on amion for assistance.

## 2020-08-01 NOTE — Progress Notes (Addendum)
CRITICAL CARE NOTE  CC  follow up respiratory failure  SUBJECTIVE Patient remains critically ill Prognosis is guarded  SIGNIFICANT EVENTS  11/2> Admitted to stepdown unit under hospitalist service with aspiration pneumonia and hypotension.  11/3>Transferred to the floor 11/8>Transferred back to stepdown.  PCCM consulted for possible mucus plugging of RLL. 11/8> s/p bedside bronchoscopy 11/9> Mucus plugging again noted on chest xray. Planning on postural drainange   STUDIES:  11/8> Chest xray New infiltrate, volume loss and consolidation in the mid and lower lung on the right. 11/9> Repeat Chest xrayProgressive airspace disease and volume loss on the right compatible with partial collapse of the right lung. Right bronchial cut off suggesting mucous plugging   MICROBIOLOGY: 11/2 BCx: No growth 11/2 UCx:  11/2 Sputum: Gram positive cocci and Gram negative rods 11/2 MRSA PCR: Negative    ANTIBIOTICS: 11/2> Unasyn > completed 11/7  REVIEW OF SYSTEMS  PATIENT IS UNABLE TO PROVIDE COMPLETE REVIEW OF SYSTEMS DUE TO SEVERE CRITICAL ILLNESS   BP (!) 103/56   Pulse 93   Temp 98.7 F (37.1 C) (Axillary)   Resp (!) 30   Ht _0  (1.956 m)   Wt 108 kg   SpO2 90%   BMI 28.23 kg/m   I/O last 3 completed shifts: In: 400 [NG/GT:400] Out: 3150 [Urine:3150] Total I/O In: -  Out: 400 [Urine:400]  SpO2: 90 % O2 Flow Rate (L/min): 10 L/min FiO2 (%): 35 %  Estimated body mass index is 28.23 kg/m as calculated from the following:  Height as of this encounter: _1  (1.956 m).  Weight as of this encounter: 108 kg.  Pressure Injury 07/26/20 Sacrum Unstageable - Full thickness tissue loss in which the base of the injury is covered by slough (yellow, tan, gray, green or brown) and/or eschar (tan, brown or black) in the wound bed. (Active)  07/26/20 0513  Location: Sacrum  Location Orientation:   Staging: Unstageable - Full thickness tissue loss in which the base of the injury  is covered by slough (yellow, tan, gray, green or brown) and/or eschar (tan, brown or black) in the wound bed.  Wound Description (Comments):   Present on Admission: Yes    PHYSICAL EXAMINATION: GENERAL:  69 y.o.-year-old patient lying in the bed with no acute distress. Sitting up in bed. Trach capped EYES: Pupils equal, round, reactive to light and accommodation. No scleral icterus. Extraocular muscles intact.  HEENT: Head atraumatic, normocephalic. Oropharynx and nasopharynx clear.  NECK:  Supple, no jugular venous distention. No thyroid enlargement, no tenderness. + Capped Trach LUNGS: Normal breath sounds bilaterally, no wheezing, Positive for  rales,rhonchi or crepitation. No use of accessory muscles of respiration.  CARDIOVASCULAR: S1, S2 normal. No murmurs, rubs, or gallops.  ABDOMEN: Soft, nontender, nondistended. Bowel sounds present. No organomegaly or mass.  EXTREMITIES: Generalized edema. Dependent edema R>L, cyanosis, or clubbing.  NEUROLOGIC: Cranial nerved grossly intact except as noted otherwise.. Sensation diminished in all extremities.. Gait not checked. Dysphonic speech, B/l UE shoulder abduction 1+/5, distally 0/5 with ?trace flicker left wrist flexor. B/l LE: 0/5 proximal to distal PSYCHIATRIC: The patient is alert and oriented x 3.  SKIN: Sacral ulcer present per wound care. Not assessed.  MEDICATIONS: I have reviewed all medications and confirmed regimen as documented Scheduled Meds: . ascorbic acid  500 mg Per Tube BID  . baclofen  10 mg Per Tube TID  . bisacodyl  10 mg Rectal QHS  . chlorhexidine  15 mL Mouth Rinse BID  . Chlorhexidine  Gluconate Cloth  6 each Topical Daily  . collagenase   Topical Daily  . enoxaparin (LOVENOX) injection  40 mg Subcutaneous Q24H  . famotidine  20 mg Per Tube Daily  . feeding supplement (OSMOLITE 1.5 CAL)  237 mL Per Tube Q3H  . feeding supplement (PROSource TF)  45 mL Per Tube BID  . ferrous sulfate  75 mg Per Tube Daily  .  fludrocortisone  0.2 mg Per Tube Daily  . FLUoxetine  40 mg Per Tube Daily  . free water  400 mL Per Tube Q4H  . guaiFENesin  20 mL Per Tube Q6H  . lidocaine  1 patch Transdermal Q24H  . lidocaine  15 mL Mouth/Throat Once  . mouth rinse  15 mL Mouth Rinse q12n4p  . midodrine  10 mg Per Tube TID WC  . multivitamin  5 mL Per Tube Daily  . nutrition supplement (JUVEN)  1 packet Per Tube BID BM  . polycarbophil  625 mg Per Tube Daily  . saccharomyces boulardii  250 mg Per Tube BID  . scopolamine  1 patch Transdermal Q72H  . Simethicone  80 mg Per Tube QID  . traZODone  50 mg Per Tube QHS   Continuous Infusions: . sodium chloride Stopped (07/25/20 2347)  . dextrose 5 % with KCl 20 mEq / L 20 mEq (08/01/20 0918)  . piperacillin-tazobactam (ZOSYN)  IV     PRN Meds:.acetaminophen, bisacodyl, hydrocortisone cream  CULTURE RESULTS   Recent Results (from the past 240 hour(s))  Respiratory Panel by RT PCR (Flu A&B, Covid) - Nasopharyngeal Swab     Status: None   Collection Time: 07/23/20  3:23 AM   Specimen: Nasopharyngeal Swab  Result Value Ref Range Status   SARS Coronavirus 2 by RT PCR NEGATIVE NEGATIVE Final    Comment: (NOTE) SARS-CoV-2 target nucleic acids are NOT DETECTED.  The SARS-CoV-2 RNA is generally detectable in upper respiratoy specimens during the acute phase of infection. The lowest concentration of SARS-CoV-2 viral copies this assay can detect is 131 copies/mL. A negative result does not preclude SARS-Cov-2 infection and should not be used as the sole basis for treatment or other patient management decisions. A negative result may occur with  improper specimen collection/handling, submission of specimen other than nasopharyngeal swab, presence of viral mutation(s) within the areas targeted by this assay, and inadequate number of viral copies (<131 copies/mL). A negative result must be combined with clinical observations, patient history, and epidemiological  information. The expected result is Negative.  Fact Sheet for Patients:  PinkCheek.be  Fact Sheet for Healthcare Providers:  GravelBags.it  This test is no t yet approved or cleared by the [place] FDA and  has been authorized for detection and/or diagnosis of SARS-CoV-2 by FDA under an Emergency Use Authorization (EUA). This EUA will remain  in effect (meaning this test can be used) for the duration of the COVID-19 declaration under Section 564(b)(1) of the Act, 21 U.S.C. section 360bbb-3(b)(1), unless the authorization is terminated or revoked sooner.     Influenza A by PCR NEGATIVE NEGATIVE Final   Influenza B by PCR NEGATIVE NEGATIVE Final    Comment: (NOTE) The Xpert Xpress SARS-CoV-2/FLU/RSV assay is intended as an aid in  the diagnosis of influenza from Nasopharyngeal swab specimens and  should not be used as a sole basis for treatment. Nasal washings and  aspirates are unacceptable for Xpert Xpress SARS-CoV-2/FLU/RSV  testing.  Fact Sheet for Patients: PinkCheek.be  Fact Sheet for Healthcare Providers:  GravelBags.it  This test is not yet approved or cleared by the [place] and  has been authorized for detection and/or diagnosis of SARS-CoV-2 by  FDA under an Emergency Use Authorization (EUA). This EUA will remain  in effect (meaning this test can be used) for the duration of the  Covid-19 declaration under Section 564(b)(1) of the Act, 21  U.S.C. section 360bbb-3(b)(1), unless the authorization is  terminated or revoked. Performed at [facility], [address] [place], [place] [address]   Respiratory Panel by RT PCR (Flu A&B, Covid) - Nasopharyngeal Swab     Status: None   Collection Time: 07/25/20  2:11 PM   Specimen: Nasopharyngeal Swab  Result Value Ref Range Status   SARS Coronavirus 2 by RT PCR NEGATIVE NEGATIVE Final     Comment: (NOTE) SARS-CoV-2 target nucleic acids are NOT DETECTED.  The SARS-CoV-2 RNA is generally detectable in upper respiratoy specimens during the acute phase of infection. The lowest concentration of SARS-CoV-2 viral copies this assay can detect is 131 copies/mL. A negative result does not preclude SARS-Cov-2 infection and should not be used as the sole basis for treatment or other patient management decisions. A negative result may occur with  improper specimen collection/handling, submission of specimen other than nasopharyngeal swab, presence of viral mutation(s) within the areas targeted by this assay, and inadequate number of viral copies (<131 copies/mL). A negative result must be combined with clinical observations, patient history, and epidemiological information. The expected result is Negative.  Fact Sheet for Patients:  PinkCheek.be  Fact Sheet for Healthcare Providers:  GravelBags.it  This test is no t yet approved or cleared by the [place] FDA and  has been authorized for detection and/or diagnosis of SARS-CoV-2 by FDA under an Emergency Use Authorization (EUA). This EUA will remain  in effect (meaning this test can be used) for the duration of the COVID-19 declaration under Section 564(b)(1) of the Act, 21 U.S.C. section 360bbb-3(b)(1), unless the authorization is terminated or revoked sooner.     Influenza A by PCR NEGATIVE NEGATIVE Final   Influenza B by PCR NEGATIVE NEGATIVE Final    Comment: (NOTE) The Xpert Xpress SARS-CoV-2/FLU/RSV assay is intended as an aid in  the diagnosis of influenza from Nasopharyngeal swab specimens and  should not be used as a sole basis for treatment. Nasal washings and  aspirates are unacceptable for Xpert Xpress SARS-CoV-2/FLU/RSV  testing.  Fact Sheet for Patients: PinkCheek.be  Fact Sheet for Healthcare  Providers: GravelBags.it  This test is not yet approved or cleared by the [place] FDA and  has been authorized for detection and/or diagnosis of SARS-CoV-2 by  FDA under an Emergency Use Authorization (EUA). This EUA will remain  in effect (meaning this test can be used) for the duration of the  Covid-19 declaration under Section 564(b)(1) of the Act, 21  U.S.C. section 360bbb-3(b)(1), unless the authorization is  terminated or revoked. Performed at [facility], [address] [place], [place] [address]   Blood culture (routine x 2)     Status: None (Preliminary result)   Collection Time: 07/25/20  3:34 PM   Specimen: BLOOD  Result Value Ref Range Status   Specimen Description BLOOD BLOOD RIGHT FOREARM  Final   Special Requests   Final    BOTTLES DRAWN AEROBIC AND ANAEROBIC Blood Culture adequate volume   Culture   Final    NO GROWTH 4 DAYS Performed at [facility], [address],  [place], [place] [address]    Report Status PENDING  Incomplete  Blood culture (routine x 2)     Status: None (Preliminary result)   Collection Time: 07/25/20  3:34 PM   Specimen: BLOOD  Result Value Ref Range Status   Specimen Description BLOOD BLOOD LEFT FOREARM  Final   Special Requests   Final    BOTTLES DRAWN AEROBIC AND ANAEROBIC Blood Culture adequate volume   Culture   Final    NO GROWTH 4 DAYS Performed at [facility], [address] [place], [place] [address]    Report Status PENDING  Incomplete  Culture, respiratory (non-expectorated)     Status: None   Collection Time: 07/25/20  6:36 PM   Specimen: Tracheal Aspirate; Respiratory  Result Value Ref Range Status   Specimen Description   Final    TRACHEAL ASPIRATE Performed at [facility], [address] [place], [place] [address]    Special Requests   Final    NONE Performed at [facility], [address] [place], [place] [address]    Gram  Stain   Final    MODERATE WBC PRESENT,BOTH PMN AND MONONUCLEAR FEW GRAM POSITIVE COCCI IN CHAINS FEW GRAM POSITIVE RODS RARE GRAM NEGATIVE RODS    Culture   Final    FEW Normal respiratory flora-no Staph aureus or Pseudomonas seen Performed at [facility], [place] [address], [place], [place] [address]    Report Status 07/28/2020 FINAL  Final  MRSA PCR Screening     Status: None   Collection Time: 07/26/20  4:51 AM   Specimen: Nasopharyngeal  Result Value Ref Range Status   MRSA by PCR NEGATIVE NEGATIVE Final    Comment:        The GeneXpert MRSA Assay (FDA approved for NASAL specimens only), is one component of a comprehensive MRSA colonization surveillance program. It is not intended to diagnose MRSA infection nor to guide or monitor treatment for MRSA infections. Performed at [facility], [address] [place], [place] [address]   Culture, bal-quantitative     Status: None (Preliminary result)   Collection Time: 07/31/20  7:40 PM   Specimen: Bronchoalveolar Lavage; Respiratory  Result Value Ref Range Status   Specimen Description   Final    BRONCHIAL ALVEOLAR LAVAGE Performed at [facility], [address] [place], [place] [address]    Special Requests NONE  Final   Gram Stain   Final    FEW WBC PRESENT, PREDOMINANTLY PMN NO ORGANISMS SEEN Performed at [facility], [place] [address], [place], [place] [address]    Culture PENDING  Incomplete   Report Status PENDING  Incomplete    CBC    Component Value Date/Time   WBC 12.5 (H) 08/01/2020 0344   RBC 2.69 (L) 08/01/2020 0344   HGB 7.6 (L) 08/01/2020 0344   HGB 14.7 02/01/2020 0927   HCT 24.4 (L) 08/01/2020 0344   HCT 44.1 02/01/2020 0927   PLT 253 08/01/2020 0344   PLT 267 02/01/2020 0927   MCV 90.7 08/01/2020 0344   MCV 96 02/01/2020 0927   MCH 28.3 08/01/2020 0344   MCHC 31.1 08/01/2020 0344   RDW 17.1 (H) 08/01/2020 0344   RDW 12.3 02/01/2020 0927   LYMPHSABS 1.4  07/30/2020 0343   LYMPHSABS 1.5 02/01/2020 0927   MONOABS 1.3 (H) 07/30/2020 0343   EOSABS 0.3 07/30/2020 0343   EOSABS 0.1 02/01/2020 0927   BASOSABS 0.0 07/30/2020 0343  BASOSABS 0.0 02/01/2020 0927   CMP Latest Ref Rng & Units 08/01/2020 08/01/2020 07/31/2020  Glucose 70 - 99 mg/dL 208(H) 156(H) 112(H)  BUN 8 - 23 mg/dL 27(H) 28(H) 25(H)  Creatinine 0.61 - 1.24 mg/dL 0.95 0.93 0.88  Sodium 135 - 145 mmol/L 148(H) 148(H) 144  Potassium 3.5 - 5.1 mmol/L 3.4(L) 3.6 4.0  Chloride 98 - 111 mmol/L 110 108 106  CO2 22 - 32 mmol/L _0 Calcium 8.9 - 10.3 mg/dL 9.3 9.2 9.5  Total Protein 6.5 - 8.1 g/dL - - -  Total Bilirubin 0.3 - 1.2 mg/dL - - -  Alkaline Phos 38 - 126 U/L - - -  AST 15 - 41 U/L - - -  ALT 0 - 44 U/L - - -      IMAGING    DG Chest Port 1 View  Result Date: 08/01/2020 CLINICAL DATA:  Aspiration pneumonia.  Hypoxia and tachypnea. EXAM: PORTABLE CHEST 1 VIEW COMPARISON:  08/01/2020 FINDINGS: Progressive collapse on the right with increased midline shift to the right. Small right effusion. Left lung remains clear.  Tracheostomy remains in good position. IMPRESSION: Progressive airspace disease and volume loss on the right compatible with partial collapse of the right lung. Right bronchial cut off suggesting mucous plugging Electronically Signed   By: Franchot Gallo M.D.   On: 08/01/2020 08:34   DG Chest Port 1 View  Result Date: 08/01/2020 CLINICAL DATA:  Acute respiratory failure with hypoxia EXAM: PORTABLE CHEST 1 VIEW COMPARISON:  Yesterday FINDINGS: Streaky and hazy opacity on the right with volume loss. There was a chest CT at outside hospital 05/09/2020 with no mention of hilar or pulmonary mass. Relatively clear left lung. Cardiac enlargement. No visible effusion or pneumothorax. Tracheostomy tube in place. IMPRESSION: Unchanged right pulmonary opacification with volume loss. Electronically Signed   By: Monte Fantasia M.D.   On: 08/01/2020 07:55   DG Chest Port  1 View  Result Date: 07/31/2020 CLINICAL DATA:  Respiratory failure. EXAM: PORTABLE CHEST 1 VIEW COMPARISON:  07/31/2020 FINDINGS: At 1949 hours. The cardio pericardial silhouette is enlarged. Low lung volumes. Interval decrease in right lung collapse/consolidative opacity. Tracheostomy tube again noted. Telemetry leads overlie the chest. IMPRESSION: 1. Interval decrease in right lung collapse/consolidative opacity. 2. Low lung volumes. Electronically Signed   By: Misty Stanley M.D.   On: 07/31/2020 20:13   DG Chest Port 1 View  Result Date: 07/31/2020 CLINICAL DATA:  Worsening shortness of breath today. History of hypertension. EXAM: PORTABLE CHEST 1 VIEW COMPARISON:  07/25/2020 FINDINGS: Tracheostomy in place. Left lung remains largely clear. New development of infiltrate, volume loss and consolidation in the mid and lower lung on the right. IMPRESSION: New infiltrate, volume loss and consolidation in the mid and lower lung on the right. Electronically Signed   By: Nelson Chimes M.D.   On: 07/31/2020 17:14     Nutrition Status: Nutrition Problem: Inadequate oral intake Etiology: inability to eat Signs/Symptoms: NPO status (reliance on tube feeds and free water per G-tube to meet calorie/protein/hydration needs.) Interventions: Tube feeding, Prostat, MVI    Indwelling Urinary Catheter continued, requirement due to   Reason to continue Indwelling Urinary Catheter strict Intake/Output monitoring for hemodynamic instability   Central Line/ continued, requirement due to  Reason to continue Ernest of central venous pressure or other hemodynamic parameters and poor IV access   .SpO2: 90 % O2 Flow Rate (L/min): 10 L/min FiO2 (%): 35 %   ASSESSMENT /  PLAN:  69 y.o male with significant PMH of HTN, acute on chronic hypoxic respiratory failure, AKI due to hypovolemia, C5 vertebral body fracture, C3-C6 cord compression with edema s/p C3-C6 laminectomy with fusion and repair of  dural tear  (04/26/20), spinal shock, post procedural PTX,r ecurrent aspiration pneumonia with mucus plugging s/p PEG/TRACH 05/04/20, and PEA Cardiac arrest with ROSC admitted on 07/25/20 with aspiration pneumonia. Now with worsening hypoxia, dyspnea and tachypnea on exam. Repeat chest Xray concerning for mucus plugging s/p bronchoscopy.    Acute on Chronic Hypoxic Respiratory Failure secondary to recurrent aspiration pneumonia, possible mucus plug on RLL S/p trach placement 05/04/20 S/p Bronchoscopy 11/8. Now with recurrent plugging again noted on this am chest xray -Supplemental O2 as needed to maintain O2 saturations 88 to 92% via trach collar -Follow intermittent ABG and chest x-ray as needed -As needed bronchodilators -Aggressive pulmonary hygiene -suctioning PRN with trach care -Aspiration precautions -Plan postural drainage   Aspiration pneumonia Completed 7 day course of treatment with Unasyn. Spike a temp on 11/6 while on abx -Monitor fever curve -Monitor white count -Follow cultures   Episodic neurogenic hypotension Likely 2/2 spinal cord injury. Appears hemodynamically stable -Continue Midodrine and Florinef as scheduled -SCDs for venous return   Dysphagia s/s SCI S/p PEG placement 8/4 -Hold TF due to aspiration -Aspiration precautions -IVFs hydration while NPO     Iron Deficiency Anemia with baseline hgb 8.0 S/p Venofer infusion 11/6 -Continue iron supplement -Transfuse for hgb<7.0    Transaminitis - resolved Patient noted to have mild elevated LFTs, appears to have been elevated prior. 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  [facility]  Medical Director [place] Director [place] Department

## 2020-08-01 NOTE — Progress Notes (Addendum)
   08/01/20 1000  Clinical Encounter Type  Visited With Family  Visit Type Initial;Spiritual support  Referral From Chaplain  Consult/Referral To Chaplain  While entering ICU, chaplain notice a lady sitting in the waiting room. Chaplain stopped and started talking to the lady and noticed tears in her eyes. Chaplain said, tell me about your tears. [name] said "I was talking to the [place]." As the conversation went on, [name], Ryan Day blamed herself for not doing more to help her husband. Chaplain asked "who brought him to the hospital?" And [name] said "EMS." Chaplain said "who called them?" [name] said "I did." Chaplain explained that she knew to call EMS rather than keep her husband home. The more they talked the more [name] would say, there seem to be more that I should be doing.[name] explained Pt feel on August 4th, broke his neck leaving him a paraplegic. I know I can't take his place, but I should be doing more. Ryan Day said the one thing that keeps her going is something her husband said to he and that was "God's got [place]." They talked for over 30 minutes, but by the time they finish talking, [name], attitude had changed. Her facial expression was different. Chaplain asked if she could pray with her and she said yes. Chaplain prayed, they hugged and chaplain left.

## 2020-08-01 NOTE — Progress Notes (Signed)
End of shift summary: Pt remains on 10L trach collar. Pt has had copious amount of white frothy secretions today from trach and orally. Pt has been afebrile but temp is increasing at end of shift. Pt has required very frequent suctioning d/t secretions. C-collar placed on pt to transfer him to ICU bed and c-collar removed at this time. VSS throughout shift. Pt has BM today and was cleaned and bathed. Wife has visited pt throughout day.

## 2020-08-01 NOTE — Progress Notes (Signed)
[place] visited w/pt. and wife at bedside in ICU; [facility] had met w/pt.'s wife on Saturday; she shares today that pt. had to undergo emergency surgery last night and was transferred to ICU; pt. awake and seemed alert but had difficulty speaking; wife requested prayer for strength and recovery for pt.; CH prayed for both pt. and wife for sense of divine accompaniment and presence during this difficult time; wife tearful and grateful for [facility] visit.  CH remains available as needed.

## 2020-08-01 NOTE — Progress Notes (Signed)
Tube feeds held per Dr. Mortimer Fries at this time.

## 2020-08-02 ENCOUNTER — Inpatient Hospital Stay: Payer: BC Managed Care – PPO

## 2020-08-02 DIAGNOSIS — D500 Iron deficiency anemia secondary to blood loss (chronic): Secondary | ICD-10-CM

## 2020-08-02 DIAGNOSIS — J9621 Acute and chronic respiratory failure with hypoxia: Secondary | ICD-10-CM

## 2020-08-02 DIAGNOSIS — Z930 Tracheostomy status: Secondary | ICD-10-CM

## 2020-08-02 LAB — BASIC METABOLIC PANEL
Anion gap: 10 (ref 5–15)
BUN: 27 mg/dL — ABNORMAL HIGH (ref 8–23)
CO2: 27 mmol/L (ref 22–32)
Calcium: 9.5 mg/dL (ref 8.9–10.3)
Chloride: 109 mmol/L (ref 98–111)
Creatinine, Ser: 0.7 mg/dL (ref 0.61–1.24)
GFR, Estimated: >60 mL/min (ref >60)
Glucose, Bld: 174 mg/dL — ABNORMAL HIGH (ref 70–99)
Potassium: 4 mmol/L (ref 3.5–5.1)
Sodium: 146 mmol/L — ABNORMAL HIGH (ref 135–145)

## 2020-08-02 LAB — CBC WITH DIFFERENTIAL/PLATELET
Abs Immature Granulocytes: 0.07 10*3/uL (ref 0.00–0.07)
Basophils Absolute: 0 10*3/uL (ref 0.0–0.1)
Basophils Relative: 0 %
Eosinophils Absolute: 0 10*3/uL (ref 0.0–0.5)
Eosinophils Relative: 0 %
HCT: 27.7 % — ABNORMAL LOW (ref 39.0–52.0)
Hemoglobin: 8.4 g/dL — ABNORMAL LOW (ref 13.0–17.0)
Immature Granulocytes: 1 %
Lymphocytes Relative: 6 %
Lymphs Abs: 0.9 10*3/uL (ref 0.7–4.0)
MCH: 27.4 pg (ref 26.0–34.0)
MCHC: 30.3 g/dL (ref 30.0–36.0)
MCV: 90.2 fL (ref 80.0–100.0)
Monocytes Absolute: 0.5 10*3/uL (ref 0.1–1.0)
Monocytes Relative: 3 %
Neutro Abs: 12.5 10*3/uL — ABNORMAL HIGH (ref 1.7–7.7)
Neutrophils Relative %: 90 %
Platelets: 256 10*3/uL (ref 150–400)
RBC: 3.07 MIL/uL — ABNORMAL LOW (ref 4.22–5.81)
RDW: 16.9 % — ABNORMAL HIGH (ref 11.5–15.5)
WBC: 13.9 10*3/uL — ABNORMAL HIGH (ref 4.0–10.5)
nRBC: 0 % (ref 0.0–0.2)

## 2020-08-02 LAB — GLUCOSE, CAPILLARY: Glucose-Capillary: 129 mg/dL — ABNORMAL HIGH (ref 70–99)

## 2020-08-02 LAB — MAGNESIUM: Magnesium: 2.5 mg/dL — ABNORMAL HIGH (ref 1.7–2.4)

## 2020-08-02 LAB — PHOSPHORUS: Phosphorus: 3.6 mg/dL (ref 2.5–4.6)

## 2020-08-02 LAB — PROCALCITONIN: Procalcitonin: 0.39 ng/mL

## 2020-08-02 IMAGING — DX DG CHEST 1V PORT
1 series · 1 of 1 positions shown · non-contrast
Comparison: Yesterday

CLINICAL DATA: Aspiration pneumonia

EXAM:
PORTABLE CHEST 1 VIEW

[chest ap]
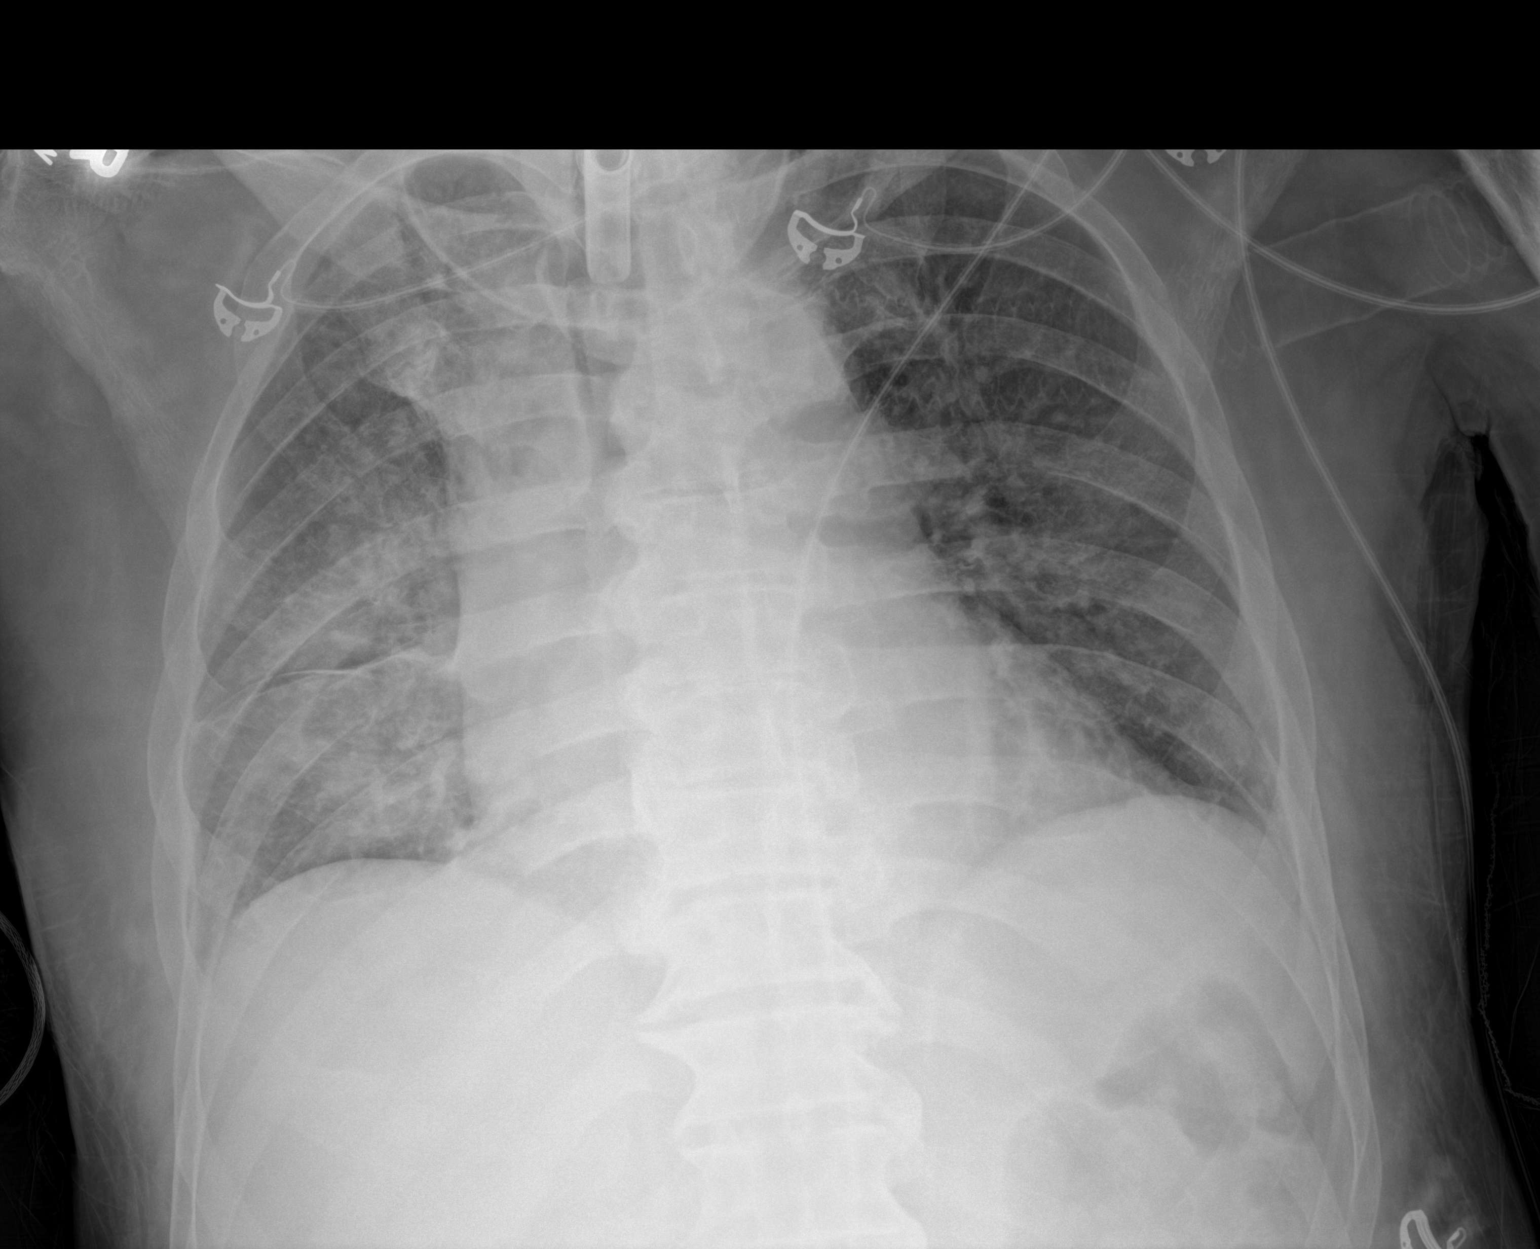

[1 of 1 positions shown; findings below may reference images not displayed]

FINDINGS: Improved inflation of the right lung. There is still diffuse hazy
and interstitial opacity. Tracheostomy tube in place. Stable heart
size and mediastinal contours. Bulky endplate spurring
IMPRESSION: 1. Improved inflation of the right lung.
2. Diffuse interstitial and airspace opacity, question superimposed
edema.

## 2020-08-02 IMAGING — RF DG UGI W SINGLE CM
8 series · 8 of 8 positions shown · non-contrast
Comparison: None.

CLINICAL DATA: Evaluate for gastroesophageal reflux. Aspiration
pneumonia.

EXAM:
UPPER GI SERIES WITHOUT KUB
TECHNIQUE: Routine upper GI series was performed with thin and thick barium.
FLUOROSCOPY TIME:  Fluoroscopy Time:  0.8 minute
Radiation Exposure Index (if provided by the fluoroscopic device):
8.9 mGy
Number of Acquired Spot Images: 0

[Series 1: cp_standard · 0.26mm/px · 1 of 1 slices shown (1 of 8)]
[im 1/1]
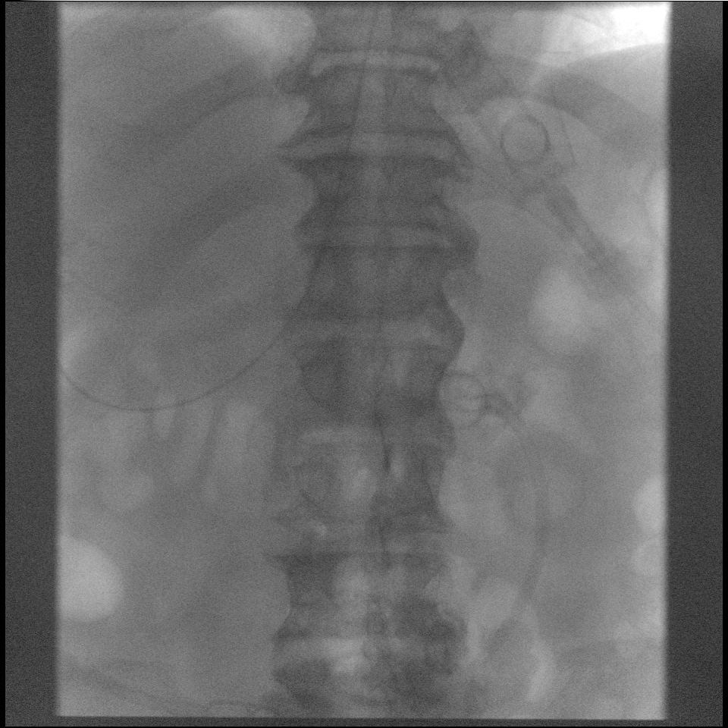

[Series 2: cp_standard · 0.26mm/px · 1 of 1 slices shown (2 of 8)]
[im 1/1]
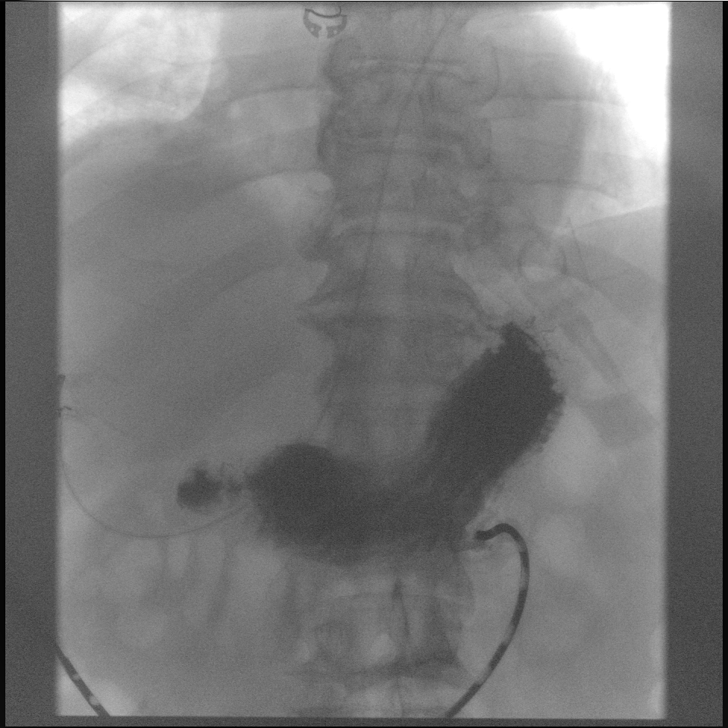

[Series 3: cp_standard · 0.26mm/px · 1 of 1 slices shown (3 of 8)]
[im 1/1]
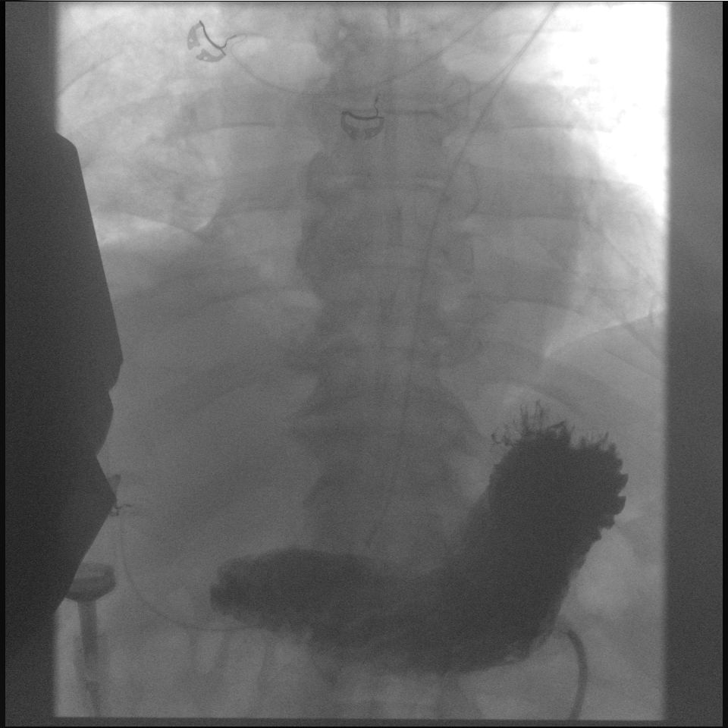

[Series 4: cp_standard · 0.26mm/px · 1 of 1 slices shown (4 of 8)]
[im 1/1]
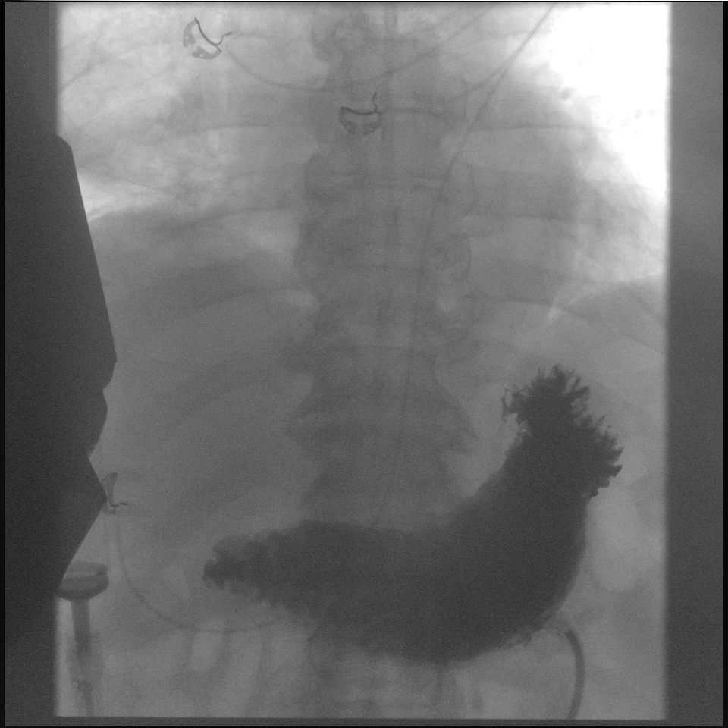

[Series 5: cp_standard · 0.26mm/px · 1 of 1 slices shown (5 of 8)]
[im 1/1]
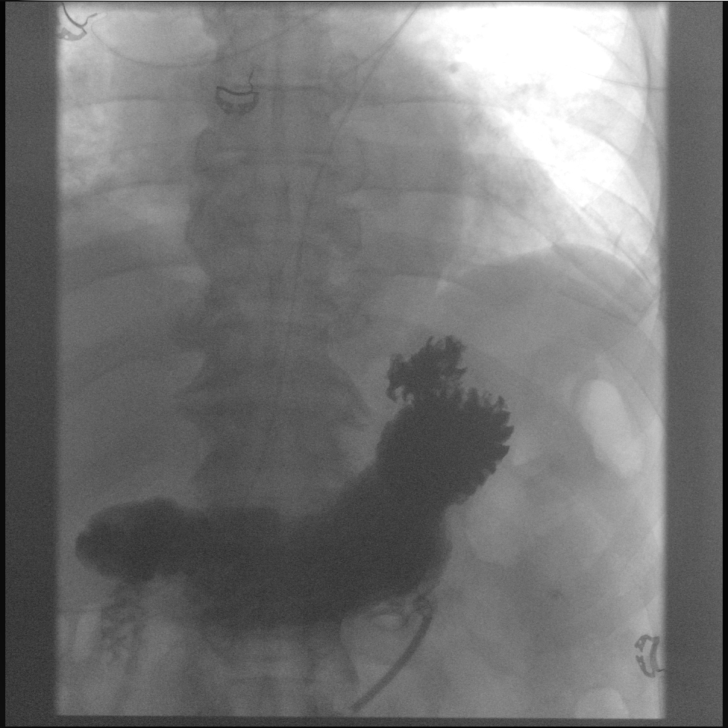

[Series 6: cp_standard · 0.26mm/px · 1 of 1 slices shown (6 of 8)]
[im 1/1]
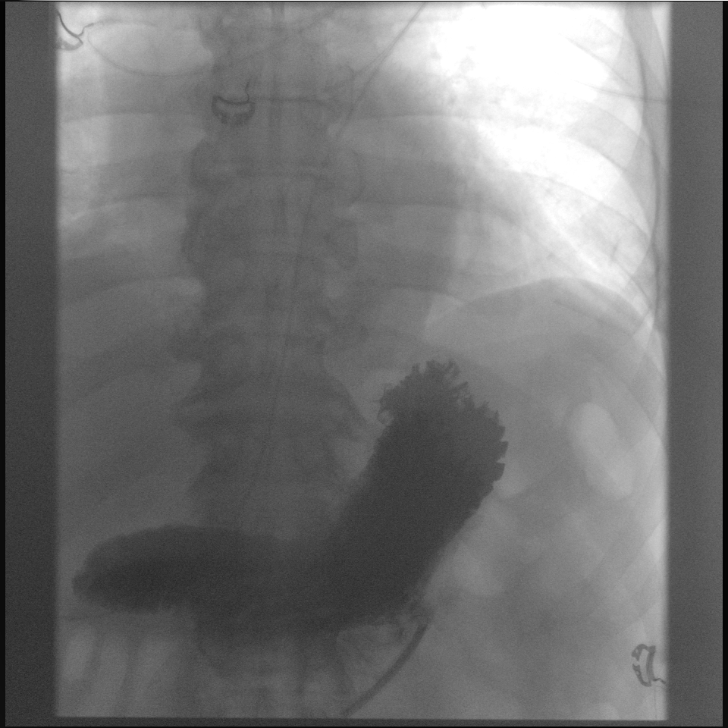

[Series 7: cp_standard · 0.26mm/px · 1 of 1 slices shown (7 of 8)]
[im 1/1]
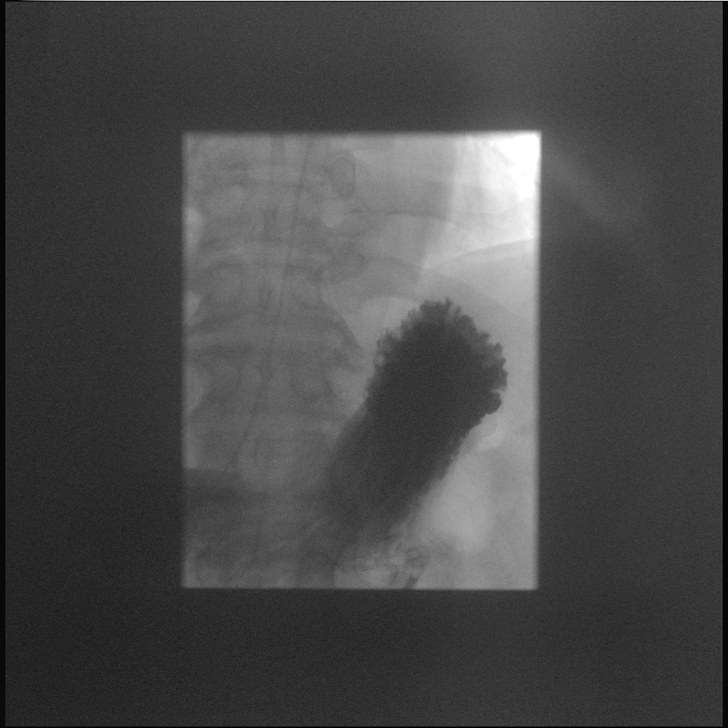

[Series 8: cp_standard · 0.26mm/px · 1 of 1 slices shown (8 of 8)]
[im 1/1]
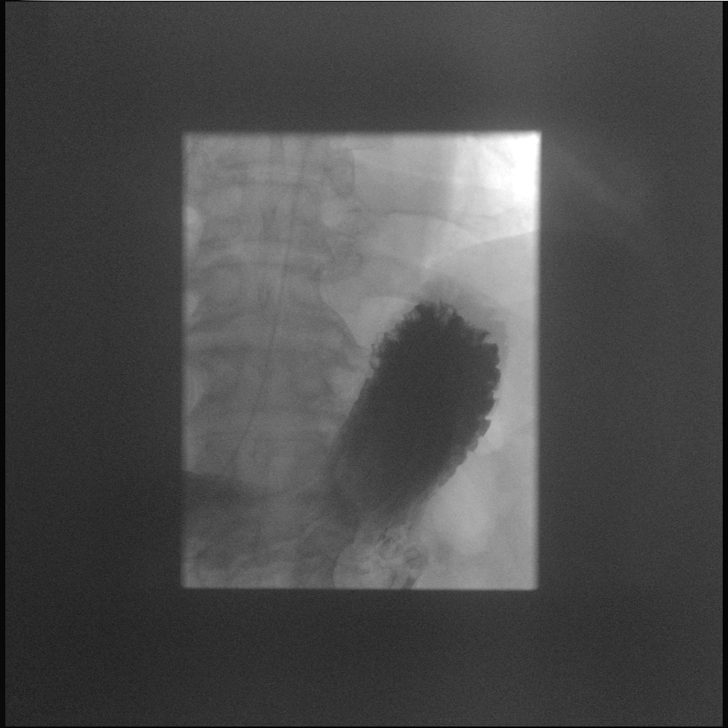

[8 of 8 positions shown; findings below may reference images not displayed]

FINDINGS: Peg tube is present projecting over the stomach. 100 mL of thin
barium was hand injected through the PEG tube into the stomach. The
contrast opacifies the stomach. There is emptying of contrast into
the proximal small bowel. During the period of observation, there
was no gastroesophageal reflux.
IMPRESSION: No evidence of gastroesophageal reflux.

## 2020-08-02 MED ORDER — OSMOLITE 1.5 CAL PO LIQD
237.0000 mL | ORAL | Status: DC
  Administered 2020-08-02 – 2020-08-08 (×43): 237 mL

## 2020-08-02 NOTE — Consult Note (Signed)
Ryan Day, [place] [id number] 1951-06-05 Ryan Nearing, MD  Reason for Consult: Routine trach care Requesting Physician: [name], MD Consulting Physician: Ryan Nearing, MD  HPI: This 69 y.o. 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 He was scheduled to see me in the office this week for routine trach care but was admitted for aspiration pneumonia. The hospitalist asked if I could come by since he was in the hospital, though in terms of the trach, there were no specific concerns. Reviewing the chart and his prior speech and swallowing evaluation, they have recommended he be NPO due to aspiration concerns and has a PEG for nutrition. The trach remains useful for pulmonary toilet as he gets lots of secretions. Since I was contacted, his lung condition has worsened and he is now in the CCU. He had some bronchial mucous plugging requiring bronchoscopy recently by Dr. Mortimer Fries. It is not clear whether the trach was changed at that time. Talking with the respiratory therapist they are not having any issues with the trach in terms of plugging or difficulty suctioning. The patient was quite sedated when I saw him today, and not answering questions, though I understand he had been able to talk with the trach previously.   Medications:  Current Facility-Administered Medications  Medication Dose Route Frequency Provider Last Rate Last Admin  . 0.9 %  sodium chloride infusion  250 mL Intravenous Continuous Para Skeans, MD   Held at 07/25/20 2347  . acetaminophen (TYLENOL) 160 MG/5ML solution 320-640 mg  320-640 mg Per  Tube Q4H PRN Para Skeans, MD   640 mg at 07/31/20 2117  . ascorbic acid (VITAMIN C) tablet 500 mg  500 mg Per Tube BID Para Skeans, MD   500 mg at 08/02/20 0901  . baclofen (LIORESAL) tablet 10 mg  10 mg Per Tube TID Nicole Kindred A, DO   10 mg at 08/01/20 2125  . bisacodyl (DULCOLAX) suppository 10 mg  10 mg Rectal QHS Nicole Kindred A, DO   10 mg at 08/01/20 2126  . bisacodyl (DULCOLAX) suppository 10 mg  10 mg Rectal Daily PRN Nicole Kindred A, DO      . chlorhexidine (PERIDEX) 0.12 % solution 15 mL  15 mL Mouth Rinse BID Florina Ou V, MD   15 mL at 08/02/20 0900  . Chlorhexidine Gluconate Cloth 2 % PADS 6 each  6 each Topical Daily Para Skeans, MD   6 each at 08/01/20 1403  . collagenase (SANTYL) ointment   Topical Daily Ezekiel Slocumb, DO   Given at 08/01/20 0919  . dextrose 5 % with KCl 20 mEq / L  infusion  20 mEq Intravenous Continuous Nicole Kindred A, DO 75 mL/hr at 08/02/20 0600 Rate Verify at 08/02/20 0600  . enoxaparin (LOVENOX) injection 40 mg  40 mg Subcutaneous Q24H Florina Ou V, MD   40 mg at 08/02/20 0900  . famotidine (PEPCID) tablet 20 mg  20 mg Per Tube Daily Para Skeans, MD   20 mg at 08/02/20 0901  . feeding supplement (OSMOLITE 1.5 CAL) liquid 237 mL  237 mL Per Tube Q3H Nicole Kindred A, DO   237  mL at 08/02/20 0835  . feeding supplement (PROSource TF) liquid 45 mL  45 mL Per Tube BID Para Skeans, MD   45 mL at 08/02/20 0900  . ferrous sulfate 220 (44 Fe) MG/5ML solution 75 mg  75 mg Per Tube Daily Para Skeans, MD   75 mg at 08/01/20 0910  . fludrocortisone (FLORINEF) tablet 0.2 mg  0.2 mg Per Tube Daily Para Skeans, MD   0.2 mg at 08/01/20 0911  . FLUoxetine (PROZAC) 20 MG/5ML solution 40 mg  40 mg Per Tube Daily Florina Ou V, MD   40 mg at 08/01/20 0910  . free water 400 mL  400 mL Per Tube Q4H Nicole Kindred A, DO   400 mL at 08/02/20 0859  . guaiFENesin (ROBITUSSIN) 100 MG/5ML solution 400 mg  20 mL Per Tube Q6H Lucrezia Starch, MD   400 mg at  08/02/20 0516  . hydrocortisone cream 1 %   Topical TID PRN Sharion Settler, NP      . lidocaine (LIDODERM) 5 % 1 patch  1 patch Transdermal Q24H Nicole Kindred A, DO   1 patch at 08/01/20 1312  . lidocaine (XYLOCAINE) 2 % viscous mouth solution 15 mL  15 mL Mouth/Throat Once Flora Lipps, MD      . MEDLINE mouth rinse  15 mL Mouth Rinse q12n4p Para Skeans, MD   15 mL at 08/01/20 1528  . methylPREDNISolone sodium succinate (SOLU-MEDROL) 40 mg/mL injection 40 mg  40 mg Intravenous Q12H Rust-Chester, Britton L, NP   40 mg at 08/02/20 0007  . midodrine (PROAMATINE) tablet 10 mg  10 mg Per Tube TID WC Para Skeans, MD   10 mg at 08/02/20 0835  . multivitamin liquid 5 mL  5 mL Per Tube Daily Para Skeans, MD   5 mL at 08/01/20 0910  . nutrition supplement (JUVEN) (JUVEN) powder packet 1 packet  1 packet Per Tube BID BM Nicole Kindred A, DO   1 packet at 08/01/20 1310  . piperacillin-tazobactam (ZOSYN) IVPB 3.375 g  3.375 g Intravenous Q8H Nicole Kindred A, DO 12.5 mL/hr at 08/02/20 0600 Rate Verify at 08/02/20 0600  . polycarbophil (FIBERCON) tablet 625 mg  625 mg Per Tube Daily Para Skeans, MD   625 mg at 08/01/20 0911  . saccharomyces boulardii (FLORASTOR) capsule 250 mg  250 mg Per Tube BID Para Skeans, MD   250 mg at 08/01/20 2125  . scopolamine (TRANSDERM-SCOP) 1 MG/3DAYS 1.5 mg  1 patch Transdermal Q72H Florina Ou V, MD   1.5 mg at 07/31/20 1011  . Simethicone LIQD 80 mg  80 mg Per Tube QID Para Skeans, MD   80 mg at 08/02/20 0903  . traZODone (DESYREL) tablet 50 mg  50 mg Per Tube QHS Para Skeans, MD   50 mg at 08/01/20 2126  .  Medications Prior to Admission  Medication Sig Dispense Refill  . acetaminophen (TYLENOL) 160 MG/5ML solution Place 10-20 mLs (320-640 mg total) into feeding tube every 4 (four) hours as needed for mild pain. 120 mL 0  . ascorbic acid (VITAMIN C) 500 MG tablet Place 1 tablet (500 mg total) into feeding tube 2 (two) times daily.    . Baclofen 5 MG TABS  Place 5 mg into feeding tube 3 (three) times daily. 90 tablet 0  . bisacodyl (DULCOLAX) 10 MG suppository Place 1 suppository (10 mg total) rectally at bedtime. 30 suppository 2  . chlorhexidine (  PERIDEX) 0.12 % solution 15 mLs by Mouth Rinse route 2 (two) times daily. 1893 mL 0  . collagenase (SANTYL) ointment Apply topically daily. 30 g 3  . enoxaparin (LOVENOX) 40 MG/0.4ML injection Inject 0.4 mLs (40 mg total) into the skin daily. (Patient taking differently: Inject 40 mg into the skin every evening. ) 12 mL 2  . famotidine (PEPCID) 20 MG tablet Place 1 tablet (20 mg total) into feeding tube daily. 30 tablet 2  . ferrous sulfate 300 (60 Fe) MG/5ML syrup Place 1.3 mLs (78 mg total) into feeding tube daily. 150 mL 0  . fludrocortisone (FLORINEF) 0.1 MG tablet Place 2 tablets (0.2 mg total) into feeding tube daily. 60 tablet 2  . FLUoxetine (PROZAC) 20 MG/5ML solution Place 10 mLs (40 mg total) into feeding tube daily. 1240 mL 0  . guaiFENesin 200 MG tablet Place 2 tablets (400 mg total) into feeding tube every 6 (six) hours. 240 tablet 0  . lidocaine (LIDODERM) 5 % Place 1 patch onto the skin daily. Remove & Discard patch within 12 hours or as directed by MD 30 patch 0  . midodrine (PROAMATINE) 10 MG tablet Place 1 tablet (10 mg total) into feeding tube 3 (three) times daily with meals. 90 tablet 2  . Mouthwashes (MOUTH RINSE) LIQD solution 15 mLs by Mouth Rinse route 2 times daily at 12 noon and 4 pm. 946 mL 2  . Multiple Vitamin (MULTIVITAMIN) LIQD Place 5 mLs into feeding tube daily. 870 mL 1  . nutrition supplement, JUVEN, (JUVEN) PACK Place 1 packet into feeding tube 2 (two) times daily between meals. 60 packet 2  . Nutritional Supplements (FEEDING SUPPLEMENT, OSMOLITE 1.5 CAL,) LIQD Place 474 mLs into feeding tube 4 (four) times daily.  0  . Nutritional Supplements (FEEDING SUPPLEMENT, PROSOURCE TF,) liquid Place 45 mLs into feeding tube 2 (two) times daily. 270 mL 2  . ondansetron (ZOFRAN)  4 MG tablet Place 1 tablet (4 mg total) into feeding tube every 8 (eight) hours as needed for nausea, vomiting or refractory nausea / vomiting. 20 tablet 0  . polycarbophil (FIBERCON) 625 MG tablet Place 1 tablet (625 mg total) into feeding tube daily. 30 tablet 2  . saccharomyces boulardii (FLORASTOR) 250 MG capsule Place 1 capsule (250 mg total) into feeding tube 2 (two) times daily. 60 capsule 1  . scopolamine (TRANSDERM-SCOP) 1 MG/3DAYS Place 1 patch (1.5 mg total) onto the skin every 3 (three) days. 10 patch 12  . sennosides (SENOKOT) 8.8 MG/5ML syrup Place 5 mLs into feeding tube daily at 6 (six) AM. 240 mL 1  . simethicone (MYLICON) 40 NM/0.7WK drops Place 1.2 mLs (80 mg total) into feeding tube 4 (four) times daily. 30 mL 0  . traZODone (DESYREL) 50 MG tablet Place 1 tablet (50 mg total) into feeding tube at bedtime. 30 tablet 2  . Water For Irrigation, Sterile (FREE WATER) SOLN Place 400 mLs into feeding tube every 4 (four) hours. (Patient taking differently: Place 300 mLs into feeding tube every 4 (four) hours. )    . zinc sulfate 220 (50 Zn) MG capsule Place 1 capsule (220 mg total) into feeding tube daily. 30 capsule 2    Allergies: No Known Allergies  PMH:  Past Medical History:  Diagnosis Date  . Acute on chronic respiratory failure with hypoxia ([place])   . Acute renal injury due to hypovolemia ([place])   . Autonomic instability   . Cardiac arrest ([place])   . Cervical spinal cord injury,  sequela ([place])   . Elevated alkaline phosphatase level   . 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 Hyperlipidemia   . Hypertension     Fam Hx:  Family History  Problem Relation Age of Onset  . Cancer Father        lung  . Cancer Brother        throat  . Heart disease Brother 39  . Liver cancer Brother   . Alcohol abuse Brother     Soc Hx:  Social History   Socioeconomic History  . Marital status: Married    Spouse name: Not on file  . Number of children: Not on file  .  Years of education: Not on file  . Highest education level: Not on file  Occupational History  . Not on file  Tobacco Use  . Smoking status: Never Smoker  . Smokeless tobacco: Never Used  Substance and Sexual Activity  . Alcohol use: Yes    Comment: 1 or less  . Drug use: No  . Sexual activity: Not on file  Other Topics Concern  . Not on file  Social History Narrative  . Not on file   Social Determinants of Health   Financial Resource Strain:   . Difficulty of Paying Living Expenses: Not on file  Food Insecurity:   . Worried About Charity fundraiser in the Last Year: Not on file  . Ran Out of Food in the Last Year: Not on file  Transportation Needs:   . Lack of Transportation (Medical): Not on file  . Lack of Transportation (Non-Medical): Not on file  Physical Activity:   . Days of Exercise per Week: Not on file  . Minutes of Exercise per Session: Not on file  Stress:   . Feeling of Stress : Not on file  Social Connections:   . Frequency of Communication with Friends and Family: Not on file  . Frequency of Social Gatherings with Friends and Family: Not on file  . Attends Religious Services: Not on file  . Active Member of Clubs or Organizations: Not on file  . Attends Archivist Meetings: Not on file  . Marital Status: Not on file  Intimate Partner Violence:   . Fear of Current or Ex-Partner: Not on file  . Emotionally Abused: Not on file  . Physically Abused: Not on file  . Sexually Abused: Not on file    PSH:  Past Surgical History:  Procedure Laterality Date  . COLONOSCOPY WITH PROPOFOL N/A 12/05/2017   Procedure: COLONOSCOPY WITH PROPOFOL;  Surgeon: Lin Landsman, MD;  Location: [facility] ENDOSCOPY;  Service: Gastroenterology;  Laterality: N/A;  . ESOPHAGOGASTRODUODENOSCOPY  12/05/2017   Procedure: ESOPHAGOGASTRODUODENOSCOPY (EGD);  Surgeon: Lin Landsman, MD;  Location: [facility] ENDOSCOPY;  Service: Gastroenterology;;  . Procedures since  admission: No admission procedures for hospital encounter.  ROS: Review of systems unable to obtain at this time.  PHYSICAL EXAM  Vitals: Blood pressure 119/64, pulse (!) 103, temperature 97.6 F (36.4 C), temperature source Axillary, resp. rate (!) 22, height _0  (1.956 m), weight 108 kg, SpO2 100 %.. General: Well-developed, Well-nourished in no acute distress Mood: Mood and affect well adjusted, pleasant and cooperative. Orientation: Grossly alert and oriented. Vocal Quality: No hoarseness. Communicates verbally. head and Face: NCAT. No facial asymmetry. No visible skin lesions. No significant facial scars. No tenderness with sinus percussion. Facial strength normal and symmetric. Nose: External nose normal with midline dorsum and no lesions  or deformity. Nasal Cavity reveals essentially midline septum with normal inferior turbinates. No significant mucosal congestion or erythema. Nasal secretions are minimal and clear. No polyps seen on anterior rhinoscopy. Oral Cavity/ Oropharynx: Lips are normal with no lesions. Exam limited as patient was not responding to cooperate with exam. Neck: Supple and symmetric with no palpable masses, tenderness or crepitance. The trachea is midline. Tracheostomy tube is in place and appears patent. Watched respiratory therapy easily suction tube, no bleeding. This is a #6 Shiley uncuffed tube with disposable inner cannula. Thyroid gland is soft, nontender and symmetric with no masses or enlargement. Parotid and submandibular glands are soft, nontender and symmetric, without masses. Lymphatic: Cervical lymph nodes are without palpable lymphadenopathy or tenderness. Respiratory: Normal respiratory effort mildly tachypnic. Cardiovascular: Carotid pulse shows mild tachycardia  Neurologic: unable to assess, sedated   MEDICAL DECISION MAKING: Data Review:  Results for orders placed or performed during the hospital encounter of 07/25/20 (from the past 48 hour(s))   Culture, bal-quantitative     Status: None (Preliminary result)   Collection Time: 07/31/20  7:40 PM   Specimen: Bronchoalveolar Lavage; Respiratory  Result Value Ref Range   Specimen Description      BRONCHIAL ALVEOLAR LAVAGE Performed at [facility], [address] [place], [place] [address]    Special Requests NONE    Gram Stain      FEW WBC PRESENT, PREDOMINANTLY PMN NO ORGANISMS SEEN    Culture      NO GROWTH 2 DAYS Performed at [facility] [address], [place], [place] [address]    Report Status PENDING   CBC     Status: Abnormal   Collection Time: 08/01/20 12:02 AM  Result Value Ref Range   WBC 12.9 (H) 4.0 - 10.5 K/uL   RBC 2.69 (L) 4.22 - 5.81 MIL/uL   Hemoglobin 7.5 (L) 13.0 - 17.0 g/dL   HCT 24.4 (L) 39 - 52 %   MCV 90.7 80.0 - 100.0 fL   MCH 27.9 26.0 - 34.0 pg   MCHC 30.7 30.0 - 36.0 g/dL   RDW 17.0 (H) 11.5 - 15.5 %   Platelets 270 150 - 400 K/uL   nRBC 0.2 0.0 - 0.2 %    Comment: Performed at [facility], [address], [place], [place] [address]  Magnesium     Status: Abnormal   Collection Time: 08/01/20 12:02 AM  Result Value Ref Range   Magnesium 2.5 (H) 1.7 - 2.4 mg/dL    Comment: Performed at [facility], [address] [place], [place] [address]  Basic metabolic panel     Status: Abnormal   Collection Time: 08/01/20 12:02 AM  Result Value Ref Range   Sodium 148 (H) 135 - 145 mmol/L   Potassium 3.6 3.5 - 5.1 mmol/L   Chloride 108 98 - 111 mmol/L   CO2 29 22 - 32 mmol/L   Glucose, Bld 156 (H) 70 - 99 mg/dL    Comment: Glucose reference range applies only to samples taken after fasting for at least 8 hours.   BUN 28 (H) 8 - 23 mg/dL   Creatinine, Ser 0.93 0.61 - 1.24 mg/dL   Calcium 9.2 8.9 - 10.3 mg/dL   GFR, Estimated >60 >60 mL/min    Comment: (NOTE) Calculated using the CKD-EPI Creatinine Equation (2021)    Anion gap 11 5 - 15    Comment: Performed at [facility], [address] [place], [place]  [address]  Basic metabolic panel     Status: Abnormal   Collection Time: 08/01/20  3:44 AM  Result Value Ref Range   Sodium 148 (H) 135 - 145 mmol/L   Potassium 3.4 (L) 3.5 - 5.1 mmol/L   Chloride 110 98 - 111 mmol/L   CO2 29 22 - 32 mmol/L   Glucose, Bld 208 (H) 70 - 99 mg/dL    Comment: Glucose reference range applies only to samples taken after fasting for at least 8 hours.   BUN 27 (H) 8 - 23 mg/dL   Creatinine, Ser 0.95 0.61 - 1.24 mg/dL   Calcium 9.3 8.9 - 10.3 mg/dL   GFR, Estimated >60 >60 mL/min    Comment: (NOTE) Calculated using the CKD-EPI Creatinine Equation (2021)    Anion gap 9 5 - 15    Comment: Performed at [facility], [address] [place], [place] [address]  CBC     Status: Abnormal   Collection Time: 08/01/20  3:44 AM  Result Value Ref Range   WBC 12.5 (H) 4.0 - 10.5 K/uL   RBC 2.69 (L) 4.22 - 5.81 MIL/uL   Hemoglobin 7.6 (L) 13.0 - 17.0 g/dL   HCT 24.4 (L) 39 - 52 %   MCV 90.7 80.0 - 100.0 fL   MCH 28.3 26.0 - 34.0 pg   MCHC 31.1 30.0 - 36.0 g/dL   RDW 17.1 (H) 11.5 - 15.5 %   Platelets 253 150 - 400 K/uL   nRBC 0.2 0.0 - 0.2 %    Comment: Performed at [facility], [address] [place], [place] [address]  Glucose, capillary     Status: Abnormal   Collection Time: 08/02/20 12:21 AM  Result Value Ref Range   Glucose-Capillary 129 (H) 70 - 99 mg/dL    Comment: Glucose reference range applies only to samples taken after fasting for at least 8 hours.  CBC with Differential/Platelet     Status: Abnormal   Collection Time: 08/02/20  6:34 AM  Result Value Ref Range   WBC 13.9 (H) 4.0 - 10.5 K/uL   RBC 3.07 (L) 4.22 - 5.81 MIL/uL   Hemoglobin 8.4 (L) 13.0 - 17.0 g/dL   HCT 27.7 (L) 39 - 52 %   MCV 90.2 80.0 - 100.0 fL   MCH 27.4 26.0 - 34.0 pg   MCHC 30.3 30.0 - 36.0 g/dL   RDW 16.9 (H) 11.5 - 15.5 %   Platelets 256 150 - 400 K/uL   nRBC 0.0 0.0 - 0.2 %   Neutrophils Relative % 90 %   Neutro Abs 12.5 (H) 1.7 -  7.7 K/uL   Lymphocytes Relative 6 %   Lymphs Abs 0.9 0.7 - 4.0 K/uL   Monocytes Relative 3 %   Monocytes Absolute 0.5 0.1 - 1.0 K/uL   Eosinophils Relative 0 %   Eosinophils Absolute 0.0 0.0 - 0.5 K/uL   Basophils Relative 0 %   Basophils Absolute 0.0 0.0 - 0.1 K/uL   Immature Granulocytes 1 %   Abs Immature Granulocytes 0.07 0.00 - 0.07 K/uL    Comment: Performed at [facility], [address] [place],  [address]  Basic metabolic panel     Status: Abnormal   Collection Time: 08/02/20  7:23 AM  Result Value Ref Range   Sodium 146 (H) 135 - 145 mmol/L   Potassium 4.0 3.5 - 5.1 mmol/L   Chloride 109 98 - 111 mmol/L   CO2 27 22 -  32 mmol/L   Glucose, Bld 174 (H) 70 - 99 mg/dL    Comment: Glucose reference range applies only to samples taken after fasting for at least 8 hours.   BUN 27 (H) 8 - 23 mg/dL   Creatinine, Ser 0.70 0.61 - 1.24 mg/dL   Calcium 9.5 8.9 - 10.3 mg/dL   GFR, Estimated >60 >60 mL/min    Comment: (NOTE) Calculated using the CKD-EPI Creatinine Equation (2021)    Anion gap 10 5 - 15    Comment: Performed at [facility], [address] [place], [place] [address]  Magnesium     Status: Abnormal   Collection Time: 08/02/20  7:23 AM  Result Value Ref Range   Magnesium 2.5 (H) 1.7 - 2.4 mg/dL    Comment: Performed at [facility], [address] [place], [place] [address]  Procalcitonin     Status: None   Collection Time: 08/02/20  7:23 AM  Result Value Ref Range   Procalcitonin 0.39 ng/mL    Comment:        Interpretation: PCT (Procalcitonin) <= 0.5 ng/mL: Systemic infection (sepsis) is not likely. Local bacterial infection is possible. (NOTE)       Sepsis PCT Algorithm           Lower Respiratory Tract                                      Infection PCT Algorithm    ----------------------------     ----------------------------         PCT < 0.25 ng/mL                PCT < 0.10 ng/mL          Strongly encourage              Strongly discourage   discontinuation of antibiotics    initiation of antibiotics    ----------------------------     -----------------------------       PCT 0.25 - 0.50 ng/mL            PCT 0.10 - 0.25 ng/mL               OR       >80% decrease in PCT            Discourage initiation of                                            antibiotics      Encourage discontinuation           of antibiotics    ----------------------------     -----------------------------         PCT >= 0.50 ng/mL              PCT 0.26 - 0.50 ng/mL               AND        <80% decrease in PCT             Encourage initiation of  antibiotics       Encourage continuation           of antibiotics    ----------------------------     -----------------------------        PCT >= 0.50 ng/mL                  PCT > 0.50 ng/mL               AND         increase in PCT                  Strongly encourage                                      initiation of antibiotics    Strongly encourage escalation           of antibiotics                                     -----------------------------                                           PCT <= 0.25 ng/mL                                                 OR                                        > 80% decrease in PCT                                      Discontinue / Do not initiate                                             antibiotics  Performed at [facility], [address], [place], [place] [address]   Phosphorus     Status: None   Collection Time: 08/02/20  7:23 AM  Result Value Ref Range   Phosphorus 3.6 2.5 - 4.6 mg/dL    Comment: Performed at [facility], [address] [place], [place] [address]  . DG Chest Port 1 View  Result Date: 08/02/2020 CLINICAL DATA:  Aspiration pneumonia EXAM: PORTABLE CHEST 1 VIEW COMPARISON:  Yesterday FINDINGS: Improved inflation of the right lung. There is still  diffuse hazy and interstitial opacity. Tracheostomy tube in place. Stable heart size and mediastinal contours. Bulky endplate spurring IMPRESSION: 1. Improved inflation of the right lung. 2. Diffuse interstitial and airspace opacity, question superimposed edema. Electronically Signed   By: Monte Fantasia M.D.   On: 08/02/2020 05:00   DG Chest Port 1 View  Result Date: 08/01/2020 CLINICAL DATA:  Aspiration pneumonia.  Hypoxia and tachypnea. EXAM: PORTABLE CHEST 1 VIEW COMPARISON:  08/01/2020 FINDINGS:  Progressive collapse on the right with increased midline shift to the right. Small right effusion. Left lung remains clear.  Tracheostomy remains in good position. IMPRESSION: Progressive airspace disease and volume loss on the right compatible with partial collapse of the right lung. Right bronchial cut off suggesting mucous plugging Electronically Signed   By: Franchot Gallo M.D.   On: 08/01/2020 08:34   DG Chest Port 1 View  Result Date: 08/01/2020 CLINICAL DATA:  Acute respiratory failure with hypoxia EXAM: PORTABLE CHEST 1 VIEW COMPARISON:  Yesterday FINDINGS: Streaky and hazy opacity on the right with volume loss. There was a chest CT at outside hospital 05/09/2020 with no mention of hilar or pulmonary mass. Relatively clear left lung. Cardiac enlargement. No visible effusion or pneumothorax. Tracheostomy tube in place. IMPRESSION: Unchanged right pulmonary opacification with volume loss. Electronically Signed   By: Monte Fantasia M.D.   On: 08/01/2020 07:55   DG Chest Port 1 View  Result Date: 07/31/2020 CLINICAL DATA:  Respiratory failure. EXAM: PORTABLE CHEST 1 VIEW COMPARISON:  07/31/2020 FINDINGS: At 1949 hours. The cardio pericardial silhouette is enlarged. Low lung volumes. Interval decrease in right lung collapse/consolidative opacity. Tracheostomy tube again noted. Telemetry leads overlie the chest. IMPRESSION: 1. Interval decrease in right lung collapse/consolidative opacity. 2. Low lung  volumes. Electronically Signed   By: Misty Stanley M.D.   On: 07/31/2020 20:13   DG Chest Port 1 View  Result Date: 07/31/2020 CLINICAL DATA:  Worsening shortness of breath today. History of hypertension. EXAM: PORTABLE CHEST 1 VIEW COMPARISON:  07/25/2020 FINDINGS: Tracheostomy in place. Left lung remains largely clear. New development of infiltrate, volume loss and consolidation in the mid and lower lung on the right. IMPRESSION: New infiltrate, volume loss and consolidation in the mid and lower lung on the right. Electronically Signed   By: Nelson Chimes M.D.   On: 07/31/2020 17:14  .   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 Appears to have long term need for trach for pulmonary toilet. Swallowing assessment identified aspiration risk, remains NPO.  PLAN: For now, given the unstable clinical situation, will leave current trach in place. If he deteriorates further it is possible he will need ventilatory support in which case a cuffed trach or endotracheal tube will need to be traded out, possibly emergently. This is a well established trach stoma at this point, so this should be something the CCU can do acutely if the situation arises.   If he stabilizes I can try to come by at some point and place a new trach tube, or he can f/u as an outpatient for this. There is no urgent need to change it at this time. Certainly an option if aspiration issues remain a concern is switching to a cuffed tube to protect the lower airway from upper airway secretions, though this would prevent the patient from communicating verbally.    Ryan Nearing, MD 08/02/2020 12:50 PM

## 2020-08-02 NOTE — Progress Notes (Signed)
CRITICAL CARE NOTE  CC  follow up respiratory failure  SUBJECTIVE Patient remains critically ill Prognosis is guarded  SIGNIFICANT EVENTS  11/2> Admitted to stepdown unit under hospitalist service with aspiration pneumonia and hypotension.  11/3>Transferred to the floor 11/8>Transferred back to stepdown.  PCCM consulted for possible mucus plugging of RLL. 11/8> s/p bedside bronchoscopy 11/9> Mucus plugging again noted on chest xray. Planning on postural drainange   STUDIES:  11/8> Chest xray New infiltrate, volume loss and consolidation in the mid and lower lung on the right. 11/9> Repeat Chest xrayProgressive airspace disease and volume loss on the right compatible with partial collapse of the right lung. Right bronchial cut off suggesting mucous plugging   MICROBIOLOGY: 11/2 BCx: No growth 11/2 UCx:  11/2 Sputum: Gram positive cocci and Gram negative rods 11/2 MRSA PCR: Negative    ANTIBIOTICS: 11/2> Unasyn > completed 11/7  REVIEW OF SYSTEMS  PATIENT IS UNABLE TO PROVIDE COMPLETE REVIEW OF SYSTEMS DUE TO SEVERE CRITICAL ILLNESS   BP (!) 103/56   Pulse 93   Temp 98.7 F (37.1 C) (Axillary)   Resp (!) 30   Ht _0  (1.956 m)   Wt 108 kg   SpO2 90%   BMI 28.23 kg/m   I/O last 3 completed shifts: In: 400 [NG/GT:400] Out: 3150 [Urine:3150] Total I/O In: -  Out: 400 [Urine:400]  SpO2: 90 % O2 Flow Rate (L/min): 10 L/min FiO2 (%): 35 %  Estimated body mass index is 28.23 kg/m as calculated from the following:  Height as of this encounter: _1  (1.956 m).  Weight as of this encounter: 108 kg.  Pressure Injury 07/26/20 Sacrum Unstageable - Full thickness tissue loss in which the base of the injury is covered by slough (yellow, tan, gray, green or brown) and/or eschar (tan, brown or black) in the wound bed. (Active)  07/26/20 0513  Location: Sacrum  Location Orientation:   Staging: Unstageable - Full thickness tissue loss in which the base of the injury  is covered by slough (yellow, tan, gray, green or brown) and/or eschar (tan, brown or black) in the wound bed.  Wound Description (Comments):   Present on Admission: Yes    PHYSICAL EXAMINATION: GENERAL:  69 y.o.-year-old patient lying in the bed with no acute distress. Sitting up in bed. Trach capped EYES: Pupils equal, round, reactive to light and accommodation. No scleral icterus. Extraocular muscles intact.  HEENT: Head atraumatic, normocephalic. Oropharynx and nasopharynx clear.  NECK:  Supple, no jugular venous distention. No thyroid enlargement, no tenderness. + Capped Trach LUNGS: Normal breath sounds bilaterally, no wheezing, Positive for  rales,rhonchi or crepitation. No use of accessory muscles of respiration.  CARDIOVASCULAR: S1, S2 normal. No murmurs, rubs, or gallops.  ABDOMEN: Soft, nontender, nondistended. Bowel sounds present. No organomegaly or mass.  EXTREMITIES: Generalized edema. Dependent edema R>L, cyanosis, or clubbing.  NEUROLOGIC: Cranial nerved grossly intact except as noted otherwise.. Sensation diminished in all extremities.. Gait not checked. Dysphonic speech, B/l UE shoulder abduction 1+/5, distally 0/5 with ?trace flicker left wrist flexor. B/l LE: 0/5 proximal to distal PSYCHIATRIC: The patient is alert and oriented x 3.  SKIN: Sacral ulcer present per wound care. Not assessed.  MEDICATIONS: I have reviewed all medications and confirmed regimen as documented Scheduled Meds: . ascorbic acid  500 mg Per Tube BID  . baclofen  10 mg Per Tube TID  . bisacodyl  10 mg Rectal QHS  . chlorhexidine  15 mL Mouth Rinse BID  . Chlorhexidine  Gluconate Cloth  6 each Topical Daily  . collagenase   Topical Daily  . enoxaparin (LOVENOX) injection  40 mg Subcutaneous Q24H  . famotidine  20 mg Per Tube Daily  . feeding supplement (OSMOLITE 1.5 CAL)  237 mL Per Tube Q3H  . feeding supplement (PROSource TF)  45 mL Per Tube BID  . ferrous sulfate  75 mg Per Tube Daily  .  fludrocortisone  0.2 mg Per Tube Daily  . FLUoxetine  40 mg Per Tube Daily  . free water  400 mL Per Tube Q4H  . guaiFENesin  20 mL Per Tube Q6H  . lidocaine  1 patch Transdermal Q24H  . lidocaine  15 mL Mouth/Throat Once  . mouth rinse  15 mL Mouth Rinse q12n4p  . methylPREDNISolone (SOLU-MEDROL) injection  40 mg Intravenous Q12H  . midodrine  10 mg Per Tube TID WC  . multivitamin  5 mL Per Tube Daily  . nutrition supplement (JUVEN)  1 packet Per Tube BID BM  . polycarbophil  625 mg Per Tube Daily  . saccharomyces boulardii  250 mg Per Tube BID  . scopolamine  1 patch Transdermal Q72H  . Simethicone  80 mg Per Tube QID  . traZODone  50 mg Per Tube QHS   Continuous Infusions: . sodium chloride Stopped (07/25/20 2347)  . dextrose 5 % with KCl 20 mEq / L 75 mL/hr at 08/02/20 0600  . piperacillin-tazobactam (ZOSYN)  IV 12.5 mL/hr at 08/02/20 0600   PRN Meds:.acetaminophen, bisacodyl, hydrocortisone cream  CULTURE RESULTS   Recent Results (from the past 240 hour(s))  Respiratory Panel by RT PCR (Flu A&B, Covid) - Nasopharyngeal Swab     Status: None   Collection Time: 07/25/20  2:11 PM   Specimen: Nasopharyngeal Swab  Result Value Ref Range Status   SARS Coronavirus 2 by RT PCR NEGATIVE NEGATIVE Final    Comment: (NOTE) SARS-CoV-2 target nucleic acids are NOT DETECTED.  The SARS-CoV-2 RNA is generally detectable in upper respiratoy specimens during the acute phase of infection. The lowest concentration of SARS-CoV-2 viral copies this assay can detect is 131 copies/mL. A negative result does not preclude SARS-Cov-2 infection and should not be used as the sole basis for treatment or other patient management decisions. A negative result may occur with  improper specimen collection/handling, submission of specimen other than nasopharyngeal swab, presence of viral mutation(s) within the areas targeted by this assay, and inadequate number of viral copies (<131 copies/mL). A negative  result must be combined with clinical observations, patient history, and epidemiological information. The expected result is Negative.  Fact Sheet for Patients:  PinkCheek.be  Fact Sheet for Healthcare Providers:  GravelBags.it  This test is no t yet approved or cleared by the [place] FDA and  has been authorized for detection and/or diagnosis of SARS-CoV-2 by FDA under an Emergency Use Authorization (EUA). This EUA will remain  in effect (meaning this test can be used) for the duration of the COVID-19 declaration under Section 564(b)(1) of the Act, 21 U.S.C. section 360bbb-3(b)(1), unless the authorization is terminated or revoked sooner.     Influenza A by PCR NEGATIVE NEGATIVE Final   Influenza B by PCR NEGATIVE NEGATIVE Final    Comment: (NOTE) The Xpert Xpress SARS-CoV-2/FLU/RSV assay is intended as an aid in  the diagnosis of influenza from Nasopharyngeal swab specimens and  should not be used as a sole basis for treatment. Nasal washings and  aspirates are unacceptable for Xpert Xpress SARS-CoV-2/FLU/RSV  testing.  Fact Sheet for Patients: PinkCheek.be  Fact Sheet for Healthcare Providers: GravelBags.it  This test is not yet approved or cleared by the [place] FDA and  has been authorized for detection and/or diagnosis of SARS-CoV-2 by  FDA under an Emergency Use Authorization (EUA). This EUA will remain  in effect (meaning this test can be used) for the duration of the  Covid-19 declaration under Section 564(b)(1) of the Act, 21  U.S.C. section 360bbb-3(b)(1), unless the authorization is  terminated or revoked. Performed at [facility], [address] [place], [place] [address]   Blood culture (routine x 2)     Status: None (Preliminary result)   Collection Time: 07/25/20  3:34 PM   Specimen: BLOOD  Result Value Ref Range  Status   Specimen Description BLOOD BLOOD RIGHT FOREARM  Final   Special Requests   Final    BOTTLES DRAWN AEROBIC AND ANAEROBIC Blood Culture adequate volume   Culture   Final    NO GROWTH 4 DAYS Performed at [facility], [address], [place], [place] [address]    Report Status PENDING  Incomplete  Blood culture (routine x 2)     Status: None (Preliminary result)   Collection Time: 07/25/20  3:34 PM   Specimen: BLOOD  Result Value Ref Range Status   Specimen Description BLOOD BLOOD LEFT FOREARM  Final   Special Requests   Final    BOTTLES DRAWN AEROBIC AND ANAEROBIC Blood Culture adequate volume   Culture   Final    NO GROWTH 4 DAYS Performed at [facility], [address], [place], [place] [address]    Report Status PENDING  Incomplete  Culture, respiratory (non-expectorated)     Status: None   Collection Time: 07/25/20  6:36 PM   Specimen: Tracheal Aspirate; Respiratory  Result Value Ref Range Status   Specimen Description   Final    TRACHEAL ASPIRATE Performed at [facility], [address], [place], [place] [address]    Special Requests   Final    NONE Performed at [facility], [address] [place], [place] [address]    Gram Stain   Final    MODERATE WBC PRESENT,BOTH PMN AND MONONUCLEAR FEW GRAM POSITIVE COCCI IN CHAINS FEW GRAM POSITIVE RODS RARE GRAM NEGATIVE RODS    Culture   Final    FEW Normal respiratory flora-no Staph aureus or Pseudomonas seen Performed at [facility], [place] [address], [place], [place] [address]    Report Status 07/28/2020 FINAL  Final  MRSA PCR Screening     Status: None   Collection Time: 07/26/20  4:51 AM   Specimen: Nasopharyngeal  Result Value Ref Range Status   MRSA by PCR NEGATIVE NEGATIVE Final    Comment:        The GeneXpert MRSA Assay (FDA approved for NASAL specimens only), is one component of a comprehensive MRSA colonization surveillance program. It is  not intended to diagnose MRSA infection nor to guide or monitor treatment for MRSA infections. Performed at [facility], [address] [place], [place] [address]   Culture, bal-quantitative     Status: None (Preliminary result)   Collection Time: 07/31/20  7:40 PM   Specimen: Bronchoalveolar Lavage; Respiratory  Result Value Ref Range Status   Specimen Description   Final    BRONCHIAL ALVEOLAR LAVAGE Performed at [facility], [address], [place], [place] [address]    Special Requests NONE  Final   Gram Stain   Final    FEW WBC PRESENT, PREDOMINANTLY PMN NO ORGANISMS SEEN    Culture   Final    NO GROWTH 2 DAYS Performed at [facility] [address], [place], [place] [address]    Report Status PENDING  Incomplete    CBC    Component Value Date/Time   WBC 13.9 (H) 08/02/2020 0634   RBC 3.07 (L) 08/02/2020 0634   HGB 8.4 (L) 08/02/2020 0634   HGB 14.7 02/01/2020 0927   HCT 27.7 (L) 08/02/2020 0634   HCT 44.1 02/01/2020 0927   PLT 256 08/02/2020 0634   PLT 267 02/01/2020 0927   MCV 90.2 08/02/2020 0634   MCV 96 02/01/2020 0927   MCH 27.4 08/02/2020 0634   MCHC 30.3 08/02/2020 0634   RDW 16.9 (H) 08/02/2020 0634   RDW 12.3 02/01/2020 0927   LYMPHSABS 0.9 08/02/2020 0634   LYMPHSABS 1.5 02/01/2020 0927   MONOABS 0.5 08/02/2020 0634   EOSABS 0.0 08/02/2020 0634   EOSABS 0.1 02/01/2020 0927   BASOSABS 0.0 08/02/2020 0634   BASOSABS 0.0 02/01/2020 0927   CMP Latest Ref Rng & Units 08/02/2020 08/01/2020 08/01/2020  Glucose 70 - 99 mg/dL 174(H) 208(H) 156(H)  BUN 8 - 23 mg/dL 27(H) 27(H) 28(H)  Creatinine 0.61 - 1.24 mg/dL 0.70 0.95 0.93  Sodium 135 - 145 mmol/L 146(H) 148(H) 148(H)  Potassium 3.5 - 5.1 mmol/L 4.0 3.4(L) 3.6  Chloride 98 - 111 mmol/L 109 110 108  CO2 22 - 32 mmol/L _0 Calcium 8.9 - 10.3 mg/dL 9.5 9.3 9.2  Total Protein 6.5 - 8.1 g/dL - - -  Total Bilirubin 0.3 - 1.2 mg/dL - - -  Alkaline Phos 38 - 126 U/L -  - -  AST 15 - 41 U/L - - -  ALT 0 - 44 U/L - - -      IMAGING    DG Chest Port 1 View  Result Date: 08/02/2020 CLINICAL DATA:  Aspiration pneumonia EXAM: PORTABLE CHEST 1 VIEW COMPARISON:  Yesterday FINDINGS: Improved inflation of the right lung. There is still diffuse hazy and interstitial opacity. Tracheostomy tube in place. Stable heart size and mediastinal contours. Bulky endplate spurring IMPRESSION: 1. Improved inflation of the right lung. 2. Diffuse interstitial and airspace opacity, question superimposed edema. Electronically Signed   By: Monte Fantasia M.D.   On: 08/02/2020 05:00     Nutrition Status: Nutrition Problem: Inadequate oral intake Etiology: inability to eat Signs/Symptoms: NPO status (reliance on tube feeds and free water per G-tube to meet calorie/protein/hydration needs.) Interventions: Tube feeding, Prostat, MVI    Indwelling Urinary Catheter continued, requirement due to   Reason to continue Indwelling Urinary Catheter strict Intake/Output monitoring for hemodynamic instability   Central Line/ continued, requirement due to  Reason to continue [place] of central venous pressure or other hemodynamic parameters and poor IV access   .SpO2: 99 % O2 Flow Rate (L/min): 10 L/min FiO2 (%): 40 %   ASSESSMENT / PLAN:  69 y.o male with significant PMH of HTN, acute on chronic hypoxic respiratory failure, AKI due to hypovolemia, C5 vertebral body fracture, C3-C6 cord compression with edema s/p C3-C6 laminectomy with fusion and repair of dural tear  (04/26/20), spinal shock, post procedural PTX,r ecurrent aspiration pneumonia with mucus plugging s/p PEG/TRACH 05/04/20, and PEA Cardiac arrest with ROSC admitted on 07/25/20 with aspiration pneumonia. Now with worsening hypoxia, dyspnea and tachypnea on exam. Repeat  chest Xray concerning for mucus plugging s/p bronchoscopy.    Acute on Chronic Hypoxic Respiratory Failure secondary to recurrent aspiration  pneumonia, possible mucus plug on RLL S/p trach placement 05/04/20 S/p Bronchoscopy 11/8. Now with recurrent plugging again noted on this am chest xray -Supplemental O2 as needed to maintain O2 saturations 88 to 92% via trach collar -Follow intermittent ABG and chest x-ray as needed -As needed bronchodilators -Aggressive pulmonary hygiene -suctioning PRN with trach care -Aspiration precautions -Plan postural drainage   Aspiration pneumonia Completed 7 day course of treatment with Unasyn. Spike a temp on 11/6 while on abx -Monitor fever curve -Monitor white count -Follow cultures   Episodic neurogenic hypotension Likely 2/2 spinal cord injury. Appears hemodynamically stable -Continue Midodrine and Florinef as scheduled -SCDs for venous return   Dysphagia s/s SCI S/p PEG placement 8/4 -Hold TF due to aspiration -Aspiration precautions -IVFs hydration while NPO     Iron Deficiency Anemia with baseline hgb 8.0 S/p Venofer infusion 11/6 -Continue iron supplement -Transfuse for hgb<7.0    Transaminitis - resolved Patient noted to have mild elevated LFTs, appears to have been elevated prior. 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 per WOC recommendations       Best practice:  Diet: NPO Pain/Anxiety/Delirium protocol (if indicated): N/A VAP protocol (if indicated): N/A DVT prophylaxis: Lovenox SQ GI prophylaxis: Famotidine Glucose control: N/A Mobility: Quadriplegic Code Status: Full Code Family Communication: Updated patient's wife  who is at the bedside    Critical care provider statement:    Critical care time (minutes):  33   Critical care time was exclusive of:  Separately billable procedures and  treating other patients   Critical care was necessary to treat or prevent imminent or  life-threatening deterioration of the following conditions:  aspiration pneumonia, mucus plugging of airways, pneumonia   Critical care was time spent personally by me on the following  activities:  Development of treatment plan with patient or surrogate,  discussions with consultants, evaluation of patient's response to  treatment, examination of patient, obtaining history from patient or  surrogate, ordering and performing treatments and interventions, ordering  and review of laboratory studies and re-evaluation of patient's condition   I assumed direction of critical care for this patient from another  provider in my specialty: no      Ottie Glazier, M.D.  [facility] [place]

## 2020-08-02 NOTE — Progress Notes (Signed)
PROGRESS NOTE  Ryan Day WUJ:[id number] DOB: [birth year]   PCP: Valerie Roys, DO  Patient is from: Home  DOA: 07/25/2020 LOS: 8  Chief complaints: Shortness of breath  Brief Narrative / Interim history: 69 year old male with quadriplegia s/p fall in August 2021, trach and PEG dependence presenting with shortness of breath, cough with copious amount of sputum, and admitted for acute on chronic respiratory failure with hypoxia due to aspiration pneumonia and intermittent mucous plugging.   Patient continued to have intermittent tachypnea with right mucous plugging after a course of IV antibiotic for aspiration pneumonia.  He also spiked fever and restarted on Zosyn.  BAL culture NGTD.  Subjective: Seen and examined earlier this morning.  No major events overnight of this morning. He responds to question by nodding his head.  Responds no to pain or difficulty breathing.  Does not appear to be in distress.  Objective: Vitals:   08/02/20 0900 08/02/20 1000 08/02/20 1100 08/02/20 1200  BP: (!) 96/53 120/73 104/63 119/64  Pulse: (!) 49 71 (!) 53 (!) 103  Resp: (!) 21 (!) 27 (!) 25 (!) 22  Temp:      TempSrc:      SpO2: 99% 99% 99% 100%  Weight:      Height:        Intake/Output Summary (Last 24 hours) at 08/02/2020 1424 Last data filed at 08/02/2020 0600 Gross per 24 hour  Intake 877.68 ml  Output 1426 ml  Net -548.32 ml   Filed Weights   07/25/20 1300  Weight: 108 kg    Examination:  GENERAL: No apparent distress.  Nontoxic. HEENT: MMM.  Vision and hearing grossly intact.  NECK: Tracheostomy in place. RESP:  No IWOB.  Fair aeration bilaterally. CVS:  RRR. Heart sounds normal.  ABD/GI/GU: BS+. Abd soft, NTND.  PEG tube in place. MSK/EXT:  Moves extremities. No apparent deformity. No edema.  SKIN: no apparent skin lesion or wound NEURO: Awake and alert.  Not able to assess orientation.  Quadriplegia.  Patellar reflex symmetric.  No facial asymmetry. PSYCH: Calm.  Normal affect.  Procedures:  11/8-bronchoscopy  Microbiology summarized: Limited RVP panel nonreactive. MRSA PCR negative. 11/2-Rattray culture with normal flora 11/2-blood cultures NGTD. 11/8-BAL NGTD.  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  Continue for 5 days although fever could be autonomic dysreflexia from spinal cord injury but has leukocytosis and elevated procalcitonin -Appreciate input by general surgery- "will leave current trach in place.  If he deteriorates, he may need emergent ventilatory support in which case a cuffed trach or endotracheal tube will need to be traded out ".  -Wean oxygen as able -Continue bronchodilators, pulmonary hygiene, aspiration precaution and chest physiotherapy -Currently n.p.o. due to aspiration risk per SLP recommendation -PCCM following.  Hypernatremia: Na 146.  Improving. -Continue free water flushes at 400 cc every 4 hours  Hypotension: Episodic likely neurogenic from spinal cord injury.  Normotensive this morning. -Continue midodrine and Florinef  Iron deficiency anemia: Hgb 8.4 (stable). -Received Venofer infusion 11/6 and 11/8 -Continue monitoring.  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  Leukocytosis/bandemia: WBC 14 -Antibiotic as above -Continue monitoring.   Nutrition: Currently n.p.o. due to aspiration risk -Continue gentle IV fluid Body mass index is 28.23 kg/m. Nutrition Problem: Inadequate oral intake Etiology: inability to  eat Signs/Symptoms: NPO status (reliance on tube feeds and free water per G-tube to meet calorie/protein/hydration needs.) Interventions: Tube feeding, Prostat, MVI   Sacral decubitus ulcer, stage II-POA.  Due to quadriplegia.   Reposition every 2 hours WOCN following. Pressure Injury 07/26/20 Sacrum Unstageable - Full thickness tissue loss in which the base of the injury is covered by slough (yellow, tan, gray, green or brown) and/or eschar (tan, brown or black) in the wound bed. (Active)  07/26/20 0513  Location: Sacrum  Location Orientation:   Staging: Unstageable - Full thickness tissue loss in which the base of the injury is covered by slough (yellow, tan, gray, green or brown) and/or eschar (tan, brown or black) in the wound bed.  Wound Description (Comments):   Present on Admission: Yes   DVT prophylaxis:  enoxaparin (LOVENOX) injection 40 mg Start: 07/26/20 1000 SCDs Start: 07/25/20 2243  Code Status: Full code Family Communication: Patient and/or RN. Available if any question.  Status is: Inpatient  Remains inpatient appropriate because:Persistent severe electrolyte disturbances, Unsafe d/c plan, IV treatments appropriate due to intensity of illness or inability to take PO and Inpatient level of care appropriate due to severity of illness   Dispo:  Patient From: Home  Planned Disposition: Home  Expected discharge date: 08/04/20  Medically stable for discharge: No        Consultants:  PCCM   Sch Meds:  Scheduled Meds:  ascorbic acid  500 mg Per Tube BID   baclofen  10 mg Per Tube TID   bisacodyl  10 mg Rectal QHS   chlorhexidine  15 mL Mouth Rinse BID   Chlorhexidine Gluconate Cloth  6 each Topical Daily   collagenase   Topical Daily   enoxaparin (LOVENOX) injection  40 mg Subcutaneous Q24H   famotidine  20 mg Per Tube Daily   feeding supplement (OSMOLITE 1.5 CAL)  237 mL Per Tube Q3H   feeding supplement (PROSource TF)  45 mL Per Tube BID    ferrous sulfate  75 mg Per Tube Daily   fludrocortisone  0.2 mg Per Tube Daily   FLUoxetine  40 mg Per Tube Daily   free water  400 mL Per Tube Q4H   guaiFENesin  20 mL Per Tube Q6H   lidocaine  1 patch Transdermal Q24H   lidocaine  15 mL Mouth/Throat Once   mouth rinse  15 mL Mouth Rinse q12n4p   methylPREDNISolone (SOLU-MEDROL) injection  40 mg Intravenous Q12H   midodrine  10 mg Per Tube TID WC   multivitamin  5 mL Per Tube Daily   nutrition supplement (JUVEN)  1 packet Per Tube BID BM   polycarbophil  625 mg Per Tube Daily   saccharomyces boulardii  250 mg Per Tube BID   scopolamine  1 patch Transdermal Q72H   Simethicone  80 mg Per Tube QID   traZODone  50 mg Per Tube QHS   Continuous Infusions:  sodium chloride Stopped (07/25/20 2347)   dextrose 5 % with KCl 20 mEq / L 75 mL/hr at 08/02/20 0600   piperacillin-tazobactam (ZOSYN)  IV 12.5 mL/hr at 08/02/20 0600   PRN Meds:.acetaminophen, bisacodyl, hydrocortisone cream  Antimicrobials: Anti-infectives (From admission, onward)   Start     Dose/Rate Route Frequency Ordered Stop  08/01/20 1100  piperacillin-tazobactam (ZOSYN) IVPB 3.375 g        3.375 g 12.5 mL/hr over 240 Minutes Intravenous Every 8 hours 08/01/20 0915     07/26/20 1600  Ampicillin-Sulbactam (UNASYN) 3 g in sodium chloride 0.9 % 100 mL IVPB        3 g 200 mL/hr over 30 Minutes Intravenous Every 6 hours 07/26/20 1130 07/30/20 2200   07/26/20 1200  vancomycin (VANCOCIN) IVPB 1000 mg/200 mL premix  Status:  Discontinued        1,000 mg 200 mL/hr over 60 Minutes Intravenous Every 12 hours 07/25/20 2304 07/26/20 1106   07/25/20 2315  vancomycin (VANCOREADY) IVPB 2000 mg/400 mL       "Followed by" Linked Group Details   2,000 mg 200 mL/hr over 120 Minutes Intravenous  Once 07/25/20 2301 07/26/20 0205   07/25/20 2315  vancomycin (VANCOREADY) IVPB 500 mg/100 mL       "Followed by" Linked Group Details   500 mg 100 mL/hr over 60 Minutes  Intravenous  Once 07/25/20 2301 07/26/20 0323   07/25/20 1500  piperacillin-tazobactam (ZOSYN) IVPB 3.375 g  Status:  Discontinued        3.375 g 12.5 mL/hr over 240 Minutes Intravenous Every 8 hours 07/25/20 1446 07/26/20 1129   07/25/20 1430  Ampicillin-Sulbactam (UNASYN) 3 g in sodium chloride 0.9 % 100 mL IVPB  Status:  Discontinued        3 g 200 mL/hr over 30 Minutes Intravenous  Once 07/25/20 1418 07/25/20 1446       I have personally reviewed the following labs and images: CBC: Recent Labs  Lab 07/27/20 0726 07/28/20 0605 07/29/20 0713 07/29/20 0713 07/30/20 0343 07/31/20 0443 08/01/20 0002 08/01/20 0344 08/02/20 0634  WBC 8.5   < > 9.4   < > 8.9 9.9 12.9* 12.5* 13.9*  NEUTROABS 5.8  --  6.6  --  5.9  --   --   --  12.5*  HGB 7.9*   < > 7.0*   < > 7.4* 7.7* 7.5* 7.6* 8.4*  HCT 25.6*   < > 22.8*   < > 24.3* 25.2* 24.4* 24.4* 27.7*  MCV 89.2   < > 90.8   < > 89.7 89.7 90.7 90.7 90.2  PLT 321   < > 322   < > 315 303 270 253 256   < > = values in this interval not displayed.   BMP &GFR Recent Labs  Lab 07/29/20 0713 07/29/20 0713 07/30/20 0343 07/31/20 0443 08/01/20 0002 08/01/20 0344 08/02/20 0723  NA 144   < > 146* 144 148* 148* 146*  K 4.0   < > 3.8 4.0 3.6 3.4* 4.0  CL 108   < > 109 106 108 110 109  CO2 27   < > _0 GLUCOSE 117*   < > 121* 112* 156* 208* 174*  BUN 23   < > 23 25* 28* 27* 27*  CREATININE 0.86   < > 0.86 0.88 0.93 0.95 0.70  CALCIUM 9.4   < > 9.4 9.5 9.2 9.3 9.5  MG 2.3  --   --   --  2.5*  --  2.5*  PHOS  --   --   --   --   --   --  3.6   < > = values in this interval not displayed.   Estimated Creatinine Clearance: 120.9 mL/min (by C-G formula based on SCr of 0.7 mg/dL).  Liver & Pancreas: No results for input(s): AST, ALT, ALKPHOS, BILITOT, PROT, ALBUMIN in the last 168 hours. No results for input(s): LIPASE, AMYLASE in the last 168 hours. No results for input(s): AMMONIA in the last 168 hours. Diabetic: No results for  input(s): HGBA1C in the last 72 hours. Recent Labs  Lab 08/02/20 0021  GLUCAP 129*   Cardiac Enzymes: No results for input(s): CKTOTAL, CKMB, CKMBINDEX, TROPONINI in the last 168 hours. No results for input(s): PROBNP in the last 8760 hours. Coagulation Profile: No results for input(s): INR, PROTIME in the last 168 hours. Thyroid Function Tests: No results for input(s): TSH, T4TOTAL, FREET4, T3FREE, THYROIDAB in the last 72 hours. Lipid Profile: No results for input(s): CHOL, HDL, LDLCALC, TRIG, CHOLHDL, LDLDIRECT in the last 72 hours. Anemia Panel: No results for input(s): VITAMINB12, FOLATE, FERRITIN, TIBC, IRON, RETICCTPCT in the last 72 hours. Urine analysis:    Component Value Date/Time   COLORURINE YELLOW 07/19/2020 1935   APPEARANCEUR CLOUDY (A) 07/19/2020 1935   APPEARANCEUR Clear 02/01/2020 0920   LABSPEC 1.012 07/19/2020 1935   PHURINE 5.0 07/19/2020 1935   GLUCOSEU NEGATIVE 07/19/2020 1935   HGBUR NEGATIVE 07/19/2020 1935   BILIRUBINUR NEGATIVE 07/19/2020 1935   BILIRUBINUR Negative 02/01/2020 0920   KETONESUR NEGATIVE 07/19/2020 1935   PROTEINUR NEGATIVE 07/19/2020 1935   NITRITE NEGATIVE 07/19/2020 1935   [place] (A) 07/19/2020 1935   Sepsis Labs: Invalid input(s): PROCALCITONIN, [place]  Microbiology: Recent Results (from the past 240 hour(s))  Respiratory Panel by RT PCR (Flu A&B, Covid) - Nasopharyngeal Swab     Status: None   Collection Time: 07/25/20  2:11 PM   Specimen: Nasopharyngeal Swab  Result Value Ref Range Status   SARS Coronavirus 2 by RT PCR NEGATIVE NEGATIVE Final    Comment: (NOTE) SARS-CoV-2 target nucleic acids are NOT DETECTED.  The SARS-CoV-2 RNA is generally detectable in upper respiratoy specimens during the acute phase of infection. The lowest concentration of SARS-CoV-2 viral copies this assay can detect is 131 copies/mL. A negative result does not preclude SARS-Cov-2 infection and should not be used as the sole  basis for treatment or other patient management decisions. A negative result may occur with  improper specimen collection/handling, submission of specimen other than nasopharyngeal swab, presence of viral mutation(s) within the areas targeted by this assay, and inadequate number of viral copies (<131 copies/mL). A negative result must be combined with clinical observations, patient history, and epidemiological information. The expected result is Negative.  Fact Sheet for Patients:  PinkCheek.be  Fact Sheet for Healthcare Providers:  GravelBags.it  This test is no t yet approved or cleared by the [place] FDA and  has been authorized for detection and/or diagnosis of SARS-CoV-2 by FDA under an Emergency Use Authorization (EUA). This EUA will remain  in effect (meaning this test can be used) for the duration of the COVID-19 declaration under Section 564(b)(1) of the Act, 21 U.S.C. section 360bbb-3(b)(1), unless the authorization is terminated or revoked sooner.     Influenza A by PCR NEGATIVE NEGATIVE Final   Influenza B by PCR NEGATIVE NEGATIVE Final    Comment: (NOTE) The Xpert Xpress SARS-CoV-2/FLU/RSV assay is intended as an aid in  the diagnosis of influenza from Nasopharyngeal swab specimens and  should not be used as a sole basis for treatment. Nasal washings and  aspirates are unacceptable for Xpert Xpress SARS-CoV-2/FLU/RSV  testing.  Fact Sheet for Patients: PinkCheek.be  Fact Sheet for Healthcare Providers: GravelBags.it  This test  is not yet approved or cleared by the [place] and  has been authorized for detection and/or diagnosis of SARS-CoV-2 by  FDA under an Emergency Use Authorization (EUA). This EUA will remain  in effect (meaning this test can be used) for the duration of the  Covid-19 declaration under Section 564(b)(1) of the  Act, 21  U.S.C. section 360bbb-3(b)(1), unless the authorization is  terminated or revoked. Performed at [facility], [address] [place], [place] [address]   Blood culture (routine x 2)     Status: None (Preliminary result)   Collection Time: 07/25/20  3:34 PM   Specimen: BLOOD  Result Value Ref Range Status   Specimen Description BLOOD BLOOD RIGHT FOREARM  Final   Special Requests   Final    BOTTLES DRAWN AEROBIC AND ANAEROBIC Blood Culture adequate volume   Culture   Final    NO GROWTH 4 DAYS Performed at [facility], [address] [place], [place] [address]    Report Status PENDING  Incomplete  Blood culture (routine x 2)     Status: None (Preliminary result)   Collection Time: 07/25/20  3:34 PM   Specimen: BLOOD  Result Value Ref Range Status   Specimen Description BLOOD BLOOD LEFT FOREARM  Final   Special Requests   Final    BOTTLES DRAWN AEROBIC AND ANAEROBIC Blood Culture adequate volume   Culture   Final    NO GROWTH 4 DAYS Performed at [facility], [address], [place], [place] [address]    Report Status PENDING  Incomplete  Culture, respiratory (non-expectorated)     Status: None   Collection Time: 07/25/20  6:36 PM   Specimen: Tracheal Aspirate; Respiratory  Result Value Ref Range Status   Specimen Description   Final    TRACHEAL ASPIRATE Performed at [facility], [address] [place], [place] [address]    Special Requests   Final    NONE Performed at [facility], [address] [place], [place] [address]    Gram Stain   Final    MODERATE WBC PRESENT,BOTH PMN AND MONONUCLEAR FEW GRAM POSITIVE COCCI IN CHAINS FEW GRAM POSITIVE RODS RARE GRAM NEGATIVE RODS    Culture   Final    FEW Normal respiratory flora-no Staph aureus or Pseudomonas seen Performed at [facility], [place] [address], [place], [place] [address]    Report Status 07/28/2020 FINAL  Final  MRSA PCR Screening     Status:  None   Collection Time: 07/26/20  4:51 AM   Specimen: Nasopharyngeal  Result Value Ref Range Status   MRSA by PCR NEGATIVE NEGATIVE Final    Comment:        The GeneXpert MRSA Assay (FDA approved for NASAL specimens only), is one component of a comprehensive MRSA colonization surveillance program. It is not intended to diagnose MRSA infection nor to guide or monitor treatment for MRSA infections. Performed at [facility], [address] [place], [place] [address]   Culture, bal-quantitative     Status: None (Preliminary result)   Collection Time: 07/31/20  7:40 PM   Specimen: Bronchoalveolar Lavage; Respiratory  Result Value Ref Range Status   Specimen Description   Final    BRONCHIAL ALVEOLAR LAVAGE Performed at [facility], [address] [place], [place] [address]    Special Requests NONE  Final   Gram Stain   Final    FEW WBC PRESENT, PREDOMINANTLY PMN  NO ORGANISMS SEEN    Culture   Final    NO GROWTH 2 DAYS Performed at [facility], [place] [address] [place], [place] [address]    Report Status PENDING  Incomplete    Radiology Studies: DG Chest Port 1 View  Result Date: 08/02/2020 CLINICAL DATA:  Aspiration pneumonia EXAM: PORTABLE CHEST 1 VIEW COMPARISON:  Yesterday FINDINGS: Improved inflation of the right lung. There is still diffuse hazy and interstitial opacity. Tracheostomy tube in place. Stable heart size and mediastinal contours. Bulky endplate spurring IMPRESSION: 1. Improved inflation of the right lung. 2. Diffuse interstitial and airspace opacity, question superimposed edema. Electronically Signed   By: Monte Fantasia M.D.   On: 08/02/2020 05:00   DG UGI W SINGLE CM (SOL OR THIN BA)  Result Date: 08/02/2020 CLINICAL DATA:  Evaluate for gastroesophageal reflux. Aspiration pneumonia. EXAM: UPPER GI SERIES WITHOUT KUB TECHNIQUE: Routine upper GI series was performed with thin and thick barium. FLUOROSCOPY TIME:  Fluoroscopy Time:   0.8 minute Radiation Exposure Index (if provided by the fluoroscopic device): 8.9 mGy Number of Acquired Spot Images: 0 COMPARISON:  None. FINDINGS: Peg tube is present projecting over the stomach. 100 mL of thin barium was hand injected through the PEG tube into the stomach. The contrast opacifies the stomach. There is emptying of contrast into the proximal small bowel. During the period of observation, there was no gastroesophageal reflux. IMPRESSION: No evidence of gastroesophageal reflux. Electronically Signed   By: Kathreen Devoid   On: 08/02/2020 13:43      Clair Alfieri T. [place]  If 7PM-7AM, please contact night-coverage [email or web address] 08/02/2020, 2:24 PM

## 2020-08-02 NOTE — Progress Notes (Addendum)
Nutrition Follow-up  DOCUMENTATION CODES:   Not applicable  INTERVENTION:  Initiate Osmolite 1.5 Cal 1 carton (237 mL) 8 times daily per tube + PROSource TF 45 mL BID per tube. Provides 2920 kcal, 141 grams of protein, 1448 mL H2O daily. Unable to order 8 times daily in Epic. RD has updated times on Q3hrs order so patient receives bolus feeds 8 times daily during awake hours.  Continue free water flush of 400 mL Q4hrs as ordered by MD (increased from 300 mL on 11/5).   Also flush tube with 20 mL before and after each feeding to maintain patency. Patient will receive a total of 4168 mL H2O daily including water in tube feeding regimen.  Continue Juven 1 packet per tube BID. Each packet provides 95 kcal, 7 grams L-Arginine, 7 grams L-Glutamine, 2.5 grams collagen protein, 300 mg vitamin C, 9.5 mg zinc, and other micronutrients essential for wound healing.  Recommend measuring daily weights to aid in assessing fluid status.  NUTRITION DIAGNOSIS:   Inadequate oral intake related to inability to eat as evidenced by NPO status (reliance on tube feeds and free water per G-tube to meet calorie/protein/hydration needs.).  Ongoing.  GOAL:   Patient will meet greater than or equal to 90% of their needs  Progressing with resumption of tube feeds.  MONITOR:   Labs, Weight trends, TF tolerance, Skin, I & O's  REASON FOR ASSESSMENT:   New TF    ASSESSMENT:   69 year old male with PMHx of HLD, HTN, CHF who presented to [facility] on 04/26/2020 after a fall from standing with SCI/quadriplegia s/p tracheostomy and PEG tube placement, discharged to [facility] 06/07/2020, and then was admitted at [place] 10/1-10/28. Patient now admitted with aspiration PNA, hypotension.  11/4 patient transferred from ICU to med/surg 11/6 tube feeds changed to 1 carton 8 times daily by MD 11/8 patient transferred back to ICU due to possible mucus plugging 11/8 s/p  bronchoscopy 11/9 tube feeds held 11/10 s/p upper GI series that found no evidence of gastroesophageal reflux when 100 mL of thin barium was injected through PEG tube into stomach and showed emptying of contrast into small bowel  Met with patient at bedside. He is now on 10 L/min O2 via trach collar. Patient denies any abdominal pain or nausea. Discussed plan for possible resumption of tube feeds pending upper GI series today.   Medications reviewed and include: vitamin C 500 mg BID, Dulcolax 10 mg QHS RE, famotidine, ferrous sulfate 75 mg daily per tube, Solu-Medrol 40 mg Q12hrs IV, liquid MVI daily per tube, simethicone 80 mg QID per tube, D5W with KCl 20 mEq/L at 75 mL/hr, Zosyn.  Labs reviewed: Sodium 146, BUN 27, Magnesium 2.5.  I/O: 2675 mL UOP yesterday (1 mL/kg/hr); per documentation patient is -6415 mL since admission  Enteral Access: 20 Fr. G-tube present on admission (appears to be an [employer] fast streamer G-tube with c-clamp)  Discussed with Critical Care MD. MD ordered upper GI series to assess for esophageal regurgitation but findings were negative. Plan is to resume bolus tube feeds today. Discussed with RN.  Diet Order:   Diet Order            Diet NPO time specified  Diet effective now                EDUCATION NEEDS:   No education needs have been identified at this time  Skin:  Skin Integrity Issues:: Unstageable Unstageable: sacrum  Last BM:  07/26/2020 per chart  Height:   Ht Readings from Last 1 Encounters:  07/25/20 _0  (1.956 m)   Weight:   Wt Readings from Last 1 Encounters:  07/25/20 108 kg   BMI:  Body mass index is 28.23 kg/m.  Estimated Nutritional Needs:   Kcal:  2600-2800  Protein:  145-165 grams  Fluid:  >/= 2.4 L/day  Jacklynn Barnacle, MS, RD, LDN Pager number available on Amion

## 2020-08-03 ENCOUNTER — Inpatient Hospital Stay: Payer: BC Managed Care – PPO

## 2020-08-03 ENCOUNTER — Ambulatory Visit: Payer: BC Managed Care – PPO | Admitting: Internal Medicine

## 2020-08-03 ENCOUNTER — Ambulatory Visit: Payer: BC Managed Care – PPO | Admitting: Family Medicine

## 2020-08-03 LAB — CULTURE, BLOOD (ROUTINE X 2)
Culture: NO GROWTH
Culture: NO GROWTH
Special Requests: ADEQUATE
Special Requests: ADEQUATE

## 2020-08-03 LAB — BASIC METABOLIC PANEL
Anion gap: 8 (ref 5–15)
BUN: 29 mg/dL — ABNORMAL HIGH (ref 8–23)
CO2: 28 mmol/L (ref 22–32)
Calcium: 9.7 mg/dL (ref 8.9–10.3)
Chloride: 106 mmol/L (ref 98–111)
Creatinine, Ser: 0.72 mg/dL (ref 0.61–1.24)
GFR, Estimated: >60 mL/min (ref >60)
Glucose, Bld: 208 mg/dL — ABNORMAL HIGH (ref 70–99)
Potassium: 3.7 mmol/L (ref 3.5–5.1)
Sodium: 142 mmol/L (ref 135–145)

## 2020-08-03 LAB — CULTURE, BAL-QUANTITATIVE W GRAM STAIN: Culture: NO GROWTH

## 2020-08-03 LAB — CBC WITH DIFFERENTIAL/PLATELET
Abs Immature Granulocytes: 0.08 10*3/uL — ABNORMAL HIGH (ref 0.00–0.07)
Basophils Absolute: 0 10*3/uL (ref 0.0–0.1)
Basophils Relative: 0 %
Eosinophils Absolute: 0.1 10*3/uL (ref 0.0–0.5)
Eosinophils Relative: 0 %
HCT: 28.1 % — ABNORMAL LOW (ref 39.0–52.0)
Hemoglobin: 8.6 g/dL — ABNORMAL LOW (ref 13.0–17.0)
Immature Granulocytes: 1 %
Lymphocytes Relative: 7 %
Lymphs Abs: 0.8 10*3/uL (ref 0.7–4.0)
MCH: 27.3 pg (ref 26.0–34.0)
MCHC: 30.6 g/dL (ref 30.0–36.0)
MCV: 89.2 fL (ref 80.0–100.0)
Monocytes Absolute: 0.4 10*3/uL (ref 0.1–1.0)
Monocytes Relative: 4 %
Neutro Abs: 10.1 10*3/uL — ABNORMAL HIGH (ref 1.7–7.7)
Neutrophils Relative %: 88 %
Platelets: 162 10*3/uL (ref 150–400)
RBC: 3.15 MIL/uL — ABNORMAL LOW (ref 4.22–5.81)
RDW: 16.5 % — ABNORMAL HIGH (ref 11.5–15.5)
WBC: 11.5 10*3/uL — ABNORMAL HIGH (ref 4.0–10.5)
nRBC: 0.4 % — ABNORMAL HIGH (ref 0.0–0.2)

## 2020-08-03 LAB — MAGNESIUM: Magnesium: 2.6 mg/dL — ABNORMAL HIGH (ref 1.7–2.4)

## 2020-08-03 LAB — PHOSPHORUS: Phosphorus: 2.4 mg/dL — ABNORMAL LOW (ref 2.5–4.6)

## 2020-08-03 LAB — PROCALCITONIN: Procalcitonin: 0.21 ng/mL

## 2020-08-03 IMAGING — DX DG CHEST 1V PORT
1 series · 1 of 1 positions shown · non-contrast
Comparison: Yesterday

CLINICAL DATA: Shortness of breath

EXAM:
PORTABLE CHEST 1 VIEW

[chest ap]
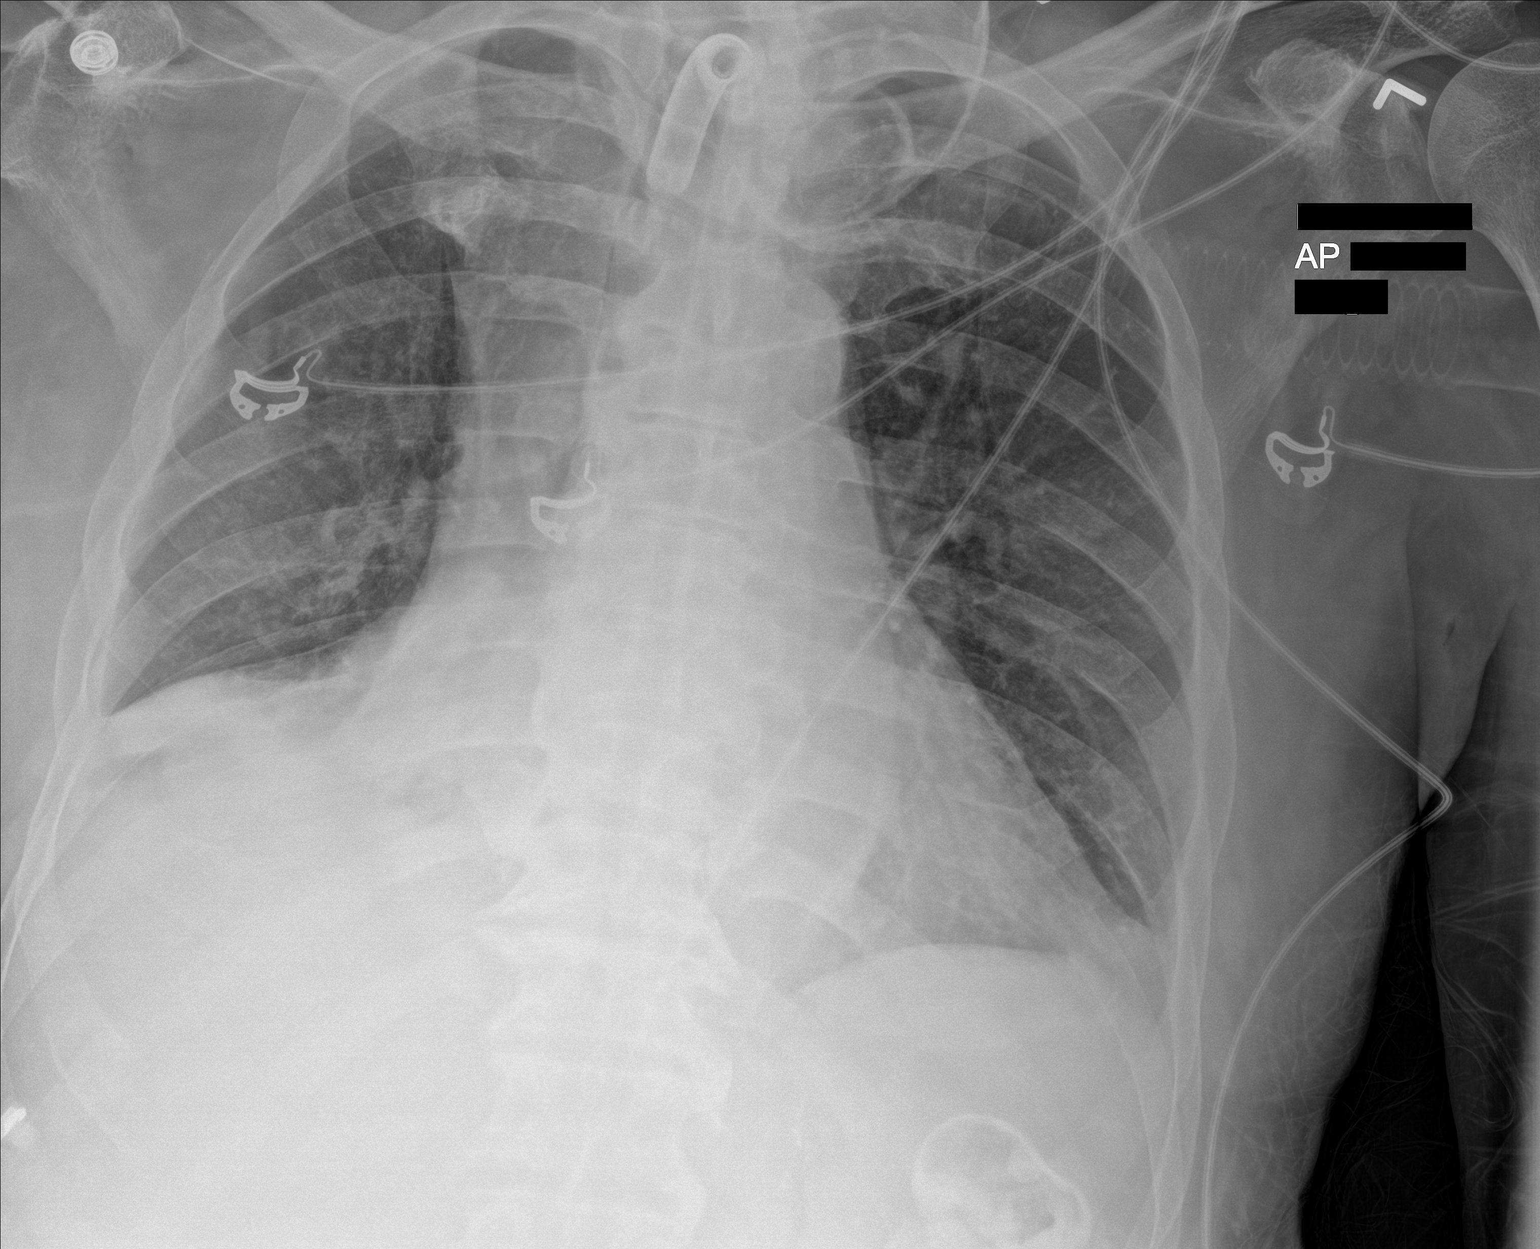

[1 of 1 positions shown; findings below may reference images not displayed]

FINDINGS: Tracheostomy tube in place. Collapse of the right lower lobe and
possibly middle lobe with obscured diaphragm. Hazy density elsewhere
is improved. No visible effusion or pneumothorax. High-density in
the left upper quadrant which is enteric.
IMPRESSION: Interval lobar collapse at the right base.

## 2020-08-03 MED ORDER — GLYCOPYRROLATE 0.2 MG/ML IJ SOLN
0.1000 mg | Freq: Once | INTRAMUSCULAR | Status: AC
  Administered 2020-08-03: 0.1 mg via INTRAVENOUS
  Filled 2020-08-03: qty 1

## 2020-08-03 MED ORDER — GLYCOPYRROLATE 1 MG PO TABS
1.0000 mg | ORAL_TABLET | Freq: Three times a day (TID) | ORAL | Status: DC
  Administered 2020-08-03 – 2020-08-04 (×2): 1 mg via ORAL
  Filled 2020-08-03 (×5): qty 1

## 2020-08-03 MED ORDER — GLYCOPYRROLATE 1 MG PO TABS
1.0000 mg | ORAL_TABLET | Freq: Three times a day (TID) | ORAL | Status: DC
  Administered 2020-08-03: 1 mg via ORAL

## 2020-08-03 NOTE — Progress Notes (Addendum)
PROGRESS NOTE  Ryan Day SWF:[id number] DOB: [birth year]   PCP: Valerie Roys, DO  Patient is from: Home  DOA: 07/25/2020 LOS: 9  Chief complaints: Shortness of breath  Brief Narrative / Interim history: 69 year old male with quadriplegia s/p fall in August 2021, trach and PEG dependence presenting with shortness of breath, cough with copious amount of sputum, and admitted for acute on chronic respiratory failure with hypoxia due to aspiration pneumonia and intermittent mucous plugging.   Patient continued to have intermittent tachypnea with right mucous plugging after a course of IV antibiotic for aspiration pneumonia.  He also spiked fever and restarted on Zosyn.  BAL culture NGTD.  Subjective: Seen and examined earlier this morning.  No major events overnight of this morning.  He responds to question by nodding his head.  Notes no to pain or difficulty breathing.  Does not appear to be in distress.  Objective: Vitals:   08/03/20 1000 08/03/20 1100 08/03/20 1200 08/03/20 1300  BP: (!) 89/53 106/62 (!) 89/58 (!) 91/56  Pulse: (!) 47 (!) 36 (!) 51 (!) 56  Resp: (!) 21 (!) 23 20 (!) 28  Temp:      TempSrc:      SpO2: 99% 98% 99% 99%  Weight:      Height:        Intake/Output Summary (Last 24 hours) at 08/03/2020 1416 Last data filed at 08/03/2020 0241 Gross per 24 hour  Intake 880.83 ml  Output 825 ml  Net 55.83 ml   Filed Weights   07/25/20 1300 08/03/20 0500  Weight: 108 kg 100.1 kg    Examination:   GENERAL: No apparent distress.  Nontoxic. HEENT: MMM.  Vision and hearing grossly intact.  NECK: Tracheostomy in place.  On 3 L / 40% FiO2. RESP: On trach as above.  No IWOB.  Fair aeration bilaterally. CVS:  RRR. Heart sounds normal.  ABD/GI/GU: BS+. Abd soft, NTND.  PEG tube in place. MSK/EXT:quadriplegia. SKIN: no apparent skin lesion or wound NEURO: Awake, alert and oriented appropriately.  Cranial is grossly intact.  Quadriplegia.  Patellar reflex  symmetric. PSYCH: Calm.  No distress or agitation.  Procedures:  11/8-bronchoscopy  Microbiology summarized: Limited RVP panel nonreactive. MRSA PCR negative. 11/2-Rattray culture with normal flora 11/2-blood cultures NGTD. 11/8-BAL NGTD.  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  Leukocytosis and procalcitonin improving.  Culture data as above. 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  PCCM following.  Appreciate help. -General surgery- "will leave current trach in place.  If he deteriorates, he may need emergent ventilatory support in which case a cuffed trach or endotracheal tube will need to be traded out ".  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 -Continue bolus tube feed  Hypernatremia: Resolved. -Continue free water  flushes at 400 cc every 4 hours  Bradycardia/hypotension: Episodic likely neurogenic from spinal cord injury.  Normotensive this morning. -Continue midodrine and Florinef  Iron deficiency anemia: Hgb 8.6. (stable). -Received Venofer infusion 11/6 and 11/8 -Continue monitoring.  Hypokalemia: Resolved.  Leukocytosis/bandemia: WBC 14>11.  Infectious?  Demargination?  Improving. -Antibiotic as above -Continue monitoring.  Goal of care: Overall, grim prognosis.  Palliative has been involved earler in the course. Still full code.  -Reconsult palliative care  Nutrition: Currently n.p.o. due to aspiration risk -Continue gentle IV fluid Body mass index is 26.17 kg/m. Nutrition Problem: Inadequate oral intake Etiology: inability to eat Signs/Symptoms: NPO status (reliance on tube feeds and free water per G-tube to meet calorie/protein/hydration needs.) Interventions: Tube feeding, Prostat, MVI   Sacral decubitus ulcer, stage II-POA.  Due to quadriplegia.   -Reposition every 2 hours -WOCN following. Pressure Injury 07/26/20 Sacrum Unstageable - Full thickness tissue loss in which the base of the injury is covered by slough (yellow, tan, gray, green or brown) and/or eschar (tan, brown or black) in the wound bed. (Active)  07/26/20 0513  Location: Sacrum  Location Orientation:   Staging: Unstageable - Full thickness tissue loss in which the base of the injury is covered by slough (yellow, tan, gray, green or brown) and/or eschar (tan, brown or black) in the wound bed.  Wound Description (Comments):   Present on Admission: Yes   DVT prophylaxis:  enoxaparin (LOVENOX) injection 40 mg Start: 07/26/20 1000 SCDs Start: 07/25/20 2243  Code Status: Full code Family Communication: Updated patient's wife over the phone. Status is: Inpatient  Remains inpatient appropriate because:Hemodynamically unstable, Unsafe d/c plan, IV treatments appropriate due to intensity of illness or inability to take PO and Inpatient level of care appropriate due to severity of illness   Dispo:  Patient From: Home  Planned Disposition: Home  Expected discharge date: 08/04/20  Medically stable for discharge: No        Consultants:  PCCM General surgery Palliative medicine   Sch Meds:  Scheduled Meds: . ascorbic acid  500 mg Per Tube  BID  . baclofen  10 mg Per Tube TID  . bisacodyl  10 mg Rectal QHS  . chlorhexidine  15 mL Mouth Rinse BID  . Chlorhexidine Gluconate Cloth  6 each Topical Daily  . collagenase   Topical Daily  . enoxaparin (LOVENOX) injection  40 mg Subcutaneous Q24H  . famotidine  20 mg Per Tube Daily  . feeding supplement (OSMOLITE 1.5 CAL)  237 mL Per Tube Q3H  . feeding supplement (PROSource TF)  45 mL Per Tube BID  . ferrous sulfate  75 mg Per Tube Daily  . fludrocortisone  0.2 mg Per Tube Daily  . FLUoxetine  40 mg Per Tube Daily  . free water  400 mL Per Tube Q4H  . guaiFENesin  20 mL Per Tube Q6H  . lidocaine  1 patch Transdermal Q24H  . lidocaine  15 mL Mouth/Throat Once  . mouth rinse  15 mL Mouth Rinse q12n4p  . methylPREDNISolone (SOLU-MEDROL) injection  40 mg Intravenous Q12H  . midodrine  10 mg Per Tube TID WC  . multivitamin  5 mL Per Tube Daily  . nutrition supplement (JUVEN)  1 packet Per Tube BID BM  . polycarbophil  625 mg Per Tube Daily  . saccharomyces boulardii  250 mg Per Tube BID  . scopolamine  1 patch Transdermal Q72H  . Simethicone  80 mg Per Tube QID  . traZODone  50 mg Per Tube QHS   Continuous Infusions: . sodium chloride Stopped (07/25/20 2347)  . dextrose 5 % with KCl 20 mEq / L 20 mEq (08/03/20 1009)  . piperacillin-tazobactam (ZOSYN)  IV 3.375 g (08/03/20 0504)   PRN Meds:.acetaminophen, bisacodyl, hydrocortisone cream  Antimicrobials: Anti-infectives (From admission, onward)   Start     Dose/Rate Route Frequency Ordered Stop   08/01/20 1100  piperacillin-tazobactam (ZOSYN) IVPB 3.375 g        3.375 g 12.5 mL/hr over 240 Minutes Intravenous Every 8 hours 08/01/20 0915     07/26/20 1600  Ampicillin-Sulbactam (UNASYN) 3 g in sodium chloride 0.9 % 100 mL IVPB        3 g 200 mL/hr over 30 Minutes Intravenous Every 6 hours 07/26/20 1130 07/30/20 2200   07/26/20 1200  vancomycin (VANCOCIN) IVPB 1000 mg/200 mL premix  Status:  Discontinued        1,000 mg 200  mL/hr over 60 Minutes Intravenous Every 12 hours 07/25/20 2304 07/26/20 1106   07/25/20 2315  vancomycin (VANCOREADY) IVPB 2000 mg/400 mL       "Followed by" Linked Group Details   2,000 mg 200 mL/hr over 120 Minutes Intravenous  Once 07/25/20 2301 07/26/20 0205   07/25/20 2315  vancomycin (VANCOREADY) IVPB 500 mg/100 mL       "Followed by" Linked Group Details   500 mg 100 mL/hr over 60 Minutes Intravenous  Once 07/25/20 2301 07/26/20 0323   07/25/20 1500  piperacillin-tazobactam (ZOSYN) IVPB 3.375 g  Status:  Discontinued        3.375 g 12.5 mL/hr over 240 Minutes Intravenous Every 8 hours 07/25/20 1446 07/26/20 1129   07/25/20 1430  Ampicillin-Sulbactam (UNASYN) 3 g in sodium chloride 0.9 % 100 mL IVPB  Status:  Discontinued        3 g 200 mL/hr over 30 Minutes Intravenous  Once 07/25/20 1418 07/25/20 1446       I have personally reviewed the following labs and images: CBC: Recent Labs  Lab 07/29/20 0713 07/29/20 0713 07/30/20 0343 07/30/20 0343 07/31/20 0443 08/01/20 0002 08/01/20 0344 08/02/20 0634 08/03/20 0515  WBC 9.4   < > 8.9   < > 9.9 12.9* 12.5* 13.9* 11.5*  NEUTROABS 6.6  --  5.9  --   --   --   --  12.5* 10.1*  HGB 7.0*   < > 7.4*   < > 7.7* 7.5* 7.6* 8.4* 8.6*  HCT 22.8*   < > 24.3*   < > 25.2* 24.4* 24.4* 27.7* 28.1*  MCV 90.8   < > 89.7   < > 89.7 90.7 90.7 90.2 89.2  PLT 322   < > 315   < > 303 270 253 256 162   < > = values in this interval not displayed.   BMP &GFR Recent Labs  Lab 07/29/20 0713 07/30/20 0343 07/31/20 0443 08/01/20 0002 08/01/20 0344 08/02/20 0723 08/03/20 0515  NA 144   < > 144 148* 148* 146* 142  K 4.0   < > 4.0 3.6 3.4* 4.0 3.7  CL 108   < > 106 108 110 109 106  CO2 27   < > _0 GLUCOSE 117*   < > 112* 156* 208* 174* 208*  BUN 23   < > 25* 28* 27* 27* 29*  CREATININE 0.86   < > 0.88 0.93 0.95 0.70 0.72  CALCIUM 9.4   < > 9.5 9.2 9.3  9.5 9.7  MG 2.3  --   --  2.5*  --  2.5* 2.6*  PHOS  --   --   --   --   --   3.6 2.4*   < > = values in this interval not displayed.   Estimated Creatinine Clearance: 111.4 mL/min (by C-G formula based on SCr of 0.72 mg/dL). Liver & Pancreas: No results for input(s): AST, ALT, ALKPHOS, BILITOT, PROT, ALBUMIN in the last 168 hours. No results for input(s): LIPASE, AMYLASE in the last 168 hours. No results for input(s): AMMONIA in the last 168 hours. Diabetic: No results for input(s): HGBA1C in the last 72 hours. Recent Labs  Lab 08/02/20 0021  GLUCAP 129*   Cardiac Enzymes: No results for input(s): CKTOTAL, CKMB, CKMBINDEX, TROPONINI in the last 168 hours. No results for input(s): PROBNP in the last 8760 hours. Coagulation Profile: No results for input(s): INR, PROTIME in the last 168 hours. Thyroid Function Tests: No results for input(s): TSH, T4TOTAL, FREET4, T3FREE, THYROIDAB in the last 72 hours. Lipid Profile: No results for input(s): CHOL, HDL, LDLCALC, TRIG, CHOLHDL, LDLDIRECT in the last 72 hours. Anemia Panel: No results for input(s): VITAMINB12, FOLATE, FERRITIN, TIBC, IRON, RETICCTPCT in the last 72 hours. Urine analysis:    Component Value Date/Time   COLORURINE YELLOW 07/19/2020 1935   APPEARANCEUR CLOUDY (A) 07/19/2020 1935   APPEARANCEUR Clear 02/01/2020 0920   LABSPEC 1.012 07/19/2020 1935   PHURINE 5.0 07/19/2020 1935   GLUCOSEU NEGATIVE 07/19/2020 1935   HGBUR NEGATIVE 07/19/2020 1935   BILIRUBINUR NEGATIVE 07/19/2020 1935   BILIRUBINUR Negative 02/01/2020 0920   KETONESUR NEGATIVE 07/19/2020 1935   PROTEINUR NEGATIVE 07/19/2020 1935   NITRITE NEGATIVE 07/19/2020 1935   [place] (A) 07/19/2020 1935   Sepsis Labs: Invalid input(s): PROCALCITONIN, [place]  Microbiology: Recent Results (from the past 240 hour(s))  Respiratory Panel by RT PCR (Flu A&B, Covid) - Nasopharyngeal Swab     Status: None   Collection Time: 07/25/20  2:11 PM   Specimen: Nasopharyngeal Swab  Result Value Ref Range Status   SARS  Coronavirus 2 by RT PCR NEGATIVE NEGATIVE Final    Comment: (NOTE) SARS-CoV-2 target nucleic acids are NOT DETECTED.  The SARS-CoV-2 RNA is generally detectable in upper respiratoy specimens during the acute phase of infection. The lowest concentration of SARS-CoV-2 viral copies this assay can detect is 131 copies/mL. A negative result does not preclude SARS-Cov-2 infection and should not be used as the sole basis for treatment or other patient management decisions. A negative result may occur with  improper specimen collection/handling, submission of specimen other than nasopharyngeal swab, presence of viral mutation(s) within the areas targeted by this assay, and inadequate number of viral copies (<131 copies/mL). A negative result must be combined with clinical observations, patient history, and epidemiological information. The expected result is Negative.  Fact Sheet for Patients:  PinkCheek.be  Fact Sheet for Healthcare Providers:  GravelBags.it  This test is no t yet approved or cleared by the [place] FDA and  has been authorized for detection and/or diagnosis of SARS-CoV-2 by FDA under an Emergency Use Authorization (EUA). This EUA will remain  in effect (meaning this test can be used) for the duration of the COVID-19 declaration under Section 564(b)(1) of the Act, 21 U.S.C. section 360bbb-3(b)(1), unless the authorization is terminated or revoked sooner.     Influenza A by PCR NEGATIVE NEGATIVE Final   Influenza B by PCR NEGATIVE NEGATIVE Final  Comment: (NOTE) The Xpert Xpress SARS-CoV-2/FLU/RSV assay is intended as an aid in  the diagnosis of influenza from Nasopharyngeal swab specimens and  should not be used as a sole basis for treatment. Nasal washings and  aspirates are unacceptable for Xpert Xpress SARS-CoV-2/FLU/RSV  testing.  Fact Sheet for  Patients: PinkCheek.be  Fact Sheet for Healthcare Providers: GravelBags.it  This test is not yet approved or cleared by the [place] FDA and  has been authorized for detection and/or diagnosis of SARS-CoV-2 by  FDA under an Emergency Use Authorization (EUA). This EUA will remain  in effect (meaning this test can be used) for the duration of the  Covid-19 declaration under Section 564(b)(1) of the Act, 21  U.S.C. section 360bbb-3(b)(1), unless the authorization is  terminated or revoked. Performed at [facility], [address] [place], [place] [address]   Blood culture (routine x 2)     Status: None (Preliminary result)   Collection Time: 07/25/20  3:34 PM   Specimen: BLOOD  Result Value Ref Range Status   Specimen Description BLOOD BLOOD RIGHT FOREARM  Final   Special Requests   Final    BOTTLES DRAWN AEROBIC AND ANAEROBIC Blood Culture adequate volume   Culture   Final    NO GROWTH 4 DAYS Performed at [facility], [address] [place], [place] [address]    Report Status PENDING  Incomplete  Blood culture (routine x 2)     Status: None (Preliminary result)   Collection Time: 07/25/20  3:34 PM   Specimen: BLOOD  Result Value Ref Range Status   Specimen Description BLOOD BLOOD LEFT FOREARM  Final   Special Requests   Final    BOTTLES DRAWN AEROBIC AND ANAEROBIC Blood Culture adequate volume   Culture   Final    NO GROWTH 4 DAYS Performed at [facility], [address], [place], [place] [address]    Report Status PENDING  Incomplete  Culture, respiratory (non-expectorated)     Status: None   Collection Time: 07/25/20  6:36 PM   Specimen: Tracheal Aspirate; Respiratory  Result Value Ref Range Status   Specimen Description   Final    TRACHEAL ASPIRATE Performed at [facility], [address] [place], [place] [address]    Special Requests   Final     NONE Performed at [facility], [address] [place], [place] [address]    Gram Stain   Final    MODERATE WBC PRESENT,BOTH PMN AND MONONUCLEAR FEW GRAM POSITIVE COCCI IN CHAINS FEW GRAM POSITIVE RODS RARE GRAM NEGATIVE RODS    Culture   Final    FEW Normal respiratory flora-no Staph aureus or Pseudomonas seen Performed at [facility], [place] [address], [place], [place] [address]    Report Status 07/28/2020 FINAL  Final  MRSA PCR Screening     Status: None   Collection Time: 07/26/20  4:51 AM   Specimen: Nasopharyngeal  Result Value Ref Range Status   MRSA by PCR NEGATIVE NEGATIVE Final    Comment:        The GeneXpert MRSA Assay (FDA approved for NASAL specimens only), is one component of a comprehensive MRSA colonization surveillance program. It is not intended to diagnose MRSA infection nor to guide or monitor treatment for MRSA infections. Performed at [facility], [address] [place], [place] [address]   Culture, bal-quantitative     Status: None   Collection Time: 07/31/20  7:40  PM   Specimen: Bronchoalveolar Lavage; Respiratory  Result Value Ref Range Status   Specimen Description   Final    BRONCHIAL ALVEOLAR LAVAGE Performed at [facility], [address] [place], [place] [address]    Special Requests NONE  Final   Gram Stain   Final    FEW WBC PRESENT, PREDOMINANTLY PMN NO ORGANISMS SEEN    Culture   Final    NO GROWTH 2 DAYS Performed at [facility], [place] [address] [place], [place] [address]    Report Status 08/03/2020 FINAL  Final    Radiology Studies: DG Chest Port 1 View  Result Date: 08/03/2020 CLINICAL DATA:  Shortness of breath EXAM: PORTABLE CHEST 1 VIEW COMPARISON:  Yesterday FINDINGS: Tracheostomy tube in place. Collapse of the right lower lobe and possibly middle lobe with obscured diaphragm. Hazy density elsewhere is improved. No visible effusion or pneumothorax. High-density in the left  upper quadrant which is enteric. IMPRESSION: Interval lobar collapse at the right base. Electronically Signed   By: Monte Fantasia M.D.   On: 08/03/2020 05:21      Taeko Schaffer T. Cromwell  If 7PM-7AM, please contact night-coverage [email or web address] 08/03/2020, 2:16 PM

## 2020-08-03 NOTE — Progress Notes (Signed)
CRITICAL CARE NOTE  CC  follow up respiratory failure  SUBJECTIVE Patient remains critically ill Prognosis is guarded  SIGNIFICANT EVENTS  11/2> Admitted to stepdown unit under hospitalist service with aspiration pneumonia and hypotension.  11/3>Transferred to the floor 11/8>Transferred back to stepdown.  PCCM consulted for possible mucus plugging of RLL. 11/8> s/p bedside bronchoscopy 11/9> Mucus plugging again noted on chest xray. Planning on postural drainange 11/11- CXR with RLL collapse despite previous attempts at therapy.  Discussed with family , plan for bronchoscopy in am.    STUDIES:  11/8> Chest xray New infiltrate, volume loss and consolidation in the mid and lower lung on the right. 11/9> Repeat Chest xrayProgressive airspace disease and volume loss on the right compatible with partial collapse of the right lung. Right bronchial cut off suggesting mucous plugging   MICROBIOLOGY: 11/2 BCx: No growth 11/2 UCx:  11/2 Sputum: Gram positive cocci and Gram negative rods 11/2 MRSA PCR: Negative    ANTIBIOTICS: 11/2> Unasyn > completed 11/7  REVIEW OF SYSTEMS  PATIENT IS UNABLE TO PROVIDE COMPLETE REVIEW OF SYSTEMS DUE TO SEVERE CRITICAL ILLNESS   BP (!) 103/56   Pulse 93   Temp 98.7 F (37.1 C) (Axillary)   Resp (!) 30   Ht _0  (1.956 m)   Wt 108 kg   SpO2 90%   BMI 28.23 kg/m   I/O last 3 completed shifts: In: 400 [NG/GT:400] Out: 3150 [Urine:3150] Total I/O In: -  Out: 400 [Urine:400]  SpO2: 90 % O2 Flow Rate (L/min): 10 L/min FiO2 (%): 35 %  Estimated body mass index is 28.23 kg/m as calculated from the following:  Height as of this encounter: _1  (1.956 m).  Weight as of this encounter: 108 kg.  Pressure Injury 07/26/20 Sacrum Unstageable - Full thickness tissue loss in which the base of the injury is covered by slough (yellow, tan, gray, green or brown) and/or eschar (tan, brown or black) in the wound bed. (Active)  07/26/20 0513   Location: Sacrum  Location Orientation:   Staging: Unstageable - Full thickness tissue loss in which the base of the injury is covered by slough (yellow, tan, gray, green or brown) and/or eschar (tan, brown or black) in the wound bed.  Wound Description (Comments):   Present on Admission: Yes    PHYSICAL EXAMINATION: GENERAL:  69 y.o.-year-old patient lying in the bed with no acute distress. Sitting up in bed. Trach capped EYES: Pupils equal, round, reactive to light and accommodation. No scleral icterus. Extraocular muscles intact.  HEENT: Head atraumatic, normocephalic. Oropharynx and nasopharynx clear.  NECK:  Supple, no jugular venous distention. No thyroid enlargement, no tenderness. + Capped Trach LUNGS: Normal breath sounds bilaterally, no wheezing, Positive for  rales,rhonchi or crepitation. No use of accessory muscles of respiration.  CARDIOVASCULAR: S1, S2 normal. No murmurs, rubs, or gallops.  ABDOMEN: Soft, nontender, nondistended. Bowel sounds present. No organomegaly or mass.  EXTREMITIES: Generalized edema. Dependent edema R>L, cyanosis, or clubbing.  NEUROLOGIC: Cranial nerved grossly intact except as noted otherwise.. Sensation diminished in all extremities.. Gait not checked. Dysphonic speech, B/l UE shoulder abduction 1+/5, distally 0/5 with ?trace flicker left wrist flexor. B/l LE: 0/5 proximal to distal PSYCHIATRIC: The patient is alert and oriented x 3.  SKIN: Sacral ulcer present per wound care. Not assessed.  MEDICATIONS: I have reviewed all medications and confirmed regimen as documented Scheduled Meds: . ascorbic acid  500 mg Per Tube BID  . baclofen  10 mg Per Tube  TID  . bisacodyl  10 mg Rectal QHS  . chlorhexidine  15 mL Mouth Rinse BID  . Chlorhexidine Gluconate Cloth  6 each Topical Daily  . collagenase   Topical Daily  . enoxaparin (LOVENOX) injection  40 mg Subcutaneous Q24H  . famotidine  20 mg Per Tube Daily  . feeding supplement (OSMOLITE 1.5 CAL)   237 mL Per Tube Q3H  . feeding supplement (PROSource TF)  45 mL Per Tube BID  . ferrous sulfate  75 mg Per Tube Daily  . fludrocortisone  0.2 mg Per Tube Daily  . FLUoxetine  40 mg Per Tube Daily  . free water  400 mL Per Tube Q4H  . guaiFENesin  20 mL Per Tube Q6H  . lidocaine  1 patch Transdermal Q24H  . lidocaine  15 mL Mouth/Throat Once  . mouth rinse  15 mL Mouth Rinse q12n4p  . methylPREDNISolone (SOLU-MEDROL) injection  40 mg Intravenous Q12H  . midodrine  10 mg Per Tube TID WC  . multivitamin  5 mL Per Tube Daily  . nutrition supplement (JUVEN)  1 packet Per Tube BID BM  . polycarbophil  625 mg Per Tube Daily  . saccharomyces boulardii  250 mg Per Tube BID  . scopolamine  1 patch Transdermal Q72H  . Simethicone  80 mg Per Tube QID  . traZODone  50 mg Per Tube QHS   Continuous Infusions: . sodium chloride Stopped (07/25/20 2347)  . dextrose 5 % with KCl 20 mEq / L 20 mEq (08/02/20 2140)  . piperacillin-tazobactam (ZOSYN)  IV 3.375 g (08/03/20 0504)   PRN Meds:.acetaminophen, bisacodyl, hydrocortisone cream  CULTURE RESULTS   Recent Results (from the past 240 hour(s))  Respiratory Panel by RT PCR (Flu A&B, Covid) - Nasopharyngeal Swab     Status: None   Collection Time: 07/25/20  2:11 PM   Specimen: Nasopharyngeal Swab  Result Value Ref Range Status   SARS Coronavirus 2 by RT PCR NEGATIVE NEGATIVE Final    Comment: (NOTE) SARS-CoV-2 target nucleic acids are NOT DETECTED.  The SARS-CoV-2 RNA is generally detectable in upper respiratoy specimens during the acute phase of infection. The lowest concentration of SARS-CoV-2 viral copies this assay can detect is 131 copies/mL. A negative result does not preclude SARS-Cov-2 infection and should not be used as the sole basis for treatment or other patient management decisions. A negative result may occur with  improper specimen collection/handling, submission of specimen other than nasopharyngeal swab, presence of viral  mutation(s) within the areas targeted by this assay, and inadequate number of viral copies (<131 copies/mL). A negative result must be combined with clinical observations, patient history, and epidemiological information. The expected result is Negative.  Fact Sheet for Patients:  PinkCheek.be  Fact Sheet for Healthcare Providers:  GravelBags.it  This test is no t yet approved or cleared by the [place] FDA and  has been authorized for detection and/or diagnosis of SARS-CoV-2 by FDA under an Emergency Use Authorization (EUA). This EUA will remain  in effect (meaning this test can be used) for the duration of the COVID-19 declaration under Section 564(b)(1) of the Act, 21 U.S.C. section 360bbb-3(b)(1), unless the authorization is terminated or revoked sooner.     Influenza A by PCR NEGATIVE NEGATIVE Final   Influenza B by PCR NEGATIVE NEGATIVE Final    Comment: (NOTE) The Xpert Xpress SARS-CoV-2/FLU/RSV assay is intended as an aid in  the diagnosis of influenza from Nasopharyngeal swab specimens and  should not  be used as a sole basis for treatment. Nasal washings and  aspirates are unacceptable for Xpert Xpress SARS-CoV-2/FLU/RSV  testing.  Fact Sheet for Patients: PinkCheek.be  Fact Sheet for Healthcare Providers: GravelBags.it  This test is not yet approved or cleared by the [place] FDA and  has been authorized for detection and/or diagnosis of SARS-CoV-2 by  FDA under an Emergency Use Authorization (EUA). This EUA will remain  in effect (meaning this test can be used) for the duration of the  Covid-19 declaration under Section 564(b)(1) of the Act, 21  U.S.C. section 360bbb-3(b)(1), unless the authorization is  terminated or revoked. Performed at [facility], [address] [place], [place] [address]   Blood culture (routine x 2)      Status: None (Preliminary result)   Collection Time: 07/25/20  3:34 PM   Specimen: BLOOD  Result Value Ref Range Status   Specimen Description BLOOD BLOOD RIGHT FOREARM  Final   Special Requests   Final    BOTTLES DRAWN AEROBIC AND ANAEROBIC Blood Culture adequate volume   Culture   Final    NO GROWTH 4 DAYS Performed at [facility], [address], [place], [place] [address]    Report Status PENDING  Incomplete  Blood culture (routine x 2)     Status: None (Preliminary result)   Collection Time: 07/25/20  3:34 PM   Specimen: BLOOD  Result Value Ref Range Status   Specimen Description BLOOD BLOOD LEFT FOREARM  Final   Special Requests   Final    BOTTLES DRAWN AEROBIC AND ANAEROBIC Blood Culture adequate volume   Culture   Final    NO GROWTH 4 DAYS Performed at [facility], [address], [place], [place] [address]    Report Status PENDING  Incomplete  Culture, respiratory (non-expectorated)     Status: None   Collection Time: 07/25/20  6:36 PM   Specimen: Tracheal Aspirate; Respiratory  Result Value Ref Range Status   Specimen Description   Final    TRACHEAL ASPIRATE Performed at [facility], [address] [place], [place] [address]    Special Requests   Final    NONE Performed at [facility], [address] [place], [place] [address]    Gram Stain   Final    MODERATE WBC PRESENT,BOTH PMN AND MONONUCLEAR FEW GRAM POSITIVE COCCI IN CHAINS FEW GRAM POSITIVE RODS RARE GRAM NEGATIVE RODS    Culture   Final    FEW Normal respiratory flora-no Staph aureus or Pseudomonas seen Performed at [facility], [place] [address], [place], [place] [address]    Report Status 07/28/2020 FINAL  Final  MRSA PCR Screening     Status: None   Collection Time: 07/26/20  4:51 AM   Specimen: Nasopharyngeal  Result Value Ref Range Status   MRSA by PCR NEGATIVE NEGATIVE Final    Comment:        The GeneXpert MRSA Assay (FDA approved for  NASAL specimens only), is one component of a comprehensive MRSA colonization surveillance program. It is not intended to diagnose MRSA infection nor to guide or monitor treatment for MRSA infections. Performed at [facility], [address] [place], [place] [address]   Culture, bal-quantitative     Status: None (Preliminary result)   Collection Time: 07/31/20  7:40 PM   Specimen: Bronchoalveolar Lavage; Respiratory  Result Value Ref Range Status   Specimen Description   Final    BRONCHIAL  ALVEOLAR LAVAGE Performed at [facility], [address] [place], [place] [address]    Special Requests NONE  Final   Gram Stain   Final    FEW WBC PRESENT, PREDOMINANTLY PMN NO ORGANISMS SEEN    Culture   Final    NO GROWTH 2 DAYS Performed at [facility], [place] [address], [place], [place] [address]    Report Status PENDING  Incomplete    CBC    Component Value Date/Time   WBC 11.5 (H) 08/03/2020 0515   RBC 3.15 (L) 08/03/2020 0515   HGB 8.6 (L) 08/03/2020 0515   HGB 14.7 02/01/2020 0927   HCT 28.1 (L) 08/03/2020 0515   HCT 44.1 02/01/2020 0927   PLT 162 08/03/2020 0515   PLT 267 02/01/2020 0927   MCV 89.2 08/03/2020 0515   MCV 96 02/01/2020 0927   MCH 27.3 08/03/2020 0515   MCHC 30.6 08/03/2020 0515   RDW 16.5 (H) 08/03/2020 0515   RDW 12.3 02/01/2020 0927   LYMPHSABS 0.8 08/03/2020 0515   LYMPHSABS 1.5 02/01/2020 0927   MONOABS 0.4 08/03/2020 0515   EOSABS 0.1 08/03/2020 0515   EOSABS 0.1 02/01/2020 0927   BASOSABS 0.0 08/03/2020 0515   BASOSABS 0.0 02/01/2020 0927   CMP Latest Ref Rng & Units 08/03/2020 08/02/2020 08/01/2020  Glucose 70 - 99 mg/dL 208(H) 174(H) 208(H)  BUN 8 - 23 mg/dL 29(H) 27(H) 27(H)  Creatinine 0.61 - 1.24 mg/dL 0.72 0.70 0.95  Sodium 135 - 145 mmol/L 142 146(H) 148(H)  Potassium 3.5 - 5.1 mmol/L 3.7 4.0 3.4(L)  Chloride 98 - 111 mmol/L 106 109 110  CO2 22 - 32 mmol/L _0 Calcium 8.9 - 10.3 mg/dL 9.7 9.5 9.3   Total Protein 6.5 - 8.1 g/dL - - -  Total Bilirubin 0.3 - 1.2 mg/dL - - -  Alkaline Phos 38 - 126 U/L - - -  AST 15 - 41 U/L - - -  ALT 0 - 44 U/L - - -      IMAGING    DG Chest Port 1 View  Result Date: 08/03/2020 CLINICAL DATA:  Shortness of breath EXAM: PORTABLE CHEST 1 VIEW COMPARISON:  Yesterday FINDINGS: Tracheostomy tube in place. Collapse of the right lower lobe and possibly middle lobe with obscured diaphragm. Hazy density elsewhere is improved. No visible effusion or pneumothorax. High-density in the left upper quadrant which is enteric. IMPRESSION: Interval lobar collapse at the right base. Electronically Signed   By: Monte Fantasia M.D.   On: 08/03/2020 05:21   DG UGI W SINGLE CM (SOL OR THIN BA)  Result Date: 08/02/2020 CLINICAL DATA:  Evaluate for gastroesophageal reflux. Aspiration pneumonia. EXAM: UPPER GI SERIES WITHOUT KUB TECHNIQUE: Routine upper GI series was performed with thin and thick barium. FLUOROSCOPY TIME:  Fluoroscopy Time:  0.8 minute Radiation Exposure Index (if provided by the fluoroscopic device): 8.9 mGy Number of Acquired Spot Images: 0 COMPARISON:  None. FINDINGS: Peg tube is present projecting over the stomach. 100 mL of thin barium was hand injected through the PEG tube into the stomach. The contrast opacifies the stomach. There is emptying of contrast into the proximal small bowel. During the period of observation, there was no gastroesophageal reflux. IMPRESSION: No evidence of gastroesophageal reflux. Electronically Signed   By: Kathreen Devoid   On: 08/02/2020 13:43     Nutrition Status: Nutrition Problem: Inadequate oral intake Etiology: inability to eat Signs/Symptoms: NPO status (reliance on tube feeds and  free water per G-tube to meet calorie/protein/hydration needs.) Interventions: Tube feeding, Prostat, MVI    Indwelling Urinary Catheter continued, requirement due to   Reason to continue Indwelling Urinary Catheter strict Intake/Output  monitoring for hemodynamic instability   Central Line/ continued, requirement due to  Reason to continue [name] of central venous pressure or other hemodynamic parameters and poor IV access   .SpO2: 100 % O2 Flow Rate (L/min): 10 L/min FiO2 (%): 40 %   ASSESSMENT / PLAN:  69 y.o male with significant PMH of HTN, acute on chronic hypoxic respiratory failure, AKI due to hypovolemia, C5 vertebral body fracture, C3-C6 cord compression with edema s/p C3-C6 laminectomy with fusion and repair of dural tear  (04/26/20), spinal shock, post procedural PTX,r ecurrent aspiration pneumonia with mucus plugging s/p PEG/TRACH 05/04/20, and PEA Cardiac arrest with ROSC admitted on 07/25/20 with aspiration pneumonia. Now with worsening hypoxia, dyspnea and tachypnea on exam. Repeat chest Xray concerning for mucus plugging s/p bronchoscopy.    Acute on Chronic Hypoxic Respiratory Failure secondary to recurrent aspiration pneumonia, possible mucus plug on RLL S/p trach placement 05/04/20 S/p Bronchoscopy 11/8. Now with recurrent plugging again noted on this am chest xray -Supplemental O2 as needed to maintain O2 saturations 88 to 92% via trach collar -Follow intermittent ABG and chest x-ray as needed -As needed bronchodilators -Aggressive pulmonary hygiene -suctioning PRN with trach care -Aspiration precautions -Plan postural drainage   Aspiration pneumonia Completed 7 day course of treatment with Unasyn. Spike a temp on 11/6 while on abx -Monitor fever curve -Monitor white count -Follow cultures   Episodic neurogenic hypotension Likely 2/2 spinal cord injury. Appears hemodynamically stable -Continue Midodrine and Florinef as scheduled -SCDs for venous return   Dysphagia s/s SCI S/p PEG placement 8/4 -Hold TF due to aspiration -Aspiration precautions -IVFs hydration while NPO     Iron Deficiency Anemia with baseline hgb 8.0 S/p Venofer infusion 11/6 -Continue iron  supplement -Transfuse for hgb<7.0    Transaminitis - resolved Patient noted to have mild elevated LFTs, appears to have been elevated prior. 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 per WOC recommendations       Best practice:  Diet: NPO Pain/Anxiety/Delirium protocol (if indicated): N/A VAP protocol (if indicated): N/A DVT prophylaxis: Lovenox SQ GI prophylaxis: Famotidine Glucose control: N/A Mobility: Quadriplegic Code Status: Full Code Family Communication: Updated patient's wife who is at the bedside    Critical care provider statement:    Critical care time (minutes):  33   Critical care time was exclusive of:  Separately billable procedures and  treating other patients   Critical care was necessary to treat or prevent imminent or  life-threatening deterioration of the following conditions:  aspiration pneumonia, mucus plugging of airways, pneumonia   Critical care was time spent personally by me on the following  activities:  Development of treatment plan with patient or surrogate,  discussions with consultants, evaluation of patient's response to  treatment, examination of patient, obtaining history from patient or  surrogate, ordering and performing treatments and interventions, ordering  and review of laboratory studies  and re-evaluation of patient's condition   I assumed direction of critical care for this patient from another  provider in my specialty: no      Ottie Glazier, M.D.  [facility] [place]

## 2020-08-04 ENCOUNTER — Inpatient Hospital Stay: Payer: BC Managed Care – PPO

## 2020-08-04 DIAGNOSIS — J9811 Atelectasis: Secondary | ICD-10-CM

## 2020-08-04 LAB — PHOSPHORUS
Phosphorus: 1.3 mg/dL — ABNORMAL LOW (ref 2.5–4.6)
Phosphorus: 1.6 mg/dL — ABNORMAL LOW (ref 2.5–4.6)

## 2020-08-04 LAB — POTASSIUM
Potassium: 3.6 mmol/L (ref 3.5–5.1)
Potassium: 3.8 mmol/L (ref 3.5–5.1)

## 2020-08-04 LAB — MAGNESIUM: Magnesium: 2.3 mg/dL (ref 1.7–2.4)

## 2020-08-04 IMAGING — DX DG CHEST 1V PORT
1 series · 1 of 1 positions shown · non-contrast
Comparison: [DATE]

CLINICAL DATA: Shortness of breath

EXAM:
PORTABLE CHEST 1 VIEW

[chest ap]
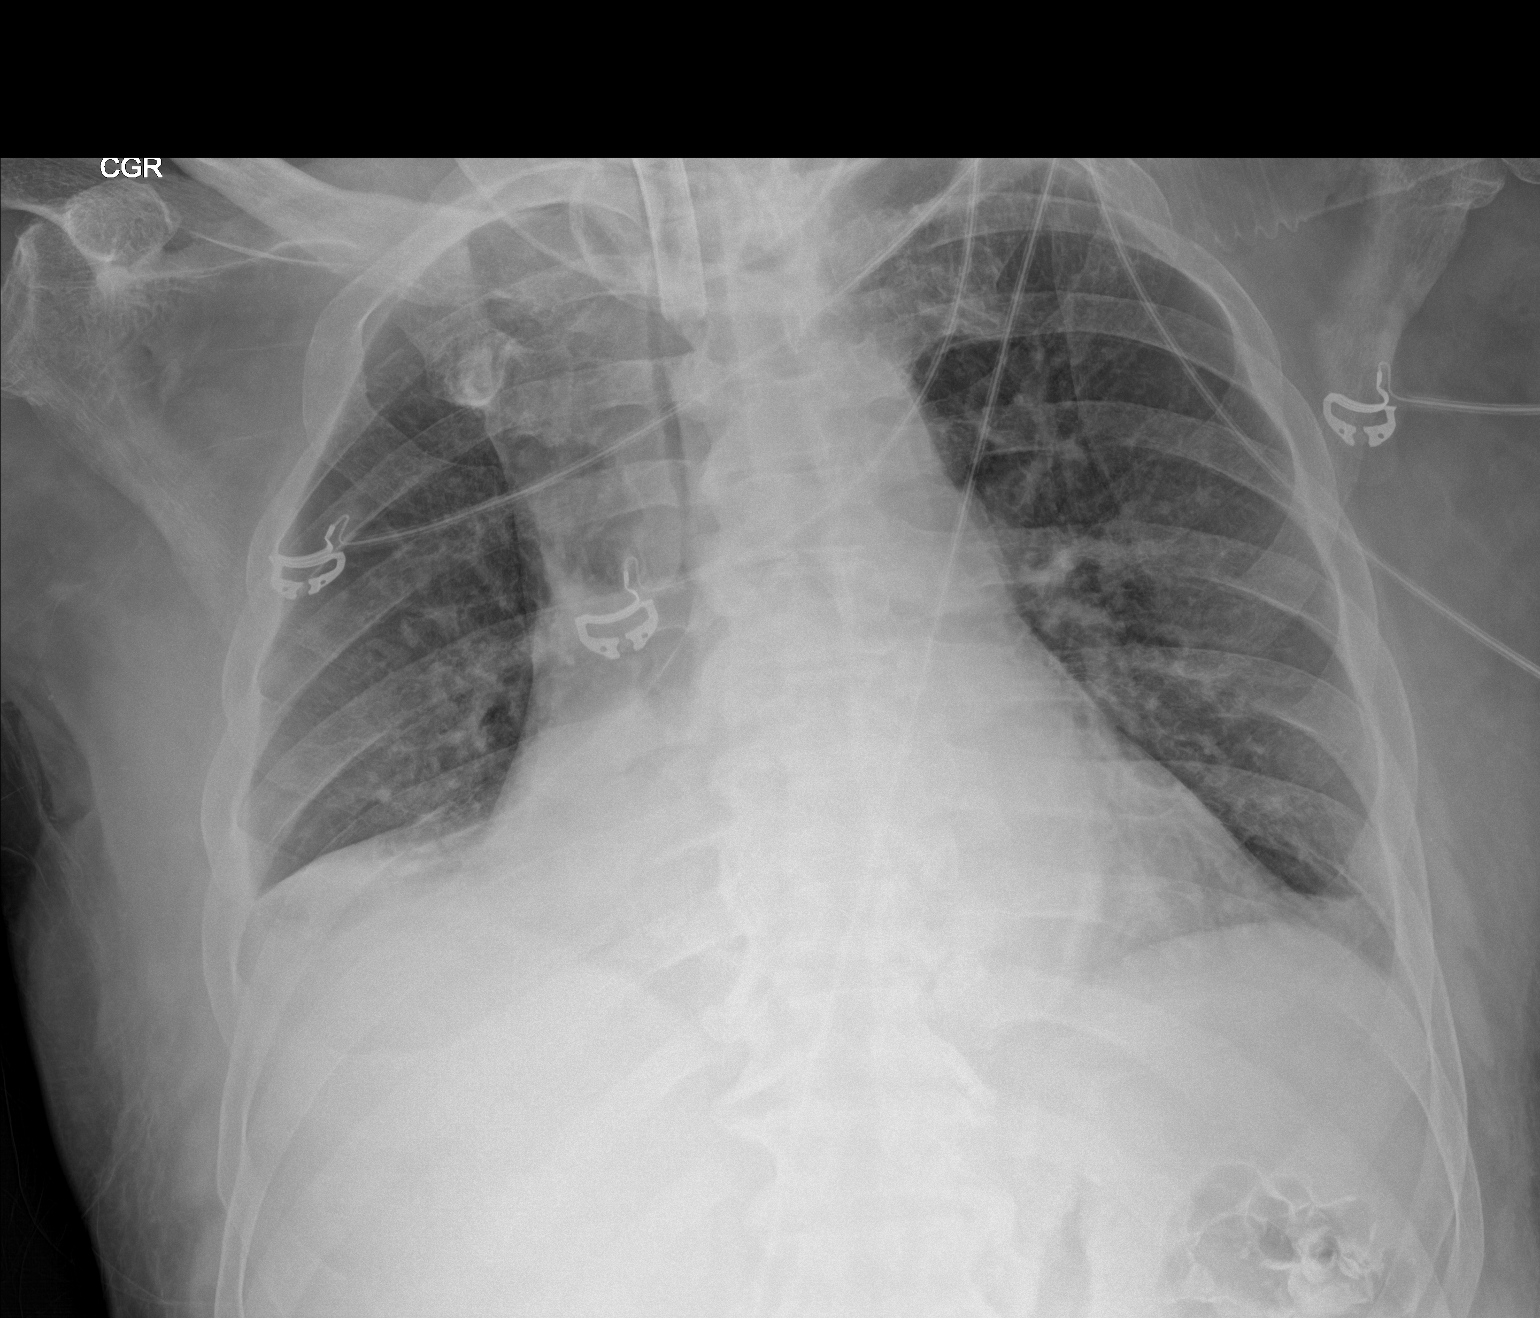

[1 of 1 positions shown; findings below may reference images not displayed]

FINDINGS: Tracheostomy catheter tip is 7.1 cm above the carina. No
pneumothorax. Apparent pneumomediastinum immediately to the right of
the trachea. There is persistent consolidation with volume loss and
portions of the right middle and lower lobes. There is slight left
base atelectasis. Heart is prominent with pulmonary vascularity
normal. No adenopathy. No bone lesions.
IMPRESSION: Tracheostomy as described without pneumothorax. Apparent
pneumomediastinum immediately to the right of the trachea of
uncertain etiology. Persistent significant volume loss right middle
and lower lobe regions with consolidation in these areas. Mild left
base atelectasis. No new opacity evident. Stable cardiac silhouette.

These results will be called to the ordering clinician or
representative by the Radiologist Assistant, and communication
documented in the PACS or [REDACTED].

## 2020-08-04 MED ORDER — MIDAZOLAM HCL 2 MG/2ML IJ SOLN
INTRAMUSCULAR | Status: AC
  Filled 2020-08-04: qty 2

## 2020-08-04 MED ORDER — MIDAZOLAM HCL 2 MG/2ML IJ SOLN: 8.0000 mg | Freq: Once | INTRAMUSCULAR | Status: AC

## 2020-08-04 MED ORDER — MIDAZOLAM HCL 2 MG/2ML IJ SOLN
INTRAMUSCULAR | Status: AC
  Administered 2020-08-04: 8 mg via INTRAVENOUS
  Filled 2020-08-04: qty 4

## 2020-08-04 MED ORDER — GLYCOPYRROLATE 1 MG PO TABS
1.0000 mg | ORAL_TABLET | Freq: Three times a day (TID) | ORAL | Status: DC
  Administered 2020-08-04 – 2020-08-08 (×12): 1 mg
  Filled 2020-08-04 (×14): qty 1

## 2020-08-04 MED ORDER — FENTANYL CITRATE (PF) 100 MCG/2ML IJ SOLN
100.0000 ug | Freq: Once | INTRAMUSCULAR | Status: AC
  Administered 2020-08-04: 100 ug via INTRAVENOUS

## 2020-08-04 MED ORDER — POTASSIUM PHOSPHATES 15 MMOLE/5ML IV SOLN
45.0000 mmol | Freq: Once | INTRAVENOUS | Status: AC
  Administered 2020-08-04: 45 mmol via INTRAVENOUS
  Filled 2020-08-04: qty 15

## 2020-08-04 MED ORDER — METHYLPREDNISOLONE SODIUM SUCC 40 MG IJ SOLR
20.0000 mg | Freq: Two times a day (BID) | INTRAMUSCULAR | Status: DC
  Administered 2020-08-04 – 2020-08-07 (×6): 20 mg via INTRAVENOUS
  Filled 2020-08-04 (×6): qty 1

## 2020-08-04 NOTE — Progress Notes (Signed)
PROGRESS NOTE  Ryan Day QIH:[id number] DOB: June 08, 1951   PCP: Valerie Roys, DO  Patient is from: Home  DOA: 07/25/2020 LOS: 64  Chief complaints: Shortness of breath  Brief Narrative / Interim history: 69 year old male with quadriplegia s/p fall in August 2021, trach and PEG dependence presenting with shortness of breath, cough with copious amount of sputum, and admitted for acute on chronic respiratory failure with hypoxia due to aspiration pneumonia and intermittent mucous plugging.   Patient continued to have intermittent tachypnea with right mucous plugging after a course of IV antibiotic for aspiration pneumonia.  He also spiked fever and restarted on Zosyn.  BAL culture NGTD.  On 11/11, CXR with RLL collapse.  CCM planning bronchoscopy.  Subjective: Seen and examined earlier this morning.  No major events overnight of this morning.  No complaints.  He notes no to pain, shortness of breath or GI symptoms.  Objective: Vitals:   08/04/20 0900 08/04/20 1000 08/04/20 1100 08/04/20 1200  BP: 117/68 131/63 135/74 (!) 151/82  Pulse: 68 64 (!) 58 (!) 58  Resp: (!) 30 (!) 27 (!) 29 (!) 22  Temp:      TempSrc:      SpO2: 100% 100% 100% 100%  Weight:      Height:        Intake/Output Summary (Last 24 hours) at 08/04/2020 1315 Last data filed at 08/04/2020 1200 Gross per 24 hour  Intake 2728.66 ml  Output 3350 ml  Net -621.34 ml   Filed Weights   07/25/20 1300 08/03/20 0500 08/04/20 0346  Weight: 108 kg 100.1 kg 107.8 kg    Examination:  GENERAL: No apparent distress.  Nontoxic. HEENT: MMM.  Vision and hearing grossly intact.  NECK: Tracheostomy in place.  On 10 L / 40% FiO2. RESP: On trach as above.  No IWOB.  Fair aeration bilaterally. CVS: Irregular rhythm due to PACs. Heart sounds normal.  ABD/GI/GU: BS+. Abd soft, NTND.  PEG tube and rectal tube in place. MSK/EXT: Quadriplegia. SKIN: no apparent skin lesion or wound NEURO: Awake and alert.  CN grossly  intact.  Quadriplegia. PSYCH: Calm. Normal affect.   Procedures:  11/8-bronchoscopy  Microbiology summarized: Limited RVP panel nonreactive. MRSA PCR negative. 11/2-Rattray culture with normal flora 11/2-blood cultures NGTD. 11/8-BAL NGTD.  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  Zosyn 11/9-11/12. Leukocytosis and procalcitonin improving.  Culture data as above. 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  Leukocytosis could be due to steroid. -CXR on 11/11 shows interval lobar collapse at the right base.  PCCM planning bronchoscopy. -General surgery- "will leave current trach in place.  If he deteriorates, he may need emergent ventilatory support in which case a cuffed trach or endotracheal tube will need to be traded out ".  -Wean oxygen as able -Continue bronchodilators, pulmonary hygiene, aspiration precaution and chest physiotherapy -IV Solu-Medrol per PCCM.  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 -PT/OT  Dysphagia: Has PEG tube.  Patient had normal PEG tube barium study by PCCM -Continue bolus tube feeds with free water -Aspiration precautions  Hypernatremia: Resolved. -Continue free water flushes at 400 cc every 4 hours  Hypophosphatemia:  K1.3. -Repleted with potassium phosphate 45 mmol  Bradycardia/hypotension: Episodic likely neurogenic from spinal cord injury.  Normotensive this morning. -Per PCCM, cardiology recommended considering anticoagulation mainly due to risk for DVT/PE. -Continue midodrine and Florinef  Iron deficiency anemia: Hgb 8.6.  (stable). -Received Venofer infusion 11/6 and 11/8 -Continue monitoring.  Hypokalemia: Resolved.  Leukocytosis/bandemia: WBC 14>11. Infectious?  Demargination?  Improving. -Continue monitoring.  Goal of care: Overall, grim prognosis.  Palliative has been involved earler in the course. Still full code.  -Reconsult palliative care  Nutrition: Currently n.p.o. due to aspiration risk -Continue gentle IV fluid Body mass index is 28.18 kg/m. Nutrition Problem: Inadequate oral intake Etiology: inability to eat Signs/Symptoms: NPO status (reliance on tube feeds and free water per G-tube to meet calorie/protein/hydration needs.) Interventions: Tube feeding, Prostat, MVI   Sacral decubitus ulcer, stage II-POA.  Due to quadriplegia.   -Reposition every 2 hours -WOCN following. Pressure Injury 07/26/20 Sacrum Unstageable - Full thickness tissue loss in which the base of the injury is covered by slough (yellow, tan, gray, green or brown) and/or eschar (tan, brown or black) in the wound bed. (Active)  07/26/20 0513  Location: Sacrum  Location Orientation:   Staging: Unstageable - Full thickness tissue loss in which the base of the injury is covered by slough (yellow, tan, gray, green or brown) and/or eschar (tan, brown or black) in the wound bed.  Wound Description (Comments):   Present on Admission: Yes   DVT prophylaxis:  enoxaparin (LOVENOX) injection 40 mg Start: 07/26/20 1000 SCDs Start: 07/25/20 2243  Code Status: Full code Family Communication: Updated patient's wife over the phone on 11/11. Status is: Inpatient  Remains inpatient appropriate because:Hemodynamically unstable, Unsafe d/c plan, IV treatments appropriate due to intensity of illness or inability to take PO and Inpatient level of care appropriate due to severity of illness   Dispo:  Patient From: Home  Planned Disposition: Home  Expected discharge date: 08/04/20  Medically stable for discharge:  No        Consultants:  PCCM General surgery Palliative medicine   Sch Meds:  Scheduled Meds:  ascorbic acid  500 mg Per Tube BID   baclofen  10 mg Per Tube TID   bisacodyl  10 mg Rectal QHS   chlorhexidine  15 mL Mouth Rinse BID   Chlorhexidine Gluconate Cloth  6 each Topical Daily   collagenase   Topical Daily   enoxaparin (LOVENOX) injection  40 mg Subcutaneous Q24H   famotidine  20 mg Per Tube Daily   feeding supplement (OSMOLITE 1.5 CAL)  237 mL Per Tube Q3H   feeding supplement (PROSource TF)  45 mL Per Tube BID   ferrous sulfate  75 mg Per Tube Daily   fludrocortisone  0.2 mg Per Tube Daily   FLUoxetine  40 mg Per Tube Daily   free water  400 mL Per Tube Q4H   glycopyrrolate  1 mg Per Tube TID   guaiFENesin  20 mL Per Tube Q6H   lidocaine  1 patch Transdermal Q24H   lidocaine  15 mL Mouth/Throat Once   mouth rinse  15 mL Mouth Rinse q12n4p   methylPREDNISolone (SOLU-MEDROL) injection  20 mg Intravenous Q12H   midodrine  10 mg Per Tube TID WC   multivitamin  5 mL Per Tube Daily   nutrition supplement (JUVEN)  1 packet Per Tube BID BM   polycarbophil  625 mg Per Tube Daily   saccharomyces boulardii  250 mg Per Tube BID  scopolamine  1 patch Transdermal Q72H   Simethicone  80 mg Per Tube QID   traZODone  50 mg Per Tube QHS   Continuous Infusions:  sodium chloride Stopped (07/25/20 2347)   dextrose 5 % with KCl 20 mEq / L Stopped (08/04/20 0808)   potassium PHOSPHATE IVPB (in mmol) 86 mL/hr at 08/04/20 1200   PRN Meds:.acetaminophen, bisacodyl, hydrocortisone cream  Antimicrobials: Anti-infectives (From admission, onward)   Start     Dose/Rate Route Frequency Ordered Stop   08/01/20 1100  piperacillin-tazobactam (ZOSYN) IVPB 3.375 g  Status:  Discontinued        3.375 g 12.5 mL/hr over 240 Minutes Intravenous Every 8 hours 08/01/20 0915 08/04/20 1024   07/26/20 1600  Ampicillin-Sulbactam (UNASYN) 3 g in sodium chloride 0.9 %  100 mL IVPB        3 g 200 mL/hr over 30 Minutes Intravenous Every 6 hours 07/26/20 1130 07/30/20 2200   07/26/20 1200  vancomycin (VANCOCIN) IVPB 1000 mg/200 mL premix  Status:  Discontinued        1,000 mg 200 mL/hr over 60 Minutes Intravenous Every 12 hours 07/25/20 2304 07/26/20 1106   07/25/20 2315  vancomycin (VANCOREADY) IVPB 2000 mg/400 mL       "Followed by" Linked Group Details   2,000 mg 200 mL/hr over 120 Minutes Intravenous  Once 07/25/20 2301 07/26/20 0205   07/25/20 2315  vancomycin (VANCOREADY) IVPB 500 mg/100 mL       "Followed by" Linked Group Details   500 mg 100 mL/hr over 60 Minutes Intravenous  Once 07/25/20 2301 07/26/20 0323   07/25/20 1500  piperacillin-tazobactam (ZOSYN) IVPB 3.375 g  Status:  Discontinued        3.375 g 12.5 mL/hr over 240 Minutes Intravenous Every 8 hours 07/25/20 1446 07/26/20 1129   07/25/20 1430  Ampicillin-Sulbactam (UNASYN) 3 g in sodium chloride 0.9 % 100 mL IVPB  Status:  Discontinued        3 g 200 mL/hr over 30 Minutes Intravenous  Once 07/25/20 1418 07/25/20 1446       I have personally reviewed the following labs and images: CBC: Recent Labs  Lab 07/29/20 0713 07/29/20 0713 07/30/20 0343 07/30/20 0343 07/31/20 0443 08/01/20 0002 08/01/20 0344 08/02/20 0634 08/03/20 0515  WBC 9.4   < > 8.9   < > 9.9 12.9* 12.5* 13.9* 11.5*  NEUTROABS 6.6  --  5.9  --   --   --   --  12.5* 10.1*  HGB 7.0*   < > 7.4*   < > 7.7* 7.5* 7.6* 8.4* 8.6*  HCT 22.8*   < > 24.3*   < > 25.2* 24.4* 24.4* 27.7* 28.1*  MCV 90.8   < > 89.7   < > 89.7 90.7 90.7 90.2 89.2  PLT 322   < > 315   < > 303 270 253 256 162   < > = values in this interval not displayed.   BMP &GFR Recent Labs  Lab 07/29/20 0713 07/30/20 0343 07/31/20 0443 07/31/20 0443 08/01/20 0002 08/01/20 0344 08/02/20 0723 08/03/20 0515 08/04/20 0405  NA 144   < > 144  --  148* 148* 146* 142  --   K 4.0   < > 4.0   < > 3.6 3.4* 4.0 3.7 3.6  CL 108   < > 106  --  108 110 109  106  --   CO2 27   < > 29  --  29  _0 --   GLUCOSE 117*   < > 112*  --  156* 208* 174* 208*  --   BUN 23   < > 25*  --  28* 27* 27* 29*  --   CREATININE 0.86   < > 0.88  --  0.93 0.95 0.70 0.72  --   CALCIUM 9.4   < > 9.5  --  9.2 9.3 9.5 9.7  --   MG 2.3  --   --   --  2.5*  --  2.5* 2.6* 2.3  PHOS  --   --   --   --   --   --  3.6 2.4* 1.3*   < > = values in this interval not displayed.   Estimated Creatinine Clearance: 120.8 mL/min (by C-G formula based on SCr of 0.72 mg/dL). Liver & Pancreas: No results for input(s): AST, ALT, ALKPHOS, BILITOT, PROT, ALBUMIN in the last 168 hours. No results for input(s): LIPASE, AMYLASE in the last 168 hours. No results for input(s): AMMONIA in the last 168 hours. Diabetic: No results for input(s): HGBA1C in the last 72 hours. Recent Labs  Lab 08/02/20 0021  GLUCAP 129*   Cardiac Enzymes: No results for input(s): CKTOTAL, CKMB, CKMBINDEX, TROPONINI in the last 168 hours. No results for input(s): PROBNP in the last 8760 hours. Coagulation Profile: No results for input(s): INR, PROTIME in the last 168 hours. Thyroid Function Tests: No results for input(s): TSH, T4TOTAL, FREET4, T3FREE, THYROIDAB in the last 72 hours. Lipid Profile: No results for input(s): CHOL, HDL, LDLCALC, TRIG, CHOLHDL, LDLDIRECT in the last 72 hours. Anemia Panel: No results for input(s): VITAMINB12, FOLATE, FERRITIN, TIBC, IRON, RETICCTPCT in the last 72 hours. Urine analysis:    Component Value Date/Time   COLORURINE YELLOW 07/19/2020 1935   APPEARANCEUR CLOUDY (A) 07/19/2020 1935   APPEARANCEUR Clear 02/01/2020 0920   LABSPEC 1.012 07/19/2020 1935   PHURINE 5.0 07/19/2020 1935   GLUCOSEU NEGATIVE 07/19/2020 1935   HGBUR NEGATIVE 07/19/2020 1935   BILIRUBINUR NEGATIVE 07/19/2020 1935   BILIRUBINUR Negative 02/01/2020 0920   KETONESUR NEGATIVE 07/19/2020 1935   PROTEINUR NEGATIVE 07/19/2020 1935   NITRITE NEGATIVE 07/19/2020 1935   Celebration (A)  07/19/2020 1935   Sepsis Labs: Invalid input(s): PROCALCITONIN, [place]  Microbiology: Recent Results (from the past 240 hour(s))  Blood culture (routine x 2)     Status: None   Collection Time: 07/25/20  3:34 PM   Specimen: BLOOD  Result Value Ref Range Status   Specimen Description BLOOD BLOOD RIGHT FOREARM  Final   Special Requests   Final    BOTTLES DRAWN AEROBIC AND ANAEROBIC Blood Culture adequate volume   Culture   Final    NO GROWTH 9 DAYS Performed at [facility], [address], [place], [place] [address]    Report Status 08/03/2020 FINAL  Final  Blood culture (routine x 2)     Status: None   Collection Time: 07/25/20  3:34 PM   Specimen: BLOOD  Result Value Ref Range Status   Specimen Description BLOOD BLOOD LEFT FOREARM  Final   Special Requests   Final    BOTTLES DRAWN AEROBIC AND ANAEROBIC Blood Culture adequate volume   Culture   Final    NO GROWTH 9 DAYS Performed at [facility], [address], [place], [place] [address]    Report Status 08/03/2020 FINAL  Final  Culture, respiratory (non-expectorated)     Status: None  Collection Time: 07/25/20  6:36 PM   Specimen: Tracheal Aspirate; Respiratory  Result Value Ref Range Status   Specimen Description   Final    TRACHEAL ASPIRATE Performed at [facility], [address] [place], [place] [address]    Special Requests   Final    NONE Performed at [facility], [address] [place], [place] [address]    Gram Stain   Final    MODERATE WBC PRESENT,BOTH PMN AND MONONUCLEAR FEW GRAM POSITIVE COCCI IN CHAINS FEW GRAM POSITIVE RODS RARE GRAM NEGATIVE RODS    Culture   Final    FEW Normal respiratory flora-no Staph aureus or Pseudomonas seen Performed at [facility], [place] [address] [place], [place] [address]    Report Status 07/28/2020 FINAL  Final  MRSA PCR Screening     Status: None   Collection Time: 07/26/20  4:51 AM   Specimen: Nasopharyngeal   Result Value Ref Range Status   MRSA by PCR NEGATIVE NEGATIVE Final    Comment:        The GeneXpert MRSA Assay (FDA approved for NASAL specimens only), is one component of a comprehensive MRSA colonization surveillance program. It is not intended to diagnose MRSA infection nor to guide or monitor treatment for MRSA infections. Performed at [facility], [address] [place], [place] [address]   Culture, bal-quantitative     Status: None   Collection Time: 07/31/20  7:40 PM   Specimen: Bronchoalveolar Lavage; Respiratory  Result Value Ref Range Status   Specimen Description   Final    BRONCHIAL ALVEOLAR LAVAGE Performed at [facility], [address] [place], [place] [address]    Special Requests NONE  Final   Gram Stain   Final    FEW WBC PRESENT, PREDOMINANTLY PMN NO ORGANISMS SEEN    Culture   Final    NO GROWTH 2 DAYS Performed at [facility], [place] [address] [place], [place] [address]    Report Status 08/03/2020 FINAL  Final    Radiology Studies: DG Chest Port 1 View  Result Date: 08/04/2020 CLINICAL DATA:  Shortness of breath EXAM: PORTABLE CHEST 1 VIEW COMPARISON:  August 03, 2020 FINDINGS: Tracheostomy catheter tip is 7.1 cm above the carina. No pneumothorax. Apparent pneumomediastinum immediately to the right of the trachea. There is persistent consolidation with volume loss and portions of the right middle and lower lobes. There is slight left base atelectasis. Heart is prominent with pulmonary vascularity normal. No adenopathy. No bone lesions. IMPRESSION: Tracheostomy as described without pneumothorax. Apparent pneumomediastinum immediately to the right of the trachea of uncertain etiology. Persistent significant volume loss right middle and lower lobe regions with consolidation in these areas. Mild left base atelectasis. No new opacity evident. Stable cardiac silhouette. These results will be called to the ordering clinician or  representative by the Radiologist Assistant, and communication documented in the PACS or [employer]. Electronically Signed   By: Lowella Grip III M.D.   On: 08/04/2020 08:24      Melvina Pangelinan T. Rawlins  If 7PM-7AM, please contact night-coverage [email or web address] 08/04/2020, 1:15 PM

## 2020-08-04 NOTE — Procedures (Signed)
FLEXIBLE BRONCHOSCOPY PROCEDURE NOTE    Flexible bronchoscopy was performed on 08/04/20 by : Lanney Gins   assistance by : 1)Larry RT  and 2)Marcita RN    Indication for the procedure was :  Pre-procedural H&P. The following assessment was performed on the day of the procedure prior to initiating sedation History:  Chest pain n Dyspnea y Hemoptysis n Cough n Fever n Other pertinent items y trached  Examination Vital signs -reviewed as per nursing documentation today Cardiac    Murmurs: n  Rubs : n  Gallop: n Lungs Wheezing: n Rales : y Rhonchi :y  Other pertinent findings: right lung collapse   Pre-procedural assessment for Procedural Sedation included: Depth of sedation: As per anesthesia team  ASA Classification:  3 Mallampati airway assessment: 2    Medication list reviewed: y  The patient's interval history was taken and revealed: no new complaints The pre- procedure physical examination revealed: No new findings Refer to prior clinic note for details.  Informed Consent: Informed consent was obtained from:  patient after explanation of procedure and risks, benefits, as well as alternative procedures available.  Explanation of level of sedation and possible transfusion was also provided.    Procedural Preparation: Time out was performed and patient was identified by name and birthdate and procedure to be performed and side for sampling, if any, was specified. Pt was intubated by anesthesia.  The patient was appropriately draped.  Procedure Findings: Bronchoscope was inserted via ETT  without difficulty.  Posterior oropharynx, epiglottis, arytenoids, false cords and vocal cords were not visualized as these were bypassed by endotracheal tube.   The distal trachea was normal in circumference and appearance without mucosal, cartilaginous or branching abnormalities.  The main carina was mildly splayed .   All right and left lobar airways were visualized to the  Sub-segmental level.  Sub- sub segmental carinae were identified in all the distal airways.   Secretions were visible in the following airways and appeared to be thickened phlegm throughout from trachea down bilateraly.   Mucus plugging of superior segment of RLL and anterior basal segment of RLL.  This was aspirated and evacuated.    The mucosa was : mildly frialbe and edematous throughout  Airways were notable for:        exophytic lesions :n       extrinsic compression in the following distributions: n.       Friable mucosa: y       Neurosurgeon /pigmentation: n   Pictorial documentation attached: n      Specimens obtained included:    Broncho-alveolar lavage site:RLL   sent for fungal and bacterial cultures                              69m volume infused 475mvolume returned with cellular phlegm appearance   The bronchoscopy was terminated due to completion of the planned procedure and the bronchoscope was removed.   Total dosage of Lidocaine was 108m78motal fluoroscopy time was 0 minutes  Supplemental oxygen was provided at 10 lpm by nasal canula post operatively  Moderate sedation used: Fentanyl 100 mcg,  Versed 8mg75mstimated Blood loss: 0cc.  Complications included:  none     Disposition: in MICU   Follow up with Dr. AlesLanney Gins 7 days for result discussion.   FredClaudette Stapler KC DLa Grangeision of Pulmonary & Critical Care Medicine

## 2020-08-04 NOTE — Progress Notes (Signed)
CRITICAL CARE NOTE  CC  follow up respiratory failure  SUBJECTIVE Patient remains critically ill Prognosis is guarded  SIGNIFICANT EVENTS  11/2> Admitted to stepdown unit under hospitalist service with aspiration pneumonia and hypotension.  11/3>Transferred to the floor 11/8>Transferred back to stepdown.  PCCM consulted for possible mucus plugging of RLL. 11/8> s/p bedside bronchoscopy 11/9> Mucus plugging again noted on chest xray. Planning on postural drainange 11/11- CXR with RLL collapse despite previous attempts at therapy.  Discussed with family , plan for bronchoscopy in am.  11/12- no overnight events. Care plan discussed with family and hospitalist today.    STUDIES:  11/8> Chest xray New infiltrate, volume loss and consolidation in the mid and lower lung on the right. 11/9> Repeat Chest xrayProgressive airspace disease and volume loss on the right compatible with partial collapse of the right lung. Right bronchial cut off suggesting mucous plugging   MICROBIOLOGY: 11/2 BCx: No growth 11/2 UCx:  11/2 Sputum: Gram positive cocci and Gram negative rods 11/2 MRSA PCR: Negative    ANTIBIOTICS: 11/2> Unasyn > completed 11/7  REVIEW OF SYSTEMS  PATIENT IS UNABLE TO PROVIDE COMPLETE REVIEW OF SYSTEMS DUE TO SEVERE CRITICAL ILLNESS   BP (!) 103/56   Pulse 93   Temp 98.7 F (37.1 C) (Axillary)   Resp (!) 30   Ht _0  (1.956 m)   Wt 108 kg   SpO2 90%   BMI 28.23 kg/m   I/O last 3 completed shifts: In: 400 [NG/GT:400] Out: 3150 [Urine:3150] Total I/O In: -  Out: 400 [Urine:400]  SpO2: 90 % O2 Flow Rate (L/min): 10 L/min FiO2 (%): 35 %  Estimated body mass index is 28.23 kg/m as calculated from the following:  Height as of this encounter: _1  (1.956 m).  Weight as of this encounter: 108 kg.  Pressure Injury 07/26/20 Sacrum Unstageable - Full thickness tissue loss in which the base of the injury is covered by slough (yellow, tan, gray, green or brown)  and/or eschar (tan, brown or black) in the wound bed. (Active)  07/26/20 0513  Location: Sacrum  Location Orientation:   Staging: Unstageable - Full thickness tissue loss in which the base of the injury is covered by slough (yellow, tan, gray, green or brown) and/or eschar (tan, brown or black) in the wound bed.  Wound Description (Comments):   Present on Admission: Yes    PHYSICAL EXAMINATION: GENERAL:  69 y.o.-year-old patient lying in the bed with no acute distress. Sitting up in bed. Trach capped EYES: Pupils equal, round, reactive to light and accommodation. No scleral icterus. Extraocular muscles intact.  HEENT: Head atraumatic, normocephalic. Oropharynx and nasopharynx clear.  NECK:  Supple, no jugular venous distention. No thyroid enlargement, no tenderness. + Capped Trach LUNGS: Normal breath sounds bilaterally, no wheezing, Positive for  rales,rhonchi or crepitation. No use of accessory muscles of respiration.  CARDIOVASCULAR: S1, S2 normal. No murmurs, rubs, or gallops.  ABDOMEN: Soft, nontender, nondistended. Bowel sounds present. No organomegaly or mass.  EXTREMITIES: Generalized edema. Dependent edema R>L, cyanosis, or clubbing.  NEUROLOGIC: Cranial nerved grossly intact except as noted otherwise.. Sensation diminished in all extremities.. Gait not checked. Dysphonic speech, B/l UE shoulder abduction 1+/5, distally 0/5 with ?trace flicker left wrist flexor. B/l LE: 0/5 proximal to distal PSYCHIATRIC: The patient is alert and oriented x 3.  SKIN: Sacral ulcer present per wound care. Not assessed.  MEDICATIONS: I have reviewed all medications and confirmed regimen as documented Scheduled Meds: . ascorbic acid  500 mg Per Tube BID  . baclofen  10 mg Per Tube TID  . bisacodyl  10 mg Rectal QHS  . chlorhexidine  15 mL Mouth Rinse BID  . Chlorhexidine Gluconate Cloth  6 each Topical Daily  . collagenase   Topical Daily  . enoxaparin (LOVENOX) injection  40 mg Subcutaneous Q24H   . famotidine  20 mg Per Tube Daily  . feeding supplement (OSMOLITE 1.5 CAL)  237 mL Per Tube Q3H  . feeding supplement (PROSource TF)  45 mL Per Tube BID  . ferrous sulfate  75 mg Per Tube Daily  . fludrocortisone  0.2 mg Per Tube Daily  . FLUoxetine  40 mg Per Tube Daily  . free water  400 mL Per Tube Q4H  . glycopyrrolate  1 mg Per Tube TID  . guaiFENesin  20 mL Per Tube Q6H  . lidocaine  1 patch Transdermal Q24H  . lidocaine  15 mL Mouth/Throat Once  . mouth rinse  15 mL Mouth Rinse q12n4p  . methylPREDNISolone (SOLU-MEDROL) injection  20 mg Intravenous Q12H  . midodrine  10 mg Per Tube TID WC  . multivitamin  5 mL Per Tube Daily  . nutrition supplement (JUVEN)  1 packet Per Tube BID BM  . polycarbophil  625 mg Per Tube Daily  . saccharomyces boulardii  250 mg Per Tube BID  . scopolamine  1 patch Transdermal Q72H  . Simethicone  80 mg Per Tube QID  . traZODone  50 mg Per Tube QHS   Continuous Infusions: . sodium chloride Stopped (07/25/20 2347)  . dextrose 5 % with KCl 20 mEq / L Stopped (08/04/20 3202)  . potassium PHOSPHATE IVPB (in mmol) 86 mL/hr at 08/04/20 0900   PRN Meds:.acetaminophen, bisacodyl, hydrocortisone cream  CULTURE RESULTS   Recent Results (from the past 240 hour(s))  Respiratory Panel by RT PCR (Flu A&B, Covid) - Nasopharyngeal Swab     Status: None   Collection Time: 07/25/20  2:11 PM   Specimen: Nasopharyngeal Swab  Result Value Ref Range Status   SARS Coronavirus 2 by RT PCR NEGATIVE NEGATIVE Final    Comment: (NOTE) SARS-CoV-2 target nucleic acids are NOT DETECTED.  The SARS-CoV-2 RNA is generally detectable in upper respiratoy specimens during the acute phase of infection. The lowest concentration of SARS-CoV-2 viral copies this assay can detect is 131 copies/mL. A negative result does not preclude SARS-Cov-2 infection and should not be used as the sole basis for treatment or other patient management decisions. A negative result may occur with   improper specimen collection/handling, submission of specimen other than nasopharyngeal swab, presence of viral mutation(s) within the areas targeted by this assay, and inadequate number of viral copies (<131 copies/mL). A negative result must be combined with clinical observations, patient history, and epidemiological information. The expected result is Negative.  Fact Sheet for Patients:  PinkCheek.be  Fact Sheet for Healthcare Providers:  GravelBags.it  This test is no t yet approved or cleared by the [place] FDA and  has been authorized for detection and/or diagnosis of SARS-CoV-2 by FDA under an Emergency Use Authorization (EUA). This EUA will remain  in effect (meaning this test can be used) for the duration of the COVID-19 declaration under Section 564(b)(1) of the Act, 21 U.S.C. section 360bbb-3(b)(1), unless the authorization is terminated or revoked sooner.     Influenza A by PCR NEGATIVE NEGATIVE Final   Influenza B by PCR NEGATIVE NEGATIVE Final    Comment: (NOTE) The  Xpert Xpress SARS-CoV-2/FLU/RSV assay is intended as an aid in  the diagnosis of influenza from Nasopharyngeal swab specimens and  should not be used as a sole basis for treatment. Nasal washings and  aspirates are unacceptable for Xpert Xpress SARS-CoV-2/FLU/RSV  testing.  Fact Sheet for Patients: PinkCheek.be  Fact Sheet for Healthcare Providers: GravelBags.it  This test is not yet approved or cleared by the [place] FDA and  has been authorized for detection and/or diagnosis of SARS-CoV-2 by  FDA under an Emergency Use Authorization (EUA). This EUA will remain  in effect (meaning this test can be used) for the duration of the  Covid-19 declaration under Section 564(b)(1) of the Act, 21  U.S.C. section 360bbb-3(b)(1), unless the authorization is  terminated or  revoked. Performed at [facility], [address] [place], [place] [address]   Blood culture (routine x 2)     Status: None   Collection Time: 07/25/20  3:34 PM   Specimen: BLOOD  Result Value Ref Range Status   Specimen Description BLOOD BLOOD RIGHT FOREARM  Final   Special Requests   Final    BOTTLES DRAWN AEROBIC AND ANAEROBIC Blood Culture adequate volume   Culture   Final    NO GROWTH 9 DAYS Performed at [facility], [address], [place], [place] [address]    Report Status 08/03/2020 FINAL  Final  Blood culture (routine x 2)     Status: None   Collection Time: 07/25/20  3:34 PM   Specimen: BLOOD  Result Value Ref Range Status   Specimen Description BLOOD BLOOD LEFT FOREARM  Final   Special Requests   Final    BOTTLES DRAWN AEROBIC AND ANAEROBIC Blood Culture adequate volume   Culture   Final    NO GROWTH 9 DAYS Performed at [facility], [address], [place], [place] [address]    Report Status 08/03/2020 FINAL  Final  Culture, respiratory (non-expectorated)     Status: None   Collection Time: 07/25/20  6:36 PM   Specimen: Tracheal Aspirate; Respiratory  Result Value Ref Range Status   Specimen Description   Final    TRACHEAL ASPIRATE Performed at [facility], [address], [place], [place] [address]    Special Requests   Final    NONE Performed at [facility], [address] [place], [place] [address]    Gram Stain   Final    MODERATE WBC PRESENT,BOTH PMN AND MONONUCLEAR FEW GRAM POSITIVE COCCI IN CHAINS FEW GRAM POSITIVE RODS RARE GRAM NEGATIVE RODS    Culture   Final    FEW Normal respiratory flora-no Staph aureus or Pseudomonas seen Performed at [facility], [place] [address], [place], [place] [address]    Report Status 07/28/2020 FINAL  Final  MRSA PCR Screening     Status: None   Collection Time: 07/26/20  4:51 AM   Specimen: Nasopharyngeal  Result Value Ref Range Status   MRSA by PCR  NEGATIVE NEGATIVE Final    Comment:        The GeneXpert MRSA Assay (FDA approved for NASAL specimens only), is one component of a comprehensive MRSA colonization surveillance program. It is not intended to diagnose MRSA infection nor to guide or monitor treatment for MRSA infections. Performed at [facility], [address] [place],  [address]   Culture, bal-quantitative     Status: None   Collection Time: 07/31/20  7:40 PM   Specimen: Bronchoalveolar  Lavage; Respiratory  Result Value Ref Range Status   Specimen Description   Final    BRONCHIAL ALVEOLAR LAVAGE Performed at [facility], [address] [place], [place] [address]    Special Requests NONE  Final   Gram Stain   Final    FEW WBC PRESENT, PREDOMINANTLY PMN NO ORGANISMS SEEN    Culture   Final    NO GROWTH 2 DAYS Performed at [facility], [place] [address], [place], [place] [address]    Report Status 08/03/2020 FINAL  Final    CBC    Component Value Date/Time   WBC 11.5 (H) 08/03/2020 0515   RBC 3.15 (L) 08/03/2020 0515   HGB 8.6 (L) 08/03/2020 0515   HGB 14.7 02/01/2020 0927   HCT 28.1 (L) 08/03/2020 0515   HCT 44.1 02/01/2020 0927   PLT 162 08/03/2020 0515   PLT 267 02/01/2020 0927   MCV 89.2 08/03/2020 0515   MCV 96 02/01/2020 0927   MCH 27.3 08/03/2020 0515   MCHC 30.6 08/03/2020 0515   RDW 16.5 (H) 08/03/2020 0515   RDW 12.3 02/01/2020 0927   LYMPHSABS 0.8 08/03/2020 0515   LYMPHSABS 1.5 02/01/2020 0927   MONOABS 0.4 08/03/2020 0515   EOSABS 0.1 08/03/2020 0515   EOSABS 0.1 02/01/2020 0927   BASOSABS 0.0 08/03/2020 0515   BASOSABS 0.0 02/01/2020 0927   CMP Latest Ref Rng & Units 08/04/2020 08/03/2020 08/02/2020  Glucose 70 - 99 mg/dL - 208(H) 174(H)  BUN 8 - 23 mg/dL - 29(H) 27(H)  Creatinine 0.61 - 1.24 mg/dL - 0.72 0.70  Sodium 135 - 145 mmol/L - 142 146(H)  Potassium 3.5 - 5.1 mmol/L 3.6 3.7 4.0  Chloride 98 - 111 mmol/L - 106 109  CO2 22 - 32 mmol/L  - 28 27  Calcium 8.9 - 10.3 mg/dL - 9.7 9.5  Total Protein 6.5 - 8.1 g/dL - - -  Total Bilirubin 0.3 - 1.2 mg/dL - - -  Alkaline Phos 38 - 126 U/L - - -  AST 15 - 41 U/L - - -  ALT 0 - 44 U/L - - -      IMAGING    DG Chest Port 1 View  Result Date: 08/04/2020 CLINICAL DATA:  Shortness of breath EXAM: PORTABLE CHEST 1 VIEW COMPARISON:  August 03, 2020 FINDINGS: Tracheostomy catheter tip is 7.1 cm above the carina. No pneumothorax. Apparent pneumomediastinum immediately to the right of the trachea. There is persistent consolidation with volume loss and portions of the right middle and lower lobes. There is slight left base atelectasis. Heart is prominent with pulmonary vascularity normal. No adenopathy. No bone lesions. IMPRESSION: Tracheostomy as described without pneumothorax. Apparent pneumomediastinum immediately to the right of the trachea of uncertain etiology. Persistent significant volume loss right middle and lower lobe regions with consolidation in these areas. Mild left base atelectasis. No new opacity evident. Stable cardiac silhouette. These results will be called to the ordering clinician or representative by the Radiologist Assistant, and communication documented in the PACS or [employer]. Electronically Signed   By: Lowella Grip III M.D.   On: 08/04/2020 08:24     Nutrition Status: Nutrition Problem: Inadequate oral intake Etiology: inability to eat Signs/Symptoms: NPO status (reliance on tube feeds and free water per G-tube to meet calorie/protein/hydration needs.) Interventions: Tube feeding, Prostat, MVI    Indwelling Urinary Catheter continued, requirement due to   Reason to continue Indwelling Urinary Catheter strict Intake/Output monitoring for hemodynamic instability  Central Line/ continued, requirement due to  Reason to continue [employer] of central venous pressure or other hemodynamic parameters and poor IV access   .SpO2: 100 % O2  Flow Rate (L/min): 10 L/min FiO2 (%): 40 %   ASSESSMENT / PLAN:  69 y.o male with significant PMH of HTN, acute on chronic hypoxic respiratory failure, AKI due to hypovolemia, C5 vertebral body fracture, C3-C6 cord compression with edema s/p C3-C6 laminectomy with fusion and repair of dural tear  (04/26/20), spinal shock, post procedural PTX,r ecurrent aspiration pneumonia with mucus plugging s/p PEG/TRACH 05/04/20, and PEA Cardiac arrest with ROSC admitted on 07/25/20 with aspiration pneumonia. Now with worsening hypoxia, dyspnea and tachypnea on exam. Repeat chest Xray concerning for mucus plugging s/p bronchoscopy.    Acute on Chronic Hypoxic Respiratory Failure secondary to recurrent aspiration pneumonia, possible mucus plug on RLL S/p trach placement 05/04/20 S/p Bronchoscopy 11/8. Now with recurrent plugging again noted on this am chest xray -Supplemental O2 as needed to maintain O2 saturations 88 to 92% via trach collar -Follow intermittent ABG and chest x-ray as needed -As needed bronchodilators -Aggressive pulmonary hygiene -suctioning PRN with trach care -Aspiration precautions         -patient with rhonchi - improved - will decrease solumedrol to 20 BID   Aspiration pneumonia Completed 7 day course of treatment with Unasyn. Spike a temp on 11/6 while on abx -Monitor fever curve -Monitor white count -Follow cultures       Episodic neurogenic hypotension Likely 2/2 spinal cord injury. Appears hemodynamically stable -Continue Midodrine and Florinef as scheduled -SCDs for venous return   Dysphagia s/s SCI S/p PEG placement 8/4 -Hold TF due to aspiration -Aspiration precautions- patient had normal PEG tube barium study -  -IVFs hydration while NPO     Iron Deficiency Anemia with baseline hgb 8.0 S/p Venofer infusion 11/6 -Continue iron supplement -Transfuse for hgb<7.0    Transaminitis - resolved Patient noted to have mild elevated LFTs, appears to have been elevated  prior. 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 per WOC recommendations       Best practice:  Diet:  feeds with PEG Pain/Anxiety/Delirium protocol (if indicated): N/A VAP protocol (if indicated): N/A DVT prophylaxis: Lovenox SQ GI prophylaxis: Famotidine Glucose control: N/A Mobility: Quadriplegic Code Status: Full Code Family Communication: Updated patient's wife who is at the bedside    Critical care provider statement:    Critical care time (minutes):  33   Critical care time was exclusive of:  Separately billable procedures and  treating other patients   Critical care was necessary to treat or prevent imminent or  life-threatening deterioration of the following conditions:  aspiration pneumonia, mucus plugging of airways, pneumonia   Critical care was time spent personally by me on the following  activities:  Development of treatment plan with patient or surrogate,  discussions with consultants, evaluation of patient's response to  treatment, examination of patient, obtaining history from patient or  surrogate, ordering and performing treatments and interventions, ordering  and review of laboratory studies and re-evaluation of patient's condition  I assumed direction of critical care for this patient from another  provider in my specialty: no      Ottie Glazier, M.D.  [facility]

## 2020-08-04 NOTE — Progress Notes (Signed)
   08/04/20 0100  Clinical Encounter Type  Visited With Patient and family together  Visit Type Follow-up  Referral From Chaplain  Consult/Referral To [facility] rounding, chaplain briefly visited with Pt and his wife. Chaplain commented on how she looked better and how she was smiling. Chaplain asked Pt a question and he smiled and slightly nodded. Chaplain gave [name] chaplain's availability and left.

## 2020-08-05 ENCOUNTER — Inpatient Hospital Stay: Payer: BC Managed Care – PPO

## 2020-08-05 LAB — BASIC METABOLIC PANEL
Anion gap: 7 (ref 5–15)
BUN: 29 mg/dL — ABNORMAL HIGH (ref 8–23)
CO2: 28 mmol/L (ref 22–32)
Calcium: 8.7 mg/dL — ABNORMAL LOW (ref 8.9–10.3)
Chloride: 100 mmol/L (ref 98–111)
Creatinine, Ser: 0.59 mg/dL — ABNORMAL LOW (ref 0.61–1.24)
GFR, Estimated: >60 mL/min (ref >60)
Glucose, Bld: 228 mg/dL — ABNORMAL HIGH (ref 70–99)
Potassium: 4.3 mmol/L (ref 3.5–5.1)
Sodium: 135 mmol/L (ref 135–145)

## 2020-08-05 LAB — CBC WITH DIFFERENTIAL/PLATELET
Abs Immature Granulocytes: 0.35 10*3/uL — ABNORMAL HIGH (ref 0.00–0.07)
Basophils Absolute: 0 10*3/uL (ref 0.0–0.1)
Basophils Relative: 0 %
Eosinophils Absolute: 0 10*3/uL (ref 0.0–0.5)
Eosinophils Relative: 0 %
HCT: 25.7 % — ABNORMAL LOW (ref 39.0–52.0)
Hemoglobin: 8.1 g/dL — ABNORMAL LOW (ref 13.0–17.0)
Immature Granulocytes: 3 %
Lymphocytes Relative: 9 %
Lymphs Abs: 1.2 10*3/uL (ref 0.7–4.0)
MCH: 27.6 pg (ref 26.0–34.0)
MCHC: 31.5 g/dL (ref 30.0–36.0)
MCV: 87.7 fL (ref 80.0–100.0)
Monocytes Absolute: 0.9 10*3/uL (ref 0.1–1.0)
Monocytes Relative: 7 %
Neutro Abs: 11.1 10*3/uL — ABNORMAL HIGH (ref 1.7–7.7)
Neutrophils Relative %: 81 %
Platelets: 263 10*3/uL (ref 150–400)
RBC: 2.93 MIL/uL — ABNORMAL LOW (ref 4.22–5.81)
RDW: 16.6 % — ABNORMAL HIGH (ref 11.5–15.5)
WBC: 13.6 10*3/uL — ABNORMAL HIGH (ref 4.0–10.5)
nRBC: 0.4 % — ABNORMAL HIGH (ref 0.0–0.2)

## 2020-08-05 LAB — MAGNESIUM: Magnesium: 2.1 mg/dL (ref 1.7–2.4)

## 2020-08-05 LAB — GLUCOSE, CAPILLARY
Glucose-Capillary: 205 mg/dL — ABNORMAL HIGH (ref 70–99)
Glucose-Capillary: 134 mg/dL — ABNORMAL HIGH (ref 70–99)

## 2020-08-05 LAB — PHOSPHORUS: Phosphorus: 1.8 mg/dL — ABNORMAL LOW (ref 2.5–4.6)

## 2020-08-05 IMAGING — DX DG CHEST 1V PORT
1 series · 1 of 1 positions shown · non-contrast
Comparison: [DATE]

CLINICAL DATA: Cough and shortness of breath

EXAM:
PORTABLE CHEST 1 VIEW

[chest ap]
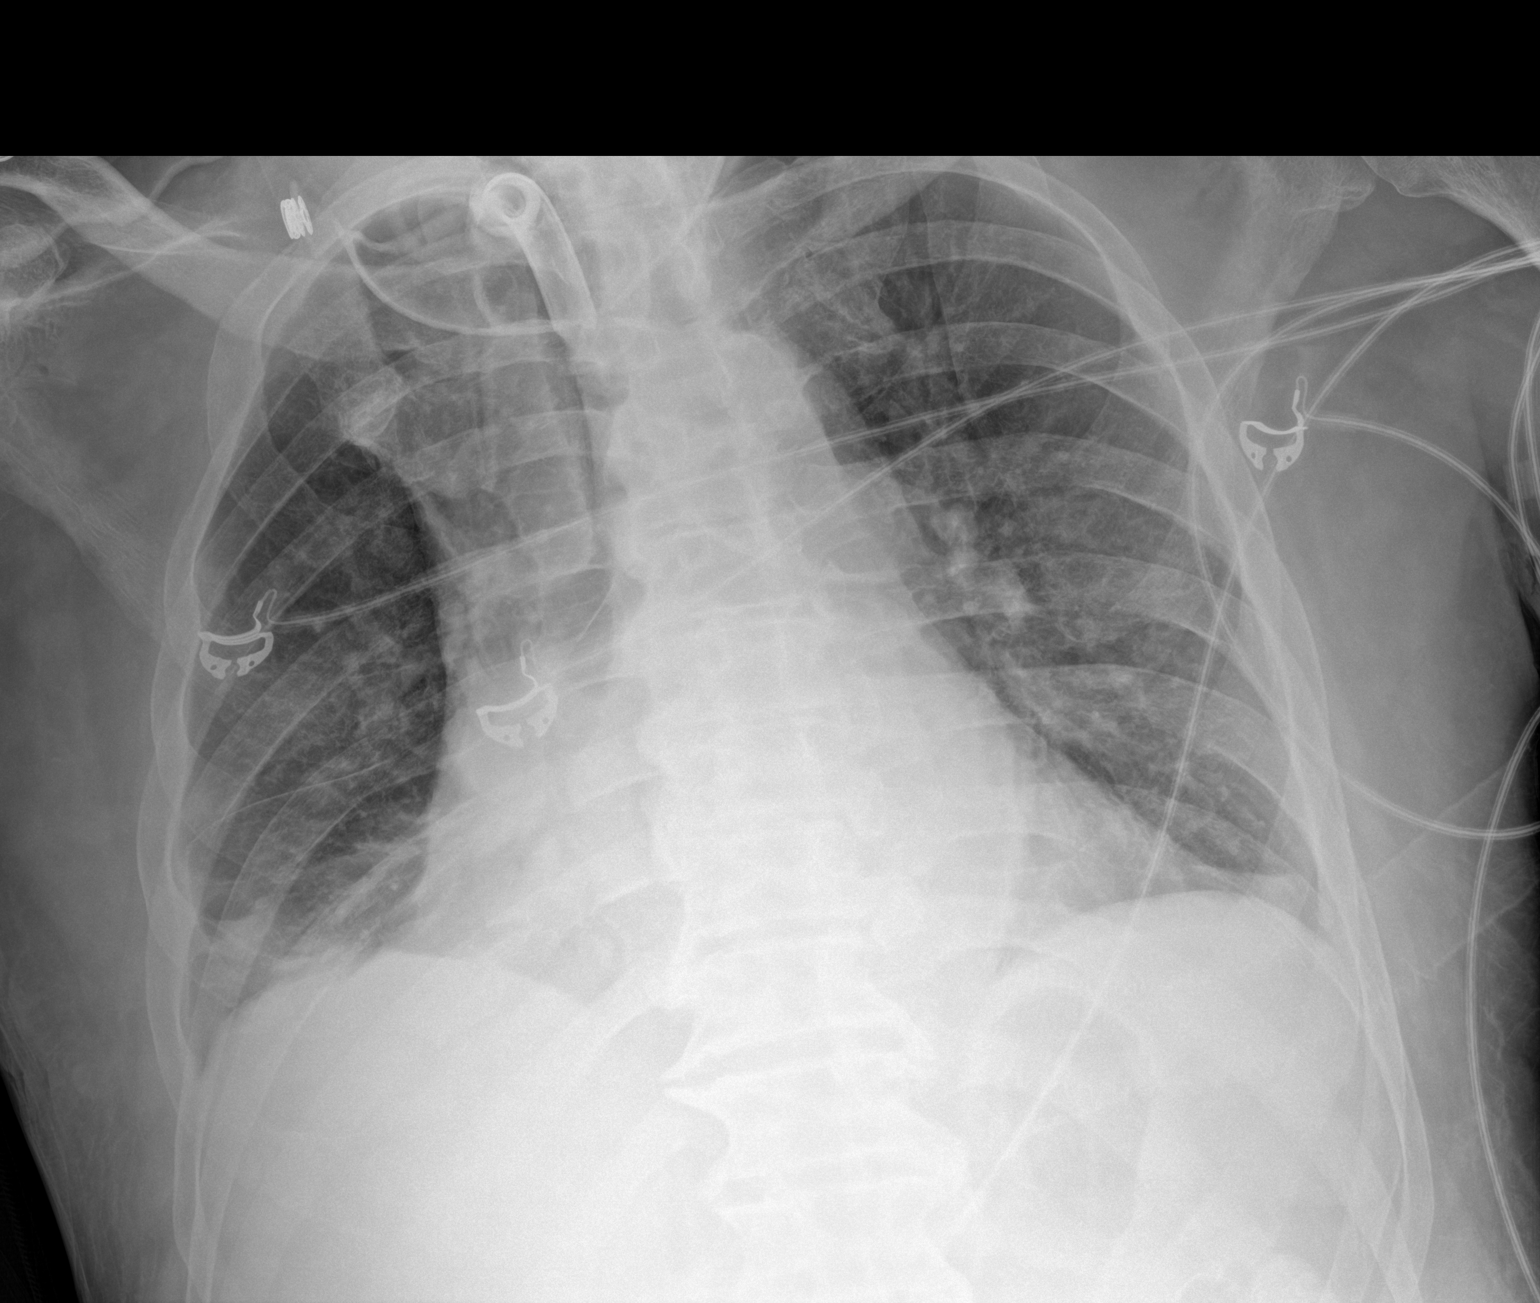

[1 of 1 positions shown; findings below may reference images not displayed]

FINDINGS: Tracheostomy catheter tip is 6.0 cm above the carina. No
pneumothorax. Questionable pneumomediastinum slightly to the right
of midline there is bibasilar atelectasis. There is persistent
airspace consolidation in portions of the right mid and lower lung
regions. Heart is mildly enlarged, stable, with pulmonary
vascularity within normal limits. No adenopathy. No bone lesions.
IMPRESSION: Tracheostomy as described without pneumothorax. Question mild
pneumomediastinum, similar to 1 day prior. Persistent consolidation
in portions of the right middle and lower lobes with bibasilar
atelectasis. Stable cardiac prominence.

## 2020-08-05 MED ORDER — POTASSIUM PHOSPHATES 15 MMOLE/5ML IV SOLN
45.0000 mmol | Freq: Once | INTRAVENOUS | Status: AC
  Administered 2020-08-05: 45 mmol via INTRAVENOUS
  Filled 2020-08-05: qty 15

## 2020-08-05 NOTE — Progress Notes (Signed)
CRITICAL CARE NOTE  CC  follow up respiratory failure  SUBJECTIVE Patient remains critically ill Prognosis is guarded  SIGNIFICANT EVENTS  11/2> Admitted to stepdown unit under hospitalist service with aspiration pneumonia and hypotension.  11/3>Transferred to the floor 11/8>Transferred back to stepdown.  PCCM consulted for possible mucus plugging of RLL. 11/8> s/p bedside bronchoscopy 11/9> Mucus plugging again noted on chest xray. Planning on postural drainange 11/11- CXR with RLL collapse despite previous attempts at therapy.  Discussed with family , plan for bronchoscopy in am.  11/12- no overnight events. Care plan discussed with family and hospitalist today.  08/05/20- patient improved , CXR improved will optmize.    STUDIES:  11/8> Chest xray New infiltrate, volume loss and consolidation in the mid and lower lung on the right. 11/9> Repeat Chest xrayProgressive airspace disease and volume loss on the right compatible with partial collapse of the right lung. Right bronchial cut off suggesting mucous plugging   MICROBIOLOGY: 11/2 BCx: No growth 11/2 UCx:  11/2 Sputum: Gram positive cocci and Gram negative rods 11/2 MRSA PCR: Negative    ANTIBIOTICS: 11/2> Unasyn > completed 11/7  REVIEW OF SYSTEMS  PATIENT IS UNABLE TO PROVIDE COMPLETE REVIEW OF SYSTEMS DUE TO SEVERE CRITICAL ILLNESS   BP (!) 103/56   Pulse 93   Temp 98.7 F (37.1 C) (Axillary)   Resp (!) 30   Ht _0  (1.956 m)   Wt 108 kg   SpO2 90%   BMI 28.23 kg/m   I/O last 3 completed shifts: In: 400 [NG/GT:400] Out: 3150 [Urine:3150] Total I/O In: -  Out: 400 [Urine:400]  SpO2: 90 % O2 Flow Rate (L/min): 10 L/min FiO2 (%): 35 %  Estimated body mass index is 28.23 kg/m as calculated from the following:  Height as of this encounter: _1  (1.956 m).  Weight as of this encounter: 108 kg.  Pressure Injury 07/26/20 Sacrum Unstageable - Full thickness tissue loss in which the base of the injury  is covered by slough (yellow, tan, gray, green or brown) and/or eschar (tan, brown or black) in the wound bed. (Active)  07/26/20 0513  Location: Sacrum  Location Orientation:   Staging: Unstageable - Full thickness tissue loss in which the base of the injury is covered by slough (yellow, tan, gray, green or brown) and/or eschar (tan, brown or black) in the wound bed.  Wound Description (Comments):   Present on Admission: Yes    PHYSICAL EXAMINATION: GENERAL:  69 y.o.-year-old patient lying in the bed with no acute distress. Sitting up in bed. Trach capped EYES: Pupils equal, round, reactive to light and accommodation. No scleral icterus. Extraocular muscles intact.  HEENT: Head atraumatic, normocephalic. Oropharynx and nasopharynx clear.  NECK:  Supple, no jugular venous distention. No thyroid enlargement, no tenderness. + Capped Trach LUNGS: Normal breath sounds bilaterally, no wheezing, Positive for  rales,rhonchi or crepitation. No use of accessory muscles of respiration.  CARDIOVASCULAR: S1, S2 normal. No murmurs, rubs, or gallops.  ABDOMEN: Soft, nontender, nondistended. Bowel sounds present. No organomegaly or mass.  EXTREMITIES: Generalized edema. Dependent edema R>L, cyanosis, or clubbing.  NEUROLOGIC: Cranial nerved grossly intact except as noted otherwise.. Sensation diminished in all extremities.. Gait not checked. Dysphonic speech, B/l UE shoulder abduction 1+/5, distally 0/5 with ?trace flicker left wrist flexor. B/l LE: 0/5 proximal to distal PSYCHIATRIC: The patient is alert and oriented x 3.  SKIN: Sacral ulcer present per wound care. Not assessed.  MEDICATIONS: I have reviewed all medications and confirmed  regimen as documented Scheduled Meds: . ascorbic acid  500 mg Per Tube BID  . baclofen  10 mg Per Tube TID  . bisacodyl  10 mg Rectal QHS  . chlorhexidine  15 mL Mouth Rinse BID  . Chlorhexidine Gluconate Cloth  6 each Topical Daily  . collagenase   Topical Daily  .  enoxaparin (LOVENOX) injection  40 mg Subcutaneous Q24H  . famotidine  20 mg Per Tube Daily  . feeding supplement (OSMOLITE 1.5 CAL)  237 mL Per Tube Q3H  . feeding supplement (PROSource TF)  45 mL Per Tube BID  . ferrous sulfate  75 mg Per Tube Daily  . fludrocortisone  0.2 mg Per Tube Daily  . FLUoxetine  40 mg Per Tube Daily  . free water  400 mL Per Tube Q4H  . glycopyrrolate  1 mg Per Tube TID  . guaiFENesin  20 mL Per Tube Q6H  . lidocaine  1 patch Transdermal Q24H  . lidocaine  15 mL Mouth/Throat Once  . mouth rinse  15 mL Mouth Rinse q12n4p  . methylPREDNISolone (SOLU-MEDROL) injection  20 mg Intravenous Q12H  . midodrine  10 mg Per Tube TID WC  . multivitamin  5 mL Per Tube Daily  . nutrition supplement (JUVEN)  1 packet Per Tube BID BM  . polycarbophil  625 mg Per Tube Daily  . saccharomyces boulardii  250 mg Per Tube BID  . scopolamine  1 patch Transdermal Q72H  . Simethicone  80 mg Per Tube QID  . traZODone  50 mg Per Tube QHS   Continuous Infusions: . sodium chloride Stopped (07/25/20 2347)  . dextrose 5 % with KCl 20 mEq / L 20 mEq (08/05/20 0528)  . potassium PHOSPHATE IVPB (in mmol)     PRN Meds:.acetaminophen, bisacodyl, hydrocortisone cream  CULTURE RESULTS   Recent Results (from the past 240 hour(s))  Culture, bal-quantitative     Status: None   Collection Time: 07/31/20  7:40 PM   Specimen: Bronchoalveolar Lavage; Respiratory  Result Value Ref Range Status   Specimen Description   Final    BRONCHIAL ALVEOLAR LAVAGE Performed at [facility], [address] [place], [place] [address]    Special Requests NONE  Final   Gram Stain   Final    FEW WBC PRESENT, PREDOMINANTLY PMN NO ORGANISMS SEEN    Culture   Final    NO GROWTH 2 DAYS Performed at [facility], [place] [address], [place], [place] [address]    Report Status 08/03/2020 FINAL  Final  Culture, bal-quantitative     Status: None (Preliminary result)   Collection Time: 08/04/20   5:38 PM   Specimen: Bronchoalveolar Lavage; Respiratory  Result Value Ref Range Status   Specimen Description   Final    BRONCHIAL ALVEOLAR LAVAGE Performed at [facility], [address] [place], [place] [address]    Special Requests   Final    NONE Performed at [facility], [place], [place] [address]    Gram Stain NO WBC SEEN NO ORGANISMS SEEN   Final   Culture   Final    NO GROWTH < 12 HOURS Performed at [facility], [place] [address] [place], [place] [address]    Report Status PENDING  Incomplete    CBC    Component Value Date/Time   WBC 13.6 (H) 08/05/2020 0739   RBC 2.93 (L) 08/05/2020 0739   HGB 8.1 (L) 08/05/2020 1308  HGB 14.7 02/01/2020 0927   HCT 25.7 (L) 08/05/2020 0739   HCT 44.1 02/01/2020 0927   PLT 263 08/05/2020 0739   PLT 267 02/01/2020 0927   MCV 87.7 08/05/2020 0739   MCV 96 02/01/2020 0927   MCH 27.6 08/05/2020 0739   MCHC 31.5 08/05/2020 0739   RDW 16.6 (H) 08/05/2020 0739   RDW 12.3 02/01/2020 0927   LYMPHSABS 1.2 08/05/2020 0739   LYMPHSABS 1.5 02/01/2020 0927   MONOABS 0.9 08/05/2020 0739   EOSABS 0.0 08/05/2020 0739   EOSABS 0.1 02/01/2020 0927   BASOSABS 0.0 08/05/2020 0739   BASOSABS 0.0 02/01/2020 0927   CMP Latest Ref Rng & Units 08/05/2020 08/04/2020 08/04/2020  Glucose 70 - 99 mg/dL 228(H) - -  BUN 8 - 23 mg/dL 29(H) - -  Creatinine 0.61 - 1.24 mg/dL 0.59(L) - -  Sodium 135 - 145 mmol/L 135 - -  Potassium 3.5 - 5.1 mmol/L 4.3 3.8 3.6  Chloride 98 - 111 mmol/L 100 - -  CO2 22 - 32 mmol/L 28 - -  Calcium 8.9 - 10.3 mg/dL 8.7(L) - -  Total Protein 6.5 - 8.1 g/dL - - -  Total Bilirubin 0.3 - 1.2 mg/dL - - -  Alkaline Phos 38 - 126 U/L - - -  AST 15 - 41 U/L - - -  ALT 0 - 44 U/L - - -      IMAGING    No results found.   Nutrition Status: Nutrition Problem: Inadequate oral intake Etiology: inability to eat Signs/Symptoms: NPO status (reliance on tube feeds and free water per  G-tube to meet calorie/protein/hydration needs.) Interventions: Tube feeding, Prostat, MVI    Indwelling Urinary Catheter continued, requirement due to   Reason to continue Indwelling Urinary Catheter strict Intake/Output monitoring for hemodynamic instability   Central Line/ continued, requirement due to  Reason to continue [place] of central venous pressure or other hemodynamic parameters and poor IV access   .SpO2: 100 % O2 Flow Rate (L/min): 10 L/min FiO2 (%): 35 %   ASSESSMENT / PLAN:  69 y.o male with significant PMH of HTN, acute on chronic hypoxic respiratory failure, AKI due to hypovolemia, C5 vertebral body fracture, C3-C6 cord compression with edema s/p C3-C6 laminectomy with fusion and repair of dural tear  (04/26/20), spinal shock, post procedural PTX,r ecurrent aspiration pneumonia with mucus plugging s/p PEG/TRACH 05/04/20, and PEA Cardiac arrest with ROSC admitted on 07/25/20 with aspiration pneumonia. Now with worsening hypoxia, dyspnea and tachypnea on exam. Repeat chest Xray concerning for mucus plugging s/p bronchoscopy.    Acute on Chronic Hypoxic Respiratory Failure secondary to recurrent aspiration pneumonia, possible mucus plug on RLL S/p trach placement 05/04/20 S/p Bronchoscopy 11/8. Now with recurrent plugging again noted on this am chest xray -Supplemental O2 as needed to maintain O2 saturations 88 to 92% via trach collar -Follow intermittent ABG and chest x-ray as needed -As needed bronchodilators -Aggressive pulmonary hygiene -suctioning PRN with trach care -Aspiration precautions         -patient with rhonchi - improved - will dc solumedrol    Aspiration pneumonia Completed 7 day course of treatment with Unasyn. Spike a temp on 11/6 while on abx -Monitor fever curve -Monitor white count -Follow cultures       Episodic neurogenic hypotension Likely 2/2 spinal cord injury. Appears hemodynamically stable -Continue Midodrine and Florinef as  scheduled -SCDs for venous return   Dysphagia s/s SCI S/p PEG placement 8/4 -Hold TF due to  aspiration -Aspiration precautions- patient had normal PEG tube barium study -  -IVFs hydration while NPO     Iron Deficiency Anemia with baseline hgb 8.0 S/p Venofer infusion 11/6 -Continue iron supplement -Transfuse for hgb<7.0    Transaminitis - resolved Patient noted to have mild elevated LFTs, appears to have been elevated prior. 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 per WOC recommendations       Best practice:  Diet:  feeds with PEG Pain/Anxiety/Delirium protocol (if indicated): N/A VAP protocol (if indicated): N/A DVT prophylaxis: Lovenox SQ GI prophylaxis: Famotidine Glucose control: N/A Mobility: Quadriplegic Code Status: Full Code Family Communication: Updated patient's wife who is at the bedside    Critical care provider statement:    Critical care time (minutes):  33   Critical care time was exclusive of:  Separately billable procedures and  treating other patients   Critical care was necessary to treat or prevent imminent or  life-threatening deterioration of the following conditions:  aspiration pneumonia, mucus plugging of airways, pneumonia   Critical care was time spent personally by me on the following  activities:  Development of treatment plan with patient or surrogate,  discussions with consultants, evaluation of patient's response to  treatment, examination of patient, obtaining history from patient or  surrogate,  ordering and performing treatments and interventions, ordering  and review of laboratory studies and re-evaluation of patient's condition   I assumed direction of critical care for this patient from another  provider in my specialty: no      Ottie Glazier, M.D.  Pulmonary & [place]

## 2020-08-05 NOTE — Progress Notes (Signed)
PROGRESS NOTE  Ryan Day BJS:[id number] DOB: Mar 02, 1951   PCP: Valerie Roys, DO  Patient is from: Home.   DOA: 07/25/2020 LOS: 4  Chief complaints: Shortness of breath  Brief Narrative / Interim history: 69 year old male with quadriplegia s/p fall in August 2021, trach and PEG dependence presenting with shortness of breath, cough with copious amount of sputum, and admitted for acute on chronic respiratory failure with hypoxia due to aspiration pneumonia and intermittent mucous plugging.   Patient continued to have intermittent tachypnea with right mucous plugging after a course of IV antibiotic for aspiration pneumonia.  He also spiked fever and restarted on Zosyn.  BAL culture NGTD.  On 11/11, CXR with RLL collapse.  Patient had bronchoscopy on 11/12 had shown to confirm throughout from trachea down bilaterally, mucous plugging of superior segment of RLL and anterior basal segment of RLL aspirated and evacuated.  Subjective: Seen and examined earlier this morning.  No major events overnight of this morning.  Had bronchoscopy yesterday as above.  CXR with persistent consolidation in portions of RML and LLL atelectasis.  Patient denies chest pain or shortness of breath.  Patient's wife at bedside.   Objective: Vitals:   08/05/20 1000 08/05/20 1100 08/05/20 1200 08/05/20 1300  BP: (!) 96/59 100/67 113/73 112/65  Pulse: (!) 41 (!) 50 (!) 37 (!) 57  Resp: (!) 23 (!) 23 19 (!) 23  Temp:      TempSrc:      SpO2: 100% 100% 100% 100%  Weight:      Height:        Intake/Output Summary (Last 24 hours) at 08/05/2020 1453 Last data filed at 08/05/2020 1200 Gross per 24 hour  Intake 2167.26 ml  Output 3525 ml  Net -1357.74 ml   Filed Weights   08/03/20 0500 08/04/20 0346 08/05/20 0409  Weight: 100.1 kg 107.8 kg 110.7 kg    Examination:  GENERAL: No apparent distress.  Nontoxic. HEENT: MMM.  Vision and hearing grossly intact.  NECK: Tracheostomy in place.  On trach  collar. RESP: On trach collar.  No IWOB.  Fair aeration bilaterally. CVS:  RRR. Heart sounds normal.  ABD/GI/GU: BS+. Abd soft, NTND.  G-tube in rectal tube. MSK/EXT: Quadriplegia. SKIN: no apparent skin lesion or wound NEURO: Awake and alert.  Fairly oriented.  CN grossly intact.  Quadriplegia. PSYCH: Calm. Normal affect.  Procedures:  11/8-bronchoscopy 11/12-bronchoscopy  Microbiology summarized: Limited RVP panel nonreactive. MRSA PCR negative. 11/2-Rattray culture with normal flora 11/2-blood cultures NGTD. 11/8-BAL NGTD.  Assessment & Plan: Acute on chronic hypoxemic respiratory failure due to recurrent aspiration pneumonia and recurrent mucous plugging and patient with tracheostomy and PEG tube after spinal cord injury- -status post bronchoscopy on 11/8 and 11/12. -Unasyn 11/2-11/7.  Zosyn 11/9-11/12. Leukocytosis and procalcitonin improving.  Culture data as above.  -Continue bronchodilators, pulmonary hygiene, aspiration precaution and chest physiotherapy -Wean oxygen as able. -IV Solu-Medrol per PCCM.  We will clarify if this is still indicated. -Follow BAL culture  Quadriplegia after fall and spinal cord injury-had C5 vertebral body fracture, C3-C6 cord compression with edema status post C3-C6 laminectomy with fusion and repair of the dural tear in 04/2020 at [place] supportive care-with bolus tube feed, trach care... -PT/OT  Dysphagia: has PEG tube.  Patient had normal PEG tube barium study by PCCM -Continue bolus tube feeds with free water -Aspiration precautions  Hypernatremia: Resolved. -Continue free water flushes at 400 cc every 4 hours  Hypophosphatemia: P1.6. -Replace with potassium phosphate 45 mmol  Hypokalemia: Resolved.  Hyperglycemia without diagnosis of diabetes: -Check hemoglobin A1c  Bradycardia/hypotension: Neurogenic?  He seems to have PACs on telemetry.  Normotensive this morning. -Per PCCM, cardiology recommended considering  anticoagulation mainly due to risk for DVT/PE. -Continue midodrine and Florinef  Iron deficiency anemia: Hgb 8.6. (stable). -Received Venofer infusion 11/6 and 11/8 -Continue monitoring.   Leukocytosis/bandemia: WBC 14>11. Infectious?  Demargination? -Continue monitoring.  Goal of care: Overall, grim prognosis.  Palliative has been involved earler in the course. Still full code.  -Reconsult palliative care  Nutrition: Currently n.p.o. due to aspiration risk -Continue gentle IV fluid Body mass index is 28.94 kg/m. Nutrition Problem: Inadequate oral intake Etiology: inability to eat Signs/Symptoms: NPO status (reliance on tube feeds and free water per G-tube to meet calorie/protein/hydration needs.) Interventions: Tube feeding, Prostat, MVI   Sacral decubitus ulcer, stage II-POA.  Due to quadriplegia.   -Reposition every 2 hours -WOCN following. Pressure Injury 07/26/20 Sacrum Unstageable - Full thickness tissue loss in which the base of the injury is covered by slough (yellow, tan, gray, green or brown) and/or eschar (tan, brown or black) in the wound bed. (Active)  07/26/20 0513  Location: Sacrum  Location Orientation:   Staging: Unstageable - Full thickness tissue loss in which the base of the injury is covered by slough (yellow, tan, gray, green or brown) and/or eschar (tan, brown or black) in the wound bed.  Wound Description (Comments):   Present on Admission: Yes   DVT prophylaxis:  enoxaparin (LOVENOX) injection 40 mg Start: 07/26/20 1000 SCDs Start: 07/25/20 2243  Code Status: Full code Family Communication: Updated patient's wife over the phone on 11/11. Status is: Inpatient  Remains inpatient appropriate because:Unsafe d/c plan, IV treatments appropriate due to intensity of illness or inability to take PO and Inpatient level of care appropriate due to severity of illness   Dispo:  Patient From: Home  Planned Disposition: Home  Expected discharge date:  08/04/20  Medically stable for discharge: No        Consultants:  PCCM General surgery Palliative medicine   Sch Meds:  Scheduled Meds: . ascorbic acid  500 mg Per Tube BID  . baclofen  10 mg Per Tube TID  . bisacodyl  10 mg Rectal QHS  . chlorhexidine  15 mL Mouth Rinse BID  . Chlorhexidine Gluconate Cloth  6 each Topical Daily  . collagenase   Topical Daily  . enoxaparin (LOVENOX) injection  40 mg Subcutaneous Q24H  . famotidine  20 mg Per Tube Daily  . feeding supplement (OSMOLITE 1.5 CAL)  237 mL Per Tube Q3H  . feeding supplement (PROSource TF)  45 mL Per Tube BID  . ferrous sulfate  75 mg Per Tube Daily  . fludrocortisone  0.2 mg Per Tube Daily  . FLUoxetine  40 mg Per Tube Daily  . free water  400 mL Per Tube Q4H  . glycopyrrolate  1 mg Per Tube TID  . guaiFENesin  20 mL Per Tube Q6H  . lidocaine  1 patch Transdermal Q24H  . lidocaine  15 mL Mouth/Throat Once  . mouth rinse  15 mL Mouth Rinse q12n4p  . methylPREDNISolone (SOLU-MEDROL) injection  20 mg Intravenous Q12H  . midodrine  10 mg Per Tube TID WC  . multivitamin  5 mL Per Tube Daily  . nutrition supplement (JUVEN)  1 packet Per Tube BID BM  . polycarbophil  625 mg Per Tube Daily  . saccharomyces boulardii  250 mg Per Tube BID  .  scopolamine  1 patch Transdermal Q72H  . Simethicone  80 mg Per Tube QID  . traZODone  50 mg Per Tube QHS   Continuous Infusions: . sodium chloride Stopped (07/25/20 2347)  . potassium PHOSPHATE IVPB (in mmol) 45 mmol (08/05/20 1022)   PRN Meds:.acetaminophen, bisacodyl, hydrocortisone cream  Antimicrobials: Anti-infectives (From admission, onward)   Start     Dose/Rate Route Frequency Ordered Stop   08/01/20 1100  piperacillin-tazobactam (ZOSYN) IVPB 3.375 g  Status:  Discontinued        3.375 g 12.5 mL/hr over 240 Minutes Intravenous Every 8 hours 08/01/20 0915 08/04/20 1024   07/26/20 1600  Ampicillin-Sulbactam (UNASYN) 3 g in sodium chloride 0.9 % 100 mL IVPB         3 g 200 mL/hr over 30 Minutes Intravenous Every 6 hours 07/26/20 1130 07/30/20 2200   07/26/20 1200  vancomycin (VANCOCIN) IVPB 1000 mg/200 mL premix  Status:  Discontinued        1,000 mg 200 mL/hr over 60 Minutes Intravenous Every 12 hours 07/25/20 2304 07/26/20 1106   07/25/20 2315  vancomycin (VANCOREADY) IVPB 2000 mg/400 mL       "Followed by" Linked Group Details   2,000 mg 200 mL/hr over 120 Minutes Intravenous  Once 07/25/20 2301 07/26/20 0205   07/25/20 2315  vancomycin (VANCOREADY) IVPB 500 mg/100 mL       "Followed by" Linked Group Details   500 mg 100 mL/hr over 60 Minutes Intravenous  Once 07/25/20 2301 07/26/20 0323   07/25/20 1500  piperacillin-tazobactam (ZOSYN) IVPB 3.375 g  Status:  Discontinued        3.375 g 12.5 mL/hr over 240 Minutes Intravenous Every 8 hours 07/25/20 1446 07/26/20 1129   07/25/20 1430  Ampicillin-Sulbactam (UNASYN) 3 g in sodium chloride 0.9 % 100 mL IVPB  Status:  Discontinued        3 g 200 mL/hr over 30 Minutes Intravenous  Once 07/25/20 1418 07/25/20 1446       I have personally reviewed the following labs and images: CBC: Recent Labs  Lab 07/30/20 0343 07/31/20 0443 08/01/20 0002 08/01/20 0344 08/02/20 0634 08/03/20 0515 08/05/20 0739  WBC 8.9   < > 12.9* 12.5* 13.9* 11.5* 13.6*  NEUTROABS 5.9  --   --   --  12.5* 10.1* 11.1*  HGB 7.4*   < > 7.5* 7.6* 8.4* 8.6* 8.1*  HCT 24.3*   < > 24.4* 24.4* 27.7* 28.1* 25.7*  MCV 89.7   < > 90.7 90.7 90.2 89.2 87.7  PLT 315   < > 270 253 256 162 263   < > = values in this interval not displayed.   BMP &GFR Recent Labs  Lab 08/01/20 0002 08/01/20 0002 08/01/20 0344 08/01/20 0344 08/02/20 0723 08/03/20 0515 08/04/20 0405 08/04/20 2029 08/05/20 0739  NA 148*  --  148*  --  146* 142  --   --  135  K 3.6   < > 3.4*   < > 4.0 3.7 3.6 3.8 4.3  CL 108  --  110  --  109 106  --   --  100  CO2 29  --  29  --  27 28  --   --  28  GLUCOSE 156*  --  208*  --  174* 208*  --   --  228*  BUN  28*  --  27*  --  27* 29*  --   --  29*  CREATININE 0.93  --  0.95  --  0.70 0.72  --   --  0.59*  CALCIUM 9.2  --  9.3  --  9.5 9.7  --   --  8.7*  MG 2.5*  --   --   --  2.5* 2.6* 2.3  --  2.1  PHOS  --   --   --   --  3.6 2.4* 1.3* 1.6* 1.8*   < > = values in this interval not displayed.   Estimated Creatinine Clearance: 122.1 mL/min (A) (by C-G formula based on SCr of 0.59 mg/dL (L)). Liver & Pancreas: No results for input(s): AST, ALT, ALKPHOS, BILITOT, PROT, ALBUMIN in the last 168 hours. No results for input(s): LIPASE, AMYLASE in the last 168 hours. No results for input(s): AMMONIA in the last 168 hours. Diabetic: No results for input(s): HGBA1C in the last 72 hours. Recent Labs  Lab 08/02/20 0021  GLUCAP 129*   Cardiac Enzymes: No results for input(s): CKTOTAL, CKMB, CKMBINDEX, TROPONINI in the last 168 hours. No results for input(s): PROBNP in the last 8760 hours. Coagulation Profile: No results for input(s): INR, PROTIME in the last 168 hours. Thyroid Function Tests: No results for input(s): TSH, T4TOTAL, FREET4, T3FREE, THYROIDAB in the last 72 hours. Lipid Profile: No results for input(s): CHOL, HDL, LDLCALC, TRIG, CHOLHDL, LDLDIRECT in the last 72 hours. Anemia Panel: No results for input(s): VITAMINB12, FOLATE, FERRITIN, TIBC, IRON, RETICCTPCT in the last 72 hours. Urine analysis:    Component Value Date/Time   COLORURINE YELLOW 07/19/2020 1935   APPEARANCEUR CLOUDY (A) 07/19/2020 1935   APPEARANCEUR Clear 02/01/2020 0920   LABSPEC 1.012 07/19/2020 1935   PHURINE 5.0 07/19/2020 1935   GLUCOSEU NEGATIVE 07/19/2020 1935   HGBUR NEGATIVE 07/19/2020 1935   BILIRUBINUR NEGATIVE 07/19/2020 1935   BILIRUBINUR Negative 02/01/2020 0920   KETONESUR NEGATIVE 07/19/2020 1935   PROTEINUR NEGATIVE 07/19/2020 1935   NITRITE NEGATIVE 07/19/2020 1935   [place] (A) 07/19/2020 1935   Sepsis Labs: Invalid input(s): PROCALCITONIN,  [place]  Microbiology: Recent Results (from the past 240 hour(s))  Culture, bal-quantitative     Status: None   Collection Time: 07/31/20  7:40 PM   Specimen: Bronchoalveolar Lavage; Respiratory  Result Value Ref Range Status   Specimen Description   Final    BRONCHIAL ALVEOLAR LAVAGE Performed at [facility], [address] [place], [place] [address]    Special Requests NONE  Final   Gram Stain   Final    FEW WBC PRESENT, PREDOMINANTLY PMN NO ORGANISMS SEEN    Culture   Final    NO GROWTH 2 DAYS Performed at [facility], [place] [address] [place], [place] [address]    Report Status 08/03/2020 FINAL  Final  Culture, bal-quantitative     Status: None (Preliminary result)   Collection Time: 08/04/20  5:38 PM   Specimen: Bronchoalveolar Lavage; Respiratory  Result Value Ref Range Status   Specimen Description   Final    BRONCHIAL ALVEOLAR LAVAGE Performed at [facility], [address] [place], [place] [address]    Special Requests   Final    NONE Performed at [facility], [place], [place] [address]    Gram Stain NO WBC SEEN NO ORGANISMS SEEN   Final   Culture   Final    NO GROWTH < 12 HOURS Performed at [facility], [place] [address] [place], [place] [address]    Report Status PENDING  Incomplete  Radiology Studies: DG Chest Port 1 View  Result Date: 08/05/2020 CLINICAL DATA:  Cough and shortness of breath EXAM: PORTABLE CHEST 1 VIEW COMPARISON:  August 04, 2020 FINDINGS: Tracheostomy catheter tip is 6.0 cm above the carina. No pneumothorax. Questionable pneumomediastinum slightly to the right of midline there is bibasilar atelectasis. There is persistent airspace consolidation in portions of the right mid and lower lung regions. Heart is mildly enlarged, stable, with pulmonary vascularity within normal limits. No adenopathy. No bone lesions. IMPRESSION: Tracheostomy as described without pneumothorax.  Question mild pneumomediastinum, similar to 1 day prior. Persistent consolidation in portions of the right middle and lower lobes with bibasilar atelectasis. Stable cardiac prominence. Electronically Signed   By: Lowella Grip III M.D.   On: 08/05/2020 10:07    [address] [place]  If 7PM-7AM, please contact night-coverage [email or web address] 08/05/2020, 2:53 PM

## 2020-08-06 LAB — RENAL FUNCTION PANEL
Albumin: 2.1 g/dL — ABNORMAL LOW (ref 3.5–5.0)
Anion gap: 6 (ref 5–15)
BUN: 31 mg/dL — ABNORMAL HIGH (ref 8–23)
CO2: 27 mmol/L (ref 22–32)
Calcium: 8.6 mg/dL — ABNORMAL LOW (ref 8.9–10.3)
Chloride: 98 mmol/L (ref 98–111)
Creatinine, Ser: 0.53 mg/dL — ABNORMAL LOW (ref 0.61–1.24)
GFR, Estimated: >60 mL/min (ref >60)
Glucose, Bld: 97 mg/dL (ref 70–99)
Phosphorus: 2.1 mg/dL — ABNORMAL LOW (ref 2.5–4.6)
Potassium: 4.1 mmol/L (ref 3.5–5.1)
Sodium: 131 mmol/L — ABNORMAL LOW (ref 135–145)

## 2020-08-06 LAB — CBC WITH DIFFERENTIAL/PLATELET
Abs Immature Granulocytes: 0.36 10*3/uL — ABNORMAL HIGH (ref 0.00–0.07)
Basophils Absolute: 0 10*3/uL (ref 0.0–0.1)
Basophils Relative: 0 %
Eosinophils Absolute: 0.1 10*3/uL (ref 0.0–0.5)
Eosinophils Relative: 0 %
HCT: 27.1 % — ABNORMAL LOW (ref 39.0–52.0)
Hemoglobin: 8.9 g/dL — ABNORMAL LOW (ref 13.0–17.0)
Immature Granulocytes: 2 %
Lymphocytes Relative: 13 %
Lymphs Abs: 2 10*3/uL (ref 0.7–4.0)
MCH: 28.2 pg (ref 26.0–34.0)
MCHC: 32.8 g/dL (ref 30.0–36.0)
MCV: 85.8 fL (ref 80.0–100.0)
Monocytes Absolute: 1.4 10*3/uL — ABNORMAL HIGH (ref 0.1–1.0)
Monocytes Relative: 9 %
Neutro Abs: 11.9 10*3/uL — ABNORMAL HIGH (ref 1.7–7.7)
Neutrophils Relative %: 76 %
Platelets: 374 10*3/uL (ref 150–400)
RBC: 3.16 MIL/uL — ABNORMAL LOW (ref 4.22–5.81)
RDW: 17.2 % — ABNORMAL HIGH (ref 11.5–15.5)
WBC: 15.6 10*3/uL — ABNORMAL HIGH (ref 4.0–10.5)
nRBC: 0.7 % — ABNORMAL HIGH (ref 0.0–0.2)

## 2020-08-06 LAB — MAGNESIUM: Magnesium: 2.2 mg/dL (ref 1.7–2.4)

## 2020-08-06 MED ORDER — FREE WATER
300.0000 mL | Status: DC
  Administered 2020-08-06 – 2020-08-08 (×13): 300 mL

## 2020-08-06 NOTE — Progress Notes (Signed)
PROGRESS NOTE  Ryan Day DTH:[id number] DOB: 1950-11-20   PCP: Valerie Roys, DO  Patient is from: Home.   DOA: 07/25/2020 LOS: 12  Chief complaints: Shortness of breath  Brief Narrative / Interim history: 69 year old male with quadriplegia s/p fall in August 2021, trach and PEG dependence presenting with shortness of breath, cough with copious amount of sputum, and admitted for acute on chronic respiratory failure with hypoxia due to aspiration pneumonia and intermittent mucous plugging.   Patient continued to have intermittent tachypnea with right mucous plugging after a course of IV antibiotic for aspiration pneumonia.  He also spiked fever and restarted on Zosyn.  BAL culture NGTD.  On 11/11, CXR with RLL collapse.  Patient had repeat bronchoscopy on 11/12 had shown to confirm throughout from trachea down bilaterally, mucous plugging of superior segment of RLL and anterior basal segment of RLL aspirated and evacuated.  Subjective: Seen and examined earlier this morning.  No major events overnight of this morning.  No complaints.  He denies chest pain or shortness of breath.  Denies GI symptoms.  Objective: Vitals:   08/06/20 0026 08/06/20 0500 08/06/20 0847 08/06/20 1229  BP: (!) 116/59 110/62 (!) 111/58 (!) 95/53  Pulse: 72 66 67 78  Resp: _0 Temp: 98.2 F (36.8 C) 98.1 F (36.7 C) 98.2 F (36.8 C) 98 F (36.7 C)  TempSrc: Oral Axillary Oral Oral  SpO2: 100% 100% 100% 100%  Weight:      Height:        Intake/Output Summary (Last 24 hours) at 08/06/2020 1338 Last data filed at 08/06/2020 1100 Gross per 24 hour  Intake 2928.95 ml  Output 4700 ml  Net -1771.05 ml   Filed Weights   08/03/20 0500 08/04/20 0346 08/05/20 0409  Weight: 100.1 kg 107.8 kg 110.7 kg    Examination:  GENERAL: No apparent distress.  Nontoxic. HEENT: MMM.  Vision and hearing grossly intact.  NECK: Tracheostomy in place. RESP: On trach collar.  No IWOB.  Coarse breath sounds  bilaterally. CVS:  RRR. Heart sounds normal.  ABD/GI/GU: BS+. Abd soft, NTND.  PEG and rectal tube in place. MSK/EXT: Quadriplegia. SKIN: no apparent skin lesion or wound NEURO: Awake and alert.  Fairly oriented.  CN grossly intact.  Quadriplegia. PSYCH: Calm. Normal affect.  Procedures:  11/8-bronchoscopy 11/12-bronchoscopy  Microbiology summarized: Limited RVP panel nonreactive. MRSA PCR negative. 11/2-Rattray culture with normal flora 11/2-blood cultures NGTD. 11/8-BAL NGTD. 11/12-BAL culture pending.  Assessment & Plan: Acute on chronic hypoxemic respiratory failure due to recurrent aspiration pneumonia and recurrent mucous plugging and patient with tracheostomy and PEG tube after spinal cord injury- -status post bronchoscopy on 11/8 and 11/12. -Unasyn 11/2-11/7.  Zosyn 11/9-11/12. Leukocytosis and procalcitonin improving.  Culture data as above.  -Continue bronchodilators, pulmonary hygiene, aspiration precaution and chest physiotherapy -Wean oxygen as able. -Follow BAL culture -Likely home on 08/07/2020.  Quadriplegia after fall and spinal cord injury-had C5 vertebral body fracture, C3-C6 cord compression with edema status post C3-C6 laminectomy with fusion and repair of the dural tear in 04/2020 at [facility] supportive care-with bolus tube feed, trach care... -PT/OT  Dysphagia: has PEG tube.  Patient had normal PEG tube barium study by PCCM -Continue bolus tube feeds with free water -Aspiration precautions  Hypernatremia: Resolved.  Hyponatremia: Na 131.  Due to free water? -Decrease free water to 300 cc every 4 hours. -Continue Florinef. -Recheck in the morning.  Hypophosphatemia: P 2.1. -Recheck in the morning.  Hypokalemia: Resolved.  Hyperglycemia without diagnosis of diabetes: Euglycemic today. -Check hemoglobin A1c  Bradycardia/hypotension: Neurogenic?  He seems to have PACs on telemetry.  Normotensive this morning. -Per PCCM, cardiology  recommended considering anticoagulation mainly due to risk for DVT/PE. -Continue midodrine and Florinef  Iron deficiency anemia: Hgb 8.6. (stable). -Received Venofer infusion 11/6 and 11/8 -Continue monitoring.   Leukocytosis/bandemia: WBC 14>11> 15.6.  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 -Continue monitoring.  Goal of care: Overall, grim prognosis.  Palliative has been involved earler in the course. Still full code.  -Reconsult palliative care  Nutrition: Currently n.p.o. due to aspiration risk -Continue gentle IV fluid Body mass index is 28.94 kg/m. Nutrition Problem: Inadequate oral intake Etiology: inability to eat Signs/Symptoms: NPO status (reliance on tube feeds and free water per G-tube to meet calorie/protein/hydration needs.) Interventions: Tube feeding, Prostat, MVI   Sacral decubitus ulcer, stage II-POA.  Due to quadriplegia.   -Reposition every 2 hours -WOCN following. Pressure Injury 07/26/20 Sacrum Unstageable - Full thickness tissue loss in which the base of the injury is covered by slough (yellow, tan, gray, green or brown) and/or eschar (tan, brown or black) in the wound bed. (Active)  07/26/20 0513  Location: Sacrum  Location Orientation:   Staging: Unstageable - Full thickness tissue loss in which the base of the injury is covered by slough (yellow, tan, gray, green or brown) and/or eschar (tan, brown or black) in the wound bed.  Wound Description (Comments):   Present on Admission: Yes   DVT prophylaxis:  enoxaparin (LOVENOX) injection 40 mg Start: 07/26/20 1000 SCDs Start: 07/25/20 2243  Code Status: Full code Family Communication: Updated patient's wife over the phone.  Told her to anticipate discharge on 08/07/2020. Status is: Inpatient  Remains inpatient appropriate because:Persistent severe electrolyte disturbances, Unsafe d/c plan and Inpatient level of care appropriate due to severity of illness   Dispo:  Patient From:  Home  Planned Disposition: Home  Expected discharge date: 08/07/20  Medically stable for discharge: No        Consultants:  PCCM General surgery Palliative medicine   Sch Meds:  Scheduled Meds:  ascorbic acid  500 mg Per Tube BID   baclofen  10 mg Per Tube TID   bisacodyl  10 mg Rectal QHS   chlorhexidine  15 mL Mouth Rinse BID   Chlorhexidine Gluconate Cloth  6 each Topical Daily   collagenase   Topical Daily   enoxaparin (LOVENOX) injection  40 mg Subcutaneous Q24H   famotidine  20 mg Per Tube Daily   feeding supplement (OSMOLITE 1.5 CAL)  237 mL Per Tube Q3H   feeding supplement (PROSource TF)  45 mL Per Tube BID   ferrous sulfate  75 mg Per Tube Daily   fludrocortisone  0.2 mg Per Tube Daily   FLUoxetine  40 mg Per Tube Daily   free water  400 mL Per Tube Q4H   glycopyrrolate  1 mg Per Tube TID   guaiFENesin  20 mL Per Tube Q6H   lidocaine  1 patch Transdermal Q24H   lidocaine  15 mL Mouth/Throat Once   mouth rinse  15 mL Mouth Rinse q12n4p   methylPREDNISolone (SOLU-MEDROL) injection  20 mg Intravenous Q12H   midodrine  10 mg Per Tube TID WC   multivitamin  5 mL Per Tube Daily   nutrition supplement (JUVEN)  1 packet Per Tube BID BM   polycarbophil  625 mg Per Tube Daily   saccharomyces boulardii  250 mg Per Tube BID  scopolamine  1 patch Transdermal Q72H   Simethicone  80 mg Per Tube QID   traZODone  50 mg Per Tube QHS   Continuous Infusions:  sodium chloride Stopped (07/25/20 2347)   PRN Meds:.acetaminophen, bisacodyl, hydrocortisone cream  Antimicrobials: Anti-infectives (From admission, onward)   Start     Dose/Rate Route Frequency Ordered Stop   08/01/20 1100  piperacillin-tazobactam (ZOSYN) IVPB 3.375 g  Status:  Discontinued        3.375 g 12.5 mL/hr over 240 Minutes Intravenous Every 8 hours 08/01/20 0915 08/04/20 1024   07/26/20 1600  Ampicillin-Sulbactam (UNASYN) 3 g in sodium chloride 0.9 % 100 mL IVPB        3  g 200 mL/hr over 30 Minutes Intravenous Every 6 hours 07/26/20 1130 07/30/20 2200   07/26/20 1200  vancomycin (VANCOCIN) IVPB 1000 mg/200 mL premix  Status:  Discontinued        1,000 mg 200 mL/hr over 60 Minutes Intravenous Every 12 hours 07/25/20 2304 07/26/20 1106   07/25/20 2315  vancomycin (VANCOREADY) IVPB 2000 mg/400 mL       "Followed by" Linked Group Details   2,000 mg 200 mL/hr over 120 Minutes Intravenous  Once 07/25/20 2301 07/26/20 0205   07/25/20 2315  vancomycin (VANCOREADY) IVPB 500 mg/100 mL       "Followed by" Linked Group Details   500 mg 100 mL/hr over 60 Minutes Intravenous  Once 07/25/20 2301 07/26/20 0323   07/25/20 1500  piperacillin-tazobactam (ZOSYN) IVPB 3.375 g  Status:  Discontinued        3.375 g 12.5 mL/hr over 240 Minutes Intravenous Every 8 hours 07/25/20 1446 07/26/20 1129   07/25/20 1430  Ampicillin-Sulbactam (UNASYN) 3 g in sodium chloride 0.9 % 100 mL IVPB  Status:  Discontinued        3 g 200 mL/hr over 30 Minutes Intravenous  Once 07/25/20 1418 07/25/20 1446       I have personally reviewed the following labs and images: CBC: Recent Labs  Lab 08/01/20 0344 08/02/20 0634 08/03/20 0515 08/05/20 0739 08/06/20 0012  WBC 12.5* 13.9* 11.5* 13.6* 15.6*  NEUTROABS  --  12.5* 10.1* 11.1* 11.9*  HGB 7.6* 8.4* 8.6* 8.1* 8.9*  HCT 24.4* 27.7* 28.1* 25.7* 27.1*  MCV 90.7 90.2 89.2 87.7 85.8  PLT 253 256 162 263 374   BMP &GFR Recent Labs  Lab 08/01/20 0002 08/01/20 0344 08/02/20 0723 08/02/20 0723 08/03/20 0515 08/04/20 0405 08/04/20 2029 08/05/20 0739 08/06/20 0012  NA  --  148* 146*  --  142  --   --  135 131*  K   < > 3.4* 4.0   < > 3.7 3.6 3.8 4.3 4.1  CL  --  110 109  --  106  --   --  100 98  CO2  --  29 27  --  28  --   --  28 27  GLUCOSE  --  208* 174*  --  208*  --   --  228* 97  BUN  --  27* 27*  --  29*  --   --  29* 31*  CREATININE  --  0.95 0.70  --  0.72  --   --  0.59* 0.53*  CALCIUM  --  9.3 9.5  --  9.7  --   --   8.7* 8.6*  MG   < >  --  2.5*  --  2.6* 2.3  --  2.1 2.2  PHOS  --   --  3.6   < > 2.4* 1.3* 1.6* 1.8* 2.1*   < > = values in this interval not displayed.   Estimated Creatinine Clearance: 122.1 mL/min (A) (by C-G formula based on SCr of 0.53 mg/dL (L)). Liver & Pancreas: Recent Labs  Lab 08/06/20 0012  ALBUMIN 2.1*   No results for input(s): LIPASE, AMYLASE in the last 168 hours. No results for input(s): AMMONIA in the last 168 hours. Diabetic: No results for input(s): HGBA1C in the last 72 hours. Recent Labs  Lab 08/02/20 0021 08/05/20 1644 08/05/20 2104  GLUCAP 129* 205* 134*   Cardiac Enzymes: No results for input(s): CKTOTAL, CKMB, CKMBINDEX, TROPONINI in the last 168 hours. No results for input(s): PROBNP in the last 8760 hours. Coagulation Profile: No results for input(s): INR, PROTIME in the last 168 hours. Thyroid Function Tests: No results for input(s): TSH, T4TOTAL, FREET4, T3FREE, THYROIDAB in the last 72 hours. Lipid Profile: No results for input(s): CHOL, HDL, LDLCALC, TRIG, CHOLHDL, LDLDIRECT in the last 72 hours. Anemia Panel: No results for input(s): VITAMINB12, FOLATE, FERRITIN, TIBC, IRON, RETICCTPCT in the last 72 hours. Urine analysis:    Component Value Date/Time   COLORURINE YELLOW 07/19/2020 1935   APPEARANCEUR CLOUDY (A) 07/19/2020 1935   APPEARANCEUR Clear 02/01/2020 0920   LABSPEC 1.012 07/19/2020 1935   PHURINE 5.0 07/19/2020 1935   GLUCOSEU NEGATIVE 07/19/2020 1935   HGBUR NEGATIVE 07/19/2020 1935   BILIRUBINUR NEGATIVE 07/19/2020 1935   BILIRUBINUR Negative 02/01/2020 0920   KETONESUR NEGATIVE 07/19/2020 1935   PROTEINUR NEGATIVE 07/19/2020 1935   NITRITE NEGATIVE 07/19/2020 1935   [place] (A) 07/19/2020 1935   Sepsis Labs: Invalid input(s): PROCALCITONIN, [place]  Microbiology: Recent Results (from the past 240 hour(s))  Culture, bal-quantitative     Status: None   Collection Time: 07/31/20  7:40 PM   Specimen:  Bronchoalveolar Lavage; Respiratory  Result Value Ref Range Status   Specimen Description   Final    BRONCHIAL ALVEOLAR LAVAGE Performed at [facility], [address] [place], [place] [address]    Special Requests NONE  Final   Gram Stain   Final    FEW WBC PRESENT, PREDOMINANTLY PMN NO ORGANISMS SEEN    Culture   Final    NO GROWTH 2 DAYS Performed at [facility], [place] [address], [place], [place] [address]    Report Status 08/03/2020 FINAL  Final  Culture, bal-quantitative     Status: None (Preliminary result)   Collection Time: 08/04/20  5:38 PM   Specimen: Bronchoalveolar Lavage; Respiratory  Result Value Ref Range Status   Specimen Description   Final    BRONCHIAL ALVEOLAR LAVAGE Performed at [facility], [address] [place], [place] [address]    Special Requests   Final    NONE Performed at [facility], [address] [place], [place] [address]    Gram Stain NO WBC SEEN NO ORGANISMS SEEN   Final   Culture   Final    CULTURE REINCUBATED FOR BETTER GROWTH Performed at [facility], [place] [address] [place], [place] [address]    Report Status PENDING  Incomplete    Radiology Studies: No results found.  Emori Mumme [place]  If 7PM-7AM, please contact night-coverage [email or web address] 08/06/2020, 1:38 PM

## 2020-08-07 LAB — CBC WITH DIFFERENTIAL/PLATELET
Abs Immature Granulocytes: 0.41 10*3/uL — ABNORMAL HIGH (ref 0.00–0.07)
Basophils Absolute: 0 10*3/uL (ref 0.0–0.1)
Basophils Relative: 0 %
Eosinophils Absolute: 0 10*3/uL (ref 0.0–0.5)
Eosinophils Relative: 0 %
HCT: 27.2 % — ABNORMAL LOW (ref 39.0–52.0)
Hemoglobin: 8.7 g/dL — ABNORMAL LOW (ref 13.0–17.0)
Immature Granulocytes: 3 %
Lymphocytes Relative: 10 %
Lymphs Abs: 1.5 10*3/uL (ref 0.7–4.0)
MCH: 27.7 pg (ref 26.0–34.0)
MCHC: 32 g/dL (ref 30.0–36.0)
MCV: 86.6 fL (ref 80.0–100.0)
Monocytes Absolute: 0.9 10*3/uL (ref 0.1–1.0)
Monocytes Relative: 6 %
Neutro Abs: 12.1 10*3/uL — ABNORMAL HIGH (ref 1.7–7.7)
Neutrophils Relative %: 81 %
Platelets: 382 10*3/uL (ref 150–400)
RBC: 3.14 MIL/uL — ABNORMAL LOW (ref 4.22–5.81)
RDW: 17.9 % — ABNORMAL HIGH (ref 11.5–15.5)
WBC: 15.1 10*3/uL — ABNORMAL HIGH (ref 4.0–10.5)
nRBC: 0.2 % (ref 0.0–0.2)

## 2020-08-07 LAB — RENAL FUNCTION PANEL
Albumin: 2.1 g/dL — ABNORMAL LOW (ref 3.5–5.0)
Anion gap: 7 (ref 5–15)
BUN: 29 mg/dL — ABNORMAL HIGH (ref 8–23)
CO2: 28 mmol/L (ref 22–32)
Calcium: 8.6 mg/dL — ABNORMAL LOW (ref 8.9–10.3)
Chloride: 100 mmol/L (ref 98–111)
Creatinine, Ser: 0.66 mg/dL (ref 0.61–1.24)
GFR, Estimated: >60 mL/min (ref >60)
Glucose, Bld: 117 mg/dL — ABNORMAL HIGH (ref 70–99)
Phosphorus: 2.9 mg/dL (ref 2.5–4.6)
Potassium: 4.9 mmol/L (ref 3.5–5.1)
Sodium: 135 mmol/L (ref 135–145)

## 2020-08-07 LAB — MAGNESIUM: Magnesium: 2.2 mg/dL (ref 1.7–2.4)

## 2020-08-07 MED ORDER — RIVAROXABAN 10 MG PO TABS
10.0000 mg | ORAL_TABLET | Freq: Every day | ORAL | Status: DC
  Administered 2020-08-08: 10:00:00 10 mg
  Filled 2020-08-07: qty 1

## 2020-08-07 NOTE — Care Management Important Message (Signed)
Important Message  Patient Details  Name: Ryan Day MRN: [id number] Date of Birth: [birth year]   Medicare Important Message Given:  Yes     Juliann Pulse A Reginaldo Hazard 08/07/2020, 11:48 AM

## 2020-08-07 NOTE — TOC Progression Note (Signed)
Transition of Care [facility]) - Progression Note    Patient Details  Name: Ryan Day MRN: [id number] Date of Birth: 02-Aug-1951  [facility]) CM/SW Contact  Shelbie Hutching, RN Phone Number: 08/07/2020, 4:22 PM  Clinical Narrative:    Potential discharge for tomorrow for patient to go home.  Patient will be followed by Palliative services and not yet hospice care.  Patient still wants to live and do everything that can be done to live.  Patient will need oxygen at discharge, referral has been given to [facility] with [employer].  Patient will be fitted with an Afflow vest once he is at home.  Patient will require EMS transport home.   Expected Discharge Plan: Home w Hospice Care Barriers to Discharge: Continued Medical Work up  Expected Discharge Plan and Services Expected Discharge Plan: [place]   Discharge Planning Services: CM Consult Post Acute Care Choice: Hospice Living arrangements for the past 2 months: Single Family Home                 DME Arranged: Oxygen DME Agency: [employer]       HH Arranged: NA           Social Determinants of Health (SDOH) Interventions    Readmission Risk Interventions No flowsheet data found.

## 2020-08-07 NOTE — TOC Progression Note (Signed)
Transition of Care [facility]) - Progression Note    Patient Details  Name: Ryan Day MRN: [id number] Date of Birth: 03-May-1951  [facility]) CM/SW Contact  Shelbie Hutching, RN Phone Number: 08/07/2020, 10:45 AM  Clinical Narrative:    RNCM was able to speak with patient's wife at the bedside this morning per her request.  [name], wife, reports that she is interested in hospice for her husband.  RNCM clarified that it is hospice that they are interested in and wife repeats yes, hospice.  Choice of hospice agency offered and wife chooses [facility].  Palliative care at the hospital has been following, RNCM has asked that they return and speak with the patient and wife to ensure that they have a full understanding of what hospice means.  Patient is currently getting bolus tube feeds and is planned for aflow vest fitting at home once discharged- these things will likely not be covered by hospice services.   Flo Shanks with [facility] [place] given home with hospice referral.  Patient has a hospital bed, wheelchair, hoyer lift, trach supplies, and suction at home.  Patient will need EMS for transport and patient will also need oxygen set up for home.    Expected Discharge Plan: Home w Hospice Care Barriers to Discharge: Continued Medical Work up  Expected Discharge Plan and Services Expected Discharge Plan: [place]   Discharge Planning Services: CM Consult Post Acute Care Choice: Hospice Living arrangements for the past 2 months: Single Family Home                 DME Arranged: Oxygen DME Agency: [employer]       HH Arranged: NA           Social Determinants of Health (SDOH) Interventions    Readmission Risk Interventions No flowsheet data found.

## 2020-08-07 NOTE — Progress Notes (Signed)
PROGRESS NOTE  Ryan Day JIR:[id number] DOB: 09-08-1951   PCP: Valerie Roys, DO  Patient is from: Home.   DOA: 07/25/2020 LOS: 71  Chief complaints: Shortness of breath  Brief Narrative / Interim history: 69 year old male with quadriplegia s/p fall in August 2021, trach and PEG dependence presenting with shortness of breath, cough with copious amount of sputum, and admitted for acute on chronic respiratory failure with hypoxia due to aspiration pneumonia and intermittent mucous plugging.   Patient continued to have intermittent tachypnea with right mucous plugging after a course of IV antibiotic for aspiration pneumonia.  He also spiked fever and restarted on Zosyn.  BAL culture NGTD.  On 11/11, CXR with RLL collapse.  Patient had repeat bronchoscopy on 11/12 had shown to confirm throughout from trachea down bilaterally, mucous plugging of superior segment of RLL and anterior basal segment of RLL aspirated and evacuated.  Anticipated discharge on 08/08/2020 with home health, OT and possibly oxygen.  Subjective: Seen and examined earlier this morning.  No major events overnight or this morning.  Responds to question by nodding.  Denies chest pain, shortness of breath or abdominal pain.  Wife at bedside.  Asked to meet with palliative medicine.  Also wondering if he needs home oxygen.  Objective: Vitals:   08/07/20 0749 08/07/20 0753 08/07/20 1200 08/07/20 1234  BP: 112/65  122/67   Pulse: 93  77   Resp: 16  (!) 22   Temp: (!) 97.5 F (36.4 C)  98.6 F (37 C)   TempSrc: Oral  Oral   SpO2: 95% 95% 99% 97%  Weight:      Height:        Intake/Output Summary (Last 24 hours) at 08/07/2020 1436 Last data filed at 08/07/2020 1200 Gross per 24 hour  Intake 1674 ml  Output 4950 ml  Net -3276 ml   Filed Weights   08/03/20 0500 08/04/20 0346 08/05/20 0409  Weight: 100.1 kg 107.8 kg 110.7 kg    Examination:  GENERAL: No apparent distress.  Nontoxic. HEENT: MMM.  Vision and  hearing grossly intact.  NECK: Tracheostomy in place. RESP: 97% on 2 L / 35% FiO2 via trach.  No IWOB.  Coarse breath sounds bilaterally. CVS:  RRR. Heart sounds normal.  ABD/GI/GU: BS+. Abd soft, NTND.  PEG tube and rectal tube in place. MSK/EXT: Quadriplegia. SKIN: no apparent skin lesion or wound NEURO: Awake and alert.  Seems to be fairly oriented but difficult to assess.  Quadriplegia. PSYCH: Calm. Normal affect.  Procedures:  11/8-bronchoscopy 11/12-bronchoscopy  Microbiology summarized: Limited RVP panel nonreactive. MRSA PCR negative. 11/2-Rattray culture with normal flora 11/2-blood cultures NGTD. 11/8-BAL NGTD. 11/12-BAL culture pending.  Assessment & Plan: Acute on chronic hypoxemic respiratory failure due to recurrent aspiration pneumonia and recurrent mucous plugging and patient with tracheostomy and PEG tube after spinal cord injury- -status post bronchoscopy on 11/8 and 11/12. -Unasyn 11/2-11/7.  Zosyn 11/9-11/12. Leukocytosis and procalcitonin improving.  Culture data as above.  -Continue bronchodilators, pulmonary hygiene, aspiration precaution and chest physiotherapy -Wean oxygen as able.  May need home oxygen. -Follow BAL culture -Likely home with home oxygen on 08/08/2020. -TOC to arrange home oxygen.  Quadriplegia after fall and spinal cord injury-had C5 vertebral body fracture, C3-C6 cord compression with edema status post C3-C6 laminectomy with fusion and repair of the dural tear in 04/2020 at [facility]-with bolus tube feed, trach care... -Will restart low-dose Xarelto for VTE prophylaxis -PT/OT  Dysphagia: has PEG tube.  Patient had  normal PEG tube barium study by PCCM -Continue bolus tube feeds with free water -Aspiration precautions  Hypernatremia: Resolved.  Hyponatremia: Resolved. -Decreased free water to 300 cc every 4 hours. -Continue Florinef.  Hypophosphatemia: Resolved. -Recheck in the morning.  Hypokalemia:  Resolved.  Hyperglycemia without diagnosis of diabetes: Likely due to dextrose.  Improved. -Check hemoglobin A1c  Bradycardia/hypotension: Seems to be within normal today. -Per PCCM, cardiology recommended considering anticoagulation mainly due to risk for DVT/PE. -Continue midodrine and Florinef  Iron deficiency anemia: Hgb 8.6. (stable). -Received Venofer infusion 11/6 and 11/8 -Continue monitoring.   Leukocytosis/bandemia: WBC 14>11> 15.6> 10.1.  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 -Continue monitoring.  Goal of care: Overall, grim prognosis.  Palliative has been involved earler in the course. Still full code.  -Reconsulted palliative care-recommended outpatient follow-up  Nutrition: Currently n.p.o. due to aspiration risk -Continue gentle IV fluid Body mass index is 28.94 kg/m. Nutrition Problem: Inadequate oral intake Etiology: inability to eat Signs/Symptoms: NPO status (reliance on tube feeds and free water per G-tube to meet calorie/protein/hydration needs.) Interventions: Tube feeding, Prostat, MVI   Sacral decubitus ulcer, stage II-POA.  Due to quadriplegia.   -Reposition every 2 hours -WOCN following. Pressure Injury 07/26/20 Sacrum Unstageable - Full thickness tissue loss in which the base of the injury is covered by slough (yellow, tan, gray, green or brown) and/or eschar (tan, brown or black) in the wound bed. (Active)  07/26/20 0513  Location: Sacrum  Location Orientation:   Staging: Unstageable - Full thickness tissue loss in which the base of the injury is covered by slough (yellow, tan, gray, green or brown) and/or eschar (tan, brown or black) in the wound bed.  Wound Description (Comments):   Present on Admission: Yes   DVT prophylaxis:  enoxaparin (LOVENOX) injection 40 mg Start: 07/26/20 1000 SCDs Start: 07/25/20 2243  Code Status: Full code Family Communication: Updated patient's wife over the phone.  Told her to anticipate discharge on  08/07/2020. Status is: Inpatient  Remains inpatient appropriate because:Unsafe d/c plan and Inpatient level of care appropriate due to severity of illness   Dispo:  Patient From: Home  Planned Disposition: To be determined  Expected discharge date: 08/08/20  Medically stable for discharge: No        Consultants:  PCCM-signed off. General surgery Palliative medicine   Sch Meds:  Scheduled Meds: . ascorbic acid  500 mg Per Tube BID  . baclofen  10 mg Per Tube TID  . bisacodyl  10 mg Rectal QHS  . chlorhexidine  15 mL Mouth Rinse BID  . Chlorhexidine Gluconate Cloth  6 each Topical Daily  . collagenase   Topical Daily  . enoxaparin (LOVENOX) injection  40 mg Subcutaneous Q24H  . famotidine  20 mg Per Tube Daily  . feeding supplement (OSMOLITE 1.5 CAL)  237 mL Per Tube Q3H  . feeding supplement (PROSource TF)  45 mL Per Tube BID  . ferrous sulfate  75 mg Per Tube Daily  . fludrocortisone  0.2 mg Per Tube Daily  . FLUoxetine  40 mg Per Tube Daily  . free water  300 mL Per Tube Q4H  . glycopyrrolate  1 mg Per Tube TID  . guaiFENesin  20 mL Per Tube Q6H  . lidocaine  1 patch Transdermal Q24H  . lidocaine  15 mL Mouth/Throat Once  . mouth rinse  15 mL Mouth Rinse q12n4p  . methylPREDNISolone (SOLU-MEDROL) injection  20 mg Intravenous Q12H  . midodrine  10 mg Per  Tube TID WC  . multivitamin  5 mL Per Tube Daily  . nutrition supplement (JUVEN)  1 packet Per Tube BID BM  . polycarbophil  625 mg Per Tube Daily  . saccharomyces boulardii  250 mg Per Tube BID  . scopolamine  1 patch Transdermal Q72H  . Simethicone  80 mg Per Tube QID  . traZODone  50 mg Per Tube QHS   Continuous Infusions: . sodium chloride Stopped (07/25/20 2347)   PRN Meds:.acetaminophen, bisacodyl, hydrocortisone cream  Antimicrobials: Anti-infectives (From admission, onward)   Start     Dose/Rate Route Frequency Ordered Stop   08/01/20 1100  piperacillin-tazobactam (ZOSYN) IVPB 3.375 g  Status:   Discontinued        3.375 g 12.5 mL/hr over 240 Minutes Intravenous Every 8 hours 08/01/20 0915 08/04/20 1024   07/26/20 1600  Ampicillin-Sulbactam (UNASYN) 3 g in sodium chloride 0.9 % 100 mL IVPB        3 g 200 mL/hr over 30 Minutes Intravenous Every 6 hours 07/26/20 1130 07/30/20 2200   07/26/20 1200  vancomycin (VANCOCIN) IVPB 1000 mg/200 mL premix  Status:  Discontinued        1,000 mg 200 mL/hr over 60 Minutes Intravenous Every 12 hours 07/25/20 2304 07/26/20 1106   07/25/20 2315  vancomycin (VANCOREADY) IVPB 2000 mg/400 mL       "Followed by" Linked Group Details   2,000 mg 200 mL/hr over 120 Minutes Intravenous  Once 07/25/20 2301 07/26/20 0205   07/25/20 2315  vancomycin (VANCOREADY) IVPB 500 mg/100 mL       "Followed by" Linked Group Details   500 mg 100 mL/hr over 60 Minutes Intravenous  Once 07/25/20 2301 07/26/20 0323   07/25/20 1500  piperacillin-tazobactam (ZOSYN) IVPB 3.375 g  Status:  Discontinued        3.375 g 12.5 mL/hr over 240 Minutes Intravenous Every 8 hours 07/25/20 1446 07/26/20 1129   07/25/20 1430  Ampicillin-Sulbactam (UNASYN) 3 g in sodium chloride 0.9 % 100 mL IVPB  Status:  Discontinued        3 g 200 mL/hr over 30 Minutes Intravenous  Once 07/25/20 1418 07/25/20 1446       I have personally reviewed the following labs and images: CBC: Recent Labs  Lab 08/02/20 0634 08/03/20 0515 08/05/20 0739 08/06/20 0012 08/07/20 0545  WBC 13.9* 11.5* 13.6* 15.6* 15.1*  NEUTROABS 12.5* 10.1* 11.1* 11.9* 12.1*  HGB 8.4* 8.6* 8.1* 8.9* 8.7*  HCT 27.7* 28.1* 25.7* 27.1* 27.2*  MCV 90.2 89.2 87.7 85.8 86.6  PLT 256 162 263 374 382   BMP &GFR Recent Labs  Lab 08/02/20 0723 08/02/20 0723 08/03/20 0515 08/03/20 0515 08/04/20 0405 08/04/20 2029 08/05/20 0739 08/06/20 0012 08/07/20 0545  NA 146*  --  142  --   --   --  135 131* 135  K 4.0   < > 3.7   < > 3.6 3.8 4.3 4.1 4.9  CL 109  --  106  --   --   --  100 98 100  CO2 27  --  28  --   --   --  _0 GLUCOSE 174*  --  208*  --   --   --  228* 97 117*  BUN 27*  --  29*  --   --   --  29* 31* 29*  CREATININE 0.70  --  0.72  --   --   --  0.59*  0.53* 0.66  CALCIUM 9.5  --  9.7  --   --   --  8.7* 8.6* 8.6*  MG 2.5*   < > 2.6*  --  2.3  --  2.1 2.2 2.2  PHOS 3.6   < > 2.4*   < > 1.3* 1.6* 1.8* 2.1* 2.9   < > = values in this interval not displayed.   Estimated Creatinine Clearance: 122.1 mL/min (by C-G formula based on SCr of 0.66 mg/dL). Liver & Pancreas: Recent Labs  Lab 08/06/20 0012 08/07/20 0545  ALBUMIN 2.1* 2.1*   No results for input(s): LIPASE, AMYLASE in the last 168 hours. No results for input(s): AMMONIA in the last 168 hours. Diabetic: No results for input(s): HGBA1C in the last 72 hours. Recent Labs  Lab 08/02/20 0021 08/05/20 1644 08/05/20 2104  GLUCAP 129* 205* 134*   Cardiac Enzymes: No results for input(s): CKTOTAL, CKMB, CKMBINDEX, TROPONINI in the last 168 hours. No results for input(s): PROBNP in the last 8760 hours. Coagulation Profile: No results for input(s): INR, PROTIME in the last 168 hours. Thyroid Function Tests: No results for input(s): TSH, T4TOTAL, FREET4, T3FREE, THYROIDAB in the last 72 hours. Lipid Profile: No results for input(s): CHOL, HDL, LDLCALC, TRIG, CHOLHDL, LDLDIRECT in the last 72 hours. Anemia Panel: No results for input(s): VITAMINB12, FOLATE, FERRITIN, TIBC, IRON, RETICCTPCT in the last 72 hours. Urine analysis:    Component Value Date/Time   COLORURINE YELLOW 07/19/2020 1935   APPEARANCEUR CLOUDY (A) 07/19/2020 1935   APPEARANCEUR Clear 02/01/2020 0920   LABSPEC 1.012 07/19/2020 1935   PHURINE 5.0 07/19/2020 1935   GLUCOSEU NEGATIVE 07/19/2020 1935   HGBUR NEGATIVE 07/19/2020 1935   BILIRUBINUR NEGATIVE 07/19/2020 1935   BILIRUBINUR Negative 02/01/2020 0920   KETONESUR NEGATIVE 07/19/2020 1935   PROTEINUR NEGATIVE 07/19/2020 1935   NITRITE NEGATIVE 07/19/2020 1935   [place] (A) 07/19/2020 1935    Sepsis Labs: Invalid input(s): PROCALCITONIN, [place]  Microbiology: Recent Results (from the past 240 hour(s))  Culture, bal-quantitative     Status: None   Collection Time: 07/31/20  7:40 PM   Specimen: Bronchoalveolar Lavage; Respiratory  Result Value Ref Range Status   Specimen Description   Final    BRONCHIAL ALVEOLAR LAVAGE Performed at [facility], [address] [place], [place] [address]    Special Requests NONE  Final   Gram Stain   Final    FEW WBC PRESENT, PREDOMINANTLY PMN NO ORGANISMS SEEN    Culture   Final    NO GROWTH 2 DAYS Performed at [facility], [place] [address] [place], [place] [address]    Report Status 08/03/2020 FINAL  Final  Culture, bal-quantitative     Status: None (Preliminary result)   Collection Time: 08/04/20  5:38 PM   Specimen: Bronchoalveolar Lavage; Respiratory  Result Value Ref Range Status   Specimen Description   Final    BRONCHIAL ALVEOLAR LAVAGE Performed at [facility], [address] [place], [place] [address]    Special Requests   Final    NONE Performed at [facility], [address] [place], [place] [address]    Gram Stain NO WBC SEEN NO ORGANISMS SEEN   Final   Culture   Final    CULTURE REINCUBATED FOR BETTER GROWTH Performed at [facility], [place] [address], [place],  [address]    Report Status PENDING  Incomplete    Radiology Studies: No results found.  Natalie Mceuen T.  [place]  If 7PM-7AM, please contact night-coverage [email or web address] 08/07/2020, 2:36 PM

## 2020-08-07 NOTE — Progress Notes (Signed)
Daily Progress Note   Patient Name: Ryan Day       Date: 08/07/2020 DOB: 06/22/1951  Age: 69 y.o. MRN#: [id number] Attending Physician: [name], MD Primary Care Physician: Valerie Roys, DO Admit Date: 07/25/2020  Reason for Consultation/Follow-up: Establishing goals of care  Subjective: Patient is resting in bed. Per staff, wife wants to speak with palliative medicine. Wife no longer at bedside. He states I can speak with his wife and to speak with her about Ryan Day.   Spoke with wife. She is tearful, and states she understands he could die at any time. She states she will support whatever he wants. She states Ryan Day wants to do what he can to live as long as he can as he has died twice, and God left him here for a reason.  She discusses God, and that God will take care of you. She discusses what support she has. She states she will continue conversations with Ryan Day to determine and honor his wishes.      Length of Stay: 13  Current Medications: Scheduled Meds:  . ascorbic acid  500 mg Per Tube BID  . baclofen  10 mg Per Tube TID  . bisacodyl  10 mg Rectal QHS  . chlorhexidine  15 mL Mouth Rinse BID  . Chlorhexidine Gluconate Cloth  6 each Topical Daily  . collagenase   Topical Daily  . enoxaparin (LOVENOX) injection  40 mg Subcutaneous Q24H  . famotidine  20 mg Per Tube Daily  . feeding supplement (OSMOLITE 1.5 CAL)  237 mL Per Tube Q3H  . feeding supplement (PROSource TF)  45 mL Per Tube BID  . ferrous sulfate  75 mg Per Tube Daily  . fludrocortisone  0.2 mg Per Tube Daily  . FLUoxetine  40 mg Per Tube Daily  . free water  300 mL Per Tube Q4H  . glycopyrrolate  1 mg Per Tube TID  . guaiFENesin  20 mL Per Tube Q6H  . lidocaine  1 patch Transdermal Q24H  . lidocaine  15 mL  Mouth/Throat Once  . mouth rinse  15 mL Mouth Rinse q12n4p  . methylPREDNISolone (SOLU-MEDROL) injection  20 mg Intravenous Q12H  . midodrine  10 mg Per Tube TID WC  . multivitamin  5 mL Per Tube Daily  . nutrition supplement (  JUVEN)  1 packet Per Tube BID BM  . polycarbophil  625 mg Per Tube Daily  . saccharomyces boulardii  250 mg Per Tube BID  . scopolamine  1 patch Transdermal Q72H  . Simethicone  80 mg Per Tube QID  . traZODone  50 mg Per Tube QHS    Continuous Infusions: . sodium chloride Stopped (07/25/20 2347)    PRN Meds: acetaminophen, bisacodyl, hydrocortisone cream  Physical Exam Pulmonary:     Comments: Tracheostomy.  Neurological:     Mental Status: He is alert.             Vital Signs: BP 122/67 (BP Location: Left Arm)   Pulse 77   Temp 98.6 F (37 C) (Oral)   Resp (!) 22   Ht 6\' 5"  (1.956 m)   Wt 110.7 kg   SpO2 97%   BMI 28.94 kg/m  SpO2: SpO2: 97 % O2 Device: O2 Device: Tracheostomy Collar O2 Flow Rate: O2 Flow Rate (L/min): 10 L/min  Intake/output summary:   Intake/Output Summary (Last 24 hours) at 08/07/2020 1301 Last data filed at 08/07/2020 1200 Gross per 24 hour  Intake 1674 ml  Output 4950 ml  Net -3276 ml   LBM: Last BM Date: 08/06/20 Baseline Weight: Weight: 108 kg Most recent weight: Weight: 110.7 kg       Palliative Assessment/Data:    Flowsheet Rows     Most Recent Value  Intake Tab  Referral Department Hospitalist  Unit at Time of Referral ICU  Date Notified 07/26/20  Palliative Care Type Return patient Palliative Care  Reason for referral Clarify Goals of Care  Date of Admission 07/25/20  Date first seen by Palliative Care 07/27/20  # of days Palliative referral response time 1 Day(s)  # of days IP prior to Palliative referral 1  Clinical Assessment  Psychosocial & Spiritual Assessment  Palliative Care Outcomes      Patient Active Problem List   Diagnosis Date Noted  . Goals of care, counseling/discussion    . Sacral decubitus ulcer, stage II ([place]) 07/25/2020  . Aspiration pneumonia ([place]) 07/25/2020  . Palliative care by specialist   . Salivary secretion   . Spasticity   . Status post tracheostomy ([place])   . Spinal cord injury, cervical region, sequela ([place]) 06/23/2020  . Pressure injury of skin 06/23/2020  . S/P percutaneous endoscopic gastrostomy (PEG) tube placement ([place])   . Tracheostomy in place [facility])   . Quadriplegia ([place])   . Neurogenic orthostatic hypotension ([place])   . Acute on chronic anemia   . Hypernatremia   . Neurogenic bladder   . Neurogenic bowel   . Acute on chronic respiratory failure with hypoxia ([place])   . Autonomic instability   . Cardiac arrest ([place])   . Cervical spinal cord injury, sequela ([place])   . Acute renal injury due to hypovolemia ([place])   . Family history of stomach cancer   . Exercise-induced leg cramps 05/13/2017  . Elevated alkaline phosphatase level 03/13/2015  . Hyperlipidemia   . Hypertension     Palliative Care Assessment & Plan    Recommendations/Plan: Palliative at D/C.     Code Status:    Code Status Orders  (From admission, onward)         Start     Ordered   07/25/20 2244  Full code  Continuous        07/25/20 2244        Code Status History    Date Active  Date Inactive Code Status Order ID Comments User Context   06/23/2020 1817 07/20/2020 1604 Full Code [id number]  Flora Lipps Inpatient   06/08/2020 2575 06/23/2020 1640 Full Code [id number]  Esmond Camper Inpatient   Advance Care Planning Activity      Prognosis:  Unable to determine   Thank you for allowing the Palliative Medicine Team to assist in the care of this patient.   Total Time 35 min Prolonged Time Billed  no      Greater than 50%  of this time was spent counseling and coordinating care related to the above assessment and plan.  Asencion Gowda, NP  Please contact Palliative Medicine Team phone at [phone] for questions and concerns.

## 2020-08-08 DIAGNOSIS — R1310 Dysphagia, unspecified: Secondary | ICD-10-CM

## 2020-08-08 LAB — CULTURE, BAL-QUANTITATIVE W GRAM STAIN
Culture: 60000 — AB
Gram Stain: NONE SEEN

## 2020-08-08 LAB — RENAL FUNCTION PANEL
Albumin: 2.1 g/dL — ABNORMAL LOW (ref 3.5–5.0)
Anion gap: 9 (ref 5–15)
BUN: 26 mg/dL — ABNORMAL HIGH (ref 8–23)
CO2: 29 mmol/L (ref 22–32)
Calcium: 8.9 mg/dL (ref 8.9–10.3)
Chloride: 98 mmol/L (ref 98–111)
Creatinine, Ser: 0.69 mg/dL (ref 0.61–1.24)
GFR, Estimated: >60 mL/min (ref >60)
Glucose, Bld: 117 mg/dL — ABNORMAL HIGH (ref 70–99)
Phosphorus: 2.9 mg/dL (ref 2.5–4.6)
Potassium: 4.2 mmol/L (ref 3.5–5.1)
Sodium: 136 mmol/L (ref 135–145)

## 2020-08-08 LAB — CBC
HCT: 26.5 % — ABNORMAL LOW (ref 39.0–52.0)
Hemoglobin: 8.5 g/dL — ABNORMAL LOW (ref 13.0–17.0)
MCH: 28.1 pg (ref 26.0–34.0)
MCHC: 32.1 g/dL (ref 30.0–36.0)
MCV: 87.7 fL (ref 80.0–100.0)
Platelets: 343 10*3/uL (ref 150–400)
RBC: 3.02 MIL/uL — ABNORMAL LOW (ref 4.22–5.81)
RDW: 17.9 % — ABNORMAL HIGH (ref 11.5–15.5)
WBC: 12.5 10*3/uL — ABNORMAL HIGH (ref 4.0–10.5)
nRBC: 0.2 % (ref 0.0–0.2)

## 2020-08-08 LAB — MAGNESIUM: Magnesium: 2.3 mg/dL (ref 1.7–2.4)

## 2020-08-08 MED ORDER — RIVAROXABAN 10 MG PO TABS
ORAL_TABLET | ORAL | 1 refills | Status: DC
Start: 2020-08-08 — End: 2021-02-12

## 2020-08-08 NOTE — TOC Transition Note (Signed)
Transition of Care [facility]) - CM/SW Discharge Note   Patient Details  Name: Ryan Day MRN: [id number] Date of Birth: Oct 02, 1950  [facility] CM/SW Contact:  Shelbie Hutching, RN Phone Number: 08/08/2020, 1:27 PM   Clinical Narrative:    Patient is medically cleared for discharge home.  Patient will transport via EMS.  TOC team has been unable to find home health company to accept for patient.  Patient's wife voices that she does have her daughters that help at home.   Patient's wife reports that the patient came with a cap on his trach and that it is now missing.  Wife would like the cap replaced before discharge.  Respiratory therapy reports that they do not have the proper size for the patient's trach.  [employer] will order a cap to be delivered in the mail.  RNCM contacted [facility] to see if they have a cap that will fit.  Anderson Malta with [facility] will let this RNCM know if they have one.    Final next level of care: Home/Self Care Barriers to Discharge: Barriers Resolved   Patient Goals and CMS Choice Patient states their goals for this hospitalization and ongoing recovery are:: Hone with outpatient palliative CMS Medicare.gov Compare Post Acute Care list provided to:: Patient Represenative (must comment) Choice offered to / list presented to : Spouse  Discharge Placement                       Discharge Plan and Services   Discharge Planning Services: CM Consult Post Acute Care Choice: Hospice          DME Arranged: N/A DME Agency: NA       HH Arranged: NA          Social Determinants of Health (SDOH) Interventions     Readmission Risk Interventions No flowsheet data found.

## 2020-08-08 NOTE — Progress Notes (Signed)
Pt switched over to low- air loss mattress.

## 2020-08-08 NOTE — Progress Notes (Signed)
[place] attempted visit but RN speaking w/wife; per 1C board, pt. being discharged today.  [place] encountered wife briefly in 1C hallway a short while later --> she shared pt. is indeed being discharged and expressed gratitude for chaplain support during this admission.

## 2020-08-08 NOTE — Discharge Summary (Signed)
Physician Discharge Summary  Ryan Day SWN:[id number] DOB: 10-22-1950 DOA: 07/25/2020  PCP: Valerie Roys, DO  Admit date: 07/25/2020 Discharge date: 08/08/2020  Admitted From: Home Disposition: Home with home hospice/palliative care  Recommendations for Outpatient Follow-up:  1. Follow ups as below. 2. Please obtain CBC/BMP/Mag at follow up 3. Please follow up on the following pending results: BAL culture  Home Health: Home hospice/palliative care Equipment/Devices: Patient has appropriate DME.  Oxygen ordered.  Discharge Condition: Stable CODE STATUS: Full code   Follow-up Information    Park Liter P, DO. Go on 08/16/2020.   Specialty: Family Medicine Why: @ 11:20am Contact information: [place] [address] [phone]              Hospital Course: 69 year old male with quadriplegia s/p fall in August 2021, trach and PEG dependence presenting with shortness of breath, cough with copious amount of sputum, and admitted for acute on chronic respiratory failure with hypoxia due to aspiration pneumonia and intermittent mucous plugging.   Patient continued to have intermittent tachypnea with right mucous plugging after a course of IV antibiotic for aspiration pneumonia.  He also spiked fever and restarted on Zosyn.  BAL culture NGTD.  On 11/11, CXR with RLL collapse.  Patient had repeat bronchoscopy on 11/12 had shown to confirm throughout from trachea down bilaterally, mucous plugging of superior segment of RLL and anterior basal segment of RLL aspirated and evacuated.  Patient remained stable after bronchoscopy.  Continues to require 8 liter via trach collar and discharged on this.  See individual problem list below for more on hospital course.  Discharge Diagnoses:  Acute on chronic hypoxemic respiratory failure due to recurrent aspiration pneumonia and recurrent mucous plugging and patient with tracheostomy and PEG tube after spinal cord  injury- -status post bronchoscopy on 11/8 and 11/12. -Unasyn 11/2-11/7.  Zosyn 11/9-11/12. Leukocytosis and procalcitonin improving.  Culture data as above.  -Continue bronchodilators, pulmonary hygiene and aspiration precautions. -Continue scopolamine much -Discharge home with home hospice/palliative on 8 L -Follow BAL culture -Cleared for discharge by intensivist.  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 -Started low-dose Xarelto for VTE prophylaxis  Dysphagia: has PEG tube.  Patient had normal PEG tube barium study by PCCM -Continue bolus tube feeds with free water as below -Aspiration precautions  Hypernatremia/hyponatremia: Resolved. -Continue Florinef.  Hypophosphatemia/hypokalemia: Resolved.  Hyperglycemia without diagnosis of diabetes: Likely due to dextrose.  Improved.  Bradycardia/hypotension: Seems to be within normal today. -Per PCCM, cardiology recommended considering anticoagulation mainly due to risk for DVT/PE. -Started low-dose Xarelto -Continue midodrine and Florinef  Iron deficiency anemia: Hgb 8.6. (stable). -Received Venofer infusion 11/6 and 11/8 -Recheck CBC in 1 to 2 weeks   Leukocytosis/bandemia: WBC 14>11> 15.6> 12.5.  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  Goal of care: Overall, grim prognosis.    Palliative consulted. -Palliative to follow patient outpatient  Nutrition: Currently n.p.o. due to aspiration risk -TF feed and free water Body mass index is 29.07 kg/m. Nutrition Problem: Inadequate oral intake Etiology: inability to eat Signs/Symptoms: NPO status (reliance on tube feeds and free water per G-tube to meet calorie/protein/hydration needs.) Interventions: Tube feeding, Prostat, MVI  Sacral  decubitus ulcer, stage II-POA.  Due to quadriplegia.   -Frequent repositioning -WOCN recommendation as below Pressure Injury 07/26/20 Sacrum  Unstageable - Full thickness tissue loss in which the base of the injury is covered by slough (yellow, tan, gray, green or brown) and/or eschar (tan, brown or black) in the wound bed. (Active)  07/26/20 0513  Location: Sacrum  Location Orientation:   Staging: Unstageable - Full thickness tissue loss in which the base of the injury is covered by slough (yellow, tan, gray, green or brown) and/or eschar (tan, brown or black) in the wound bed.  Wound Description (Comments):   Present on Admission: Yes    Discharge Exam: Vitals:   08/08/20 0826 08/08/20 1152  BP: (!) 97/55 119/63  Pulse: 68 64  Resp: 17 17  Temp: (!) 97.3 F (36.3 C) 97.6 F (36.4 C)  SpO2: 99% 100%    GENERAL: No apparent distress.  Nontoxic. HEENT: MMM.  Vision and hearing grossly intact.  NECK: Tracheostomy in place. RESP: On 8 L / 35% FiO2 via trach.  No IWOB.  Fair aeration bilaterally. CVS:  RRR. Heart sounds normal.  ABD/GI/GU: Bowel sounds present. Soft. Non tender.  PEG tube. MSK/EXT: Quadriplegia.  Some swelling in his arms or legs SKIN: Stage II sacral decubitus. NEURO: Awake and alert.  Appears to be fairly oriented.  Quadriplegia. PSYCH: Calm. Normal affect.  Discharge Instructions  Discharge Instructions    Call MD for:  difficulty breathing, headache or visual disturbances   Complete by: As directed    Call MD for:  severe uncontrolled pain   Complete by: As directed    Discharge instructions   Complete by: As directed    It has been a pleasure taking care of you!  You were hospitalized due to trouble breathing and low oxygen likely from aspiration pneumonia and mucous plugging for which were treated with bronchoscopy and antibiotics.   Take care,   Discharge wound care:   Complete by: As directed    Apply Santyl to sacrum wound Q day, then cover  with moist gauze and foam dressing.  (Change foam dressing Q 3 days or PRN soiling.)   Increase activity slowly   Complete by: As directed      Allergies as of 08/08/2020   No Known Allergies     Medication List    STOP taking these medications   enoxaparin 40 MG/0.4ML injection Commonly known as: LOVENOX     TAKE these medications   acetaminophen 160 MG/5ML solution Commonly known as: TYLENOL Place 10-20 mLs (320-640 mg total) into feeding tube every 4 (four) hours as needed for mild pain.   ascorbic acid 500 MG tablet Commonly known as: VITAMIN C Place 1 tablet (500 mg total) into feeding tube 2 (two) times daily.   Baclofen 5 MG Tabs Place 5 mg into feeding tube 3 (three) times daily.   bisacodyl 10 MG suppository Commonly known as: DULCOLAX Place 1 suppository (10 mg total) rectally at bedtime.   chlorhexidine 0.12 % solution Commonly known as: PERIDEX 15 mLs by Mouth Rinse route 2 (two) times daily.   collagenase ointment Commonly known as: SANTYL Apply topically daily.   famotidine 20 MG tablet Commonly known as: PEPCID Place 1 tablet (20 mg total) into feeding tube daily.   feeding supplement (OSMOLITE 1.5 CAL) Liqd Place 474 mLs into feeding tube 4 (four) times daily.   nutrition supplement (JUVEN) Pack Place 1 packet into feeding tube 2 (two) times daily between meals.   feeding supplement (PROSource TF) liquid Place 45 mLs into feeding tube 2 (two) times daily.   ferrous sulfate  300 (60 Fe) MG/5ML syrup Place 1.3 mLs (78 mg total) into feeding tube daily.   fludrocortisone 0.1 MG tablet Commonly known as: FLORINEF Place 2 tablets (0.2 mg total) into feeding tube daily.   FLUoxetine 20 MG/5ML solution Commonly known as: PROZAC Place 10 mLs (40 mg total) into feeding tube daily.   free water Soln Place 400 mLs into feeding tube every 4 (four) hours. What changed: how much to take   guaiFENesin 200 MG tablet Place 2 tablets (400 mg total)  into feeding tube every 6 (six) hours.   lidocaine 5 % Commonly known as: LIDODERM Place 1 patch onto the skin daily. Remove & Discard patch within 12 hours or as directed by MD   midodrine 10 MG tablet Commonly known as: PROAMATINE Place 1 tablet (10 mg total) into feeding tube 3 (three) times daily with meals.   mouth rinse Liqd solution 15 mLs by Mouth Rinse route 2 times daily at 12 noon and 4 pm.   multivitamin Liqd Place 5 mLs into feeding tube daily.   ondansetron 4 MG tablet Commonly known as: ZOFRAN Place 1 tablet (4 mg total) into feeding tube every 8 (eight) hours as needed for nausea, vomiting or refractory nausea / vomiting.   polycarbophil 625 MG tablet Commonly known as: FIBERCON Place 1 tablet (625 mg total) into feeding tube daily.   rivaroxaban 10 MG Tabs tablet Commonly known as: XARELTO Take one tablet per tube daily   saccharomyces boulardii 250 MG capsule Commonly known as: FLORASTOR Place 1 capsule (250 mg total) into feeding tube 2 (two) times daily.   scopolamine 1 MG/3DAYS Commonly known as: TRANSDERM-SCOP Place 1 patch (1.5 mg total) onto the skin every 3 (three) days.   sennosides 8.8 MG/5ML syrup Commonly known as: SENOKOT Place 5 mLs into feeding tube daily at 6 (six) AM.   simethicone 40 MG/0.6ML drops Commonly known as: MYLICON Place 1.2 mLs (80 mg total) into feeding tube 4 (four) times daily.   traZODone 50 MG tablet Commonly known as: DESYREL Place 1 tablet (50 mg total) into feeding tube at bedtime.   zinc sulfate 220 (50 Zn) MG capsule Place 1 capsule (220 mg total) into feeding tube daily.            Durable Medical Equipment  (From admission, onward)         Start     Ordered   08/08/20 0930  For home use only DME oxygen  Once       Comments: Trach collar  Question Answer Comment  Length of Need 6 Months   Mode or (Route) Mask   Liters per Minute 8   Frequency Continuous (stationary and portable oxygen unit  needed)   Oxygen delivery system Gas      08/08/20 0930           Discharge Care Instructions  (From admission, onward)         Start     Ordered   08/08/20 0000  Discharge wound care:       Comments: Apply Santyl to sacrum wound Q day, then cover with moist gauze and foam dressing.  (Change foam dressing Q 3 days or PRN soiling.)   08/08/20 3419          Consultations:  Intensivist  Palliative medicine  Cardiology  Procedures/Studies:  11/8-bronchoscopy  11/12-bronchoscopy   DG Chest 2 View  Result Date: 07/25/2020 CLINICAL DATA:  Shortness of breath. EXAM: CHEST - 2 VIEW  COMPARISON:  07/23/2020 FINDINGS: Cardiomediastinal silhouette is similar to prior. Bibasilar opacities, best appreciated on the lateral radiograph. No visible pleural effusions or pneumothorax. Tracheostomy tube is midline with the tip at the level of the inferior clavicular heads. IMPRESSION: Bibasilar airspace opacities, concerning for aspiration and/or pneumonia. Electronically Signed   By: Margaretha Sheffield MD   On: 07/25/2020 13:59   DG Chest 2 View  Result Date: 07/11/2020 CLINICAL DATA:  Congestion. EXAM: CHEST - 2 VIEW COMPARISON:  07/09/2020.  06/19/2020. FINDINGS: Tracheostomy tube in stable position. Mediastinum hilar structures stable. Stable cardiomegaly. No pulmonary venous congestion. Low lung volumes with mild bibasilar atelectasis/infiltrates again noted. Stable left base pleuroparenchymal thickening consistent with scarring. Degenerative change thoracic spine. IMPRESSION: 1. Tracheostomy tube in stable position. 2. Stable cardiomegaly. 3. Low lung volumes with mild bibasilar atelectasis/infiltrates again noted. Electronically Signed   By: Marcello Moores  Register   On: 07/11/2020 16:18   DG Chest Port 1 View  Result Date: 08/05/2020 CLINICAL DATA:  Cough and shortness of breath EXAM: PORTABLE CHEST 1 VIEW COMPARISON:  August 04, 2020 FINDINGS: Tracheostomy catheter tip is 6.0 cm above  the carina. No pneumothorax. Questionable pneumomediastinum slightly to the right of midline there is bibasilar atelectasis. There is persistent airspace consolidation in portions of the right mid and lower lung regions. Heart is mildly enlarged, stable, with pulmonary vascularity within normal limits. No adenopathy. No bone lesions. IMPRESSION: Tracheostomy as described without pneumothorax. Question mild pneumomediastinum, similar to 1 day prior. Persistent consolidation in portions of the right middle and lower lobes with bibasilar atelectasis. Stable cardiac prominence. Electronically Signed   By: Lowella Grip III M.D.   On: 08/05/2020 10:07   DG Chest Port 1 View  Result Date: 08/04/2020 CLINICAL DATA:  Shortness of breath EXAM: PORTABLE CHEST 1 VIEW COMPARISON:  August 03, 2020 FINDINGS: Tracheostomy catheter tip is 7.1 cm above the carina. No pneumothorax. Apparent pneumomediastinum immediately to the right of the trachea. There is persistent consolidation with volume loss and portions of the right middle and lower lobes. There is slight left base atelectasis. Heart is prominent with pulmonary vascularity normal. No adenopathy. No bone lesions. IMPRESSION: Tracheostomy as described without pneumothorax. Apparent pneumomediastinum immediately to the right of the trachea of uncertain etiology. Persistent significant volume loss right middle and lower lobe regions with consolidation in these areas. Mild left base atelectasis. No new opacity evident. Stable cardiac silhouette. These results will be called to the ordering clinician or representative by the Radiologist Assistant, and communication documented in the PACS or [employer]. Electronically Signed   By: Lowella Grip III M.D.   On: 08/04/2020 08:24   DG Chest Port 1 View  Result Date: 08/03/2020 CLINICAL DATA:  Shortness of breath EXAM: PORTABLE CHEST 1 VIEW COMPARISON:  Yesterday FINDINGS: Tracheostomy tube in place. Collapse of  the right lower lobe and possibly middle lobe with obscured diaphragm. Hazy density elsewhere is improved. No visible effusion or pneumothorax. High-density in the left upper quadrant which is enteric. IMPRESSION: Interval lobar collapse at the right base. Electronically Signed   By: Monte Fantasia M.D.   On: 08/03/2020 05:21   DG Chest Port 1 View  Result Date: 08/02/2020 CLINICAL DATA:  Aspiration pneumonia EXAM: PORTABLE CHEST 1 VIEW COMPARISON:  Yesterday FINDINGS: Improved inflation of the right lung. There is still diffuse hazy and interstitial opacity. Tracheostomy tube in place. Stable heart size and mediastinal contours. Bulky endplate spurring IMPRESSION: 1. Improved inflation of the right lung. 2. Diffuse interstitial  and airspace opacity, question superimposed edema. Electronically Signed   By: Monte Fantasia M.D.   On: 08/02/2020 05:00   DG Chest Port 1 View  Result Date: 08/01/2020 CLINICAL DATA:  Aspiration pneumonia.  Hypoxia and tachypnea. EXAM: PORTABLE CHEST 1 VIEW COMPARISON:  08/01/2020 FINDINGS: Progressive collapse on the right with increased midline shift to the right. Small right effusion. Left lung remains clear.  Tracheostomy remains in good position. IMPRESSION: Progressive airspace disease and volume loss on the right compatible with partial collapse of the right lung. Right bronchial cut off suggesting mucous plugging Electronically Signed   By: Franchot Gallo M.D.   On: 08/01/2020 08:34   DG Chest Port 1 View  Result Date: 08/01/2020 CLINICAL DATA:  Acute respiratory failure with hypoxia EXAM: PORTABLE CHEST 1 VIEW COMPARISON:  Yesterday FINDINGS: Streaky and hazy opacity on the right with volume loss. There was a chest CT at outside hospital 05/09/2020 with no mention of hilar or pulmonary mass. Relatively clear left lung. Cardiac enlargement. No visible effusion or pneumothorax. Tracheostomy tube in place. IMPRESSION: Unchanged right pulmonary opacification with volume  loss. Electronically Signed   By: Monte Fantasia M.D.   On: 08/01/2020 07:55   DG Chest Port 1 View  Result Date: 07/31/2020 CLINICAL DATA:  Respiratory failure. EXAM: PORTABLE CHEST 1 VIEW COMPARISON:  07/31/2020 FINDINGS: At 1949 hours. The cardio pericardial silhouette is enlarged. Low lung volumes. Interval decrease in right lung collapse/consolidative opacity. Tracheostomy tube again noted. Telemetry leads overlie the chest. IMPRESSION: 1. Interval decrease in right lung collapse/consolidative opacity. 2. Low lung volumes. Electronically Signed   By: Misty Stanley M.D.   On: 07/31/2020 20:13   DG Chest Port 1 View  Result Date: 07/31/2020 CLINICAL DATA:  Worsening shortness of breath today. History of hypertension. EXAM: PORTABLE CHEST 1 VIEW COMPARISON:  07/25/2020 FINDINGS: Tracheostomy in place. Left lung remains largely clear. New development of infiltrate, volume loss and consolidation in the mid and lower lung on the right. IMPRESSION: New infiltrate, volume loss and consolidation in the mid and lower lung on the right. Electronically Signed   By: Nelson Chimes M.D.   On: 07/31/2020 17:14   DG Chest Port 1 View  Result Date: 07/23/2020 CLINICAL DATA:  Chest pain EXAM: PORTABLE CHEST 1 VIEW COMPARISON:  07/11/2020, 07/09/2020 FINDINGS: Tracheostomy unchanged in expected position. Right basilar atelectasis or infiltrate is unchanged from multiple prior examinations. The lungs are otherwise clear. No pneumothorax or pleural effusion. Cardiac size within normal limits. Pulmonary vascularity is normal. No acute bone abnormality. IMPRESSION: Stable right basilar atelectasis or infiltrate. Electronically Signed   By: Fidela Salisbury MD   On: 07/23/2020 03:21   DG Abd Portable 1V  Result Date: 07/28/2020 CLINICAL DATA:  Abdominal distension. EXAM: PORTABLE ABDOMEN - 1 VIEW COMPARISON:  06/28/2020 FINDINGS: Left upper quadrant gastrostomy tube is identified. There is mild gaseous distension of the  transverse colon and descending colon. No dilated loops of small bowel. Desiccated stool noted within the rectum. No radio-opaque calculi or other significant radiographic abnormality are seen. IMPRESSION: 1. Nonobstructive bowel gas pattern. 2. Desiccated stool within the rectum. Correlate for any clinical signs or symptoms of fecal impaction. Electronically Signed   By: Kerby Moors M.D.   On: 07/28/2020 10:40   DG UGI W SINGLE CM (SOL OR THIN BA)  Result Date: 08/02/2020 CLINICAL DATA:  Evaluate for gastroesophageal reflux. Aspiration pneumonia. EXAM: UPPER GI SERIES WITHOUT KUB TECHNIQUE: Routine upper GI series was performed with thin  and thick barium. FLUOROSCOPY TIME:  Fluoroscopy Time:  0.8 minute Radiation Exposure Index (if provided by the fluoroscopic device): 8.9 mGy Number of Acquired Spot Images: 0 COMPARISON:  None. FINDINGS: Peg tube is present projecting over the stomach. 100 mL of thin barium was hand injected through the PEG tube into the stomach. The contrast opacifies the stomach. There is emptying of contrast into the proximal small bowel. During the period of observation, there was no gastroesophageal reflux. IMPRESSION: No evidence of gastroesophageal reflux. Electronically Signed   By: Kathreen Devoid   On: 08/02/2020 13:43   ECHOCARDIOGRAM COMPLETE  Result Date: 07/26/2020    ECHOCARDIOGRAM REPORT   Patient Name:   Ryan Day Date of Exam: 07/26/2020 Medical Rec #:  [id number]   Height:       77.0 in Accession #:    [id number]  Weight:       238.1 lb Date of Birth:  11/19/1950    BSA:          2.409 m Patient Age:    66 years    BP:           105/67 mmHg Patient Gender: M           HR:           72 bpm. Exam Location:  [facility] Procedure: 2D Echo, Cardiac Doppler and Color Doppler Indications:     Hypotension  History:         Patient has no prior history of Echocardiogram examinations.                  Risk Factors:Hypertension and Dyslipidemia.  Sonographer:     Sherrie Sport RDCS (AE)  Referring Phys:  [id number] Para Skeans Diagnosing Phys: Kate Sable MD  Sonographer Comments: Suboptimal apical window and no subcostal window. IMPRESSIONS  1. Left ventricular ejection fraction, by estimation, is 55 to 60%. The left ventricle has normal function. The left ventricle has no regional wall motion abnormalities. Left ventricular diastolic parameters are consistent with Grade I diastolic dysfunction (impaired relaxation).  2. Right ventricular systolic function is normal. The right ventricular size is normal.  3. The mitral valve is normal in structure. No evidence of mitral valve regurgitation. No evidence of mitral stenosis.  4. The aortic valve is normal in structure. Aortic valve regurgitation is not visualized. No aortic stenosis is present. FINDINGS  Left Ventricle: Left ventricular ejection fraction, by estimation, is 55 to 60%. The left ventricle has normal function. The left ventricle has no regional wall motion abnormalities. The left ventricular internal cavity size was normal in size. There is  no left ventricular hypertrophy. Left ventricular diastolic parameters are consistent with Grade I diastolic dysfunction (impaired relaxation). Right Ventricle: The right ventricular size is normal. No increase in right ventricular wall thickness. Right ventricular systolic function is normal. Left Atrium: Left atrial size was normal in size. Right Atrium: Right atrial size was normal in size. Pericardium: There is no evidence of pericardial effusion. Mitral Valve: The mitral valve is normal in structure. No evidence of mitral valve regurgitation. No evidence of mitral valve stenosis. Tricuspid Valve: The tricuspid valve is normal in structure. Tricuspid valve regurgitation is not demonstrated. No evidence of tricuspid stenosis. Aortic Valve: The aortic valve is normal in structure. Aortic valve regurgitation is not visualized. No aortic stenosis is present. Aortic valve mean gradient measures 5.0  mmHg. Aortic valve peak gradient measures 9.0 mmHg. Aortic valve area, by VTI measures 2.27 cm.  Pulmonic Valve: The pulmonic valve was normal in structure. Pulmonic valve regurgitation is not visualized. No evidence of pulmonic stenosis. Aorta: The aortic root is normal in size and structure. Venous: The inferior vena cava was not well visualized. IAS/Shunts: No atrial level shunt detected by color flow Doppler.  LEFT VENTRICLE PLAX 2D LVIDd:         4.92 cm  Diastology LVIDs:         3.15 cm  LV e' medial:    5.66 cm/s LV PW:         1.35 cm  LV E/e' medial:  13.6 LV IVS:        1.26 cm  LV e' lateral:   13.80 cm/s LVOT diam:     2.00 cm  LV E/e' lateral: 5.6 LV SV:         63 LV SV Index:   26 LVOT Area:     3.14 cm  RIGHT VENTRICLE RV Basal diam:  3.38 cm RV S prime:     15.40 cm/s TAPSE (M-mode): 3.6 cm LEFT ATRIUM           Index       RIGHT ATRIUM           Index LA diam:      2.70 cm 1.12 cm/m  RA Area:     17.80 cm LA Vol (A4C): 48.0 ml 19.93 ml/m RA Volume:   44.20 ml  18.35 ml/m  AORTIC VALVE                   PULMONIC VALVE AV Area (Vmax):    1.87 cm    PV Vmax:        1.11 m/s AV Area (Vmean):   1.90 cm    PV Peak grad:   4.9 mmHg AV Area (VTI):     2.27 cm    RVOT Peak grad: 5 mmHg AV Vmax:           150.00 cm/s AV Vmean:          97.750 cm/s AV VTI:            0.278 m AV Peak Grad:      9.0 mmHg AV Mean Grad:      5.0 mmHg LVOT Vmax:         89.40 cm/s LVOT Vmean:        59.000 cm/s LVOT VTI:          0.201 m LVOT/AV VTI ratio: 0.72  AORTA Ao Root diam: 3.10 cm MITRAL VALVE                TRICUSPID VALVE MV Area (PHT): 2.29 cm     TR Peak grad:   40.7 mmHg MV Decel Time: 332 msec     TR Vmax:        319.00 cm/s MV E velocity: 77.10 cm/s MV A velocity: 107.00 cm/s  SHUNTS MV E/A ratio:  0.72         Systemic VTI:  0.20 m                             Systemic Diam: 2.00 cm Kate Sable MD Electronically signed by Kate Sable MD Signature Date/Time: 07/26/2020/12:52:51 PM    Final          The results of significant diagnostics from this hospitalization (including imaging, microbiology, ancillary and laboratory) are listed below  for reference.     Microbiology: Recent Results (from the past 240 hour(s))  Culture, bal-quantitative     Status: None   Collection Time: 07/31/20  7:40 PM   Specimen: Bronchoalveolar Lavage; Respiratory  Result Value Ref Range Status   Specimen Description   Final    BRONCHIAL ALVEOLAR LAVAGE Performed at [facility], [address] [place], [place] [address]    Special Requests NONE  Final   Gram Stain   Final    FEW WBC PRESENT, PREDOMINANTLY PMN NO ORGANISMS SEEN    Culture   Final    NO GROWTH 2 DAYS Performed at [facility], [place] [address] [place], [place] [address]    Report Status 08/03/2020 FINAL  Final  Culture, bal-quantitative     Status: Abnormal   Collection Time: 08/04/20  5:38 PM   Specimen: Bronchoalveolar Lavage; Respiratory  Result Value Ref Range Status   Specimen Description   Final    BRONCHIAL ALVEOLAR LAVAGE Performed at [facility], [address], [place], [place] [address]    Special Requests   Final    NONE Performed at [facility], [place], [place] [address]    Gram Stain NO WBC SEEN NO ORGANISMS SEEN   Final   Culture (A)  Final    60,000 COLONIES/mL NEISSERIA WEAVERI BETA LACTAMASE NEGATIVE Performed at [facility], [address], [place], [place] [address]    Report Status 08/08/2020 FINAL  Final     Labs: BNP (last 3 results) Recent Labs    07/23/20 0343 07/25/20 1308  BNP 123.4* 12.8   Basic Metabolic Panel: Recent Labs  Lab 08/03/20 0515 08/03/20 0515 08/04/20 0405 08/04/20 0405 08/04/20 2029 08/05/20 0739 08/06/20 0012 08/07/20 0545 08/08/20 0550  NA 142  --   --   --   --  135 131* 135 136  K 3.7   < > 3.6   < > 3.8 4.3 4.1 4.9 4.2  CL 106  --   --   --   --  100 98 100 98  CO2 28  --   --    --   --  _0 GLUCOSE 208*  --   --   --   --  228* 97 117* 117*  BUN 29*  --   --   --   --  29* 31* 29* 26*  CREATININE 0.72  --   --   --   --  0.59* 0.53* 0.66 0.69  CALCIUM 9.7  --   --   --   --  8.7* 8.6* 8.6* 8.9  MG 2.6*   < > 2.3  --   --  2.1 2.2 2.2 2.3  PHOS 2.4*   < > 1.3*   < > 1.6* 1.8* 2.1* 2.9 2.9   < > = values in this interval not displayed.   Liver Function Tests: Recent Labs  Lab 08/06/20 0012 08/07/20 0545 08/08/20 0550  ALBUMIN 2.1* 2.1* 2.1*   No results for input(s): LIPASE, AMYLASE in the last 168 hours. No results for input(s): AMMONIA in the last 168 hours. CBC: Recent Labs  Lab 08/02/20 0634 08/02/20 0634 08/03/20 0515 08/05/20 0739 08/06/20 0012 08/07/20 0545 08/08/20 0550  WBC 13.9*   < > 11.5* 13.6* 15.6* 15.1* 12.5*  NEUTROABS 12.5*  --  10.1* 11.1* 11.9* 12.1*  --   HGB 8.4*   < >  8.6* 8.1* 8.9* 8.7* 8.5*  HCT 27.7*   < > 28.1* 25.7* 27.1* 27.2* 26.5*  MCV 90.2   < > 89.2 87.7 85.8 86.6 87.7  PLT 256   < > 162 263 374 382 343   < > = values in this interval not displayed.   Cardiac Enzymes: No results for input(s): CKTOTAL, CKMB, CKMBINDEX, TROPONINI in the last 168 hours. BNP: Invalid input(s): POCBNP CBG: Recent Labs  Lab 08/02/20 0021 08/05/20 1644 08/05/20 2104  GLUCAP 129* 205* 134*   D-Dimer No results for input(s): DDIMER in the last 72 hours. Hgb A1c No results for input(s): HGBA1C in the last 72 hours. Lipid Profile No results for input(s): CHOL, HDL, LDLCALC, TRIG, CHOLHDL, LDLDIRECT in the last 72 hours. Thyroid function studies No results for input(s): TSH, T4TOTAL, T3FREE, THYROIDAB in the last 72 hours.  Invalid input(s): FREET3 Anemia work up No results for input(s): VITAMINB12, FOLATE, FERRITIN, TIBC, IRON, RETICCTPCT in the last 72 hours. Urinalysis    Component Value Date/Time   COLORURINE YELLOW 07/19/2020 1935   APPEARANCEUR CLOUDY (A) 07/19/2020 1935   APPEARANCEUR Clear 02/01/2020 0920    LABSPEC 1.012 07/19/2020 1935   PHURINE 5.0 07/19/2020 1935   GLUCOSEU NEGATIVE 07/19/2020 1935   HGBUR NEGATIVE 07/19/2020 1935   BILIRUBINUR NEGATIVE 07/19/2020 1935   BILIRUBINUR Negative 02/01/2020 0920   KETONESUR NEGATIVE 07/19/2020 1935   PROTEINUR NEGATIVE 07/19/2020 1935   NITRITE NEGATIVE 07/19/2020 1935   LEUKOCYTESUR LARGE (A) 07/19/2020 1935   Sepsis Labs Invalid input(s): PROCALCITONIN,  WBC,  LACTICIDVEN   Time coordinating discharge: 40 minutes  SIGNED:  [name], MD  [facility] 08/08/2020, 12:22 PM  If 7PM-7AM, please contact night-coverage [email or web address]

## 2020-08-08 NOTE — TOC Transition Note (Signed)
Transition of Care [facility]) - CM/SW Discharge Note   Patient Details  Name: Ryan Day MRN: [id number] Date of Birth: 08-22-1951  Transition [facility] CM/SW Contact:  Shelbie Hutching, RN Phone Number: 08/08/2020, 3:31 PM   Clinical Narrative:     [facility] transport arranged- informed that patient should be ready to go by 4pm this afternoon.  Representative from [facility] was able to bring a red cap over for the patient to go home with for his trach.  Respiratory therapy is going to see patient before he discharges.  Flo Shanks with [facility] aware of discharge, patient will be followed by Outpatient palliative at home.   Final next level of care: Home/Self Care Barriers to Discharge: Barriers Resolved   Patient Goals and CMS Choice Patient states their goals for this hospitalization and ongoing recovery are:: Hone with outpatient palliative CMS Medicare.gov Compare Post Acute Care list provided to:: Patient Represenative (must comment) Choice offered to / list presented to : Spouse  Discharge Placement                       Discharge Plan and Services   Discharge Planning Services: CM Consult Post Acute Care Choice: Hospice          DME Arranged: N/A DME Agency: NA       HH Arranged: NA          Social Determinants of Health (SDOH) Interventions     Readmission Risk Interventions No flowsheet data found.

## 2020-08-09 LAB — ASPERGILLUS ANTIGEN, BAL/SERUM: Aspergillus Ag, BAL/Serum: 0.73 Index — ABNORMAL HIGH (ref 0.00–0.49)

## 2020-08-10 ENCOUNTER — Telehealth: Payer: Self-pay | Admitting: Nurse Practitioner

## 2020-08-10 NOTE — Telephone Encounter (Signed)
Spoke with wife regarding the Palliative referral/services and she was in agreement with scheduling visit.  I have scheduled an In-person Consult for 08/11/20 @ 10:30 AM.

## 2020-08-11 ENCOUNTER — Other Ambulatory Visit: Payer: BC Managed Care – PPO | Admitting: Nurse Practitioner

## 2020-08-11 ENCOUNTER — Other Ambulatory Visit: Payer: Self-pay

## 2020-08-14 ENCOUNTER — Inpatient Hospital Stay
Admission: EM | Admit: 2020-08-14 | Discharge: 2020-08-25 | DRG: 853 | Disposition: A | Discharge status: Home Health | Payer: BC Managed Care – PPO | Attending: Internal Medicine | Admitting: Internal Medicine

## 2020-08-14 ENCOUNTER — Encounter: Payer: Self-pay | Admitting: Nurse Practitioner

## 2020-08-14 ENCOUNTER — Other Ambulatory Visit: Payer: BC Managed Care – PPO | Admitting: Nurse Practitioner

## 2020-08-14 ENCOUNTER — Encounter: Payer: Self-pay | Admitting: Internal Medicine

## 2020-08-14 ENCOUNTER — Emergency Department: Payer: BC Managed Care – PPO

## 2020-08-14 ENCOUNTER — Other Ambulatory Visit: Payer: Self-pay

## 2020-08-14 DIAGNOSIS — Z515 Encounter for palliative care: Secondary | ICD-10-CM

## 2020-08-14 DIAGNOSIS — R748 Abnormal levels of other serum enzymes: Secondary | ICD-10-CM | POA: Diagnosis present

## 2020-08-14 DIAGNOSIS — J189 Pneumonia, unspecified organism: Secondary | ICD-10-CM | POA: Diagnosis present

## 2020-08-14 DIAGNOSIS — M4628 Osteomyelitis of vertebra, sacral and sacrococcygeal region: Secondary | ICD-10-CM | POA: Diagnosis not present

## 2020-08-14 DIAGNOSIS — E785 Hyperlipidemia, unspecified: Secondary | ICD-10-CM | POA: Diagnosis present

## 2020-08-14 DIAGNOSIS — D509 Iron deficiency anemia, unspecified: Secondary | ICD-10-CM | POA: Diagnosis present

## 2020-08-14 DIAGNOSIS — F32A Depression, unspecified: Secondary | ICD-10-CM | POA: Diagnosis present

## 2020-08-14 DIAGNOSIS — K219 Gastro-esophageal reflux disease without esophagitis: Secondary | ICD-10-CM | POA: Diagnosis present

## 2020-08-14 DIAGNOSIS — D72829 Elevated white blood cell count, unspecified: Secondary | ICD-10-CM | POA: Diagnosis not present

## 2020-08-14 DIAGNOSIS — Z931 Gastrostomy status: Secondary | ICD-10-CM

## 2020-08-14 DIAGNOSIS — L89154 Pressure ulcer of sacral region, stage 4: Secondary | ICD-10-CM | POA: Diagnosis present

## 2020-08-14 DIAGNOSIS — J9611 Chronic respiratory failure with hypoxia: Secondary | ICD-10-CM | POA: Diagnosis not present

## 2020-08-14 DIAGNOSIS — Z8249 Family history of ischemic heart disease and other diseases of the circulatory system: Secondary | ICD-10-CM

## 2020-08-14 DIAGNOSIS — A419 Sepsis, unspecified organism: Principal | ICD-10-CM | POA: Diagnosis present

## 2020-08-14 DIAGNOSIS — L89153 Pressure ulcer of sacral region, stage 3: Secondary | ICD-10-CM | POA: Diagnosis present

## 2020-08-14 DIAGNOSIS — I10 Essential (primary) hypertension: Secondary | ICD-10-CM | POA: Diagnosis present

## 2020-08-14 DIAGNOSIS — J690 Pneumonitis due to inhalation of food and vomit: Secondary | ICD-10-CM | POA: Diagnosis present

## 2020-08-14 DIAGNOSIS — Y95 Nosocomial condition: Secondary | ICD-10-CM | POA: Diagnosis present

## 2020-08-14 DIAGNOSIS — R0989 Other specified symptoms and signs involving the circulatory and respiratory systems: Secondary | ICD-10-CM

## 2020-08-14 DIAGNOSIS — Z8674 Personal history of sudden cardiac arrest: Secondary | ICD-10-CM | POA: Diagnosis not present

## 2020-08-14 DIAGNOSIS — Z7901 Long term (current) use of anticoagulants: Secondary | ICD-10-CM

## 2020-08-14 DIAGNOSIS — G8250 Quadriplegia, unspecified: Secondary | ICD-10-CM | POA: Diagnosis present

## 2020-08-14 DIAGNOSIS — Z79899 Other long term (current) drug therapy: Secondary | ICD-10-CM | POA: Diagnosis not present

## 2020-08-14 DIAGNOSIS — E876 Hypokalemia: Secondary | ICD-10-CM | POA: Diagnosis not present

## 2020-08-14 DIAGNOSIS — S14109S Unspecified injury at unspecified level of cervical spinal cord, sequela: Secondary | ICD-10-CM | POA: Diagnosis not present

## 2020-08-14 DIAGNOSIS — Z7952 Long term (current) use of systemic steroids: Secondary | ICD-10-CM | POA: Diagnosis not present

## 2020-08-14 DIAGNOSIS — E870 Hyperosmolality and hypernatremia: Secondary | ICD-10-CM | POA: Diagnosis present

## 2020-08-14 DIAGNOSIS — I96 Gangrene, not elsewhere classified: Secondary | ICD-10-CM | POA: Diagnosis present

## 2020-08-14 DIAGNOSIS — L89152 Pressure ulcer of sacral region, stage 2: Secondary | ICD-10-CM | POA: Diagnosis not present

## 2020-08-14 DIAGNOSIS — Z930 Tracheostomy status: Secondary | ICD-10-CM | POA: Diagnosis not present

## 2020-08-14 DIAGNOSIS — E8809 Other disorders of plasma-protein metabolism, not elsewhere classified: Secondary | ICD-10-CM | POA: Diagnosis not present

## 2020-08-14 DIAGNOSIS — E782 Mixed hyperlipidemia: Secondary | ICD-10-CM

## 2020-08-14 DIAGNOSIS — Z20822 Contact with and (suspected) exposure to covid-19: Secondary | ICD-10-CM | POA: Diagnosis present

## 2020-08-14 DIAGNOSIS — D649 Anemia, unspecified: Secondary | ICD-10-CM | POA: Diagnosis not present

## 2020-08-14 DIAGNOSIS — Z9181 History of falling: Secondary | ICD-10-CM

## 2020-08-14 DIAGNOSIS — L89159 Pressure ulcer of sacral region, unspecified stage: Secondary | ICD-10-CM

## 2020-08-14 DIAGNOSIS — G903 Multi-system degeneration of the autonomic nervous system: Secondary | ICD-10-CM | POA: Diagnosis present

## 2020-08-14 DIAGNOSIS — R0602 Shortness of breath: Secondary | ICD-10-CM | POA: Diagnosis present

## 2020-08-14 LAB — CBC WITH DIFFERENTIAL/PLATELET
Abs Immature Granulocytes: 0.09 10*3/uL — ABNORMAL HIGH (ref 0.00–0.07)
Basophils Absolute: 0 10*3/uL (ref 0.0–0.1)
Basophils Relative: 0 %
Eosinophils Absolute: 0.1 10*3/uL (ref 0.0–0.5)
Eosinophils Relative: 1 %
HCT: 27.1 % — ABNORMAL LOW (ref 39.0–52.0)
Hemoglobin: 8.7 g/dL — ABNORMAL LOW (ref 13.0–17.0)
Immature Granulocytes: 1 %
Lymphocytes Relative: 8 %
Lymphs Abs: 0.9 10*3/uL (ref 0.7–4.0)
MCH: 28.5 pg (ref 26.0–34.0)
MCHC: 32.1 g/dL (ref 30.0–36.0)
MCV: 88.9 fL (ref 80.0–100.0)
Monocytes Absolute: 1.8 10*3/uL — ABNORMAL HIGH (ref 0.1–1.0)
Monocytes Relative: 17 %
Neutro Abs: 8.1 10*3/uL — ABNORMAL HIGH (ref 1.7–7.7)
Neutrophils Relative %: 73 %
Platelets: 318 10*3/uL (ref 150–400)
RBC: 3.05 MIL/uL — ABNORMAL LOW (ref 4.22–5.81)
RDW: 19.1 % — ABNORMAL HIGH (ref 11.5–15.5)
WBC: 11 10*3/uL — ABNORMAL HIGH (ref 4.0–10.5)
nRBC: 0 % (ref 0.0–0.2)

## 2020-08-14 LAB — RESP PANEL BY RT-PCR (FLU A&B, COVID) ARPGX2
Influenza A by PCR: NEGATIVE
Influenza B by PCR: NEGATIVE
SARS Coronavirus 2 by RT PCR: NEGATIVE

## 2020-08-14 LAB — COMPREHENSIVE METABOLIC PANEL
ALT: 31 U/L (ref 0–44)
AST: 21 U/L (ref 15–41)
Albumin: 2.6 g/dL — ABNORMAL LOW (ref 3.5–5.0)
Alkaline Phosphatase: 130 U/L — ABNORMAL HIGH (ref 38–126)
Anion gap: 5 (ref 5–15)
BUN: 23 mg/dL (ref 8–23)
CO2: 28 mmol/L (ref 22–32)
Calcium: 8.6 mg/dL — ABNORMAL LOW (ref 8.9–10.3)
Chloride: 102 mmol/L (ref 98–111)
Creatinine, Ser: 0.53 mg/dL — ABNORMAL LOW (ref 0.61–1.24)
GFR, Estimated: >60 mL/min (ref >60)
Glucose, Bld: 97 mg/dL (ref 70–99)
Potassium: 3.4 mmol/L — ABNORMAL LOW (ref 3.5–5.1)
Sodium: 135 mmol/L (ref 135–145)
Total Bilirubin: 0.5 mg/dL (ref 0.3–1.2)
Total Protein: 7.4 g/dL (ref 6.5–8.1)

## 2020-08-14 LAB — BRAIN NATRIURETIC PEPTIDE: B Natriuretic Peptide: 59.5 pg/mL (ref 0.0–100.0)

## 2020-08-14 LAB — URINALYSIS, COMPLETE (UACMP) WITH MICROSCOPIC
Bilirubin Urine: NEGATIVE
Glucose, UA: NEGATIVE mg/dL
Ketones, ur: NEGATIVE mg/dL
Nitrite: NEGATIVE
Protein, ur: NEGATIVE mg/dL
Specific Gravity, Urine: 1.011 (ref 1.005–1.030)
WBC, UA: >50 WBC/hpf — ABNORMAL HIGH (ref 0–5)
pH: 8 (ref 5.0–8.0)

## 2020-08-14 LAB — LACTIC ACID, PLASMA
Lactic Acid, Venous: 1.2 mmol/L (ref 0.5–1.9)
Lactic Acid, Venous: 1.5 mmol/L (ref 0.5–1.9)

## 2020-08-14 LAB — TROPONIN I (HIGH SENSITIVITY)
Troponin I (High Sensitivity): 9 ng/L (ref <18)
Troponin I (High Sensitivity): 9 ng/L (ref <18)

## 2020-08-14 IMAGING — DX DG CHEST 1V PORT
1 series · 1 of 1 positions shown · non-contrast
Comparison: [DATE], [DATE] [DATE], [DATE]

CLINICAL DATA: Worsening short of breath

EXAM:
PORTABLE CHEST 1 VIEW

[chest ap]
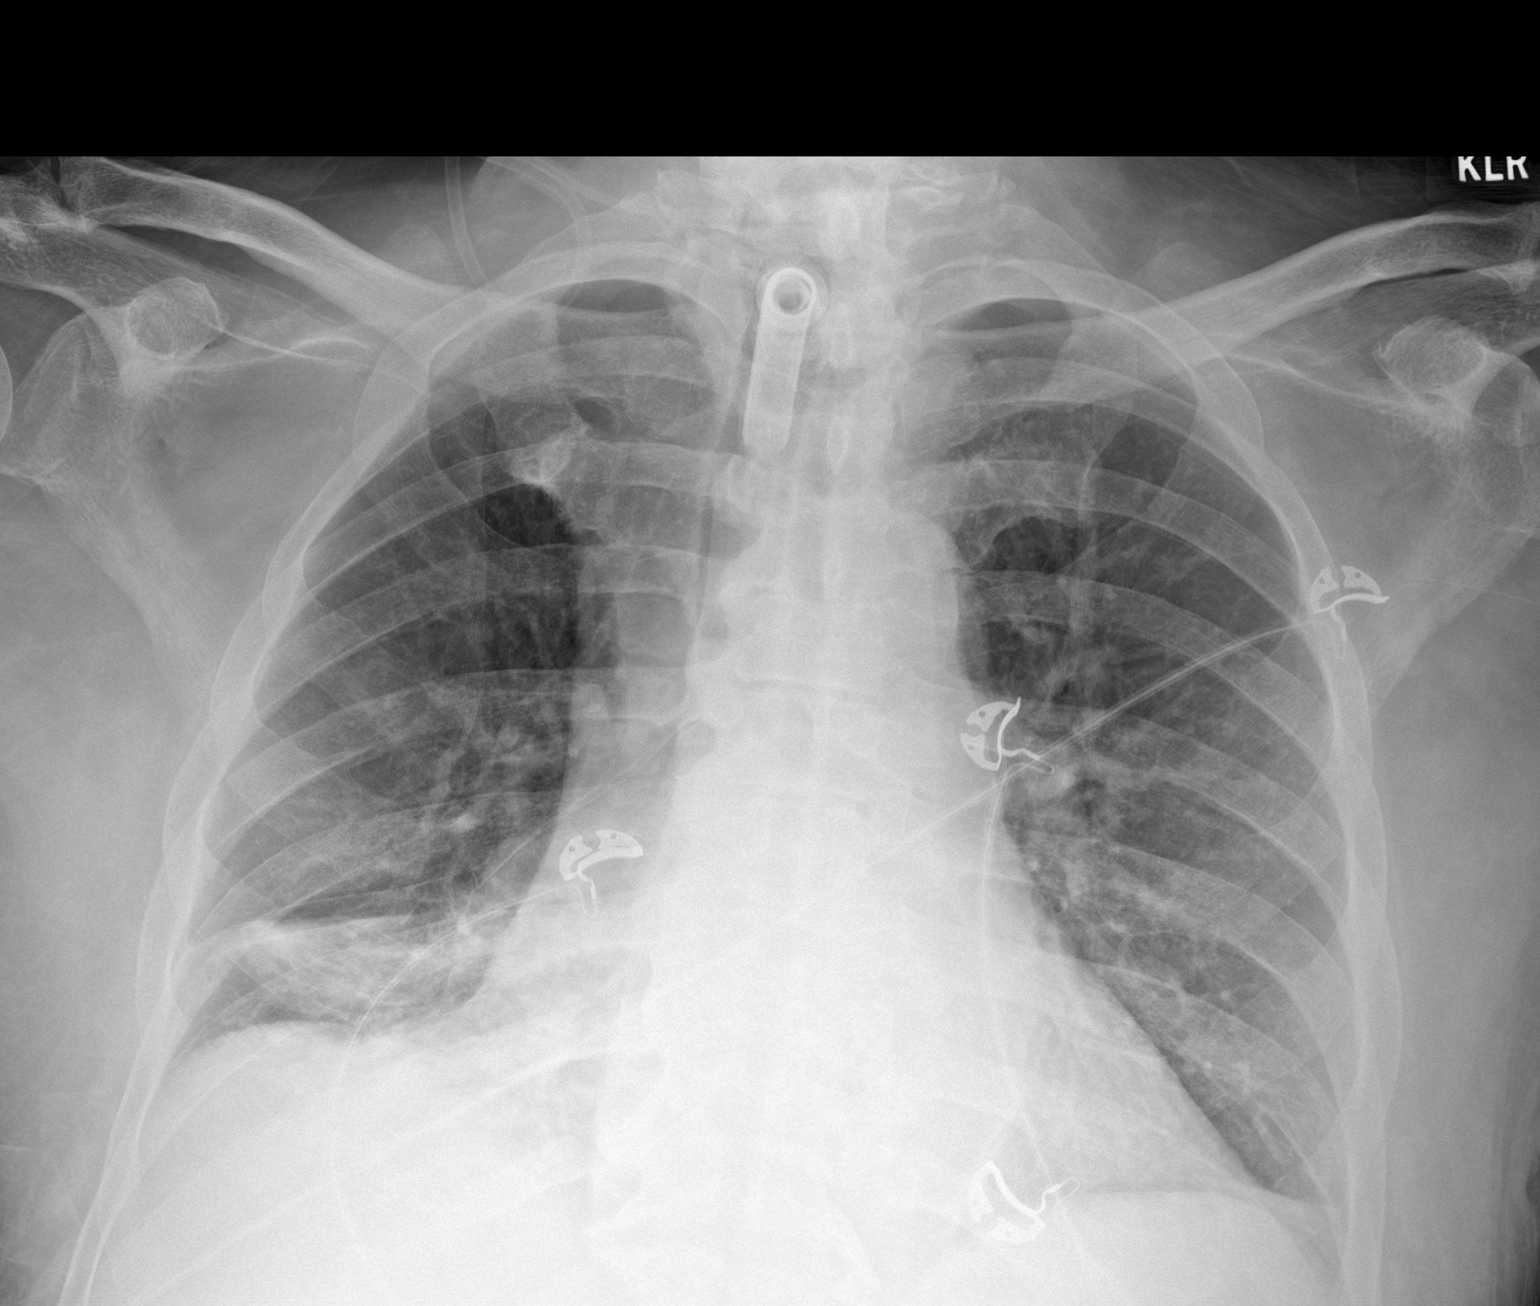

[1 of 1 positions shown; findings below may reference images not displayed]

FINDINGS: Tracheostomy tube remains in place. Surgical hardware in the
cervical spine. Partial consolidation in the right base slightly
increased compared with [DATE]. Stable cardiomediastinal
silhouette. No pneumothorax.
IMPRESSION: Partial consolidation at the right base, slightly increased compared
to [DATE], possible pneumonia.

## 2020-08-14 MED ORDER — MIDODRINE HCL 5 MG PO TABS
10.0000 mg | ORAL_TABLET | Freq: Three times a day (TID) | ORAL | Status: DC
  Administered 2020-08-15 – 2020-08-25 (×29): 10 mg
  Filled 2020-08-14 (×29): qty 2

## 2020-08-14 MED ORDER — FERROUS SULFATE 300 (60 FE) MG/5ML PO SYRP
75.0000 mg | ORAL_SOLUTION | Freq: Every day | ORAL | Status: DC
  Filled 2020-08-14: qty 5

## 2020-08-14 MED ORDER — SODIUM CHLORIDE 0.9 % IV SOLN
2.0000 g | Freq: Once | INTRAVENOUS | Status: AC
  Administered 2020-08-14: 2 g via INTRAVENOUS
  Filled 2020-08-14: qty 2

## 2020-08-14 MED ORDER — OSMOLITE 1.5 CAL PO LIQD: 474.0000 mL | Freq: Four times a day (QID) | ORAL | Status: DC

## 2020-08-14 MED ORDER — ONDANSETRON HCL 4 MG PO TABS: 4.0000 mg | ORAL_TABLET | Freq: Three times a day (TID) | ORAL | Status: DC | PRN

## 2020-08-14 MED ORDER — BISACODYL 10 MG RE SUPP
10.0000 mg | Freq: Every day | RECTAL | Status: DC
  Administered 2020-08-15 – 2020-08-16 (×2): 10 mg via RECTAL
  Filled 2020-08-14 (×6): qty 1

## 2020-08-14 MED ORDER — FREE WATER
300.0000 mL | Status: DC
  Administered 2020-08-14 – 2020-08-25 (×56): 300 mL

## 2020-08-14 MED ORDER — FLUOXETINE HCL 20 MG/5ML PO SOLN
40.0000 mg | Freq: Every day | ORAL | Status: DC
  Administered 2020-08-15 – 2020-08-25 (×10): 40 mg
  Filled 2020-08-14 (×11): qty 10

## 2020-08-14 MED ORDER — FLUDROCORTISONE ACETATE 0.1 MG PO TABS
0.2000 mg | ORAL_TABLET | Freq: Every day | ORAL | Status: DC
  Administered 2020-08-15 – 2020-08-25 (×10): 0.2 mg
  Filled 2020-08-14 (×12): qty 2

## 2020-08-14 MED ORDER — SODIUM CHLORIDE 0.9 % IV BOLUS
500.0000 mL | Freq: Once | INTRAVENOUS | Status: AC
  Administered 2020-08-14: 500 mL via INTRAVENOUS

## 2020-08-14 MED ORDER — FAMOTIDINE 20 MG PO TABS
20.0000 mg | ORAL_TABLET | Freq: Every day | ORAL | Status: DC
  Administered 2020-08-15 – 2020-08-25 (×10): 20 mg
  Filled 2020-08-14 (×10): qty 1

## 2020-08-14 MED ORDER — RIVAROXABAN 10 MG PO TABS
10.0000 mg | ORAL_TABLET | Freq: Every day | ORAL | Status: DC
  Administered 2020-08-15 – 2020-08-25 (×10): 10 mg
  Filled 2020-08-14 (×11): qty 1

## 2020-08-14 MED ORDER — OSMOLITE 1.5 CAL PO LIQD
237.0000 mL | Freq: Four times a day (QID) | ORAL | Status: DC
  Administered 2020-08-14 – 2020-08-15 (×3): 237 mL

## 2020-08-14 MED ORDER — INFLUENZA VAC A&B SA ADJ QUAD 0.5 ML IM PRSY
0.5000 mL | PREFILLED_SYRINGE | INTRAMUSCULAR | Status: DC
  Filled 2020-08-14: qty 0.5

## 2020-08-14 MED ORDER — PROSOURCE TF PO LIQD
45.0000 mL | Freq: Two times a day (BID) | ORAL | Status: DC
  Administered 2020-08-15 – 2020-08-25 (×15): 45 mL
  Filled 2020-08-14 (×20): qty 45

## 2020-08-14 MED ORDER — ENOXAPARIN SODIUM 40 MG/0.4ML ~~LOC~~ SOLN: 40.0000 mg | SUBCUTANEOUS | Status: DC

## 2020-08-14 MED ORDER — VANCOMYCIN HCL IN DEXTROSE 1-5 GM/200ML-% IV SOLN
1000.0000 mg | Freq: Once | INTRAVENOUS | Status: AC
  Administered 2020-08-14: 1000 mg via INTRAVENOUS
  Filled 2020-08-14: qty 200

## 2020-08-14 NOTE — Consult Note (Signed)
PHARMACY -  BRIEF ANTIBIOTIC NOTE   Pharmacy has received consult(s) for vancomycin + cefepime from an ED provider.  The patient's profile has been reviewed for ht/wt/allergies/indication/available labs.    One time order(s) placed for --Vancomycin 1 g (already ordered) --Cefepime 2 g (already ordered)  Further antibiotics/pharmacy consults should be ordered by admitting physician if indicated.                       Thank you, Benita Gutter 08/14/2020  6:48 PM

## 2020-08-14 NOTE — ED Notes (Signed)
Report called to mya rn all questions answered

## 2020-08-14 NOTE — Progress Notes (Signed)
[facility] Palliative Care Consult Note Telephone: [phone]  Fax: [phone]  PATIENT NAME: Ryan Day DOB: May 20, 1951 MRN: [id number]  PRIMARY CARE PROVIDER:   Valerie Roys, DO  REFERRING PROVIDER:  Valerie Roys, DO [place],  [place] [address]   I was asked by Dr Ryan Day to see Ryan Day for Palliative care consult for complex medical decision making  RESPONSIBLE PARTY:   Self; wife Ryan Day [phone]; [phone]  1. Advance Care Planning; Full code with aggressive interventions; MOST form in vynca  2. Goals of Care: Goals include to maximize quality of life and symptom management. Our advance care planning conversation included a discussion about:     The value and importance of advance care planning   Exploration of personal, cultural or spiritual beliefs that might influence medical decisions   Exploration of goals of care in the event of a sudden injury or illness   Identification and preparation of a healthcare agent   Review and updating or creation of an  advance directive document.  3. Palliative care encounter; Palliative care encounter; Palliative medicine team will continue to support patient, patient's family, and medical team. Visit consisted of counseling and education dealing with the complex and emotionally intense issues of symptom management and palliative care in the setting of serious and potentially life-threatening illness  4. F/u once d/c from hospitalization WIll need upon d/c home an extension to his hospital bed as he measures 6'5  O2 as his O2 saturation was 72% on 08/12/2020  PT/OT/Speech/RN wound care/CNA  Dietician consult for feeding less expensive as it will come out of Ryan Day pocket or one that insurance will cover  I spent 75 minutes providing this consultation,  from 2:00pm  To 3:15pm*. More than 50% of the time in this consultation was spent coordinating communication.   HISTORY OF  PRESENT ILLNESS:  Ryan Day is a 69 y.o. 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 In person initial Palliative care visit with Ryan Day, his wife and daughter Ryan Day. Ryan Day and I talked about purpose of Palliative care visit. Ryan Day in agreement. We talked about the last time Ryan Day was independent which was prior to the fall 04/2020 as he was working, driving a truck. Ryan Day endorses they have three grown daughters one being a Equities trader. We talked about past medical history. We talked about events since the fall that have resulted in two separate hospitalizations. We talked about hospitalizations. We talked about current functional abilities where Ryan Day is totalcare adl's, bathing, foley in place. Ryan Day has a G-tube which he is fed through essential to sustain life. Ryan Day endorses Ryan. Day is on a supplement that is extremely expensive and hard for them to afford. We talked about nutrition and supplements. We talked about a dietitian consult for further evaluation of caloric intake requirements, what would fit within their price range recommended. We talked about the sacral wound he has acquired this last hospitalization. Ryan Day endorses the sacral wound has become very large, purulent drainage with an odor. Ryan Day, Ryan. Day endorses they have been having to change the dressing twice a day or more depending on how much dressing is saturated. Ryan. Day has an upcoming appointment at the wound care center, willl have to get EMS to transport Ryan. Day as unable to get Ryan. Day in a  car. We talked about getting discharged home from the hospital with only Palliative care. PT/OT/ speech / RN for wound care, CNA were not ordered. We talked about these options, Ryan. Day endorses she feels like they would be very beneficial. We talked about the challenges getting to the wound care center. We talked about  primary provider appointment follow up hospitalization. Ryan. Day endorses Ryan. Day has not had primary appointment because it is too hard to get Ryan. Day to the appointments with transportation. We talked about in-home Primary care provider option. Ryan. Maziarz endorses that she thinks that would be very helpful and in agreement to fax Lauretta Grill NP documentation. We talked about increase copious secretions. Ryan Day endorses Ryan. Day breath sounds have been very coarse rhonchi as she is a Equities trader. We talked about oxygen saturation that dipped into the 70s two nights ago as they monitor is O2 saturation. Ryan Day endorses Ryan. Day was not discharged home on oxygen though he has been running down in the 80s at times. Ryan Day endorses she has had to deep suction Ryan. Day several times. We talked about medical goals of care including aggressive versus conservative versus comfort care. MOST form completed with full code and aggressive interventions. Ryan. Day endorses wishes for full code, full aggressive intervention, MOST form in [place]. Ryan. Day and I went to see Ryan. Day. We talked about Palliative purpose of Palliative care visit. We talked about how are you feeling today. We talked about symptoms of pain. Ryan Day denies pain. Ryan Day did appear labored with his breathing. O2 saturation 91% on room air with productive copious secretions, white in color. Tachycardic with heart rate 90s to low 100. We talked about recent events including hospitalizations. We talked about concern for pneumonia based on clinical presentation, frequent suctioning by his daughter Ryan Day during Palliative care visit. We talked about sacral wound, visualize with odorous purulent drainage with concern for worsening and sepsis. We talked about next step and worsening symptoms, full scope of treatment including full code. We talked about re-hospitalization. Recommendation for acute illness and concern for sepsis secondary to pneumonia and  sacral wounds possibly osteomyelitis will send by EMS to [facility]ED for further evaluation. Ryan and Ryan Dreibelbis in agreement. EMS called, transported to [facility] ED for further evaluation based on clinical presentation concern for sepsis secondary to pna, sacral wound  Palliative Care was asked to help address goals of care.   CODE STATUS: Full code  PPS: 30% HOSPICE ELIGIBILITY/DIAGNOSIS: TBD  PAST MEDICAL HISTORY:  Past Medical History:  Diagnosis Date  . Acute on chronic respiratory failure with hypoxia ([place])   . Acute renal injury due to hypovolemia ([place])   . Autonomic instability   . Cardiac arrest ([place])   . Cervical spinal cord injury, sequela ([place])   . Elevated alkaline phosphatase level   . 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 Hyperlipidemia   . Hypertension     SOCIAL HX:  Social History   Tobacco Use  . Smoking status: Never Smoker  . Smokeless tobacco: Never Used  Substance Use Topics  . Alcohol use: Yes    Comment: 1 or less    ALLERGIES: No Known Allergies   PERTINENT MEDICATIONS:  Outpatient Encounter Medications as of 08/14/2020  Medication Sig  . acetaminophen (TYLENOL) 160 MG/5ML solution Place 10-20 mLs (320-640 mg total) into feeding tube every 4 (four) hours as needed for mild pain.  Marland Kitchen ascorbic acid (VITAMIN C) 500 MG  tablet Place 1 tablet (500 mg total) into feeding tube 2 (two) times daily.  . Baclofen 5 MG TABS Place 5 mg into feeding tube 3 (three) times daily.  . bisacodyl (DULCOLAX) 10 MG suppository Place 1 suppository (10 mg total) rectally at bedtime.  . chlorhexidine (PERIDEX) 0.12 % solution 15 mLs by Mouth Rinse route 2 (two) times daily.  . collagenase (SANTYL) ointment Apply topically daily.  . famotidine (PEPCID) 20 MG tablet Place 1 tablet (20 mg total) into feeding tube daily.  . ferrous sulfate 300 (60 Fe) MG/5ML syrup Place 1.3 mLs (78 mg total) into feeding tube daily.  . fludrocortisone (FLORINEF) 0.1 MG tablet Place 2 tablets  (0.2 mg total) into feeding tube daily.  Marland Kitchen FLUoxetine (PROZAC) 20 MG/5ML solution Place 10 mLs (40 mg total) into feeding tube daily.  Marland Kitchen guaiFENesin 200 MG tablet Place 2 tablets (400 mg total) into feeding tube every 6 (six) hours.  . lidocaine (LIDODERM) 5 % Place 1 patch onto the skin daily. Remove & Discard patch within 12 hours or as directed by MD  . midodrine (PROAMATINE) 10 MG tablet Place 1 tablet (10 mg total) into feeding tube 3 (three) times daily with meals.  . Mouthwashes (MOUTH RINSE) LIQD solution 15 mLs by Mouth Rinse route 2 times daily at 12 noon and 4 pm.  . Multiple Vitamin (MULTIVITAMIN) LIQD Place 5 mLs into feeding tube daily.  . nutrition supplement, JUVEN, (JUVEN) PACK Place 1 packet into feeding tube 2 (two) times daily between meals.  . Nutritional Supplements (FEEDING SUPPLEMENT, OSMOLITE 1.5 CAL,) LIQD Place 474 mLs into feeding tube 4 (four) times daily.  . Nutritional Supplements (FEEDING SUPPLEMENT, PROSOURCE TF,) liquid Place 45 mLs into feeding tube 2 (two) times daily.  . ondansetron (ZOFRAN) 4 MG tablet Place 1 tablet (4 mg total) into feeding tube every 8 (eight) hours as needed for nausea, vomiting or refractory nausea / vomiting.  . polycarbophil (FIBERCON) 625 MG tablet Place 1 tablet (625 mg total) into feeding tube daily.  . rivaroxaban (XARELTO) 10 MG TABS tablet Take one tablet per tube daily  . saccharomyces boulardii (FLORASTOR) 250 MG capsule Place 1 capsule (250 mg total) into feeding tube 2 (two) times daily.  Marland Kitchen scopolamine (TRANSDERM-SCOP) 1 MG/3DAYS Place 1 patch (1.5 mg total) onto the skin every 3 (three) days.  . sennosides (SENOKOT) 8.8 MG/5ML syrup Place 5 mLs into feeding tube daily at 6 (six) AM.  . simethicone (MYLICON) 40 DQ/2.2WL drops Place 1.2 mLs (80 mg total) into feeding tube 4 (four) times daily.  . traZODone (DESYREL) 50 MG tablet Place 1 tablet (50 mg total) into feeding tube at bedtime.  . Water For Irrigation, Sterile (FREE  WATER) SOLN Place 400 mLs into feeding tube every 4 (four) hours. (Patient taking differently: Place 300 mLs into feeding tube every 4 (four) hours. )  . zinc sulfate 220 (50 Zn) MG capsule Place 1 capsule (220 mg total) into feeding tube daily.   No facility-administered encounter medications on file as of 08/14/2020.    PHYSICAL EXAM:   General: Severely debilitated, male Cardiovascular: regular rate and rhythm Pulmonary: anterior coarse breath sounds throughout Abdomen: soft, nontender, + bowel sounds; g-tube Neurological: quadriplegic Skin; sacral wound dressing saturated with odorous purulent drainage  Justeen Hehr Ihor Gully, NP

## 2020-08-14 NOTE — Progress Notes (Signed)
Patient on room air in no distress. Trach suctioned for very large amount of white secretions x2. Tolerated well. Trach care performed. Placed patient on 28% trach collar for humidification purposes. Patient tolerated interventions well

## 2020-08-14 NOTE — H&P (Signed)
History and Physical  Alford Gamero JSH:[id number] DOB: March 09, 1951 DOA: 08/14/2020  Referring physician: Arta Silence, MD PCP: Valerie Roys, DO  Patient coming from: Home  Chief Complaint: Shortness of breath  HPI: Ryan Day is a 69 y.o. 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  Patient also have a sacral bedsore which has been concerning to patient's wife due to malodorous and draining pus.  He denies fever, chills, vomiting, chest pain or abdominal pain. Patient was recently admitted and discharged from 11/2-11/16 due to it on chronic hypoxemic respiratory failure secondary to recurrent aspiration pneumonia and recurrent mucous plugging.  ED Course:  In the emergency department, he was intermittently tachypneic, blood pressure is soft at 97/62.  Work-up in the ED showed normal CBC and BMP except for mild leukocytosis and mild hypokalemia, lactic acid was 1.2 > 1.5.  Urinalysis was positive for large leukocytes and WBC > 50.  Chest x-ray showed partial consolidation at the right base, slightly increased compared to 08/05/2020 with suspicion for possible pneumonia.  IV daptomycin and cefepime due to presumed pneumonia and infected sacral decubitus ulcer.  IV hydration of NS 500 mL was provided.  Hospitalist was asked to admit patient for further evaluation and management.  Review of Systems: Constitutional: Negative for chills and fever.  HENT: Negative for ear pain and sore throat.   Eyes: Negative for pain and visual disturbance.  Respiratory: Positive for shortness of breath.  Negative for cough, chest  tightness   Cardiovascular: Negative for chest pain and palpitations.  Gastrointestinal: Negative for abdominal pain and vomiting.  Endocrine: Negative for polyphagia and polyuria.  Genitourinary: Negative for decreased urine volume, dysuria, enuresis Musculoskeletal: Negative for arthralgias and back pain.  Skin: Positive for malodorous and draining pus from the sacrum.   Allergic/Immunologic: Negative for immunocompromised state.  Neurological: Negative for tremors, syncope, speech difficulty  Hematological: Does not bruise/bleed easily.  All other systems reviewed and are negative   Past Medical History:  Diagnosis Date  . Acute on chronic respiratory failure with hypoxia ([place])   . Acute renal injury due to hypovolemia ([place])   . Autonomic instability   . Cardiac arrest ([place])   . Cervical spinal cord injury, sequela ([place])   . Elevated alkaline phosphatase level   . 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 Hyperlipidemia   . Hypertension    Past Surgical History:  Procedure Laterality Date  . COLONOSCOPY WITH PROPOFOL N/A 12/05/2017   Procedure: COLONOSCOPY WITH PROPOFOL;  Surgeon: Lin Landsman, MD;  Location: [facility] ENDOSCOPY;  Service: Gastroenterology;  Laterality: N/A;  . ESOPHAGOGASTRODUODENOSCOPY  12/05/2017   Procedure: ESOPHAGOGASTRODUODENOSCOPY (EGD);  Surgeon: Lin Landsman, MD;  Location: [facility] ENDOSCOPY;  Service: Gastroenterology;;    Social History:  reports that he has never smoked. He has never used smokeless tobacco. He reports current alcohol use. He reports that he does not use drugs.   No Known Allergies  Family History  Problem Relation Age of Onset  . Cancer Father        lung  . Cancer Brother        throat  . Heart disease Brother  38  . Liver cancer Brother   . Alcohol abuse Brother     Prior to Admission medications   Medication Sig Start Date End Date Taking? Authorizing Provider  acetaminophen (TYLENOL) 160 MG/5ML solution  Place 10-20 mLs (320-640 mg total) into feeding tube every 4 (four) hours as needed for mild pain. 07/18/20   Love, Ivan Anchors, PA-C  ascorbic acid (VITAMIN C) 500 MG tablet Place 1 tablet (500 mg total) into feeding tube 2 (two) times daily. 07/18/20   Love, Ivan Anchors, PA-C  Baclofen 5 MG TABS Place 5 mg into feeding tube 3 (three) times daily. 07/20/20   Love, Ivan Anchors, PA-C  bisacodyl (DULCOLAX) 10 MG suppository Place 1 suppository (10 mg total) rectally at bedtime. 07/20/20   Love, Ivan Anchors, PA-C  chlorhexidine (PERIDEX) 0.12 % solution 15 mLs by Mouth Rinse route 2 (two) times daily. 07/20/20   Love, Ivan Anchors, PA-C  collagenase (SANTYL) ointment Apply topically daily. 07/20/20   Love, Ivan Anchors, PA-C  famotidine (PEPCID) 20 MG tablet Place 1 tablet (20 mg total) into feeding tube daily. 07/20/20   Love, Ivan Anchors, PA-C  ferrous sulfate 300 (60 Fe) MG/5ML syrup Place 1.3 mLs (78 mg total) into feeding tube daily. 07/20/20   Love, Ivan Anchors, PA-C  fludrocortisone (FLORINEF) 0.1 MG tablet Place 2 tablets (0.2 mg total) into feeding tube daily. 07/20/20   Love, Ivan Anchors, PA-C  FLUoxetine (PROZAC) 20 MG/5ML solution Place 10 mLs (40 mg total) into feeding tube daily. 07/20/20   Love, Ivan Anchors, PA-C  guaiFENesin 200 MG tablet Place 2 tablets (400 mg total) into feeding tube every 6 (six) hours. 07/20/20   Love, Ivan Anchors, PA-C  lidocaine (LIDODERM) 5 % Place 1 patch onto the skin daily. Remove & Discard patch within 12 hours or as directed by MD 07/20/20   Love, Ivan Anchors, PA-C  midodrine (PROAMATINE) 10 MG tablet Place 1 tablet (10 mg total) into feeding tube 3 (three) times daily with meals. 07/20/20   Love, Ivan Anchors, PA-C  Mouthwashes (MOUTH RINSE) LIQD solution 15 mLs by Mouth Rinse route 2 times daily at 12 noon and 4 pm. 07/20/20   Love, Ivan Anchors, PA-C  Multiple Vitamin (MULTIVITAMIN) LIQD Place 5 mLs into feeding tube daily. 07/20/20   Love, Ivan Anchors, PA-C  nutrition supplement, JUVEN, (JUVEN) PACK  Place 1 packet into feeding tube 2 (two) times daily between meals. 07/20/20   Love, Ivan Anchors, PA-C  Nutritional Supplements (FEEDING SUPPLEMENT, OSMOLITE 1.5 CAL,) LIQD Place 474 mLs into feeding tube 4 (four) times daily. 07/20/20   Love, Ivan Anchors, PA-C  Nutritional Supplements (FEEDING SUPPLEMENT, PROSOURCE TF,) liquid Place 45 mLs into feeding tube 2 (two) times daily. 07/20/20   Love, Ivan Anchors, PA-C  ondansetron (ZOFRAN) 4 MG tablet Place 1 tablet (4 mg total) into feeding tube every 8 (eight) hours as needed for nausea, vomiting or refractory nausea / vomiting. 07/20/20   Love, Ivan Anchors, PA-C  polycarbophil (FIBERCON) 625 MG tablet Place 1 tablet (625 mg total) into feeding tube daily. 07/20/20   Love, Ivan Anchors, PA-C  rivaroxaban (XARELTO) 10 MG TABS tablet Take one tablet per tube daily 08/08/20   [name], MD  saccharomyces boulardii (FLORASTOR) 250 MG capsule Place 1 capsule (250 mg total) into feeding tube 2 (two) times daily. 07/20/20   Love, Ivan Anchors, PA-C  scopolamine (TRANSDERM-SCOP) 1 MG/3DAYS Place 1 patch (1.5 mg total) onto the skin every 3 (three) days. 07/20/20  Love, Ivan Anchors, PA-C  sennosides (SENOKOT) 8.8 MG/5ML syrup Place 5 mLs into feeding tube daily at 6 (six) AM. 07/20/20   Love, Ivan Anchors, PA-C  simethicone (MYLICON) 40 TI/1.4ER drops Place 1.2 mLs (80 mg total) into feeding tube 4 (four) times daily. 07/20/20   Love, Ivan Anchors, PA-C  traZODone (DESYREL) 50 MG tablet Place 1 tablet (50 mg total) into feeding tube at bedtime. 07/20/20   Love, Ivan Anchors, PA-C  Water For Irrigation, Sterile (FREE WATER) SOLN Place 400 mLs into feeding tube every 4 (four) hours. Patient taking differently: Place 300 mLs into feeding tube every 4 (four) hours.  07/20/20   Love, Ivan Anchors, PA-C  zinc sulfate 220 (50 Zn) MG capsule Place 1 capsule (220 mg total) into feeding tube daily. 07/20/20   Bary Leriche, PA-C    Physical Exam: BP (!) 106/55 (BP Location: Left Arm)   Pulse 77   Temp  98.6 F (37 C) (Oral)   Resp 17   SpO2 96%   . General: 69 y.o. year-old male, quadriplegic but in no acute distress.  Alert and oriented x3. Marland Kitchen HEENT: NCAT, EOMI . Neck: Supple, tracheostomy site noted . Cardiovascular: Regular rate and rhythm with no rubs or gallops.  No thyromegaly or JVD noted.  No lower extremity edema. 2/4 pulses in all 4 extremities. Marland Kitchen Respiratory: Clear to auscultation with no wheezes or rales. Good inspiratory effort. . Abdomen: No erythema/infection of PEG tube site noted.  Soft nontender nondistended with normal bowel sounds x4 quadrants. . Muskuloskeletal: Sacral decubitus ulcer with malodorous copious purulent drainage.  No cyanosis, clubbing or edema noted bilaterally . Neuro: CN II-XII intact, strength, sensation, reflexes . Skin:Sacral decubitus ulcer with malodorous copious purulent drainage.  Marland Kitchen Psychiatry: Judgement and insight appear normal. Mood is appropriate for condition and setting          Labs on Admission:  Basic Metabolic Panel: Recent Labs  Lab 08/08/20 0550 08/14/20 1647  NA 136 135  K 4.2 3.4*  CL 98 102  CO2 29 28  GLUCOSE 117* 97  BUN 26* 23  CREATININE 0.69 0.53*  CALCIUM 8.9 8.6*  MG 2.3  --   PHOS 2.9  --    Liver Function Tests: Recent Labs  Lab 08/08/20 0550 08/14/20 1647  AST  --  21  ALT  --  31  ALKPHOS  --  130*  BILITOT  --  0.5  PROT  --  7.4  ALBUMIN 2.1* 2.6*   No results for input(s): LIPASE, AMYLASE in the last 168 hours. No results for input(s): AMMONIA in the last 168 hours. CBC: Recent Labs  Lab 08/08/20 0550 08/14/20 1647  WBC 12.5* 11.0*  NEUTROABS  --  8.1*  HGB 8.5* 8.7*  HCT 26.5* 27.1*  MCV 87.7 88.9  PLT 343 318   Cardiac Enzymes: No results for input(s): CKTOTAL, CKMB, CKMBINDEX, TROPONINI in the last 168 hours.  BNP (last 3 results) Recent Labs    07/23/20 0343 07/25/20 1308 08/14/20 1647  BNP 123.4* 72.8 59.5    ProBNP (last 3 results) No results for input(s): PROBNP  in the last 8760 hours.  CBG: No results for input(s): GLUCAP in the last 168 hours.  Radiological Exams on Admission: DG Chest Portable 1 View  Result Date: 08/14/2020 CLINICAL DATA:  Worsening short of breath EXAM: PORTABLE CHEST 1 VIEW COMPARISON:  08/04/2020, 08/05/2020 07/11/2020, 07/25/2020 FINDINGS: Tracheostomy tube remains in place. Surgical hardware in the cervical spine. Partial  consolidation in the right base slightly increased compared with 08/05/2020. Stable cardiomediastinal silhouette. No pneumothorax. IMPRESSION: Partial consolidation at the right base, slightly increased compared to 08/05/2020, possible pneumonia. Electronically Signed   By: Donavan Foil M.D.   On: 08/14/2020 17:07    EKG: I independently viewed the EKG done and my findings are as followed: Sinus rhythm at a rate of 88 bpm  Assessment/Plan Present on Admission: . Pneumonia . Sacral decubitus ulcer, stage II ([place]) . Hyperlipidemia . Elevated alkaline phosphatase level . Neurogenic orthostatic hypotension ([place]) . Quadriplegia ([place]) . Aspiration pneumonia ([facility])  Active Problems:   Hyperlipidemia   Elevated alkaline phosphatase level   Spinal cord injury, cervical region, sequela ([facility])   Quadriplegia (H[facility])   Neurogenic orthostatic hypotension ([facility])   Sacral decubitus ulcer, stage II (H[facility])   Aspiration pneumonia (H[facility])   Pneumonia   Hypokalemia   Hypoalbuminemia   Leukocytosis  1.Presumed aspiration pneumonia/HCAP POA Chest x-ray showed partial consolidation at the right base, slightly increased compared to 08/05/2020 with suspicion for possible pneumonia PORT/PSI of 78points  indicating 0.9-2.8% mortality Patient was started with IV vancomycin and cefepime, we shall continue with same at this time with plan to de-escalate based on blood culture, sputum culture, urine Legionella and strep pneumo Continue  incentive spirometry, flutter valve and chest PT Blood culture and sputum culture  pending  2.Infected sacral decubitus ulcer Malodorous purulent drainage noted on sacral decubitus ulcer Continue IV antibiotics (vancomycin and cefepime) as indicated above Continue wound care General surgery will be consulted for possible incision and drainage Patient is n.p.o  3.Leukocytosis in the setting of #1 and 2 Continue treatment as per above  4.Hypokalemia K+ 3.4, this will be replenished  5.Hypoalbuminemia Albumin 2.6; dietitian will be consulted  6.  Hypotension Continue Midodrine  7.  Spinal cord injury, cervical region with subsequent quadriplegia Patient had C5 vertebral body fracture, C3-C6 cord compression with edema status post C3-C6 laminectomy with fusion and repair of the dural tear in 04/2020 at [facility] supportive care with bolus tube feed, trach care Continue fall precaution and neurochecks Continue PT/OT eval and treat  8.  GERD Continue famotidine per home regimen  9.  Elevated ALP ALP 130, continue to monitor liver enzymes  10.  Depression Continue Prozac  11.  Anemia (mixed;history of iron deficiency anemia) Hemoglobin stable at 8.7 Continue home ferrous sulfate Continue Dulcolax to prevent constipation   DVT prophylaxis: Xarelto  Code Status: Full code  Family Communication: Daughter at bedside (all questions answered to satisfaction)  Disposition Plan:  Patient is from:                        home Anticipated DC to:                   SNF or family members home Anticipated DC date:               2-3 days Anticipated DC barriers:          Patient is unstable to be discharged at this time due to presumed aspiration pneumonia/HCAP POA and infectious sacral decubitus ulcer with malodorous purulent drainage which require further inpatient management and surgical evaluation.   Consults called: General surgery  Admission status: Inpatient    Bernadette Hoit MD [facility]  08/15/2020, 12:13 AM
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
## 2020-08-15 ENCOUNTER — Inpatient Hospital Stay: Payer: BC Managed Care – PPO

## 2020-08-15 ENCOUNTER — Encounter: Payer: Self-pay | Admitting: Internal Medicine

## 2020-08-15 DIAGNOSIS — S14109S Unspecified injury at unspecified level of cervical spinal cord, sequela: Secondary | ICD-10-CM | POA: Diagnosis not present

## 2020-08-15 DIAGNOSIS — E8809 Other disorders of plasma-protein metabolism, not elsewhere classified: Secondary | ICD-10-CM | POA: Diagnosis present

## 2020-08-15 DIAGNOSIS — L89152 Pressure ulcer of sacral region, stage 2: Secondary | ICD-10-CM | POA: Diagnosis not present

## 2020-08-15 DIAGNOSIS — D72829 Elevated white blood cell count, unspecified: Secondary | ICD-10-CM | POA: Diagnosis present

## 2020-08-15 DIAGNOSIS — L89159 Pressure ulcer of sacral region, unspecified stage: Secondary | ICD-10-CM

## 2020-08-15 DIAGNOSIS — E876 Hypokalemia: Secondary | ICD-10-CM | POA: Diagnosis present

## 2020-08-15 LAB — CBC
HCT: 25.4 % — ABNORMAL LOW (ref 39.0–52.0)
Hemoglobin: 8.1 g/dL — ABNORMAL LOW (ref 13.0–17.0)
MCH: 28.1 pg (ref 26.0–34.0)
MCHC: 31.9 g/dL (ref 30.0–36.0)
MCV: 88.2 fL (ref 80.0–100.0)
Platelets: 307 10*3/uL (ref 150–400)
RBC: 2.88 MIL/uL — ABNORMAL LOW (ref 4.22–5.81)
RDW: 18.5 % — ABNORMAL HIGH (ref 11.5–15.5)
WBC: 10.5 10*3/uL (ref 4.0–10.5)
nRBC: 0 % (ref 0.0–0.2)

## 2020-08-15 LAB — COMPREHENSIVE METABOLIC PANEL
ALT: 28 U/L (ref 0–44)
AST: 20 U/L (ref 15–41)
Albumin: 2.3 g/dL — ABNORMAL LOW (ref 3.5–5.0)
Alkaline Phosphatase: 112 U/L (ref 38–126)
Anion gap: 9 (ref 5–15)
BUN: 18 mg/dL (ref 8–23)
CO2: 29 mmol/L (ref 22–32)
Calcium: 9.4 mg/dL (ref 8.9–10.3)
Chloride: 101 mmol/L (ref 98–111)
Creatinine, Ser: 0.6 mg/dL — ABNORMAL LOW (ref 0.61–1.24)
GFR, Estimated: >60 mL/min (ref >60)
Glucose, Bld: 101 mg/dL — ABNORMAL HIGH (ref 70–99)
Potassium: 3.9 mmol/L (ref 3.5–5.1)
Sodium: 139 mmol/L (ref 135–145)
Total Bilirubin: 0.7 mg/dL (ref 0.3–1.2)
Total Protein: 7 g/dL (ref 6.5–8.1)

## 2020-08-15 LAB — PROTIME-INR
INR: 1.2 (ref 0.8–1.2)
Prothrombin Time: 14.4 seconds (ref 11.4–15.2)

## 2020-08-15 LAB — PHOSPHORUS: Phosphorus: 3.5 mg/dL (ref 2.5–4.6)

## 2020-08-15 LAB — PROCALCITONIN: Procalcitonin: <0.1 ng/mL

## 2020-08-15 LAB — MAGNESIUM: Magnesium: 2.1 mg/dL (ref 1.7–2.4)

## 2020-08-15 LAB — APTT: aPTT: 48 seconds — ABNORMAL HIGH (ref 24–36)

## 2020-08-15 LAB — STREP PNEUMONIAE URINARY ANTIGEN: Strep Pneumo Urinary Antigen: NEGATIVE

## 2020-08-15 IMAGING — CT CT PELVIS W/ CM
2 of 3 series · 16 of 46 positions shown, 18 images · IV contrast (omnipaque)
Comparison: None

CLINICAL DATA: Sacral decubitus ulceration with suspected abscess.

EXAM:
CT PELVIS WITH CONTRAST
TECHNIQUE: Multidetector CT imaging of the pelvis was performed using the
standard protocol following the bolus administration of intravenous
contrast.
CONTRAST:  100mL OMNIPAQUE IOHEXOL 300 MG/ML  SOLN

[Series 2: routine abd/pel with · axial · 0.98mm/px · z∈[-445,-145]mm · 13 of 70 slices shown, 15 images]
[im 5/70  soft-tissue]
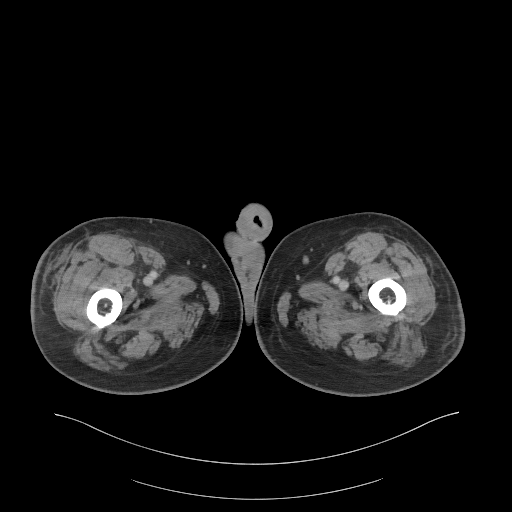
[im 5/70  bone]
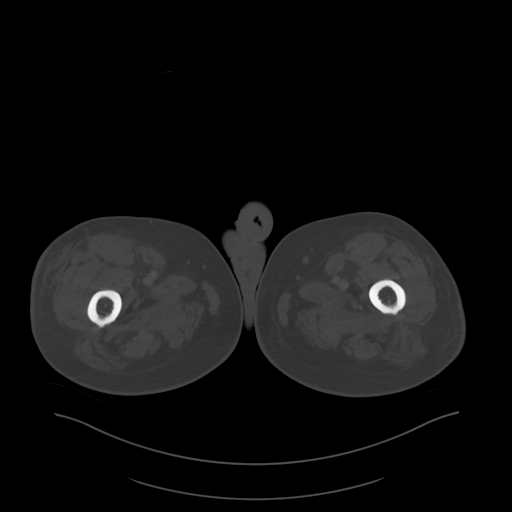
[im 9/70  soft-tissue]
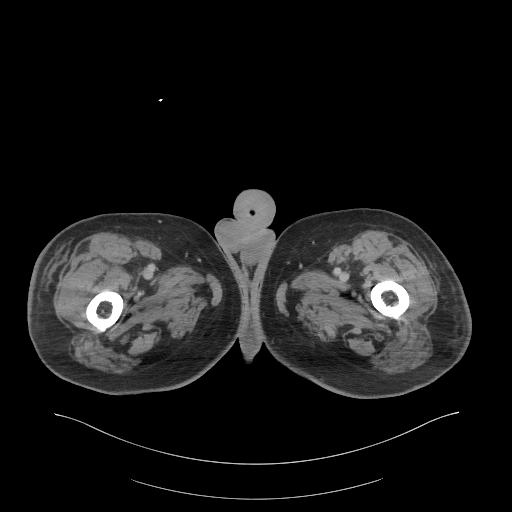
[im 14/70  soft-tissue]
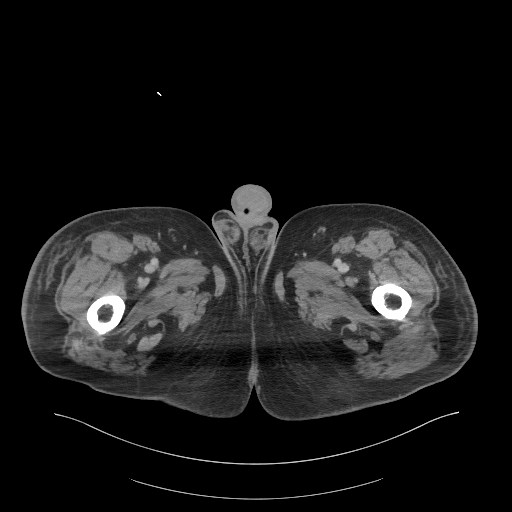
[im 21/70  soft-tissue]
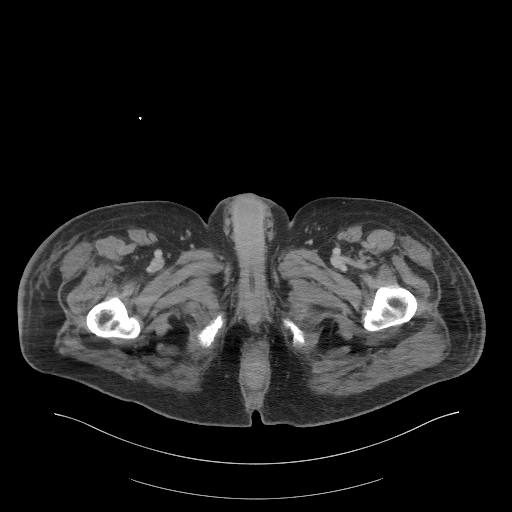
[im 25/70  soft-tissue]
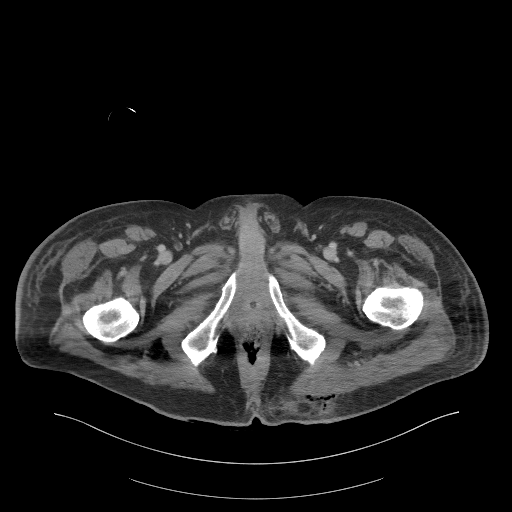
[im 29/70  soft-tissue]
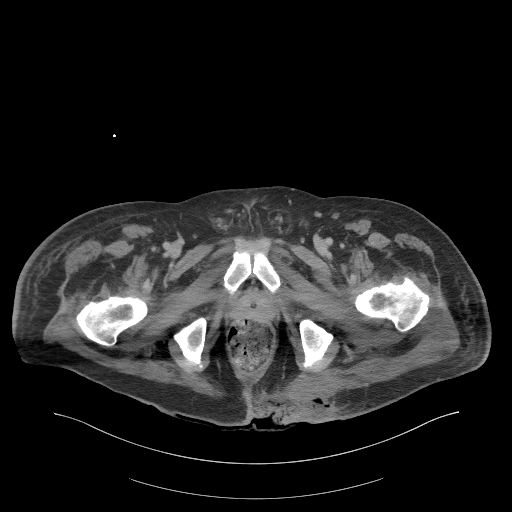
[im 36/70  soft-tissue]
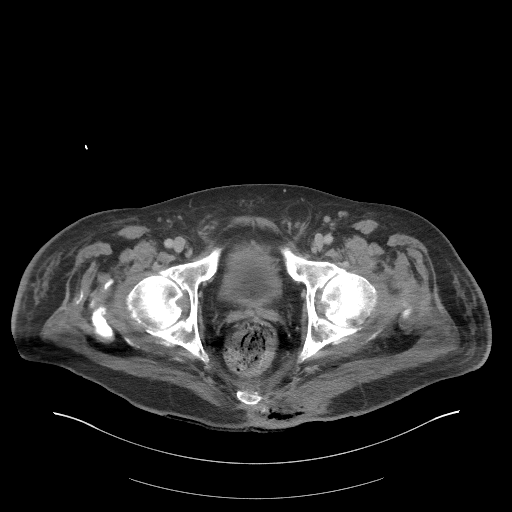
[im 41/70  soft-tissue]
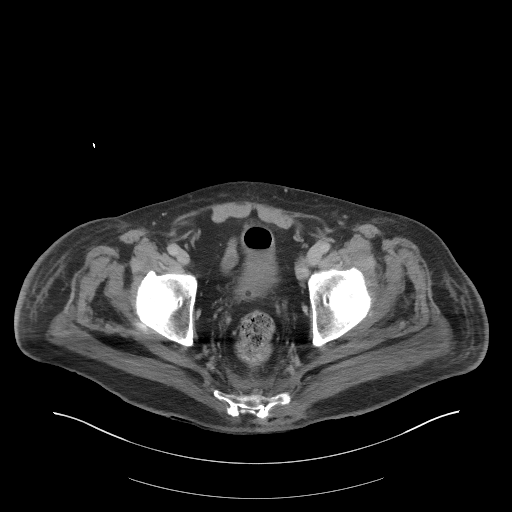
[im 45/70  soft-tissue]
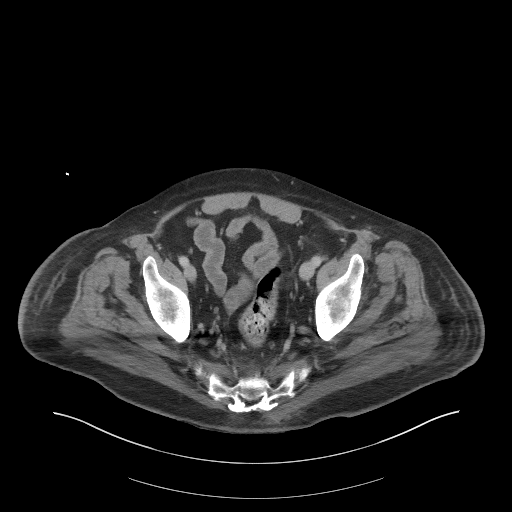
[im 45/70  bone]
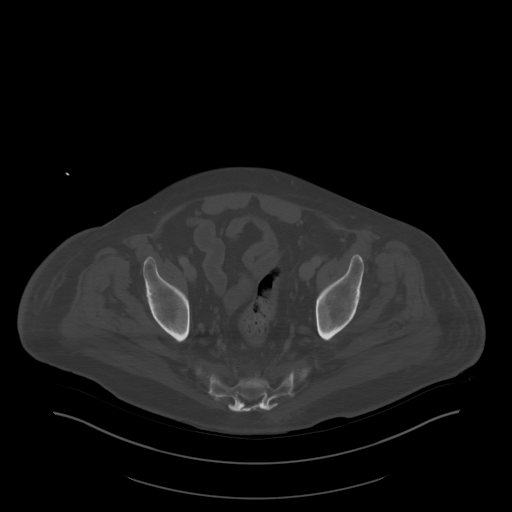
[im 49/70  soft-tissue]
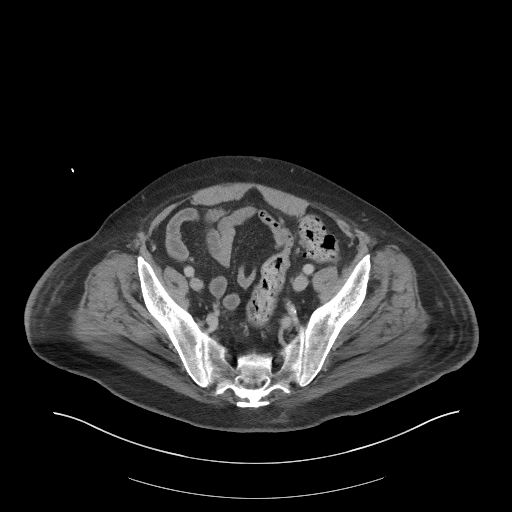
[im 56/70  soft-tissue]
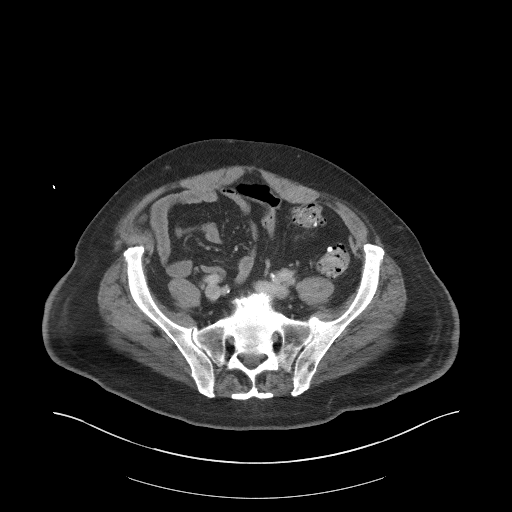
[im 61/70  soft-tissue]
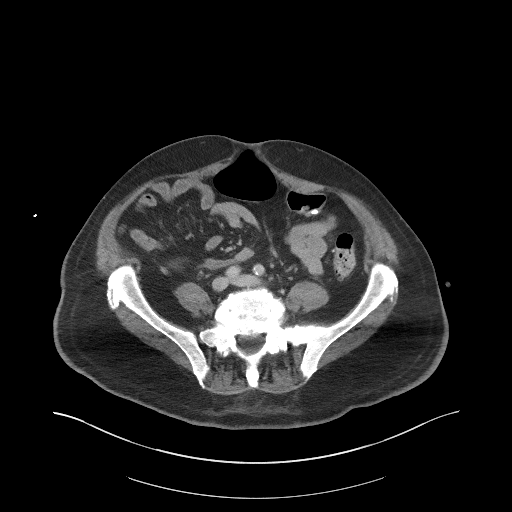
[im 65/70  soft-tissue]
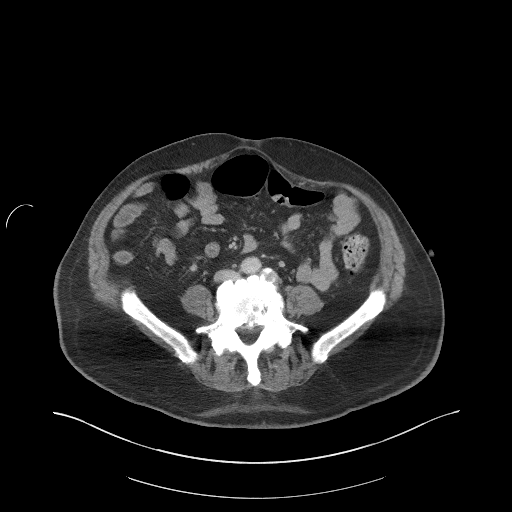

[Series 5: coronal st · coronal · 0.72mm/px · 3 of 86 slices shown]
[im 29/86  soft-tissue]
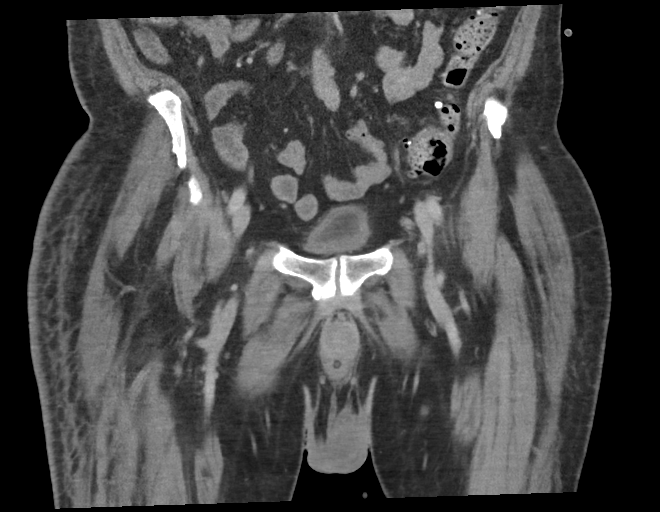
[im 38/86  soft-tissue]
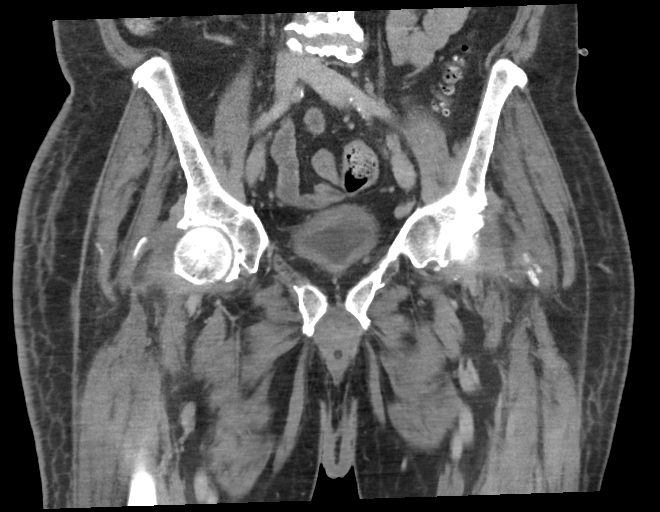
[im 48/86  soft-tissue]
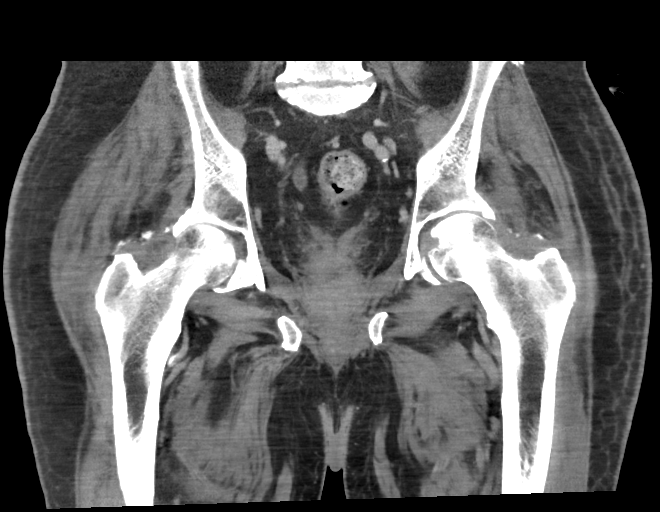

[16 of 46 positions shown; findings below may reference images not displayed]

FINDINGS: Urinary Tract: Foley catheter within the urinary bladder. Small
amount of gas in the urinary bladder. No distal ureteral dilation.

Bowel: Colonic diverticulosis. No acute gastrointestinal process to
the extent evaluated.

Visualized portions of the appendix are normal.

Vascular/Lymphatic: Calcified atheromatous plaque in the abdominal
aorta distally and tracking into the iliac vessels. Mildly enlarged
LEFT external iliac lymph nodes likely reactive.

Reproductive:  Prostate unremarkable by CT.

Other:  No ascites.

Musculoskeletal: Periosteal reaction along the sacrum at the site of
a large LEFT

Sacral decubitus ulcer. Exposed bone is likely evident based on the
appearance on CT along the cephalad margin of the ulceration with
the defect just below this level within the soft tissues. Some gas
tracking in the subcutaneous fat overlying the LEFT gluteal region
with packing material in this area as well. Area measures
approximately 6.2 by 4.5 cm in the coronal plane. Gas tracking
laterally approximately 2-3 cm away from the site of ulceration best
seen on image 44 of series 2.

Heterotopic calcification about the hips bilaterally may relate to
paraplegia.
IMPRESSION: 1. Large LEFT sacral decubitus ulcer with evidence of sacral
osteomyelitis.
2. No sign of focal fluid collection in the area but with some gas
tracking away from the site in the subcutaneous fat just to the LEFT
of the ulceration. This could relate to debridement. Correlate with
any clinical evidence of aggressive soft tissue infection.
3. Aortic atherosclerosis.

Aortic Atherosclerosis ([2L]-[2L]).

## 2020-08-15 MED ORDER — OSMOLITE 1.5 CAL PO LIQD
237.0000 mL | ORAL | Status: DC
  Administered 2020-08-15 – 2020-08-22 (×44): 237 mL

## 2020-08-15 MED ORDER — GLYCOPYRROLATE 1 MG PO TABS
1.0000 mg | ORAL_TABLET | Freq: Three times a day (TID) | ORAL | Status: DC | PRN
  Administered 2020-08-15 – 2020-08-24 (×7): 1 mg
  Filled 2020-08-15 (×10): qty 1

## 2020-08-15 MED ORDER — JUVEN PO PACK
1.0000 | PACK | Freq: Two times a day (BID) | ORAL | Status: DC
  Administered 2020-08-15 – 2020-08-25 (×20): 1

## 2020-08-15 MED ORDER — FERROUS SULFATE 220 (44 FE) MG/5ML PO ELIX
78.0000 mg | ORAL_SOLUTION | Freq: Every day | ORAL | Status: DC
  Administered 2020-08-15 – 2020-08-25 (×10): 78 mg via ORAL
  Filled 2020-08-15 (×5): qty 1.77
  Filled 2020-08-15: qty 1.8
  Filled 2020-08-15 (×3): qty 1.77
  Filled 2020-08-15: qty 1.8
  Filled 2020-08-15 (×2): qty 1.77

## 2020-08-15 MED ORDER — POTASSIUM CHLORIDE 10 MEQ/100ML IV SOLN
10.0000 meq | INTRAVENOUS | Status: AC
  Administered 2020-08-15 (×2): 10 meq via INTRAVENOUS
  Filled 2020-08-15: qty 100

## 2020-08-15 MED ORDER — SODIUM CHLORIDE 0.9 % IV SOLN
2.0000 g | Freq: Three times a day (TID) | INTRAVENOUS | Status: DC
  Administered 2020-08-15 (×2): 2 g via INTRAVENOUS
  Filled 2020-08-15 (×3): qty 2

## 2020-08-15 MED ORDER — OSMOLITE 1.5 CAL PO LIQD
474.0000 mL | Freq: Four times a day (QID) | ORAL | Status: DC
  Administered 2020-08-15: 474 mL

## 2020-08-15 MED ORDER — ACETAMINOPHEN 325 MG PO TABS
650.0000 mg | ORAL_TABLET | Freq: Four times a day (QID) | ORAL | Status: DC | PRN
  Administered 2020-08-15 – 2020-08-16 (×2): 650 mg via ORAL
  Filled 2020-08-15 (×2): qty 2

## 2020-08-15 MED ORDER — IOHEXOL 300 MG/ML  SOLN
100.0000 mL | Freq: Once | INTRAMUSCULAR | Status: AC | PRN
  Administered 2020-08-15: 100 mL via INTRAVENOUS

## 2020-08-15 MED ORDER — VANCOMYCIN HCL IN DEXTROSE 1-5 GM/200ML-% IV SOLN
1000.0000 mg | Freq: Two times a day (BID) | INTRAVENOUS | Status: DC
  Administered 2020-08-15: 1000 mg via INTRAVENOUS
  Filled 2020-08-15 (×2): qty 200

## 2020-08-15 NOTE — Progress Notes (Signed)
Patient remains on 28% trach collar in no distress responding appropriately to my questions. Continues to have very large amounts of secretions from trach as I have suctioned him twice at this point in my shift. Have used percussor once however patient is able to mobilize secretions well on his own. Performed trach care as well. Will continue to monitor.
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
## 2020-08-15 NOTE — Progress Notes (Signed)
[facility] liaison Note:  Patient is currently followed by [facility] outpatient Palliative program at home. TOC Misty Green made aware. Will follow for disposition. Flo Shanks BSN, RN, [place] [facility] [phone]
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
## 2020-08-15 NOTE — Progress Notes (Signed)
Patient suctioned thick white copious secretions. Hand delivered sputum sample to lab for processing

## 2020-08-15 NOTE — Progress Notes (Signed)
OT Cancellation Note  Patient Details Name: Ryan Day MRN: [id number] DOB: 04/11/1951   Cancelled Treatment:    Reason Eval/Treat Not Completed: OT screened, no needs identified, will sign off  OT consult received and chart reviewed. Upon chart review and in speaking with patient, he has recently had an inpatient rehab stay. He and his spouse have learned hoyer lift and electric w/c mgt techniques as well as positioning in bed for pressure relief and ROM for his joints. Pt reports that there are no new developments with his level of mobility or self care that would warrant re-starting therapy in the acute setting at this time. Will complete order and sign-off at this time. Please re-consult should new functional needs arise.   Gerrianne Scale, [place], OTR/L ascom [phone] 08/15/20, 10:43 AM
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
## 2020-08-15 NOTE — Consult Note (Signed)
[facility] SURGICAL CONSULTATION NOTE (initial) - cpt: 99254   HISTORY OF PRESENT ILLNESS (HPI):  69 y.o. male presented to [facility] ED overnight for evaluation of SOB. 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 At time of presentation, daughter had reported around a 2-3 day history of increasing SOB with an increase production in sputum. This was reportedly clear in nature. Patient does have a recent admission (11/12 - 11/16) hypoxic respiratory failure secondary to aspiration PNA and mucus plugging. Additionally, his family reported a history of sacral ulceration which has been worsening to their evaluations at home. This was evaluated on his recent admission and noted to be a stage II; however, on review of [place] RN notes from 11/03, this was unstageable with slough present. He was given Santyl for home care. His wife is very frustrated as she feels his sacral wound was exacerbated by poor care during his lat admission. Palliative care was involved in his last admission, and it appears he, and his wife, wanted to continue full scope and palliative to follow as outpatient. Work up in the [place] yesterday revealed a mild leukocytosis to 11.0k (now 10.5k), normal renal function with sCr - 0.53, mild hypokalemia to 3.4 (now 3.9), UA concerning for UTI with + leukocytes, BCx negative x2 to date, and CXR was concerning for possible PNA. He was started on IV vancomycin and cefepime and admitted to the hospitalist service.   Surgery is consulted by hospitalist physician Dr. Bernadette Hoit, DO in this context for evaluation and management of sacral ulceration.  PAST MEDICAL HISTORY (PMH):  Past Medical History:  Diagnosis Date  . Acute on chronic respiratory failure with hypoxia ([place])   . Acute renal injury due to hypovolemia ([place])   . Autonomic instability   . Cardiac arrest ([place])   . Cervical spinal cord injury, sequela ([place])     . Elevated alkaline phosphatase level   . 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 Hyperlipidemia   . Hypertension      PAST SURGICAL HISTORY ([place]):  Past Surgical History:  Procedure Laterality Date  . COLONOSCOPY WITH PROPOFOL N/A 12/05/2017   Procedure: COLONOSCOPY WITH PROPOFOL;  Surgeon: Lin Landsman, MD;  Location: [facility] ENDOSCOPY;  Service: Gastroenterology;  Laterality: N/A;  . ESOPHAGOGASTRODUODENOSCOPY  12/05/2017   Procedure: ESOPHAGOGASTRODUODENOSCOPY (EGD);  Surgeon: Lin Landsman, MD;  Location: [facility] ENDOSCOPY;  Service: Gastroenterology;;     MEDICATIONS:  Prior to Admission medications   Medication Sig Start Date End Date Taking? Authorizing Provider  acetaminophen (TYLENOL) 160 MG/5ML solution Place 10-20 mLs (320-640 mg total) into feeding tube every 4 (four) hours as needed for mild pain. 07/18/20   Love, Ivan Anchors, PA-C  ascorbic acid (VITAMIN C) 500 MG tablet Place 1 tablet (500 mg total) into feeding tube 2 (two) times daily. 07/18/20   Love, Ivan Anchors, PA-C  Baclofen 5 MG TABS Place 5 mg into feeding tube 3 (three) times daily. 07/20/20   Love, Ivan Anchors, PA-C  bisacodyl (DULCOLAX) 10 MG suppository Place 1 suppository (10 mg total) rectally at bedtime. 07/20/20   Love, Ivan Anchors, PA-C  chlorhexidine (PERIDEX) 0.12 % solution 15 mLs by Mouth Rinse route 2 (two) times daily. 07/20/20   Love, Ivan Anchors, PA-C  collagenase (SANTYL) ointment Apply topically daily. 07/20/20   Love, Ivan Anchors, PA-C  famotidine (PEPCID) 20 MG tablet Place 1 tablet (20 mg total) into  feeding tube daily. 07/20/20   Love, Ivan Anchors, PA-C  ferrous sulfate 300 (60 Fe) MG/5ML syrup Place 1.3 mLs (78 mg total) into feeding tube daily. 07/20/20   Love, Ivan Anchors, PA-C  fludrocortisone (FLORINEF) 0.1 MG tablet Place 2 tablets (0.2 mg total) into feeding tube daily. 07/20/20   Love, Ivan Anchors, PA-C  FLUoxetine (PROZAC) 20 MG/5ML solution Place 10 mLs (40 mg total) into feeding tube daily.  07/20/20   Love, Ivan Anchors, PA-C  guaiFENesin 200 MG tablet Place 2 tablets (400 mg total) into feeding tube every 6 (six) hours. 07/20/20   Love, Ivan Anchors, PA-C  lidocaine (LIDODERM) 5 % Place 1 patch onto the skin daily. Remove & Discard patch within 12 hours or as directed by MD 07/20/20   Love, Ivan Anchors, PA-C  midodrine (PROAMATINE) 10 MG tablet Place 1 tablet (10 mg total) into feeding tube 3 (three) times daily with meals. 07/20/20   Love, Ivan Anchors, PA-C  Mouthwashes (MOUTH RINSE) LIQD solution 15 mLs by Mouth Rinse route 2 times daily at 12 noon and 4 pm. 07/20/20   Love, Ivan Anchors, PA-C  Multiple Vitamin (MULTIVITAMIN) LIQD Place 5 mLs into feeding tube daily. 07/20/20   Love, Ivan Anchors, PA-C  nutrition supplement, JUVEN, (JUVEN) PACK Place 1 packet into feeding tube 2 (two) times daily between meals. 07/20/20   Love, Ivan Anchors, PA-C  Nutritional Supplements (FEEDING SUPPLEMENT, OSMOLITE 1.5 CAL,) LIQD Place 474 mLs into feeding tube 4 (four) times daily. 07/20/20   Love, Ivan Anchors, PA-C  Nutritional Supplements (FEEDING SUPPLEMENT, PROSOURCE TF,) liquid Place 45 mLs into feeding tube 2 (two) times daily. 07/20/20   Love, Ivan Anchors, PA-C  ondansetron (ZOFRAN) 4 MG tablet Place 1 tablet (4 mg total) into feeding tube every 8 (eight) hours as needed for nausea, vomiting or refractory nausea / vomiting. 07/20/20   Love, Ivan Anchors, PA-C  polycarbophil (FIBERCON) 625 MG tablet Place 1 tablet (625 mg total) into feeding tube daily. 07/20/20   Love, Ivan Anchors, PA-C  rivaroxaban (XARELTO) 10 MG TABS tablet Take one tablet per tube daily 08/08/20   [name], MD  saccharomyces boulardii (FLORASTOR) 250 MG capsule Place 1 capsule (250 mg total) into feeding tube 2 (two) times daily. 07/20/20   Love, Ivan Anchors, PA-C  scopolamine (TRANSDERM-SCOP) 1 MG/3DAYS Place 1 patch (1.5 mg total) onto the skin every 3 (three) days. 07/20/20   Love, Ivan Anchors, PA-C  sennosides (SENOKOT) 8.8 MG/5ML syrup Place 5 mLs into  feeding tube daily at 6 (six) AM. 07/20/20   Love, Ivan Anchors, PA-C  simethicone (MYLICON) 40 ZO/1.0RU drops Place 1.2 mLs (80 mg total) into feeding tube 4 (four) times daily. 07/20/20   Love, Ivan Anchors, PA-C  traZODone (DESYREL) 50 MG tablet Place 1 tablet (50 mg total) into feeding tube at bedtime. 07/20/20   Love, Ivan Anchors, PA-C  Water For Irrigation, Sterile (FREE WATER) SOLN Place 400 mLs into feeding tube every 4 (four) hours. Patient taking differently: Place 300 mLs into feeding tube every 4 (four) hours.  07/20/20   Love, Ivan Anchors, PA-C  zinc sulfate 220 (50 Zn) MG capsule Place 1 capsule (220 mg total) into feeding tube daily. 07/20/20   Love, Ivan Anchors, PA-C     ALLERGIES:  No Known Allergies   SOCIAL HISTORY:  Social History   Socioeconomic History  . Marital status: Married    Spouse name: Not on file  . Number of children: Not on file  .  Years of education: Not on file  . Highest education level: Not on file  Occupational History  . Not on file  Tobacco Use  . Smoking status: Never Smoker  . Smokeless tobacco: Never Used  Substance and Sexual Activity  . Alcohol use: Yes    Comment: 1 or less  . Drug use: No  . Sexual activity: Not on file  Other Topics Concern  . Not on file  Social History Narrative  . Not on file   Social Determinants of Health   Financial Resource Strain:   . Difficulty of Paying Living Expenses: Not on file  Food Insecurity:   . Worried About Charity fundraiser in the Last Year: Not on file  . Ran Out of Food in the Last Year: Not on file  Transportation Needs:   . Lack of Transportation (Medical): Not on file  . Lack of Transportation (Non-Medical): Not on file  Physical Activity:   . Days of Exercise per Week: Not on file  . Minutes of Exercise per Session: Not on file  Stress:   . Feeling of Stress : Not on file  Social Connections:   . Frequency of Communication with Friends and Family: Not on file  . Frequency of Social  Gatherings with Friends and Family: Not on file  . Attends Religious Services: Not on file  . Active Member of Clubs or Organizations: Not on file  . Attends Archivist Meetings: Not on file  . Marital Status: Not on file  Intimate Partner Violence:   . Fear of Current or Ex-Partner: Not on file  . Emotionally Abused: Not on file  . Physically Abused: Not on file  . Sexually Abused: Not on file     FAMILY HISTORY:  Family History  Problem Relation Age of Onset  . Cancer Father        lung  . Cancer Brother        throat  . Heart disease Brother 71  . Liver cancer Brother   . Alcohol abuse Brother       REVIEW OF SYSTEMS:  Review of Systems  Constitutional: Negative for chills and fever.  Respiratory: Positive for sputum production.   Skin:       + Sacral wound  All other systems reviewed and are negative.   VITAL SIGNS:  Temp:  [98.1 F (36.7 C)-98.8 F (37.1 C)] 98.1 F (36.7 C) (11/23 0532) Pulse Rate:  [71-86] 76 (11/23 0532) Resp:  [17-23] 17 (11/23 0532) BP: (95-126)/(54-71) 95/54 (11/23 0532) SpO2:  [96 %-100 %] 100 % (11/23 0532) Weight:  [113.1 kg] 113.1 kg (11/23 0505)     Height: 6\' 5"  (195.6 cm) Weight: 113.1 kg BMI (Calculated): 29.56   INTAKE/OUTPUT:  11/22 0701 - 11/23 0700 In: 1100.9 [IV Piggyback:1100.9] Out: 700 [Urine:700]  PHYSICAL EXAM:  Physical Exam Vitals and nursing note reviewed.  Constitutional:      General: He is not in acute distress.    Appearance: He is well-developed. He is not ill-appearing.  HENT:     Head: Normocephalic.     Mouth/Throat:     Comments: Tracheostomy in place, on ventilator, active clear sputum production Cardiovascular:     Rate and Rhythm: Normal rate and regular rhythm.  Pulmonary:     Comments: On ventilator Abdominal:     Comments: PEG in place, site CDI Unable to assess tenderness secondary to quadriplegia   Musculoskeletal:     Right lower leg: No  edema.     Left lower leg: No  edema.  Skin:    General: Skin is warm and dry.     Findings: Wound (Sacrum) present.          Comments: Patient with a 2 x 2 cm ulceration to the sacrum, from what I can tell this is stage II-III, however, this wound appears to track 2-3 cm to the left lateral portion. There is copious amounts of purulence and air expressible from the wound in this region. I am concerned that this is more extensive than shown on the surface. The surrounding skin is without erythema.   Neurological:     General: No focal deficit present.     Mental Status: He is alert.     Comments: At baseline    Sacral Wound (08/15/2020):      Labs:  CBC Latest Ref Rng & Units 08/15/2020 08/14/2020 08/08/2020  WBC 4.0 - 10.5 K/uL 10.5 11.0(H) 12.5(H)  Hemoglobin 13.0 - 17.0 g/dL 8.1(L) 8.7(L) 8.5(L)  Hematocrit 39 - 52 % 25.4(L) 27.1(L) 26.5(L)  Platelets 150 - 400 K/uL 307 318 343   CMP Latest Ref Rng & Units 08/15/2020 08/14/2020 08/08/2020  Glucose 70 - 99 mg/dL 101(H) 97 117(H)  BUN 8 - 23 mg/dL 18 23 26(H)  Creatinine 0.61 - 1.24 mg/dL 0.60(L) 0.53(L) 0.69  Sodium 135 - 145 mmol/L 139 135 136  Potassium 3.5 - 5.1 mmol/L 3.9 3.4(L) 4.2  Chloride 98 - 111 mmol/L 101 102 98  CO2 22 - 32 mmol/L 29 28 29   Calcium 8.9 - 10.3 mg/dL 9.4 8.6(L) 8.9  Total Protein 6.5 - 8.1 g/dL 7.0 7.4 -  Total Bilirubin 0.3 - 1.2 mg/dL 0.7 0.5 -  Alkaline Phos 38 - 126 U/L 112 130(H) -  AST 15 - 41 U/L 20 21 -  ALT 0 - 44 U/L 28 31 -     Imaging studies:  No pertinent imaging studies at this time   Assessment/Plan: (ICD-10's: L89.159) 69 y.o. 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   - I will plan on obtaining CT Pelvis with contrast this morning to further evaluate the severity of his wound and rule out significant underlying abscess /  infection.   - I do feel that this wound is worse than what is seen on the surface given the purulent drainage and tunneling on examination. He will likely require formal debridement in the OR rather than at bedside. Family is in agreement with this, and we will tentatively plan for debridement in the OR tomorrow (11/24) with Dr Celine Ahr.   - Continue local wound care; wet-to-dry dressing, q2 hr repositioning, and low air loss mattress   - Continue broad spectrum ABx (Vancomycin + Cefepime); will obtain Cx in OR tomorrow  - Okay for tube feedings today; hold at midnight  - DVT prophylaxis; Hold for OR  - Further management per primary service; discussed recommendations with Dr Billie Ruddy   All of the above findings and recommendations were discussed with the patient, patient's family (wife at bedside, daughters on the phone) and all of their questions were answered to their expressed satisfaction.  Thank you for the opportunity to participate in this patient's care.   Face-to-face time spent with the patient, patient's family, and care providers was 80 minutes, with more than 50% of the time spent counseling, educating, and coordinating  care of the patient.    -- Edison Simon, PA-C [facility] 08/15/2020, 7:40 AM [phone] M-F: 7am - 4pm

## 2020-08-15 NOTE — Progress Notes (Signed)
Pharmacy Antibiotic Note  Ryan Day is a 69 y.o. male admitted on 08/14/2020 with pneumonia.  Pharmacy has been consulted for Vancomycin and Cefepime dosing.  Plan: Cefepime 2gm IV q8hrs Vancomycin 1gm IV q12hrs (per nomogram)     Temp (24hrs), Avg:98.6 F (37 C), Min:98.1 F (36.7 C), Max:98.8 F (37.1 C)  Recent Labs  Lab 08/08/20 0550 08/14/20 1647 08/14/20 1649 08/14/20 2224  WBC 12.5* 11.0*  --   --   CREATININE 0.69 0.53*  --   --   LATICACIDVEN  --   --  1.2 1.5    Estimated Creatinine Clearance: 122.4 mL/min (A) (by C-G formula based on SCr of 0.53 mg/dL (L)).    No Known Allergies  Antimicrobials this admission:   >>    >>   Dose adjustments this admission:   Microbiology results:  BCx:   UCx:    Sputum:    MRSA PCR:   Thank you for allowing pharmacy to be a part of this patient's care.  Hart Robinsons A 08/15/2020 12:36 AM

## 2020-08-16 ENCOUNTER — Encounter (HOSPITAL_BASED_OUTPATIENT_CLINIC_OR_DEPARTMENT_OTHER): Payer: BC Managed Care – PPO | Admitting: Physician Assistant

## 2020-08-16 ENCOUNTER — Inpatient Hospital Stay: Payer: BC Managed Care – PPO | Admitting: Anesthesiology

## 2020-08-16 ENCOUNTER — Encounter
Admission: EM | Disposition: A | Discharge status: Home Health | Payer: Self-pay | Source: Home / Self Care | Attending: Family Medicine

## 2020-08-16 ENCOUNTER — Inpatient Hospital Stay: Payer: BC Managed Care – PPO | Admitting: Family Medicine

## 2020-08-16 ENCOUNTER — Encounter: Payer: Self-pay | Admitting: Internal Medicine

## 2020-08-16 DIAGNOSIS — M4628 Osteomyelitis of vertebra, sacral and sacrococcygeal region: Secondary | ICD-10-CM | POA: Diagnosis not present

## 2020-08-16 DIAGNOSIS — L89154 Pressure ulcer of sacral region, stage 4: Secondary | ICD-10-CM | POA: Diagnosis not present

## 2020-08-16 DIAGNOSIS — L89153 Pressure ulcer of sacral region, stage 3: Secondary | ICD-10-CM | POA: Diagnosis present

## 2020-08-16 HISTORY — PX: INCISION AND DRAINAGE ABSCESS: SHX5864

## 2020-08-16 HISTORY — PX: APPLICATION OF WOUND VAC: SHX5189

## 2020-08-16 LAB — LEGIONELLA PNEUMOPHILA SEROGP 1 UR AG: L. pneumophila Serogp 1 Ur Ag: NEGATIVE

## 2020-08-16 LAB — BASIC METABOLIC PANEL
Anion gap: 9 (ref 5–15)
BUN: 18 mg/dL (ref 8–23)
CO2: 29 mmol/L (ref 22–32)
Calcium: 9.4 mg/dL (ref 8.9–10.3)
Chloride: 102 mmol/L (ref 98–111)
Creatinine, Ser: 0.79 mg/dL (ref 0.61–1.24)
GFR, Estimated: >60 mL/min (ref >60)
Glucose, Bld: 137 mg/dL — ABNORMAL HIGH (ref 70–99)
Potassium: 3.9 mmol/L (ref 3.5–5.1)
Sodium: 140 mmol/L (ref 135–145)

## 2020-08-16 LAB — CBC
HCT: 26.3 % — ABNORMAL LOW (ref 39.0–52.0)
Hemoglobin: 8.5 g/dL — ABNORMAL LOW (ref 13.0–17.0)
MCH: 28.3 pg (ref 26.0–34.0)
MCHC: 32.3 g/dL (ref 30.0–36.0)
MCV: 87.7 fL (ref 80.0–100.0)
Platelets: 292 10*3/uL (ref 150–400)
RBC: 3 MIL/uL — ABNORMAL LOW (ref 4.22–5.81)
RDW: 18.8 % — ABNORMAL HIGH (ref 11.5–15.5)
WBC: 18.8 10*3/uL — ABNORMAL HIGH (ref 4.0–10.5)
nRBC: 0 % (ref 0.0–0.2)

## 2020-08-16 LAB — MAGNESIUM: Magnesium: 2.1 mg/dL (ref 1.7–2.4)

## 2020-08-16 SURGERY — INCISION AND DRAINAGE, ABSCESS
Anesthesia: General

## 2020-08-16 MED ORDER — VASOPRESSIN 20 UNIT/ML IV SOLN
INTRAVENOUS | Status: DC | PRN
  Administered 2020-08-16: 2 [IU] via INTRAVENOUS

## 2020-08-16 MED ORDER — MIDAZOLAM HCL 2 MG/2ML IJ SOLN
INTRAMUSCULAR | Status: DC | PRN
  Administered 2020-08-16: 1 mg via INTRAVENOUS

## 2020-08-16 MED ORDER — DEXAMETHASONE SODIUM PHOSPHATE 10 MG/ML IJ SOLN
INTRAMUSCULAR | Status: DC | PRN
  Administered 2020-08-16: 10 mg via INTRAVENOUS

## 2020-08-16 MED ORDER — VANCOMYCIN HCL IN DEXTROSE 1-5 GM/200ML-% IV SOLN
1000.0000 mg | Freq: Two times a day (BID) | INTRAVENOUS | Status: DC
  Administered 2020-08-17 – 2020-08-18 (×3): 1000 mg via INTRAVENOUS
  Filled 2020-08-16 (×5): qty 200

## 2020-08-16 MED ORDER — LACTATED RINGERS IV SOLN: INTRAVENOUS | Status: DC | PRN

## 2020-08-16 MED ORDER — VANCOMYCIN HCL 10 G IV SOLR
2500.0000 mg | Freq: Once | INTRAVENOUS | Status: AC
  Administered 2020-08-16: 2500 mg via INTRAVENOUS
  Filled 2020-08-16: qty 2000

## 2020-08-16 MED ORDER — GLYCOPYRROLATE 0.2 MG/ML IJ SOLN
INTRAMUSCULAR | Status: DC | PRN
  Administered 2020-08-16: .2 mg via INTRAVENOUS

## 2020-08-16 MED ORDER — PHENYLEPHRINE HCL (PRESSORS) 10 MG/ML IV SOLN
INTRAVENOUS | Status: DC | PRN
  Administered 2020-08-16: 200 ug via INTRAVENOUS

## 2020-08-16 MED ORDER — VANCOMYCIN HCL 1000 MG IV SOLR
1000.0000 mg | Freq: Once | INTRAVENOUS | Status: DC
  Filled 2020-08-16: qty 1000

## 2020-08-16 MED ORDER — OXYCODONE HCL 5 MG/5ML PO SOLN: 5.0000 mg | Freq: Once | ORAL | Status: DC | PRN

## 2020-08-16 MED ORDER — FENTANYL CITRATE (PF) 100 MCG/2ML IJ SOLN: 25.0000 ug | INTRAMUSCULAR | Status: DC | PRN

## 2020-08-16 MED ORDER — CHLORHEXIDINE GLUCONATE CLOTH 2 % EX PADS
6.0000 | MEDICATED_PAD | Freq: Every day | CUTANEOUS | Status: DC
  Administered 2020-08-17 – 2020-08-25 (×9): 6 via TOPICAL

## 2020-08-16 MED ORDER — VANCOMYCIN HCL IN DEXTROSE 1-5 GM/200ML-% IV SOLN: 1000.0000 mg | Freq: Once | INTRAVENOUS | Status: DC

## 2020-08-16 MED ORDER — FENTANYL CITRATE (PF) 100 MCG/2ML IJ SOLN
INTRAMUSCULAR | Status: AC
  Filled 2020-08-16: qty 2

## 2020-08-16 MED ORDER — EPHEDRINE SULFATE 50 MG/ML IJ SOLN
INTRAMUSCULAR | Status: DC | PRN
  Administered 2020-08-16: 10 mg via INTRAVENOUS

## 2020-08-16 MED ORDER — ONDANSETRON HCL 4 MG/2ML IJ SOLN
INTRAMUSCULAR | Status: DC | PRN
  Administered 2020-08-16: 4 mg via INTRAVENOUS

## 2020-08-16 MED ORDER — ACETAMINOPHEN 325 MG PO TABS
650.0000 mg | ORAL_TABLET | Freq: Four times a day (QID) | ORAL | Status: DC | PRN
  Administered 2020-08-20 – 2020-08-24 (×3): 650 mg
  Filled 2020-08-16 (×3): qty 2

## 2020-08-16 MED ORDER — OXYCODONE HCL 5 MG PO TABS: 5.0000 mg | ORAL_TABLET | Freq: Once | ORAL | Status: DC | PRN

## 2020-08-16 MED ORDER — PROPOFOL 10 MG/ML IV BOLUS
INTRAVENOUS | Status: DC | PRN
  Administered 2020-08-16: 30 mg via INTRAVENOUS

## 2020-08-16 MED ORDER — PIPERACILLIN-TAZOBACTAM 3.375 G IVPB
3.3750 g | Freq: Three times a day (TID) | INTRAVENOUS | Status: DC
  Administered 2020-08-16 – 2020-08-25 (×27): 3.375 g via INTRAVENOUS
  Filled 2020-08-16 (×27): qty 50

## 2020-08-16 MED ORDER — FENTANYL CITRATE (PF) 100 MCG/2ML IJ SOLN
INTRAMUSCULAR | Status: DC | PRN
  Administered 2020-08-16 (×2): 50 ug via INTRAVENOUS

## 2020-08-16 MED ORDER — MIDAZOLAM HCL 2 MG/2ML IJ SOLN
INTRAMUSCULAR | Status: AC
  Filled 2020-08-16: qty 2

## 2020-08-16 MED ORDER — ONDANSETRON HCL 4 MG/2ML IJ SOLN: 4.0000 mg | Freq: Once | INTRAMUSCULAR | Status: DC | PRN

## 2020-08-16 MED ORDER — LACTATED RINGERS IV SOLN: Freq: Once | INTRAVENOUS | Status: AC

## 2020-08-16 SURGICAL SUPPLY — 24 items
CANISTER SUCT 1200ML W/VALVE (MISCELLANEOUS) ×2 IMPLANT
CANISTER WOUND CARE 500ML ATS (WOUND CARE) ×2 IMPLANT
COVER WAND RF STERILE (DRAPES) ×2 IMPLANT
DRAPE LAPAROTOMY 100X77 ABD (DRAPES) ×2 IMPLANT
DRSG TELFA 4X3 1S NADH ST (GAUZE/BANDAGES/DRESSINGS) ×2 IMPLANT
DRSG VAC ATS MED SENSATRAC (GAUZE/BANDAGES/DRESSINGS) ×4 IMPLANT
DRSG VERSA FOAM LRG 10X15 (GAUZE/BANDAGES/DRESSINGS) ×2 IMPLANT
ELECT REM PT RETURN 9FT ADLT (ELECTROSURGICAL) ×2
ELECTRODE REM PT RTRN 9FT ADLT (ELECTROSURGICAL) ×1 IMPLANT
GLOVE BIO SURGEON STRL SZ 6.5 (GLOVE) ×2 IMPLANT
GLOVE INDICATOR 7.0 STRL GRN (GLOVE) ×4 IMPLANT
GOWN STRL REUS W/ TWL LRG LVL3 (GOWN DISPOSABLE) ×2 IMPLANT
GOWN STRL REUS W/TWL LRG LVL3 (GOWN DISPOSABLE) ×2
KIT DRSG VAC SLVR GRANUFM (MISCELLANEOUS) ×2 IMPLANT
KIT TURNOVER KIT A (KITS) ×2 IMPLANT
MANIFOLD NEPTUNE II (INSTRUMENTS) ×2 IMPLANT
PACK BASIN MINOR (MISCELLANEOUS) ×2 IMPLANT
PULSAVAC PLUS IRRIG FAN TIP (DISPOSABLE)
SOL .9 NS 3000ML IRR  AL (IV SOLUTION) ×1
SOL .9 NS 3000ML IRR UROMATIC (IV SOLUTION) ×1 IMPLANT
SOL PREP PVP 2OZ (MISCELLANEOUS) ×2
SOLUTION PREP PVP 2OZ (MISCELLANEOUS) ×1 IMPLANT
SPONGE LAP 18X18 RF (DISPOSABLE) ×2 IMPLANT
TIP FAN IRRIG PULSAVAC PLUS (DISPOSABLE) IMPLANT

## 2020-08-16 NOTE — Progress Notes (Addendum)
[facility] Day(s): 2.   Post op day(s): Day of Surgery.   Interval History:  Patient seen and examined no acute events or new complaints overnight.  He did have fever to 100.5 in last 24 hours Labs are pending this morning NPO this morning Plan for I&d in the OR with Dr Celine Ahr today  Vital signs in last 24 hours: [min-max] current  Temp:  [99.1 F (37.3 C)-100.5 F (38.1 C)] 100.3 F (37.9 C) (11/24 0357) Pulse Rate:  [82-94] 82 (11/24 0357) Resp:  [15-18] 16 (11/24 0357) BP: (88-138)/(53-72) 130/72 (11/24 0357) SpO2:  [97 %-100 %] 99 % (11/24 0357) FiO2 (%):  [28 %] 28 % (11/24 0357)     Height: 6\' 5"  (195.6 cm) Weight: 113.1 kg BMI (Calculated): 29.56   Intake/Output last 2 shifts:  11/23 0701 - 11/24 0700 In: 504 [NG/GT:474] Out: 2650 [Urine:2650]   Physical Exam:  Constitutional: alert, cooperative and no distress  HEENT: Tracheostomy present Respiratory: On ventilator Cardiovascular: regular rate and sinus rhythm  Integumentary: Sacral wound, purulent drainage, formal evaluation in OR today  Labs:  CBC Latest Ref Rng & Units 08/15/2020 08/14/2020 08/08/2020  WBC 4.0 - 10.5 K/uL 10.5 11.0(H) 12.5(H)  Hemoglobin 13.0 - 17.0 g/dL 8.1(L) 8.7(L) 8.5(L)  Hematocrit 39 - 52 % 25.4(L) 27.1(L) 26.5(L)  Platelets 150 - 400 K/uL 307 318 343   CMP Latest Ref Rng & Units 08/15/2020 08/14/2020 08/08/2020  Glucose 70 - 99 mg/dL 101(H) 97 117(H)  BUN 8 - 23 mg/dL 18 23 26(H)  Creatinine 0.61 - 1.24 mg/dL 0.60(L) 0.53(L) 0.69  Sodium 135 - 145 mmol/L 139 135 136  Potassium 3.5 - 5.1 mmol/L 3.9 3.4(L) 4.2  Chloride 98 - 111 mmol/L 101 102 98  CO2 22 - 32 mmol/L 29 28 29   Calcium 8.9 - 10.3 mg/dL 9.4 8.6(L) 8.9  Total Protein 6.5 - 8.1 g/dL 7.0 7.4 -  Total Bilirubin 0.3 - 1.2 mg/dL 0.7 0.5 -  Alkaline Phos 38 - 126 U/L 112 130(H) -  AST 15 - 41 U/L 20 21 -  ALT 0 - 44 U/L 28 31 -     Imaging studies:   CT Pelvis  (08/15/2020) personally reviewed yesterday after completion which is concerning for sacral ulceration with racking to the left with some air and fluid present, and radiologist report reviewed:  IMPRESSION: 1. 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 2. No sign of focal fluid collection in the area but with some gas tracking away from the site in the subcutaneous fat just to the LEFT of the ulceration. This could relate to debridement. Correlate with any clinical evidence of aggressive soft tissue infection. 3. Aortic atherosclerosis.   Assessment/Plan:  69 y.o. 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   - Will plan on formal I&D +/- wound vac application in the OR this afternoon with Dr Celine Ahr pending OR/anesthesia availability   -All risks, benefits, and alternatives toabove procedure(s) were discussed with the patient and multiple members of his family, all of their questions were answered to their expressed satisfaction, patient expresses they wishes to proceed, and informed consent was obtained.    - Hold tube feedings for OR  - Continue IV Abx (Vancomycin + Cefepime); will obtain Cx in OR tomorrow)   - Further management per primary service; we  will follow   All of the above findings and recommendations were discussed with the patient, patient's family, and the medical team, and all of patient's questions were answered to their expressed satisfaction.  -- Edison Simon, PA-C [facility] 08/16/2020, 8:15 AM [phone] M-F: 7am - 4pm  I saw and evaluated the patient.  I agree with the above documentation, exam, and plan, which I have edited where appropriate. Fredirick Maudlin  8:24 AM

## 2020-08-16 NOTE — TOC Benefit Eligibility Note (Signed)
Transition of Care [facility]) Benefit Eligibility Note    Patient Details  Name: Anfernee Peschke MRN: [id number] Date of Birth: 06-21-1951   Medication/Dose: Nutritional Supplements, doesn't show which products        Prescription Coverage Preferred Pharmacy: DME - Vivia Budge, [employer], [employer] with Person/Company/Phone Number:: [place], [employer], [phone]  Co-Pay: DME pays 70%  Prior Approval: No  Deductible: Met  Additional Notes: $2000 in-network deductible met, She said it looks like he has met $6500 individual Out-of-pocket but still accumulating    [place] Phone Number: 08/16/2020, 9:55 AM

## 2020-08-16 NOTE — Consult Note (Signed)
Ensign Nurse Consult Note: Reason for Consult:Sacral wound with surgical debridement today.  Due again on Friday and [facility] team will follow.  MD requests silver foam and MD says she will leave silver foam from the OR in patient room for Friday. MD does not need to be present for change on Friday.  Wound type:stage 4 pressure.  Will follow.  Domenic Moras MSN, RN, FNP-BC CWON Wound, Ostomy, Continence Nurse Pager [phone]

## 2020-08-16 NOTE — Op Note (Signed)
Operative Note  Preoperative Diagnosis: Stage IV sacral decubitus ulcer  Postoperative Diagnosis: Same  Operation: Debridement of skin, subcutaneous tissue, and muscle (48 cm); deep bone biopsy; application of wound VAC (8 x 6 x 3 cm)  Surgeon: Fredirick Maudlin, MD  Assistant: None  Anesthesia: General via tracheotomy  Findings: Frank pus emanating from the wound.  Extensive necrotic subcutaneous tissue and muscle.  Exposed sacral bone.  Indications: This is a 69 year old man who was rendered quadriplegic after a fall.  He has developed a pressure ulcer.  It appears to be frankly infected and a pelvic CT scan suggest the presence of osteomyelitis.  Debridement was indicated.  The risk the procedure were discussed with the patient and his wife.  They agreed to proceed.  Procedure In Detail: The patient was identified in the preoperative holding area.  He was brought to the operating room and anesthesia was induced via the patient's existing tracheostomy.  It was then switched out for an armored tube which was secured in place.  He was then turned prone onto the OR table.  Care was taken to appropriately support his limbs and pad all bony prominences.  His body was supported on a purpose-made chest support, and the arms were supported on the OR table arm boards.  He was positioned appropriately for the operation.  His existing dressing was removed.  There was frank pus present.  A culture was taken.  The wound was then prepped with Betadine and draped in standard fashion.  A timeout was performed confirming his identity, the procedure being performed, his allergies, all necessary equipment was available, and that maintenance anesthesia was adequate.  I began by probing the wound in its circumference.  There were multiple pockets under the skin that were broken up.  Muscle that appeared necrotic was debrided with a combination of sharp dissection and electrocautery.  There was substantial fibrinous  exudate that was debrided sharply.  The sacral bone was exposed.  Biopsies were taken with a rongeur and sent for both histology and culture.  I continued to debride all visible necrotic tissue until I had a good base of bleeding and viable-appearing tissue at the base.  This was then curetted.  The pulse lavage irrigator was used to further mechanically debride the wound bed.  Hemostasis was achieved with electrocautery.  I elected to place a wound VAC.  A square of white foam was placed over the exposed bone.  Silver granufoam was split into 2 thinner pieces.  The first was then placed at the base of the wound with a 5.5 cm area of undermining at 12:00, 2.5 cm of undermining at 9:00, and 4.5 cm of undermining at 8:00.  A second piece of foam was used to fill the remaining defect which was approximately 3 cm in depth.  Black foam was used to create a bridge to the patient's hip, to prevent further pressure injury from suction tubing.  The suction was connected and a good seal achieved.  The patient was then turned supine onto his hospital bed, his armored tube removed and replaced with his previous uncuffed tracheostomy tube.  He was aroused from anesthesia and taken to the postanesthesia care unit in good condition.  Wound ostomy care will be consulted for further management.  EBL: Less than 10 cc  IVF: See anesthesia record  Specimen(s): Sacral bone for histology and culture  Complications: none immediately apparent.   Counts: all needles, instruments, and sponges were counted and reported to be  correct in number at the end of the case.   I was present for and participated in the entire operation.  Fredirick Maudlin 10:42 AM

## 2020-08-16 NOTE — Transfer of Care (Signed)
Immediate Anesthesia Transfer of Care Note  Patient: Ryan Day  Procedure(s) Performed: INCISION AND DRAINAGE ABSCESS, SACRAL (N/A ) APPLICATION OF WOUND VAC (N/A )  Patient Location: PACU  Anesthesia Type:General  Level of Consciousness: awake, drowsy and patient cooperative  Airway & Oxygen Therapy: Patient Spontanous Breathing and Patient connected to tracheostomy mask oxygen  Post-op Assessment: Report given to RN and Post -op Vital signs reviewed and stable  Post vital signs: Reviewed and stable  Last Vitals:  Vitals Value Taken Time  BP 107/61 08/16/20 1030  Temp 37.1 C 08/16/20 1030  Pulse 95 08/16/20 1038  Resp 20 08/16/20 1038  SpO2 96 % 08/16/20 1038  Vitals shown include unvalidated device data.  Last Pain:  Vitals:   08/16/20 1030  TempSrc:   PainSc: Asleep      Patients Stated Pain Goal: 0 (39/43/20 0379)  Complications: No complications documented.

## 2020-08-16 NOTE — Addendum Note (Signed)
Addendum  created 08/16/20 1314 by Kelton Pillar, CRNA   Flowsheet accepted, Intraprocedure Flowsheets edited

## 2020-08-16 NOTE — Anesthesia Postprocedure Evaluation (Signed)
Anesthesia Post Note  Patient: Ryan Day  Procedure(s) Performed: INCISION AND DRAINAGE ABSCESS, SACRAL (N/A ) APPLICATION OF WOUND VAC (N/A )  Patient location during evaluation: PACU Anesthesia Type: General Level of consciousness: awake and alert Pain management: pain level controlled Vital Signs Assessment: post-procedure vital signs reviewed and stable Respiratory status: spontaneous breathing, nonlabored ventilation, respiratory function stable and patient connected to nasal cannula oxygen Cardiovascular status: blood pressure returned to baseline and stable Postop Assessment: no apparent nausea or vomiting Anesthetic complications: no   No complications documented.   Last Vitals:  Vitals:   08/16/20 1048 08/16/20 1059  BP: 122/68   Pulse: 97   Resp: (!) 27   Temp:  36.9 C  SpO2: 95%     Last Pain:  Vitals:   08/16/20 1059  TempSrc:   PainSc: 0-No pain                 Arita Miss

## 2020-08-16 NOTE — Progress Notes (Signed)
Pharmacy Antibiotic Note  Ryan Day is a 69 y.o. male admitted on 08/14/2020 with pneumonia.  Pharmacy has been consulted for Vancomycin and Zosyn dosing. Patient was previously on Vancomycin - last dose was yesterday at ~ 0630.  Scr 0.79 mg/dL today  Plan:  Given last dose was > 12 hrs, will order Vancomycin 2500 mg x1 dose, then resume Vancomycin 1gm IV q12hrs (per nomogram). Will monitor renal function while concomitantly taking Zosyn.  Zosyn 3.37 g Q8H - extended infusion   Height: 6\' 5"  (195.6 cm) Weight: 113.1 kg (249 lb 5.4 oz) IBW/kg (Calculated) : 89.1  Temp (24hrs), Avg:99.4 F (37.4 C), Min:98.1 F (36.7 C), Max:100.5 F (38.1 C)  Recent Labs  Lab 08/14/20 1647 08/14/20 1649 08/14/20 2224 08/15/20 0539 08/16/20 1201  WBC 11.0*  --   --  10.5 18.8*  CREATININE 0.53*  --   --  0.60* 0.79  LATICACIDVEN  --  1.2 1.5  --   --     Estimated Creatinine Clearance: 123.4 mL/min (by C-G formula based on SCr of 0.79 mg/dL).    No Known Allergies  Antimicrobials this admission:  11/22 Vancomycin> 11/23, then resumed 11/24 11/22 Cefepime >> 11/23   Microbiology results:  11/22 BCx: NGTD  11/24 WCx (anerobic/aerobic):   No organisms seen   11/23 Resp. Cx: GPR, GPC  Thank you for allowing pharmacy to be a part of this patient's care.  Rowland Lathe 08/16/2020 2:56 PM

## 2020-08-16 NOTE — Anesthesia Procedure Notes (Addendum)
Procedure Name: Intubation Performed by: Kelton Pillar, CRNA Pre-anesthesia Checklist: Patient identified, Emergency Drugs available, Suction available and Patient being monitored Patient Re-evaluated:Patient Re-evaluated prior to induction Oxygen Delivery Method: Circle system utilized Preoxygenation: Pre-oxygenation with 100% oxygen Induction Type: IV induction and Tracheostomy Tube type: Reinforced Tube size: 6.0 mm Number of attempts: 1 Placement Confirmation: positive ETCO2,  CO2 detector and breath sounds checked- equal and bilateral Tube secured with: Tape Dental Injury: Teeth and Oropharynx as per pre-operative assessment  Comments: 6.0 lubed ETT inserted into trach stoma. +etco2

## 2020-08-16 NOTE — Progress Notes (Signed)
Arrived via bed from room 145, trach suctioned x1 moderate amt of sputim

## 2020-08-16 NOTE — Progress Notes (Addendum)
PROGRESS NOTE    Ryan Day  FIE:[id number] DOB: 1951-07-22 DOA: 08/14/2020 PCP: Valerie Roys, DO    Assessment & Plan:   Active Problems:   Hyperlipidemia   Elevated alkaline phosphatase level   Spinal cord injury, cervical region, sequela (HCC)   Quadriplegia (HCC)   Neurogenic orthostatic hypotension (HCC)   Sacral decubitus ulcer, stage II (HCC)   Aspiration pneumonia (HCC)   Pneumonia   Hypokalemia   Hypoalbuminemia   Leukocytosis   Decubitus ulcer of sacral region, stage 4 (HCC)    Ryan Day is a 69 y.o. male with medical history significant for hypotension, dyslipidemia, quadriplegic s/p fall resulting in cervical spinal cord injury - August 2021 (admitted and treated at [facility] in [place] for 2 months; rehab at [facility]), trach and PEG tube dependent who presents to the emergency department via EMS accompanied by daughter due to 2-3-day onset of shortness of breath associated with increased production of clear sputum.  Patient also have a sacral bedsore which has been concerning to patient's wife due to malodorous and draining pus.    PNA, ruled out --Chest x-ray showed partial consolidation at the right base, which has been present since last hospitalization.  No fever, no leukocytosis, procal neg. --Patient was started with IV vancomycin and cefepime on presentation. PLAN: --will not treat for PNA currently  Increased airway secretion Hx of mucus plugging  --Wife reported difficulty suctioning secretion at home due to secretion being too thick.  Pt has been on scopolamine patch for a while.  RT, however, noted thin copious amount of white secretion. --scopolamine patch d/c'ed on 11/23 PLAN: --Robinul 1 mg TID PRN for increased secretion --pulm hygiene with RT --trach suctioning per RT  Sepsis 2/2 Large left sacral decubitus ulcer Left sacral osteomyelitis Malodorous purulent drainage noted on sacral decubitus ulcer by wife.  Per GenSurg, likely  undermining/tunneling.   Started on IV vancomycin and cefepime on presentation. --fever 100.5 night of 11/23, and WBC 18.8 on 11/24.  Criteria met. --CT pelvis showed left sacral osteomyelitis PLAN: --OR debridement today --OR cx obtained --resume abx as vanc and zosyn  Hypokalemia --replete PRN  PEG-dependent --wife reported insurance stopped covering for osmolite and pt needs cheaper tube feed option PLAN: --resume tube feed after surgery --TOC to look into insurance coverage of tube feed at home  Hypoalbuminemia Albumin 2.6; dietitian consulted --cont tube feed per nutrition orders  6.  Hypotension --cont home midodrine and Florinef  7.  Spinal cord injury, cervical region with subsequent quadriplegia Patient had C5 vertebral body fracture, C3-C6 cord compression with edema status post C3-C6 laminectomy with fusion and repair of the dural tear in 04/2020 at [facility]  8.  GERD Continue famotidine per home regimen  10.  Depression --cont home prozac  11.  Anemia, iron def Hemoglobin stable at 8's --anemia workup showed only iron def --cont home iron suppl   DVT prophylaxis: [id number] as DVT ppx Code Status: Full code confirmed by outpatient palliative Family Communication:  Status is: inpatient Dispo:   The patient is from: home Anticipated d/c is to: home Anticipated d/c date is: >3 days Patient currently is not medically stable to d/c due to: infected sacral decub ulcer just had surgical debridement today, on IV abx for osteomyelitis.    Subjective and Interval History:  Pt went for surgical debridement of sacral wound today.  OR cx obtained.  abx resumed afterwards.     Objective: Vitals:   08/16/20 1030 08/16/20 1048 08/16/20 1059  08/16/20 1215  BP: 107/61 122/68  99/60  Pulse: (!) 102 97  86  Resp: (!) 27 (!) 27  17  Temp: 98.7 F (37.1 C)  98.5 F (36.9 C) 98.1 F (36.7 C)  TempSrc:    Oral  SpO2: 95% 95%  99%  Weight:      Height:         Intake/Output Summary (Last 24 hours) at 08/16/2020 1526 Last data filed at 08/16/2020 1400 Gross per 24 hour  Intake 1104 ml  Output 3500 ml  Net -2396 ml   Filed Weights   08/15/20 0505  Weight: 113.1 kg    Examination:   Constitutional: NAD, appears comfortable CV: No cyanosis.   RESP: trach, 4L, no gurgling or obvious rhonchi Extremities: mild swelling in BLE SKIN: warm, dry. Wound vac present with small amount of dark serosanguinous fluid return    Data Reviewed: I have personally reviewed following labs and imaging studies  CBC: Recent Labs  Lab 08/14/20 1647 08/15/20 0539 08/16/20 1201  WBC 11.0* 10.5 18.8*  NEUTROABS 8.1*  --   --   HGB 8.7* 8.1* 8.5*  HCT 27.1* 25.4* 26.3*  MCV 88.9 88.2 87.7  PLT 318 307 616   Basic Metabolic Panel: Recent Labs  Lab 08/14/20 1647 08/15/20 0539 08/16/20 1201  NA 135 139 140  K 3.4* 3.9 3.9  CL 102 101 102  CO2 _0 GLUCOSE 97 101* 137*  BUN _1 CREATININE 0.53* 0.60* 0.79  CALCIUM 8.6* 9.4 9.4  MG  --  2.1 2.1  PHOS  --  3.5  --    GFR: Estimated Creatinine Clearance: 123.4 mL/min (by C-G formula based on SCr of 0.79 mg/dL). Liver Function Tests: Recent Labs  Lab 08/14/20 1647 08/15/20 0539  AST 21 20  ALT 31 28  ALKPHOS 130* 112  BILITOT 0.5 0.7  PROT 7.4 7.0  ALBUMIN 2.6* 2.3*   No results for input(s): LIPASE, AMYLASE in the last 168 hours. No results for input(s): AMMONIA in the last 168 hours. Coagulation Profile: Recent Labs  Lab 08/15/20 0539  INR 1.2   Cardiac Enzymes: No results for input(s): CKTOTAL, CKMB, CKMBINDEX, TROPONINI in the last 168 hours. BNP (last 3 results) No results for input(s): PROBNP in the last 8760 hours. HbA1C: No results for input(s): HGBA1C in the last 72 hours. CBG: No results for input(s): GLUCAP in the last 168 hours. Lipid Profile: No results for input(s): CHOL, HDL, LDLCALC, TRIG, CHOLHDL, LDLDIRECT in the last 72 hours. Thyroid  Function Tests: No results for input(s): TSH, T4TOTAL, FREET4, T3FREE, THYROIDAB in the last 72 hours. Anemia Panel: No results for input(s): VITAMINB12, FOLATE, FERRITIN, TIBC, IRON, RETICCTPCT in the last 72 hours. Sepsis Labs: Recent Labs  Lab 08/14/20 1649 08/14/20 2224 08/15/20 0539  PROCALCITON  --   --  <0.10  LATICACIDVEN 1.2 1.5  --     Recent Results (from the past 240 hour(s))  Resp Panel by RT-PCR (Flu A&B, Covid) Nasopharyngeal Swab     Status: None   Collection Time: 08/14/20  4:51 PM   Specimen: Nasopharyngeal Swab; Nasopharyngeal(NP) swabs in vial transport medium  Result Value Ref Range Status   SARS Coronavirus 2 by RT PCR NEGATIVE NEGATIVE Final    Comment: (NOTE) SARS-CoV-2 target nucleic acids are NOT DETECTED.  The SARS-CoV-2 RNA is generally detectable in upper respiratory specimens during the acute phase of infection. The lowest concentration of SARS-CoV-2 viral copies this assay  can detect is 138 copies/mL. A negative result does not preclude SARS-Cov-2 infection and should not be used as the sole basis for treatment or other patient management decisions. A negative result may occur with  improper specimen collection/handling, submission of specimen other than nasopharyngeal swab, presence of viral mutation(s) within the areas targeted by this assay, and inadequate number of viral copies(<138 copies/mL). A negative result must be combined with clinical observations, patient history, and epidemiological information. The expected result is Negative.  Fact Sheet for Patients:  EntrepreneurPulse.com.au  Fact Sheet for Healthcare Providers:  IncredibleEmployment.be  This test is no t yet approved or cleared by the [place] FDA and  has been authorized for detection and/or diagnosis of SARS-CoV-2 by FDA under an Emergency Use Authorization (EUA). This EUA will remain  in effect (meaning this test can be used) for  the duration of the COVID-19 declaration under Section 564(b)(1) of the Act, 21 U.S.C.section 360bbb-3(b)(1), unless the authorization is terminated  or revoked sooner.       Influenza A by PCR NEGATIVE NEGATIVE Final   Influenza B by PCR NEGATIVE NEGATIVE Final    Comment: (NOTE) The Xpert Xpress SARS-CoV-2/FLU/RSV plus assay is intended as an aid in the diagnosis of influenza from Nasopharyngeal swab specimens and should not be used as a sole basis for treatment. Nasal washings and aspirates are unacceptable for Xpert Xpress SARS-CoV-2/FLU/RSV testing.  Fact Sheet for Patients: EntrepreneurPulse.com.au  Fact Sheet for Healthcare Providers: IncredibleEmployment.be  This test is not yet approved or cleared by the [place] FDA and has been authorized for detection and/or diagnosis of SARS-CoV-2 by FDA under an Emergency Use Authorization (EUA). This EUA will remain in effect (meaning this test can be used) for the duration of the COVID-19 declaration under Section 564(b)(1) of the Act, 21 U.S.C. section 360bbb-3(b)(1), unless the authorization is terminated or revoked.  Performed at [facility], [address] [place], [place] [address]   Culture, blood (routine x 2)     Status: None (Preliminary result)   Collection Time: 08/14/20  6:44 PM   Specimen: BLOOD  Result Value Ref Range Status   Specimen Description BLOOD LEFT ANTECUBITAL  Final   Special Requests   Final    BOTTLES DRAWN AEROBIC AND ANAEROBIC Blood Culture results may not be optimal due to an excessive volume of blood received in culture bottles   Culture   Final    NO GROWTH 2 DAYS Performed at [facility], [address] [place], [place] [address]    Report Status PENDING  Incomplete  Culture, blood (routine x 2)     Status: None (Preliminary result)   Collection Time: 08/14/20  6:45 PM   Specimen: BLOOD  Result Value Ref Range Status    Specimen Description BLOOD BLOOD RIGHT FOREARM  Final   Special Requests   Final    BOTTLES DRAWN AEROBIC AND ANAEROBIC Blood Culture adequate volume   Culture   Final    NO GROWTH 2 DAYS Performed at [facility], [address], [place], [place] [address]    Report Status PENDING  Incomplete  Culture, respiratory     Status: None (Preliminary result)   Collection Time: 08/15/20  3:20 AM   Specimen: SPU  Result Value Ref Range Status   Specimen Description   Final    SPUTUM Performed at [facility], [address], [name], [place] [address]    Special Requests   Final  NONE Performed at [facility], [place], [place] [address]    Gram Stain   Final    ABUNDANT WBC PRESENT,BOTH PMN AND MONONUCLEAR FEW GRAM POSITIVE RODS RARE GRAM POSITIVE COCCI IN PAIRS    Culture   Final    CULTURE REINCUBATED FOR BETTER GROWTH Performed at [facility], [place] [address], [place], [place] [address]    Report Status PENDING  Incomplete  Aerobic/Anaerobic Culture (surgical/deep wound)     Status: None (Preliminary result)   Collection Time: 08/16/20  9:51 AM   Specimen: PATH Other; Tissue  Result Value Ref Range Status   Specimen Description TISSUE SACRAL  Final   Special Requests BONE  Final   Gram Stain   Final    RARE WBC PRESENT,BOTH PMN AND MONONUCLEAR NO ORGANISMS SEEN Performed at [facility], [place] [address], [place], [place] [address]    Culture PENDING  Incomplete   Report Status PENDING  Incomplete  Aerobic/Anaerobic Culture (surgical/deep wound)     Status: None (Preliminary result)   Collection Time: 08/16/20  9:52 AM   Specimen: PATH Other; Tissue  Result Value Ref Range Status   Specimen Description   Final    ABSCESS Performed at [facility], [address], [place], [place] [address]    Special Requests   Final    SACRAL DECUBITUS ULCER Performed at [facility], [address]  [place], [place] [address]    Gram Stain   Final    NO WBC SEEN NO ORGANISMS SEEN NO ORGANISMS SEEN Performed at [facility], [place] [address], [place], [place] [address]    Culture PENDING  Incomplete   Report Status PENDING  Incomplete      Radiology Studies: CT PELVIS W CONTRAST  Result Date: 08/15/2020 CLINICAL DATA:  Sacral decubitus ulceration with suspected abscess. EXAM: CT PELVIS WITH CONTRAST TECHNIQUE: Multidetector CT imaging of the pelvis was performed using the standard protocol following the bolus administration of intravenous contrast. CONTRAST:  175m OMNIPAQUE IOHEXOL 300 MG/ML  SOLN COMPARISON:  None FINDINGS: Urinary Tract: Foley catheter within the urinary bladder. Small amount of gas in the urinary bladder. No distal ureteral dilation. Bowel: Colonic diverticulosis. No acute gastrointestinal process to the extent evaluated. Visualized portions of the appendix are normal. Vascular/Lymphatic: Calcified atheromatous plaque in the abdominal aorta distally and tracking into the iliac vessels. Mildly enlarged LEFT external iliac lymph nodes likely reactive. Reproductive:  Prostate unremarkable by CT. Other:  No ascites. Musculoskeletal: Periosteal reaction along the sacrum at the site of a large LEFT Sacral decubitus ulcer. Exposed bone is likely evident based on the appearance on CT along the cephalad margin of the ulceration with the defect just below this level within the soft tissues. Some gas tracking in the subcutaneous fat overlying the LEFT gluteal region with packing material in this area as well. Area measures approximately 6.2 by 4.5 cm in the coronal plane. Gas tracking laterally approximately 2-3 cm away from the site of ulceration best seen on image 44 of series 2. 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 IMPRESSION: 1. 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 2. No sign of focal fluid collection in the  area but with some gas tracking away from the site in the subcutaneous fat just to the LEFT of the ulceration. This could relate to debridement. Correlate with any clinical evidence of aggressive soft tissue infection. 3. Aortic  atherosclerosis. Aortic Atherosclerosis (ICD10-I70.0). Electronically Signed   By: Zetta Bills M.D.   On: 08/15/2020 17:28   DG Chest Portable 1 View  Result Date: 08/14/2020 CLINICAL DATA:  Worsening short of breath EXAM: PORTABLE CHEST 1 VIEW COMPARISON:  08/04/2020, 08/05/2020 07/11/2020, 07/25/2020 FINDINGS: Tracheostomy tube remains in place. Surgical hardware in the cervical spine. Partial consolidation in the right base slightly increased compared with 08/05/2020. Stable cardiomediastinal silhouette. No pneumothorax. IMPRESSION: Partial consolidation at the right base, slightly increased compared to 08/05/2020, possible pneumonia. Electronically Signed   By: Donavan Foil M.D.   On: 08/14/2020 17:07     Scheduled Meds: . bisacodyl  10 mg Rectal QHS  . Chlorhexidine Gluconate Cloth  6 each Topical Daily  . famotidine  20 mg Per Tube Daily  . feeding supplement (OSMOLITE 1.5 CAL)  237 mL Per Tube Q3H  . feeding supplement (PROSource TF)  45 mL Per Tube BID  . ferrous sulfate  78 mg Oral Q breakfast  . fludrocortisone  0.2 mg Per Tube Daily  . FLUoxetine  40 mg Per Tube Daily  . free water  300 mL Per Tube Q4H  . influenza vaccine adjuvanted  0.5 mL Intramuscular Tomorrow-1000  . midodrine  10 mg Per Tube TID WC  . nutrition supplement (JUVEN)  1 packet Per Tube BID BM  . rivaroxaban  10 mg Per Tube Daily   Continuous Infusions: . piperacillin-tazobactam (ZOSYN)  IV 3.375 g (08/16/20 1220)  . vancomycin 2,500 mg (08/16/20 1400)  . vancomycin       LOS: 2 days     Enzo Bi, MD [facility] If 7PM-7AM, please contact night-coverage 08/16/2020, 3:26 PM

## 2020-08-16 NOTE — Progress Notes (Signed)
Trach in tact. Patient resting comfortably. Suctioned x2 for small amount of thick white secretions. Aerosol bottle changed during visit. Patient tolerated interventions well.

## 2020-08-17 DIAGNOSIS — J690 Pneumonitis due to inhalation of food and vomit: Secondary | ICD-10-CM | POA: Diagnosis not present

## 2020-08-17 DIAGNOSIS — L89154 Pressure ulcer of sacral region, stage 4: Secondary | ICD-10-CM | POA: Diagnosis not present

## 2020-08-17 LAB — CBC
HCT: 25.4 % — ABNORMAL LOW (ref 39.0–52.0)
Hemoglobin: 8 g/dL — ABNORMAL LOW (ref 13.0–17.0)
MCH: 28 pg (ref 26.0–34.0)
MCHC: 31.5 g/dL (ref 30.0–36.0)
MCV: 88.8 fL (ref 80.0–100.0)
Platelets: 293 10*3/uL (ref 150–400)
RBC: 2.86 MIL/uL — ABNORMAL LOW (ref 4.22–5.81)
RDW: 18.1 % — ABNORMAL HIGH (ref 11.5–15.5)
WBC: 13.8 10*3/uL — ABNORMAL HIGH (ref 4.0–10.5)
nRBC: 0 % (ref 0.0–0.2)

## 2020-08-17 LAB — BASIC METABOLIC PANEL
Anion gap: 9 (ref 5–15)
BUN: 20 mg/dL (ref 8–23)
CO2: 28 mmol/L (ref 22–32)
Calcium: 9.2 mg/dL (ref 8.9–10.3)
Chloride: 104 mmol/L (ref 98–111)
Creatinine, Ser: 0.7 mg/dL (ref 0.61–1.24)
GFR, Estimated: >60 mL/min (ref >60)
Glucose, Bld: 198 mg/dL — ABNORMAL HIGH (ref 70–99)
Potassium: 3.8 mmol/L (ref 3.5–5.1)
Sodium: 141 mmol/L (ref 135–145)

## 2020-08-17 LAB — CULTURE, RESPIRATORY W GRAM STAIN: Culture: NORMAL

## 2020-08-17 LAB — MAGNESIUM: Magnesium: 2.2 mg/dL (ref 1.7–2.4)

## 2020-08-17 NOTE — Consult Note (Signed)
[facility] SURGICAL CONSULTATION NOTE (initial) - cpt: 99254   HISTORY OF PRESENT ILLNESS (HPI):  69 y.o. male presented to [facility] ED overnight for evaluation of SOB. 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 At time of presentation, daughter had reported around a 2-3 day history of increasing SOB with an increase production in sputum. This was reportedly clear in nature. Patient does have a recent admission (11/12 - 11/16) hypoxic respiratory failure secondary to aspiration PNA and mucus plugging. Additionally, his family reported a history of sacral ulceration which has been worsening to their evaluations at home. This was evaluated on his recent admission and noted to be a stage II; however, on review of [facility] RN notes from 11/03, this was unstageable with slough present. He was given Santyl for home care. His wife is very frustrated as she feels his sacral wound was exacerbated by poor care during his lat admission. Palliative care was involved in his last admission, and it appears he, and his wife, wanted to continue full scope and palliative to follow as outpatient. Work up in the ED yesterday revealed a mild leukocytosis to 11.0k (now 10.5k), normal renal function with sCr - 0.53, mild hypokalemia to 3.4 (now 3.9), UA concerning for UTI with + leukocytes, BCx negative x2 to date, and CXR was concerning for possible PNA. He was started on IV vancomycin and cefepime and admitted to the hospitalist service.   Surgery is consulted by hospitalist physician Dr. Bernadette Hoit, DO in this context for evaluation and management of sacral ulceration.  INTERVAL HISTORY 11.25.21: He went to the operating room yesterday for debridement of his sacral decubitus ulcer.  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  Copious pus and fibrinous  exudate was debrided, along with a small amount of necrotic muscle.  A wound VAC was placed.  Wound ostomy care has been consulted for further management.  Today, his wife is at bedside.  They both state that he seems to be feeling much better; his wife feels like his face is brighter and Mr. Gruenberg says that he thinks he has improved since undergoing debridement.  PAST MEDICAL HISTORY (PMH):  Past Medical History:  Diagnosis Date  . Acute on chronic respiratory failure with hypoxia ([place])   . Acute renal injury due to hypovolemia ([place])   . Autonomic instability   . Cardiac arrest ([place])   . Cervical spinal cord injury, sequela ([place])   . Elevated alkaline phosphatase level   . 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 Hyperlipidemia   . Hypertension      PAST SURGICAL HISTORY ([place]):  Past Surgical History:  Procedure Laterality Date  . APPLICATION OF WOUND VAC N/A 08/16/2020   Procedure: APPLICATION OF WOUND VAC;  Surgeon: Fredirick Maudlin, MD;  Location: [facility] ORS;  Service: General;  Laterality: N/A;  . COLONOSCOPY WITH PROPOFOL N/A 12/05/2017   Procedure: COLONOSCOPY WITH PROPOFOL;  Surgeon: Lin Landsman, MD;  Location: [facility] ENDOSCOPY;  Service: Gastroenterology;  Laterality: N/A;  . ESOPHAGOGASTRODUODENOSCOPY  12/05/2017   Procedure: ESOPHAGOGASTRODUODENOSCOPY (EGD);  Surgeon: Lin Landsman, MD;  Location: [facility];  Service: Gastroenterology;;  . INCISION AND DRAINAGE ABSCESS N/A 08/16/2020   Procedure: INCISION AND DRAINAGE ABSCESS, SACRAL;  Surgeon: Fredirick Maudlin, MD;  Location: [facility] ORS;  Service: General;  Laterality: N/A;  MEDICATIONS:  Prior to Admission medications   Medication Sig Start Date End Date Taking? Authorizing Provider  acetaminophen (TYLENOL) 160 MG/5ML solution Place 10-20 mLs (320-640 mg total) into feeding tube every 4 (four) hours as needed for mild pain. 07/18/20   Love, Ivan Anchors, PA-C  ascorbic acid (VITAMIN C) 500 MG tablet  Place 1 tablet (500 mg total) into feeding tube 2 (two) times daily. 07/18/20   Love, Ivan Anchors, PA-C  Baclofen 5 MG TABS Place 5 mg into feeding tube 3 (three) times daily. 07/20/20   Love, Ivan Anchors, PA-C  bisacodyl (DULCOLAX) 10 MG suppository Place 1 suppository (10 mg total) rectally at bedtime. 07/20/20   Love, Ivan Anchors, PA-C  chlorhexidine (PERIDEX) 0.12 % solution 15 mLs by Mouth Rinse route 2 (two) times daily. 07/20/20   Love, Ivan Anchors, PA-C  collagenase (SANTYL) ointment Apply topically daily. 07/20/20   Love, Ivan Anchors, PA-C  famotidine (PEPCID) 20 MG tablet Place 1 tablet (20 mg total) into feeding tube daily. 07/20/20   Love, Ivan Anchors, PA-C  ferrous sulfate 300 (60 Fe) MG/5ML syrup Place 1.3 mLs (78 mg total) into feeding tube daily. 07/20/20   Love, Ivan Anchors, PA-C  fludrocortisone (FLORINEF) 0.1 MG tablet Place 2 tablets (0.2 mg total) into feeding tube daily. 07/20/20   Love, Ivan Anchors, PA-C  FLUoxetine (PROZAC) 20 MG/5ML solution Place 10 mLs (40 mg total) into feeding tube daily. 07/20/20   Love, Ivan Anchors, PA-C  guaiFENesin 200 MG tablet Place 2 tablets (400 mg total) into feeding tube every 6 (six) hours. 07/20/20   Love, Ivan Anchors, PA-C  lidocaine (LIDODERM) 5 % Place 1 patch onto the skin daily. Remove & Discard patch within 12 hours or as directed by MD 07/20/20   Love, Ivan Anchors, PA-C  midodrine (PROAMATINE) 10 MG tablet Place 1 tablet (10 mg total) into feeding tube 3 (three) times daily with meals. 07/20/20   Love, Ivan Anchors, PA-C  Mouthwashes (MOUTH RINSE) LIQD solution 15 mLs by Mouth Rinse route 2 times daily at 12 noon and 4 pm. 07/20/20   Love, Ivan Anchors, PA-C  Multiple Vitamin (MULTIVITAMIN) LIQD Place 5 mLs into feeding tube daily. 07/20/20   Love, Ivan Anchors, PA-C  nutrition supplement, JUVEN, (JUVEN) PACK Place 1 packet into feeding tube 2 (two) times daily between meals. 07/20/20   Love, Ivan Anchors, PA-C  Nutritional Supplements (FEEDING SUPPLEMENT, OSMOLITE 1.5 CAL,) LIQD Place  474 mLs into feeding tube 4 (four) times daily. 07/20/20   Love, Ivan Anchors, PA-C  Nutritional Supplements (FEEDING SUPPLEMENT, PROSOURCE TF,) liquid Place 45 mLs into feeding tube 2 (two) times daily. 07/20/20   Love, Ivan Anchors, PA-C  ondansetron (ZOFRAN) 4 MG tablet Place 1 tablet (4 mg total) into feeding tube every 8 (eight) hours as needed for nausea, vomiting or refractory nausea / vomiting. 07/20/20   Love, Ivan Anchors, PA-C  polycarbophil (FIBERCON) 625 MG tablet Place 1 tablet (625 mg total) into feeding tube daily. 07/20/20   Love, Ivan Anchors, PA-C  rivaroxaban (XARELTO) 10 MG TABS tablet Take one tablet per tube daily 08/08/20   [name], MD  saccharomyces boulardii (FLORASTOR) 250 MG capsule Place 1 capsule (250 mg total) into feeding tube 2 (two) times daily. 07/20/20   Love, Ivan Anchors, PA-C  scopolamine (TRANSDERM-SCOP) 1 MG/3DAYS Place 1 patch (1.5 mg total) onto the skin every 3 (three) days. 07/20/20   Love, Ivan Anchors, PA-C  sennosides (SENOKOT) 8.8 MG/5ML syrup Place 5 mLs  into feeding tube daily at 6 (six) AM. 07/20/20   Love, Ivan Anchors, PA-C  simethicone (MYLICON) 40 ZH/0.8MV drops Place 1.2 mLs (80 mg total) into feeding tube 4 (four) times daily. 07/20/20   Love, Ivan Anchors, PA-C  traZODone (DESYREL) 50 MG tablet Place 1 tablet (50 mg total) into feeding tube at bedtime. 07/20/20   Love, Ivan Anchors, PA-C  Water For Irrigation, Sterile (FREE WATER) SOLN Place 400 mLs into feeding tube every 4 (four) hours. Patient taking differently: Place 300 mLs into feeding tube every 4 (four) hours.  07/20/20   Love, Ivan Anchors, PA-C  zinc sulfate 220 (50 Zn) MG capsule Place 1 capsule (220 mg total) into feeding tube daily. 07/20/20   Love, Ivan Anchors, PA-C     ALLERGIES:  No Known Allergies   SOCIAL HISTORY:  Social History   Socioeconomic History  . Marital status: Married    Spouse name: Not on file  . Number of children: Not on file  . Years of education: Not on file  . Highest education  level: Not on file  Occupational History  . Not on file  Tobacco Use  . Smoking status: Never Smoker  . Smokeless tobacco: Never Used  Substance and Sexual Activity  . Alcohol use: Yes    Comment: 1 or less  . Drug use: No  . Sexual activity: Not on file  Other Topics Concern  . Not on file  Social History Narrative  . Not on file   Social Determinants of Health   Financial Resource Strain:   . Difficulty of Paying Living Expenses: Not on file  Food Insecurity:   . Worried About Charity fundraiser in the Last Year: Not on file  . Ran Out of Food in the Last Year: Not on file  Transportation Needs:   . Lack of Transportation (Medical): Not on file  . Lack of Transportation (Non-Medical): Not on file  Physical Activity:   . Days of Exercise per Week: Not on file  . Minutes of Exercise per Session: Not on file  Stress:   . Feeling of Stress : Not on file  Social Connections:   . Frequency of Communication with Friends and Family: Not on file  . Frequency of Social Gatherings with Friends and Family: Not on file  . Attends Religious Services: Not on file  . Active Member of Clubs or Organizations: Not on file  . Attends Archivist Meetings: Not on file  . Marital Status: Not on file  Intimate Partner Violence:   . Fear of Current or Ex-Partner: Not on file  . Emotionally Abused: Not on file  . Physically Abused: Not on file  . Sexually Abused: Not on file     FAMILY HISTORY:  Family History  Problem Relation Age of Onset  . Cancer Father        lung  . Cancer Brother        throat  . Heart disease Brother 52  . Liver cancer Brother   . Alcohol abuse Brother       REVIEW OF SYSTEMS:  Review of Systems  Constitutional: Negative for chills and fever.  Respiratory: Positive for sputum production.   Skin:       + Sacral wound  All other systems reviewed and are negative.   VITAL SIGNS:  Temp:  [97.2 F (36.2 C)-99.3 F (37.4 C)] 98.8 F (37.1  C) (11/25 0747) Pulse Rate:  [61-97] 82 (11/25 0747)  Resp:  [16-18] 18 (11/25 0747) BP: (99-135)/(57-71) 121/62 (11/25 0747) SpO2:  [97 %-100 %] 97 % (11/25 0747) FiO2 (%):  [28 %-31 %] 28 % (11/24 2140)     Height: 6\' 5"  (195.6 cm) Weight: 113.1 kg BMI (Calculated): 29.56   INTAKE/OUTPUT:  11/24 0701 - 11/25 0700 In: 902 [I.V.:600; IV Piggyback:302] Out: 3800 [Urine:3650; Drains:50]  PHYSICAL EXAM:  Physical Exam Vitals and nursing note reviewed.  Constitutional:      General: He is not in acute distress.    Appearance: He is well-developed. He is not ill-appearing.  HENT:     Head: Normocephalic.     Mouth/Throat:     Comments: Tracheostomy in place, on trach collar, active clear sputum production Cardiovascular:     Rate and Rhythm: Normal rate and regular rhythm.  Pulmonary:     Effort: Pulmonary effort is normal. No accessory muscle usage or respiratory distress.  Abdominal:     Comments: PEG in place, site CDI Unable to assess tenderness secondary to quadriplegia   Musculoskeletal:     Right lower leg: No edema.     Left lower leg: No edema.  Skin:    General: Skin is warm and dry.     Findings: Wound (Sacrum) present.          Comments: VAC dressing applied to recently debrided sacral ulcer.  Minimal drainage in cannister.  Neurological:     General: No focal deficit present.     Mental Status: He is alert.     Comments: At baseline    Labs:  CBC Latest Ref Rng & Units 08/17/2020 08/16/2020 08/15/2020  WBC 4.0 - 10.5 K/uL 13.8(H) 18.8(H) 10.5  Hemoglobin 13.0 - 17.0 g/dL 8.0(L) 8.5(L) 8.1(L)  Hematocrit 39 - 52 % 25.4(L) 26.3(L) 25.4(L)  Platelets 150 - 400 K/uL 293 292 307   CMP Latest Ref Rng & Units 08/17/2020 08/16/2020 08/15/2020  Glucose 70 - 99 mg/dL 198(H) 137(H) 101(H)  BUN 8 - 23 mg/dL 20 18 18   Creatinine 0.61 - 1.24 mg/dL 0.70 0.79 0.60(L)  Sodium 135 - 145 mmol/L 141 140 139  Potassium 3.5 - 5.1 mmol/L 3.8 3.9 3.9  Chloride 98 - 111 mmol/L  104 102 101  CO2 22 - 32 mmol/L 28 29 29   Calcium 8.9 - 10.3 mg/dL 9.2 9.4 9.4  Total Protein 6.5 - 8.1 g/dL - - 7.0  Total Bilirubin 0.3 - 1.2 mg/dL - - 0.7  Alkaline Phos 38 - 126 U/L - - 112  AST 15 - 41 U/L - - 20  ALT 0 - 44 U/L - - 28     Imaging studies:  No pertinent imaging studies at this time   Assessment/Plan: (ICD-10's: L89.159) 69 y.o. 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  Would consider involving infectious disease as I anticipate he will require long-term IV antibiotics.  - WOC to assume dressing changes.  Detailed wound description in operative note. Silver granufoam left at bedside for their use.   - Continue q2 hr repositioning, and low air loss mattress   -Continue tube feedings  - DVT prophylaxis  - Further management per primary service  All of the above findings and recommendations were discussed with the patient, the patient's wife, and Dr. Doristine Bosworth and all of their questions were answered to their expressed satisfaction.  Thank  you for the opportunity to participate in this patient's care.

## 2020-08-17 NOTE — [place] Notes (Signed)
PROGRESS NOTE    Ryan Day  MIW:[id number] DOB: [birth year] DOA: 08/14/2020 PCP: Valerie Roys, DO   Brief hospital summary:  Ryan Day is a 69 y.o. 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  Patient also have a sacral bedsore which has been concerning to patient's wife due to malodorous and draining pus.    Assessment & Plan:   Active Problems:   Hyperlipidemia   Elevated alkaline phosphatase level   Spinal cord injury, cervical region, sequela (HCC)   Quadriplegia (HCC)   Neurogenic orthostatic hypotension (HCC)   Sacral decubitus ulcer, stage II (HCC)   Aspiration pneumonia (HCC)   Pneumonia   Hypokalemia   Hypoalbuminemia   Leukocytosis   Decubitus ulcer of sacral region, stage 4 (HCC)      PNA, ruled out --Chest x-ray showed partial consolidation at the right base, which has been present since last hospitalization.  No fever, no leukocytosis, procal neg. No indication of antibiotics.  Increased airway secretion Hx of mucus plugging  --Wife reported difficulty suctioning secretion at home due to secretion being too thick.  Pt has been on scopolamine patch for a while.  RT, however, noted thin copious amount of white secretion. --scopolamine patch d/c'ed on 11/23 PLAN: --Robinul 1 mg TID PRN for increased secretion --pulm hygiene with RT --trach suctioning per RT  Sepsis 2/2 Large left sacral decubitus ulcer Left sacral osteomyelitis Malodorous purulent drainage noted on sacral decubitus ulcer by wife.  S/p OR large debridement by general surgery on 08/16/2020.  Now has wound VAC.  Patient  remains on IV vancomycin and cefepime.  All the blood cultures are negative so far.  Wound culture was obtained.  We will wait for that and then likely will need ID consultation for further recommendations.  Further management per general surgery.  Hypokalemia Resolved  PEG-dependent --wife reported insurance stopped covering for osmolite and pt needs cheaper tube feed option PLAN: --resume tube feed after surgery --TOC to look into insurance coverage of tube feed at home  6.  Hypotension -Blood pressure stable-cont home midodrine and Florinef  7.  Spinal cord injury, cervical region with subsequent quadriplegia Patient had C5 vertebral body fracture, C3-C6 cord compression with edema status post C3-C6 laminectomy with fusion and repair of the dural tear in 04/2020 at [facility]  8.  GERD Continue famotidine per home regimen  10.  Depression --cont home prozac  11.  Anemia, iron def Hemoglobin stable at 8's --anemia workup showed only iron def --cont home iron suppl   DVT prophylaxis: GN:OIBBCWU as DVT ppx Code Status: Full code confirmed by outpatient palliative Family Communication:  Status is: inpatient Dispo:   The patient is from: home Anticipated d/c is to: home Anticipated d/c date is: >3 days Patient currently is not medically stable to d/c due to: infected sacral decub ulcer just had surgical debridement today, on IV abx for osteomyelitis.    Subjective and Interval History:  Patient seen and examined while he was being seen by general surgeon.  Patient's wife at the bedside.  Patient denies any complaint.  According to wife, he is feeling much better.   Objective: Vitals:  08/17/20 0330 08/17/20 0500 08/17/20 0747 08/17/20 1142  BP: 124/60  121/62 (!) 115/58  Pulse: 80  82 78  Resp: 16  18 19   Temp: (!) 97.2 F (36.2 C) 98.3 F (36.8 C) 98.8 F (37.1 C) 99.1 F (37.3 C)  TempSrc:  Axillary Oral   SpO2: 100%  97% 100%  Weight:      Height:         Intake/Output Summary (Last 24 hours) at 08/17/2020 1236 Last data filed at 08/17/2020 0447 Gross per 24 hour  Intake 302.03 ml  Output 3050 ml  Net -2747.97 ml   Filed Weights   08/15/20 0505  Weight: 113.1 kg    Examination:   General exam: Appears calm and comfortable  Respiratory system: Clear to auscultation. Respiratory effort normal. Cardiovascular system: S1 & S2 heard, RRR. No JVD, murmurs, rubs, gallops or clicks. No pedal edema. Gastrointestinal system: Abdomen is nondistended, soft and nontender. No organomegaly or masses felt. Normal bowel sounds heard. Central nervous system: Alert and oriented.  Quadriplegic with indwelling Foley catheter. Skin: Sacral ulcer with wound VAC attached. Psychiatry: Judgement and insight appear normal. Mood & affect appropriate.   Data Reviewed: I have personally reviewed following labs and imaging studies  CBC: Recent Labs  Lab 08/14/20 1647 08/15/20 0539 08/16/20 1201 08/17/20 0634  WBC 11.0* 10.5 18.8* 13.8*  NEUTROABS 8.1*  --   --   --   HGB 8.7* 8.1* 8.5* 8.0*  HCT 27.1* 25.4* 26.3* 25.4*  MCV 88.9 88.2 87.7 88.8  PLT 318 307 292 412   Basic Metabolic Panel: Recent Labs  Lab 08/14/20 1647 08/15/20 0539 08/16/20 1201 08/17/20 0634  NA 135 139 140 141  K 3.4* 3.9 3.9 3.8  CL 102 101 102 104  CO2 28 29 29 28   GLUCOSE 97 101* 137* 198*  BUN 23 18 18 20   CREATININE 0.53* 0.60* 0.79 0.70  CALCIUM 8.6* 9.4 9.4 9.2  MG  --  2.1 2.1 2.2  PHOS  --  3.5  --   --    GFR: Estimated Creatinine Clearance: 123.4 mL/min (by C-G formula based on SCr of 0.7 mg/dL). Liver Function Tests: Recent Labs  Lab 08/14/20 1647 08/15/20 0539  AST 21 20  ALT 31 28  ALKPHOS 130* 112  BILITOT 0.5 0.7  PROT 7.4 7.0  ALBUMIN 2.6* 2.3*   No results for input(s): LIPASE, AMYLASE in the last 168 hours. No results for input(s): AMMONIA in the last 168 hours. Coagulation Profile: Recent Labs  Lab 08/15/20 0539  INR 1.2    Cardiac Enzymes: No results for input(s): CKTOTAL, CKMB, CKMBINDEX, TROPONINI in the last 168 hours. BNP (last 3 results) No results for input(s): PROBNP in the last 8760 hours. HbA1C: No results for input(s): HGBA1C in the last 72 hours. CBG: No results for input(s): GLUCAP in the last 168 hours. Lipid Profile: No results for input(s): CHOL, HDL, LDLCALC, TRIG, CHOLHDL, LDLDIRECT in the last 72 hours. Thyroid Function Tests: No results for input(s): TSH, T4TOTAL, FREET4, T3FREE, THYROIDAB in the last 72 hours. Anemia Panel: No results for input(s): VITAMINB12, FOLATE, FERRITIN, TIBC, IRON, RETICCTPCT in the last 72 hours. Sepsis Labs: Recent Labs  Lab 08/14/20 1649 08/14/20 2224 08/15/20 0539  PROCALCITON  --   --  <0.10  LATICACIDVEN 1.2 1.5  --     Recent Results (from the past 240 hour(s))  Resp Panel by RT-PCR (Flu A&B, Covid) Nasopharyngeal Swab     Status: None  Collection Time: 08/14/20  4:51 PM   Specimen: Nasopharyngeal Swab; Nasopharyngeal(NP) swabs in vial transport medium  Result Value Ref Range Status   SARS Coronavirus 2 by RT PCR NEGATIVE NEGATIVE Final    Comment: (NOTE) SARS-CoV-2 target nucleic acids are NOT DETECTED.  The SARS-CoV-2 RNA is generally detectable in upper respiratory specimens during the acute phase of infection. The lowest concentration of SARS-CoV-2 viral copies this assay can detect is 138 copies/mL. A negative result does not preclude SARS-Cov-2 infection and should not be used as the sole basis for treatment or other patient management decisions. A negative result may occur with  improper specimen collection/handling, submission of specimen other than nasopharyngeal swab, presence of viral mutation(s) within the areas targeted by this assay, and inadequate number of viral copies(<138 copies/mL). A negative result must be combined with clinical observations, patient history, and epidemiological information. The expected result is  Negative.  Fact Sheet for Patients:  EntrepreneurPulse.com.au  Fact Sheet for Healthcare Providers:  IncredibleEmployment.be  This test is no t yet approved or cleared by the [place] FDA and  has been authorized for detection and/or diagnosis of SARS-CoV-2 by FDA under an Emergency Use Authorization (EUA). This EUA will remain  in effect (meaning this test can be used) for the duration of the COVID-19 declaration under Section 564(b)(1) of the Act, 21 U.S.C.section 360bbb-3(b)(1), unless the authorization is terminated  or revoked sooner.       Influenza A by PCR NEGATIVE NEGATIVE Final   Influenza B by PCR NEGATIVE NEGATIVE Final    Comment: (NOTE) The Xpert Xpress SARS-CoV-2/FLU/RSV plus assay is intended as an aid in the diagnosis of influenza from Nasopharyngeal swab specimens and should not be used as a sole basis for treatment. Nasal washings and aspirates are unacceptable for Xpert Xpress SARS-CoV-2/FLU/RSV testing.  Fact Sheet for Patients: EntrepreneurPulse.com.au  Fact Sheet for Healthcare Providers: IncredibleEmployment.be  This test is not yet approved or cleared by the [place] FDA and has been authorized for detection and/or diagnosis of SARS-CoV-2 by FDA under an Emergency Use Authorization (EUA). This EUA will remain in effect (meaning this test can be used) for the duration of the COVID-19 declaration under Section 564(b)(1) of the Act, 21 U.S.C. section 360bbb-3(b)(1), unless the authorization is terminated or revoked.  Performed at [facility], [address] [place], [place] [address]   Culture, blood (routine x 2)     Status: None (Preliminary result)   Collection Time: 08/14/20  6:44 PM   Specimen: BLOOD  Result Value Ref Range Status   Specimen Description BLOOD LEFT ANTECUBITAL  Final   Special Requests   Final    BOTTLES DRAWN AEROBIC AND ANAEROBIC  Blood Culture results may not be optimal due to an excessive volume of blood received in culture bottles   Culture   Final    NO GROWTH 3 DAYS Performed at [facility], [address], [place], [place] [address]    Report Status PENDING  Incomplete  Culture, blood (routine x 2)     Status: None (Preliminary result)   Collection Time: 08/14/20  6:45 PM   Specimen: BLOOD  Result Value Ref Range Status   Specimen Description BLOOD BLOOD RIGHT FOREARM  Final   Special Requests   Final    BOTTLES DRAWN AEROBIC AND ANAEROBIC Blood Culture adequate volume   Culture   Final    NO GROWTH 3 DAYS Performed at [facility], [address] [place], [place]  [address]    Report Status PENDING  Incomplete  Culture, respiratory     Status: None (Preliminary result)   Collection Time: 08/15/20  3:20 AM   Specimen: SPU  Result Value Ref Range Status   Specimen Description   Final    SPUTUM Performed at [facility], [address], [place], [place] [address]    Special Requests   Final    NONE Performed at [facility], [address] [place], [place] [address]    Gram Stain   Final    ABUNDANT WBC PRESENT,BOTH PMN AND MONONUCLEAR FEW GRAM POSITIVE RODS RARE GRAM POSITIVE COCCI IN PAIRS    Culture   Final    CULTURE REINCUBATED FOR BETTER GROWTH Performed at [facility], [place] [address], [place], [place] [address]    Report Status PENDING  Incomplete  Aerobic/Anaerobic Culture (surgical/deep wound)     Status: None (Preliminary result)   Collection Time: 08/16/20  9:51 AM   Specimen: PATH Other; Tissue  Result Value Ref Range Status   Specimen Description TISSUE SACRAL  Final   Special Requests BONE  Final   Gram Stain   Final    RARE WBC PRESENT,BOTH PMN AND MONONUCLEAR NO ORGANISMS SEEN    Culture   Final    RARE GRAM NEGATIVE RODS CULTURE REINCUBATED FOR BETTER GROWTH SUSCEPTIBILITIES TO FOLLOW Performed at [facility], [place] [address] [place], [place] [address]    Report Status PENDING  Incomplete  Aerobic/Anaerobic Culture (surgical/deep wound)     Status: None (Preliminary result)   Collection Time: 08/16/20  9:52 AM   Specimen: PATH Other; Tissue  Result Value Ref Range Status   Specimen Description   Final    ABSCESS Performed at [facility], [address], [place], [place] [address]    Special Requests   Final    SACRAL DECUBITUS ULCER Performed at [facility], [address] [place], [place] [address]    Gram Stain NO WBC SEEN NO ORGANISMS SEEN NO ORGANISMS SEEN   Final   Culture   Final    CULTURE REINCUBATED FOR BETTER GROWTH Performed at [facility], [place] [address], [place], [place] [address]    Report Status PENDING  Incomplete      Radiology Studies: CT PELVIS W CONTRAST  Result Date: 08/15/2020 CLINICAL DATA:  Sacral decubitus ulceration with suspected abscess. EXAM: CT PELVIS WITH CONTRAST TECHNIQUE: Multidetector CT imaging of the pelvis was performed using the standard protocol following the bolus administration of intravenous contrast. CONTRAST:  133mL OMNIPAQUE IOHEXOL 300 MG/ML  SOLN COMPARISON:  None FINDINGS: Urinary Tract: Foley catheter within the urinary bladder. Small amount of gas in the urinary bladder. No distal ureteral dilation. Bowel: Colonic diverticulosis. No acute gastrointestinal process to the extent evaluated. Visualized portions of the appendix are normal. Vascular/Lymphatic: Calcified atheromatous plaque in the abdominal aorta distally and tracking into the iliac vessels. Mildly enlarged LEFT external iliac lymph nodes likely reactive. Reproductive:  Prostate unremarkable by CT. Other:  No ascites. Musculoskeletal: Periosteal reaction along the sacrum at the site of a large LEFT Sacral decubitus ulcer. Exposed bone is likely evident based on the appearance on CT along the cephalad margin of the ulceration with the defect just below  this level within the soft tissues. Some gas tracking in the subcutaneous fat overlying the LEFT gluteal region with packing material in this area as well. Area measures approximately 6.2 by 4.5 cm  in the coronal plane. Gas tracking laterally approximately 2-3 cm away from the site of ulceration best seen on image 44 of series 2. 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 IMPRESSION: 1. 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 2. No sign of focal fluid collection in the area but with some gas tracking away from the site in the subcutaneous fat just to the LEFT of the ulceration. This could relate to debridement. Correlate with any clinical evidence of aggressive soft tissue infection. 3. Aortic atherosclerosis. Aortic Atherosclerosis (ICD10-I70.0). Electronically Signed   By: Zetta Bills M.D.   On: 08/15/2020 17:28     Scheduled Meds: . bisacodyl  10 mg Rectal QHS  . Chlorhexidine Gluconate Cloth  6 each Topical Daily  . famotidine  20 mg Per Tube Daily  . feeding supplement (OSMOLITE 1.5 CAL)  237 mL Per Tube Q3H  . feeding supplement (PROSource TF)  45 mL Per Tube BID  . ferrous sulfate  78 mg Oral Q breakfast  . fludrocortisone  0.2 mg Per Tube Daily  . FLUoxetine  40 mg Per Tube Daily  . free water  300 mL Per Tube Q4H  . influenza vaccine adjuvanted  0.5 mL Intramuscular Tomorrow-1000  . midodrine  10 mg Per Tube TID WC  . nutrition supplement (JUVEN)  1 packet Per Tube BID BM  . rivaroxaban  10 mg Per Tube Daily   Continuous Infusions: . piperacillin-tazobactam (ZOSYN)  IV 3.375 g (08/17/20 1227)  . vancomycin 1,000 mg (08/17/20 0145)     LOS: 3 days   Darliss Cheney, MD [facility] If 7PM-7AM, please contact night-coverage 08/17/2020, 12:36 PM

## 2020-08-18 ENCOUNTER — Inpatient Hospital Stay: Payer: BC Managed Care – PPO

## 2020-08-18 DIAGNOSIS — M4628 Osteomyelitis of vertebra, sacral and sacrococcygeal region: Secondary | ICD-10-CM

## 2020-08-18 DIAGNOSIS — L89154 Pressure ulcer of sacral region, stage 4: Secondary | ICD-10-CM

## 2020-08-18 DIAGNOSIS — J690 Pneumonitis due to inhalation of food and vomit: Secondary | ICD-10-CM

## 2020-08-18 DIAGNOSIS — L89152 Pressure ulcer of sacral region, stage 2: Secondary | ICD-10-CM | POA: Diagnosis not present

## 2020-08-18 LAB — CBC
HCT: 25.7 % — ABNORMAL LOW (ref 39.0–52.0)
Hemoglobin: 8.4 g/dL — ABNORMAL LOW (ref 13.0–17.0)
MCH: 28.7 pg (ref 26.0–34.0)
MCHC: 32.7 g/dL (ref 30.0–36.0)
MCV: 87.7 fL (ref 80.0–100.0)
Platelets: 294 10*3/uL (ref 150–400)
RBC: 2.93 MIL/uL — ABNORMAL LOW (ref 4.22–5.81)
RDW: 18.3 % — ABNORMAL HIGH (ref 11.5–15.5)
WBC: 10 10*3/uL (ref 4.0–10.5)
nRBC: 0 % (ref 0.0–0.2)

## 2020-08-18 LAB — BASIC METABOLIC PANEL
Anion gap: 8 (ref 5–15)
BUN: 24 mg/dL — ABNORMAL HIGH (ref 8–23)
CO2: 32 mmol/L (ref 22–32)
Calcium: 8.9 mg/dL (ref 8.9–10.3)
Chloride: 104 mmol/L (ref 98–111)
Creatinine, Ser: 0.55 mg/dL — ABNORMAL LOW (ref 0.61–1.24)
GFR, Estimated: >60 mL/min (ref >60)
Glucose, Bld: 134 mg/dL — ABNORMAL HIGH (ref 70–99)
Potassium: 3.5 mmol/L (ref 3.5–5.1)
Sodium: 144 mmol/L (ref 135–145)

## 2020-08-18 LAB — VANCOMYCIN, TROUGH: Vancomycin Tr: 15 ug/mL (ref 15–20)

## 2020-08-18 LAB — SURGICAL PATHOLOGY

## 2020-08-18 LAB — MAGNESIUM: Magnesium: 2.1 mg/dL (ref 1.7–2.4)

## 2020-08-18 IMAGING — DX DG CHEST 1V PORT
1 series · 1 of 1 positions shown · non-contrast
Comparison: Chest radiograph [DATE].

CLINICAL DATA: Ronchi at both lung bases.

EXAM:
PORTABLE CHEST 1 VIEW

[chest ap]
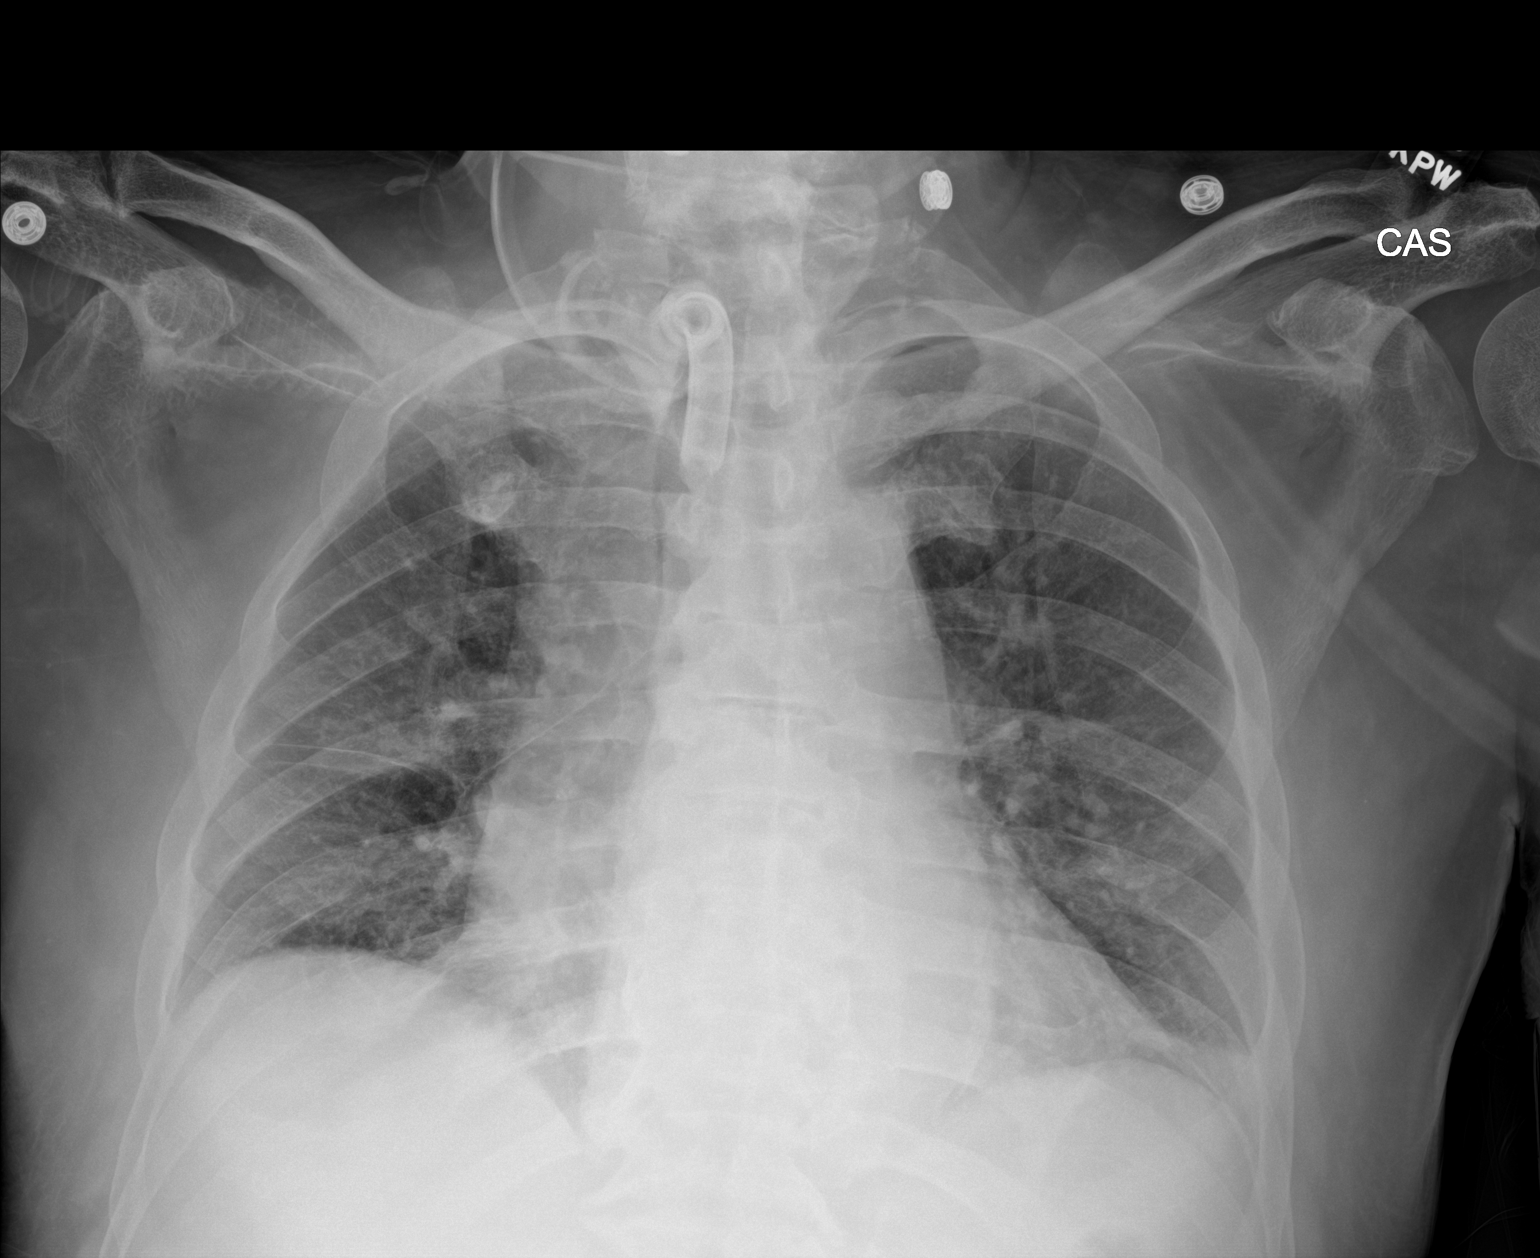

[1 of 1 positions shown; findings below may reference images not displayed]

FINDINGS: Unchanged position of a tracheostomy tube which terminates at the
level of the clavicular heads.

Heart size within normal limits. Shallow inspiration radiograph.
Significant interval improvement in aeration of the right lung base
with only subtle persistent opacity within the medial right lower
lobe. Unchanged minimal opacity within the lateral left lung base
with an appearance most suggestive of atelectasis. No sizable
pleural effusion or evidence of pneumothorax. No acute bony
abnormality identified. Fusion hardware within the visualized
cervical spine.
IMPRESSION: Shallow inspiration radiograph.

Significant interval improvement in aeration of the right lung base
with only subtle persistent opacity in the medial right lower lobe.
Findings may reflect improving atelectasis and/or pneumonia.

Unchanged subtle opacity within the lateral left lung base with an
appearance most suggestive of atelectasis.

## 2020-08-18 NOTE — Progress Notes (Signed)
PROGRESS NOTE    Ryan Day  AUQ:[id number] DOB: [birth year] DOA: 08/14/2020 PCP: Valerie Roys, DO   Brief hospital summary:  Ryan Day is a 69 y.o. 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  Patient also have a sacral bedsore which has been concerning to patient's wife due to malodorous and draining pus.    Assessment & Plan:   Active Problems:   Hyperlipidemia   Elevated alkaline phosphatase level   Spinal cord injury, cervical region, sequela (HCC)   Quadriplegia (HCC)   Neurogenic orthostatic hypotension (HCC)   Sacral decubitus ulcer, stage II (HCC)   Aspiration pneumonia (HCC)   Pneumonia   Hypokalemia   Hypoalbuminemia   Leukocytosis   Decubitus ulcer of sacral region, stage 4 (HCC)      PNA, ruled out --Chest x-ray showed partial consolidation at the right base, which has been present since last hospitalization.  But also suggested possible pneumonia.  No fever but some leukocytosis and has rhonchi on examination today.  We will repeat chest x-ray today but patient is already on broad-spectrum IV antibiotics.  Increased airway secretion Hx of mucus plugging  --Wife reported difficulty suctioning secretion at home due to secretion being too thick.  Pt has been on scopolamine patch for a while.  RT, however, noted thin copious amount of white secretion. --scopolamine patch d/c'ed on 11/23 PLAN: --Robinul 1 mg TID PRN for increased secretion --pulm hygiene with RT --trach suctioning per RT  Sepsis 2/2 Large left sacral decubitus ulcer Left sacral osteomyelitis Malodorous purulent drainage  noted on sacral decubitus ulcer by wife.  S/p OR large debridement by general surgery on 08/16/2020.  Now has wound VAC.  Patient remains on IV vancomycin and cefepime.  All the blood cultures are negative so far.  Wound culture was obtained and the results of that are pending.  I have consulted ID today.  Will defer further management to general surgery and ID.   management per general surgery.  Hypokalemia Resolved  PEG-dependent --wife reported insurance stopped covering for osmolite and pt needs cheaper tube feed option PLAN: --resume tube feed after surgery --TOC to look into insurance coverage of tube feed at home  6.  Hypotension -Blood pressure stable-cont home midodrine and Florinef  7.  Spinal cord injury, cervical region with subsequent quadriplegia Patient had C5 vertebral body fracture, C3-C6 cord compression with edema status post C3-C6 laminectomy with fusion and repair of the dural tear in 04/2020 at [facility]  8.  GERD Continue famotidine per home regimen  10.  Depression --cont home prozac  11.  Anemia, iron def Hemoglobin stable at 8's --anemia workup showed only iron def --cont home iron suppl   DVT prophylaxis: WL:SLHTDSK as DVT ppx Code Status: Full code confirmed by outpatient palliative Family Communication:  Status is: inpatient Dispo:   The patient is from: home Anticipated d/c is to: home Anticipated d/c date is: >3 days Patient currently is not medically stable to d/c due to: infected sacral decub ulcer just had surgical debridement today, on IV abx for osteomyelitis.    Subjective and Interval History:  Seen and examined.  Wife at the bedside.  Patient states that he is feeling fine.  No complaints.  Denied any shortness of breath.   Objective: Vitals:   08/17/20 2138 08/18/20 0006 08/18/20 0150 08/18/20 0425  BP:  125/67  126/65  Pulse:  79  70  Resp:    19  Temp:  98.5 F (36.9 C)  97.7 F (36.5 C)  TempSrc:      SpO2: 99% 98% 99%  99%  Weight:      Height:        Intake/Output Summary (Last 24 hours) at 08/18/2020 1024 Last data filed at 08/18/2020 0700 Gross per 24 hour  Intake 1011 ml  Output 3875 ml  Net -2864 ml   Filed Weights   08/15/20 0505  Weight: 113.1 kg    Examination:   General exam: Appears calm and comfortable with tracheostomy and PEG tube Respiratory system: Rhonchi bilaterally, more on the right than the left. Respiratory effort normal. Cardiovascular system: S1 & S2 heard, RRR. No JVD, murmurs, rubs, gallops or clicks. No pedal edema. Gastrointestinal system: Abdomen is nondistended, soft and nontender. No organomegaly or masses felt. Normal bowel sounds heard.  PEG tube in place Central nervous system: Alert and oriented.  Quadriplegic Extremities: Symmetric 5 x 5 power. Skin: Sacral ulcer Psychiatry: Judgement and insight appear normal. Mood & affect appropriate.   Data Reviewed: I have personally reviewed following labs and imaging studies  CBC: Recent Labs  Lab 08/14/20 1647 08/15/20 0539 08/16/20 1201 08/17/20 0634 08/18/20 0357  WBC 11.0* 10.5 18.8* 13.8* 10.0  NEUTROABS 8.1*  --   --   --   --   HGB 8.7* 8.1* 8.5* 8.0* 8.4*  HCT 27.1* 25.4* 26.3* 25.4* 25.7*  MCV 88.9 88.2 87.7 88.8 87.7  PLT 318 307 292 293 803   Basic Metabolic Panel: Recent Labs  Lab 08/14/20 1647 08/15/20 0539 08/16/20 1201 08/17/20 0634 08/18/20 0357  NA 135 139 140 141 144  K 3.4* 3.9 3.9 3.8 3.5  CL 102 101 102 104 104  CO2 28 29 29 28  32  GLUCOSE 97 101* 137* 198* 134*  BUN 23 18 18 20  24*  CREATININE 0.53* 0.60* 0.79 0.70 0.55*  CALCIUM 8.6* 9.4 9.4 9.2 8.9  MG  --  2.1 2.1 2.2 2.1  PHOS  --  3.5  --   --   --    GFR: Estimated Creatinine Clearance: 123.4 mL/min (A) (by C-G formula based on SCr of 0.55 mg/dL (L)). Liver Function Tests: Recent Labs  Lab 08/14/20 1647 08/15/20 0539  AST 21 20  ALT 31 28  ALKPHOS 130* 112  BILITOT 0.5 0.7  PROT 7.4 7.0  ALBUMIN 2.6*  2.3*   No results for input(s): LIPASE, AMYLASE in the last 168 hours. No results for input(s): AMMONIA in the last 168 hours. Coagulation Profile: Recent Labs  Lab 08/15/20 0539  INR 1.2   Cardiac Enzymes: No results for input(s): CKTOTAL, CKMB, CKMBINDEX, TROPONINI in the last 168 hours. BNP (last 3 results) No results for input(s): PROBNP in the last 8760 hours. HbA1C: No results for input(s): HGBA1C in the last 72 hours. CBG: No results for input(s): GLUCAP in the last 168 hours. Lipid Profile: No results for input(s): CHOL, HDL, LDLCALC, TRIG, CHOLHDL, LDLDIRECT in the last 72 hours. Thyroid Function Tests: No results for input(s): TSH, T4TOTAL, FREET4, T3FREE, THYROIDAB in the last 72 hours. Anemia Panel: No results for input(s): VITAMINB12, FOLATE, FERRITIN, TIBC, IRON, RETICCTPCT in the last 72 hours. Sepsis Labs:  Recent Labs  Lab 08/14/20 1649 08/14/20 2224 08/15/20 0539  PROCALCITON  --   --  <0.10  LATICACIDVEN 1.2 1.5  --     Recent Results (from the past 240 hour(s))  Resp Panel by RT-PCR (Flu A&B, Covid) Nasopharyngeal Swab     Status: None   Collection Time: 08/14/20  4:51 PM   Specimen: Nasopharyngeal Swab; Nasopharyngeal(NP) swabs in vial transport medium  Result Value Ref Range Status   SARS Coronavirus 2 by RT PCR NEGATIVE NEGATIVE Final    Comment: (NOTE) SARS-CoV-2 target nucleic acids are NOT DETECTED.  The SARS-CoV-2 RNA is generally detectable in upper respiratory specimens during the acute phase of infection. The lowest concentration of SARS-CoV-2 viral copies this assay can detect is 138 copies/mL. A negative result does not preclude SARS-Cov-2 infection and should not be used as the sole basis for treatment or other patient management decisions. A negative result may occur with  improper specimen collection/handling, submission of specimen other than nasopharyngeal swab, presence of viral mutation(s) within the areas targeted by this  assay, and inadequate number of viral copies(<138 copies/mL). A negative result must be combined with clinical observations, patient history, and epidemiological information. The expected result is Negative.  Fact Sheet for Patients:  EntrepreneurPulse.com.au  Fact Sheet for Healthcare Providers:  IncredibleEmployment.be  This test is no t yet approved or cleared by the [place] FDA and  has been authorized for detection and/or diagnosis of SARS-CoV-2 by FDA under an Emergency Use Authorization (EUA). This EUA will remain  in effect (meaning this test can be used) for the duration of the COVID-19 declaration under Section 564(b)(1) of the Act, 21 U.S.C.section 360bbb-3(b)(1), unless the authorization is terminated  or revoked sooner.       Influenza A by PCR NEGATIVE NEGATIVE Final   Influenza B by PCR NEGATIVE NEGATIVE Final    Comment: (NOTE) The Xpert Xpress SARS-CoV-2/FLU/RSV plus assay is intended as an aid in the diagnosis of influenza from Nasopharyngeal swab specimens and should not be used as a sole basis for treatment. Nasal washings and aspirates are unacceptable for Xpert Xpress SARS-CoV-2/FLU/RSV testing.  Fact Sheet for Patients: EntrepreneurPulse.com.au  Fact Sheet for Healthcare Providers: IncredibleEmployment.be  This test is not yet approved or cleared by the [place] FDA and has been authorized for detection and/or diagnosis of SARS-CoV-2 by FDA under an Emergency Use Authorization (EUA). This EUA will remain in effect (meaning this test can be used) for the duration of the COVID-19 declaration under Section 564(b)(1) of the Act, 21 U.S.C. section 360bbb-3(b)(1), unless the authorization is terminated or revoked.  Performed at [facility], [address] [place], [place] [address]   Culture, blood (routine x 2)     Status: None (Preliminary result)    Collection Time: 08/14/20  6:44 PM   Specimen: BLOOD  Result Value Ref Range Status   Specimen Description BLOOD LEFT ANTECUBITAL  Final   Special Requests   Final    BOTTLES DRAWN AEROBIC AND ANAEROBIC Blood Culture results may not be optimal due to an excessive volume of blood received in culture bottles   Culture   Final    NO GROWTH 4 DAYS Performed at [facility], [address] [place], [place] [address]    Report Status PENDING  Incomplete  Culture, blood (routine x 2)     Status: None (Preliminary result)   Collection Time: 08/14/20  6:45 PM   Specimen: BLOOD  Result Value Ref Range  Status   Specimen Description BLOOD BLOOD RIGHT FOREARM  Final   Special Requests   Final    BOTTLES DRAWN AEROBIC AND ANAEROBIC Blood Culture adequate volume   Culture   Final    NO GROWTH 4 DAYS Performed at [facility], [address] [place], [place] [address]    Report Status PENDING  Incomplete  Culture, respiratory     Status: None   Collection Time: 08/15/20  3:20 AM   Specimen: SPU  Result Value Ref Range Status   Specimen Description   Final    SPUTUM Performed at [facility], [address] [place], [place] [address]    Special Requests   Final    NONE Performed at [facility], [address] [place], [place] [address]    Gram Stain   Final    ABUNDANT WBC PRESENT,BOTH PMN AND MONONUCLEAR FEW GRAM POSITIVE RODS RARE GRAM POSITIVE COCCI IN PAIRS    Culture   Final    RARE Normal respiratory flora-no Staph aureus or Pseudomonas seen Performed at [facility], [place] [address], [place], [place] [address]    Report Status 08/17/2020 FINAL  Final  Aerobic/Anaerobic Culture (surgical/deep wound)     Status: None (Preliminary result)   Collection Time: 08/16/20  9:51 AM   Specimen: PATH Other; Tissue  Result Value Ref Range Status   Specimen Description TISSUE SACRAL  Final   Special Requests BONE  Final   Gram Stain   Final     RARE WBC PRESENT,BOTH PMN AND MONONUCLEAR NO ORGANISMS SEEN    Culture   Final    RARE GRAM NEGATIVE RODS CULTURE REINCUBATED FOR BETTER GROWTH SUSCEPTIBILITIES TO FOLLOW Performed at [facility], [place] [address], [place], [place] [address]    Report Status PENDING  Incomplete  Aerobic/Anaerobic Culture (surgical/deep wound)     Status: None (Preliminary result)   Collection Time: 08/16/20  9:52 AM   Specimen: PATH Other; Tissue  Result Value Ref Range Status   Specimen Description   Final    ABSCESS Performed at [facility], [address] [place], [place] [address]    Special Requests   Final    SACRAL DECUBITUS ULCER Performed at [facility], [address] [place], [place] [address]    Gram Stain NO WBC SEEN NO ORGANISMS SEEN NO ORGANISMS SEEN   Final   Culture   Final    CULTURE REINCUBATED FOR BETTER GROWTH Performed at [facility], [place] [address], [place], [place] [address]    Report Status PENDING  Incomplete      Radiology Studies: No results found.   Scheduled Meds:  bisacodyl  10 mg Rectal QHS   Chlorhexidine Gluconate Cloth  6 each Topical Daily   famotidine  20 mg Per Tube Daily   feeding supplement (OSMOLITE 1.5 CAL)  237 mL Per Tube Q3H   feeding supplement (PROSource TF)  45 mL Per Tube BID   ferrous sulfate  78 mg Oral Q breakfast   fludrocortisone  0.2 mg Per Tube Daily   FLUoxetine  40 mg Per Tube Daily   free water  300 mL Per Tube Q4H   influenza vaccine adjuvanted  0.5 mL Intramuscular Tomorrow-1000   midodrine  10 mg Per Tube TID WC   nutrition supplement (JUVEN)  1 packet Per Tube BID BM   rivaroxaban  10 mg Per Tube Daily   Continuous Infusions:  piperacillin-tazobactam (  ZOSYN)  IV 3.375 g (08/18/20 0357)   vancomycin 1,000 mg (08/18/20 0111)     LOS: 4 days   Darliss Cheney, MD [facility] If 7PM-7AM, please contact night-coverage 08/18/2020, 10:24 AM

## 2020-08-18 NOTE — Progress Notes (Signed)
08/18/2020  Subjective: Patient is 2 Days Post-Op debridement of a stage IV sacral decubitus ulcer.  No acute events overnight.  Patient's wife reports that he looks much better today and his color has improved.  His WBC has normalized and is 10.0 this morning.  Hemoglobin stable at 8.4.  Cultures from the OR are currently showing rare Enterobacter cloacae in the bone biopsy and currently pending for the abscess itself.  Patient is currently on vancomycin and Zosyn.  Wound VAC was changed this morning by the [place] nurse  Vital signs: Temp:  [97.7 F (36.5 C)-98.9 F (37.2 C)] 98.9 F (37.2 C) (11/26 1146) Pulse Rate:  [59-79] 79 (11/26 1146) Resp:  [17-19] 19 (11/26 1146) BP: (113-129)/(58-70) 113/66 (11/26 1146) SpO2:  [98 %-99 %] 98 % (11/26 1146) FiO2 (%):  [28 %] 28 % (11/26 0425)   Intake/Output: 11/25 0701 - 11/26 0700 In: 1011 [NG/GT:711; IV Piggyback:300] Out: 3875 [Urine:3800; Drains:75] Last BM Date: 08/18/20  Physical Exam: Constitutional: No acute distress Skin: Wound VAC in place with bridging to the left hip with good seal.  Dark serosanguineous fluid in the canister.  Labs:  Recent Labs    08/17/20 0634 08/18/20 0357  WBC 13.8* 10.0  HGB 8.0* 8.4*  HCT 25.4* 25.7*  PLT 293 294   Recent Labs    08/17/20 0634 08/18/20 0357  NA 141 144  K 3.8 3.5  CL 104 104  CO2 28 32  GLUCOSE 198* 134*  BUN 20 24*  CREATININE 0.70 0.55*  CALCIUM 9.2 8.9   No results for input(s): LABPROT, INR in the last 72 hours.  Imaging: DG Chest Port 1 View  Result Date: 08/18/2020 CLINICAL DATA:  Ronchi at both lung bases. EXAM: PORTABLE CHEST 1 VIEW COMPARISON:  Chest radiograph 08/14/2020. FINDINGS: Unchanged position of a tracheostomy tube which terminates at the level of the clavicular heads. Heart size within normal limits. Shallow inspiration radiograph. Significant interval improvement in aeration of the right lung base with only subtle persistent opacity within the  medial right lower lobe. Unchanged minimal opacity within the lateral left lung base with an appearance most suggestive of atelectasis. No sizable pleural effusion or evidence of pneumothorax. No acute bony abnormality identified. Fusion hardware within the visualized cervical spine. IMPRESSION: Shallow inspiration radiograph. Significant interval improvement in aeration of the right lung base with only subtle persistent opacity in the medial right lower lobe. Findings may reflect improving atelectasis and/or pneumonia. Unchanged subtle opacity within the lateral left lung base with an appearance most suggestive of atelectasis. Electronically Signed   By: Kellie Simmering DO   On: 08/18/2020 11:27    Assessment/Plan: This is a 69 y.o. male s/p debridement of stage IV sacral decubitus ulcer.  -Cultures currently showing Enterobacter for bone and pending for the abscess itself.  Continue IV antibiotics.  ID was consulted for recommendations based on his osteomyelitis. -Wound VAC changes Monday, Wednesday, Friday.  We will coordinate with the wound nurse to do the dressing change together on Monday. -Continue offloading the patient and rotating him to decrease the pressure in his left side. -No need for further debridement at this point.  Will follow peripherally.   Melvyn Neth, [facility]

## 2020-08-18 NOTE — Progress Notes (Signed)
Pharmacy Antibiotic Note  Ryan Day is a 69 y.o. male admitted on 08/14/2020 with pneumonia.  Pharmacy has been consulted for Vancomycin and Zosyn dosing. Patient was previously on Vancomycin - last dose was yesterday at ~ 0630.  Scr 0.79 mg/dL today  Plan: Vancomycin discontinued 11/26  Continue Zosyn 3.37 g Q8H - extended infusion   Height: 6\' 5"  (195.6 cm) Weight: 113.1 kg (249 lb 5.4 oz) IBW/kg (Calculated) : 89.1  Temp (24hrs), Avg:98.3 F (36.8 C), Min:97.7 F (36.5 C), Max:98.9 F (37.2 C)  Recent Labs  Lab 08/14/20 1647 08/14/20 1649 08/14/20 2224 08/15/20 0539 08/16/20 1201 08/17/20 0634 08/18/20 0357  WBC 11.0*  --   --  10.5 18.8* 13.8* 10.0  CREATININE 0.53*  --   --  0.60* 0.79 0.70 0.55*  LATICACIDVEN  --  1.2 1.5  --   --   --   --     Estimated Creatinine Clearance: 123.4 mL/min (A) (by C-G formula based on SCr of 0.55 mg/dL (L)).    No Known Allergies  Antimicrobials this admission:  11/22 Vancomycin> 11/23, then resumed 11/24 >> 11/26 11/22 Cefepime >> 11/23   Microbiology results:  11/22 BCx: NGTD  11/24 WCx (bone): Enterobacter cloacae   11/23 Resp. Cx: GPR, GPC  Thank you for allowing pharmacy to be a part of this patient's care.  Paulina Fusi, PharmD, BCPS 08/18/2020 2:15 PM

## 2020-08-18 NOTE — Consult Note (Signed)
[place] Nurse wound follow up Wound type: surgically debrided Stage 4 Pressure Injury Measurement:4cmx 3cm x 3xm with undermining that is 2.5 from 11-2 o'clock  Wound bed: ruddy, yellow fibrinous, some grey tissue.  Drainage (amount, consistency, odor) minimal in VAC canister Periwound: epibole of wound edges, some maceration and stool under the distal aspect of the dressing Dressing procedure/placement/frequency: Removed old NPWT dressing Cleansed wound with normal saline and skin wipes to clean stool  Skin protected to the hip with VAC drape for foam bridge  Filled wound with  _2__ piece of silver  foam, __1__ piece of white foam 1pc of silver foam used to bridge to the left hip Sealed NPWT dressing at 14mm HG Patient is a quadriplegic so no pain medication needed, verified with surgeon and bedside nurse Patient tolerated procedure well    [place] nurse will continue to provide NPWT dressing changed due to the complexity of the dressing change.   Unclear how silver foam will be obtained for dressings moving forward.  This product is not used inpatient, only known to be housed in the OR.   [place], [place], [place]

## 2020-08-18 NOTE — Consult Note (Signed)
NAME: Ryan Day  DOB: 04-Aug-1951  MRN: [id number]  Date/Time: 08/18/2020 11:39 AM  REQUESTING PROVIDER: Dr. Doristine Bosworth Subjective:  REASON FOR CONSULT: Sacral decubitus ? Ryan Day is a 69 y.o. male with a history of hypertension recent fall and C3-C6 cord compression at C5 vertebral body admitted on  08/14/20 with cough and shortness of breath Patient has had a complicated history for the past few months. Patient had a prolonged admission at [facility] between 04/26/2020 until 06/07/2020. He presented after a mechanical fall  after tripping over auramine while drinking He sustained a C5 vertebral body teardrop type fracture and C3-C6 cord compression and edema. He was taken to the OR by the orthospine team for cervical decompression. He developed spinal shock requiring vasopressors to maintain blood pressure. He had a prolonged ICU course of more than 1 month and complicated by respiratory failure and PEA arrest. He had tracheostomy and PEG placement. He continued to have episodic neurogenic bradycardia and hypotension. He also had transaminitis. He had AKI during that hospitalization. He also had aspiration pneumonia and was treated with 7 days of Zosyn while in the hospital. He was discharged to rehab. Was then rehospitalized between 11-2/21 and 08/08/2020 at [facility] for shortness of breath cough with copious amount of sputum and was found to have acute on chronic respiratory failure with aspiration pneumonia and intermittent mucous plugging. He underwent bronchoscopy for right lower lobe collapse to clear the mucous plug. In the discharge he was sent home with palliative care.Ryan Day He was noted to also have an unstageable decubitus ulcer during that hospitalization. On 08/14/2020 he was brought back to the hospital after being assessed by palliative care at home For sacral wound and aspiratory distress. In the ED BP 97/62, temperature of 98.7, heart rate of 86, respirate rate of 17 and pulse ox of  98%. Labs revealed creatinine of 0.53, potassium of 3.4, albumin of 2.6, WBC of 11, hemoglobin of 8.7 and platelet of 318. Lactate was 1.2. He had a CT of the pelvis which noted a large left sacral decubitus ulcer with evidence of sacral osteomyelitis. He was started on vancomycin and Zosyn. He was seen by Dr. Celine Day surgeon and underwent debridement of skin subcutaneous tissue and muscle and the deep bone biopsy was taken on 08/16/2020. The culture from the bone biopsy is Enterobacter. And the histopathological examination is acute osteomyelitis. I am asked to see the patient for the same. Pt says he was functional before the fall He now has trouble clearing secretions Unable to move any extremities  Past Medical History:  Diagnosis Date  . Acute on chronic respiratory failure with hypoxia ([place])   . Acute renal injury due to hypovolemia ([place])   . Autonomic instability   . Cardiac arrest ([place])   . Cervical spinal cord injury, sequela ([place])   . Elevated alkaline phosphatase level   . 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 Hyperlipidemia   . Hypertension     Past Surgical History:  Procedure Laterality Date  . APPLICATION OF WOUND VAC N/A 08/16/2020   Procedure: APPLICATION OF WOUND VAC;  Surgeon: Ryan Maudlin, MD;  Location: [facility] ORS;  Service: General;  Laterality: N/A;  . COLONOSCOPY WITH PROPOFOL N/A 12/05/2017   Procedure: COLONOSCOPY WITH PROPOFOL;  Surgeon: Lin Landsman, MD;  Location: [facility] ENDOSCOPY;  Service: Gastroenterology;  Laterality: N/A;  . ESOPHAGOGASTRODUODENOSCOPY  12/05/2017   Procedure: ESOPHAGOGASTRODUODENOSCOPY (EGD);  Surgeon: Lin Landsman, MD;  Location: [facility] ENDOSCOPY;  Service: Gastroenterology;;  . INCISION AND DRAINAGE ABSCESS N/A 08/16/2020   Procedure: INCISION AND DRAINAGE ABSCESS, SACRAL;  Surgeon: Ryan Maudlin, MD;  Location: [facility] ORS;  Service: General;  Laterality: N/A;    c3-c6 laminectomy and posterior instrumentation  and fusions Social History   Socioeconomic History  . Marital status: Married    Spouse name: Not on file  . Number of children: Not on file  . Years of education: Not on file  . Highest education level: Not on file  Occupational History  . Not on file  Tobacco Use  . Smoking status: Never Smoker  . Smokeless tobacco: Never Used  Substance and Sexual Activity  . Alcohol use: Yes    Comment: 1 or less  . Drug use: No  . Sexual activity: Not on file  Other Topics Concern  . Not on file  Social History Narrative  . Not on file   Social Determinants of Health   Financial Resource Strain:   . Difficulty of Paying Living Expenses: Not on file  Food Insecurity:   . Worried About Charity fundraiser in the Last Year: Not on file  . Ran Out of Food in the Last Year: Not on file  Transportation Needs:   . Lack of Transportation (Medical): Not on file  . Lack of Transportation (Non-Medical): Not on file  Physical Activity:   . Days of Exercise per Week: Not on file  . Minutes of Exercise per Session: Not on file  Stress:   . Feeling of Stress : Not on file  Social Connections:   . Frequency of Communication with Friends and Family: Not on file  . Frequency of Social Gatherings with Friends and Family: Not on file  . Attends Religious Services: Not on file  . Active Member of Clubs or Organizations: Not on file  . Attends Archivist Meetings: Not on file  . Marital Status: Not on file  Intimate Partner Violence:   . Fear of Current or Ex-Partner: Not on file  . Emotionally Abused: Not on file  . Physically Abused: Not on file  . Sexually Abused: Not on file    Family History  Problem Relation Age of Onset  . Cancer Father        lung  . Cancer Brother        throat  . Heart disease Brother 66  . Liver cancer Brother   . Alcohol abuse Brother    No Known Allergies   Current Facility-Administered Medications  Medication Dose Route Frequency Provider Last  Rate Last Admin  . acetaminophen (TYLENOL) tablet 650 mg  650 mg Per Tube Q6H PRN Ryan Maudlin, MD      . bisacodyl (DULCOLAX) suppository 10 mg  10 mg Rectal QHS Ryan Maudlin, MD   10 mg at 08/16/20 2108  . Chlorhexidine Gluconate Cloth 2 % PADS 6 each  6 each Topical Daily Ryan Maudlin, MD   6 each at 08/18/20 [phone]  . famotidine (PEPCID) tablet 20 mg  20 mg Per Tube Daily Ryan Maudlin, MD   20 mg at 08/18/20 0907  . feeding supplement (OSMOLITE 1.5 CAL) liquid 237 mL  237 mL Per Tube Q3H Ryan Maudlin, MD   237 mL at 08/18/20 0910  . feeding supplement (PROSource TF) liquid 45 mL  45 mL Per Tube BID Ryan Maudlin, MD   45 mL at 08/18/20 0909  . ferrous sulfate 220 (44 Fe) MG/5ML solution 78 mg  78 mg  Oral Q breakfast Ryan Maudlin, MD   78 mg at 08/18/20 9563  . fludrocortisone (FLORINEF) tablet 0.2 mg  0.2 mg Per Tube Daily Ryan Maudlin, MD   0.2 mg at 08/18/20 0906  . FLUoxetine (PROZAC) 20 MG/5ML solution 40 mg  40 mg Per Tube Daily Ryan Maudlin, MD   40 mg at 08/18/20 0907  . free water 300 mL  300 mL Per Tube Q4H Ryan Maudlin, MD   300 mL at 08/18/20 0910  . glycopyrrolate (ROBINUL) tablet 1 mg  1 mg Per Tube TID PRN Ryan Maudlin, MD   1 mg at 08/18/20 0913  . influenza vaccine adjuvanted (FLUAD) injection 0.5 mL  0.5 mL Intramuscular Tomorrow-1000 Adefeso, Oladapo, DO      . midodrine (PROAMATINE) tablet 10 mg  10 mg Per Tube TID WC Ryan Maudlin, MD   10 mg at 08/18/20 0906  . nutrition supplement (JUVEN) (JUVEN) powder packet 1 packet  1 packet Per Tube BID BM Ryan Maudlin, MD   1 packet at 08/18/20 [phone]  . ondansetron (ZOFRAN) tablet 4 mg  4 mg Per Tube Q8H PRN Ryan Maudlin, MD      . piperacillin-tazobactam (ZOSYN) IVPB 3.375 g  3.375 g Intravenous Q8H Duncan, Asajah R, RPH 12.5 mL/hr at 08/18/20 0357 3.375 g at 08/18/20 0357  . rivaroxaban (XARELTO) tablet 10 mg  10 mg Per Tube Daily Ryan Maudlin, MD   10 mg at 08/18/20 0906  .  vancomycin (VANCOCIN) IVPB 1000 mg/200 mL premix  1,000 mg Intravenous Q12H Rowland Lathe, RPH   Stopped at 08/18/20 0211     Abtx:  Anti-infectives (From admission, onward)   Start     Dose/Rate Route Frequency Ordered Stop   08/17/20 0200  vancomycin (VANCOCIN) IVPB 1000 mg/200 mL premix        1,000 mg 200 mL/hr over 60 Minutes Intravenous Every 12 hours 08/16/20 1536     08/16/20 1300  vancomycin (VANCOCIN) 2,500 mg in sodium chloride 0.9 % 500 mL IVPB        2,500 mg 250 mL/hr over 120 Minutes Intravenous  Once 08/16/20 1047 08/16/20 1600   08/16/20 1200  piperacillin-tazobactam (ZOSYN) IVPB 3.375 g        3.375 g 12.5 mL/hr over 240 Minutes Intravenous Every 8 hours 08/16/20 1047     08/16/20 1000  vancomycin (VANCOCIN) IVPB 1000 mg/200 mL premix  Status:  Discontinued        1,000 mg 200 mL/hr over 60 Minutes Intravenous  Once 08/16/20 0947 08/16/20 1536   08/16/20 0945  vancomycin (VANCOCIN) 1,000 mg in sodium chloride 0.9 % 250 mL IVPB  Status:  Discontinued        1,000 mg 250 mL/hr over 60 Minutes Intravenous  Once 08/16/20 0932 08/16/20 0947   08/15/20 0600  vancomycin (VANCOCIN) IVPB 1000 mg/200 mL premix  Status:  Discontinued        1,000 mg 200 mL/hr over 60 Minutes Intravenous Every 12 hours 08/15/20 0036 08/15/20 1416   08/15/20 0300  ceFEPIme (MAXIPIME) 2 g in sodium chloride 0.9 % 100 mL IVPB  Status:  Discontinued        2 g 200 mL/hr over 30 Minutes Intravenous Every 8 hours 08/15/20 0035 08/15/20 1416   08/14/20 1845  vancomycin (VANCOCIN) IVPB 1000 mg/200 mL premix        1,000 mg 200 mL/hr over 60 Minutes Intravenous  Once 08/14/20 1840 08/14/20 2029   08/14/20 1845  ceFEPIme (MAXIPIME)  2 g in sodium chloride 0.9 % 100 mL IVPB        2 g 200 mL/hr over 30 Minutes Intravenous  Once 08/14/20 1840 08/14/20 1926      REVIEW OF SYSTEMS:  Const: negative fever, negative chills,  weight loss Eyes: negative diplopia or visual changes, negative eye pain ENT:  negative coryza, negative sore throat Resp:  cough, dyspnea Cards: negative for chest pain, palpitations, lower extremity edema GU: has foley for neurogenic bladder GI: Negative for abdominal pain,  Skin:sacral woud Heme: negative for easy bruising and gum/nose bleeding MS: bed bound  Neurolo:quadriplegia Psych: negative for feelings of anxiety, depression  Endocrine: negative for thyroid, diabetes Allergy/Immunology- negative for any medication or food allergies ?  Objective:  VITALS:  BP 126/65 (BP Location: Left Arm)   Pulse 70   Temp 97.7 F (36.5 C)   Resp 19   Ht 6\' 5"  (1.956 m)   Wt 113.1 kg   SpO2 99%   BMI 29.57 kg/m  PHYSICAL EXAM:  General: Alert,responds appropriately to questions, oriented in person, place, time  Head: Normocephalic, without obvious abnormality, atraumatic. Eyes: Conjunctivae clear, anicteric sclerae. Pupils are equal ENT cannot examine Neck:tracheostomy no carotid bruit and no JVD. Back: Wound covered with wound vac  Lungs: b/l air entry- rhonchi Heart: s1s2 Abdomen: Soft, npeg in place Extremities: edema ankles Skin: No rashes or lesions. Or bruising Lymph: Cervical, supraclavicular normal.    Neurologic: quadriplegia- unable to move extremities Pertinent Labs Lab Results CBC    Component Value Date/Time   WBC 10.0 08/18/2020 0357   RBC 2.93 (L) 08/18/2020 0357   HGB 8.4 (L) 08/18/2020 0357   HGB 14.7 02/01/2020 0927   HCT 25.7 (L) 08/18/2020 0357   HCT 44.1 02/01/2020 0927   PLT 294 08/18/2020 0357   PLT 267 02/01/2020 0927   MCV 87.7 08/18/2020 0357   MCV 96 02/01/2020 0927   MCH 28.7 08/18/2020 0357   MCHC 32.7 08/18/2020 0357   RDW 18.3 (H) 08/18/2020 0357   RDW 12.3 02/01/2020 0927   LYMPHSABS 0.9 08/14/2020 1647   LYMPHSABS 1.5 02/01/2020 0927   MONOABS 1.8 (H) 08/14/2020 1647   EOSABS 0.1 08/14/2020 1647   EOSABS 0.1 02/01/2020 0927   BASOSABS 0.0 08/14/2020 1647   BASOSABS 0.0 02/01/2020 0927    CMP  Latest Ref Rng & Units 08/18/2020 08/17/2020 08/16/2020  Glucose 70 - 99 mg/dL 134(H) 198(H) 137(H)  BUN 8 - 23 mg/dL 24(H) 20 18  Creatinine 0.61 - 1.24 mg/dL 0.55(L) 0.70 0.79  Sodium 135 - 145 mmol/L 144 141 140  Potassium 3.5 - 5.1 mmol/L 3.5 3.8 3.9  Chloride 98 - 111 mmol/L 104 104 102  CO2 22 - 32 mmol/L 32 28 29  Calcium 8.9 - 10.3 mg/dL 8.9 9.2 9.4  Total Protein 6.5 - 8.1 g/dL - - -  Total Bilirubin 0.3 - 1.2 mg/dL - - -  Alkaline Phos 38 - 126 U/L - - -  AST 15 - 41 U/L - - -  ALT 0 - 44 U/L - - -      Microbiology: Recent Results (from the past 240 hour(s))  Resp Panel by RT-PCR (Flu A&B, Covid) Nasopharyngeal Swab     Status: None   Collection Time: 08/14/20  4:51 PM   Specimen: Nasopharyngeal Swab; Nasopharyngeal(NP) swabs in vial transport medium  Result Value Ref Range Status   SARS Coronavirus 2 by RT PCR NEGATIVE NEGATIVE Final    Comment: (NOTE) SARS-CoV-2  target nucleic acids are NOT DETECTED.  The SARS-CoV-2 RNA is generally detectable in upper respiratory specimens during the acute phase of infection. The lowest concentration of SARS-CoV-2 viral copies this assay can detect is 138 copies/mL. A negative result does not preclude SARS-Cov-2 infection and should not be used as the sole basis for treatment or other patient management decisions. A negative result may occur with  improper specimen collection/handling, submission of specimen other than nasopharyngeal swab, presence of viral mutation(s) within the areas targeted by this assay, and inadequate number of viral copies(<138 copies/mL). A negative result must be combined with clinical observations, patient history, and epidemiological information. The expected result is Negative.  Fact Sheet for Patients:  EntrepreneurPulse.com.au  Fact Sheet for Healthcare Providers:  IncredibleEmployment.be  This test is no t yet approved or cleared by the [place] FDA  and  has been authorized for detection and/or diagnosis of SARS-CoV-2 by FDA under an Emergency Use Authorization (EUA). This EUA will remain  in effect (meaning this test can be used) for the duration of the COVID-19 declaration under Section 564(b)(1) of the Act, 21 U.S.C.section 360bbb-3(b)(1), unless the authorization is terminated  or revoked sooner.       Influenza A by PCR NEGATIVE NEGATIVE Final   Influenza B by PCR NEGATIVE NEGATIVE Final    Comment: (NOTE) The Xpert Xpress SARS-CoV-2/FLU/RSV plus assay is intended as an aid in the diagnosis of influenza from Nasopharyngeal swab specimens and should not be used as a sole basis for treatment. Nasal washings and aspirates are unacceptable for Xpert Xpress SARS-CoV-2/FLU/RSV testing.  Fact Sheet for Patients: EntrepreneurPulse.com.au  Fact Sheet for Healthcare Providers: IncredibleEmployment.be  This test is not yet approved or cleared by the [place] FDA and has been authorized for detection and/or diagnosis of SARS-CoV-2 by FDA under an Emergency Use Authorization (EUA). This EUA will remain in effect (meaning this test can be used) for the duration of the COVID-19 declaration under Section 564(b)(1) of the Act, 21 U.S.C. section 360bbb-3(b)(1), unless the authorization is terminated or revoked.  Performed at [facility], [address] [place], [place] [address]   Culture, blood (routine x 2)     Status: None (Preliminary result)   Collection Time: 08/14/20  6:44 PM   Specimen: BLOOD  Result Value Ref Range Status   Specimen Description BLOOD LEFT ANTECUBITAL  Final   Special Requests   Final    BOTTLES DRAWN AEROBIC AND ANAEROBIC Blood Culture results may not be optimal due to an excessive volume of blood received in culture bottles   Culture   Final    NO GROWTH 4 DAYS Performed at [facility], [address], [place], [place] [address]    Report  Status PENDING  Incomplete  Culture, blood (routine x 2)     Status: None (Preliminary result)   Collection Time: 08/14/20  6:45 PM   Specimen: BLOOD  Result Value Ref Range Status   Specimen Description BLOOD BLOOD RIGHT FOREARM  Final   Special Requests   Final    BOTTLES DRAWN AEROBIC AND ANAEROBIC Blood Culture adequate volume   Culture   Final    NO GROWTH 4 DAYS Performed at [facility], [address] [place], [place] [address]    Report Status PENDING  Incomplete  Culture, respiratory     Status: None   Collection Time: 08/15/20  3:20 AM   Specimen: SPU  Result Value Ref Range Status   Specimen Description  Final    SPUTUM Performed at [facility], [address] [place], [place] [address]    Special Requests   Final    NONE Performed at [facility], [place], [place] [address]    Gram Stain   Final    ABUNDANT WBC PRESENT,BOTH PMN AND MONONUCLEAR FEW GRAM POSITIVE RODS RARE GRAM POSITIVE COCCI IN PAIRS    Culture   Final    RARE Normal respiratory flora-no Staph aureus or Pseudomonas seen Performed at [facility], [address], [place], [place] [address]    Report Status 08/17/2020 FINAL  Final  Aerobic/Anaerobic Culture (surgical/deep wound)     Status: None (Preliminary result)   Collection Time: 08/16/20  9:51 AM   Specimen: PATH Other; Tissue  Result Value Ref Range Status   Specimen Description TISSUE SACRAL  Final   Special Requests BONE  Final   Gram Stain   Final    RARE WBC PRESENT,BOTH PMN AND MONONUCLEAR NO ORGANISMS SEEN Performed at [facility], [place] [address], [place], [place] [address]    Culture RARE ENTEROBACTER CLOACAE  Final   Report Status PENDING  Incomplete   Organism ID, Bacteria ENTEROBACTER CLOACAE  Final      Susceptibility   Enterobacter cloacae - MIC*    CEFAZOLIN >=64 RESISTANT Resistant     CEFEPIME <=0.12 SENSITIVE Sensitive     CEFTAZIDIME <=1 SENSITIVE  Sensitive     CIPROFLOXACIN <=0.25 SENSITIVE Sensitive     GENTAMICIN <=1 SENSITIVE Sensitive     IMIPENEM 0.5 SENSITIVE Sensitive     TRIMETH/SULFA <=20 SENSITIVE Sensitive     PIP/TAZO <=4 SENSITIVE Sensitive     * RARE ENTEROBACTER CLOACAE  Aerobic/Anaerobic Culture (surgical/deep wound)     Status: None (Preliminary result)   Collection Time: 08/16/20  9:52 AM   Specimen: PATH Other; Tissue  Result Value Ref Range Status   Specimen Description   Final    ABSCESS Performed at [facility], [address], [place], [place] [address]    Special Requests   Final    SACRAL DECUBITUS ULCER Performed at [name], [address] [place], [place] [address]    Gram Stain NO WBC SEEN NO ORGANISMS SEEN NO ORGANISMS SEEN   Final   Culture   Final    CULTURE REINCUBATED FOR BETTER GROWTH Performed at [facility], [place] [address], [place], [place] [address]    Report Status PENDING  Incomplete    IMAGING RESULTS:   I have personally reviewed the films Rt lower lobe infiltrate improving ? Impression/Recommendation ?Patient with  cervical spine injury/fracture at [facility]  following a fall and was at [facility] in August and underwent decompressive surgery and arthrodesis and fusion c3-c6 on 04/26/20. He has quadriplegia,  Has tracheostomy, PEG and Foley.  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 Acute osteomyelitis of the sacral bone Enterobacter in  culture. Is is a new ulcer we will treated with 6 weeks of IV antibiotics with constant repositioning/air matrress and proper local wound care to give him  a chance to heal. He has wound vac and was not able to see the wound after surgery May have to think about diverting colostomy if there is fecal contamination Continue IV zosyn - DC Iv vanco as no MRSA  Rt lower lobe pneumonia- mucus plugs, due to inadequate clearing of secretions, also risk for aspiration- improving with frequent suction  Anemia from  all of the above and prolonged hospitlaization ? ___________________________________________________ Discussed with patient, requesting provider Note:  This document was prepared using Dragon voice recognition software and may include unintentional dictation errors.

## 2020-08-19 DIAGNOSIS — J690 Pneumonitis due to inhalation of food and vomit: Secondary | ICD-10-CM | POA: Diagnosis not present

## 2020-08-19 LAB — CULTURE, BLOOD (ROUTINE X 2)
Culture: NO GROWTH
Culture: NO GROWTH
Special Requests: ADEQUATE

## 2020-08-19 LAB — CBC WITH DIFFERENTIAL/PLATELET
Abs Immature Granulocytes: 0.04 10*3/uL (ref 0.00–0.07)
Basophils Absolute: 0 10*3/uL (ref 0.0–0.1)
Basophils Relative: 0 %
Eosinophils Absolute: 0.3 10*3/uL (ref 0.0–0.5)
Eosinophils Relative: 4 %
HCT: 28.8 % — ABNORMAL LOW (ref 39.0–52.0)
Hemoglobin: 8.8 g/dL — ABNORMAL LOW (ref 13.0–17.0)
Immature Granulocytes: 1 %
Lymphocytes Relative: 17 %
Lymphs Abs: 1.4 10*3/uL (ref 0.7–4.0)
MCH: 27.9 pg (ref 26.0–34.0)
MCHC: 30.6 g/dL (ref 30.0–36.0)
MCV: 91.4 fL (ref 80.0–100.0)
Monocytes Absolute: 1.1 10*3/uL — ABNORMAL HIGH (ref 0.1–1.0)
Monocytes Relative: 13 %
Neutro Abs: 5.4 10*3/uL (ref 1.7–7.7)
Neutrophils Relative %: 65 %
Platelets: 299 10*3/uL (ref 150–400)
RBC: 3.15 MIL/uL — ABNORMAL LOW (ref 4.22–5.81)
RDW: 18.1 % — ABNORMAL HIGH (ref 11.5–15.5)
WBC: 8.2 10*3/uL (ref 4.0–10.5)
nRBC: 0 % (ref 0.0–0.2)

## 2020-08-19 LAB — AEROBIC/ANAEROBIC CULTURE W GRAM STAIN (SURGICAL/DEEP WOUND)

## 2020-08-19 LAB — COMPREHENSIVE METABOLIC PANEL
ALT: 49 U/L — ABNORMAL HIGH (ref 0–44)
AST: 28 U/L (ref 15–41)
Albumin: 2.5 g/dL — ABNORMAL LOW (ref 3.5–5.0)
Alkaline Phosphatase: 125 U/L (ref 38–126)
Anion gap: 8 (ref 5–15)
BUN: 25 mg/dL — ABNORMAL HIGH (ref 8–23)
CO2: 31 mmol/L (ref 22–32)
Calcium: 9.4 mg/dL (ref 8.9–10.3)
Chloride: 106 mmol/L (ref 98–111)
Creatinine, Ser: 0.62 mg/dL (ref 0.61–1.24)
GFR, Estimated: >60 mL/min (ref >60)
Glucose, Bld: 116 mg/dL — ABNORMAL HIGH (ref 70–99)
Potassium: 3.7 mmol/L (ref 3.5–5.1)
Sodium: 145 mmol/L (ref 135–145)
Total Bilirubin: 0.5 mg/dL (ref 0.3–1.2)
Total Protein: 7.5 g/dL (ref 6.5–8.1)

## 2020-08-19 NOTE — Progress Notes (Signed)
PROGRESS NOTE    Ryan Day  DDU:[id number] DOB: 11/13/1950 DOA: 08/14/2020 PCP: Valerie Roys, DO   Brief hospital summary:  Ryan Day is a 69 y.o. 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  Patient also have a sacral bedsore which has been concerning to patient's wife due to malodorous and draining pus.    Assessment & Plan:   Active Problems:   Hyperlipidemia   Elevated alkaline phosphatase level   Spinal cord injury, cervical region, sequela (HCC)   Quadriplegia (HCC)   Neurogenic orthostatic hypotension (HCC)   Sacral decubitus ulcer, stage II (HCC)   Aspiration pneumonia (HCC)   Pneumonia   Hypokalemia   Hypoalbuminemia   Leukocytosis   Decubitus ulcer of sacral region, stage 4 (HCC)      Aspiration pneumonia: --Chest x-ray showed partial consolidation at the right base, which has been present since last hospitalization.  But also suggested possible pneumonia.  No fever but some leukocytosis and has rhonchi on examination today.  Repeat x-ray shows some improvement.  Increased airway secretion Hx of mucus plugging  --Wife reported difficulty suctioning secretion at home due to secretion being too thick.  Pt has been on scopolamine patch for a while.  RT, however, noted thin copious amount of white secretion. --scopolamine patch d/c'ed on 11/23 PLAN: --Robinul 1 mg TID PRN for increased secretion --pulm hygiene with RT --trach suctioning per RT  Sepsis 2/2 Large left sacral decubitus ulcer Left sacral osteomyelitis Malodorous purulent drainage noted on sacral decubitus ulcer by wife.  S/p  OR large debridement by general surgery on 08/16/2020.  Now has wound VAC.  Patient remains on IV vancomycin and cefepime.  All the blood cultures are negative so far.  Wound culture is growing enterococci.  ID consulted.  Recommend 6 weeks of IV Zosyn.  General surgery and ID to look at the wound on Monday and do the dressing changes and then provide further recommendations.  Hypokalemia Resolved  PEG-dependent --wife reported insurance stopped covering for osmolite and pt needs cheaper tube feed option PLAN: --resume tube feed after surgery --TOC to look into insurance coverage of tube feed at home  6.  Hypotension -Blood pressure stable-cont home midodrine and Florinef  7.  Spinal cord injury, cervical region with subsequent quadriplegia Patient had C5 vertebral body fracture, C3-C6 cord compression with edema status post C3-C6 laminectomy with fusion and repair of the dural tear in 04/2020 at [facility]  8.  GERD Continue famotidine per home regimen  10.  Depression --cont home prozac  11.  Anemia, iron def Hemoglobin stable at 8's --anemia workup showed only iron def --cont home iron suppl   DVT prophylaxis: CB:[id number] as DVT ppx Code Status: Full code confirmed by outpatient palliative Family Communication:  Status is: inpatient Dispo:   The patient is from: home Anticipated d/c is to: home Anticipated d/c date is: 2 to 3 days Patient currently is not medically stable to d/c due to: infected sacral decub ulcer just had surgical debridement and remains on IV antibiotics.   Subjective and Interval History:  Seen and examined.  Wife was not at the bedside.  Patient comfortable without any  complaints.   Objective: Vitals:   08/18/20 2247 08/19/20 0544 08/19/20 0758 08/19/20 1145  BP: 132/76 109/64 112/63 134/78  Pulse: 82 (!) 57 75 77  Resp:  16 16 17   Temp:  100 F (37.8 C) 99 F (37.2 C) 99.5 F (37.5 C)  TempSrc:  Oral Axillary Axillary  SpO2: 100% 99% 100%  98%  Weight:      Height:        Intake/Output Summary (Last 24 hours) at 08/19/2020 1339 Last data filed at 08/19/2020 4431 Gross per 24 hour  Intake 72.64 ml  Output 3025 ml  Net -2952.36 ml   Filed Weights   08/15/20 0505  Weight: 113.1 kg    Examination:   General exam: Appears calm and comfortable  Respiratory system: Rhonchi the right middle and lower lobe. Respiratory effort normal. Cardiovascular system: S1 & S2 heard, RRR. No JVD, murmurs, rubs, gallops or clicks. No pedal edema. Gastrointestinal system: Abdomen is nondistended, soft and nontender. No organomegaly or masses felt. Normal bowel sounds heard. Central nervous system: Alert and oriented.  Quadriplegic Skin: Large sacral ulcer with wound VAC placed. Psychiatry: Judgement and insight appear normal. Mood & affect appropriate.   Data Reviewed: I have personally reviewed following labs and imaging studies  CBC: Recent Labs  Lab 08/14/20 1647 08/14/20 1647 08/15/20 0539 08/16/20 1201 08/17/20 0634 08/18/20 0357 08/19/20 0418  WBC 11.0*   < > 10.5 18.8* 13.8* 10.0 8.2  NEUTROABS 8.1*  --   --   --   --   --  5.4  HGB 8.7*   < > 8.1* 8.5* 8.0* 8.4* 8.8*  HCT 27.1*   < > 25.4* 26.3* 25.4* 25.7* 28.8*  MCV 88.9   < > 88.2 87.7 88.8 87.7 91.4  PLT 318   < > 307 292 293 294 299   < > = values in this interval not displayed.   Basic Metabolic Panel: Recent Labs  Lab 08/15/20 0539 08/16/20 1201 08/17/20 0634 08/18/20 0357 08/19/20 0418  NA 139 140 141 144 145  K 3.9 3.9 3.8 3.5 3.7  CL 101 102 104 104 106  CO2 29 29 28  32 31  GLUCOSE 101* 137* 198* 134* 116*  BUN 18 18 20  24* 25*  CREATININE 0.60* 0.79 0.70 0.55* 0.62  CALCIUM 9.4 9.4 9.2 8.9 9.4  MG 2.1 2.1 2.2 2.1  --   PHOS 3.5  --   --   --   --    GFR: Estimated Creatinine Clearance: 123.4 mL/min (by C-G formula based on SCr of 0.62 mg/dL). Liver Function Tests: Recent Labs  Lab 08/14/20 1647 08/15/20 0539 08/19/20 0418  AST 21 20  28   ALT 31 28 49*  ALKPHOS 130* 112 125  BILITOT 0.5 0.7 0.5  PROT 7.4 7.0 7.5  ALBUMIN 2.6* 2.3* 2.5*   No results for input(s): LIPASE, AMYLASE in the last 168 hours. No results for input(s): AMMONIA in the last 168 hours. Coagulation Profile: Recent Labs  Lab 08/15/20 0539  INR 1.2   Cardiac Enzymes: No results for input(s): CKTOTAL, CKMB, CKMBINDEX, TROPONINI in the last 168 hours. BNP (last 3 results) No results for input(s): PROBNP in the last 8760 hours. HbA1C: No results for input(s): HGBA1C in the last 72 hours. CBG: No results for input(s): GLUCAP in the last 168 hours. Lipid Profile: No results for input(s): CHOL, HDL, LDLCALC, TRIG, CHOLHDL, LDLDIRECT in the last 72 hours. Thyroid Function Tests: No results for input(s): TSH, T4TOTAL,  FREET4, T3FREE, THYROIDAB in the last 72 hours. Anemia Panel: No results for input(s): VITAMINB12, FOLATE, FERRITIN, TIBC, IRON, RETICCTPCT in the last 72 hours. Sepsis Labs: Recent Labs  Lab 08/14/20 1649 08/14/20 2224 08/15/20 0539  PROCALCITON  --   --  <0.10  LATICACIDVEN 1.2 1.5  --     Recent Results (from the past 240 hour(s))  Resp Panel by RT-PCR (Flu A&B, Covid) Nasopharyngeal Swab     Status: None   Collection Time: 08/14/20  4:51 PM   Specimen: Nasopharyngeal Swab; Nasopharyngeal(NP) swabs in vial transport medium  Result Value Ref Range Status   SARS Coronavirus 2 by RT PCR NEGATIVE NEGATIVE Final    Comment: (NOTE) SARS-CoV-2 target nucleic acids are NOT DETECTED.  The SARS-CoV-2 RNA is generally detectable in upper respiratory specimens during the acute phase of infection. The lowest concentration of SARS-CoV-2 viral copies this assay can detect is 138 copies/mL. A negative result does not preclude SARS-Cov-2 infection and should not be used as the sole basis for treatment or other patient management decisions. A negative result may occur with  improper specimen collection/handling, submission of specimen  other than nasopharyngeal swab, presence of viral mutation(s) within the areas targeted by this assay, and inadequate number of viral copies(<138 copies/mL). A negative result must be combined with clinical observations, patient history, and epidemiological information. The expected result is Negative.  Fact Sheet for Patients:  EntrepreneurPulse.com.au  Fact Sheet for Healthcare Providers:  IncredibleEmployment.be  This test is no t yet approved or cleared by the [place] FDA and  has been authorized for detection and/or diagnosis of SARS-CoV-2 by FDA under an Emergency Use Authorization (EUA). This EUA will remain  in effect (meaning this test can be used) for the duration of the COVID-19 declaration under Section 564(b)(1) of the Act, 21 U.S.C.section 360bbb-3(b)(1), unless the authorization is terminated  or revoked sooner.       Influenza A by PCR NEGATIVE NEGATIVE Final   Influenza B by PCR NEGATIVE NEGATIVE Final    Comment: (NOTE) The Xpert Xpress SARS-CoV-2/FLU/RSV plus assay is intended as an aid in the diagnosis of influenza from Nasopharyngeal swab specimens and should not be used as a sole basis for treatment. Nasal washings and aspirates are unacceptable for Xpert Xpress SARS-CoV-2/FLU/RSV testing.  Fact Sheet for Patients: EntrepreneurPulse.com.au  Fact Sheet for Healthcare Providers: IncredibleEmployment.be  This test is not yet approved or cleared by the [place] FDA and has been authorized for detection and/or diagnosis of SARS-CoV-2 by FDA under an Emergency Use Authorization (EUA). This EUA will remain in effect (meaning this test can be used) for the duration of the COVID-19 declaration under Section 564(b)(1) of the Act, 21 U.S.C. section 360bbb-3(b)(1), unless the authorization is terminated or revoked.  Performed at [facility], [address]  [place], [place] [address]   Culture, blood (routine x 2)     Status: None   Collection Time: 08/14/20  6:44 PM   Specimen: BLOOD  Result Value Ref Range Status   Specimen Description BLOOD LEFT ANTECUBITAL  Final   Special Requests   Final    BOTTLES DRAWN AEROBIC AND ANAEROBIC Blood Culture results may not be optimal due to an excessive volume of blood received in culture bottles   Culture   Final    NO GROWTH 5 DAYS Performed at [facility], [address], [place], [place] [address]    Report Status 08/19/2020 FINAL  Final  Culture, blood (routine x  2)     Status: None   Collection Time: 08/14/20  6:45 PM   Specimen: BLOOD  Result Value Ref Range Status   Specimen Description BLOOD BLOOD RIGHT FOREARM  Final   Special Requests   Final    BOTTLES DRAWN AEROBIC AND ANAEROBIC Blood Culture adequate volume   Culture   Final    NO GROWTH 5 DAYS Performed at [facility], [address], [place], [place] [address]    Report Status 08/19/2020 FINAL  Final  Culture, respiratory     Status: None   Collection Time: 08/15/20  3:20 AM   Specimen: SPU  Result Value Ref Range Status   Specimen Description   Final    SPUTUM Performed at [facility], [address] [place], [place] [address]    Special Requests   Final    NONE Performed at [facility], [address] [place], [place] [address]    Gram Stain   Final    ABUNDANT WBC PRESENT,BOTH PMN AND MONONUCLEAR FEW GRAM POSITIVE RODS RARE GRAM POSITIVE COCCI IN PAIRS    Culture   Final    RARE Normal respiratory flora-no Staph aureus or Pseudomonas seen Performed at [facility], [place] [address], [place], [place] [address]    Report Status 08/17/2020 FINAL  Final  Aerobic/Anaerobic Culture (surgical/deep wound)     Status: None   Collection Time: 08/16/20  9:51 AM   Specimen: PATH Other; Tissue  Result Value Ref Range Status   Specimen Description TISSUE SACRAL  Final   Special  Requests BONE  Final   Gram Stain   Final    RARE WBC PRESENT,BOTH PMN AND MONONUCLEAR NO ORGANISMS SEEN    Culture   Final    RARE ENTEROBACTER CLOACAE FEW STREPTOCOCCUS CONSTELLATUS FEW BACTEROIDES THETAIOTAOMICRON BETA LACTAMASE POSITIVE Performed at [facility], [place] [address], [place], [place] [address]    Report Status 08/19/2020 FINAL  Final   Organism ID, Bacteria ENTEROBACTER CLOACAE  Final      Susceptibility   Enterobacter cloacae - MIC*    CEFAZOLIN >=64 RESISTANT Resistant     CEFEPIME <=0.12 SENSITIVE Sensitive     CEFTAZIDIME <=1 SENSITIVE Sensitive     CIPROFLOXACIN <=0.25 SENSITIVE Sensitive     GENTAMICIN <=1 SENSITIVE Sensitive     IMIPENEM 0.5 SENSITIVE Sensitive     TRIMETH/SULFA <=20 SENSITIVE Sensitive     PIP/TAZO <=4 SENSITIVE Sensitive     * RARE ENTEROBACTER CLOACAE  Aerobic/Anaerobic Culture (surgical/deep wound)     Status: Abnormal   Collection Time: 08/16/20  9:52 AM   Specimen: PATH Other; Tissue  Result Value Ref Range Status   Specimen Description   Final    ABSCESS Performed at [facility], [address] [place], [place] [address]    Special Requests   Final    SACRAL DECUBITUS ULCER Performed at [facility], [address] [place], [place] [address]    Gram Stain NO WBC SEEN NO ORGANISMS SEEN NO ORGANISMS SEEN   Final   Culture (A)  Final    MULTIPLE ORGANISMS PRESENT, NONE PREDOMINANT FEW BACTEROIDES THETAIOTAOMICRON BETA LACTAMASE POSITIVE Performed at [facility], [place] [address], [place], [place] [address]    Report Status 08/19/2020 FINAL  Final      Radiology Studies: DG Chest Port 1 View  Result Date: 08/18/2020 CLINICAL DATA:  Ronchi at both lung bases. EXAM: PORTABLE CHEST 1 VIEW  COMPARISON:  Chest radiograph 08/14/2020. FINDINGS: Unchanged position of a tracheostomy tube which terminates at the level of the clavicular heads. Heart size within normal limits. Shallow inspiration  radiograph. Significant interval improvement in aeration of the right lung base with only subtle persistent opacity within the medial right lower lobe. Unchanged minimal opacity within the lateral left lung base with an appearance most suggestive of atelectasis. No sizable pleural effusion or evidence of pneumothorax. No acute bony abnormality identified. Fusion hardware within the visualized cervical spine. IMPRESSION: Shallow inspiration radiograph. Significant interval improvement in aeration of the right lung base with only subtle persistent opacity in the medial right lower lobe. Findings may reflect improving atelectasis and/or pneumonia. Unchanged subtle opacity within the lateral left lung base with an appearance most suggestive of atelectasis. Electronically Signed   By: Kellie Simmering DO   On: 08/18/2020 11:27     Scheduled Meds:  bisacodyl  10 mg Rectal QHS   Chlorhexidine Gluconate Cloth  6 each Topical Daily   famotidine  20 mg Per Tube Daily   feeding supplement (OSMOLITE 1.5 CAL)  237 mL Per Tube Q3H   feeding supplement (PROSource TF)  45 mL Per Tube BID   ferrous sulfate  78 mg Oral Q breakfast   fludrocortisone  0.2 mg Per Tube Daily   FLUoxetine  40 mg Per Tube Daily   free water  300 mL Per Tube Q4H   influenza vaccine adjuvanted  0.5 mL Intramuscular Tomorrow-1000   midodrine  10 mg Per Tube TID WC   nutrition supplement (JUVEN)  1 packet Per Tube BID BM   rivaroxaban  10 mg Per Tube Daily   Continuous Infusions:  piperacillin-tazobactam (ZOSYN)  IV 3.375 g (08/19/20 1156)     LOS: 5 days   Darliss Cheney, MD [facility] If 7PM-7AM, please contact night-coverage 08/19/2020, 1:39 PM

## 2020-08-19 NOTE — Plan of Care (Signed)
  Problem: Health Behavior/Discharge Planning: Goal: Ability to manage health-related needs will improve Outcome: Progressing   

## 2020-08-19 NOTE — Plan of Care (Signed)

## 2020-08-20 DIAGNOSIS — J690 Pneumonitis due to inhalation of food and vomit: Secondary | ICD-10-CM | POA: Diagnosis not present

## 2020-08-20 LAB — CBC WITH DIFFERENTIAL/PLATELET
Abs Immature Granulocytes: 0.05 10*3/uL (ref 0.00–0.07)
Basophils Absolute: 0 10*3/uL (ref 0.0–0.1)
Basophils Relative: 0 %
Eosinophils Absolute: 0.2 10*3/uL (ref 0.0–0.5)
Eosinophils Relative: 2 %
HCT: 28.3 % — ABNORMAL LOW (ref 39.0–52.0)
Hemoglobin: 8.7 g/dL — ABNORMAL LOW (ref 13.0–17.0)
Immature Granulocytes: 0 %
Lymphocytes Relative: 10 %
Lymphs Abs: 1.2 10*3/uL (ref 0.7–4.0)
MCH: 27.7 pg (ref 26.0–34.0)
MCHC: 30.7 g/dL (ref 30.0–36.0)
MCV: 90.1 fL (ref 80.0–100.0)
Monocytes Absolute: 1.1 10*3/uL — ABNORMAL HIGH (ref 0.1–1.0)
Monocytes Relative: 9 %
Neutro Abs: 9.2 10*3/uL — ABNORMAL HIGH (ref 1.7–7.7)
Neutrophils Relative %: 79 %
Platelets: 288 10*3/uL (ref 150–400)
RBC: 3.14 MIL/uL — ABNORMAL LOW (ref 4.22–5.81)
RDW: 18.4 % — ABNORMAL HIGH (ref 11.5–15.5)
WBC: 11.7 10*3/uL — ABNORMAL HIGH (ref 4.0–10.5)
nRBC: 0 % (ref 0.0–0.2)

## 2020-08-20 LAB — COMPREHENSIVE METABOLIC PANEL
ALT: 38 U/L (ref 0–44)
AST: 20 U/L (ref 15–41)
Albumin: 2.5 g/dL — ABNORMAL LOW (ref 3.5–5.0)
Alkaline Phosphatase: 121 U/L (ref 38–126)
Anion gap: 12 (ref 5–15)
BUN: 29 mg/dL — ABNORMAL HIGH (ref 8–23)
CO2: 29 mmol/L (ref 22–32)
Calcium: 9.8 mg/dL (ref 8.9–10.3)
Chloride: 108 mmol/L (ref 98–111)
Creatinine, Ser: 0.66 mg/dL (ref 0.61–1.24)
GFR, Estimated: >60 mL/min (ref >60)
Glucose, Bld: 151 mg/dL — ABNORMAL HIGH (ref 70–99)
Potassium: 3.4 mmol/L — ABNORMAL LOW (ref 3.5–5.1)
Sodium: 149 mmol/L — ABNORMAL HIGH (ref 135–145)
Total Bilirubin: 0.7 mg/dL (ref 0.3–1.2)
Total Protein: 7.9 g/dL (ref 6.5–8.1)

## 2020-08-20 MED ORDER — POTASSIUM CHLORIDE 10 MEQ/100ML IV SOLN
10.0000 meq | INTRAVENOUS | Status: AC
  Administered 2020-08-20 (×4): 10 meq via INTRAVENOUS
  Filled 2020-08-20 (×3): qty 100

## 2020-08-20 NOTE — Progress Notes (Signed)
08/20/2020  Subjective: Patient is 4 Days Post-Op status post debridement of sacral decubitus wound.  No acute events.  WBC mildly elevated to 11.7 this morning but denies any pain on his backside.  Wound VAC is in place with good seal.  He will be due for wound VAC change tomorrow.  Or cultures growing Enterobacter in the bone biopsy, confirming diagnosis of osteomyelitis.  Per ID, will need 6 weeks of IV Zosyn  Vital signs: Temp:  [98.1 F (36.7 C)-99.1 F (37.3 C)] 98.2 F (36.8 C) (11/28 1215) Pulse Rate:  [62-81] 67 (11/28 1215) Resp:  [17-20] 20 (11/28 1215) BP: (103-146)/(66-79) 119/69 (11/28 1215) SpO2:  [98 %-100 %] 98 % (11/28 1215) FiO2 (%):  [28 %] 28 % (11/27 1945)   Intake/Output: 11/27 0701 - 11/28 0700 In: 224 [NG/GT:711] Out: 2825 [Urine:2800; Drains:25] Last BM Date: 08/19/20  Physical Exam: Constitutional: No acute distress Skin: Wound VAC in place with good seal with bridge going towards the left hip.  Dark serosanguineous fluid in the canister.  Low output.  Labs:  Recent Labs    08/19/20 0418 08/20/20 0558  WBC 8.2 11.7*  HGB 8.8* 8.7*  HCT 28.8* 28.3*  PLT 299 288   Recent Labs    08/19/20 0418 08/20/20 0558  NA 145 149*  K 3.7 3.4*  CL 106 108  CO2 31 29  GLUCOSE 116* 151*  BUN 25* 29*  CREATININE 0.62 0.66  CALCIUM 9.4 9.8   No results for input(s): LABPROT, INR in the last 72 hours.  Imaging: No results found.  Assessment/Plan: This is a 69 y.o. male s/p debridement of sacral decubitus wound.  -Patient will be due for wound VAC change tomorrow.  We will coordinate with the wound nurse and Dr. Steva Ready so we can evaluate the wound together during the dressing change to treatment need for any further debridement. -Pending that the wound looks good, then from the surgical standpoint would be able to discharge out of the hospital as long as there is a set up for wound VAC at home.   Melvyn Neth, [facility]

## 2020-08-20 NOTE — Progress Notes (Signed)
PROGRESS NOTE    Ryan Day  XFG:[id number] DOB: 1950-12-28 DOA: 08/14/2020 PCP: Valerie Roys, DO   Brief hospital summary:  Ryan Day is a 69 y.o. 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  Patient also have a sacral bedsore which has been concerning to patient's wife due to malodorous and draining pus. S/p OR large debridement by general surgery on 08/16/2020.  Now has wound VAC.  Patient remains on IV vancomycin and cefepime.  All the blood cultures are negative so far.  Wound culture is growing enterococci.  ID consulted.  Recommend 6 weeks of IV Zosyn.  General surgery and ID to look at the wound on Monday and do the dressing changes and then provide further recommendations   Assessment & Plan:   Active Problems:   Hyperlipidemia   Elevated alkaline phosphatase level   Spinal cord injury, cervical region, sequela (HCC)   Quadriplegia (HCC)   Neurogenic orthostatic hypotension (HCC)   Sacral decubitus ulcer, stage II (HCC)   Aspiration pneumonia (HCC)   Pneumonia   Hypokalemia   Hypoalbuminemia   Leukocytosis   Decubitus ulcer of sacral region, stage 4 (HCC)      Aspiration pneumonia: --Chest x-ray showed partial consolidation at the right base, which has been present since last hospitalization.  But also suggested possible pneumonia.  No fever but some leukocytosis and has rhonchi on examination today.  Repeat x-ray shows some improvement.    Increased airway secretion Hx of mucus plugging  --Wife reported difficulty suctioning secretion at home due to secretion being too thick.  Pt has been on  scopolamine patch for a while.  RT, however, noted thin copious amount of white secretion. --scopolamine patch d/c'ed on 11/23 PLAN: --Robinul 1 mg TID PRN for increased secretion --pulm hygiene with RT --trach suctioning per RT  Sepsis 2/2 Large left sacral decubitus ulcer Left sacral osteomyelitis Malodorous purulent drainage noted on sacral decubitus ulcer by wife.  S/p OR large debridement by general surgery on 08/16/2020.  Now has wound VAC.  Patient remains on IV vancomycin and cefepime.  All the blood cultures are negative so far.  Wound culture is growing Enterobacter clcoacae. ID consulted.  Recommend 6 weeks of IV Zosyn.  General surgery and ID to look at the wound on Monday and do the dressing changes and then provide further recommendations.  Hypokalemia 3.4 again today.  Will replace.  PEG-dependent --wife reported insurance stopped covering for osmolite and pt needs cheaper tube feed option PLAN: --resume tube feed after surgery --TOC to look into insurance coverage of tube feed at home  6.  Hypotension -Blood pressure stable-cont home midodrine and Florinef  7.  Spinal cord injury, cervical region with subsequent quadriplegia Patient had C5 vertebral body fracture, C3-C6 cord compression with edema status post C3-C6 laminectomy with fusion and repair of the dural tear in 04/2020 at [facility]  8.  GERD Continue famotidine per home regimen  10.  Depression --cont home prozac  11.  Anemia, iron def Hemoglobin stable at 8's --anemia workup showed only iron def --cont home iron suppl   DVT prophylaxis: [id number] as DVT ppx Code Status: Full code confirmed  by outpatient palliative Family Communication:  Status is: inpatient Dispo:   The patient is from: home Anticipated d/c is to: home Anticipated d/c date is: 2 to 3 days Patient currently is not medically stable to d/c due to: infected sacral decub ulcer just had surgical debridement and remains on IV  antibiotics.   Subjective and Interval History:  Seen and examined.  Wife not at bedside.  Patient continues to feel well without any symptoms.   Objective: Vitals:   08/19/20 2103 08/19/20 2359 08/20/20 0408 08/20/20 0819  BP: 139/75 105/67 119/79 (!) 146/77  Pulse: 68 76 75 62  Resp: 19 20 18 17   Temp: 98.2 F (36.8 C) 98.3 F (36.8 C) 98.1 F (36.7 C) 98.3 F (36.8 C)  TempSrc: Oral Oral Axillary Axillary  SpO2: 100% 100% 100% 100%  Weight:      Height:        Intake/Output Summary (Last 24 hours) at 08/20/2020 1108 Last data filed at 08/20/2020 0850 Gross per 24 hour  Intake 574 ml  Output 2825 ml  Net -2251 ml   Filed Weights   08/15/20 0505  Weight: 113.1 kg    Examination:   General exam: Appears calm and comfortable with trach and PEG Respiratory system: Rhonchi in the right middle lobe. Respiratory effort normal. Cardiovascular system: S1 & S2 heard, RRR. No JVD, murmurs, rubs, gallops or clicks. No pedal edema. Gastrointestinal system: Abdomen is nondistended, soft and nontender. No organomegaly or masses felt. Normal bowel sounds heard. Central nervous system: Alert and oriented.  Quadriplegic Skin: No rashes, lesions or ulcers.  Psychiatry: Judgement and insight appear normal. Mood & affect appropriate.   Data Reviewed: I have personally reviewed following labs and imaging studies  CBC: Recent Labs  Lab 08/14/20 1647 08/15/20 0539 08/16/20 1201 08/17/20 0634 08/18/20 0357 08/19/20 0418 08/20/20 0558  WBC 11.0*   < > 18.8* 13.8* 10.0 8.2 11.7*  NEUTROABS 8.1*  --   --   --   --  5.4 9.2*  HGB 8.7*   < > 8.5* 8.0* 8.4* 8.8* 8.7*  HCT 27.1*   < > 26.3* 25.4* 25.7* 28.8* 28.3*  MCV 88.9   < > 87.7 88.8 87.7 91.4 90.1  PLT 318   < > 292 293 294 299 288   < > = values in this interval not displayed.   Basic Metabolic Panel: Recent Labs  Lab 08/15/20 0539 08/15/20 0539 08/16/20 1201 08/17/20 0634 08/18/20 0357 08/19/20 0418 08/20/20 0558   NA 139   < > 140 141 144 145 149*  K 3.9   < > 3.9 3.8 3.5 3.7 3.4*  CL 101   < > 102 104 104 106 108  CO2 29   < > 29 28 32 31 29  GLUCOSE 101*   < > 137* 198* 134* 116* 151*  BUN 18   < > 18 20 24* 25* 29*  CREATININE 0.60*   < > 0.79 0.70 0.55* 0.62 0.66  CALCIUM 9.4   < > 9.4 9.2 8.9 9.4 9.8  MG 2.1  --  2.1 2.2 2.1  --   --   PHOS 3.5  --   --   --   --   --   --    < > = values in this interval not displayed.   GFR: Estimated Creatinine Clearance: 123.4 mL/min (by C-G formula based on SCr of 0.66 mg/dL). Liver Function Tests: Recent Labs  Lab 08/14/20 1647 08/15/20 0539  08/19/20 0418 08/20/20 0558  AST 21 20 28 20   ALT 31 28 49* 38  ALKPHOS 130* 112 125 121  BILITOT 0.5 0.7 0.5 0.7  PROT 7.4 7.0 7.5 7.9  ALBUMIN 2.6* 2.3* 2.5* 2.5*   No results for input(s): LIPASE, AMYLASE in the last 168 hours. No results for input(s): AMMONIA in the last 168 hours. Coagulation Profile: Recent Labs  Lab 08/15/20 0539  INR 1.2   Cardiac Enzymes: No results for input(s): CKTOTAL, CKMB, CKMBINDEX, TROPONINI in the last 168 hours. BNP (last 3 results) No results for input(s): PROBNP in the last 8760 hours. HbA1C: No results for input(s): HGBA1C in the last 72 hours. CBG: No results for input(s): GLUCAP in the last 168 hours. Lipid Profile: No results for input(s): CHOL, HDL, LDLCALC, TRIG, CHOLHDL, LDLDIRECT in the last 72 hours. Thyroid Function Tests: No results for input(s): TSH, T4TOTAL, FREET4, T3FREE, THYROIDAB in the last 72 hours. Anemia Panel: No results for input(s): VITAMINB12, FOLATE, FERRITIN, TIBC, IRON, RETICCTPCT in the last 72 hours. Sepsis Labs: Recent Labs  Lab 08/14/20 1649 08/14/20 2224 08/15/20 0539  PROCALCITON  --   --  <0.10  LATICACIDVEN 1.2 1.5  --     Recent Results (from the past 240 hour(s))  Resp Panel by RT-PCR (Flu A&B, Covid) Nasopharyngeal Swab     Status: None   Collection Time: 08/14/20  4:51 PM   Specimen: Nasopharyngeal Swab;  Nasopharyngeal(NP) swabs in vial transport medium  Result Value Ref Range Status   SARS Coronavirus 2 by RT PCR NEGATIVE NEGATIVE Final    Comment: (NOTE) SARS-CoV-2 target nucleic acids are NOT DETECTED.  The SARS-CoV-2 RNA is generally detectable in upper respiratory specimens during the acute phase of infection. The lowest concentration of SARS-CoV-2 viral copies this assay can detect is 138 copies/mL. A negative result does not preclude SARS-Cov-2 infection and should not be used as the sole basis for treatment or other patient management decisions. A negative result may occur with  improper specimen collection/handling, submission of specimen other than nasopharyngeal swab, presence of viral mutation(s) within the areas targeted by this assay, and inadequate number of viral copies(<138 copies/mL). A negative result must be combined with clinical observations, patient history, and epidemiological information. The expected result is Negative.  Fact Sheet for Patients:  EntrepreneurPulse.com.au  Fact Sheet for Healthcare Providers:  IncredibleEmployment.be  This test is no t yet approved or cleared by the [place] FDA and  has been authorized for detection and/or diagnosis of SARS-CoV-2 by FDA under an Emergency Use Authorization (EUA). This EUA will remain  in effect (meaning this test can be used) for the duration of the COVID-19 declaration under Section 564(b)(1) of the Act, 21 U.S.C.section 360bbb-3(b)(1), unless the authorization is terminated  or revoked sooner.       Influenza A by PCR NEGATIVE NEGATIVE Final   Influenza B by PCR NEGATIVE NEGATIVE Final    Comment: (NOTE) The Xpert Xpress SARS-CoV-2/FLU/RSV plus assay is intended as an aid in the diagnosis of influenza from Nasopharyngeal swab specimens and should not be used as a sole basis for treatment. Nasal washings and aspirates are unacceptable for Xpert Xpress  SARS-CoV-2/FLU/RSV testing.  Fact Sheet for Patients: EntrepreneurPulse.com.au  Fact Sheet for Healthcare Providers: IncredibleEmployment.be  This test is not yet approved or cleared by the [place] FDA and has been authorized for detection and/or diagnosis of SARS-CoV-2 by FDA under an Emergency Use Authorization (EUA). This EUA will remain in effect (meaning this  test can be used) for the duration of the COVID-19 declaration under Section 564(b)(1) of the Act, 21 U.S.C. section 360bbb-3(b)(1), unless the authorization is terminated or revoked.  Performed at [facility], [address] [place], [place] [address]   Culture, blood (routine x 2)     Status: None   Collection Time: 08/14/20  6:44 PM   Specimen: BLOOD  Result Value Ref Range Status   Specimen Description BLOOD LEFT ANTECUBITAL  Final   Special Requests   Final    BOTTLES DRAWN AEROBIC AND ANAEROBIC Blood Culture results may not be optimal due to an excessive volume of blood received in culture bottles   Culture   Final    NO GROWTH 5 DAYS Performed at [facility], [address] [place], [place] [address]    Report Status 08/19/2020 FINAL  Final  Culture, blood (routine x 2)     Status: None   Collection Time: 08/14/20  6:45 PM   Specimen: BLOOD  Result Value Ref Range Status   Specimen Description BLOOD BLOOD RIGHT FOREARM  Final   Special Requests   Final    BOTTLES DRAWN AEROBIC AND ANAEROBIC Blood Culture adequate volume   Culture   Final    NO GROWTH 5 DAYS Performed at [facility], [address] [place], [place] [address]    Report Status 08/19/2020 FINAL  Final  Culture, respiratory     Status: None   Collection Time: 08/15/20  3:20 AM   Specimen: SPU  Result Value Ref Range Status   Specimen Description   Final    SPUTUM Performed at [facility], [address] [place], [place] [address]    Special Requests    Final    NONE Performed at [facility], [address] [place], [place] [address]    Gram Stain   Final    ABUNDANT WBC PRESENT,BOTH PMN AND MONONUCLEAR FEW GRAM POSITIVE RODS RARE GRAM POSITIVE COCCI IN PAIRS    Culture   Final    RARE Normal respiratory flora-no Staph aureus or Pseudomonas seen Performed at [facility], [place] [address], [place],  [address]    Report Status 08/17/2020 FINAL  Final  Aerobic/Anaerobic Culture (surgical/deep wound)     Status: None   Collection Time: 08/16/20  9:51 AM   Specimen: PATH Other; Tissue  Result Value Ref Range Status   Specimen Description TISSUE SACRAL  Final   Special Requests BONE  Final   Gram Stain   Final    RARE WBC PRESENT,BOTH PMN AND MONONUCLEAR NO ORGANISMS SEEN    Culture   Final    RARE ENTEROBACTER CLOACAE FEW STREPTOCOCCUS CONSTELLATUS FEW BACTEROIDES THETAIOTAOMICRON BETA LACTAMASE POSITIVE Performed at [facility], [place] [address] [place],  [address]    Report Status 08/19/2020 FINAL  Final   Organism ID, Bacteria ENTEROBACTER CLOACAE  Final      Susceptibility   Enterobacter cloacae - MIC*    CEFAZOLIN >=64 RESISTANT Resistant     CEFEPIME <=0.12 SENSITIVE Sensitive     CEFTAZIDIME <=1 SENSITIVE Sensitive     CIPROFLOXACIN <=0.25 SENSITIVE Sensitive     GENTAMICIN <=1 SENSITIVE Sensitive     IMIPENEM 0.5 SENSITIVE Sensitive     TRIMETH/SULFA <=20 SENSITIVE Sensitive     PIP/TAZO <=4 SENSITIVE Sensitive     * RARE ENTEROBACTER CLOACAE  Aerobic/Anaerobic Culture (surgical/deep wound)     Status: Abnormal   Collection Time:  08/16/20  9:52 AM   Specimen: PATH Other; Tissue  Result Value Ref Range Status   Specimen Description   Final    ABSCESS Performed at [facility], [address], [place], [place] [address]    Special Requests   Final    SACRAL DECUBITUS ULCER Performed at [facility], [address] [place], [place] [address]    Gram  Stain NO WBC SEEN NO ORGANISMS SEEN NO ORGANISMS SEEN   Final   Culture (A)  Final    MULTIPLE ORGANISMS PRESENT, NONE PREDOMINANT FEW BACTEROIDES THETAIOTAOMICRON BETA LACTAMASE POSITIVE Performed at [facility], [address] [place], [place] [address]    Report Status 08/19/2020 FINAL  Final      Radiology Studies: DG Chest Port 1 View  Result Date: 08/18/2020 CLINICAL DATA:  Ronchi at both lung bases. EXAM: PORTABLE CHEST 1 VIEW COMPARISON:  Chest radiograph 08/14/2020. FINDINGS: Unchanged position of a tracheostomy tube which terminates at the level of the clavicular heads. Heart size within normal limits. Shallow inspiration radiograph. Significant interval improvement in aeration of the right lung base with only subtle persistent opacity within the medial right lower lobe. Unchanged minimal opacity within the lateral left lung base with an appearance most suggestive of atelectasis. No sizable pleural effusion or evidence of pneumothorax. No acute bony abnormality identified. Fusion hardware within the visualized cervical spine. IMPRESSION: Shallow inspiration radiograph. Significant interval improvement in aeration of the right lung base with only subtle persistent opacity in the medial right lower lobe. Findings may reflect improving atelectasis and/or pneumonia. Unchanged subtle opacity within the lateral left lung base with an appearance most suggestive of atelectasis. Electronically Signed   By: Kellie Simmering DO   On: 08/18/2020 11:27     Scheduled Meds: . bisacodyl  10 mg Rectal QHS  . Chlorhexidine Gluconate Cloth  6 each Topical Daily  . famotidine  20 mg Per Tube Daily  . feeding supplement (OSMOLITE 1.5 CAL)  237 mL Per Tube Q3H  . feeding supplement (PROSource TF)  45 mL Per Tube BID  . ferrous sulfate  78 mg Oral Q breakfast  . fludrocortisone  0.2 mg Per Tube Daily  . FLUoxetine  40 mg Per Tube Daily  . free water  300 mL Per Tube Q4H  . influenza vaccine  adjuvanted  0.5 mL Intramuscular Tomorrow-1000  . midodrine  10 mg Per Tube TID WC  . nutrition supplement (JUVEN)  1 packet Per Tube BID BM  . rivaroxaban  10 mg Per Tube Daily   Continuous Infusions: . piperacillin-tazobactam (ZOSYN)  IV 3.375 g (08/20/20 0418)  . potassium chloride       LOS: 6 days   Darliss Cheney, MD [facility] If 7PM-7AM, please contact night-coverage 08/20/2020, 11:08 AM

## 2020-08-20 NOTE — Plan of Care (Signed)
  Problem: Clinical Measurements: Goal: Ability to maintain clinical measurements within normal limits will improve Outcome: Progressing Goal: Will remain free from infection Outcome: Progressing Goal: Diagnostic test results will improve Outcome: Progressing Goal: Respiratory complications will improve Outcome: Progressing Goal: Cardiovascular complication will be avoided Outcome: Progressing   Problem: Nutrition: Goal: Adequate nutrition will be maintained Outcome: Progressing   Problem: Coping: Goal: Level of anxiety will decrease Outcome: Progressing   Problem: Elimination: Goal: Will not experience complications related to bowel motility Outcome: Progressing Goal: Will not experience complications related to urinary retention Outcome: Progressing   

## 2020-08-20 NOTE — Plan of Care (Signed)
  Problem: Clinical Measurements: Goal: Will remain free from infection Outcome: Progressing Goal: Diagnostic test results will improve Outcome: Progressing Goal: Respiratory complications will improve Outcome: Progressing Goal: Cardiovascular complication will be avoided Outcome: Progressing   Problem: Nutrition: Goal: Adequate nutrition will be maintained Outcome: Progressing   

## 2020-08-21 ENCOUNTER — Inpatient Hospital Stay: Payer: Self-pay

## 2020-08-21 DIAGNOSIS — L89154 Pressure ulcer of sacral region, stage 4: Secondary | ICD-10-CM | POA: Diagnosis not present

## 2020-08-21 DIAGNOSIS — J690 Pneumonitis due to inhalation of food and vomit: Secondary | ICD-10-CM | POA: Diagnosis not present

## 2020-08-21 DIAGNOSIS — M4628 Osteomyelitis of vertebra, sacral and sacrococcygeal region: Secondary | ICD-10-CM | POA: Diagnosis not present

## 2020-08-21 LAB — BASIC METABOLIC PANEL
Anion gap: 9 (ref 5–15)
BUN: 27 mg/dL — ABNORMAL HIGH (ref 8–23)
CO2: 30 mmol/L (ref 22–32)
Calcium: 9.6 mg/dL (ref 8.9–10.3)
Chloride: 111 mmol/L (ref 98–111)
Creatinine, Ser: 0.63 mg/dL (ref 0.61–1.24)
GFR, Estimated: >60 mL/min (ref >60)
Glucose, Bld: 110 mg/dL — ABNORMAL HIGH (ref 70–99)
Potassium: 3.2 mmol/L — ABNORMAL LOW (ref 3.5–5.1)
Sodium: 150 mmol/L — ABNORMAL HIGH (ref 135–145)

## 2020-08-21 LAB — CBC WITH DIFFERENTIAL/PLATELET
Abs Immature Granulocytes: 0.05 10*3/uL (ref 0.00–0.07)
Basophils Absolute: 0 10*3/uL (ref 0.0–0.1)
Basophils Relative: 0 %
Eosinophils Absolute: 0.3 10*3/uL (ref 0.0–0.5)
Eosinophils Relative: 2 %
HCT: 28 % — ABNORMAL LOW (ref 39.0–52.0)
Hemoglobin: 8.7 g/dL — ABNORMAL LOW (ref 13.0–17.0)
Immature Granulocytes: 0 %
Lymphocytes Relative: 11 %
Lymphs Abs: 1.3 10*3/uL (ref 0.7–4.0)
MCH: 28.2 pg (ref 26.0–34.0)
MCHC: 31.1 g/dL (ref 30.0–36.0)
MCV: 90.9 fL (ref 80.0–100.0)
Monocytes Absolute: 0.9 10*3/uL (ref 0.1–1.0)
Monocytes Relative: 8 %
Neutro Abs: 9.3 10*3/uL — ABNORMAL HIGH (ref 1.7–7.7)
Neutrophils Relative %: 79 %
Platelets: 248 10*3/uL (ref 150–400)
RBC: 3.08 MIL/uL — ABNORMAL LOW (ref 4.22–5.81)
RDW: 18.6 % — ABNORMAL HIGH (ref 11.5–15.5)
WBC: 11.8 10*3/uL — ABNORMAL HIGH (ref 4.0–10.5)
nRBC: 0 % (ref 0.0–0.2)

## 2020-08-21 MED ORDER — FREE WATER
200.0000 mL | Freq: Three times a day (TID) | Status: DC
  Administered 2020-08-21 – 2020-08-25 (×10): 200 mL

## 2020-08-21 MED ORDER — POTASSIUM CHLORIDE 10 MEQ/100ML IV SOLN
10.0000 meq | INTRAVENOUS | Status: AC
  Administered 2020-08-21 (×5): 10 meq via INTRAVENOUS
  Filled 2020-08-21: qty 100

## 2020-08-21 MED ORDER — SODIUM CHLORIDE 0.9% FLUSH
10.0000 mL | Freq: Two times a day (BID) | INTRAVENOUS | Status: DC
  Administered 2020-08-21 – 2020-08-24 (×5): 10 mL

## 2020-08-21 MED ORDER — SODIUM CHLORIDE 0.9% FLUSH: 10.0000 mL | INTRAVENOUS | Status: DC | PRN

## 2020-08-21 NOTE — Progress Notes (Addendum)
[facility] Day(s): 7.   Post op day(s): 5 Days Post-Op.   Interval History:  Patient seen and examined no acute events or new complaints overnight.  Patient reports he is doing okay, no issues No fevers Persistent, but mild, leukocytosis with WBC 11.8K (11.7k yesterday) Renal function remains normal, sCr - 0.63 Mild hypokalemia to 3.2 Bone biopsy from OR on 11/24 with acute osteomyelitis; ID following; recommending 6 weeks IV Abx Continue with wound vac; plan for change today; he will benefit from this at home  Vital signs in last 24 hours: [min-max] current  Temp:  [98.2 F (36.8 C)-98.8 F (37.1 C)] 98.6 F (37 C) (11/29 0828) Pulse Rate:  [67-79] 79 (11/29 0828) Resp:  [16-20] 18 (11/29 0828) BP: (107-135)/(57-69) 110/69 (11/29 0828) SpO2:  [98 %-100 %] 98 % (11/29 0828) FiO2 (%):  [28 %] 28 % (11/28 1957)     Height: 6\' 5"  (195.6 cm) Weight: 113.1 kg BMI (Calculated): 29.56   Intake/Output last 2 shifts:  11/28 0701 - 11/29 0700 In: 1051 [NG/GT:711; IV Piggyback:100] Out: 3662 [Urine:1625; Drains:100]   Physical Exam:  Constitutional: alert, cooperative and no distress  Respiratory: s/p tracheostomy, on ventilator Cardiovascular: regular rate and sinus rhythm  Integumentary: Wound vac to sacrum, no leak   Labs:  CBC Latest Ref Rng & Units 08/21/2020 08/20/2020 08/19/2020  WBC 4.0 - 10.5 K/uL 11.8(H) 11.7(H) 8.2  Hemoglobin 13.0 - 17.0 g/dL 8.7(L) 8.7(L) 8.8(L)  Hematocrit 39 - 52 % 28.0(L) 28.3(L) 28.8(L)  Platelets 150 - 400 K/uL 248 288 299   CMP Latest Ref Rng & Units 08/21/2020 08/20/2020 08/19/2020  Glucose 70 - 99 mg/dL 110(H) 151(H) 116(H)  BUN 8 - 23 mg/dL 27(H) 29(H) 25(H)  Creatinine 0.61 - 1.24 mg/dL 0.63 0.66 0.62  Sodium 135 - 145 mmol/L 150(H) 149(H) 145  Potassium 3.5 - 5.1 mmol/L 3.2(L) 3.4(L) 3.7  Chloride 98 - 111 mmol/L 111 108 106  CO2 22 - 32 mmol/L 30 29 31   Calcium 8.9 - 10.3 mg/dL 9.6  9.8 9.4  Total Protein 6.5 - 8.1 g/dL - 7.9 7.5  Total Bilirubin 0.3 - 1.2 mg/dL - 0.7 0.5  Alkaline Phos 38 - 126 U/L - 121 125  AST 15 - 41 U/L - 20 28  ALT 0 - 44 U/L - 38 49(H)     Imaging studies: No new pertinent imaging studies   Assessment/Plan: 69 y.o. male 5 Days Post-Op s/p Debridement of skin, subcutaneous tissue, and muscle (48 cm); deep bone biopsy; application of wound VAC (8 x 6 x 3 cm) for infected stage IV sacral decubitus ulcer with underlying osteomyelitis, complicated by pertinent comorbidities including including history of fall resulting in cervical SCI and subsequent quadriplegia.   - Continue wound vac MWF; okay to transition to black foam for home; he will need to establish home dressing changes   - Continue IV Abx (Zosyn); ID following; home recommendation per ID   - Continue local wound care; pressure off loading; low air loss mattress    - Further management per primary service   - I will be available to evaluate wound together with ID and WOC when needed. If wound looks good without need for additional debridement we will sign off with recommendations as above.   All of the above findings and recommendations were discussed with the patient, and the medical team, and all of patient's questions were answered to his expressed satisfaction.  -- Edison Simon,  PA-C [facility] 08/21/2020, 9:51 AM [phone] M-F: 7am - 4pm  I agree with the above documentation, exam, and plan, which I have edited where appropriate. Fredirick Maudlin  10:49 AM  Addendum: dressing change witnessed by PA today. Wound is clean and requires no further surgical intervention.   General Surgery will sign off.

## 2020-08-21 NOTE — Progress Notes (Signed)
RN spoke with patient's wife at beginning of shift. She is requesting that patient not be given tube feedings or free water after 9pm because he becomes bloated and uncomfortable. It is not part of their daily routine at home. RN will relay message to day shift nurse to pass on to attending provider.

## 2020-08-21 NOTE — Consult Note (Signed)
WOC Nurse Consult Note: Reason for Consult:stage 4 pressure injury   MD and PA  at bedside to evaluate. Wound type:stage 4 pressure injury Pressure Injury POA: Yes Measurement: 4 cm x 3 cm x 3.5 cm with undermining present from 9 to 3 o'clock Wound LTV:TWYSO red   Drainage (amount, consistency, odor) minimal blood tinged serosanguinous  No odor. Patient had loose stools x 4 while performing care today.  MD aware.  Periwound:scarring  Dressing procedure/placement/frequency:COntinue NPWT with black foam. 2 pieces to wound bed and 1 piece to bridge to left trochanter. CHange MOn/Wed/Fri.  Will follow.   Domenic Moras MSN, RN, FNP-BC CWON Wound, Ostomy, Continence Nurse Pager [phone]

## 2020-08-21 NOTE — Progress Notes (Signed)
PROGRESS NOTE    Ryan Day  XAJ:[id number] DOB: [birth year] DOA: 08/14/2020 PCP: Valerie Roys, DO   Brief hospital summary:  Ryan Day is a 69 y.o. 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  Patient also have a sacral bedsore which has been concerning to patient's wife due to malodorous and draining pus. S/p OR large debridement by general surgery on 08/16/2020.  Now has wound VAC.  Patient remains on IV vancomycin and cefepime.  All the blood cultures are negative so far.  Wound culture is growing enterococci.  ID consulted.  Recommend 6 weeks of IV Zosyn.  General surgery and ID to look at the wound on Monday and do the dressing changes and then provide further recommendations   Assessment & Plan:   Active Problems:   Hyperlipidemia   Elevated alkaline phosphatase level   Spinal cord injury, cervical region, sequela (HCC)   Quadriplegia (HCC)   Neurogenic orthostatic hypotension (HCC)   Sacral decubitus ulcer, stage II (HCC)   Aspiration pneumonia (HCC)   Pneumonia   Hypokalemia   Hypoalbuminemia   Leukocytosis   Decubitus ulcer of sacral region, stage 4 (HCC)      Aspiration pneumonia: --Chest x-ray showed partial consolidation at the right base, which has been present since last hospitalization.  But also suggested possible pneumonia.  No fever but some leukocytosis and has rhonchi on examination today.  Repeat x-ray shows some improvement.    Increased airway secretion Hx of mucus plugging  --Wife reported difficulty suctioning secretion at home due to secretion being too thick.  Pt has been on  scopolamine patch for a while.  RT, however, noted thin copious amount of white secretion. --scopolamine patch d/c'ed on 11/23 PLAN: --Robinul 1 mg TID PRN for increased secretion --pulm hygiene with RT --trach suctioning per RT  Sepsis 2/2 Large left sacral decubitus ulcer Left sacral osteomyelitis Malodorous purulent drainage noted on sacral decubitus ulcer by wife.  S/p OR large debridement by general surgery on 08/16/2020.  Now has wound VAC.  Patient remains on IV vancomycin and cefepime.  All the blood cultures are negative so far.  Wound culture is growing Enterobacter clcoacae. ID consulted.  Recommend 6 weeks of IV Zosyn.  General surgery and ID to look at the wound together along with WOCN nurse and did remind whether patient needs any further debridement or not.,  If not then patient will need home health and wound VAC arranged before he can be discharged.  TOC has been informed.  Potential discharge tomorrow.  Hypokalemia 3.2.  Replace.  Recheck in the morning.  PEG-dependent --wife reported insurance stopped covering for osmolite and pt needs cheaper tube feed option PLAN: --resume tube feed after surgery --TOC to look into insurance coverage of tube feed at home  6.  Hypotension -Blood pressure stable-cont home midodrine and Florinef  7.  Spinal cord injury, cervical region with subsequent quadriplegia Patient had C5 vertebral body fracture, C3-C6 cord compression with edema status post C3-C6 laminectomy with fusion and repair of the dural tear in 04/2020 at [facility]  8.  GERD Continue famotidine per home regimen  10.  Depression --cont home prozac  11.  Anemia, iron def Hemoglobin stable at 8's --anemia workup showed only iron def --cont home iron suppl   DVT prophylaxis: [id number] as DVT ppx Code Status: Full code confirmed by outpatient palliative Family Communication:  Status is: inpatient Dispo:   The patient is from: home Anticipated d/c is to:  home Anticipated d/c date is: Likely either later today or tomorrow based on further recommendations from general surgery and ID. Patient currently is not medically stable to d/c due to: infected sacral decub ulcer that needs further evaluation.   Subjective and Interval History:  Seen and examined.  Wife not at the bedside.  Patient is feeling good without having any complaint.  Denied any shortness of breath.   Objective: Vitals:   08/20/20 1534 08/20/20 1957 08/21/20 0341 08/21/20 0828  BP: 135/68  (!) 107/57 110/69  Pulse: 79  75 79  Resp: 20  16 18   Temp: 98.8 F (37.1 C)  98.4 F (36.9 C) 98.6 F (37 C)  TempSrc:    Oral  SpO2: 100% 99% 100% 98%  Weight:      Height:        Intake/Output Summary (Last 24 hours) at 08/21/2020 1054 Last data filed at 08/21/2020 0341 Gross per 24 hour  Intake 694 ml  Output 1725 ml  Net -1031 ml   Filed Weights   08/15/20 0505  Weight: 113.1 kg    Examination:   General exam: Appears calm and comfortable with trach and PEG Respiratory system: Rhonchi bilaterally. Respiratory effort normal. Cardiovascular system: S1 & S2 heard, RRR. No JVD, murmurs, rubs, gallops or clicks. No pedal edema. Gastrointestinal system: Abdomen is nondistended, soft and nontender. No organomegaly or masses felt. Normal bowel sounds heard. Central nervous system: Alert and oriented.  Quadriplegic Skin: Dressing at sacral area. Psychiatry: Judgement and insight appear normal. Mood & affect appropriate.   Data Reviewed: I have personally reviewed following labs and imaging studies  CBC: Recent Labs  Lab 08/14/20 1647 08/15/20 0539 08/17/20 0634 08/18/20 0357 08/19/20 0418 08/20/20 0558 08/21/20 0510  WBC 11.0*   < > 13.8* 10.0 8.2 11.7* 11.8*  NEUTROABS 8.1*  --   --   --  5.4 9.2* 9.3*  HGB 8.7*   < > 8.0* 8.4* 8.8* 8.7* 8.7*  HCT 27.1*   < > 25.4* 25.7* 28.8* 28.3* 28.0*  MCV 88.9   < > 88.8 87.7 91.4 90.1 90.9  PLT 318   < > 293 294 299 288  248   < > = values in this interval not displayed.   Basic Metabolic Panel: Recent Labs  Lab 08/15/20 0539 08/15/20 0539 08/16/20 1201 08/16/20 1201 08/17/20 0634 08/18/20 0357 08/19/20 0418 08/20/20 0558 08/21/20 0510  NA 139   < > 140   < > 141 144 145 149* 150*  K 3.9   < > 3.9   < > 3.8 3.5 3.7 3.4* 3.2*  CL 101   < > 102   < > 104 104 106 108 111  CO2 29   < > 29   < > 28 32 31 29 30   GLUCOSE 101*   < > 137*   < > 198* 134* 116* 151* 110*  BUN 18   < > 18   < > 20 24* 25* 29* 27*  CREATININE 0.60*   < > 0.79   < > 0.70 0.55* 0.62 0.66 0.63  CALCIUM 9.4   < > 9.4   < > 9.2 8.9  9.4 9.8 9.6  MG 2.1  --  2.1  --  2.2 2.1  --   --   --   PHOS 3.5  --   --   --   --   --   --   --   --    < > = values in this interval not displayed.   GFR: Estimated Creatinine Clearance: 123.4 mL/min (by C-G formula based on SCr of 0.63 mg/dL). Liver Function Tests: Recent Labs  Lab 08/14/20 1647 08/15/20 0539 08/19/20 0418 08/20/20 0558  AST 21 20 28 20   ALT 31 28 49* 38  ALKPHOS 130* 112 125 121  BILITOT 0.5 0.7 0.5 0.7  PROT 7.4 7.0 7.5 7.9  ALBUMIN 2.6* 2.3* 2.5* 2.5*   No results for input(s): LIPASE, AMYLASE in the last 168 hours. No results for input(s): AMMONIA in the last 168 hours. Coagulation Profile: Recent Labs  Lab 08/15/20 0539  INR 1.2   Cardiac Enzymes: No results for input(s): CKTOTAL, CKMB, CKMBINDEX, TROPONINI in the last 168 hours. BNP (last 3 results) No results for input(s): PROBNP in the last 8760 hours. HbA1C: No results for input(s): HGBA1C in the last 72 hours. CBG: No results for input(s): GLUCAP in the last 168 hours. Lipid Profile: No results for input(s): CHOL, HDL, LDLCALC, TRIG, CHOLHDL, LDLDIRECT in the last 72 hours. Thyroid Function Tests: No results for input(s): TSH, T4TOTAL, FREET4, T3FREE, THYROIDAB in the last 72 hours. Anemia Panel: No results for input(s): VITAMINB12, FOLATE, FERRITIN, TIBC, IRON, RETICCTPCT in the last 72  hours. Sepsis Labs: Recent Labs  Lab 08/14/20 1649 08/14/20 2224 08/15/20 0539  PROCALCITON  --   --  <0.10  LATICACIDVEN 1.2 1.5  --     Recent Results (from the past 240 hour(s))  Resp Panel by RT-PCR (Flu A&B, Covid) Nasopharyngeal Swab     Status: None   Collection Time: 08/14/20  4:51 PM   Specimen: Nasopharyngeal Swab; Nasopharyngeal(NP) swabs in vial transport medium  Result Value Ref Range Status   SARS Coronavirus 2 by RT PCR NEGATIVE NEGATIVE Final    Comment: (NOTE) SARS-CoV-2 target nucleic acids are NOT DETECTED.  The SARS-CoV-2 RNA is generally detectable in upper respiratory specimens during the acute phase of infection. The lowest concentration of SARS-CoV-2 viral copies this assay can detect is 138 copies/mL. A negative result does not preclude SARS-Cov-2 infection and should not be used as the sole basis for treatment or other patient management decisions. A negative result may occur with  improper specimen collection/handling, submission of specimen other than nasopharyngeal swab, presence of viral mutation(s) within the areas targeted by this assay, and inadequate number of viral copies(<138 copies/mL). A negative result must be combined with clinical observations, patient history, and epidemiological information. The expected result is Negative.  Fact Sheet for Patients:  EntrepreneurPulse.com.au  Fact Sheet for Healthcare Providers:  IncredibleEmployment.be  This test is no t yet approved or cleared by the [place] FDA and  has been authorized for detection and/or diagnosis of SARS-CoV-2 by FDA under an Emergency Use Authorization (EUA). This EUA will remain  in effect (meaning this test can be used) for the duration of the COVID-19 declaration under Section 564(b)(1) of the Act, 21 U.S.C.section 360bbb-3(b)(1), unless the authorization is terminated  or revoked sooner.       Influenza A by PCR NEGATIVE  NEGATIVE Final   Influenza B by PCR NEGATIVE NEGATIVE Final    Comment: (NOTE) The Xpert Xpress SARS-CoV-2/FLU/RSV plus  assay is intended as an aid in the diagnosis of influenza from Nasopharyngeal swab specimens and should not be used as a sole basis for treatment. Nasal washings and aspirates are unacceptable for Xpert Xpress SARS-CoV-2/FLU/RSV testing.  Fact Sheet for Patients: EntrepreneurPulse.com.au  Fact Sheet for Healthcare Providers: IncredibleEmployment.be  This test is not yet approved or cleared by the [place] FDA and has been authorized for detection and/or diagnosis of SARS-CoV-2 by FDA under an Emergency Use Authorization (EUA). This EUA will remain in effect (meaning this test can be used) for the duration of the COVID-19 declaration under Section 564(b)(1) of the Act, 21 U.S.C. section 360bbb-3(b)(1), unless the authorization is terminated or revoked.  Performed at [facility], [address] [place], [place] [address]   Culture, blood (routine x 2)     Status: None   Collection Time: 08/14/20  6:44 PM   Specimen: BLOOD  Result Value Ref Range Status   Specimen Description BLOOD LEFT ANTECUBITAL  Final   Special Requests   Final    BOTTLES DRAWN AEROBIC AND ANAEROBIC Blood Culture results may not be optimal due to an excessive volume of blood received in culture bottles   Culture   Final    NO GROWTH 5 DAYS Performed at [facility], [address], [place], [place] [address]    Report Status 08/19/2020 FINAL  Final  Culture, blood (routine x 2)     Status: None   Collection Time: 08/14/20  6:45 PM   Specimen: BLOOD  Result Value Ref Range Status   Specimen Description BLOOD BLOOD RIGHT FOREARM  Final   Special Requests   Final    BOTTLES DRAWN AEROBIC AND ANAEROBIC Blood Culture adequate volume   Culture   Final    NO GROWTH 5 DAYS Performed at [facility], [name]  [place], [place] [address]    Report Status 08/19/2020 FINAL  Final  Culture, respiratory     Status: None   Collection Time: 08/15/20  3:20 AM   Specimen: SPU  Result Value Ref Range Status   Specimen Description   Final    SPUTUM Performed at [facility], [address], [place], [place] [address]    Special Requests   Final    NONE Performed at [facility], [address] [place], [place] [address]    Gram Stain   Final    ABUNDANT WBC PRESENT,BOTH PMN AND MONONUCLEAR FEW GRAM POSITIVE RODS RARE GRAM POSITIVE COCCI IN PAIRS    Culture   Final    RARE Normal respiratory flora-no Staph aureus or Pseudomonas seen Performed at [facility], [place] [address] [place], [place] [address]    Report Status 08/17/2020 FINAL  Final  Aerobic/Anaerobic Culture (surgical/deep wound)     Status: None   Collection Time: 08/16/20  9:51 AM   Specimen: PATH Other; Tissue  Result Value Ref Range Status   Specimen Description TISSUE SACRAL  Final   Special Requests BONE  Final   Gram Stain   Final    RARE WBC PRESENT,BOTH PMN AND MONONUCLEAR NO ORGANISMS SEEN    Culture   Final    RARE ENTEROBACTER CLOACAE FEW STREPTOCOCCUS CONSTELLATUS FEW BACTEROIDES THETAIOTAOMICRON BETA LACTAMASE POSITIVE Performed at [facility], [place] [address] [place], [place] [address]    Report Status 08/19/2020 FINAL  Final   Organism ID, Bacteria ENTEROBACTER CLOACAE  Final      Susceptibility  Enterobacter cloacae - MIC*    CEFAZOLIN >=64 RESISTANT Resistant     CEFEPIME <=0.12 SENSITIVE Sensitive     CEFTAZIDIME <=1 SENSITIVE Sensitive     CIPROFLOXACIN <=0.25 SENSITIVE Sensitive     GENTAMICIN <=1 SENSITIVE Sensitive     IMIPENEM 0.5 SENSITIVE Sensitive     TRIMETH/SULFA <=20 SENSITIVE Sensitive     PIP/TAZO <=4 SENSITIVE Sensitive     * RARE ENTEROBACTER CLOACAE  Aerobic/Anaerobic Culture (surgical/deep wound)     Status: Abnormal   Collection Time: 08/16/20  9:52 AM     Specimen: PATH Other; Tissue  Result Value Ref Range Status   Specimen Description   Final    ABSCESS Performed at [facility], [address] [place], [place] [address]    Special Requests   Final    SACRAL DECUBITUS ULCER Performed at [facility], [place], [place] [address]    Gram Stain NO WBC SEEN NO ORGANISMS SEEN NO ORGANISMS SEEN   Final   Culture (A)  Final    MULTIPLE ORGANISMS PRESENT, NONE PREDOMINANT FEW BACTEROIDES THETAIOTAOMICRON BETA LACTAMASE POSITIVE Performed at [facility], [place] [address], [place], [place] [address]    Report Status 08/19/2020 FINAL  Final      Radiology Studies: No results found.   Scheduled Meds: . bisacodyl  10 mg Rectal QHS  . Chlorhexidine Gluconate Cloth  6 each Topical Daily  . famotidine  20 mg Per Tube Daily  . feeding supplement (OSMOLITE 1.5 CAL)  237 mL Per Tube Q3H  . feeding supplement (PROSource TF)  45 mL Per Tube BID  . ferrous sulfate  78 mg Oral Q breakfast  . fludrocortisone  0.2 mg Per Tube Daily  . FLUoxetine  40 mg Per Tube Daily  . free water  200 mL Per Tube Q8H  . free water  300 mL Per Tube Q4H  . influenza vaccine adjuvanted  0.5 mL Intramuscular Tomorrow-1000  . midodrine  10 mg Per Tube TID WC  . nutrition supplement (JUVEN)  1 packet Per Tube BID BM  . rivaroxaban  10 mg Per Tube Daily   Continuous Infusions: . piperacillin-tazobactam (ZOSYN)  IV 3.375 g (08/21/20 0544)  . potassium chloride 10 mEq (08/21/20 1038)     LOS: 7 days   Darliss Cheney, MD [facility] If 7PM-7AM, please contact night-coverage 08/21/2020, 10:54 AM

## 2020-08-21 NOTE — Progress Notes (Signed)
ID Doing better No specific compliants Has lot of tracheal secretions  Patient Vitals for the past 24 hrs:  BP Temp Temp src Pulse Resp SpO2  08/21/20 0828 110/69 98.6 F (37 C) Oral 79 18 98 %  08/21/20 0341 (!) 107/57 98.4 F (36.9 C) -- 75 16 100 %  08/20/20 1957 -- -- -- -- -- 99 %  08/20/20 1534 135/68 98.8 F (37.1 C) -- 79 20 100 %  08/20/20 1215 119/69 98.2 F (36.8 C) -- 67 20 98 %  awake and alert Chest b/kl air entry [id number] abd soft Peg Sacrum wound examined- clean stage IV 11/29   11/22   07/25/20    07/24/20   10/26      CBC Latest Ref Rng & Units 08/21/2020 08/20/2020 08/19/2020  WBC 4.0 - 10.5 K/uL 11.8(H) 11.7(H) 8.2  Hemoglobin 13.0 - 17.0 g/dL 8.7(L) 8.7(L) 8.8(L)  Hematocrit 39 - 52 % 28.0(L) 28.3(L) 28.8(L)  Platelets 150 - 400 K/uL 248 288 299   CMP Latest Ref Rng & Units 08/21/2020 08/20/2020 08/19/2020  Glucose 70 - 99 mg/dL 110(H) 151(H) 116(H)  BUN 8 - 23 mg/dL 27(H) 29(H) 25(H)  Creatinine 0.61 - 1.24 mg/dL 0.63 0.66 0.62  Sodium 135 - 145 mmol/L 150(H) 149(H) 145  Potassium 3.5 - 5.1 mmol/L 3.2(L) 3.4(L) 3.7  Chloride 98 - 111 mmol/L 111 108 106  CO2 22 - 32 mmol/L 30 29 31   Calcium 8.9 - 10.3 mg/dL 9.6 9.8 9.4  Total Protein 6.5 - 8.1 g/dL - 7.9 7.5  Total Bilirubin 0.3 - 1.2 mg/dL - 0.7 0.5  Alkaline Phos 38 - 126 U/L - 121 125  AST 15 - 41 U/L - 20 28  ALT 0 - 44 U/L - 38 49(H)    Patient with  cervical spine injury/fracture at c5  following a fall and was at [facility] in August and underwent decompressive surgery and arthrodesis and fusion c3-c6 on 04/26/20. He has quadriplegia,  Has tracheostomy, PEG and Foley.  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 Acute osteomyelitis of the sacral bone Enterobacter in  culture. Continue IV zosyn - for 6 weeks  Rt lower lobe pneumonia- mucus plugs, due to inadequate clearing of secretions, also risk for aspiration- improving with frequent suction  Anemia from all of the  above and prolonged hospitlaization  Discussed with patient and wife Will follow him as OP

## 2020-08-21 NOTE — Progress Notes (Signed)
Pharmacy Antibiotic Note  Ryan Day is a 69 y.o. male admitted on 08/14/2020 with pneumonia. Respiratory culture with GPR and GPC. Pharmacy has been consulted for Zosyn dosing. Patient was previously on Vancomycin - last dose was 11/26. Patient has medical history significant forhypotension, dyslipidemia, quadriplegics/pfall resulting in cervical spinal cord injury -August 2021 (admitted and treated at [facility] in [facility] for 2 months;rehab at [facility]), trach and PEG tube dependent. Patient also has Stage IV sacral decubitus s/p debridement 11/24; acute osteomyelitis of the sacral bone with enterobacter cloacae in culture resistant to cefazolin.    Plan:  Continue Zosyn 3.375g IV Q8H extended infusion  Monitor renal function and adjust dose as clinically indicated ID planning for 6 weeks of antibiotics  Height: 6\' 5"  (195.6 cm) Weight: 113.1 kg (249 lb 5.4 oz) IBW/kg (Calculated) : 89.1  Temp (24hrs), Avg:98.5 F (36.9 C), Min:98.2 F (36.8 C), Max:98.8 F (37.1 C)  Recent Labs  Lab 08/14/20 1649 08/14/20 2224 08/15/20 0539 08/17/20 0634 08/18/20 0357 08/18/20 1315 08/19/20 0418 08/20/20 0558 08/21/20 0510  WBC  --   --    < > 13.8* 10.0  --  8.2 11.7* 11.8*  CREATININE  --   --    < > 0.70 0.55*  --  0.62 0.66 0.63  LATICACIDVEN 1.2 1.5  --   --   --   --   --   --   --   VANCOTROUGH  --   --   --   --   --  15  --   --   --    < > = values in this interval not displayed.    Estimated Creatinine Clearance: 123.4 mL/min (by C-G formula based on SCr of 0.63 mg/dL).    No Known Allergies  Antimicrobials this admission: 11/22 Vancomycin>> 11/26 11/22 Cefepime >> 11/23 11/24 Zosyn >>    Microbiology results:  11/22 BCx: NG  11/24 WCx (bone): Enterobacter cloacae   11/23 Resp. Cx: GPR, GPC  Thank you for allowing pharmacy to be a part of this patient's care.  Sherilyn Banker, PharmD Pharmacy Resident  08/21/2020 10:50 AM

## 2020-08-21 NOTE — Progress Notes (Signed)
PHARMACY CONSULT NOTE FOR:  OUTPATIENT  PARENTERAL ANTIBIOTIC THERAPY (OPAT)  Indication: sacral decubitus and osteomyelitis Regimen: piperacillin/tazobactam 13.5gm over 24hr as continuous infusion End date: 09/26/2020  IV antibiotic discharge orders are pended. To discharging provider:  please sign these orders via discharge navigator,  Select New Orders & click on the button choice - Manage This Unsigned Work.     Thank you for allowing pharmacy to be a part of this patient's care.  Doreene Eland, PharmD, BCPS.   Work Cell: [phone] 08/21/2020 1:03 PM

## 2020-08-21 NOTE — Progress Notes (Signed)
Peripherally Inserted Central Catheter Placement  The IV Nurse has discussed with the patient and/or persons authorized to consent for the patient, the purpose of this procedure and the potential benefits and risks involved with this procedure.  The benefits include less needle sticks, lab draws from the catheter, and the patient may be discharged home with the catheter. Risks include, but not limited to, infection, bleeding, blood clot (thrombus formation), and puncture of an artery; nerve damage and irregular heartbeat and possibility to perform a PICC exchange if needed/ordered by physician.  Alternatives to this procedure were also discussed.  Bard Power PICC patient education guide, fact sheet on infection prevention and patient information card has been provided to patient /or left at bedside.  Phone consent obtain from wife Ryan Day  [facility] Placement Documentation  PICC Single Lumen 08/21/20 PICC Right Brachial 45 cm 0 cm (Active)  Indication for Insertion or Continuance of Line Home intravenous therapies (PICC only) 08/21/20 1805  Exposed Catheter (cm) 0 cm 08/21/20 1805  Site Assessment Clean;Dry;Intact 08/21/20 1805  Line Status Blood return noted;Flushed;Saline locked 08/21/20 1805  Dressing Type Transparent 08/21/20 1805  Dressing Status Clean;Dry;Intact 08/21/20 1805  Antimicrobial disc in place? Yes 08/21/20 1805  Dressing Intervention New dressing 08/21/20 9038  Dressing Change Due 08/28/20 08/21/20 1805       [name] 08/21/2020, 6:07 PM

## 2020-08-21 NOTE — Treatment Plan (Signed)
Diagnosis: Sacral decubitus and osteomyelitis Baseline Creatinine < 1   No Known Allergies  OPAT Orders Discharge antibiotics: Zosyn 3.375 grams Iv every 8- 6hrs can be given as a continuous infusion  End Date: 09/26/2020  [facility] Care Per Protocol:  Labs weekly Monday while on IV antibiotics: _X_ CBC with differential  _X_ CMP Every 2 weeks  _X_ ESR   _X_ Please pull PIC at completion of IV antibiotics  Fax weekly labs to [place] [phone]  Clinic Follow Up Appt:09/14/20   Call [phone] with any questions

## 2020-08-22 DIAGNOSIS — J690 Pneumonitis due to inhalation of food and vomit: Secondary | ICD-10-CM | POA: Diagnosis not present

## 2020-08-22 LAB — BASIC METABOLIC PANEL
Anion gap: 9 (ref 5–15)
Anion gap: 8 (ref 5–15)
BUN: 26 mg/dL — ABNORMAL HIGH (ref 8–23)
BUN: 24 mg/dL — ABNORMAL HIGH (ref 8–23)
CO2: 30 mmol/L (ref 22–32)
CO2: 27 mmol/L (ref 22–32)
Calcium: 9.6 mg/dL (ref 8.9–10.3)
Calcium: 9.5 mg/dL (ref 8.9–10.3)
Chloride: 115 mmol/L — ABNORMAL HIGH (ref 98–111)
Chloride: 119 mmol/L — ABNORMAL HIGH (ref 98–111)
Creatinine, Ser: 0.59 mg/dL — ABNORMAL LOW (ref 0.61–1.24)
Creatinine, Ser: 0.5 mg/dL — ABNORMAL LOW (ref 0.61–1.24)
GFR, Estimated: >60 mL/min (ref >60)
GFR, Estimated: >60 mL/min (ref >60)
Glucose, Bld: 174 mg/dL — ABNORMAL HIGH (ref 70–99)
Glucose, Bld: 164 mg/dL — ABNORMAL HIGH (ref 70–99)
Potassium: 3.1 mmol/L — ABNORMAL LOW (ref 3.5–5.1)
Potassium: 3.8 mmol/L (ref 3.5–5.1)
Sodium: 154 mmol/L — ABNORMAL HIGH (ref 135–145)
Sodium: 154 mmol/L — ABNORMAL HIGH (ref 135–145)

## 2020-08-22 LAB — CBC WITH DIFFERENTIAL/PLATELET
Abs Immature Granulocytes: 0.04 10*3/uL (ref 0.00–0.07)
Basophils Absolute: 0 10*3/uL (ref 0.0–0.1)
Basophils Relative: 0 %
Eosinophils Absolute: 0.3 10*3/uL (ref 0.0–0.5)
Eosinophils Relative: 4 %
HCT: 25.4 % — ABNORMAL LOW (ref 39.0–52.0)
Hemoglobin: 7.9 g/dL — ABNORMAL LOW (ref 13.0–17.0)
Immature Granulocytes: 0 %
Lymphocytes Relative: 10 %
Lymphs Abs: 0.9 10*3/uL (ref 0.7–4.0)
MCH: 28.8 pg (ref 26.0–34.0)
MCHC: 31.1 g/dL (ref 30.0–36.0)
MCV: 92.7 fL (ref 80.0–100.0)
Monocytes Absolute: 0.7 10*3/uL (ref 0.1–1.0)
Monocytes Relative: 8 %
Neutro Abs: 7 10*3/uL (ref 1.7–7.7)
Neutrophils Relative %: 78 %
Platelets: 241 10*3/uL (ref 150–400)
RBC: 2.74 MIL/uL — ABNORMAL LOW (ref 4.22–5.81)
RDW: 18.8 % — ABNORMAL HIGH (ref 11.5–15.5)
WBC: 9 10*3/uL (ref 4.0–10.5)
nRBC: 0 % (ref 0.0–0.2)

## 2020-08-22 LAB — SODIUM: Sodium: 154 mmol/L — ABNORMAL HIGH (ref 135–145)

## 2020-08-22 LAB — MAGNESIUM: Magnesium: 2.3 mg/dL (ref 1.7–2.4)

## 2020-08-22 MED ORDER — OSMOLITE 1.5 CAL PO LIQD
237.0000 mL | ORAL | Status: DC
  Administered 2020-08-22 – 2020-08-25 (×16): 237 mL

## 2020-08-22 MED ORDER — POTASSIUM CHLORIDE 20 MEQ PO PACK
40.0000 meq | PACK | Freq: Once | ORAL | Status: AC
  Administered 2020-08-22: 40 meq
  Filled 2020-08-22: qty 2

## 2020-08-22 MED ORDER — KCL IN DEXTROSE-NACL 40-5-0.45 MEQ/L-%-% IV SOLN
INTRAVENOUS | Status: DC
  Filled 2020-08-22 (×4): qty 1000

## 2020-08-22 MED ORDER — POTASSIUM CHLORIDE 2 MEQ/ML IV SOLN: INTRAVENOUS | Status: DC

## 2020-08-22 NOTE — Progress Notes (Signed)
Nutrition Follow-up  DOCUMENTATION CODES:   Not applicable  INTERVENTION:  Provide Osmolite 1.5 Cal 1 carton (237 mL) 8 times daily per tube + PROSource TF 45 mL BID per tube. Provides 2920 kcal, 141 grams of protein, 1448 mL H2O daily. Unable to order 8 times daily in Epic. RD has updated times on Q3hrs order so patient receives bolus feeds 8 times daily during awake hours.  With current free water flush orders of 300 mL Q4hrs and 200 mL Q8hrs patient will receive a total of 3848 mL H2O daily including water in tube feeding. Could also consider simplifying order to be 400 mL Q4hrs, which provides the same amount of total free water (this is what patient was ordered for last admission).  Recommend providing flush of 20 mL before and after each bolus feeding to maintain tube patency.  Continue Juven 1 packet per tube BID. Each packet provides 95 kcal, 7 grams L-Arginine, 7 grams L-Glutamine, 2.5 grams collagen protein, 300 mg vitamin C, 9.5 mg zinc, and other micronutrients essential for wound healing.  NUTRITION DIAGNOSIS:   Inadequate oral intake related to inability to eat as evidenced by NPO status (PEG dependent for nutrition/hydration/medication).  Ongoing.  GOAL:   Patient will meet greater than or equal to 90% of their needs  Met with TF regimen.  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   11/24 s/p debridement of skin, subcutaneous tissue, and muscle with deep bone biopsy and placement of wound VAC for stage IV sacral pressure injury  Met with patient and his wife at  bedside. He is on 5 L/min O2 via trach collar. Patient endorses he is tolerating tube feeds well with 1 can at a time 8 times daily. Tube feeds are currently ordered for Q3hrs due to limited ordering options in Epic. Patient and wife requesting tube feeds be ordered every 2 hours during awake hours so he is not woken up for tube feeds. Wife requests no tube feeds after 8pm. Plan for possible discharge tomorrow but wife would like to speak with TOC today first before discharge. Attempted to call case manager and also sent secure chat to let her know of request but did not hear back. There was a concern with insurance coverage of Osmolite 1.5 Cal. Per TOC Benefit Eligibility note on 11/24 DME pays 70% of nutritional supplements and patient has already met the $2000 in-network deductible and $6500 individual out-of-pocket.  Medications reviewed and include: famotidine, Osmolite 1.5 Cal 237 mL Q3hrs, PROSource TF 45 mL BID, ferrous sulfate 78 mg daily, free water 300 mL Q4hrs, free water 200 mL Q8hrs (new order added today), Juven BID, D5-1/2NS with KCl 40 mEq/L at 75 mL/hr, Zosyn.  Labs reviewed: Sodium 154, Chloride 119, BUN 24, Creatinine 0.5.  Enteral Access: 20 Fr. G-tube present on admission (appears to be an Endovive Safety G-tube with c-clamp)  I/O: 1700 mL UOP yesterday (0.6 mL/kg/hr)  No weight to trend since 11/23.  Discussed with RN.  Diet Order:   Diet Order    None     EDUCATION NEEDS:   No education needs have been identified   at this time  Skin:  Skin Assessment: Skin Integrity Issues: Skin Integrity Issues:: Stage IV Stage IV: sacrum with wound VAC Unstageable: N/A  Last BM:  08/21/2020 - medium type 7  Height:   Ht Readings from Last 1 Encounters:  08/15/20 6' 5" (1.956 m)   Weight:   Wt Readings from Last 1 Encounters:  08/15/20 113.1 kg   BMI:  Body mass index is 29.57 kg/m.  Estimated Nutritional Needs:   Kcal:  2600-2800  Protein:  145-165  Fluid:   >/= 2.5 L/day  Leanne King, MS, RD, LDN Pager number available on Amion 

## 2020-08-22 NOTE — Progress Notes (Signed)
PROGRESS NOTE    Charlie Seda  TLX:[id number] DOB: October 02, 1950 DOA: 08/14/2020 PCP: Valerie Roys, DO   Brief hospital summary:  Alexes Lamarque is a 69 y.o. 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  Patient also have a sacral bedsore which has been concerning to patient's wife due to malodorous and draining pus. S/p OR large debridement by general surgery on 08/16/2020.  Now has wound VAC.  Patient remains on IV vancomycin and cefepime.  All the blood cultures are negative so far.  Wound culture is growing enterococci.  ID consulted.  Recommend 6 weeks of IV Zosyn.  General surgery and ID has cleared the patient.   Assessment & Plan:   Active Problems:   Hyperlipidemia   Elevated alkaline phosphatase level   Spinal cord injury, cervical region, sequela (HCC)   Quadriplegia (HCC)   Neurogenic orthostatic hypotension (HCC)   Sacral decubitus ulcer, stage II (HCC)   Aspiration pneumonia (HCC)   Pneumonia   Hypokalemia   Hypoalbuminemia   Leukocytosis   Decubitus ulcer of sacral region, stage 4 (HCC)      Aspiration pneumonia: --Chest x-ray showed partial consolidation at the right base, which has been present since last hospitalization.  But also suggested possible pneumonia.  No fever but some leukocytosis and has rhonchi on examination today.  Repeat x-ray shows some improvement.    Increased airway secretion Hx of mucus plugging  --Wife reported difficulty suctioning secretion at home due to secretion being too thick.  Pt has been on scopolamine patch for a while.  RT, however, noted thin copious amount of  white secretion. --scopolamine patch d/c'ed on 11/23 PLAN: --Robinul 1 mg TID PRN for increased secretion --pulm hygiene with RT --trach suctioning per RT  Sepsis 2/2 Large left sacral decubitus ulcer Left sacral osteomyelitis Malodorous purulent drainage noted on sacral decubitus ulcer by wife.  S/p OR large debridement by general surgery on 08/16/2020.  Now has wound VAC.  Patient remains on IV vancomycin and cefepime.  All the blood cultures are negative so far.  Wound culture is growing Enterobacter clcoacae. ID consulted.  Both ID and general surgery looked at the wound yesterday along with wound care nurse and general surgery does not feel that he needs another debridement.  They cleared the patient for discharge.  ID has cleared the patient for discharge as well and they recommend 6 weeks of IV Zosyn.  OP AT has been done.  Hypokalemia Down to 3.1 today despite of getting replacement yesterday.  We will give him 40 mEq through PEG tube and some through IV fluids.  PEG-dependent --wife reported insurance stopped covering for osmolite and pt needs cheaper tube feed option PLAN: --resume tube feed after surgery --TOC to look into insurance coverage of tube feed at home  6.  Hypotension -Blood pressure stable-cont home midodrine and Florinef  7.  Spinal cord injury, cervical region with subsequent quadriplegia Patient had C5 vertebral body fracture, C3-C6 cord compression with edema status post C3-C6 laminectomy with fusion and repair of the dural tear in 04/2020 at [facility]  8.  GERD Continue famotidine per home regimen  10.  Depression --cont home  prozac  11.  Anemia, iron def Hemoglobin stable at 8's --anemia workup showed only iron def --cont home iron suppl  12.  Hypernatremia: Patient's sodium has been going up since last couple of days, currently 154.  Will start patient on dextrose with 0.25 normal saline and monitor sodium every 8 hours.  DVT prophylaxis:  [id number] as DVT ppx Code Status: Full code confirmed by outpatient palliative Family Communication: None present at bedside.  Discussed with his wife over the phone about plan of care. Status is: inpatient Dispo:   The patient is from: home Anticipated d/c is to: home Anticipated d/c date is: Architectural technologist.  Plan was to discharge him today however due to worsening hypernatremia, patient needs dextrose fluid overnight.  Close monitoring of sodium.  Hopefully hyponatremia and hypokalemia will be corrected tomorrow and he will be discharged tomorrow.  TOC also working on home health and wound VAC arrangement. Patient currently is not medically stable to d/c due to: Electrolyte abnormalities   Subjective and Interval History:  Seen and examined.  Wife not at bedside.  Patient remains a stable without any complaints.  He is less congested today.   Objective: Vitals:   08/21/20 2200 08/22/20 0018 08/22/20 0327 08/22/20 0810  BP:  111/72 107/70 122/72  Pulse:  67 64 62  Resp:  17 17 18   Temp:  98 F (36.7 C) 97.8 F (36.6 C)   TempSrc:      SpO2: 100% 100% 100%   Weight:      Height:        Intake/Output Summary (Last 24 hours) at 08/22/2020 0931 Last data filed at 08/22/2020 0600 Gross per 24 hour  Intake 12835.11 ml  Output 1900 ml  Net 10935.11 ml   Filed Weights   08/15/20 0505  Weight: 113.1 kg    Examination:   General exam: Appears calm and comfortable with trach and PEG Respiratory system: Rhonchi bilaterally, respiratory effort normal. Cardiovascular system: S1 & S2 heard, RRR. No JVD, murmurs, rubs, gallops or clicks. No pedal edema. Gastrointestinal system: Abdomen is nondistended, soft and nontender. No organomegaly or masses felt. Normal bowel sounds heard. Central nervous system: Alert and oriented.  Quadriplegic Skin: Sacral ulcer, dressing in place. Psychiatry: Judgement and insight appear normal. Mood & affect appropriate.   Data Reviewed: I have personally  reviewed following labs and imaging studies  CBC: Recent Labs  Lab 08/18/20 0357 08/19/20 0418 08/20/20 0558 08/21/20 0510 08/22/20 0649  WBC 10.0 8.2 11.7* 11.8* 9.0  NEUTROABS  --  5.4 9.2* 9.3* 7.0  HGB 8.4* 8.8* 8.7* 8.7* 7.9*  HCT 25.7* 28.8* 28.3* 28.0* 25.4*  MCV 87.7 91.4 90.1 90.9 92.7  PLT 294 299 288 248 858   Basic Metabolic Panel: Recent Labs  Lab 08/16/20 1201 08/16/20 1201 08/17/20 0634 08/17/20 0634 08/18/20 0357 08/19/20 0418 08/20/20 0558 08/21/20 0510 08/22/20 0649  NA 140   < > 141   < > 144 145 149* 150* 154*  K 3.9   < > 3.8   < > 3.5 3.7 3.4* 3.2* 3.1*  CL 102   < > 104   < > 104 106 108 111 115*  CO2 29   < > 28   < > 32 31 29 30 30   GLUCOSE 137*   < > 198*   < > 134* 116* 151* 110* 174*  BUN 18   < > 20   < > 24* 25* 29* 27* 26*  CREATININE 0.79   < >  0.70   < > 0.55* 0.62 0.66 0.63 0.59*  CALCIUM 9.4   < > 9.2   < > 8.9 9.4 9.8 9.6 9.6  MG 2.1  --  2.2  --  2.1  --   --   --  2.3   < > = values in this interval not displayed.   GFR: Estimated Creatinine Clearance: 123.4 mL/min (A) (by C-G formula based on SCr of 0.59 mg/dL (L)). Liver Function Tests: Recent Labs  Lab 08/19/20 0418 08/20/20 0558  AST 28 20  ALT 49* 38  ALKPHOS 125 121  BILITOT 0.5 0.7  PROT 7.5 7.9  ALBUMIN 2.5* 2.5*   No results for input(s): LIPASE, AMYLASE in the last 168 hours. No results for input(s): AMMONIA in the last 168 hours. Coagulation Profile: No results for input(s): INR, PROTIME in the last 168 hours. Cardiac Enzymes: No results for input(s): CKTOTAL, CKMB, CKMBINDEX, TROPONINI in the last 168 hours. BNP (last 3 results) No results for input(s): PROBNP in the last 8760 hours. HbA1C: No results for input(s): HGBA1C in the last 72 hours. CBG: No results for input(s): GLUCAP in the last 168 hours. Lipid Profile: No results for input(s): CHOL, HDL, LDLCALC, TRIG, CHOLHDL, LDLDIRECT in the last 72 hours. Thyroid Function Tests: No results for  input(s): TSH, T4TOTAL, FREET4, T3FREE, THYROIDAB in the last 72 hours. Anemia Panel: No results for input(s): VITAMINB12, FOLATE, FERRITIN, TIBC, IRON, RETICCTPCT in the last 72 hours. Sepsis Labs: No results for input(s): PROCALCITON, LATICACIDVEN in the last 168 hours.  Recent Results (from the past 240 hour(s))  Resp Panel by RT-PCR (Flu A&B, Covid) Nasopharyngeal Swab     Status: None   Collection Time: 08/14/20  4:51 PM   Specimen: Nasopharyngeal Swab; Nasopharyngeal(NP) swabs in vial transport medium  Result Value Ref Range Status   SARS Coronavirus 2 by RT PCR NEGATIVE NEGATIVE Final    Comment: (NOTE) SARS-CoV-2 target nucleic acids are NOT DETECTED.  The SARS-CoV-2 RNA is generally detectable in upper respiratory specimens during the acute phase of infection. The lowest concentration of SARS-CoV-2 viral copies this assay can detect is 138 copies/mL. A negative result does not preclude SARS-Cov-2 infection and should not be used as the sole basis for treatment or other patient management decisions. A negative result may occur with  improper specimen collection/handling, submission of specimen other than nasopharyngeal swab, presence of viral mutation(s) within the areas targeted by this assay, and inadequate number of viral copies(<138 copies/mL). A negative result must be combined with clinical observations, patient history, and epidemiological information. The expected result is Negative.  Fact Sheet for Patients:  EntrepreneurPulse.com.au  Fact Sheet for Healthcare Providers:  IncredibleEmployment.be  This test is no t yet approved or cleared by the [place] FDA and  has been authorized for detection and/or diagnosis of SARS-CoV-2 by FDA under an Emergency Use Authorization (EUA). This EUA will remain  in effect (meaning this test can be used) for the duration of the COVID-19 declaration under Section 564(b)(1) of the Act,  21 U.S.C.section 360bbb-3(b)(1), unless the authorization is terminated  or revoked sooner.       Influenza A by PCR NEGATIVE NEGATIVE Final   Influenza B by PCR NEGATIVE NEGATIVE Final    Comment: (NOTE) The Xpert Xpress SARS-CoV-2/FLU/RSV plus assay is intended as an aid in the diagnosis of influenza from Nasopharyngeal swab specimens and should not be used as a sole basis for treatment. Nasal washings and aspirates are unacceptable for Xpert  Xpress SARS-CoV-2/FLU/RSV testing.  Fact Sheet for Patients: EntrepreneurPulse.com.au  Fact Sheet for Healthcare Providers: IncredibleEmployment.be  This test is not yet approved or cleared by the [place] FDA and has been authorized for detection and/or diagnosis of SARS-CoV-2 by FDA under an Emergency Use Authorization (EUA). This EUA will remain in effect (meaning this test can be used) for the duration of the COVID-19 declaration under Section 564(b)(1) of the Act, 21 U.S.C. section 360bbb-3(b)(1), unless the authorization is terminated or revoked.  Performed at [facility], [address] [place], [place] [address]   Culture, blood (routine x 2)     Status: None   Collection Time: 08/14/20  6:44 PM   Specimen: BLOOD  Result Value Ref Range Status   Specimen Description BLOOD LEFT ANTECUBITAL  Final   Special Requests   Final    BOTTLES DRAWN AEROBIC AND ANAEROBIC Blood Culture results may not be optimal due to an excessive volume of blood received in culture bottles   Culture   Final    NO GROWTH 5 DAYS Performed at [facility], [address], [place], [place] [address]    Report Status 08/19/2020 FINAL  Final  Culture, blood (routine x 2)     Status: None   Collection Time: 08/14/20  6:45 PM   Specimen: BLOOD  Result Value Ref Range Status   Specimen Description BLOOD BLOOD RIGHT FOREARM  Final   Special Requests   Final    BOTTLES DRAWN AEROBIC AND  ANAEROBIC Blood Culture adequate volume   Culture   Final    NO GROWTH 5 DAYS Performed at [facility], [address], [place], [place] [address]    Report Status 08/19/2020 FINAL  Final  Culture, respiratory     Status: None   Collection Time: 08/15/20  3:20 AM   Specimen: SPU  Result Value Ref Range Status   Specimen Description   Final    SPUTUM Performed at [facility], [address] [place], [place] [address]    Special Requests   Final    NONE Performed at [facility], [address] [place], [place] [address]    Gram Stain   Final    ABUNDANT WBC PRESENT,BOTH PMN AND MONONUCLEAR FEW GRAM POSITIVE RODS RARE GRAM POSITIVE COCCI IN PAIRS    Culture   Final    RARE Normal respiratory flora-no Staph aureus or Pseudomonas seen Performed at [facility], [place] [address], [place], [place] [address]    Report Status 08/17/2020 FINAL  Final  Aerobic/Anaerobic Culture (surgical/deep wound)     Status: None   Collection Time: 08/16/20  9:51 AM   Specimen: PATH Other; Tissue  Result Value Ref Range Status   Specimen Description TISSUE SACRAL  Final   Special Requests BONE  Final   Gram Stain   Final    RARE WBC PRESENT,BOTH PMN AND MONONUCLEAR NO ORGANISMS SEEN    Culture   Final    RARE ENTEROBACTER CLOACAE FEW STREPTOCOCCUS CONSTELLATUS FEW BACTEROIDES THETAIOTAOMICRON BETA LACTAMASE POSITIVE Performed at [facility], [place] [address] [place], [place] [address]    Report Status 08/19/2020 FINAL  Final   Organism ID, Bacteria ENTEROBACTER CLOACAE  Final      Susceptibility   Enterobacter cloacae - MIC*    CEFAZOLIN >=64 RESISTANT Resistant     CEFEPIME <=0.12 SENSITIVE Sensitive     CEFTAZIDIME <=1 SENSITIVE Sensitive     CIPROFLOXACIN <=0.25 SENSITIVE  Sensitive     GENTAMICIN <=1 SENSITIVE Sensitive     IMIPENEM 0.5 SENSITIVE Sensitive     TRIMETH/SULFA <=20 SENSITIVE Sensitive     PIP/TAZO <=4 SENSITIVE Sensitive      * RARE ENTEROBACTER CLOACAE  Aerobic/Anaerobic Culture (surgical/deep wound)     Status: Abnormal   Collection Time: 08/16/20  9:52 AM   Specimen: PATH Other; Tissue  Result Value Ref Range Status   Specimen Description   Final    ABSCESS Performed at [facility], [address], [place], [place] [address]    Special Requests   Final    SACRAL DECUBITUS ULCER Performed at [facility], [place], [place] [address]    Gram Stain NO WBC SEEN NO ORGANISMS SEEN NO ORGANISMS SEEN   Final   Culture (A)  Final    MULTIPLE ORGANISMS PRESENT, NONE PREDOMINANT FEW BACTEROIDES THETAIOTAOMICRON BETA LACTAMASE POSITIVE Performed at [name], [place] [address] [place],  [address]    Report Status 08/19/2020 FINAL  Final      Radiology Studies: [place] EKG SITE RITE  Result Date: 08/21/2020 If Site Rite image not attached, placement could not be confirmed due to current cardiac rhythm.    Scheduled Meds: . bisacodyl  10 mg Rectal QHS  . Chlorhexidine Gluconate Cloth  6 each Topical Daily  . famotidine  20 mg Per Tube Daily  . feeding supplement (OSMOLITE 1.5 CAL)  237 mL Per Tube Q3H  . feeding supplement (PROSource TF)  45 mL Per Tube BID  . ferrous sulfate  78 mg Oral Q breakfast  . fludrocortisone  0.2 mg Per Tube Daily  . FLUoxetine  40 mg Per Tube Daily  . free water  200 mL Per Tube Q8H  . free water  300 mL Per Tube Q4H  . influenza vaccine adjuvanted  0.5 mL Intramuscular Tomorrow-1000  . midodrine  10 mg Per Tube TID WC  . nutrition supplement (JUVEN)  1 packet Per Tube BID BM  . rivaroxaban  10 mg Per Tube Daily  . sodium chloride flush  10-40 mL Intracatheter Q12H   Continuous Infusions: . dextrose 5 % and 0.45 % NaCl with KCl 40 mEq/L 75 mL/hr at 08/22/20 0856  . piperacillin-tazobactam (ZOSYN)  IV 12.5 mL/hr at 08/22/20 0600     LOS: 8 days   Darliss Cheney, MD [facility] If 7PM-7AM, please contact  night-coverage 08/22/2020, 9:31 AM

## 2020-08-23 ENCOUNTER — Encounter: Payer: Self-pay | Admitting: Internal Medicine

## 2020-08-23 DIAGNOSIS — D649 Anemia, unspecified: Secondary | ICD-10-CM | POA: Diagnosis not present

## 2020-08-23 DIAGNOSIS — E870 Hyperosmolality and hypernatremia: Secondary | ICD-10-CM

## 2020-08-23 DIAGNOSIS — L89154 Pressure ulcer of sacral region, stage 4: Secondary | ICD-10-CM | POA: Diagnosis not present

## 2020-08-23 DIAGNOSIS — M4628 Osteomyelitis of vertebra, sacral and sacrococcygeal region: Secondary | ICD-10-CM | POA: Diagnosis not present

## 2020-08-23 LAB — BASIC METABOLIC PANEL
Anion gap: 9 (ref 5–15)
Anion gap: 10 (ref 5–15)
Anion gap: 8 (ref 5–15)
BUN: 19 mg/dL (ref 8–23)
BUN: 18 mg/dL (ref 8–23)
BUN: 18 mg/dL (ref 8–23)
CO2: 28 mmol/L (ref 22–32)
CO2: 26 mmol/L (ref 22–32)
CO2: 27 mmol/L (ref 22–32)
Calcium: 9.9 mg/dL (ref 8.9–10.3)
Calcium: 9.6 mg/dL (ref 8.9–10.3)
Calcium: 9.3 mg/dL (ref 8.9–10.3)
Chloride: 117 mmol/L — ABNORMAL HIGH (ref 98–111)
Chloride: 116 mmol/L — ABNORMAL HIGH (ref 98–111)
Chloride: 115 mmol/L — ABNORMAL HIGH (ref 98–111)
Creatinine, Ser: 0.59 mg/dL — ABNORMAL LOW (ref 0.61–1.24)
Creatinine, Ser: 0.54 mg/dL — ABNORMAL LOW (ref 0.61–1.24)
Creatinine, Ser: 0.62 mg/dL (ref 0.61–1.24)
GFR, Estimated: >60 mL/min (ref >60)
GFR, Estimated: >60 mL/min (ref >60)
GFR, Estimated: >60 mL/min (ref >60)
Glucose, Bld: 111 mg/dL — ABNORMAL HIGH (ref 70–99)
Glucose, Bld: 119 mg/dL — ABNORMAL HIGH (ref 70–99)
Glucose, Bld: 186 mg/dL — ABNORMAL HIGH (ref 70–99)
Potassium: 3.6 mmol/L (ref 3.5–5.1)
Potassium: 3.5 mmol/L (ref 3.5–5.1)
Potassium: 3.2 mmol/L — ABNORMAL LOW (ref 3.5–5.1)
Sodium: 154 mmol/L — ABNORMAL HIGH (ref 135–145)
Sodium: 152 mmol/L — ABNORMAL HIGH (ref 135–145)
Sodium: 150 mmol/L — ABNORMAL HIGH (ref 135–145)

## 2020-08-23 LAB — CBC WITH DIFFERENTIAL/PLATELET
Abs Immature Granulocytes: 0.03 10*3/uL (ref 0.00–0.07)
Basophils Absolute: 0 10*3/uL (ref 0.0–0.1)
Basophils Relative: 0 %
Eosinophils Absolute: 0.3 10*3/uL (ref 0.0–0.5)
Eosinophils Relative: 5 %
HCT: 25.4 % — ABNORMAL LOW (ref 39.0–52.0)
Hemoglobin: 7.8 g/dL — ABNORMAL LOW (ref 13.0–17.0)
Immature Granulocytes: 0 %
Lymphocytes Relative: 15 %
Lymphs Abs: 1.1 10*3/uL (ref 0.7–4.0)
MCH: 28.4 pg (ref 26.0–34.0)
MCHC: 30.7 g/dL (ref 30.0–36.0)
MCV: 92.4 fL (ref 80.0–100.0)
Monocytes Absolute: 0.6 10*3/uL (ref 0.1–1.0)
Monocytes Relative: 9 %
Neutro Abs: 5.1 10*3/uL (ref 1.7–7.7)
Neutrophils Relative %: 71 %
Platelets: 239 10*3/uL (ref 150–400)
RBC: 2.75 MIL/uL — ABNORMAL LOW (ref 4.22–5.81)
RDW: 18.4 % — ABNORMAL HIGH (ref 11.5–15.5)
WBC: 7.2 10*3/uL (ref 4.0–10.5)
nRBC: 0 % (ref 0.0–0.2)

## 2020-08-23 MED ORDER — POTASSIUM CHLORIDE 20 MEQ PO PACK
40.0000 meq | PACK | Freq: Two times a day (BID) | ORAL | Status: DC
  Administered 2020-08-23: 40 meq

## 2020-08-23 MED ORDER — DEXTROSE 5 % IV SOLN: INTRAVENOUS | Status: AC

## 2020-08-23 NOTE — Progress Notes (Signed)
ID Pt says he is waiting to go home No specific complaints Clearing secretions from tracheostomy No fever  Objective Awake and alert Tracheostomy B/l air entry Hss1s Abd soft Peg in place Foley Quadriparesis Sacral wound covered with wound vac  Labs CBC    Component Value Date/Time   WBC 7.2 08/23/2020 0524   RBC 2.75 (L) 08/23/2020 0524   HGB 7.8 (L) 08/23/2020 0524   HGB 14.7 02/01/2020 0927   HCT 25.4 (L) 08/23/2020 0524   HCT 44.1 02/01/2020 0927   PLT 239 08/23/2020 0524   PLT 267 02/01/2020 0927   MCV 92.4 08/23/2020 0524   MCV 96 02/01/2020 0927   MCH 28.4 08/23/2020 0524   MCHC 30.7 08/23/2020 0524   RDW 18.4 (H) 08/23/2020 0524   RDW 12.3 02/01/2020 0927   LYMPHSABS 1.1 08/23/2020 0524   LYMPHSABS 1.5 02/01/2020 0927   MONOABS 0.6 08/23/2020 0524   EOSABS 0.3 08/23/2020 0524   EOSABS 0.1 02/01/2020 0927   BASOSABS 0.0 08/23/2020 0524   BASOSABS 0.0 02/01/2020 0927    CMP Latest Ref Rng & Units 08/23/2020 08/23/2020 08/22/2020  Glucose 70 - 99 mg/dL 119(H) 111(H) -  BUN 8 - 23 mg/dL 18 19 -  Creatinine 0.61 - 1.24 mg/dL 0.54(L) 0.59(L) -  Sodium 135 - 145 mmol/L 152(H) 154(H) 154(H)  Potassium 3.5 - 5.1 mmol/L 3.5 3.6 -  Chloride 98 - 111 mmol/L 116(H) 117(H) -  CO2 22 - 32 mmol/L 26 28 -  Calcium 8.9 - 10.3 mg/dL 9.6 9.9 -  Total Protein 6.5 - 8.1 g/dL - - -  Total Bilirubin 0.3 - 1.2 mg/dL - - -  Alkaline Phos 38 - 126 U/L - - -  AST 15 - 41 U/L - - -  ALT 0 - 44 U/L - - -    Impression/recommendation  Patient with cervical spine injury/fracture at c55following a fall and was at [facility] in August and underwent decompressive surgery and arthrodesis and fusion c3-c6 on 04/26/20. He has quadriplegia, Hastracheostomy, PEG and Foley.  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 Acute osteomyelitis of the sacral bone Enterobacterinculture. On IV zosyn - for total of 6 weeks  Rt lower lobe pneumonia- mucus plugs, due to inadequate  clearing of secretions, much improved  Anemia from all of the above and prolonged hospitalization  I will follow him as OP ID will sign off- call if needed

## 2020-08-23 NOTE — TOC Transition Note (Addendum)
Transition of Care [facility]) - CM/SW Discharge Note   Patient Details  Name: Ryan Day MRN: [id number] Date of Birth: 10-Jun-1951  [facility] CM/SW Contact:  Meriel Flavors, LCSW Phone Number: 08/23/2020, 3:56 PM   Clinical Narrative:    CSW has tried every agency in network with patient's insurance, none were able to accept. Patient was excalated with no resolution. CSW reached out to family to discuss plan. Patient actually has two daughters, both are RN nurses. Ryan Day has agreed to provide care for patient/her dad. She requested [place] train her on specifics, she is planning to come tomorrow for training. CSW contacted [employer] with referral for DME/wound vac and supplies. He will bring form on Thursday to be signed and will provide patient needs. Palliative consult in also Patient should be set for discharge with TOC.        Patient Goals and CMS Choice        Discharge Placement  Home with home health/wound care provided by patient's daughter, Ryan Day, an English as a second language teacher. DME provided by [employer]                     Discharge Plan and Services                                     Social Determinants of Health (SDOH) Interventions     Readmission Risk Interventions No flowsheet data found.

## 2020-08-23 NOTE — Consult Note (Signed)
[place] Nurse wound follow up Wound type:Stage 4 sacral pressure injury. VAC dressing change today.  Loose stools continue.  Measurement: unchanged   Wound bed:red, bleeds with cleansing.  Drainage (amount, consistency, odor) moderate blood tinged effluent in canister.  Periwound: scarring Dressing procedure/placement/frequency:Continue Mon/Wed/Fri VAc dressing changes.  Will follow.  Domenic Moras MSN, RN, FNP-BC CWON Wound, Ostomy, Continence Nurse Pager [phone]

## 2020-08-23 NOTE — Progress Notes (Signed)
PROGRESS NOTE    Ryan Ryan Day  RFF:[id number] DOB: March 22, 1951 DOA: 08/14/2020 PCP: Ryan Roys, DO   Brief narrative  Ryan Ryan Day is a 69 y.o. 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  Patient also has a sacral pressure ulcer which had been concerning to patient's wife Ryan Day to malodorous and draining pus.    Patient was seen by surgery during hospitalization and underwent debridement  on 08/16/2020.  Now has wound VAC.  Patient remains on IV vancomycin and cefepime.  Blood cultures are negative so far..  Wound culture showed enterococci.  ID was consulted.  Recommended 6 weeks of IV Zosyn.  General surgery and ID has cleared the patient for discharge.  Pending home health wound VAC arrangements on discharge   Assessment & Plan:   Principal Problem:   Aspiration pneumonia ([place]) Active Problems:   Hyperlipidemia   Elevated alkaline phosphatase level   Spinal cord injury, cervical region, sequela (HCC)   Quadriplegia (HCC)   Neurogenic orthostatic hypotension (HCC)   Sacral decubitus ulcer, stage II (HCC)   Pneumonia   Hypokalemia   Hypoalbuminemia   Leukocytosis   Decubitus ulcer of sacral region, stage 4 (HCC)  Hypernatremia: Sodium of 154 yesterday and on repeat today.  Has been started on dextrose with one fourth normal saline yesterday and is on water flushes as well.  Will change IV fluids to D5 water at 100 mL/h.  Check BMP p.m.  Aspiration pneumonia: History of tracheostomy. Chest x-ray with consolidation of the right lung base.  Unchanged from prior.  Patient received antibiotic during  hospitalization.  Plan is to continue Zosyn IV on discharge.  OPAT consulted. Repeat chest x-ray showed improvement.    Increased airway secretion/Hx of mucus plugging  Was on a scopolamine patch at home.  Has been discontinued on 11/23.  Respiratory therapy on board.  Will need continued trach suctioning.    Sepsis secondary to Large left sacral decubitus ulcer with underlying osteomyelitis Status post debridement for infected ulcer on 08/16/2020 by general surgery.  Continue wound VAC.  Wound culture with Enterobacter.  ID recommended 6 weeks of IV Zosyn on discharge.   Hypokalemia Improved with replacement.  Potassium of 3.8 today.  PEG-dependent Tube feeding at home.  Financial issues with [employer], transition of care looking into it  Hypotension On midodrine and Florinef.  Blood pressure seems to be stable at this time  Cervical cord injury with quadriplegia.   Patient had C5 vertebral body fracture, C3-C6 cord compression with edema status post C3-C6 laminectomy with fusion and repair of the dural tear in 04/2020 at [facility].  Continue supportive care  GERD Continue famotidine   Depression Continue Prozac  Iron deficiency anemia.  Continue iron supplements   DVT prophylaxis: On Xarelto  Code Status: Full code   Family Communication:  Spoke with the patient's family at bedside  Status: inpatient  Disposition:   The patient is from: home Anticipated d/c is to: home Anticipated d/c date is: 1-2 days Hypernatremia, TOC also working on home health and wound VAC arrangement. Patient currently is not medically stable to d/c Ryan Day to: Significant hypernatremia   Subjective Today, patient  was seen and examined at bedside.  Patient denies any headache, nausea, vomiting, shortness of breath.  Patient's family at bedside.   Objective: Vitals:   08/22/20 1739 08/22/20 2010 08/23/20 0015 08/23/20 0424  BP: 123/69 110/61 128/74 110/66  Pulse: 65 66 63 70  Resp: 18       Temp: 97.7 F (36.5 C) (!) 97.5 F (36.4 C) (!) 97.3 F (36.3 C) (!) 97.4 F (36.3 C)  TempSrc: Oral     SpO2: 100% 100% 100% 100%  Weight:      Height:        Intake/Output Summary (Last 24 hours) at 08/23/2020 0750 Last data filed at 08/23/2020 0602 Gross per 24 hour  Intake 829.11 ml  Output 2250 ml  Net -1420.89 ml   Filed Weights   08/15/20 0505  Weight: 113.1 kg    Examination:  General:  Average built, not in obvious distress, status post trach and PEG HENT:   No scleral pallor or icterus noted. Oral mucosa is moist.  Tracheostomy in place. Chest: Bilateral rhonchi noted, coarse breath sounds heard. CVS: S1 &S2 heard. No murmur.  Regular rate and rhythm. Abdomen: Soft, nontender, nondistended.  Bowel sounds are heard.  PEG tube in place, Foley catheter in place Extremities: No cyanosis, clubbing or edema.  Peripheral pulses are palpable.  Right upper extremity PICC line in place Psych: Alert, awake and oriented, normal mood CNS:  No cranial nerve deficits.  Quadriplegic Skin: Warm and dry.  Sacral decubitus ulcer with dressing  Data Reviewed: I have personally reviewed following labs and imaging studies  CBC: Recent Labs  Lab 08/19/20 0418 08/20/20 0558 08/21/20 0510 08/22/20 0649 08/23/20 0524  WBC 8.2 11.7* 11.8* 9.0 7.2  NEUTROABS 5.4 9.2* 9.3* 7.0 5.1  HGB 8.8* 8.7* 8.7* 7.9* 7.8*  HCT 28.8* 28.3* 28.0* 25.4* 25.4*  MCV 91.4 90.1 90.9 92.7 92.4  PLT 299 288 248 241 466   Basic Metabolic Panel: Recent Labs  Lab 08/16/20 1201 08/16/20 1201 08/17/20 0634 08/17/20 0634 08/18/20 0357 08/18/20 0357 08/19/20 0418 08/19/20 0418 08/20/20 0558 08/21/20 0510 08/22/20 0649 08/22/20 1406 08/22/20 2028  NA 140   < > 141   < > 144   < > 145   < > 149* 150* 154* 154* 154*  K 3.9   < > 3.8   < > 3.5   < > 3.7  --  3.4* 3.2* 3.1* 3.8  --   CL 102   < > 104   < > 104   < > 106  --  108 111 115* 119*  --   CO2 29   < > 28   < > 32   < > 31  --  29 30 30 27    --   GLUCOSE 137*   < > 198*   < > 134*   < > 116*  --  151* 110* 174* 164*  --   BUN 18   < > 20   < > 24*   < > 25*  --  29* 27* 26* 24*  --   CREATININE 0.79   < > 0.70   < > 0.55*   < > 0.62  --  0.66 0.63 0.59* 0.50*  --   CALCIUM 9.4   < > 9.2   < > 8.9   < > 9.4  --  9.8 9.6 9.6 9.5  --   MG 2.1  --  2.2  --  2.1  --   --   --   --   --  2.3  --   --    < > = values in this interval not displayed.   GFR: Estimated Creatinine Clearance: 123.4 mL/min (A) (by C-G formula based on SCr of 0.5 mg/dL (L)). Liver Function Tests: Recent Labs  Lab 08/19/20 0418 08/20/20 0558  AST 28 20  ALT 49* 38  ALKPHOS 125 121  BILITOT 0.5 0.7  PROT 7.5 7.9  ALBUMIN 2.5* 2.5*   No results for input(s): LIPASE, AMYLASE in the last 168 hours. No results for input(s): AMMONIA in the last 168 hours. Coagulation Profile: No results for input(s): INR, PROTIME in the last 168 hours. Cardiac Enzymes: No results for input(s): CKTOTAL, CKMB, CKMBINDEX, TROPONINI in the last 168 hours. BNP (last 3 results) No results for input(s): PROBNP in the last 8760 hours. HbA1C: No results for input(s): HGBA1C in the last 72 hours. CBG: No results for input(s): GLUCAP in the last 168 hours. Lipid Profile: No results for input(s): CHOL, HDL, LDLCALC, TRIG, CHOLHDL, LDLDIRECT in the last 72 hours. Thyroid Function Tests: No results for input(s): TSH, T4TOTAL, FREET4, T3FREE, THYROIDAB in the last 72 hours. Anemia Panel: No results for input(s): VITAMINB12, FOLATE, FERRITIN, TIBC, IRON, RETICCTPCT in the last 72 hours. Sepsis Labs: No results for input(s): PROCALCITON, LATICACIDVEN in the last 168 hours.  Recent Results (from the past 240 hour(s))  Resp Panel by RT-PCR (Flu A&B, Covid) Nasopharyngeal Swab     Status: None   Collection Time: 08/14/20  4:51 PM   Specimen: Nasopharyngeal Swab; Nasopharyngeal(NP) swabs in vial transport medium  Result Value Ref Range Status   SARS Coronavirus 2 by RT PCR  NEGATIVE NEGATIVE Final    Comment: (NOTE) SARS-CoV-2 target nucleic acids are NOT DETECTED.  The SARS-CoV-2 RNA is generally detectable in upper respiratory specimens during the acute phase of infection. The lowest concentration of SARS-CoV-2 viral copies this assay can detect is 138 copies/mL. A negative result does not preclude SARS-Cov-2 infection and should not be used as the sole basis for treatment or other patient management decisions. A negative result may occur with  improper specimen collection/handling, submission of specimen other than nasopharyngeal swab, presence of viral mutation(s) within the areas targeted by this assay, and inadequate number of viral copies(<138 copies/mL). A negative result must be combined with clinical observations, patient history, and epidemiological information. The expected result is Negative.  Fact Sheet for Patients:  EntrepreneurPulse.com.au  Fact Sheet for Healthcare Providers:  IncredibleEmployment.be  This test is no t yet approved or cleared by the [place] FDA and  has been authorized for detection and/or diagnosis of SARS-CoV-2 by FDA under an Emergency Use Authorization (EUA). This EUA will remain  in effect (meaning this test can be used) for the duration of the COVID-19 declaration under Section 564(b)(1) of the Act, 21 U.S.C.section 360bbb-3(b)(1), unless the authorization is terminated  or revoked sooner.       Influenza A by PCR NEGATIVE NEGATIVE Final   Influenza B by PCR NEGATIVE NEGATIVE Final    Comment: (NOTE) The Xpert Xpress SARS-CoV-2/FLU/RSV plus assay is intended as an aid in the diagnosis of influenza from Nasopharyngeal swab specimens and should not be used as a sole basis for treatment. Nasal washings and aspirates are unacceptable for Xpert Xpress SARS-CoV-2/FLU/RSV testing.  Fact Sheet for Patients: EntrepreneurPulse.com.au  Fact Sheet for  Healthcare Providers: IncredibleEmployment.be  This test is not yet approved or cleared  by the [place] and has been authorized for detection and/or diagnosis of SARS-CoV-2 by FDA under an Emergency Use Authorization (EUA). This EUA will remain in effect (meaning this test can be used) for the duration of the COVID-19 declaration under Section 564(b)(1) of the Act, 21 U.S.C. section 360bbb-3(b)(1), unless the authorization is terminated or revoked.  Performed at [facility], [address] [place], [place] [address]   Culture, blood (routine x 2)     Status: None   Collection Time: 08/14/20  6:44 PM   Specimen: BLOOD  Result Value Ref Range Status   Specimen Description BLOOD LEFT ANTECUBITAL  Final   Special Requests   Final    BOTTLES DRAWN AEROBIC AND ANAEROBIC Blood Culture results may not be optimal Ryan Day to an excessive volume of blood received in culture bottles   Culture   Final    NO GROWTH 5 DAYS Performed at [facility] [address], [place], [place] [address]    Report Status 08/19/2020 FINAL  Final  Culture, blood (routine x 2)     Status: None   Collection Time: 08/14/20  6:45 PM   Specimen: BLOOD  Result Value Ref Range Status   Specimen Description BLOOD BLOOD RIGHT FOREARM  Final   Special Requests   Final    BOTTLES DRAWN AEROBIC AND ANAEROBIC Blood Culture adequate volume   Culture   Final    NO GROWTH 5 DAYS Performed at [facility], [address], [place], [place] [address]    Report Status 08/19/2020 FINAL  Final  Culture, respiratory     Status: None   Collection Time: 08/15/20  3:20 AM   Specimen: SPU  Result Value Ref Range Status   Specimen Description   Final    SPUTUM Performed at [facility], [address] [place], [place] [address]    Special Requests   Final    NONE Performed at [facility], [address] [place], [place] [address]    Gram Stain    Final    ABUNDANT WBC PRESENT,BOTH PMN AND MONONUCLEAR FEW GRAM POSITIVE RODS RARE GRAM POSITIVE COCCI IN PAIRS    Culture   Final    RARE Normal respiratory flora-no Staph aureus or Pseudomonas seen Performed at [facility], [place] [address] [place], [place] [address]    Report Status 08/17/2020 FINAL  Final  Aerobic/Anaerobic Culture (surgical/deep wound)     Status: None   Collection Time: 08/16/20  9:51 AM   Specimen: PATH Other; Tissue  Result Value Ref Range Status   Specimen Description TISSUE SACRAL  Final   Special Requests BONE  Final   Gram Stain   Final    RARE WBC PRESENT,BOTH PMN AND MONONUCLEAR NO ORGANISMS SEEN    Culture   Final    RARE ENTEROBACTER CLOACAE FEW STREPTOCOCCUS CONSTELLATUS FEW BACTEROIDES THETAIOTAOMICRON BETA LACTAMASE POSITIVE Performed at [facility], [place] [address] [place],  [address]    Report Status 08/19/2020 FINAL  Final   Organism ID, Bacteria ENTEROBACTER CLOACAE  Final      Susceptibility   Enterobacter cloacae - MIC*    CEFAZOLIN >=64 RESISTANT Resistant     CEFEPIME <=0.12 SENSITIVE Sensitive     CEFTAZIDIME <=1 SENSITIVE Sensitive     CIPROFLOXACIN <=0.25 SENSITIVE Sensitive     GENTAMICIN <=1 SENSITIVE Sensitive     IMIPENEM 0.5 SENSITIVE Sensitive     TRIMETH/SULFA <=20 SENSITIVE Sensitive  PIP/TAZO <=4 SENSITIVE Sensitive     * RARE ENTEROBACTER CLOACAE  Aerobic/Anaerobic Culture (surgical/deep wound)     Status: Abnormal   Collection Time: 08/16/20  9:52 AM   Specimen: PATH Other; Tissue  Result Value Ref Range Status   Specimen Description   Final    ABSCESS Performed at [facility], [address], [place], [place] [address]    Special Requests   Final    SACRAL DECUBITUS ULCER Performed at [facility], [address] [place], [place] [address]    Gram Stain NO WBC SEEN NO ORGANISMS SEEN NO ORGANISMS SEEN   Final   Culture (A)  Final    MULTIPLE ORGANISMS PRESENT,  NONE PREDOMINANT FEW BACTEROIDES THETAIOTAOMICRON BETA LACTAMASE POSITIVE Performed at [facility], [address] [place], [place] [address]    Report Status 08/19/2020 FINAL  Final      Radiology Studies: [place] EKG SITE RITE  Result Date: 08/21/2020 If Site Rite image not attached, placement could not be confirmed Ryan Day to current cardiac rhythm.    Scheduled Meds: . bisacodyl  10 mg Rectal QHS  . Chlorhexidine Gluconate Cloth  6 each Topical Daily  . famotidine  20 mg Per Tube Daily  . feeding supplement (OSMOLITE 1.5 CAL)  237 mL Per Tube Q3H  . feeding supplement (PROSource TF)  45 mL Per Tube BID  . ferrous sulfate  78 mg Oral Q breakfast  . fludrocortisone  0.2 mg Per Tube Daily  . FLUoxetine  40 mg Per Tube Daily  . free water  200 mL Per Tube Q8H  . free water  300 mL Per Tube Q4H  . influenza vaccine adjuvanted  0.5 mL Intramuscular Tomorrow-1000  . midodrine  10 mg Per Tube TID WC  . nutrition supplement (JUVEN)  1 packet Per Tube BID BM  . rivaroxaban  10 mg Per Tube Daily  . sodium chloride flush  10-40 mL Intracatheter Q12H   Continuous Infusions: . dextrose 5 % and 0.45 % NaCl with KCl 40 mEq/L 75 mL/hr at 08/22/20 0856  . piperacillin-tazobactam (ZOSYN)  IV 3.375 g (08/23/20 0517)     LOS: 9 days   Flora Lipps, MD [facility] If 7PM-7AM, please contact night-coverage 08/23/2020, 7:50 AM

## 2020-08-24 LAB — COMPREHENSIVE METABOLIC PANEL
ALT: 30 U/L (ref 0–44)
AST: 22 U/L (ref 15–41)
Albumin: 2.2 g/dL — ABNORMAL LOW (ref 3.5–5.0)
Alkaline Phosphatase: 96 U/L (ref 38–126)
Anion gap: 8 (ref 5–15)
BUN: 16 mg/dL (ref 8–23)
CO2: 27 mmol/L (ref 22–32)
Calcium: 9.4 mg/dL (ref 8.9–10.3)
Chloride: 112 mmol/L — ABNORMAL HIGH (ref 98–111)
Creatinine, Ser: 0.5 mg/dL — ABNORMAL LOW (ref 0.61–1.24)
GFR, Estimated: >60 mL/min (ref >60)
Glucose, Bld: 106 mg/dL — ABNORMAL HIGH (ref 70–99)
Potassium: 3.2 mmol/L — ABNORMAL LOW (ref 3.5–5.1)
Sodium: 147 mmol/L — ABNORMAL HIGH (ref 135–145)
Total Bilirubin: 0.6 mg/dL (ref 0.3–1.2)
Total Protein: 6.7 g/dL (ref 6.5–8.1)

## 2020-08-24 LAB — CBC WITH DIFFERENTIAL/PLATELET
Abs Immature Granulocytes: 0.03 10*3/uL (ref 0.00–0.07)
Basophils Absolute: 0 10*3/uL (ref 0.0–0.1)
Basophils Relative: 0 %
Eosinophils Absolute: 0.3 10*3/uL (ref 0.0–0.5)
Eosinophils Relative: 5 %
HCT: 24.8 % — ABNORMAL LOW (ref 39.0–52.0)
Hemoglobin: 7.8 g/dL — ABNORMAL LOW (ref 13.0–17.0)
Immature Granulocytes: 1 %
Lymphocytes Relative: 16 %
Lymphs Abs: 1.1 10*3/uL (ref 0.7–4.0)
MCH: 28.7 pg (ref 26.0–34.0)
MCHC: 31.5 g/dL (ref 30.0–36.0)
MCV: 91.2 fL (ref 80.0–100.0)
Monocytes Absolute: 0.6 10*3/uL (ref 0.1–1.0)
Monocytes Relative: 9 %
Neutro Abs: 4.6 10*3/uL (ref 1.7–7.7)
Neutrophils Relative %: 69 %
Platelets: 234 10*3/uL (ref 150–400)
RBC: 2.72 MIL/uL — ABNORMAL LOW (ref 4.22–5.81)
RDW: 18.6 % — ABNORMAL HIGH (ref 11.5–15.5)
WBC: 6.6 10*3/uL (ref 4.0–10.5)
nRBC: 0 % (ref 0.0–0.2)

## 2020-08-24 LAB — PHOSPHORUS: Phosphorus: 2.5 mg/dL (ref 2.5–4.6)

## 2020-08-24 LAB — MAGNESIUM: Magnesium: 2.1 mg/dL (ref 1.7–2.4)

## 2020-08-24 LAB — GLUCOSE, CAPILLARY: Glucose-Capillary: 162 mg/dL — ABNORMAL HIGH (ref 70–99)

## 2020-08-24 MED ORDER — POTASSIUM CHLORIDE 20 MEQ PO PACK: 60.0000 meq | PACK | Freq: Two times a day (BID) | ORAL | Status: DC

## 2020-08-24 MED ORDER — POTASSIUM CHLORIDE 20 MEQ PO PACK
40.0000 meq | PACK | Freq: Two times a day (BID) | ORAL | Status: DC
  Administered 2020-08-24 – 2020-08-25 (×3): 40 meq
  Filled 2020-08-24 (×3): qty 2

## 2020-08-24 MED ORDER — DEXTROSE 5 % IV SOLN: INTRAVENOUS | Status: AC

## 2020-08-24 NOTE — Progress Notes (Signed)
Pharmacy Antibiotic Note  Ryan Day is a 69 y.o. male admitted on 08/14/2020 with pneumonia. Respiratory culture with GPR and GPC. Pharmacy has been consulted for Zosyn dosing. Patient was previously on Vancomycin - last dose was 11/26. Patient has medical history significant forhypotension, dyslipidemia, quadriplegics/pfall resulting in cervical spinal cord injury -August 2021 (admitted and treated at [facility] for 2 months;rehab at [facility]), trach and PEG tube dependent. Patient also has Stage IV sacral decubitus s/p debridement 11/24; acute osteomyelitis of the sacral bone with enterobacter cloacae in culture resistant to cefazolin.    Plan:  Continue Zosyn 3.375g IV Q8H extended infusion  Monitor renal function and adjust dose as clinically indicated ID planning for 6 weeks of antibiotics - OPAT orders are in  Height: 6\' 5"  (195.6 cm) Weight: 113.1 kg (249 lb 5.4 oz) IBW/kg (Calculated) : 89.1  Temp (24hrs), Avg:97.6 F (36.4 C), Min:97.4 F (36.3 C), Max:97.8 F (36.6 C)  Recent Labs  Lab 08/18/20 1315 08/19/20 0418 08/20/20 0558 08/20/20 0558 08/21/20 0510 08/21/20 0510 08/22/20 0649 08/22/20 0649 08/22/20 1406 08/23/20 0524 08/23/20 1320 08/23/20 1725 08/24/20 0429  WBC  --    < > 11.7*  --  11.8*  --  9.0  --   --  7.2  --   --  6.6  CREATININE  --    < > 0.66   < > 0.63   < > 0.59*   < > 0.50* 0.59* 0.54* 0.62 0.50*  VANCOTROUGH 15  --   --   --   --   --   --   --   --   --   --   --   --    < > = values in this interval not displayed.    Estimated Creatinine Clearance: 123.4 mL/min (A) (by C-G formula based on SCr of 0.5 mg/dL (L)).    No Known Allergies  Antimicrobials this admission: 11/22 Vancomycin>> 11/26 11/22 Cefepime >> 11/23 11/24 Zosyn >>    Microbiology results:  11/22 BCx: NG  11/24 WCx (bone): Enterobacter cloacae   11/23 Resp. Cx: GPR, GPC  Thank you for allowing pharmacy to be a part of this patient's care.  Sherilyn Banker, PharmD Pharmacy Resident  08/24/2020 7:21 AM

## 2020-08-24 NOTE — Consult Note (Signed)
[place] Nurse wound follow up Wound type:Stage 4 sacral wound, debrided.  TO be discharged home.  Unable to secure [facility], but CSW is still working on this.  Daughters at bedside.  Both are nurses and are observing dressing change.  Was changed yesterday but we are changing today for teaching purposes.  Patient is in no pain and needs no analgesia, he reports.  Wound is cleansed and loose stool is cleaned as well.    Measurement: 5 cm x 3.5 cm x 4 cm with undermining from 9 to 3 o'clock, extends 5 cm  Wound BTD:VVOHY red. Bone palpable.  Drainage (amount, consistency, odor) moderate serosanguinous  No odor.  Periwound:scarring  Loose stools. Use barrier ring to protect and keep stool out.  Dressing procedure/placement/frequency: Daughters observe and agree they can complete this dressing change.  Demonstrated bridging including protecting intact skin with drape.  They verbalize understanding of rationale and process.  Discussed Mon/Wed/Fri or Tues/Thurs/Sat dressing changes and that patient will need to be taken to MD for periodic assessment.  Patient has a ramp and wheelchair and they use medical transport.  Patient has hospital bed and they feel they can perform care.  Will not follow at this time.  Please re-consult if needed.  Domenic Moras MSN, RN, FNP-BC CWON Wound, Ostomy, Continence Nurse Pager [phone]

## 2020-08-24 NOTE — TOC Progression Note (Addendum)
Transition of Care (TOC) - Progression Note    Patient Details  Name: Ryan Day MRN: [id number] Date of Birth: 07/16/1951  Transition of Care (TOC) CM/SW Contact  Meagan E Hagwood, LCSW Phone Number: 08/24/2020, 9:58 AM  Clinical Narrative:   This CSW is covering this case today. Updated by WOC RN Karen. Called CSW Sandra who reported she has called all in network Home Health agencies this week and there is no availability in this area. Sandra sent the list of agencies she called. WOC RN asked about wound assessment needs, CSW called [name] with [employer] to ask if patient will need weekly skilled assessments for his home wound vac that Sandra arranged, [name] reported insurance will require wound measurements every 2 weeks by an RN (not patient's daughters) or PCP.    2:15- Update from phone calls throughout the day:   CSW spoke with patient's wife and 2 daughters at bedside. Explained updates from Sandra. Patient has Blue Cross Blue Shield from [place] and Sandra has called all in network HH agencies on list from BCBS Representative that Sandra spoke with. Provided list to family.   Family reported they are not able to take patient to his PCP every two weeks for wound measurements.  CSW asked Penny with [facility] if patient's Palliative Care RN can do wound measurements, she checked and they can not.  Daughter Latosha asked about FMLA paperwork, informed her PCP will need to fill those out. She verbalized understanding.   Family asked about Outpatient PT since HH is not available. CSW spoke with TOC Supervisor Susan and then informed family that patient would not be Outpatient PT appropriate at this time due to wound vac. CSW provided Outpatient PT list via email for them to use in the future if needed.   Spoke to Pam with [facility] who reported they will be following patient for IV antibiotics. Pam was able to set up [facility] for weekly nursing visits for PICC  line and labs. Pam asked [facility] if their RN can do wound measurements, they agreed to do this. Updated [name] with [employer] (and asked him to deliver wound vac to room) and patient's daughter. Pam will need to set things up for home antibiotics and do training with patient's daughters, this will not be ready until tomorrow. Pam will reach out to Latosha to set up a time for IV teaching.   Patient on trach collar. Asked [name] and patient's daughter Latosha who confirmed they have trach supplies at home through [employer].   Provided all of the above updates to patient's daughter Latosha (who was with patient's wife and other daughter). Updated RN, WOC FNP, TOC Supervisor and MD, and asked for any other needs/suggestions. Will plan for discharge tomorrow.   Patient's wife asked other TOC member about home oxygen. CSW asked MD if this is needed.    3:00- Asked TOC Supervisor and MD to review plan to ensure needs are met. Per MD, patient on room air no home oxygen need.          Expected Discharge Plan and Services                                                 Social Determinants of Health (SDOH) Interventions    Readmission Risk Interventions No flowsheet data found.  

## 2020-08-24 NOTE — Progress Notes (Addendum)
PROGRESS NOTE    Ryan Day  VHQ:[id number] DOB: 1951/04/13 DOA: 08/14/2020 PCP: Valerie Roys, DO   Brief narrative  Ryan Day is a 69 y.o. 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  Patient also has a sacral pressure ulcer which had been concerning to patient's wife due to malodorous and draining pus.    Patient was seen by surgery during hospitalization and underwent debridement  on 08/16/2020.  Now has wound VAC.   Blood cultures are negative so far. Wound culture showed enterococci.  ID was consulted.  Recommended 6 weeks of IV Zosyn.  General surgery and ID has cleared the patient for discharge.  Pending home health/ wound VAC arrangements on discharge   Assessment & Plan:   Principal Problem:   Aspiration pneumonia ([place]) Active Problems:   Hyperlipidemia   Elevated alkaline phosphatase level   Spinal cord injury, cervical region, sequela (HCC)   Quadriplegia (HCC)   Neurogenic orthostatic hypotension (HCC)   Sacral decubitus ulcer, stage II (HCC)   Pneumonia   Hypokalemia   Hypoalbuminemia   Leukocytosis   Decubitus ulcer of sacral region, stage 4 (HCC)  Hypernatremia: Sodium of 154 yesterday and on repeat today it has trended down to 147.  Continue D5 water at 100 mL/h.  Continue water flushes through the tube feeding.  Aspiration pneumonia: History of tracheostomy. Treated for aspiration pneumonia.  Improved chest x-ray.  Plan is to continue Zosyn IV on discharge for osteomyelitis.   Increased airway secretion/Hx of mucus plugging  Was on a scopolamine patch at home.  Has been discontinued on  11/23.  Respiratory therapy on board.  Will need continued trach suctioning.  Will need home respiratory care care on discharge.  Sepsis secondary to Large left sacral decubitus ulcer with underlying osteomyelitis Status post debridement for infected ulcer on 08/16/2020 by general surgery.  Continue wound VAC.  Wound culture with Enterobacter.  ID recommended 6 weeks of IV Zosyn on discharge.  Wound VAC / wound care arrangements at home in process.  Hypokalemia Potassium of 3.2 today.  We will continue to replenish via PEG tube.  On 40 mEq twice daily  PEG-dependent Tube feeding at home.  Home health to be arranged on discharge.  Hypotension On midodrine and Florinef.  Blood pressure seems to be stable at this time  Cervical cord injury with quadriplegia.   Patient had C5 vertebral body fracture, C3-C6 cord compression with edema status post C3-C6 laminectomy with fusion and repair of the dural tear in 04/2020 at [facility].  Continue supportive care  GERD Continue famotidine   Depression Continue Prozac  Iron deficiency anemia.  Continue iron supplements   DVT prophylaxis: On Xarelto  Code Status: Full code   Family Communication:  None today.  Spoke with the patient's family at bedside yesterday  Status: inpatient  Disposition:   The patient is from: home Anticipated d/c is to: home Anticipated d/c date is: Likely tomorrow pending arrangements, ongoing improvement of sodium level.  I have signed wound VAC orders. Patient currently is medically stable to d/c    Subjective Today, patient was seen and examined at bedside.  Denies any nausea, vomiting  abdominal pain but has mild shortness of breath and cough.  Has some secretions.   Objective: Vitals:   08/24/20 0056 08/24/20 0554 08/24/20 0731 08/24/20 1105  BP: 130/66 140/79 138/82 108/63  Pulse: 75 (!) 58 65 63  Resp: 19 19 16 17   Temp: 97.8 F (36.6 C) (!) 97.5 F (36.4 C) 98.3 F (36.8 C) 97.6 F (36.4 C)    TempSrc:      SpO2: 100% 100% 100% 99%  Weight:      Height:        Intake/Output Summary (Last 24 hours) at 08/24/2020 1537 Last data filed at 08/24/2020 0600 Gross per 24 hour  Intake 1277 ml  Output 1550 ml  Net -273 ml   Filed Weights   08/15/20 0505  Weight: 113.1 kg   Body mass index is 29.57 kg/m.   Examination:  General:  Average built, not in obvious distress, status post trach and PEG HENT:   No scleral pallor or icterus noted. Oral mucosa is moist.  Chest: Mild rhonchi noted, coarse breath sounds.  CVS: S1 &S2 heard. No murmur.  Regular rate and rhythm. Abdomen: Soft, nontender, nondistended.  PEG tube in place, Foley catheter in place bowel sounds are heard.   Extremities: No cyanosis, clubbing or edema.  Peripheral pulses are palpable.  Right upper extremity PICC line in place Psych: Alert, awake and oriented, normal mood CNS:  No cranial nerve deficits.  Quadriplegic. Skin: Warm and dry.  Sacral decubitus ulceration   Data Reviewed: I have personally reviewed following labs and imaging studies  CBC: Recent Labs  Lab 08/20/20 0558 08/21/20 0510 08/22/20 0649 08/23/20 0524 08/24/20 0429  WBC 11.7* 11.8* 9.0 7.2 6.6  NEUTROABS 9.2* 9.3* 7.0 5.1 4.6  HGB 8.7* 8.7* 7.9* 7.8* 7.8*  HCT 28.3* 28.0* 25.4* 25.4* 24.8*  MCV 90.1 90.9 92.7 92.4 91.2  PLT 288 248 241 239 283   Basic Metabolic Panel: Recent Labs  Lab 08/18/20 0357 08/19/20 0418 08/22/20 0649 08/22/20 0649 08/22/20 1406 08/22/20 1406 08/22/20 2028 08/23/20 0524 08/23/20 1320 08/23/20 1725 08/24/20 0429  NA 144   < > 154*   < > 154*   < > 154* 154* 152* 150* 147*  K 3.5   < > 3.1*   < > 3.8  --   --  3.6 3.5 3.2* 3.2*  CL 104   < > 115*   < > 119*  --   --  117* 116* 115* 112*  CO2 32   < > 30   < > 27  --   --  28 26 27 27   GLUCOSE 134*   < > 174*   < > 164*  --   --  111* 119* 186* 106*  BUN 24*   < > 26*   < > 24*  --   --  19 18 18 16   CREATININE 0.55*   < > 0.59*   < > 0.50*   --   --  0.59* 0.54* 0.62 0.50*  CALCIUM 8.9   < > 9.6   < > 9.5  --   --  9.9 9.6 9.3 9.4  MG 2.1  --  2.3  --   --   --   --   --   --   --  2.1  PHOS  --   --   --   --   --   --   --   --   --   --  2.5   < > = values in this interval not displayed.   GFR: Estimated Creatinine Clearance: 123.4 mL/min (A) (by C-G formula based on SCr of 0.5 mg/dL (L)). Liver Function Tests: Recent Labs  Lab 08/19/20 0418 08/20/20 0558 08/24/20 0429  AST 28 20 22   ALT 49* 38 30  ALKPHOS 125 121 96  BILITOT 0.5 0.7 0.6  PROT 7.5 7.9 6.7  ALBUMIN 2.5* 2.5* 2.2*   No results for input(s): LIPASE, AMYLASE in the last 168 hours. No results for input(s): AMMONIA in the last 168 hours. Coagulation Profile: No results for input(s): INR, PROTIME in the last 168 hours. Cardiac Enzymes: No results for input(s): CKTOTAL, CKMB, CKMBINDEX, TROPONINI in the last 168 hours. BNP (last 3 results) No results for input(s): PROBNP in the last 8760 hours. HbA1C: No results for input(s): HGBA1C in the last 72 hours. CBG: Recent Labs  Lab 08/24/20 1106  GLUCAP 162*   Lipid Profile: No results for input(s): CHOL, HDL, LDLCALC, TRIG, CHOLHDL, LDLDIRECT in the last 72 hours. Thyroid Function Tests: No results for input(s): TSH, T4TOTAL, FREET4, T3FREE, THYROIDAB in the last 72 hours. Anemia Panel: No results for input(s): VITAMINB12, FOLATE, FERRITIN, TIBC, IRON, RETICCTPCT in the last 72 hours. Sepsis Labs: No results for input(s): PROCALCITON, LATICACIDVEN in the last 168 hours.  Recent Results (from the past 240 hour(s))  Resp Panel by RT-PCR (Flu A&B, Covid) Nasopharyngeal Swab     Status: None   Collection Time: 08/14/20  4:51 PM   Specimen: Nasopharyngeal Swab; Nasopharyngeal(NP) swabs in vial transport medium  Result Value Ref Range Status   SARS Coronavirus 2 by RT PCR NEGATIVE NEGATIVE Final    Comment: (NOTE) SARS-CoV-2 target nucleic acids are NOT DETECTED.  The SARS-CoV-2 RNA is generally  detectable in upper respiratory specimens during the acute phase of infection. The lowest concentration of SARS-CoV-2 viral copies this assay can detect is 138 copies/mL. A negative result does not preclude SARS-Cov-2 infection and should not be used as the sole basis for treatment or other patient management decisions. A negative result may occur with  improper specimen collection/handling, submission of specimen other than nasopharyngeal swab, presence of viral mutation(s) within the areas targeted by this assay, and inadequate number of viral copies(<138 copies/mL). A negative result must be combined with clinical observations, patient history, and epidemiological information. The expected result is Negative.  Fact Sheet for Patients:  EntrepreneurPulse.com.au  Fact Sheet for Healthcare Providers:  IncredibleEmployment.be  This test is no t yet approved or cleared by the [place] FDA and  has been authorized for detection and/or diagnosis of SARS-CoV-2 by FDA under an Emergency Use Authorization (EUA). This EUA will remain  in effect (meaning this test can be used) for the duration of the COVID-19 declaration under Section 564(b)(1) of the Act, 21 U.S.C.section 360bbb-3(b)(1), unless the authorization is terminated  or revoked sooner.       Influenza A by PCR NEGATIVE NEGATIVE Final   Influenza B by PCR NEGATIVE NEGATIVE Final    Comment: (NOTE) The Xpert Xpress SARS-CoV-2/FLU/RSV plus assay is intended as an aid in the diagnosis of influenza from Nasopharyngeal swab specimens and should not be used as a sole basis for treatment. Nasal washings and aspirates are unacceptable for Xpert Xpress SARS-CoV-2/FLU/RSV testing.  Fact Sheet for Patients: EntrepreneurPulse.com.au  Fact Sheet for Healthcare Providers: IncredibleEmployment.be  This test is not yet approved or cleared by the [place] FDA  and has been authorized for detection and/or diagnosis  of SARS-CoV-2 by FDA under an Emergency Use Authorization (EUA). This EUA will remain in effect (meaning this test can be used) for the duration of the COVID-19 declaration under Section 564(b)(1) of the Act, 21 U.S.C. section 360bbb-3(b)(1), unless the authorization is terminated or revoked.  Performed at [facility], [address] [place], [place] [address]   Culture, blood (routine x 2)     Status: None   Collection Time: 08/14/20  6:44 PM   Specimen: BLOOD  Result Value Ref Range Status   Specimen Description BLOOD LEFT ANTECUBITAL  Final   Special Requests   Final    BOTTLES DRAWN AEROBIC AND ANAEROBIC Blood Culture results may not be optimal due to an excessive volume of blood received in culture bottles   Culture   Final    NO GROWTH 5 DAYS Performed at [facility], [address], [place], [place] [address]    Report Status 08/19/2020 FINAL  Final  Culture, blood (routine x 2)     Status: None   Collection Time: 08/14/20  6:45 PM   Specimen: BLOOD  Result Value Ref Range Status   Specimen Description BLOOD BLOOD RIGHT FOREARM  Final   Special Requests   Final    BOTTLES DRAWN AEROBIC AND ANAEROBIC Blood Culture adequate volume   Culture   Final    NO GROWTH 5 DAYS Performed at [facility], [address] [place], [place] [address]    Report Status 08/19/2020 FINAL  Final  Culture, respiratory     Status: None   Collection Time: 08/15/20  3:20 AM   Specimen: SPU  Result Value Ref Range Status   Specimen Description   Final    SPUTUM Performed at [facility], [address] [place], [place] [address]    Special Requests   Final    NONE Performed at [facility], [address] [place], [place] [address]    Gram Stain   Final    ABUNDANT WBC PRESENT,BOTH PMN AND MONONUCLEAR FEW GRAM POSITIVE RODS RARE GRAM POSITIVE COCCI IN PAIRS    Culture   Final     RARE Normal respiratory flora-no Staph aureus or Pseudomonas seen Performed at [facility], [place] [address] [place], [place] [address]    Report Status 08/17/2020 FINAL  Final  Aerobic/Anaerobic Culture (surgical/deep wound)     Status: None   Collection Time: 08/16/20  9:51 AM   Specimen: PATH Other; Tissue  Result Value Ref Range Status   Specimen Description TISSUE SACRAL  Final   Special Requests BONE  Final   Gram Stain   Final    RARE WBC PRESENT,BOTH PMN AND MONONUCLEAR NO ORGANISMS SEEN    Culture   Final    RARE ENTEROBACTER CLOACAE FEW STREPTOCOCCUS CONSTELLATUS FEW BACTEROIDES THETAIOTAOMICRON BETA LACTAMASE POSITIVE Performed at [facility], [place] [address], [place], [place] [address]    Report Status 08/19/2020 FINAL  Final   Organism ID, Bacteria ENTEROBACTER CLOACAE  Final      Susceptibility   Enterobacter cloacae - MIC*    CEFAZOLIN >=64 RESISTANT Resistant     CEFEPIME <=0.12 SENSITIVE Sensitive     CEFTAZIDIME <=1 SENSITIVE Sensitive     CIPROFLOXACIN <=0.25 SENSITIVE Sensitive     GENTAMICIN <=1 SENSITIVE Sensitive     IMIPENEM 0.5 SENSITIVE Sensitive     TRIMETH/SULFA <=20 SENSITIVE Sensitive     PIP/TAZO <=4 SENSITIVE Sensitive     *  RARE ENTEROBACTER CLOACAE  Aerobic/Anaerobic Culture (surgical/deep wound)     Status: Abnormal   Collection Time: 08/16/20  9:52 AM   Specimen: PATH Other; Tissue  Result Value Ref Range Status   Specimen Description   Final    ABSCESS Performed at [facility], [address], [place], [place] [address]    Special Requests   Final    SACRAL DECUBITUS ULCER Performed at [facility], [address] [place], [place] [address]    Gram Stain NO WBC SEEN NO ORGANISMS SEEN NO ORGANISMS SEEN   Final   Culture (A)  Final    MULTIPLE ORGANISMS PRESENT, NONE PREDOMINANT FEW BACTEROIDES THETAIOTAOMICRON BETA LACTAMASE POSITIVE Performed at [facility], [place] [address],  [place],  [address]    Report Status 08/19/2020 FINAL  Final      Radiology Studies: No results found.   Scheduled Meds: . bisacodyl  10 mg Rectal QHS  . Chlorhexidine Gluconate Cloth  6 each Topical Daily  . famotidine  20 mg Per Tube Daily  . feeding supplement (OSMOLITE 1.5 CAL)  237 mL Per Tube Q3H  . feeding supplement (PROSource TF)  45 mL Per Tube BID  . ferrous sulfate  78 mg Oral Q breakfast  . fludrocortisone  0.2 mg Per Tube Daily  . FLUoxetine  40 mg Per Tube Daily  . free water  200 mL Per Tube Q8H  . free water  300 mL Per Tube Q4H  . influenza vaccine adjuvanted  0.5 mL Intramuscular Tomorrow-1000  . midodrine  10 mg Per Tube TID WC  . nutrition supplement (JUVEN)  1 packet Per Tube BID BM  . potassium chloride  40 mEq Per Tube BID  . rivaroxaban  10 mg Per Tube Daily  . sodium chloride flush  10-40 mL Intracatheter Q12H   Continuous Infusions: . piperacillin-tazobactam (ZOSYN)  IV 3.375 g (08/24/20 1233)     LOS: 10 days   Flora Lipps, MD [facility] If 7PM-7AM, please contact night-coverage 08/24/2020, 3:37 PM

## 2020-08-25 LAB — BASIC METABOLIC PANEL
Anion gap: 9 (ref 5–15)
BUN: 18 mg/dL (ref 8–23)
CO2: 27 mmol/L (ref 22–32)
Calcium: 9.4 mg/dL (ref 8.9–10.3)
Chloride: 110 mmol/L (ref 98–111)
Creatinine, Ser: 0.63 mg/dL (ref 0.61–1.24)
GFR, Estimated: >60 mL/min (ref >60)
Glucose, Bld: 94 mg/dL (ref 70–99)
Potassium: 3.7 mmol/L (ref 3.5–5.1)
Sodium: 146 mmol/L — ABNORMAL HIGH (ref 135–145)

## 2020-08-25 LAB — MAGNESIUM: Magnesium: 2.2 mg/dL (ref 1.7–2.4)

## 2020-08-25 MED ORDER — PIPERACILLIN-TAZOBACTAM IV (FOR PTA / DISCHARGE USE ONLY): 13.5000 g | INTRAVENOUS | 0 refills | Status: AC

## 2020-08-25 NOTE — Progress Notes (Signed)
Pharmacy Antibiotic Note  Ryan Day is a 69 y.o. male admitted on 08/14/2020 with pneumonia. Respiratory culture with GPR and GPC. Pharmacy has been consulted for Zosyn dosing. Patient was previously on Vancomycin - last dose was 11/26. Patient has medical history significant forhypotension, dyslipidemia, quadriplegics/pfall resulting in cervical spinal cord injury -August 2021 (admitted and treated at [facility] in [facility] for 2 months;rehab at [facility]), trach and PEG tube dependent. Patient also has Stage IV sacral decubitus s/p debridement 11/24; acute osteomyelitis of the sacral bone with enterobacter cloacae in culture resistant to cefazolin. WBC improving, pt is afebrile, Scr stable.    Plan:  Continue Zosyn 3.375g IV Q8H extended infusion  Monitor renal function and adjust dose as clinically indicated ID planning for 6 weeks of antibiotics (09/26/20) - OPAT orders are in  Height: 6\' 5"  (195.6 cm) Weight: 113.1 kg (249 lb 5.4 oz) IBW/kg (Calculated) : 89.1  Temp (24hrs), Avg:98.2 F (36.8 C), Min:97.4 F (36.3 C), Max:99 F (37.2 C)  Recent Labs  Lab 08/18/20 1315 08/19/20 0418 08/20/20 0558 08/20/20 0558 08/21/20 0510 08/21/20 0510 08/22/20 0649 08/22/20 1406 08/23/20 0524 08/23/20 1320 08/23/20 1725 08/24/20 0429 08/25/20 0519  WBC  --    < > 11.7*  --  11.8*  --  9.0  --  7.2  --   --  6.6  --   CREATININE  --    < > 0.66   < > 0.63   < > 0.59*   < > 0.59* 0.54* 0.62 0.50* 0.63  VANCOTROUGH 15  --   --   --   --   --   --   --   --   --   --   --   --    < > = values in this interval not displayed.    Estimated Creatinine Clearance: 123.4 mL/min (by C-G formula based on SCr of 0.63 mg/dL).    No Known Allergies  Antimicrobials this admission: 11/22 Vancomycin>> 11/26 11/22 Cefepime >> 11/23 11/24 Zosyn >>    Microbiology results:  11/22 BCx: NG  11/24 WCx (bone): Enterobacter cloacae   11/23 Resp. Cx: GPR, GPC  Thank you for allowing pharmacy to be a  part of this patient's care.  Sherilyn Banker, PharmD Pharmacy Resident  08/25/2020 8:51 AM

## 2020-08-25 NOTE — Progress Notes (Signed)
Pt discharged to home.  PICC line flushed and left in place.  AVS given to pt and wife and explained with no further questions.  All belongings at bedside Taken with pt. Pt transported by ambulance.

## 2020-08-25 NOTE — TOC Transition Note (Addendum)
Transition of Care [facility]) - CM/SW Discharge Note   Patient Details  Name: Ryan Day MRN: [id number] Date of Birth: 1950-10-20  [facility] CM/SW Contact:  Magnus Ivan, LCSW Phone Number: 08/25/2020, 1:30 PM   Clinical Narrative:   Patient to discharge home today.   Wife requested EMS transport after 3 pm. Called [facility] as they can schedule pick up time. Answered Beverely Low' questions and confirmed they can take patient's weight and home set up (ramp and lots of space per wife). Only opening for 5 pm, informed wife who is ok with this. Confirmed home address. Face Sheet and EMS paperwork placed in Discharge Packet.   Home infusions arranged with [facility] Representative Pam who has already done teaching with patient's daughters. Pam arranged for [facility] RN to do weekly visits for labs, PICC line, and they agreed to do wound measurements every 2 weeks as required by [employer] for insurance to continue to cover wound vac. [employer] and [facility] have consulted on the plan for this.  WOC RN did wound care teaching with patient's 2 daughters who are RNs and will be providing this at home.  Wound vac arranged through [place] and has already been delivered to the room. Thedore Mins and patient's daughter [name] confirmed patient has trach supplies at home and a humidification device.  Per RN and MD, patient does not need home oxygen. MD recommended PCP follow up for evaluation for sleep apnea.   [facility] follows patient at home and has been informed of discharge today.   Reviewed plan with patient's wife, daughter [name], RN, and MD. No other needs identified prior to discharge. Please re consult TOC if additional needs arise.     Final next level of care: Home/Self Care Barriers to Discharge: Barriers Resolved   Patient Goals and CMS Choice Patient states their goals for this hospitalization and ongoing  recovery are:: home CMS Medicare.gov Compare Post Acute Care list provided to:: Patient Choice offered to / list presented to : Patient, Spouse, Adult Children  Discharge Placement                Patient to be transferred to facility by: [facility] Name of family member notified: wife and daughter [name] Patient and family notified of of transfer: 08/25/20  Discharge Plan and Services                DME Arranged: Vac DME Agency: [employer] Date DME Agency Contacted: 08/24/20   Representative spoke with at DME Agency: Donalee Citrin [facility] Arranged: RN [place] Agency:  Orville Govern)     Representative spoke with at [place]: [facility] arranged by [facility] Representative Carolynn Sayers  Social Determinants of Health (SDOH) Interventions     Readmission Risk Interventions No flowsheet data found.

## 2020-08-27 NOTE — Discharge Summary (Signed)
Ryan Day HTM:[id number] DOB: 26-Sep-1950 DOA: 08/14/2020  PCP: Valerie Roys, DO  Admit date: 08/14/2020  Discharge date: 08/27/2020  Admitted From: Home   disposition: Home with home health   Recommendations for Outpatient Follow-up:   Follow up with PCP in 1-2 weeks  Home Health: Face-to-face RN, PT, wound care, home infusion Equipment/Devices: They have already been set up with all the equipment and devices they need given C4 quadriplegia Consultations: Infectious disease Discharge Condition: Improved CODE STATUS: Full Diet Recommendation: Heart Healthy low-sodium  Diet Order            Diet - low sodium heart healthy                  Chief Complaint  Patient presents with  . 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 Patient also has a sacral pressure ulcer which had been concerning to patient's wife due to malodorous and draining pus.    Patient was seen by surgery during hospitalization and underwent debridement  on 08/16/2020.  Now has wound VAC.   Blood cultures are negative so far. Wound culture showed enterococci.  ID was consulted.  Recommended 6 weeks  of IV Zosyn.  General surgery and ID has cleared the patient for discharge.  Pending home health/ wound VAC arrangements on discharge                                                                  Hospital Course   Osteomyelitis of left sacral decubitus ulcer Status post debridement for infected ulcer on 08/16/2020 by general surgery.   Continue wound VAC. Wound culture with Enterobacter.  Continue IV Zosyn for 6 weeks per ID recommendation  Aspiration pneumonia:  Treated for aspiration pneumonia.  Improved chest x-ray.  Plan is to continue Zosyn IV on discharge for osteomyelitis.   Hypernatremia:   Normalized with increased water flushes and tube feeding real   Increased airway secretion/Hx of mucus plugging  Continue frequent suctioning.   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  Home health to be arranged on discharge.  Hypotension On midodrine and Florinef.  Blood pressure  seems to be stable at this time  Cervical cord injury with quadriplegia.   Patienthad C5 vertebral body fracture, C3-C6 cord compression with edema status post C3-C6 laminectomy with fusion and repair of the dural tear in 04/2020 at [facility].  Continue supportive care  GERD Continue famotidine   Depression Continue Prozac  Iron deficiency anemia.  Continue iron supplements    Discharge diagnosis     Principal Problem:   Aspiration pneumonia (HCC) Active Problems:   Hyperlipidemia   Elevated alkaline phosphatase level   Spinal cord injury, cervical region, sequela (HCC)   Quadriplegia (HCC)   Neurogenic orthostatic hypotension (HCC)   Sacral decubitus ulcer, stage II ([place])   Pneumonia   Hypokalemia   Hypoalbuminemia   Leukocytosis   Decubitus ulcer of sacral region, stage 4 [facility])    Discharge instructions    Discharge Instructions    [facility] pharmacist to adjust dose for Vancomycin, Aminoglycosides and other anti-infective therapies as  requested by physician.   Complete by: As directed    [facility] to provide Cath Flo 49m   Complete by: As directed    Administer for PICC line occlusion and as ordered by physician for other access device issues.   Anaphylaxis Kit: Provided to treat any anaphylactic reaction to the medication being provided to the patient if First Dose or when requested by physician   Complete by: As directed    Epinephrine 136mml vial / amp: Administer 0.58m105m0.58ml28mubcutaneously once for moderate to severe anaphylaxis, nurse to call physician and pharmacy when reaction occurs and call 911 if needed for immediate care   Diphenhydramine 50mg6mIV vial: Administer 25-50mg 68mM PRN for first dose reaction, rash, itching, mild reaction, nurse to call physician and pharmacy when reaction occurs   Sodium Chloride 0.9% NS 500ml I258mdminister if needed for hypovolemic blood pressure drop or as ordered by physician after call to physician with anaphylactic reaction   Change dressing on IV access line weekly and PRN   Complete by: As directed    Diet - low sodium heart healthy   Complete by: As directed    Discharge instructions   Complete by: As directed    1. Make sure to increase his free water by 200 cc/day as we discussed.  2. He will need a BMP (blood test) checked in 1 week by his PCP. They will need to look at his Sodium and Potassium to make sure they are still doing well. Make sure to make an appointment with your PCP for this.  3. Discuss your concerns about low O2 at night with your PCP. In particular, see if the O2 gets better after you suction him and tell your PCP>   Discharge wound care:   Complete by: As directed    As per wound care instructions.   Flush IV access with Sodium Chloride 0.9% and Heparin 10 units/ml or 100 units/ml   Complete by: As directed    Home infusion instructions - [facility]   Complete by: As directed    Instructions: Flush IV access with Sodium  Chloride 0.9% and Heparin 10units/ml or 100units/ml   Change dressing on IV access line: Weekly and PRN   Instructions Cath Flo 2mg: Ad658mister for PICC Line occlusion and as ordered by physician for other access device   [facility] pharmacist to adjust dose for: Vancomycin, Aminoglycosides and other anti-infective therapies as requested by physician   Increase activity slowly   Complete  by: As directed    Method of administration may be changed at the discretion of home infusion pharmacist based upon assessment of the patient and/or caregiver's ability to self-administer the medication ordered   Complete by: As directed       Discharge Medications   Allergies as of 08/25/2020   No Known Allergies     Medication List    TAKE these medications   acetaminophen 160 MG/5ML solution Commonly known as: TYLENOL Place 10-20 mLs (320-640 mg total) into feeding tube every 4 (four) hours as needed for mild pain.   ascorbic acid 500 MG tablet Commonly known as: VITAMIN C Place 1 tablet (500 mg total) into feeding tube 2 (two) times daily.   Baclofen 5 MG Tabs Place 5 mg into feeding tube 3 (three) times daily.   bisacodyl 10 MG suppository Commonly known as: DULCOLAX Place 1 suppository (10 mg total) rectally at bedtime.   chlorhexidine 0.12 % solution Commonly known as: PERIDEX 15 mLs by Mouth Rinse route 2 (two) times daily.   collagenase ointment Commonly known as: SANTYL Apply topically daily.   famotidine 20 MG tablet Commonly known as: PEPCID Place 1 tablet (20 mg total) into feeding tube daily.   feeding supplement (OSMOLITE 1.5 CAL) Liqd Place 474 mLs into feeding tube 4 (four) times daily.   nutrition supplement (JUVEN) Pack Place 1 packet into feeding tube 2 (two) times daily between meals.   feeding supplement (PROSource TF) liquid Place 45 mLs into feeding tube 2 (two) times daily.   ferrous sulfate 300 (60 Fe) MG/5ML syrup Place 1.3 mLs (78 mg  total) into feeding tube daily.   fludrocortisone 0.1 MG tablet Commonly known as: FLORINEF Place 2 tablets (0.2 mg total) into feeding tube daily.   FLUoxetine 20 MG/5ML solution Commonly known as: PROZAC Place 10 mLs (40 mg total) into feeding tube daily.   free water Soln Place 400 mLs into feeding tube every 4 (four) hours. What changed: how much to take   guaiFENesin 200 MG tablet Place 2 tablets (400 mg total) into feeding tube every 6 (six) hours.   lidocaine 5 % Commonly known as: LIDODERM Place 1 patch onto the skin daily. Remove & Discard patch within 12 hours or as directed by MD   midodrine 10 MG tablet Commonly known as: PROAMATINE Place 1 tablet (10 mg total) into feeding tube 3 (three) times daily with meals.   mouth rinse Liqd solution 15 mLs by Mouth Rinse route 2 times daily at 12 noon and 4 pm.   multivitamin Liqd Place 5 mLs into feeding tube daily.   ondansetron 4 MG tablet Commonly known as: ZOFRAN Place 1 tablet (4 mg total) into feeding tube every 8 (eight) hours as needed for nausea, vomiting or refractory nausea / vomiting.   piperacillin-tazobactam  IVPB Commonly known as: ZOSYN Inject 13.5 g into the vein continuous. Infuse piperacillin/tazobactam 13.5gm over 24h as continuous infusion  Indication:  Sacral decubitus and osteomyelitis First Dose: Yes Last Day of Therapy:  09/26/2020 Labs - Once weekly:  CBC/D and CMP, Labs - Every other week:  ESR  Method of administration: Elastomeric (Continuous infusion) Method of administration may be changed at the discretion of home infusion pharmacist based upon assessment of the patient and/or caregiver's ability to self-administer the medication ordered.   polycarbophil 625 MG tablet Commonly known as: FIBERCON Place 1 tablet (625 mg total) into feeding tube daily.   rivaroxaban 10 MG Tabs tablet Commonly known as: Alveda Reasons  Take one tablet per tube daily   saccharomyces boulardii 250 MG  capsule Commonly known as: FLORASTOR Place 1 capsule (250 mg total) into feeding tube 2 (two) times daily.   scopolamine 1 MG/3DAYS Commonly known as: TRANSDERM-SCOP Place 1 patch (1.5 mg total) onto the skin every 3 (three) days.   sennosides 8.8 MG/5ML syrup Commonly known as: SENOKOT Place 5 mLs into feeding tube daily at 6 (six) AM.   simethicone 40 MG/0.6ML drops Commonly known as: MYLICON Place 1.2 mLs (80 mg total) into feeding tube 4 (four) times daily.   traZODone 50 MG tablet Commonly known as: DESYREL Place 1 tablet (50 mg total) into feeding tube at bedtime.   zinc sulfate 220 (50 Zn) MG capsule Place 1 capsule (220 mg total) into feeding tube daily.            Discharge Care Instructions  (From admission, onward)         Start     Ordered   08/25/20 0000  Change dressing on IV access line weekly and PRN  (Home infusion instructions - [facility] )        08/25/20 1247   08/25/20 0000  Discharge wound care:       Comments: As per wound care instructions.   08/25/20 1247           Follow-up Information    Johnson, Megan P, DO. Schedule an appointment as soon as possible for a visit in 1 week(s).   Specialty: Family Medicine Why: Patient will need BMP to check sodium and potassium.  Contact information: [place] [address] [phone]               Major procedures and Radiology Reports - PLEASE review detailed and final reports thoroughly  -       CT PELVIS W CONTRAST  Result Date: 08/15/2020 CLINICAL DATA:  Sacral decubitus ulceration with suspected abscess. EXAM: CT PELVIS WITH CONTRAST TECHNIQUE: Multidetector CT imaging of the pelvis was performed using the standard protocol following the bolus administration of intravenous contrast. CONTRAST:  160m OMNIPAQUE IOHEXOL 300 MG/ML  SOLN COMPARISON:  None FINDINGS: Urinary Tract: Foley catheter within the urinary bladder. Small amount of gas in the urinary bladder. No  distal ureteral dilation. Bowel: Colonic diverticulosis. No acute gastrointestinal process to the extent evaluated. Visualized portions of the appendix are normal. Vascular/Lymphatic: Calcified atheromatous plaque in the abdominal aorta distally and tracking into the iliac vessels. Mildly enlarged LEFT external iliac lymph nodes likely reactive. Reproductive:  Prostate unremarkable by CT. Other:  No ascites. Musculoskeletal: Periosteal reaction along the sacrum at the site of a large LEFT Sacral decubitus ulcer. Exposed bone is likely evident based on the appearance on CT along the cephalad margin of the ulceration with the defect just below this level within the soft tissues. Some gas tracking in the subcutaneous fat overlying the LEFT gluteal region with packing material in this area as well. Area measures approximately 6.2 by 4.5 cm in the coronal plane. Gas tracking laterally approximately 2-3 cm away from the site of ulceration best seen on image 44 of series 2. 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 IMPRESSION: 1. 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 2. No sign of focal fluid collection in the area but with some gas tracking away from the site in the subcutaneous fat just to the LEFT of the ulceration. This could relate to debridement. Correlate  with any clinical evidence of aggressive soft tissue infection. 3. Aortic atherosclerosis. Aortic Atherosclerosis (ICD10-I70.0). Electronically Signed   By: Zetta Bills M.D.   On: 08/15/2020 17:28   DG Chest Port 1 View  Result Date: 08/18/2020 CLINICAL DATA:  Ronchi at both lung bases. EXAM: PORTABLE CHEST 1 VIEW COMPARISON:  Chest radiograph 08/14/2020. FINDINGS: Unchanged position of a tracheostomy tube which terminates at the level of the clavicular heads. Heart size within normal limits. Shallow inspiration radiograph. Significant interval improvement in aeration of the right lung  base with only subtle persistent opacity within the medial right lower lobe. Unchanged minimal opacity within the lateral left lung base with an appearance most suggestive of atelectasis. No sizable pleural effusion or evidence of pneumothorax. No acute bony abnormality identified. Fusion hardware within the visualized cervical spine. IMPRESSION: Shallow inspiration radiograph. Significant interval improvement in aeration of the right lung base with only subtle persistent opacity in the medial right lower lobe. Findings may reflect improving atelectasis and/or pneumonia. Unchanged subtle opacity within the lateral left lung base with an appearance most suggestive of atelectasis. Electronically Signed   By: Kellie Simmering DO   On: 08/18/2020 11:27   DG Chest Portable 1 View  Result Date: 08/14/2020 CLINICAL DATA:  Worsening short of breath EXAM: PORTABLE CHEST 1 VIEW COMPARISON:  08/04/2020, 08/05/2020 07/11/2020, 07/25/2020 FINDINGS: Tracheostomy tube remains in place. Surgical hardware in the cervical spine. Partial consolidation in the right base slightly increased compared with 08/05/2020. Stable cardiomediastinal silhouette. No pneumothorax. IMPRESSION: Partial consolidation at the right base, slightly increased compared to 08/05/2020, possible pneumonia. Electronically Signed   By: Donavan Foil M.D.   On: 08/14/2020 17:07   DG Chest Port 1 View  Result Date: 08/05/2020 CLINICAL DATA:  Cough and shortness of breath EXAM: PORTABLE CHEST 1 VIEW COMPARISON:  August 04, 2020 FINDINGS: Tracheostomy catheter tip is 6.0 cm above the carina. No pneumothorax. Questionable pneumomediastinum slightly to the right of midline there is bibasilar atelectasis. There is persistent airspace consolidation in portions of the right mid and lower lung regions. Heart is mildly enlarged, stable, with pulmonary vascularity within normal limits. No adenopathy. No bone lesions. IMPRESSION: Tracheostomy as described without  pneumothorax. Question mild pneumomediastinum, similar to 1 day prior. Persistent consolidation in portions of the right middle and lower lobes with bibasilar atelectasis. Stable cardiac prominence. Electronically Signed   By: Lowella Grip III M.D.   On: 08/05/2020 10:07   DG Chest Port 1 View  Result Date: 08/04/2020 CLINICAL DATA:  Shortness of breath EXAM: PORTABLE CHEST 1 VIEW COMPARISON:  August 03, 2020 FINDINGS: Tracheostomy catheter tip is 7.1 cm above the carina. No pneumothorax. Apparent pneumomediastinum immediately to the right of the trachea. There is persistent consolidation with volume loss and portions of the right middle and lower lobes. There is slight left base atelectasis. Heart is prominent with pulmonary vascularity normal. No adenopathy. No bone lesions. IMPRESSION: Tracheostomy as described without pneumothorax. Apparent pneumomediastinum immediately to the right of the trachea of uncertain etiology. Persistent significant volume loss right middle and lower lobe regions with consolidation in these areas. Mild left base atelectasis. No new opacity evident. Stable cardiac silhouette. These results will be called to the ordering clinician or representative by the Radiologist Assistant, and communication documented in the PACS or [employer]. Electronically Signed   By: Lowella Grip III M.D.   On: 08/04/2020 08:24   DG Chest Port 1 View  Result Date: 08/03/2020 CLINICAL DATA:  Shortness of breath  EXAM: PORTABLE CHEST 1 VIEW COMPARISON:  Yesterday FINDINGS: Tracheostomy tube in place. Collapse of the right lower lobe and possibly middle lobe with obscured diaphragm. Hazy density elsewhere is improved. No visible effusion or pneumothorax. High-density in the left upper quadrant which is enteric. IMPRESSION: Interval lobar collapse at the right base. Electronically Signed   By: Monte Fantasia M.D.   On: 08/03/2020 05:21   DG Chest Port 1 View  Result Date:  08/02/2020 CLINICAL DATA:  Aspiration pneumonia EXAM: PORTABLE CHEST 1 VIEW COMPARISON:  Yesterday FINDINGS: Improved inflation of the right lung. There is still diffuse hazy and interstitial opacity. Tracheostomy tube in place. Stable heart size and mediastinal contours. Bulky endplate spurring IMPRESSION: 1. Improved inflation of the right lung. 2. Diffuse interstitial and airspace opacity, question superimposed edema. Electronically Signed   By: Monte Fantasia M.D.   On: 08/02/2020 05:00   DG Chest Port 1 View  Result Date: 08/01/2020 CLINICAL DATA:  Aspiration pneumonia.  Hypoxia and tachypnea. EXAM: PORTABLE CHEST 1 VIEW COMPARISON:  08/01/2020 FINDINGS: Progressive collapse on the right with increased midline shift to the right. Small right effusion. Left lung remains clear.  Tracheostomy remains in good position. IMPRESSION: Progressive airspace disease and volume loss on the right compatible with partial collapse of the right lung. Right bronchial cut off suggesting mucous plugging Electronically Signed   By: Franchot Gallo M.D.   On: 08/01/2020 08:34   DG Chest Port 1 View  Result Date: 08/01/2020 CLINICAL DATA:  Acute respiratory failure with hypoxia EXAM: PORTABLE CHEST 1 VIEW COMPARISON:  Yesterday FINDINGS: Streaky and hazy opacity on the right with volume loss. There was a chest CT at outside hospital 05/09/2020 with no mention of hilar or pulmonary mass. Relatively clear left lung. Cardiac enlargement. No visible effusion or pneumothorax. Tracheostomy tube in place. IMPRESSION: Unchanged right pulmonary opacification with volume loss. Electronically Signed   By: Monte Fantasia M.D.   On: 08/01/2020 07:55   DG Chest Port 1 View  Result Date: 07/31/2020 CLINICAL DATA:  Respiratory failure. EXAM: PORTABLE CHEST 1 VIEW COMPARISON:  07/31/2020 FINDINGS: At 1949 hours. The cardio pericardial silhouette is enlarged. Low lung volumes. Interval decrease in right lung collapse/consolidative  opacity. Tracheostomy tube again noted. Telemetry leads overlie the chest. IMPRESSION: 1. Interval decrease in right lung collapse/consolidative opacity. 2. Low lung volumes. Electronically Signed   By: Misty Stanley M.D.   On: 07/31/2020 20:13   DG Chest Port 1 View  Result Date: 07/31/2020 CLINICAL DATA:  Worsening shortness of breath today. History of hypertension. EXAM: PORTABLE CHEST 1 VIEW COMPARISON:  07/25/2020 FINDINGS: Tracheostomy in place. Left lung remains largely clear. New development of infiltrate, volume loss and consolidation in the mid and lower lung on the right. IMPRESSION: New infiltrate, volume loss and consolidation in the mid and lower lung on the right. Electronically Signed   By: Nelson Chimes M.D.   On: 07/31/2020 17:14   DG UGI W SINGLE CM (SOL OR THIN BA)  Result Date: 08/02/2020 CLINICAL DATA:  Evaluate for gastroesophageal reflux. Aspiration pneumonia. EXAM: UPPER GI SERIES WITHOUT KUB TECHNIQUE: Routine upper GI series was performed with thin and thick barium. FLUOROSCOPY TIME:  Fluoroscopy Time:  0.8 minute Radiation Exposure Index (if provided by the fluoroscopic device): 8.9 mGy Number of Acquired Spot Images: 0 COMPARISON:  None. FINDINGS: Peg tube is present projecting over the stomach. 100 mL of thin barium was hand injected through the PEG tube into the stomach. The contrast opacifies  the stomach. There is emptying of contrast into the proximal small bowel. During the period of observation, there was no gastroesophageal reflux. IMPRESSION: No evidence of gastroesophageal reflux. Electronically Signed   By: Kathreen Devoid   On: 08/02/2020 13:43   [place] EKG SITE RITE  Result Date: 08/21/2020 If Site Rite image not attached, placement could not be confirmed due to current cardiac rhythm.   Micro Results    No results found for this or any previous visit (from the past 240 hour(s)).  Today   Subjective    Ryan Day feels much improved since admission.  Feels  ready to go home.  Denies chest pain, shortness of breath or abdominal pain.  Feels they can take care of themselves with the resources they have at home.  Objective   Blood pressure 136/89, pulse 70, temperature 98.4 F (36.9 C), temperature source Axillary, resp. rate 18, height '6\' 5"'  (1.956 m), weight 113.1 kg, SpO2 99 %.  No intake or output data in the 24 hours ending 08/27/20 1821  Exam General: Patient appears well and in good spirits sitting up in bed in no acute distress.  Eyes: sclera anicteric, conjuctiva mild injection bilaterally CVS: S1-S2, regular  Respiratory:  decreased air entry bilaterally secondary to decreased inspiratory effort, rales at bases  GI: NABS, soft, NT  LE: No edema.  Neuro: c4 quadriplegia Psych: patient is logical and coherent, judgement and insight appear normal, mood and affect appropriate to situation.    Data Review   CBC w Diff:  Lab Results  Component Value Date   WBC 6.6 08/24/2020   HGB 7.8 (L) 08/24/2020   HGB 14.7 02/01/2020   HCT 24.8 (L) 08/24/2020   HCT 44.1 02/01/2020   PLT 234 08/24/2020   PLT 267 02/01/2020   LYMPHOPCT 16 08/24/2020   MONOPCT 9 08/24/2020   EOSPCT 5 08/24/2020   BASOPCT 0 08/24/2020    CMP:  Lab Results  Component Value Date   NA 146 (H) 08/25/2020   NA 143 02/01/2020   K 3.7 08/25/2020   CL 110 08/25/2020   CO2 27 08/25/2020   BUN 18 08/25/2020   BUN 8 02/01/2020   CREATININE 0.63 08/25/2020   PROT 6.7 08/24/2020   PROT 7.7 02/01/2020   ALBUMIN 2.2 (L) 08/24/2020   ALBUMIN 4.4 02/01/2020   BILITOT 0.6 08/24/2020   BILITOT 1.1 02/01/2020   ALKPHOS 96 08/24/2020   AST 22 08/24/2020   AST 23 11/13/2017   ALT 30 08/24/2020   ALT 25 11/13/2017  .   Total Time in preparing paper work, data evaluation and todays exam - 35 minutes  Vashti Hey M.D on 08/27/2020 at 6:21 PM  [facility]   Office  [phone]

## 2020-08-28 ENCOUNTER — Telehealth: Payer: Self-pay

## 2020-08-28 NOTE — Telephone Encounter (Signed)
Routing to provider  

## 2020-08-28 NOTE — Telephone Encounter (Signed)
[name] is here stating her and her daughter will need FMLA paper work signed and filled in order for them to be able to be out of work and help Pt. Wife stated this could be faxed when complemented. [name] also asking assistance in order for some one to come out to home to draw pts blood due to him being paralyzed , pt's wife verbalized understanding that  Apt may be needed but wanted to first ask provider if apt is need or can lab work be ordered due to him being bed ridden and have some one come to home to draw blood.

## 2020-08-31 ENCOUNTER — Ambulatory Visit (INDEPENDENT_AMBULATORY_CARE_PROVIDER_SITE_OTHER): Payer: BC Managed Care – PPO | Admitting: Unknown Physician Specialty

## 2020-08-31 ENCOUNTER — Other Ambulatory Visit: Payer: Self-pay

## 2020-08-31 ENCOUNTER — Encounter: Payer: Self-pay | Admitting: Unknown Physician Specialty

## 2020-08-31 ENCOUNTER — Telehealth: Payer: Self-pay

## 2020-08-31 VITALS — BP 102/58 | HR 68 | Temp 97.0°F

## 2020-08-31 DIAGNOSIS — Z930 Tracheostomy status: Secondary | ICD-10-CM

## 2020-08-31 DIAGNOSIS — G8250 Quadriplegia, unspecified: Secondary | ICD-10-CM | POA: Diagnosis not present

## 2020-08-31 DIAGNOSIS — M542 Cervicalgia: Secondary | ICD-10-CM

## 2020-08-31 MED ORDER — DICLOFENAC SODIUM 1 % EX GEL: 4.0000 g | Freq: Four times a day (QID) | CUTANEOUS | 1 refills | Status: DC

## 2020-08-31 NOTE — Assessment & Plan Note (Signed)
Needs a bed extension.  Order written and will send to specialty care

## 2020-08-31 NOTE — Progress Notes (Signed)
BP (!) 102/58   Pulse 68   Temp (!) 97 F (36.1 C)   SpO2 96%    Subjective:    Patient ID: Ryan Day, male    DOB: [birth year], 69 y.o.   MRN: [id number]  HPI: Ryan Day is a 69 y.o. male  Chief Complaint  Patient presents with  . Neck Pain    For the last couple of days. C-5 fusion on 04/26/20   Due to the catastrophic nature of the COVID-19 pandemic, this visit was completed via audio and visual contact via [employer] due to the restrictions of the COVID-19 pandemic. All issues as above were discussed and addressed. Physical exam was done as above through visual confirmation on [employer]. If it was felt that the patient should be evaluated in the office, they were directed there. The patient verbally consented to this visit."} . Location of the patient: home . Location of the provider: work . Those involved with this call:  . Provider: Kathrine Haddock, DNP . CMA: Tiffany Reel, CMA . Front Desk/Registration: Jill Side  . Time spent on call: 20 minutes with patient face to face via video conference. More than 50% of this time was spent in counseling and coordination of care. 20 minutes total spent in review of patient's record and preparation of their chart.  I verified patient identity using two factors (patient name and date of birth). Patient consents verbally to being seen via telemedicine visit today.   Neck Pain  This is a new (3 days at his surgical spot. Had surgery August 5) problem. The problem occurs intermittently. The problem has been gradually worsening. The pain is associated with nothing (Quadraplegic). The quality of the pain is described as aching. Nothing aggravates the symptoms. Worse during: Felf better with towel behind neck. Associated symptoms include paresis. Pertinent negatives include no chest pain, fever, headaches, leg pain, numbness, pain with swallowing, photophobia, syncope, tingling, trouble swallowing, visual change or weight loss. He has tried  acetaminophen (memory foam pillow) for the symptoms. The treatment provided moderate relief.   Has Baclofen that he takes TID   Relevant past medical, surgical, family and social history reviewed and updated as indicated. Interim medical history since our last visit reviewed. Allergies and medications reviewed and updated.  Review of Systems  Constitutional: Negative for fever and weight loss.  HENT: Negative for trouble swallowing.   Eyes: Negative for photophobia.  Cardiovascular: Negative for chest pain and syncope.  Musculoskeletal: Positive for neck pain.  Neurological: Negative for tingling, numbness and headaches.    Per HPI unless specifically indicated above     Objective:    BP (!) 102/58   Pulse 68   Temp (!) 97 F (36.1 C)   SpO2 96%   Wt Readings from Last 3 Encounters:  08/15/20 249 lb 5.4 oz (113.1 kg)  08/08/20 245 lb 2.4 oz (111.2 kg)  07/23/20 238 lb 1.6 oz (108 kg)    Physical Exam Constitutional:      Appearance: Normal appearance.  HENT:     Head: Atraumatic.     Nose: Nose normal.  Pulmonary:     Effort: Pulmonary effort is normal.     Breath sounds: Normal breath sounds.  Neurological:     Mental Status: He is alert.  Psychiatric:        Mood and Affect: Mood normal.    Assessment & Plan:   Problem List Items Addressed This Visit      Unprioritized  Quadriplegia ([place])    Needs a bed extension.  Order written and will send to specialty care      Tracheostomy in place [facility])    Needs supplies.  Written and will send to specialty care      Relevant Orders   PR TRACHEOSTOMY INNER CANNULA    Other Visit Diagnoses    Neck pain    -  Primary   PT is pending.  Voltaren gel as needed.  already has Baclofen taking TID.  Consider Lidoderm patch.  Continue positioning supportive care       Follow up plan: Return if symptoms worsen or fail to improve.

## 2020-08-31 NOTE — Telephone Encounter (Signed)
Attempted to call and go over labs. Patient VM full. Called cell and spoke to wife who understood that he is still anemic and she will relay this to PCP and follow up with PCP>

## 2020-08-31 NOTE — Telephone Encounter (Signed)
Patient calling regarding lab results. Please advise. 

## 2020-08-31 NOTE — Assessment & Plan Note (Signed)
Needs supplies.  Written and will send to specialty care

## 2020-09-01 ENCOUNTER — Telehealth: Payer: Self-pay | Admitting: Family Medicine

## 2020-09-01 NOTE — Telephone Encounter (Signed)
Needs hospital follow up to get everything needed ordered

## 2020-09-01 NOTE — Telephone Encounter (Signed)
Patient wife, Ryan Day, is calling to requesting a PT referral some where in the [place] area that specializes in spinal injuries. Please advise [phone]

## 2020-09-01 NOTE — Telephone Encounter (Signed)
Called linda scheduled virtual hosp f/u 12/14 with Dr Ky Barban

## 2020-09-04 ENCOUNTER — Telehealth: Payer: Self-pay

## 2020-09-04 NOTE — Telephone Encounter (Signed)
Copied from [place] [phone]. Topic: General - Other >> Sep 04, 2020 11:26 AM Rainey Pines A wrote: Patients wife would like fmla paperwork that was signed off on faxed to the number that was on the document. Patients wife also wanted a status update on the cap paperwork as well and wanted to inform that Suhas Estis will be faxing over additional fmla paperwork for herself in regards to patient.

## 2020-09-04 NOTE — Telephone Encounter (Signed)
Called and LVM for patient's wife. FMLA paperwork was faxed in the the number on the form. CAP paperwork is completed and up front for pick.

## 2020-09-05 ENCOUNTER — Telehealth (INDEPENDENT_AMBULATORY_CARE_PROVIDER_SITE_OTHER): Payer: BC Managed Care – PPO | Admitting: Family Medicine

## 2020-09-05 ENCOUNTER — Telehealth: Payer: Self-pay

## 2020-09-05 ENCOUNTER — Encounter: Payer: Self-pay | Admitting: Family Medicine

## 2020-09-05 VITALS — BP 115/62 | HR 72 | Temp 97.4°F

## 2020-09-05 DIAGNOSIS — Z930 Tracheostomy status: Secondary | ICD-10-CM

## 2020-09-05 DIAGNOSIS — S14109S Unspecified injury at unspecified level of cervical spinal cord, sequela: Secondary | ICD-10-CM | POA: Diagnosis not present

## 2020-09-05 DIAGNOSIS — I10 Essential (primary) hypertension: Secondary | ICD-10-CM

## 2020-09-05 DIAGNOSIS — J690 Pneumonitis due to inhalation of food and vomit: Secondary | ICD-10-CM | POA: Diagnosis not present

## 2020-09-05 DIAGNOSIS — G8250 Quadriplegia, unspecified: Secondary | ICD-10-CM

## 2020-09-05 DIAGNOSIS — L89154 Pressure ulcer of sacral region, stage 4: Secondary | ICD-10-CM

## 2020-09-05 DIAGNOSIS — G903 Multi-system degeneration of the autonomic nervous system: Secondary | ICD-10-CM

## 2020-09-05 DIAGNOSIS — E870 Hyperosmolality and hypernatremia: Secondary | ICD-10-CM

## 2020-09-05 LAB — FUNGAL ORGANISM REFLEX

## 2020-09-05 LAB — FUNGUS CULTURE WITH STAIN

## 2020-09-05 LAB — FUNGUS CULTURE RESULT

## 2020-09-05 NOTE — Patient Instructions (Signed)
It was great to see you! I am glad you are doing better and will be home for Christmas!  Our plans for today:  - I sent a message to our nurse case manager to see about when to expect your tracheostomy supplies and bed extension. Orders were placed on 08/31/20. - I placed a referral for home health physical therapy and specified a preference for someone skilled with nerve injuries.  - We will work to get the recent lab results from your home health agency. - Keep your follow up appointments with Surgery and Infectious Disease.  - No medication changes today.   - Make a follow up appointment for 2 months (this can be virtual), after you finish your antibiotics.   Take care and seek immediate care sooner if you develop any concerns.   Dr. Ky Barban

## 2020-09-05 NOTE — Assessment & Plan Note (Addendum)
Hasn't received supplies yet, message sent to nurse case manager for follow up.

## 2020-09-05 NOTE — Telephone Encounter (Signed)
Forms re-faxed to HR for the patient's daughter.

## 2020-09-05 NOTE — Telephone Encounter (Signed)
Pt daughter [name] is calling her HR only received the last page of FMLA please refax

## 2020-09-05 NOTE — Assessment & Plan Note (Addendum)
Order placed for HHPT. Hasn't received trach supplies or bed extension yet, message sent to nurse case manager for follow up.

## 2020-09-05 NOTE — Progress Notes (Signed)
Virtual Visit via Video Note  I connected with Ryan Day on 09/05/20 at  2:00 PM EST by a video enabled telemedicine application and verified that I am speaking with the correct person using two identifiers.  Location: Patient: home Provider: CFP Sister and wife also on call.   I discussed the limitations of evaluation and management by telemedicine and the availability of in person appointments. The patient expressed understanding and agreed to proceed.  History of Present Illness:  HOSPITAL FOLLOW UP Admitted 11/22-12/5/21 Time since discharge: 9 days Hospital/facility: [facility] Diagnosis: HAP, OM of L sacral decub ulcer, hypernatremia Procedures/tests: sacral wound debridement 11/24 w/ wound vac. Wound culture +enterococci. Consultants: ID New medications: 6wks IV Zosyn Discharge instructions:   - increase free water to 498ml QID. Has been giving 666ml QID. - f/u with PCP, recheck BMP. Status: better   Has wound care coming out on Mondays to measure and change dressing. Wife performing every other day dressing changes.  Checking BP at home. 110-115/60s. Still on florinef and midodrine.   Has ID f/u on 12/23. Has surgery f/u in January. [place].  Recently had labs done, ordered by ID. Needs order for HHPT. Still awaiting trach supplies.  Observations/Objective:  Lying in bed, trach in place. Comfortable WOB on RA.  Assessment and Plan:  Quadriplegia ([place]) Order placed for HHPT. Hasn't received trach supplies or bed extension yet, message sent to nurse case manager for follow up.  Decubitus ulcer of sacral region, stage 4 ([place]) Healing per wife. Continue with dressing changes and keep f/u with ID and surgery. Due to finish IV abx in January.  Hypernatremia Noted during recent hospitalization. Doing well with free water flushes. Will obtain recent labs drawn by [facility], if did not include Na level, recommend drawing BMP.  Tracheostomy in place [facility]) Hasn't  received supplies yet, message sent to nurse case manager for follow up.  Aspiration pneumonia ([place]) Doing well. Continues on IV abx for sacral wound as well.   Neurogenic orthostatic hypotension ([facility]) Doing well on current regimen, no changes made. Will obtain labs recently drawn by [facility].    I discussed the assessment and treatment plan with the patient. The patient was provided an opportunity to ask questions and all were answered. The patient agreed with the plan and demonstrated an understanding of the instructions.   The patient was advised to call back or seek an in-person evaluation if the symptoms worsen or if the condition fails to improve as anticipated.  I provided 15 minutes of non-face-to-face time during this encounter.   Myles Gip, DO

## 2020-09-05 NOTE — Assessment & Plan Note (Addendum)
Noted during recent hospitalization. Doing well with free water flushes. Will obtain recent labs drawn by [facility], if did not include Na level, recommend drawing BMP.

## 2020-09-05 NOTE — Assessment & Plan Note (Signed)
Healing per wife. Continue with dressing changes and keep f/u with ID and surgery. Due to finish IV abx in January.

## 2020-09-05 NOTE — Assessment & Plan Note (Signed)
Doing well. Continues on IV abx for sacral wound as well.

## 2020-09-05 NOTE — Telephone Encounter (Signed)
-----   Message from Myles Gip, DO sent at 09/05/2020  2:36 PM EST ----- Can we get copies of recent labs that were drawn by home health nurse? Patient's ID doctor [facility] infectious disease) ordered them.   Thanks!

## 2020-09-05 NOTE — Assessment & Plan Note (Addendum)
Doing well on current regimen, no changes made. Will obtain labs recently drawn by [facility].

## 2020-09-05 NOTE — Telephone Encounter (Signed)
Spoke with [name] and she stated she would call them and ask for them to send [place] the recent labs or she may come to sign the realease form as she can not remember the name of the home health or DR.

## 2020-09-06 ENCOUNTER — Telehealth: Payer: Self-pay

## 2020-09-06 ENCOUNTER — Other Ambulatory Visit: Payer: Self-pay | Admitting: Family Medicine

## 2020-09-06 DIAGNOSIS — S14109S Unspecified injury at unspecified level of cervical spinal cord, sequela: Secondary | ICD-10-CM

## 2020-09-06 DIAGNOSIS — G8250 Quadriplegia, unspecified: Secondary | ICD-10-CM

## 2020-09-06 NOTE — Telephone Encounter (Signed)
Hey Dr.Rumball, did you place a referral to the CCM team for this yesterday when the patient was seen?

## 2020-09-06 NOTE — Telephone Encounter (Signed)
I did not place a formal referral, but that's a great idea, I'll do that :) I've been talking with Janett Billow about his [facility] referral if family asks.

## 2020-09-06 NOTE — Telephone Encounter (Signed)
Called and spoke with [employer], they got the order today so the supplies will be delivered in 2-3 days.  Patient's wife notified.

## 2020-09-06 NOTE — Telephone Encounter (Signed)
Perfect, thank you

## 2020-09-06 NOTE — Telephone Encounter (Signed)
Copied from [place] [phone]. Topic: General - Other >> Sep 06, 2020  1:22 PM Yvette Rack wrote: Reason for CRM: Pt wife called to see if Rx for trach was sent to [place]. Cb# [phone]

## 2020-09-07 ENCOUNTER — Telehealth: Payer: Self-pay | Admitting: Infectious Diseases

## 2020-09-07 ENCOUNTER — Telehealth: Payer: Self-pay

## 2020-09-07 ENCOUNTER — Telehealth: Payer: Self-pay | Admitting: General Practice

## 2020-09-07 NOTE — Telephone Encounter (Signed)
[name] called asking if her husband should br taking potassium supplements for low potassium.    Please advise

## 2020-09-07 NOTE — Telephone Encounter (Signed)
°  Chronic Care Management   Note  09/07/2020 Name: Ryan Day MRN: [id number] DOB: [birth year]  Call made to [place] at [phone] and inquired about the needed supplies for tracheostomy and bed extension. Spoke to [place].  She could not find any orders for tracheostomy supplies or bed rails.  Call to [facility] and it was told there were not orders for supplies.   Spoke with office staff and they confirmed that it was sent to [place] at [phone].  Supplies should arrive in one to two days.   Attempted to reach the patient/patients wife today without success. Will have care guide scheduler to reach out to see what is the best number to call and also get on [facility] for initial outreach.     Follow up plan: The care management team will reach out to the patient again over the next 7 to 10 days.   Noreene Larsson RN, MSN, [facility] Mobile: [phone]

## 2020-09-07 NOTE — Telephone Encounter (Signed)
Called and spoke with patient's wife, she states that home health was to be sending them to [place], she will reach out to them

## 2020-09-07 NOTE — Telephone Encounter (Signed)
Advised anemic but everything else is WNL and instructed plenty of water be given to patient. Also changed visit to mychart video visit.

## 2020-09-07 NOTE — Telephone Encounter (Signed)
  Chronic Care Management   Outreach Note  09/07/2020 Name: Kahne Helfand MRN: [id number] DOB: Aug 04, 1951  Referred by: Valerie Roys, DO Reason for referral : Establish Care (RNCM urgent referral for Chronic Disease Management and Care Coordination Needs)   Nyree Yonker is enrolled in a Managed Medicaid Health Plan: No  An unsuccessful telephone outreach was attempted today. The patient was referred to the case management team for assistance with care management and care coordination.   Follow Up Plan: The care management team will reach out to the patient again over the next 7 to 14 days.   Noreene Larsson RN, MSN, [facility] Mobile: [phone]

## 2020-09-07 NOTE — Telephone Encounter (Signed)
Pt wife called and asked for results. 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

## 2020-09-07 NOTE — Telephone Encounter (Signed)
According to recent telephone note, all labs that were drawn by [place] were within normal limits except he was slightly anemic. Wife should check with ID who ordered labs see if they drew a potassium level as we aren't able to view these results. Hopefully we will receive a copy of these results soon.

## 2020-09-07 NOTE — Telephone Encounter (Signed)
Routing to PCP to advise

## 2020-09-07 NOTE — Telephone Encounter (Signed)
Copied from [place] [phone]. Topic: General - Other >> Sep 07, 2020 10:01 AM Yvette Rack wrote: Reason for CRM: Pt wife requests call back to go over pt most recent lab results as she is not able to view the results on [facility].

## 2020-09-08 NOTE — Telephone Encounter (Signed)
Called and LVM letting patient's wife know Dr. Mary Sella message. Asked for her to call back with any questions.

## 2020-09-09 ENCOUNTER — Other Ambulatory Visit: Payer: Self-pay | Admitting: Physical Medicine and Rehabilitation

## 2020-09-11 ENCOUNTER — Telehealth: Payer: Self-pay

## 2020-09-11 ENCOUNTER — Telehealth: Payer: Self-pay | Admitting: Family Medicine

## 2020-09-11 MED ORDER — BACLOFEN 5 MG PO TABS: 5.0000 mg | ORAL_TABLET | Freq: Three times a day (TID) | ORAL | 0 refills | Status: DC

## 2020-09-11 NOTE — Telephone Encounter (Signed)
I have never met this patient. Can we see if someone who has seen him can fill them out?

## 2020-09-11 NOTE — Telephone Encounter (Signed)
[name] and daughter would like FMLA paper work sighed in order for them to be able to take care of pt  Due to him being homebound,they would like forms faxed and a phone call when this is done. Forms left in bin to be reviewed.

## 2020-09-11 NOTE — Telephone Encounter (Signed)
Patient's wife notified.   She will get nurse to draw blood to check potassium when she comes back  Patient needs a refill on baclofen

## 2020-09-11 NOTE — Telephone Encounter (Signed)
Copied from [place] [phone]. Topic: General - Other >> Sep 11, 2020 10:26 AM Hinda Lenis D wrote: Pts wife would like a call back to go over therapy / please advise

## 2020-09-11 NOTE — Telephone Encounter (Signed)
Tried to call patient, no answer, will try again. 

## 2020-09-11 NOTE — Telephone Encounter (Signed)
Can these be completed?

## 2020-09-11 NOTE — Telephone Encounter (Signed)
OK to take 2 baclofen as needed. Per Dr. Mary Sella last note was supposed to have BMP drawn prior to potassium supplements. Was this done?

## 2020-09-11 NOTE — Telephone Encounter (Signed)
Called and spoke with patients wife, they would like to go ahead with in home PT. The referral coordinator was notified and she will work on it.

## 2020-09-11 NOTE — Telephone Encounter (Signed)
Can you fill this out?

## 2020-09-11 NOTE — Telephone Encounter (Signed)
Copied from [place] [phone]. Topic: General - Other >> Sep 11, 2020  3:44 PM Rainey Pines A wrote: Patients wife requesting callback in regards to patients baclofen medication being called in for 10mg  today. Please advise

## 2020-09-11 NOTE — Telephone Encounter (Signed)
Patient's wife is calling to request a refill for Baclofen 5 MG TABS [[id number]] But is also requesting the MG to be increased. Reports that the patinet is having increase muscel spasms. Please advise   Ryan Day the patient daughter that is an Therapist, sports is calling to report that the patient's potassium is low. Does a script need to be sent for potassium? Patient was on potassium in the hospital but was not sent home with a script for potassium.  [name].

## 2020-09-11 NOTE — Telephone Encounter (Signed)
Routing to provider  

## 2020-09-12 ENCOUNTER — Telehealth: Payer: Self-pay | Admitting: General Practice

## 2020-09-12 NOTE — Telephone Encounter (Signed)
Forms completed

## 2020-09-12 NOTE — Telephone Encounter (Signed)
  Chronic Care Management   Outreach Note  09/12/2020 Name: Ryan Day MRN: [id number] DOB: [birth year]  Referred by: Valerie Roys, DO Reason for referral : Establish Care (RNCM: 2nd attempt to reach patient/caregiver for Initial outreach for Chronic Disease Management and Care Coordination Needs )   [name] is enrolled in a Managed Medicaid Health Plan: No  A second unsuccessful telephone outreach was attempted today. The patient was referred to the case management team for assistance with care management and care coordination.   Follow Up Plan: A HIPAA compliant phone message was left for the patient providing contact information and requesting a return call.   Noreene Larsson RN, MSN, [facility] Mobile: [phone]

## 2020-09-12 NOTE — Telephone Encounter (Signed)
Patient's daughter notified.

## 2020-09-13 ENCOUNTER — Other Ambulatory Visit: Payer: BC Managed Care – PPO | Admitting: Nurse Practitioner

## 2020-09-13 ENCOUNTER — Other Ambulatory Visit: Payer: Self-pay

## 2020-09-13 ENCOUNTER — Encounter: Payer: Self-pay | Admitting: Nurse Practitioner

## 2020-09-13 DIAGNOSIS — Z515 Encounter for palliative care: Secondary | ICD-10-CM

## 2020-09-13 DIAGNOSIS — S14109S Unspecified injury at unspecified level of cervical spinal cord, sequela: Secondary | ICD-10-CM

## 2020-09-13 NOTE — Progress Notes (Signed)
[facility] Palliative Care Consult Note Telephone: [phone]  Fax: [phone]  PATIENT NAME: Ryan Day DOB: Jan 26, 1951 MRN: [id number]  PRIMARY CARE PROVIDER:   Valerie Roys, DO  REFERRING PROVIDER:  Valerie Roys, DO [place],  [place] [address] RESPONSIBLE PARTY:   Self; wife Axell Trigueros [phone]; [phone]  1.Advance Care Planning; Full code with aggressive interventions; MOST form in vynca  2. Goals of Care: Goals include to maximize quality of life and symptom management. Our advance care planning conversation included a discussion about:   The value and importance of advance care planning  Exploration of personal, cultural or spiritual beliefs that might influence medical decisions  Exploration of goals of care in the event of a sudden injury or illness  Identification and preparation of a healthcare agent  Review and updating or creation of anadvance directive document.  3.Palliative care encounter; Palliative care encounter; Palliative medicine team will continue to support patient, patient's family, and medical team. Visit consisted of counseling and education dealing with the complex and emotionally intense issues of symptom management and palliative care in the setting of serious and potentially life-threatening illness  4. F/u 4 weeks for ongoing monitoring chronic disease management, symptoms, nutrition, complex medical decision making   I spent 65 minutes providing this consultation,  from 8:45am to 9:45am. More than 50% of the time in this consultation was spent coordinating communication.   HISTORY OF PRESENT ILLNESS:  Ryan Day is a 69 y.o. year old male with multiple medical problems including  quadriplegia from a fall 04/2020, HTN, HLD, trach, g-tube essential to sustain life. Hospitalized 08/14/2020 to 08/27/2020 for shortness of breath, sacral pressure ulcer. Ryan Day underwent debridement on  08/16/2020 with wound vac which he continues to have. Workup significant for osteomyelitis of left sacral decubitus ulcer, aspiration PNA, hypernatremia, increase airway secretions with hx mucous plugging, hypokalemia, peg-dependent with tube feeding, hypotension on midodrine, florinef with stable bp, C5 vertebral body fx, C3-C6 cord compression with edema s/p C3-C6 laminectomy with fusion, repair of dural tear in 04/2020 at [facility]. Depression continued on prozac, continue on famotidine for gerd, iron supplements for IDA. In-person Follow-up PC visit for ongoing discussion of complex medical decision making, disease progression, symptoms. I visited Mr. and Mrs. Hinton, daughter Ryan Day. We talked about recent hospitalization. We talked about arrangements that were made for home health who are coming, working with TF, wound vac. Ryan Day endorses hopefully therapy will begin soon. Ryan Day endorses she is waiting for the bed to be changed out, not long enough and concern about rails. We talked about how Ryan Day has been doing. Ryan Day endorses they will hoyer him to his electric w/c today. Plans are to get Ryan Day up for Christmas. We talked about symptoms, wound, feedings. We talked about medical goals of care. We talked about importance of home health. Ms. Bazen endorses, Ryan Day just received Medicaid. We talked about caps and pcs program. Ms. Lorusso endorses she just completed both of those applications. We talked about advocating for Ryan Day, praised Ryan Day for being persistent. I visited and observed Ryan Day. Ryan Day appears debilitated, able to speak, secretions improved. Ryan Day endorses he is comfortable, no pain, no dyspnea. Tolerating feedings. Ryan Day endorses he has no complaints or concerns. Support provided. I spoke with Ryan Day separately about care givers, care giver fatigue, stresses, importance of self care with coping strategies. We talked about role of PC  in Ryan Day, no new changes at this time.  Will f/u with PC visit in 4 weeks if needed or sooner if declines. Ryan Day in agreement, appointment, appointment scheduled, Questions answered to satisfaction, contact information.   Palliative Care was asked to help to continue to address goals of care.   CODE STATUS: Full code  PPS: 30% HOSPICE ELIGIBILITY/DIAGNOSIS: TBD  PAST MEDICAL HISTORY:  Past Medical History:  Diagnosis Date  . Acute on chronic respiratory failure with hypoxia ([place])   . Acute renal injury due to hypovolemia ([place])   . Autonomic instability   . Cardiac arrest ([place])   . Cervical spinal cord injury, sequela ([place])   . Elevated alkaline phosphatase level   . 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 Hyperlipidemia   . Hypertension     SOCIAL HX:  Social History   Tobacco Use  . Smoking status: Never Smoker  . Smokeless tobacco: Never Used  Substance Use Topics  . Alcohol use: Yes    Comment: 1 or less    ALLERGIES: No Known Allergies   PERTINENT MEDICATIONS:  Outpatient Encounter Medications as of 09/13/2020  Medication Sig  . acetaminophen (TYLENOL) 160 MG/5ML solution Place 10-20 mLs (320-640 mg total) into feeding tube every 4 (four) hours as needed for mild pain.  Marland Kitchen ascorbic acid (VITAMIN C) 500 MG tablet Place 1 tablet (500 mg total) into feeding tube 2 (two) times daily.  . Baclofen 5 MG TABS Place 5-10 mg into feeding tube 3 (three) times daily.  . bisacodyl (DULCOLAX) 10 MG suppository Place 1 suppository (10 mg total) rectally at bedtime.  . chlorhexidine (PERIDEX) 0.12 % solution 15 mLs by Mouth Rinse route 2 (two) times daily.  . collagenase (SANTYL) ointment Apply topically daily.  . diclofenac Sodium (VOLTAREN) 1 % GEL Apply 4 g topically 4 (four) times daily.  . famotidine (PEPCID) 20 MG tablet Place 1 tablet (20 mg total) into feeding tube daily.  . ferrous sulfate 300 (60 Fe) MG/5ML syrup Place 1.3 mLs (78 mg total) into feeding tube daily.  . fludrocortisone (FLORINEF) 0.1  MG tablet Place 2 tablets (0.2 mg total) into feeding tube daily.  Marland Kitchen FLUoxetine (PROZAC) 20 MG/5ML solution Place 10 mLs (40 mg total) into feeding tube daily.  Marland Kitchen guaiFENesin 200 MG tablet Place 2 tablets (400 mg total) into feeding tube every 6 (six) hours.  . lidocaine (LIDODERM) 5 % Place 1 patch onto the skin daily. Remove & Discard patch within 12 hours or as directed by MD  . midodrine (PROAMATINE) 10 MG tablet Place 1 tablet (10 mg total) into feeding tube 3 (three) times daily with meals.  . Mouthwashes (MOUTH RINSE) LIQD solution 15 mLs by Mouth Rinse route 2 times daily at 12 noon and 4 pm.  . Multiple Vitamin (MULTIVITAMIN) LIQD Place 5 mLs into feeding tube daily.  . nutrition supplement, JUVEN, (JUVEN) PACK Place 1 packet into feeding tube 2 (two) times daily between meals.  . Nutritional Supplements (FEEDING SUPPLEMENT, OSMOLITE 1.5 CAL,) LIQD Place 474 mLs into feeding tube 4 (four) times daily.  . Nutritional Supplements (FEEDING SUPPLEMENT, PROSOURCE TF,) liquid Place 45 mLs into feeding tube 2 (two) times daily.  . ondansetron (ZOFRAN) 4 MG tablet Place 1 tablet (4 mg total) into feeding tube every 8 (eight) hours as needed for nausea, vomiting or refractory nausea / vomiting.  . piperacillin-tazobactam (ZOSYN) IVPB Inject 13.5 g into the vein continuous. Infuse piperacillin/tazobactam 13.5gm over 24h as continuous  infusion  Indication:  Sacral decubitus and osteomyelitis First Dose: Yes Last Day of Therapy:  09/26/2020 Labs - Once weekly:  CBC/D and CMP, Labs - Every other week:  ESR  Method of administration: Elastomeric (Continuous infusion) Method of administration may be changed at the discretion of home infusion pharmacist based upon assessment of the patient and/or caregiver's ability to self-administer the medication ordered.  . polycarbophil (FIBERCON) 625 MG tablet Place 1 tablet (625 mg total) into feeding tube daily.  . rivaroxaban (XARELTO) 10 MG TABS tablet Take one  tablet per tube daily  . saccharomyces boulardii (FLORASTOR) 250 MG capsule Place 1 capsule (250 mg total) into feeding tube 2 (two) times daily.  Marland Kitchen scopolamine (TRANSDERM-SCOP) 1 MG/3DAYS Place 1 patch (1.5 mg total) onto the skin every 3 (three) days.  . sennosides (SENOKOT) 8.8 MG/5ML syrup Place 5 mLs into feeding tube daily at 6 (six) AM.  . simethicone (MYLICON) 40 BO/2.8CL drops Place 1.2 mLs (80 mg total) into feeding tube 4 (four) times daily.  . traZODone (DESYREL) 50 MG tablet Place 1 tablet (50 mg total) into feeding tube at bedtime.  . Water For Irrigation, Sterile (FREE WATER) SOLN Place 400 mLs into feeding tube every 4 (four) hours. (Patient taking differently: Place 300 mLs into feeding tube every 4 (four) hours.)  . zinc sulfate 220 (50 Zn) MG capsule Place 1 capsule (220 mg total) into feeding tube daily.   No facility-administered encounter medications on file as of 09/13/2020.    PHYSICAL EXAM:   General: C4 quadriplegic, pleasant male with trach Cardiovascular: regular rate and rhythm Pulmonary: clear ant fields; decrease bases Abdomen: soft, nontender, + bowel sounds; g-tube Neurological: C4 quadriplegic Jashley Yellin Ihor Gully, NP

## 2020-09-14 ENCOUNTER — Ambulatory Visit: Payer: BC Managed Care – PPO | Attending: Infectious Diseases | Admitting: Infectious Diseases

## 2020-09-14 DIAGNOSIS — L89154 Pressure ulcer of sacral region, stage 4: Secondary | ICD-10-CM | POA: Insufficient documentation

## 2020-09-14 NOTE — Progress Notes (Signed)
The purpose of this virtual visit is to provide medical care while limiting exposure to the novel coronavirus (COVID19) for both patient and office staff. Consent was obtained forvideo visit:  Yes.   Answered questions that patient had about telehealth interaction:  Yes.   I discussed the limitations, risks, security and privacy concerns of performing an evaluation and management service by telephone. I also discussed with the patient that there may be a patient responsible charge related to this service. The patient expressed understanding and agreed to proceed.   Patient Location: Home Provider Location:office  Has 2 daughters [name] and Philis Nettle and his wife the in this visit along with the patient and Dr. Delaine Lame Patient is 69 year old male with a history of hypertension, recent fall with C5 vertebral body fracture and C3-C6 cord compression and edema in August 2021.Marland Kitchen  Had surgery at [facility].  Has tracheostomy and PEG.  He was in rehab for a long period of time.  He he then went home. He was recently hospitalized at [facility] between 08/14/2020 until 08/27/2020. Patient had initially presented with shortness of breath.  But also was noted to have a sacral pressure ulcer and he underwent debridement on 08/16/2020.  A wound VAC was placed after that.  Tissue culture came back as rare Enterobacter cloacae, Streptococcus constellatus, Bacteroides.  Sacral bone biopsy was acute osteomyelitis. He was discharged home on piperacillin/tazobactam for a total of 6 weeks to complete on 09/26/2020. Today's his follow-up visit. Patient is stable.  The wound care is being provided by his daughters who are healthcare professionals. I will up to the sacral wound.  It is smaller than before.  And it is also filling up.  He also has bilateral heel eschars.  And as per the daughter the left heel has some discharge. Patient has an appointment at the wound clinic on Monday 1222. The plan is to continue IV antibiotics until  10/04/2020 and then and then decide whether it can be stopped or switched to p.o. antibiotic. Recent labs were reviewed with the patient and his family.  His hemoglobin is improved from 7.8-9.1.  WBC is normal.  Creatinine is normal as well. Discussed the management with the patient, his daughters and his wife. We will follow up after seeing wound care consultant. Total time spent 15 minutes.

## 2020-09-18 ENCOUNTER — Telehealth: Payer: Self-pay

## 2020-09-18 NOTE — Telephone Encounter (Signed)
Copied from CRM [id number]. Topic: General - Other >> Sep 18, 2020 11:06 AM Dalphine Handing A wrote: Patients wife requesting callback from nurse that was assisting her with patients at home therapy and she couldn't remember the nurses name Please advise

## 2020-09-18 NOTE — Telephone Encounter (Signed)
Notified patients wife that I have reached out to the referral coordinator to look into when PT can get started.  Brayton Caves will contact the family.

## 2020-09-20 ENCOUNTER — Telehealth: Payer: Self-pay

## 2020-09-20 NOTE — Chronic Care Management (AMB) (Signed)
Alto Denver RN CM has had two unsuccessful outreaches to patient.  I reached out again today 09/20/2020 to Patient and spoke with Spouse Bonita Quin, Spouse states that they have received 1/2 of their supplies, but the rest of them will be there tomorrow. Spouse states that they need to change the wound vac. It was to be changed yesterday but will not receive supplies until tomorrow. I reached out to Alto Denver RN CM and she advised that Patients spouse will need to contact the home health nurse for that and she will be reaching out to them At her earliest time.

## 2020-09-20 NOTE — Chronic Care Management (AMB) (Signed)
  Chronic Care Management   Note  09/20/2020 Name: Tyrel Lex MRN: [id number] DOB: 1951/05/21  Sriyan Cutting is a 69 y.o. year old male who is a primary care patient of Valerie Roys, DO. I reached out to [employer] by phone today in response to a referral sent by Mr. Rickardo Miltner's PCP, Dr. Wynetta Emery     Mr. Rybacki was given information about Chronic Care Management services today including:  1. CCM service includes personalized support from designated clinical staff supervised by his physician, including individualized plan of care and coordination with other care providers 2. 24/7 contact phone numbers for assistance for urgent and routine care needs. 3. Service will only be billed when office clinical staff spend 20 minutes or more in a month to coordinate care. 4. Only one practitioner may furnish and bill the service in a calendar month. 5. The patient may stop CCM services at any time (effective at the end of the month) by phone call to the office staff. 6. The patient will be responsible for cost sharing (co-pay) of up to 20% of the service fee (after annual deductible is met).  Patient's wife agreed to services and verbal consent obtained.   Follow up plan: Noreene Larsson RN CM will be reaching out to Patient   Noreene Larsson, [address], [place], [place] [address] Direct Dial: [phone] [email or web address] Website: [email or web address]

## 2020-09-21 ENCOUNTER — Telehealth: Payer: Self-pay

## 2020-09-21 NOTE — Telephone Encounter (Signed)
OK to write up and I will sign or call in as needed.

## 2020-09-21 NOTE — Telephone Encounter (Signed)
PT wife request a call back to go over / please advise

## 2020-09-21 NOTE — Telephone Encounter (Signed)
PA for Baclofen initiated and submitted via Cover My Meds. Key: [id number]

## 2020-09-21 NOTE — Telephone Encounter (Signed)
Please call patient's wife to discuss need to change wound.Marland KitchenMarland Kitchen

## 2020-09-21 NOTE — Telephone Encounter (Signed)
Patient's wife is requesting a prescription for the inner cannula for the trach in size 6 [employer]

## 2020-09-25 NOTE — Telephone Encounter (Signed)
Placed in folder for signature

## 2020-09-26 NOTE — Telephone Encounter (Signed)
Patient wife Bonita Quin called in to speak to Shanda Bumps in reference to the home therapy that she was setting up for the patient. Would like a call back please at Ph# [phone]

## 2020-09-28 ENCOUNTER — Ambulatory Visit: Payer: Self-pay | Admitting: General Practice

## 2020-09-28 DIAGNOSIS — G8250 Quadriplegia, unspecified: Secondary | ICD-10-CM

## 2020-09-28 DIAGNOSIS — L89154 Pressure ulcer of sacral region, stage 4: Secondary | ICD-10-CM

## 2020-09-28 DIAGNOSIS — S14109S Unspecified injury at unspecified level of cervical spinal cord, sequela: Secondary | ICD-10-CM

## 2020-09-28 DIAGNOSIS — L89152 Pressure ulcer of sacral region, stage 2: Secondary | ICD-10-CM

## 2020-09-28 DIAGNOSIS — I10 Essential (primary) hypertension: Secondary | ICD-10-CM

## 2020-09-28 DIAGNOSIS — Z930 Tracheostomy status: Secondary | ICD-10-CM

## 2020-09-28 NOTE — Patient Instructions (Signed)
Visit Information  Patient Care Plan: RNCM: Hypertension (Adult)    Problem Identified: RNCM: Hypertension (Hypertension)   Priority: Medium    Goal: RNCM: Hypertension Monitored   Priority: Medium  Note:   Objective:  . Last practice recorded BP readings:  BP Readings from Last 3 Encounters:  09/28/20 (!) 153/82  09/05/20 115/62  08/31/20 (!) 102/58 .   Ryan Day Most recent eGFR/CrCl: No results found for: EGFR  No components found for: CRCL Current Barriers:  Ryan Day Knowledge Deficits related to basic understanding of hypertension pathophysiology and self care management . Knowledge Deficits related to understanding of medications prescribed for management of hypertension . Difficulty obtaining medications . Transportation barriers . Limited Social Support . Unable to independently manage HTN . Unable to self administer medications as prescribed . Lacks social connections . Does not contact provider office for questions/concerns Case Manager Clinical Goal(s):  Ryan Day Over the next 120 days, patient will verbalize understanding of plan for hypertension management . Over the next 120 days, patient will attend all scheduled medical appointments: ENT 10-04-2020 and wound clinic on 10-12-2020 . Over the next 120 days, patient will demonstrate improved adherence to prescribed treatment plan for hypertension as evidenced by taking all medications as prescribed, monitoring and recording blood pressure as directed, adhering to low sodium/DASH diet . Over the next 120 days, patient will demonstrate improved health management independence as evidenced by checking blood pressure as directed and notifying PCP if SBP>160 or DBP > 90, taking all medications as prescribe, and adhering to a low sodium diet as discussed. . Over the next 120 days, patient will verbalize basic understanding of hypertension disease process and self health management plan as evidenced by compliance with plan of  care, and working with CCM  team to optimize health and well being.  Interventions:  . Evaluation of current treatment plan related to hypertension self management and patient's adherence to plan as established by provider. . Provided education to patient re: stroke prevention, s/s of heart attack and stroke, DASH diet, complications of uncontrolled blood pressure . Reviewed medications with patient and discussed importance of compliance . Discussed plans with patient for ongoing care management follow up and provided patient with direct contact information for care management team . Advised patient, providing education and rationale, to monitor blood pressure daily and record, calling PCP for findings outside established parameters.  Patient Goals/Self-Care Activities . Over the next 120 days, patient will:  - Self administers medications as prescribed Attends all scheduled provider appointments Calls provider office for new concerns, questions, or BP outside discussed parameters Checks BP and records as discussed Follows a low sodium diet/DASH diet - blood pressure trends reviewed - depression screen reviewed - home or ambulatory blood pressure monitoring encouraged Follow Up Plan: Telephone follow up appointment with care management team member scheduled for: 11-08-2020 at 11:45 am   Task: RNCM: Identify and Monitor Blood Pressure Elevation   Note:   Care Management Activities:    - blood pressure trends reviewed - depression screen reviewed - home or ambulatory blood pressure monitoring encouraged       Patient Care Plan: RNCM: Management of limited mobility: Quadriplegia    Problem Identified: RNCM: Management of Quadriplegia Coping Skills (General Plan of Care)   Priority: High    Goal: RNCM: Management of Quadriplegia   Priority: High  Note:   Current Barriers:  Ryan Day Knowledge Deficits related to caring for patient with quadriplegia  . Care Coordination needs related to resources in the  community and  support for family in a patient with limited mobility/quadriplegia . Chronic Disease Management support and education needs related to patient with quadriplegia . Lacks caregiver support.  . Transportation barriers . Difficulty obtaining medications . Unable to independently manage mobility . Unable to self administer medications as prescribed . Lacks social connections . Does not contact provider office for questions/concerns  Nurse Case Manager Clinical Goal(s):  Ryan Day Over the next 120 days, patient will verbalize understanding of plan for optimal care for patient with quadriplegia  . Over the next 120 days, patient will work with [facility] [place] team to address needs related to meeting the needs of the patient with limited mobility and quadriplegia   Interventions:  . 1:1 collaboration with Valerie Roys, DO regarding development and update of comprehensive plan of care as evidenced by provider attestation and co-signature . Inter-disciplinary care team collaboration (see longitudinal plan of care) . Evaluation of current treatment plan related to limited mobility and quadriplegia  and patient's adherence to plan as established by provider. . Advised patient to call the office for changes in condition and needs.  The patient's wife states that the patient is 6'5" and the hospital bed he has is too short. The wife did ask about another bed. Will collaborate with the pcp about new order for a bed that is longer.  . Provided education to patient re: caregiver stress and use of resources to help with caregiver burnout in the care of patient with quadriplegia.  Education also on getting the CCM team LCSW and pharmacist to help with expressed needs and support. Education on talking to the [facility] nurse about swallowing evaluation and working with the OT/PT in the home setting.  Ask the wife to write down questions to ask the home health nurse for needs that will help optimize care in the home.  . Reviewed  medications with patient and discussed compliance . Discussed plans with patient for ongoing care management follow up and provided patient with direct contact information for care management team . Provided patient with stress reduction educational materials related to caregiver stress and burnout with the care of a quadriplegia patient   Patient Goals/Self-Care Activities Over the next 120days, patient will:  - Patient will attend all scheduled provider appointments Patient will call provider office for new concerns or questions Patient will work with BSW to address care coordination needs and will continue to work with the clinical team to address health care and disease management related needs.   - blood pressure trends reviewed - depression screen reviewed - home or ambulatory blood pressure monitoring encouraged Follow Up Plan: Telephone follow up appointment with care management team member scheduled for: 11-08-2020 at 11:45 am       Task: RNCM: Support Psychosocial Response to Risk or Actual Health Condition   Note:   Care Management Activities:    - active listening utilized - caregiver stress acknowledged - counseling provided - current coping strategies identified - decision-making supported - healthy lifestyle promoted - mindfulness encouraged - participation in counseling encouraged - problem-solving facilitated - relaxation techniques promoted - self-reflection promoted - verbalization of feelings encouraged       Patient Care Plan: RNCM: Management of Skin Integrity    Problem Identified: RNCM: Management Skin Integrity as evidence by pressure ulcer with wound vac, trach, peg tube, and eschar bilateral heels   Priority: High    Goal: RNCM: Skin Integrity Maintained   Priority: High  Note:   Current Barriers:  .  Knowledge Deficits related to resources available in the community to help with meeting the needs of the patient with skin breakdown and  quadriplegia  . Care Coordination needs related to community resources  in a patient with quadriplegia and impaired skin integrity  . Chronic Disease Management support and education needs related to impaired skin integrity and pressure ulcerations  . Lacks caregiver support.  . Film/video editor.  . Transportation barriers . Difficulty obtaining medications . Unable to independently manage care as being dependent due to fall with injury in August of 2021 resulting in [place]  . Unable to perform ADLs independently . Unable to perform IADLs independently . Does not contact provider office for questions/concerns  Nurse Case Manager Clinical Goal(s):  Ryan Day Over the next 120 days, patient will verbalize understanding of plan for maintaining skin integrity with no further skin break down due to mobility  . Over the next 120 days, patient will work with CCM team, pcp, and specialist  to address needs related to quadriplegic state and current state of skin break down with the potential for new break down  . Over the next 120 days, patient will attend all scheduled medical appointments: ENT next Wednesday, other specialist coming up . Over the next 120 days, patient will demonstrate improved health management independence as evidenced byworking the PT/OT to possibly gain some mobility in hands and arms . Over the next 120 days, patient will work with CM team pharmacist to assist with medication cost and finding alternative pharmacy to meet medication needs . Over the next 120 days, patient will work with CM clinical social worker to assist with coping mechanisms due to traumatic event of fall in August 2021 with resulting quadriplegia   Interventions:  . 1:1 collaboration with Valerie Roys, DO regarding development and update of comprehensive plan of care as evidenced by provider attestation and co-signature . Inter-disciplinary care team collaboration (see longitudinal plan of  care) . Evaluation of current treatment plan related to pressure ulcers, trach care, peg tube care, and bilateral feet ulcerations and patient's adherence to plan as established by provider. . Advised patient to call the office for changes in condition, worsening pressure areas, the need to help facilitate supplies, new concerns or questions.  . Provided education to patient re: adequate nutrition and working with home health to optimize care in the home setting.  . Reviewed medications with patient and discussed compliance. The patients wife ask about assistance with baclofen cost and also for a resource to get fluoxetine . Collaborated with CCM team and pcp regarding wife's expressed needs for the patient . Discussed plans with patient for ongoing care management follow up and provided patient with direct contact information for care management team . Provided patient with nutritional  educational materials related to helping maintain skin integrity  . Reviewed scheduled/upcoming provider appointments including:  . Social Work referral for help with coping after traumatic injury . Pharmacy referral for help with medications cost and medications needs   Patient Goals/Self-Care Activities Over the next 120 days, patient will:  - Patient will attend all scheduled provider appointments Patient will call pharmacy for medication refills Patient will call provider office for new concerns or questions Patient will work with BSW to address care coordination needs and will continue to work with the clinical team to address health care and disease management related needs.   - adherence to skin care regimen encouraged - gentle skin cleansing encouraged - skin assessed  Follow Up Plan: Telephone  follow up appointment with care management team member scheduled for: 11-08-2020 at 11:45 am       Task: RNCM: Support Skin Integrity   Note:   Care Management Activities:    - adherence to skin care  regimen encouraged - gentle skin cleansing encouraged - skin assessed         Mr. Filippini was given information about Chronic Care Management services today including:  1. CCM service includes personalized support from designated clinical staff supervised by his physician, including individualized plan of care and coordination with other care providers 2. 24/7 contact phone numbers for assistance for urgent and routine care needs. 3. Service will only be billed when office clinical staff spend 20 minutes or more in a month to coordinate care. 4. Only one practitioner may furnish and bill the service in a calendar month. 5. The patient may stop CCM services at any time (effective at the end of the month) by phone call to the office staff. 6. The patient will be responsible for cost sharing (co-pay) of up to 20% of the service fee (after annual deductible is met).  Patient agreed to services and verbal consent obtained.   The patient verbalized understanding of instructions, educational materials, and care plan provided today and declined offer to receive copy of patient instructions, educational materials, and care plan.   Telephone follow up appointment with care management team member scheduled for: 11-08-2020 at 11:45 am  Noreene Larsson RN, MSN, [facility] Mobile: [phone]

## 2020-09-28 NOTE — Chronic Care Management (AMB) (Signed)
Chronic Care Management   Initial Visit Note  09/28/2020 Name: Ryan Day MRN: [id number] DOB: [birth year]  Referred by: Valerie Roys, DO Reason for referral : Chronic Care Management (RNCM: Initial Outreach for Chronic Disease Management and Care Coordination Needs )   Ryan Day is a 70 y.o. year old male who is a primary care patient of Valerie Roys, DO. 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    SDOH (Social Determinants of Health) assessments performed: Yes See Care Plan activities for detailed interventions related to SDOH     Medications: Outpatient Encounter Medications as of 09/28/2020  Medication Sig   acetaminophen (TYLENOL) 160 MG/5ML solution Place 10-20 mLs (320-640 mg total) into feeding tube every 4 (four) hours as needed for mild pain.   ascorbic acid (VITAMIN C) 500 MG tablet Place 1 tablet (500 mg total) into feeding tube 2 (two) times daily.   Baclofen 5 MG TABS Place 5-10 mg into feeding tube 3 (three) times daily.   bisacodyl (DULCOLAX) 10 MG suppository Place 1 suppository (10 mg total) rectally at bedtime.   chlorhexidine (PERIDEX) 0.12 % solution 15 mLs by Mouth Rinse route 2 (two) times daily.   collagenase (SANTYL) ointment Apply topically daily.   diclofenac Sodium (VOLTAREN) 1 % GEL Apply 4 g topically 4 (four) times daily.   famotidine (PEPCID) 20 MG tablet Place 1 tablet (20 mg total) into feeding tube daily.   ferrous sulfate 300 (60 Fe) MG/5ML syrup Place 1.3 mLs (78 mg total) into feeding tube daily.   FLUoxetine (PROZAC) 20  MG/5ML solution Place 10 mLs (40 mg total) into feeding tube daily.   guaiFENesin 200 MG tablet Place 2 tablets (400 mg total) into feeding tube every 6 (six) hours.   lidocaine (LIDODERM) 5 % Place 1 patch onto the skin daily. Remove & Discard patch within 12 hours or as directed by MD   midodrine (PROAMATINE) 10 MG tablet Place 1 tablet (10 mg total) into feeding tube 3 (three) times daily with meals.   Mouthwashes (MOUTH RINSE) LIQD solution 15 mLs by Mouth Rinse route 2 times daily at 12 noon and 4 pm.   Multiple Vitamin (MULTIVITAMIN) LIQD Place 5 mLs into feeding tube daily.   nutrition supplement, JUVEN, (JUVEN) PACK Place 1 packet into feeding tube 2 (two) times daily between meals.   Nutritional Supplements (FEEDING SUPPLEMENT, OSMOLITE 1.5 CAL,) LIQD Place 474 mLs into feeding tube 4 (four) times daily.   Nutritional Supplements (FEEDING SUPPLEMENT, PROSOURCE TF,) liquid Place 45 mLs into feeding tube 2 (two) times daily.   ondansetron (ZOFRAN) 4 MG tablet Place 1 tablet (4 mg total) into feeding tube every 8 (eight) hours as needed for nausea, vomiting or refractory nausea / vomiting.   piperacillin-tazobactam (ZOSYN) IVPB Inject 13.5 g into the vein continuous. Infuse piperacillin/tazobactam 13.5gm over 24h as continuous infusion  Indication:  Sacral decubitus and osteomyelitis First Dose: Yes Last Day of Therapy:  09/26/2020 Labs - Once weekly:  CBC/D and CMP, Labs - Every other week:  ESR  Method of administration: Elastomeric (Continuous infusion) Method of administration may be changed at the discretion of home infusion pharmacist  based upon assessment of the patient and/or caregiver's ability to self-administer the medication ordered.   polycarbophil (FIBERCON) 625 MG tablet Place 1 tablet (625 mg total) into feeding tube daily.   rivaroxaban (XARELTO) 10 MG TABS tablet Take one tablet per tube daily   saccharomyces boulardii (FLORASTOR) 250 MG capsule Place 1 capsule  (250 mg total) into feeding tube 2 (two) times daily.   sennosides (SENOKOT) 8.8 MG/5ML syrup Place 5 mLs into feeding tube daily at 6 (six) AM.   simethicone (MYLICON) 40 TL/5.7WI drops Place 1.2 mLs (80 mg total) into feeding tube 4 (four) times daily.   traZODone (DESYREL) 50 MG tablet Place 1 tablet (50 mg total) into feeding tube at bedtime.   Water For Irrigation, Sterile (FREE WATER) SOLN Place 400 mLs into feeding tube every 4 (four) hours. (Patient taking differently: Place 300 mLs into feeding tube every 4 (four) hours.)   zinc sulfate 220 (50 Zn) MG capsule Place 1 capsule (220 mg total) into feeding tube daily.   No facility-administered encounter medications on file as of 09/28/2020.     Objective:   Patient Care Plan: RNCM: Hypertension (Adult)    Problem Identified: RNCM: Hypertension (Hypertension)   Priority: Medium    Goal: RNCM: Hypertension Monitored   Priority: Medium  Note:   Objective:   Last practice recorded BP readings:  BP Readings from Last 3 Encounters:  09/28/20 (!) 153/82  09/05/20 115/62  08/31/20 (!) 102/58     Most recent eGFR/CrCl: No results found for: EGFR  No components found for: CRCL Current Barriers:   Knowledge Deficits related to basic understanding of hypertension pathophysiology and self care management  Knowledge Deficits related to understanding of medications prescribed for management of hypertension  Difficulty obtaining medications  Transportation barriers  Limited Social Support  Unable to independently manage HTN  Unable to self administer medications as prescribed  Lacks social connections  Does not contact provider office for questions/concerns Case Manager Clinical Goal(s):   Over the next 120 days, patient will verbalize understanding of plan for hypertension management  Over the next 120 days, patient will attend all scheduled medical appointments: ENT 10-04-2020 and wound clinic on 10-12-2020  Over the  next 120 days, patient will demonstrate improved adherence to prescribed treatment plan for hypertension as evidenced by taking all medications as prescribed, monitoring and recording blood pressure as directed, adhering to low sodium/DASH diet  Over the next 120 days, patient will demonstrate improved health management independence as evidenced by checking blood pressure as directed and notifying PCP if SBP>160 or DBP > 90, taking all medications as prescribe, and adhering to a low sodium diet as discussed.  Over the next 120 days, patient will verbalize basic understanding of hypertension disease process and self health management plan as evidenced by compliance with plan of  care, and working with CCM team to optimize health and well being.  Interventions:   Evaluation of current treatment plan related to hypertension self management and patient's adherence to plan as established by provider.  Provided education to patient re: stroke prevention, s/s of heart attack and stroke, DASH diet, complications of uncontrolled blood pressure  Reviewed medications with patient and discussed importance of compliance  Discussed plans with patient for ongoing care management follow up and provided patient with direct contact information for care management team  Advised patient, providing education and rationale, to monitor blood pressure daily and record, calling PCP for findings outside established parameters.  Patient Goals/Self-Care Activities  Over the next 120 days, patient will:  - Self administers medications as prescribed Attends all scheduled provider appointments Calls provider office for new concerns, questions, or BP outside discussed parameters Checks BP and records as discussed Follows a low sodium diet/DASH diet - blood pressure trends reviewed - depression screen reviewed - home or ambulatory blood pressure monitoring encouraged Follow Up Plan: Telephone follow up appointment with  care management team member scheduled for: 11-08-2020 at 11:45 am   Task: RNCM: Identify and Monitor Blood Pressure Elevation   Note:   Care Management Activities:    - blood pressure trends reviewed - depression screen reviewed - home or ambulatory blood pressure monitoring encouraged       Patient Care Plan: RNCM: Management of limited mobility: Quadriplegia    Problem Identified: RNCM: Management of Quadriplegia Coping Skills (General Plan of Care)   Priority: High    Goal: RNCM: Management of Quadriplegia   Priority: High  Note:   Current Barriers:   Knowledge Deficits related to caring for patient with quadriplegia   Care Coordination needs related to resources in the community and support for family in a patient with limited mobility/quadriplegia  Chronic Disease Management support and education needs related to patient with quadriplegia  Lacks caregiver support.   Transportation barriers  Difficulty obtaining medications  Unable to independently manage mobility  Unable to self administer medications as prescribed  Lacks social connections  Does not contact provider office for questions/concerns  Nurse Case Manager Clinical Goal(s):   Over the next 120 days, patient will verbalize understanding of plan for optimal care for patient with quadriplegia   Over the next 120 days, patient will work with [facility] team to address needs related to meeting the needs of the patient with limited mobility and quadriplegia   Interventions:   1:1 collaboration with Valerie Roys, DO regarding development and update of comprehensive plan of care as evidenced by provider attestation and co-signature  Inter-disciplinary care team collaboration (see longitudinal plan of care)  Evaluation of current treatment plan related to limited mobility and quadriplegia  and patient's adherence to plan as established by provider.  Advised patient to call the office for changes in  condition and needs.  The patient's wife states that the patient is 6'5" and the hospital bed he has is too short. The wife did ask about another bed. Will collaborate with the pcp about new order for a bed that is longer.   Provided education to patient re: caregiver stress and use of resources to help with caregiver burnout in the care of patient with quadriplegia.  Education also on getting the CCM team LCSW and pharmacist to help with expressed needs and support. Education on talking to the [facility] nurse about swallowing evaluation and working with the OT/PT in the home setting.  Ask the wife to write down questions to ask the home health nurse for needs that will help optimize care in the home.   Reviewed medications with patient and discussed compliance  Discussed plans with patient for ongoing care management follow up and provided patient with direct contact information for care management team  Provided patient with stress reduction educational materials related to caregiver stress and burnout with the care of a quadriplegia patient   Patient Goals/Self-Care Activities Over the next 120days, patient will:  - Patient will attend all scheduled provider appointments Patient will call provider office for new concerns or questions Patient will work with BSW to address care coordination needs  and will continue to work with the clinical team to address health care and disease management related needs.   - blood pressure trends reviewed - depression screen reviewed - home or ambulatory blood pressure monitoring encouraged Follow Up Plan: Telephone follow up appointment with care management team member scheduled for: 11-08-2020 at 11:45 am       Task: RNCM: Support Psychosocial Response to Risk or Actual Health Condition   Note:   Care Management Activities:    - active listening utilized - caregiver stress acknowledged - counseling provided - current coping strategies identified -  decision-making supported - healthy lifestyle promoted - mindfulness encouraged - participation in counseling encouraged - problem-solving facilitated - relaxation techniques promoted - self-reflection promoted - verbalization of feelings encouraged       Patient Care Plan: RNCM: Management of Skin Integrity    Problem Identified: RNCM: Management Skin Integrity as evidence by pressure ulcer with wound vac, trach, peg tube, and eschar bilateral heels   Priority: High    Goal: RNCM: Skin Integrity Maintained   Priority: High  Note:   Current Barriers:   Knowledge Deficits related to resources available in the community to help with meeting the needs of the patient with skin breakdown and quadriplegia   Care Coordination needs related to community resources  in a patient with quadriplegia and impaired skin integrity   Chronic Disease Management support and education needs related to impaired skin integrity and pressure ulcerations   Lacks caregiver support.   Film/video editor.   Transportation barriers  Difficulty obtaining medications  Unable to independently manage care as being dependent due to fall with injury in August of 2021 resulting in [place]   Unable to perform ADLs independently  Unable to perform IADLs independently  Does not contact provider office for questions/concerns  Nurse Case Manager Clinical Goal(s):   Over the next 120 days, patient will verbalize understanding of plan for maintaining skin integrity with no further skin break down due to mobility   Over the next 120 days, patient will work with CCM team, pcp, and specialist  to address needs related to quadriplegic state and current state of skin break down with the potential for new break down   Over the next 120 days, patient will attend all scheduled medical appointments: ENT next Wednesday, other specialist coming up  Over the next 120 days, patient will demonstrate improved health  management independence as evidenced byworking the PT/OT to possibly gain some mobility in hands and arms  Over the next 120 days, patient will work with CM team pharmacist to assist with medication cost and finding alternative pharmacy to meet medication needs  Over the next 120 days, patient will work with CM clinical social worker to assist with coping mechanisms due to traumatic event of fall in August 2021 with resulting quadriplegia   Interventions:   1:1 collaboration with Valerie Roys, DO regarding development and update of comprehensive plan of care as evidenced by provider attestation and co-signature  Inter-disciplinary care team collaboration (see longitudinal plan of care)  Evaluation of current treatment plan related to pressure ulcers, trach care, peg tube care, and bilateral feet ulcerations and patient's adherence to plan as established by provider.  Advised patient to call the office for changes in condition, worsening pressure areas, the need to help facilitate supplies, new concerns or questions.   Provided education to patient re: adequate nutrition and working with home health to optimize care in the home setting.   Reviewed  medications with patient and discussed compliance. The patients wife ask about assistance with baclofen cost and also for a resource to get fluoxetine  Collaborated with CCM team and pcp regarding wife's expressed needs for the patient  Discussed plans with patient for ongoing care management follow up and provided patient with direct contact information for care management team  Provided patient with nutritional  educational materials related to helping maintain skin integrity   Reviewed scheduled/upcoming provider appointments including:   Social Work referral for help with coping after traumatic injury  Pharmacy referral for help with medications cost and medications needs   Patient Goals/Self-Care Activities Over the next 120 days,  patient will:  - Patient will attend all scheduled provider appointments Patient will call pharmacy for medication refills Patient will call provider office for new concerns or questions Patient will work with BSW to address care coordination needs and will continue to work with the clinical team to address health care and disease management related needs.   - adherence to skin care regimen encouraged - gentle skin cleansing encouraged - skin assessed  Follow Up Plan: Telephone follow up appointment with care management team member scheduled for: 11-08-2020 at 11:45 am       Task: RNCM: Support Skin Integrity   Note:   Care Management Activities:    - adherence to skin care regimen encouraged - gentle skin cleansing encouraged - skin assessed         Ryan Day was given information about Chronic Care Management services today including:  1. CCM service includes personalized support from designated clinical staff supervised by his physician, including individualized plan of care and coordination with other care providers 2. 24/7 contact phone numbers for assistance for urgent and routine care needs. 3. Service will only be billed when office clinical staff spend 20 minutes or more in a month to coordinate care. 4. Only one practitioner may furnish and bill the service in a calendar month. 5. The patient may stop CCM services at any time (effective at the end of the month) by phone call to the office staff. 6. The patient will be responsible for cost sharing (co-pay) of up to 20% of the service fee (after annual deductible is met).  Patient agreed to services and verbal consent obtained.   Plan:   Telephone follow up appointment with care management team member scheduled for: 11-08-2020 at 11:45 am  Noreene Larsson RN, MSN, [facility] Mobile: [phone]

## 2020-09-29 ENCOUNTER — Telehealth: Payer: Self-pay

## 2020-09-29 NOTE — Chronic Care Management (AMB) (Signed)
  Chronic Care Management   Note  09/29/2020 Name: Ryan Day MRN: [id number] DOB: 06-03-1951  Ryan Day is a 70 y.o. year old male who is a primary care patient of Valerie Roys, DO. Ryan Day is currently enrolled in care management services. An additional referral for Pharm D and LCSW  was placed.   Follow up plan: Telephone appointment with care management team member scheduled CVE:LFYB 10/06/2020 Pharm D 10/20/2020  Ryan Day, [address], [place], [place] [address] Direct Dial: [phone] [email or web address]: [email or web address]

## 2020-09-29 NOTE — Progress Notes (Signed)
Patient has been scheduled

## 2020-10-02 ENCOUNTER — Telehealth: Payer: Self-pay | Admitting: Family Medicine

## 2020-10-02 ENCOUNTER — Other Ambulatory Visit: Payer: Self-pay | Admitting: Family Medicine

## 2020-10-02 ENCOUNTER — Ambulatory Visit: Payer: Self-pay | Admitting: General Practice

## 2020-10-02 DIAGNOSIS — L89154 Pressure ulcer of sacral region, stage 4: Secondary | ICD-10-CM

## 2020-10-02 DIAGNOSIS — Z930 Tracheostomy status: Secondary | ICD-10-CM

## 2020-10-02 DIAGNOSIS — J690 Pneumonitis due to inhalation of food and vomit: Secondary | ICD-10-CM

## 2020-10-02 DIAGNOSIS — G8250 Quadriplegia, unspecified: Secondary | ICD-10-CM

## 2020-10-02 DIAGNOSIS — I10 Essential (primary) hypertension: Secondary | ICD-10-CM

## 2020-10-02 DIAGNOSIS — Z931 Gastrostomy status: Secondary | ICD-10-CM

## 2020-10-02 DIAGNOSIS — S14109S Unspecified injury at unspecified level of cervical spinal cord, sequela: Secondary | ICD-10-CM

## 2020-10-02 NOTE — Chronic Care Management (AMB) (Signed)
Chronic Care Management   CCM RN Visit Note  10/02/2020 Name: Ryan Day MRN: [id number] DOB: 1951-05-22  Subjective: Ryan Day is a 70 y.o. year old male who is a primary care patient of Valerie Roys, DO. The CCM team was consulted for assistance with chronic disease management and care coordination needs.    Engaged with patient by telephone for follow up visit in response to provider referral for pharmacy case management and/or care coordination services.   Consent to Services:  follow up from VM call  Patient agreed to services and verbal consent obtained.   Assessment/Interventions: Review of patient past medical history, allergies, medications, health status, including review of consultants reports, laboratory and other test data, was performed as part of comprehensive evaluation and provision of chronic care management services.   SDOH (Social Determinants of Health) assessments and interventions performed:    CCM Care Plan  No Known Allergies  Outpatient Encounter Medications as of 10/02/2020  Medication Sig   acetaminophen (TYLENOL) 160 MG/5ML solution Place 10-20 mLs (320-640 mg total) into feeding tube every 4 (four) hours as needed for mild pain.   ascorbic acid (VITAMIN C) 500 MG tablet Place 1 tablet (500 mg total) into feeding tube 2 (two) times daily.   Baclofen 5 MG TABS Place 5-10 mg into feeding tube 3 (three) times daily.   bisacodyl (DULCOLAX) 10 MG suppository Place 1 suppository (10 mg total) rectally at bedtime.   chlorhexidine (PERIDEX) 0.12 % solution 15 mLs by Mouth Rinse route 2 (two) times daily.   collagenase (SANTYL) ointment Apply topically daily.   diclofenac Sodium (VOLTAREN) 1 % GEL Apply 4 g topically 4 (four) times daily.   famotidine (PEPCID) 20 MG tablet Place 1 tablet (20 mg total) into feeding tube daily.   ferrous sulfate 300 (60 Fe) MG/5ML syrup Place 1.3 mLs (78 mg total) into feeding tube daily.   FLUoxetine (PROZAC) 20  MG/5ML solution Place 10 mLs (40 mg total) into feeding tube daily.   guaiFENesin 200 MG tablet Place 2 tablets (400 mg total) into feeding tube every 6 (six) hours.   lidocaine (LIDODERM) 5 % Place 1 patch onto the skin daily. Remove & Discard patch within 12 hours or as directed by MD   midodrine (PROAMATINE) 10 MG tablet Place 1 tablet (10 mg total) into feeding tube 3 (three) times daily with meals.   Mouthwashes (MOUTH RINSE) LIQD solution 15 mLs by Mouth Rinse route 2 times daily at 12 noon and 4 pm.   Multiple Vitamin (MULTIVITAMIN) LIQD Place 5 mLs into feeding tube daily.   nutrition supplement, JUVEN, (JUVEN) PACK Place 1 packet into feeding tube 2 (two) times daily between meals.   Nutritional Supplements (FEEDING SUPPLEMENT, OSMOLITE 1.5 CAL,) LIQD Place 474 mLs into feeding tube 4 (four) times daily.   Nutritional Supplements (FEEDING SUPPLEMENT, PROSOURCE TF,) liquid Place 45 mLs into feeding tube 2 (two) times daily.   ondansetron (ZOFRAN) 4 MG tablet Place 1 tablet (4 mg total) into feeding tube every 8 (eight) hours as needed for nausea, vomiting or refractory nausea / vomiting.   polycarbophil (FIBERCON) 625 MG tablet Place 1 tablet (625 mg total) into feeding tube daily.   rivaroxaban (XARELTO) 10 MG TABS tablet Take one tablet per tube daily   saccharomyces boulardii (FLORASTOR) 250 MG capsule Place 1 capsule (250 mg total) into feeding tube 2 (two) times daily.   sennosides (SENOKOT) 8.8 MG/5ML syrup Place 5 mLs into feeding tube daily at 6 (  six) AM.   simethicone (MYLICON) 40 XI/3.5WY drops Place 1.2 mLs (80 mg total) into feeding tube 4 (four) times daily.   traZODone (DESYREL) 50 MG tablet Place 1 tablet (50 mg total) into feeding tube at bedtime.   Water For Irrigation, Sterile (FREE WATER) SOLN Place 400 mLs into feeding tube every 4 (four) hours. (Patient taking differently: Place 300 mLs into feeding tube every 4 (four) hours.)   zinc sulfate 220 (50 Zn)  MG capsule Place 1 capsule (220 mg total) into feeding tube daily.   No facility-administered encounter medications on file as of 10/02/2020.    Patient Active Problem List   Diagnosis Date Noted   Decubitus ulcer of sacral region, stage 4 ([place])    Hypokalemia 08/15/2020   Hypoalbuminemia 08/15/2020   Leukocytosis 08/15/2020   Pneumonia 08/14/2020   Goals of care, counseling/discussion    Sacral decubitus ulcer, stage II ([place]) 07/25/2020   Aspiration pneumonia ([place]) 07/25/2020   Palliative care by specialist    Salivary secretion    Spasticity    Spinal cord injury, cervical region, sequela ([place]) 06/23/2020   Pressure injury of skin 06/23/2020   S/P percutaneous endoscopic gastrostomy (PEG) tube placement (HCC)    Tracheostomy in place ([place])    Quadriplegia ([place])    Neurogenic orthostatic hypotension (HCC)    Acute on chronic anemia    Hypernatremia    Neurogenic bladder    Neurogenic bowel    Acute on chronic respiratory failure with hypoxia (HCC)    Autonomic instability    Cardiac arrest ([place])    Cervical spinal cord injury, sequela (HCC)    Acute renal injury due to hypovolemia [facility])    Family history of stomach cancer    Exercise-induced leg cramps 05/13/2017   Elevated alkaline phosphatase level 03/13/2015   Hyperlipidemia    Hypertension     Conditions to be addressed/monitored:HTN and skin breakdown- sacral ulcer, quadraplegia   Patient Care Plan: RNCM: Hypertension (Adult)    Problem Identified: RNCM: Hypertension (Hypertension)   Priority: Medium    Goal: RNCM: Hypertension Monitored   Priority: Medium  Note:   Objective:   Last practice recorded BP readings:  BP Readings from Last 3 Encounters:  09/28/20 (!) 153/82  09/05/20 115/62  08/31/20 (!) 102/58     Most recent eGFR/CrCl: No results found for: EGFR  No components found for: CRCL Current Barriers:   Knowledge Deficits related to basic understanding of  hypertension pathophysiology and self care management  Knowledge Deficits related to understanding of medications prescribed for management of hypertension  Difficulty obtaining medications  Transportation barriers  Limited Social Support  Unable to independently manage HTN  Unable to self administer medications as prescribed  Lacks social connections  Does not contact provider office for questions/concerns Case Manager Clinical Goal(s):   Over the next 120 days, patient will verbalize understanding of plan for hypertension management  Over the next 120 days, patient will attend all scheduled medical appointments: ENT 10-04-2020 and wound clinic on 10-12-2020  Over the next 120 days, patient will demonstrate improved adherence to prescribed treatment plan for hypertension as evidenced by taking all medications as prescribed, monitoring and recording blood pressure as directed, adhering to low sodium/DASH diet  Over the next 120 days, patient will demonstrate improved health management independence as evidenced by checking blood pressure as directed and notifying PCP if SBP>160 or DBP > 90, taking all medications as prescribe, and adhering to a low sodium diet as discussed.  Over the next 120 days, patient will verbalize basic understanding of hypertension disease process and self health management plan as evidenced by compliance with plan of  care, and working with CCM team to optimize health and well being.  Interventions:   Evaluation of current treatment plan related to hypertension self management and patient's adherence to plan as established by provider.  Provided education to patient re: stroke prevention, s/s of heart attack and stroke, DASH diet, complications of uncontrolled blood pressure  Reviewed medications with patient and discussed importance of compliance  Discussed plans with patient for ongoing care management follow up and provided patient with direct contact  information for care management team  Advised patient, providing education and rationale, to monitor blood pressure daily and record, calling PCP for findings outside established parameters.  Patient Goals/Self-Care Activities  Over the next 120 days, patient will:  - Self administers medications as prescribed Attends all scheduled provider appointments Calls provider office for new concerns, questions, or BP outside discussed parameters Checks BP and records as discussed Follows a low sodium diet/DASH diet - blood pressure trends reviewed - depression screen reviewed - home or ambulatory blood pressure monitoring encouraged Follow Up Plan: Telephone follow up appointment with care management team member scheduled for: 11-08-2020 at 11:45 am   Task: RNCM: Identify and Monitor Blood Pressure Elevation   Note:   Care Management Activities:    - blood pressure trends reviewed - depression screen reviewed - home or ambulatory blood pressure monitoring encouraged       Patient Care Plan: RNCM: Management of limited mobility: Quadriplegia    Problem Identified: RNCM: Management of Quadriplegia Coping Skills (General Plan of Care)   Priority: High    Goal: RNCM: Management of Quadriplegia   Priority: High  Note:   Current Barriers:   Knowledge Deficits related to caring for patient with quadriplegia   Care Coordination needs related to resources in the community and support for family in a patient with limited mobility/quadriplegia  Chronic Disease Management support and education needs related to patient with quadriplegia  Lacks caregiver support.   Transportation barriers  Difficulty obtaining medications  Unable to independently manage mobility  Unable to self administer medications as prescribed  Lacks social connections  Does not contact provider office for questions/concerns  Nurse Case Manager Clinical Goal(s):   Over the next 120 days, patient will verbalize  understanding of plan for optimal care for patient with quadriplegia   Over the next 120 days, patient will work with [facility] team to address needs related to meeting the needs of the patient with limited mobility and quadriplegia   Interventions:   1:1 collaboration with Valerie Roys, DO regarding development and update of comprehensive plan of care as evidenced by provider attestation and co-signature  Inter-disciplinary care team collaboration (see longitudinal plan of care)  Evaluation of current treatment plan related to limited mobility and quadriplegia  and patient's adherence to plan as established by provider.  Advised patient to call the office for changes in condition and needs.  The patient's wife states that the patient is 6'5" and the hospital bed he has is too short. The wife did ask about another bed. Will collaborate with the pcp about new order for a bed that is longer. 10-02-2020: The patients wife had left a VM on 09-29-2020 asking about a bed extension. Cal to [facility] and spoke with Lillia Mountain with [facility]. She was ordering the extension and would have  it delivered to the home. Information provided to the patient's wife today that the extension had been ordered and would be delivered to the home when it was available. The wife verbalized understanding.   Provided education to patient re: caregiver stress and use of resources to help with caregiver burnout in the care of patient with quadriplegia.  Education also on getting the CCM team LCSW and pharmacist to help with expressed needs and support. Education on talking to the [facility] nurse about swallowing evaluation and working with the OT/PT in the home setting.  Ask the wife to write down questions to ask the home health nurse for needs that will help optimize care in the home. 10-02-2020: Collaboration with pcp concerning speech evaluation. The patient has had one in October and November of 2021. Recommendation for  therapy to strengthen muscles in throat. Education given to the patients wife. The patients wife feels therapy would be beneficial for the patient.   Reviewed medications with patient and discussed compliance  Discussed plans with patient for ongoing care management follow up and provided patient with direct contact information for care management team  Provided patient with stress reduction educational materials related to caregiver stress and burnout with the care of a quadriplegia patient   Patient Goals/Self-Care Activities Over the next 120days, patient will:  - Patient will attend all scheduled provider appointments Patient will call provider office for new concerns or questions Patient will work with BSW to address care coordination needs and will continue to work with the clinical team to address health care and disease management related needs.   - blood pressure trends reviewed - depression screen reviewed - home or ambulatory blood pressure monitoring encouraged Follow Up Plan: Telephone follow up appointment with care management team member scheduled for: 11-08-2020 at 11:45 am       Task: RNCM: Support Psychosocial Response to Risk or Actual Health Condition   Note:   Care Management Activities:    - active listening utilized - caregiver stress acknowledged - counseling provided - current coping strategies identified - decision-making supported - healthy lifestyle promoted - mindfulness encouraged - participation in counseling encouraged - problem-solving facilitated - relaxation techniques promoted - self-reflection promoted - verbalization of feelings encouraged       Patient Care Plan: RNCM: Management of Skin Integrity    Problem Identified: RNCM: Management Skin Integrity as evidence by pressure ulcer with wound vac, trach, peg tube, and eschar bilateral heels   Priority: High    Goal: RNCM: Skin Integrity Maintained   Priority: High  Note:   Current  Barriers:   Knowledge Deficits related to resources available in the community to help with meeting the needs of the patient with skin breakdown and quadriplegia   Care Coordination needs related to community resources  in a patient with quadriplegia and impaired skin integrity   Chronic Disease Management support and education needs related to impaired skin integrity and pressure ulcerations   Lacks caregiver support.   Film/video editor.   Transportation barriers  Difficulty obtaining medications  Unable to independently manage care as being dependent due to fall with injury in August of 2021 resulting in [place]   Unable to perform ADLs independently  Unable to perform IADLs independently  Does not contact provider office for questions/concerns  Nurse Case Manager Clinical Goal(s):   Over the next 120 days, patient will verbalize understanding of plan for maintaining skin integrity with no further skin break down due to mobility   Over  the next 120 days, patient will work with CCM team, pcp, and specialist  to address needs related to quadriplegic state and current state of skin break down with the potential for new break down   Over the next 120 days, patient will attend all scheduled medical appointments: ENT next Wednesday, other specialist coming up  Over the next 120 days, patient will demonstrate improved health management independence as evidenced byworking the PT/OT to possibly gain some mobility in hands and arms  Over the next 120 days, patient will work with CM team pharmacist to assist with medication cost and finding alternative pharmacy to meet medication needs  Over the next 120 days, patient will work with CM clinical social worker to assist with coping mechanisms due to traumatic event of fall in August 2021 with resulting quadriplegia   Interventions:   1:1 collaboration with Valerie Roys, DO regarding development and update of comprehensive  plan of care as evidenced by provider attestation and co-signature  Inter-disciplinary care team collaboration (see longitudinal plan of care)  Evaluation of current treatment plan related to pressure ulcers, trach care, peg tube care, and bilateral feet ulcerations and patient's adherence to plan as established by provider.  Advised patient to call the office for changes in condition, worsening pressure areas, the need to help facilitate supplies, new concerns or questions. 1-10-202: The patients wife ask about the virtual appointment that was set up through [facility] and if she needed to cancel the appointment with the wound provider on Wednesday. Advised the patients wife to keep the appointment on Wednesday with the specialist.   Provided education to patient re: adequate nutrition and working with home health to optimize care in the home setting.   Reviewed medications with patient and discussed compliance. The patients wife ask about assistance with baclofen cost and also for a resource to get fluoxetine  Collaborated with CCM team and pcp regarding wife's expressed needs for the patient  Discussed plans with patient for ongoing care management follow up and provided patient with direct contact information for care management team  Provided patient with nutritional  educational materials related to helping maintain skin integrity   Reviewed scheduled/upcoming provider appointments including:   Social Work referral for help with coping after traumatic injury  Pharmacy referral for help with medications cost and medications needs   Patient Goals/Self-Care Activities Over the next 120 days, patient will:  - Patient will attend all scheduled provider appointments Patient will call pharmacy for medication refills Patient will call provider office for new concerns or questions Patient will work with BSW to address care coordination needs and will continue to work with the clinical team to  address health care and disease management related needs.   - adherence to skin care regimen encouraged - gentle skin cleansing encouraged - skin assessed  Follow Up Plan: Telephone follow up appointment with care management team member scheduled for: 11-08-2020 at 11:45 am       Task: RNCM: Support Skin Integrity   Note:   Care Management Activities:    - adherence to skin care regimen encouraged - gentle skin cleansing encouraged - skin assessed         Plan:Telephone follow up appointment with care management team member scheduled for:  11-08-2020  Noreene Larsson RN, MSN, [facility] Mobile: [phone]

## 2020-10-02 NOTE — Telephone Encounter (Signed)
OK for verbals 

## 2020-10-02 NOTE — Telephone Encounter (Signed)
Called and LVM letting Aldren know that Dr. Wynetta Emery gave the ok for verbal orders requested.

## 2020-10-02 NOTE — Telephone Encounter (Signed)
Aldren with [facility] requesting verbal PT orders 2 wk 4  Also requesting nurse eval and ST eval   cb  [phone]  This is a secure line and OK to leave VM.

## 2020-10-02 NOTE — Patient Instructions (Signed)
Visit Information  Patient Care Plan: RNCM: Hypertension (Adult)    Problem Identified: RNCM: Hypertension (Hypertension)   Priority: Medium    Goal: RNCM: Hypertension Monitored   Priority: Medium  Note:   Objective:  . Last practice recorded BP readings:  BP Readings from Last 3 Encounters:  09/28/20 (!) 153/82  09/05/20 115/62  08/31/20 (!) 102/58 .   Marland Kitchen Most recent eGFR/CrCl: No results found for: EGFR  No components found for: CRCL Current Barriers:  Marland Kitchen Knowledge Deficits related to basic understanding of hypertension pathophysiology and self care management . Knowledge Deficits related to understanding of medications prescribed for management of hypertension . Difficulty obtaining medications . Transportation barriers . Limited Social Support . Unable to independently manage HTN . Unable to self administer medications as prescribed . Lacks social connections . Does not contact provider office for questions/concerns Case Manager Clinical Goal(s):  Marland Kitchen Over the next 120 days, patient will verbalize understanding of plan for hypertension management . Over the next 120 days, patient will attend all scheduled medical appointments: ENT 10-04-2020 and wound clinic on 10-12-2020 . Over the next 120 days, patient will demonstrate improved adherence to prescribed treatment plan for hypertension as evidenced by taking all medications as prescribed, monitoring and recording blood pressure as directed, adhering to low sodium/DASH diet . Over the next 120 days, patient will demonstrate improved health management independence as evidenced by checking blood pressure as directed and notifying PCP if SBP>160 or DBP > 90, taking all medications as prescribe, and adhering to a low sodium diet as discussed. . Over the next 120 days, patient will verbalize basic understanding of hypertension disease process and self health management plan as evidenced by compliance with plan of  care, and working with CCM  team to optimize health and well being.  Interventions:  . Evaluation of current treatment plan related to hypertension self management and patient's adherence to plan as established by provider. . Provided education to patient re: stroke prevention, s/s of heart attack and stroke, DASH diet, complications of uncontrolled blood pressure . Reviewed medications with patient and discussed importance of compliance . Discussed plans with patient for ongoing care management follow up and provided patient with direct contact information for care management team . Advised patient, providing education and rationale, to monitor blood pressure daily and record, calling PCP for findings outside established parameters.  Patient Goals/Self-Care Activities . Over the next 120 days, patient will:  - Self administers medications as prescribed Attends all scheduled provider appointments Calls provider office for new concerns, questions, or BP outside discussed parameters Checks BP and records as discussed Follows a low sodium diet/DASH diet - blood pressure trends reviewed - depression screen reviewed - home or ambulatory blood pressure monitoring encouraged Follow Up Plan: Telephone follow up appointment with care management team member scheduled for: 11-08-2020 at 11:45 am   Task: RNCM: Identify and Monitor Blood Pressure Elevation   Note:   Care Management Activities:    - blood pressure trends reviewed - depression screen reviewed - home or ambulatory blood pressure monitoring encouraged       Patient Care Plan: RNCM: Management of limited mobility: Quadriplegia    Problem Identified: RNCM: Management of Quadriplegia Coping Skills (General Plan of Care)   Priority: High    Goal: RNCM: Management of Quadriplegia   Priority: High  Note:   Current Barriers:  Marland Kitchen Knowledge Deficits related to caring for patient with quadriplegia  . Care Coordination needs related to resources in the  community and  support for family in a patient with limited mobility/quadriplegia . Chronic Disease Management support and education needs related to patient with quadriplegia . Lacks caregiver support.  . Transportation barriers . Difficulty obtaining medications . Unable to independently manage mobility . Unable to self administer medications as prescribed . Lacks social connections . Does not contact provider office for questions/concerns  Nurse Case Manager Clinical Goal(s):  Marland Kitchen Over the next 120 days, patient will verbalize understanding of plan for optimal care for patient with quadriplegia  . Over the next 120 days, patient will work with [facility] [place] team to address needs related to meeting the needs of the patient with limited mobility and quadriplegia   Interventions:  . 1:1 collaboration with Valerie Roys, DO regarding development and update of comprehensive plan of care as evidenced by provider attestation and co-signature . Inter-disciplinary care team collaboration (see longitudinal plan of care) . Evaluation of current treatment plan related to limited mobility and quadriplegia  and patient's adherence to plan as established by provider. . Advised patient to call the office for changes in condition and needs.  The patient's wife states that the patient is 6'5" and the hospital bed he has is too short. The wife did ask about another bed. Will collaborate with the pcp about new order for a bed that is longer. 10-02-2020: The patients wife had left a VM on 09-29-2020 asking about a bed extension. Cal to [facility] and spoke with Lillia Mountain with [facility]. She was ordering the extension and would have it delivered to the home. Information provided to the patient's wife today that the extension had been ordered and would be delivered to the home when it was available. The wife verbalized understanding.  . Provided education to patient re: caregiver stress and use of resources to help with  caregiver burnout in the care of patient with quadriplegia.  Education also on getting the CCM team LCSW and pharmacist to help with expressed needs and support. Education on talking to the [facility] nurse about swallowing evaluation and working with the OT/PT in the home setting.  Ask the wife to write down questions to ask the home health nurse for needs that will help optimize care in the home. 10-02-2020: Collaboration with pcp concerning speech evaluation. The patient has had one in October and November of 2021. Recommendation for therapy to strengthen muscles in throat. Education given to the patients wife. The patients wife feels therapy would be beneficial for the patient.  . Reviewed medications with patient and discussed compliance . Discussed plans with patient for ongoing care management follow up and provided patient with direct contact information for care management team . Provided patient with stress reduction educational materials related to caregiver stress and burnout with the care of a quadriplegia patient   Patient Goals/Self-Care Activities Over the next 120days, patient will:  - Patient will attend all scheduled provider appointments Patient will call provider office for new concerns or questions Patient will work with BSW to address care coordination needs and will continue to work with the clinical team to address health care and disease management related needs.   - blood pressure trends reviewed - depression screen reviewed - home or ambulatory blood pressure monitoring encouraged Follow Up Plan: Telephone follow up appointment with care management team member scheduled for: 11-08-2020 at 11:45 am       Task: RNCM: Support Psychosocial Response to Risk or Actual Health Condition   Note:  Care Management Activities:    - active listening utilized - caregiver stress acknowledged - counseling provided - current coping strategies identified - decision-making supported -  healthy lifestyle promoted - mindfulness encouraged - participation in counseling encouraged - problem-solving facilitated - relaxation techniques promoted - self-reflection promoted - verbalization of feelings encouraged       Patient Care Plan: RNCM: Management of Skin Integrity    Problem Identified: RNCM: Management Skin Integrity as evidence by pressure ulcer with wound vac, trach, peg tube, and eschar bilateral heels   Priority: High    Goal: RNCM: Skin Integrity Maintained   Priority: High  Note:   Current Barriers:  Marland Kitchen Knowledge Deficits related to resources available in the community to help with meeting the needs of the patient with skin breakdown and quadriplegia  . Care Coordination needs related to community resources  in a patient with quadriplegia and impaired skin integrity  . Chronic Disease Management support and education needs related to impaired skin integrity and pressure ulcerations  . Lacks caregiver support.  . Film/video editor.  . Transportation barriers . Difficulty obtaining medications . Unable to independently manage care as being dependent due to fall with injury in August of 2021 resulting in [place]  . Unable to perform ADLs independently . Unable to perform IADLs independently . Does not contact provider office for questions/concerns  Nurse Case Manager Clinical Goal(s):  Marland Kitchen Over the next 120 days, patient will verbalize understanding of plan for maintaining skin integrity with no further skin break down due to mobility  . Over the next 120 days, patient will work with CCM team, pcp, and specialist  to address needs related to quadriplegic state and current state of skin break down with the potential for new break down  . Over the next 120 days, patient will attend all scheduled medical appointments: ENT next Wednesday, other specialist coming up . Over the next 120 days, patient will demonstrate improved health management independence as  evidenced byworking the PT/OT to possibly gain some mobility in hands and arms . Over the next 120 days, patient will work with CM team pharmacist to assist with medication cost and finding alternative pharmacy to meet medication needs . Over the next 120 days, patient will work with CM clinical social worker to assist with coping mechanisms due to traumatic event of fall in August 2021 with resulting quadriplegia   Interventions:  . 1:1 collaboration with Valerie Roys, DO regarding development and update of comprehensive plan of care as evidenced by provider attestation and co-signature . Inter-disciplinary care team collaboration (see longitudinal plan of care) . Evaluation of current treatment plan related to pressure ulcers, trach care, peg tube care, and bilateral feet ulcerations and patient's adherence to plan as established by provider. . Advised patient to call the office for changes in condition, worsening pressure areas, the need to help facilitate supplies, new concerns or questions. 1-10-202: The patients wife ask about the virtual appointment that was set up through [facility] and if she needed to cancel the appointment with the wound provider on Wednesday. Advised the patients wife to keep the appointment on Wednesday with the specialist.  . Provided education to patient re: adequate nutrition and working with home health to optimize care in the home setting.  . Reviewed medications with patient and discussed compliance. The patients wife ask about assistance with baclofen cost and also for a resource to get fluoxetine . Collaborated with CCM team and pcp regarding wife's expressed needs for  the patient . Discussed plans with patient for ongoing care management follow up and provided patient with direct contact information for care management team . Provided patient with nutritional  educational materials related to helping maintain skin integrity  . Reviewed scheduled/upcoming provider  appointments including:  . Social Work referral for help with coping after traumatic injury . Pharmacy referral for help with medications cost and medications needs   Patient Goals/Self-Care Activities Over the next 120 days, patient will:  - Patient will attend all scheduled provider appointments Patient will call pharmacy for medication refills Patient will call provider office for new concerns or questions Patient will work with BSW to address care coordination needs and will continue to work with the clinical team to address health care and disease management related needs.   - adherence to skin care regimen encouraged - gentle skin cleansing encouraged - skin assessed  Follow Up Plan: Telephone follow up appointment with care management team member scheduled for: 11-08-2020 at 11:45 am       Task: RNCM: Support Skin Integrity   Note:   Care Management Activities:    - adherence to skin care regimen encouraged - gentle skin cleansing encouraged - skin assessed         The patient verbalized understanding of instructions, educational materials, and care plan provided today and declined offer to receive copy of patient instructions, educational materials, and care plan.   Telephone follow up appointment with care management team member scheduled for: 11-08-2020  Vanita Ingles

## 2020-10-03 ENCOUNTER — Telehealth: Payer: Self-pay

## 2020-10-03 NOTE — Telephone Encounter (Signed)
Routing to provider to advise.  

## 2020-10-03 NOTE — Telephone Encounter (Signed)
[name] [name] is calling back stating that today is the day that tonya's fmla runs out wanted to know if anything was ready. Please advise  Copied from [place] [phone]. Topic: General - Other >> Oct 03, 2020  3:46 PM Leward Quan A wrote: Reason for CRM: Patient wife called in asking if Dr Wynetta Emery could please write a letter to get a 2 month extension on her FMLA because she need help taking care of the patient. This letter is needed for [facility] and this is needed to be picked up on 10/04/20 due to [place] running out on 10/05/20. Please call Mr Radin with an answer at Ph# [phone]

## 2020-10-04 ENCOUNTER — Encounter: Payer: BC Managed Care – PPO | Attending: Internal Medicine | Admitting: Internal Medicine

## 2020-10-04 ENCOUNTER — Other Ambulatory Visit: Payer: Self-pay | Admitting: Family Medicine

## 2020-10-04 ENCOUNTER — Other Ambulatory Visit: Payer: Self-pay

## 2020-10-04 DIAGNOSIS — L89312 Pressure ulcer of right buttock, stage 2: Secondary | ICD-10-CM | POA: Diagnosis not present

## 2020-10-04 DIAGNOSIS — L89612 Pressure ulcer of right heel, stage 2: Secondary | ICD-10-CM | POA: Diagnosis not present

## 2020-10-04 DIAGNOSIS — G8250 Quadriplegia, unspecified: Secondary | ICD-10-CM | POA: Insufficient documentation

## 2020-10-04 DIAGNOSIS — L89622 Pressure ulcer of left heel, stage 2: Secondary | ICD-10-CM | POA: Diagnosis not present

## 2020-10-04 DIAGNOSIS — L89154 Pressure ulcer of sacral region, stage 4: Secondary | ICD-10-CM | POA: Diagnosis not present

## 2020-10-05 ENCOUNTER — Telehealth: Payer: Self-pay | Admitting: General Practice

## 2020-10-05 ENCOUNTER — Ambulatory Visit: Payer: Self-pay | Admitting: *Deleted

## 2020-10-05 NOTE — Telephone Encounter (Signed)
Please see other TE on same subject. This is not something we can do in this office.

## 2020-10-05 NOTE — Telephone Encounter (Signed)
If home health cannot do this, they will need to go to the ER. This is not something we can do in this office.

## 2020-10-05 NOTE — Telephone Encounter (Signed)
This is not something we can do. Does he have a pulmonologist?

## 2020-10-05 NOTE — Telephone Encounter (Signed)
Patient wife aware of providers advise.

## 2020-10-05 NOTE — Telephone Encounter (Signed)
Pts wife and daughter called about the FMLA extension letter / they asked for this to be able to be picked up today/ it is the day that [employer] ends and she needs letter asap / please advise

## 2020-10-05 NOTE — Telephone Encounter (Cosign Needed)
  Chronic Care Management   Note  10/05/2020 Name: Rook Maue MRN: [id number] DOB: Apr 24, 1951  Voicemail received from the patients wife. The wife is concerned because the patient has sputum coming up and she wants to know who she can call to get the sputum tested to see if the patient has an infection. The wife is requesting a call back with recommendations.   Please advise.     Noreene Larsson RN, MSN, [facility] Mobile: [phone]

## 2020-10-05 NOTE — Telephone Encounter (Signed)
Requested medications are due for refill today yes  Requested medications are on the active medication list yes  Last refill 12/31  Last visit 12/14  Future visit scheduled 10/05/20 (today)  Notes to clinic Not Delegated.

## 2020-10-05 NOTE — Telephone Encounter (Signed)
Wife states he does not have a pulmonologist. Stated last time this happened to him he had to go to ER.

## 2020-10-05 NOTE — Telephone Encounter (Signed)
[name] called in concerned that her husband who is quadriplegic and bedridden with a tracheostomy is having thick yellow sputum when they suction his trach that started yesterday.  She is wanting to know if someone can come to the house since he is bedridden and obtain a sputum specimen.   He is very prone to pneumonia and they don't want him back in the hospital if it can be helped.  See triage notes.  I have sent a high priority note to [facility] for Dr. Wynetta Emery.   Reason for Disposition . Coughing up rusty-colored (reddish-brown) sputum    Has tracheostomy and they are getting thick yellow sputum when they suction it.  Answer Assessment - Initial Assessment Questions 1. ONSET: "When did the cough begin?"      Wife calling in.   He has a tracheostomy with yellow mucus coming from it.   Daughter is suctioning him and it's thick yellow.   He needs a sputum test done.   Can someone come here and do a sputum specimen?   [facility]  2. SEVERITY: "How bad is the cough today?"      He is coughing fine via the trach 3. SPUTUM: "Describe the color of your sputum" (none, dry cough; clear, white, yellow, green)     Thick yellow that started yesterday.    He has had pneumonia several times in the past.  4. HEMOPTYSIS: "Are you coughing up any blood?" If so ask: "How much?" (flecks, streaks, tablespoons, etc.)     No 5. DIFFICULTY BREATHING: "Are you having difficulty breathing?" If Yes, ask: "How bad is it?" (e.g., mild, moderate, severe)    - MILD: No SOB at rest, mild SOB with walking, speaks normally in sentences, can lay down, no retractions, pulse < 100.    - MODERATE: SOB at rest, SOB with minimal exertion and prefers to sit, cannot lie down flat, speaks in phrases, mild retractions, audible wheezing, pulse 100-120.    - SEVERE: Very SOB at rest, speaks in single words, struggling to breathe, sitting hunched forward, retractions, pulse > 120      Breathing  and vital signs are good.  Oxygen level is good. 6. FEVER: "Do you have a fever?" If Yes, ask: "What is your temperature, how was it measured, and when did it start?"     No 7. CARDIAC HISTORY: "Do you have any history of heart disease?" (e.g., heart attack, congestive heart failure)      *No Answer* 8. 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 9. PE RISK FACTORS: "Do you have a history of blood clots?" (or: recent major surgery, recent prolonged travel, bedridden)     He is bedridden 10. OTHER SYMPTOMS: "Do you have any other symptoms?" (e.g., runny nose, wheezing, chest pain)       No fever, coughing or shortness of breath.  All his vital signs and oxygen level are good.  "We are keeping a close watch on him don't want him to get pneumonia again" per wife. 11. PREGNANCY: "Is there any chance you are pregnant?" "When was your last menstrual period?"       N/A 12. TRAVEL: "Have you traveled out of the country in the last month?" (e.g., travel history, exposures)       No  Protocols used: [place]

## 2020-10-06 ENCOUNTER — Encounter: Payer: Self-pay | Admitting: Family Medicine

## 2020-10-06 ENCOUNTER — Ambulatory Visit: Payer: BC Managed Care – PPO | Admitting: Licensed Clinical Social Worker

## 2020-10-06 ENCOUNTER — Telehealth (INDEPENDENT_AMBULATORY_CARE_PROVIDER_SITE_OTHER): Payer: BC Managed Care – PPO | Admitting: Family Medicine

## 2020-10-06 VITALS — BP 149/84 | HR 81 | Temp 97.4°F

## 2020-10-06 DIAGNOSIS — J40 Bronchitis, not specified as acute or chronic: Secondary | ICD-10-CM

## 2020-10-06 DIAGNOSIS — G8250 Quadriplegia, unspecified: Secondary | ICD-10-CM

## 2020-10-06 DIAGNOSIS — I10 Essential (primary) hypertension: Secondary | ICD-10-CM

## 2020-10-06 DIAGNOSIS — Z930 Tracheostomy status: Secondary | ICD-10-CM

## 2020-10-06 DIAGNOSIS — E785 Hyperlipidemia, unspecified: Secondary | ICD-10-CM

## 2020-10-06 MED ORDER — AZITHROMYCIN 250 MG PO TABS: ORAL_TABLET | ORAL | 0 refills | Status: DC

## 2020-10-06 NOTE — Chronic Care Management (AMB) (Signed)
Chronic Care Management    Clinical Social Work Note  10/06/2020 Name: Ryan Day MRN: [id number] DOB: [birth year]  Ryan Day is a 70 y.o. year old male who is a primary care patient of Valerie Roys, DO. The CCM team was consulted to assist the patient with chronic disease management and/or care coordination needs related to: Level of Care Concerns.   Engaged with patient by telephone for initial visit in response to provider referral for social work chronic care management and care coordination services.   Consent to Services:  The patient was given the following information about Chronic Care Management services today, agreed to services, and gave verbal consent: 1. CCM service includes personalized support from designated clinical staff supervised by the primary care provider, including individualized plan of care and coordination with other care providers 2. 24/7 contact phone numbers for assistance for urgent and routine care needs. 3. Service will only be billed when office clinical staff spend 20 minutes or more in a month to coordinate care. 4. Only one practitioner may furnish and bill the service in a calendar month. 5.The patient may stop CCM services at any time (effective at the end of the month) by phone call to the office staff. 6. The patient will be responsible for cost sharing (co-pay) of up to 20% of the service fee (after annual deductible is met). Patient agreed to services and consent obtained.  Patient agreed to services and consent obtained.   Assessment: Review of patient past medical history, allergies, medications, and health status, including review of relevant consultants reports was performed today as part of a comprehensive evaluation and provision of chronic care management and care coordination services.     SDOH (Social Determinants of Health) assessments and interventions performed:    Advanced Directives Status: See Care Plan for related entries.  CCM  Care Plan  No Known Allergies  Outpatient Encounter Medications as of 10/06/2020  Medication Sig  . acetaminophen (TYLENOL) 160 MG/5ML solution Place 10-20 mLs (320-640 mg total) into feeding tube every 4 (four) hours as needed for mild pain.  Marland Kitchen ascorbic acid (VITAMIN C) 500 MG tablet Place 1 tablet (500 mg total) into feeding tube 2 (two) times daily.  . Baclofen 5 MG TABS PLACE 1 TO 2 TABLETS INTO THE FEEDING TUBE THREE TIMES DAILY  . bisacodyl (DULCOLAX) 10 MG suppository Place 1 suppository (10 mg total) rectally at bedtime.  . chlorhexidine (PERIDEX) 0.12 % solution 15 mLs by Mouth Rinse route 2 (two) times daily.  . collagenase (SANTYL) ointment Apply topically daily.  . diclofenac Sodium (VOLTAREN) 1 % GEL Apply 4 g topically 4 (four) times daily.  . famotidine (PEPCID) 20 MG tablet Place 1 tablet (20 mg total) into feeding tube daily.  . ferrous sulfate 300 (60 Fe) MG/5ML syrup Place 1.3 mLs (78 mg total) into feeding tube daily.  Marland Kitchen FLUoxetine (PROZAC) 20 MG/5ML solution Place 10 mLs (40 mg total) into feeding tube daily.  Marland Kitchen guaiFENesin 200 MG tablet Place 2 tablets (400 mg total) into feeding tube every 6 (six) hours.  . lidocaine (LIDODERM) 5 % Place 1 patch onto the skin daily. Remove & Discard patch within 12 hours or as directed by MD  . midodrine (PROAMATINE) 10 MG tablet Place 1 tablet (10 mg total) into feeding tube 3 (three) times daily with meals.  . Mouthwashes (MOUTH RINSE) LIQD solution 15 mLs by Mouth Rinse route 2 times daily at 12 noon and 4 pm.  . Multiple Vitamin (  MULTIVITAMIN) LIQD Place 5 mLs into feeding tube daily.  . nutrition supplement, JUVEN, (JUVEN) PACK Place 1 packet into feeding tube 2 (two) times daily between meals.  . Nutritional Supplements (FEEDING SUPPLEMENT, OSMOLITE 1.5 CAL,) LIQD Place 474 mLs into feeding tube 4 (four) times daily.  . Nutritional Supplements (FEEDING SUPPLEMENT, PROSOURCE TF,) liquid Place 45 mLs into feeding tube 2 (two) times  daily.  . ondansetron (ZOFRAN) 4 MG tablet Place 1 tablet (4 mg total) into feeding tube every 8 (eight) hours as needed for nausea, vomiting or refractory nausea / vomiting.  . polycarbophil (FIBERCON) 625 MG tablet Place 1 tablet (625 mg total) into feeding tube daily.  . rivaroxaban (XARELTO) 10 MG TABS tablet Take one tablet per tube daily  . saccharomyces boulardii (FLORASTOR) 250 MG capsule Place 1 capsule (250 mg total) into feeding tube 2 (two) times daily.  . sennosides (SENOKOT) 8.8 MG/5ML syrup Place 5 mLs into feeding tube daily at 6 (six) AM.  . simethicone (MYLICON) 40 PP/5.0DT drops Place 1.2 mLs (80 mg total) into feeding tube 4 (four) times daily.  . traZODone (DESYREL) 50 MG tablet Place 1 tablet (50 mg total) into feeding tube at bedtime.  . Water For Irrigation, Sterile (FREE WATER) SOLN Place 400 mLs into feeding tube every 4 (four) hours. (Patient taking differently: Place 300 mLs into feeding tube every 4 (four) hours.)  . zinc sulfate 220 (50 Zn) MG capsule Place 1 capsule (220 mg total) into feeding tube daily.   No facility-administered encounter medications on file as of 10/06/2020.    Patient Active Problem List   Diagnosis Date Noted  . Decubitus ulcer of sacral region, stage 4 ([place])   . Hypokalemia 08/15/2020  . Hypoalbuminemia 08/15/2020  . Leukocytosis 08/15/2020  . Pneumonia 08/14/2020  . Goals of care, counseling/discussion   . Sacral decubitus ulcer, stage II ([place]) 07/25/2020  . Aspiration pneumonia ([place]) 07/25/2020  . Palliative care by specialist   . Salivary secretion   . Spasticity   . Spinal cord injury, cervical region, sequela ([place]) 06/23/2020  . Pressure injury of skin 06/23/2020  . S/P percutaneous endoscopic gastrostomy (PEG) tube placement ([place])   . Tracheostomy in place [facility])   . Quadriplegia ([place])   . Neurogenic orthostatic hypotension ([place])   . Acute on chronic anemia   . Hypernatremia   . Neurogenic bladder   . Neurogenic bowel   .  Acute on chronic respiratory failure with hypoxia ([place])   . Autonomic instability   . Cardiac arrest ([place])   . Cervical spinal cord injury, sequela ([place])   . Acute renal injury due to hypovolemia ([place])   . Family history of stomach cancer   . Exercise-induced leg cramps 05/13/2017  . Elevated alkaline phosphatase level 03/13/2015  . Hyperlipidemia   . Hypertension     Care Plan : General Social Work (Adult)  Updates made by Greg Cutter, LCSW since 10/06/2020 12:00 AM    Problem: Quality of Life (General Plan of Care)     Goal: Quality of Life Maintained   Start Date: 10/06/2020  Note:   Evidence-based guidance:   Assess patient's thoughts about quality of life, goals and expectations, and dissatisfaction or desire to improve.   Identify issues of primary importance such as mental health, illness, exercise tolerance, pain, sexual function and intimacy, cognitive change, social isolation, finances and relationships.   Assess and monitor for signs/symptoms of psychosocial concerns, especially depression or ideations regarding harm to others or  self; provide or refer for mental health services as needed.   Identify sensory issues that impact quality of life such as hearing loss, vision deficit; strategize ways to maintain or improve hearing, vision.   Promote access to services in the community to support independence such as support groups, home visiting programs, financial assistance, handicapped parking tags, durable medical equipment and emergency responder.   Promote activities to decrease social isolation such as group support or social, leisure and recreational activities, employment, use of social media; consider safety concerns about being out of home for activities.   Provide patient an opportunity to share by storytelling or a "life review" to give positive meaning to life and to assist with coping and negative experiences.   Encourage patient to tap into hope to improve  sense of self.   Counsel based on prognosis and as early as possible about end-of-life and palliative care; consider referral to palliative care provider.   Advocate for the development of palliative care plan that may include avoidance of unnecessary testing and intervention, symptom control, discontinuation of medications, hospice and organ donation.   Counsel as early as possible those with life-limiting chronic disease about palliative care; consider referral to palliative care provider.   Advocate for the development of palliative care plan.   Notes:   Timeframe:  Short-Term Goal Priority:  High Start Date:  10/06/20                           Expected End Date: 10/06/20                       - begin a notebook of services in my neighborhood or community - call 211 when I need some help - follow-up on any referrals for help I am given - think ahead to make sure my need does not become an emergency - make a note about what I need to have by the phone or take with me, like an identification card or social security number have a back-up plan - have a back-up plan - make a list of family or friends that I can call    Why is this important?    Knowing how and where to find help for yourself or family in your neighborhood and community is an important skill.   You will want to take some steps to learn how.   Current barriers:  Patient unable to consistently perform activities of daily living and needs additional assistance and support in order to meet this unmet need . Limited social support, Level of care concerns, ADL IADL limitations, and Social Isolation . Lacks social connections  Clinical Goals:  Over the next 30 days, patient will work with CCM team and PCP to address needs related to lack of support within the home Interventions : . Assessed needs, level of care concerns, basic eligibility and provided education on available PCS resources within her area . Reviewed community  support options ( CAP, private pay, PACE program) . Collaborated with primary care provider regarding patient needs-[name] called in concerned that her husband who is quadriplegic and bedridden with a tracheostomy is having thick yellow sputum when they suction his trach that started yesterday. PCP reports that they will need to go to the ER as she is unable to address this concern in the office and they do not have a pulmonologist. Family does not wish to take patient to the ER  due to COVID. CCM LCSW will coordinate care with PCP.  Marland Kitchen Family deny any current mental health concerns or social work needs . Patient interviewed and appropriate assessments performed . Discussed plans with patient for ongoing care management follow up and provided patient with direct contact information for care management team . Assisted patient/caregiver with obtaining information about health plan benefits . Provided education and assistance to client regarding Advanced Directives. . Provided education to patient/caregiver regarding level of care options. . Provided education to patient/caregiver about Hospice and/or Palliative Care services . Other interventions provided: Emotional/Supportive Counseling . collaboration with PCP regarding development and update of comprehensive plan of care as evidenced by provider attestation and co-signature . Inter-disciplinary care team collaboration (see longitudinal plan of care) . Collaborated with appropriate clinical care team members regarding patient needs . Family was appreciative of CCM LCSW's call but report that their concerns are only related to his physical health not his mental health. Family decline the need for social work follow up. CCM LCSW will close current referral.      Follow Up Plan: Family will contact CCM LCSW directly if future social work needs arise.       Eula Fried, BSW, MSW, [facility] [email or web address] Phone: [phone]

## 2020-10-06 NOTE — Telephone Encounter (Signed)
Called linda pt's wife, the letter that is in [place] is for the other daughter. It needs to be for [facility] not [name].

## 2020-10-06 NOTE — Addendum Note (Signed)
Addended by: Valerie Roys on: 10/06/2020 09:58 AM   Modules accepted: Orders

## 2020-10-06 NOTE — Telephone Encounter (Signed)
Letter written and on her mychart. I will not be able to sign it until I am back in the office.

## 2020-10-06 NOTE — Progress Notes (Signed)
BP (!) 149/84   Pulse 81   Temp (!) 97.4 F (36.3 C) (Oral)   SpO2 97%    Subjective:    Patient ID: Ryan Day, male    DOB: 10/26/1950, 70 y.o.   MRN: [id number]  HPI: Ryan Day is a 70 y.o. male  Chief Complaint  Patient presents with  . Cough    [name] states that the patient has been having mucus around his trach for the last 2 days   UPPER RESPIRATORY TRACT INFECTION Worst symptom: Has been coughing out mucus from is trach for the past couple of days.  Fever: no Cough: yes Shortness of breath: no Wheezing: no Chest pain: no Chest tightness: no Chest congestion:  Nasal congestion: no Runny nose: no Post nasal drip: yes Sneezing: no Sore throat: no Swollen glands: no Sinus pressure: no Headache: no Face pain: no Toothache: no Ear pain: no  Ear pressure: no  Eyes red/itching:no Eye drainage/crusting: no  Vomiting: no Rash: no Fatigue: no Sick contacts: no Strep contacts: no  Context: stable Recurrent sinusitis: no Relief with OTC cold/cough medications: no  Treatments attempted: none  Relevant past medical, surgical, family and social history reviewed and updated as indicated. Interim medical history since our last visit reviewed. Allergies and medications reviewed and updated.  Review of Systems  Constitutional: Negative.   HENT: Positive for congestion. Negative for dental problem, drooling, ear discharge, ear pain, facial swelling, hearing loss, mouth sores, nosebleeds, postnasal drip, rhinorrhea, sinus pressure, sinus pain, sneezing, sore throat, tinnitus, trouble swallowing and voice change.   Respiratory: Positive for cough and chest tightness. Negative for apnea, choking, shortness of breath, wheezing and stridor.   Cardiovascular: Negative.   Gastrointestinal: Negative.   Musculoskeletal: Negative.   Psychiatric/Behavioral: Negative.     Per HPI unless specifically indicated above     Objective:    BP (!) 149/84   Pulse 81    Temp (!) 97.4 F (36.3 C) (Oral)   SpO2 97%   Wt Readings from Last 3 Encounters:  08/15/20 249 lb 5.4 oz (113.1 kg)  08/08/20 245 lb 2.4 oz (111.2 kg)  07/23/20 238 lb 1.6 oz (108 kg)    Physical Exam Constitutional:      General: He is not in acute distress.    Appearance: Normal appearance. He is well-developed. He is not ill-appearing, toxic-appearing or diaphoretic.     Comments: Trach in place, in hospital bed  HENT:     Head: Normocephalic and atraumatic.     Right Ear: Hearing and external ear normal.     Left Ear: Hearing and external ear normal.     Nose: Nose normal.  Eyes:     General: Lids are normal. No scleral icterus.       Right eye: No discharge.        Left eye: No discharge.     Extraocular Movements: Extraocular movements intact.     Conjunctiva/sclera: Conjunctivae normal.     Pupils: Pupils are equal, round, and reactive to light.  Pulmonary:     Effort: Pulmonary effort is normal. No respiratory distress.  Musculoskeletal:        General: Normal range of motion.     Cervical back: Normal range of motion.  Skin:    Coloration: Skin is not jaundiced or pale.     Findings: No bruising, erythema, lesion or rash.  Neurological:     General: No focal deficit present.     Mental Status:  He is alert and oriented to person, place, and time.  Psychiatric:        Mood and Affect: Mood normal.        Speech: Speech normal.        Behavior: Behavior normal.        Thought Content: Thought content normal.        Judgment: Judgment normal.     Results for orders placed or performed during the hospital encounter of 08/14/20  Resp Panel by RT-PCR (Flu A&B, Covid) Nasopharyngeal Swab   Specimen: Nasopharyngeal Swab; Nasopharyngeal(NP) swabs in vial transport medium  Result Value Ref Range   SARS Coronavirus 2 by RT PCR NEGATIVE NEGATIVE   Influenza A by PCR NEGATIVE NEGATIVE   Influenza B by PCR NEGATIVE NEGATIVE  Culture, blood (routine x 2)   Specimen:  BLOOD  Result Value Ref Range   Specimen Description BLOOD LEFT ANTECUBITAL    Special Requests      BOTTLES DRAWN AEROBIC AND ANAEROBIC Blood Culture results may not be optimal due to an excessive volume of blood received in culture bottles   Culture      NO GROWTH 5 DAYS Performed at [facility], [address], [place], [place] [address]    Report Status 08/19/2020 FINAL   Culture, blood (routine x 2)   Specimen: BLOOD  Result Value Ref Range   Specimen Description BLOOD BLOOD RIGHT FOREARM    Special Requests      BOTTLES DRAWN AEROBIC AND ANAEROBIC Blood Culture adequate volume   Culture      NO GROWTH 5 DAYS Performed at [facility], [address] [place], [place] [address]    Report Status 08/19/2020 FINAL   Culture, respiratory   Specimen: SPU  Result Value Ref Range   Specimen Description      SPUTUM Performed at [facility], [address], [place], [place] [address]    Special Requests      NONE Performed at [facility], [address] [place], [place] [address]    Gram Stain      ABUNDANT WBC PRESENT,BOTH PMN AND MONONUCLEAR FEW GRAM POSITIVE RODS RARE GRAM POSITIVE COCCI IN PAIRS    Culture      RARE Normal respiratory flora-no Staph aureus or Pseudomonas seen Performed at [facility] [address], [place], [place] [address]    Report Status 08/17/2020 FINAL   Aerobic/Anaerobic Culture (surgical/deep wound)   Specimen: PATH Other; Tissue  Result Value Ref Range   Specimen Description TISSUE SACRAL    Special Requests BONE    Gram Stain      RARE WBC PRESENT,BOTH PMN AND MONONUCLEAR NO ORGANISMS SEEN    Culture      RARE ENTEROBACTER CLOACAE FEW STREPTOCOCCUS CONSTELLATUS FEW BACTEROIDES THETAIOTAOMICRON BETA LACTAMASE POSITIVE Performed at [facility], [place] [address] [place], [place] [address]    Report Status 08/19/2020 FINAL    Organism ID, Bacteria ENTEROBACTER CLOACAE        Susceptibility   Enterobacter cloacae - MIC*    CEFAZOLIN >=64 RESISTANT Resistant     CEFEPIME <=0.12 SENSITIVE Sensitive     CEFTAZIDIME <=1 SENSITIVE Sensitive     CIPROFLOXACIN <=0.25 SENSITIVE Sensitive     GENTAMICIN <=1 SENSITIVE Sensitive     IMIPENEM 0.5 SENSITIVE Sensitive     TRIMETH/SULFA <=20 SENSITIVE Sensitive     PIP/TAZO <=4 SENSITIVE Sensitive     * RARE ENTEROBACTER CLOACAE  Aerobic/Anaerobic Culture (surgical/deep wound)   Specimen: PATH Other; Tissue  Result Value Ref Range   Specimen Description      ABSCESS Performed at [facility], [address], [place], [place] [address]    Special Requests      SACRAL DECUBITUS ULCER Performed at [facility], [place], [place] [address]    Gram Stain NO WBC SEEN NO ORGANISMS SEEN NO ORGANISMS SEEN     Culture (A)     MULTIPLE ORGANISMS PRESENT, NONE PREDOMINANT FEW BACTEROIDES THETAIOTAOMICRON BETA LACTAMASE POSITIVE Performed at [facility], [place] [address], [place], [place] [address]    Report Status 08/19/2020 FINAL   Comprehensive metabolic panel  Result Value Ref Range   Sodium 135 135 - 145 mmol/L   Potassium 3.4 (L) 3.5 - 5.1 mmol/L   Chloride 102 98 - 111 mmol/L   CO2 28 22 - 32 mmol/L   Glucose, Bld 97 70 - 99 mg/dL   BUN 23 8 - 23 mg/dL   Creatinine, Ser 0.53 (L) 0.61 - 1.24 mg/dL   Calcium 8.6 (L) 8.9 - 10.3 mg/dL   Total Protein 7.4 6.5 - 8.1 g/dL   Albumin 2.6 (L) 3.5 - 5.0 g/dL   AST 21 15 - 41 U/L   ALT 31 0 - 44 U/L   Alkaline Phosphatase 130 (H) 38 - 126 U/L   Total Bilirubin 0.5 0.3 - 1.2 mg/dL   GFR, Estimated >60 >60 mL/min   Anion gap 5 5 - 15  CBC with Differential  Result Value Ref Range   WBC 11.0 (H) 4.0 - 10.5 K/uL   RBC 3.05 (L) 4.22 - 5.81 MIL/uL   Hemoglobin 8.7 (L) 13.0 - 17.0 g/dL   HCT 27.1 (L) 39.0 - 52.0 %   MCV 88.9 80.0 - 100.0 fL   MCH 28.5 26.0 - 34.0 pg   MCHC 32.1 30.0 - 36.0 g/dL   RDW 19.1 (H) 11.5 - 15.5 %    Platelets 318 150 - 400 K/uL   nRBC 0.0 0.0 - 0.2 %   Neutrophils Relative % 73 %   Neutro Abs 8.1 (H) 1.7 - 7.7 K/uL   Lymphocytes Relative 8 %   Lymphs Abs 0.9 0.7 - 4.0 K/uL   Monocytes Relative 17 %   Monocytes Absolute 1.8 (H) 0.1 - 1.0 K/uL   Eosinophils Relative 1 %   Eosinophils Absolute 0.1 0.0 - 0.5 K/uL   Basophils Relative 0 %   Basophils Absolute 0.0 0.0 - 0.1 K/uL   Immature Granulocytes 1 %   Abs Immature Granulocytes 0.09 (H) 0.00 - 0.07 K/uL  Brain natriuretic peptide  Result Value Ref Range   B Natriuretic Peptide 59.5 0.0 - 100.0 pg/mL  Urinalysis, Complete w Microscopic Urine, Clean Catch  Result Value Ref Range   Color, Urine YELLOW (A) YELLOW   APPearance CLOUDY (A) CLEAR   Specific Gravity, Urine 1.011 1.005 - 1.030   pH 8.0 5.0 - 8.0   Glucose, UA NEGATIVE NEGATIVE mg/dL   Hgb urine dipstick SMALL (A) NEGATIVE   Bilirubin Urine NEGATIVE NEGATIVE   Ketones, ur NEGATIVE NEGATIVE mg/dL   Protein, ur NEGATIVE NEGATIVE mg/dL   Nitrite NEGATIVE NEGATIVE   Leukocytes,Ua LARGE (A) NEGATIVE   RBC / HPF 0-5 0 - 5 RBC/hpf   WBC, UA >50 (H) 0 - 5 WBC/hpf   Bacteria, UA RARE (A) NONE SEEN   Squamous Epithelial / LPF 0-5 0 - 5  WBC Clumps PRESENT    Mucus PRESENT   Lactic acid, plasma  Result Value Ref Range   Lactic Acid, Venous 1.2 0.5 - 1.9 mmol/L  Lactic acid, plasma  Result Value Ref Range   Lactic Acid, Venous 1.5 0.5 - 1.9 mmol/L  Comprehensive metabolic panel  Result Value Ref Range   Sodium 139 135 - 145 mmol/L   Potassium 3.9 3.5 - 5.1 mmol/L   Chloride 101 98 - 111 mmol/L   CO2 29 22 - 32 mmol/L   Glucose, Bld 101 (H) 70 - 99 mg/dL   BUN 18 8 - 23 mg/dL   Creatinine, Ser 1.610.60 (L) 0.61 - 1.24 mg/dL   Calcium 9.4 8.9 - 09.610.3 mg/dL   Total Protein 7.0 6.5 - 8.1 g/dL   Albumin 2.3 (L) 3.5 - 5.0 g/dL   AST 20 15 - 41 U/L   ALT 28 0 - 44 U/L   Alkaline Phosphatase 112 38 - 126 U/L   Total Bilirubin 0.7 0.3 - 1.2 mg/dL   GFR, Estimated >04>60 >54>60  mL/min   Anion gap 9 5 - 15  CBC  Result Value Ref Range   WBC 10.5 4.0 - 10.5 K/uL   RBC 2.88 (L) 4.22 - 5.81 MIL/uL   Hemoglobin 8.1 (L) 13.0 - 17.0 g/dL   HCT 09.825.4 (L) 11.939.0 - 14.752.0 %   MCV 88.2 80.0 - 100.0 fL   MCH 28.1 26.0 - 34.0 pg   MCHC 31.9 30.0 - 36.0 g/dL   RDW 82.918.5 (H) 56.211.5 - 13.015.5 %   Platelets 307 150 - 400 K/uL   nRBC 0.0 0.0 - 0.2 %  Protime-INR  Result Value Ref Range   Prothrombin Time 14.4 11.4 - 15.2 seconds   INR 1.2 0.8 - 1.2  APTT  Result Value Ref Range   aPTT 48 (H) 24 - 36 seconds  Magnesium  Result Value Ref Range   Magnesium 2.1 1.7 - 2.4 mg/dL  Phosphorus  Result Value Ref Range   Phosphorus 3.5 2.5 - 4.6 mg/dL  Legionella Pneumophila Serogp 1 Ur Ag  Result Value Ref Range   L. pneumophila Serogp 1 Ur Ag Negative Negative   Source of Sample URINE, RANDOM   Strep pneumoniae urinary antigen  Result Value Ref Range   Strep Pneumo Urinary Antigen NEGATIVE NEGATIVE  Procalcitonin - Baseline  Result Value Ref Range   Procalcitonin <0.10 ng/mL  Basic metabolic panel  Result Value Ref Range   Sodium 140 135 - 145 mmol/L   Potassium 3.9 3.5 - 5.1 mmol/L   Chloride 102 98 - 111 mmol/L   CO2 29 22 - 32 mmol/L   Glucose, Bld 137 (H) 70 - 99 mg/dL   BUN 18 8 - 23 mg/dL   Creatinine, Ser 8.650.79 0.61 - 1.24 mg/dL   Calcium 9.4 8.9 - 78.410.3 mg/dL   GFR, Estimated >69>60 >62>60 mL/min   Anion gap 9 5 - 15  CBC  Result Value Ref Range   WBC 18.8 (H) 4.0 - 10.5 K/uL   RBC 3.00 (L) 4.22 - 5.81 MIL/uL   Hemoglobin 8.5 (L) 13.0 - 17.0 g/dL   HCT 95.226.3 (L) 84.139.0 - 32.452.0 %   MCV 87.7 80.0 - 100.0 fL   MCH 28.3 26.0 - 34.0 pg   MCHC 32.3 30.0 - 36.0 g/dL   RDW 40.118.8 (H) 02.711.5 - 25.315.5 %   Platelets 292 150 - 400 K/uL   nRBC 0.0 0.0 - 0.2 %  Magnesium  Result Value Ref Range   Magnesium 2.1 1.7 - 2.4 mg/dL  CBC  Result Value Ref Range   WBC 13.8 (H) 4.0 - 10.5 K/uL   RBC 2.86 (L) 4.22 - 5.81 MIL/uL   Hemoglobin 8.0 (L) 13.0 - 17.0 g/dL   HCT 16.125.4 (L) 09.639.0 - 04.552.0  %   MCV 88.8 80.0 - 100.0 fL   MCH 28.0 26.0 - 34.0 pg   MCHC 31.5 30.0 - 36.0 g/dL   RDW 40.918.1 (H) 81.111.5 - 91.415.5 %   Platelets 293 150 - 400 K/uL   nRBC 0.0 0.0 - 0.2 %  Basic metabolic panel  Result Value Ref Range   Sodium 141 135 - 145 mmol/L   Potassium 3.8 3.5 - 5.1 mmol/L   Chloride 104 98 - 111 mmol/L   CO2 28 22 - 32 mmol/L   Glucose, Bld 198 (H) 70 - 99 mg/dL   BUN 20 8 - 23 mg/dL   Creatinine, Ser 7.820.70 0.61 - 1.24 mg/dL   Calcium 9.2 8.9 - 95.610.3 mg/dL   GFR, Estimated >21>60 >30>60 mL/min   Anion gap 9 5 - 15  Magnesium  Result Value Ref Range   Magnesium 2.2 1.7 - 2.4 mg/dL  Basic metabolic panel  Result Value Ref Range   Sodium 144 135 - 145 mmol/L   Potassium 3.5 3.5 - 5.1 mmol/L   Chloride 104 98 - 111 mmol/L   CO2 32 22 - 32 mmol/L   Glucose, Bld 134 (H) 70 - 99 mg/dL   BUN 24 (H) 8 - 23 mg/dL   Creatinine, Ser 8.650.55 (L) 0.61 - 1.24 mg/dL   Calcium 8.9 8.9 - 78.410.3 mg/dL   GFR, Estimated >69>60 >62>60 mL/min   Anion gap 8 5 - 15  CBC  Result Value Ref Range   WBC 10.0 4.0 - 10.5 K/uL   RBC 2.93 (L) 4.22 - 5.81 MIL/uL   Hemoglobin 8.4 (L) 13.0 - 17.0 g/dL   HCT 95.225.7 (L) 84.139.0 - 32.452.0 %   MCV 87.7 80.0 - 100.0 fL   MCH 28.7 26.0 - 34.0 pg   MCHC 32.7 30.0 - 36.0 g/dL   RDW 40.118.3 (H) 02.711.5 - 25.315.5 %   Platelets 294 150 - 400 K/uL   nRBC 0.0 0.0 - 0.2 %  Magnesium  Result Value Ref Range   Magnesium 2.1 1.7 - 2.4 mg/dL  Vancomycin, trough  Result Value Ref Range   Vancomycin Tr 15 15 - 20 ug/mL  Comprehensive metabolic panel  Result Value Ref Range   Sodium 145 135 - 145 mmol/L   Potassium 3.7 3.5 - 5.1 mmol/L   Chloride 106 98 - 111 mmol/L   CO2 31 22 - 32 mmol/L   Glucose, Bld 116 (H) 70 - 99 mg/dL   BUN 25 (H) 8 - 23 mg/dL   Creatinine, Ser 6.640.62 0.61 - 1.24 mg/dL   Calcium 9.4 8.9 - 40.310.3 mg/dL   Total Protein 7.5 6.5 - 8.1 g/dL   Albumin 2.5 (L) 3.5 - 5.0 g/dL   AST 28 15 - 41 U/L   ALT 49 (H) 0 - 44 U/L   Alkaline Phosphatase 125 38 - 126 U/L   Total  Bilirubin 0.5 0.3 - 1.2 mg/dL   GFR, Estimated >47>60 >42>60 mL/min   Anion gap 8 5 - 15  CBC with Differential/Platelet  Result Value Ref Range   WBC 8.2 4.0 - 10.5 K/uL   RBC 3.15 (L) 4.22 - 5.81 MIL/uL  Hemoglobin 8.8 (L) 13.0 - 17.0 g/dL   HCT 28.8 (L) 39.0 - 52.0 %   MCV 91.4 80.0 - 100.0 fL   MCH 27.9 26.0 - 34.0 pg   MCHC 30.6 30.0 - 36.0 g/dL   RDW 18.1 (H) 11.5 - 15.5 %   Platelets 299 150 - 400 K/uL   nRBC 0.0 0.0 - 0.2 %   Neutrophils Relative % 65 %   Neutro Abs 5.4 1.7 - 7.7 K/uL   Lymphocytes Relative 17 %   Lymphs Abs 1.4 0.7 - 4.0 K/uL   Monocytes Relative 13 %   Monocytes Absolute 1.1 (H) 0.1 - 1.0 K/uL   Eosinophils Relative 4 %   Eosinophils Absolute 0.3 0.0 - 0.5 K/uL   Basophils Relative 0 %   Basophils Absolute 0.0 0.0 - 0.1 K/uL   Immature Granulocytes 1 %   Abs Immature Granulocytes 0.04 0.00 - 0.07 K/uL  Comprehensive metabolic panel  Result Value Ref Range   Sodium 149 (H) 135 - 145 mmol/L   Potassium 3.4 (L) 3.5 - 5.1 mmol/L   Chloride 108 98 - 111 mmol/L   CO2 29 22 - 32 mmol/L   Glucose, Bld 151 (H) 70 - 99 mg/dL   BUN 29 (H) 8 - 23 mg/dL   Creatinine, Ser 0.66 0.61 - 1.24 mg/dL   Calcium 9.8 8.9 - 10.3 mg/dL   Total Protein 7.9 6.5 - 8.1 g/dL   Albumin 2.5 (L) 3.5 - 5.0 g/dL   AST 20 15 - 41 U/L   ALT 38 0 - 44 U/L   Alkaline Phosphatase 121 38 - 126 U/L   Total Bilirubin 0.7 0.3 - 1.2 mg/dL   GFR, Estimated >60 >60 mL/min   Anion gap 12 5 - 15  CBC with Differential/Platelet  Result Value Ref Range   WBC 11.7 (H) 4.0 - 10.5 K/uL   RBC 3.14 (L) 4.22 - 5.81 MIL/uL   Hemoglobin 8.7 (L) 13.0 - 17.0 g/dL   HCT 28.3 (L) 39.0 - 52.0 %   MCV 90.1 80.0 - 100.0 fL   MCH 27.7 26.0 - 34.0 pg   MCHC 30.7 30.0 - 36.0 g/dL   RDW 18.4 (H) 11.5 - 15.5 %   Platelets 288 150 - 400 K/uL   nRBC 0.0 0.0 - 0.2 %   Neutrophils Relative % 79 %   Neutro Abs 9.2 (H) 1.7 - 7.7 K/uL   Lymphocytes Relative 10 %   Lymphs Abs 1.2 0.7 - 4.0 K/uL   Monocytes  Relative 9 %   Monocytes Absolute 1.1 (H) 0.1 - 1.0 K/uL   Eosinophils Relative 2 %   Eosinophils Absolute 0.2 0.0 - 0.5 K/uL   Basophils Relative 0 %   Basophils Absolute 0.0 0.0 - 0.1 K/uL   Immature Granulocytes 0 %   Abs Immature Granulocytes 0.05 0.00 - 0.07 K/uL  CBC with Differential/Platelet  Result Value Ref Range   WBC 11.8 (H) 4.0 - 10.5 K/uL   RBC 3.08 (L) 4.22 - 5.81 MIL/uL   Hemoglobin 8.7 (L) 13.0 - 17.0 g/dL   HCT 28.0 (L) 39.0 - 52.0 %   MCV 90.9 80.0 - 100.0 fL   MCH 28.2 26.0 - 34.0 pg   MCHC 31.1 30.0 - 36.0 g/dL   RDW 18.6 (H) 11.5 - 15.5 %   Platelets 248 150 - 400 K/uL   nRBC 0.0 0.0 - 0.2 %   Neutrophils Relative % 79 %   Neutro Abs 9.3 (H)  1.7 - 7.7 K/uL   Lymphocytes Relative 11 %   Lymphs Abs 1.3 0.7 - 4.0 K/uL   Monocytes Relative 8 %   Monocytes Absolute 0.9 0.1 - 1.0 K/uL   Eosinophils Relative 2 %   Eosinophils Absolute 0.3 0.0 - 0.5 K/uL   Basophils Relative 0 %   Basophils Absolute 0.0 0.0 - 0.1 K/uL   Immature Granulocytes 0 %   Abs Immature Granulocytes 0.05 0.00 - 0.07 K/uL  Basic metabolic panel  Result Value Ref Range   Sodium 150 (H) 135 - 145 mmol/L   Potassium 3.2 (L) 3.5 - 5.1 mmol/L   Chloride 111 98 - 111 mmol/L   CO2 30 22 - 32 mmol/L   Glucose, Bld 110 (H) 70 - 99 mg/dL   BUN 27 (H) 8 - 23 mg/dL   Creatinine, Ser 0.63 0.61 - 1.24 mg/dL   Calcium 9.6 8.9 - 10.3 mg/dL   GFR, Estimated >60 >60 mL/min   Anion gap 9 5 - 15  CBC with Differential/Platelet  Result Value Ref Range   WBC 9.0 4.0 - 10.5 K/uL   RBC 2.74 (L) 4.22 - 5.81 MIL/uL   Hemoglobin 7.9 (L) 13.0 - 17.0 g/dL   HCT 25.4 (L) 39.0 - 52.0 %   MCV 92.7 80.0 - 100.0 fL   MCH 28.8 26.0 - 34.0 pg   MCHC 31.1 30.0 - 36.0 g/dL   RDW 18.8 (H) 11.5 - 15.5 %   Platelets 241 150 - 400 K/uL   nRBC 0.0 0.0 - 0.2 %   Neutrophils Relative % 78 %   Neutro Abs 7.0 1.7 - 7.7 K/uL   Lymphocytes Relative 10 %   Lymphs Abs 0.9 0.7 - 4.0 K/uL   Monocytes Relative 8 %    Monocytes Absolute 0.7 0.1 - 1.0 K/uL   Eosinophils Relative 4 %   Eosinophils Absolute 0.3 0.0 - 0.5 K/uL   Basophils Relative 0 %   Basophils Absolute 0.0 0.0 - 0.1 K/uL   Immature Granulocytes 0 %   Abs Immature Granulocytes 0.04 0.00 - 0.07 K/uL  Basic metabolic panel  Result Value Ref Range   Sodium 154 (H) 135 - 145 mmol/L   Potassium 3.1 (L) 3.5 - 5.1 mmol/L   Chloride 115 (H) 98 - 111 mmol/L   CO2 30 22 - 32 mmol/L   Glucose, Bld 174 (H) 70 - 99 mg/dL   BUN 26 (H) 8 - 23 mg/dL   Creatinine, Ser 0.59 (L) 0.61 - 1.24 mg/dL   Calcium 9.6 8.9 - 10.3 mg/dL   GFR, Estimated >60 >60 mL/min   Anion gap 9 5 - 15  Sodium  Result Value Ref Range   Sodium 154 (H) 135 - 145 mmol/L  Basic metabolic panel  Result Value Ref Range   Sodium 154 (H) 135 - 145 mmol/L   Potassium 3.8 3.5 - 5.1 mmol/L   Chloride 119 (H) 98 - 111 mmol/L   CO2 27 22 - 32 mmol/L   Glucose, Bld 164 (H) 70 - 99 mg/dL   BUN 24 (H) 8 - 23 mg/dL   Creatinine, Ser 0.50 (L) 0.61 - 1.24 mg/dL   Calcium 9.5 8.9 - 10.3 mg/dL   GFR, Estimated >60 >60 mL/min   Anion gap 8 5 - 15  Magnesium  Result Value Ref Range   Magnesium 2.3 1.7 - 2.4 mg/dL  CBC with Differential/Platelet  Result Value Ref Range   WBC 7.2 4.0 - 10.5 K/uL  RBC 2.75 (L) 4.22 - 5.81 MIL/uL   Hemoglobin 7.8 (L) 13.0 - 17.0 g/dL   HCT 25.4 (L) 39.0 - 52.0 %   MCV 92.4 80.0 - 100.0 fL   MCH 28.4 26.0 - 34.0 pg   MCHC 30.7 30.0 - 36.0 g/dL   RDW 18.4 (H) 11.5 - 15.5 %   Platelets 239 150 - 400 K/uL   nRBC 0.0 0.0 - 0.2 %   Neutrophils Relative % 71 %   Neutro Abs 5.1 1.7 - 7.7 K/uL   Lymphocytes Relative 15 %   Lymphs Abs 1.1 0.7 - 4.0 K/uL   Monocytes Relative 9 %   Monocytes Absolute 0.6 0.1 - 1.0 K/uL   Eosinophils Relative 5 %   Eosinophils Absolute 0.3 0.0 - 0.5 K/uL   Basophils Relative 0 %   Basophils Absolute 0.0 0.0 - 0.1 K/uL   Immature Granulocytes 0 %   Abs Immature Granulocytes 0.03 0.00 - 0.07 K/uL  Basic metabolic panel   Result Value Ref Range   Sodium 154 (H) 135 - 145 mmol/L   Potassium 3.6 3.5 - 5.1 mmol/L   Chloride 117 (H) 98 - 111 mmol/L   CO2 28 22 - 32 mmol/L   Glucose, Bld 111 (H) 70 - 99 mg/dL   BUN 19 8 - 23 mg/dL   Creatinine, Ser 0.59 (L) 0.61 - 1.24 mg/dL   Calcium 9.9 8.9 - 10.3 mg/dL   GFR, Estimated >60 >60 mL/min   Anion gap 9 5 - 15  Basic metabolic panel  Result Value Ref Range   Sodium 152 (H) 135 - 145 mmol/L   Potassium 3.5 3.5 - 5.1 mmol/L   Chloride 116 (H) 98 - 111 mmol/L   CO2 26 22 - 32 mmol/L   Glucose, Bld 119 (H) 70 - 99 mg/dL   BUN 18 8 - 23 mg/dL   Creatinine, Ser 0.54 (L) 0.61 - 1.24 mg/dL   Calcium 9.6 8.9 - 10.3 mg/dL   GFR, Estimated >60 >60 mL/min   Anion gap 10 5 - 15  Basic metabolic panel  Result Value Ref Range   Sodium 150 (H) 135 - 145 mmol/L   Potassium 3.2 (L) 3.5 - 5.1 mmol/L   Chloride 115 (H) 98 - 111 mmol/L   CO2 27 22 - 32 mmol/L   Glucose, Bld 186 (H) 70 - 99 mg/dL   BUN 18 8 - 23 mg/dL   Creatinine, Ser 0.62 0.61 - 1.24 mg/dL   Calcium 9.3 8.9 - 10.3 mg/dL   GFR, Estimated >60 >60 mL/min   Anion gap 8 5 - 15  CBC with Differential/Platelet  Result Value Ref Range   WBC 6.6 4.0 - 10.5 K/uL   RBC 2.72 (L) 4.22 - 5.81 MIL/uL   Hemoglobin 7.8 (L) 13.0 - 17.0 g/dL   HCT 24.8 (L) 39.0 - 52.0 %   MCV 91.2 80.0 - 100.0 fL   MCH 28.7 26.0 - 34.0 pg   MCHC 31.5 30.0 - 36.0 g/dL   RDW 18.6 (H) 11.5 - 15.5 %   Platelets 234 150 - 400 K/uL   nRBC 0.0 0.0 - 0.2 %   Neutrophils Relative % 69 %   Neutro Abs 4.6 1.7 - 7.7 K/uL   Lymphocytes Relative 16 %   Lymphs Abs 1.1 0.7 - 4.0 K/uL   Monocytes Relative 9 %   Monocytes Absolute 0.6 0.1 - 1.0 K/uL   Eosinophils Relative 5 %   Eosinophils Absolute 0.3 0.0 -  0.5 K/uL   Basophils Relative 0 %   Basophils Absolute 0.0 0.0 - 0.1 K/uL   Immature Granulocytes 1 %   Abs Immature Granulocytes 0.03 0.00 - 0.07 K/uL  Magnesium  Result Value Ref Range   Magnesium 2.1 1.7 - 2.4 mg/dL  Phosphorus   Result Value Ref Range   Phosphorus 2.5 2.5 - 4.6 mg/dL  Comprehensive metabolic panel  Result Value Ref Range   Sodium 147 (H) 135 - 145 mmol/L   Potassium 3.2 (L) 3.5 - 5.1 mmol/L   Chloride 112 (H) 98 - 111 mmol/L   CO2 27 22 - 32 mmol/L   Glucose, Bld 106 (H) 70 - 99 mg/dL   BUN 16 8 - 23 mg/dL   Creatinine, Ser 0.50 (L) 0.61 - 1.24 mg/dL   Calcium 9.4 8.9 - 10.3 mg/dL   Total Protein 6.7 6.5 - 8.1 g/dL   Albumin 2.2 (L) 3.5 - 5.0 g/dL   AST 22 15 - 41 U/L   ALT 30 0 - 44 U/L   Alkaline Phosphatase 96 38 - 126 U/L   Total Bilirubin 0.6 0.3 - 1.2 mg/dL   GFR, Estimated >60 >60 mL/min   Anion gap 8 5 - 15  Glucose, capillary  Result Value Ref Range   Glucose-Capillary 162 (H) 70 - 99 mg/dL   Comment 1 Notify RN   Basic metabolic panel  Result Value Ref Range   Sodium 146 (H) 135 - 145 mmol/L   Potassium 3.7 3.5 - 5.1 mmol/L   Chloride 110 98 - 111 mmol/L   CO2 27 22 - 32 mmol/L   Glucose, Bld 94 70 - 99 mg/dL   BUN 18 8 - 23 mg/dL   Creatinine, Ser 0.63 0.61 - 1.24 mg/dL   Calcium 9.4 8.9 - 10.3 mg/dL   GFR, Estimated >60 >60 mL/min   Anion gap 9 5 - 15  Magnesium  Result Value Ref Range   Magnesium 2.2 1.7 - 2.4 mg/dL  Surgical pathology  Result Value Ref Range   SURGICAL PATHOLOGY      SURGICAL PATHOLOGY CASE: [id number] PATIENT: Ryan Day Surgical Pathology Report     Specimen Submitted: A. Sacral Bone  Clinical History: Infected sacral decubitus ulcer      DIAGNOSIS: A. SACRAL BONE; BIOPSY: - ACUTE OSTEOMYELITIS. - NEGATIVE FOR MALIGNANCY.   GROSS DESCRIPTION: A. Labeled: Sacral bone Received: In formalin Tissue fragment(s): 2 Size: 1.7 x 0.9 x 0.3 cm Description: Aggregate of pink-red bone fragments Entirely submitted in 1 cassette after decalcification.   Final Diagnosis performed by Betsy Pries, MD.   Electronically signed 08/18/2020 9:04:42AM The electronic signature indicates that the named Attending Pathologist has  evaluated the specimen Technical component performed at [facility], [address], [place], [place] [address] Lab: [phone] Dir: Rush Farmer, MD, MMM  Professional component performed at [facility], [facility], [place], [place], [place] [address] Lab: [phone] Dir: Dellia Nims. Rubinas, MD   Troponin I (High Sensitivity)  Result Value Ref Range   Troponin I (High Sensitivity) 9 <18 ng/L  Troponin I (High Sensitivity)  Result Value Ref Range   Troponin I (High Sensitivity) 9 <18 ng/L      Assessment & Plan:   Problem List Items Addressed This Visit   None   Visit Diagnoses    Bronchitis    -  Primary   Will treat with azithromycin. Referral to pulm for trach care placed today. Call with any concerns. Continue to monitor.  Follow up plan: Return if symptoms worsen or fail to improve.   . This visit was completed via MyChart due to the restrictions of the COVID-19 pandemic. All issues as above were discussed and addressed. Physical exam was done as above through visual confirmation on MyChart. If it was felt that the patient should be evaluated in the office, they were directed there. The patient verbally consented to this visit. . Location of the patient: home . Location of the provider: home . Those involved with this call:  . Provider: Park Liter, DO . CMA: Yvonna Alanis, [place] . Front Desk/Registration: Jill Side  . Time spent on call: 15 minutes with patient face to face via video conference. More than 50% of this time was spent in counseling and coordination of care. 23 minutes total spent in review of patient's record and preparation of their chart.

## 2020-10-07 NOTE — Progress Notes (Signed)
Ryan Day ([id number]) Visit Report for 10/04/2020 Abuse/Suicide Risk Screen Details Patient Name: Ryan Day, Ryan Day Date of Service: 10/04/2020 12:45 PM Medical Record Number: [id number] Patient Account Number: [id number] Date of Birth/Sex: 1951/05/22 (70 y.o. M) Treating RN: Cornell Barman Primary Care Youlanda Tomassetti: Park Liter Other Clinician: Referring Korby Ratay: Tsosie Billing Treating Ashely Joshua/Extender: Tito Dine in Treatment: 0 Abuse/Suicide Risk Screen Items Answer ABUSE RISK SCREEN: Has anyone close to you tried to hurt or harm you recentlyo No Do you feel uncomfortable with anyone in your familyo No Has anyone forced you do things that you didnot want to doo No Electronic Signature(s) Signed: 10/07/2020 10:48:52 AM By: Gretta Cool, BSN, RN, CWS, Kim RN, BSN Entered By: Gretta Cool, BSN, RN, CWS, Kim on 10/04/2020 13:25:13 Ryan Day ([id number]) -------------------------------------------------------------------------------- Activities of Daily Living Details Patient Name: Ryan Day Date of Service: 10/04/2020 12:45 PM Medical Record Number: [id number] Patient Account Number: [id number] Date of Birth/Sex: 1951-05-25 (69 y.o. M) Treating RN: Cornell Barman Primary Care Randee Huston: Park Liter Other Clinician: Referring Jasiri Hanawalt: Tsosie Billing Treating Eilleen Davoli/Extender: Tito Dine in Treatment: 0 Activities of Daily Living Items Answer Activities of Daily Living (Please select one for each item) Drive Automobile Not Able Take Medications Need Assistance Use Telephone Need Assistance Care for Appearance Need Assistance Use Toilet Need Assistance Bath / Shower Need Assistance Dress Self Need Assistance Feed Self Need Assistance Walk Need Assistance Get In / Out Bed Need Assistance Housework Need Assistance Prepare Meals Need Assistance Handle Money Need Assistance Shop for Self Need Assistance Electronic Signature(s) Signed: 10/07/2020 10:48:52  AM By: Gretta Cool, BSN, RN, CWS, Kim RN, BSN Entered By: Gretta Cool, BSN, RN, CWS, Kim on 10/04/2020 13:25:37 Ryan Day ([id number]) -------------------------------------------------------------------------------- Education Screening Details Patient Name: Ryan Day Date of Service: 10/04/2020 12:45 PM Medical Record Number: [id number] Patient Account Number: [id number] Date of Birth/Sex: 04-09-1951 (69 y.o. M) Treating RN: Cornell Barman Primary Care Romelle Muldoon: Park Liter Other Clinician: Referring Dartagnan Beavers: Tsosie Billing Treating Diavian Furgason/Extender: Tito Dine in Treatment: 0 Primary Learner Assessed: Caregiver daughters Reason Patient is not Primary Learner: Quad needs daily assistance Learning Preferences/Education Level/Primary Language Learning Preference: Explanation, Demonstration Highest Education Level: High School Preferred Language: English Cognitive Barrier Language Barrier: No Translator Needed: No Memory Deficit: No Emotional Barrier: No Physical Barrier Impaired Vision: No Impaired Hearing: No Decreased Hand dexterity: No Knowledge/Comprehension Knowledge Level: High Comprehension Level: High Ability to understand written instructions: High Ability to understand verbal instructions: High Motivation Anxiety Level: Calm Cooperation: Cooperative Education Importance: Acknowledges Need Interest in Health Problems: Asks Questions Perception: Coherent Willingness to Engage in Self-Management High Activities: Readiness to Engage in Self-Management High Activities: Engineer, maintenance) Signed: 10/07/2020 10:48:52 AM By: Gretta Cool, BSN, RN, CWS, Kim RN, BSN Entered By: Gretta Cool, BSN, RN, CWS, Kim on 10/04/2020 13:26:38 Ryan Day ([id number]) -------------------------------------------------------------------------------- Fall Risk Assessment Details Patient Name: Ryan Day Date of Service: 10/04/2020 12:45 PM Medical Record Number:  [id number] Patient Account Number: [id number] Date of Birth/Sex: Oct 13, 1950 (69 y.o. M) Treating RN: Cornell Barman Primary Care Arely Tinner: Park Liter Other Clinician: Referring Carmisha Larusso: Tsosie Billing Treating Maddock Finigan/Extender: Tito Dine in Treatment: 0 Fall Risk Assessment Items Have you had 2 or more falls in the last 12 monthso 0 No Have you had any fall that resulted in injury in the last 12 monthso 0 No FALLS RISK SCREEN History of falling - immediate or within 3 months 25 Yes Secondary diagnosis (Do you have 2 or more medical diagnoseso) 0 No Ambulatory aid None/bed rest/wheelchair/nurse 0 Yes Crutches/cane/walker  0 No Furniture 0 No Intravenous therapy Access/Saline/Heparin Lock 0 No Gait/Transferring Normal/ bed rest/ wheelchair 0 Yes Weak (short steps with or without shuffle, stooped but able to lift head while walking, may 0 No seek support from furniture) Impaired (short steps with shuffle, may have difficulty arising from chair, head down, impaired 0 No balance) Mental Status Oriented to own ability 0 Yes Electronic Signature(s) Signed: 10/07/2020 10:48:52 AM By: Gretta Cool, BSN, RN, CWS, Kim RN, BSN Entered By: Gretta Cool, BSN, RN, CWS, Kim on 10/04/2020 13:27:00 Ryan Day ([id number]) -------------------------------------------------------------------------------- Foot Assessment Details Patient Name: Ryan Day Date of Service: 10/04/2020 12:45 PM Medical Record Number: [id number] Patient Account Number: [id number] Date of Birth/Sex: 08/03/1951 (69 y.o. M) Treating RN: Cornell Barman Primary Care Tacia Hindley: Park Liter Other Clinician: Referring Jamarl Pew: Tsosie Billing Treating Dustin Bumbaugh/Extender: Tito Dine in Treatment: 0 Foot Assessment Items Site Locations + = Sensation present, - = Sensation absent, C = Callus, U = Ulcer R = Redness, W = Warmth, M = Maceration, PU = Pre-ulcerative lesion F = Fissure, S = Swelling, D =  Dryness Assessment Right: Left: Other Deformity: No No Prior Foot Ulcer: No No Prior Amputation: No No Charcot Joint: No No Ambulatory Status: Non-ambulatory Assistance Device: Wheelchair Gait: Notes Government social research officer) Signed: 10/07/2020 10:48:52 AM By: Gretta Cool, BSN, RN, CWS, Kim RN, BSN Entered By: Gretta Cool, BSN, RN, CWS, Kim on 10/04/2020 13:27:55 Soler, Gerhardt ([id number]) -------------------------------------------------------------------------------- Nutrition Risk Screening Details Patient Name: Ryan Day Date of Service: 10/04/2020 12:45 PM Medical Record Number: [id number] Patient Account Number: [id number] Date of Birth/Sex: 05-02-1951 (69 y.o. M) Treating RN: Cornell Barman Primary Care Arihaan Bellucci: Park Liter Other Clinician: Referring Jaylissa Felty: Tsosie Billing Treating Breahna Boylen/Extender: Tito Dine in Treatment: 0 Height (in): 77 Weight (lbs): 240 Body Mass Index (BMI): 28.5 Nutrition Risk Screening Items Score Screening NUTRITION RISK SCREEN: I have an illness or condition that made me change the kind and/or amount of food I eat 0 No I eat fewer than two meals per day 0 No I eat few fruits and vegetables, or milk products 0 No I have three or more drinks of beer, liquor or wine almost every day 0 No I have tooth or mouth problems that make it hard for me to eat 0 No I don't always have enough money to buy the food I need 0 No I eat alone most of the time 0 No I take three or more different prescribed or over-the-counter drugs a day 0 No Without wanting to, I have lost or gained 10 pounds in the last six months 0 No I am not always physically able to shop, cook and/or feed myself 0 No Nutrition Protocols Good Risk Protocol 0 No interventions needed Moderate Risk Protocol High Risk Proctocol Risk Level: Good Risk Score: 0 Notes Feeding tube Electronic Signature(s) Signed: 10/07/2020 10:48:52 AM By: Gretta Cool, BSN, RN, CWS, Kim RN,  BSN Entered By: Gretta Cool, BSN, RN, CWS, Kim on 10/04/2020 13:27:21

## 2020-10-07 NOTE — Progress Notes (Signed)
WILLIAMSON, CAVANAH ([id number]) Visit Report for 10/04/2020 Chief Complaint Document Details Patient Name: Ryan Day, Ryan Day Date of Service: 10/04/2020 12:45 PM Medical Record Number: [id number] Patient Account Number: [id number] Date of Birth/Sex: 1951-08-15 (70 y.o. M) Treating RN: Cornell Barman Primary Care Provider: Park Liter Other Clinician: Referring Provider: Tsosie Billing Treating Provider/Extender: Tito Dine in Treatment: 0 Information Obtained from: Patient Chief Complaint 10/04/2020; patient is here for review of wounds on the sacrum, buttock and bilateral heels Electronic Signature(s) Signed: 10/05/2020 12:50:01 PM By: Linton Ham MD Entered By: Linton Ham on 10/04/2020 14:35:45 Day, Ryan ([id number]) -------------------------------------------------------------------------------- Debridement Details Patient Name: Ryan Day Date of Service: 10/04/2020 12:45 PM Medical Record Number: [id number] Patient Account Number: [id number] Date of Birth/Sex: Oct 13, 1950 (70 y.o. M) Treating RN: Cornell Barman Primary Care Provider: Park Liter Other Clinician: Referring Provider: Tsosie Billing Treating Provider/Extender: Tito Dine in Treatment: 0 Debridement Performed for Wound #4 Left Calcaneus Assessment: Performed By: Physician Ricard Dillon, MD Debridement Type: Debridement Level of Consciousness (Pre- Awake and Alert procedure): Pre-procedure Verification/Time Out Yes - 14:00 Taken: Start Time: 14:00 Total Area Debrided (L x W): 4 (cm) x 5 (cm) = 20 (cm) Tissue and other material Non-Viable, Eschar debrided: Level: Non-Viable Tissue Debridement Description: Selective/Open Wound Instrument: Blade, Forceps, Scissors Bleeding: Minimum Hemostasis Achieved: Pressure Response to Treatment: Procedure was tolerated well Level of Consciousness (Post- Awake and Alert procedure): Post Debridement Measurements of Total  Wound Length: (cm) 4 Stage: Category/Stage II Width: (cm) 5 Depth: (cm) 0.2 Volume: (cm) 3.142 Character of Wound/Ulcer Post Debridement: Stable Post Procedure Diagnosis Same as Pre-procedure Electronic Signature(s) Signed: 10/04/2020 3:18:46 PM By: Gretta Cool, BSN, RN, CWS, Kim RN, BSN Signed: 10/05/2020 12:50:01 PM By: Linton Ham MD Entered By: Gretta Cool, BSN, RN, CWS, Kim on 10/04/2020 15:18:46 Day, Ryan ([id number]) -------------------------------------------------------------------------------- HPI Details Patient Name: Ryan Day Date of Service: 10/04/2020 12:45 PM Medical Record Number: [id number] Patient Account Number: [id number] Date of Birth/Sex: Sep 22, 1951 (70 y.o. M) Treating RN: Cornell Barman Primary Care Provider: Park Liter Other Clinician: Referring Provider: Tsosie Billing Treating Provider/Extender: Tito Dine in Treatment: 0 History of Present Illness HPI Description: ADMISSION 10/04/2020 Ryan Day is a 70 year old man who is here accompanied by his daughters. 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 He spent a long time in the neuro ICU after his surgical stabilization at [facility]. 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 I do not have much information about the start of this or how it progressed but he was readmitted to [facility] from 08/14/2020 through 08/27/2020 with an infected sacral pressure ulcer. He required a operative debridement by general surgery on 11/24. Wound culture grew Enterococcus and he is just completing 6 weeks of Zosyn under the tutelage of infectious disease. According to his daughters the heel wounds that he has started at that time although I am really not able to get much of a history on these. He came in with small areas on the right heel laterally and 2 on the left. They have been using a wound VAC on the sacrum  with a foam cover over the small superficial area on the right buttock under the VAC seal. And I think foam on the heels as well as the great toe on the left. He has a level 2 surface. Past medical history most of what I have relates to the cervical spine injury  he required a tracheostomy. 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 M) Treating RN: Cornell Barman Primary Care Provider: Park Liter Other Clinician: Referring Provider: Tsosie Billing Treating Provider/Extender: Ricard Dillon Weeks in Treatment: 0 Constitutional Sitting or standing Blood Pressure is within target range for patient.. Pulse regular and within target range for patient.Marland Kitchen Respirations regular, non- labored and within target range.. Temperature is normal and within the target range for the patient.Marland Kitchen appears in no distress. Respiratory Tracheostomy no distress. Cardiovascular Pedal pulses are palpable bilaterally although they feel somewhat reduced. Psychiatric No evidence of depression, anxiety, or agitation. Calm, cooperative, and communicative. Appropriate interactions and affect.. Notes Wound exam o Stage IV wound over the sacrum. This has direct depth and undermining at 12:00 and 6:00. Most of the undermining is at 12:00 I could not feel any palpable bone although there is considerable depth here. He has a superficial area on the right buttock stage II. Neither 1 of these areas looks particularly  infected. o The major area on his feet was on the left heel. Superficially he had 2 small areas on the medial and lateral plantar heel however the entire area was just eschar. I suspect this must have been a much larger wound at one point. Using pickups and a #10 scalpel I was able to remove most of it. A lot of this is epithelialized underneath however there is a more substantial area in the mid part of the heel which is still covered in a nonviable surface o He has a small area on the left toe which looks clean no debridement was necessary o Small area on the plantar medial right heel also small. Electronic Signature(s) Signed: 10/05/2020 12:50:01 PM By: Linton Ham MD Entered By: Linton Ham on 10/04/2020 14:56:43 Day, Ryan ([id number]) -------------------------------------------------------------------------------- Physician Orders Details Patient Name: Ryan Day Date of Service: 10/04/2020 12:45 PM Medical Record Number: [id number] Patient Account Number: [id number] Date of Birth/Sex: 01/08/1951 (69 y.o. M) Treating RN: Dolan Amen Primary Care Provider: Park Liter Other Clinician: Referring Provider: Tsosie Billing Treating Provider/Extender: Tito Dine in Treatment: 0 Verbal / Phone Orders: No Diagnosis Coding ICD-10 Coding Code Description L89.154 Pressure ulcer of sacral region, stage 4 L89.312 Pressure ulcer of right buttock, stage 2 L89.610 Pressure ulcer of right heel, unstageable X32.440 Pressure ulcer of left heel, stage 2 S14.104D Unspecified injury at C4 level of cervical spinal cord, subsequent encounter Follow-up Appointments o Return Appointment in 2 weeks. Bathing/ Shower/ Hygiene o Clean wound with Normal Saline or wound cleanser. o May shower; gently cleanse wound with antibacterial soap, rinse and pat dry prior to dressing wounds Off-Loading Wound #1 Right Gluteus o Turn and reposition every 2 hours Wound #2  Sacrum o Turn and reposition every 2 hours Wound #3 Right Calcaneus o Heel suspension boot o Turn and reposition every 2 hours Wound #4 Left Calcaneus o Heel suspension boot o Turn and reposition every 2 hours Wound #6 Left Toe Great o Turn and reposition every 2 hours Negative Pressure Wound Therapy Wound #2 Sacrum o Wound VAC settings at 125/130 mmHg continuous pressure. Use foam to wound cavity. Use WHITE foam to  fill any tunnel/s and/or undermining. Change VAC dressing 2 X WEEK. Change canister as indicated when full. Nurse may titrate settings and frequency of dressing changes as clinically indicated. - Cont. NPWT as ordered o Number of foam/gauze pieces used in the dressing = - 2 Wound Treatment Wound #1 - Gluteus Wound Laterality: Right Cleanser: [place] Ancillary Kit - 15 Day Supply Every Other Day/30 Days Discharge Instructions: Use supplies as instructed; Kit contains: (15) Saline Bullets; (15) 3x3 Gauze; 15 pr Gloves Secondary Dressing: Mepilex Border Flex, 4x4 (in/in) Every Other Day/30 Days Discharge Instructions: Apply to wound as directed. Do not cut. Wound #2 - Sacrum Primary Dressing: Promogran Matrix 4.34 (in) (DME) (Generic) Every Other Day/30 Days Discharge Instructions: Moisten w/normal saline or sterile water; Cover wound as directed. Do not remove from wound bed. Wound #3 - Calcaneus Wound Laterality: Right Day, Ryan ([id number]) Primary Dressing: Hydrofera Blue Ready Transfer Foam, 4x5 (in/in) (DME) (Generic) Every Other Day/30 Days Discharge Instructions: Apply Hydrofera Blue Ready to wound bed as directed Secondary Dressing: Kerlix 4.5 x 4.1 (in/yd) (DME) (Generic) Every Other Day/30 Days Discharge Instructions: Apply Kerlix 4.5 x 4.1 (in/yd) as instructed Add-Ons: ALLEVYN Heel 4 1/2in x 5 1/2in / 10.5cm x 13.5cm (DME) (Generic) Every Other Day/30 Days Discharge Instructions: Apply the white, patterned surface to the heel. Wound #4 -  Calcaneus Wound Laterality: Left Primary Dressing: Hydrofera Blue Ready Transfer Foam, 4x5 (in/in) (DME) (Generic) Every Other Day/30 Days Discharge Instructions: Apply Hydrofera Blue Ready to wound bed as directed Secondary Dressing: Kerlix 4.5 x 4.1 (in/yd) (DME) (Generic) Every Other Day/30 Days Discharge Instructions: Apply Kerlix 4.5 x 4.1 (in/yd) as instructed Add-Ons: ALLEVYN Heel 4 1/2in x 5 1/2in / 10.5cm x 13.5cm (DME) (Generic) Every Other Day/30 Days Discharge Instructions: Apply the white, patterned surface to the heel. Wound #6 - Toe Great Wound Laterality: Left Primary Dressing: Hydrofera Blue Ready Transfer Foam, 2.5x2.5 (in/in) (DME) (Generic) Every Other Day/30 Days Discharge Instructions: Apply Hydrofera Blue Ready to wound bed as directed Secondary Dressing: Conforming Guaze Roll-Small (DME) (Generic) Every Other Day/30 Days Discharge Instructions: [place] as directed Electronic Signature(s) Signed: 10/04/2020 4:32:07 PM By: Georges Mouse, Minus Breeding RN Signed: 10/05/2020 12:50:01 PM By: Linton Ham MD Previous Signature: 10/04/2020 3:32:47 PM Version By: Georges Mouse, Minus Breeding RN Entered By: Georges Mouse, Minus Breeding on 10/04/2020 16:21:27 Day, Ryan ([id number]) -------------------------------------------------------------------------------- Problem List Details Patient Name: Ryan Day Date of Service: 10/04/2020 12:45 PM Medical Record Number: [id number] Patient Account Number: [id number] Date of Birth/Sex: 01/11/1951 (69 y.o. M) Treating RN: Cornell Barman Primary Care Provider: Park Liter Other Clinician: Referring Provider: Tsosie Billing Treating Provider/Extender: Tito Dine in Treatment: 0 Active Problems ICD-10 Encounter Code Description Active Date MDM Diagnosis L89.154 Pressure ulcer of sacral region, stage 4 10/04/2020 No Yes L89.312 Pressure ulcer of right buttock, stage 2 10/04/2020 No Yes L89.610  Pressure ulcer of right heel, unstageable 10/04/2020 No Yes L89.622 Pressure ulcer of left heel, stage 2 10/04/2020 No Yes S14.104D Unspecified injury at C4 level of cervical spinal cord, subsequent 10/04/2020 No Yes encounter Inactive Problems Resolved Problems Electronic Signature(s) Signed: 10/05/2020 12:50:01 PM By: Linton Ham MD Entered By: Linton Ham on 10/04/2020 14:32:57 Day, Ryan ([id number]) -------------------------------------------------------------------------------- Progress Note Details Patient Name: Ryan Day Date of Service: 10/04/2020 12:45 PM Medical Record Number: [id number] Patient Account Number: [id number] Date of Birth/Sex: 1950-11-12 (69 y.o. M) Treating RN: Cornell Barman Primary Care Provider: Park Liter Other Clinician: Referring Provider: Tsosie Billing Treating Provider/Extender: Linton Ham  G Weeks in Treatment: 0 Subjective Chief Complaint Information obtained from Patient 10/04/2020; patient is here for review of wounds on the sacrum, buttock and bilateral heels History of Present Illness (HPI) ADMISSION 10/04/2020 Ryan Day is a 70 year old man who is here accompanied by his daughters. 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 He spent a long time in the neuro ICU after his surgical stabilization at [facility]. 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 I do not have much information about the start of this or how it progressed but he was readmitted to [facility] from 08/14/2020 through 08/27/2020 with an infected sacral pressure ulcer. He required a operative debridement by general surgery on 11/24. Wound culture grew Enterococcus and he is just completing 6 weeks of Zosyn under the tutelage of infectious disease. According to his daughters the heel wounds that he has started at that time although I am really not able to get much of  a history on these. He came in with small areas on the right heel laterally and 2 on the left. They have been using a wound VAC on the sacrum with a foam cover over the small superficial area on the right buttock under the VAC seal. And I think foam on the heels as well as the great toe on the left. He has a level 2 surface. 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 Allergies No Known Drug Allergies Social History Never smoker, Marital Status - Married, Alcohol Use - Never, Drug Use - No History, Caffeine Use - Never. Medical History Cardiovascular Patient has history of Hypotension Denies history of Hypertension Endocrine Denies history of Type I Diabetes, Type II Diabetes Integumentary (Skin) Denies history of History of pressure wounds Neurologic Patient has history of Quadriplegia - cervical spinal cord injury Medical And Surgical History Notes Constitutional Symptoms (General Health) Spinal Cord Injury; April 26, 2020, Quadriplegia; Trach, High Blood Pressure, Stage 4 Pressure Wound Gastrointestinal Feeding tube Genitourinary Foley catheter Integumentary (Skin) Sacral Ulcer during ICU stay at [facility] October 2021 Review of Systems (ROS) Eyes Denies complaints or symptoms of Dry Eyes, Vision Changes, Glasses / Contacts. Ear/Nose/Mouth/Throat Denies complaints or symptoms of Difficult clearing ears. Respiratory Complains or has symptoms of Chronic or frequent coughs - from secretions. Immunological Denies complaints or symptoms of Hives, Itching. Integumentary (Skin) Complains or has symptoms of Wounds - Sacral, Bilateral Heels. Ryan Day, Ryan Day ([id number]) Objective Constitutional Sitting or standing Blood Pressure is within target range for  patient.. Pulse regular and within target range for patient.Marland Kitchen Respirations regular, non- labored and within target range.. Temperature is normal and within the target range for the patient.Marland Kitchen appears in no distress. Vitals Time Taken: 1:11 PM, Height: 77 in, Weight: 240 lbs, BMI: 28.5, Pulse: 76 bpm, Respiratory Rate: 18 breaths/min, Blood Pressure: 138/85 mmHg. General Notes: Patient arrives today in wheelchair unable to get height or weight. Unable to obtain temperature. Respiratory Tracheostomy no distress. Cardiovascular Pedal pulses are palpable bilaterally although they feel somewhat reduced. Psychiatric No evidence of depression, anxiety, or agitation. Calm, cooperative, and communicative. Appropriate interactions and affect.. General Notes: Wound exam Stage IV wound over the sacrum. This has direct depth and undermining at 12:00 and  6:00. Most of the undermining is at 12:00 I could not feel any palpable bone although there is considerable depth here. He has a superficial area on the right buttock stage II. Neither 1 of these areas looks particularly infected. The major area on his feet was on the left heel. Superficially he had 2 small areas on the medial and lateral plantar heel however the entire area was just eschar. I suspect this must have been a much larger wound at one point. Using pickups and a #10 scalpel I was able to remove most of it. A lot of this is epithelialized underneath however there is a more substantial area in the mid part of the heel which is still covered in a nonviable surface He has a small area on the left toe which looks clean no debridement was necessary Small area on the plantar medial right heel also small. Integumentary (Hair, Skin) Wound #1 status is Open. Original cause of wound was Pressure Injury. The wound is located on the Right Gluteus. The wound measures 1.1cm length x 1.6cm width x 0.1cm depth; 1.382cm^2 area and 0.138cm^3 volume. Wound #2 status  is Open. Original cause of wound was Pressure Injury. The wound is located on the Sacrum. The wound measures 3.2cm length x 1.6cm width x 3.5cm depth; 4.021cm^2 area and 14.074cm^3 volume. There is undermining starting at 12:00 and ending at 3:00 with a maximum distance of 4.1cm. There is a large amount of serous drainage noted. The wound margin is flat and intact. There is large (67-100%) granulation within the wound bed. There is no necrotic tissue within the wound bed. Wound #3 status is Open. Original cause of wound was Pressure Injury. The wound is located on the Right Calcaneus. The wound measures 1cm length x 0.6cm width x 0.1cm depth; 0.471cm^2 area and 0.047cm^3 volume. There is Fat Layer (Subcutaneous Tissue) exposed. There is no tunneling or undermining noted. There is a large amount of serous drainage noted. The wound margin is flat and intact. There is large (67-100%) red granulation within the wound bed. There is a small (1-33%) amount of necrotic tissue within the wound bed including Adherent Slough. Wound #4 status is Open. Original cause of wound was Pressure Injury. The wound is located on the Left,Lateral Calcaneus. The wound measures 2cm length x 2.8cm width x 0.1cm depth; 4.398cm^2 area and 0.44cm^3 volume. There is Fat Layer (Subcutaneous Tissue) exposed. There is no tunneling or undermining noted. There is a medium amount of serous drainage noted. The wound margin is flat and intact. There is large (67- 100%) red granulation within the wound bed. There is a small (1-33%) amount of necrotic tissue within the wound bed including Adherent Slough. Wound #5 status is Open. Original cause of wound was Pressure Injury. The wound is located on the Left,Medial Calcaneus. The wound measures 1.6cm length x 2.5cm width x 0.1cm depth; 3.142cm^2 area and 0.314cm^3 volume. There is Fat Layer (Subcutaneous Tissue) exposed. There is no tunneling or undermining noted. There is a medium amount of  serous drainage noted. The wound margin is flat and intact. There is large (67-100%) red granulation within the wound bed. There is a small (1-33%) amount of necrotic tissue within the wound bed including Adherent Slough. Wound #6 status is Open. Original cause of wound was Pressure Injury. The wound is located on the Left Toe Great. The wound measures 2.2cm length x 1.5cm width x 0.1cm depth; 2.592cm^2 area and 0.259cm^3 volume. There is no tunneling or undermining noted.  There is a medium amount of serous drainage noted. The wound margin is flat and intact. There is large (67-100%) red granulation within the wound bed. There is a small (1-33%) amount of necrotic tissue within the wound bed including Adherent Slough. Assessment Active Problems ICD-10 Pressure ulcer of sacral region, stage 4 Blais, Ryan Day ([id number]) Pressure ulcer of right buttock, stage 2 Pressure ulcer of right heel, unstageable Pressure ulcer of left heel, stage 2 Unspecified injury at C4 level of cervical spinal cord, subsequent encounter Plan 1. Stage IV pressure ulcer. I agree with the wound VAC I am not sure how much improvement they have had to date. I would like to put plain collagen under the VAC foam 2. Small superficial area on the right buttock I think they continue to foam under the VAC seal 3. The next big problem was the left heel which was really just a substantial thick eschar surrounding the open wound he had. This separated I gradually removed most of this. Substantial open areas still. Hopefully this will give some of this and edge for healing. Debridement with a curette of the nonviable area He is here. We are going to use Hydrofera Blue to these areas 4. Hydrofera Blue on the left toe and the right heel as well with heel cups and gauze 5. I would like to do ABIs next time. The patient is not a diabetic 6. I would like to know if there is been any x-rays done of any of these areas during the  hospitalizations. I will try to look through care everywhere in epic to determine this 7. Patient is completing Zosyn for Enterococcus cultured during the debridement in the OR on 11/24 he was treated for 6 weeks presumably osteomyelitis I spent 45 minutes in review of this patient's past medical history, face-to-face evaluation and preparation of this record I am going to have to look back to see if there was any imaging of the sacrum and particularly the left heel Electronic Signature(s) Signed: 10/05/2020 12:50:01 PM By: Linton Ham MD Entered By: Linton Ham on 10/04/2020 15:04:15 Day, Ryan ([id number]) -------------------------------------------------------------------------------- ROS/PFSH Details Patient Name: Ryan Day Date of Service: 10/04/2020 12:45 PM Medical Record Number: [id number] Patient Account Number: [id number] Date of Birth/Sex: 04-04-1951 (69 y.o. M) Treating RN: Cornell Barman Primary Care Provider: Park Liter Other Clinician: Referring Provider: Tsosie Billing Treating Provider/Extender: Tito Dine in Treatment: 0 Information Obtained From Patient Eyes Complaints and Symptoms: Negative for: Dry Eyes; Vision Changes; Glasses / Contacts Ear/Nose/Mouth/Throat Complaints and Symptoms: Negative for: Difficult clearing ears Respiratory Complaints and Symptoms: Positive for: Chronic or frequent coughs - from secretions Immunological Complaints and Symptoms: Negative for: Hives; Itching Integumentary (Skin) Complaints and Symptoms: Positive for: Wounds - Sacral, Bilateral Heels Medical History: Negative for: History of pressure wounds Past Medical History Notes: Sacral Ulcer during ICU stay at [facility] October 2021 Constitutional Symptoms (General Health) Medical History: Past Medical History Notes: Spinal Cord Injury; April 26, 2020, Quadriplegia; Trach, High Blood Pressure, Stage 4 Pressure Wound Cardiovascular Medical  History: Positive for: Hypotension Negative for: Hypertension Gastrointestinal Medical History: Past Medical History Notes: Feeding tube Endocrine Medical History: Negative for: Type I Diabetes; Type II Diabetes Genitourinary Medical History: Past Medical History NotesANA, LIAW ([id number]) Foley catheter Neurologic Medical History: Positive for: Quadriplegia - cervical spinal cord injury Immunizations Pneumococcal Vaccine: Received Pneumococcal Vaccination: Yes Implantable Devices None Family and Social History Never smoker; Marital Status - Married; Alcohol Use: Never; Drug Use: No History; Caffeine Use:  Never Electronic Signature(s) Signed: 10/05/2020 12:50:01 PM By: Linton Ham MD Signed: 10/07/2020 10:48:52 AM By: Gretta Cool, BSN, RN, CWS, Kim RN, BSN Entered By: Gretta Cool, BSN, RN, CWS, Kim on 10/04/2020 13:23:51 [facility], Ryan ([id number]) -------------------------------------------------------------------------------- SuperBill Details Patient Name: Ryan Day Date of Service: 10/04/2020 Medical Record Number: [id number] Patient Account Number: [id number] Date of Birth/Sex: [birth year] (69 y.o. M) Treating RN: Cornell Barman Primary Care Provider: Park Liter Other Clinician: Referring Provider: Tsosie Billing Treating Provider/Extender: Tito Dine in Treatment: 0 Diagnosis Coding ICD-10 Codes Code Description L89.154 Pressure ulcer of sacral region, stage 4 L89.312 Pressure ulcer of right buttock, stage 2 S14.104D Unspecified injury at C4 level of cervical spinal cord, subsequent encounter L89.612 Pressure ulcer of right heel, stage 2 L89.620 Pressure ulcer of left heel, unstageable Facility Procedures CPT4 Code: [id number] Description: 96418 - WOUND CARE VISIT-LEV 4 EST PT Modifier: Quantity: 1 CPT4 Code: [id number] Description: 64660 - DEBRIDE WOUND 1ST 20 SQ CM OR < Modifier: Quantity: 1 CPT4 Code: Description: ICD-10 Diagnosis Description  L89.620 Pressure ulcer of left heel, unstageable Modifier: Quantity: Physician Procedures CPT4 Code: [id number] Description: 26270 - WC PHYS LEVEL 4 - NEW PT Modifier: 25 Quantity: 1 CPT4 Code: Description: ICD-10 Diagnosis Description L89.154 Pressure ulcer of sacral region, stage 4 L89.312 Pressure ulcer of right buttock, stage 2 L89.612 Pressure ulcer of right heel, stage 2 L89.620 Pressure ulcer of left heel, unstageable Modifier: Quantity: CPT4 Code: [id number] Description: 51686 - WC PHYS DEBR WO ANESTH 20 SQ CM Modifier: Quantity: 1 CPT4 Code: Description: ICD-10 Diagnosis Description L89.620 Pressure ulcer of left heel, unstageable Modifier: Quantity: Electronic Signature(s) Signed: 10/04/2020 4:13:34 PM By: Georges Mouse, Minus Breeding RN Signed: 10/05/2020 12:50:01 PM By: Linton Ham MD Entered By: Georges Mouse, Minus Breeding on 10/04/2020 16:13:34

## 2020-10-07 NOTE — Progress Notes (Addendum)
Ryan, Day ([id number]) Visit Report for 10/04/2020 Allergy List Details Patient Name: Ryan, Day Date of Service: 10/04/2020 12:45 PM Medical Record Number: [id number] Patient Account Number: [id number] Date of Birth/Sex: 10/14/1950 (70 y.o. M) Treating RN: Ryan Day Primary Care Ryan Day: Ryan Day Other Clinician: Referring Ryan Day: Ryan Day Treating Ryan Day/Extender: Ryan Day Weeks in Treatment: 0 Allergies Active Allergies No Known Drug Allergies Type: Allergen Allergy Notes Electronic Signature(s) Signed: 10/07/2020 10:48:52 AM By: Gretta Cool, BSN, RN, CWS, Kim RN, BSN Entered By: Gretta Cool, BSN, RN, CWS, Kim on 10/04/2020 13:13:02 [place], Ryan Day ([id number]) -------------------------------------------------------------------------------- Arrival Information Details Patient Name: Ryan Day Date of Service: 10/04/2020 12:45 PM Medical Record Number: [id number] Patient Account Number: [id number] Date of Birth/Sex: 1950/12/19 (69 y.o. M) Treating RN: Ryan Day Primary Care Ryan Day: Ryan Day Other Clinician: Referring Ryan Day: Ryan Day Treating Ryan Day/Extender: Ryan Day in Treatment: 0 Visit Information Patient Arrived: Wheel Chair Arrival Time: 13:07 Accompanied By: daughters Transfer Assistance: Civil Service fast streamer Patient Identification Verified: Yes Secondary Verification Process Completed: Yes Patient Has Alerts: Yes Patient Alerts: xalerto NOT DIABETIC Electronic Signature(s) Signed: 10/04/2020 4:04:09 PM By: Ryan Day, Ryan Breeding RN Entered By: Ryan Day, Ryan Day on 10/04/2020 16:04:08 [place], [place] ([id number]) -------------------------------------------------------------------------------- Clinic Level of Care Assessment Details Patient Name: Ryan Day Date of Service: 10/04/2020 12:45 PM Medical Record Number: [id number] Patient Account Number: [id number] Date of Birth/Sex: 1950/12/01 (69 y.o. M) Treating  RN: Ryan Day Primary Care Ryan Day: Ryan Day Other Clinician: Referring Ryan Day: Ryan Day Treating Arsh Feutz/Extender: Ryan Day in Treatment: 0 Clinic Level of Care Assessment Items TOOL 3 Quantity Score X - Use when EandM and Procedure is performed on FOLLOW-UP visit 1 0 ASSESSMENTS - Nursing Assessment / Reassessment X - Reassessment of Co-morbidities (includes updates in patient status) 1 10 X- 1 5 Reassessment of Adherence to Treatment Plan ASSESSMENTS - Wound and Skin Assessment / Reassessment '[]'  - Points for Wound Assessment can only be taken for a new wound of unknown or different etiology and a 0 procedure is NOT performed to that wound '[]'  - 0 Simple Wound Assessment / Reassessment - one wound X- 1 5 Complex Wound Assessment / Reassessment - multiple wounds '[]'  - 0 Dermatologic / Skin Assessment (not related to wound area) ASSESSMENTS - Focused Assessment '[]'  - Circumferential Edema Measurements - multi extremities 0 '[]'  - 0 Nutritional Assessment / Counseling / Intervention '[]'  - 0 Lower Extremity Assessment (monofilament, tuning fork, pulses) '[]'  - 0 Peripheral Arterial Disease Assessment (using hand held doppler) ASSESSMENTS - Ostomy and/or Continence Assessment and Care '[]'  - Incontinence Assessment and Management 0 '[]'  - 0 Ostomy Care Assessment and Management (repouching, etc.) PROCESS - Coordination of Care '[]'  - Points for Discharge Coordination can only be taken for a new wound of unknown or different etiology and a 0 procedure is NOT performed to that wound X- 1 15 Simple Patient / Family Education for ongoing care '[]'  - 0 Complex (extensive) Patient / Family Education for ongoing care '[]'  - 0 Staff obtains Programmer, systems, Records, Test Results / Process Orders '[]'  - 0 Staff telephones HHA, Nursing Homes / Clarify orders / etc '[]'  - 0 Routine Transfer to another Facility (non-emergent condition) '[]'  - 0 Routine Hospital  Admission (non-emergent condition) '[]'  - 0 New Admissions / Biomedical engineer / Ordering NPWT, Apligraf, etc. '[]'  - 0 Emergency Hospital Admission (emergent condition) X- 1 10 Simple Discharge Coordination '[]'  - 0 Complex (extensive) Discharge Coordination PROCESS - Special Needs '[]'  - Pediatric / Minor Patient Management 0 '[]'  -  0 Isolation Patient Management '[]'  - 0 Hearing / Language / Visual special needs '[]'  - 0 Assessment of Community assistance (transportation, D/C planning, etc.) Day, Ryan ([id number]) '[]'  - 0 Additional assistance / Altered mentation '[]'  - 0 Support Surface(s) Assessment (bed, cushion, seat, etc.) INTERVENTIONS - Wound Cleansing / Measurement '[]'  - Points for Wound Cleaning / Measurement, Wound Dressing, Specimen Collection and Specimen taken to lab 0 can only be taken for a new wound of unknown or different etiology and a procedure is NOT performed to that wound '[]'  - 0 Simple Wound Cleansing - one wound X- 5 5 Complex Wound Cleansing - multiple wounds X- 1 5 Wound Imaging (photographs - any number of wounds) '[]'  - 0 Wound Tracing (instead of photographs) '[]'  - 0 Simple Wound Measurement - one wound X- 5 5 Complex Wound Measurement - multiple wounds INTERVENTIONS - Wound Dressings '[]'  - Small Wound Dressing one or multiple wounds 0 X- 1 15 Medium Wound Dressing one or multiple wounds '[]'  - 0 Large Wound Dressing one or multiple wounds INTERVENTIONS - Miscellaneous '[]'  - External ear exam 0 '[]'  - 0 Specimen Collection (cultures, biopsies, blood, body fluids, etc.) '[]'  - 0 Specimen(s) / Culture(s) sent or taken to Lab for analysis '[]'  - 0 Patient Transfer (multiple staff / Civil Service fast streamer / Similar devices) '[]'  - 0 Simple Staple / Suture removal (25 or less) '[]'  - 0 Complex Staple / Suture removal (26 or more) '[]'  - 0 Hypo / Hyperglycemic Management (close monitor of Blood Glucose) '[]'  - 0 Ankle / Brachial Index (ABI) - do not check if billed  separately X- 1 5 Vital Signs Has the patient been seen at the hospital within the last three years: Yes Total Score: 120 Level Of Care: New/Established - Level 4 Electronic Signature(s) Signed: 10/04/2020 4:32:07 PM By: Ryan Day, Ryan Breeding RN Entered By: Ryan Day, Ryan Day on 10/04/2020 16:13:21 [place], Andriy ([id number]) -------------------------------------------------------------------------------- Encounter Discharge Information Details Patient Name: Ryan Day Date of Service: 10/04/2020 12:45 PM Medical Record Number: [id number] Patient Account Number: [id number] Date of Birth/Sex: 1951/07/10 (69 y.o. M) Treating RN: Ryan Day Primary Care Yisell Sprunger: Ryan Day Other Clinician: Referring Corbin Falck: Ryan Day Treating Hall Birchard/Extender: Ryan Day in Treatment: 0 Encounter Discharge Information Items Post Procedure Vitals Discharge Condition: Stable Pulse (bpm): 76 Ambulatory Status: Wheelchair Respiratory Rate 18 (breaths/min): Discharge Destination: Home Blood Pressure Transportation: Private Auto 138/85 (mmHg): Accompanied By: daughters Unable to obtain vitals Patient arrives today in wheelchair unable to get Schedule Follow-up Appointment: Yes Reason: height or weight. Unable to obtain temperature. Clinical Summary of Care: Electronic Signature(s) Signed: 10/04/2020 4:11:53 PM By: Ryan Day, Ryan Breeding RN Entered By: Ryan Day, Ryan Day on 10/04/2020 16:11:53 [place], Odell ([id number]) -------------------------------------------------------------------------------- Lower Extremity Assessment Details Patient Name: Ryan Day Date of Service: 10/04/2020 12:45 PM Medical Record Number: [id number] Patient Account Number: [id number] Date of Birth/Sex: Feb 10, 1951 (69 y.o. M) Treating RN: Ryan Day Primary Care Meylin Stenzel: Ryan Day Other Clinician: Referring Loriann Bosserman: Ryan Day Treating Benay Pomeroy/Extender: Ryan Day in Treatment: 0 Vascular Assessment Pulses: Dorsalis Pedis Palpable: [Left:No] [Right:No] Doppler Audible: [Left:Yes] [Right:Yes] Posterior Tibial Palpable: [Left:No Yes] [Right:No Yes] Electronic Signature(s) Signed: 10/07/2020 10:48:52 AM By: Gretta Cool, BSN, RN, CWS, Kim RN, BSN Entered By: Gretta Cool, BSN, RN, CWS, Kim on 10/04/2020 13:40:38 [place], [place] ([id number]) -------------------------------------------------------------------------------- Multi Wound Chart Details Patient Name: Ryan Day Date of Service: 10/04/2020 12:45 PM Medical Record Number: [id number] Patient Account Number: [id number] Date of Birth/Sex: 07/12/1951 (69 y.o. M) Treating RN: Ryan Day  Primary Care Samiha Denapoli: Ryan Day Other Clinician: Referring Belinda Bringhurst: Ryan Day Treating Naeem Quillin/Extender: Ryan Day in Treatment: 0 Vital Signs Height(in): 77 Pulse(bpm): 76 Weight(lbs): 240 Blood Pressure(mmHg): 138/85 Body Mass Index(BMI): 28 Temperature(F): Respiratory Rate(breaths/min): 18 Photos: [1:No Photos] [2:No Photos] [3:No Photos] Wound Location: [1:Right Gluteus] [2:Sacrum] [3:Right Calcaneus] Wounding Event: [1:Pressure Injury] [2:Pressure Injury] [3:Pressure Injury] Primary Etiology: [1:Pressure Ulcer] [2:Pressure Ulcer] [3:Pressure Ulcer] Comorbid History: [1:N/A] [2:Hypotension, Quadriplegia] [3:Hypotension, Quadriplegia] Date Acquired: [1:06/23/2020] [2:06/23/2020] [3:08/18/2020] Weeks of Treatment: [1:0] [2:0] [3:0] Wound Status: [1:Open] [2:Open] [3:Open] Measurements L x W x D (cm) [1:1.1x1.6x0.1] [2:3.2x1.6x3.5] [3:1x0.6x0.1] Area (cm) : [1:1.382] [2:4.021] [3:0.471] Volume (cm) : [1:0.138] [2:14.074] [3:0.047] % Reduction in Area: [1:N/A] [2:0.00%] [3:N/A] % Reduction in Volume: [1:N/A] [2:0.00%] [3:N/A] Starting Position 1 (o'clock): [2:12] Ending Position 1 (o'clock): [2:3] Maximum Distance 1 (cm): [2:4.1] Undermining: [1:N/A] [2:Yes]  [3:No] Classification: [1:N/A] [2:Category/Stage IV] [3:Category/Stage II] Exudate Amount: [1:N/A] [2:Large] [3:Large] Exudate Type: [1:N/A] [2:Serous] [3:Serous] Exudate Color: [1:N/A] [2:amber] [3:amber] Wound Margin: [1:N/A] [2:Flat and Intact] [3:Flat and Intact] Granulation Amount: [1:N/A] [2:Large (67-100%)] [3:Large (67-100%)] Granulation Quality: [1:N/A] [2:N/A] [3:Red] Necrotic Amount: [1:N/A] [2:None Present (0%)] [3:Small (1-33%)] Epithelialization: [1:N/A] [2:N/A] [3:None] Debridement: [1:N/A] [2:N/A] [3:N/A] Tissue Debrided: [1:N/A] [2:N/A] [3:N/A] Level: [1:N/A] [2:N/A] [3:N/A] Debridement Area (sq cm): [1:N/A] [2:N/A] [3:N/A] Instrument: [1:N/A] [2:N/A] [3:N/A] Bleeding: [1:N/A] [2:N/A] [3:N/A] Hemostasis Achieved: [1:N/A] [2:N/A] [3:N/A] Debridement Treatment [1:N/A] [2:N/A] [3:N/A] Response: Post Debridement [1:N/A] [2:N/A] [3:N/A] Measurements L x W x D (cm) Post Debridement Volume: [1:N/A] [2:N/A] [3:N/A] (cm) Post Debridement Stage: [1:N/A N/A] [2:N/A N/A] [3:N/A N/A] Wound Number: '4 5 6 ' Photos: No Photos No Photos No Photos Wound Location: Left, Lateral Calcaneus Left, Medial Calcaneus Left Toe Great Wounding Event: Pressure Injury Pressure Injury Pressure Injury Primary Etiology: Pressure Ulcer Pressure Ulcer Pressure Ulcer Comorbid History: Hypotension, Quadriplegia Hypotension, Quadriplegia Hypotension, Quadriplegia Date Acquired: 08/18/2020 08/18/2020 08/18/2020 Weeks of Treatment: 0 0 0 Wound Status: Open Open Open Measurements L x W x D (cm) 2x2.8x0.1 1.6x2.5x0.1 2.2x1.5x0.1 Sciandra, Richard ([id number]) Area (cm) : 4.398 3.142 2.592 Volume (cm) : 0.44 0.314 0.259 % Reduction in Area: N/A 0.00% N/A % Reduction in Volume: N/A 0.00% N/A Undermining: No No No Classification: Category/Stage II Category/Stage II Category/Stage II Exudate Amount: Medium Medium Medium Exudate Type: Serous Serous Serous Exudate Color: amber amber amber Wound Margin: Flat  and Intact Flat and Intact Flat and Intact Granulation Amount: Large (67-100%) Large (67-100%) Large (67-100%) Granulation Quality: Red Red Red Necrotic Amount: Small (1-33%) Small (1-33%) Small (1-33%) Exposed Structures: Fat Layer (Subcutaneous Tissue): Fat Layer (Subcutaneous Tissue): N/A Yes Yes Fascia: No Fascia: No Tendon: No Tendon: No Muscle: No Muscle: No Joint: No Joint: No Bone: No Bone: No Epithelialization: None None None Debridement: Debridement - Selective/Open N/A N/A Wound Pre-procedure Verification/Time 14:00 N/A N/A Out Taken: Tissue Debrided: Necrotic/Eschar N/A N/A Level: Non-Viable Tissue N/A N/A Debridement Area (sq cm): 20 N/A N/A Instrument: Blade, Forceps, Scissors N/A N/A Bleeding: Minimum N/A N/A Hemostasis Achieved: Pressure N/A N/A Debridement Treatment Procedure was tolerated well N/A N/A Response: Post Debridement 4x5x0.2 N/A N/A Measurements L x W x D (cm) Post Debridement Volume: 3.142 N/A N/A (cm) Post Debridement Stage: Category/Stage II N/A N/A Procedures Performed: Debridement N/A N/A Treatment Notes Electronic Signature(s) Signed: 10/04/2020 3:15:20 PM By: Gretta Cool, BSN, RN, CWS, Kim RN, BSN Entered By: Gretta Cool, BSN, RN, CWS, Kim on 10/04/2020 15:15:20 [place], Taishawn ([id number]) -------------------------------------------------------------------------------- Multi-Disciplinary Care Plan Details Patient Name: Ryan Day Date of Service: 10/04/2020 12:45 PM Medical Record Number:  [id number] Patient Account Number: [id number] Date of Birth/Sex: Nov 02, 1950 (70 y.o. M) Treating RN: Ryan Day Primary Care Meko Bellanger: Ryan Day Other Clinician: Referring Jakaiya Netherland: Ryan Day Treating Loa Idler/Extender: Ryan Day in Treatment: 0 Active Inactive Necrotic Tissue Nursing Diagnoses: Impaired tissue integrity related to necrotic/devitalized tissue Knowledge deficit related to management of necrotic/devitalized  tissue Goals: Necrotic/devitalized tissue will be minimized in the wound bed Date Initiated: 10/04/2020 Target Resolution Date: 10/04/2020 Goal Status: Active Patient/caregiver will verbalize understanding of reason and process for debridement of necrotic tissue Date Initiated: 10/04/2020 Target Resolution Date: 10/04/2020 Goal Status: Active Interventions: Assess patient pain level pre-, during and post procedure and prior to discharge Provide education on necrotic tissue and debridement process Treatment Activities: Excisional debridement : 10/04/2020 Notes: Orientation to the Wound Care Program Nursing Diagnoses: Knowledge deficit related to the wound healing center program Goals: Patient/caregiver will verbalize understanding of the [facility] Date Initiated: 10/04/2020 Target Resolution Date: 10/04/2020 Goal Status: Active Interventions: Provide education on orientation to the wound center Notes: Pressure Nursing Diagnoses: Knowledge deficit related to causes and risk factors for pressure ulcer development Knowledge deficit related to management of pressures ulcers Goals: Patient will remain free from development of additional pressure ulcers Date Initiated: 10/04/2020 Target Resolution Date: 10/04/2020 Goal Status: Active Interventions: Assess: immobility, friction, shearing, incontinence upon admission and as needed NotesKYRIN, GRATZ ([id number]) Wound/Skin Impairment Nursing Diagnoses: Impaired tissue integrity Goals: Patient/caregiver will verbalize understanding of skin care regimen Date Initiated: 10/04/2020 Target Resolution Date: 10/04/2020 Goal Status: Active Ulcer/skin breakdown will have a volume reduction of 30% by week 4 Date Initiated: 10/04/2020 Target Resolution Date: 11/04/2020 Goal Status: Active Interventions: Assess ulceration(s) every visit Treatment Activities: Skin care regimen initiated : 10/04/2020 Topical wound management  initiated : 10/04/2020 Notes: Electronic Signature(s) Signed: 10/04/2020 3:15:05 PM By: Gretta Cool, BSN, RN, CWS, Kim RN, BSN Entered By: Gretta Cool, BSN, RN, CWS, Kim on 10/04/2020 15:15:05 [place], [place] ([id number]) -------------------------------------------------------------------------------- Negative Pressure Wound Therapy Application (NPWT) Details Patient Name: Ryan Day Date of Service: 10/04/2020 12:45 PM Medical Record Number: [id number] Patient Account Number: [id number] Date of Birth/Sex: 1951/01/01 (69 y.o. M) Treating RN: Ryan Day Primary Care Dallyn Bergland: Ryan Day Other Clinician: Referring Amelia Burgard: Ryan Day Treating Cable Fearn/Extender: Ryan Day Weeks in Treatment: 0 NPWT Application Performed for: Wound #2 Sacrum Additional Injuries Covered: No Performed By: Ryan Barman, RN Type: Other Coverage Size (sq cm): 5.12 Pressure Type: Constant Pressure Setting: 125 mmHG Drain Type: None Sponge/Dressing Type: Foam, Black Date Initiated: 10/04/2020 Response to Treatment: tolerated well Post Procedure Diagnosis Same as Pre-procedure Electronic Signature(s) Signed: 10/13/2020 1:36:01 PM By: Gretta Cool, BSN, RN, CWS, Kim RN, BSN Entered By: Gretta Cool, BSN, RN, CWS, Kim on 10/13/2020 13:36:01 [place], [place] ([id number]) -------------------------------------------------------------------------------- Pain Assessment Details Patient Name: Ryan Day Date of Service: 10/04/2020 12:45 PM Medical Record Number: [id number] Patient Account Number: [id number] Date of Birth/Sex: Oct 10, 1950 (69 y.o. M) Treating RN: Ryan Day Primary Care Aneta Hendershott: Ryan Day Other Clinician: Referring Linah Klapper: Ryan Day Treating Mihika Surrette/Extender: Ryan Day in Treatment: 0 Active Problems Location of Pain Severity and Description of Pain Patient Has Paino No Site Locations Pain Management and Medication Current Pain Management: Notes PAtient denies pain at  this time. Electronic Signature(s) Signed: 10/07/2020 10:48:52 AM By: Gretta Cool, BSN, RN, CWS, Kim RN, BSN Entered By: Gretta Cool, BSN, RN, CWS, Kim on 10/04/2020 13:10:59 [place], [place] ([id number]) -------------------------------------------------------------------------------- Patient/Caregiver Education Details Patient Name: Ryan Day Date of Service: 10/04/2020 12:45 PM Medical Record Number: [id number] Patient Account Number:  [id number] Date of Birth/Gender: 03/13/1951 (70 y.o. M) Treating RN: Ryan Day Primary Care Physician: Ryan Day Other Clinician: Referring Physician: Tsosie Day Treating Physician/Extender: Ryan Day in Treatment: 0 Education Assessment Education Provided To: Patient and Caregiver Education Topics Provided Wound/Skin Impairment: Methods: Explain/Verbal Responses: State content correctly Notes Educated daughters on wound care orders Electronic Signature(s) Signed: 10/04/2020 4:32:07 PM By: Ryan Day, Ryan Breeding RN Entered By: Ryan Day, Ryan Day on 10/04/2020 16:14:22 [place], [place] ([id number]) -------------------------------------------------------------------------------- Wound Assessment Details Patient Name: Ryan Day Date of Service: 10/04/2020 12:45 PM Medical Record Number: [id number] Patient Account Number: [id number] Date of Birth/Sex: 06/01/1951 (69 y.o. M) Treating RN: Ryan Day Primary Care Idaliz Tinkle: Ryan Day Other Clinician: Referring Evani Shrider: Ryan Day Treating Kimiko Common/Extender: Ryan Day Weeks in Treatment: 0 Wound Status Wound Number: 1 Primary Etiology: Pressure Ulcer Wound Location: Right Gluteus Wound Status: Open Wounding Event: Pressure Injury Date Acquired: 06/23/2020 Weeks Of Treatment: 0 Clustered Wound: No Photos Photo Uploaded By: Ryan Day, Ryan Day on 10/04/2020 16:33:30 Wound Measurements Length: (cm) Width: (cm) Depth: (cm) Area: (cm) Volume:  (cm) 1.1 % Reduction in Area: 1.6 % Reduction in Volume: 0.1 1.382 0.138 Treatment Notes Wound #1 (Gluteus) Wound Laterality: Right Cleanser [place]ram Ancillary Kit - 15 Day Supply Discharge Instruction: Use supplies as instructed; Kit contains: (15) Saline Bullets; (15) 3x3 Gauze; 15 pr Gloves Peri-Wound Care Topical Primary Dressing Secondary Dressing Mepilex Border Flex, 4x4 (in/in) Discharge Instruction: Apply to wound as directed. Do not cut. Secured With Compression Wrap Compression Stockings Add-Ons WILLIES, LAVIOLETTE ([id number]) Electronic Signature(s) Signed: 10/07/2020 10:48:52 AM By: Gretta Cool, BSN, RN, CWS, Kim RN, BSN Entered By: Gretta Cool, BSN, RN, CWS, Kim on 10/04/2020 13:29:24 DONNAVAN, COVAULT ([id number]) -------------------------------------------------------------------------------- Wound Assessment Details Patient Name: Ryan Day Date of Service: 10/04/2020 12:45 PM Medical Record Number: [id number] Patient Account Number: [id number] Date of Birth/Sex: [birth year] (69 y.o. M) Treating RN: Ryan Day Primary Care Adylene Dlugosz: Ryan Day Other Clinician: Referring Gerron Guidotti: Ryan Day Treating Dru Primeau/Extender: Ryan Day Weeks in Treatment: 0 Wound Status Wound Number: 2 Primary Etiology: Pressure Ulcer Wound Location: Sacrum Wound Status: Open Wounding Event: Pressure Injury Comorbid History: Hypotension, Quadriplegia Date Acquired: 06/23/2020 Weeks Of Treatment: 0 Clustered Wound: No Photos Photo Uploaded By: Ryan Day, Ryan Day on 10/04/2020 16:33:30 Wound Measurements Length: (cm) 3.2 Width: (cm) 1.6 Depth: (cm) 3.5 Area: (cm) 4.021 Volume: (cm) 14.074 % Reduction in Area: 0% % Reduction in Volume: 0% Undermining: Yes Starting Position (o'clock): 12 Ending Position (o'clock): 3 Maximum Distance: (cm) 4.1 Wound Description Classification: Category/Stage IV Wound Margin: Flat and Intact Exudate Amount: Medium Exudate Type:  Serous Exudate Color: amber Foul Odor After Cleansing: No Wound Bed Granulation Amount: Large (67-100%) Exposed Structure Necrotic Amount: None Present (0%) Fascia Exposed: No Fat Layer (Subcutaneous Tissue) Exposed: Yes Tendon Exposed: No Muscle Exposed: No Joint Exposed: No Bone Exposed: No Treatment Notes Wound #2 (Sacrum) Cleanser Hoard, Plez ([id number]) Peri-Wound Care Topical Primary Dressing Promogran Matrix 4.34 (in) Discharge Instruction: Moisten w/normal saline or sterile water; Cover wound as directed. Do not remove from wound bed. Secondary Dressing Secured With Compression Wrap Compression Stockings Add-Ons Electronic Signature(s) Signed: 10/04/2020 4:19:23 PM By: Charlett Nose RN Signed: 10/07/2020 10:48:52 AM By: Gretta Cool, BSN, RN, CWS, Kim RN, BSN Entered By: Ryan Day, Kenia on 10/04/2020 16:19:23 LINNEMANN, [place] ([id number]) -------------------------------------------------------------------------------- Wound Assessment Details Patient Name: Ryan Day Date of Service: 10/04/2020 12:45 PM Medical Record Number: [id number] Patient Account Number: [id number] Date of Birth/Sex: [birth year] (69 y.o. M) Treating RN: Ryan Day Primary  Care Vivianne Carles: Ryan Day Other Clinician: Referring Rebecca Cairns: Ryan Day Treating Tinslee Klare/Extender: Ryan Day Weeks in Treatment: 0 Wound Status Wound Number: 3 Primary Etiology: Pressure Ulcer Wound Location: Right Calcaneus Wound Status: Open Wounding Event: Pressure Injury Comorbid History: Hypotension, Quadriplegia Date Acquired: 08/18/2020 Weeks Of Treatment: 0 Clustered Wound: No Photos Photo Uploaded By: Ryan Day, Ryan Day on 10/04/2020 16:34:06 Wound Measurements Length: (cm) 1 Width: (cm) 0.6 Depth: (cm) 0.1 Area: (cm) 0.471 Volume: (cm) 0.047 % Reduction in Area: % Reduction in Volume: Epithelialization: None Tunneling: No Undermining: No Wound  Description Classification: Category/Stage II Wound Margin: Flat and Intact Exudate Amount: Large Exudate Type: Serous Exudate Color: amber Foul Odor After Cleansing: No Slough/Fibrino No Wound Bed Granulation Amount: Large (67-100%) Exposed Structure Granulation Quality: Red Fascia Exposed: No Necrotic Amount: Small (1-33%) Fat Layer (Subcutaneous Tissue) Exposed: Yes Necrotic Quality: Adherent Slough Tendon Exposed: No Muscle Exposed: No Joint Exposed: No Bone Exposed: No Treatment Notes Wound #3 (Calcaneus) Wound Laterality: Right Cleanser Peri-Wound Care Topical [place], Bleu ([id number]) Primary Dressing Hydrofera Blue Ready Transfer Foam, 4x5 (in/in) Discharge Instruction: Apply Hydrofera Blue Ready to wound bed as directed Secondary Dressing Kerlix 4.5 x 4.1 (in/yd) Discharge Instruction: Apply Kerlix 4.5 x 4.1 (in/yd) as instructed Secured With Compression Wrap Compression Stockings Add-Ons ALLEVYN Heel 4 1/2in x 5 1/2in / 10.5cm x 13.5cm Discharge Instruction: Apply the white, patterned surface to the heel. Electronic Signature(s) Signed: 10/07/2020 10:48:52 AM By: Gretta Cool, BSN, RN, CWS, Kim RN, BSN Entered By: Gretta Cool, BSN, RN, CWS, Kim on 10/04/2020 13:34:25 [place], [place] ([id number]) -------------------------------------------------------------------------------- Wound Assessment Details Patient Name: Ryan Day Date of Service: 10/04/2020 12:45 PM Medical Record Number: [id number] Patient Account Number: [id number] Date of Birth/Sex: Aug 18, 1951 (69 y.o. M) Treating RN: Ryan Day Primary Care Laycie Schriner: Ryan Day Other Clinician: Referring Ciaira Natividad: Ryan Day Treating Doniven Vanpatten/Extender: Ryan Day Weeks in Treatment: 0 Wound Status Wound Number: 4 Primary Etiology: Pressure Ulcer Wound Location: Left, Lateral Calcaneus Wound Status: Open Wounding Event: Pressure Injury Comorbid History: Hypotension, Quadriplegia Date Acquired:  08/18/2020 Weeks Of Treatment: 0 Clustered Wound: No Photos Photo Uploaded By: Ryan Day, Ryan Day on 10/04/2020 16:34:47 Wound Measurements Length: (cm) 4 Width: (cm) 5 Depth: (cm) 0.1 Area: (cm) 15.708 Volume: (cm) 1.571 % Reduction in Area: 0% % Reduction in Volume: 0% Epithelialization: None Tunneling: No Undermining: No Wound Description Classification: Category/Stage II Wound Margin: Flat and Intact Exudate Amount: Medium Exudate Type: Serous Exudate Color: amber Foul Odor After Cleansing: No Slough/Fibrino No Wound Bed Granulation Amount: None Present (0%) Exposed Structure Necrotic Amount: Large (67-100%) Fascia Exposed: No Necrotic Quality: Eschar Fat Layer (Subcutaneous Tissue) Exposed: Yes Tendon Exposed: No Muscle Exposed: No Joint Exposed: No Bone Exposed: No Electronic Signature(s) Signed: 10/04/2020 3:17:23 PM By: Gretta Cool, BSN, RN, CWS, Kim RN, BSN Entered By: Gretta Cool, BSN, RN, CWS, Kim on 10/04/2020 15:17:23 Petersen, Faron ([id number]) -------------------------------------------------------------------------------- Wound Assessment Details Patient Name: Ryan Day Date of Service: 10/04/2020 12:45 PM Medical Record Number: [id number] Patient Account Number: [id number] Date of Birth/Sex: [birth year] (69 y.o. M) Treating RN: Ryan Day Primary Care Oland Arquette: Ryan Day Other Clinician: Referring Ac Colan: Ryan Day Treating Zayveon Raschke/Extender: Ryan Day Weeks in Treatment: 0 Wound Status Wound Number: 6 Primary Etiology: Pressure Ulcer Wound Location: Left Toe Great Wound Status: Open Wounding Event: Pressure Injury Comorbid History: Hypotension, Quadriplegia Date Acquired: 08/18/2020 Weeks Of Treatment: 0 Clustered Wound: No Photos Photo Uploaded By: Ryan Day, Ryan Day on 10/04/2020 16:35:07 Wound Measurements Length: (cm) 2.2 Width: (cm) 1.5 Depth: (cm) 0.1 Area: (  cm) 2.592 Volume: (cm) 0.259 % Reduction in  Area: % Reduction in Volume: Epithelialization: None Tunneling: No Undermining: No Wound Description Classification: Category/Stage II Wound Margin: Flat and Intact Exudate Amount: Medium Exudate Type: Serous Exudate Color: amber Wound Bed Granulation Amount: Large (67-100%) Granulation Quality: Red Necrotic Amount: Small (1-33%) Necrotic Quality: Adherent Slough Treatment Notes Wound #6 (Toe Great) Wound Laterality: Left Cleanser Peri-Wound Care Topical Primary Dressing Hydrofera Blue Ready Transfer Foam, 2.5x2.5 (in/in) Newton, Carrington ([id number]) Discharge Instruction: Apply Hydrofera Blue Ready to wound bed as directed Secondary Dressing Conforming Guaze Roll-Small Discharge Instruction: [place] as directed Secured With Compression Wrap Compression Stockings Add-Ons Electronic Signature(s) Signed: 10/07/2020 10:48:52 AM By: Gretta Cool, BSN, RN, CWS, Kim RN, BSN Entered By: Gretta Cool, BSN, RN, CWS, Kim on 10/04/2020 13:39:49 Seier, Gershom ([id number]) -------------------------------------------------------------------------------- Vitals Details Patient Name: Ryan Day Date of Service: 10/04/2020 12:45 PM Medical Record Number: [id number] Patient Account Number: [id number] Date of Birth/Sex: [birth year] (69 y.o. M) Treating RN: Ryan Day Primary Care Averianna Brugger: Ryan Day Other Clinician: Referring Nelson Noone: Ryan Day Treating Alitzel Cookson/Extender: Ryan Day in Treatment: 0 Vital Signs Time Taken: 13:11 Pulse (bpm): 76 Height (in): 77 Respiratory Rate (breaths/min): 18 Weight (lbs): 240 Blood Pressure (mmHg): 138/85 Body Mass Index (BMI): 28.5 Reference Range: 80 - 120 mg / dl Notes Patient arrives today in wheelchair unable to get height or weight. Unable to obtain temperature. Electronic Signature(s) Signed: 10/07/2020 10:48:52 AM By: Gretta Cool, BSN, RN, CWS, Kim RN, BSN Entered By: Gretta Cool, BSN, RN, CWS, Kim on  10/04/2020 13:13:46

## 2020-10-08 ENCOUNTER — Encounter: Payer: Self-pay | Admitting: Family Medicine

## 2020-10-10 ENCOUNTER — Other Ambulatory Visit: Payer: Self-pay | Admitting: Family Medicine

## 2020-10-10 ENCOUNTER — Telehealth: Payer: Self-pay

## 2020-10-10 ENCOUNTER — Telehealth: Payer: Self-pay | Admitting: Family Medicine

## 2020-10-10 NOTE — Telephone Encounter (Signed)
PICC has been removed. Patient feeling much better. Has appt with [facility] on 10/18/2020 and was seen last week by them as well.

## 2020-10-10 NOTE — Telephone Encounter (Signed)
That medicine stays in his symptoms for 10 days. Refill not appropriate

## 2020-10-10 NOTE — Telephone Encounter (Signed)
Wife [name] calling to ask if Dr Wynetta Emery will refill the azithromycin (ZITHROMAX) 250 MG tablet  She states pt's mucus is still got a little color to it.  It is still a little thick, but not like it was.  She states pt is looking better too.  But she feels a few more days on an abx would do him good.  [place] (N), [place]

## 2020-10-10 NOTE — Telephone Encounter (Signed)
Routing to provider to advise.  

## 2020-10-10 NOTE — Telephone Encounter (Signed)
Patient wife called back and was informed

## 2020-10-10 NOTE — Telephone Encounter (Signed)
Tried calling patient, no answer left message to call back.

## 2020-10-12 NOTE — Telephone Encounter (Signed)
Called linda verified that tonya got letter via [employer] she states that she did

## 2020-10-17 ENCOUNTER — Telehealth: Payer: Self-pay

## 2020-10-17 NOTE — Telephone Encounter (Signed)
Copied from [place] [id number]. Topic: General - Other >> Oct 17, 2020  1:26 PM Ryan Day wrote: PT need a callback from a nurse / possible UTI / please advise   LVM to make apt.

## 2020-10-17 NOTE — Telephone Encounter (Signed)
Copied from [place] [id number]. Topic: Appointment Scheduling - Scheduling Inquiry for Clinic >> Oct 17, 2020  1:53 PM Greggory Keen D wrote: Reason for CRM: pt has a possible UTI  he can not come in because he is immobile.  I tried to make a virtual phone visit but the first appt is Monday    CB#  [phone]   FYI Pt scheduled for 10/18/2020 will come today to  Get Urine specimen cup and has virtual with Dr.Rumball for tomorrow.

## 2020-10-18 ENCOUNTER — Other Ambulatory Visit: Payer: BC Managed Care – PPO

## 2020-10-18 ENCOUNTER — Other Ambulatory Visit: Payer: Self-pay

## 2020-10-18 ENCOUNTER — Telehealth: Payer: BC Managed Care – PPO | Admitting: Family Medicine

## 2020-10-18 ENCOUNTER — Encounter: Payer: BC Managed Care – PPO | Admitting: Internal Medicine

## 2020-10-18 DIAGNOSIS — L89154 Pressure ulcer of sacral region, stage 4: Secondary | ICD-10-CM | POA: Diagnosis not present

## 2020-10-18 DIAGNOSIS — R8281 Pyuria: Secondary | ICD-10-CM

## 2020-10-18 DIAGNOSIS — R309 Painful micturition, unspecified: Secondary | ICD-10-CM

## 2020-10-18 LAB — URINALYSIS, ROUTINE W REFLEX MICROSCOPIC
Bilirubin, UA: NEGATIVE
Glucose, UA: NEGATIVE
Ketones, UA: NEGATIVE
Nitrite, UA: POSITIVE — AB
Specific Gravity, UA: 1.015 (ref 1.005–1.030)
Urobilinogen, Ur: 0.2 mg/dL (ref 0.2–1.0)
pH, UA: 7 (ref 5.0–7.5)

## 2020-10-18 LAB — MICROSCOPIC EXAMINATION

## 2020-10-18 NOTE — Progress Notes (Deleted)
Virtual Visit via Video Note  I connected with [name] on 10/18/20 at  3:00 PM EST by a video enabled telemedicine application and verified that I am speaking with the correct person using two identifiers.  Location: Patient: *** Provider: ***   I discussed the limitations of evaluation and management by telemedicine and the availability of in person appointments. The patient expressed understanding and agreed to proceed.  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 - wears diapers*** - prior culture with pseudomonas, ecoli  Dysuria: {Blank single:19197::"yes","no","burning"} Urinary frequency: {Blank single:19197::"yes","no"} Urgency: {Blank single:19197::"yes","no"} Small volume voids: {Blank single:19197::"yes","no"} Symptom severity: {Blank single:19197::"yes","no"} Urinary incontinence: {Blank single:19197::"yes","no"} Foul odor: {Blank single:19197::"yes","no"} Hematuria: {Blank single:19197::"yes","no"} Abdominal pain: {Blank single:19197::"yes","no"} Back pain: {Blank single:19197::"yes","no"} Suprapubic pain/pressure: {Blank single:19197::"yes","no"} Flank pain: {Blank single:19197::"yes","no"} Fever:  {Blank multiple:19196::"yes","no","subjective","low grade"} Vomiting: {Blank single:19197::"yes","no"} Relief with cranberry juice: {Blank single:19197::"yes","no"} Relief with pyridium: {Blank single:19197::"yes","no"} Status: better/worse/stable Previous urinary tract infection: yes Recurrent urinary tract infection: yes, 05/2020, 06/2020 Sexual activity: Not sexually active History of sexually transmitted disease: {Blank single:19197::"yes","no"} Penile discharge: {Blank single:19197::"yes","no"} Treatments attempted: {Blank multiple:19196::"none","antibiotics","pyridium","cranberry","increasing fluids"}      Observations/Objective:   Assessment and Plan:   Follow Up Instructions:    I discussed the assessment and treatment plan with the patient. The patient was provided an opportunity to ask questions and all were answered. The patient agreed with the plan and demonstrated an understanding of the instructions.   The patient was advised to call back or seek an in-person evaluation if the symptoms worsen or if the condition fails to improve as anticipated.  I provided *** minutes of non-face-to-face time during this encounter.   Myles Gip, DO

## 2020-10-19 ENCOUNTER — Telehealth: Payer: Self-pay | Admitting: Family Medicine

## 2020-10-19 ENCOUNTER — Other Ambulatory Visit: Payer: Self-pay | Admitting: Family Medicine

## 2020-10-19 MED ORDER — CIPROFLOXACIN HCL 500 MG PO TABS: 500.0000 mg | ORAL_TABLET | Freq: Two times a day (BID) | ORAL | 0 refills | Status: AC

## 2020-10-19 NOTE — Progress Notes (Signed)
BLANCHARD, WILLHITE ([id number]) Visit Report for 10/18/2020 Arrival Information Details Patient Name: Ryan Day, Ryan Day Date of Service: 10/18/2020 11:00 AM Medical Record Number: [id number] Patient Account Number: [id number] Date of Birth/Sex: 01/21/1951 (70 y.o. M) Treating RN: Carlene Coria Primary Care Shaterria Sager: Park Liter Other Clinician: Referring Jossue Rubenstein: Park Liter Treating Shigeru Lampert/Extender: Tito Dine in Treatment: 2 Visit Information History Since Last Visit All ordered tests and consults were completed: No Patient Arrived: Stretcher Added or deleted any medications: No Arrival Time: 11:41 Any new allergies or adverse reactions: No Accompanied By: daughters Had a fall or experienced change in No Transfer Assistance: None activities of daily living that may affect Patient Identification Verified: Yes risk of falls: Secondary Verification Process Completed: Yes Signs or symptoms of abuse/neglect since last visito No Patient Requires Transmission-Based Precautions: No Hospitalized since last visit: No Patient Has Alerts: Yes Implantable device outside of the clinic excluding No Patient Alerts: xalerto cellular tissue based products placed in the center NOT DIABETIC since last visit: Has Dressing in Place as Prescribed: Yes Pain Present Now: No Electronic Signature(s) Signed: 10/19/2020 11:33:31 AM By: Carlene Coria RN Entered By: Carlene Coria on 10/18/2020 11:42:37 [place], [place] ([id number]) -------------------------------------------------------------------------------- Clinic Level of Care Assessment Details Patient Name: Ryan Day Date of Service: 10/18/2020 11:00 AM Medical Record Number: [id number] Patient Account Number: [id number] Date of Birth/Sex: 02/28/1951 (69 y.o. M) Treating RN: Carlene Coria Primary Care Tracker Mance: Park Liter Other Clinician: Referring Dwon Sky: Park Liter Treating Calia Napp/Extender: Tito Dine in Treatment:  2 Clinic Level of Care Assessment Items TOOL 1 Quantity Score [] - Use when EandM and Procedure is performed on INITIAL visit 0 ASSESSMENTS - Nursing Assessment / Reassessment [] - General Physical Exam (combine w/ comprehensive assessment (listed just below) when performed on new 0 pt. evals) [] - 0 Comprehensive Assessment (HX, ROS, Risk Assessments, Wounds Hx, etc.) ASSESSMENTS - Wound and Skin Assessment / Reassessment [] - Dermatologic / Skin Assessment (not related to wound area) 0 ASSESSMENTS - Ostomy and/or Continence Assessment and Care [] - Incontinence Assessment and Management 0 [] - 0 Ostomy Care Assessment and Management (repouching, etc.) PROCESS - Coordination of Care [] - Simple Patient / Family Education for ongoing care 0 [] - 0 Complex (extensive) Patient / Family Education for ongoing care [] - 0 Staff obtains Programmer, systems, Records, Test Results / Process Orders [] - 0 Staff telephones HHA, Nursing Homes / Clarify orders / etc [] - 0 Routine Transfer to another Facility (non-emergent condition) [] - 0 Routine Hospital Admission (non-emergent condition) [] - 0 New Admissions / Biomedical engineer / Ordering NPWT, Apligraf, etc. [] - 0 Emergency Hospital Admission (emergent condition) PROCESS - Special Needs [] - Pediatric / Minor Patient Management 0 [] - 0 Isolation Patient Management [] - 0 Hearing / Language / Visual special needs [] - 0 Assessment of Community assistance (transportation, D/C planning, etc.) [] - 0 Additional assistance / Altered mentation [] - 0 Support Surface(s) Assessment (bed, cushion, seat, etc.) INTERVENTIONS - Miscellaneous [] - External ear exam 0 [] - 0 Patient Transfer (multiple staff / Civil Service fast streamer / Similar devices) [] - 0 Simple Staple / Suture removal (25 or less) [] - 0 Complex Staple / Suture removal (26 or more) [] - 0 Hypo/Hyperglycemic Management (do not check if billed separately) [] - 0 Ankle /  Brachial Index (ABI) - do not check if billed separately Has the patient been seen at the hospital within the last three years: Yes Total  Score: 0 Level Of Care: ____ Ryan Day ([id number]) Electronic Signature(s) Signed: 10/19/2020 11:33:31 AM By: Carlene Coria RN Entered By: Carlene Coria on 10/18/2020 12:06:32 Zannie, Runkle Kaeleb ([id number]) -------------------------------------------------------------------------------- Encounter Discharge Information Details Patient Name: Ryan Day Date of Service: 10/18/2020 11:00 AM Medical Record Number: [id number] Patient Account Number: [id number] Date of Birth/Sex: 02-21-1951 (69 y.o. M) Treating RN: Carlene Coria Primary Care Jahleah Mariscal: Park Liter Other Clinician: Referring Trinty Marken: Park Liter Treating Aydia Maj/Extender: Tito Dine in Treatment: 2 Encounter Discharge Information Items Discharge Condition: Stable Ambulatory Status: Stretcher Discharge Destination: Home Transportation: Ambulance Accompanied By: EMT Schedule Follow-up Appointment: Yes Clinical Summary of Care: Patient Declined Electronic Signature(s) Signed: 10/19/2020 11:33:31 AM By: Carlene Coria RN Entered By: Carlene Coria on 10/18/2020 12:19:57 [place], [place] ([id number]) -------------------------------------------------------------------------------- Lower Extremity Assessment Details Patient Name: Ryan Day Date of Service: 10/18/2020 11:00 AM Medical Record Number: [id number] Patient Account Number: [id number] Date of Birth/Sex: 21-Feb-1951 (69 y.o. M) Treating RN: Carlene Coria Primary Care Brigett Estell: Park Liter Other Clinician: Referring Zamere Pasternak: Park Liter Treating Sarin Comunale/Extender: Tito Dine in Treatment: 2 Edema Assessment Assessed: [Left: No] [Right: No] [Left: Edema] [Right: :] Calf Left: Right: Point of Measurement: 48 cm From Medial Instep 35 cm 35 cm Ankle Left: Right: Point of Measurement: 12 cm From Medial Instep  25 cm 23 cm Vascular Assessment Pulses: Dorsalis Pedis Palpable: [Left:No] [Right:No] Doppler Audible: [Left:Yes] [Right:Yes] Posterior Tibial Palpable: [Left:No] [Right:No] Doppler Audible: [Left:Yes] [Right:Yes] Blood Pressure: Brachial: [Right:178] Ankle: [Left:Dorsalis Pedis: 180 1.01] [Right:Dorsalis Pedis: 198 1.11] Electronic Signature(s) Signed: 10/19/2020 11:33:31 AM By: Carlene Coria RN Entered By: Carlene Coria on 10/18/2020 12:36:38 [place], [place] ([id number]) -------------------------------------------------------------------------------- Multi Wound Chart Details Patient Name: Ryan Day Date of Service: 10/18/2020 11:00 AM Medical Record Number: [id number] Patient Account Number: [id number] Date of Birth/Sex: 11-Mar-1951 (69 y.o. M) Treating RN: Carlene Coria Primary Care Valena Ivanov: Park Liter Other Clinician: Referring Larya Charpentier: Park Liter Treating Harmoney Sienkiewicz/Extender: Tito Dine in Treatment: 2 Vital Signs Height(in): 77 Pulse(bpm): 75 Weight(lbs): 240 Blood Pressure(mmHg): 141/86 Body Mass Index(BMI): 28 Temperature(F): 97.6 Respiratory Rate(breaths/min): 18 Photos: [3:No Photos] Wound Location: Right Gluteus Sacrum Right Calcaneus Wounding Event: Pressure Injury Pressure Injury Pressure Injury Primary Etiology: Pressure Ulcer Pressure Ulcer Pressure Ulcer Comorbid History: Hypotension, Quadriplegia Hypotension, Quadriplegia N/A Date Acquired: 06/23/2020 06/23/2020 08/18/2020 Weeks of Treatment: _0 Wound Status: Open Open Open Measurements L x W x D (cm) 0.7x1.1x0.1 3x1.5x2.9 5.5x7.3x0.2 Area (cm) : 0.605 3.534 31.534 Volume (cm) : 0.06 10.249 6.307 % Reduction in Area: 56.20% 12.10% -6595.10% % Reduction in Volume: 56.50% 27.20% -13319.10% Starting Position 1 (o'clock): 12 Ending Position 1 (o'clock): 12 Maximum Distance 1 (cm): 3.1 Undermining: No Yes N/A Classification: Category/Stage II Category/Stage IV Category/Stage  II Exudate Amount: Medium Medium N/A Exudate Type: Serosanguineous Serous N/A Exudate Color: red, brown amber N/A Wound Margin: N/A Flat and Intact N/A Granulation Amount: Large (67-100%) Large (67-100%) N/A Granulation Quality: Red, Pink N/A N/A Necrotic Amount: Small (1-33%) Small (1-33%) N/A Necrotic Tissue: Adherent [place]on N/A Exposed Structures: Fat Layer (Subcutaneous Tissue): Fat Layer (Subcutaneous Tissue): N/A Yes Yes Fascia: No Fascia: No Tendon: No Tendon: No Muscle: No Muscle: No Joint: No Joint: No Bone: No Bone: No Epithelialization: None None N/A Procedures Performed: N/A Negative Pressure Wound Therapy N/A Maintenance (NPWT) Wound Number: 4 6 N/A Photos: N/A CICHY, Hendrix ([id number]) Wound Location: Left Calcaneus Left Toe Great N/A Wounding Event: Pressure Injury Pressure Injury N/A Primary Etiology: Pressure Ulcer Pressure Ulcer N/A Comorbid History: Hypotension, Quadriplegia  Hypotension, Quadriplegia N/A Date Acquired: 08/18/2020 08/18/2020 N/A Weeks of Treatment: 2 2 N/A Wound Status: Open Open N/A Measurements L x W x D (cm) 6.5x6x0.1 3.2x1x0.1 N/A Area (cm) : 30.631 2.513 N/A Volume (cm) : 3.063 0.251 N/A % Reduction in Area: -95.00% 3.00% N/A % Reduction in Volume: -95.00% 3.10% N/A Undermining: No No N/A Classification: Category/Stage II Category/Stage II N/A Exudate Amount: Medium Medium N/A Exudate Type: Serous Serous N/A Exudate Color: amber amber N/A Wound Margin: Flat and Intact Flat and Intact N/A Granulation Amount: None Present (0%) Large (67-100%) N/A Granulation Quality: N/A Red N/A Necrotic Amount: Large (67-100%) Small (1-33%) N/A Necrotic Tissue: Eschar Adherent Slough N/A Exposed Structures: Fat Layer (Subcutaneous Tissue): Fat Layer (Subcutaneous Tissue): N/A Yes Yes Fascia: No Fascia: No Tendon: No Tendon: No Muscle: No Muscle: No Joint: No Joint: No Bone: No Bone: No Epithelialization: None  None N/A Procedures Performed: N/A N/A N/A Treatment Notes Wound #1 (Gluteus) Wound Laterality: Right Cleanser Byram Ancillary Kit - 15 Day Supply Discharge Instruction: Use supplies as instructed; Kit contains: (15) Saline Bullets; (15) 3x3 Gauze; 15 pr Gloves Peri-Wound Care Topical Primary Dressing Promogran Matrix 4.34 (in) Discharge Instruction: Moisten w/normal saline or sterile water; Cover wound as directed. Do not remove from wound bed. Secondary Dressing Mepilex Border Flex, 4x4 (in/in) Discharge Instruction: Apply to wound as directed. Do not cut. Secured With Compression Wrap Compression Stockings Add-Ons Wound #2 (Sacrum) Cleanser Peri-Wound Care Quevedo, Iori ([id number]) Topical Primary Dressing Promogran Matrix 4.34 (in) Discharge Instruction: Moisten w/normal saline or sterile water; Cover wound as directed. Do not remove from wound bed. Secondary Dressing Secured With Compression Wrap Compression Stockings Add-Ons Wound #3 (Calcaneus) Wound Laterality: Right Cleanser Peri-Wound Care Topical Primary Dressing Hydrofera Blue Ready Transfer Foam, 4x5 (in/in) Discharge Instruction: Apply Hydrofera Blue Ready to wound bed as directed Secondary Dressing Kerlix 4.5 x 4.1 (in/yd) Discharge Instruction: Apply Kerlix 4.5 x 4.1 (in/yd) as instructed Secured With Compression Wrap Compression Stockings Add-Ons ALLEVYN Heel 4 1/2in x 5 1/2in / 10.5cm x 13.5cm Discharge Instruction: Apply the white, patterned surface to the heel. Wound #4 (Calcaneus) Wound Laterality: Left Cleanser Peri-Wound Care Topical Primary Dressing Hydrofera Blue Ready Transfer Foam, 4x5 (in/in) Discharge Instruction: Apply Hydrofera Blue Ready to wound bed as directed Secondary Dressing Kerlix 4.5 x 4.1 (in/yd) Discharge Instruction: Apply Kerlix 4.5 x 4.1 (in/yd) as instructed Secured With Compression Wrap Compression Stockings Add-Ons ALLEVYN Heel 4 1/2in x 5 1/2in / 10.5cm x  13.5cm Discharge Instruction: Apply the white, patterned surface to the heel. Stock, Eligah ([id number]) Wound #6 (Toe Great) Wound Laterality: Left Cleanser Peri-Wound Care Topical Primary Dressing Hydrofera Blue Ready Transfer Foam, 2.5x2.5 (in/in) Discharge Instruction: Apply Hydrofera Blue Ready to wound bed as directed Secondary Dressing Conforming Guaze Roll-Small Discharge Instruction: Apply Conforming Stretch Guaze Bandage as directed Secured With Compression Wrap Compression Stockings Add-Ons Electronic Signature(s) Signed: 10/19/2020 11:56:06 AM By: Robson, Michael MD Entered By: Robson, Michael on 10/18/2020 12:38:26 Palos, Jamarious ([id number]) -------------------------------------------------------------------------------- Multi-Disciplinary Care Plan Details Patient Name: Sahli, Dinari Date of Service: 10/18/2020 11:00 AM Medical Record Number: [id number] Patient Account Number: [id number] Date of Birth/Sex: 08/21/1951 (69 y.o. M) Treating RN: Epps, Carrie Primary Care Provider: Johnson, Megan Other Clinician: Referring Provider: Johnson, Megan Treating Provider/Extender: ROBSON, MICHAEL G Weeks in Treatment: 2 Active Inactive Wound/Skin Impairment Nursing Diagnoses: Impaired tissue integrity Goals: Patient/caregiver will verbalize understanding of skin care regimen Date Initiated: 10/04/2020 Target Resolution Date: 11/04/2020 Goal Status: Active Ulcer/skin breakdown will have a volume reduction   of 30% by week 4 Date Initiated: 10/04/2020 Target Resolution Date: 11/04/2020 Goal Status: Active Interventions: Assess ulceration(s) every visit Treatment Activities: Skin care regimen initiated : 10/04/2020 Topical wound management initiated : 10/04/2020 Notes: Electronic Signature(s) Signed: 10/19/2020 11:33:31 AM By: Carlene Coria RN Entered By: Carlene Coria on 10/18/2020 12:01:21 Giza, Orvis  ([id number]) -------------------------------------------------------------------------------- Negative Pressure Wound Therapy Maintenance (NPWT) Details Patient Name: PRESLEY, SUMMERLIN Date of Service: 10/18/2020 11:00 AM Medical Record Number: [id number] Patient Account Number: [id number] Date of Birth/Sex: 09-19-1951 (70 y.o. M) Treating RN: Carlene Coria Primary Care Diesel Lina: Park Liter Other Clinician: Referring Krissy Orebaugh: Park Liter Treating Rebbeca Sheperd/Extender: Ricard Dillon Weeks in Treatment: 2 NPWT Maintenance Performed for: Wound #2 Sacrum Additional Injuries Covered: No Performed By: Carlene Coria, RN Type: Other Coverage Size (sq cm): 4.5 Pressure Type: Constant Pressure Setting: 125 mmHG Drain Type: None Sponge/Dressing Type: Foam, Black Date Initiated: 10/04/2020 Dressing Removed: No Quantity of Sponges/Gauze Removed: 1 Canister Changed: No Dressing Reapplied: No Quantity of Sponges/Gauze Inserted: 1 Days On NPWT: 15 Post Procedure Diagnosis Same as Pre-procedure Electronic Signature(s) Signed: 10/19/2020 11:33:31 AM By: Carlene Coria RN Entered By: Carlene Coria on 10/18/2020 12:05:34 Deitrick, Visente ([id number]) -------------------------------------------------------------------------------- Pain Assessment Details Patient Name: Ryan Day Date of Service: 10/18/2020 11:00 AM Medical Record Number: [id number] Patient Account Number: [id number] Date of Birth/Sex: 02-10-1951 (69 y.o. M) Treating RN: Carlene Coria Primary Care Ryker Pherigo: Park Liter Other Clinician: Referring Yamileth Hayse: Park Liter Treating Ricki Vanhandel/Extender: Tito Dine in Treatment: 2 Active Problems Location of Pain Severity and Description of Pain Patient Has Paino No Site Locations Pain Management and Medication Current Pain Management: Electronic Signature(s) Signed: 10/19/2020 11:33:31 AM By: Carlene Coria RN Entered By: Carlene Coria on 10/18/2020 11:44:16 Bardales, Aveon  ([id number]) -------------------------------------------------------------------------------- Patient/Caregiver Education Details Patient Name: Ryan Day Date of Service: 10/18/2020 11:00 AM Medical Record Number: [id number] Patient Account Number: [id number] Date of Birth/Gender: 04-03-1951 (69 y.o. M) Treating RN: Carlene Coria Primary Care Physician: Park Liter Other Clinician: Referring Physician: Park Liter Treating Physician/Extender: Tito Dine in Treatment: 2 Education Assessment Education Provided To: Patient Education Topics Provided Wound/Skin Impairment: Methods: Explain/Verbal Responses: State content correctly Electronic Signature(s) Signed: 10/19/2020 11:33:31 AM By: Carlene Coria RN Entered By: Carlene Coria on 10/18/2020 12:07:37 Fischman, Harvis ([id number]) -------------------------------------------------------------------------------- Wound Assessment Details Patient Name: Ryan Day Date of Service: 10/18/2020 11:00 AM Medical Record Number: [id number] Patient Account Number: [id number] Date of Birth/Sex: 1951-06-09 (69 y.o. M) Treating RN: Carlene Coria Primary Care Jerene Yeager: Park Liter Other Clinician: Referring Warden Buffa: Park Liter Treating Caren Garske/Extender: Tito Dine in Treatment: 2 Wound Status Wound Number: 1 Primary Etiology: Pressure Ulcer Wound Location: Right Gluteus Wound Status: Open Wounding Event: Pressure Injury Comorbid History: Hypotension, Quadriplegia Date Acquired: 06/23/2020 Weeks Of Treatment: 2 Clustered Wound: No Photos Wound Measurements Length: (cm) 0.7 Width: (cm) 1.1 Depth: (cm) 0.1 Area: (cm) 0.605 Volume: (cm) 0.06 % Reduction in Area: 56.2% % Reduction in Volume: 56.5% Epithelialization: None Tunneling: No Undermining: No Wound Description Classification: Category/Stage II Exudate Amount: Medium Exudate Type: Serosanguineous Exudate Color: red, brown Foul Odor After  Cleansing: No Slough/Fibrino Yes Wound Bed Granulation Amount: Large (67-100%) Exposed Structure Granulation Quality: Red, Pink Fascia Exposed: No Necrotic Amount: Small (1-33%) Fat Layer (Subcutaneous Tissue) Exposed: Yes Necrotic Quality: Adherent Slough Tendon Exposed: No Muscle Exposed: No Joint Exposed: No Bone Exposed: No Treatment Notes Wound #1 (Gluteus) Wound Laterality: Right Cleanser Byram Ancillary Kit - 15 Day Supply Discharge Instruction: Use supplies as instructed; Kit contains: (15) Saline  Bullets; (15) 3x3 Gauze; 15 pr Gloves Peri-Wound Care Boggus, Ewen ([id number]) Topical Primary Dressing Promogran Matrix 4.34 (in) Discharge Instruction: Moisten w/normal saline or sterile water; Cover wound as directed. Do not remove from wound bed. Secondary Dressing Mepilex Border Flex, 4x4 (in/in) Discharge Instruction: Apply to wound as directed. Do not cut. Secured With Compression Wrap Compression Stockings Environmental education officer) Signed: 10/19/2020 11:33:31 AM By: Carlene Coria RN Entered By: Carlene Coria on 10/18/2020 11:50:22 Gavilanes, Dex ([id number]) -------------------------------------------------------------------------------- Wound Assessment Details Patient Name: Ryan Day Date of Service: 10/18/2020 11:00 AM Medical Record Number: [id number] Patient Account Number: [id number] Date of Birth/Sex: 1951/09/11 (69 y.o. M) Treating RN: Carlene Coria Primary Care Ahliya Glatt: Park Liter Other Clinician: Referring Jhene Westmoreland: Park Liter Treating Taylin Mans/Extender: Tito Dine in Treatment: 2 Wound Status Wound Number: 2 Primary Etiology: Pressure Ulcer Wound Location: Sacrum Wound Status: Open Wounding Event: Pressure Injury Comorbid History: Hypotension, Quadriplegia Date Acquired: 06/23/2020 Weeks Of Treatment: 2 Clustered Wound: No Photos Wound Measurements Length: (cm) 3 % Redu Width: (cm) 1.5 % Redu Depth: (cm) 2.9  Epithe Area: (cm) 3.534 Tunne Volume: (cm) 10.249 Under Sta End Max ction in Area: 12.1% ction in Volume: 27.2% lialization: None ling: No mining: Yes rting Position (o'clock): 12 ing Position (o'clock): 12 imum Distance: (cm) 3.1 Wound Description Classification: Category/Stage IV Foul O Wound Margin: Flat and Intact Slough Exudate Amount: Medium Exudate Type: Serous Exudate Color: amber dor After Cleansing: No /Fibrino Yes Wound Bed Granulation Amount: Large (67-100%) Exposed Structure Necrotic Amount: Small (1-33%) Fascia Exposed: No Necrotic Quality: Adherent Slough Fat Layer (Subcutaneous Tissue) Exposed: Yes Tendon Exposed: No Muscle Exposed: No Joint Exposed: No Bone Exposed: No Treatment Notes Wound #2 (Sacrum) Cleanser Doenges, Elza ([id number]) Peri-Wound Care Topical Primary Dressing Promogran Matrix 4.34 (in) Discharge Instruction: Moisten w/normal saline or sterile water; Cover wound as directed. Do not remove from wound bed. Secondary Dressing Secured With Compression Wrap Compression Stockings Add-Ons Electronic Signature(s) Signed: 10/19/2020 11:33:31 AM By: Carlene Coria RN Entered By: Carlene Coria on 10/18/2020 11:55:33 Zayas, Erik ([id number]) -------------------------------------------------------------------------------- Wound Assessment Details Patient Name: Ryan Day Date of Service: 10/18/2020 11:00 AM Medical Record Number: [id number] Patient Account Number: [id number] Date of Birth/Sex: 08-25-1951 (69 y.o. M) Treating RN: Carlene Coria Primary Care Lindsay Straka: Park Liter Other Clinician: Referring Dima Ferrufino: Park Liter Treating Saydie Gerdts/Extender: Tito Dine in Treatment: 2 Wound Status Wound Number: 3 Primary Etiology: Pressure Ulcer Wound Location: Right Calcaneus Wound Status: Open Wounding Event: Pressure Injury Date Acquired: 08/18/2020 Weeks Of Treatment: 2 Clustered Wound: No Wound  Measurements Length: (cm) 5.5 Width: (cm) 7.3 Depth: (cm) 0.2 Area: (cm) 31.534 Volume: (cm) 6.307 % Reduction in Area: -6595.1% % Reduction in Volume: -13319.1% Wound Description Classification: Category/Stage II Treatment Notes Wound #3 (Calcaneus) Wound Laterality: Right Cleanser Peri-Wound Care Topical Primary Dressing Hydrofera Blue Ready Transfer Foam, 4x5 (in/in) Discharge Instruction: Apply Hydrofera Blue Ready to wound bed as directed Secondary Dressing Kerlix 4.5 x 4.1 (in/yd) Discharge Instruction: Apply Kerlix 4.5 x 4.1 (in/yd) as instructed Secured With Compression Wrap Compression Stockings Add-Ons ALLEVYN Heel 4 1/2in x 5 1/2in / 10.5cm x 13.5cm Discharge Instruction: Apply the white, patterned surface to the heel. Electronic Signature(s) Signed: 10/19/2020 11:33:31 AM By: Carlene Coria RN Entered By: Carlene Coria on 10/18/2020 11:47:01 Krejci, Konner ([id number]) -------------------------------------------------------------------------------- Wound Assessment Details Patient Name: Ryan Day Date of Service: 10/18/2020 11:00 AM Medical Record Number: [id number] Patient Account Number: [id number] Date of Birth/Sex: 14-Sep-1951 (69 y.o. M) Treating RN: Carlene Coria Primary Care  Provider: Johnson, Megan Other Clinician: Referring Provider: Johnson, Megan Treating Provider/Extender: ROBSON, MICHAEL G Weeks in Treatment: 2 Wound Status Wound Number: 4 Primary Etiology: Pressure Ulcer Wound Location: Left Calcaneus Wound Status: Open Wounding Event: Pressure Injury Comorbid History: Hypotension, Quadriplegia Date Acquired: 08/18/2020 Weeks Of Treatment: 2 Clustered Wound: No Photos Wound Measurements Length: (cm) 6.5 % Redu Width: (cm) 6 % Redu Depth: (cm) 0.1 Epithe Area: (cm) 30.631 Tunne Volume: (cm) 3.063 Under ction in Area: -95% ction in Volume: -95% lialization: None ling: No mining: No Wound Description Classification: Category/Stage II  Foul O Wound Margin: Flat and Intact Slough Exudate Amount: Medium Exudate Type: Serous Exudate Color: amber dor After Cleansing: No /Fibrino Yes Wound Bed Granulation Amount: None Present (0%) Exposed Structure Necrotic Amount: Large (67-100%) Fascia Exposed: No Necrotic Quality: Eschar Fat Layer (Subcutaneous Tissue) Exposed: Yes Tendon Exposed: No Muscle Exposed: No Joint Exposed: No Bone Exposed: No Treatment Notes Wound #4 (Calcaneus) Wound Laterality: Left Cleanser Peri-Wound Care Topical Rosenow, Montie ([id number]) Primary Dressing Hydrofera Blue Ready Transfer Foam, 4x5 (in/in) Discharge Instruction: Apply Hydrofera Blue Ready to wound bed as directed Secondary Dressing Kerlix 4.5 x 4.1 (in/yd) Discharge Instruction: Apply Kerlix 4.5 x 4.1 (in/yd) as instructed Secured With Compression Wrap Compression Stockings Add-Ons ALLEVYN Heel 4 1/2in x 5 1/2in / 10.5cm x 13.5cm Discharge Instruction: Apply the white, patterned surface to the heel. Electronic Signature(s) Signed: 10/19/2020 11:33:31 AM By: Epps, Carrie RN Entered By: Epps, Carrie on 10/18/2020 11:53:17 Murton, Joyce ([id number]) -------------------------------------------------------------------------------- Wound Assessment Details Patient Name: Privott, Kamarri Date of Service: 10/18/2020 11:00 AM Medical Record Number: [id number] Patient Account Number: [id number] Date of Birth/Sex: 02/10/1951 (69 y.o. M) Treating RN: Epps, Carrie Primary Care Provider: Johnson, Megan Other Clinician: Referring Provider: Johnson, Megan Treating Provider/Extender: ROBSON, MICHAEL G Weeks in Treatment: 2 Wound Status Wound Number: 6 Primary Etiology: Pressure Ulcer Wound Location: Left Toe Great Wound Status: Open Wounding Event: Pressure Injury Comorbid History: Hypotension, Quadriplegia Date Acquired: 08/18/2020 Weeks Of Treatment: 2 Clustered Wound: No Photos Wound Measurements Length: (cm) 3.2 % Re Width: (cm) 1 %  Re Depth: (cm) 0.1 Epit Area: (cm) 2.513 Tun Volume: (cm) 0.251 Und duction in Area: 3% duction in Volume: 3.1% helialization: None neling: No ermining: No Wound Description Classification: Category/Stage II Foul Wound Margin: Flat and Intact Slou Exudate Amount: Medium Exudate Type: Serous Exudate Color: amber Odor After Cleansing: No gh/Fibrino Yes Wound Bed Granulation Amount: Large (67-100%) Exposed Structure Granulation Quality: Red Fascia Exposed: No Necrotic Amount: Small (1-33%) Fat Layer (Subcutaneous Tissue) Exposed: Yes Necrotic Quality: Adherent Slough Tendon Exposed: No Muscle Exposed: No Joint Exposed: No Bone Exposed: No Treatment Notes Wound #6 (Toe Great) Wound Laterality: Left Cleanser Peri-Wound Care Topical Veale, Neshawn ([id number]) Primary Dressing Hydrofera Blue Ready Transfer Foam, 2.5x2.5 (in/in) Discharge Instruction: Apply Hydrofera Blue Ready to wound bed as directed Secondary Dressing Conforming Guaze Roll-Small Discharge Instruction: Apply Conforming Stretch Guaze Bandage as directed Secured With Compression Wrap Compression Stockings Add-Ons Electronic Signature(s) Signed: 10/19/2020 11:33:31 AM By: Epps, Carrie RN Entered By: Epps, Carrie on 10/18/2020 11:53:51 Mcomber, Sriansh ([id number]) -------------------------------------------------------------------------------- Vitals Details Patient Name: Yordy, Danney Date of Service: 10/18/2020 11:00 AM Medical Record Number: [id number] Patient Account Number: [id number] Date of Birth/Sex: 10/14/1950 (69 y.o. M) Treating RN: Epps, Carrie Primary Care Provider: Johnson, Megan Other Clinician: Referring Provider: Johnson, Megan Treating Provider/Extender: ROBSON, MICHAEL G Weeks in Treatment: 2 Vital Signs Time Taken: 11:43 Temperature (°F): 97.6 Height (in): 77 Pulse (bpm): 75 Weight (lbs): 240 Respiratory Rate (  breaths/min): 18 Body Mass Index (BMI): 28.5 Blood Pressure (mmHg):  141/86 Reference Range: 80 - 120 mg / dl Electronic Signature(s) Signed: 10/19/2020 11:33:31 AM By: Epps, Carrie RN Entered By: Epps, Carrie on 10/18/2020 11:43:45 

## 2020-10-19 NOTE — Telephone Encounter (Signed)
Pt scheduled for 10/23/2020 as soonest available apt the patient care giver asking if medication can be sent in the mean time.

## 2020-10-19 NOTE — Telephone Encounter (Signed)
Antibiotic sent based on prior urine culture results. We will follow pending urine culture and alert family if antibiotic needs changing. They should keep appointment for Monday.

## 2020-10-19 NOTE — Progress Notes (Addendum)
MAKAIO, MACH ([id number]) Visit Report for 10/18/2020 HPI Details Patient Name: Ryan Day, Ryan Day Date of Service: 10/18/2020 11:00 AM Medical Record Number: [id number] Patient Account Number: [id number] Date of Birth/Sex: 1951/01/30 (70 y.o. M) Treating RN: Cornell Barman Primary Care Provider: Park Liter Other Clinician: Referring Provider: Park Liter Treating Provider/Extender: Tito Dine in Treatment: 2 History of Present Illness HPI Description: ADMISSION 10/04/2020 [name] is a 70 year old man who is here accompanied by his daughters. 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 He spent a long time in the neuro ICU after his surgical stabilization at [facility]. 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 I do not have much information about the start of this or how it progressed but he was readmitted to [facility] from 08/14/2020 through 08/27/2020 with an infected sacral pressure ulcer. He required a operative debridement by general surgery on 11/24. Wound culture grew Enterococcus and he is just completing 6 weeks of Zosyn under the tutelage of infectious disease. According to his daughters the heel wounds that he has started at that time although I am really not able to get much of a history on these. He came in with small areas on the right heel laterally and 2 on the left. They have been using a wound VAC on the sacrum with a foam cover over the small superficial area on the right buttock under the VAC seal. And I think foam on the heels as well as the great toe on the left. He has a level 2 surface. 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 His CT scan of the pelvis with contrast on 11/23 did show the large left sacral decubitus ulcer with evidence of sacral osteomyelitis. We have regular collagen under wound VAC. He also has a substantial area on the right heel and a more superficial area on the left. I debrided the right heel last week it does not look much better this week. He is definitely going to need ABIs on that side which we will arrange today We are able to document his ABIs today at 1.0 on the right and 1.01 on the left Electronic Signature(s) Signed: 10/19/2020 11:56:06 AM By: Linton Ham MD Entered By: Linton Ham on 10/18/2020 12:44:40 Mcintyre, Zae ([id number]) -------------------------------------------------------------------------------- Physical Exam Details Patient Name: Ryan Day Date of Service: 10/18/2020 11:00 AM Medical Record Number: [id number] Patient Account Number: [id number] Date of Birth/Sex: December 19, 1950 (70 y.o. M) Treating RN: Cornell Barman Primary Care Provider: Park Liter Other Clinician: Referring Provider: Park Liter Treating Provider/Extender: Ricard Dillon Weeks in Treatment: 2 Constitutional Sitting or standing Blood Pressure is within target range for patient.. Pulse regular and within target range for patient.Marland Kitchen Respirations regular, non- labored and within target range.. Temperature is normal and within the target range for the patient.Marland Kitchen appears in no distress. Cardiovascular Pedal pulses were palpable at the posterior tibial although the dorsalis pedis pulses were hard to feel. Notes Wound exam oStage IV wound over the sacrum this undermines substantially from 12-5. What I can see of the granulation looks healthy. No palpable bone no purulence no palpable soft tissue tenderness oSmall stage II on the  right buttock oThe left heel again is eschared. The right is much more superficial oSuperficial area on the tip of the left great  toe Electronic Signature(s) Signed: 10/19/2020 11:56:06 AM By: Linton Ham MD Entered By: Linton Ham on 10/18/2020 12:44:09 Sheppard, Aahil ([id number]) -------------------------------------------------------------------------------- Physician Orders Details Patient Name: Ryan Day Date of Service: 10/18/2020 11:00 AM Medical Record Number: [id number] Patient Account Number: [id number] Date of Birth/Sex: [birth year] (70 y.o. M) Treating RN: Carlene Coria Primary Care Provider: Park Liter Other Clinician: Referring Provider: Park Liter Treating Provider/Extender: Tito Dine in Treatment: 2 Verbal / Phone Orders: No Diagnosis Coding ICD-10 Coding Code Description L89.154 Pressure ulcer of sacral region, stage 4 L89.312 Pressure ulcer of right buttock, stage 2 L89.610 Pressure ulcer of right heel, unstageable B51.025 Pressure ulcer of left heel, stage 2 S14.104D Unspecified injury at C4 level of cervical spinal cord, subsequent encounter Follow-up Appointments o Return Appointment in 2 weeks. [place] for wound care. May utilize formulary equivalent dressing for wound treatment orders unless otherwise specified. Home Health Nurse may visit PRN to address patientos wound care needs. [facility] Primary Wound Dressing Wound #1 Right Gluteus o Collagen - moisten with normal saline Wound #2 Sacrum o Collagen - moisten with normal saline Wound #3 Right Calcaneus o Hydrafera Blue Ready Transfer Wound #4 Left Calcaneus o Hydrafera Blue Ready Transfer Wound #6 Left Toe Great o Hydrafera Blue Ready Transfer Secondary Dressing Wound #1 Right Gluteus o Boardered Foam Dressing Wound #3 Right Calcaneus o Dry Gauze o Other - heel cup Wound #4 Left Calcaneus o Dry Gauze o Other - heel cup Wound #6 Left Toe Great o Dry Gauze Dressing Change Frequency Wound #1 Right Gluteus o Twice Weekly Wound #2  Sacrum o Twice Weekly o Three times weekly Wound #3 Right Calcaneus o Change dressing every other day. [place], Braydon ([id number]) Wound #4 Left Calcaneus o Change dressing every other day. Wound #6 Left Toe Great o Change dressing every other day. Off-Loading o Heel suspension boot o Turn and reposition every 2 hours Negative Pressure Wound Therapy Wound #2 [place] Nurse may d/c VAC for s/s of increased infection, significant wound regression, or uncontrolled drainage. Melv[place] at [phone]. o Other: - Wound VAC settings at 125/130 mmHg continuous pressure. Use foam to wound cavity. Use BLACK foam to fill any tunnel/s and/or undermining. Change VAC dressing 2 X WEEK. Change canister as indicated when full. Nurse may titrate settings and frequency of dressing changes as clinically indicated. Electronic Signature(s) Signed: 10/19/2020 7:05:53 PM By: Linton Ham MD Signed: 10/23/2020 7:56:30 AM By: Carlene Coria RN Entered By: Carlene Coria on 10/19/2020 17:13:48 Conely, Kenwood ([id number]) -------------------------------------------------------------------------------- Problem List Details Patient Name: Ryan Day Date of Service: 10/18/2020 11:00 AM Medical Record Number: [id number] Patient Account Number: [id number] Date of Birth/Sex: [birth year] (69 y.o. M) Treating RN: Cornell Barman Primary Care Provider: Park Liter Other Clinician: Referring Provider: Park Liter Treating Provider/Extender: Tito Dine in Treatment: 2 Active Problems ICD-10 Encounter Code Description Active Date MDM Diagnosis L89.154 Pressure ulcer of sacral region, stage 4 10/04/2020 No Yes L89.312 Pressure ulcer of right buttock, stage 2 10/04/2020 No Yes L89.610 Pressure ulcer of right heel, unstageable 10/04/2020 No Yes L89.622 Pressure ulcer of left heel, stage 2 10/04/2020 No Yes S14.104D Unspecified injury at C4 level of cervical spinal  cord, subsequent 10/04/2020 No Yes encounter Inactive Problems Resolved Problems Electronic Signature(s) Signed: 10/19/2020 11:56:06 AM By: Linton Ham MD  Entered By: Linton Ham on 10/18/2020 12:28:29 Ryan Day ([id number]) -------------------------------------------------------------------------------- Progress Note Details Patient Name: Ryan Day Date of Service: 10/18/2020 11:00 AM Medical Record Number: [id number] Patient Account Number: [id number] Date of Birth/Sex: 1950-12-31 (70 y.o. M) Treating RN: Cornell Barman Primary Care Provider: Park Liter Other Clinician: Referring Provider: Park Liter Treating Provider/Extender: Tito Dine in Treatment: 2 Subjective History of Present Illness (HPI) ADMISSION 10/04/2020 [name] is a 70 year old man who is here accompanied by his daughters. 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 He spent a long time in the neuro ICU after his surgical stabilization at [facility]. 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 I do not have much information about the start of this or how it progressed but he was readmitted to [facility] from 08/14/2020 through 08/27/2020 with an infected sacral pressure ulcer. He required a operative debridement by general surgery on 11/24. Wound culture grew Enterococcus and he is just completing 6 weeks of Zosyn under the tutelage of infectious disease. According to his daughters the heel wounds that he has started at that time although I am really not able to get much of a history on these. He came in with small areas on the right heel laterally and 2 on the left. They have been using a wound VAC on the sacrum with a foam cover over the small superficial area on the right buttock under the VAC seal. And I think foam on the heels as well as the great toe on the left. He has a level 2  surface. 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 His CT scan of the pelvis with contrast on 11/23 did show the large left sacral decubitus ulcer with evidence of sacral osteomyelitis. We have regular collagen under wound VAC. He also has a substantial area on the right heel and a more superficial area on the left. I debrided the right heel last week it does not look much better this week. He is definitely going to need ABIs on that side which we will arrange today We are able to document his ABIs today at 1.0 on the right and 1.01 on the left Objective Constitutional Sitting or standing Blood Pressure is within target range for patient.. Pulse regular and within target range for patient.Marland Kitchen Respirations regular, non- labored and within target range.. Temperature is normal and within the target range for the patient.Marland Kitchen appears in no distress. Vitals Time Taken: 11:43 AM, Height: 77 in, Weight: 240 lbs, BMI: 28.5, Temperature: 97.6 F, Pulse: 75 bpm, Respiratory Rate: 18 breaths/min, Blood Pressure: 141/86 mmHg. Cardiovascular Pedal pulses were palpable at the posterior tibial although the dorsalis pedis pulses were hard to feel. General Notes: Wound exam Stage IV wound over the sacrum this undermines substantially from 12-5. What I can see of the granulation looks healthy. No palpable bone no purulence no palpable soft tissue tenderness Small stage II on the right buttock The left heel again is eschared. The right is much more superficial Superficial area on the tip of the left great toe Integumentary (Hair, Skin) Wound #1 status is Open. Original cause of wound was Pressure Injury.  The wound is located on the Right Gluteus.  The wound measures 0.7cm length x 1.1cm width x 0.1cm depth; 0.605cm^2 area and 0.06cm^3 volume. There is Fat Layer (Subcutaneous Tissue) exposed. There is no tunneling or undermining noted. There is a medium amount of serosanguineous drainage noted. There is large (67-100%) red, pink granulation within the wound bed. There is a small (1-33%) amount of necrotic tissue within the wound bed including Adherent Slough. Wound #2 status is Open. Original cause of wound was Pressure Injury. The wound is located on the Sacrum. The wound measures 3cm length x 1.5cm width x 2.9cm depth; 3.534cm^2 area and 10.249cm^3 volume. There is Fat Layer (Subcutaneous Tissue) exposed. There is no tunneling noted, however, there is undermining starting at 12:00 and ending at 12:00 with a maximum distance of 3.1cm. There is a medium amount of serous drainage noted. The wound margin is flat and intact. There is large (67-100%) granulation within the wound bed. There is a small (1-33%) amount of necrotic tissue within the wound bed including Adherent Slough. Wound #3 status is Open. Original cause of wound was Pressure Injury. The wound is located on the Right Calcaneus. The wound measures Clinch, Kalep ([id number]) 5.5cm length x 7.3cm width x 0.2cm depth; 31.534cm^2 area and 6.307cm^3 volume. Wound #4 status is Open. Original cause of wound was Pressure Injury. The wound is located on the Left Calcaneus. The wound measures 6.5cm length x 6cm width x 0.1cm depth; 30.631cm^2 area and 3.063cm^3 volume. There is Fat Layer (Subcutaneous Tissue) exposed. There is no tunneling or undermining noted. There is a medium amount of serous drainage noted. The wound margin is flat and intact. There is no granulation within the wound bed. There is a large (67-100%) amount of necrotic tissue within the wound bed including Eschar. Wound #6 status is Open. Original cause of wound was Pressure Injury. The wound is located on the Left Toe Great.  The wound measures 3.2cm length x 1cm width x 0.1cm depth; 2.513cm^2 area and 0.251cm^3 volume. There is Fat Layer (Subcutaneous Tissue) exposed. There is no tunneling or undermining noted. There is a medium amount of serous drainage noted. The wound margin is flat and intact. There is large (67- 100%) red granulation within the wound bed. There is a small (1-33%) amount of necrotic tissue within the wound bed including Adherent Slough. Assessment Active Problems ICD-10 Pressure ulcer of sacral region, stage 4 Pressure ulcer of right buttock, stage 2 Pressure ulcer of right heel, unstageable Pressure ulcer of left heel, stage 2 Unspecified injury at C4 level of cervical spinal cord, subsequent encounter Plan Follow-up Appointments: Return Appointment in 2 weeks. Bathing/ Shower/ Hygiene: Clean wound with Normal Saline or wound cleanser. May shower; gently cleanse wound with antibacterial soap, rinse and pat dry prior to dressing wounds Off-Loading: Wound #1 Right Gluteus: Turn and reposition every 2 hours Wound #2 Sacrum: Turn and reposition every 2 hours Wound #3 Right Calcaneus: Heel suspension boot Turn and reposition every 2 hours Wound #4 Left Calcaneus: Heel suspension boot Turn and reposition every 2 hours Wound #6 Left Toe Great: Turn and reposition every 2 hours Negative Pressure Wound Therapy: Wound #2 Sacrum: Wound VAC settings at 125/130 mmHg continuous pressure. Use foam to wound cavity. Use WHITE foam to fill any tunnel/s and/or undermining. Change VAC dressing 2 X WEEK. Change canister as indicated when full. Nurse may titrate settings and frequency of dressing changes as clinically indicated. - Cont. NPWT as ordered Number of foam/gauze pieces used  in the dressing = - 2 WOUND #1: - Gluteus Wound Laterality: Right Cleanser: [place] Ancillary Kit - 15 Day Supply Every Other Day/30 Days Discharge Instructions: Use supplies as instructed; Kit contains: (15) Saline  Bullets; (15) 3x3 Gauze; 15 pr Gloves Primary Dressing: Promogran Matrix 4.34 (in) (Generic) Every Other Day/30 Days Discharge Instructions: Moisten w/normal saline or sterile water; Cover wound as directed. Do not remove from wound bed. Secondary Dressing: Mepilex Border Flex, 4x4 (in/in) Every Other Day/30 Days Discharge Instructions: Apply to wound as directed. Do not cut. WOUND #2: - Sacrum Wound Laterality: Primary Dressing: Promogran Matrix 4.34 (in) (Generic) Every Other Day/30 Days Discharge Instructions: Moisten w/normal saline or sterile water; Cover wound as directed. Do not remove from wound bed. WOUND #3: - Calcaneus Wound Laterality: Right Primary Dressing: Hydrofera Blue Ready Transfer Foam, 4x5 (in/in) (Generic) Every Other Day/30 Days Discharge Instructions: Apply Hydrofera Blue Ready to wound bed as directed Secondary Dressing: Kerlix 4.5 x 4.1 (in/yd) (Generic) Every Other Day/30 Days Discharge Instructions: Apply Kerlix 4.5 x 4.1 (in/yd) as instructed Add-Ons: ALLEVYN Heel 4 1/2in x 5 1/2in / 10.5cm x 13.5cm (Generic) Every Other Day/30 Days Discharge Instructions: Apply the white, patterned surface to the heel. WOUND #4: - Calcaneus Wound Laterality: Left Primary Dressing: Hydrofera Blue Ready Transfer Foam, 4x5 (in/in) (Generic) Every Other Day/30 Days Moan, Lazarius ([id number]) Discharge Instructions: Apply Hydrofera Blue Ready to wound bed as directed Secondary Dressing: Kerlix 4.5 x 4.1 (in/yd) (Generic) Every Other Day/30 Days Discharge Instructions: Apply Kerlix 4.5 x 4.1 (in/yd) as instructed Add-Ons: ALLEVYN Heel 4 1/2in x 5 1/2in / 10.5cm x 13.5cm (Generic) Every Other Day/30 Days Discharge Instructions: Apply the white, patterned surface to the heel. WOUND #6: - Toe Great Wound Laterality: Left Primary Dressing: Hydrofera Blue Ready Transfer Foam, 2.5x2.5 (in/in) (Generic) Every Other Day/30 Days Discharge Instructions: Apply Hydrofera Blue Ready to wound bed  as directed Secondary Dressing: Conforming Guaze Roll-Small (Generic) Every Other Day/30 Days Discharge Instructions: Apply Conforming Stretch [place] as directed 1. We continued with the wound VAC on the sacral wound. This appears to be contracting. 2. I am continue with Hydrofera Blue on the heels he will likely require more aggressive debridement next time. Armed with the relatively normal ABIs I am more relaxed about doing this 3. He has completed his antibiotics for the underlying sacral osteomyelitis. I am uncertain if any infectious disease follow-up is arranged Electronic Signature(s) Signed: 10/19/2020 11:56:06 AM By: Linton Ham MD Entered By: Linton Ham on 10/18/2020 12:45:38 Worlds, Travin ([id number]) -------------------------------------------------------------------------------- SuperBill Details Patient Name: Ryan Day Date of Service: 10/18/2020 Medical Record Number: [id number] Patient Account Number: [id number] Date of Birth/Sex: Dec 03, 1950 (69 y.o. M) Treating RN: Carlene Coria Primary Care Provider: Park Liter Other Clinician: Referring Provider: Park Liter Treating Provider/Extender: Tito Dine in Treatment: 2 Diagnosis Coding ICD-10 Codes Code Description L89.154 Pressure ulcer of sacral region, stage 4 L89.312 Pressure ulcer of right buttock, stage 2 S14.104D Unspecified injury at C4 level of cervical spinal cord, subsequent encounter L89.612 Pressure ulcer of right heel, stage 2 L89.620 Pressure ulcer of left heel, unstageable Facility Procedures CPT4 Code: [id number] Description: 76734 - WOUND VAC-50 SQ CM OR LESS Modifier: Quantity: 1 Physician Procedures CPT4 Code: 1937902 Description: 99214 - WC PHYS LEVEL 4 - EST PT Modifier: Quantity: 1 CPT4 Code: Description: ICD-10 Diagnosis Description L89.154 Pressure ulcer of sacral region, stage 4 L89.312 Pressure ulcer of right buttock, stage 2 L89.620 Pressure ulcer of left  heel, unstageable I09.735  Pressure ulcer of right heel, stage 2 Modifier: Quantity: Electronic Signature(s) Signed: 10/19/2020 11:56:06 AM By: Linton Ham MD Entered By: Linton Ham on 10/18/2020 12:46:03

## 2020-10-19 NOTE — Telephone Encounter (Signed)
Patient's caregiver called to ask the nurse to call regarding patient's UTI.  She stated that the patient needs some medication and said the doctor or nurse would know what to do since the patient cannot come to the office because of health issues.  Please call asap to advise.  CB# [phone]

## 2020-10-19 NOTE — Telephone Encounter (Signed)
Patient notified

## 2020-10-20 ENCOUNTER — Ambulatory Visit: Payer: BC Managed Care – PPO | Admitting: Pharmacist

## 2020-10-20 DIAGNOSIS — S14109S Unspecified injury at unspecified level of cervical spinal cord, sequela: Secondary | ICD-10-CM

## 2020-10-20 DIAGNOSIS — G8250 Quadriplegia, unspecified: Secondary | ICD-10-CM

## 2020-10-20 NOTE — Chronic Care Management (AMB) (Signed)
Chronic Care Management Pharmacy  Name: Ryan Day  MRN: [id number] DOB: 27-Jan-1951   Chief Complaint/ HPI  Ryan Day,  70 y.o. , male presents for his Initial CCM visit with the clinical pharmacist via telephone. Spoke to wife. Referred by [facility] for assistance with medication cost concerns.  PCP : Valerie Roys, DO Patient Care Team: Valerie Roys, DO as PCP - General (Family Medicine) Valerie Roys, DO as Referring Physician (Family Medicine) Vanita Ingles, RN as Registered Nurse (General Practice) Vanita Ingles, RN as Registered Nurse ([place]) Vanita Ingles, RN as Registered Nurse ([place]) Vanita Ingles, RN as Registered Nurse (General Practice)  Patient's chronic conditions include: Hypertension, Hyperlipidemia and neurogenic bladder, quadriplegia s/p fall Oct 2021   Office Visits: 10/19/20- Dr. Bary Richard 500 mg bid x 7 d for UTI, CX P - GNR  Consult Visit: 10/18/20- Dr. Dellia Nims, [place]-       Objective: No Known Allergies  Medications: Outpatient Encounter Medications as of 10/20/2020  Medication Sig  . acetaminophen (TYLENOL) 160 MG/5ML solution Place 10-20 mLs (320-640 mg total) into feeding tube every 4 (four) hours as needed for mild pain.  Marland Kitchen ascorbic acid (VITAMIN C) 500 MG tablet Place 1 tablet (500 mg total) into feeding tube 2 (two) times daily.  . Baclofen 5 MG TABS PLACE 1 TO 2 TABLETS INTO THE FEEDING TUBE THREE TIMES DAILY  . bisacodyl (DULCOLAX) 10 MG suppository Place 1 suppository (10 mg total) rectally at bedtime.  . chlorhexidine (PERIDEX) 0.12 % solution 15 mLs by Mouth Rinse route 2 (two) times daily.  . ciprofloxacin (CIPRO) 500 MG tablet Take 1 tablet (500 mg total) by mouth 2 (two) times daily for 7 days.  . collagenase (SANTYL) ointment Apply topically daily.  . diclofenac Sodium (VOLTAREN) 1 % GEL Apply 4 g topically 4 (four) times daily.  . famotidine (PEPCID) 20 MG tablet Place 1 tablet (20 mg  total) into feeding tube daily.  . ferrous sulfate 300 (60 Fe) MG/5ML syrup Place 1.3 mLs (78 mg total) into feeding tube daily.  Marland Kitchen FLUoxetine (PROZAC) 20 MG/5ML solution Place 10 mLs (40 mg total) into feeding tube daily.  Marland Kitchen guaiFENesin 200 MG tablet Place 2 tablets (400 mg total) into feeding tube every 6 (six) hours.  . lidocaine (LIDODERM) 5 % Place 1 patch onto the skin daily. Remove & Discard patch within 12 hours or as directed by MD  . midodrine (PROAMATINE) 10 MG tablet Place 1 tablet (10 mg total) into feeding tube 3 (three) times daily with meals.  . Mouthwashes (MOUTH RINSE) LIQD solution 15 mLs by Mouth Rinse route 2 times daily at 12 noon and 4 pm.  . Multiple Vitamin (MULTIVITAMIN) LIQD Place 5 mLs into feeding tube daily.  . nutrition supplement, JUVEN, (JUVEN) PACK Place 1 packet into feeding tube 2 (two) times daily between meals.  . Nutritional Supplements (FEEDING SUPPLEMENT, OSMOLITE 1.5 CAL,) LIQD Place 474 mLs into feeding tube 4 (four) times daily.  . Nutritional Supplements (FEEDING SUPPLEMENT, PROSOURCE TF,) liquid Place 45 mLs into feeding tube 2 (two) times daily.  . ondansetron (ZOFRAN) 4 MG tablet Place 1 tablet (4 mg total) into feeding tube every 8 (eight) hours as needed for nausea, vomiting or refractory nausea / vomiting.  . polycarbophil (FIBERCON) 625 MG tablet Place 1 tablet (625 mg total) into feeding tube daily.  . rivaroxaban (XARELTO) 10 MG TABS tablet Take one tablet per tube daily  .  saccharomyces boulardii (FLORASTOR) 250 MG capsule Place 1 capsule (250 mg total) into feeding tube 2 (two) times daily.  . sennosides (SENOKOT) 8.8 MG/5ML syrup Place 5 mLs into feeding tube daily at 6 (six) AM.  . simethicone (MYLICON) 40 IH/4.7QQ drops Place 1.2 mLs (80 mg total) into feeding tube 4 (four) times daily.  . Water For Irrigation, Sterile (FREE WATER) SOLN Place 400 mLs into feeding tube every 4 (four) hours. (Patient taking differently: Place 300 mLs into  feeding tube every 4 (four) hours.)  . zinc sulfate 220 (50 Zn) MG capsule Place 1 capsule (220 mg total) into feeding tube daily.  . [DISCONTINUED] azithromycin (ZITHROMAX) 250 MG tablet 2 tabs today, then 1 tab daily for 4 days by tube  . [DISCONTINUED] traZODone (DESYREL) 50 MG tablet Place 1 tablet (50 mg total) into feeding tube at bedtime.   No facility-administered encounter medications on file as of 10/20/2020.    Wt Readings from Last 3 Encounters:  08/15/20 249 lb 5.4 oz (113.1 kg)  08/08/20 245 lb 2.4 oz (111.2 kg)  07/23/20 238 lb 1.6 oz (108 kg)    Lab Results  Component Value Date   CREATININE 0.63 08/25/2020   BUN 18 08/25/2020   GFRNONAA >60 08/25/2020   GFRAA >60 06/26/2020   NA 146 (H) 08/25/2020   K 3.7 08/25/2020   CALCIUM 9.4 08/25/2020   CO2 27 08/25/2020     Current Diagnosis/Assessment:    Goals Addressed   None     Spasticity   Patient has failed these meds in past: NA Patient is currently controlled on the following medications:  . Baclofen 5 mg 2 tabs tid  We discussed:  Spouse handles patient medications and her main concern today is the cost of baclofen and fluoxetine solution. Per her report she paid ~$52 for 60 tablets.She is unsure why qty has been reduced per fill and she has going to the pharmacy every 8-10 days. Per computer records days supply does not match sig.  Good RX coupon will allow her to purchase 180 5 mg  tablets for ~$53. 10 mg tabs are cheaper ~$14 for #90. Will discuss with pcp and follow up with spouse.   Plan  Continue current medications  ?Depression   Patient has failed these meds in past: NA Patient is currently controlled on the following medications:  . Fluoxetine solution 20mg /33ml  10 ml in feeding tube daily  We discussed:  Similar situation to baclofen. Per spouse she is only getting ~ 8 days supply at a time not per her request. This is costly and inconvenient. Lowest price available through [place] is  ~$78 for 3 120 ml bottles. Will follow up with [facility] to see how this compares to patient insurance price  Plan  Continue current medications   Patient has filed for disability and awaiting approval.    Medication Management   Patient's preferred pharmacy is:  [facility] [address]), [place] - [place] ([place]) [place] [address] Phone: [phone] Fax: [phone]   Plan  Continue current medication management strategy    Follow up: in 7-10 days  Junita Push. Kenton Kingfisher PharmD, [facility] [phone]

## 2020-10-21 LAB — URINE CULTURE

## 2020-10-22 ENCOUNTER — Other Ambulatory Visit: Payer: Self-pay | Admitting: Physical Medicine and Rehabilitation

## 2020-10-23 ENCOUNTER — Telehealth: Payer: Self-pay | Admitting: Nurse Practitioner

## 2020-10-23 ENCOUNTER — Other Ambulatory Visit: Payer: BC Managed Care – PPO | Admitting: Nurse Practitioner

## 2020-10-23 ENCOUNTER — Telehealth: Payer: Self-pay | Admitting: Pulmonary Disease

## 2020-10-23 ENCOUNTER — Other Ambulatory Visit: Payer: Self-pay

## 2020-10-23 ENCOUNTER — Telehealth (INDEPENDENT_AMBULATORY_CARE_PROVIDER_SITE_OTHER): Payer: BC Managed Care – PPO | Admitting: Nurse Practitioner

## 2020-10-23 ENCOUNTER — Encounter: Payer: Self-pay | Admitting: Nurse Practitioner

## 2020-10-23 VITALS — BP 105/71 | HR 87 | Temp 98.9°F | Resp 21

## 2020-10-23 DIAGNOSIS — N3000 Acute cystitis without hematuria: Secondary | ICD-10-CM

## 2020-10-23 DIAGNOSIS — R32 Unspecified urinary incontinence: Secondary | ICD-10-CM

## 2020-10-23 MED ORDER — TRAZODONE HCL 50 MG PO TABS
50.0000 mg | ORAL_TABLET | Freq: Every day | ORAL | 0 refills | Status: AC
Start: 2020-10-23 — End: ?

## 2020-10-23 NOTE — Telephone Encounter (Signed)
[name], [name] [facility]) called regarding pt appt on 11/07/20 at 10:30. She wanted to know if consult could be a televisit b/c pt is paralyzed and requires EMS for transporting and it is expensive w/ his many appts. Advised that we do not do televisit for consults. [name] asked if appt could be on 11/09/20 since pt has another appt that day and transportation. Advised that Dr. Darnell Level does not have any appts that day. Pt wife wanted to know if appt could be put off. Referral was sent in urgent. Please advise.

## 2020-10-23 NOTE — Progress Notes (Addendum)
BP 105/71   Pulse 87   Temp 98.9 F (37.2 C) (Temporal)   Resp (!) 21   SpO2 96%    Subjective:    Patient ID: Ryan Day, male    DOB: [birth year], 70 y.o.   MRN: [id number]  HPI: Ryan Day is a 70 y.o. male  Chief Complaint  Patient presents with  . Urinary Tract Infection    [name] states that they dropped off urine sample at the end of last week, still taking antibiotic that was prescribed by Dr. Ky Barban.    URINARY SYMPTOMS Patient has a foley catheter.  Patient did not have any symptoms but the urine had a odor and had a lot of sediment.  Patient's family states that odor and sediment have resolved since starting the Ciprofloxacin.  Patient's family states that they do not have a plan in place for regular foley changes.  Daughter last changed the foley herself on 09/18/2021.  Family states that they have not heard from home health since their last visit.     Relevant past medical, surgical, family and social history reviewed and updated as indicated. Interim medical history since our last visit reviewed. Allergies and medications reviewed and updated.  Review of Systems  Genitourinary:       Foul odor of the urine and sediment.    Per HPI unless specifically indicated above     Objective:    BP 105/71   Pulse 87   Temp 98.9 F (37.2 C) (Temporal)   Resp (!) 21   SpO2 96%   Wt Readings from Last 3 Encounters:  08/15/20 249 lb 5.4 oz (113.1 kg)  08/08/20 245 lb 2.4 oz (111.2 kg)  07/23/20 238 lb 1.6 oz (108 kg)    Physical Exam Vitals and nursing note reviewed.  Constitutional:      Appearance: Normal appearance.  HENT:     Head: Normocephalic.     Right Ear: External ear normal.     Left Ear: External ear normal.     Nose: Nose normal.  Eyes:     Pupils: Pupils are equal, round, and reactive to light.  Pulmonary:     Effort: Pulmonary effort is normal.  Musculoskeletal:     Comments: Patient is bed bound.  Neurological:     General: No focal  deficit present.     Mental Status: He is alert and oriented to person, place, and time.  Psychiatric:        Mood and Affect: Mood normal.        Behavior: Behavior normal.        Thought Content: Thought content normal.        Judgment: Judgment normal.     Results for orders placed or performed in visit on 10/18/20  Urine Culture   Specimen: Urine   UR  Result Value Ref Range   Urine Culture, Routine Final report (A)    Organism ID, Bacteria Comment (A)    Antimicrobial Susceptibility Comment   Microscopic Examination   Urine  Result Value Ref Range   WBC, UA 11-30 (A) 0 - 5 /hpf   RBC 11-30 (A) 0 - 2 /hpf   Epithelial Cells (non renal) 0-10 0 - 10 /hpf   Crystals Present N/A   Crystal Type Calcium Oxalate N/A   Bacteria, UA Moderate (A) None seen/Few  Urinalysis, Routine w reflex microscopic  Result Value Ref Range   Specific Gravity, UA 1.015 1.005 - 1.030  pH, UA 7.0 5.0 - 7.5   Color, UA Yellow Yellow   Appearance Ur Cloudy (A) Clear   Leukocytes,UA 3+ (A) Negative   Protein,UA 1+ (A) Negative/Trace   Glucose, UA Negative Negative   Ketones, UA Negative Negative   RBC, UA 3+ (A) Negative   Bilirubin, UA Negative Negative   Urobilinogen, Ur 0.2 0.2 - 1.0 mg/dL   Nitrite, UA Positive (A) Negative   Microscopic Examination See below:       Assessment & Plan:   Problem List Items Addressed This Visit   None   Visit Diagnoses    Acute cystitis without hematuria    -  Primary   Complete course of antibiotics and increase fluids.  Bring repeat urine sample 7 days after completing course of antibiotics to ensure infection has cleared.     Relevant Orders   Urine Culture   Urinary incontinence, unspecified type       Referral placed for Urology.  Followed up on [facility] referral.  Company should be in contact with family to set up services.    Relevant Orders   Ambulatory referral to Urology       Follow up plan: Return if symptoms worsen or fail to  improve.     This visit was completed via MyChart due to the restrictions of the COVID-19 pandemic. All issues as above were discussed and addressed. Physical exam was done as above through visual confirmation on MyChart. If it was felt that the patient should be evaluated in the office, they were directed there. The patient verbally consented to this visit.  Location of the patient: home  Location of the provider: work  Those involved with this call:  ? Provider: Jon Billings, NP ? CMA: Yvonna Alanis, CMA ? Front Desk/Registration: Jill Side   Time spent on call: 15 minutes with patient face to face via video conference. More than 50% of this time was spent in counseling and coordination of care. 23 minutes total spent in review of patient's record and preparation of their chart.

## 2020-10-23 NOTE — Chronic Care Management (AMB) (Incomplete)
Chronic Care Management Pharmacy  Name: Ryan Day  MRN: [id number] DOB: 20-Sep-1951   Chief Complaint/ HPI  Ryan Day,  70 y.o. , male presents for his Initial CCM visit with the clinical pharmacist via telephone. Spoke to wife. Referred by [facility] for assistance with medication cost concerns.  PCP : Ryan Roys, DO Patient Care Team: Ryan Roys, DO as PCP - General (Family Medicine) Ryan Roys, DO as Referring Physician (Family Medicine) Ryan Ingles, RN as Registered Nurse (General Practice) Ryan Ingles, RN as Registered Nurse ([place]) Ryan Ingles, RN as Registered Nurse ([place]) Ryan Ingles, RN as Registered Nurse (General Practice)  Patient's chronic conditions include: Hypertension, Hyperlipidemia and neurogenic bladder, quadriplegia s/p fall Oct 2021   Office Visits: 10/19/20- Dr. Bary Richard 500 mg bid x 7 d for UTI, CX P - GNR  Consult Visit: 10/18/20- Dr. Dellia Nims, [place]-       Objective: No Known Allergies  Medications: Outpatient Encounter Medications as of 10/20/2020  Medication Sig  . acetaminophen (TYLENOL) 160 MG/5ML solution Place 10-20 mLs (320-640 mg total) into feeding tube every 4 (four) hours as needed for mild pain.  Marland Kitchen ascorbic acid (VITAMIN C) 500 MG tablet Place 1 tablet (500 mg total) into feeding tube 2 (two) times daily.  Marland Kitchen azithromycin (ZITHROMAX) 250 MG tablet 2 tabs today, then 1 tab daily for 4 days by tube  . Baclofen 5 MG TABS PLACE 1 TO 2 TABLETS INTO THE FEEDING TUBE THREE TIMES DAILY  . bisacodyl (DULCOLAX) 10 MG suppository Place 1 suppository (10 mg total) rectally at bedtime.  . chlorhexidine (PERIDEX) 0.12 % solution 15 mLs by Mouth Rinse route 2 (two) times daily.  . ciprofloxacin (CIPRO) 500 MG tablet Take 1 tablet (500 mg total) by mouth 2 (two) times daily for 7 days.  . collagenase (SANTYL) ointment Apply topically daily.  . diclofenac Sodium (VOLTAREN) 1 % GEL Apply 4 g  topically 4 (four) times daily.  . famotidine (PEPCID) 20 MG tablet Place 1 tablet (20 mg total) into feeding tube daily.  . ferrous sulfate 300 (60 Fe) MG/5ML syrup Place 1.3 mLs (78 mg total) into feeding tube daily.  Marland Kitchen FLUoxetine (PROZAC) 20 MG/5ML solution Place 10 mLs (40 mg total) into feeding tube daily.  Marland Kitchen guaiFENesin 200 MG tablet Place 2 tablets (400 mg total) into feeding tube every 6 (six) hours.  . lidocaine (LIDODERM) 5 % Place 1 patch onto the skin daily. Remove & Discard patch within 12 hours or as directed by MD  . midodrine (PROAMATINE) 10 MG tablet Place 1 tablet (10 mg total) into feeding tube 3 (three) times daily with meals.  . Mouthwashes (MOUTH RINSE) LIQD solution 15 mLs by Mouth Rinse route 2 times daily at 12 noon and 4 pm.  . Multiple Vitamin (MULTIVITAMIN) LIQD Place 5 mLs into feeding tube daily.  . nutrition supplement, JUVEN, (JUVEN) PACK Place 1 packet into feeding tube 2 (two) times daily between meals.  . Nutritional Supplements (FEEDING SUPPLEMENT, OSMOLITE 1.5 CAL,) LIQD Place 474 mLs into feeding tube 4 (four) times daily.  . Nutritional Supplements (FEEDING SUPPLEMENT, PROSOURCE TF,) liquid Place 45 mLs into feeding tube 2 (two) times daily.  . ondansetron (ZOFRAN) 4 MG tablet Place 1 tablet (4 mg total) into feeding tube every 8 (eight) hours as needed for nausea, vomiting or refractory nausea / vomiting.  . polycarbophil (FIBERCON) 625 MG tablet Place 1 tablet (625 mg total) into  feeding tube daily.  . rivaroxaban (XARELTO) 10 MG TABS tablet Take one tablet per tube daily  . saccharomyces boulardii (FLORASTOR) 250 MG capsule Place 1 capsule (250 mg total) into feeding tube 2 (two) times daily.  . sennosides (SENOKOT) 8.8 MG/5ML syrup Place 5 mLs into feeding tube daily at 6 (six) AM.  . simethicone (MYLICON) 40 SW/9.6PR drops Place 1.2 mLs (80 mg total) into feeding tube 4 (four) times daily.  . traZODone (DESYREL) 50 MG tablet Place 1 tablet (50 mg total)  into feeding tube at bedtime.  . Water For Irrigation, Sterile (FREE WATER) SOLN Place 400 mLs into feeding tube every 4 (four) hours. (Patient taking differently: Place 300 mLs into feeding tube every 4 (four) hours.)  . zinc sulfate 220 (50 Zn) MG capsule Place 1 capsule (220 mg total) into feeding tube daily.   No facility-administered encounter medications on file as of 10/20/2020.    Wt Readings from Last 3 Encounters:  08/15/20 249 lb 5.4 oz (113.1 kg)  08/08/20 245 lb 2.4 oz (111.2 kg)  07/23/20 238 lb 1.6 oz (108 kg)    Lab Results  Component Value Date   CREATININE 0.63 08/25/2020   BUN 18 08/25/2020   GFRNONAA >60 08/25/2020   GFRAA >60 06/26/2020   NA 146 (H) 08/25/2020   K 3.7 08/25/2020   CALCIUM 9.4 08/25/2020   CO2 27 08/25/2020     Current Diagnosis/Assessment:    Goals Addressed   None     Spasticity   Patient has failed these meds in past: NA Patient is currently controlled on the following medications:  . Baclofen 5 mg 2 tabs tid  We discussed:  Spouse handles patient medications and her main concern today is the cost of baclofen and fluoxetine solution. Per her report she paid ~  Plan  Continue {[facility] FFMBW:[id number]}        Medication Management   Patient's preferred pharmacy is:  [facility] [address] ([address]), [place] - [place] ([place]) [place] [address] Phone: [phone] Fax: [phone]  Uses pill box? {Yes or If no, why not?:20788} Pt endorses ***% compliance  We discussed: {Pharmacy options:24294}  Plan  {[facility] KTGY:56389}    Follow up: *** month phone visit  ***

## 2020-10-23 NOTE — Telephone Encounter (Signed)
Received verbal from Patient to speak to wife, [name].  Spoke to [place] and verified below message.  Dr. Patsey Berthold, please advise. Thanks.

## 2020-10-23 NOTE — Telephone Encounter (Signed)
I called [name] to confirm PC f/u visit today. [name] requested to reschedule as they had PT coming at the same time as well as telemedicine primary care visit. Rescheduled per request

## 2020-10-24 NOTE — Patient Instructions (Addendum)
Visit Information  It was a pleasure speaking with you today! Thank you for letting me be a part of your care team. Please call with any questions or concerns.  Goals Addressed            This Visit's Progress   . Pharmacy care plan financial barriers       CARE PLAN ENTRY (see longitudinal plan of care for additional care plan information)  Current Barriers:  . Financial Barriers in complicated patient with multiple medical conditions includinghypertension, hyperlipidemia,?depression, neurogenic bladder and quadriplegia; patient has BCBS of [employer] and reports copay for baclofen & fluoxetine cost prohibitive at this time   Pharmacist Clinical Goal(s):  Marland Kitchen Over the next 30 days, patient will work with PharmD and providers to relieve medication access concerns  Interventions: . Comprehensive medication review completed; medication list updated in electronic medical record.  . Patient assistance not available for generic medications. Crystal coupon can cut cost of baclofen substantially and help with fluoxetine . Inter-disciplinary care team collaboration (see longitudinal plan of care) .   Patient Self Care Activities:  . Patient will present coupon to specified pharmacy  Initial goal documentation        [name] was given information about Chronic Care Management services today including:  1. CCM service includes personalized support from designated clinical staff supervised by his physician, including individualized plan of care and coordination with other care providers 2. 24/7 contact phone numbers for assistance for urgent and routine care needs. 3. Standard insurance, coinsurance, copays and deductibles apply for chronic care management only during months in which we provide at least 20 minutes of these services. Most insurances cover these services at 100%, however patients may be responsible for any copay, coinsurance and/or deductible if applicable. This service  may help you avoid the need for more expensive face-to-face services. 4. Only one practitioner may furnish and bill the service in a calendar month. 5. The patient may stop CCM services at any time (effective at the end of the month) by phone call to the office staff.  Patient agreed to services and verbal consent obtained.   The patient verbalized understanding of instructions, educational materials, and care plan provided today and agreed to receive a mailed copy of patient instructions, educational materials, and care plan.  Telephone follow up appointment with pharmacy team member scheduled for: 3 months  Junita Push. Neeva Trew PharmD, BCPS Clinical Pharmacist [phone]  Preventing Pressure Injuries  A pressure injury, sometimes called a bedsore or a pressure ulcer, is an injury to the skin and underlying tissue caused by pressure. A pressure injury can happen when your skin presses against a surface, such as a mattress or wheelchair seat, for too long. The pressure on the blood vessels causes reduced blood flow to your skin. This can eventually cause the skin tissue to die and break down into a wound. Pressure injuries usually develop:  Over bony parts of the body, such as the tailbone, shoulders, elbows, hips, and heels.  Under medical devices, such as respiratory equipment, stockings, tubes, and splints. How can this condition affect me? Pressure injuries are caused by a lack of blood supply to an area of skin. These injuries begin as a reddened area on the skin and can become an open sore. They can result from intense pressure over a short period of time or from less pressure over a long period of time. Pressure injuries can vary in severity. They can cause pain, muscle damage, and  infection. What can increase my risk? This condition is more likely to develop in people who:  Are in the hospital or an extended care facility.  Are bedridden or in a wheelchair.  Have an injury or  disease that keeps them from: ? Moving normally. ? Feeling pain or pressure. ? Communicating if they feel pain or pressure.  Have a condition that: ? Makes them sleepy or less alert. ? Causes poor blood flow.  Need to wear a medical device.  Have poor control of their bladder or bowel functions (incontinence).  Have poor nutrition (malnutrition).  Have had this condition before.  Are of certain ethnicities. People of African American, Latino, or Hispanic descent are at higher risk compared to other ethnic groups. What actions can I take to prevent pressure injuries? Reducing and redistributing pressure  Do not lie or sit in one position for a long time. Move or change position: ? Every hour when out of bed in a chair. ? Every two hours when in bed. ? As often as told by your health care provider.  Use pillows, wedges, or cushions to redistribute pressure. Ask your health care provider to recommend a mattress, cushions, or pads for you.  Use medical devices that do not rub your skin. Tell your health care provider if one of your medical devices is causing pain or irritation. Skin care If you are in the hospital, your health care providers:  Will inspect your skin, including areas under or around medical devices, at least twice a day.  May recommend that you use certain types of bedding to help prevent pressure injuries. These may include a pad, mattress, or chair cushion that is filled with gel, air, water, or foam.  Will evaluate your nutrition and consult a dietitian if needed.  Will inspect and change any wound dressings regularly.  May help you move into different positions every few hours.  Will adjust any medical devices and braces as needed to limit pressure on your skin.  Will keep your skin clean and dry.  May use gentle cleansers and skin protectants if you are incontinent.  Will moisturize any dry skin. In general, at home:  Keep your skin clean and dry.  Gently pat your skin dry.  Do not rub or massage bony areas of your skin.  Moisturize dry skin.  Use gentle cleansers and skin protectants routinely if you are incontinent.  Check your skin at least once a day for any changes in color and for any new blisters or sores. Make sure to check under and around any medical devices and between skin folds. Have a caregiver do this for you if you are not able.   Lifestyle  Be as active as you can every day. Ask your health care provider to suggest safe exercises or activities.  Do not abuse drugs or alcohol.  Do not use any products that contain nicotine or tobacco, such as cigarettes, e-cigarettes, and chewing tobacco. If you need help quitting, ask your health care provider. General instructions  Take over-the-counter and prescription medicines only as told by your health care provider.  Work with your health care provider to manage any chronic health conditions.  Eat a healthy diet that includes protein, vitamins, and minerals. Ask your health care provider what types of food you should eat.  Drink enough fluid to keep your urine pale yellow.  Keep all follow-up visits as told by your health care provider. This is important.   Contact a health  care provider if you:  Feel or see any changes in your skin. Summary  A pressure injury, sometimes called a bedsore or a pressure ulcer, is an injury to the skin and underlying tissue caused by pressure.  Do not lie or sit in one position for a long time.  Check your skin at least once a day for any changes in color and for any new blisters or sores.  Make sure to check under and around any medical devices and between skin folds. Have a caregiver do this for you if you are not able.  Eat a healthy diet that includes protein, vitamins, and minerals. Ask your health care provider what types of food you should eat. This information is not intended to replace advice given to you by your health  care provider. Make sure you discuss any questions you have with your health care provider. Document Revised: 01/01/2019 Document Reviewed: 06/02/2018 Elsevier Patient Education  2021 [place].  Blood Pressure Record Sheet To take your blood pressure, you will need a blood pressure machine. You can buy a blood pressure machine (blood pressure monitor) at your clinic, drug store, or online. When choosing one, consider:  An automatic monitor that has an arm cuff.  A cuff that wraps snugly around your upper arm. You should be able to fit only one finger between your arm and the cuff.  A device that stores blood pressure reading results.  Do not choose a monitor that measures your blood pressure from your wrist or finger. Follow your health care provider's instructions for how to take your blood pressure. To use this form:  Get one reading in the morning (a.m.) before you take any medicines.  Get one reading in the evening (p.m.) before supper.  Take at least 2 readings with each blood pressure check. This makes sure the results are correct. Wait 1-2 minutes between measurements.  Write down the results in the spaces on this form.  Repeat this once a week, or as told by your health care provider.  Make a follow-up appointment with your health care provider to discuss the results. Blood pressure log Date: _______________________  a.m. _____________________(1st reading) _____________________(2nd reading)  p.m. _____________________(1st reading) _____________________(2nd reading) Date: _______________________  a.m. _____________________(1st reading) _____________________(2nd reading)  p.m. _____________________(1st reading) _____________________(2nd reading) Date: _______________________  a.m. _____________________(1st reading) _____________________(2nd reading)  p.m. _____________________(1st reading) _____________________(2nd reading) Date: _______________________  a.m.  _____________________(1st reading) _____________________(2nd reading)  p.m. _____________________(1st reading) _____________________(2nd reading) Date: _______________________  a.m. _____________________(1st reading) _____________________(2nd reading)  p.m. _____________________(1st reading) _____________________(2nd reading) This information is not intended to replace advice given to you by your health care provider. Make sure you discuss any questions you have with your health care provider. Document Revised: 12/29/2019 Document Reviewed: 12/29/2019 Elsevier Patient Education  2021 [employer].

## 2020-10-25 ENCOUNTER — Telehealth: Payer: Self-pay

## 2020-10-25 ENCOUNTER — Telehealth: Payer: Self-pay | Admitting: Pharmacist

## 2020-10-25 NOTE — Telephone Encounter (Signed)
Was just seen 1/31. Not 1/27. Needs to await culture. Message sent today trying to arrange in person appointment.

## 2020-10-25 NOTE — Telephone Encounter (Signed)
It looks like he has all the follow-ups that he needs.  There is nothing that we can do about the mucus accumulating as this is just a mechanical issue and suction is what is needed.  Continue trach care as directed by Dr. Richardson Landry.

## 2020-10-25 NOTE — Progress Notes (Addendum)
Chronic Care Management Pharmacy Assistant   Name: Ryan Day  MRN: [id number] DOB: 1951/02/15  Reason for Encounter: Care Coordination Related To Medication Cost.    PCP : Valerie Roys, DO  Allergies:  No Known Allergies  Medications: Outpatient Encounter Medications as of 10/25/2020  Medication Sig   acetaminophen (TYLENOL) 160 MG/5ML solution Place 10-20 mLs (320-640 mg total) into feeding tube every 4 (four) hours as needed for mild pain.   ascorbic acid (VITAMIN C) 500 MG tablet Place 1 tablet (500 mg total) into feeding tube 2 (two) times daily.   Baclofen 5 MG TABS PLACE 1 TO 2 TABLETS INTO THE FEEDING TUBE THREE TIMES DAILY   bisacodyl (DULCOLAX) 10 MG suppository Place 1 suppository (10 mg total) rectally at bedtime.   chlorhexidine (PERIDEX) 0.12 % solution 15 mLs by Mouth Rinse route 2 (two) times daily.   ciprofloxacin (CIPRO) 500 MG tablet Take 1 tablet (500 mg total) by mouth 2 (two) times daily for 7 days.   collagenase (SANTYL) ointment Apply topically daily.   diclofenac Sodium (VOLTAREN) 1 % GEL Apply 4 g topically 4 (four) times daily.   famotidine (PEPCID) 20 MG tablet Place 1 tablet (20 mg total) into feeding tube daily.   ferrous sulfate 300 (60 Fe) MG/5ML syrup Place 1.3 mLs (78 mg total) into feeding tube daily.   FLUoxetine (PROZAC) 20 MG/5ML solution Place 10 mLs (40 mg total) into feeding tube daily.   guaiFENesin 200 MG tablet Place 2 tablets (400 mg total) into feeding tube every 6 (six) hours.   lidocaine (LIDODERM) 5 % Place 1 patch onto the skin daily. Remove & Discard patch within 12 hours or as directed by MD   midodrine (PROAMATINE) 10 MG tablet Place 1 tablet (10 mg total) into feeding tube 3 (three) times daily with meals.   Mouthwashes (MOUTH RINSE) LIQD solution 15 mLs by Mouth Rinse route 2 times daily at 12 noon and 4 pm.   Multiple Vitamin (MULTIVITAMIN) LIQD Place 5 mLs into feeding tube daily.   nutrition supplement, JUVEN, (JUVEN) PACK  Place 1 packet into feeding tube 2 (two) times daily between meals.   Nutritional Supplements (FEEDING SUPPLEMENT, OSMOLITE 1.5 CAL,) LIQD Place 474 mLs into feeding tube 4 (four) times daily.   Nutritional Supplements (FEEDING SUPPLEMENT, PROSOURCE TF,) liquid Place 45 mLs into feeding tube 2 (two) times daily.   ondansetron (ZOFRAN) 4 MG tablet Place 1 tablet (4 mg total) into feeding tube every 8 (eight) hours as needed for nausea, vomiting or refractory nausea / vomiting.   polycarbophil (FIBERCON) 625 MG tablet Place 1 tablet (625 mg total) into feeding tube daily.   rivaroxaban (XARELTO) 10 MG TABS tablet Take one tablet per tube daily   saccharomyces boulardii (FLORASTOR) 250 MG capsule Place 1 capsule (250 mg total) into feeding tube 2 (two) times daily.   sennosides (SENOKOT) 8.8 MG/5ML syrup Place 5 mLs into feeding tube daily at 6 (six) AM.   simethicone (MYLICON) 40 YW/7.3XT drops Place 1.2 mLs (80 mg total) into feeding tube 4 (four) times daily.   traZODone (DESYREL) 50 MG tablet Place 1 tablet (50 mg total) into feeding tube at bedtime.   Water For Irrigation, Sterile (FREE WATER) SOLN Place 400 mLs into feeding tube every 4 (four) hours. (Patient taking differently: Place 300 mLs into feeding tube every 4 (four) hours.)   zinc sulfate 220 (50 Zn) MG capsule Place 1 capsule (220 mg total) into feeding tube daily.  No facility-administered encounter medications on file as of 10/25/2020.    Current Diagnosis: Patient Active Problem List   Diagnosis Date Noted   Decubitus ulcer of sacral region, stage 4 ([place])    Hypokalemia 08/15/2020   Hypoalbuminemia 08/15/2020   Leukocytosis 08/15/2020   Pneumonia 08/14/2020   Goals of care, counseling/discussion    Sacral decubitus ulcer, stage II ([place]) 07/25/2020   Aspiration pneumonia ([place]) 07/25/2020   Palliative care by specialist    Salivary secretion    Spasticity    Spinal cord injury, cervical region, sequela ([place]) 06/23/2020    Pressure injury of skin 06/23/2020   S/P percutaneous endoscopic gastrostomy (PEG) tube placement ([place])    Tracheostomy in place ([place])    Quadriplegia ([place])    Neurogenic orthostatic hypotension ([facility])    Acute on chronic anemia    Hypernatremia    Neurogenic bladder    Neurogenic bowel    Acute on chronic respiratory failure with hypoxia ([facility])    Autonomic instability    Cardiac arrest ([place])    Cervical spinal cord injury, sequela ([place])    Acute renal injury due to hypovolemia [facility])    Family history of stomach cancer    Exercise-induced leg cramps 05/13/2017   Elevated alkaline phosphatase level 03/13/2015   Hyperlipidemia    Hypertension     10-25-2020: The wife expressed she was having financial difficulties affording the patient's Baclofen and Fluoxetine solution during her visit with PharmD Birdena [name]. Called the patient's pharmacy [facility] and spoke to a representative to discuss why the patient's copays are high. Per the representative, the patient's insurance will not cover his Baclofen but will cover his Fluoxetine. The last time Fluoxetine was picked up the patient's insurance would not cover this medication due to the medication being filled too soon. After researching,  the best option for the patient to afford his Baclofen would be to fill this medication at [facility] and to use a [place] card.   Called the wife and explained the above. She verbalized understanding of the information given. She did explain she would go to [facility] to pick up his Baclofen. She was very pleasant and appreciative during the phone call. Made PharmD Birdena [name] aware of the conversation.  Cloretta Ned, LPN Clinical Pharmacist Assistant  [phone]     Follow-Up:  Pharmacist Review  I have reviewed the care management and care coordination activities outlined in this encounter and I am certifying that I agree with the content of this note.  Junita Push. Kenton Kingfisher PharmD, [place] [facility] [phone]

## 2020-10-25 NOTE — Telephone Encounter (Signed)
Called and spoke with patients wife, I let her know that the culture is not back yet and that we will let her know when it returns and if any other medication needs to be given.

## 2020-10-25 NOTE — Telephone Encounter (Signed)
This patient has been evaluated previously by Dr. Chancy Milroy, Dr. Mortimer Fries and [name].  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  My recommendation would be for Palliative Care/Hospice evaluation in the home.    Other alternative would be skilled nursing facility.  Immediate care is what is desired, this can be achieved through home health clinic in [place].  A home health service that usually handles trachs well is [facility].

## 2020-10-25 NOTE — Telephone Encounter (Signed)
Copied from [place] [id number]. Topic: General - Other >> Oct 25, 2020  4:09 PM Tessa Lerner A wrote: Reason for CRM: Patient's wife called to notify practice that patient continues to experience urinary issues  Patient was seen virtually on 10/19/20 and prescribed ciprofloxacin (CIPRO) 500 MG tablet Patient's wife would like to be contacted to discuss solutions as well as additional prescriptions  Please advise  Would he need apt?

## 2020-10-25 NOTE — Telephone Encounter (Signed)
Spoke to patient's spouse, [name].  [name] stated that patient is currently under palliative care. She is currently in the process of getting him into rehab at [facility].  Per [name], Dr. Richardson Landry is managing trach care. Patient has upcoming appt with Dr. Richardson Landry on 11/09/2020. Patient does have mucus in his throat, however suctioning his trach gives him relief. Denied additional respiratory symptoms.   Dr. Patsey Berthold, please advise. thanks

## 2020-10-25 NOTE — Telephone Encounter (Signed)
Spoke to patient's spouse, [name]. [name] is agreeable with canceling upcoming appt.  [name] will call for appt if patient develops any respiratory symptoms.  Nothing further needed.

## 2020-10-30 ENCOUNTER — Telehealth: Payer: Self-pay | Admitting: General Practice

## 2020-10-30 ENCOUNTER — Encounter: Payer: Self-pay | Admitting: Family Medicine

## 2020-10-30 ENCOUNTER — Telehealth: Payer: Self-pay

## 2020-10-30 DIAGNOSIS — G8250 Quadriplegia, unspecified: Secondary | ICD-10-CM

## 2020-10-30 NOTE — Telephone Encounter (Signed)
It looks like Dr.Cleveland is rehabilitation, separate encounter sent with information, will close this encounter due to duplicate ones open.

## 2020-10-30 NOTE — Telephone Encounter (Signed)
Copied from [place] [phone]. Topic: General - Other >> Oct 30, 2020  9:45 AM Celene Kras wrote: Reason for CRM: Pts wife called stating that she is trying to have the pt set up for inpatient rehab. She states that she is needing to have a referral for this. Pt has a virtual appt scheduled for 11/07/20. Please advise.   Would pt need apt sooner or can referral be sent?

## 2020-10-30 NOTE — Telephone Encounter (Signed)
Please advise 

## 2020-10-30 NOTE — Telephone Encounter (Signed)
What kind of doctor is Dr. Leonarda Salon. I'm happy to place this referral- but I don't know what it's for

## 2020-10-30 NOTE — Telephone Encounter (Signed)
I can't usually set up in-person rehab. I'm not sure this can be done. This is usually something done after a hospitalization.

## 2020-10-30 NOTE — Telephone Encounter (Signed)
Called and left a message asking patient's wife to return my call.

## 2020-10-30 NOTE — Telephone Encounter (Signed)
Copied to open telephone encounter.

## 2020-10-30 NOTE — Telephone Encounter (Signed)
  Copied from [place] [phone]. Topic: Referral - Request for Referral >> Oct 30, 2020  1:40 PM Tessa Lerner A wrote: Has patient seen PCP for this complaint? Yes   Referral for which specialty: Spine / Rehab   Preferred provider/office: Dr. Leonarda Salon / [facility]   Reason for referral: Rehabilitation

## 2020-10-30 NOTE — Telephone Encounter (Cosign Needed)
  Chronic Care Management   Outreach Note  10/30/2020 Name: Ryan Day MRN: [id number] DOB: 01/05/1951  Referred by: Valerie Roys, DO Reason for referral : Advice Only (RNCM: The patients wife [name] called and ask for help with a referral with Dr. Leonarda Salon in [facility] )   Incoming call from the wife, Izick Gasbarro asking for a referral for the patient as she has called [facility] and talked with Dr. Luiz Iron office and they need a referral from Dr. Wynetta Emery before the patient can be seen. The number is [phone]. The patients wife is asking for a call back.     Follow Up Plan: The care management team will reach out to the patient again over the next 30 to 60 days.   Noreene Larsson RN, MSN, [facility] Mobile: [phone]

## 2020-10-30 NOTE — Addendum Note (Signed)
Addended by: Valerie Roys on: 10/30/2020 02:51 PM   Modules accepted: Orders

## 2020-10-31 ENCOUNTER — Telehealth: Payer: Self-pay | Admitting: Nurse Practitioner

## 2020-10-31 NOTE — Telephone Encounter (Signed)
I called Mrs. Nevel back concerning bleeding from his penis, Ms. Barros endorses their daughter who is a nurse was able to get the bleeding stopped and okay now. We talked about not cancelling PC f/u visit ro reschedule if possible so can keep consistent visits. We talked about upcoming PC f/u visit. Ms. Wyszynski endorses she knew [facility] provider would not be able to come make a visit but was hoping a nurse could under PC. We talked about PC services, consult what is provided. Questions answered. Mrs Capaldi endorses Mr. Hinde is doing better now. Emotional support provided  Total time 20 minutes Documentation 5 minutes Phone discussion 15 minutes

## 2020-11-01 ENCOUNTER — Ambulatory Visit: Payer: BC Managed Care – PPO | Admitting: Internal Medicine

## 2020-11-02 ENCOUNTER — Other Ambulatory Visit: Payer: Self-pay | Admitting: Physical Medicine and Rehabilitation

## 2020-11-02 ENCOUNTER — Other Ambulatory Visit: Payer: Self-pay | Admitting: Family Medicine

## 2020-11-02 ENCOUNTER — Telehealth: Payer: Self-pay | Admitting: Family Medicine

## 2020-11-02 NOTE — Telephone Encounter (Signed)
Requested medication (s) are due for refill today - yes  Requested medication (s) are on the active medication list -yes  Future visit scheduled -yes  Last refill: 07/20/20  Notes to clinic: Request non delegated rx- outside provider  Requested Prescriptions  Pending Prescriptions Disp Refills   midodrine (PROAMATINE) 10 MG tablet 90 tablet 2    Sig: Place 1 tablet (10 mg total) into feeding tube 3 (three) times daily with meals.      Not Delegated - Cardiovascular: Midodrine Failed - 11/02/2020  9:07 AM      Failed - This refill cannot be delegated      Passed - Last BP in normal range    BP Readings from Last 1 Encounters:  10/23/20 105/71          Passed - Valid encounter within last 12 months    Recent Outpatient Visits           1 week ago Acute cystitis without hematuria   [place], NP   3 weeks ago [place], Megan P, DO   1 month ago Aspiration pneumonia of both lower lobes, unspecified aspiration pneumonia type [facility])   [place] Myles Gip, DO   2 months ago Neck pain   [facility] Kathrine Haddock, NP   9 months ago Routine general medical examination at a health care facility   [facility], [place], DO       Future Appointments             In 5 days Wynetta Emery, Barb Merino, DO [place], [facility]   In 3 weeks Sto[place], Ronda Fairly, MD [place]                 Requested Prescriptions  Pending Prescriptions Disp Refills   midodrine (PROAMATINE) 10 MG tablet 90 tablet 2    Sig: Place 1 tablet (10 mg total) into feeding tube 3 (three) times daily with meals.      Not Delegated - Cardiovascular: Midodrine Failed - 11/02/2020  9:07 AM      Failed - This refill cannot be delegated      Passed - Last BP in normal range    BP Readings from Last 1 Encounters:  10/23/20 105/71          Passed - Valid  encounter within last 12 months    Recent Outpatient Visits           1 week ago Acute cystitis without hematuria   [place], NP   3 weeks ago [place], Megan P, DO   1 month ago Aspiration pneumonia of both lower lobes, unspecified aspiration pneumonia type [facility])   [place], DO   2 months ago Neck pain   [facility] Kathrine Haddock, NP   9 months ago Routine general medical examination at a health care facility   [facility], [place], DO       Future Appointments             In 5 days Wynetta Emery, Barb Merino, DO [employer], [place]   In 3 weeks [place], Ronda Fairly, MD [place]

## 2020-11-02 NOTE — Telephone Encounter (Signed)
Medication Refill - Medication: midodrine (PROAMATINE) 10 MG tablet     Preferred Pharmacy (with phone number or street name):  [facility] [place] Phone:  [phone]  Fax:  [phone]       Agent: Please be advised that RX refills may take up to 3 business days. We ask that you follow-up with your pharmacy.

## 2020-11-02 NOTE — Telephone Encounter (Signed)
Patient would like the nurse to call regarding getting a script for a medication, Fludrocort, which was prescribed by a doctor in the ER, Dr. Erling Cruz.  Please advise and call patient to discuss at [phone]

## 2020-11-04 MED ORDER — MIDODRINE HCL 10 MG PO TABS: 10.0000 mg | ORAL_TABLET | Freq: Three times a day (TID) | ORAL | 2 refills | Status: DC

## 2020-11-05 ENCOUNTER — Other Ambulatory Visit: Payer: Self-pay | Admitting: Family Medicine

## 2020-11-05 NOTE — Telephone Encounter (Signed)
Requested medication (s) are due for refill today - yes  Requested medication (s) are on the active medication list -yes  Future visit scheduled -yes  Last refill: 10/05/20  Notes to clinic: Request RF non delegated Rx  Requested Prescriptions  Pending Prescriptions Disp Refills   Baclofen 5 MG TABS [Pharmacy Med Name: Baclofen 5 MG Oral Tablet] 180 tablet 0    Sig: PLACE 1-2 TABLETS INTO THE FEEDING TUBE THREE TIMES DAILY      Not Delegated - Analgesics:  Muscle Relaxants Failed - 11/05/2020  2:57 PM      Failed - This refill cannot be delegated      Passed - Valid encounter within last 6 months    Recent Outpatient Visits           1 week ago Acute cystitis without hematuria   [facility] Jon [place], NP   1 month ago [place], Megan P, DO   2 months ago Aspiration pneumonia of both lower lobes, unspecified aspiration pneumonia type [facility])   [place] Myles Gip, DO   2 months ago Neck pain   [facility] Kathrine Haddock, NP   9 months ago Routine general medical examination at a health care facility   [facility], Barb Merino, DO       Future Appointments             In 2 days Wynetta Emery, Barb Merino, DO [place], [place]   In 2 weeks Stoioff, Ronda Fairly, MD [place]                 Requested Prescriptions  Pending Prescriptions Disp Refills   Baclofen 5 MG TABS [Pharmacy Med Name: Baclofen 5 MG Oral Tablet] 180 tablet 0    Sig: PLACE 1-2 TABLETS INTO THE FEEDING TUBE THREE TIMES DAILY      Not Delegated - Analgesics:  Muscle Relaxants Failed - 11/05/2020  2:57 PM      Failed - This refill cannot be delegated      Passed - Valid encounter within last 6 months    Recent Outpatient Visits           1 week ago Acute cystitis without hematuria   [place], NP   1 month ago [place], DO   2 months ago Aspiration pneumonia of both lower lobes, unspecified aspiration pneumonia type [facility])   [place], DO   2 months ago Neck pain   [facility] Kathrine Haddock, NP   9 months ago Routine general medical examination at a health care facility   [facility], Barb Merino, DO       Future Appointments             In 2 days Wynetta Emery, Barb Merino, DO [place], [place]   In 2 weeks Stoioff, Ronda Fairly, MD [place]

## 2020-11-07 ENCOUNTER — Ambulatory Visit: Payer: BC Managed Care – PPO | Admitting: Family Medicine

## 2020-11-07 ENCOUNTER — Institutional Professional Consult (permissible substitution): Payer: BC Managed Care – PPO | Admitting: Pulmonary Disease

## 2020-11-07 ENCOUNTER — Encounter: Payer: Self-pay | Admitting: Family Medicine

## 2020-11-07 ENCOUNTER — Telehealth (INDEPENDENT_AMBULATORY_CARE_PROVIDER_SITE_OTHER): Payer: BC Managed Care – PPO | Admitting: Family Medicine

## 2020-11-07 DIAGNOSIS — Z538 Procedure and treatment not carried out for other reasons: Secondary | ICD-10-CM

## 2020-11-07 NOTE — Progress Notes (Signed)
Patient was not able to come in for in-person appointment. Will reschedule to [place] appointment

## 2020-11-08 ENCOUNTER — Telehealth: Payer: BC Managed Care – PPO | Admitting: General Practice

## 2020-11-08 ENCOUNTER — Encounter: Payer: BC Managed Care – PPO | Attending: Internal Medicine | Admitting: Internal Medicine

## 2020-11-08 ENCOUNTER — Ambulatory Visit (INDEPENDENT_AMBULATORY_CARE_PROVIDER_SITE_OTHER): Payer: BC Managed Care – PPO | Admitting: General Practice

## 2020-11-08 ENCOUNTER — Other Ambulatory Visit: Payer: Self-pay

## 2020-11-08 DIAGNOSIS — L89622 Pressure ulcer of left heel, stage 2: Secondary | ICD-10-CM | POA: Diagnosis not present

## 2020-11-08 DIAGNOSIS — L89312 Pressure ulcer of right buttock, stage 2: Secondary | ICD-10-CM | POA: Insufficient documentation

## 2020-11-08 DIAGNOSIS — I10 Essential (primary) hypertension: Secondary | ICD-10-CM

## 2020-11-08 DIAGNOSIS — L89154 Pressure ulcer of sacral region, stage 4: Secondary | ICD-10-CM

## 2020-11-08 DIAGNOSIS — G8250 Quadriplegia, unspecified: Secondary | ICD-10-CM | POA: Diagnosis not present

## 2020-11-08 DIAGNOSIS — L89610 Pressure ulcer of right heel, unstageable: Secondary | ICD-10-CM | POA: Insufficient documentation

## 2020-11-08 DIAGNOSIS — S14109S Unspecified injury at unspecified level of cervical spinal cord, sequela: Secondary | ICD-10-CM

## 2020-11-08 DIAGNOSIS — L89899 Pressure ulcer of other site, unspecified stage: Secondary | ICD-10-CM | POA: Insufficient documentation

## 2020-11-08 NOTE — Chronic Care Management (AMB) (Signed)
Chronic Care Management   CCM RN Visit Note  11/08/2020 Name: Ryan Day MRN: [id number] DOB: Jan 14, 1951  Subjective: Ryan Day is a 70 y.o. year old male who is a primary care patient of Vigg, A[place]i, MD. The care management team was consulted for assistance with disease management and care coordination needs.    Engaged with patient by telephone for follow up visit in response to provider referral for case management and/or care coordination services.   Consent to Services:  The patient was given information about Chronic Care Management services, agreed to services, and gave verbal consent prior to initiation of services.  Please see initial visit note for detailed documentation.   Patient agreed to services and verbal consent obtained.   Assessment: Review of patient past medical history, allergies, medications, health status, including review of consultants reports, laboratory and other test data, was performed as part of comprehensive evaluation and provision of chronic care management services.   SDOH (Social Determinants of Health) assessments and interventions performed:    CCM Care Plan  No Known Allergies  Outpatient Encounter Medications as of 11/08/2020  Medication Sig   acetaminophen (TYLENOL) 160 MG/5ML solution Place 10-20 mLs (320-640 mg total) into feeding tube every 4 (four) hours as needed for mild pain.   ascorbic acid (VITAMIN C) 500 MG tablet Place 1 tablet (500 mg total) into feeding tube 2 (two) times daily.   Baclofen 5 MG TABS PLACE 1 TO 2 TABLETS INTO THE FEEDING TUBE THREE TIMES DAILY   bisacodyl (DULCOLAX) 10 MG suppository Place 1 suppository (10 mg total) rectally at bedtime.   chlorhexidine (PERIDEX) 0.12 % solution 15 mLs by Mouth Rinse route 2 (two) times daily.   collagenase (SANTYL) ointment Apply topically daily. (Patient not taking: Reported on 11/07/2020)   diclofenac Sodium (VOLTAREN) 1 % GEL Apply 4 g topically 4 (four) times daily.    famotidine (PEPCID) 20 MG tablet Place 1 tablet (20 mg total) into feeding tube daily.   ferrous sulfate 300 (60 Fe) MG/5ML syrup Place 1.3 mLs (78 mg total) into feeding tube daily.   fludrocortisone (FLORINEF) 0.1 MG tablet Take 200 mcg by mouth daily.   FLUoxetine (PROZAC) 20 MG/5ML solution Place 10 mLs (40 mg total) into feeding tube daily.   guaiFENesin 200 MG tablet Place 2 tablets (400 mg total) into feeding tube every 6 (six) hours.   lidocaine (LIDODERM) 5 % Place 1 patch onto the skin daily. Remove & Discard patch within 12 hours or as directed by MD   midodrine (PROAMATINE) 10 MG tablet Place 1 tablet (10 mg total) into feeding tube 3 (three) times daily with meals.   Mouthwashes (MOUTH RINSE) LIQD solution 15 mLs by Mouth Rinse route 2 times daily at 12 noon and 4 pm.   Multiple Vitamin (MULTIVITAMIN) LIQD Place 5 mLs into feeding tube daily.   nutrition supplement, JUVEN, (JUVEN) PACK Place 1 packet into feeding tube 2 (two) times daily between meals.   Nutritional Supplements (FEEDING SUPPLEMENT, OSMOLITE 1.5 CAL,) LIQD Place 474 mLs into feeding tube 4 (four) times daily.   Nutritional Supplements (FEEDING SUPPLEMENT, PROSOURCE TF,) liquid Place 45 mLs into feeding tube 2 (two) times daily.   ondansetron (ZOFRAN) 4 MG tablet Place 1 tablet (4 mg total) into feeding tube every 8 (eight) hours as needed for nausea, vomiting or refractory nausea / vomiting.   polycarbophil (FIBERCON) 625 MG tablet Place 1 tablet (625 mg total) into feeding tube daily.   rivaroxaban (XARELTO) 10 MG  TABS tablet Take one tablet per tube daily   saccharomyces boulardii (FLORASTOR) 250 MG capsule Place 1 capsule (250 mg total) into feeding tube 2 (two) times daily.   sennosides (SENOKOT) 8.8 MG/5ML syrup Place 5 mLs into feeding tube daily at 6 (six) AM.   simethicone (MYLICON) 40 HG/9.9ME drops Place 1.2 mLs (80 mg total) into feeding tube 4 (four) times daily.   traZODone (DESYREL) 50 MG  tablet Place 1 tablet (50 mg total) into feeding tube at bedtime.   Water For Irrigation, Sterile (FREE WATER) SOLN Place 400 mLs into feeding tube every 4 (four) hours. (Patient taking differently: Place 300 mLs into feeding tube every 4 (four) hours.)   zinc sulfate 220 (50 Zn) MG capsule Place 1 capsule (220 mg total) into feeding tube daily.   No facility-administered encounter medications on file as of 11/08/2020.    Patient Active Problem List   Diagnosis Date Noted   Decubitus ulcer of sacral region, stage 4 ([place])    Hypokalemia 08/15/2020   Hypoalbuminemia 08/15/2020   Leukocytosis 08/15/2020   Pneumonia 08/14/2020   Goals of care, counseling/discussion    Sacral decubitus ulcer, stage II ([place]) 07/25/2020   Aspiration pneumonia ([place]) 07/25/2020   Palliative care by specialist    Salivary secretion    Spasticity    Spinal cord injury, cervical region, sequela ([place]) 06/23/2020   Pressure injury of skin 06/23/2020   S/P percutaneous endoscopic gastrostomy (PEG) tube placement (HCC)    Tracheostomy in place ([place])    Quadriplegia ([place])    Neurogenic orthostatic hypotension (HCC)    Acute on chronic anemia    Hypernatremia    Neurogenic bladder    Neurogenic bowel    Acute on chronic respiratory failure with hypoxia (HCC)    Autonomic instability    Cardiac arrest ([place])    Cervical spinal cord injury, sequela (HCC)    Acute renal injury due to hypovolemia [facility])    Family history of stomach cancer    Exercise-induced leg cramps 05/13/2017   Elevated alkaline phosphatase level 03/13/2015   Hyperlipidemia    Hypertension     Conditions to be addressed/monitored:HTN and quadraplegia, sacral ulcer, trach in place   Care Plan : RNCM: Hypertension (Adult)  Updates made by Vanita Ingles since 11/08/2020 12:00 AM    Problem: RNCM: Hypertension (Hypertension)   Priority: Medium    Goal: RNCM: Hypertension Monitored   Priority: Medium  Note:    Objective:   Last practice recorded BP readings:   BP Readings from Last 3 Encounters:   10/23/20  105/71   10/06/20  (!) 149/84   09/28/20  (!) 153/82      Most recent eGFR/CrCl: No results found for: EGFR  No components found for: CRCL Current Barriers:   Knowledge Deficits related to basic understanding of hypertension pathophysiology and self care management  Knowledge Deficits related to understanding of medications prescribed for management of hypertension  Difficulty obtaining medications  Transportation barriers  Limited Social Support  Unable to independently manage HTN  Unable to self administer medications as prescribed  Lacks social connections  Fear due to complex medical conditions and complex care needs- the wife calls the office frequently for follow up and questions about changes in health  Case Manager Clinical Goal(s):   Over the next 120 days, patient will verbalize understanding of plan for hypertension management  Over the next 120 days, patient will attend all scheduled medical appointments: At wound clinic today 11-08-2020, sees  pcp on 11-23-2020 at 1 pm  Over the next 120 days, patient will demonstrate improved adherence to prescribed treatment plan for hypertension as evidenced by taking all medications as prescribed, monitoring and recording blood pressure as directed, adhering to low sodium/DASH diet  Over the next 120 days, patient will demonstrate improved health management independence as evidenced by checking blood pressure as directed and notifying PCP if SBP>160 or DBP > 90, taking all medications as prescribe, and adhering to a low sodium diet as discussed.  Over the next 120 days, patient will verbalize basic understanding of hypertension disease process and self health management plan as evidenced by compliance with plan of care, and working with CCM team to optimize health and well being.  Interventions:   Evaluation of current  treatment plan related to hypertension self management and patient's adherence to plan as established by provider.  Provided education to patient re: stroke prevention, s/s of heart attack and stroke, DASH diet, complications of uncontrolled blood pressure  Reviewed medications with patient and discussed importance of compliance. 11-08-2020: The wife verbalized compliance of medications   Discussed plans with patient for ongoing care management follow up and provided patient with direct contact information for care management team  Advised patient, providing education and rationale, to monitor blood pressure daily and record, calling PCP for findings outside established parameters.  Patient Goals/Self-Care Activities  Over the next 120 days, patient will:  - Self administers medications as prescribed Attends all scheduled provider appointments Calls provider office for new concerns, questions, or BP outside discussed parameters Checks BP and records as discussed Follows a low sodium diet/DASH diet - blood pressure trends reviewed - depression screen reviewed - home or ambulatory blood pressure monitoring encouraged Follow Up Plan: Telephone follow up appointment with care management team member scheduled for: 01-09-2021 at 11:45 am   Care Plan : RNCM: Management of limited mobility: Quadriplegia  Updates made by Vanita Ingles since 11/08/2020 12:00 AM    Problem: RNCM: Management of Quadriplegia Coping Skills (General Plan of Care)   Priority: High    Goal: RNCM: Management of Quadriplegia   Priority: High  Note:   Current Barriers:   Knowledge Deficits related to caring for patient with quadriplegia   Care Coordination needs related to resources in the community and support for family in a patient with limited mobility/quadriplegia  Chronic Disease Management support and education needs related to patient with quadriplegia  Lacks caregiver support.   Transportation  barriers  Difficulty obtaining medications  Unable to independently manage mobility  Unable to self administer medications as prescribed  Lacks social connections  Nurse Case Manager Clinical Goal(s):   Over the next 120 days, patient will verbalize understanding of plan for optimal care for patient with quadriplegia   Over the next 120 days, patient will work with [facility] team to address needs related to meeting the needs of the patient with limited mobility and quadriplegia   Interventions:   1:1 collaboration with Valerie Roys, DO regarding development and update of comprehensive plan of care as evidenced by provider attestation and co-signature  Inter-disciplinary care team collaboration (see longitudinal plan of care)  Evaluation of current treatment plan related to limited mobility and quadriplegia  and patient's adherence to plan as established by provider.  Advised patient to call the office for changes in condition and needs.  The patient's wife states that the patient is 6'5" and the hospital bed he has is too short. The wife did  ask about another bed. Will collaborate with the pcp about new order for a bed that is longer. 10-02-2020: The patients wife had left a VM on 09-29-2020 asking about a bed extension. Cal to [facility] and spoke with Lillia Mountain with [facility]. She was ordering the extension and would have it delivered to the home. Information provided to the patient's wife today that the extension had been ordered and would be delivered to the home when it was available. The wife verbalized understanding.   Provided education to patient re: caregiver stress and use of resources to help with caregiver burnout in the care of patient with quadriplegia.  Education also on getting the CCM team LCSW and pharmacist to help with expressed needs and support. Education on talking to the [facility] nurse about swallowing evaluation and working with the OT/PT in the home  setting.  Ask the wife to write down questions to ask the home health nurse for needs that will help optimize care in the home. 10-02-2020: Collaboration with pcp concerning speech evaluation. The patient has had one in October and November of 2021. Recommendation for therapy to strengthen muscles in throat. Education given to the patients wife. The patients wife feels therapy would be beneficial for the patient. 11-08-2020: The patient was at the wound clinic at the time of the call. The patients wife states he is stable and doing well. He will see Dr. Leonarda Salon for rehabilitation on January 01, 2021.  Reviewed medications with patient and discussed compliance  Discussed plans with patient for ongoing care management follow up and provided patient with direct contact information for care management team  Provided patient with stress reduction educational materials related to caregiver stress and burnout with the care of a quadriplegia patient   Patient Goals/Self-Care Activities Over the next 120days, patient will:  - Patient will attend all scheduled provider appointments Patient will call provider office for new concerns or questions Patient will work with BSW to address care coordination needs and will continue to work with the clinical team to address health care and disease management related needs.   - blood pressure trends reviewed - depression screen reviewed - home or ambulatory blood pressure monitoring encouraged Follow Up Plan: Telephone follow up appointment with care management team member scheduled for: 01-09-2021 at 11:45 am       Care Plan : RNCM: Management of Skin Integrity  Updates made by Vanita Ingles since 11/08/2020 12:00 AM    Problem: RNCM: Management Skin Integrity as evidence by pressure ulcer with wound vac, trach, peg tube, and eschar bilateral heels   Priority: High    Goal: RNCM: Skin Integrity Maintained   Priority: High  Note:   Current Barriers:    Knowledge Deficits related to resources available in the community to help with meeting the needs of the patient with skin breakdown and quadriplegia   Care Coordination needs related to community resources  in a patient with quadriplegia and impaired skin integrity   Chronic Disease Management support and education needs related to impaired skin integrity and pressure ulcerations   Lacks caregiver support.   Film/video editor.   Transportation barriers  Difficulty obtaining medications  Unable to independently manage care as being dependent due to fall with injury in August of 2021 resulting in [place]   Unable to perform ADLs independently  Unable to perform IADLs independently  Nurse Case Manager Clinical Goal(s):   Over the next 120 days, patient will verbalize understanding of  plan for maintaining skin integrity with no further skin break down due to mobility   Over the next 120 days, patient will work with CCM team, pcp, and specialist  to address needs related to quadriplegic state and current state of skin break down with the potential for new break down   Over the next 120 days, patient will attend all scheduled medical appointments: at wound clinic today (11-08-2020), pcp on 11-23-2020 and specialist on 01-01-2021  Over the next 120 days, patient will demonstrate improved health management independence as evidenced byworking the PT/OT to possibly gain some mobility in hands and arms  Over the next 120 days, patient will work with CM team pharmacist to assist with medication cost and finding alternative pharmacy to meet medication needs  Over the next 120 days, patient will work with CM clinical social worker to assist with coping mechanisms due to traumatic event of fall in August 2021 with resulting quadriplegia   Interventions:   1:1 collaboration with Valerie Roys, DO regarding development and update of comprehensive plan of care as evidenced by provider  attestation and co-signature  Inter-disciplinary care team collaboration (see longitudinal plan of care)  Evaluation of current treatment plan related to pressure ulcers, trach care, peg tube care, and bilateral feet ulcerations and patient's adherence to plan as established by provider.  Advised patient to call the office for changes in condition, worsening pressure areas, the need to help facilitate supplies, new concerns or questions. 1-10-202: The patients wife ask about the virtual appointment that was set up through [facility] and if she needed to cancel the appointment with the wound provider on Wednesday. Advised the patients wife to keep the appointment on Wednesday with the specialist. 11-08-2020: The wife states that the patients has what he needs at this time but will call for any new questions or concerns.   Provided education to patient re: adequate nutrition and working with home health to optimize care in the home setting.   Reviewed medications with patient and discussed compliance. The patients wife ask about assistance with baclofen cost and also for a resource to get fluoxetine. 11-08-2020: The patient is working with pharmacist for expressed needs   Collaborated with CCM team and pcp regarding wife's expressed needs for the patient  Discussed plans with patient for ongoing care management follow up and provided patient with direct contact information for care management team  Provided patient with nutritional  educational materials related to helping maintain skin integrity   Reviewed scheduled/upcoming provider appointments including: 11-23-2020 at 1 pm  Social Work referral for help with coping after traumatic injury  Pharmacy referral for help with medications cost and medications needs   Patient Goals/Self-Care Activities Over the next 120 days, patient will:  - Patient will attend all scheduled provider appointments Patient will call pharmacy for medication refills Patient  will call provider office for new concerns or questions Patient will work with BSW to address care coordination needs and will continue to work with the clinical team to address health care and disease management related needs.   - adherence to skin care regimen encouraged - gentle skin cleansing encouraged - skin assessed  Follow Up Plan: Telephone follow up appointment with care management team member scheduled for: 01-09-2021 at 11:45 am         Plan:Telephone follow up appointment with care management team member scheduled for:  01-09-2021 at 11:45 am  [place], MSN, [place]  Location manager Mobile: [phone]

## 2020-11-08 NOTE — Patient Instructions (Signed)
Visit Information  PATIENT GOALS: Patient Care Plan: RNCM: Hypertension (Adult)    Problem Identified: RNCM: Hypertension (Hypertension)   Priority: Medium    Goal: RNCM: Hypertension Monitored   Priority: Medium  Note:   Objective:  . Last practice recorded BP readings:  . BP Readings from Last 3 Encounters: .  10/23/20 . 105/71 .  10/06/20 . (!) 149/84 .  09/28/20 . (!) 153/82 .    Marland Kitchen Most recent eGFR/CrCl: No results found for: EGFR  No components found for: CRCL Current Barriers:  Marland Kitchen Knowledge Deficits related to basic understanding of hypertension pathophysiology and self care management . Knowledge Deficits related to understanding of medications prescribed for management of hypertension . Difficulty obtaining medications . Transportation barriers . Limited Social Support . Unable to independently manage HTN . Unable to self administer medications as prescribed . Lacks social connections . Fear due to complex medical conditions and complex care needs- the wife calls the office frequently for follow up and questions about changes in health  Case Manager Clinical Goal(s):  Marland Kitchen Over the next 120 days, patient will verbalize understanding of plan for hypertension management . Over the next 120 days, patient will attend all scheduled medical appointments: At wound clinic today 11-08-2020, sees pcp on 11-23-2020 at 1 pm . Over the next 120 days, patient will demonstrate improved adherence to prescribed treatment plan for hypertension as evidenced by taking all medications as prescribed, monitoring and recording blood pressure as directed, adhering to low sodium/DASH diet . Over the next 120 days, patient will demonstrate improved health management independence as evidenced by checking blood pressure as directed and notifying PCP if SBP>160 or DBP > 90, taking all medications as prescribe, and adhering to a low sodium diet as discussed. . Over the next 120 days, patient will verbalize basic  understanding of hypertension disease process and self health management plan as evidenced by compliance with plan of care, and working with CCM team to optimize health and well being.  Interventions:  . Evaluation of current treatment plan related to hypertension self management and patient's adherence to plan as established by provider. . Provided education to patient re: stroke prevention, s/s of heart attack and stroke, DASH diet, complications of uncontrolled blood pressure . Reviewed medications with patient and discussed importance of compliance. 11-08-2020: The wife verbalized compliance of medications  . Discussed plans with patient for ongoing care management follow up and provided patient with direct contact information for care management team . Advised patient, providing education and rationale, to monitor blood pressure daily and record, calling PCP for findings outside established parameters.  Patient Goals/Self-Care Activities . Over the next 120 days, patient will:  - Self administers medications as prescribed Attends all scheduled provider appointments Calls provider office for new concerns, questions, or BP outside discussed parameters Checks BP and records as discussed Follows a low sodium diet/DASH diet - blood pressure trends reviewed - depression screen reviewed - home or ambulatory blood pressure monitoring encouraged Follow Up Plan: Telephone follow up appointment with care management team member scheduled for: 01-09-2021 at 11:45 am   Task: RNCM: Identify and Monitor Blood Pressure Elevation   Note:   Care Management Activities:    - blood pressure trends reviewed - depression screen reviewed - home or ambulatory blood pressure monitoring encouraged       Patient Care Plan: RNCM: Management of limited mobility: Quadriplegia    Problem Identified: RNCM: Management of Quadriplegia Coping Skills (General Plan of Care)  Priority: High    Goal: RNCM: Management  of Quadriplegia   Priority: High  Note:   Current Barriers:  Marland Kitchen Knowledge Deficits related to caring for patient with quadriplegia  . Care Coordination needs related to resources in the community and support for family in a patient with limited mobility/quadriplegia . Chronic Disease Management support and education needs related to patient with quadriplegia . Lacks caregiver support.  . Transportation barriers . Difficulty obtaining medications . Unable to independently manage mobility . Unable to self administer medications as prescribed . Lacks Government social research officer Case Manager Clinical Goal(s):  Marland Kitchen Over the next 120 days, patient will verbalize understanding of plan for optimal care for patient with quadriplegia  . Over the next 120 days, patient will work with [facility] [place] team to address needs related to meeting the needs of the patient with limited mobility and quadriplegia   Interventions:  . 1:1 collaboration with Valerie Roys, DO regarding development and update of comprehensive plan of care as evidenced by provider attestation and co-signature . Inter-disciplinary care team collaboration (see longitudinal plan of care) . Evaluation of current treatment plan related to limited mobility and quadriplegia  and patient's adherence to plan as established by provider. . Advised patient to call the office for changes in condition and needs.  The patient's wife states that the patient is 6'5" and the hospital bed he has is too short. The wife did ask about another bed. Will collaborate with the pcp about new order for a bed that is longer. 10-02-2020: The patients wife had left a VM on 09-29-2020 asking about a bed extension. Cal to [facility] and spoke with Lillia Mountain with [facility]. She was ordering the extension and would have it delivered to the home. Information provided to the patient's wife today that the extension had been ordered and would be delivered to the home when  it was available. The wife verbalized understanding.  . Provided education to patient re: caregiver stress and use of resources to help with caregiver burnout in the care of patient with quadriplegia.  Education also on getting the CCM team LCSW and pharmacist to help with expressed needs and support. Education on talking to the [facility] nurse about swallowing evaluation and working with the OT/PT in the home setting.  Ask the wife to write down questions to ask the home health nurse for needs that will help optimize care in the home. 10-02-2020: Collaboration with pcp concerning speech evaluation. The patient has had one in October and November of 2021. Recommendation for therapy to strengthen muscles in throat. Education given to the patients wife. The patients wife feels therapy would be beneficial for the patient. 11-08-2020: The patient was at the wound clinic at the time of the call. The patients wife states he is stable and doing well. He will see Dr. Leonarda Salon for rehabilitation on January 01, 2021. Marland Kitchen Reviewed medications with patient and discussed compliance . Discussed plans with patient for ongoing care management follow up and provided patient with direct contact information for care management team . Provided patient with stress reduction educational materials related to caregiver stress and burnout with the care of a quadriplegia patient   Patient Goals/Self-Care Activities Over the next 120days, patient will:  - Patient will attend all scheduled provider appointments Patient will call provider office for new concerns or questions Patient will work with BSW to address care coordination needs and will continue to work with the clinical team  to address health care and disease management related needs.   - blood pressure trends reviewed - depression screen reviewed - home or ambulatory blood pressure monitoring encouraged Follow Up Plan: Telephone follow up appointment with care management team member  scheduled for: 01-09-2021 at 11:45 am       Task: RNCM: Support Psychosocial Response to Risk or Actual Health Condition   Note:   Care Management Activities:    - active listening utilized - caregiver stress acknowledged - counseling provided - current coping strategies identified - decision-making supported - healthy lifestyle promoted - mindfulness encouraged - participation in counseling encouraged - problem-solving facilitated - relaxation techniques promoted - self-reflection promoted - verbalization of feelings encouraged       Patient Care Plan: RNCM: Management of Skin Integrity    Problem Identified: RNCM: Management Skin Integrity as evidence by pressure ulcer with wound vac, trach, peg tube, and eschar bilateral heels   Priority: High    Goal: RNCM: Skin Integrity Maintained   Priority: High  Note:   Current Barriers:  Marland Kitchen Knowledge Deficits related to resources available in the community to help with meeting the needs of the patient with skin breakdown and quadriplegia  . Care Coordination needs related to community resources  in a patient with quadriplegia and impaired skin integrity  . Chronic Disease Management support and education needs related to impaired skin integrity and pressure ulcerations  . Lacks caregiver support.  . Film/video editor.  . Transportation barriers . Difficulty obtaining medications . Unable to independently manage care as being dependent due to fall with injury in August of 2021 resulting in [place]  . Unable to perform ADLs independently . Unable to perform IADLs independently  Nurse Case Manager Clinical Goal(s):  Marland Kitchen Over the next 120 days, patient will verbalize understanding of plan for maintaining skin integrity with no further skin break down due to mobility  . Over the next 120 days, patient will work with CCM team, pcp, and specialist  to address needs related to quadriplegic state and current state of skin break down  with the potential for new break down  . Over the next 120 days, patient will attend all scheduled medical appointments: at wound clinic today (11-08-2020), pcp on 11-23-2020 and specialist on 01-01-2021 . Over the next 120 days, patient will demonstrate improved health management independence as evidenced byworking the PT/OT to possibly gain some mobility in hands and arms . Over the next 120 days, patient will work with CM team pharmacist to assist with medication cost and finding alternative pharmacy to meet medication needs . Over the next 120 days, patient will work with CM clinical social worker to assist with coping mechanisms due to traumatic event of fall in August 2021 with resulting quadriplegia   Interventions:  . 1:1 collaboration with Valerie Roys, DO regarding development and update of comprehensive plan of care as evidenced by provider attestation and co-signature . Inter-disciplinary care team collaboration (see longitudinal plan of care) . Evaluation of current treatment plan related to pressure ulcers, trach care, peg tube care, and bilateral feet ulcerations and patient's adherence to plan as established by provider. . Advised patient to call the office for changes in condition, worsening pressure areas, the need to help facilitate supplies, new concerns or questions. 1-10-202: The patients wife ask about the virtual appointment that was set up through [facility] and if she needed to cancel the appointment with the wound provider on Wednesday. Advised the patients wife to keep the  appointment on Wednesday with the specialist. 11-08-2020: The wife states that the patients has what he needs at this time but will call for any new questions or concerns.  . Provided education to patient re: adequate nutrition and working with home health to optimize care in the home setting.  . Reviewed medications with patient and discussed compliance. The patients wife ask about assistance with baclofen cost and  also for a resource to get fluoxetine. 11-08-2020: The patient is working with pharmacist for expressed needs  . Collaborated with CCM team and pcp regarding wife's expressed needs for the patient . Discussed plans with patient for ongoing care management follow up and provided patient with direct contact information for care management team . Provided patient with nutritional  educational materials related to helping maintain skin integrity  . Reviewed scheduled/upcoming provider appointments including: 11-23-2020 at 1 pm . Social Work referral for help with coping after traumatic injury . Pharmacy referral for help with medications cost and medications needs   Patient Goals/Self-Care Activities Over the next 120 days, patient will:  - Patient will attend all scheduled provider appointments Patient will call pharmacy for medication refills Patient will call provider office for new concerns or questions Patient will work with BSW to address care coordination needs and will continue to work with the clinical team to address health care and disease management related needs.   - adherence to skin care regimen encouraged - gentle skin cleansing encouraged - skin assessed  Follow Up Plan: Telephone follow up appointment with care management team member scheduled for: 01-09-2021 at 11:45 am       Task: RNCM: Support Skin Integrity   Note:   Care Management Activities:    - adherence to skin care regimen encouraged - gentle skin cleansing encouraged - skin assessed         Patient verbalizes understanding of instructions provided today and agrees to view in [place].   Telephone follow up appointment with care management team member scheduled for: 01-09-2021 at 11:45 am  Noreene Larsson RN, MSN, [facility] Mobile: [phone]

## 2020-11-09 ENCOUNTER — Telehealth: Payer: Self-pay

## 2020-11-09 NOTE — Telephone Encounter (Signed)
Order is already in the computer.

## 2020-11-09 NOTE — Telephone Encounter (Signed)
Copied from [place] [phone]. Topic: General - Other >> Nov 09, 2020  8:57 AM Alanda Slim E wrote: Reason for CRM: Pt was advised to do another urine culture in 7 days from his last/ pt called to see when he needs to do so and stsed he hasnt heard anything from office/ please advise   Per lab note "Complete course of antibiotics and increase fluids. Bring repeat urine sample 7 days after completing course of antibiotics to ensure infection has cleared" Can orders be placed for this?

## 2020-11-09 NOTE — Progress Notes (Signed)
TEVON, BERHANE ([id number]) Visit Report for 11/08/2020 Arrival Information Details Patient Name: Ryan Day, Ryan Day Date of Service: 11/08/2020 11:00 AM Medical Record Number: [id number] Patient Account Number: [id number] Date of Birth/Sex: [birth year] (70 y.o. M) Treating RN: Dolan Amen Primary Care Analese Sovine: Charlynne Cousins Other Clinician: Referring Mouna Yager: Park Liter Treating Jaxyn Mestas/Extender: Tito Dine in Treatment: 5 Visit Information History Since Last Visit Pain Present Now: No Patient Arrived: Stretcher Arrival Time: 11:38 Accompanied By: wife Transfer Assistance: Stretcher Patient Identification Verified: Yes Secondary Verification Process Completed: Yes Patient Requires Transmission-Based Precautions: No Patient Has Alerts: Yes Patient Alerts: xalerto NOT DIABETIC Electronic Signature(s) Signed: 11/09/2020 12:42:05 PM By: Georges Mouse, Minus Breeding RN Entered By: Georges Mouse, Minus Breeding on 11/08/2020 11:39:20 [place], [place] ([id number]) -------------------------------------------------------------------------------- Lower Extremity Assessment Details Patient Name: Ryan Day Date of Service: 11/08/2020 11:00 AM Medical Record Number: [id number] Patient Account Number: [id number] Date of Birth/Sex: 07-22-1951 (69 y.o. M) Treating RN: Dolan Amen Primary Care Molleigh Huot: Charlynne Cousins Other Clinician: Referring Filippa Yarbough: Park Liter Treating Trenee Igoe/Extender: Ricard Dillon Weeks in Treatment: 5 Edema Assessment Assessed: [Left: Yes] [Right: Yes] Edema: [Left: Yes] [Right: Yes] Calf Left: Right: Point of Measurement: 48 cm From Medial Instep 37 cm 33 cm Ankle Left: Right: Point of Measurement: 12 cm From Medial Instep 24 cm 24 cm Electronic Signature(s) Signed: 11/09/2020 12:42:05 PM By: Georges Mouse, Minus Breeding RN Entered By: Georges Mouse, Minus Breeding on 11/08/2020 11:46:45 [place], [place]  ([id number]) -------------------------------------------------------------------------------- Multi Wound Chart Details Patient Name: Ryan Day Date of Service: 11/08/2020 11:00 AM Medical Record Number: [id number] Patient Account Number: [id number] Date of Birth/Sex: [birth year] (69 y.o. M) Treating RN: Cornell Barman Primary Care Braxon Suder: Charlynne Cousins Other Clinician: Referring Jemaine Prokop: Park Liter Treating Shem Plemmons/Extender: Tito Dine in Treatment: 5 Vital Signs Height(in): 77 Pulse(bpm): 84 Weight(lbs): 240 Blood Pressure(mmHg): 97/67 Body Mass Index(BMI): 28 Temperature(F): 98.3 Respiratory Rate(breaths/min): 20 Photos: Wound Location: Right Gluteus Sacrum Right Calcaneus Wounding Event: Pressure Injury Pressure Injury Pressure Injury Primary Etiology: Pressure Ulcer Pressure Ulcer Pressure Ulcer Comorbid History: Hypotension, Quadriplegia Hypotension, Quadriplegia Hypotension, Quadriplegia Date Acquired: 06/23/2020 06/23/2020 08/18/2020 Weeks of Treatment: 5 5 5  Wound Status: Open Open Open Measurements L x W x D (cm) 5x5.5x0.1 2x1.3x2.3 6x3x0.1 Area (cm) : 21.598 2.042 14.137 Volume (cm) : 2.16 4.697 1.414 % Reduction in Area: -1462.80% 49.20% -2901.50% % Reduction in Volume: -1465.20% 66.60% -2908.50% Starting Position 1 (o'clock): 12 Ending Position 1 (o'clock): 12 Maximum Distance 1 (cm): 3 Undermining: No Yes No Classification: Category/Stage II Category/Stage IV Category/Stage II Exudate Amount: Medium Medium Medium Exudate Type: Serosanguineous Serosanguineous Serosanguineous Exudate Color: red, brown red, brown red, brown Wound Margin: N/A Flat and Intact N/A Granulation Amount: Large (67-100%) Medium (34-66%) Medium (34-66%) Granulation Quality: Pink Pink Red Necrotic Amount: Small (1-33%) Medium (34-66%) Medium (34-66%) Necrotic Tissue: Adherent Slough N/A Eschar Exposed Structures: Fat Layer (Subcutaneous Tissue): Fat Layer (Subcutaneous  Tissue): Fat Layer (Subcutaneous Tissue): Yes Yes Yes Fascia: No Fascia: No Fascia: No Tendon: No Tendon: No Tendon: No Muscle: No Muscle: No Muscle: No Joint: No Joint: No Joint: No Bone: No Bone: No Bone: No Epithelialization: None None None Debridement: N/A N/A N/A Tissue Debrided: N/A N/A N/A Level: N/A N/A N/A Debridement Area (sq cm): N/A N/A N/A Instrument: N/A N/A N/A Bleeding: N/A N/A N/A Hemostasis Achieved: N/A N/A N/A Debridement Treatment N/A N/A N/A ResponseKarsen Nakanishi, Wright ([id number]) Post Debridement N/A N/A N/A Measurements L x W x D (cm) Post Debridement Volume: N/A N/A N/A (cm) Post Debridement Stage: N/A N/A N/A  Procedures Performed: N/A N/A N/A Wound Number: 4 6 N/A Photos: N/A Wound Location: Left Calcaneus Left Toe Great N/A Wounding Event: Pressure Injury Pressure Injury N/A Primary Etiology: Pressure Ulcer Pressure Ulcer N/A Comorbid History: Hypotension, Quadriplegia Hypotension, Quadriplegia N/A Date Acquired: 08/18/2020 08/18/2020 N/A Weeks of Treatment: 5 5 N/A Wound Status: Open Open N/A Measurements L x W x D (cm) 5x6x0.1 0x0x0 N/A Area (cm) : 23.562 0 N/A Volume (cm) : 2.356 0 N/A % Reduction in Area: -50.00% 100.00% N/A % Reduction in Volume: -50.00% 100.00% N/A Undermining: No No N/A Classification: Category/Stage II Category/Stage II N/A Exudate Amount: None Present None Present N/A Exudate Type: N/A N/A N/A Exudate Color: N/A N/A N/A Wound Margin: Flat and Intact Flat and Intact N/A Granulation Amount: None Present (0%) None Present (0%) N/A Granulation Quality: N/A N/A N/A Necrotic Amount: Large (67-100%) None Present (0%) N/A Necrotic Tissue: Eschar N/A N/A Exposed Structures: Fat Layer (Subcutaneous Tissue): Fascia: No N/A Yes Fat Layer (Subcutaneous Tissue): Fascia: No No Tendon: No Tendon: No Muscle: No Muscle: No Joint: No Joint: No Bone: No Bone: No Epithelialization: None Large (67-100%)  N/A Debridement: Debridement - Selective/Open N/A N/A Wound Pre-procedure Verification/Time 12:10 N/A N/A Out Taken: Tissue Debrided: Necrotic/Eschar N/A N/A Level: Non-Viable Tissue N/A N/A Debridement Area (sq cm): 30 N/A N/A Instrument: Blade, Forceps N/A N/A Bleeding: Moderate N/A N/A Hemostasis Achieved: Silver Nitrate N/A N/A Debridement Treatment Procedure was tolerated well N/A N/A Response: Post Debridement 5x6x0.4 N/A N/A Measurements L x W x D (cm) Post Debridement Volume: 9.425 N/A N/A (cm) Post Debridement Stage: Category/Stage II N/A N/A Procedures Performed: Debridement N/A N/A Treatment Notes Electronic Signature(s) Signed: 11/09/2020 12:09:37 PM By: Linton Ham MD [place], [place] ([id number]) Entered By: Linton Ham on 11/08/2020 12:16:27 Ryan Day ([id number]) -------------------------------------------------------------------------------- Multi-Disciplinary Care Plan Details Patient Name: Ryan Day Date of Service: 11/08/2020 11:00 AM Medical Record Number: [id number] Patient Account Number: [id number] Date of Birth/Sex: [birth year] (69 y.o. M) Treating RN: Cornell Barman Primary Care Landon Bassford: Charlynne Cousins Other Clinician: Referring Ladarrion Telfair: Park Liter Treating Cayleigh Paull/Extender: Tito Dine in Treatment: 5 Active Inactive Wound/Skin Impairment Nursing Diagnoses: Impaired tissue integrity Goals: Patient/caregiver will verbalize understanding of skin care regimen Date Initiated: 10/04/2020 Target Resolution Date: 11/04/2020 Goal Status: Active Ulcer/skin breakdown will have a volume reduction of 30% by week 4 Date Initiated: 10/04/2020 Target Resolution Date: 11/04/2020 Goal Status: Active Interventions: Assess ulceration(s) every visit Treatment Activities: Skin care regimen initiated : 10/04/2020 Topical wound management initiated : 10/04/2020 Notes: Electronic Signature(s) Signed: 11/08/2020 9:26:32 PM By: Gretta Cool, BSN, RN, CWS,  Kim RN, BSN Entered By: Gretta Cool, BSN, RN, CWS, Kim on 11/08/2020 12:11:15 [place], Adolf ([id number]) -------------------------------------------------------------------------------- Pain Assessment Details Patient Name: Ryan Day Date of Service: 11/08/2020 11:00 AM Medical Record Number: [id number] Patient Account Number: [id number] Date of Birth/Sex: Feb 22, 1951 (69 y.o. M) Treating RN: Dolan Amen Primary Care Gleason Ardoin: Charlynne Cousins Other Clinician: Referring Decarlos Empey: Park Liter Treating Seferino Oscar/Extender: Tito Dine in Treatment: 5 Active Problems Location of Pain Severity and Description of Pain Patient Has Paino No Site Locations Rate the pain. Current Pain Level: 0 Pain Management and Medication Current Pain Management: Electronic Signature(s) Signed: 11/09/2020 12:42:05 PM By: Georges Mouse, Minus Breeding RN Entered By: Georges Mouse, Minus Breeding on 11/08/2020 11:39:47 [place], Gurshan ([id number]) -------------------------------------------------------------------------------- Patient/Caregiver Education Details Patient Name: Ryan Day Date of Service: 11/08/2020 11:00 AM Medical Record Number: [id number] Patient Account Number: [id number] Date of Birth/Gender: December 15, 1950 (69 y.o. M) Treating RN: Cornell Barman Primary Care Physician: Neomia Dear,  AVANTI Other Clinician: Referring Physician: Park Liter Treating Physician/Extender: Tito Dine in Treatment: 5 Education Assessment Education Provided To: Patient Education Topics Provided Offloading: Handouts: What is Offloadingo, Other: Float heels, rotate side to side every 2 hours. Methods: Demonstration Responses: State content correctly Wound Debridement: Handouts: Wound Debridement Methods: Demonstration, Explain/Verbal Responses: State content correctly Electronic Signature(s) Signed: 11/08/2020 9:26:32 PM By: Gretta Cool, BSN, RN, CWS, Kim RN, BSN Entered By: Gretta Cool, BSN, RN, CWS, Kim on 11/08/2020  12:23:43 Ryan Day ([id number]) -------------------------------------------------------------------------------- Wound Assessment Details Patient Name: Ryan Day Date of Service: 11/08/2020 11:00 AM Medical Record Number: [id number] Patient Account Number: [id number] Date of Birth/Sex: 1951-02-03 (69 y.o. M) Treating RN: Dolan Amen Primary Care Colt Martelle: Charlynne Cousins Other Clinician: Referring Elbert Spickler: Park Liter Treating Maleiah Dula/Extender: Ricard Dillon Weeks in Treatment: 5 Wound Status Wound Number: 1 Primary Etiology: Pressure Ulcer Wound Location: Right Gluteus Wound Status: Open Wounding Event: Pressure Injury Comorbid History: Hypotension, Quadriplegia Date Acquired: 06/23/2020 Weeks Of Treatment: 5 Clustered Wound: No Photos Wound Measurements Length: (cm) 5 Width: (cm) 5.5 Depth: (cm) 0.1 Area: (cm) 21.598 Volume: (cm) 2.16 % Reduction in Area: -1462.8% % Reduction in Volume: -1465.2% Epithelialization: None Tunneling: No Undermining: No Wound Description Classification: Category/Stage II Exudate Amount: Medium Exudate Type: Serosanguineous Exudate Color: red, brown Foul Odor After Cleansing: No Slough/Fibrino Yes Wound Bed Granulation Amount: Large (67-100%) Exposed Structure Granulation Quality: Pink Fascia Exposed: No Necrotic Amount: Small (1-33%) Fat Layer (Subcutaneous Tissue) Exposed: Yes Necrotic Quality: Adherent Slough Tendon Exposed: No Muscle Exposed: No Joint Exposed: No Bone Exposed: No Electronic Signature(s) Signed: 11/09/2020 12:42:05 PM By: Georges Mouse, Minus Breeding RN Entered By: Georges Mouse, Minus Breeding on 11/08/2020 11:44:20 [place], Zackry ([id number]) -------------------------------------------------------------------------------- Wound Assessment Details Patient Name: Ryan Day Date of Service: 11/08/2020 11:00 AM Medical Record Number: [id number] Patient Account Number: [id number] Date of Birth/Sex: 1950/11/08 (69  y.o. M) Treating RN: Cornell Barman Primary Care Cassiel Fernandez: Charlynne Cousins Other Clinician: Referring Bryleigh Ottaway: Park Liter Treating Clotilda Hafer/Extender: Tito Dine in Treatment: 5 Wound Status Wound Number: 2 Primary Etiology: Pressure Ulcer Wound Location: Sacrum Wound Status: Open Wounding Event: Pressure Injury Comorbid History: Hypotension, Quadriplegia Date Acquired: 06/23/2020 Weeks Of Treatment: 5 Clustered Wound: No Photos Wound Measurements Length: (cm) 2 Width: (cm) 1.3 Depth: (cm) 2.3 Area: (cm) 2.042 Volume: (cm) 4.697 % Reduction in Area: 49.2% % Reduction in Volume: 66.6% Epithelialization: None Tunneling: No Undermining: Yes Starting Position (o'clock): 12 Ending Position (o'clock): 12 Maximum Distance: (cm) 3 Wound Description Classification: Category/Stage IV Wound Margin: Flat and Intact Exudate Amount: Medium Exudate Type: Serosanguineous Exudate Color: red, brown Foul Odor After Cleansing: No Slough/Fibrino Yes Wound Bed Granulation Amount: Medium (34-66%) Exposed Structure Granulation Quality: Pink Fascia Exposed: No Necrotic Amount: Medium (34-66%) Fat Layer (Subcutaneous Tissue) Exposed: Yes Tendon Exposed: No Muscle Exposed: No Joint Exposed: No Bone Exposed: No Electronic Signature(s) Signed: 11/08/2020 9:26:32 PM By: Gretta Cool, BSN, RN, CWS, Kim RN, BSN Entered By: Gretta Cool, BSN, RN, CWS, Kim on 11/08/2020 12:06:36 [place], [place] ([id number]) -------------------------------------------------------------------------------- Wound Assessment Details Patient Name: Ryan Day Date of Service: 11/08/2020 11:00 AM Medical Record Number: [id number] Patient Account Number: [id number] Date of Birth/Sex: Sep 15, 1951 (69 y.o. M) Treating RN: Dolan Amen Primary Care Jarek Longton: Charlynne Cousins Other Clinician: Referring Oaklan Persons: Park Liter Treating Jenasis Straley/Extender: Ricard Dillon Weeks in Treatment: 5 Wound Status Wound Number:  3 Primary Etiology: Pressure Ulcer Wound Location: Right Calcaneus Wound Status: Open Wounding Event: Pressure Injury Comorbid History: Hypotension, Quadriplegia Date Acquired: 08/18/2020 Weeks Of Treatment: 5 Clustered  Wound: No Photos Wound Measurements Length: (cm) 6 Width: (cm) 3 Depth: (cm) 0.1 Area: (cm) 14.137 Volume: (cm) 1.414 % Reduction in Area: -2901.5% % Reduction in Volume: -2908.5% Epithelialization: None Tunneling: No Undermining: No Wound Description Classification: Category/Stage II Exudate Amount: Medium Exudate Type: Serosanguineous Exudate Color: red, brown Foul Odor After Cleansing: No Slough/Fibrino No Wound Bed Granulation Amount: Medium (34-66%) Exposed Structure Granulation Quality: Red Fascia Exposed: No Necrotic Amount: Medium (34-66%) Fat Layer (Subcutaneous Tissue) Exposed: Yes Necrotic Quality: Eschar Tendon Exposed: No Muscle Exposed: No Joint Exposed: No Bone Exposed: No Electronic Signature(s) Signed: 11/09/2020 12:42:05 PM By: Georges Mouse, Minus Breeding RN Entered By: Georges Mouse, Minus Breeding on 11/08/2020 11:45:18 [place], Dana ([id number]) -------------------------------------------------------------------------------- Wound Assessment Details Patient Name: Ryan Day Date of Service: 11/08/2020 11:00 AM Medical Record Number: [id number] Patient Account Number: [id number] Date of Birth/Sex: 1951/07/24 (69 y.o. M) Treating RN: Dolan Amen Primary Care Gaige Sebo: Charlynne Cousins Other Clinician: Referring Kelee Cunningham: Park Liter Treating Eufemia Prindle/Extender: Ricard Dillon Weeks in Treatment: 5 Wound Status Wound Number: 4 Primary Etiology: Pressure Ulcer Wound Location: Left Calcaneus Wound Status: Open Wounding Event: Pressure Injury Comorbid History: Hypotension, Quadriplegia Date Acquired: 08/18/2020 Weeks Of Treatment: 5 Clustered Wound: No Photos Wound Measurements Length: (cm) 5 Width: (cm) 6 Depth: (cm) 0.1 Area:  (cm) 23.562 Volume: (cm) 2.356 % Reduction in Area: -50% % Reduction in Volume: -50% Epithelialization: None Tunneling: No Undermining: No Wound Description Classification: Category/Stage II Wound Margin: Flat and Intact Exudate Amount: None Present Foul Odor After Cleansing: No Slough/Fibrino No Wound Bed Granulation Amount: None Present (0%) Exposed Structure Necrotic Amount: Large (67-100%) Fascia Exposed: No Necrotic Quality: Eschar Fat Layer (Subcutaneous Tissue) Exposed: Yes Tendon Exposed: No Muscle Exposed: No Joint Exposed: No Bone Exposed: No Electronic Signature(s) Signed: 11/09/2020 12:42:05 PM By: Georges Mouse, Minus Breeding RN Entered By: Georges Mouse, Minus Breeding on 11/08/2020 11:45:45 [place], [place] ([id number]) -------------------------------------------------------------------------------- Wound Assessment Details Patient Name: Ryan Day Date of Service: 11/08/2020 11:00 AM Medical Record Number: [id number] Patient Account Number: [id number] Date of Birth/Sex: 25-May-1951 (69 y.o. M) Treating RN: Dolan Amen Primary Care Ragen Laver: Charlynne Cousins Other Clinician: Referring Alsha Meland: Park Liter Treating Ellizabeth Dacruz/Extender: Tito Dine in Treatment: 5 Wound Status Wound Number: 6 Primary Etiology: Pressure Ulcer Wound Location: Left Toe Great Wound Status: Open Wounding Event: Pressure Injury Comorbid History: Hypotension, Quadriplegia Date Acquired: 08/18/2020 Weeks Of Treatment: 5 Clustered Wound: No Photos Wound Measurements Length: (cm) 0 Width: (cm) 0 Depth: (cm) 0 Area: (cm) 0 Volume: (cm) 0 % Reduction in Area: 100% % Reduction in Volume: 100% Epithelialization: Large (67-100%) Tunneling: No Undermining: No Wound Description Classification: Category/Stage II Wound Margin: Flat and Intact Exudate Amount: None Present Foul Odor After Cleansing: No Slough/Fibrino No Wound Bed Granulation Amount: None Present (0%) Exposed  Structure Necrotic Amount: None Present (0%) Fascia Exposed: No Fat Layer (Subcutaneous Tissue) Exposed: No Tendon Exposed: No Muscle Exposed: No Joint Exposed: No Bone Exposed: No Electronic Signature(s) Signed: 11/09/2020 12:42:05 PM By: Georges Mouse, Minus Breeding RN Entered By: Georges Mouse, Minus Breeding on 11/08/2020 11:46:09 [place], [place] ([id number]) -------------------------------------------------------------------------------- Vitals Details Patient Name: Ryan Day Date of Service: 11/08/2020 11:00 AM Medical Record Number: [id number] Patient Account Number: [id number] Date of Birth/Sex: Jun 11, 1951 (69 y.o. M) Treating RN: Dolan Amen Primary Care Anias Bartol: Charlynne Cousins Other Clinician: Referring Ryonna Cimini: Park Liter Treating Stanislav Gervase/Extender: Tito Dine in Treatment: 5 Vital Signs Time Taken: 11:47 Temperature (F): 98.3 Height (in): 77 Pulse (bpm): 84 Weight (lbs): 240 Respiratory Rate (breaths/min): 20 Body Mass Index (BMI):  28.5 Blood Pressure (mmHg): 97/67 Reference Range: 80 - 120 mg / dl Electronic Signature(s) Signed: 11/09/2020 12:42:05 PM By: Georges Mouse, Minus Breeding RN Entered By: Georges Mouse, Minus Breeding on 11/08/2020 11:47:18

## 2020-11-09 NOTE — Telephone Encounter (Signed)
Called [name] pt's wife she is going to come and get a urine cup and bring back

## 2020-11-09 NOTE — Progress Notes (Signed)
KOBE, OFALLON ([id number]) Visit Report for 11/08/2020 Debridement Details Patient Name: DAVARION, CUFFEE Date of Service: 11/08/2020 11:00 AM Medical Record Number: [id number] Patient Account Number: [id number] Date of Birth/Sex: [birth year] (70 y.o. M) Treating RN: Cornell Barman Primary Care Provider: Charlynne Cousins Other Clinician: Referring Provider: Park Liter Treating Provider/Extender: Tito Dine in Treatment: 5 Debridement Performed for Wound #4 Left Calcaneus Assessment: Performed By: Physician Ricard Dillon, MD Debridement Type: Debridement Level of Consciousness (Pre- Responds to Verbal Stimuli procedure): Pre-procedure Verification/Time Out Yes - 12:10 Taken: Start Time: 12:10 Total Area Debrided (L x W): 5 (cm) x 6 (cm) = 30 (cm) Tissue and other material Non-Viable, Eschar debrided: Level: Non-Viable Tissue Debridement Description: Selective/Open Wound Instrument: Blade, Forceps Bleeding: Moderate Hemostasis Achieved: Silver Nitrate Response to Treatment: Procedure was tolerated well Level of Consciousness (Post- Awake and Alert procedure): Post Debridement Measurements of Total Wound Length: (cm) 5 Stage: Category/Stage II Width: (cm) 6 Depth: (cm) 0.4 Volume: (cm) 9.425 Character of Wound/Ulcer Post Debridement: Stable Post Procedure Diagnosis Same as Pre-procedure Electronic Signature(s) Signed: 11/08/2020 9:26:32 PM By: Gretta Cool, BSN, RN, CWS, Kim RN, BSN Signed: 11/09/2020 12:09:37 PM By: Linton Ham MD Entered By: Gretta Cool, BSN, RN, CWS, Kim on 11/08/2020 12:15:47 Wilmouth, Rafan ([id number]) -------------------------------------------------------------------------------- HPI Details Patient Name: [name] Date of Service: 11/08/2020 11:00 AM Medical Record Number: [id number] Patient Account Number: [id number] Date of Birth/Sex: 1950/12/02 (70 y.o. M) Treating RN: Cornell Barman Primary Care Provider: Charlynne Cousins Other Clinician: Referring  Provider: Park Liter Treating Provider/Extender: Tito Dine in Treatment: 5 History of Present Illness HPI Description: ADMISSION 10/04/2020 Mr. [name] is a 70 year old man who is here accompanied by his daughters. 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 He spent a long time in the neuro ICU after his surgical stabilization at [facility]. 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 I do not have much information about the start of this or how it progressed but he was readmitted to [facility] from 08/14/2020 through 08/27/2020 with an infected sacral pressure ulcer. He required a operative debridement by general surgery on 11/24. Wound culture grew Enterococcus and he is just completing 6 weeks of Zosyn under the tutelage of infectious disease. According to his daughters the heel wounds that he has started at that time although I am really not able to get much of a history on these. He came in with small areas on the right heel laterally and 2 on the left. They have been using a wound VAC on the sacrum with a foam cover over the small superficial area on the right buttock under the VAC seal. And I think foam on the heels as well as the great toe on the left. He has a level 2 surface. 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 His CT scan of the pelvis with contrast on 11/23 did show the large left sacral decubitus ulcer with evidence of sacral osteomyelitis. We have regular collagen under wound VAC. He also has a substantial area on the right heel and a more superficial area on the left. I debrided the right heel  last week it does not  look much better this week. He is definitely going to need ABIs on that side which we will arrange today We are able to document his ABIs today at 1.0 on the right and 1.01 on the left 2/16; we have made some improvement in the overall depth of the sacral wound. He has completed his antibiotics. Unfortunately home health [[facility]] has decided to withdraw from the case. The patient's daughters are changing the wound VAC both are nurses and work 12-hour shifts they are exhausted. We have been using the wound VAC to the area on the sacrum. Comes in with a new area that looks like an injury from lying on to being on the right buttock and right posterior hip. Is possible that the hip may become more ominous. Electronic Signature(s) Signed: 11/09/2020 12:09:37 PM By: Linton Ham MD Entered By: Linton Ham on 11/08/2020 12:33:37 Holian, Ladarrell ([id number]) -------------------------------------------------------------------------------- Physical Exam Details Patient Name: [name] Date of Service: 11/08/2020 11:00 AM Medical Record Number: [id number] Patient Account Number: [id number] Date of Birth/Sex: April 12, 1951 (70 y.o. M) Treating RN: Cornell Barman Primary Care Provider: Charlynne Cousins Other Clinician: Referring Provider: Park Liter Treating Provider/Extender: Tito Dine in Treatment: 5 Constitutional Patient is hypotensive. However appears well. Pulse regular and within target range for patient.Marland Kitchen Respirations regular, non-labored and within target range.. Temperature is normal and within the target range for the patient.Marland Kitchen appears in no distress. Cardiovascular Dorsalis pedis pulses are palpable bilaterally. Psychiatric Patient is responsive. Notes Wound exam oStage IV wound over the sacrum again undermined superiorly. The depth of this is come in somewhat. So has the undermining at roughly 12-3 o'clock however the orifice is come in more. He also  apparently has a VAC tube injury to the right buttock. All of his skin around the whole area looks somewhat dry and excoriated oThe left heel again now is substantial. Armed within normal ABIs from last week I went ahead and did debridement with a #10 scalpel and pickups. Begin the process of seeing if I can get a healthy surface here. oThe right heel is better but there is still some necrotic debris in the center I did not debride this Electronic Signature(s) Signed: 11/09/2020 12:09:37 PM By: Linton Ham MD Entered By: Linton Ham on 11/08/2020 12:35:49 Bordner, Amar ([id number]) -------------------------------------------------------------------------------- Physician Orders Details Patient Name: [name] Date of Service: 11/08/2020 11:00 AM Medical Record Number: [id number] Patient Account Number: [id number] Date of Birth/Sex: April 11, 1951 (70 y.o. M) Treating RN: Cornell Barman Primary Care Provider: Charlynne Cousins Other Clinician: Referring Provider: Park Liter Treating Provider/Extender: Tito Dine in Treatment: 5 Verbal / Phone Orders: No Diagnosis Coding ICD-10 Coding Code Description L89.154 Pressure ulcer of sacral region, stage 4 L89.312 Pressure ulcer of right buttock, stage 2 L89.610 Pressure ulcer of right heel, unstageable C94.496 Pressure ulcer of left heel, stage 2 S14.104D Unspecified injury at C4 level of cervical spinal cord, subsequent encounter Follow-up Appointments o Return Appointment in 2 weeks. [place] for wound care. May utilize formulary equivalent dressing for wound treatment orders unless otherwise specified. Home Health Nurse may visit PRN to address patientos wound care needs. - [facility] Off-Loading o Heel suspension boot - float heels on pillows as well o Turn and reposition every 2 hours Negative Pressure Wound Therapy Wound #2 Sacrum o Place NPWT on HOLD. - Hold for 2 weeks Wound  Treatment Wound #1 - Gluteus Wound Laterality: Right Primary Dressing: AquacelAg Advantage Dressing, 4x5 (in/in) Discharge Instructions: Apply to  wound as directed Secondary Dressing: ABD Pad 5x9 (in/in) Discharge Instructions: Cover with ABD pad Secured With: 61M Medipore H Soft Cloth Surgical Tape, 2x2 (in/yd) Wound #2 - Sacrum Primary Dressing: Gauze Discharge Instructions: packed lightly, moistened with hydrogel Secondary Dressing: ABD Pad 5x9 (in/in) Discharge Instructions: Cover with ABD pad Secured With: 61M Medipore H Soft Cloth Surgical Tape, 2x2 (in/yd) Wound #3 - Calcaneus Wound Laterality: Right Primary Dressing: AquacelAg Advantage Dressing, 2X2 (in/in) Discharge Instructions: Apply to wound as directed Secondary Dressing: ABD Pad 5x9 (in/in) Discharge Instructions: Cover with ABD pad Secured With: Kerlix Roll Sterile or Non-Sterile 6-ply 4.5x4 (yd/yd) Discharge Instructions: Apply Kerlix as directed Villavicencio, Donivan ([id number]) Wound #4 - Calcaneus Wound Laterality: Left Primary Dressing: AquacelAg Advantage Dressing, 2X2 (in/in) Discharge Instructions: Apply to wound as directed Secondary Dressing: ABD Pad 5x9 (in/in) Discharge Instructions: Cover with ABD pad Secured With: Kerlix Roll Sterile or Non-Sterile 6-ply 4.5x4 (yd/yd) Discharge Instructions: Apply Kerlix as directed Electronic Signature(s) Signed: 11/08/2020 9:26:32 PM By: Gretta Cool, BSN, RN, CWS, Kim RN, BSN Signed: 11/09/2020 12:09:37 PM By: Linton Ham MD Entered By: Gretta Cool, BSN, RN, CWS, Kim on 11/08/2020 12:22:01 COOR, Chayden ([id number]) -------------------------------------------------------------------------------- Problem List Details Patient Name: [name] Date of Service: 11/08/2020 11:00 AM Medical Record Number: [id number] Patient Account Number: [id number] Date of Birth/Sex: [birth year] (70 y.o. M) Treating RN: Cornell Barman Primary Care Provider: Charlynne Cousins Other Clinician: Referring Provider:  Park Liter Treating Provider/Extender: Tito Dine in Treatment: 5 Active Problems ICD-10 Encounter Code Description Active Date MDM Diagnosis L89.154 Pressure ulcer of sacral region, stage 4 10/04/2020 No Yes L89.312 Pressure ulcer of right buttock, stage 2 10/04/2020 No Yes L89.620 Pressure ulcer of left heel, unstageable 11/08/2020 No Yes L89.610 Pressure ulcer of right heel, unstageable 10/04/2020 No Yes S14.104D Unspecified injury at C4 level of cervical spinal cord, subsequent 10/04/2020 No Yes encounter Inactive Problems Resolved Problems Electronic Signature(s) Signed: 11/09/2020 12:09:37 PM By: Linton Ham MD Entered By: Linton Ham on 11/08/2020 12:42:24 Stahle, Jamont ([id number]) -------------------------------------------------------------------------------- Progress Note Details Patient Name: [name] Date of Service: 11/08/2020 11:00 AM Medical Record Number: [id number] Patient Account Number: [id number] Date of Birth/Sex: [birth year] (70 y.o. M) Treating RN: Cornell Barman Primary Care Provider: Charlynne Cousins Other Clinician: Referring Provider: Park Liter Treating Provider/Extender: Tito Dine in Treatment: 5 Subjective History of Present Illness (HPI) ADMISSION 10/04/2020 Mr. [name] is a 70 year old man who is here accompanied by his daughters. 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 He spent a long time in the neuro ICU after his surgical stabilization at [facility]. 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 I do not have much information about the start of this or how it progressed but he was readmitted to [facility] from 08/14/2020 through 08/27/2020 with an infected sacral pressure ulcer. He required a operative debridement by general surgery on 11/24. Wound culture grew Enterococcus and he is just completing 6 weeks of Zosyn under  the tutelage of infectious disease. According to his daughters the heel wounds that he has started at that time although I am really not able to get much of a history on these. He came in with small areas on the right heel laterally and 2 on the left. They have been using a wound VAC on the sacrum with a foam cover over the small superficial area on the right buttock under the VAC seal. And  I think foam on the heels as well as the great toe on the left. He has a level 2 surface. 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 His CT scan of the pelvis with contrast on 11/23 did show the large left sacral decubitus ulcer with evidence of sacral osteomyelitis. We have regular collagen under wound VAC. He also has a substantial area on the right heel and a more superficial area on the left. I debrided the right heel last week it does not look much better this week. He is definitely going to need ABIs on that side which we will arrange today We are able to document his ABIs today at 1.0 on the right and 1.01 on the left 2/16; we have made some improvement in the overall depth of the sacral wound. He has completed his antibiotics. Unfortunately home health [[facility]] has decided to withdraw from the case. The patient's daughters are changing the wound VAC both are nurses and work 12-hour shifts they are exhausted. We have been using the wound VAC to the area on the sacrum. Comes in with a new area that looks like an injury from lying on to being on the right buttock and right posterior hip. Is possible that the hip may become more ominous. Objective Constitutional Patient is hypotensive. However appears well. Pulse regular and within  target range for patient.Marland Kitchen Respirations regular, non-labored and within target range.. Temperature is normal and within the target range for the patient.Marland Kitchen appears in no distress. Vitals Time Taken: 11:47 AM, Height: 77 in, Weight: 240 lbs, BMI: 28.5, Temperature: 98.3 F, Pulse: 84 bpm, Respiratory Rate: 20 breaths/min, Blood Pressure: 97/67 mmHg. Cardiovascular Dorsalis pedis pulses are palpable bilaterally. Psychiatric Patient is responsive. General Notes: Wound exam Stage IV wound over the sacrum again undermined superiorly. The depth of this is come in somewhat. So has the undermining at roughly 12-3 o'clock however the orifice is come in more. He also apparently has a VAC tube injury to the right buttock. All of his skin around the whole area looks somewhat dry and excoriated The left heel again now is substantial. Armed within normal ABIs from last week I went ahead and did debridement with a #10 scalpel and pickups. Begin the process of seeing if I can get a healthy surface here. The right heel is better but there is still some necrotic debris in the center I did not debride this Integumentary (Hair, Skin) Wound #1 status is Open. Original cause of wound was Pressure Injury. The wound is located on the Right Gluteus. The wound measures 5cm Malicki, Cotton ([id number]) length x 5.5cm width x 0.1cm depth; 21.598cm^2 area and 2.16cm^3 volume. There is Fat Layer (Subcutaneous Tissue) exposed. There is no tunneling or undermining noted. There is a medium amount of serosanguineous drainage noted. There is large (67-100%) pink granulation within the wound bed. There is a small (1-33%) amount of necrotic tissue within the wound bed including Adherent Slough. Wound #2 status is Open. Original cause of wound was Pressure Injury. The wound is located on the Sacrum. The wound measures 2cm length x 1.3cm width x 2.3cm depth; 2.042cm^2 area and 4.697cm^3 volume. There is Fat Layer (Subcutaneous Tissue)  exposed. There is no tunneling noted, however, there is  undermining starting at 12:00 and ending at 12:00 with a maximum distance of 3cm. There is a medium amount of serosanguineous drainage noted. The wound margin is flat and intact. There is medium (34-66%) pink granulation within the wound bed. There is a medium (34-66%) amount of necrotic tissue within the wound bed. Wound #3 status is Open. Original cause of wound was Pressure Injury. The wound is located on the Right Calcaneus. The wound measures 6cm length x 3cm width x 0.1cm depth; 14.137cm^2 area and 1.414cm^3 volume. There is Fat Layer (Subcutaneous Tissue) exposed. There is no tunneling or undermining noted. There is a medium amount of serosanguineous drainage noted. There is medium (34-66%) red granulation within the wound bed. There is a medium (34-66%) amount of necrotic tissue within the wound bed including Eschar. Wound #4 status is Open. Original cause of wound was Pressure Injury. The wound is located on the Left Calcaneus. The wound measures 5cm length x 6cm width x 0.1cm depth; 23.562cm^2 area and 2.356cm^3 volume. There is Fat Layer (Subcutaneous Tissue) exposed. There is no tunneling or undermining noted. There is a none present amount of drainage noted. The wound margin is flat and intact. There is no granulation within the wound bed. There is a large (67-100%) amount of necrotic tissue within the wound bed including Eschar. Wound #6 status is Open. Original cause of wound was Pressure Injury. The wound is located on the Left Toe Great. The wound measures 0cm length x 0cm width x 0cm depth; 0cm^2 area and 0cm^3 volume. There is no tunneling or undermining noted. There is a none present amount of drainage noted. The wound margin is flat and intact. There is no granulation within the wound bed. There is no necrotic tissue within the wound bed. Assessment Active Problems ICD-10 Pressure ulcer of sacral region, stage 4 Pressure  ulcer of right buttock, stage 2 Pressure ulcer of left heel, unstageable Pressure ulcer of right heel, unstageable Unspecified injury at C4 level of cervical spinal cord, subsequent encounter Procedures Wound #4 Pre-procedure diagnosis of Wound #4 is a Pressure Ulcer located on the Left Calcaneus . There was a Selective/Open Wound Non-Viable Tissue Debridement with a total area of 30 sq cm performed by Ricard Dillon, MD. With the following instrument(s): Blade, and Forceps to remove Non-Viable tissue/material. Material removed includes Eschar. A time out was conducted at 12:10, prior to the start of the procedure. A Moderate amount of bleeding was controlled with Silver Nitrate. The procedure was tolerated well. Post Debridement Measurements: 5cm length x 6cm width x 0.4cm depth; 9.425cm^3 volume. Post debridement Stage noted as Category/Stage II. Character of Wound/Ulcer Post Debridement is stable. Post procedure Diagnosis Wound #4: Same as Pre-Procedure Plan Follow-up Appointments: Return Appointment in 2 weeks. Home Health: [facility] for wound care. May utilize formulary equivalent dressing for wound treatment orders unless otherwise specified. Home Health Nurse may visit PRN to address patient s wound care needs. [facility] Off-Loading: Heel suspension boot - float heels on pillows as well Turn and reposition every 2 hours Negative Pressure Wound Therapy: Wound #2 Sacrum: Place NPWT on HOLD. - Hold for 2 weeks WOUND #1: - Gluteus Wound Laterality: Right Primary Dressing: AquacelAg Advantage Dressing, 4x5 (in/in) Haynesworth, Cregg ([id number]) Discharge Instructions: Apply to wound as directed Secondary Dressing: ABD Pad 5x9 (in/in) Discharge Instructions: Cover with ABD pad Secured With: 16M Medipore H Soft Cloth Surgical Tape, 2x2 (in/yd) WOUND #2: - Sacrum Wound Laterality: Primary Dressing: Gauze Discharge Instructions: packed lightly,  moistened with  hydrogel Secondary Dressing: ABD Pad 5x9 (in/in) Discharge Instructions: Cover with ABD pad Secured With: 23M Medipore H Soft Cloth Surgical Tape, 2x2 (in/yd) WOUND #3: - Calcaneus Wound Laterality: Right Primary Dressing: AquacelAg Advantage Dressing, 2X2 (in/in) Discharge Instructions: Apply to wound as directed Secondary Dressing: ABD Pad 5x9 (in/in) Discharge Instructions: Cover with ABD pad Secured With: Kerlix Roll Sterile or Non-Sterile 6-ply 4.5x4 (yd/yd) Discharge Instructions: Apply Kerlix as directed WOUND #4: - Calcaneus Wound Laterality: Left Primary Dressing: AquacelAg Advantage Dressing, 2X2 (in/in) Discharge Instructions: Apply to wound as directed Secondary Dressing: ABD Pad 5x9 (in/in) Discharge Instructions: Cover with ABD pad Secured With: Kerlix Roll Sterile or Non-Sterile 6-ply 4.5x4 (yd/yd) Discharge Instructions: Apply Kerlix as directed 1. I am continue with Aquacel Ag to the heels although we may need to move to [facility] 2. I know no whether the wound VAC is going to result in much more improvement we went to a single day hydrogel wet-to-dry dressing. If there is appears to be more drainage we could move to Vashe or Dakin's wet-to-dry as well. 3. It is very clear that the patient's wife is exhausted and the rest of his family is as well by her description. I am not sure what to do to help them. Home health is decided to divorce themselves without our knowledge [[facility]] all try to make sure we do not sign this. It may be because they felt that there was somebody who could do that if it wound VAC however I know know that this excuses the west of his wound needs. Electronic Signature(s) Signed: 11/08/2020 12:48:50 PM By: Linton Ham MD Entered By: Linton Ham on 11/08/2020 12:48:50 Mazzuca, Alistar ([id number]) -------------------------------------------------------------------------------- SuperBill Details Patient Name: [name] Date of Service:  11/08/2020 Medical Record Number: [id number] Patient Account Number: [id number] Date of Birth/Sex: [birth year] (70 y.o. M) Treating RN: Cornell Barman Primary Care Provider: Charlynne Cousins Other Clinician: Referring Provider: Park Liter Treating Provider/Extender: Tito Dine in Treatment: 5 Diagnosis Coding ICD-10 Codes Code Description L89.154 Pressure ulcer of sacral region, stage 4 L89.312 Pressure ulcer of right buttock, stage 2 L89.620 Pressure ulcer of left heel, unstageable L89.610 Pressure ulcer of right heel, unstageable S14.104D Unspecified injury at C4 level of cervical spinal cord, subsequent encounter Facility Procedures CPT4 Code: [id number] Description: 36629 - DEBRIDE WOUND 1ST 20 SQ CM OR < Modifier: Quantity: 1 CPT4 Code: Description: ICD-10 Diagnosis Description L89.620 Pressure ulcer of left heel, unstageable Modifier: Quantity: CPT4 Code: [id number] Description: 35465 - DEBRIDE WOUND EA ADDL 20 SQ CM Modifier: Quantity: 1 CPT4 Code: Description: ICD-10 Diagnosis Description L89.620 Pressure ulcer of left heel, unstageable Modifier: Quantity: Physician Procedures CPT4 Code: 6812751 Description: 70017 - WC PHYS DEBR WO ANESTH 20 SQ CM Modifier: Quantity: 1 CPT4 Code: Description: ICD-10 Diagnosis Description L89.620 Pressure ulcer of left heel, unstageable Modifier: Quantity: CPT4 Code: 4944967 Description: 59163 - WC PHYS DEBR WO ANESTH EA ADD 20 CM Modifier: Quantity: 1 CPT4 Code: Description: ICD-10 Diagnosis Description L89.620 Pressure ulcer of left heel, unstageable Modifier: Quantity: Electronic Signature(s) Signed: 11/09/2020 12:09:37 PM By: Linton Ham MD Entered By: Linton Ham on 11/08/2020 12:42:46

## 2020-11-10 ENCOUNTER — Other Ambulatory Visit: Payer: Self-pay

## 2020-11-10 ENCOUNTER — Other Ambulatory Visit: Payer: BC Managed Care – PPO

## 2020-11-10 DIAGNOSIS — N3000 Acute cystitis without hematuria: Secondary | ICD-10-CM

## 2020-11-14 LAB — URINE CULTURE

## 2020-11-14 MED ORDER — CIPROFLOXACIN HCL 500 MG PO TABS: 500.0000 mg | ORAL_TABLET | Freq: Two times a day (BID) | ORAL | 0 refills | Status: AC

## 2020-11-14 NOTE — Addendum Note (Signed)
Addended by: Jon Billings on: 11/14/2020 10:00 AM   Modules accepted: Orders

## 2020-11-14 NOTE — Progress Notes (Signed)
Patient's urine culture grew bacteria.  I will send the same antibiotic patient received last time due to those results. Please let patient's family know and that they should make sure he completes the whole course of medication.

## 2020-11-17 ENCOUNTER — Other Ambulatory Visit: Payer: Self-pay | Admitting: Physical Medicine and Rehabilitation

## 2020-11-18 ENCOUNTER — Other Ambulatory Visit: Payer: Self-pay | Admitting: Physical Medicine and Rehabilitation

## 2020-11-20 ENCOUNTER — Other Ambulatory Visit: Payer: Self-pay | Admitting: Internal Medicine

## 2020-11-20 NOTE — Telephone Encounter (Signed)
Medication: FLUoxetine (PROZAC) 20 MG/5ML solution [[id number]] Script was orginally prescribed in the hospital.  Has the patient contacted their pharmacy? YES  (Agent: If no, request that the patient contact the pharmacy for the refill.) (Agent: If yes, when and what did the pharmacy advise?)  Preferred Pharmacy (with phone number or street name): [facility] ([facility] [place] - [address] [place] ([place]) [place] [address] Phone: [phone] Fax: [phone] Hours: Not open 24 hours    Agent: Please be advised that RX refills may take up to 3 business days. We ask that you follow-up with your

## 2020-11-20 NOTE — Telephone Encounter (Signed)
Requested medication (s) are due for refill today: yes  Requested medication (s) are on the active medication list: yes  Last refill:  10/28.21  Future visit scheduled: yes  Notes to clinic:  prescribed by Reesa Chew, PA    Requested Prescriptions  Pending Prescriptions Disp Refills   FLUoxetine (PROZAC) 20 MG/5ML solution 1240 mL 0    Sig: Place 10 mLs (40 mg total) into feeding tube daily.      Psychiatry:  Antidepressants - SSRI Passed - 11/20/2020  8:37 AM      Passed - Valid encounter within last 6 months    Recent Outpatient Visits           1 week ago Appointment canceled by hospital   [facility], Megan P, DO   4 weeks ago Acute cystitis without hematuria   [place], NP   1 month ago [place] P, DO   2 months ago Aspiration pneumonia of both lower lobes, unspecified aspiration pneumonia type [facility])   [place], DO   2 months ago Neck pain   [facility] Kathrine Haddock, NP       Future Appointments             In 3 days Vigg, Avanti, MD [facility], [place]   In 4 days Stoioff, Ronda Fairly, MD [place]

## 2020-11-21 ENCOUNTER — Other Ambulatory Visit: Payer: BC Managed Care – PPO | Admitting: Nurse Practitioner

## 2020-11-21 ENCOUNTER — Encounter: Payer: Self-pay | Admitting: Nurse Practitioner

## 2020-11-21 ENCOUNTER — Other Ambulatory Visit: Payer: Self-pay

## 2020-11-21 DIAGNOSIS — S14109S Unspecified injury at unspecified level of cervical spinal cord, sequela: Secondary | ICD-10-CM

## 2020-11-21 DIAGNOSIS — Z515 Encounter for palliative care: Secondary | ICD-10-CM

## 2020-11-21 NOTE — Progress Notes (Signed)
[facility] [facility] Palliative Care Consult Note Telephone: [phone]  Fax: [phone]  PATIENT NAME: Ryan Day DOB: 1951/04/16 MRN: [id number]  PRIMARY CARE PROVIDER:   Charlynne Cousins, MD  REFERRING PROVIDER:  Valerie Roys, DO [place],  [place] [address]  RESPONSIBLE PARTY:Self; wife Kenechukwu Eckstein [phone]; [phone]  1.Advance Care Planning;Full code with aggressive interventions; MOST form in vynca  2. Goals of Care: Goals include to maximize quality of life and symptom management. Our advance care planning conversation included a discussion about:   The value and importance of advance care planning  Exploration of personal, cultural or spiritual beliefs that might influence medical decisions  Exploration of goals of care in the event of a sudden injury or illness  Identification and preparation of a healthcare agent  Review and updating or creation of anadvance directive document.  3. Secretions increased amounts; discussed and will send in rx for scolopamine patches/atropine gtts; discussed suctioning; positioning; vest  4.Palliative care encounter; Palliative care encounter; Palliative medicine team will continue to support patient, patient's family, and medical team. Visit consisted of counseling and education dealing with the complex and emotionally intense issues of symptom management and palliative care in the setting of serious and potentially life-threatening illness  5. F/u4 weeks for ongoing monitoring chronic disease management, symptoms, nutrition, complex medical decision making   I spent 60 minutes providing this consultation,  from 12:15pm to 1:15pm. More than 50% of the time in this consultation was spent coordinating communication.   HISTORY OF PRESENT ILLNESS:  Mcdonald Reiling is a 70 y.o. 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 Hospitalized 08/14/2020 to 08/27/2020 for shortness of breath, sacral pressure ulcer. Mr. Fackler underwent debridement on 08/16/2020 with wound vac which he continues to have. Workup significant for osteomyelitis of left sacral decubitus ulcer, aspiration PNA, hypernatremia, increase airway secretions with hx mucous plugging, hypokalemia, peg-dependent with tube feeding, hypotension on midodrine, florinef with stable bp, C5 vertebral body fx, C3-C6 cord compression with edema s/p C3-C6 laminectomy with fusion, repair of dural tear in 04/[facility] at [facility]. Depression continued on prozac, continue on famotidine for gerd, iron supplements for IDA. I called Mrs. Aderhold to confirm follow up in person Palliative care visit with Mr. Prosser. Mrs. Rakes in agreement and covid screening negative. I visited Mr. And Mrs Kanady with daughter in their home. Mr. Terrien was lying in his hospital bed in his room. We talked about purpose of Palliative care visit. We talked about how he has been feeling today. Mr. Roza endorses that he has doing okay. We talked about the wound ffor which the wound VAC removed for 2 weeks to see how it does. Mr. Dolley has completed antibiotics. Mr. Day does continue to receive [place] through [facility]. We talked about tube feedings with no residuals and no difficulties. We talked about bowls have been regulated with current regiment. We talked about increase in secretions. Mrs. Lowy about Scopolamine Patches which was used when he was hospitalized though she did not have any refills. Discussed with Mrs. Maye will refill Scopolamine Patches. Mrs. Trefry can actually put two behind his ear every three days. We also talked about option of adding atropine drops. Mrs. Dicke endorses she would like to try it. Discuss will send in prescriptions. We talked about upcoming appointment with Dr Wynetta Emery. Mr. Vecchio does need a refill on his antidepressant for  which they will ask Dr Wynetta Emery at the time of  their visit. We talked about Mr. Frankowski not requiring oxygen at this time his O2 SATs have been 90s. We talked about the vest that he does wear. Mr. Stearns endorses he does not like to wear it it bothers his neck. Family puts a towel there to make it feel a little better to keep it from rubbing. Mrs. Crossan endorses they typically use the vest about once a day or less. Mrs. Prestage talked about incentive spirometer and encouraged Mr Loiseau to continue using. We talked about speech therapy continues to come to work with Mr Severin. We talked about requesting a follow-up swallowing study.  We talked about upcoming appointment at [facility] physical therapy for Inpatient Rehab appointment. Mrs Mayer Camel endorses her hopes are that he will be admitted to the rehab program for six months in hopes of some functional Improvement. We talked about transportation getting Mr. Trow to his appointments. Mrs. Habel endorses Mr. Vondrak is able to get in the [place] with w/c though it is difficult. Mr Malina does get very tired.  At present time Mr. Valdes remains totally ADL depended bathing, dressing, turning, positioning. Mr. Carrigan does continue to have an indwelling Foley. We talked about recent urinary tract infection. Mr. Vestal has an upcoming appointment with Urology. Medical goals reviewed. We talked about Medicaid has been granted. We talked about the CAPS program versus Personal Care Service program. Discussed [facility] home health does have a Education officer, museum who came out but was not able to talk to Mrs. Yurchak phone at the time, left his card. Encourage Mrs. Vinluan to contact to further discuss caps program which they have already completed CAPS application for follow-up and or personal care services option. We talked about Mr. Hippler core strength which is very weak. We talked about symptoms of pain. We talked about the baclofen that was recently increased from 5 to 10 mg tid. We talked about asking Dr Wynetta Emery if possible to increase the baclofen 20 mg PID to help  with pain management. We talked about the [place]-J collar that Mr. Febles previously had, likely left at hospital. We talked about seeing if Dr Wynetta Emery could write a prescription for a new [place]-J collar to be fitted which would help with support on Mr. Ulin neck. We talked about the muscles in his next weekend and the [place]-J collar would give Mr. Pettigrew support. We talked about mobility, Mr Topper is a total lift to the wheelchair where he has head support. Mr. Adrian does get up about once a week. Mrs Ormond endorses Mr. Colegrove does not like to get up as the head piece is not as comfortable. We talked about irritability and frustration with Mr. Pettis current disability would functional decline. Mr Melott endorsed he does get frustrated at times. We talked about frustration, quality of life, coping strategies. Recommended bereavement counselor as we talked about the grieving process with the loss of the life that he had expected to live due to this accident. Mrs. Gilardi requested to see if it is possible to access a counselor for bereavement. Mr. Kloth has been in communication with a support group for what she feels like would be very beneficial for Mr. Norton Pastel. Mr. Liburd does not want to participate in that. We talked about giving Mr. Lavell time this is a lot to have happened to him at one time. We talked about realistic expectations. Praised Mrs. Head, encouraged her to continue reaching out to  support group for support. We talked about caregiver stress and fatigue. Mrs. Szczesny endorses she is doing better getting rest. We talked about role of Palliative care and plan of care. We talked about follow-up visit in six weeks if needed or sooner should he declined. Mrs. Kamen in agreement, appointment scheduled. Therapeutic listening and emotional support provided. Contact information. Questions answered to satisfaction  RX Scopolamine Patches; place 2 patches q72hrs as needed for secretions; #20; 1 RF escribed to [employer]  RX atropine  drops; 2gtts q4hrs as needed for secretions; #1; 1RF; escribed to [facility]  Recommendations to increase baclofen 220 mg tid  Recommended [place]-J collar  Recommended swallowing study through speech   Recommended [place] worker to follow up with CAPS application if service is not available then to have primary complete PCS application.   ROS reviewed all negative except secretions; fatigue  Palliative Care was asked to help to continue to address goals of care.   CODE STATUS: Full code with full interventions  PPS: 30% HOSPICE ELIGIBILITY/DIAGNOSIS: TBD  PAST MEDICAL HISTORY:  Past Medical History:  Diagnosis Date  . Acute on chronic respiratory failure with hypoxia ([place])   . Acute renal injury due to hypovolemia ([place])   . Autonomic instability   . Cardiac arrest ([place])   . Cervical spinal cord injury, sequela ([place])   . Elevated alkaline phosphatase level   . 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 Hyperlipidemia   . Hypertension     SOCIAL HX:  Social History   Tobacco Use  . Smoking status: Never Smoker  . Smokeless tobacco: Never Used  Substance Use Topics  . Alcohol use: Not Currently    ALLERGIES: No Known Allergies   PERTINENT MEDICATIONS:  Outpatient Encounter Medications as of 11/21/2020  Medication Sig  . ciprofloxacin (CIPRO) 500 MG tablet Take 1 tablet (500 mg total) by mouth 2 (two) times daily for 7 days.  Marland Kitchen acetaminophen (TYLENOL) 160 MG/5ML solution Place 10-20 mLs (320-640 mg total) into feeding tube every 4 (four) hours as needed for mild pain.  Marland Kitchen ascorbic acid (VITAMIN C) 500 MG tablet Place 1 tablet (500 mg total) into feeding tube 2 (two) times daily.  . baclofen (LIORESAL) 10 MG tablet Take 1 tablet (10 mg total) by mouth 3 (three) times daily.  . bisacodyl (DULCOLAX) 10 MG suppository Place 1 suppository (10 mg total) rectally at bedtime.  . chlorhexidine (PERIDEX) 0.12 % solution 15 mLs by Mouth Rinse route 2 (two) times daily.   . collagenase (SANTYL) ointment Apply topically daily. (Patient not taking: Reported on 11/07/2020)  . diclofenac Sodium (VOLTAREN) 1 % GEL Apply 4 g topically 4 (four) times daily.  . famotidine (PEPCID) 20 MG tablet Place 1 tablet (20 mg total) into feeding tube daily.  . ferrous sulfate 300 (60 Fe) MG/5ML syrup Place 1.3 mLs (78 mg total) into feeding tube daily.  . fludrocortisone (FLORINEF) 0.1 MG tablet Take 200 mcg by mouth daily.  Marland Kitchen FLUoxetine (PROZAC) 20 MG/5ML solution Place 10 mLs (40 mg total) into feeding tube daily.  Marland Kitchen guaiFENesin 200 MG tablet Place 2 tablets (400 mg total) into feeding tube every 6 (six) hours.  . lidocaine (LIDODERM) 5 % Place 1 patch onto the skin daily. Remove & Discard patch within 12 hours or as directed by MD  . midodrine (PROAMATINE) 10 MG tablet Place 1 tablet (10 mg total) into feeding tube 3 (three) times daily with meals.  . Mouthwashes (MOUTH RINSE) LIQD  solution 15 mLs by Mouth Rinse route 2 times daily at 12 noon and 4 pm.  . Multiple Vitamin (MULTIVITAMIN) LIQD Place 5 mLs into feeding tube daily.  . nutrition supplement, JUVEN, (JUVEN) PACK Place 1 packet into feeding tube 2 (two) times daily between meals.  . Nutritional Supplements (FEEDING SUPPLEMENT, OSMOLITE 1.5 CAL,) LIQD Place 474 mLs into feeding tube 4 (four) times daily.  . Nutritional Supplements (FEEDING SUPPLEMENT, PROSOURCE TF,) liquid Place 45 mLs into feeding tube 2 (two) times daily.  . ondansetron (ZOFRAN) 4 MG tablet Place 1 tablet (4 mg total) into feeding tube every 8 (eight) hours as needed for nausea, vomiting or refractory nausea / vomiting.  . polycarbophil (FIBERCON) 625 MG tablet Place 1 tablet (625 mg total) into feeding tube daily.  . rivaroxaban (XARELTO) 10 MG TABS tablet Take one tablet per tube daily  . saccharomyces boulardii (FLORASTOR) 250 MG capsule Place 1 capsule (250 mg total) into feeding tube 2 (two) times daily.  . sennosides (SENOKOT) 8.8 MG/5ML syrup  Place 5 mLs into feeding tube daily at 6 (six) AM.  . simethicone (MYLICON) 40 RT/0.2TR drops Place 1.2 mLs (80 mg total) into feeding tube 4 (four) times daily.  . traZODone (DESYREL) 50 MG tablet Place 1 tablet (50 mg total) into feeding tube at bedtime.  . Water For Irrigation, Sterile (FREE WATER) SOLN Place 400 mLs into feeding tube every 4 (four) hours. (Patient taking differently: Place 300 mLs into feeding tube every 4 (four) hours.)  . zinc sulfate 220 (50 Zn) MG capsule Place 1 capsule (220 mg total) into feeding tube daily.   No facility-administered encounter medications on file as of 11/21/2020.    PHYSICAL EXAM:   General: quadriplegic, pleasant male Cardiovascular: regular rate and rhythm Pulmonary: clear ant fields Abdomen: soft, nontender, + bowel sounds; gtube Extremities: no edema, no joint deformities Neurological: quadriplegic Meesha Sek Ihor Gully, NP

## 2020-11-22 ENCOUNTER — Ambulatory Visit: Payer: BC Managed Care – PPO | Admitting: Internal Medicine

## 2020-11-22 ENCOUNTER — Telehealth: Payer: Self-pay

## 2020-11-22 MED ORDER — FLUOXETINE HCL 20 MG/5ML PO SOLN: 40.0000 mg | Freq: Every day | ORAL | 0 refills | Status: AC

## 2020-11-22 NOTE — Telephone Encounter (Signed)
Called Collene Mares and discussed.

## 2020-11-22 NOTE — Telephone Encounter (Signed)
Pt has [employer] and is unable to see current [facility] per practice admin send message to let Dr.Johnson know as she would be the only provider who can see him, [name] stated they have already called EMS and they have agreed to bring him to apt this day and time. I am currently working on getting the current Pt Dr.Johnson has at 1 to see is he can be seen later at 2 so Pt came come in at 1 due to him already having ride with EMS set up.

## 2020-11-23 ENCOUNTER — Ambulatory Visit: Payer: BC Managed Care – PPO | Admitting: Internal Medicine

## 2020-11-24 ENCOUNTER — Encounter: Payer: Self-pay | Admitting: Family Medicine

## 2020-11-24 ENCOUNTER — Telehealth: Payer: Self-pay

## 2020-11-24 ENCOUNTER — Ambulatory Visit: Payer: BC Managed Care – PPO | Admitting: Urology

## 2020-11-24 NOTE — Telephone Encounter (Signed)
Copied from [place] [phone]. Topic: General - Other >> Nov 24, 2020 10:50 AM Leward Quan A wrote: Reason for CRM: Patient wife [name] called in asking if Dr can order a culture on patient stool say that he have a lot of mucus coming out with his bowel movements. Would like a call back please at Ph# [phone]   Pt has apt on 12/01/2020. Please advise.

## 2020-11-24 NOTE — Telephone Encounter (Signed)
Routing to provider  

## 2020-11-24 NOTE — Telephone Encounter (Signed)
Can be discussed at his appointment.

## 2020-11-24 NOTE — Telephone Encounter (Signed)
Copied from [place] [phone]. Topic: General - Other >> Nov 24, 2020 10:50 AM Leward Quan A wrote: Reason for CRM: Patient wife [name] called in asking if Dr can order a culture on patient stool say that he have a lot of mucus coming out with his bowel movements. Would like a call back please at Ph# [phone]   Pt has apt on 12/01/2020. Please advise

## 2020-11-24 NOTE — Telephone Encounter (Signed)
Patients wife notified

## 2020-11-24 NOTE — Telephone Encounter (Signed)
Copied from [place] [phone]. Topic: General - Other >> Nov 24, 2020 10:50 AM Leward Quan A wrote: Reason for CRM: Patient wife [name] called in asking if Dr can order a culture on patient stool say that he have a lot of mucus coming out with his bowel movements. Would like a call back please at Ph# [phone]

## 2020-11-24 NOTE — Telephone Encounter (Signed)
Per Dr.Johnson can be discussed at apt.

## 2020-11-29 ENCOUNTER — Telehealth: Payer: Self-pay | Admitting: Internal Medicine

## 2020-11-29 DIAGNOSIS — N390 Urinary tract infection, site not specified: Secondary | ICD-10-CM

## 2020-11-29 DIAGNOSIS — T83511A Infection and inflammatory reaction due to indwelling urethral catheter, initial encounter: Secondary | ICD-10-CM

## 2020-11-29 DIAGNOSIS — R82998 Other abnormal findings in urine: Secondary | ICD-10-CM

## 2020-11-29 NOTE — Telephone Encounter (Signed)
Needs to come in the office for evaluation. If he cannot- will need to establish with a provider who is able to come out to his home.

## 2020-11-29 NOTE — Telephone Encounter (Signed)
Called [name] pt's wife advised to her of Dr Durenda Age message. Pt is scheduled for office visit 3/11 she is wanting to know if we have room enough to have pt in office with a stretcher because he will have to be transported by stretcher. [name] states that it is difficult and costly to do so. She has to let them know something tomorrow. Also wants to know about recommendations on any providers that would be ok with doing in home visits. Please advise.

## 2020-11-29 NOTE — Telephone Encounter (Signed)
[facility] nurse called to request that the doctor send in orders or labs and said that she could draw the labs at his home visit since she is authorized to do labs.  She requested this so that the patient did not have to come to office since it is difficult for him and family to transport.  The fax # is [phone].  Please advise and call today if possible to confirm.  CB# [phone]

## 2020-11-30 NOTE — Telephone Encounter (Signed)
We can see him in the procedure room. We have seen people on stretchers before. [employer] is a company that completely focuses on home care.

## 2020-11-30 NOTE — Telephone Encounter (Signed)
Patients wife notified

## 2020-12-01 ENCOUNTER — Telehealth: Payer: Self-pay

## 2020-12-01 ENCOUNTER — Ambulatory Visit: Payer: BC Managed Care – PPO | Admitting: Family Medicine

## 2020-12-01 NOTE — Telephone Encounter (Signed)
12/01/20 @145PM : palliative care SW outreached patients spouse, per NP request, to discuss counseling support for patient due to his new health condition of being a quadriplegic and coping with the changes.  Spouse shared that she and patient have the contact information for the [facility] that offers counseling and group therapy for quadriplegics. Wife shared that she has had a couple of contact calls with them and they have offered to do telephone/virtual counseling as well as in person session and patient has declined this support numerous times. Spouse shares that patient could really benefit from in person counseling session but patient is not open to at this time.   SW provided supportive and reflective listening to wife, she also can gain caregiver support from [place] if needed. No other concerns for SW at this time.

## 2020-12-04 ENCOUNTER — Encounter: Payer: Self-pay | Admitting: Family Medicine

## 2020-12-05 ENCOUNTER — Other Ambulatory Visit: Payer: Self-pay

## 2020-12-05 DIAGNOSIS — R32 Unspecified urinary incontinence: Secondary | ICD-10-CM

## 2020-12-06 ENCOUNTER — Encounter: Payer: BC Managed Care – PPO | Attending: Internal Medicine | Admitting: Internal Medicine

## 2020-12-06 ENCOUNTER — Other Ambulatory Visit: Payer: Self-pay

## 2020-12-07 ENCOUNTER — Inpatient Hospital Stay: Payer: Medicare Other

## 2020-12-07 ENCOUNTER — Ambulatory Visit: Payer: Self-pay | Admitting: *Deleted

## 2020-12-07 ENCOUNTER — Other Ambulatory Visit: Payer: Self-pay

## 2020-12-07 ENCOUNTER — Encounter: Payer: Self-pay | Admitting: Internal Medicine

## 2020-12-07 ENCOUNTER — Other Ambulatory Visit: Payer: BC Managed Care – PPO

## 2020-12-07 ENCOUNTER — Emergency Department: Payer: Medicare Other

## 2020-12-07 ENCOUNTER — Inpatient Hospital Stay
Admission: EM | Admit: 2020-12-07 | Discharge: 2021-01-05 | DRG: 870 | Disposition: A | Discharge status: Rehabilitation Facility | Payer: Medicare Other | Attending: Internal Medicine | Admitting: Internal Medicine

## 2020-12-07 DIAGNOSIS — J9601 Acute respiratory failure with hypoxia: Secondary | ICD-10-CM | POA: Diagnosis not present

## 2020-12-07 DIAGNOSIS — Z7901 Long term (current) use of anticoagulants: Secondary | ICD-10-CM

## 2020-12-07 DIAGNOSIS — K5730 Diverticulosis of large intestine without perforation or abscess without bleeding: Secondary | ICD-10-CM | POA: Diagnosis present

## 2020-12-07 DIAGNOSIS — J9602 Acute respiratory failure with hypercapnia: Secondary | ICD-10-CM | POA: Diagnosis not present

## 2020-12-07 DIAGNOSIS — L8994 Pressure ulcer of unspecified site, stage 4: Secondary | ICD-10-CM | POA: Diagnosis not present

## 2020-12-07 DIAGNOSIS — G909 Disorder of the autonomic nervous system, unspecified: Secondary | ICD-10-CM | POA: Diagnosis not present

## 2020-12-07 DIAGNOSIS — A4152 Sepsis due to Pseudomonas: Secondary | ICD-10-CM | POA: Diagnosis present

## 2020-12-07 DIAGNOSIS — R609 Edema, unspecified: Secondary | ICD-10-CM

## 2020-12-07 DIAGNOSIS — Z930 Tracheostomy status: Secondary | ICD-10-CM

## 2020-12-07 DIAGNOSIS — Y92009 Unspecified place in unspecified non-institutional (private) residence as the place of occurrence of the external cause: Secondary | ICD-10-CM | POA: Diagnosis not present

## 2020-12-07 DIAGNOSIS — S12400S Unspecified displaced fracture of fifth cervical vertebra, sequela: Secondary | ICD-10-CM | POA: Diagnosis not present

## 2020-12-07 DIAGNOSIS — N170 Acute kidney failure with tubular necrosis: Secondary | ICD-10-CM | POA: Diagnosis present

## 2020-12-07 DIAGNOSIS — Z515 Encounter for palliative care: Secondary | ICD-10-CM | POA: Diagnosis not present

## 2020-12-07 DIAGNOSIS — R8271 Bacteriuria: Secondary | ICD-10-CM | POA: Diagnosis not present

## 2020-12-07 DIAGNOSIS — R140 Abdominal distension (gaseous): Secondary | ICD-10-CM | POA: Diagnosis not present

## 2020-12-07 DIAGNOSIS — D649 Anemia, unspecified: Secondary | ICD-10-CM | POA: Diagnosis not present

## 2020-12-07 DIAGNOSIS — R001 Bradycardia, unspecified: Secondary | ICD-10-CM | POA: Diagnosis not present

## 2020-12-07 DIAGNOSIS — R000 Tachycardia, unspecified: Secondary | ICD-10-CM | POA: Diagnosis present

## 2020-12-07 DIAGNOSIS — R6520 Severe sepsis without septic shock: Secondary | ICD-10-CM | POA: Diagnosis not present

## 2020-12-07 DIAGNOSIS — I5033 Acute on chronic diastolic (congestive) heart failure: Secondary | ICD-10-CM | POA: Diagnosis not present

## 2020-12-07 DIAGNOSIS — B9689 Other specified bacterial agents as the cause of diseases classified elsewhere: Secondary | ICD-10-CM | POA: Diagnosis not present

## 2020-12-07 DIAGNOSIS — Z7189 Other specified counseling: Secondary | ICD-10-CM | POA: Diagnosis not present

## 2020-12-07 DIAGNOSIS — Z66 Do not resuscitate: Secondary | ICD-10-CM | POA: Diagnosis not present

## 2020-12-07 DIAGNOSIS — J9600 Acute respiratory failure, unspecified whether with hypoxia or hypercapnia: Secondary | ICD-10-CM

## 2020-12-07 DIAGNOSIS — N179 Acute kidney failure, unspecified: Secondary | ICD-10-CM | POA: Diagnosis not present

## 2020-12-07 DIAGNOSIS — J151 Pneumonia due to Pseudomonas: Secondary | ICD-10-CM | POA: Diagnosis present

## 2020-12-07 DIAGNOSIS — Z981 Arthrodesis status: Secondary | ICD-10-CM

## 2020-12-07 DIAGNOSIS — B9562 Methicillin resistant Staphylococcus aureus infection as the cause of diseases classified elsewhere: Secondary | ICD-10-CM | POA: Diagnosis not present

## 2020-12-07 DIAGNOSIS — Z6832 Body mass index (BMI) 32.0-32.9, adult: Secondary | ICD-10-CM

## 2020-12-07 DIAGNOSIS — A419 Sepsis, unspecified organism: Secondary | ICD-10-CM

## 2020-12-07 DIAGNOSIS — Z91013 Allergy to seafood: Secondary | ICD-10-CM

## 2020-12-07 DIAGNOSIS — I469 Cardiac arrest, cause unspecified: Secondary | ICD-10-CM | POA: Diagnosis not present

## 2020-12-07 DIAGNOSIS — J9611 Chronic respiratory failure with hypoxia: Secondary | ICD-10-CM

## 2020-12-07 DIAGNOSIS — I480 Paroxysmal atrial fibrillation: Secondary | ICD-10-CM | POA: Diagnosis present

## 2020-12-07 DIAGNOSIS — W19XXXS Unspecified fall, sequela: Secondary | ICD-10-CM | POA: Diagnosis present

## 2020-12-07 DIAGNOSIS — G8250 Quadriplegia, unspecified: Secondary | ICD-10-CM | POA: Diagnosis present

## 2020-12-07 DIAGNOSIS — J189 Pneumonia, unspecified organism: Secondary | ICD-10-CM | POA: Diagnosis not present

## 2020-12-07 DIAGNOSIS — J9621 Acute and chronic respiratory failure with hypoxia: Secondary | ICD-10-CM | POA: Diagnosis present

## 2020-12-07 DIAGNOSIS — D638 Anemia in other chronic diseases classified elsewhere: Secondary | ICD-10-CM | POA: Diagnosis present

## 2020-12-07 DIAGNOSIS — R6521 Severe sepsis with septic shock: Secondary | ICD-10-CM | POA: Diagnosis present

## 2020-12-07 DIAGNOSIS — Z800 Family history of malignant neoplasm of digestive organs: Secondary | ICD-10-CM

## 2020-12-07 DIAGNOSIS — L89613 Pressure ulcer of right heel, stage 3: Secondary | ICD-10-CM | POA: Diagnosis present

## 2020-12-07 DIAGNOSIS — Z20822 Contact with and (suspected) exposure to covid-19: Secondary | ICD-10-CM | POA: Diagnosis present

## 2020-12-07 DIAGNOSIS — R32 Unspecified urinary incontinence: Secondary | ICD-10-CM

## 2020-12-07 DIAGNOSIS — A4102 Sepsis due to Methicillin resistant Staphylococcus aureus: Secondary | ICD-10-CM | POA: Diagnosis not present

## 2020-12-07 DIAGNOSIS — J9690 Respiratory failure, unspecified, unspecified whether with hypoxia or hypercapnia: Secondary | ICD-10-CM

## 2020-12-07 DIAGNOSIS — L89156 Pressure-induced deep tissue damage of sacral region: Secondary | ICD-10-CM | POA: Diagnosis not present

## 2020-12-07 DIAGNOSIS — Z8674 Personal history of sudden cardiac arrest: Secondary | ICD-10-CM

## 2020-12-07 DIAGNOSIS — J9622 Acute and chronic respiratory failure with hypercapnia: Secondary | ICD-10-CM | POA: Diagnosis present

## 2020-12-07 DIAGNOSIS — E872 Acidosis: Secondary | ICD-10-CM | POA: Diagnosis present

## 2020-12-07 DIAGNOSIS — R7881 Bacteremia: Secondary | ICD-10-CM | POA: Diagnosis not present

## 2020-12-07 DIAGNOSIS — J690 Pneumonitis due to inhalation of food and vomit: Secondary | ICD-10-CM | POA: Diagnosis not present

## 2020-12-07 DIAGNOSIS — B952 Enterococcus as the cause of diseases classified elsewhere: Secondary | ICD-10-CM | POA: Diagnosis not present

## 2020-12-07 DIAGNOSIS — L89154 Pressure ulcer of sacral region, stage 4: Secondary | ICD-10-CM | POA: Diagnosis present

## 2020-12-07 DIAGNOSIS — E861 Hypovolemia: Secondary | ICD-10-CM | POA: Diagnosis not present

## 2020-12-07 DIAGNOSIS — N3091 Cystitis, unspecified with hematuria: Secondary | ICD-10-CM | POA: Diagnosis present

## 2020-12-07 DIAGNOSIS — I110 Hypertensive heart disease with heart failure: Secondary | ICD-10-CM | POA: Diagnosis present

## 2020-12-07 DIAGNOSIS — T17590A Other foreign object in bronchus causing asphyxiation, initial encounter: Secondary | ICD-10-CM | POA: Diagnosis present

## 2020-12-07 DIAGNOSIS — S14105S Unspecified injury at C5 level of cervical spinal cord, sequela: Secondary | ICD-10-CM | POA: Diagnosis not present

## 2020-12-07 DIAGNOSIS — E669 Obesity, unspecified: Secondary | ICD-10-CM | POA: Diagnosis present

## 2020-12-07 DIAGNOSIS — N390 Urinary tract infection, site not specified: Secondary | ICD-10-CM | POA: Diagnosis not present

## 2020-12-07 DIAGNOSIS — R319 Hematuria, unspecified: Secondary | ICD-10-CM | POA: Diagnosis not present

## 2020-12-07 DIAGNOSIS — R918 Other nonspecific abnormal finding of lung field: Secondary | ICD-10-CM | POA: Diagnosis not present

## 2020-12-07 DIAGNOSIS — E785 Hyperlipidemia, unspecified: Secondary | ICD-10-CM | POA: Diagnosis present

## 2020-12-07 DIAGNOSIS — Z87440 Personal history of urinary (tract) infections: Secondary | ICD-10-CM

## 2020-12-07 DIAGNOSIS — A4189 Other specified sepsis: Secondary | ICD-10-CM | POA: Diagnosis not present

## 2020-12-07 DIAGNOSIS — E870 Hyperosmolality and hypernatremia: Secondary | ICD-10-CM | POA: Diagnosis present

## 2020-12-07 DIAGNOSIS — I471 Supraventricular tachycardia: Secondary | ICD-10-CM | POA: Diagnosis not present

## 2020-12-07 DIAGNOSIS — I495 Sick sinus syndrome: Secondary | ICD-10-CM | POA: Diagnosis not present

## 2020-12-07 DIAGNOSIS — B965 Pseudomonas (aeruginosa) (mallei) (pseudomallei) as the cause of diseases classified elsewhere: Secondary | ICD-10-CM | POA: Diagnosis not present

## 2020-12-07 DIAGNOSIS — N319 Neuromuscular dysfunction of bladder, unspecified: Secondary | ICD-10-CM | POA: Diagnosis present

## 2020-12-07 DIAGNOSIS — Z7401 Bed confinement status: Secondary | ICD-10-CM

## 2020-12-07 DIAGNOSIS — Z79899 Other long term (current) drug therapy: Secondary | ICD-10-CM

## 2020-12-07 DIAGNOSIS — L89623 Pressure ulcer of left heel, stage 3: Secondary | ICD-10-CM | POA: Diagnosis present

## 2020-12-07 DIAGNOSIS — D72819 Decreased white blood cell count, unspecified: Secondary | ICD-10-CM | POA: Diagnosis present

## 2020-12-07 DIAGNOSIS — Z8249 Family history of ischemic heart disease and other diseases of the circulatory system: Secondary | ICD-10-CM

## 2020-12-07 DIAGNOSIS — Z931 Gastrostomy status: Secondary | ICD-10-CM

## 2020-12-07 DIAGNOSIS — A4181 Sepsis due to Enterococcus: Secondary | ICD-10-CM | POA: Diagnosis not present

## 2020-12-07 DIAGNOSIS — I4949 Other premature depolarization: Secondary | ICD-10-CM | POA: Diagnosis not present

## 2020-12-07 LAB — CBC WITH DIFFERENTIAL/PLATELET
Abs Immature Granulocytes: 0 10*3/uL (ref 0.00–0.07)
Basophils Absolute: 0 10*3/uL (ref 0.0–0.1)
Basophils Relative: 0 %
Eosinophils Absolute: 0.1 10*3/uL (ref 0.0–0.5)
Eosinophils Relative: 6 %
HCT: 36.1 % — ABNORMAL LOW (ref 39.0–52.0)
Hemoglobin: 11.1 g/dL — ABNORMAL LOW (ref 13.0–17.0)
Immature Granulocytes: 0 %
Lymphocytes Relative: 27 %
Lymphs Abs: 0.5 10*3/uL — ABNORMAL LOW (ref 0.7–4.0)
MCH: 29.3 pg (ref 26.0–34.0)
MCHC: 30.7 g/dL (ref 30.0–36.0)
MCV: 95.3 fL (ref 80.0–100.0)
Monocytes Absolute: 0 10*3/uL — ABNORMAL LOW (ref 0.1–1.0)
Monocytes Relative: 2 %
Neutro Abs: 1.3 10*3/uL — ABNORMAL LOW (ref 1.7–7.7)
Neutrophils Relative %: 65 %
Platelets: 213 10*3/uL (ref 150–400)
RBC: 3.79 MIL/uL — ABNORMAL LOW (ref 4.22–5.81)
RDW: 16.5 % — ABNORMAL HIGH (ref 11.5–15.5)
Smear Review: NORMAL
WBC Morphology: ABNORMAL
WBC: 2 10*3/uL — ABNORMAL LOW (ref 4.0–10.5)
nRBC: 2.5 % — ABNORMAL HIGH (ref 0.0–0.2)

## 2020-12-07 LAB — COMPREHENSIVE METABOLIC PANEL
ALT: 26 U/L (ref 0–44)
AST: 32 U/L (ref 15–41)
Albumin: 3.1 g/dL — ABNORMAL LOW (ref 3.5–5.0)
Alkaline Phosphatase: 179 U/L — ABNORMAL HIGH (ref 38–126)
Anion gap: 8 (ref 5–15)
BUN: 52 mg/dL — ABNORMAL HIGH (ref 8–23)
CO2: 26 mmol/L (ref 22–32)
Calcium: 10.2 mg/dL (ref 8.9–10.3)
Chloride: 119 mmol/L — ABNORMAL HIGH (ref 98–111)
Creatinine, Ser: 1.02 mg/dL (ref 0.61–1.24)
GFR, Estimated: >60 mL/min (ref >60)
Glucose, Bld: 147 mg/dL — ABNORMAL HIGH (ref 70–99)
Potassium: 3.5 mmol/L (ref 3.5–5.1)
Sodium: 153 mmol/L — ABNORMAL HIGH (ref 135–145)
Total Bilirubin: 1.2 mg/dL (ref 0.3–1.2)
Total Protein: 8.4 g/dL — ABNORMAL HIGH (ref 6.5–8.1)

## 2020-12-07 LAB — MICROSCOPIC EXAMINATION: Epithelial Cells (non renal): NONE SEEN /hpf (ref 0–10)

## 2020-12-07 LAB — URINALYSIS, ROUTINE W REFLEX MICROSCOPIC
Bilirubin, UA: NEGATIVE
Glucose, UA: NEGATIVE
Ketones, UA: NEGATIVE
Nitrite, UA: NEGATIVE
Specific Gravity, UA: 1.015 (ref 1.005–1.030)
Urobilinogen, Ur: 0.2 mg/dL (ref 0.2–1.0)
pH, UA: 7 (ref 5.0–7.5)

## 2020-12-07 LAB — GLUCOSE, CAPILLARY
Glucose-Capillary: 130 mg/dL — ABNORMAL HIGH (ref 70–99)
Glucose-Capillary: 130 mg/dL — ABNORMAL HIGH (ref 70–99)

## 2020-12-07 LAB — URINALYSIS, COMPLETE (UACMP) WITH MICROSCOPIC
Bilirubin Urine: NEGATIVE
Glucose, UA: NEGATIVE mg/dL
Ketones, ur: NEGATIVE mg/dL
Nitrite: NEGATIVE
Protein, ur: 100 mg/dL — AB
Specific Gravity, Urine: 1.011 (ref 1.005–1.030)
Squamous Epithelial / HPF: NONE SEEN (ref 0–5)
WBC, UA: >50 WBC/hpf — ABNORMAL HIGH (ref 0–5)
pH: 6 (ref 5.0–8.0)

## 2020-12-07 LAB — MRSA PCR SCREENING: MRSA by PCR: POSITIVE — AB

## 2020-12-07 LAB — LACTIC ACID, PLASMA
Lactic Acid, Venous: 2.8 mmol/L (ref 0.5–1.9)
Lactic Acid, Venous: 4.1 mmol/L (ref 0.5–1.9)
Lactic Acid, Venous: 4 mmol/L (ref 0.5–1.9)

## 2020-12-07 LAB — RESP PANEL BY RT-PCR (FLU A&B, COVID) ARPGX2
Influenza A by PCR: NEGATIVE
Influenza B by PCR: NEGATIVE
SARS Coronavirus 2 by RT PCR: NEGATIVE

## 2020-12-07 LAB — PROCALCITONIN: Procalcitonin: 0.23 ng/mL

## 2020-12-07 IMAGING — DX DG CHEST 1V PORT
1 series · 1 of 1 positions shown · non-contrast
Comparison: Chest x-ray dated [DATE].

CLINICAL DATA: Weakness and tachycardia.

EXAM:
PORTABLE CHEST 1 VIEW

[chest ap]
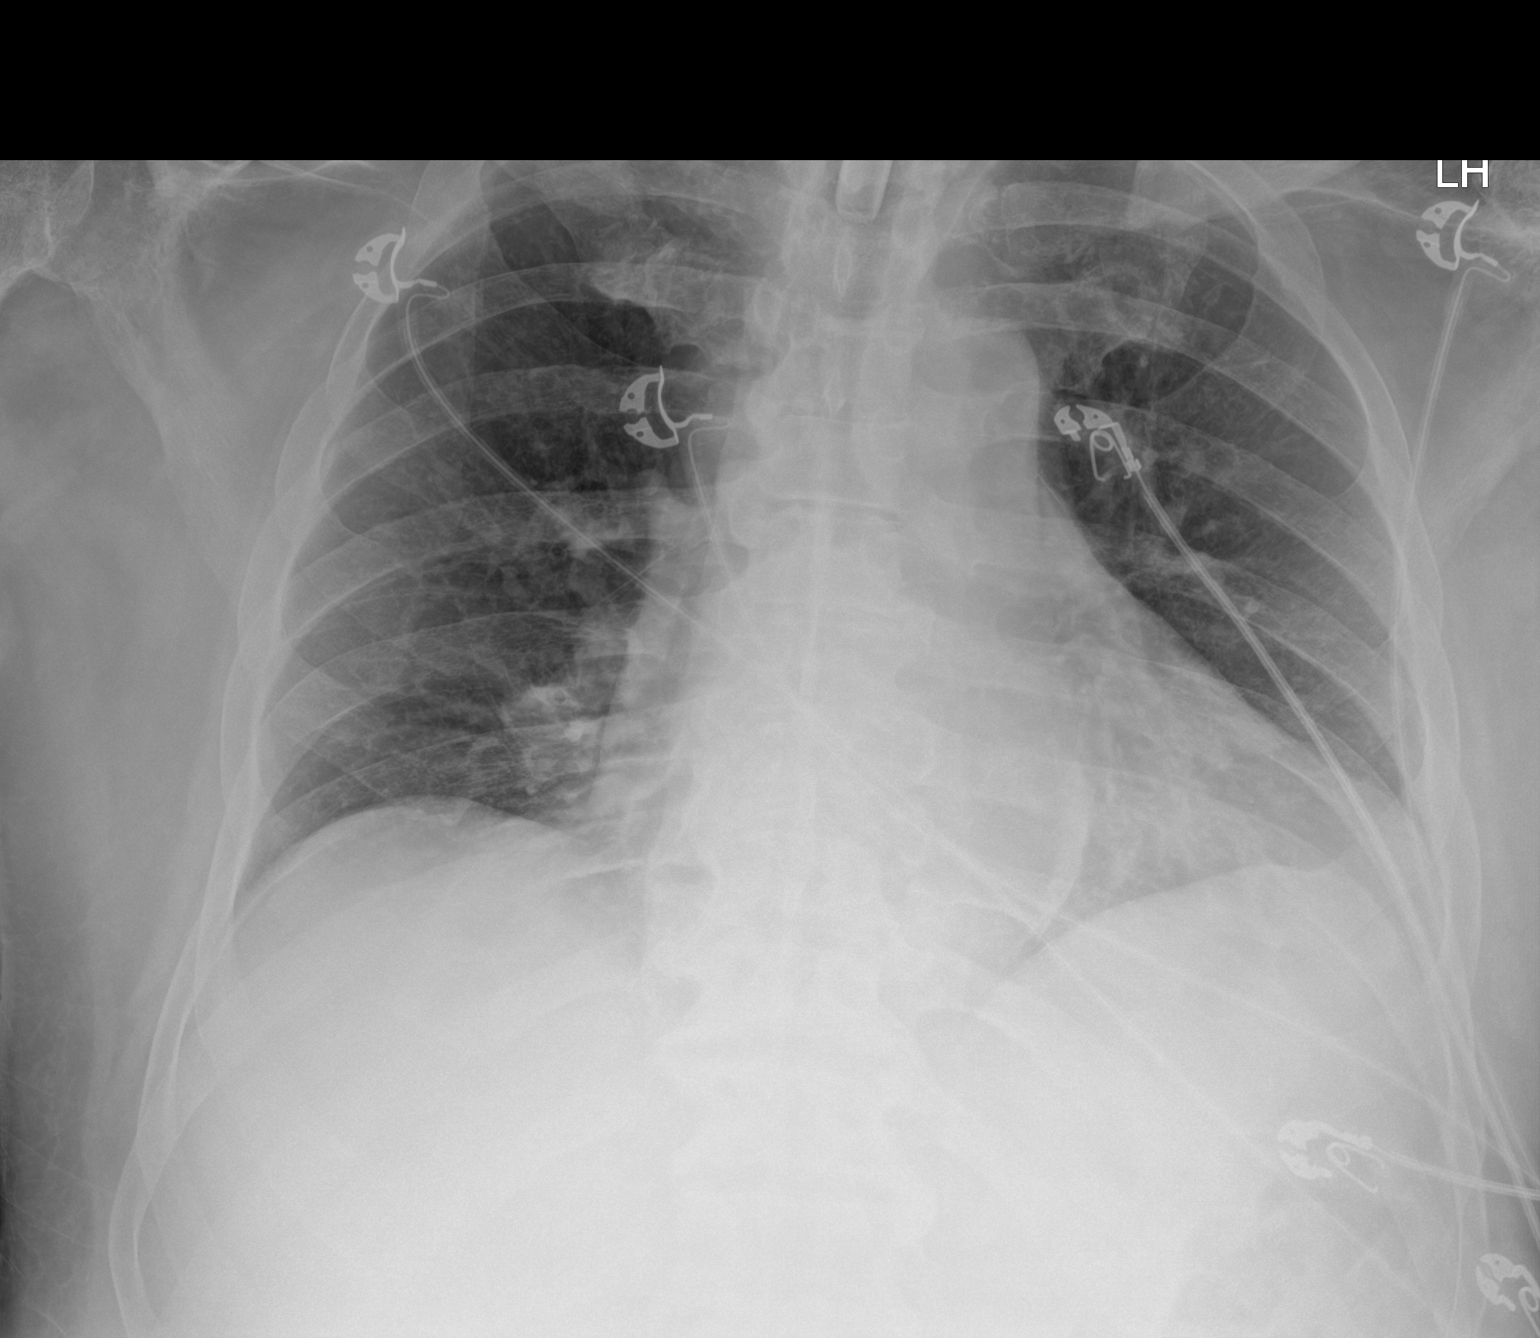

[1 of 1 positions shown; findings below may reference images not displayed]

FINDINGS: Unchanged tracheostomy tube. Stable cardiomediastinal silhouette.
Normal pulmonary vascularity. No focal consolidation, pleural
effusion, or pneumothorax. No acute osseous abnormality.
IMPRESSION: No active disease.

## 2020-12-07 IMAGING — CT CT ABD-PELV W/ CM
2 of 5 series · 15 of 46 positions shown, 17 images · IV contrast (omnipaque)
Comparison: None.

CLINICAL DATA: Hematuria and weakness.

EXAM:
CT ABDOMEN AND PELVIS WITH CONTRAST
TECHNIQUE: Multidetector CT imaging of the abdomen and pelvis was performed
using the standard protocol following bolus administration of
intravenous contrast.
CONTRAST:  100mL OMNIPAQUE IOHEXOL 300 MG/ML  SOLN

[Series 2: axial st · axial · 0.88mm/px · z∈[-460,+25]mm · 12 of 109 slices shown, 14 images]
[im 6/109  soft-tissue]
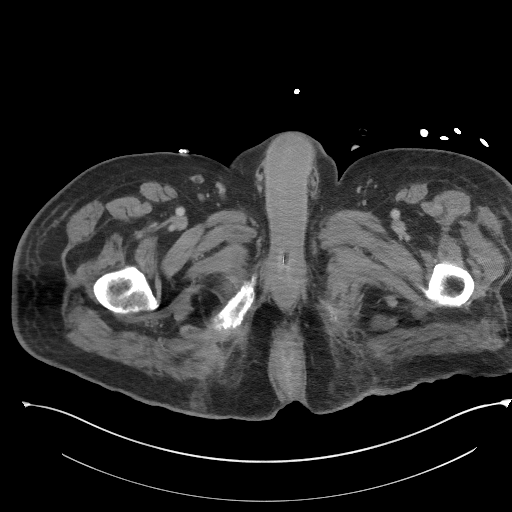
[im 6/109  bone]
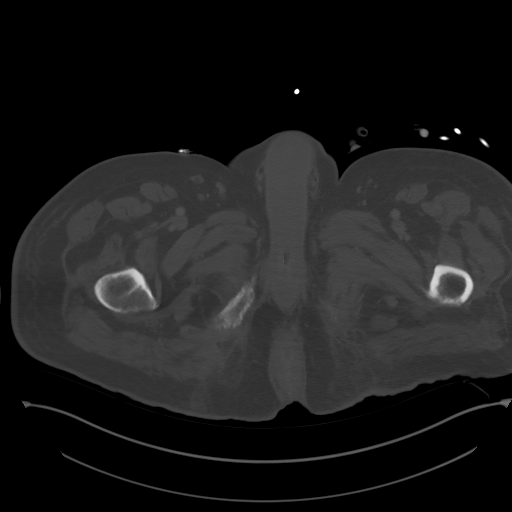
[im 17/109  soft-tissue]
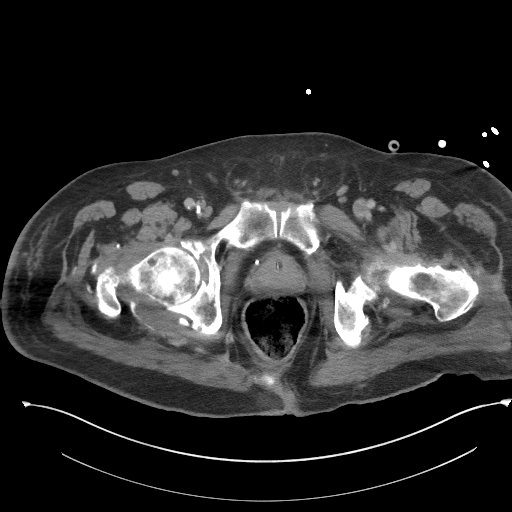
[im 22/109  soft-tissue]
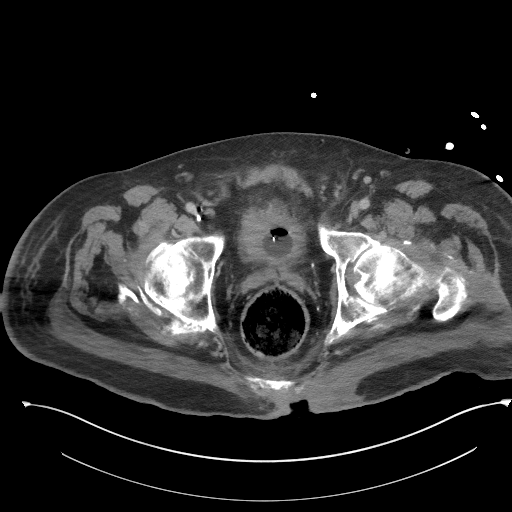
[im 33/109  soft-tissue]
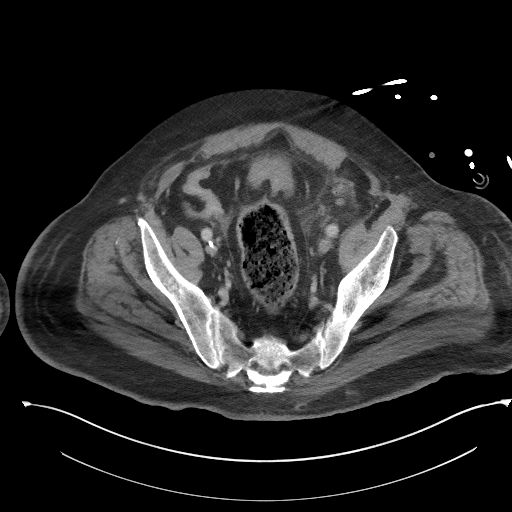
[im 44/109  soft-tissue]
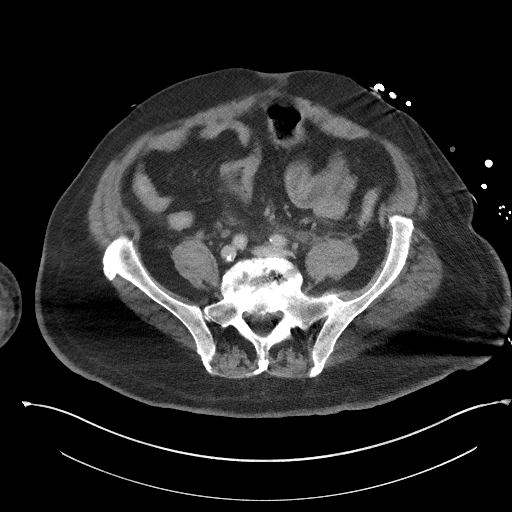
[im 49/109  soft-tissue]
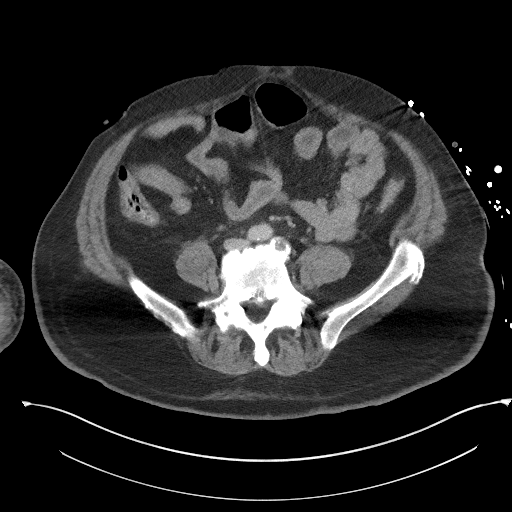
[im 60/109  soft-tissue]
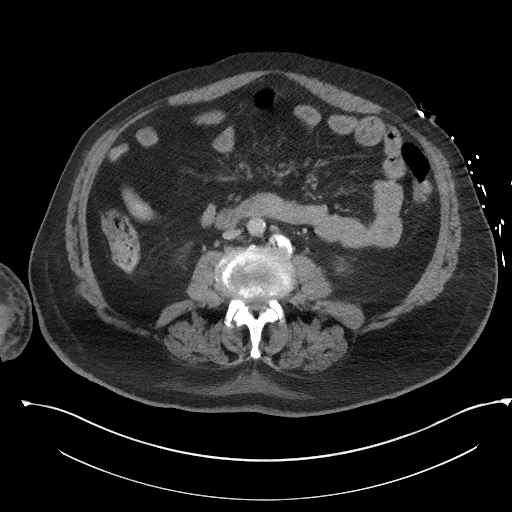
[im 65/109  soft-tissue]
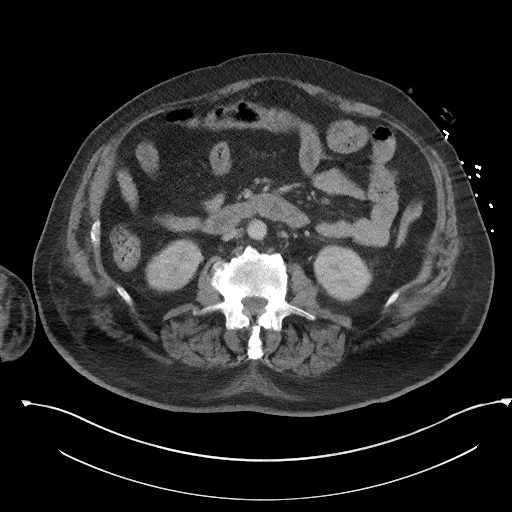
[im 76/109  soft-tissue]
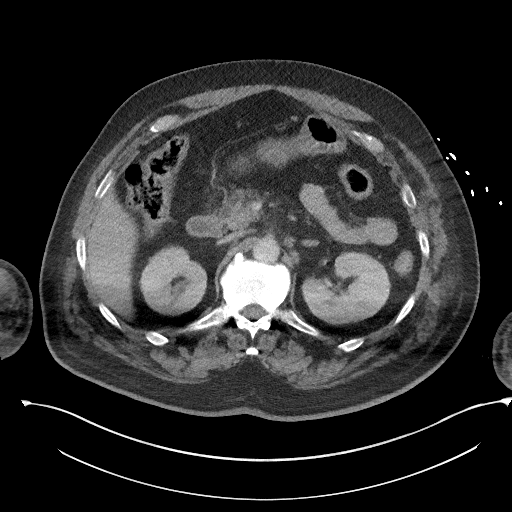
[im 76/109  bone]
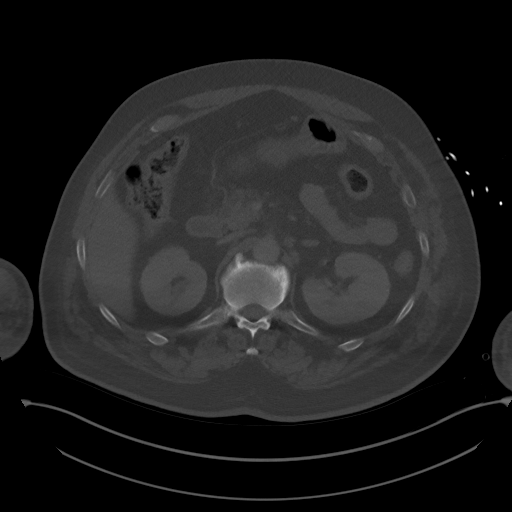
[im 87/109  soft-tissue]
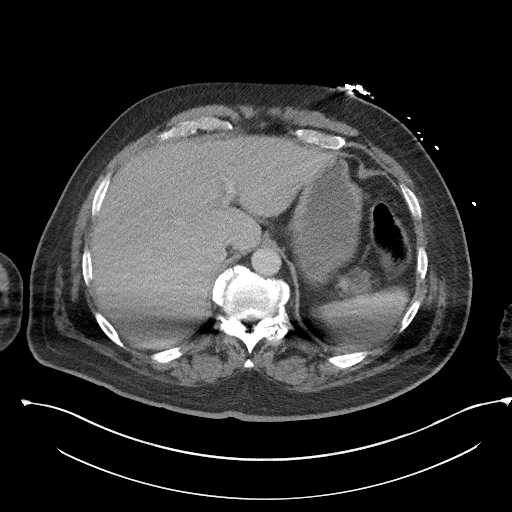
[im 92/109  soft-tissue]
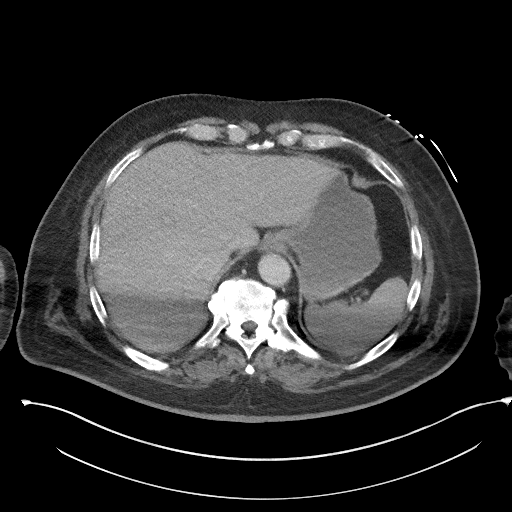
[im 103/109  soft-tissue]
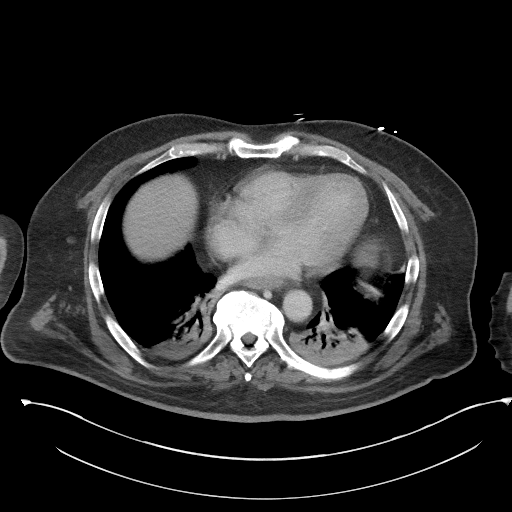

[Series 5: coronal st · coronal · 0.84mm/px · 3 of 103 slices shown]
[im 35/103  soft-tissue]
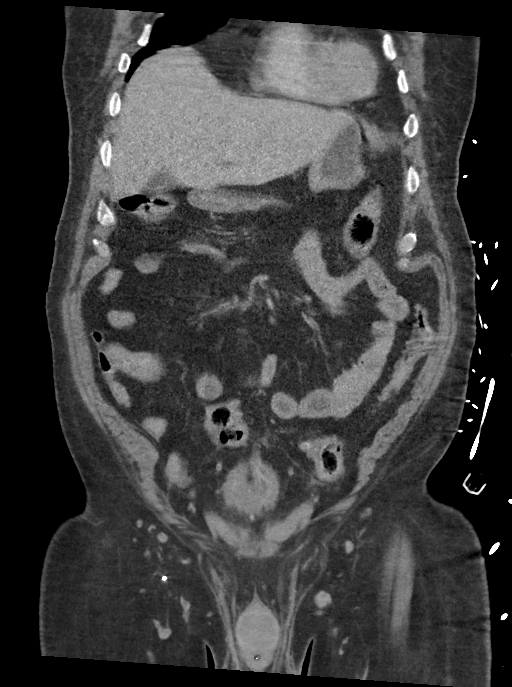
[im 46/103  soft-tissue]
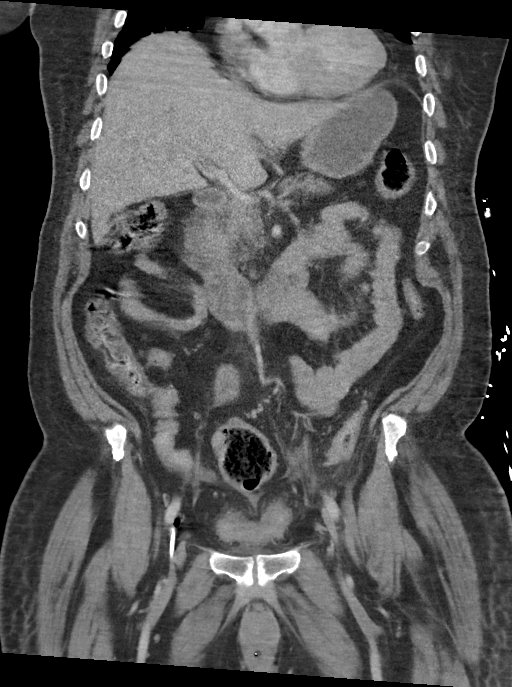
[im 57/103  soft-tissue]
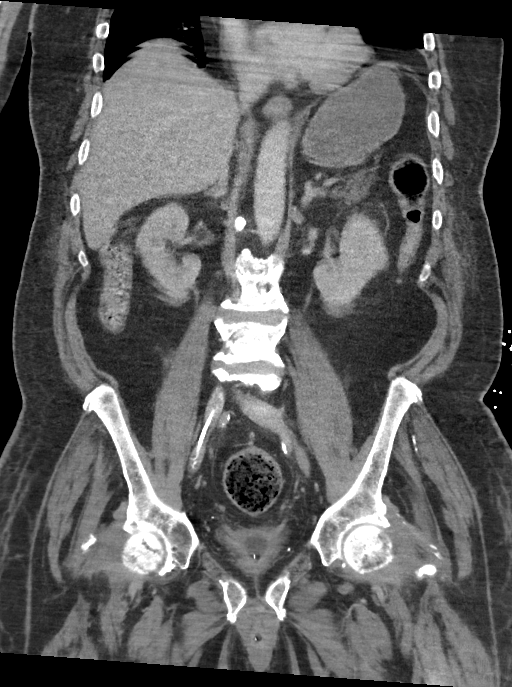

[15 of 46 positions shown; findings below may reference images not displayed]

FINDINGS: Lower chest: Mild to moderate severity areas of consolidation are
seen within the bilateral lung bases.

Hepatobiliary: No focal liver abnormality is seen. No gallstones,
gallbladder wall thickening, or biliary dilatation.

Pancreas: Unremarkable. No pancreatic ductal dilatation or
surrounding inflammatory changes.

Spleen: Normal in size without focal abnormality.

Adrenals/Urinary Tract: Adrenal glands are unremarkable. Kidneys are
normal, without renal calculi, focal lesion, or hydronephrosis. A
Foley catheter is seen within the urinary bladder. Diffuse urinary
bladder wall thickening is also noted with a mild amount of
surrounding inflammatory fat stranding.

Stomach/Bowel: A percutaneous gastrostomy tube is seen with its
distal tip and insufflator bulb noted within the body of the
stomach. Appendix appears normal. No evidence of bowel wall
thickening, distention, or inflammatory changes. Numerous
diverticula are seen throughout the sigmoid colon.

Vascular/Lymphatic: Aortic atherosclerosis. No enlarged abdominal or
pelvic lymph nodes.

Reproductive: Prostate is unremarkable.

Other: No abdominal wall hernia or abnormality. No abdominopelvic
ascites.

Musculoskeletal: A 2.1 cm x 0.9 cm sacral decubitus ulcer is noted,
to the left of midline.

Multilevel degenerative changes seen throughout the lumbar spine.
IMPRESSION: 1. Findings with cystitis. Correlation with urinalysis is
recommended.
2. Mild to moderate severity bibasilar atelectasis and/or
infiltrate.
3.   Sigmoid diverticulosis.
4. Sacral decubitus ulcer, as described above. MRI correlation is
recommended, as sequelae associated with acute osteomyelitis cannot
be excluded.
5. Aortic atherosclerosis.

Aortic Atherosclerosis ([2Q]-[2Q]).

## 2020-12-07 IMAGING — CT CT HEAD W/O CM
4 series · 16 of 47 positions shown, 18 images · non-contrast
Comparison: None.

CLINICAL DATA: Altered mental status.

EXAM:
CT HEAD WITHOUT CONTRAST
TECHNIQUE: Contiguous axial images were obtained from the base of the skull
through the vertex without intravenous contrast.

[Series 2: head wo · axial · 0.46mm/px · z∈[-144,-34]mm · 6 of 32 slices shown, 8 images]
[im 5/32  brain]
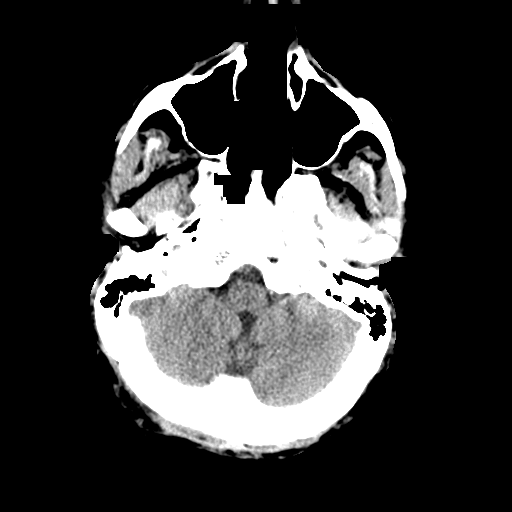
[im 5/32  bone]
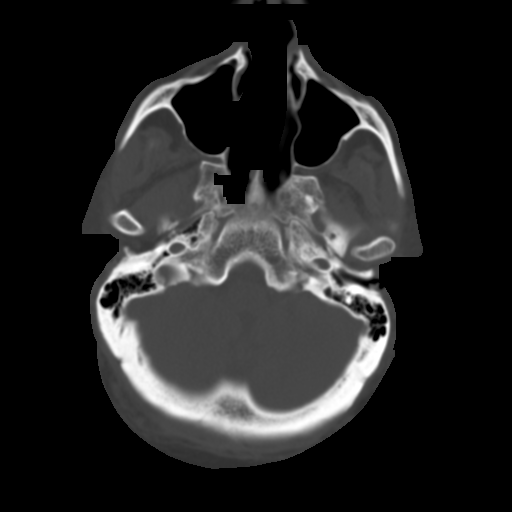
[im 9/32  brain]
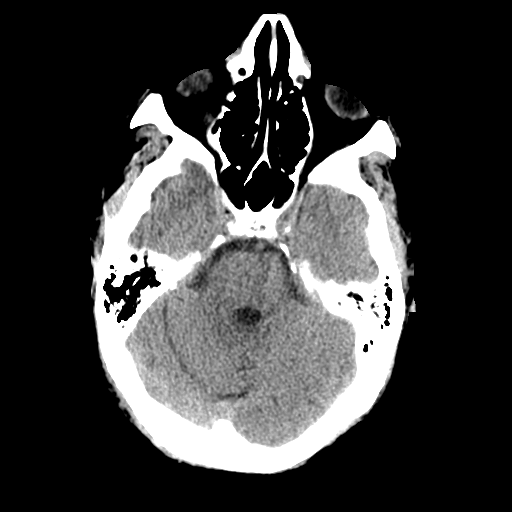
[im 14/32  brain]
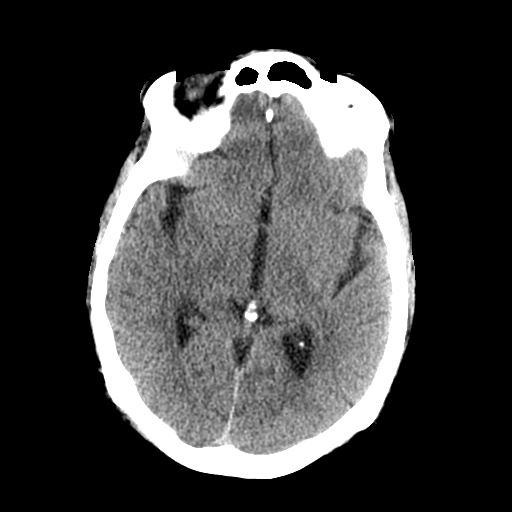
[im 18/32  brain]
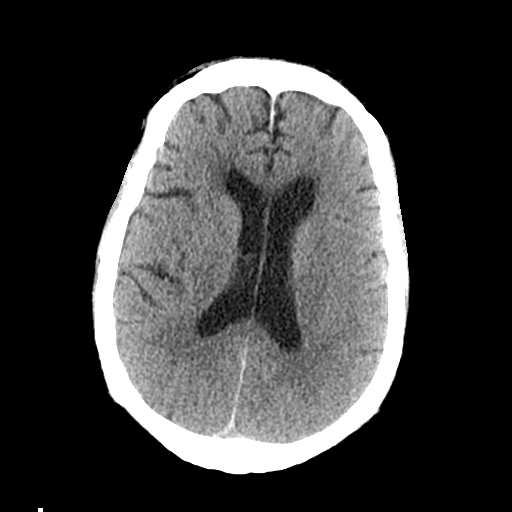
[im 23/32  brain]
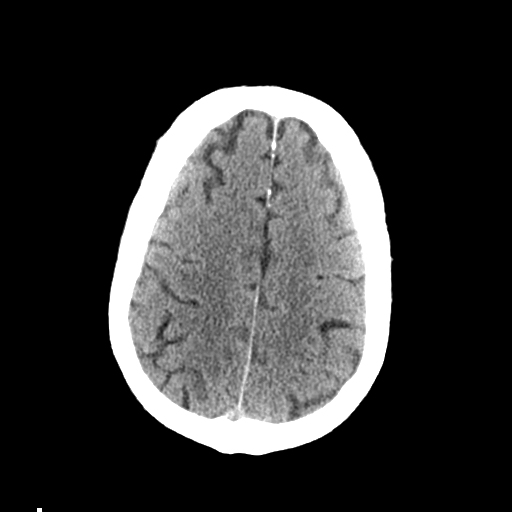
[im 23/32  bone]
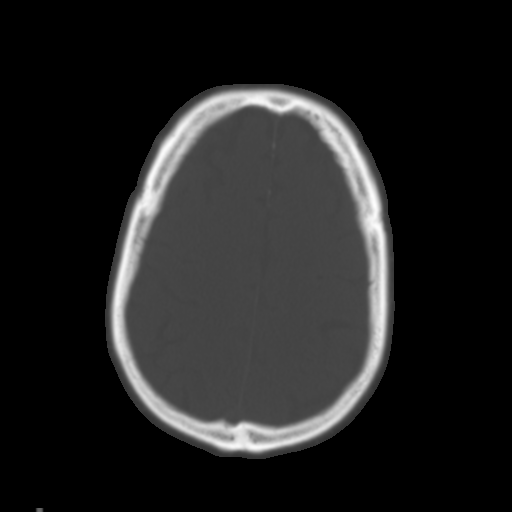
[im 27/32  brain]
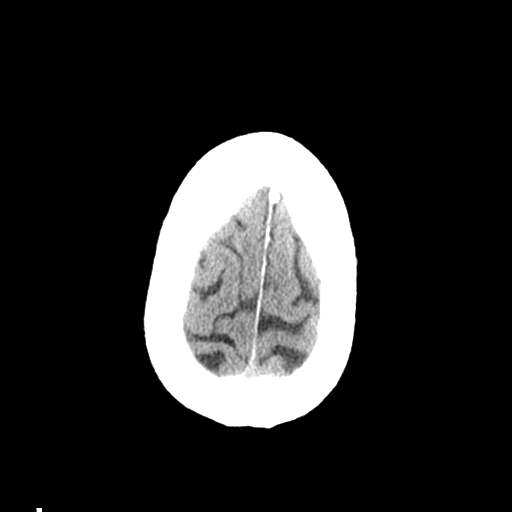

[Series 3: head bone · axial · 0.46mm/px · z∈[-150,-96]mm · 4 of 81 slices shown]
[im 8/81  bone]
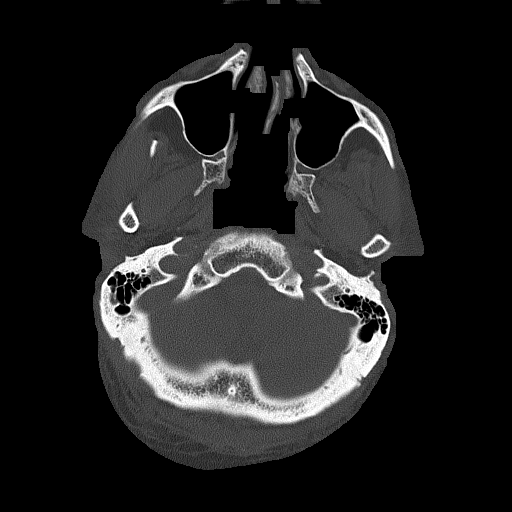
[im 16/81  bone]
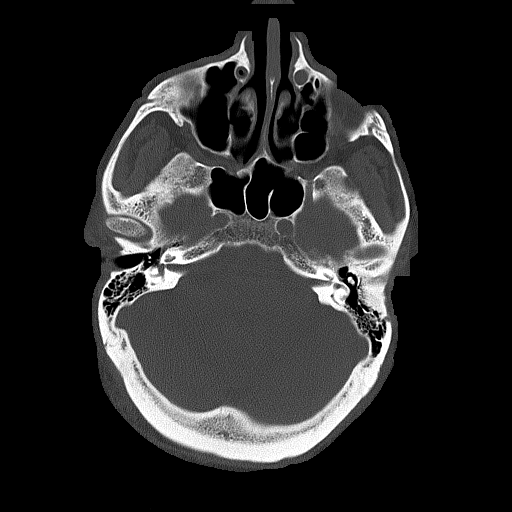
[im 27/81  bone]
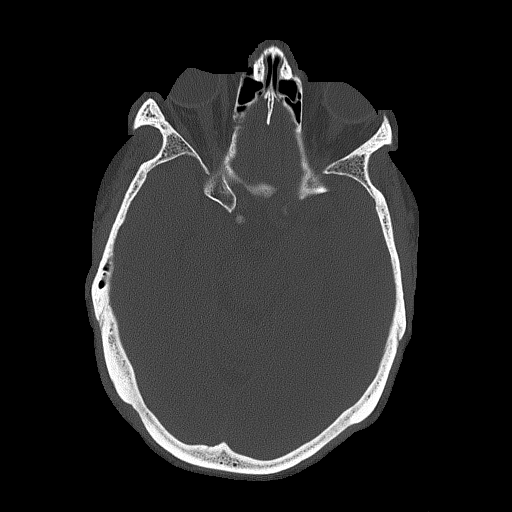
[im 35/81  bone]
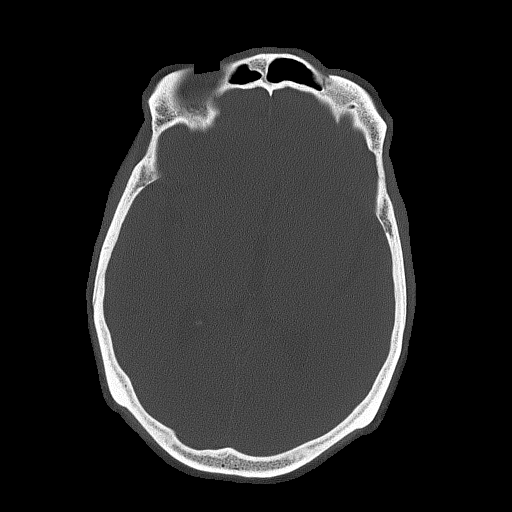

[Series 4: coronal soft tissue · coronal · 0.33mm/px · 3 of 69 slices shown]
[im 23/69  brain]
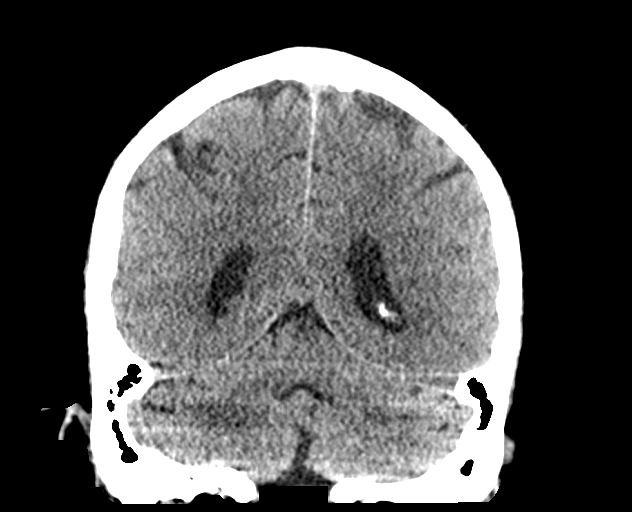
[im 31/69  brain]
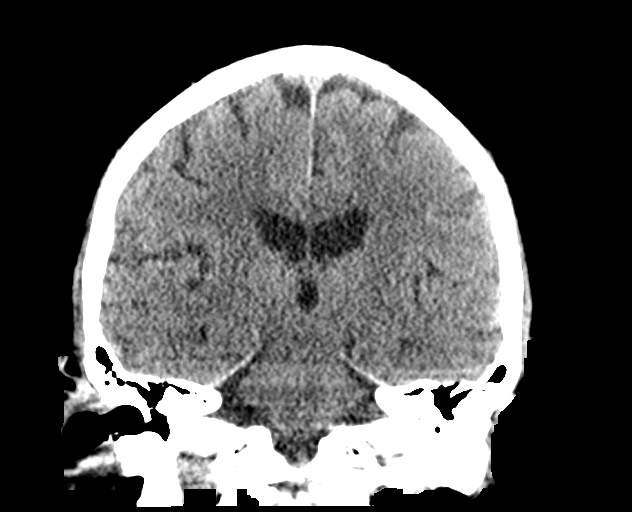
[im 38/69  brain]
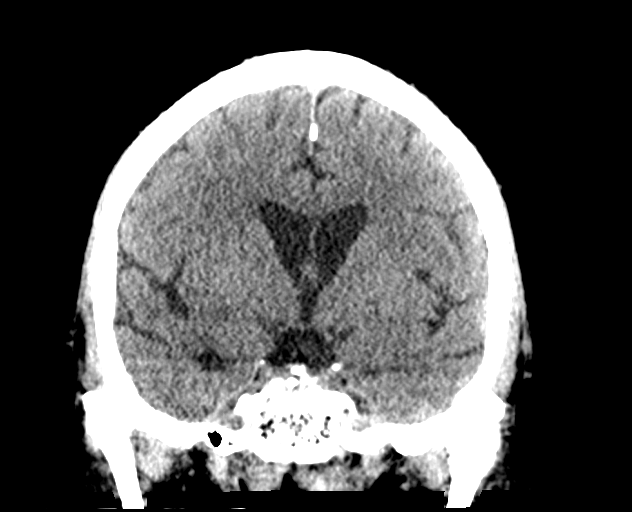

[Series 5: sagittal soft tissue · sagittal · 0.38mm/px · 3 of 55 slices shown]
[im 19/55  brain]
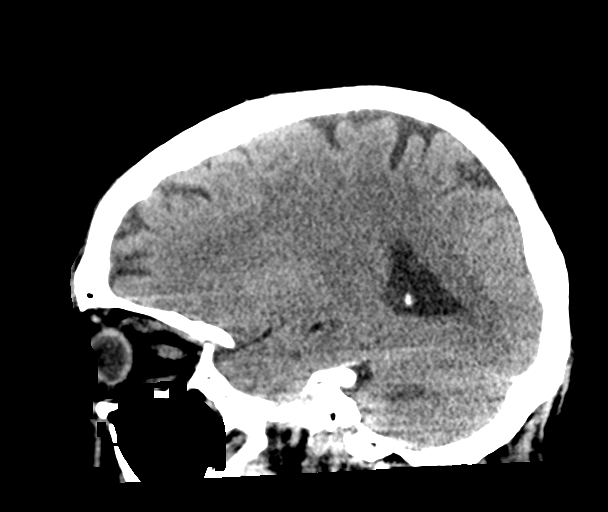
[im 28/55  brain]
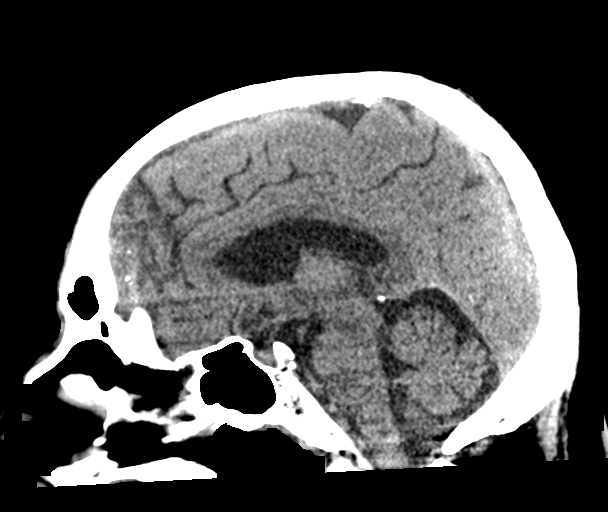
[im 37/55  brain]
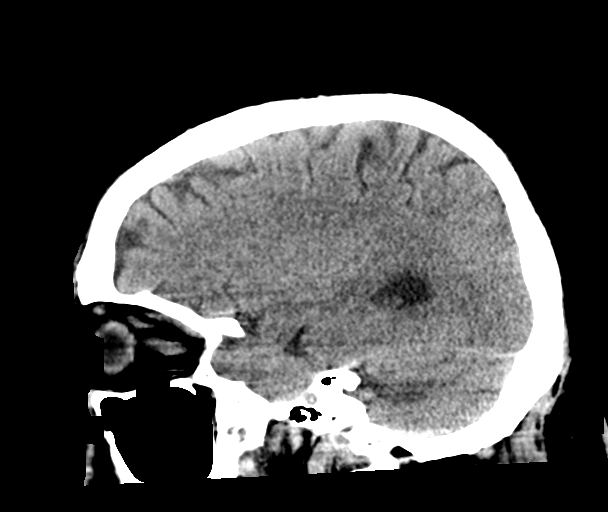

[16 of 47 positions shown; findings below may reference images not displayed]

FINDINGS: Brain: Mild chronic ischemic white matter disease is noted. No mass
effect or midline shift is noted. Ventricular size is within normal
limits. There is no evidence of mass lesion, hemorrhage or acute
infarction.

Vascular: No hyperdense vessel or unexpected calcification.

Skull: Normal. Negative for fracture or focal lesion.

Sinuses/Orbits: No acute finding.

Other: None.
IMPRESSION: Mild chronic ischemic white matter disease. No acute intracranial
abnormality seen.

## 2020-12-07 MED ORDER — SENNOSIDES 8.8 MG/5ML PO SYRP
5.0000 mL | ORAL_SOLUTION | Freq: Every day | ORAL | Status: DC
  Administered 2020-12-09 – 2020-12-25 (×10): 5 mL
  Filled 2020-12-07 (×18): qty 5

## 2020-12-07 MED ORDER — CALCIUM POLYCARBOPHIL 625 MG PO TABS
625.0000 mg | ORAL_TABLET | Freq: Every day | ORAL | Status: DC
  Administered 2020-12-08 – 2021-01-05 (×29): 625 mg
  Filled 2020-12-07 (×35): qty 1

## 2020-12-07 MED ORDER — NOREPINEPHRINE 4 MG/250ML-% IV SOLN
0.0000 ug/min | INTRAVENOUS | Status: DC
  Administered 2020-12-07: 20 ug/min via INTRAVENOUS
  Filled 2020-12-07: qty 250

## 2020-12-07 MED ORDER — MIDODRINE HCL 5 MG PO TABS
10.0000 mg | ORAL_TABLET | Freq: Three times a day (TID) | ORAL | Status: DC
  Administered 2020-12-08 – 2020-12-15 (×24): 10 mg
  Filled 2020-12-07 (×24): qty 2

## 2020-12-07 MED ORDER — ORAL CARE MOUTH RINSE
15.0000 mL | OROMUCOSAL | Status: DC
  Administered 2020-12-07 – 2021-01-05 (×237): 15 mL via OROMUCOSAL

## 2020-12-07 MED ORDER — ENOXAPARIN SODIUM 40 MG/0.4ML ~~LOC~~ SOLN: 40.0000 mg | SUBCUTANEOUS | Status: DC

## 2020-12-07 MED ORDER — SIMETHICONE 40 MG/0.6ML PO SUSP
80.0000 mg | Freq: Four times a day (QID) | ORAL | Status: DC
  Administered 2020-12-07 – 2021-01-05 (×106): 80 mg
  Filled 2020-12-07: qty 1.2
  Filled 2020-12-07 (×2): qty 30
  Filled 2020-12-07 (×12): qty 1.2
  Filled 2020-12-07 (×2): qty 30
  Filled 2020-12-07 (×8): qty 1.2
  Filled 2020-12-07 (×2): qty 30
  Filled 2020-12-07: qty 1.2
  Filled 2020-12-07 (×3): qty 30
  Filled 2020-12-07 (×4): qty 1.2
  Filled 2020-12-07 (×2): qty 30
  Filled 2020-12-07 (×3): qty 1.2
  Filled 2020-12-07: qty 30
  Filled 2020-12-07: qty 1.2
  Filled 2020-12-07: qty 30
  Filled 2020-12-07 (×7): qty 1.2
  Filled 2020-12-07 (×3): qty 30
  Filled 2020-12-07 (×4): qty 1.2
  Filled 2020-12-07: qty 30
  Filled 2020-12-07: qty 1.2
  Filled 2020-12-07: qty 30
  Filled 2020-12-07: qty 1.2
  Filled 2020-12-07: qty 30
  Filled 2020-12-07: qty 1.2
  Filled 2020-12-07: qty 30
  Filled 2020-12-07 (×7): qty 1.2
  Filled 2020-12-07 (×2): qty 30
  Filled 2020-12-07: qty 1.2
  Filled 2020-12-07: qty 30
  Filled 2020-12-07 (×3): qty 1.2
  Filled 2020-12-07 (×3): qty 30
  Filled 2020-12-07 (×4): qty 1.2
  Filled 2020-12-07: qty 30
  Filled 2020-12-07: qty 1.2
  Filled 2020-12-07: qty 30
  Filled 2020-12-07 (×3): qty 1.2
  Filled 2020-12-07: qty 30
  Filled 2020-12-07: qty 1.2
  Filled 2020-12-07 (×2): qty 30
  Filled 2020-12-07 (×3): qty 1.2
  Filled 2020-12-07 (×2): qty 30
  Filled 2020-12-07 (×3): qty 1.2
  Filled 2020-12-07 (×2): qty 30
  Filled 2020-12-07: qty 1.2
  Filled 2020-12-07: qty 30
  Filled 2020-12-07: qty 1.2
  Filled 2020-12-07: qty 30
  Filled 2020-12-07 (×5): qty 1.2
  Filled 2020-12-07: qty 30
  Filled 2020-12-07 (×3): qty 1.2
  Filled 2020-12-07 (×2): qty 30
  Filled 2020-12-07: qty 1.2
  Filled 2020-12-07: qty 30
  Filled 2020-12-07 (×4): qty 1.2

## 2020-12-07 MED ORDER — LACTATED RINGERS IV BOLUS
1000.0000 mL | Freq: Once | INTRAVENOUS | Status: AC
  Administered 2020-12-07: 1000 mL via INTRAVENOUS

## 2020-12-07 MED ORDER — VASOPRESSIN 20 UNITS/100 ML INFUSION FOR SHOCK
0.0000 [IU]/min | INTRAVENOUS | Status: DC
  Administered 2020-12-07 – 2020-12-08 (×3): 0.03 [IU]/min via INTRAVENOUS
  Filled 2020-12-07 (×4): qty 100

## 2020-12-07 MED ORDER — LACTATED RINGERS IV SOLN: INTRAVENOUS | Status: AC

## 2020-12-07 MED ORDER — SACCHAROMYCES BOULARDII 250 MG PO CAPS
250.0000 mg | ORAL_CAPSULE | Freq: Two times a day (BID) | ORAL | Status: DC
  Administered 2020-12-07 – 2021-01-02 (×53): 250 mg
  Filled 2020-12-07 (×55): qty 1

## 2020-12-07 MED ORDER — DICLOFENAC SODIUM 1 % EX GEL
4.0000 g | Freq: Four times a day (QID) | CUTANEOUS | Status: DC
  Administered 2020-12-07 – 2020-12-31 (×82): 4 g via TOPICAL
  Filled 2020-12-07 (×2): qty 100

## 2020-12-07 MED ORDER — DOCUSATE SODIUM 100 MG PO CAPS: 100.0000 mg | ORAL_CAPSULE | Freq: Two times a day (BID) | ORAL | Status: DC | PRN

## 2020-12-07 MED ORDER — LACTATED RINGERS IV BOLUS (SEPSIS)
1000.0000 mL | Freq: Once | INTRAVENOUS | Status: AC
  Administered 2020-12-07: 1000 mL via INTRAVENOUS

## 2020-12-07 MED ORDER — VANCOMYCIN HCL IN DEXTROSE 1-5 GM/200ML-% IV SOLN
1000.0000 mg | Freq: Once | INTRAVENOUS | Status: AC
  Administered 2020-12-07: 1000 mg via INTRAVENOUS
  Filled 2020-12-07: qty 200

## 2020-12-07 MED ORDER — GUAIFENESIN 100 MG/5ML PO SOLN
400.0000 mg | Freq: Four times a day (QID) | ORAL | Status: DC
  Administered 2020-12-07 – 2021-01-01 (×97): 400 mg
  Filled 2020-12-07 (×103): qty 20

## 2020-12-07 MED ORDER — FLUOXETINE HCL 20 MG PO CAPS
40.0000 mg | ORAL_CAPSULE | Freq: Every day | ORAL | Status: DC
  Administered 2020-12-08 – 2021-01-05 (×29): 40 mg
  Filled 2020-12-07 (×30): qty 2

## 2020-12-07 MED ORDER — CHLORHEXIDINE GLUCONATE 0.12% ORAL RINSE (MEDLINE KIT)
15.0000 mL | Freq: Two times a day (BID) | OROMUCOSAL | Status: DC
  Administered 2020-12-07 – 2021-01-05 (×56): 15 mL via OROMUCOSAL

## 2020-12-07 MED ORDER — FERROUS SULFATE 300 (60 FE) MG/5ML PO SYRP
75.0000 mg | ORAL_SOLUTION | Freq: Every day | ORAL | Status: DC
  Filled 2020-12-07: qty 5

## 2020-12-07 MED ORDER — NOREPINEPHRINE 4 MG/250ML-% IV SOLN
INTRAVENOUS | Status: AC
  Administered 2020-12-07: 10 ug/min via INTRAVENOUS
  Filled 2020-12-07: qty 250

## 2020-12-07 MED ORDER — PIPERACILLIN-TAZOBACTAM 3.375 G IVPB
3.3750 g | Freq: Three times a day (TID) | INTRAVENOUS | Status: DC
  Administered 2020-12-07: 3.375 g via INTRAVENOUS
  Filled 2020-12-07: qty 50

## 2020-12-07 MED ORDER — ONDANSETRON HCL 4 MG/2ML IJ SOLN
4.0000 mg | Freq: Four times a day (QID) | INTRAMUSCULAR | Status: DC | PRN
  Filled 2020-12-07: qty 2

## 2020-12-07 MED ORDER — NOREPINEPHRINE 4 MG/250ML-% IV SOLN: 10.0000 ug/min | INTRAVENOUS | Status: DC

## 2020-12-07 MED ORDER — FREE WATER
100.0000 mL | Status: DC
  Administered 2020-12-07 – 2020-12-08 (×2): 100 mL

## 2020-12-07 MED ORDER — BISACODYL 10 MG RE SUPP
10.0000 mg | Freq: Every day | RECTAL | Status: DC
  Administered 2020-12-07 – 2020-12-14 (×7): 10 mg via RECTAL
  Filled 2020-12-07 (×7): qty 1

## 2020-12-07 MED ORDER — FERROUS SULFATE 220 (44 FE) MG/5ML PO ELIX
75.0000 mg | ORAL_SOLUTION | Freq: Every day | ORAL | Status: DC
  Administered 2020-12-08 – 2021-01-05 (×29): 75 mg
  Filled 2020-12-07 (×3): qty 1.7
  Filled 2020-12-07: qty 1.71
  Filled 2020-12-07: qty 1.7
  Filled 2020-12-07 (×4): qty 1.71
  Filled 2020-12-07 (×2): qty 1.7
  Filled 2020-12-07 (×12): qty 1.71
  Filled 2020-12-07 (×2): qty 1.7
  Filled 2020-12-07 (×4): qty 1.71
  Filled 2020-12-07 (×2): qty 1.7

## 2020-12-07 MED ORDER — POLYETHYLENE GLYCOL 3350 17 G PO PACK
17.0000 g | PACK | Freq: Every day | ORAL | Status: DC | PRN
  Administered 2020-12-30: 17 g via ORAL
  Filled 2020-12-07: qty 1

## 2020-12-07 MED ORDER — ACETAMINOPHEN 160 MG/5ML PO SOLN
320.0000 mg | ORAL | Status: DC | PRN
  Administered 2020-12-08 – 2020-12-19 (×8): 640 mg
  Filled 2020-12-07 (×10): qty 20.3

## 2020-12-07 MED ORDER — IOHEXOL 300 MG/ML  SOLN
100.0000 mL | Freq: Once | INTRAMUSCULAR | Status: AC | PRN
  Administered 2020-12-07: 100 mL via INTRAVENOUS

## 2020-12-07 MED ORDER — FLUDROCORTISONE ACETATE 0.1 MG PO TABS
200.0000 ug | ORAL_TABLET | Freq: Every day | ORAL | Status: DC
  Filled 2020-12-07: qty 2

## 2020-12-07 MED ORDER — RIVAROXABAN 10 MG PO TABS
10.0000 mg | ORAL_TABLET | Freq: Every day | ORAL | Status: DC
  Administered 2020-12-08 – 2021-01-05 (×29): 10 mg
  Filled 2020-12-07 (×30): qty 1

## 2020-12-07 MED ORDER — ZINC SULFATE 220 (50 ZN) MG PO CAPS
220.0000 mg | ORAL_CAPSULE | Freq: Every day | ORAL | Status: DC
  Administered 2020-12-08 – 2021-01-02 (×26): 220 mg
  Filled 2020-12-07 (×26): qty 1

## 2020-12-07 MED ORDER — FAMOTIDINE IN NACL 20-0.9 MG/50ML-% IV SOLN
20.0000 mg | INTRAVENOUS | Status: DC
  Administered 2020-12-07 – 2021-01-02 (×26): 20 mg via INTRAVENOUS
  Filled 2020-12-07 (×28): qty 50

## 2020-12-07 MED ORDER — CHLORHEXIDINE GLUCONATE CLOTH 2 % EX PADS
6.0000 | MEDICATED_PAD | Freq: Every day | CUTANEOUS | Status: DC
  Administered 2020-12-07 – 2021-01-04 (×16): 6 via TOPICAL

## 2020-12-07 MED ORDER — SODIUM CHLORIDE 0.9 % IV SOLN
2.0000 g | Freq: Once | INTRAVENOUS | Status: AC
  Administered 2020-12-07: 2 g via INTRAVENOUS
  Filled 2020-12-07: qty 2

## 2020-12-07 MED ORDER — ASCORBIC ACID 500 MG PO TABS
500.0000 mg | ORAL_TABLET | Freq: Two times a day (BID) | ORAL | Status: DC
  Administered 2020-12-07 – 2021-01-05 (×58): 500 mg
  Filled 2020-12-07 (×59): qty 1

## 2020-12-07 NOTE — Consult Note (Signed)
PHARMACY -  BRIEF ANTIBIOTIC NOTE   Pharmacy has received consult(s) for vancomycin and cefepime from an ED provider.  The patient's profile has been reviewed for ht/wt/allergies/indication/available labs.    One time order(s) placed for --Cefepime 2 g  --Vancomycin 1 g  Further antibiotics/pharmacy consults should be ordered by admitting physician if indicated.                       Thank you, Benita Gutter 12/07/2020  6:10 PM

## 2020-12-07 NOTE — ED Notes (Addendum)
Trach changed by respiratory to 37mm cuffed, positive color change, breath sounds audible bilaterally per respiratory.

## 2020-12-07 NOTE — ED Notes (Signed)
Respiratory maintaining at bedside, assisting respirations via BVM through trach.

## 2020-12-07 NOTE — ED Notes (Signed)
Respiratory and Hinton Dyer NP at bedside preparing to exchange trach. Hinton Dyer NP advising need to raise BP prior to trach exchange, Dana NP at bedside titrating levo drip.

## 2020-12-07 NOTE — Progress Notes (Signed)
70 yo M  admitted to ED w/ home trach hypoxic on trach collar despite receiving 100% fio2. Pt bagged w/ some improvement in fio2. bbs decreased b/l. ICU @ bedside, decision made to place pt on vent. 6 shiley cuffless removed and replaced w/ 6 shiley cuffed. (+) etco2 change. bbs decreased but, noted. sats improved pt placed on charted vent support w/o issue. Pt transported to CT then ICU w/o issue, report given to covering therapist. All questions answered pt care turned over pt left in [place].

## 2020-12-07 NOTE — ED Notes (Addendum)
O2 saturation remaining in the 80s, respiratory at bedside, assisting respirations via BVM through trach.

## 2020-12-07 NOTE — Consult Note (Signed)
Pharmacy Antibiotic Note  Ryan Day is a 70 y.o. 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 Urine culture from 11/10/20 with MDR PSA which was piperacillin susceptible. Urine culture from 10/18/20 also with MDR PSA with different resistance patterns but also piperacillin susceptible. Pharmacy has been consulted for Zosyn dosing.   Plan: Zosyn 3.375g IV q8h (4 hour infusion).   Continue to monitor culture results and ability to narrow antibiotics  Height: 6\' 5"  (195.6 cm) Weight: 114 kg (251 lb 5.2 oz) IBW/kg (Calculated) : 89.1  Temp (24hrs), Avg:100.9 F (38.3 C), Min:97.1 F (36.2 C), Max:101.84 F (38.8 C)  Recent Labs  Lab 12/07/20 1725 12/07/20 1933  WBC 2.0*  --   CREATININE 1.02  --   LATICACIDVEN 2.8* 4.1*    Estimated Creatinine Clearance: 97.2 mL/min (by C-G formula based on SCr of 1.02 mg/dL).    No Known Allergies  Antimicrobials this admission: Vancomycin 3/17 x 1 Cefepime 3/17 x 1 Zosyn 3/17 >>   Dose adjustments this admission: N/A  Microbiology results: 3/17 BCx: pending 3/17 UCx: pending  3/17 MRSA PCR: pending  Thank you for allowing pharmacy to be a part of this patient's care.  Benita Gutter 12/07/2020 10:01 PM

## 2020-12-07 NOTE — ED Notes (Signed)
EDp at bedside, attempting [place] IV access.

## 2020-12-07 NOTE — Progress Notes (Addendum)
[facility] Progress Note Patient Name: Ryan Day DOB: [birth year] MRN: [id number]   Date of Service  12/07/2020  HPI/Events of Note  Patient admitted with septic shock of presumed UTI + / - Pneumonia origin, he also has decubitus wounds, he is a paraplegic with a tracheostomy. Patient also has acute on chronic respiratory failure secondary to pneumonia vs atelectasis.  eICU Interventions  New patient Evaluation completed.        [name] 12/07/2020, 11:07 PM

## 2020-12-07 NOTE — ED Notes (Signed)
MD at bedside to attempt central line access.

## 2020-12-07 NOTE — ED Notes (Signed)
RT placed trach collar O2 on pt, sats increased to low 90's

## 2020-12-07 NOTE — Telephone Encounter (Signed)
  Wife is calling for lab result- advised pending at this time. Reason for Disposition . Caller requesting lab results  Answer Assessment - Initial Assessment Questions 1. REASON FOR CALL or QUESTION: "What is your reason for calling today?" or "How can I best help you?" or "What question do you have that I can help answer?"     Urine results collected today 2. CALLER: Document the source of call. (e.g., laboratory, patient).     Patient/wife  Protocols used: PCP CALL - NO TRIAGE-A-AH

## 2020-12-07 NOTE — H&P (Addendum)
NAME:  Ryan Day, MRN:  [id number], DOB:  1951-01-20, LOS: 0 ADMISSION DATE:  12/07/2020, CONSULTATION DATE: 12/07/2020 REFERRING MD: Dr. Tamala Julian, CHIEF COMPLAINT: Hematuria   History of Present Illness:  This is a 70 yo quadriplegic male with a chronic tracheostomy who presented to [facility] ER on 03/17 with hematuria.  According to pts family last week pt noted to have sediment in his urine at home concerning for UTI.  He had a foley catheter placed 2 weeks ago and completed a 7 day course of abx therapy due to UTI.  Due to concerns of recurrent UTI pts family contacted pts PCP office last week, and heard from his PCP this week and were awaiting urine culture results.  However, pt noted to have hematuria yesterday and today along with sinus tachycardia hr 120's.  Pts daughter is a Marine scientist and due to concern of sepsis pt transported to the ER via EMS for further evaluation and treatment.    ED course Upon arrival to the ER pts O2 sats were 75-80% on RA, therefore pt placed on NRB with O2 sats initially increasing to 100%.  Pt transitioned to trach collar with initial O2 sats in the low 90's.  However, pt noted to have mucous plugging resulting in hypoxia requiring exchange of tracheostomy from a cuffless trach to a size 6 mm cuffed trach and pt placed on mechanical ventilation.  ER vital signs were: temp 101.7 rectally, sbp 60-80's, and hr 130's. Lab results revealed Na+ 153, chloride 119, glucose 147, lactic acid 2.8, wbc 2.0, hbg 11.1, and UA positive for UTI.  CXR negative and COVID-19/Influenza PCR results pending.  Sepsis protocol initiated and pt received cefepime, vancomycin, and 2L LR bolus.  Pt remained severely hypotensive requiring levophed gtt and right femoral central line placement.  PCCM team contacted for ICU admission.     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 C5 Cervical Fracture (following a mechanical fall at home)  s/p C3-6 Laminectomy and Posterior Instrumentation and Fusion 08/4  Quadriplegia  Spinal Cord Compression  Chronic Tracheostomy and PEG    Significant Hospital Events: Including procedures, antibiotic start and stop dates in addition to other pertinent events   03/17: Pt admitted to ICU with urosepsis requiring vasopressors and acute on chronic hypoxic respiratory failure secondary to mucous plugging requiring exchange of cuffless trach to size 6 mm cuffed trach to be placed on mechanical ventilation  03/17: Right femoral central line placed by ER physician  03/17: CT Abd Pelvis findings with cystitis. Correlation with urinalysis is Recommended. Mild to moderate severity bibasilar atelectasis and/or Infiltrate. Sigmoid diverticulosis. Sacral decubitus ulcer, as described above. MRI correlation is recommended, as sequelae associated with acute osteomyelitis cannot be excluded. Aortic atherosclerosis. 03/17: CT Head revealed mild chronic ischemic white matter disease. No acute intracranial abnormality seen 03/17: Vancomycin x1 dose and Cefepime x1 dose 03/17: Zosyn>>  Interim History / Subjective:  Pt lethargic no signs of respiratory distress mechanically intubated via tracheostomy   Objective   Blood pressure (!) 101/59, pulse (!) 128, temperature (!) 101.7 F (38.7 C), temperature source Rectal, resp. rate (!) 28, height '6\' 5"'  (1.956 m), weight 108.9 kg, SpO2 100 %.       No intake or output data in the 24 hours ending 12/07/20 1957 Filed Weights   12/07/20 1652  Weight: 108.9 kg   Examination: General: chronically appearing male, NAD receiving mechanical ventilation via trach  HENT: supple, no  JVD  Lungs: diminished throughout, even, non labored  Cardiovascular: sinus tach, no R/G, 2+ radial/1+ distal pulses, 2+ bilateral lower extremity edema  Abdomen: hypoactive BS x, soft, non distended  Extremities: quadriplegic Neuro: lethargic, PERRL  Skin: sacral spine decubitus  GU:  chronic foley in place   Labs/imaging personally reviewed   CT Abd/Pelvis CXR CT Head  UA  Resolved Hospital Problem list   N/A  Assessment & Plan:   Acute on chronic hypoxic respiratory failure secondary to mucous plugging which is chronic  Mechanical ventilation via chronic tracheostomy  Full vent support for now-vent settings reviewed and established SBT once all parameters met VAP bundle implemented  Aggressive pulmonary hygiene  May need bronchoscopy if mucous plugging persist   Hypotension secondary to urosepsis  Continuous telemetry monitoring  Aggressive fluid resuscitation and prn levophed/vasopressin gtts to maintain map >65  Mild acute renal failure secondary to ATN Severe lactic acidosis  Trend BMP and lactic acid   Replace electrolytes as indicated  Monitor UOP Avoid nephrotoxic medications   Urosepsis secondary to chronic foley  Leukopenia  Trend WBC and monitor fever curve  Trend PCT  Follow cultures  Continue zosyn  Foley catheter exchanged in the ED  Anemia without obvious acute blood loss Trend CBC  Monitor for s/sx of bleeding Transfuse for hgb <7  Sacral spine pressure ulcer-pt previously had wound vac in place with removal  Quadriplegia-bed bound Turn q2hrs  Wound Care consulted appreciate input   Acute toxic encephalopathy  Frequent reorientation  Will attempt to avoid sedating medications   Best practice (evaluated daily)  Diet:  NPO Pain/Anxiety/Delirium protocol (if indicated): Yes (RASS goal 0) VAP protocol (if indicated): Yes DVT prophylaxis: Systemic AC GI prophylaxis: H2B Glucose control:  SSI No Central venous access:  Yes, and it is still needed Arterial line:  N/A Foley:  Yes, and it is still needed Mobility:  bed rest  PT consulted: N/A Last date of multidisciplinary goals of care discussion [N/A] Code Status:  full code Disposition: ICU   Labs   CBC: Recent Labs  Lab 12/07/20 1725  WBC 2.0*  NEUTROABS 1.3*   HGB 11.1*  HCT 36.1*  MCV 95.3  PLT 756    Basic Metabolic Panel: Recent Labs  Lab 12/07/20 1725  NA 153*  K 3.5  CL 119*  CO2 26  GLUCOSE 147*  BUN 52*  CREATININE 1.02  CALCIUM 10.2   GFR: Estimated Creatinine Clearance: 95.1 mL/min (by C-G formula based on SCr of 1.02 mg/dL). Recent Labs  Lab 12/07/20 1725  WBC 2.0*  LATICACIDVEN 2.8*    Liver Function Tests: Recent Labs  Lab 12/07/20 1725  AST 32  ALT 26  ALKPHOS 179*  BILITOT 1.2  PROT 8.4*  ALBUMIN 3.1*   No results for input(s): LIPASE, AMYLASE in the last 168 hours. No results for input(s): AMMONIA in the last 168 hours.  ABG    Component Value Date/Time   HCO3 30.6 (H) 07/25/2020 1411   O2SAT 95.2 07/25/2020 1411     Coagulation Profile: No results for input(s): INR, PROTIME in the last 168 hours.  Cardiac Enzymes: No results for input(s): CKTOTAL, CKMB, CKMBINDEX, TROPONINI in the last 168 hours.  HbA1C: Hgb A1c MFr Bld  Date/Time Value Ref Range Status  06/09/2020 07:00 AM 5.8 (H) 4.8 - 5.6 % Final    Comment:    (NOTE) Pre diabetes:          5.7%-6.4%  Diabetes:              >  6.4%  Glycemic control for   <7.0% adults with diabetes     CBG: No results for input(s): GLUCAP in the last 168 hours.  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   Surgical History:   Past Surgical History:  Procedure Laterality Date  . APPLICATION OF WOUND VAC N/A 08/16/2020   Procedure: APPLICATION OF WOUND VAC;  Surgeon: Fredirick Maudlin, MD;  Location: [facility] ORS;   Service: General;  Laterality: N/A;  . COLONOSCOPY WITH PROPOFOL N/A 12/05/2017   Procedure: COLONOSCOPY WITH PROPOFOL;  Surgeon: Lin Landsman, MD;  Location: [facility] ENDOSCOPY;  Service: Gastroenterology;  Laterality: N/A;  . ESOPHAGOGASTRODUODENOSCOPY  12/05/2017   Procedure: ESOPHAGOGASTRODUODENOSCOPY (EGD);  Surgeon: Lin Landsman, MD;  Location: [place];  Service: Gastroenterology;;  . INCISION AND DRAINAGE ABSCESS N/A 08/16/2020   Procedure: INCISION AND DRAINAGE ABSCESS, SACRAL;  Surgeon: Fredirick Maudlin, MD;  Location: [facility] ORS;  Service: General;  Laterality: N/A;     Social History:   reports that he has never smoked. He has never used smokeless tobacco. He reports previous alcohol use. He reports that he does not use drugs.   Family History:  His family history includes Alcohol abuse in his brother; Cancer in his brother and father; Heart disease (age of onset: 72) in his brother; Liver cancer in his brother.   Allergies No Known Allergies   Home Medications  Prior to Admission medications   Medication Sig Start Date End Date Taking? Authorizing Provider  acetaminophen (TYLENOL) 160 MG/5ML solution Place 10-20 mLs (320-640 mg total) into feeding tube every 4 (four) hours as needed for mild pain. 07/18/20   Love, Ivan Anchors, PA-C  ascorbic acid (VITAMIN C) 500 MG tablet Place 1 tablet (500 mg total) into feeding tube 2 (two) times daily. 07/18/20   Love, Ivan Anchors, PA-C  baclofen (LIORESAL) 10 MG tablet Take 1 tablet (10 mg total) by mouth 3 (three) times daily. 11/12/20   Johnson, Megan P, DO  bisacodyl (DULCOLAX) 10 MG suppository Place 1 suppository (10 mg total) rectally at bedtime. 07/20/20   Love, Ivan Anchors, PA-C  chlorhexidine (PERIDEX) 0.12 % solution 15 mLs by Mouth Rinse route 2 (two) times daily. 07/20/20   Love, Ivan Anchors, PA-C  collagenase (SANTYL) ointment Apply topically daily. Patient not taking: Reported on 11/07/2020 07/20/20   Love, Ivan Anchors, PA-C   diclofenac Sodium (VOLTAREN) 1 % GEL Apply 4 g topically 4 (four) times daily. 08/31/20   Kathrine Haddock, NP  famotidine (PEPCID) 20 MG tablet Place 1 tablet (20 mg total) into feeding tube daily. 07/20/20   Love, Ivan Anchors, PA-C  ferrous sulfate 300 (60 Fe) MG/5ML syrup Place 1.3 mLs (78 mg total) into feeding tube daily. 07/20/20   Love, Ivan Anchors, PA-C  fludrocortisone (FLORINEF) 0.1 MG tablet Take 200 mcg by mouth daily. 11/02/20   [provider]  FLUoxetine (PROZAC) 20 MG/5ML solution Place 10 mLs (40 mg total) into feeding tube daily. 11/22/20   Vigg, Avanti, MD  guaiFENesin 200 MG tablet Place 2 tablets (400 mg total) into feeding tube every 6 (six) hours. 07/20/20   Bary Leriche, PA-C  lidocaine (LIDODERM) 5 % Place 1 patch onto the skin daily. Remove & Discard patch within 12 hours or as directed by MD 07/20/20   Love, Ivan Anchors, PA-C  midodrine (PROAMATINE) 10 MG tablet Place 1 tablet (10 mg total) into feeding tube 3 (three) times daily with meals. 11/04/20   Park Liter P, DO  Mouthwashes (MOUTH RINSE) LIQD solution 15 mLs by Mouth Rinse route 2 times daily at 12 noon and 4 pm. 07/20/20   Love, Ivan Anchors, PA-C  Multiple Vitamin (MULTIVITAMIN) LIQD Place 5 mLs into feeding tube daily. 07/20/20   Love, Ivan Anchors, PA-C  nutrition supplement, JUVEN, (JUVEN) PACK Place 1 packet into feeding tube 2 (two) times daily between meals. 07/20/20   Love, Ivan Anchors, PA-C  Nutritional Supplements (FEEDING SUPPLEMENT, OSMOLITE 1.5 CAL,) LIQD Place 474 mLs into feeding tube 4 (four) times daily. 07/20/20   Love, Ivan Anchors, PA-C  Nutritional Supplements (FEEDING SUPPLEMENT, PROSOURCE TF,) liquid Place 45 mLs into feeding tube 2 (two) times daily. 07/20/20   Love, Ivan Anchors, PA-C  ondansetron (ZOFRAN) 4 MG tablet Place 1 tablet (4 mg total) into feeding tube every 8 (eight) hours as needed for nausea, vomiting or refractory nausea / vomiting. 07/20/20   Love, Ivan Anchors, PA-C  polycarbophil (FIBERCON) 625  MG tablet Place 1 tablet (625 mg total) into feeding tube daily. 07/20/20   Love, Ivan Anchors, PA-C  rivaroxaban (XARELTO) 10 MG TABS tablet Take one tablet per tube daily 08/08/20   [name], MD  saccharomyces boulardii (FLORASTOR) 250 MG capsule Place 1 capsule (250 mg total) into feeding tube 2 (two) times daily. 07/20/20   Love, Ivan Anchors, PA-C  sennosides (SENOKOT) 8.8 MG/5ML syrup Place 5 mLs into feeding tube daily at 6 (six) AM. 07/20/20   Love, Ivan Anchors, PA-C  simethicone (MYLICON) 40 IT/2.5QD drops Place 1.2 mLs (80 mg total) into feeding tube 4 (four) times daily. 07/20/20   Love, Ivan Anchors, PA-C  traZODone (DESYREL) 50 MG tablet Place 1 tablet (50 mg total) into feeding tube at bedtime. 10/23/20   Jon Billings, NP  Water For Irrigation, Sterile (FREE WATER) SOLN Place 400 mLs into feeding tube every 4 (four) hours. Patient taking differently: Place 300 mLs into feeding tube every 4 (four) hours. 07/20/20   Love, Ivan Anchors, PA-C  zinc sulfate 220 (50 Zn) MG capsule Place 1 capsule (220 mg total) into feeding tube daily. 07/20/20   Bary Leriche, PA-C     Critical care time: 1 hour     Updated pts wife and daughter at bedside regarding pt condition and current plan of care.  All questions were answered.   Marda Stalker, [facility] Pager [phone] (please enter 7 digits) PCCM Consult Pager [phone] (please enter 7 digits)

## 2020-12-07 NOTE — ED Provider Notes (Signed)
[facility] Emergency Department Provider Note ____________________________________________   Event Date/Time   First MD Initiated Contact with Patient 12/07/20 1650     (approximate)  I have reviewed the triage vital signs and the nursing notes.  HISTORY  Chief Complaint Hematuria (Pt coming from home with complaints of recurrent UTI, +blood in urine. Foley cath in place. )   HPI Ryan Day is a 70 y.o. malewho presents to the ED for evaluation of hematuria.   Chart review indicates August 2021 fall causing cervical fracture and new quadriplegia. S/p Trach and PEG.  Last admitted 11/22-12/5 due to HCAP and a sacral ulcer causing osteomyelitis. Patient lives at home with his wife and has 2 adult daughters who are both nurses assisting with his care at home.  His Foley catheter was placed 2 weeks ago.  His last course of antibiotics finished about 2 weeks ago, he got 7 days for a UTI at that time.  Takes no oral intake, everything through the PEG tube.  Patient presents to the ED via EMS for evaluation of hematuria, generalized weakness and tachycardia at home.  Patient does not provide much history, indicated that he feels fine and that his wife had concerns about his care.   Patient's wife arrives at the bedside and provides the majority of history.  She indicates noticing hematuria yesterday and today.  She reports he has seemed more weak, increased spasms versus chills, and "not quite himself" over the past 1 week or so.  She reports noticing sinus tachycardia up to the 120s at home over the past 3 days.  Denies documented fevers.   Past Medical History:  Diagnosis Date  . Acute on chronic respiratory failure with hypoxia ([place])   . Acute renal injury due to hypovolemia ([place])   . Autonomic instability   . Cardiac arrest ([place])   . Cervical spinal cord injury, sequela ([place])   . Elevated alkaline phosphatase level   . 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 Hyperlipidemia   . Hypertension     Patient Active Problem List   Diagnosis Date Noted  . Sepsis ([place]) 12/07/2020  . Decubitus ulcer of sacral region, stage 4 ([place])   . Hypokalemia 08/15/2020  . Hypoalbuminemia 08/15/2020  . Leukocytosis 08/15/2020  . Pneumonia 08/14/2020  . Goals of care, counseling/discussion   . Sacral decubitus ulcer, stage II ([place]) 07/25/2020  . Aspiration pneumonia ([place]) 07/25/2020  . Palliative care by specialist   . Salivary secretion   . Spasticity   . Spinal cord injury, cervical region, sequela ([place]) 06/23/2020  . Pressure injury of skin 06/23/2020  . S/P percutaneous endoscopic gastrostomy (PEG) tube placement ([place])   . Tracheostomy in place [facility])   . Quadriplegia ([place])   . Neurogenic orthostatic hypotension ([place])   . Acute on chronic anemia   . Hypernatremia   . Neurogenic bladder   . Neurogenic bowel   . Acute on chronic respiratory failure with hypoxia ([place])   . Autonomic instability   . Cardiac arrest ([place])   . Cervical spinal cord injury, sequela ([place])   . Acute renal injury due to hypovolemia ([place])   . Family history of stomach cancer   . Exercise-induced leg cramps 05/13/2017  . Elevated alkaline phosphatase level 03/13/2015  . Hyperlipidemia   . Hypertension     Past Surgical History:  Procedure Laterality Date  . APPLICATION OF WOUND VAC N/A 08/16/2020   Procedure: APPLICATION OF WOUND VAC;  Surgeon: Fredirick Maudlin, MD;  Location: [facility] ORS;  Service: General;  Laterality: N/A;  . COLONOSCOPY WITH PROPOFOL N/A 12/05/2017   Procedure: COLONOSCOPY WITH PROPOFOL;  Surgeon: Lin Landsman, MD;  Location: [facility] ENDOSCOPY;  Service: Gastroenterology;  Laterality: N/A;  . ESOPHAGOGASTRODUODENOSCOPY  12/05/2017   Procedure: ESOPHAGOGASTRODUODENOSCOPY (EGD);  Surgeon: Lin Landsman, MD;  Location: [place];  Service: Gastroenterology;;  . INCISION AND DRAINAGE ABSCESS N/A 08/16/2020   Procedure: INCISION AND  DRAINAGE ABSCESS, SACRAL;  Surgeon: Fredirick Maudlin, MD;  Location: [facility] ORS;  Service: General;  Laterality: N/A;    Prior to Admission medications   Medication Sig Start Date End Date Taking? Authorizing Provider  acetaminophen (TYLENOL) 160 MG/5ML solution Place 10-20 mLs (320-640 mg total) into feeding tube every 4 (four) hours as needed for mild pain. 07/18/20   Love, Ivan Anchors, PA-C  ascorbic acid (VITAMIN C) 500 MG tablet Place 1 tablet (500 mg total) into feeding tube 2 (two) times daily. 07/18/20   Love, Ivan Anchors, PA-C  baclofen (LIORESAL) 10 MG tablet Take 1 tablet (10 mg total) by mouth 3 (three) times daily. 11/12/20   Johnson, Megan P, DO  bisacodyl (DULCOLAX) 10 MG suppository Place 1 suppository (10 mg total) rectally at bedtime. 07/20/20   Love, Ivan Anchors, PA-C  chlorhexidine (PERIDEX) 0.12 % solution 15 mLs by Mouth Rinse route 2 (two) times daily. 07/20/20   Love, Ivan Anchors, PA-C  collagenase (SANTYL) ointment Apply topically daily. Patient not taking: Reported on 11/07/2020 07/20/20   Love, Ivan Anchors, PA-C  diclofenac Sodium (VOLTAREN) 1 % GEL Apply 4 g topically 4 (four) times daily. 08/31/20   Kathrine Haddock, NP  famotidine (PEPCID) 20 MG tablet Place 1 tablet (20 mg total) into feeding tube daily. 07/20/20   Love, Ivan Anchors, PA-C  ferrous sulfate 300 (60 Fe) MG/5ML syrup Place 1.3 mLs (78 mg total) into feeding tube daily. 07/20/20   Love, Ivan Anchors, PA-C  fludrocortisone (FLORINEF) 0.1 MG tablet Take 200 mcg by mouth daily. 11/02/20   [provider]  FLUoxetine (PROZAC) 20 MG/5ML solution Place 10 mLs (40 mg total) into feeding tube daily. 11/22/20   Vigg, Avanti, MD  guaiFENesin 200 MG tablet Place 2 tablets (400 mg total) into feeding tube every 6 (six) hours. 07/20/20   Love, Ivan Anchors, PA-C  lidocaine (LIDODERM) 5 % Place 1 patch onto the skin daily. Remove & Discard patch within 12 hours or as directed by MD 07/20/20   Love, Ivan Anchors, PA-C  midodrine (PROAMATINE) 10 MG  tablet Place 1 tablet (10 mg total) into feeding tube 3 (three) times daily with meals. 11/04/20   Park Liter P, DO  Mouthwashes (MOUTH RINSE) LIQD solution 15 mLs by Mouth Rinse route 2 times daily at 12 noon and 4 pm. 07/20/20   Love, Ivan Anchors, PA-C  Multiple Vitamin (MULTIVITAMIN) LIQD Place 5 mLs into feeding tube daily. 07/20/20   Love, Ivan Anchors, PA-C  nutrition supplement, JUVEN, (JUVEN) PACK Place 1 packet into feeding tube 2 (two) times daily between meals. 07/20/20   Love, Ivan Anchors, PA-C  Nutritional Supplements (FEEDING SUPPLEMENT, OSMOLITE 1.5 CAL,) LIQD Place 474 mLs into feeding tube 4 (four) times daily. 07/20/20   Love, Ivan Anchors, PA-C  Nutritional Supplements (FEEDING SUPPLEMENT, PROSOURCE TF,) liquid Place 45 mLs into feeding tube 2 (two) times daily. 07/20/20   Love, Ivan Anchors, PA-C  ondansetron (ZOFRAN) 4 MG tablet Place 1 tablet (4 mg total) into feeding tube every 8 (eight)  hours as needed for nausea, vomiting or refractory nausea / vomiting. 07/20/20   Love, Ivan Anchors, PA-C  polycarbophil (FIBERCON) 625 MG tablet Place 1 tablet (625 mg total) into feeding tube daily. 07/20/20   Love, Ivan Anchors, PA-C  rivaroxaban (XARELTO) 10 MG TABS tablet Take one tablet per tube daily 08/08/20   [name], MD  saccharomyces boulardii (FLORASTOR) 250 MG capsule Place 1 capsule (250 mg total) into feeding tube 2 (two) times daily. 07/20/20   Love, Ivan Anchors, PA-C  sennosides (SENOKOT) 8.8 MG/5ML syrup Place 5 mLs into feeding tube daily at 6 (six) AM. 07/20/20   Love, Ivan Anchors, PA-C  simethicone (MYLICON) 40 UY/4.0HK drops Place 1.2 mLs (80 mg total) into feeding tube 4 (four) times daily. 07/20/20   Love, Ivan Anchors, PA-C  traZODone (DESYREL) 50 MG tablet Place 1 tablet (50 mg total) into feeding tube at bedtime. 10/23/20   Jon Billings, NP  Water For Irrigation, Sterile (FREE WATER) SOLN Place 400 mLs into feeding tube every 4 (four) hours. Patient taking differently: Place 300 mLs into  feeding tube every 4 (four) hours. 07/20/20   Love, Ivan Anchors, PA-C  zinc sulfate 220 (50 Zn) MG capsule Place 1 capsule (220 mg total) into feeding tube daily. 07/20/20   Bary Leriche, PA-C    Allergies Patient has no known allergies.  Family History  Problem Relation Age of Onset  . Cancer Father        lung  . Cancer Brother        throat  . Heart disease Brother 80  . Liver cancer Brother   . Alcohol abuse Brother     Social History Social History   Tobacco Use  . Smoking status: Never Smoker  . Smokeless tobacco: Never Used  Vaping Use  . Vaping Use: Never used  Substance Use Topics  . Alcohol use: Not Currently  . Drug use: No    Review of Systems  Constitutional: No fever.  Positive for increased generalized weakness and increased frequency of chills versus spasms. Eyes: No visual changes. ENT: No sore throat. Cardiovascular: Denies chest pain. Respiratory: Denies shortness of breath. Gastrointestinal: No abdominal pain.  No nausea, no vomiting.  No diarrhea.  No constipation. Genitourinary: Negative for dysuria.  Positive for hematuria. Musculoskeletal: Negative for back pain. Skin: Negative for rash. Neurological: Negative for headaches, focal weakness or numbness.  ____________________________________________   PHYSICAL EXAM:  VITAL SIGNS: Vitals:   12/07/20 2011 12/07/20 2012  BP:  102/62  Pulse:  (!) 128  Resp:  (!) 28  Temp: (!) 97.1 F (36.2 C)   SpO2:  99%      Constitutional: Alert and oriented. Well appearing and in no acute distress.  Supine quadriplegic with intact cranial nerves who is conversational. Eyes: Conjunctivae are normal. PERRL. EOMI. Head: Atraumatic. Nose: No congestion/rhinnorhea. Mouth/Throat: Mucous membranes are dry.  Oropharynx non-erythematous. Neck: No stridor. No cervical spine tenderness to palpation.  Trach in place Cardiovascular: Tachycardic rate, regular rhythm. Grossly normal heart sounds.  Good  peripheral circulation. Respiratory: Minimal tachypnea to the low 20s, no further evidence of distress.  Clear lungs without wheezing. Gastrointestinal: Soft , nondistended, nontender to palpation. No CVA tenderness. Indwelling Foley catheter in place with sediment noted in the tubing.  Hematuria noted in the bag. Musculoskeletal:  No joint effusions. No signs of acute trauma. Neurologic:  Normal speech and language.  Cranial nerves intact.  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 Occasional diffuse  muscular spasms Skin:  Skin is warm, dry . Marland Kitchen Psychiatric: Mood and affect are normal. Speech and behavior are normal.  ____________________________________________   LABS (all labs ordered are listed, but only abnormal results are displayed)  Labs Reviewed  CBC WITH DIFFERENTIAL/PLATELET - Abnormal; Notable for the following components:      Result Value   WBC 2.0 (*)    RBC 3.79 (*)    Hemoglobin 11.1 (*)    HCT 36.1 (*)    RDW 16.5 (*)    nRBC 2.5 (*)    Neutro Abs 1.3 (*)    Lymphs Abs 0.5 (*)    Monocytes Absolute 0.0 (*)    All other components within normal limits  COMPREHENSIVE METABOLIC PANEL - Abnormal; Notable for the following components:   Sodium 153 (*)    Chloride 119 (*)    Glucose, Bld 147 (*)    BUN 52 (*)    Total Protein 8.4 (*)    Albumin 3.1 (*)    Alkaline Phosphatase 179 (*)    All other components within normal limits  LACTIC ACID, PLASMA - Abnormal; Notable for the following components:   Lactic Acid, Venous 2.8 (*)    All other components within normal limits  URINALYSIS, COMPLETE (UACMP) WITH MICROSCOPIC - Abnormal; Notable for the following components:   Color, Urine YELLOW (*)    APPearance TURBID (*)    Hgb urine dipstick MODERATE (*)    Protein, ur 100 (*)    Leukocytes,Ua LARGE (*)    WBC, UA >50 (*)    Bacteria, UA MANY (*)    All other components within normal limits  CBC - Abnormal; Notable for the following components:   RBC  3.39 (*)    Hemoglobin 10.0 (*)    HCT 33.4 (*)    MCHC 29.9 (*)    RDW 16.8 (*)    All other components within normal limits  CREATININE, SERUM - Abnormal; Notable for the following components:   Creatinine, Ser 1.29 (*)    All other components within normal limits  LACTIC ACID, PLASMA - Abnormal; Notable for the following components:   Lactic Acid, Venous 4.1 (*)    All other components within normal limits  URINE CULTURE  CULTURE, BLOOD (ROUTINE X 2)  CULTURE, BLOOD (ROUTINE X 2)  RESP PANEL BY RT-PCR (FLU A&B, COVID) ARPGX2  CBC  BASIC METABOLIC PANEL  MAGNESIUM  PHOSPHORUS  LACTIC ACID, PLASMA   ____________________________________________  12 Lead EKG  Sinus rhythm, rate of 116 bpm.  Normal axis and normal intervals.  No acute ischemia.  Sinus tachycardia. ____________________________________________  RADIOLOGY  ED MD interpretation: 1 view CXR reviewed by me without evidence of acute cardiopulmonary pathology.  Official radiology report(s): DG Chest Portable 1 View  Result Date: 12/07/2020 CLINICAL DATA:  Weakness and tachycardia. EXAM: PORTABLE CHEST 1 VIEW COMPARISON:  Chest x-ray dated August 18, 2020. FINDINGS: Unchanged tracheostomy tube. Stable cardiomediastinal silhouette. Normal pulmonary vascularity. No focal consolidation, pleural effusion, or pneumothorax. No acute osseous abnormality. IMPRESSION: No active disease. Electronically Signed   By: Titus Dubin M.D.   On: 12/07/2020 17:30    ____________________________________________   PROCEDURES and INTERVENTIONS  Procedure(s) performed (including Critical Care):  .1-3 Lead EKG Interpretation Performed by: Vladimir Crofts, MD Authorized by: Vladimir Crofts, MD     Interpretation: abnormal     ECG rate:  110   ECG rate assessment: tachycardic     Rhythm: sinus tachycardia     Ectopy:  none     Conduction: normal    .Critical Care Performed by: Vladimir Crofts, MD Authorized by: Vladimir Crofts, MD    Critical care provider statement:    Critical care time (minutes):  75   Critical care was time spent personally by me on the following activities:  Discussions with consultants, evaluation of patient's response to treatment, examination of patient, ordering and performing treatments and interventions, ordering and review of laboratory studies, ordering and review of radiographic studies, pulse oximetry, re-evaluation of patient's condition, obtaining history from patient or surrogate and review of old charts .Central Line  Date/Time: 12/07/2020 8:26 PM Performed by: Vladimir Crofts, MD Authorized by: Vladimir Crofts, MD   Consent:    Consent obtained:  Verbal   Consent given by:  Spouse   Risks, benefits, and alternatives were discussed: yes     Risks discussed:  Arterial puncture, infection, bleeding and incorrect placement   Alternatives discussed:  No treatment Pre-procedure details:    Indication(s): central venous access and insufficient peripheral access     Hand hygiene: Hand hygiene performed prior to insertion     Sterile barrier technique: All elements of maximal sterile technique followed     Skin preparation:  Chlorhexidine   Skin preparation agent: Skin preparation agent completely dried prior to procedure   Procedure details:    Location:  R femoral   Patient position:  Supine   Procedural supplies:  Triple lumen   Catheter size:  9.5 Fr   Landmarks identified: yes     Ultrasound guidance: yes     Ultrasound guidance timing: prior to insertion and real time     Sterile ultrasound techniques: Sterile gel and sterile probe covers were used     Number of attempts:  1   Successful placement: yes   Post-procedure details:    Post-procedure:  Dressing applied   Assessment:  Blood return through all ports and free fluid flow   Procedure completion:  Tolerated well, no immediate complications    Medications  lactated ringers infusion (has no administration in time range)   ceFEPIme (MAXIPIME) 2 g in sodium chloride 0.9 % 100 mL IVPB (2 g Intravenous New Bag/Given 12/07/20 2006)  norepinephrine (LEVOPHED) 4mg  in 229mL premix infusion (15 mcg/min Intravenous Rate/Dose Change 12/07/20 2011)  docusate sodium (COLACE) capsule 100 mg (has no administration in time range)  polyethylene glycol (MIRALAX / GLYCOLAX) packet 17 g (has no administration in time range)  enoxaparin (LOVENOX) injection 40 mg (has no administration in time range)  ondansetron (ZOFRAN) injection 4 mg (has no administration in time range)  vasopressin (PITRESSIN) 20 Units in sodium chloride 0.9 % 100 mL infusion-*FOR SHOCK* (0.03 Units/min Intravenous New Bag/Given 12/07/20 1952)  lactated ringers bolus 1,000 mL (0 mLs Intravenous Stopped 12/07/20 1949)  lactated ringers bolus 1,000 mL (0 mLs Intravenous Stopped 12/07/20 1941)  vancomycin (VANCOCIN) IVPB 1000 mg/200 mL premix (0 mg Intravenous Stopped 12/07/20 1942)   ____________________________________________  MDM / ED COURSE   70 year old quadriplegic who is trach dependent presents from home with generalized weakness and hematuria, with evidence of uroseptic shock requiring ICU admission.  Presents hemodynamically stable with mild sinus tachycardia on room air.  Blood work with evidence of sepsis with leukopenia and lactic acidosis, but intact renal function.  His Foley catheter was replaced here in the ED and urinalysis sent from this fresh tube, returns with evidence of infection and likely source of his symptoms.  Sepsis protocols were followed and broad-spectrum antibiotics were  provided after cultures were drawn.  Difficulty acquiring IV access and around this time he becomes hypotensive and more tachycardic while not having any IV access, so elected to place a right femoral central line which was without complication.  Subsequently desaturating quite suddenly into the 70s on room air, necessitating BVM.  I suspect mucous plugging considering his  clear CXR without pneumonia.  He is requiring 10-20 mcg/min of Levophed for normotension despite 2 L fluid bolus.  Bedside suctioning through his trach does yield a fair amount of secretions, but he requires continued BVM.  RT placed the patient on a vent and I discussed the case with ICU, who agrees to admit the patient.  CT head and abdomen/pelvis are pending at the time of admission, and ICU agrees to follow-up on the studies.  Clinical Course as of 12/07/20 2025  Thu Dec 07, 2020  1709 Wife arrives at the bedside and provides additional history. [DS]  6761 Nurse informs me of desaturation episode while they are replacing his Foley catheter.  He quickly desatted into the 70s and required nonrebreather to regain normoxia.  Became more tachycardic during this episode.  Respiratory was called and they are in the room when I go to reevaluate the patient.  I help nurses turn the patient and visualize his decubitus ulcers, which look okay.  Rectal temp of 101.2 F.  Purulent/cloudy urine coming from the new Foley. [DS]  9509 Nurse also informs me that patient does not have an IV and is a difficult stick.  I asked her to get ultrasound machine into the room so I can place an IV and help draw blood [DS]  1831 Peripheral IV attempt failed by me to right-sided upper arm, unable to even visualize cephalic or basilic veins due to his edema.  He remains tachycardic in the 130s and a sinus tach with hypotensive pressures with maps in the 40s and we have no IV access.  I therefore placed a right-sided femoral central line, as above.  2 L of fluids and cefepime being started now.  Advised nurse of starting Levophed as well. [DS]  1914 I speak with Hinton Dyer, ICU NP, she indicates that she will place admission orders for the patient.  They have beds at this time.  We discussed urosepsis, septic shock and likely mucous plugging with his intermittent desaturations. [DS]  1922 Reassessed.  Patient still requiring BVM via  trach to maintain saturations.  Continues to have decreased breath sounds in the right concerning for mucous plug in the setting of his clear chest x-ray earlier. [DS]    Clinical Course User Index [DS] Vladimir Crofts, MD    ____________________________________________   FINAL CLINICAL IMPRESSION(S) / ED DIAGNOSES  Final diagnoses:  None     ED Discharge Orders    None       Mehkai Gallo   Note:  This document was prepared using Dragon voice recognition software and may include unintentional dictation errors.   Vladimir Crofts, MD 12/07/20 2030

## 2020-12-07 NOTE — ED Notes (Signed)
This Therapist, sports. Maribel RN, and respiratory transporting pt to CT then ICU at this time.

## 2020-12-07 NOTE — Sepsis Progress Note (Signed)
Following for sepsis monitoring. Antibiotics have been given and blood cultures drawn. Received appropriate fluid resuscitation. repeat lactic acids have trended up. Now intubated, on pressors and in the ICU. Continue to treat for sepsis and follow as ICU patient. Will close sepsis monitoring at this time.

## 2020-12-07 NOTE — ED Notes (Signed)
Hinton Dyer NP advising leave levo at 50 mcg/min until pt gets to ICU unless about to add vasopressin prior.

## 2020-12-07 NOTE — ED Notes (Signed)
Hinton Dyer NP at bedside.

## 2020-12-07 NOTE — Sepsis Progress Note (Signed)
ELink tracking the code Sepsis. ?

## 2020-12-07 NOTE — Progress Notes (Signed)
CODE SEPSIS - PHARMACY COMMUNICATION  **Broad Spectrum Antibiotics should be administered within 1 hour of Sepsis diagnosis**  Time Code Sepsis Called/Page Received: 1758  Antibiotics Ordered: Cefepime / vancomycin  Time of 1st antibiotic administration: 1842 (vancomycin)  Additional action taken by pharmacy: Discussed with RN at [place]. Cefepime was out of stock in [place]. Will expedite dose from pharmacy. RN aware that cefepime should be given first but was unavailable.   Benita Gutter 12/07/2020  6:12 PM

## 2020-12-07 NOTE — Sepsis Progress Note (Signed)
Notified provider of need to order additional  fluid bolus. 

## 2020-12-07 NOTE — Telephone Encounter (Signed)
Routing to provider to advise on results once they return tomorrow.

## 2020-12-07 NOTE — ED Notes (Signed)
MD Smith at bedside.

## 2020-12-07 NOTE — Consult Note (Signed)
[place] for Electrolyte Monitoring and Replacement   Recent Labs: Potassium (mmol/L)  Date Value  12/07/2020 3.5   Magnesium (mg/dL)  Date Value  08/25/2020 2.2   Calcium (mg/dL)  Date Value  12/07/2020 10.2   Albumin (g/dL)  Date Value  12/07/2020 3.1 (L)  02/01/2020 4.4   Phosphorus (mg/dL)  Date Value  08/24/2020 2.5   Sodium (mmol/L)  Date Value  12/07/2020 153 (H)  02/01/2020 143   Corrected calcium: 10.9 mg/dL  Assessment: Patient is a 70 y/o M with medical history including paraplegia with chronic tracheostomy and PEG tube who was admitted 3/17 with sepsis secondary to suspected urinary source. Pharmacy has been consulted to assist with electrolyte monitoring and replacement as indicated.  MIVF: LR at 150 mL/hr  Goal of Therapy:  Electrolytes within normal limits  Plan:  3/17 admission labs: Na 153, K 3.5, Scr 1.02 (BL 0.5 - 0.6) --No electrolyte replacement warranted at this time --Follow-up electrolytes with AM labs  Benita Gutter 12/07/2020 11:17 PM

## 2020-12-07 NOTE — Progress Notes (Addendum)
Ryan Day ([id number]) Visit Report for 12/06/2020 Arrival Information Details Patient Name: Ryan Day Date of Service: 12/06/2020 10:45 AM Medical Record Number: [id number] Patient Account Number: [id number] Date of Birth/Sex: [birth year] (70 y.o. M) Treating RN: Ryan Day Primary Care Dywane Peruski: Charlynne Cousins Other Clinician: Referring Derald Lorge: Charlynne Cousins Treating Ryan Day in Treatment: 9 Visit Information History Since Last Visit Pain Present Now: No Patient Arrived: Stretcher Arrival Time: 11:12 Accompanied By: wife Transfer Assistance: Stretcher Patient Identification Verified: Yes Secondary Verification Process Completed: Yes Patient Requires Transmission-Based Precautions: No Patient Has Alerts: Yes Patient Alerts: xalerto NOT DIABETIC Electronic Signature(s) Signed: 12/06/2020 5:04:06 PM By: Ryan Mouse, Minus Breeding RN Entered By: Ryan Day on 12/06/2020 11:12:57 Ryan Day ([id number]) -------------------------------------------------------------------------------- Clinic Level of Care Assessment Details Patient Name: Ryan Day Date of Service: 12/06/2020 10:45 AM Medical Record Number: [id number] Patient Account Number: [id number] Date of Birth/Sex: 02-Apr-1951 (69 y.o. M) Treating RN: Ryan Day Primary Care Jaccob Czaplicki: Charlynne Cousins Other Clinician: Referring Abdoul Encinas: Charlynne Cousins Treating Sincere Berlanga/Extender: Ryan Day in Treatment: 9 Clinic Level of Care Assessment Items TOOL 4 Quantity Score []  - Use when only an EandM is performed on FOLLOW-UP visit 0 ASSESSMENTS - Nursing Assessment / Reassessment X - Reassessment of Co-morbidities (includes updates in patient status) 1 10 X- 1 5 Reassessment of Adherence to Treatment Plan ASSESSMENTS - Wound and Skin Assessment / Reassessment []  - Simple Wound Assessment / Reassessment - one wound 0 X- 4 5 Complex Wound Assessment / Reassessment - multiple  wounds []  - 0 Dermatologic / Skin Assessment (not related to wound area) ASSESSMENTS - Focused Assessment []  - Circumferential Edema Measurements - multi extremities 0 []  - 0 Nutritional Assessment / Counseling / Intervention []  - 0 Lower Extremity Assessment (monofilament, tuning fork, pulses) []  - 0 Peripheral Arterial Disease Assessment (using hand held doppler) ASSESSMENTS - Ostomy and/or Continence Assessment and Care []  - Incontinence Assessment and Management 0 []  - 0 Ostomy Care Assessment and Management (repouching, etc.) PROCESS - Coordination of Care X - Simple Patient / Family Education for ongoing care 1 15 []  - 0 Complex (extensive) Patient / Family Education for ongoing care X- 1 10 Staff obtains Programmer, systems, Records, Test Results / Process Orders []  - 0 Staff telephones HHA, Nursing Homes / Clarify orders / etc []  - 0 Routine Transfer to another Facility (non-emergent condition) []  - 0 Routine Hospital Admission (non-emergent condition) []  - 0 New Admissions / Biomedical engineer / Ordering NPWT, Apligraf, etc. []  - 0 Emergency Hospital Admission (emergent condition) X- 1 10 Simple Discharge Coordination []  - 0 Complex (extensive) Discharge Coordination PROCESS - Special Needs []  - Pediatric / Minor Patient Management 0 []  - 0 Isolation Patient Management []  - 0 Hearing / Language / Visual special needs []  - 0 Assessment of Community assistance (transportation, D/C planning, etc.) []  - 0 Additional assistance / Altered mentation []  - 0 Support Surface(s) Assessment (bed, cushion, seat, etc.) INTERVENTIONS - Wound Cleansing / Measurement Ryan Day ([id number]) []  - 0 Simple Wound Cleansing - one wound X- 4 5 Complex Wound Cleansing - multiple wounds X- 1 5 Wound Imaging (photographs - any number of wounds) []  - 0 Wound Tracing (instead of photographs) []  - 0 Simple Wound Measurement - one wound X- 4 5 Complex Wound Measurement - multiple  wounds INTERVENTIONS - Wound Dressings []  - Small Wound Dressing one or multiple wounds 0 X- 4 15 Medium Wound Dressing one or multiple wounds []  - 0 Large Wound  Dressing one or multiple wounds []  - 0 Application of Medications - topical []  - 0 Application of Medications - injection INTERVENTIONS - Miscellaneous []  - External ear exam 0 []  - 0 Specimen Collection (cultures, biopsies, blood, body fluids, etc.) []  - 0 Specimen(s) / Culture(s) sent or taken to Lab for analysis []  - 0 Patient Transfer (multiple staff / Civil Service fast streamer / Similar devices) []  - 0 Simple Staple / Suture removal (25 or less) []  - 0 Complex Staple / Suture removal (26 or more) []  - 0 Hypo / Hyperglycemic Management (close monitor of Blood Glucose) []  - 0 Ankle / Brachial Index (ABI) - do not check if billed separately X- 1 5 Vital Signs Has the patient been seen at the hospital within the last three years: Yes Total Score: 180 Level Of Care: New/Established - Level 5 Electronic Signature(s) Signed: 12/06/2020 6:32:03 PM By: Ryan Day Entered By: Ryan Cool, BSN, RN, CWS, Kim on 12/06/2020 18:30:10 Ryan Day ([id number]) -------------------------------------------------------------------------------- Encounter Discharge Information Details Patient Name: Ryan Day Date of Service: 12/06/2020 10:45 AM Medical Record Number: [id number] Patient Account Number: [id number] Date of Birth/Sex: 20-Dec-1950 (69 y.o. M) Treating RN: Ryan Day Primary Care Mignon Bechler: Charlynne Cousins Other Clinician: Referring Tiasha Helvie: Charlynne Cousins Treating Camyah Pultz/Extender: Ryan Day in Treatment: 9 Encounter Discharge Information Items Discharge Condition: Stable Ambulatory Status: Wheelchair Discharge Destination: Home Transportation: Ambulance Accompanied By: caregiver Schedule Follow-up Appointment: Yes Clinical Summary of Care: Patient Declined Electronic Signature(s) Signed:  12/07/2020 3:44:01 PM By: Ryan Coria RN Entered By: Ryan Day on 12/06/2020 12:06:35 Ryan Day ([id number]) -------------------------------------------------------------------------------- Lower Extremity Assessment Details Patient Name: Ryan Day Date of Service: 12/06/2020 10:45 AM Medical Record Number: [id number] Patient Account Number: [id number] Date of Birth/Sex: 1951-05-16 (69 y.o. M) Treating RN: Ryan Day Primary Care Sharlon Pfohl: Charlynne Cousins Other Clinician: Referring Zaray Gatchel: Charlynne Cousins Treating Taraneh Metheney/Extender: Ricard Dillon Weeks in Treatment: 9 Edema Assessment Assessed: [Left: Yes] [Right: Yes] Edema: [Left: No] [Right: No] Vascular Assessment Pulses: Dorsalis Pedis Palpable: [Left:Yes] [Right:Yes] Electronic Signature(s) Signed: 12/06/2020 5:04:06 PM By: Ryan Mouse, Minus Breeding RN Entered By: Ryan Day on 12/06/2020 11:33:19 [place], [place] ([id number]) -------------------------------------------------------------------------------- Multi Wound Chart Details Patient Name: Ryan Day Date of Service: 12/06/2020 10:45 AM Medical Record Number: [id number] Patient Account Number: [id number] Date of Birth/Sex: [birth year] (69 y.o. M) Treating RN: Ryan Day Primary Care Lenvil Swaim: Charlynne Cousins Other Clinician: Referring Blessed Cotham: Charlynne Cousins Treating Jamisha Hoeschen/Extender: Ryan Day in Treatment: 9 Vital Signs Height(in): 77 Pulse(bpm): 81 Weight(lbs): 240 Blood Pressure(mmHg): 108/72 Body Mass Index(BMI): 28 Temperature(F): 97.6 Respiratory Rate(breaths/min): 18 Photos: Wound Location: Right Gluteus Sacrum Right Calcaneus Wounding Event: Pressure Injury Pressure Injury Pressure Injury Primary Etiology: Pressure Ulcer Pressure Ulcer Pressure Ulcer Comorbid History: Hypotension, Quadriplegia Hypotension, Quadriplegia Hypotension, Quadriplegia Date Acquired: 06/23/2020 06/23/2020 08/18/2020 Weeks of Treatment: 9 9 9  Wound  Status: Open Open Open Measurements L x W x D (cm) 1.1x1x0.2 2.2x2x1.4 2.7x2.4x0.1 Area (cm) : 0.864 3.456 5.089 Volume (cm) : 0.173 4.838 0.509 % Reduction in Area: 37.50% 14.10% -980.50% % Reduction in Volume: -25.40% 65.60% -983.00% Starting Position 1 (o'clock): 12 Ending Position 1 (o'clock): 2 Maximum Distance 1 (cm): 2.4 Undermining: No Yes No Classification: Category/Stage II Category/Stage IV Category/Stage II Exudate Amount: Medium Medium None Present Exudate Type: Serosanguineous Serosanguineous N/A Exudate Color: red, brown red, brown N/A Wound Margin: N/A Flat and Intact N/A Granulation Amount: Large (67-100%) Medium (34-66%) None Present (0%) Granulation Quality: Pink Red, Pink N/A Necrotic Amount: Small (1-33%)  Medium (34-66%) Large (67-100%) Necrotic Tissue: Adherent [place] Exposed Structures: Fat Layer (Subcutaneous Tissue): Fat Layer (Subcutaneous Tissue): Fat Layer (Subcutaneous Tissue): Yes Yes Yes Fascia: No Fascia: No Fascia: No Tendon: No Tendon: No Tendon: No Muscle: No Muscle: No Muscle: No Joint: No Joint: No Joint: No Bone: No Bone: No Bone: No Epithelialization: None None None Wound Number: 4 N/A N/A Photos: N/A N/A [place], Guillermo ([id number]) Wound Location: Left Calcaneus N/A N/A Wounding Event: Pressure Injury N/A N/A Primary Etiology: Pressure Ulcer N/A N/A Comorbid History: Hypotension, Quadriplegia N/A N/A Date Acquired: 08/18/2020 N/A N/A Weeks of Treatment: 9 N/A N/A Wound Status: Open N/A N/A Measurements L x W x D (cm) 4.5x5.6x0.1 N/A N/A Area (cm) : 19.792 N/A N/A Volume (cm) : 1.979 N/A N/A % Reduction in Area: -26.00% N/A N/A % Reduction in Volume: -26.00% N/A N/A Undermining: No N/A N/A Classification: Category/Stage II N/A N/A Exudate Amount: None Present N/A N/A Exudate Type: N/A N/A N/A Exudate Color: N/A N/A N/A Wound Margin: Flat and Intact N/A N/A Granulation Amount: None Present (0%)  N/A N/A Granulation Quality: N/A N/A N/A Necrotic Amount: Large (67-100%) N/A N/A Necrotic Tissue: Eschar N/A N/A Exposed Structures: Fat Layer (Subcutaneous Tissue): N/A N/A Yes Fascia: No Tendon: No Muscle: No Joint: No Bone: No Epithelialization: None N/A N/A Treatment Notes Wound #1 (Gluteus) Wound Laterality: Right Cleanser Peri-Wound Care Topical Primary Dressing AquacelAg Advantage Dressing, 4x5 (in/in) Discharge Instruction: Apply to wound as directed Secondary Dressing ABD Pad 5x9 (in/in) Discharge Instruction: Cover with ABD pad Secured With 87M Medipore H Soft Cloth Surgical Tape, 2x2 (in/yd) Compression Wrap Compression Stockings Add-Ons Wound #2 (Sacrum) Cleanser Peri-Wound Care Topical Primary Dressing Gauze Sabo, Lerone ([id number]) Discharge Instruction: packed lightly, moistened with hydrogel Secondary Dressing ABD Pad 5x9 (in/in) Discharge Instruction: Cover with ABD pad Secured With 87M Medipore H Soft Cloth Surgical Tape, 2x2 (in/yd) Compression Wrap Compression Stockings Add-Ons Wound #3 (Calcaneus) Wound Laterality: Right Cleanser Peri-Wound Care Topical Primary Dressing AquacelAg Advantage Dressing, 2X2 (in/in) Discharge Instruction: Apply to wound as directed Secondary Dressing ABD Pad 5x9 (in/in) Discharge Instruction: Cover with ABD pad Secured With Kerlix Roll Sterile or Non-Sterile 6-ply 4.5x4 (yd/yd) Discharge Instruction: Apply Kerlix as directed Compression Wrap Compression Stockings Add-Ons Wound #4 (Calcaneus) Wound Laterality: Left Cleanser Peri-Wound Care Topical Primary Dressing AquacelAg Advantage Dressing, 2X2 (in/in) Discharge Instruction: Apply to wound as directed Secondary Dressing ABD Pad 5x9 (in/in) Discharge Instruction: Cover with ABD pad Secured With Kerlix Roll Sterile or Non-Sterile 6-ply 4.5x4 (yd/yd) Discharge Instruction: Apply Kerlix as directed Compression Wrap Compression  Stockings Add-Ons MUBARAK, BEVENS ([id number]) Electronic Signature(s) Signed: 12/07/2020 2:54:56 PM By: Linton Ham MD Entered By: Linton Ham on 12/06/2020 12:38:32 Ryan Day ([id number]) -------------------------------------------------------------------------------- Multi-Disciplinary Care Plan Details Patient Name: Ryan Day Date of Service: 12/06/2020 10:45 AM Medical Record Number: [id number] Patient Account Number: [id number] Date of Birth/Sex: Apr 15, 1951 (69 y.o. M) Treating RN: Ryan Day Primary Care Demetria Lightsey: Charlynne Cousins Other Clinician: Referring Cavan Bearden: Charlynne Cousins Treating Mahiya Kercheval/Extender: Ryan Day in Treatment: 9 Active Inactive Electronic Signature(s) Signed: 01/16/2021 9:08:06 AM By: Ryan Day Previous Signature: 12/06/2020 6:32:03 PM Version By: Ryan Day Entered By: Ryan Cool, BSN, RN, CWS, Kim on 01/16/2021 09:08:06 [place], [place] ([id number]) -------------------------------------------------------------------------------- Pain Assessment Details Patient Name: Ryan Day Date of Service: 12/06/2020 10:45 AM Medical Record Number: [id number] Patient Account Number: [id number] Date of Birth/Sex: [birth year] (69 y.o. M) Treating RN: Ryan Day Primary Care  Shelsey Rieth: Charlynne Cousins Other Clinician: Referring Ane Conerly: Charlynne Cousins Treating Deny Chevez/Extender: Ryan Day in Treatment: 9 Active Problems Location of Pain Severity and Description of Pain Patient Has Paino No Site Locations Rate the pain. Current Pain Level: 0 Pain Management and Medication Current Pain Management: Electronic Signature(s) Signed: 12/06/2020 5:04:06 PM By: Ryan Mouse, Minus Breeding RN Entered By: Ryan Day on 12/06/2020 11:14:01 [place], [place] ([id number]) -------------------------------------------------------------------------------- Patient/Caregiver Education Details Patient Name: Ryan Day Date of Service: 12/06/2020 10:45 AM Medical Record Number: [id number] Patient Account Number: [id number] Date of Birth/Gender: 08/08/1951 (69 y.o. M) Treating RN: Ryan Day Primary Care Physician: Charlynne Cousins Other Clinician: Referring Physician: Charlynne Cousins Treating Physician/Extender: Ryan Day in Treatment: 9 Education Assessment Education Provided To: Caregiver Education Topics Provided Pressure: Handouts: Pressure Ulcers: Care and Offloading, Preventing Pressure Ulcers Methods: Demonstration, Explain/Verbal Responses: State content correctly Electronic Signature(s) Signed: 12/06/2020 6:32:03 PM By: Ryan Day Entered By: Ryan Cool, BSN, RN, CWS, Kim on 12/06/2020 18:30:46 [place], [place] ([id number]) -------------------------------------------------------------------------------- Wound Assessment Details Patient Name: Ryan Day Date of Service: 12/06/2020 10:45 AM Medical Record Number: [id number] Patient Account Number: [id number] Date of Birth/Sex: [birth year] (69 y.o. M) Treating RN: Ryan Day Primary Care Vyncent Overby: Charlynne Cousins Other Clinician: Referring Arabel Barcenas: Charlynne Cousins Treating Tracy Kinner/Extender: Ricard Dillon Weeks in Treatment: 9 Wound Status Wound Number: 1 Primary Etiology: Pressure Ulcer Wound Location: Right Gluteus Wound Status: Open Wounding Event: Pressure Injury Comorbid History: Hypotension, Quadriplegia Date Acquired: 06/23/2020 Weeks Of Treatment: 9 Clustered Wound: No Photos Photo Uploaded By: Ryan Day on 12/06/2020 11:40:50 Wound Measurements Length: (cm) 1.1 Width: (cm) 1 Depth: (cm) 0.2 Area: (cm) 0.864 Volume: (cm) 0.173 % Reduction in Area: 37.5% % Reduction in Volume: -25.4% Epithelialization: None Tunneling: No Undermining: No Wound Description Classification: Category/Stage II Exudate Amount: Medium Exudate Type: Serosanguineous Exudate Color: red, brown Foul  Odor After Cleansing: No Slough/Fibrino Yes Wound Bed Granulation Amount: Large (67-100%) Exposed Structure Granulation Quality: Pink Fascia Exposed: No Necrotic Amount: Small (1-33%) Fat Layer (Subcutaneous Tissue) Exposed: Yes Necrotic Quality: Adherent Slough Tendon Exposed: No Muscle Exposed: No Joint Exposed: No Bone Exposed: No Electronic Signature(s) Signed: 12/06/2020 5:04:06 PM By: Ryan Mouse, Minus Breeding RN Entered By: Ryan Day on 12/06/2020 11:30:40 [place], [place] ([id number]) -------------------------------------------------------------------------------- Wound Assessment Details Patient Name: Ryan Day Date of Service: 12/06/2020 10:45 AM Medical Record Number: [id number] Patient Account Number: [id number] Date of Birth/Sex: Feb 05, 1951 (69 y.o. M) Treating RN: Ryan Day Primary Care Juliyah Mergen: Charlynne Cousins Other Clinician: Referring Shirlean Berman: Charlynne Cousins Treating Rebekah Sprinkle/Extender: Ricard Dillon Weeks in Treatment: 9 Wound Status Wound Number: 2 Primary Etiology: Pressure Ulcer Wound Location: Sacrum Wound Status: Open Wounding Event: Pressure Injury Comorbid History: Hypotension, Quadriplegia Date Acquired: 06/23/2020 Weeks Of Treatment: 9 Clustered Wound: No Photos Photo Uploaded By: Ryan Day on 12/06/2020 11:41:55 Wound Measurements Length: (cm) 2.2 Width: (cm) 2 Depth: (cm) 1.4 Area: (cm) 3.456 Volume: (cm) 4.838 % Reduction in Area: 14.1% % Reduction in Volume: 65.6% Epithelialization: None Tunneling: No Undermining: Yes Starting Position (o'clock): 12 Ending Position (o'clock): 2 Maximum Distance: (cm) 2.4 Wound Description Classification: Category/Stage IV Wound Margin: Flat and Intact Exudate Amount: Medium Exudate Type: Serosanguineous Exudate Color: red, brown Foul Odor After Cleansing: No Slough/Fibrino Yes Wound Bed Granulation Amount: Medium (34-66%) Exposed Structure Granulation Quality: Red,  Pink Fascia Exposed: No Necrotic Amount: Medium (34-66%) Fat Layer (Subcutaneous Tissue) Exposed: Yes Necrotic Quality: Adherent Sl[place] Tendon Exposed: No Muscle Exposed: No Joint Exposed: No Bone  Exposed: No Electronic Signature(s) Signed: 12/06/2020 5:04:06 PM By: Ryan Mouse, Minus Breeding RN Entered By: Ryan Day on 12/06/2020 11:30:26 [place], [place] ([id number]) [place], [place] ([id number]) -------------------------------------------------------------------------------- Wound Assessment Details Patient Name: Ryan Day Date of Service: 12/06/2020 10:45 AM Medical Record Number: [id number] Patient Account Number: [id number] Date of Birth/Sex: [birth year] (69 y.o. M) Treating RN: Ryan Day Primary Care Caiden Monsivais: Charlynne Cousins Other Clinician: Referring Adarrius Graeff: Charlynne Cousins Treating Porchia Sinkler/Extender: Ricard Dillon Weeks in Treatment: 9 Wound Status Wound Number: 3 Primary Etiology: Pressure Ulcer Wound Location: Right Calcaneus Wound Status: Open Wounding Event: Pressure Injury Comorbid History: Hypotension, Quadriplegia Date Acquired: 08/18/2020 Weeks Of Treatment: 9 Clustered Wound: No Photos Photo Uploaded By: Ryan Day on 12/06/2020 11:42:15 Wound Measurements Length: (cm) 2.7 Width: (cm) 2.4 Depth: (cm) 0.1 Area: (cm) 5.089 Volume: (cm) 0.509 % Reduction in Area: -980.5% % Reduction in Volume: -983% Epithelialization: None Tunneling: No Undermining: No Wound Description Classification: Category/Stage II Exudate Amount: None Present Foul Odor After Cleansing: No Slough/Fibrino No Wound Bed Granulation Amount: None Present (0%) Exposed Structure Necrotic Amount: Large (67-100%) Fascia Exposed: No Necrotic Quality: Eschar Fat Layer (Subcutaneous Tissue) Exposed: Yes Tendon Exposed: No Muscle Exposed: No Joint Exposed: No Bone Exposed: No Electronic Signature(s) Signed: 12/06/2020 5:04:06 PM By: Ryan Mouse, Minus Breeding  RN Entered By: Ryan Day on 12/06/2020 11:30:57 [place], [place] ([id number]) -------------------------------------------------------------------------------- Wound Assessment Details Patient Name: Ryan Day Date of Service: 12/06/2020 10:45 AM Medical Record Number: [id number] Patient Account Number: [id number] Date of Birth/Sex: 1951/05/20 (69 y.o. M) Treating RN: Ryan Day Primary Care Grenda Lora: Charlynne Cousins Other Clinician: Referring Nakshatra Klose: Charlynne Cousins Treating Daine Gunther/Extender: Ricard Dillon Weeks in Treatment: 9 Wound Status Wound Number: 4 Primary Etiology: Pressure Ulcer Wound Location: Left Calcaneus Wound Status: Open Wounding Event: Pressure Injury Comorbid History: Hypotension, Quadriplegia Date Acquired: 08/18/2020 Weeks Of Treatment: 9 Clustered Wound: No Photos Photo Uploaded By: Ryan Day on 12/06/2020 11:43:16 Wound Measurements Length: (cm) 4.5 Width: (cm) 5.6 Depth: (cm) 0.1 Area: (cm) 19.792 Volume: (cm) 1.979 % Reduction in Area: -26% % Reduction in Volume: -26% Epithelialization: None Tunneling: No Undermining: No Wound Description Classification: Category/Stage II Wound Margin: Flat and Intact Exudate Amount: None Present Foul Odor After Cleansing: No Slough/Fibrino No Wound Bed Granulation Amount: None Present (0%) Exposed Structure Necrotic Amount: Large (67-100%) Fascia Exposed: No Necrotic Quality: Eschar Fat Layer (Subcutaneous Tissue) Exposed: Yes Tendon Exposed: No Muscle Exposed: No Joint Exposed: No Bone Exposed: No Electronic Signature(s) Signed: 12/06/2020 5:04:06 PM By: Ryan Mouse, Minus Breeding RN Entered By: Ryan Day on 12/06/2020 11:31:11 [place], [place] ([id number]) -------------------------------------------------------------------------------- Vitals Details Patient Name: Ryan Day Date of Service: 12/06/2020 10:45 AM Medical Record Number: [id number] Patient Account  Number: [id number] Date of Birth/Sex: 06/21/1951 (69 y.o. M) Treating RN: Ryan Day Primary Care Miquela Costabile: Charlynne Cousins Other Clinician: Referring Amitai Delaughter: Charlynne Cousins Treating Hawley Pavia/Extender: Ryan Day in Treatment: 9 Vital Signs Time Taken: 11:13 Temperature (F): 97.6 Height (in): 77 Pulse (bpm): 81 Weight (lbs): 240 Respiratory Rate (breaths/min): 18 Body Mass Index (BMI): 28.5 Blood Pressure (mmHg): 108/72 Reference Range: 80 - 120 mg / dl Electronic Signature(s) Signed: 12/06/2020 5:04:06 PM By: Ryan Mouse, Minus Breeding RN Entered By: Ryan Day on 12/06/2020 11:13:52

## 2020-12-07 NOTE — Progress Notes (Signed)
JAKOREY, MCCONATHY ([id number]) Visit Report for 12/06/2020 HPI Details Patient Name: Ryan Day, Ryan Day Date of Service: 12/06/2020 10:45 AM Medical Record Number: [id number] Patient Account Number: [id number] Date of Birth/Sex: 22-Apr-1951 (70 y.o. M) Treating RN: Cornell Barman Primary Care Provider: Charlynne Cousins Other Clinician: Referring Provider: Charlynne Cousins Treating Provider/Extender: Tito Dine in Treatment: 9 History of Present Illness HPI Description: ADMISSION 10/04/2020 [name] is a 70 year old man who is here accompanied by his daughters. 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 He spent a long time in the neuro ICU after his surgical stabilization at [facility]. 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 I do not have much information about the start of this or how it progressed but he was readmitted to [facility] from 08/14/2020 through 08/27/2020 with an infected sacral pressure ulcer. He required a operative debridement by general surgery on 11/24. Wound culture grew Enterococcus and he is just completing 6 weeks of Zosyn under the tutelage of infectious disease. According to his daughters the heel wounds that he has started at that time although I am really not able to get much of a history on these. He came in with small areas on the right heel laterally and 2 on the left. They have been using a wound VAC on the sacrum with a foam cover over the small superficial area on the right buttock under the VAC seal. And I think foam on the heels as well as the great toe on the left. He has a level 2 surface. 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 His CT scan of the pelvis with contrast on 11/23 did show the large left sacral decubitus ulcer with evidence of sacral osteomyelitis. We have regular collagen under wound VAC. He also has a substantial area on the right heel and a more superficial area on the left. I debrided the right heel last week it does not look much better this week. He is definitely going to need ABIs on that side which we will arrange today We are able to document his ABIs today at 1.0 on the right and 1.01 on the left 2/16; we have made some improvement in the overall depth of the sacral wound. He has completed his antibiotics. Unfortunately home health [[facility]] has decided to withdraw from the case. The patient's daughters are changing the wound VAC both are nurses and work 12-hour shifts they are exhausted. We have been using the wound VAC to the area on the sacrum. Comes in with a new area that looks like an injury from lying on to being on the right buttock and right posterior hip. Is possible that the hip may become more ominous. 3/sixteen 1 month follow-up. Patient has the original deep area over the sacrum superficial areas on the right buttock and to eschared areas on the left greater than right heel. We have been using wet-to-dry dressings on his sacrum. He did not respond well to a wound VAC. Silver alginate on the rest of the wound Electronic Signature(s) Signed: 12/07/2020 2:54:56 PM By: Linton Ham MD Entered By: Linton Ham on 12/06/2020 Elliott, Navarino ([id number]) -------------------------------------------------------------------------------- Physical  Exam Details Patient Name: Day, Ryan Date of Service: 12/06/2020 10:45 AM Medical Record Number: [id number] Patient Account Number: [id number] Date of Birth/Sex: [birth year] (70 y.o. M) Treating RN: Cornell Barman Primary Care Provider: Charlynne Cousins Other Clinician: Referring Provider: Charlynne Cousins Treating Provider/Extender: Ricard Dillon Weeks in Treatment: 9 Constitutional Sitting or standing Blood Pressure is within target range for patient.. Pulse regular and within target range for patient.Marland Kitchen Respirations regular, non- labored and within target range.. Temperature is normal and within the target range for the patient.Marland Kitchen appears in no distress. Frail man requiring frequent suctioning. Notes Wound exam; stage IV wound of the sacrum undermining superiorly but I think this is come in a bit. There is no palpable bone. Granulation looks healthy. He has some superficial wound areas around this. oOn the right buttock extending into the posterior greater trochanter very superficial areas oLeft heel large area of thick eschar. At some point I am going to have to remove this. Smaller area on the right with the same thought. Electronic Signature(s) Signed: 12/07/2020 2:54:56 PM By: Linton Ham MD Entered By: Linton Ham on 12/06/2020 12:44:33 Strehle, Zayaan ([id number]) -------------------------------------------------------------------------------- Physician Orders Details Patient Name: Ryan Day Date of Service: 12/06/2020 10:45 AM Medical Record Number: [id number] Patient Account Number: [id number] Date of Birth/Sex: 07/11/1951 (69 y.o. M) Treating RN: Cornell Barman Primary Care Provider: Charlynne Cousins Other Clinician: Referring Provider: Charlynne Cousins Treating Provider/Extender: Tito Dine in Treatment: 9 Verbal / Phone Orders: No Diagnosis Coding Follow-up Appointments o Return Appointment in 1 month [place] for wound care. May utilize formulary equivalent dressing for wound treatment orders unless otherwise specified. Home Health Nurse may visit PRN to address patientos wound care needs. - [facility] Off-Loading o Heel suspension boot - float heels on pillows as well o Turn and reposition every 2 hours Wound  Treatment Wound #1 - Gluteus Wound Laterality: Right Primary Dressing: AquacelAg Advantage Dressing, 4x5 (in/in) Discharge Instructions: Apply to wound as directed Secondary Dressing: ABD Pad 5x9 (in/in) Discharge Instructions: Cover with ABD pad Secured With: 145M Medipore H Soft Cloth Surgical Tape, 2x2 (in/yd) Wound #2 - Sacrum Primary Dressing: Gauze Discharge Instructions: packed lightly, moistened with hydrogel Secondary Dressing: ABD Pad 5x9 (in/in) Discharge Instructions: Cover with ABD pad Secured With: 145M Medipore H Soft Cloth Surgical Tape, 2x2 (in/yd) Wound #3 - Calcaneus Wound Laterality: Right Primary Dressing: AquacelAg Advantage Dressing, 2X2 (in/in) Discharge Instructions: Apply to wound as directed Secondary Dressing: ABD Pad 5x9 (in/in) Discharge Instructions: Cover with ABD pad Secured With: Kerlix Roll Sterile or Non-Sterile 6-ply 4.5x4 (yd/yd) Discharge Instructions: Apply Kerlix as directed Wound #4 - Calcaneus Wound Laterality: Left Primary Dressing: AquacelAg Advantage Dressing, 2X2 (in/in) Discharge Instructions: Apply to wound as directed Secondary Dressing: ABD Pad 5x9 (in/in) Discharge Instructions: Cover with ABD pad Secured With: Kerlix Roll Sterile or Non-Sterile 6-ply 4.5x4 (yd/yd) Discharge Instructions: Apply Kerlix as directed CALVEN, GILKES ([id number]) Electronic Signature(s) Signed: 12/06/2020 6:32:03 PM By: Gretta Cool, BSN, RN, CWS, Kim RN, BSN Signed: 12/07/2020 2:54:56 PM By: Linton Ham MD Entered By: Gretta Cool, BSN, RN, CWS, Kim on 12/06/2020 11:42:46 Tosh, Brenen ([id number]) -------------------------------------------------------------------------------- Problem List Details Patient Name: Ryan Day Date of Service: 12/06/2020 10:45 AM Medical Record Number: [id number] Patient Account Number: [id number] Date of Birth/Sex: [birth year] (69 y.o. M) Treating RN: Cornell Barman Primary Care Provider: Charlynne Cousins Other Clinician: Referring Provider: Charlynne Cousins Treating Provider/Extender: Tito Dine in Treatment: 9 Active Problems ICD-10 Encounter Code Description Active Date  MDM Diagnosis L89.154 Pressure ulcer of sacral region, stage 4 10/04/2020 No Yes L89.312 Pressure ulcer of right buttock, stage 2 10/04/2020 No Yes L89.620 Pressure ulcer of left heel, unstageable 11/08/2020 No Yes L89.610 Pressure ulcer of right heel, unstageable 10/04/2020 No Yes S14.104D Unspecified injury at C4 level of cervical spinal cord, subsequent 10/04/2020 No Yes encounter Inactive Problems Resolved Problems Electronic Signature(s) Signed: 12/07/2020 2:54:56 PM By: Linton Ham MD Entered By: Linton Ham on 12/06/2020 12:38:24 Polidori, Nirvaan ([id number]) -------------------------------------------------------------------------------- Progress Note Details Patient Name: Ryan Day Date of Service: 12/06/2020 10:45 AM Medical Record Number: [id number] Patient Account Number: [id number] Date of Birth/Sex: 11-Apr-1951 (69 y.o. M) Treating RN: Cornell Barman Primary Care Provider: Charlynne Cousins Other Clinician: Referring Provider: Charlynne Cousins Treating Provider/Extender: Tito Dine in Treatment: 9 Subjective History of Present Illness (HPI) ADMISSION 10/04/2020 [name] is a 70 year old man who is here accompanied by his daughters. 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 He spent a long time in the neuro ICU after his surgical stabilization at [facility]. 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 I do not have much information about the start of this or how it progressed but he was readmitted to [facility] from 08/14/2020 through 08/27/2020 with an infected sacral pressure ulcer. He required a operative debridement by general surgery on 11/24. Wound culture grew Enterococcus and he is just completing 6 weeks of Zosyn under the  tutelage of infectious disease. According to his daughters the heel wounds that he has started at that time although I am really not able to get much of a history on these. He came in with small areas on the right heel laterally and 2 on the left. They have been using a wound VAC on the sacrum with a foam cover over the small superficial area on the right buttock under the VAC seal. And I think foam on the heels as well as the great toe on the left. He has a level 2 surface. 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 His CT scan of the pelvis with contrast on 11/23 did show the large left sacral decubitus ulcer with evidence of sacral osteomyelitis. We have regular collagen under wound VAC. He also has a substantial area on the right heel and a more superficial area on the left. I debrided the right heel last week it does not look much better this week. He is definitely going to need ABIs on that side which we will arrange today We are able to document his ABIs today at 1.0 on the right and 1.01 on the left 2/16; we have made some improvement in the overall depth of the sacral wound. He has completed his antibiotics. Unfortunately home health [[facility]] has decided to withdraw from the case. The patient's daughters are changing the wound VAC both are nurses and work 12-hour shifts they are exhausted. We have been using the wound VAC to the area on the sacrum. Comes in with a new area that looks like an injury from lying on to being on the right buttock and right posterior hip. Is possible that the hip may become more ominous.  3/sixteen 1 month follow-up. Patient has the original deep area over the sacrum superficial areas on the  right buttock and to eschared areas on the left greater than right heel. We have been using wet-to-dry dressings on his sacrum. He did not respond well to a wound VAC. Silver alginate on the rest of the wound Objective Constitutional Sitting or standing Blood Pressure is within target range for patient.. Pulse regular and within target range for patient.Marland Kitchen Respirations regular, non- labored and within target range.. Temperature is normal and within the target range for the patient.Marland Kitchen appears in no distress. Frail man requiring frequent suctioning. Vitals Time Taken: 11:13 AM, Height: 77 in, Weight: 240 lbs, BMI: 28.5, Temperature: 97.6 F, Pulse: 81 bpm, Respiratory Rate: 18 breaths/min, Blood Pressure: 108/72 mmHg. General Notes: Wound exam; stage IV wound of the sacrum undermining superiorly but I think this is come in a bit. There is no palpable bone. Granulation looks healthy. He has some superficial wound areas around this. On the right buttock extending into the posterior greater trochanter very superficial areas Left heel large area of thick eschar. At some point I am going to have to remove this. Smaller area on the right with the same thought. Integumentary (Hair, Skin) Wound #1 status is Open. Original cause of wound was Pressure Injury. The date acquired was: 06/23/2020. The wound has been in treatment 9 weeks. The wound is located on the Right Gluteus. The wound measures 1.1cm length x 1cm width x 0.2cm depth; 0.864cm^2 area and 0.173cm^3 volume. There is Fat Layer (Subcutaneous Tissue) exposed. There is no tunneling or undermining noted. There is a medium amount Gong, Antwain ([id number]) of serosanguineous drainage noted. There is large (67-100%) pink granulation within the wound bed. There is a small (1-33%) amount of necrotic tissue within the wound bed including Adherent Slough. Wound #2 status is Open. Original cause of wound was Pressure Injury. The date acquired was: 06/23/2020.  The wound has been in treatment 9 weeks. The wound is located on the Sacrum. The wound measures 2.2cm length x 2cm width x 1.4cm depth; 3.456cm^2 area and 4.838cm^3 volume. There is Fat Layer (Subcutaneous Tissue) exposed. There is no tunneling noted, however, there is undermining starting at 12:00 and ending at 2:00 with a maximum distance of 2.4cm. There is a medium amount of serosanguineous drainage noted. The wound margin is flat and intact. There is medium (34-66%) red, pink granulation within the wound bed. There is a medium (34-66%) amount of necrotic tissue within the wound bed including Adherent Slough. Wound #3 status is Open. Original cause of wound was Pressure Injury. The date acquired was: 08/18/2020. The wound has been in treatment 9 weeks. The wound is located on the Right Calcaneus. The wound measures 2.7cm length x 2.4cm width x 0.1cm depth; 5.089cm^2 area and 0.509cm^3 volume. There is Fat Layer (Subcutaneous Tissue) exposed. There is no tunneling or undermining noted. There is a none present amount of drainage noted. There is no granulation within the wound bed. There is a large (67-100%) amount of necrotic tissue within the wound bed including Eschar. Wound #4 status is Open. Original cause of wound was Pressure Injury. The date acquired was: 08/18/2020. The wound has been in treatment 9 weeks. The wound is located on the Left Calcaneus. The wound measures 4.5cm length x 5.6cm width x 0.1cm depth; 19.792cm^2 area and 1.979cm^3 volume. There is Fat Layer (Subcutaneous Tissue) exposed. There is no tunneling or undermining noted. There is a none present amount of  drainage noted. The wound margin is flat and intact. There is no granulation within the wound bed. There is a large (67-100%) amount of necrotic tissue within the wound bed including Eschar. Assessment Active Problems ICD-10 Pressure ulcer of sacral region, stage 4 Pressure ulcer of right buttock, stage 2 Pressure  ulcer of left heel, unstageable Pressure ulcer of right heel, unstageable Unspecified injury at C4 level of cervical spinal cord, subsequent encounter Plan Follow-up Appointments: Return Appointment in 1 month Home Health: [place] for wound care. May utilize formulary equivalent dressing for wound treatment orders unless otherwise specified. Home Health Nurse may visit PRN to address patient s wound care needs. [facility] Off-Loading: Heel suspension boot - float heels on pillows as well Turn and reposition every 2 hours WOUND #1: - Gluteus Wound Laterality: Right Primary Dressing: AquacelAg Advantage Dressing, 4x5 (in/in) Discharge Instructions: Apply to wound as directed Secondary Dressing: ABD Pad 5x9 (in/in) Discharge Instructions: Cover with ABD pad Secured With: 56M Medipore H Soft Cloth Surgical Tape, 2x2 (in/yd) WOUND #2: - Sacrum Wound Laterality: Primary Dressing: Gauze Discharge Instructions: packed lightly, moistened with hydrogel Secondary Dressing: ABD Pad 5x9 (in/in) Discharge Instructions: Cover with ABD pad Secured With: 56M Medipore H Soft Cloth Surgical Tape, 2x2 (in/yd) WOUND #3: - Calcaneus Wound Laterality: Right Primary Dressing: AquacelAg Advantage Dressing, 2X2 (in/in) Discharge Instructions: Apply to wound as directed Secondary Dressing: ABD Pad 5x9 (in/in) Discharge Instructions: Cover with ABD pad Secured With: Kerlix Roll Sterile or Non-Sterile 6-ply 4.5x4 (yd/yd) Discharge Instructions: Apply Kerlix as directed WOUND #4: - Calcaneus Wound Laterality: Left Primary Dressing: AquacelAg Advantage Dressing, 2X2 (in/in) Discharge Instructions: Apply to wound as directed Secondary Dressing: ABD Pad 5x9 (in/in) Discharge Instructions: Cover with ABD pad Secured With: Kerlix Roll Sterile or Non-Sterile 6-ply 4.5x4 (yd/yd) Discharge Instructions: Apply Kerlix as directed Alvillar, Drystan ([id number]) 1. We continued with a wet-to-dry dressing on  the sacrum 2. Silver alginate to the other wounds 3. When he is next here I am going to have to try to take some of the thick eschar off his heels especially on the left Electronic Signature(s) Signed: 12/07/2020 2:54:56 PM By: Linton Ham MD Entered By: Linton Ham on 12/06/2020 12:44:58 Dickie, Hasani ([id number]) -------------------------------------------------------------------------------- SuperBill Details Patient Name: Ryan Day Date of Service: 12/06/2020 Medical Record Number: [id number] Patient Account Number: [id number] Date of Birth/Sex: 12/11/1950 (69 y.o. M) Treating RN: Cornell Barman Primary Care Provider: Charlynne Cousins Other Clinician: Referring Provider: Charlynne Cousins Treating Provider/Extender: Tito Dine in Treatment: 9 Diagnosis Coding ICD-10 Codes Code Description L89.154 Pressure ulcer of sacral region, stage 4 L89.312 Pressure ulcer of right buttock, stage 2 L89.620 Pressure ulcer of left heel, unstageable L89.610 Pressure ulcer of right heel, unstageable S14.104D Unspecified injury at C4 level of cervical spinal cord, subsequent encounter Physician Procedures CPT4 Code: [id number] Description: 93790 - WC PHYS LEVEL 3 - EST PT Modifier: Quantity: 1 CPT4 Code: Description: ICD-10 Diagnosis Description L89.154 Pressure ulcer of sacral region, stage 4 L89.312 Pressure ulcer of right buttock, stage 2 L89.620 Pressure ulcer of left heel, unstageable L89.610 Pressure ulcer of right heel, unstageable Modifier: Quantity: Electronic Signature(s) Signed: 12/07/2020 2:54:56 PM By: Linton Ham MD Entered By: Linton Ham on 12/06/2020 12:45:48

## 2020-12-07 NOTE — Sepsis Progress Note (Signed)
Notified bedside nurse of need to draw blood cultures.  

## 2020-12-07 NOTE — ED Notes (Signed)
Respiratory placing patient on ventilator.

## 2020-12-07 NOTE — ED Notes (Signed)
Pt with O2  Sat dropping to 75-80% on room air, Non rebreather placed, sats increased to 100% on nonrebreather, RT called. RT to see pt.

## 2020-12-07 NOTE — ED Notes (Addendum)
O2 saturation 97% with respirations assisted via BVM through trach.

## 2020-12-07 NOTE — ED Notes (Signed)
Per MD Tamala Julian, maintain MAP between 60 and 65.

## 2020-12-08 ENCOUNTER — Telehealth: Payer: Self-pay

## 2020-12-08 DIAGNOSIS — R319 Hematuria, unspecified: Secondary | ICD-10-CM | POA: Diagnosis not present

## 2020-12-08 DIAGNOSIS — B9562 Methicillin resistant Staphylococcus aureus infection as the cause of diseases classified elsewhere: Secondary | ICD-10-CM

## 2020-12-08 DIAGNOSIS — R7881 Bacteremia: Secondary | ICD-10-CM

## 2020-12-08 DIAGNOSIS — R8271 Bacteriuria: Secondary | ICD-10-CM | POA: Diagnosis not present

## 2020-12-08 DIAGNOSIS — R6521 Severe sepsis with septic shock: Secondary | ICD-10-CM

## 2020-12-08 DIAGNOSIS — L89154 Pressure ulcer of sacral region, stage 4: Secondary | ICD-10-CM | POA: Diagnosis not present

## 2020-12-08 DIAGNOSIS — B952 Enterococcus as the cause of diseases classified elsewhere: Secondary | ICD-10-CM

## 2020-12-08 DIAGNOSIS — A419 Sepsis, unspecified organism: Secondary | ICD-10-CM

## 2020-12-08 LAB — BLOOD CULTURE ID PANEL (REFLEXED) - BCID2
A.calcoaceticus-baumannii: NOT DETECTED
Bacteroides fragilis: NOT DETECTED
CTX-M ESBL: NOT DETECTED
Candida albicans: NOT DETECTED
Candida auris: NOT DETECTED
Candida glabrata: NOT DETECTED
Candida krusei: NOT DETECTED
Candida parapsilosis: NOT DETECTED
Candida tropicalis: NOT DETECTED
Carbapenem resist OXA 48 LIKE: NOT DETECTED
Carbapenem resistance IMP: NOT DETECTED
Carbapenem resistance KPC: NOT DETECTED
Carbapenem resistance NDM: NOT DETECTED
Carbapenem resistance VIM: NOT DETECTED
Cryptococcus neoformans/gattii: NOT DETECTED
Enterobacter cloacae complex: DETECTED — AB
Enterobacterales: DETECTED — AB
Enterococcus Faecium: NOT DETECTED
Enterococcus faecalis: DETECTED — AB
Escherichia coli: NOT DETECTED
Haemophilus influenzae: NOT DETECTED
Klebsiella aerogenes: NOT DETECTED
Klebsiella oxytoca: NOT DETECTED
Klebsiella pneumoniae: NOT DETECTED
Listeria monocytogenes: NOT DETECTED
Meth resistant mecA/C and MREJ: DETECTED — AB
Neisseria meningitidis: NOT DETECTED
Proteus species: NOT DETECTED
Pseudomonas aeruginosa: NOT DETECTED
Salmonella species: NOT DETECTED
Serratia marcescens: NOT DETECTED
Staphylococcus aureus (BCID): DETECTED — AB
Staphylococcus epidermidis: NOT DETECTED
Staphylococcus lugdunensis: NOT DETECTED
Staphylococcus species: DETECTED — AB
Stenotrophomonas maltophilia: NOT DETECTED
Streptococcus agalactiae: NOT DETECTED
Streptococcus pneumoniae: NOT DETECTED
Streptococcus pyogenes: NOT DETECTED
Streptococcus species: NOT DETECTED
Vancomycin resistance: NOT DETECTED

## 2020-12-08 LAB — BLOOD GAS, ARTERIAL
Acid-base deficit: 0.4 mmol/L (ref 0.0–2.0)
Bicarbonate: 24.8 mmol/L (ref 20.0–28.0)
FIO2: 0.4
Mechanical Rate: 24
O2 Saturation: 98.4 %
PEEP: 8 cmH2O
Patient temperature: 37
RATE: 24 resp/min
pCO2 arterial: 42 mmHg (ref 32.0–48.0)
pH, Arterial: 7.38 (ref 7.350–7.450)
pO2, Arterial: 115 mmHg — ABNORMAL HIGH (ref 83.0–108.0)

## 2020-12-08 LAB — BASIC METABOLIC PANEL
Anion gap: 12 (ref 5–15)
BUN: 56 mg/dL — ABNORMAL HIGH (ref 8–23)
CO2: 23 mmol/L (ref 22–32)
Calcium: 9.5 mg/dL (ref 8.9–10.3)
Chloride: 115 mmol/L — ABNORMAL HIGH (ref 98–111)
Creatinine, Ser: 1.87 mg/dL — ABNORMAL HIGH (ref 0.61–1.24)
GFR, Estimated: 39 mL/min — ABNORMAL LOW (ref >60)
Glucose, Bld: 110 mg/dL — ABNORMAL HIGH (ref 70–99)
Potassium: 3.8 mmol/L (ref 3.5–5.1)
Sodium: 150 mmol/L — ABNORMAL HIGH (ref 135–145)

## 2020-12-08 LAB — CBC
HCT: 30.5 % — ABNORMAL LOW (ref 39.0–52.0)
Hemoglobin: 9.7 g/dL — ABNORMAL LOW (ref 13.0–17.0)
MCH: 30.3 pg (ref 26.0–34.0)
MCHC: 31.8 g/dL (ref 30.0–36.0)
MCV: 95.3 fL (ref 80.0–100.0)
Platelets: 181 10*3/uL (ref 150–400)
RBC: 3.2 MIL/uL — ABNORMAL LOW (ref 4.22–5.81)
RDW: 16.8 % — ABNORMAL HIGH (ref 11.5–15.5)
WBC: 17.6 10*3/uL — ABNORMAL HIGH (ref 4.0–10.5)
nRBC: 1.1 % — ABNORMAL HIGH (ref 0.0–0.2)

## 2020-12-08 LAB — PROCALCITONIN: Procalcitonin: >150 ng/mL

## 2020-12-08 LAB — GLUCOSE, CAPILLARY
Glucose-Capillary: 88 mg/dL (ref 70–99)
Glucose-Capillary: 105 mg/dL — ABNORMAL HIGH (ref 70–99)
Glucose-Capillary: 107 mg/dL — ABNORMAL HIGH (ref 70–99)
Glucose-Capillary: 97 mg/dL (ref 70–99)
Glucose-Capillary: 113 mg/dL — ABNORMAL HIGH (ref 70–99)
Glucose-Capillary: 115 mg/dL — ABNORMAL HIGH (ref 70–99)

## 2020-12-08 LAB — MAGNESIUM: Magnesium: 2.1 mg/dL (ref 1.7–2.4)

## 2020-12-08 LAB — PHOSPHORUS: Phosphorus: 4.3 mg/dL (ref 2.5–4.6)

## 2020-12-08 LAB — LACTIC ACID, PLASMA
Lactic Acid, Venous: 4.4 mmol/L (ref 0.5–1.9)
Lactic Acid, Venous: 7.3 mmol/L (ref 0.5–1.9)
Lactic Acid, Venous: 7.4 mmol/L (ref 0.5–1.9)
Lactic Acid, Venous: 5.1 mmol/L (ref 0.5–1.9)

## 2020-12-08 LAB — SODIUM: Sodium: 146 mmol/L — ABNORMAL HIGH (ref 135–145)

## 2020-12-08 LAB — PATHOLOGIST SMEAR REVIEW

## 2020-12-08 MED ORDER — LACTATED RINGERS IV BOLUS
500.0000 mL | Freq: Once | INTRAVENOUS | Status: AC
  Administered 2020-12-08: 500 mL via INTRAVENOUS

## 2020-12-08 MED ORDER — MIDAZOLAM HCL 2 MG/2ML IJ SOLN: 6.0000 mg | Freq: Once | INTRAMUSCULAR | Status: AC

## 2020-12-08 MED ORDER — FENTANYL CITRATE (PF) 100 MCG/2ML IJ SOLN
50.0000 ug | INTRAMUSCULAR | Status: DC | PRN
  Administered 2020-12-08 – 2020-12-09 (×3): 50 ug via INTRAVENOUS
  Filled 2020-12-08 (×3): qty 2

## 2020-12-08 MED ORDER — VANCOMYCIN HCL 1750 MG/350ML IV SOLN: 1750.0000 mg | INTRAVENOUS | Status: DC

## 2020-12-08 MED ORDER — VANCOMYCIN VARIABLE DOSE PER UNSTABLE RENAL FUNCTION (PHARMACIST DOSING): Status: DC

## 2020-12-08 MED ORDER — ALBUTEROL SULFATE (2.5 MG/3ML) 0.083% IN NEBU
2.5000 mg | INHALATION_SOLUTION | Freq: Three times a day (TID) | RESPIRATORY_TRACT | Status: DC
  Administered 2020-12-08 – 2020-12-15 (×21): 2.5 mg via RESPIRATORY_TRACT
  Filled 2020-12-08 (×21): qty 3

## 2020-12-08 MED ORDER — VANCOMYCIN HCL 2000 MG/400ML IV SOLN
2000.0000 mg | Freq: Once | INTRAVENOUS | Status: AC
  Administered 2020-12-08: 2000 mg via INTRAVENOUS
  Filled 2020-12-08: qty 400

## 2020-12-08 MED ORDER — FREE WATER
150.0000 mL | Status: DC
  Administered 2020-12-08 – 2020-12-09 (×6): 150 mL

## 2020-12-08 MED ORDER — ACETYLCYSTEINE 20 % IN SOLN
4.0000 mL | Freq: Three times a day (TID) | RESPIRATORY_TRACT | Status: DC
  Administered 2020-12-08 – 2020-12-15 (×22): 4 mL via RESPIRATORY_TRACT
  Filled 2020-12-08 (×22): qty 4

## 2020-12-08 MED ORDER — SODIUM CHLORIDE 0.45 % IV BOLUS
500.0000 mL | Freq: Once | INTRAVENOUS | Status: AC
  Administered 2020-12-08: 500 mL via INTRAVENOUS

## 2020-12-08 MED ORDER — SODIUM CHLORIDE 0.9 % IV SOLN
2.0000 g | Freq: Three times a day (TID) | INTRAVENOUS | Status: DC
  Administered 2020-12-08: 2 g via INTRAVENOUS
  Filled 2020-12-08 (×3): qty 2

## 2020-12-08 MED ORDER — FLUDROCORTISONE ACETATE 0.1 MG PO TABS
200.0000 ug | ORAL_TABLET | Freq: Every day | ORAL | Status: DC
  Administered 2020-12-08 – 2020-12-21 (×14): 200 ug
  Filled 2020-12-08 (×15): qty 2

## 2020-12-08 MED ORDER — CIPROFLOXACIN HCL 500 MG PO TABS: 500.0000 mg | ORAL_TABLET | Freq: Two times a day (BID) | ORAL | 0 refills | Status: AC

## 2020-12-08 MED ORDER — ALBUTEROL SULFATE (2.5 MG/3ML) 0.083% IN NEBU
INHALATION_SOLUTION | RESPIRATORY_TRACT | Status: AC
  Administered 2020-12-08: 2.5 mg
  Filled 2020-12-08: qty 3

## 2020-12-08 MED ORDER — VANCOMYCIN HCL 1500 MG/300ML IV SOLN
1500.0000 mg | Freq: Two times a day (BID) | INTRAVENOUS | Status: DC
  Filled 2020-12-08: qty 300

## 2020-12-08 MED ORDER — NOREPINEPHRINE 16 MG/250ML-% IV SOLN
0.0000 ug/min | INTRAVENOUS | Status: DC
  Administered 2020-12-08 (×2): 24 ug/min via INTRAVENOUS
  Administered 2020-12-09: 13 ug/min via INTRAVENOUS
  Filled 2020-12-08 (×4): qty 250

## 2020-12-08 MED ORDER — MIDAZOLAM HCL 2 MG/2ML IJ SOLN
INTRAMUSCULAR | Status: AC
  Administered 2020-12-08: 6 mg via INTRAVENOUS
  Filled 2020-12-08: qty 6

## 2020-12-08 MED ORDER — VANCOMYCIN HCL 500 MG/100ML IV SOLN
500.0000 mg | Freq: Once | INTRAVENOUS | Status: DC
  Filled 2020-12-08: qty 100

## 2020-12-08 MED ORDER — MUPIROCIN 2 % EX OINT
1.0000 "application " | TOPICAL_OINTMENT | Freq: Two times a day (BID) | CUTANEOUS | Status: AC
  Administered 2020-12-08 – 2020-12-12 (×10): 1 via NASAL
  Filled 2020-12-08: qty 22

## 2020-12-08 MED ORDER — SODIUM CHLORIDE 0.9 % IV SOLN
2.0000 g | Freq: Two times a day (BID) | INTRAVENOUS | Status: DC
  Administered 2020-12-08: 2 g via INTRAVENOUS
  Filled 2020-12-08 (×3): qty 2

## 2020-12-08 NOTE — Telephone Encounter (Signed)
Pharmacy called and clairified that patient only needs 7 days worth 14 tabs.

## 2020-12-08 NOTE — Consult Note (Signed)
[facility] SURGICAL CONSULTATION NOTE (initial) - cpt: 67124   HISTORY OF PRESENT ILLNESS (HPI):  70 y.o. male known to our service during previous admission in November 2021 for debridement and wound vac placement of sacral ulceration. He presented to [facility] ED on 03/17 for evaluation of hematuria. Patient's wife provided initially history, and I obtained this through chart review. No family at bedside at time of my evaluation. Reportedly, wife felt that he was not quite himself over the last few days. She also noted cloudy urine and hematuria the day prior to presentation. He did have a foley placed about 2 weeks prior to this and had about 7 days of Abx.   Work up in the ED was concerning for leukopenia to 2.0K (now 17.6K), sCr - 1.02, lactic acidosis to 2.8 (now 7.4), UA was positive for leukocytes, WBC, and bacteria (Cx pending), BCx growing gram negative rods & gram positive cocci, and he underwent CT Abdomen/Pelvis which was concerning for cystitis and possible bilateral atelectasis vs infiltrate. He was ultimately admitted to [facility]. He is currently requiring Levophed at 24 mcg/min and vasopressin at 0.03 units. Currently on Cefepime and Vancomycin.   Surgery is consulted by PCCM physician Dr. Neomia Glass, MD in this context for evaluation and management of sacral wound in setting of sepsis.   PAST MEDICAL HISTORY (PMH):  Past Medical History:  Diagnosis Date  . Acute on chronic respiratory failure with hypoxia ([place])   . Acute renal injury due to hypovolemia ([place])   . Autonomic instability   . Cardiac arrest ([place])   . Cervical spinal cord injury, sequela ([place])   . Elevated alkaline phosphatase level   . 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 Hyperlipidemia   . Hypertension      PAST SURGICAL HISTORY ([place]):  Past Surgical History:  Procedure Laterality Date  . APPLICATION OF WOUND VAC N/A 08/16/2020   Procedure: APPLICATION OF WOUND VAC;  Surgeon:  Fredirick Maudlin, MD;  Location: [facility] ORS;  Service: General;  Laterality: N/A;  . COLONOSCOPY WITH PROPOFOL N/A 12/05/2017   Procedure: COLONOSCOPY WITH PROPOFOL;  Surgeon: Lin Landsman, MD;  Location: [facility] ENDOSCOPY;  Service: Gastroenterology;  Laterality: N/A;  . ESOPHAGOGASTRODUODENOSCOPY  12/05/2017   Procedure: ESOPHAGOGASTRODUODENOSCOPY (EGD);  Surgeon: Lin Landsman, MD;  Location: [place];  Service: Gastroenterology;;  . INCISION AND DRAINAGE ABSCESS N/A 08/16/2020   Procedure: INCISION AND DRAINAGE ABSCESS, SACRAL;  Surgeon: Fredirick Maudlin, MD;  Location: [facility] ORS;  Service: General;  Laterality: N/A;     MEDICATIONS:  Prior to Admission medications   Medication Sig Start Date End Date Taking? Authorizing Provider  acetaminophen (TYLENOL) 160 MG/5ML solution Place 10-20 mLs (320-640 mg total) into feeding tube every 4 (four) hours as needed for mild pain. 07/18/20   Love, Ivan Anchors, PA-C  ascorbic acid (VITAMIN C) 500 MG tablet Place 1 tablet (500 mg total) into feeding tube 2 (two) times daily. 07/18/20   Love, Ivan Anchors, PA-C  baclofen (LIORESAL) 10 MG tablet Take 1 tablet (10 mg total) by mouth 3 (three) times daily. 11/12/20   Johnson, Megan P, DO  bisacodyl (DULCOLAX) 10 MG suppository Place 1 suppository (10 mg total) rectally at bedtime. 07/20/20   Love, Ivan Anchors, PA-C  chlorhexidine (PERIDEX) 0.12 % solution 15 mLs by Mouth Rinse route 2 (two) times daily. 07/20/20   Love, Ivan Anchors, PA-C  ciprofloxacin (CIPRO) 500 MG tablet Take 1 tablet (500 mg total) by mouth 2 (  two) times daily for 7 days. 12/08/20 12/15/20  Charlynne Cousins, MD  collagenase (SANTYL) ointment Apply topically daily. Patient not taking: Reported on 11/07/2020 07/20/20   Love, Ivan Anchors, PA-C  diclofenac Sodium (VOLTAREN) 1 % GEL Apply 4 g topically 4 (four) times daily. 08/31/20   Kathrine Haddock, NP  famotidine (PEPCID) 20 MG tablet Place 1 tablet (20 mg total) into feeding tube daily. 07/20/20    Love, Ivan Anchors, PA-C  ferrous sulfate 300 (60 Fe) MG/5ML syrup Place 1.3 mLs (78 mg total) into feeding tube daily. 07/20/20   Love, Ivan Anchors, PA-C  fludrocortisone (FLORINEF) 0.1 MG tablet Take 200 mcg by mouth daily. 11/02/20   [provider]  FLUoxetine (PROZAC) 20 MG/5ML solution Place 10 mLs (40 mg total) into feeding tube daily. 11/22/20   Vigg, Avanti, MD  guaiFENesin 200 MG tablet Place 2 tablets (400 mg total) into feeding tube every 6 (six) hours. 07/20/20   Love, Ivan Anchors, PA-C  lidocaine (LIDODERM) 5 % Place 1 patch onto the skin daily. Remove & Discard patch within 12 hours or as directed by MD 07/20/20   Love, Ivan Anchors, PA-C  midodrine (PROAMATINE) 10 MG tablet Place 1 tablet (10 mg total) into feeding tube 3 (three) times daily with meals. 11/04/20   Park Liter P, DO  Mouthwashes (MOUTH RINSE) LIQD solution 15 mLs by Mouth Rinse route 2 times daily at 12 noon and 4 pm. 07/20/20   Love, Ivan Anchors, PA-C  Multiple Vitamin (MULTIVITAMIN) LIQD Place 5 mLs into feeding tube daily. 07/20/20   Love, Ivan Anchors, PA-C  nutrition supplement, JUVEN, (JUVEN) PACK Place 1 packet into feeding tube 2 (two) times daily between meals. 07/20/20   Love, Ivan Anchors, PA-C  Nutritional Supplements (FEEDING SUPPLEMENT, OSMOLITE 1.5 CAL,) LIQD Place 474 mLs into feeding tube 4 (four) times daily. 07/20/20   Love, Ivan Anchors, PA-C  Nutritional Supplements (FEEDING SUPPLEMENT, PROSOURCE TF,) liquid Place 45 mLs into feeding tube 2 (two) times daily. 07/20/20   Love, Ivan Anchors, PA-C  ondansetron (ZOFRAN) 4 MG tablet Place 1 tablet (4 mg total) into feeding tube every 8 (eight) hours as needed for nausea, vomiting or refractory nausea / vomiting. 07/20/20   Love, Ivan Anchors, PA-C  polycarbophil (FIBERCON) 625 MG tablet Place 1 tablet (625 mg total) into feeding tube daily. 07/20/20   Love, Ivan Anchors, PA-C  rivaroxaban (XARELTO) 10 MG TABS tablet Take one tablet per tube daily 08/08/20   [name], MD   saccharomyces boulardii (FLORASTOR) 250 MG capsule Place 1 capsule (250 mg total) into feeding tube 2 (two) times daily. 07/20/20   Love, Ivan Anchors, PA-C  sennosides (SENOKOT) 8.8 MG/5ML syrup Place 5 mLs into feeding tube daily at 6 (six) AM. 07/20/20   Love, Ivan Anchors, PA-C  simethicone (MYLICON) 40 OZ/3.6UY drops Place 1.2 mLs (80 mg total) into feeding tube 4 (four) times daily. 07/20/20   Love, Ivan Anchors, PA-C  traZODone (DESYREL) 50 MG tablet Place 1 tablet (50 mg total) into feeding tube at bedtime. 10/23/20   Jon Billings, NP  Water For Irrigation, Sterile (FREE WATER) SOLN Place 400 mLs into feeding tube every 4 (four) hours. Patient taking differently: Place 300 mLs into feeding tube every 4 (four) hours. 07/20/20   Love, Ivan Anchors, PA-C  zinc sulfate 220 (50 Zn) MG capsule Place 1 capsule (220 mg total) into feeding tube daily. 07/20/20   Bary Leriche, PA-C     ALLERGIES:  Allergies  Allergen Reactions  . Shrimp (Diagnostic)     Experiences facial droop when eating seafood     SOCIAL HISTORY:  Social History   Socioeconomic History  . Marital status: Married    Spouse name: Not on file  . Number of children: Not on file  . Years of education: Not on file  . Highest education level: Not on file  Occupational History  . Not on file  Tobacco Use  . Smoking status: Never Smoker  . Smokeless tobacco: Never Used  Vaping Use  . Vaping Use: Never used  Substance and Sexual Activity  . Alcohol use: Not Currently  . Drug use: No  . Sexual activity: Not on file  Other Topics Concern  . Not on file  Social History Narrative  . Not on file   Social Determinants of Health   Financial Resource Strain: Not on file  Food Insecurity: Not on file  Transportation Needs: Not on file  Physical Activity: Not on file  Stress: Not on file  Social Connections: Not on file  Intimate Partner Violence: Not on file     FAMILY HISTORY:  Family History  Problem Relation Age of  Onset  . Cancer Father        lung  . Cancer Brother        throat  . Heart disease Brother 21  . Liver cancer Brother   . Alcohol abuse Brother       REVIEW OF SYSTEMS:  Review of Systems  Unable to perform ROS: Patient nonverbal  Skin:       + Sacral Wound    VITAL SIGNS:  Temp:  [97.1 F (36.2 C)-101.84 F (38.8 C)] 100.94 F (38.3 C) (03/18 0900) Pulse Rate:  [94-136] 109 (03/18 0900) Resp:  [16-81] 41 (03/18 0800) BP: (64-149)/(34-101) 94/61 (03/18 0900) SpO2:  [88 %-100 %] 98 % (03/18 0900) FiO2 (%):  [35 %-100 %] 35 % (03/18 0902) Weight:  [108.9 kg-114 kg] 114 kg (03/17 2050)     Height: 6' 5.01" (195.6 cm) Weight: 114 kg BMI (Calculated): 29.8   INTAKE/OUTPUT:  03/17 0701 - 03/18 0700 In: 2093.4 [I.V.:1674; IV Piggyback:419.4] Out: 860 [Urine:860]  PHYSICAL EXAM:  Physical Exam Vitals and nursing note reviewed. Exam conducted with a chaperone present.  Constitutional:      General: He is not in acute distress.    Appearance: Normal appearance. He is not ill-appearing.     Comments: Patient resting in bed, no family present, able to nod head Yes/No to answer questions   HENT:     Head: Normocephalic and atraumatic.     Mouth/Throat:     Mouth: Mucous membranes are moist.     Pharynx: Oropharynx is clear.  Eyes:     General: No scleral icterus.    Conjunctiva/sclera: Conjunctivae normal.  Neck:     Trachea: Tracheostomy present.     Comments: Tracheostomy present Cardiovascular:     Rate and Rhythm: Regular rhythm. Tachycardia present.     Pulses: Normal pulses.     Heart sounds: No murmur heard.   Pulmonary:     Comments: On ventilator  Abdominal:     General: There is no distension.     Comments: Feeding tube in place, site CDI  Genitourinary:    Comments: Foley in place Musculoskeletal:     Right lower leg: No edema.     Left lower leg: No edema.  Skin:    General: Skin is warm and  dry.     Comments: Sacral ulceration, wound bed appears  healthy, minimal fibrinous tissue, no evidence of necrosis or purulence   Neurological:     Mental Status: He is alert. Mental status is at baseline.  Psychiatric:     Comments: Unable to reliably assess      Sacral Wound (12/08/2020):      Labs:  CBC Latest Ref Rng & Units 12/08/2020 12/07/2020 08/24/2020  WBC 4.0 - 10.5 K/uL 17.6(H) 2.0(L) 6.6  Hemoglobin 13.0 - 17.0 g/dL 9.7(L) 11.1(L) 7.8(L)  Hematocrit 39.0 - 52.0 % 30.5(L) 36.1(L) 24.8(L)  Platelets 150 - 400 K/uL 181 213 234   CMP Latest Ref Rng & Units 12/08/2020 12/07/2020 08/25/2020  Glucose 70 - 99 mg/dL 110(H) 147(H) 94  BUN 8 - 23 mg/dL 56(H) 52(H) 18  Creatinine 0.61 - 1.24 mg/dL 1.87(H) 1.02 0.63  Sodium 135 - 145 mmol/L 150(H) 153(H) 146(H)  Potassium 3.5 - 5.1 mmol/L 3.8 3.5 3.7  Chloride 98 - 111 mmol/L 115(H) 119(H) 110  CO2 22 - 32 mmol/L 23 26 27   Calcium 8.9 - 10.3 mg/dL 9.5 10.2 9.4  Total Protein 6.5 - 8.1 g/dL - 8.4(H) -  Total Bilirubin 0.3 - 1.2 mg/dL - 1.2 -  Alkaline Phos 38 - 126 U/L - 179(H) -  AST 15 - 41 U/L - 32 -  ALT 0 - 44 U/L - 26 -     Imaging studies:   CT Abdomen/Pelvis (12/08/2020) personally reviewed and agree with radiologist report reviewed:  IMPRESSION: 1. Findings with cystitis. Correlation with urinalysis is recommended. 2. Mild to moderate severity bibasilar atelectasis and/or infiltrate. 3.   Sigmoid diverticulosis. 4. Sacral decubitus ulcer, as described above. MRI correlation is recommended, as sequelae associated with acute osteomyelitis cannot be excluded. 5. Aortic atherosclerosis.   Assessment/Plan: (ICD-10's: L2.154) 70 y.o. 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   - I do not think his sacral wound is infected nor the source of his sepsis. I agree with continuing local wound care, frequent repositioning,  and pressure offloading. He does NOT need debridement of this. He may have chronic underlying osteomyelitis and may need to consider MRI pending family wishes and how aggressive they would like to be.    - Continue aggressive IV Abx for sepsis; suspect urinary primary source    - Further management per primary service; we will sign off but be available if needed.   All of the above findings and recommendations were discussed with the medical team.   Thank you for the opportunity to participate in this patient's care.   -- Edison Simon, PA-C [facility] 12/08/2020, 10:30 AM [phone] M-F: 7am - 4pm

## 2020-12-08 NOTE — Consult Note (Signed)
[facility] for Electrolyte Monitoring and Replacement   Recent Labs: Potassium (mmol/L)  Date Value  12/08/2020 3.8   Magnesium (mg/dL)  Date Value  12/08/2020 2.1   Calcium (mg/dL)  Date Value  12/08/2020 9.5   Albumin (g/dL)  Date Value  12/07/2020 3.1 (L)  02/01/2020 4.4   Phosphorus (mg/dL)  Date Value  12/08/2020 4.3   Sodium (mmol/L)  Date Value  12/08/2020 150 (H)  02/01/2020 143   Assessment: Patient is a 70 y/o M presented with sepsis. Pt presented with complaints of recurrent UTI and hematuria. 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 PT has chronic tracheostomy and PEG tube. Planning to start tube feeds 3/19. Pharmacy has been consulted to assist with electrolyte monitoring and replacement as indicated.  MIVF: LR at 150 mL/hr  Goal of Therapy:  Electrolytes within normal limits  Plan:  --Na 150 - continue free water 150 mL per tube q4h --No electrolyte replacement warranted at this time --Follow-up electrolytes with AM labs  Benn Moulder, PharmD Pharmacy Resident  12/08/2020 12:03 PM

## 2020-12-08 NOTE — Procedures (Signed)
PROCEDURE: BRONCHOSCOPY Therapeutic Aspiration of Tracheobronchial Tree  PROCEDURE DATE: 12/08/2020  TIME:  NAMEBrenan Day  DOB:[birth year]  MRN: [id number] LOC:  IC01A/IC01A-AA    HOSP Day: @LENGTHOFSTAYDAYS @ CODE STATUS:      Code Status Orders  (From admission, onward)         Start     Ordered   12/07/20 1919  Full code  Continuous        12/07/20 1919        Code Status History    Date Active Date Inactive Code Status Order ID Comments User Context   08/14/2020 2028 08/25/2020 2332 Full Code [id number]  Bernadette Hoit, DO ED   07/25/2020 2244 08/08/2020 2154 Full Code [id number]  Para Skeans, MD ED   06/23/2020 1817 07/20/2020 1604 Full Code [id number]  Bary Leriche, PA-C Inpatient   06/08/2020 0731 06/23/2020 1640 Full Code [id number]  Esmond Camper Inpatient   Advance Care Planning Activity          Indications/Preliminary Diagnosis:   Consent: (Place X beside choice/s below)  The benefits, risks and possible complications of the procedure were        explained to:  ___ patient  _x__ patient's family  ___ other:___________  who verbalized understanding and gave:  ___ verbal  x___ written  ___ verbal and written  ___ telephone  ___ other:________ consent.      Unable to obtain consent; procedure performed on emergent basis.     Other:       PRESEDATION ASSESSMENT: History and Physical has been performed. Patient meds and allergies have been reviewed. Presedation airway examination has been performed and documented. Baseline vital signs, sedation score, oxygenation status, and cardiac rhythm were reviewed. Patient was deemed to be in satisfactory condition to undergo the procedure.    PREMEDICATIONS:   Sedative/Narcotic Amt Dose   Versed 6 mg   Fentanyl  mcg  Diprivan  mg            PROCEDURE DETAILS: Timeout performed and correct patient, name, & ID confirmed. Following prep per Pulmonary policy, appropriate sedation was administered. The  Bronchoscope was inserted in to oral cavity with bite block in place. Therapeutic aspiration of Tracheobronchial tree was performed.  Airway exam proceeded with findings, technical procedures, and specimen collection as noted below. At the end of exam the scope was withdrawn without incident. Impression and Plan as noted below.           Airway Prep (Place X beside choice below)   1% Transtracheal Lidocaine Anesthetization 7 cc   Patient prepped per Bronchoscopy Lab Policy       Insertion Route (Place X beside choice below)   Nasal   Oral   Endotracheal Tube   Tracheostomy   INTRAPROCEDURE MEDICATIONS:  Sedative/Narcotic Amt Dose   Versed  mg   Fentanyl  mcg  Diprivan  mg       Medication Amt Dose  Medication Amt Dose  Lidocaine 1%  cc  Epinephrine 1:10,000 sol  cc  Xylocaine 4%  cc  Cocaine  cc   TECHNICAL PROCEDURES: (Place X beside choice below)   Procedures  Description    None     Electrocautery     Cryotherapy     Balloon Dilatation     Bronchography     Stent Placement     Therapeutic Aspiration x    Laser/Argon Plasma    Brachytherapy Catheter Placement    Foreign Body Removal  SPECIMENS (Sites): (Place X beside choice below)  Specimens Description   No Specimens Obtained     [place] Bilaterally at worse at bases    Lavage X RLL twice    Biopsies    Fine Needle Aspirates    Brushings    Sputum    FINDINGS:  ESTIMATED BLOOD LOSS: none COMPLICATIONS/RESOLUTION: none      IMPRESSION:POST-PROCEDURE DX: microbiology workp    RECOMMENDATION/PLAN:   Adjust antimicrobials per gram stain and culture    Ottie Glazier, M.D.  Pulmonary & [place]

## 2020-12-08 NOTE — Consult Note (Signed)
NAME: Ryan Day  DOB: Jul 21, 1951  MRN: [id number]  Date/Time: 12/08/2020 10:01 AM  REQUESTING PROVIDER [email or web address] Subjective:  REASON FOR CONSULT: MRSA bacteremia and Enterococcus faecalis bacteremia and Enterobacter bacteremia No history available from patient.  Chart reviewed ?Patient known to me from prior hospitalization Ryan Day is a 70 y.o. 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 In the ED vitals where BP 96/67, temp 98.5, pulse 102, respiratory 20, sats 98% Initial labs WBC 2, Hb 11.1, platelet 213.  Sodium 133, glucose 137, creatinine 1.02, lactate 2.8 which l increased to 4.1. Foley catheter was changed and a new sample of urine was sent for UA and it showed more than 50 WBC and a culture was sent.  Blood culture was also sent.  He was started on vancomycin, cefepime and Flagyl.  He was also given IV fluids. He has been transferred to ICU I am seeing the patient because his blood culture is positive for staph aureus and Enterococcus.   Past Medical History:  Diagnosis Date  . Acute on chronic respiratory failure with hypoxia ([place])   . Acute renal injury due to hypovolemia ([place])   . Autonomic instability   . Cardiac arrest ([place])   . Cervical spinal cord injury, sequela ([place])   . Elevated alkaline phosphatase level   . 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 Hyperlipidemia   . Hypertension      Past Surgical History:  Procedure Laterality Date  . APPLICATION OF WOUND VAC N/A 08/16/2020   Procedure: APPLICATION OF WOUND VAC;  Surgeon: Fredirick Maudlin, MD;  Location: [facility];  Service: General;  Laterality: N/A;  . COLONOSCOPY WITH PROPOFOL N/A 12/05/2017   Procedure: COLONOSCOPY WITH PROPOFOL;  Surgeon: Lin Landsman, MD;  Location: [facility];  Service: Gastroenterology;  Laterality: N/A;  . ESOPHAGOGASTRODUODENOSCOPY  12/05/2017   Procedure: ESOPHAGOGASTRODUODENOSCOPY (EGD);  Surgeon: Lin Landsman, MD;  Location: [place];  Service: Gastroenterology;;  . INCISION AND DRAINAGE ABSCESS N/A 08/16/2020   Procedure: INCISION AND DRAINAGE ABSCESS, SACRAL;  Surgeon: Fredirick Maudlin, MD;  Location: [facility];  Service: General;  Laterality: N/A;    Social History   Socioeconomic History  . Marital status: Married    Spouse name: Not on file  . Number of children: Not on file  . Years of education: Not on file  . Highest education level: Not on file  Occupational History  . Not on file  Tobacco Use  . Smoking status: Never Smoker  . Smokeless tobacco: Never Used  Vaping Use  . Vaping Use: Never used  Substance and Sexual Activity  . Alcohol use: Not Currently  . Drug use: No  . Sexual activity: Not on file  Other Topics Concern  . Not on file  Social History Narrative  . Not on file   Social Determinants of Health   Financial Resource Strain: Not on file  Food Insecurity: Not on file  Transportation Needs: Not on file  Physical Activity: Not on  file  Stress: Not on file  Social Connections: Not on file  Intimate Partner Violence: Not on file    Family History  Problem Relation Age of Onset  . Cancer Father        lung  . Cancer Brother        throat  . Heart disease Brother 54  . Liver cancer Brother   . Alcohol abuse Brother    Allergies  Allergen Reactions  . Shrimp (Diagnostic)     Experiences facial droop when eating seafood    I? Current Facility-Administered Medications  Medication Dose Route Frequency Provider Last Rate Last Admin  . acetaminophen (TYLENOL) 160 MG/5ML solution 320-640 mg  320-640 mg Per Tube Q4H PRN Awilda Bill, NP   640 mg at 12/08/20 0936  . acetylcysteine (MUCOMYST) 20 % nebulizer / oral solution 4 mL  4 mL Nebulization TID Awilda Bill, NP   4 mL at 12/08/20 0753  . albuterol (PROVENTIL) (2.5 MG/3ML) 0.083% nebulizer solution 2.5 mg  2.5 mg Nebulization TID Flora Lipps, MD      . ascorbic acid (VITAMIN C) tablet 500 mg  500 mg Per Tube BID Awilda Bill, NP   500 mg at 12/08/20 6948  . bisacodyl (DULCOLAX) suppository 10 mg  10 mg Rectal QHS Awilda Bill, NP   10 mg at 12/07/20 2213  . ceFEPIme (MAXIPIME) 2 g in sodium chloride 0.9 % 100 mL IVPB  2 g Intravenous Q12H Benn Moulder, RPH      . chlorhexidine gluconate (MEDLINE KIT) (PERIDEX) 0.12 % solution 15 mL  15 mL Mouth Rinse BID Flora Lipps, MD   15 mL at 12/08/20 0814  . Chlorhexidine Gluconate Cloth 2 % PADS 6 each  6 each Topical Daily Flora Lipps, MD   6 each at 12/07/20 2127  . diclofenac Sodium (VOLTAREN) 1 % topical gel 4 g  4 g Topical QID Awilda Bill, NP   4 g at 12/08/20 5462  . docusate sodium (COLACE) capsule 100 mg  100 mg Oral BID PRN Awilda Bill, NP      . famotidine (PEPCID) IVPB 20 mg premix  20 mg Intravenous Q24H Awilda Bill, NP   Stopped at 12/07/20 2234  . ferrous sulfate 220 (44 Fe) MG/5ML solution 75 mg  75 mg Per Tube Daily Flora Lipps, MD   75 mg at 12/08/20 0926  . fludrocortisone (FLORINEF) tablet 200 mcg  200 mcg Per Tube Daily Ottie Glazier, MD   200 mcg at 12/08/20 0947  . FLUoxetine (PROZAC) capsule 40 mg  40 mg Per Tube Daily Awilda Bill, NP   40 mg at 12/08/20 0927  . free water 150 mL  150 mL Per Tube Q4H Awilda Bill, NP   150 mL at 12/08/20 0819  . guaiFENesin (ROBITUSSIN) 100 MG/5ML solution 400 mg  400 mg Per Tube Q6H Awilda Bill, NP   400 mg at  12/08/20 0509  . lactated ringers infusion   Intravenous Continuous Vladimir Crofts, MD 150 mL/hr at 12/08/20 7035 Infusion Verify at 12/08/20 0093  . MEDLINE mouth rinse  15 mL Mouth Rinse 10 times per day Flora Lipps, MD   15 mL at 12/08/20 0938  . midodrine (PROAMATINE) tablet 10 mg  10 mg Per Tube TID WC Awilda Bill, NP   10 mg at 12/08/20 8182  . norepinephrine (LEVOPHED) 16 mg in 273m premix infusion  0-40 mcg/min Intravenous Continuous BMarda Stalker  G, NP 22.5 mL/hr at 12/08/20 0933 24 mcg/min at 12/08/20 0933  . ondansetron (ZOFRAN) injection 4 mg  4 mg Intravenous Q6H PRN Awilda Bill, NP      . polycarbophil (FIBERCON) tablet 625 mg  625 mg Per Tube Daily Awilda Bill, NP   625 mg at 12/08/20 0929  . polyethylene glycol (MIRALAX / GLYCOLAX) packet 17 g  17 g Oral Daily PRN Awilda Bill, NP      . rivaroxaban (XARELTO) tablet 10 mg  10 mg Per Tube Daily Awilda Bill, NP   10 mg at 12/08/20 0926  . saccharomyces boulardii (FLORASTOR) capsule 250 mg  250 mg Per Tube BID Awilda Bill, NP   250 mg at 12/08/20 0926  . sennosides (SENOKOT) 8.8 MG/5ML syrup 5 mL  5 mL Per Tube Q0600 Awilda Bill, NP      . simethicone (MYLICON) 40 KZ/9.9JT suspension 80 mg  80 mg Per Tube QID Awilda Bill, NP   80 mg at 12/07/20 2307  . [START ON 12/09/2020] vancomycin (VANCOREADY) IVPB 1750 mg/350 mL  1,750 mg Intravenous Q24H Benn Moulder, RPH      . vasopressin (PITRESSIN) 20 Units in sodium chloride 0.9 % 100 mL infusion-*FOR SHOCK*  0-0.03 Units/min Intravenous Continuous Awilda Bill, NP 9 mL/hr at 12/08/20 0621 0.03 Units/min at 12/08/20 0621  . zinc sulfate capsule 220 mg  220 mg Per Tube Daily Awilda Bill, NP   220 mg at 12/08/20 7017     Abtx:  Anti-infectives (From admission, onward)   Start     Dose/Rate Route Frequency Ordered Stop   12/09/20 0600  vancomycin (VANCOREADY) IVPB 1750 mg/350 mL        1,750 mg 175 mL/hr over 120 Minutes Intravenous  Every 24 hours 12/08/20 0809     12/08/20 2200  ceFEPIme (MAXIPIME) 2 g in sodium chloride 0.9 % 100 mL IVPB        2 g 200 mL/hr over 30 Minutes Intravenous Every 12 hours 12/08/20 0809     12/08/20 1700  vancomycin (VANCOREADY) IVPB 1500 mg/300 mL  Status:  Discontinued        1,500 mg 150 mL/hr over 120 Minutes Intravenous Every 12 hours 12/08/20 0500 12/08/20 0809   12/08/20 0545  ceFEPIme (MAXIPIME) 2 g in sodium chloride 0.9 % 100 mL IVPB  Status:  Discontinued        2 g 200 mL/hr over 30 Minutes Intravenous Every 8 hours 12/08/20 0446 12/08/20 0809   12/08/20 0530  vancomycin (VANCOREADY) IVPB 2000 mg/400 mL       "Followed by" Linked Group Details   2,000 mg 200 mL/hr over 120 Minutes Intravenous  Once 12/08/20 0441 12/08/20 0815   12/08/20 0530  vancomycin (VANCOREADY) IVPB 500 mg/100 mL  Status:  Discontinued       "Followed by" Linked Group Details   500 mg 100 mL/hr over 60 Minutes Intravenous  Once 12/08/20 0441 12/08/20 0803   12/07/20 2215  piperacillin-tazobactam (ZOSYN) IVPB 3.375 g  Status:  Discontinued        3.375 g 12.5 mL/hr over 240 Minutes Intravenous Every 8 hours 12/07/20 2124 12/08/20 0447   12/07/20 1800  ceFEPIme (MAXIPIME) 2 g in sodium chloride 0.9 % 100 mL IVPB        2 g 200 mL/hr over 30 Minutes Intravenous  Once 12/07/20 1757 12/07/20 2036   12/07/20 1800  vancomycin (VANCOCIN) IVPB 1000 mg/200  mL premix        1,000 mg 200 mL/hr over 60 Minutes Intravenous  Once 12/07/20 1757 12/07/20 1942      REVIEW OF SYSTEMS:  NA due to tracheostomy and weakness: Objective:  VITALS:  BP 94/61   Pulse (!) 109   Temp (!) 100.94 F (38.3 C)   Resp (!) 41   Ht 6' 5.01" (1.956 m)   Wt 114 kg   SpO2 98%   BMI 29.80 kg/m  PHYSICAL EXAM:  General: Awake, lethargic, follows simple commands Head: Normocephalic, without obvious abnormality, atraumatic. Eyes: Conjunctivae clear, anicteric sclerae. Pupils are equal Neck: Tracheostomy on the vent no carotid  bruit and no JVD. Back: Stage IV sacral decubitus Granulation tissue        Lungs: Bilateral air entry Heart: Tachycardia Abdomen: Soft, PEG in place Extremities bilateral heel wounds with eschar     Skin: Edema legs Lymph: Cervical, supraclavicular normal. Neurologic: Quadriplegia Pertinent Labs Lab Results CBC    Component Value Date/Time   WBC 17.6 (H) 12/08/2020 0450   RBC 3.20 (L) 12/08/2020 0450   HGB 9.7 (L) 12/08/2020 0450   HGB 14.7 02/01/2020 0927   HCT 30.5 (L) 12/08/2020 0450   HCT 44.1 02/01/2020 0927   PLT 181 12/08/2020 0450   PLT 267 02/01/2020 0927   MCV 95.3 12/08/2020 0450   MCV 96 02/01/2020 0927   MCH 30.3 12/08/2020 0450   MCHC 31.8 12/08/2020 0450   RDW 16.8 (H) 12/08/2020 0450   RDW 12.3 02/01/2020 0927   LYMPHSABS 0.5 (L) 12/07/2020 1725   LYMPHSABS 1.5 02/01/2020 0927   MONOABS 0.0 (L) 12/07/2020 1725   EOSABS 0.1 12/07/2020 1725   EOSABS 0.1 02/01/2020 0927   BASOSABS 0.0 12/07/2020 1725   BASOSABS 0.0 02/01/2020 0927    CMP Latest Ref Rng & Units 12/08/2020 12/07/2020 08/25/2020  Glucose 70 - 99 mg/dL 110(H) 147(H) 94  BUN 8 - 23 mg/dL 56(H) 52(H) 18  Creatinine 0.61 - 1.24 mg/dL 1.87(H) 1.02 0.63  Sodium 135 - 145 mmol/L 150(H) 153(H) 146(H)  Potassium 3.5 - 5.1 mmol/L 3.8 3.5 3.7  Chloride 98 - 111 mmol/L 115(H) 119(H) 110  CO2 22 - 32 mmol/L '23 26 27  ' Calcium 8.9 - 10.3 mg/dL 9.5 10.2 9.4  Total Protein 6.5 - 8.1 g/dL - 8.4(H) -  Total Bilirubin 0.3 - 1.2 mg/dL - 1.2 -  Alkaline Phos 38 - 126 U/L - 179(H) -  AST 15 - 41 U/L - 32 -  ALT 0 - 44 U/L - 26 -      Microbiology: Recent Results (from the past 240 hour(s))  Microscopic Examination     Status: Abnormal   Collection Time: 12/07/20 10:55 AM   Urine  Result Value Ref Range Status   WBC, UA >30W 0 - 5 /hpf Final   RBC 3-10 (A) 0 - 2 /hpf Final   Epithelial Cells (non renal) None seen 0 - 10 /hpf Final   Crystals Present (A) N/A Final   Crystal Type Calcium  Oxalate N/A Final   Mucus, UA Present (A) Not Estab. Final   Bacteria, UA Many (A) None seen/Few Final  Blood culture (routine x 2)     Status: None (Preliminary result)   Collection Time: 12/07/20  5:25 PM   Specimen: BLOOD  Result Value Ref Range Status   Specimen Description BLOOD BLOOD RIGHT ARM  Final   Special Requests   Final    BOTTLES DRAWN AEROBIC AND  ANAEROBIC Blood Culture adequate volume   Culture  Setup Time   Final    GRAM NEGATIVE RODS GRAM POSITIVE COCCI IN BOTH AEROBIC AND ANAEROBIC BOTTLES Organism ID to follow  ;Violeta Gelinas PHARMD [id number] 12/08/20 HNM CRITICAL RESULT CALLED TO, READ BACK BY AND VERIFIED WITH: Performed at [facility], [address] [place], [place] [address]    Culture Lonell Grandchild NEGATIVE RODS [facility] POSITIVE COCCI   Final   Report Status PENDING  Incomplete  Blood Culture ID Panel (Reflexed)     Status: Abnormal   Collection Time: 12/07/20  5:25 PM  Result Value Ref Range Status   Enterococcus faecalis DETECTED (A) NOT DETECTED Final    Comment: CRITICAL RESULT CALLED TO, READ BACK BY AND VERIFIED WITH: JASON ROBBINS PHARMD [id number] 12/08/20 HNM    Enterococcus Faecium NOT DETECTED NOT DETECTED Final   Listeria monocytogenes NOT DETECTED NOT DETECTED Final   Staphylococcus species DETECTED (A) NOT DETECTED Final    Comment: CRITICAL RESULT CALLED TO, READ BACK BY AND VERIFIED WITHVioleta Gelinas PHARMD [id number] 12/08/20 HNM    Staphylococcus aureus (BCID) DETECTED (A) NOT DETECTED Final    Comment: Methicillin (oxacillin)-resistant Staphylococcus aureus (MRSA). MRSA is predictably resistant to beta-lactam antibiotics (except ceftaroline). Preferred therapy is vancomycin unless clinically contraindicated. Patient requires contact precautions if  hospitalized. CRITICAL RESULT CALLED TO, READ BACK BY AND VERIFIED WITH: Violeta Gelinas PHARMD [id number] 12/08/20 HNM    Staphylococcus epidermidis NOT DETECTED NOT DETECTED Final   Staphylococcus lugdunensis  NOT DETECTED NOT DETECTED Final   Streptococcus species NOT DETECTED NOT DETECTED Final   Streptococcus agalactiae NOT DETECTED NOT DETECTED Final   Streptococcus pneumoniae NOT DETECTED NOT DETECTED Final   Streptococcus pyogenes NOT DETECTED NOT DETECTED Final   A.calcoaceticus-baumannii NOT DETECTED NOT DETECTED Final   Bacteroides fragilis NOT DETECTED NOT DETECTED Final   Enterobacterales DETECTED (A) NOT DETECTED Final    Comment: Enterobacterales represent a large order of gram negative bacteria, not a single organism. CRITICAL RESULT CALLED TO, READ BACK BY AND VERIFIED WITH: Violeta Gelinas PHARMD [id number] 12/08/20 HNM    Enterobacter cloacae complex DETECTED (A) NOT DETECTED Final    Comment: CRITICAL RESULT CALLED TO, READ BACK BY AND VERIFIED WITH: Violeta Gelinas PHARMD [id number] 12/08/20 HNM    Escherichia coli NOT DETECTED NOT DETECTED Final   Klebsiella aerogenes NOT DETECTED NOT DETECTED Final   Klebsiella oxytoca NOT DETECTED NOT DETECTED Final   Klebsiella pneumoniae NOT DETECTED NOT DETECTED Final   Proteus species NOT DETECTED NOT DETECTED Final   Salmonella species NOT DETECTED NOT DETECTED Final   Serratia marcescens NOT DETECTED NOT DETECTED Final   Haemophilus influenzae NOT DETECTED NOT DETECTED Final   Neisseria meningitidis NOT DETECTED NOT DETECTED Final   Pseudomonas aeruginosa NOT DETECTED NOT DETECTED Final   Stenotrophomonas maltophilia NOT DETECTED NOT DETECTED Final   Candida albicans NOT DETECTED NOT DETECTED Final   Candida auris NOT DETECTED NOT DETECTED Final   Candida glabrata NOT DETECTED NOT DETECTED Final   Candida krusei NOT DETECTED NOT DETECTED Final   Candida parapsilosis NOT DETECTED NOT DETECTED Final   Candida tropicalis NOT DETECTED NOT DETECTED Final   Cryptococcus neoformans/gattii NOT DETECTED NOT DETECTED Final   CTX-M ESBL NOT DETECTED NOT DETECTED Final   Carbapenem resistance IMP NOT DETECTED NOT DETECTED Final   Carbapenem resistance KPC  NOT DETECTED NOT DETECTED Final   Meth resistant mecA/C and MREJ DETECTED (A) NOT DETECTED Final    Comment: CRITICAL RESULT  CALLED TO, READ BACK BY AND VERIFIED WITH: Violeta Gelinas PHARMD [id number] 12/08/20 HNM    Carbapenem resistance NDM NOT DETECTED NOT DETECTED Final   Carbapenem resist OXA 48 LIKE NOT DETECTED NOT DETECTED Final   Vancomycin resistance NOT DETECTED NOT DETECTED Final   Carbapenem resistance VIM NOT DETECTED NOT DETECTED Final    Comment: Performed at [facility], [address] [place], [place] [address]  Resp Panel by RT-PCR (Flu A&B, Covid) Nasopharyngeal Swab     Status: None   Collection Time: 12/07/20  7:24 PM   Specimen: Nasopharyngeal Swab; Nasopharyngeal(NP) swabs in vial transport medium  Result Value Ref Range Status   SARS Coronavirus 2 by RT PCR NEGATIVE NEGATIVE Final    Comment: (NOTE) SARS-CoV-2 target nucleic acids are NOT DETECTED.  The SARS-CoV-2 RNA is generally detectable in upper respiratory specimens during the acute phase of infection. The lowest concentration of SARS-CoV-2 viral copies this assay can detect is 138 copies/mL. A negative result does not preclude SARS-Cov-2 infection and should not be used as the sole basis for treatment or other patient management decisions. A negative result may occur with  improper specimen collection/handling, submission of specimen other than nasopharyngeal swab, presence of viral mutation(s) within the areas targeted by this assay, and inadequate number of viral copies(<138 copies/mL). A negative result must be combined with clinical observations, patient history, and epidemiological information. The expected result is Negative.  Fact Sheet for Patients:  EntrepreneurPulse.com.au  Fact Sheet for Healthcare Providers:  IncredibleEmployment.be  This test is no t yet approved or cleared by the [place] FDA and  has been authorized for detection and/or  diagnosis of SARS-CoV-2 by FDA under an Emergency Use Authorization (EUA). This EUA will remain  in effect (meaning this test can be used) for the duration of the COVID-19 declaration under Section 564(b)(1) of the Act, 21 U.S.C.section 360bbb-3(b)(1), unless the authorization is terminated  or revoked sooner.       Influenza A by PCR NEGATIVE NEGATIVE Final   Influenza B by PCR NEGATIVE NEGATIVE Final    Comment: (NOTE) The Xpert Xpress SARS-CoV-2/FLU/RSV plus assay is intended as an aid in the diagnosis of influenza from Nasopharyngeal swab specimens and should not be used as a sole basis for treatment. Nasal washings and aspirates are unacceptable for Xpert Xpress SARS-CoV-2/FLU/RSV testing.  Fact Sheet for Patients: EntrepreneurPulse.com.au  Fact Sheet for Healthcare Providers: IncredibleEmployment.be  This test is not yet approved or cleared by the [place] FDA and has been authorized for detection and/or diagnosis of SARS-CoV-2 by FDA under an Emergency Use Authorization (EUA). This EUA will remain in effect (meaning this test can be used) for the duration of the COVID-19 declaration under Section 564(b)(1) of the Act, 21 U.S.C. section 360bbb-3(b)(1), unless the authorization is terminated or revoked.  Performed at [facility], [address] [place],  [address]   MRSA PCR Screening     Status: Abnormal   Collection Time: 12/07/20  9:08 PM   Specimen: Nasopharyngeal  Result Value Ref Range Status   MRSA by PCR POSITIVE (A) NEGATIVE Final    Comment:        The GeneXpert MRSA Assay (FDA approved for NASAL specimens only), is one component of a comprehensive MRSA colonization surveillance program. It is not intended to diagnose MRSA infection nor to guide or monitor treatment for MRSA infections. RESULT CALLED TO, READ BACK BY AND VERIFIED WITH: [place] [id number] 12/07/20 HNM Performed at [facility], [address] [place], [place] [address]   Culture, blood (Routine X 2) w Reflex to ID Panel     Status: None (Preliminary result)   Collection Time: 12/07/20 11:06 PM   Specimen: BLOOD  Result Value Ref Range Status   Specimen Description BLOOD BLOOD LEFT HAND  Final   Special Requests IN PEDIATRIC BOTTLE Blood Culture adequate volume  Final   Culture   Final    NO GROWTH < 12 HOURS Performed at [facility], [address] [place], [place] [address]    Report Status PENDING  Incomplete    IMAGING RESULTS : CT abdomen Findings were consistent with cystitis Mild to moderate severe bibasilar atelectasis/infiltrate Sigmoid diverticulosis Sacral decubitus ulcer CT head no acute abnormalities   No focal abnormalities I have personally reviewed the films ? Impression/Recommendation ? Polymicrobial bacteremia with sepsis.  MRSA, Enterococcus faecalis and Enterobacter cloacae positive in blood culture..  Continue vancomycin, cefepime. Will need 2D echo.  May need TEE. Source is likely the pressure ulcers. Sacral decubitus.  We will send a culture Has bilateral heel wounds.  Needs podiatry consultation for possible debridement.  AKI likely due to infection / dehydration  Anemia of chronic disease  Hypernatremia needs free water through the PEG  Recently treated with 6 weeks of antibiotics for sacral osteomyelitis and stage IV decubitus in December 21/ January 22  Quadriplegia secondary to mechanical fall and fracture of C5 vertebra with cord compression status post decompressive surgery in August 2021 at [facility]  Tracheostomy  PEG  Discussed the management with the nurse  ID will follow him peripherally this weekend.  Call if needed note:  This document was prepared using Dragon voice recognition software and may include unintentional dictation errors.

## 2020-12-08 NOTE — Progress Notes (Signed)
PHARMACY - PHYSICIAN COMMUNICATION CRITICAL VALUE ALERT - BLOOD CULTURE IDENTIFICATION (BCID)  Ryan Day is an 70 y.o. male who presented to [facility] on 12/07/2020 with a chief complaint of sepsis  Assessment:  MRSA and E Cloacae growing in 2 of 4 bottles ( 1 aerobic and anaerobic)  (include suspected source if known)  Name of physician (or Provider) Contacted: Marda Stalker, NP   Current antibiotics: Zosyn   Changes to prescribed antibiotics recommended:  Will d/c zoysn and start cefepime and vancomycin.   Results for orders placed or performed during the hospital encounter of 12/07/20  Blood Culture ID Panel (Reflexed) (Collected: 12/07/2020  5:25 PM)  Result Value Ref Range   Enterococcus faecalis DETECTED (A) NOT DETECTED   Enterococcus Faecium NOT DETECTED NOT DETECTED   Listeria monocytogenes NOT DETECTED NOT DETECTED   Staphylococcus species DETECTED (A) NOT DETECTED   Staphylococcus aureus (BCID) DETECTED (A) NOT DETECTED   Staphylococcus epidermidis NOT DETECTED NOT DETECTED   Staphylococcus lugdunensis NOT DETECTED NOT DETECTED   Streptococcus species NOT DETECTED NOT DETECTED   Streptococcus agalactiae NOT DETECTED NOT DETECTED   Streptococcus pneumoniae NOT DETECTED NOT DETECTED   Streptococcus pyogenes NOT DETECTED NOT DETECTED   A.calcoaceticus-baumannii NOT DETECTED NOT DETECTED   Bacteroides fragilis NOT DETECTED NOT DETECTED   Enterobacterales DETECTED (A) NOT DETECTED   Enterobacter cloacae complex DETECTED (A) NOT DETECTED   Escherichia coli NOT DETECTED NOT DETECTED   Klebsiella aerogenes NOT DETECTED NOT DETECTED   Klebsiella oxytoca NOT DETECTED NOT DETECTED   Klebsiella pneumoniae NOT DETECTED NOT DETECTED   Proteus species NOT DETECTED NOT DETECTED   Salmonella species NOT DETECTED NOT DETECTED   Serratia marcescens NOT DETECTED NOT DETECTED   Haemophilus influenzae NOT DETECTED NOT DETECTED   Neisseria meningitidis NOT DETECTED NOT DETECTED    Pseudomonas aeruginosa NOT DETECTED NOT DETECTED   Stenotrophomonas maltophilia NOT DETECTED NOT DETECTED   Candida albicans NOT DETECTED NOT DETECTED   Candida auris NOT DETECTED NOT DETECTED   Candida glabrata NOT DETECTED NOT DETECTED   Candida krusei NOT DETECTED NOT DETECTED   Candida parapsilosis NOT DETECTED NOT DETECTED   Candida tropicalis NOT DETECTED NOT DETECTED   Cryptococcus neoformans/gattii NOT DETECTED NOT DETECTED   CTX-M ESBL NOT DETECTED NOT DETECTED   Carbapenem resistance IMP NOT DETECTED NOT DETECTED   Carbapenem resistance KPC NOT DETECTED NOT DETECTED   Meth resistant mecA/C and MREJ DETECTED (A) NOT DETECTED   Carbapenem resistance NDM NOT DETECTED NOT DETECTED   Carbapenem resist OXA 48 LIKE NOT DETECTED NOT DETECTED   Vancomycin resistance NOT DETECTED NOT DETECTED   Carbapenem resistance VIM NOT DETECTED NOT DETECTED    Jordis Repetto D 12/08/2020  4:50 AM

## 2020-12-08 NOTE — TOC Initial Note (Signed)
Transition of Care [facility]) - Progression Note    Patient Details  Name: Ryan Day MRN: [id number] Date of Birth: [birth year]  Transition of Care [facility]) CM/SW [place], [place] Phone Number:  [phone] 12/08/2020, 1:48 PM  Clinical Narrative:     Patient presents to [facility] due to hematuria.  Patient has a fall in August 2021, which resulted in a C4-5 fracture, and is now a quadriplegic. Patient has trach and PEG. CSW spoke with patient's spouse Charleston Hankin [phone] for collateral information.  Ms. Whitesel stated the patient gets 24/7 between her and her daughters, both are RNs.  Ms. Melven Sartorius stated the patient gets to his doctor's appointments via EMS and they are looking at getting a Lucianne Lei.  Ms. Gains stated she is not sure if they have applied for Medicaid, but the patient has BCBS PPO and Medicare part A.  Ms. Critzer stated BCBS PPO told her they would not longer be paying for the hospital bed and would be taking the bed back.  CSW spoke with [employer] and asked to find out if Medicare part A would pay for a hospital bed.  Thedore Mins stated that it would not cover the cost of a hospital bed.   Expected Discharge Plan: [place] Barriers to Discharge: Continued Medical Work up  Expected Discharge Plan and Services Expected Discharge Plan: [place] In-house Referral: Clinical Social Work   Post Acute Care Choice: Durable Medical Equipment Living arrangements for the past 2 months: Single Family Home                                       Social Determinants of Health (SDOH) Interventions    Readmission Risk Interventions No flowsheet data found.

## 2020-12-08 NOTE — Progress Notes (Addendum)
Pharmacy Antibiotic Note  Ryan Day is a 70 y.o. male admitted on 12/07/2020 with sepsis. Pt presented with complaints of recurrent UTI and hematuria with foley cath last placed ~2 weeks ago, exchanged in ED. 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 Pt has PEG and trache. Pt admitted 11/22-12/5 for HCAP and sacral ulcer with osteomyelitis, cx grew enterobacter cloacae. 1/26 and 2/18 UCx with pseudomonas. Pt has received vanc, Unasyn, Zosyn, and cefepime on recent previous admissions. Renal function worsening, Scr 1.02>1.87 (baseline ~ 0.5). Pt loaded with vancomycin 1 g 3/17 @ 1942 and 2 g 3/18 @ 0530. Surgery & ID consulted. Planning for bronchoscopy. Pharmacy has been consulted for Cefepime, Vancomycin dosing.  LA 2.8>4.4>7.3, PCT 0.23> >150, WBC 2>17.6, fevers with Tmax 101.84  Day 1 abx  Plan: --Decrease cefepime to 2 gm IV Q12H --Will dose vanc based on unstable renal function with random levels - random level ordered for 3/19 @ 0500 --Monitor renal function --F/u susceptibilities   Height: 6' 5.01" (195.6 cm) Weight: 114 kg (251 lb 5.2 oz) IBW/kg (Calculated) : 89.12  Temp (24hrs), Avg:101.2 F (38.4 C), Min:97.1 F (36.2 C), Max:101.84 F (38.8 C)  Recent Labs  Lab 12/07/20 1725 12/07/20 1933 12/07/20 2120 12/08/20 0030 12/08/20 0450  WBC 2.0*  --   --   --  17.6*  CREATININE 1.02  --   --   --  1.87*  LATICACIDVEN 2.8* 4.1* 4.0* 4.4*  --     Estimated Creatinine Clearance: 53 mL/min (A) (by C-G formula based on SCr of 1.87 mg/dL (H)).    No Known Allergies  Antimicrobials this admission: 3/18 vancomycin  >>  3/18 cefepime  >>   Dose adjustments this admission: -Vancomycin 1500mg  q12h > 1750mg  q24h -Cefepime 2g q8h > 2g q12h  Microbiology results: 3/17 BCx: e. Cloacae, MRSA, e. faecalis  3/17 UCx: pending 3/18 Sputum: pending 3/17 MRSA PCR: positive  Thank you for allowing pharmacy to be a part of this patient's  care.  Benn Moulder, PharmD Pharmacy Resident  12/08/2020 8:23 AM

## 2020-12-08 NOTE — Progress Notes (Signed)
Pt admitted to ICU from ED overnight. Prior to admission, CT of abdomin and head completed, see results. 2nd set of blood cx's collected in ICU. Resp cx collected this AM. Pt arrived to ICU on Levo and Vaso, gtt's titrated to maintain MAP > 65. Upon arrival pt had soft medium BM. Foley in place, diminished urine output over course of night, Hinton Dyer NP notified and aware.   2L LR bolus infused in ICU. Zosyn switched to cefepime + vanc this AM.\\  Pt nodding appropriately to questions and mouthing words.   Unstageable wounds present on sacrum and b/l heels. New dressings placed on all three wounds. Wound consult placed.   Pt in no acute distress @this  time. Will continue to monitor.

## 2020-12-08 NOTE — Telephone Encounter (Signed)
Copied from [place] [phone]. Topic: General - Other >> Dec 08, 2020 10:13 AM Yvette Rack wrote: Reason for CRM: [facility] states the directions and quantity for Rx for ciprofloxacin (CIPRO) 500 MG tablet doe not match. Cb# [phone]

## 2020-12-08 NOTE — Progress Notes (Signed)
NAME:  Ryan Day, MRN:  [id number], DOB:  [birth year], LOS: 1 ADMISSION DATE:  12/07/2020, CONSULTATION DATE: 12/07/2020 REFERRING MD: Dr. Tamala Julian, CHIEF COMPLAINT: Hematuria   History of Present Illness:  This is a 70 yo quadriplegic male with a chronic tracheostomy who presented to [facility] ER on 03/17 with hematuria.  According to pts family last week pt noted to have sediment in his urine at home concerning for UTI.  He had a foley catheter placed 2 weeks ago and completed a 7 day course of abx therapy due to UTI.  Due to concerns of recurrent UTI pts family contacted pts PCP office last week, and heard from his PCP this week and were awaiting urine culture results.  However, pt noted to have hematuria yesterday and today along with sinus tachycardia hr 120's.  Pts daughter is a Marine scientist and due to concern of sepsis pt transported to the ER via EMS for further evaluation and treatment.    ED course Upon arrival to the ER pts O2 sats were 75-80% on RA, therefore pt placed on NRB with O2 sats initially increasing to 100%.  Pt transitioned to trach collar with initial O2 sats in the low 90's.  However, pt noted to have mucous plugging resulting in hypoxia requiring exchange of tracheostomy from a cuffless trach to a size 6 mm cuffed trach and pt placed on mechanical ventilation.  ER vital signs were: temp 101.7 rectally, sbp 60-80's, and hr 130's. Lab results revealed Na+ 153, chloride 119, glucose 147, lactic acid 2.8, wbc 2.0, hbg 11.1, and UA positive for UTI.  CXR negative and COVID-19/Influenza PCR results pending.  Sepsis protocol initiated and pt received cefepime, vancomycin, and 2L LR bolus.  Pt remained severely hypotensive requiring levophed gtt and right femoral central line placement.  PCCM team contacted for ICU admission.    12/08/20- patient is improved some he is communicative.  Wife [name] at bedside we reviewed care plan.   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 C5 Cervical Fracture (following a mechanical fall at home) s/p C3-6 Laminectomy and Posterior Instrumentation and Fusion 08/4  Quadriplegia  Spinal Cord Compression  Chronic Tracheostomy and PEG    Significant Hospital Events: Including procedures, antibiotic start and stop dates in addition to other pertinent events   03/17: Pt admitted to ICU with urosepsis requiring vasopressors and acute on chronic hypoxic respiratory failure secondary to mucous plugging requiring exchange of cuffless trach to size 6 mm cuffed trach to be placed on mechanical ventilation  03/17: Right femoral central line placed by ER physician  03/17: CT Abd Pelvis findings with cystitis. Correlation with urinalysis is Recommended. Mild to moderate severity bibasilar atelectasis and/or Infiltrate. Sigmoid diverticulosis. Sacral decubitus ulcer, as described above. MRI correlation is recommended, as sequelae associated with acute osteomyelitis cannot be excluded. Aortic atherosclerosis. 03/17: CT Head revealed mild chronic ischemic white matter disease. No acute intracranial abnormality seen 03/17: Vancomycin x1 dose and Cefepime x1 dose 03/17: Zosyn>>  Interim History / Subjective:  Pt lethargic no signs of respiratory distress mechanically intubated via tracheostomy   Objective   Blood pressure 94/61, pulse (!) 109, temperature (!) 100.94 F (38.3 C), resp. rate (!) 41, height 6' 5.01" (1.956 m), weight 114 kg, SpO2 98 %.    Vent Mode: PRVC FiO2 (%):  [35 %-100 %] 35 % Set Rate:  [20 bmp-24 bmp] 24 bmp Vt Set:  [500 mL] 500 mL PEEP:  [  8 cmH20] 8 cmH20   Intake/Output Summary (Last 24 hours) at 12/08/2020 0076 Last data filed at 12/08/2020 2263 Gross per 24 hour  Intake 2093.43 ml  Output 860 ml  Net 1233.43 ml   Filed Weights   12/07/20 1652 12/07/20 2050  Weight: 108.9 kg 114 kg   Examination: General: chronically appearing male, NAD receiving mechanical  ventilation via trach  HENT: supple, no JVD  Lungs: diminished throughout, even, non labored  Cardiovascular: sinus tach, no R/G, 2+ radial/1+ distal pulses, 2+ bilateral lower extremity edema  Abdomen: hypoactive BS x, soft, non distended  Extremities: quadriplegic Neuro: lethargic, PERRL  Skin: sacral spine decubitus  GU: chronic foley in place   Labs/imaging personally reviewed   CT Abd/Pelvis CXR CT Head  UA  Resolved Hospital Problem list   N/A  Assessment & Plan:   Acute on chronic hypoxic respiratory failure secondary to mucous plugging which is chronic  Mechanical ventilation via chronic tracheostomy  Full vent support for now-vent settings reviewed and established SBT once all parameters met VAP bundle implemented  Aggressive pulmonary hygiene    Hypotension secondary to urosepsis  Continuous telemetry monitoring  Aggressive fluid resuscitation and prn levophed/vasopressin gtts to maintain map >65  Mild acute renal failure secondary to ATN Severe lactic acidosis  Trend BMP and lactic acid   Replace electrolytes as indicated  Monitor UOP Avoid nephrotoxic medications   Urosepsis secondary to chronic foley  Leukopenia  Trend WBC and monitor fever curve  Trend PCT  Follow cultures  Continue zosyn  Foley catheter exchanged in the ED  Anemia without obvious acute blood loss Trend CBC  Monitor for s/sx of bleeding Transfuse for hgb <7  Sacral spine pressure ulcer-pt previously had wound vac in place with removal  Quadriplegia-bed bound Turn q2hrs  Wound Care consulted appreciate input   Acute toxic encephalopathy  Frequent reorientation  Will attempt to avoid sedating medications   Best practice (evaluated daily)  Diet:  NPO Pain/Anxiety/Delirium protocol (if indicated): Yes (RASS goal 0) VAP protocol (if indicated): Yes DVT prophylaxis: Systemic AC GI prophylaxis: H2B Glucose control:  SSI No Central venous access:  Yes, and it is still  needed Arterial line:  N/A Foley:  Yes, and it is still needed Mobility:  bed rest  PT consulted: N/A Last date of multidisciplinary goals of care discussion [N/A] Code Status:  full code Disposition: ICU   Labs   CBC: Recent Labs  Lab 12/07/20 1725 12/08/20 0450  WBC 2.0* 17.6*  NEUTROABS 1.3*  --   HGB 11.1* 9.7*  HCT 36.1* 30.5*  MCV 95.3 95.3  PLT 213 335    Basic Metabolic Panel: Recent Labs  Lab 12/07/20 1725 12/08/20 0450  NA 153* 150*  K 3.5 3.8  CL 119* 115*  CO2 26 23  GLUCOSE 147* 110*  BUN 52* 56*  CREATININE 1.02 1.87*  CALCIUM 10.2 9.5  MG  --  2.1  PHOS  --  4.3   GFR: Estimated Creatinine Clearance: 53 mL/min (A) (by C-G formula based on SCr of 1.87 mg/dL (H)). Recent Labs  Lab 12/07/20 1725 12/07/20 1933 12/07/20 2120 12/08/20 0030 12/08/20 0450  PROCALCITON 0.23  --   --   --  >150.00  WBC 2.0*  --   --   --  17.6*  LATICACIDVEN 2.8* 4.1* 4.0* 4.4*  --     Liver Function Tests: Recent Labs  Lab 12/07/20 1725  AST 32  ALT 26  ALKPHOS 179*  BILITOT  1.2  PROT 8.4*  ALBUMIN 3.1*   No results for input(s): LIPASE, AMYLASE in the last 168 hours. No results for input(s): AMMONIA in the last 168 hours.  ABG    Component Value Date/Time   PHART 7.38 12/08/2020 0500   PCO2ART 42 12/08/2020 0500   PO2ART 115 (H) 12/08/2020 0500   HCO3 24.8 12/08/2020 0500   ACIDBASEDEF 0.4 12/08/2020 0500   O2SAT 98.4 12/08/2020 0500     Coagulation Profile: No results for input(s): INR, PROTIME in the last 168 hours.  Cardiac Enzymes: No results for input(s): CKTOTAL, CKMB, CKMBINDEX, TROPONINI in the last 168 hours.  HbA1C: Hgb A1c MFr Bld  Date/Time Value Ref Range Status  06/09/2020 07:00 AM 5.8 (H) 4.8 - 5.6 % Final    Comment:    (NOTE) Pre diabetes:          5.7%-6.4%  Diabetes:              >6.4%  Glycemic control for   <7.0% adults with diabetes     CBG: Recent Labs  Lab 12/07/20 2108 12/07/20 2334 12/08/20 0324  12/08/20 0719  GLUCAP 130* 130* 88 105*    Review of Systems:   Unable to assess pt chronically trached currently requiring mechanical ventilation   Past Medical History:  He,  has a past medical history of Acute on chronic respiratory failure with hypoxia ([place]), Acute renal injury due to hypovolemia [facility]), Autonomic instability, Cardiac arrest ([place]), Cervical spinal cord injury, sequela ([place]), Elevated alkaline phosphatase level, 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   Surgical History:   Past Surgical History:  Procedure Laterality Date  . APPLICATION OF WOUND VAC N/A 08/16/2020   Procedure: APPLICATION OF WOUND VAC;  Surgeon: Fredirick Maudlin, MD;  Location: [facility] ORS;  Service: General;  Laterality: N/A;  . COLONOSCOPY WITH PROPOFOL N/A 12/05/2017   Procedure: COLONOSCOPY WITH PROPOFOL;  Surgeon: Lin Landsman, MD;  Location: [facility] ENDOSCOPY;  Service: Gastroenterology;  Laterality: N/A;  . ESOPHAGOGASTRODUODENOSCOPY  12/05/2017   Procedure: ESOPHAGOGASTRODUODENOSCOPY (EGD);  Surgeon: Lin Landsman, MD;  Location: [place];  Service: Gastroenterology;;  . INCISION AND DRAINAGE ABSCESS N/A 08/16/2020   Procedure: INCISION AND DRAINAGE ABSCESS, SACRAL;  Surgeon: Fredirick Maudlin, MD;  Location: [facility] ORS;  Service: General;  Laterality: N/A;     Social History:   reports that he has never smoked. He has never used smokeless tobacco. He reports previous alcohol use. He reports that he does not use drugs.   Family History:  His family history includes Alcohol abuse in his brother; Cancer in his brother and father; Heart disease (age of onset: 31) in his brother; Liver cancer in his brother.   Allergies No Known Allergies   Home Medications  Prior to Admission medications   Medication Sig Start Date End Date Taking? Authorizing Provider  acetaminophen (TYLENOL) 160 MG/5ML solution Place 10-20 mLs (320-640 mg total) into  feeding tube every 4 (four) hours as needed for mild pain. 07/18/20   Love, Ivan Anchors, PA-C  ascorbic acid (VITAMIN C) 500 MG tablet Place 1 tablet (500 mg total) into feeding tube 2 (two) times daily. 07/18/20   Love, Ivan Anchors, PA-C  baclofen (LIORESAL) 10 MG tablet Take 1 tablet (10 mg total) by mouth 3 (three) times daily. 11/12/20   Johnson, Megan P, DO  bisacodyl (DULCOLAX) 10 MG suppository Place 1 suppository (10 mg total) rectally at bedtime. 07/20/20   Bary Leriche, PA-C  chlorhexidine (PERIDEX) 0.12 % solution 15 mLs by Mouth Rinse route 2 (two) times daily. 07/20/20   Love, Ivan Anchors, PA-C  collagenase (SANTYL) ointment Apply topically daily. Patient not taking: Reported on 11/07/2020 07/20/20   Love, Ivan Anchors, PA-C  diclofenac Sodium (VOLTAREN) 1 % GEL Apply 4 g topically 4 (four) times daily. 08/31/20   Kathrine Haddock, NP  famotidine (PEPCID) 20 MG tablet Place 1 tablet (20 mg total) into feeding tube daily. 07/20/20   Love, Ivan Anchors, PA-C  ferrous sulfate 300 (60 Fe) MG/5ML syrup Place 1.3 mLs (78 mg total) into feeding tube daily. 07/20/20   Love, Ivan Anchors, PA-C  fludrocortisone (FLORINEF) 0.1 MG tablet Take 200 mcg by mouth daily. 11/02/20   [provider]  FLUoxetine (PROZAC) 20 MG/5ML solution Place 10 mLs (40 mg total) into feeding tube daily. 11/22/20   Vigg, Avanti, MD  guaiFENesin 200 MG tablet Place 2 tablets (400 mg total) into feeding tube every 6 (six) hours. 07/20/20   Love, Ivan Anchors, PA-C  lidocaine (LIDODERM) 5 % Place 1 patch onto the skin daily. Remove & Discard patch within 12 hours or as directed by MD 07/20/20   Love, Ivan Anchors, PA-C  midodrine (PROAMATINE) 10 MG tablet Place 1 tablet (10 mg total) into feeding tube 3 (three) times daily with meals. 11/04/20   Park Liter P, DO  Mouthwashes (MOUTH RINSE) LIQD solution 15 mLs by Mouth Rinse route 2 times daily at 12 noon and 4 pm. 07/20/20   Love, Ivan Anchors, PA-C  Multiple Vitamin (MULTIVITAMIN) LIQD Place 5 mLs  into feeding tube daily. 07/20/20   Love, Ivan Anchors, PA-C  nutrition supplement, JUVEN, (JUVEN) PACK Place 1 packet into feeding tube 2 (two) times daily between meals. 07/20/20   Love, Ivan Anchors, PA-C  Nutritional Supplements (FEEDING SUPPLEMENT, OSMOLITE 1.5 CAL,) LIQD Place 474 mLs into feeding tube 4 (four) times daily. 07/20/20   Love, Ivan Anchors, PA-C  Nutritional Supplements (FEEDING SUPPLEMENT, PROSOURCE TF,) liquid Place 45 mLs into feeding tube 2 (two) times daily. 07/20/20   Love, Ivan Anchors, PA-C  ondansetron (ZOFRAN) 4 MG tablet Place 1 tablet (4 mg total) into feeding tube every 8 (eight) hours as needed for nausea, vomiting or refractory nausea / vomiting. 07/20/20   Love, Ivan Anchors, PA-C  polycarbophil (FIBERCON) 625 MG tablet Place 1 tablet (625 mg total) into feeding tube daily. 07/20/20   Love, Ivan Anchors, PA-C  rivaroxaban (XARELTO) 10 MG TABS tablet Take one tablet per tube daily 08/08/20   [name], MD  saccharomyces boulardii (FLORASTOR) 250 MG capsule Place 1 capsule (250 mg total) into feeding tube 2 (two) times daily. 07/20/20   Love, Ivan Anchors, PA-C  sennosides (SENOKOT) 8.8 MG/5ML syrup Place 5 mLs into feeding tube daily at 6 (six) AM. 07/20/20   Love, Ivan Anchors, PA-C  simethicone (MYLICON) 40 JG/8.1LX drops Place 1.2 mLs (80 mg total) into feeding tube 4 (four) times daily. 07/20/20   Love, Ivan Anchors, PA-C  traZODone (DESYREL) 50 MG tablet Place 1 tablet (50 mg total) into feeding tube at bedtime. 10/23/20   Jon Billings, NP  Water For Irrigation, Sterile (FREE WATER) SOLN Place 400 mLs into feeding tube every 4 (four) hours. Patient taking differently: Place 300 mLs into feeding tube every 4 (four) hours. 07/20/20   Love, Ivan Anchors, PA-C  zinc sulfate 220 (50 Zn) MG capsule Place 1 capsule (220 mg total) into feeding tube daily. 07/20/20   Love, Ivan Anchors,  PA-C         Critical care provider statement:    Critical care time (minutes):  33   Critical care time was  exclusive of:  Separately billable procedures and  treating other patients   Critical care was necessary to treat or prevent imminent or  life-threatening deterioration of the following conditions:  mucu plugging, acute respiratory distress with hypoxemia, possible sepsis   Critical care was time spent personally by me on the following  activities:  Development of treatment plan with patient or surrogate,  discussions with consultants, evaluation of patient's response to  treatment, examination of patient, obtaining history from patient or  surrogate, ordering and performing treatments and interventions, ordering  and review of laboratory studies and re-evaluation of patient's condition   I assumed direction of critical care for this patient from another  provider in my specialty: no         Ottie Glazier, M.D.  Pulmonary & [place]

## 2020-12-08 NOTE — Progress Notes (Signed)
A bedside bronchoscopy was performed by [name] and Frederich Cha RRT, RCP.  A disposable, slim, [employer] scope was used. Time out was performed at 1436. Bronchoscopy was started at 1438 via size 6 trach and ended at 1446.  A BAL specimen was collected and sent to the lab.

## 2020-12-08 NOTE — Consult Note (Signed)
WOC Nurse Consult Note: Reason for Consult:Stage 4 pressure injury to sacrum, right gluteal/hip and bilateral lower legs.  Seen at wound care center.  Last seen 12/06/20.  CT pelvis could not rule out acute osteomyelitis of sacrum.  Will implement topical orders at this time.  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 Wound VAC was stopped at wound care center.  Will continue to Aquacel silver hydrofiber recommended there.  Pressure Injury POA: Yes  Wounds began after surgery at [facility].  Subsequent hospitalizations at [facility] for debridement to sacrum.  Measurement:Right gluteal, extends to hip:  1 cm x 1 cm x 0.2 cm  Sacrum: 2.5 cm x 2 cm x 1.5 cm  Right heel:  2.7 cm x 2.4 cm x 0.1 cm  Left heel:  4.5 cm x 5.6 cm x 0.1 cm  Wound bed:red and moist  No bone visible Drainage (amount, consistency, odor) moderate serosanguinous   Periwound:intact Dressing procedure/placement/frequency:Cleanse wounds to sacrum/Right gluteal and hip and bilateral heels with NS and pat gently dry.  Apply aquacel Ag to wound bed.  Cover with dry gauze and ABD pad/tape or kerlix and tape (heels) .  Change Monday/Wednesday/Friday and PRN soilage.  Needs low air loss mattress if transferred to medical floor (ICU beds have low air loss feature) Prevalon boots to bilateral heels.  Turn and reposition every two hours.  Will not follow at this time.  Please re-consult if needed.  Domenic Moras MSN, RN, FNP-BC CWON Wound, Ostomy, Continence Nurse Pager [phone]

## 2020-12-08 NOTE — Progress Notes (Signed)
Pharmacy Antibiotic Note  Ryan Day is a 70 y.o. male admitted on 12/07/2020 with sepsis.  Pharmacy has been consulted for Cefepime, Vancomycin  dosing.  Plan: Cefepime 2 gm IV Q8H ordered to start on 3/18 @ 0600.   Vancomycin 2 gm IV X 1 followed by vancomycin 500 mg IV X 1 to make total loading dose of 2500 mg ordered for 3/18 @ 0600.  Vancomycin 1500 mg IV Q12H ordered to continue on 3/18 @ 1800.   AUC = 475.3 vanc trough = 13.5   Height: 6' 5.01" (195.6 cm) Weight: 114 kg (251 lb 5.2 oz) IBW/kg (Calculated) : 89.12  Temp (24hrs), Avg:101.2 F (38.4 C), Min:97.1 F (36.2 C), Max:101.84 F (38.8 C)  Recent Labs  Lab 12/07/20 1725 12/07/20 1933 12/07/20 2120 12/08/20 0030  WBC 2.0*  --   --   --   CREATININE 1.02  --   --   --   LATICACIDVEN 2.8* 4.1* 4.0* 4.4*    Estimated Creatinine Clearance: 97.2 mL/min (by C-G formula based on SCr of 1.02 mg/dL).    No Known Allergies  Antimicrobials this admission:   >>    >>   Dose adjustments this admission:   Microbiology results:  BCx:   UCx:    Sputum:    MRSA PCR:   Thank you for allowing pharmacy to be a part of this patient's care.  Quinlan Mcfall D 12/08/2020 5:01 AM

## 2020-12-08 NOTE — Telephone Encounter (Signed)
Routing to provider. RX was written for 20 tablets but states to only take for 7 days. Please clarify prescription and resend to the pharmacy.

## 2020-12-08 NOTE — Progress Notes (Signed)
Initial Nutrition Assessment  DOCUMENTATION CODES:   Not applicable  INTERVENTION:   Once pt is stable for tube feeds, recommend:  Pivot 1.5 @70ml /hr- Initiate at 5ml/hr and increase by 94ml/hr q 8 hours until goal rate is reached.   Pro-Source 76ml daily via tube, provides 40kcal and 11g of protein per serving   Free water flushes 261ml q4 hrs  Regimen provides 2560kcal/day, 168g/day protein and 2459ml/day free water   Juven Fruit Punch BID via tube, each serving provides 95kcal and 2.5g of protein (amino acids glutamine and arginine)  NUTRITION DIAGNOSIS:   Inadequate oral intake related to dysphagia as evidenced by NPO status (pt with chronic PEG).  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 PEG tube in place. Pt is familiar with nutrition department from numerous previous admits. Spoke with pt's wife and daughter via phone. Pt's home tube feed regimen consists of Osmolite 1.5 Cal 2 cartons (474 mL) 4 times daily (8am, 12pm, 4pm & 8pm) + Prostat 50ml BID per tube; pt also receives 66ml water flush with each feed. Tube feed regimen provides 3040 kcal, 149 grams of protein, 3848 mL H2O daily. Pt also receives Juven twice daily. Pt does not take food by mouth. Per chart, pt lost down to 231lbs initially after his fall but pt has now regained back to his UBW of 251lbs. Plan is to hold tube feeds for today as pt on multiple pressors. Will plan to restart an elemental formula once pt is stable and ready for feeds.   Enteral Access: 20 Fr. PEG-tube placed 05/04/20  Medications reviewed and include: vitamin C, dulcolax, ferrous  sulfate, florastor, senokot, zinc, cefepime, pepcid, levophed, vasopressin   Labs reviewed: Na 150(H), BUN 56(H), creat 1.87(H), P 4.3 wnl, Mg 2.1 wnl Wbc- 2.0(L), Hgb 11.1(L), Hct 36.1(L) cbgs- 88, 105 x 24 hrs  Patient is currently intubated on ventilator support MV: 13.0 L/min Temp (24hrs), Avg:101.2 F (38.4 C), Min:97.1 F (36.2 C), Max:101.84 F (38.8 C)  Propofol: none   MAP- 62-61mmHg   UOP- 86ml   NUTRITION - FOCUSED PHYSICAL EXAM:  Flowsheet Row Most Recent Value  Orbital Region No depletion  Upper Arm Region No depletion  Thoracic and Lumbar Region No depletion  Buccal Region No depletion  Temple Region No depletion  Clavicle Bone Region No depletion  Clavicle and Acromion Bone Region No depletion  Scapular Bone Region No depletion  Dorsal Hand No depletion  Patellar Region No depletion  Anterior Thigh Region No depletion  Posterior Calf Region Mild depletion  Edema (RD Assessment) Mild  Hair Reviewed  Eyes Reviewed  Mouth Reviewed  Skin Reviewed  Nails Reviewed     Diet Order:   Diet Order            Diet NPO time specified  Diet effective now                EDUCATION NEEDS:   No education needs have been identified at this time  Skin:  Skin Assessment: Reviewed RN Assessment (Right gluteal, extends to hip: 1 cm x 1 cm x 0.2 cm, Sacrum: 2.5 cm x 2 cm x 1.5 cm, Right  heel:  2.7 cm x 2.4 cm x 0.1 cm, Left heel: 4.5 cm x 5.6 cm x 0.1 cm)  Last BM:  3/17  Height:   Ht Readings from Last 1 Encounters:  12/08/20 6' 5.01" (1.956 m)    Weight:   Wt Readings from Last 1 Encounters:  12/07/20 114 kg    Ideal Body Weight:  94.5 kg  BMI:  Body mass index is 29.8 kg/m.  Estimated Nutritional Needs:   Kcal:  2620kcal/day  Protein:  >160g/day  Fluid:  2.6-2.9L/day  Koleen Distance MS, RD, LDN Please refer to [facility] for RD and/or RD on-call/weekend/after hours pager

## 2020-12-09 ENCOUNTER — Inpatient Hospital Stay: Payer: Medicare Other

## 2020-12-09 ENCOUNTER — Inpatient Hospital Stay (HOSPITAL_COMMUNITY)
Admit: 2020-12-09 | Discharge: 2020-12-09 | Disposition: A | Discharge status: Home / Self Care | Payer: Medicare Other | Attending: Infectious Diseases | Admitting: Infectious Diseases

## 2020-12-09 DIAGNOSIS — R7881 Bacteremia: Secondary | ICD-10-CM

## 2020-12-09 DIAGNOSIS — Z930 Tracheostomy status: Secondary | ICD-10-CM

## 2020-12-09 LAB — ECHOCARDIOGRAM COMPLETE
AR max vel: 2.65 cm2
AV Peak grad: 11.6 mmHg
Ao pk vel: 1.7 m/s
Area-P 1/2: 5.09 cm2
Height: 77.008 in
S' Lateral: 3.59 cm
Weight: 4416.25 oz

## 2020-12-09 LAB — GLUCOSE, CAPILLARY
Glucose-Capillary: 108 mg/dL — ABNORMAL HIGH (ref 70–99)
Glucose-Capillary: 112 mg/dL — ABNORMAL HIGH (ref 70–99)
Glucose-Capillary: 112 mg/dL — ABNORMAL HIGH (ref 70–99)
Glucose-Capillary: 85 mg/dL (ref 70–99)
Glucose-Capillary: 93 mg/dL (ref 70–99)
Glucose-Capillary: 95 mg/dL (ref 70–99)

## 2020-12-09 LAB — CBC
HCT: 26.3 % — ABNORMAL LOW (ref 39.0–52.0)
Hemoglobin: 8.2 g/dL — ABNORMAL LOW (ref 13.0–17.0)
MCH: 29.9 pg (ref 26.0–34.0)
MCHC: 31.2 g/dL (ref 30.0–36.0)
MCV: 96 fL (ref 80.0–100.0)
Platelets: 133 10*3/uL — ABNORMAL LOW (ref 150–400)
RBC: 2.74 MIL/uL — ABNORMAL LOW (ref 4.22–5.81)
RDW: 17.1 % — ABNORMAL HIGH (ref 11.5–15.5)
WBC: 15.2 10*3/uL — ABNORMAL HIGH (ref 4.0–10.5)
nRBC: 0.1 % (ref 0.0–0.2)

## 2020-12-09 LAB — BASIC METABOLIC PANEL
Anion gap: 7 (ref 5–15)
BUN: 54 mg/dL — ABNORMAL HIGH (ref 8–23)
CO2: 24 mmol/L (ref 22–32)
Calcium: 9 mg/dL (ref 8.9–10.3)
Chloride: 116 mmol/L — ABNORMAL HIGH (ref 98–111)
Creatinine, Ser: 1.66 mg/dL — ABNORMAL HIGH (ref 0.61–1.24)
GFR, Estimated: 45 mL/min — ABNORMAL LOW (ref >60)
Glucose, Bld: 114 mg/dL — ABNORMAL HIGH (ref 70–99)
Potassium: 3.1 mmol/L — ABNORMAL LOW (ref 3.5–5.1)
Sodium: 147 mmol/L — ABNORMAL HIGH (ref 135–145)

## 2020-12-09 LAB — LACTIC ACID, PLASMA
Lactic Acid, Venous: 2.3 mmol/L (ref 0.5–1.9)
Lactic Acid, Venous: 3.3 mmol/L (ref 0.5–1.9)

## 2020-12-09 LAB — POTASSIUM: Potassium: 3.5 mmol/L (ref 3.5–5.1)

## 2020-12-09 LAB — SODIUM: Sodium: 153 mmol/L — ABNORMAL HIGH (ref 135–145)

## 2020-12-09 LAB — PROCALCITONIN: Procalcitonin: >150 ng/mL

## 2020-12-09 LAB — PHOSPHORUS: Phosphorus: 4.1 mg/dL (ref 2.5–4.6)

## 2020-12-09 LAB — MAGNESIUM: Magnesium: 2.2 mg/dL (ref 1.7–2.4)

## 2020-12-09 LAB — VANCOMYCIN, RANDOM: Vancomycin Rm: 20

## 2020-12-09 IMAGING — DX DG CHEST 1V PORT
1 series · 1 of 1 positions shown · non-contrast
Comparison: [DATE]

CLINICAL DATA: Acute respiratory failure

EXAM:
PORTABLE CHEST 1 VIEW

[chest ap]
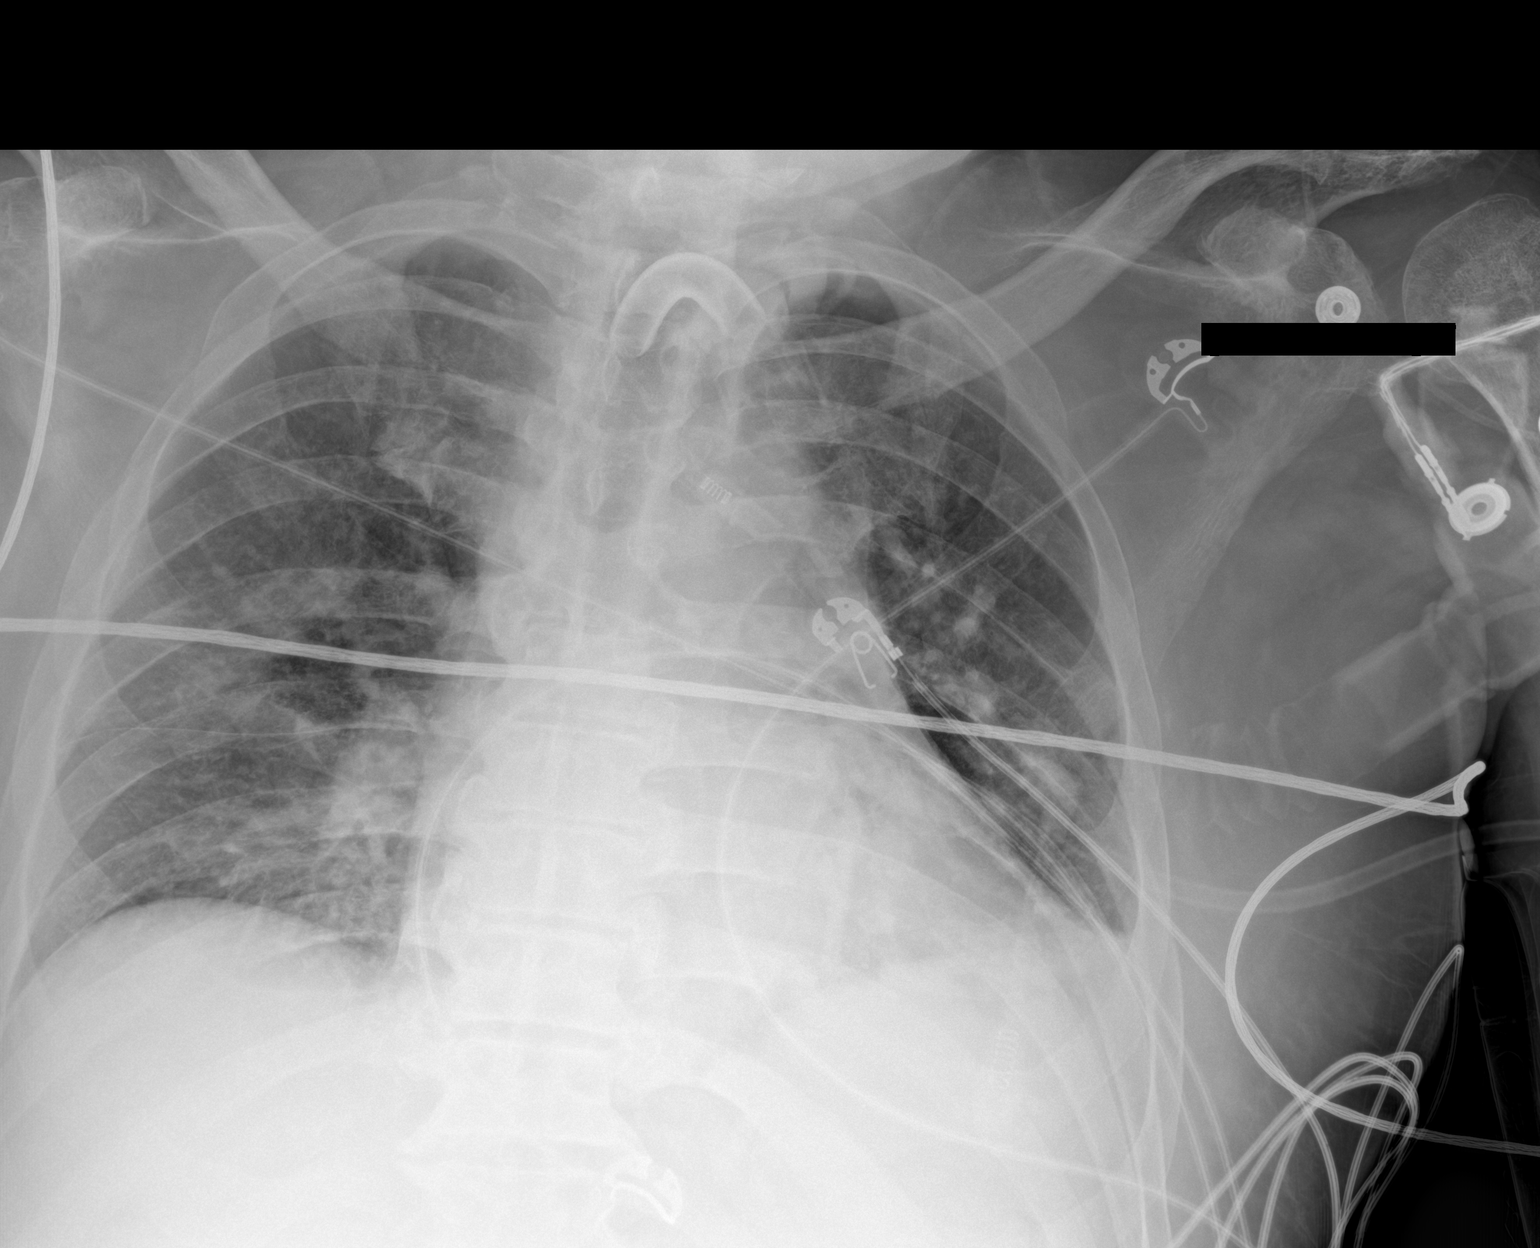

[1 of 1 positions shown; findings below may reference images not displayed]

FINDINGS: Tracheostomy tube tip terminates in the mid to upper trachea in
similar positioning to prior. Telemetry leads and external support
devices overlie the chest. Prior cervical fusion, incompletely
assessed on this exam.

There are increasing heterogeneous opacities throughout both lungs
with associated airways thickening. No visible pneumothorax or
layering effusion is seen. Cardiomediastinal contours are unchanged
from prior counting for differences in technique. No acute osseous
or soft tissue abnormality. Degenerative changes are present in the
imaged spine and shoulders.
IMPRESSION: Increasing heterogeneous opacities throughout both lungs with
associated airways thickening, could reflect developing infection,
edema, atelectasis or some combination there of.

## 2020-12-09 MED ORDER — SODIUM CHLORIDE 0.45 % IV SOLN: INTRAVENOUS | Status: DC

## 2020-12-09 MED ORDER — POTASSIUM CHLORIDE 10 MEQ/50ML IV SOLN
10.0000 meq | INTRAVENOUS | Status: AC
  Administered 2020-12-09 (×2): 10 meq via INTRAVENOUS
  Filled 2020-12-09 (×2): qty 50

## 2020-12-09 MED ORDER — FREE WATER
200.0000 mL | Status: DC
  Administered 2020-12-09 – 2020-12-10 (×6): 200 mL

## 2020-12-09 MED ORDER — VANCOMYCIN HCL 2000 MG/400ML IV SOLN
2000.0000 mg | Freq: Once | INTRAVENOUS | Status: AC
  Administered 2020-12-09: 2000 mg via INTRAVENOUS
  Filled 2020-12-09: qty 400

## 2020-12-09 MED ORDER — POTASSIUM CHLORIDE 20 MEQ PO PACK
20.0000 meq | PACK | ORAL | Status: AC
  Administered 2020-12-09 (×2): 20 meq
  Filled 2020-12-09 (×2): qty 1

## 2020-12-09 MED ORDER — PROSOURCE TF PO LIQD
45.0000 mL | Freq: Two times a day (BID) | ORAL | Status: DC
  Administered 2020-12-09 – 2020-12-11 (×4): 45 mL
  Filled 2020-12-09: qty 45

## 2020-12-09 MED ORDER — STERILE WATER FOR INJECTION IJ SOLN
INTRAMUSCULAR | Status: AC
  Administered 2020-12-09: 2.2 mL
  Filled 2020-12-09: qty 10

## 2020-12-09 MED ORDER — FENTANYL CITRATE (PF) 100 MCG/2ML IJ SOLN
50.0000 ug | INTRAMUSCULAR | Status: DC | PRN
  Administered 2020-12-11: 100 ug via INTRAVENOUS
  Administered 2020-12-11: 50 ug via INTRAVENOUS
  Administered 2020-12-12 – 2020-12-19 (×15): 100 ug via INTRAVENOUS
  Administered 2020-12-19: 50 ug via INTRAVENOUS
  Administered 2020-12-21: 100 ug via INTRAVENOUS
  Filled 2020-12-09 (×20): qty 2

## 2020-12-09 MED ORDER — ALTEPLASE 2 MG IJ SOLR
2.0000 mg | Freq: Once | INTRAMUSCULAR | Status: AC
  Administered 2020-12-09: 2 mg
  Filled 2020-12-09: qty 2

## 2020-12-09 MED ORDER — PIVOT 1.5 CAL PO LIQD
1000.0000 mL | ORAL | Status: DC
  Administered 2020-12-09 – 2020-12-25 (×14): 1000 mL
  Filled 2020-12-09: qty 1000

## 2020-12-09 MED ORDER — FENTANYL CITRATE (PF) 100 MCG/2ML IJ SOLN
100.0000 ug | Freq: Once | INTRAMUSCULAR | Status: AC
  Administered 2020-12-09: 100 ug via INTRAVENOUS
  Filled 2020-12-09: qty 2

## 2020-12-09 MED ORDER — PERFLUTREN LIPID MICROSPHERE
1.0000 mL | INTRAVENOUS | Status: AC | PRN
  Administered 2020-12-09: 4 mL via INTRAVENOUS
  Filled 2020-12-09: qty 10

## 2020-12-09 MED ORDER — SODIUM CHLORIDE 0.9 % IV SOLN
2.0000 g | Freq: Three times a day (TID) | INTRAVENOUS | Status: DC
  Administered 2020-12-09 – 2020-12-10 (×3): 2 g via INTRAVENOUS
  Filled 2020-12-09 (×5): qty 2

## 2020-12-09 MED ORDER — JUVEN PO PACK
1.0000 | PACK | Freq: Two times a day (BID) | ORAL | Status: DC
  Administered 2020-12-10 – 2021-01-05 (×53): 1

## 2020-12-09 NOTE — Progress Notes (Signed)
*  PRELIMINARY RESULTS* Echocardiogram 2D Echocardiogram has been performed. Definity IV Contrast used on this study, no adverse reactions reported.  Ryan Day Ryan Day 12/09/2020, 11:01 AM

## 2020-12-09 NOTE — Consult Note (Signed)
[place] for Electrolyte Monitoring and Replacement   Recent Labs: Potassium (mmol/L)  Date Value  12/09/2020 3.1 (L)   Magnesium (mg/dL)  Date Value  12/09/2020 2.2   Calcium (mg/dL)  Date Value  12/09/2020 9.0   Albumin (g/dL)  Date Value  12/07/2020 3.1 (L)  02/01/2020 4.4   Phosphorus (mg/dL)  Date Value  12/09/2020 4.1   Sodium (mmol/L)  Date Value  12/09/2020 147 (H)  02/01/2020 143   Assessment: Patient is a 70 y/o M presented with sepsis. Pt presented with complaints of recurrent UTI and hematuria. 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 PT has chronic tracheostomy and PEG tube. Planning to start tube feeds 3/19. Pharmacy has been consulted to assist with electrolyte monitoring and replacement as indicated. Scr trending down 1.87 > 1.66.   MIVF: LR at 150 mL/hr > 1/2NS @50  mL/hr.   Goal of Therapy:  Electrolytes within normal limits  Plan:  --Currently on free water 200 mL per tube q4h. Na trending down.  --Medical team ordered KCl 10 MEq x 2 IV + KCl 20 mEq PO q4H x 2 doses.  --Follow-up electrolytes with AM labs  Oswald Hillock, PharmD 12/09/2020 8:38 AM

## 2020-12-09 NOTE — Progress Notes (Signed)
Pharmacy Antibiotic Note  Ryan Day is a 70 y.o. male admitted on 12/07/2020 with sepsis. Pt presented with complaints of recurrent UTI and hematuria with foley cath last placed ~2 weeks ago, exchanged in ED. 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 Pt has PEG and trache. Pt admitted 11/22-12/5 for HCAP and sacral ulcer with osteomyelitis, cx grew enterobacter cloacae. 1/26 and 2/18 UCx with pseudomonas. Pt has received vanc, Unasyn, Zosyn, and cefepime on recent previous admissions. Renal function worsening, Scr 1.02>1.87 (baseline ~ 0.5). Pt loaded with vancomycin 1 g 3/17 @ 1942 and 2 g 3/18 @ 0530. Surgery & ID consulted. Planning for bronchoscopy. Pharmacy has been consulted for Cefepime, Vancomycin dosing.  LA 2.8>4.4>7.3, PCT 0.23> >150, WBC 2>17.6, fevers with Tmax 101.84  Day 1 abx  Plan: --Decrease cefepime to 2 gm IV Q12H --Will dose vanc based on unstable renal function with random levels - random level ordered for 3/19 @ 0500 --Monitor renal function --F/u susceptibilities   3/19:   Vanc random = 20 mcg/mL  Vancomycin 2 gm IV X 1 given on 3/18 @ ~ 0500 Will give additional Vancomycin 2 gm on 3/19 @ 0700 and repeat random vanc on 3/20 @ 0700.               Height: 6' 5.01" (195.6 cm) Weight: 125.2 kg (276 lb 0.3 oz) IBW/kg (Calculated) : 89.12  Temp (24hrs), Avg:100 F (37.8 C), Min:97.52 F (36.4 C), Max:101.48 F (38.6 C)  Recent Labs  Lab 12/07/20 1725 12/07/20 1933 12/08/20 0450 12/08/20 0921 12/08/20 1151 12/08/20 1927 12/08/20 2337 12/09/20 0454  WBC 2.0*  --  17.6*  --   --   --   --  15.2*  CREATININE 1.02  --  1.87*  --   --   --   --  1.66*  LATICACIDVEN 2.8*   < >  --  7.3* 7.4* 5.1* 3.3* 2.3*  VANCORANDOM  --   --   --   --   --   --   --  20   < > = values in this interval not displayed.    Estimated Creatinine Clearance: 62.3 mL/min (A) (by C-G formula based on SCr of 1.66 mg/dL (H)).    Allergies  Allergen  Reactions  . Shrimp (Diagnostic)     Experiences facial droop when eating seafood    Antimicrobials this admission: 3/18 vancomycin  >>  3/18 cefepime  >>   Dose adjustments this admission: -Vancomycin 1500mg  q12h > 1750mg  q24h -Cefepime 2g q8h > 2g q12h  Microbiology results: 3/17 BCx: e. Cloacae, MRSA, e. faecalis  3/17 UCx: pending 3/18 Sputum: pending 3/17 MRSA PCR: positive  Thank you for allowing pharmacy to be a part of this patient's care.  Orene Desanctis, PharmD Pharmacy Resident  12/09/2020 6:47 AM

## 2020-12-10 LAB — SODIUM: Sodium: 155 mmol/L — ABNORMAL HIGH (ref 135–145)

## 2020-12-10 LAB — CULTURE, RESPIRATORY W GRAM STAIN: Gram Stain: NONE SEEN

## 2020-12-10 LAB — CBC
HCT: 25 % — ABNORMAL LOW (ref 39.0–52.0)
Hemoglobin: 7.6 g/dL — ABNORMAL LOW (ref 13.0–17.0)
MCH: 29 pg (ref 26.0–34.0)
MCHC: 30.4 g/dL (ref 30.0–36.0)
MCV: 95.4 fL (ref 80.0–100.0)
Platelets: 126 10*3/uL — ABNORMAL LOW (ref 150–400)
RBC: 2.62 MIL/uL — ABNORMAL LOW (ref 4.22–5.81)
RDW: 16.9 % — ABNORMAL HIGH (ref 11.5–15.5)
WBC: 15.7 10*3/uL — ABNORMAL HIGH (ref 4.0–10.5)
nRBC: 0.2 % (ref 0.0–0.2)

## 2020-12-10 LAB — BASIC METABOLIC PANEL
Anion gap: 8 (ref 5–15)
BUN: 45 mg/dL — ABNORMAL HIGH (ref 8–23)
CO2: 23 mmol/L (ref 22–32)
Calcium: 9.3 mg/dL (ref 8.9–10.3)
Chloride: 124 mmol/L — ABNORMAL HIGH (ref 98–111)
Creatinine, Ser: 1.2 mg/dL (ref 0.61–1.24)
GFR, Estimated: >60 mL/min (ref >60)
Glucose, Bld: 132 mg/dL — ABNORMAL HIGH (ref 70–99)
Potassium: 3.2 mmol/L — ABNORMAL LOW (ref 3.5–5.1)
Sodium: 155 mmol/L — ABNORMAL HIGH (ref 135–145)

## 2020-12-10 LAB — URINE CULTURE: Culture: >=100000 — AB

## 2020-12-10 LAB — VANCOMYCIN, RANDOM: Vancomycin Rm: 23

## 2020-12-10 LAB — POTASSIUM: Potassium: 3.1 mmol/L — ABNORMAL LOW (ref 3.5–5.1)

## 2020-12-10 LAB — GLUCOSE, CAPILLARY
Glucose-Capillary: 121 mg/dL — ABNORMAL HIGH (ref 70–99)
Glucose-Capillary: 119 mg/dL — ABNORMAL HIGH (ref 70–99)
Glucose-Capillary: 141 mg/dL — ABNORMAL HIGH (ref 70–99)

## 2020-12-10 LAB — PHOSPHORUS: Phosphorus: 2 mg/dL — ABNORMAL LOW (ref 2.5–4.6)

## 2020-12-10 LAB — MAGNESIUM: Magnesium: 2.5 mg/dL — ABNORMAL HIGH (ref 1.7–2.4)

## 2020-12-10 MED ORDER — IBUPROFEN 100 MG/5ML PO SUSP
400.0000 mg | Freq: Once | ORAL | Status: AC
  Administered 2020-12-10: 400 mg
  Filled 2020-12-10 (×2): qty 20

## 2020-12-10 MED ORDER — POTASSIUM CHLORIDE 10 MEQ/50ML IV SOLN
10.0000 meq | INTRAVENOUS | Status: AC
  Administered 2020-12-10 (×2): 10 meq via INTRAVENOUS
  Filled 2020-12-10 (×2): qty 50

## 2020-12-10 MED ORDER — SODIUM CHLORIDE 0.9 % IV SOLN
1.0000 g | Freq: Three times a day (TID) | INTRAVENOUS | Status: AC
  Administered 2020-12-10 – 2020-12-11 (×5): 1 g via INTRAVENOUS
  Filled 2020-12-10 (×7): qty 1

## 2020-12-10 MED ORDER — POTASSIUM PHOSPHATES 15 MMOLE/5ML IV SOLN
15.0000 mmol | Freq: Once | INTRAVENOUS | Status: AC
  Administered 2020-12-10: 15 mmol via INTRAVENOUS
  Filled 2020-12-10: qty 5

## 2020-12-10 MED ORDER — POTASSIUM CHLORIDE 20 MEQ PO PACK
20.0000 meq | PACK | Freq: Once | ORAL | Status: AC
  Administered 2020-12-10: 20 meq
  Filled 2020-12-10: qty 1

## 2020-12-10 MED ORDER — FREE WATER
200.0000 mL | Status: DC
  Administered 2020-12-10 – 2020-12-18 (×96): 200 mL

## 2020-12-10 MED ORDER — POTASSIUM CHLORIDE 10 MEQ/50ML IV SOLN
10.0000 meq | INTRAVENOUS | Status: AC
  Administered 2020-12-10 (×4): 10 meq via INTRAVENOUS
  Filled 2020-12-10 (×4): qty 50

## 2020-12-10 MED ORDER — POTASSIUM CHLORIDE 20 MEQ PO PACK
20.0000 meq | PACK | ORAL | Status: AC
  Administered 2020-12-10 (×2): 20 meq
  Filled 2020-12-10 (×2): qty 1

## 2020-12-10 MED ORDER — DEXTROSE 5 % IV SOLN: INTRAVENOUS | Status: DC

## 2020-12-10 NOTE — Progress Notes (Addendum)
Pharmacy Antibiotic Note  Ryan Day is a 70 y.o. male admitted on 12/07/2020 with sepsis. Pt presented with complaints of recurrent UTI and hematuria with foley cath last placed ~2 weeks ago, exchanged in ED. 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 Pt has PEG and trache. Pt admitted 11/22-12/5 for HCAP and sacral ulcer with osteomyelitis. Hx of enterobacter cloacae, pseudomonas in ucx. Pt has received vanc, Unasyn, Zosyn, and cefepime on recent previous admissions. Surgery & ID consulted. Pharmacy has been consulted for Cefepime, Vancomycin dosing. Tmin 95.18 in the last 24 hours. Pt is hypotensive. Lactic trending down and procal still elevated.   Plan: Adjusted cefepime to 2g q8H.   Pt received vancomycin 1 g on 3/17 @1842  followed by 2g on 3/18 @0515 . Vanc random on 3/19 returned at 20 and pt was given another dose of 2000 mg x 1 and now the 24 hour vancomycin level is at 23. Will hold vancomycin dose today and order a random level with AM labs. Creatinine is trending down. Follow up with random level if level is < 15, can probably restart vancomycin using AUC dosing. Continue to monitor creatinine while on vancomycin.  Current abx regimen will cover PSA, MRSA, enterobacter cloacae, and enterococcus faecalis  F/u with sensitivities of cultures listed below and narrow as indicated.    Height: 6' 5.01" (195.6 cm) Weight: 125.1 kg (275 lb 12.7 oz) IBW/kg (Calculated) : 89.12  Temp (24hrs), Avg:96.8 F (36 C), Min:95.18 F (35.1 C), Max:98.1 F (36.7 C)  Recent Labs  Lab 12/07/20 1725 12/07/20 1933 12/08/20 0450 12/08/20 0921 12/08/20 1151 12/08/20 1927 12/08/20 2337 12/09/20 0454 12/10/20 0513 12/10/20 0652  WBC 2.0*  --  17.6*  --   --   --   --  15.2* 15.7*  --   CREATININE 1.02  --  1.87*  --   --   --   --  1.66* 1.20  --   LATICACIDVEN 2.8*   < >  --  7.3* 7.4* 5.1* 3.3* 2.3*  --   --   VANCORANDOM  --   --   --   --   --   --   --  20   --  23   < > = values in this interval not displayed.    Estimated Creatinine Clearance: 86.3 mL/min (by C-G formula based on SCr of 1.2 mg/dL).    Allergies  Allergen Reactions  . Shrimp (Diagnostic)     Experiences facial droop when eating seafood    Antimicrobials this admission: 3/18 vancomycin  >>  3/18 cefepime  >>   Dose adjustments this admission: -Vancomycin 1500mg  q12h > 1750mg  q24h -Cefepime 2g q8h > 2g q12h  Microbiology results: 3/17 BCx: e. Cloacae, MRSA, e. Norva Karvonen POSITIVE COCCI 3/17 UCx: >=100,000 COLONIES/mL GRAM NEGATIVE RODS 3/18 resp cx: RARE PSEUDOMONAS AERUGINOSA + RARE GRAM POSITIVE COCCI IN PAIRS 3/18 BRONCHIAL ALVEOLAR LAVAGE: ABUNDANT GRAM POSITIVE COCCI MODERATE GRAM NEGATIVE RODS  3/17 MRSA PCR: positive  Thank you for allowing pharmacy to be a part of this patient's care.  Oswald Hillock, PharmD, BCPS 12/10/2020 8:54 AM

## 2020-12-10 NOTE — Consult Note (Signed)
[place] for Electrolyte Monitoring and Replacement   Recent Labs: Potassium (mmol/L)  Date Value  12/10/2020 3.2 (L)   Magnesium (mg/dL)  Date Value  12/10/2020 2.5 (H)   Calcium (mg/dL)  Date Value  12/10/2020 9.3   Albumin (g/dL)  Date Value  12/07/2020 3.1 (L)  02/01/2020 4.4   Phosphorus (mg/dL)  Date Value  12/10/2020 2.0 (L)   Sodium (mmol/L)  Date Value  12/10/2020 155 (H)  02/01/2020 143   Assessment: Patient is a 70 y/o M presented with sepsis. Pt presented with complaints of recurrent UTI and hematuria. 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 PT has chronic tracheostomy and PEG tube. Planning to start tube feeds 3/19. Pharmacy has been consulted to assist with electrolyte monitoring and replacement as indicated. Scr trending down 1.87 > 1.66.   MIVF: LR at 150 mL/hr > 1/2NS @50  mL/hr.   Goal of Therapy:  Electrolytes within normal limits  Plan:  --Currently on free water 200 mL per tube q4h > q2H. Na trending up.  --Medical team ordered KCl 10 MEq x 2 IV + KCl 40 mEq PO x 1 doses and KPhos 15 mmol IV x 1.  --Follow-up electrolytes with AM labs  Oswald Hillock, PharmD 12/10/2020 8:52 AM

## 2020-12-10 NOTE — Progress Notes (Signed)
Pharmacy Antibiotic Note  Ryan Day is a 70 y.o. male admitted on 12/07/2020 with sepsis. Pt presented with complaints of recurrent UTI and hematuria with foley cath last placed ~2 weeks ago, exchanged in ED. 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 Pt has PEG and trache. Pt admitted 11/22-12/5 for HCAP and sacral ulcer with osteomyelitis. Hx of enterobacter cloacae, pseudomonas in ucx. Pt has received vanc, Unasyn, Zosyn, and cefepime on recent previous admissions. Surgery & ID consulted.  Pharmacy has been consulted for Cefepime, Vancomycin dosing. Tmin 95.18 in the last 24 hours. Pt is hypotensive. Lactic trending down and procal still elevated.   Today, 12/10/20  --Tracheal aspirate culture with MDR PSA (R ceftazidime), confirmed resistance to cefepime and Zosyn with micro lab as these were not reported --Discussed with intensivist. Will broaden cefepime to meropenem for now  Plan:  Discontinue cefepime to 2g q8h  Start meropenem 1 g IV q8h  Pt received vancomycin 1 g on 3/17 @1842  followed by 2g on 3/18 @0515 . Vanc random on 3/19 returned at 20 and pt was given another dose of 2000 mg x 1 and now the 24 hour vancomycin level is at 23. Will hold vancomycin dose today and order a random level with AM labs. Creatinine is trending down. Follow up with random level if level is < 15, can probably restart vancomycin using AUC dosing. Continue to monitor creatinine while on vancomycin.  Current abx regimen will cover PSA, MRSA, enterobacter cloacae, and enterococcus faecalis  F/u with sensitivities of cultures listed below and narrow as indicated.    Height: 6' 5.01" (195.6 cm) Weight: 125.1 kg (275 lb 12.7 oz) IBW/kg (Calculated) : 89.12  Temp (24hrs), Avg:97.1 F (36.2 C), Min:82.04 F (27.8 C), Max:99.68 F (37.6 C)  Recent Labs  Lab 12/07/20 1725 12/07/20 1933 12/08/20 0450 12/08/20 0921 12/08/20 1151 12/08/20 1927 12/08/20 2337  12/09/20 0454 12/10/20 0513 12/10/20 0652  WBC 2.0*  --  17.6*  --   --   --   --  15.2* 15.7*  --   CREATININE 1.02  --  1.87*  --   --   --   --  1.66* 1.20  --   LATICACIDVEN 2.8*   < >  --  7.3* 7.4* 5.1* 3.3* 2.3*  --   --   VANCORANDOM  --   --   --   --   --   --   --  20  --  23   < > = values in this interval not displayed.    Estimated Creatinine Clearance: 86.3 mL/min (by C-G formula based on SCr of 1.2 mg/dL).    Allergies  Allergen Reactions  . Shrimp (Diagnostic)     Experiences facial droop when eating seafood    Antimicrobials this admission: 3/18 vancomycin  >>  3/18 cefepime  >>  3/20 3/20 meropenem >>  Dose adjustments this admission: -Vancomycin 1500mg  q12h > 1750mg  q24h -Cefepime 2g q8h > 2g q12h  Microbiology results: 3/17 BCx: e. Cloacae, MRSA, e. Norva Karvonen POSITIVE COCCI 3/17 UCx: >=100,000 COLONIES/mL GRAM NEGATIVE RODS 3/18 resp cx: RARE PSEUDOMONAS AERUGINOSA + RARE GRAM POSITIVE COCCI IN PAIRS 3/18 BRONCHIAL ALVEOLAR LAVAGE: >=100,000 COLONIES/mL PSEUDOMONAS AERUGINOSA  40,000 COLONIES/mL STAPHYLOCOCCUS AUREUS  20,000 COLONIES/mL ENTEROCOCCUS FAECALIS  3/17 MRSA PCR: positive  Thank you for allowing pharmacy to be a part of this patient's care.  Benita Gutter 12/10/2020 2:45 PM

## 2020-12-10 NOTE — Progress Notes (Signed)
NAME:  Ryan Day, MRN:  [id number], DOB:  [birth year], LOS: 3 ADMISSION DATE:  12/07/2020, CONSULTATION DATE: 12/07/2020 REFERRING MD: Dr. 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  According to pts family last week pt noted to have sediment in his urine at home concerning for UTI.  He had a foley catheter placed 2 weeks ago and completed a 7 day course of abx therapy due to UTI.  Due to concerns of recurrent UTI pts family contacted pts PCP office last week, and heard from his PCP this week and were awaiting urine culture results.  However, pt noted to have hematuria yesterday and today along with sinus tachycardia hr 120's.  Pts daughter is a Marine scientist and due to concern of sepsis pt transported to the ER via EMS for further evaluation and treatment.    ED course Upon arrival to the ER pts O2 sats were 75-80% on RA, therefore pt placed on NRB with O2 sats initially increasing to 100%.  Pt transitioned to trach collar with initial O2 sats in the low 90's.  However, pt noted to have mucous plugging resulting in hypoxia requiring exchange of tracheostomy from a cuffless trach to a size 6 mm cuffed trach and pt placed on mechanical ventilation.  ER vital signs were: temp 101.7 rectally, sbp 60-80's, and hr 130's. Lab results revealed Na+ 153, chloride 119, glucose 147, lactic acid 2.8, wbc 2.0, hbg 11.1, and UA positive for UTI.  CXR negative and COVID-19/Influenza PCR results pending.  Sepsis protocol initiated and pt received cefepime, vancomycin, and 2L LR bolus.  Pt remained severely hypotensive requiring levophed gtt and right femoral central line placement.  PCCM team contacted for ICU admission.    12/08/20- patient is improved some he is communicative.  Wife [name] at bedside we reviewed care plan.   12/10/20- patient is improved he is down to 7mg on levophed and 30%Fio2  on ventilator, mentating well. Reviewed careplan with wife LVaughan Bastatoday.   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 C5 Cervical Fracture (following a mechanical fall at home) s/p C3-6 Laminectomy and Posterior Instrumentation and Fusion 08/4  Quadriplegia  Spinal Cord Compression  Chronic Tracheostomy and PEG    Significant Hospital Events: Including procedures, antibiotic start and stop dates in addition to other pertinent events   03/17: Pt admitted to ICU with urosepsis requiring vasopressors and acute on chronic hypoxic respiratory failure secondary to mucous plugging requiring exchange of cuffless trach to size 6 mm cuffed trach to be placed on mechanical ventilation  03/17: Right femoral central line placed by ER physician  03/17: CT Abd Pelvis findings with cystitis. Correlation with urinalysis is Recommended. Mild to moderate severity bibasilar atelectasis and/or Infiltrate. Sigmoid diverticulosis. Sacral decubitus ulcer, as described above. MRI correlation is recommended, as sequelae associated with acute osteomyelitis cannot be excluded. Aortic atherosclerosis. 03/17: CT Head revealed mild chronic ischemic white matter disease. No acute intracranial abnormality seen 03/17: Vancomycin x1 dose and Cefepime x1 dose 03/17: Zosyn>>  Interim History / Subjective:  Pt lethargic no signs of respiratory distress mechanically intubated via tracheostomy   Objective   Blood pressure (!) 97/53, pulse 84, temperature 97.88 F (36.6 C), temperature source Bladder, resp. rate (!) 25, height 6' 5.01" (1.956 m), weight 125.1 kg, SpO2 94 %.    Vent  Mode: PRVC FiO2 (%):  [28 %-60 %] 60 % Set Rate:  [24 bmp] 24 bmp Vt Set:  [500 mL] 500 mL PEEP:  [8 cmH20] 8 cmH20 Pressure Support:  [0 cmH20] 0 cmH20 Plateau Pressure:  [21 cmH20] 21 cmH20   Intake/Output Summary (Last 24 hours) at 12/10/2020 1129 Last data filed at  12/10/2020 0600 Gross per 24 hour  Intake 981.94 ml  Output 4251 ml  Net -3269.06 ml   Filed Weights   12/07/20 2050 12/09/20 0500 12/10/20 0500  Weight: 114 kg 125.2 kg 125.1 kg   Examination: General: chronically appearing male, NAD receiving mechanical ventilation via trach  HENT: supple, no JVD  Lungs: diminished throughout, even, non labored  Cardiovascular: sinus tach, no R/G, 2+ radial/1+ distal pulses, 2+ bilateral lower extremity edema  Abdomen: hypoactive BS x, soft, non distended  Extremities: quadriplegic Neuro: communicates to verbal language Skin: sacral spine decubitus  GU: chronic foley in place   Labs/imaging personally reviewed   CT Abd/Pelvis CXR CT Head  UA  Resolved Hospital Problem list   N/A  Assessment & Plan:   Acute on chronic hypoxic respiratory failure secondary to mucous plugging which is chronic  Mechanical ventilation via chronic tracheostomy        S/p bronch - many GPCs , cleared bilateral airways  Full vent support for now-vent settings reviewed and established SBT once all parameters met VAP bundle implemented  Aggressive pulmonary hygiene    Hypotension secondary to urosepsis  Continuous telemetry monitoring  Aggressive fluid resuscitation and prn levophed/vasopressin gtts to maintain map >65  Mild acute renal failure secondary to ATN Severe lactic acidosis  Trend BMP and lactic acid   Replace electrolytes as indicated  Monitor UOP Avoid nephrotoxic medications   Urosepsis secondary to chronic foley  Leukopenia  Trend WBC and monitor fever curve  Trend PCT  Follow cultures  Continue zosyn  Foley catheter exchanged in the ED  Anemia without obvious acute blood loss Trend CBC  Monitor for s/sx of bleeding Transfuse for hgb <7  Sacral spine pressure ulcer-pt previously had wound vac in place with removal  Quadriplegia-bed bound Turn q2hrs  Wound Care consulted appreciate input   Acute toxic encephalopathy  Frequent  reorientation  Will attempt to avoid sedating medications   Best practice (evaluated daily)  Diet:  NPO Pain/Anxiety/Delirium protocol (if indicated): Yes (RASS goal 0) VAP protocol (if indicated): Yes DVT prophylaxis: Systemic AC GI prophylaxis: H2B Glucose control:  SSI No Central venous access:  Yes, and it is still needed Arterial line:  N/A Foley:  Yes, and it is still needed Mobility:  bed rest  PT consulted: N/A Last date of multidisciplinary goals of care discussion [N/A] Code Status:  full code Disposition: ICU   Labs   CBC: Recent Labs  Lab 12/07/20 1725 12/08/20 0450 12/09/20 0454 12/10/20 0513  WBC 2.0* 17.6* 15.2* 15.7*  NEUTROABS 1.3*  --   --   --   HGB 11.1* 9.7* 8.2* 7.6*  HCT 36.1* 30.5* 26.3* 25.0*  MCV 95.3 95.3 96.0 95.4  PLT 213 181 133* 126*    Basic Metabolic Panel: Recent Labs  Lab 12/07/20 1725 12/08/20 0450 12/08/20 1927 12/09/20 0454 12/09/20 1538 12/10/20 0513  NA 153* 150* 146* 147* 153* 155*  K 3.5 3.8  --  3.1* 3.5 3.2*  CL 119* 115*  --  116*  --  124*  CO2 26 23  --  24  --  23  GLUCOSE 147* 110*  --  114*  --  132*  BUN 52* 56*  --  54*  --  45*  CREATININE 1.02 1.87*  --  1.66*  --  1.20  CALCIUM 10.2 9.5  --  9.0  --  9.3  MG  --  2.1  --  2.2  --  2.5*  PHOS  --  4.3  --  4.1  --  2.0*   GFR: Estimated Creatinine Clearance: 86.3 mL/min (by C-G formula based on SCr of 1.2 mg/dL). Recent Labs  Lab 12/07/20 1725 12/07/20 1933 12/08/20 0450 12/08/20 0921 12/08/20 1151 12/08/20 1927 12/08/20 2337 12/09/20 0454 12/10/20 0513  PROCALCITON 0.23  --  >150.00  --   --   --   --  >150.00  --   WBC 2.0*  --  17.6*  --   --   --   --  15.2* 15.7*  LATICACIDVEN 2.8*   < >  --    < > 7.4* 5.1* 3.3* 2.3*  --    < > = values in this interval not displayed.    Liver Function Tests: Recent Labs  Lab 12/07/20 1725  AST 32  ALT 26  ALKPHOS 179*  BILITOT 1.2  PROT 8.4*  ALBUMIN 3.1*   No results for input(s): LIPASE,  AMYLASE in the last 168 hours. No results for input(s): AMMONIA in the last 168 hours.  ABG    Component Value Date/Time   PHART 7.38 12/08/2020 0500   PCO2ART 42 12/08/2020 0500   PO2ART 115 (H) 12/08/2020 0500   HCO3 24.8 12/08/2020 0500   ACIDBASEDEF 0.4 12/08/2020 0500   O2SAT 98.4 12/08/2020 0500     Coagulation Profile: No results for input(s): INR, PROTIME in the last 168 hours.  Cardiac Enzymes: No results for input(s): CKTOTAL, CKMB, CKMBINDEX, TROPONINI in the last 168 hours.  HbA1C: Hgb A1c MFr Bld  Date/Time Value Ref Range Status  06/09/2020 07:00 AM 5.8 (H) 4.8 - 5.6 % Final    Comment:    (NOTE) Pre diabetes:          5.7%-6.4%  Diabetes:              >6.4%  Glycemic control for   <7.0% adults with diabetes     CBG: Recent Labs  Lab 12/09/20 1534 12/09/20 1945 12/09/20 2332 12/10/20 0346 12/10/20 1053  GLUCAP 95 85 93 121* 119*    Review of Systems:   Unable to assess pt chronically trached currently requiring mechanical ventilation   Past Medical History:  He,  has a past medical history of Acute on chronic respiratory failure with hypoxia (HCC), Acute renal injury due to hypovolemia [facility]), Autonomic instability, Cardiac arrest (HCC), Cervical spinal cord injury, sequela (HCC), Elevated alkaline phosphatase level, 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   Surgical History:   Past Surgical History:  Procedure Laterality Date  . APPLICATION OF WOUND VAC N/A 08/16/2020   Procedure: APPLICATION OF WOUND VAC;  Surgeon: Duanne Guess, MD;  Location: [facility] ORS;  Service: General;  Laterality: N/A;  . COLONOSCOPY WITH PROPOFOL N/A 12/05/2017   Procedure: COLONOSCOPY WITH PROPOFOL;  Surgeon: Toney Reil, MD;  Location: [facility] ENDOSCOPY;  Service: Gastroenterology;  Laterality: N/A;  . ESOPHAGOGASTRODUODENOSCOPY  12/05/2017   Procedure: ESOPHAGOGASTRODUODENOSCOPY (EGD);  Surgeon: Toney Reil, MD;   Location: [facility] ENDOSCOPY;  Service: Gastroenterology;;  . INCISION AND DRAINAGE ABSCESS N/A 08/16/2020   Procedure: INCISION AND DRAINAGE ABSCESS, SACRAL;  Surgeon: Duanne Guess, MD;  Location: [facility] ORS;  Service: General;  Laterality: N/A;     Social History:   reports that he has never smoked. He has never used smokeless tobacco. He reports previous alcohol use. He reports that he does not use drugs.   Family History:  His family history includes Alcohol abuse in his brother; Cancer in his brother and father; Heart disease (age of onset: 35) in his brother; Liver cancer in his brother.   Allergies Allergies  Allergen Reactions  . Shrimp (Diagnostic)     Experiences facial droop when eating seafood     Home Medications  Prior to Admission medications   Medication Sig Start Date End Date Taking? Authorizing Provider  acetaminophen (TYLENOL) 160 MG/5ML solution Place 10-20 mLs (320-640 mg total) into feeding tube every 4 (four) hours as needed for mild pain. 07/18/20   Love, Ivan Anchors, PA-C  ascorbic acid (VITAMIN C) 500 MG tablet Place 1 tablet (500 mg total) into feeding tube 2 (two) times daily. 07/18/20   Love, Ivan Anchors, PA-C  baclofen (LIORESAL) 10 MG tablet Take 1 tablet (10 mg total) by mouth 3 (three) times daily. 11/12/20   Johnson, Megan P, DO  bisacodyl (DULCOLAX) 10 MG suppository Place 1 suppository (10 mg total) rectally at bedtime. 07/20/20   Love, Ivan Anchors, PA-C  chlorhexidine (PERIDEX) 0.12 % solution 15 mLs by Mouth Rinse route 2 (two) times daily. 07/20/20   Love, Ivan Anchors, PA-C  collagenase (SANTYL) ointment Apply topically daily. Patient not taking: Reported on 11/07/2020 07/20/20   Love, Ivan Anchors, PA-C  diclofenac Sodium (VOLTAREN) 1 % GEL Apply 4 g topically 4 (four) times daily. 08/31/20   Kathrine Haddock, NP  famotidine (PEPCID) 20 MG tablet Place 1 tablet (20 mg total) into feeding tube daily. 07/20/20   Love, Ivan Anchors, PA-C  ferrous sulfate 300 (60 Fe) MG/5ML  syrup Place 1.3 mLs (78 mg total) into feeding tube daily. 07/20/20   Love, Ivan Anchors, PA-C  fludrocortisone (FLORINEF) 0.1 MG tablet Take 200 mcg by mouth daily. 11/02/20   [provider]  FLUoxetine (PROZAC) 20 MG/5ML solution Place 10 mLs (40 mg total) into feeding tube daily. 11/22/20   Vigg, Avanti, MD  guaiFENesin 200 MG tablet Place 2 tablets (400 mg total) into feeding tube every 6 (six) hours. 07/20/20   Love, Ivan Anchors, PA-C  lidocaine (LIDODERM) 5 % Place 1 patch onto the skin daily. Remove & Discard patch within 12 hours or as directed by MD 07/20/20   Love, Ivan Anchors, PA-C  midodrine (PROAMATINE) 10 MG tablet Place 1 tablet (10 mg total) into feeding tube 3 (three) times daily with meals. 11/04/20   Park Liter P, DO  Mouthwashes (MOUTH RINSE) LIQD solution 15 mLs by Mouth Rinse route 2 times daily at 12 noon and 4 pm. 07/20/20   Love, Ivan Anchors, PA-C  Multiple Vitamin (MULTIVITAMIN) LIQD Place 5 mLs into feeding tube daily. 07/20/20   Love, Ivan Anchors, PA-C  nutrition supplement, JUVEN, (JUVEN) PACK Place 1 packet into feeding tube 2 (two) times daily between meals. 07/20/20   Love, Ivan Anchors, PA-C  Nutritional Supplements (FEEDING SUPPLEMENT, OSMOLITE 1.5 CAL,) LIQD Place 474 mLs into feeding tube 4 (four) times daily. 07/20/20   Love, Ivan Anchors, PA-C  Nutritional Supplements (FEEDING SUPPLEMENT, PROSOURCE TF,) liquid Place 45 mLs into feeding tube 2 (two) times daily. 07/20/20   Love, Ivan Anchors, PA-C  ondansetron (ZOFRAN) 4 MG tablet Place 1 tablet (4 mg total) into feeding tube  every 8 (eight) hours as needed for nausea, vomiting or refractory nausea / vomiting. 07/20/20   Love, Ivan Anchors, PA-C  polycarbophil (FIBERCON) 625 MG tablet Place 1 tablet (625 mg total) into feeding tube daily. 07/20/20   Love, Ivan Anchors, PA-C  rivaroxaban (XARELTO) 10 MG TABS tablet Take one tablet per tube daily 08/08/20   [name], MD  saccharomyces boulardii (FLORASTOR) 250 MG capsule Place 1 capsule  (250 mg total) into feeding tube 2 (two) times daily. 07/20/20   Love, Ivan Anchors, PA-C  sennosides (SENOKOT) 8.8 MG/5ML syrup Place 5 mLs into feeding tube daily at 6 (six) AM. 07/20/20   Love, Ivan Anchors, PA-C  simethicone (MYLICON) 40 JW/9.2HV drops Place 1.2 mLs (80 mg total) into feeding tube 4 (four) times daily. 07/20/20   Love, Ivan Anchors, PA-C  traZODone (DESYREL) 50 MG tablet Place 1 tablet (50 mg total) into feeding tube at bedtime. 10/23/20   Jon Billings, NP  Water For Irrigation, Sterile (FREE WATER) SOLN Place 400 mLs into feeding tube every 4 (four) hours. Patient taking differently: Place 300 mLs into feeding tube every 4 (four) hours. 07/20/20   Love, Ivan Anchors, PA-C  zinc sulfate 220 (50 Zn) MG capsule Place 1 capsule (220 mg total) into feeding tube daily. 07/20/20   Bary Leriche, PA-C         Critical care provider statement:    Critical care time (minutes):  33   Critical care time was exclusive of:  Separately billable procedures and  treating other patients   Critical care was necessary to treat or prevent imminent or  life-threatening deterioration of the following conditions:  mucu plugging, acute respiratory distress with hypoxemia, possible sepsis   Critical care was time spent personally by me on the following  activities:  Development of treatment plan with patient or surrogate,  discussions with consultants, evaluation of patient's response to  treatment, examination of patient, obtaining history from patient or  surrogate, ordering and performing treatments and interventions, ordering  and review of laboratory studies and re-evaluation of patient's condition   I assumed direction of critical care for this patient from another  provider in my specialty: no         Ottie Glazier, M.D.  Pulmonary & [place]

## 2020-12-11 DIAGNOSIS — G8250 Quadriplegia, unspecified: Secondary | ICD-10-CM

## 2020-12-11 DIAGNOSIS — N179 Acute kidney failure, unspecified: Secondary | ICD-10-CM | POA: Diagnosis not present

## 2020-12-11 DIAGNOSIS — A4181 Sepsis due to Enterococcus: Secondary | ICD-10-CM | POA: Diagnosis not present

## 2020-12-11 DIAGNOSIS — L89154 Pressure ulcer of sacral region, stage 4: Secondary | ICD-10-CM

## 2020-12-11 DIAGNOSIS — A4152 Sepsis due to Pseudomonas: Principal | ICD-10-CM

## 2020-12-11 DIAGNOSIS — R319 Hematuria, unspecified: Secondary | ICD-10-CM

## 2020-12-11 DIAGNOSIS — J9601 Acute respiratory failure with hypoxia: Secondary | ICD-10-CM | POA: Diagnosis not present

## 2020-12-11 DIAGNOSIS — A4189 Other specified sepsis: Secondary | ICD-10-CM | POA: Diagnosis not present

## 2020-12-11 DIAGNOSIS — Z930 Tracheostomy status: Secondary | ICD-10-CM

## 2020-12-11 DIAGNOSIS — N390 Urinary tract infection, site not specified: Secondary | ICD-10-CM

## 2020-12-11 DIAGNOSIS — A4102 Sepsis due to Methicillin resistant Staphylococcus aureus: Secondary | ICD-10-CM

## 2020-12-11 DIAGNOSIS — N170 Acute kidney failure with tubular necrosis: Secondary | ICD-10-CM

## 2020-12-11 DIAGNOSIS — R6521 Severe sepsis with septic shock: Secondary | ICD-10-CM | POA: Diagnosis not present

## 2020-12-11 LAB — VANCOMYCIN, RANDOM: Vancomycin Rm: 14

## 2020-12-11 LAB — CBC
HCT: 23.6 % — ABNORMAL LOW (ref 39.0–52.0)
Hemoglobin: 7.5 g/dL — ABNORMAL LOW (ref 13.0–17.0)
MCH: 29.8 pg (ref 26.0–34.0)
MCHC: 31.8 g/dL (ref 30.0–36.0)
MCV: 93.7 fL (ref 80.0–100.0)
Platelets: 110 10*3/uL — ABNORMAL LOW (ref 150–400)
RBC: 2.52 MIL/uL — ABNORMAL LOW (ref 4.22–5.81)
RDW: 17 % — ABNORMAL HIGH (ref 11.5–15.5)
WBC: 11.2 10*3/uL — ABNORMAL HIGH (ref 4.0–10.5)
nRBC: 0.5 % — ABNORMAL HIGH (ref 0.0–0.2)

## 2020-12-11 LAB — BASIC METABOLIC PANEL
Anion gap: 3 — ABNORMAL LOW (ref 5–15)
BUN: 44 mg/dL — ABNORMAL HIGH (ref 8–23)
CO2: 25 mmol/L (ref 22–32)
Calcium: 9.1 mg/dL (ref 8.9–10.3)
Chloride: 126 mmol/L — ABNORMAL HIGH (ref 98–111)
Creatinine, Ser: 1.09 mg/dL (ref 0.61–1.24)
GFR, Estimated: >60 mL/min (ref >60)
Glucose, Bld: 170 mg/dL — ABNORMAL HIGH (ref 70–99)
Potassium: 3.4 mmol/L — ABNORMAL LOW (ref 3.5–5.1)
Sodium: 154 mmol/L — ABNORMAL HIGH (ref 135–145)

## 2020-12-11 LAB — GLUCOSE, CAPILLARY
Glucose-Capillary: 118 mg/dL — ABNORMAL HIGH (ref 70–99)
Glucose-Capillary: 147 mg/dL — ABNORMAL HIGH (ref 70–99)
Glucose-Capillary: 157 mg/dL — ABNORMAL HIGH (ref 70–99)
Glucose-Capillary: 157 mg/dL — ABNORMAL HIGH (ref 70–99)
Glucose-Capillary: 160 mg/dL — ABNORMAL HIGH (ref 70–99)
Glucose-Capillary: 162 mg/dL — ABNORMAL HIGH (ref 70–99)
Glucose-Capillary: 165 mg/dL — ABNORMAL HIGH (ref 70–99)
Glucose-Capillary: 173 mg/dL — ABNORMAL HIGH (ref 70–99)
Glucose-Capillary: 175 mg/dL — ABNORMAL HIGH (ref 70–99)

## 2020-12-11 LAB — PHOSPHORUS
Phosphorus: 1.3 mg/dL — ABNORMAL LOW (ref 2.5–4.6)
Phosphorus: 3.1 mg/dL (ref 2.5–4.6)

## 2020-12-11 LAB — MAGNESIUM: Magnesium: 2.5 mg/dL — ABNORMAL HIGH (ref 1.7–2.4)

## 2020-12-11 MED ORDER — POTASSIUM CHLORIDE 20 MEQ PO PACK
20.0000 meq | PACK | Freq: Once | ORAL | Status: AC
  Administered 2020-12-11: 20 meq
  Filled 2020-12-11: qty 1

## 2020-12-11 MED ORDER — VANCOMYCIN VARIABLE DOSE PER UNSTABLE RENAL FUNCTION (PHARMACIST DOSING): Status: DC

## 2020-12-11 MED ORDER — POTASSIUM PHOSPHATES 15 MMOLE/5ML IV SOLN
45.0000 mmol | Freq: Once | INTRAVENOUS | Status: AC
  Administered 2020-12-11: 45 mmol via INTRAVENOUS
  Filled 2020-12-11: qty 15

## 2020-12-11 MED ORDER — VANCOMYCIN HCL 1000 MG/200ML IV SOLN
1000.0000 mg | Freq: Two times a day (BID) | INTRAVENOUS | Status: DC
  Administered 2020-12-11: 1000 mg via INTRAVENOUS
  Filled 2020-12-11 (×3): qty 200

## 2020-12-11 MED ORDER — SODIUM CHLORIDE 0.9 % IV SOLN
2.0000 g | Freq: Three times a day (TID) | INTRAVENOUS | Status: DC
  Administered 2020-12-12 – 2021-01-05 (×74): 2 g via INTRAVENOUS
  Filled 2020-12-11 (×78): qty 2

## 2020-12-11 NOTE — Progress Notes (Signed)
Nutrition Follow-up  DOCUMENTATION CODES:   Not applicable  INTERVENTION:   -Continue TF via PEG:   Pivot 1.5 @ 73m/hr (1680 ml/ day)  D/c 45 ml Prosource TF BID  Free water flushes 2031mevery 2 hours per MD  Regimen provides 22640 kcal/day, 142 g/day protein and 22400 ml/day free water. Total free water: 3660 ml daily.   -Continue 1 packet Juven BID via g-tube, each packet provides 95 calories, 2.5 grams of protein (collagen), and 9.8 grams of carbohydrate (3 grams sugar); also contains 7 grams of L-arginine and L-glutamine, 300 mg vitamin C, 15 mg vitamin E, 1.2 mcg vitamin B-12, 9.5 mg zinc, 200 mg calcium, and 1.5 g  Calcium Beta-hydroxy-Beta-methylbutyrate to support wound healing  NUTRITION DIAGNOSIS:   Inadequate oral intake related to dysphagia as evidenced by NPO status (pt with chronic PEG).  Ongoing  GOAL:   Provide needs based on ASPEN/SCCM guidelines  Met with TF  MONITOR:   Vent status,Labs,Weight trends,Skin,I & O's  REASON FOR ASSESSMENT:   Consult Enteral/tube feeding initiation and management  ASSESSMENT:   [age]/o male with h/o HTN, quadriplegia secondary to fall and chronic tracheostomy and PEG tube who is admitted with UTI, sepsis and mucous plugging  3/19- TF initiated  Patient is currently intubated on ventilator support MV: 12.5 L/min Temp (24hrs), Avg:99 F (37.2 C), Min:94.64 F (34.8 C), Max:101.8 F (38.8 C)   Reviewed I/O's: -1.6 L x 24 hours and -3.8 L since admission  UOP: 2.3 L x 24 hours  Case discussed in ICU rounds. Per RN, pt tolerating TF well. Pt on free water flushes secondary to hypernatremia.    Spoke with pt wife at bedside, who reports that pt is tolerating TF well. Pt complains on abdominal pain- per pt wife, pt has not had a BM in a few days.   Medications reviewed and include fibercon, florastor, senokot, dextrose 5% solution @ 30 ml/hr, levophed, and zinc sulfate.   Labs reviewed: Na: 154, Phos: 1.3,  Mg: 2.5, CBGS: 157-175.   Diet Order:   Diet Order            Diet NPO time specified  Diet effective now                 EDUCATION NEEDS:   No education needs have been identified at this time  Skin:  Skin Assessment: Reviewed RN Assessment (Right gluteal, extends to hip: 1 cm x 1 cm x 0.2 cm, Sacrum: 2.5 cm x 2 cm x 1.5 cm, Right heel:  2.7 cm x 2.4 cm x 0.1 cm, Left heel: 4.5 cm x 5.6 cm x 0.1 cm)  Last BM:  12/10/20  Height:   Ht Readings from Last 1 Encounters:  12/09/20 6' 5.01" (1.956 m)    Weight:   Wt Readings from Last 1 Encounters:  12/11/20 124.9 kg    Ideal Body Weight:  94.5 kg  BMI:  Body mass index is 32.65 kg/m.  Estimated Nutritional Needs:   Kcal:  2205  Protein:  140-165 grams  Fluid:  2.6-2.9L/day    JeLoistine ChanceRD, LDN, CDBroaddusegistered Dietitian II Certified Diabetes Care and Education Specialist Please refer to AMCampbellton-Graceville Hospitalor RD and/or RD on-call/weekend/after hours pager

## 2020-12-11 NOTE — Progress Notes (Addendum)
Pharmacy Antibiotic Note  Ryan Day is a 70 y.o. male admitted on 12/07/2020 with sepsis. Pt presented with complaints of recurrent UTI and hematuria with foley cath last placed ~2 weeks ago, exchanged in ED. 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 Pt has PEG and trache. Pt admitted 11/22-12/5 for HCAP and sacral ulcer with osteomyelitis. Hx of enterobacter cloacae, pseudomonas in ucx. Pt has received vanc, Unasyn, Zosyn, and cefepime on recent previous admissions. Surgery & ID consulted. Pharmacy has been consulted for meropenem and Vancomycin dosing. Tmax 101.8, Tmin 82.0 in the last 24 hours. Pt is hypotensive. Lactic trending down and procal still elevated.   Plan: --Continue meropenem 1 g q8h --Vanc random 3/21 @ 0547: 14, SCr is trending down, UOP improving. Will give vancomycin 1000 mg x 1 dose --F/u random level 3/22 @ 0500 --Continue to monitor creatinine while on vancomycin. --Current abx regimen will cover PSA, MRSA, enterobacter cloacae, and enterococcus faecalis, f/u with sensitivities of cultures listed below and narrow as indicated.   Height: 6' 5.01" (195.6 cm) Weight: 124.9 kg (275 lb 5.7 oz) IBW/kg (Calculated) : 89.12  Temp (24hrs), Avg:98.5 F (36.9 C), Min:82.04 F (27.8 C), Max:101.8 F (38.8 C)  Recent Labs  Lab 12/07/20 1725 12/07/20 1933 12/08/20 0450 12/08/20 0921 12/08/20 1151 12/08/20 1927 12/08/20 2337 12/09/20 0454 12/09/20 0454 12/10/20 0513 12/10/20 0652 12/11/20 0547  WBC 2.0*  --  17.6*  --   --   --   --  15.2*  --  15.7*  --  11.2*  CREATININE 1.02  --  1.87*  --   --   --   --  1.66*  --  1.20  --  1.09  LATICACIDVEN 2.8*   < >  --  7.3* 7.4* 5.1* 3.3* 2.3*  --   --   --   --   VANCORANDOM  --   --   --   --   --   --   --  20   < >  --  23 14   < > = values in this interval not displayed.    Estimated Creatinine Clearance: 94.9 mL/min (by C-G formula based on SCr of 1.09 mg/dL).    Allergies   Allergen Reactions  . Shrimp (Diagnostic)     Experiences facial droop when eating seafood    Antimicrobials this admission: 3/17 vancomycin  >>  3/17 cefepime  >> 3/20 3/17 Zosyn x 1  3/20 meropenem >>  Dose adjustments this admission: -Vancomycin 1573m q12h > 10043mq12h  Microbiology results: 3/17 BCx: e. Cloacae, MRSA, e. Faecalis 3/17 UCx: e. cloacae 3/18 resp cx: pseudomonas aeruginosa 3/18 BAL: Pseudomonas aeruginosa, staph aureus, e. faecalis 3/17 MRSA PCR: positive  Thank you for allowing pharmacy to be a part of this patient's care.  MoBenn MoulderPharmD Pharmacy Resident  12/11/2020 8:22 AM

## 2020-12-11 NOTE — Consult Note (Signed)
[place] for Electrolyte Monitoring and Replacement   Recent Labs: Potassium (mmol/L)  Date Value  12/11/2020 3.4 (L)   Magnesium (mg/dL)  Date Value  12/11/2020 2.5 (H)   Calcium (mg/dL)  Date Value  12/11/2020 9.1   Albumin (g/dL)  Date Value  12/07/2020 3.1 (L)  02/01/2020 4.4   Phosphorus (mg/dL)  Date Value  12/11/2020 1.3 (L)   Sodium (mmol/L)  Date Value  12/11/2020 154 (H)  02/01/2020 143   Assessment: Patient is a 70 y/o M presented with sepsis. Pt presented with complaints of recurrent UTI and hematuria. 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 Pt has chronic tracheostomy and PEG tube. PMH includes HTN and HLD. Pharmacy has been consulted to assist with electrolyte monitoring and replacement as indicated. Scr trending down 1.87 > 1.66>1.09.   Nutrition: tube feeds BID + continuous @ 70 mL/hr   MIVF: D5 @ 30 mL/hr  Goal of Therapy:  Electrolytes within normal limits  Plan:  --Na 154 - continue free water 200 mL per tube q2h and D5 MIVF --K 3.4 - ordered KCl 20 mEq PO x 1 dose  --Phos 1.3 - NP ordered 45 mmol x 1 dose --Follow-up electrolytes with AM labs  Benn Moulder, PharmD Pharmacy Resident  12/11/2020 11:59 AM

## 2020-12-11 NOTE — Progress Notes (Signed)
NAME:  Ryan Day, MRN:  [id number], DOB:  [birth year], LOS: 4 ADMISSION DATE:  12/07/2020, CONSULTATION DATE: 12/07/2020 REFERRING MD: Dr. 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  According to pts family last week pt noted to have sediment in his urine at home concerning for UTI.  He had a foley catheter placed 2 weeks ago and completed a 7 day course of abx therapy due to UTI.  Due to concerns of recurrent UTI pts family contacted pts PCP office last week, and heard from his PCP this week and were awaiting urine culture results.  However, pt noted to have hematuria yesterday and today along with sinus tachycardia hr 120's.  Pts daughter is a Marine scientist and due to concern of sepsis pt transported to the ER via EMS for further evaluation and treatment.    ED course Upon arrival to the ER pts O2 sats were 75-80% on RA, therefore pt placed on NRB with O2 sats initially increasing to 100%.  Pt transitioned to trach collar with initial O2 sats in the low 90's.  However, pt noted to have mucous plugging resulting in hypoxia requiring exchange of tracheostomy from a cuffless trach to a size 6 mm cuffed trach and pt placed on mechanical ventilation.  ER vital signs were: temp 101.7 rectally, sbp 60-80's, and hr 130's. Lab results revealed Na+ 153, chloride 119, glucose 147, lactic acid 2.8, wbc 2.0, hbg 11.1, and UA positive for UTI.  CXR negative and COVID-19/Influenza PCR results pending.  Sepsis protocol initiated and pt received cefepime, vancomycin, and 2L LR bolus.  Pt remained severely hypotensive requiring levophed gtt and right femoral central line placement.  PCCM team contacted for ICU admission.    12/08/20- patient is improved some he is communicative.  Wife [name] at bedside we reviewed care plan.   12/10/20- patient is improved he is down to 77mg on levophed and 30%Fio2  on ventilator, mentating well. Reviewed careplan with wife LVaughan Bastatoday.   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 C5 Cervical Fracture (following a mechanical fall at home) s/p C3-6 Laminectomy and Posterior Instrumentation and Fusion 08/4  Quadriplegia  Spinal Cord Compression  Chronic Tracheostomy and PEG    Significant Hospital Events: Including procedures, antibiotic start and stop dates in addition to other pertinent events   03/17: Pt admitted to ICU with urosepsis requiring vasopressors and acute on chronic hypoxic respiratory failure secondary to mucous plugging requiring exchange of cuffless trach to size 6 mm cuffed trach to be placed on mechanical ventilation  03/17: Right femoral central line placed by ER physician  03/17: CT Abd Pelvis findings with cystitis. Correlation with urinalysis is Recommended. Mild to moderate severity bibasilar atelectasis and/or Infiltrate. Sigmoid diverticulosis. Sacral decubitus ulcer, as described above. MRI correlation is recommended, as sequelae associated with acute osteomyelitis cannot be excluded. Aortic atherosclerosis. 03/17: CT Head revealed mild chronic ischemic white matter disease. No acute intracranial abnormality seen 03/17: Vancomycin x1 dose and Cefepime x1 dose 03/17: Zosyn>>  Interim History / Subjective:  Pt lethargic no signs of respiratory distress mechanically intubated via tracheostomy   Objective   Blood pressure (!) 109/55, pulse (!) 41, temperature 99.5 F (37.5 C), resp. rate (!) 31, height 6' 5.01" (1.956 m), weight 124.9 kg, SpO2 95 %.    Vent Mode: PSV  FiO2 (%):  [30 %-60 %] 30 % Set Rate:  [24 bmp] 24 bmp Vt Set:  [500 mL-5000 mL] 500 mL PEEP:  [5 cmH20-8 cmH20] 5 cmH20 Pressure Support:  [8 cmH20] 8 cmH20 Plateau Pressure:  [25 cmH20] 25 cmH20   Intake/Output Summary (Last 24 hours) at 12/11/2020 8366 Last data filed at 12/11/2020  0800 Gross per 24 hour  Intake 661.09 ml  Output 2350 ml  Net -1688.91 ml   Filed Weights   12/09/20 0500 12/10/20 0500 12/11/20 0500  Weight: 125.2 kg 125.1 kg 124.9 kg   Examination: General: chronically appearing male, NAD receiving mechanical ventilation via trach  HENT: supple, no JVD  Lungs: diminished throughout, even, non labored  Cardiovascular: sinus tach, no R/G, 2+ radial/1+ distal pulses, 2+ bilateral lower extremity edema  Abdomen: hypoactive BS x, soft, non distended  Extremities: quadriplegic Neuro: communicates to verbal language Skin: sacral spine decubitus  GU: chronic foley in place   Labs/imaging personally reviewed   CT Abd/Pelvis CXR CT Head  UA  Resolved Hospital Problem list   N/A  Assessment & Plan:   RESPIRATORY:  Acute on chronic hypoxic respiratory failure secondary to mucous plugging which is chronic   Mechanical ventilation via chronic tracheostomy        S/p bronch - many GPCs , cleared bilateral airways   Full vent support for now-vent settings reviewed and established  SBT once all parameters met  VAP bundle implemented   Aggressive pulmonary hygiene   CARDIOVASCULAR:  ECHO  1.EF 55 to 60%. LVH G2DD  2. RV size and function normal   3. The mitral valve is normal in structure. No MR   4. The aortic valve is grossly normal. No AR   Septic shock  Continuous telemetry monitoring   Aggressive fluid resuscitation and levophed/vasopressin gtts to maintain map >65  RENAL: Lab Results  Component Value Date   CREATININE 1.09 12/11/2020   BUN 44 (H) 12/11/2020   NA 154 (H) 12/11/2020   K 3.4 (L) 12/11/2020   CL 126 (H) 12/11/2020   CO2 25 12/11/2020    Intake/Output Summary (Last 24 hours) at 12/11/2020 0904 Last data filed at 12/11/2020 0800 Gross per 24 hour  Intake 661.09 ml  Output 2350 ml  Net -1688.91 ml   Net IO Since Admission: -4,154.61 mL [12/11/20 0904]   Mild acute renal failure secondary to  ATN  Anion Gap Metabolic Acidosis, Severe lactic acidosis   Trend BMP and lactic acid, gap resolved, lactic 2.3  Replace electrolytes as indicated   Monitor UOP  Avoid nephrotoxic medications   ID: Lab Results  Component Value Date   WBC 11.2 (H) 12/11/2020    Urosepsis secondary to chronic foley   Leukopenia  Septic Shock   Trend WBC and monitor fever curve   PCT skyrocketed >150 from 3/18, re-check  BAL:  >=100,000 COLONIES/mL PSEUDOMONAS AERUGINOSA  40,000 COLONIES/mL STAPHYLOCOCCUS AUREUS  20,000 COLONIES/mL ENTEROCOCCUS FAECALIS  CULTURE REINCUBATED FOR BETTER GROWTH   BLOOD   Enterobacter cloacae Enterococcus faecalis    MIC MIC    AMPICILLIN   <=2 SENSITIVE  Sensitive    CEFAZOLIN >=64 RESIST... Resistant      CEFEPIME 0.5 SENSITIVE  Sensitive      CEFTAZIDIME 2 SENSITIVE  Sensitive      CIPROFLOXACIN >=4 RESISTANT  Resistant      GENTAMICIN <=1 SENSITIVE  Sensitive      GENTAMICIN SYNERGY   SENSITIVE  Sensitive    IMIPENEM 0.5  SENSITIVE  Sensitive      PIP/TAZO 32 INTERMED... Intermediate      TRIMETH/SULFA <=20 SENSIT... Sensitive      VANCOMYCIN   1 SENSITIVE  Sensitive       Resistance pattern of the Pseudomonas vs Enterobacter, switch to Merrem   Foley catheter exchanged in the ED  Sacral spine pressure ulcer-pt previously had wound vac in place with removal   Turn q2hrs   Wound Care consulted appreciate input   HEME: Lab Results  Component Value Date   HCT 23.6 (L) 12/11/2020    Anemia without obvious acute blood loss  Trend CBC   Monitor for s/sx of bleeding  Transfuse for hgb <7  NEURO:  Quadriplegia-bed bound  Acute toxic encephalopathy   Frequent reorientation   Will attempt to avoid sedating medications   Best practice (evaluated daily)  Diet:  NPO Pain/Anxiety/Delirium protocol (if indicated): Yes (RASS goal 0) VAP protocol (if indicated): Yes DVT prophylaxis: Systemic AC GI prophylaxis: H2B Glucose  control:  SSI No Central venous access:  Yes, and it is still needed Arterial line:  N/A Foley:  Yes, and it is still needed Mobility:  bed rest  PT consulted: N/A Last date of multidisciplinary goals of care discussion [N/A] Code Status:  full code Disposition: ICU   Labs   CBC: Recent Labs  Lab 12/07/20 1725 12/08/20 0450 12/09/20 0454 12/10/20 0513 12/11/20 0547  WBC 2.0* 17.6* 15.2* 15.7* 11.2*  NEUTROABS 1.3*  --   --   --   --   HGB 11.1* 9.7* 8.2* 7.6* 7.5*  HCT 36.1* 30.5* 26.3* 25.0* 23.6*  MCV 95.3 95.3 96.0 95.4 93.7  PLT 213 181 133* 126* 110*    Basic Metabolic Panel: Recent Labs  Lab 12/07/20 1725 12/08/20 0450 12/08/20 1927 12/09/20 0454 12/09/20 1538 12/10/20 0513 12/10/20 1548 12/11/20 0547  NA 153* 150*   < > 147* 153* 155* 155* 154*  K 3.5 3.8  --  3.1* 3.5 3.2* 3.1* 3.4*  CL 119* 115*  --  116*  --  124*  --  126*  CO2 26 23  --  24  --  23  --  25  GLUCOSE 147* 110*  --  114*  --  132*  --  170*  BUN 52* 56*  --  54*  --  45*  --  44*  CREATININE 1.02 1.87*  --  1.66*  --  1.20  --  1.09  CALCIUM 10.2 9.5  --  9.0  --  9.3  --  9.1  MG  --  2.1  --  2.2  --  2.5*  --  2.5*  PHOS  --  4.3  --  4.1  --  2.0*  --  1.3*   < > = values in this interval not displayed.   GFR: Estimated Creatinine Clearance: 94.9 mL/min (by C-G formula based on SCr of 1.09 mg/dL). Recent Labs  Lab 12/07/20 1725 12/07/20 1933 12/08/20 0450 12/08/20 0921 12/08/20 1151 12/08/20 1927 12/08/20 2337 12/09/20 0454 12/10/20 0513 12/11/20 0547  PROCALCITON 0.23  --  >150.00  --   --   --   --  >150.00  --   --   WBC 2.0*  --  17.6*  --   --   --   --  15.2* 15.7* 11.2*  LATICACIDVEN 2.8*   < >  --    < > 7.4* 5.1* 3.3* 2.3*  --   --    < > =  values in this interval not displayed.    Liver Function Tests: Recent Labs  Lab 12/07/20 1725  AST 32  ALT 26  ALKPHOS 179*  BILITOT 1.2  PROT 8.4*  ALBUMIN 3.1*   No results for input(s): LIPASE, AMYLASE in  the last 168 hours. No results for input(s): AMMONIA in the last 168 hours.  ABG    Component Value Date/Time   PHART 7.38 12/08/2020 0500   PCO2ART 42 12/08/2020 0500   PO2ART 115 (H) 12/08/2020 0500   HCO3 24.8 12/08/2020 0500   ACIDBASEDEF 0.4 12/08/2020 0500   O2SAT 98.4 12/08/2020 0500     Coagulation Profile: No results for input(s): INR, PROTIME in the last 168 hours.  Cardiac Enzymes: No results for input(s): CKTOTAL, CKMB, CKMBINDEX, TROPONINI in the last 168 hours.  HbA1C: Hgb A1c MFr Bld  Date/Time Value Ref Range Status  06/09/2020 07:00 AM 5.8 (H) 4.8 - 5.6 % Final    Comment:    (NOTE) Pre diabetes:          5.7%-6.4%  Diabetes:              >6.4%  Glycemic control for   <7.0% adults with diabetes     CBG: Recent Labs  Lab 12/10/20 1542 12/10/20 1953 12/10/20 2356 12/11/20 0438 12/11/20 0729  GLUCAP 141* 157* 175* 157* 173*    Review of Systems:   Unable to assess pt chronically trached currently requiring mechanical ventilation   Past Medical History:  He,  has a past medical history of Acute on chronic respiratory failure with hypoxia ([place]), Acute renal injury due to hypovolemia [facility]), Autonomic instability, Cardiac arrest ([place]), Cervical spinal cord injury, sequela ([place]), Elevated alkaline phosphatase level, 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   Surgical History:   Past Surgical History:  Procedure Laterality Date  . APPLICATION OF WOUND VAC N/A 08/16/2020   Procedure: APPLICATION OF WOUND VAC;  Surgeon: Fredirick Maudlin, MD;  Location: [facility] ORS;  Service: General;  Laterality: N/A;  . COLONOSCOPY WITH PROPOFOL N/A 12/05/2017   Procedure: COLONOSCOPY WITH PROPOFOL;  Surgeon: Lin Landsman, MD;  Location: [facility] ENDOSCOPY;  Service: Gastroenterology;  Laterality: N/A;  . ESOPHAGOGASTRODUODENOSCOPY  12/05/2017   Procedure: ESOPHAGOGASTRODUODENOSCOPY (EGD);  Surgeon: Lin Landsman, MD;   Location: [place];  Service: Gastroenterology;;  . INCISION AND DRAINAGE ABSCESS N/A 08/16/2020   Procedure: INCISION AND DRAINAGE ABSCESS, SACRAL;  Surgeon: Fredirick Maudlin, MD;  Location: [facility] ORS;  Service: General;  Laterality: N/A;     Social History:   reports that he has never smoked. He has never used smokeless tobacco. He reports previous alcohol use. He reports that he does not use drugs.   Family History:  His family history includes Alcohol abuse in his brother; Cancer in his brother and father; Heart disease (age of onset: 90) in his brother; Liver cancer in his brother.   Allergies Allergies  Allergen Reactions  . Shrimp (Diagnostic)     Experiences facial droop when eating seafood     Home Medications  Prior to Admission medications   Medication Sig Start Date End Date Taking? Authorizing Provider  acetaminophen (TYLENOL) 160 MG/5ML solution Place 10-20 mLs (320-640 mg total) into feeding tube every 4 (four) hours as needed for mild pain. 07/18/20   Love, Ivan Anchors, PA-C  ascorbic acid (VITAMIN C) 500 MG tablet Place 1 tablet (500 mg total) into feeding tube 2 (two) times daily. 07/18/20   Love, Ivan Anchors,  PA-C  baclofen (LIORESAL) 10 MG tablet Take 1 tablet (10 mg total) by mouth 3 (three) times daily. 11/12/20   Johnson, Megan P, DO  bisacodyl (DULCOLAX) 10 MG suppository Place 1 suppository (10 mg total) rectally at bedtime. 07/20/20   Love, Ivan Anchors, PA-C  chlorhexidine (PERIDEX) 0.12 % solution 15 mLs by Mouth Rinse route 2 (two) times daily. 07/20/20   Love, Ivan Anchors, PA-C  collagenase (SANTYL) ointment Apply topically daily. Patient not taking: Reported on 11/07/2020 07/20/20   Love, Ivan Anchors, PA-C  diclofenac Sodium (VOLTAREN) 1 % GEL Apply 4 g topically 4 (four) times daily. 08/31/20   Kathrine Haddock, NP  famotidine (PEPCID) 20 MG tablet Place 1 tablet (20 mg total) into feeding tube daily. 07/20/20   Love, Ivan Anchors, PA-C  ferrous sulfate 300 (60 Fe) MG/5ML  syrup Place 1.3 mLs (78 mg total) into feeding tube daily. 07/20/20   Love, Ivan Anchors, PA-C  fludrocortisone (FLORINEF) 0.1 MG tablet Take 200 mcg by mouth daily. 11/02/20   [provider]  FLUoxetine (PROZAC) 20 MG/5ML solution Place 10 mLs (40 mg total) into feeding tube daily. 11/22/20   Vigg, Avanti, MD  guaiFENesin 200 MG tablet Place 2 tablets (400 mg total) into feeding tube every 6 (six) hours. 07/20/20   Love, Ivan Anchors, PA-C  lidocaine (LIDODERM) 5 % Place 1 patch onto the skin daily. Remove & Discard patch within 12 hours or as directed by MD 07/20/20   Love, Ivan Anchors, PA-C  midodrine (PROAMATINE) 10 MG tablet Place 1 tablet (10 mg total) into feeding tube 3 (three) times daily with meals. 11/04/20   Park Liter P, DO  Mouthwashes (MOUTH RINSE) LIQD solution 15 mLs by Mouth Rinse route 2 times daily at 12 noon and 4 pm. 07/20/20   Love, Ivan Anchors, PA-C  Multiple Vitamin (MULTIVITAMIN) LIQD Place 5 mLs into feeding tube daily. 07/20/20   Love, Ivan Anchors, PA-C  nutrition supplement, JUVEN, (JUVEN) PACK Place 1 packet into feeding tube 2 (two) times daily between meals. 07/20/20   Love, Ivan Anchors, PA-C  Nutritional Supplements (FEEDING SUPPLEMENT, OSMOLITE 1.5 CAL,) LIQD Place 474 mLs into feeding tube 4 (four) times daily. 07/20/20   Love, Ivan Anchors, PA-C  Nutritional Supplements (FEEDING SUPPLEMENT, PROSOURCE TF,) liquid Place 45 mLs into feeding tube 2 (two) times daily. 07/20/20   Love, Ivan Anchors, PA-C  ondansetron (ZOFRAN) 4 MG tablet Place 1 tablet (4 mg total) into feeding tube every 8 (eight) hours as needed for nausea, vomiting or refractory nausea / vomiting. 07/20/20   Love, Ivan Anchors, PA-C  polycarbophil (FIBERCON) 625 MG tablet Place 1 tablet (625 mg total) into feeding tube daily. 07/20/20   Love, Ivan Anchors, PA-C  rivaroxaban (XARELTO) 10 MG TABS tablet Take one tablet per tube daily 08/08/20   [name], MD  saccharomyces boulardii (FLORASTOR) 250 MG capsule Place 1 capsule  (250 mg total) into feeding tube 2 (two) times daily. 07/20/20   Love, Ivan Anchors, PA-C  sennosides (SENOKOT) 8.8 MG/5ML syrup Place 5 mLs into feeding tube daily at 6 (six) AM. 07/20/20   Love, Ivan Anchors, PA-C  simethicone (MYLICON) 40 KP/5.4SF drops Place 1.2 mLs (80 mg total) into feeding tube 4 (four) times daily. 07/20/20   Love, Ivan Anchors, PA-C  traZODone (DESYREL) 50 MG tablet Place 1 tablet (50 mg total) into feeding tube at bedtime. 10/23/20   Jon Billings, NP  Water For Irrigation, Sterile (FREE WATER) SOLN Place 400 mLs into  feeding tube every 4 (four) hours. Patient taking differently: Place 300 mLs into feeding tube every 4 (four) hours. 07/20/20   Love, Ivan Anchors, PA-C  zinc sulfate 220 (50 Zn) MG capsule Place 1 capsule (220 mg total) into feeding tube daily. 07/20/20   Bary Leriche, PA-C         Critical care provider statement:    Critical care time (minutes):  45   Critical care time was exclusive of:  Separately billable procedures and  treating other patients   Critical care was necessary to treat or prevent imminent or  life-threatening deterioration of the following conditions:  mucu plugging, acute respiratory distress with hypoxemia, possible sepsis   Critical care was time spent personally by me on the following  activities:  Development of treatment plan with patient or surrogate,  discussions with consultants, evaluation of patient's response to  treatment, examination of patient, obtaining history from patient or  surrogate, ordering and performing treatments and interventions, ordering  and review of laboratory studies and re-evaluation of patient's condition   I assumed direction of critical care for this patient from another  provider in my specialty: no

## 2020-12-11 NOTE — Progress Notes (Signed)
Date of Admission:  12/07/2020      Subjective: Pt has tracheostpmy Weak Feeble speech Plenty of secretions   Medications:  . acetylcysteine  4 mL Nebulization TID  . albuterol  2.5 mg Nebulization TID  . ascorbic acid  500 mg Per Tube BID  . bisacodyl  10 mg Rectal QHS  . chlorhexidine gluconate (MEDLINE KIT)  15 mL Mouth Rinse BID  . Chlorhexidine Gluconate Cloth  6 each Topical Daily  . diclofenac Sodium  4 g Topical QID  . ferrous sulfate  75 mg Per Tube Daily  . fludrocortisone  200 mcg Per Tube Daily  . FLUoxetine  40 mg Per Tube Daily  . free water  200 mL Per Tube Q2H  . guaiFENesin  400 mg Per Tube Q6H  . mouth rinse  15 mL Mouth Rinse 10 times per day  . midodrine  10 mg Per Tube TID WC  . mupirocin ointment  1 application Nasal BID  . nutrition supplement (JUVEN)  1 packet Per Tube BID BM  . polycarbophil  625 mg Per Tube Daily  . rivaroxaban  10 mg Per Tube Daily  . saccharomyces boulardii  250 mg Per Tube BID  . sennosides  5 mL Per Tube Q0600  . simethicone  80 mg Per Tube QID  . vancomycin variable dose per unstable renal function (pharmacist dosing)   Does not apply See admin instructions  . zinc sulfate  220 mg Per Tube Daily    Objective: Vital signs in last 24 hours: Temp:  [97.7 F (36.5 C)-101.8 F (38.8 C)] 99.68 F (37.6 C) (03/21 1800) Pulse Rate:  [41-103] 52 (03/21 1800) Resp:  [17-34] 25 (03/21 1800) BP: (83-174)/(47-92) 121/68 (03/21 1800) SpO2:  [91 %-97 %] 91 % (03/21 1800) FiO2 (%):  [30 %-40 %] 35 % (03/21 1600) Weight:  [124.9 kg] 124.9 kg (03/21 0500)  PHYSICAL EXAM:  General: awake, letahrgic Lungs: b/l rhonchi Heart: Tachycardia Abdomen: Soft, peg foley Extremities: edematous          Lymph: Cervical, supraclavicular normal. Neurologic: quadriplegia  Lab Results Recent Labs    12/10/20 0513 12/10/20 1548 12/11/20 0547  WBC 15.7*  --  11.2*  HGB 7.6*  --  7.5*  HCT 25.0*  --  23.6*  NA 155* 155* 154*   K 3.2* 3.1* 3.4*  CL 124*  --  126*  CO2 23  --  25  BUN 45*  --  44*  CREATININE 1.20  --  1.09   Liver Panel No results for input(s): PROT, ALBUMIN, AST, ALT, ALKPHOS, BILITOT, BILIDIR, IBILI in the last 72 hours. Sedimentation Rate No results for input(s): ESRSEDRATE in the last 72 hours. C-Reactive Protein No results for input(s): CRP in the last 72 hours.  Microbiology:  Studies/Results: No results found.   Assessment/Plan: ? Polymicrobial bacteremia with sepsis.  MRSA, Enterococcus faecalis and Enterobacter cloacae positive in blood culture..  Continue vancomycin, cefepime.  Source is likely the pressure ulcers.VS resp VS Urinary tract Sacral decubitus.  We will send a culture Has bilateral heel wounds.  Needs podiatry consultation for possible debridement.  Resp distress- mucous plugs and secretions Colonized with pseudomonas - 2 types one intermediate to imipenem and other intermediate to ceftaz- The one from BAL is susceptible to cefepime- so will continue cefepime and not meropenem  Colonized with pseudomonas in urine  Recent hematuria with UTI due to pseudomonas  AKI likely due to infection / dehydration  Anemia of chronic  disease   Recently treated with 6 weeks of antibiotics for sacral osteomyelitis and stage IV decubitus in December 21/ January 22  Quadriplegia secondary to mechanical fall and fracture of C5 vertebra with cord compression status post decompressive surgery in August 2021 at [facility]  Tracheostomy  PEG   Discussed with care team

## 2020-12-12 ENCOUNTER — Inpatient Hospital Stay: Payer: Medicare Other

## 2020-12-12 ENCOUNTER — Inpatient Hospital Stay: Payer: Self-pay

## 2020-12-12 DIAGNOSIS — L8994 Pressure ulcer of unspecified site, stage 4: Secondary | ICD-10-CM | POA: Diagnosis not present

## 2020-12-12 DIAGNOSIS — J9621 Acute and chronic respiratory failure with hypoxia: Secondary | ICD-10-CM

## 2020-12-12 DIAGNOSIS — J189 Pneumonia, unspecified organism: Secondary | ICD-10-CM

## 2020-12-12 DIAGNOSIS — A4152 Sepsis due to Pseudomonas: Secondary | ICD-10-CM | POA: Diagnosis not present

## 2020-12-12 LAB — CULTURE, BLOOD (ROUTINE X 2)
Special Requests: ADEQUATE
Special Requests: ADEQUATE

## 2020-12-12 LAB — CBC
HCT: 23.9 % — ABNORMAL LOW (ref 39.0–52.0)
Hemoglobin: 7.3 g/dL — ABNORMAL LOW (ref 13.0–17.0)
MCH: 29.1 pg (ref 26.0–34.0)
MCHC: 30.5 g/dL (ref 30.0–36.0)
MCV: 95.2 fL (ref 80.0–100.0)
Platelets: 98 K/uL — ABNORMAL LOW (ref 150–400)
RBC: 2.51 MIL/uL — ABNORMAL LOW (ref 4.22–5.81)
RDW: 16.9 % — ABNORMAL HIGH (ref 11.5–15.5)
WBC: 9.8 K/uL (ref 4.0–10.5)
nRBC: 1.2 % — ABNORMAL HIGH (ref 0.0–0.2)

## 2020-12-12 LAB — CULTURE, BAL-QUANTITATIVE W GRAM STAIN: Culture: >=100000 — AB

## 2020-12-12 LAB — BASIC METABOLIC PANEL
Anion gap: 5 (ref 5–15)
BUN: 40 mg/dL — ABNORMAL HIGH (ref 8–23)
CO2: 28 mmol/L (ref 22–32)
Calcium: 9.1 mg/dL (ref 8.9–10.3)
Chloride: 122 mmol/L — ABNORMAL HIGH (ref 98–111)
Creatinine, Ser: 0.89 mg/dL (ref 0.61–1.24)
GFR, Estimated: >60 mL/min (ref >60)
Glucose, Bld: 167 mg/dL — ABNORMAL HIGH (ref 70–99)
Potassium: 3.6 mmol/L (ref 3.5–5.1)
Sodium: 155 mmol/L — ABNORMAL HIGH (ref 135–145)

## 2020-12-12 LAB — GLUCOSE, CAPILLARY
Glucose-Capillary: 138 mg/dL — ABNORMAL HIGH (ref 70–99)
Glucose-Capillary: 142 mg/dL — ABNORMAL HIGH (ref 70–99)
Glucose-Capillary: 147 mg/dL — ABNORMAL HIGH (ref 70–99)
Glucose-Capillary: 148 mg/dL — ABNORMAL HIGH (ref 70–99)
Glucose-Capillary: 153 mg/dL — ABNORMAL HIGH (ref 70–99)
Glucose-Capillary: 159 mg/dL — ABNORMAL HIGH (ref 70–99)

## 2020-12-12 LAB — PHOSPHORUS: Phosphorus: 2.5 mg/dL (ref 2.5–4.6)

## 2020-12-12 LAB — VANCOMYCIN, RANDOM: Vancomycin Rm: 12

## 2020-12-12 LAB — MAGNESIUM: Magnesium: 2.3 mg/dL (ref 1.7–2.4)

## 2020-12-12 IMAGING — DX DG CHEST 1V PORT
1 series · 1 of 1 positions shown · non-contrast
Comparison: [DATE].

CLINICAL DATA: Acute respiratory failure.

EXAM:
PORTABLE CHEST 1 VIEW

[chest ap]
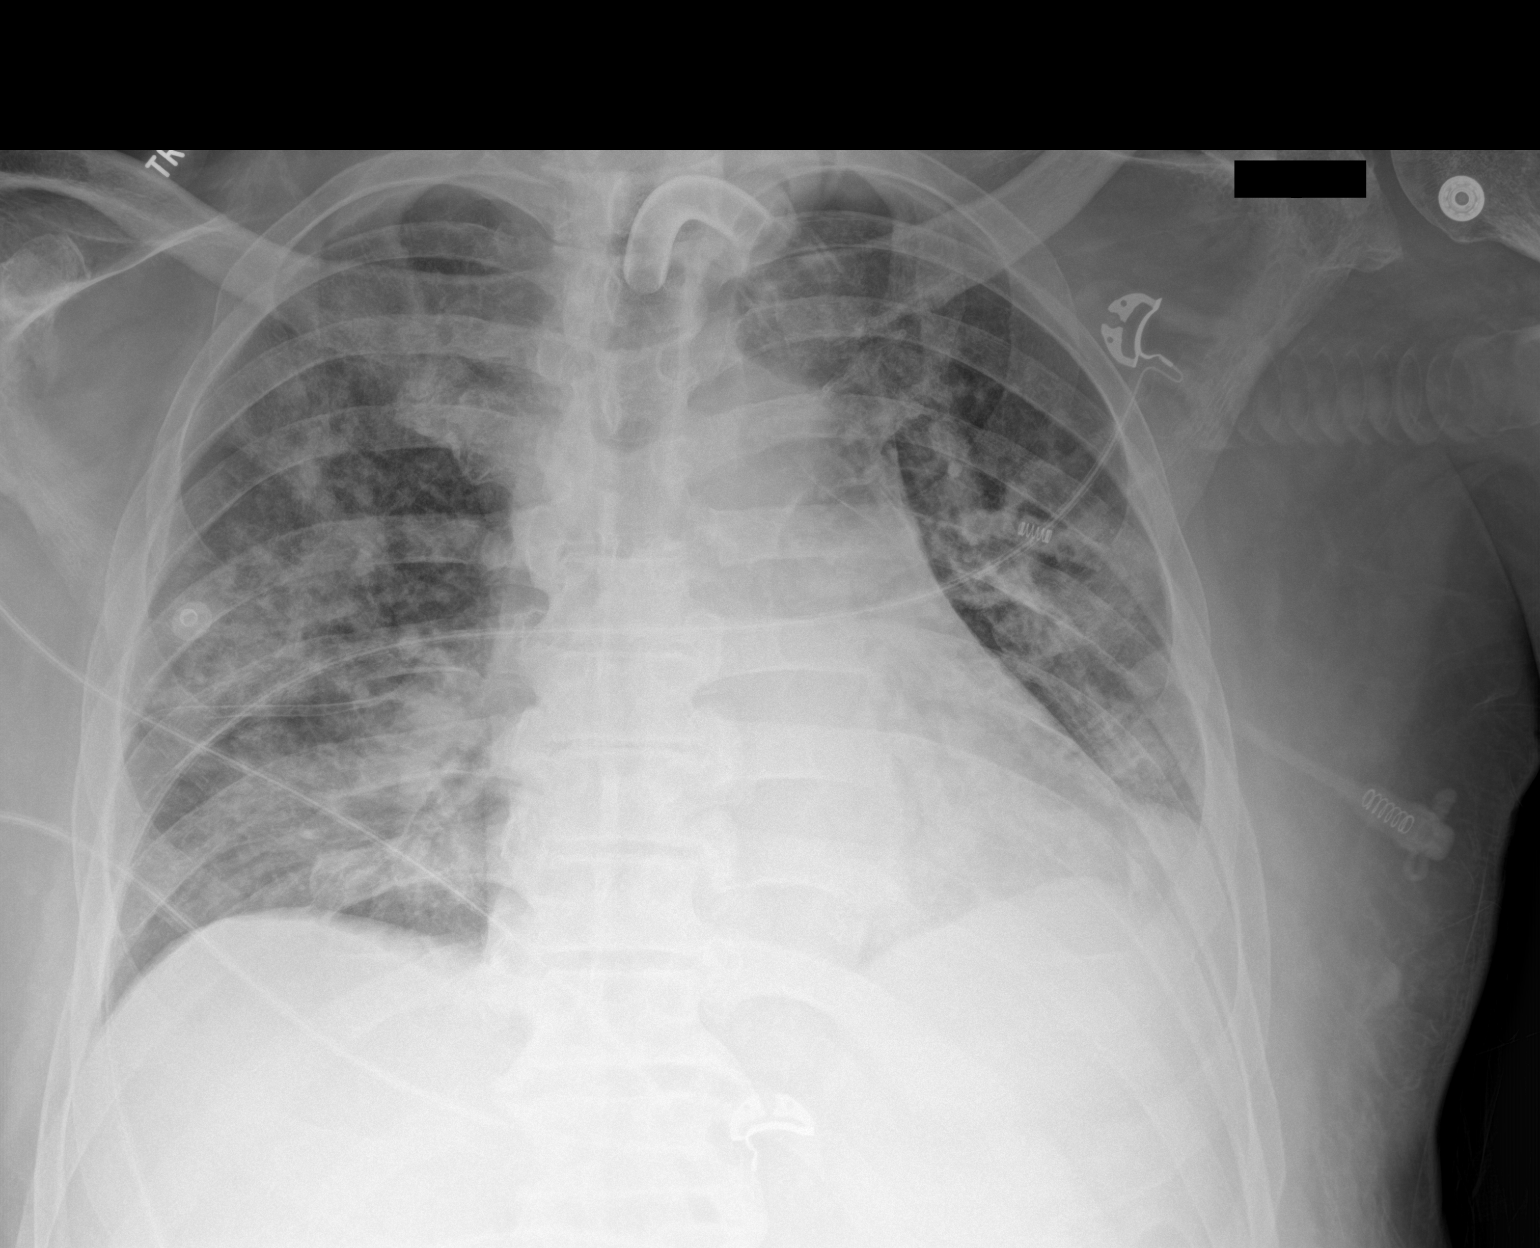

[1 of 1 positions shown; findings below may reference images not displayed]

FINDINGS: Tracheostomy tube in stable position. Stable cardiomegaly. Low lung
volumes with bibasilar atelectasis. Diffuse bilateral pulmonary
infiltrates/edema again noted. Interim progression from prior exam.
Small left pleural effusion. No pneumothorax.
IMPRESSION: 1. Stable cardiomegaly.
2. Low lung volumes with bibasilar atelectasis. Diffuse bilateral
pulmonary infiltrates/edema again noted. Interim progression from
prior exam.

## 2020-12-12 MED ORDER — SODIUM CHLORIDE 0.9% FLUSH
10.0000 mL | Freq: Two times a day (BID) | INTRAVENOUS | Status: DC
  Administered 2020-12-12: 10 mL
  Administered 2020-12-13: 20 mL
  Administered 2020-12-13 – 2020-12-17 (×8): 10 mL
  Administered 2020-12-17: 20 mL

## 2020-12-12 MED ORDER — POTASSIUM CHLORIDE 20 MEQ PO PACK
40.0000 meq | PACK | Freq: Once | ORAL | Status: AC
  Administered 2020-12-12: 40 meq
  Filled 2020-12-12: qty 2

## 2020-12-12 MED ORDER — VANCOMYCIN VARIABLE DOSE PER UNSTABLE RENAL FUNCTION (PHARMACIST DOSING): Status: DC

## 2020-12-12 MED ORDER — PROSOURCE TF PO LIQD: 45.0000 mL | Freq: Two times a day (BID) | ORAL | Status: DC

## 2020-12-12 MED ORDER — CIPROFLOXACIN IN D5W 400 MG/200ML IV SOLN
400.0000 mg | Freq: Two times a day (BID) | INTRAVENOUS | Status: DC
  Filled 2020-12-12 (×2): qty 200

## 2020-12-12 MED ORDER — SODIUM CHLORIDE 0.9% FLUSH: 10.0000 mL | INTRAVENOUS | Status: DC | PRN

## 2020-12-12 MED ORDER — CIPROFLOXACIN IN D5W 400 MG/200ML IV SOLN
400.0000 mg | Freq: Three times a day (TID) | INTRAVENOUS | Status: DC
  Administered 2020-12-12 – 2020-12-21 (×27): 400 mg via INTRAVENOUS
  Filled 2020-12-12 (×31): qty 200

## 2020-12-12 MED ORDER — DESMOPRESSIN ACETATE SPRAY 0.01 % NA SOLN
10.0000 ug | Freq: Two times a day (BID) | NASAL | Status: DC
  Filled 2020-12-12: qty 5

## 2020-12-12 MED ORDER — VANCOMYCIN HCL 1750 MG/350ML IV SOLN
1750.0000 mg | Freq: Once | INTRAVENOUS | Status: AC
  Administered 2020-12-12: 1750 mg via INTRAVENOUS
  Filled 2020-12-12: qty 350

## 2020-12-12 MED ORDER — FUROSEMIDE 10 MG/ML IJ SOLN
40.0000 mg | Freq: Once | INTRAMUSCULAR | Status: AC
  Administered 2020-12-12: 40 mg via INTRAVENOUS
  Filled 2020-12-12: qty 4

## 2020-12-12 MED ORDER — PROSOURCE TF PO LIQD
45.0000 mL | Freq: Every day | ORAL | Status: DC
  Administered 2020-12-13 – 2020-12-26 (×14): 45 mL
  Filled 2020-12-12: qty 45

## 2020-12-12 MED ORDER — DESMOPRESSIN ACETATE 4 MCG/ML IJ SOLN
2.0000 ug | Freq: Two times a day (BID) | INTRAMUSCULAR | Status: DC
  Administered 2020-12-12 – 2020-12-15 (×6): 2 ug via INTRAVENOUS
  Filled 2020-12-12 (×7): qty 1

## 2020-12-12 NOTE — Progress Notes (Signed)
 NAME:  Ryan Day, MRN:  [id number], DOB:  12/13/1950, LOS: 5 ADMISSION DATE:  12/07/2020, CONSULTATION DATE: 12/07/2020 REFERRING MD: Dr. 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  According to pts family last week pt noted to have sediment in his urine at home concerning for UTI.  He had a foley catheter placed 2 weeks ago and completed a 7 day course of abx therapy due to UTI.  Due to concerns of recurrent UTI pts family contacted pts PCP office last week, and heard from his PCP this week and were awaiting urine culture results.  However, pt noted to have hematuria yesterday and today along with sinus tachycardia hr 120's.  Pts daughter is a nurse and due to concern of sepsis pt transported to the ER via EMS for further evaluation and treatment.    ED course Upon arrival to the ER pts O2 sats were 75-80% on RA, therefore pt placed on NRB with O2 sats initially increasing to 100%.  Pt transitioned to trach collar with initial O2 sats in the low 90's.  However, pt noted to have mucous plugging resulting in hypoxia requiring exchange of tracheostomy from a cuffless trach to a size 6 mm cuffed trach and pt placed on mechanical ventilation.  ER vital signs were: temp 101.7 rectally, sbp 60-80's, and hr 130's. Lab results revealed Na+ 153, chloride 119, glucose 147, lactic acid 2.8, wbc 2.0, hbg 11.1, and UA positive for UTI.  CXR negative and COVID-19/Influenza PCR results pending.  Sepsis protocol initiated and pt received cefepime, vancomycin, and 2L LR bolus.  Pt remained severely hypotensive requiring levophed gtt and right femoral central line placement.  PCCM team contacted for ICU admission.    12/08/20- patient is improved some he is communicative.  Wife Linda at bedside we reviewed care plan.   12/10/20- patient is improved he is down to 1mcg on levophed and 30%Fio2  on ventilator, mentating well. Reviewed careplan with wife Linda today.   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 C5 Cervical Fracture (following a mechanical fall at home) s/p C3-6 Laminectomy and Posterior Instrumentation and Fusion 08/4  Quadriplegia  Spinal Cord Compression  Chronic Tracheostomy and PEG    Significant Hospital Events: Including procedures, antibiotic start and stop dates in addition to other pertinent events   03/17: Pt admitted to ICU with urosepsis requiring vasopressors and acute on chronic hypoxic respiratory failure secondary to mucous plugging requiring exchange of cuffless trach to size 6 mm cuffed trach to be placed on mechanical ventilation  03/17: Right femoral central line placed by ER physician  03/17: CT Abd Pelvis findings with cystitis. Correlation with urinalysis is Recommended. Mild to moderate severity bibasilar atelectasis and/or Infiltrate. Sigmoid diverticulosis. Sacral decubitus ulcer, as described above. MRI correlation is recommended, as sequelae associated with acute osteomyelitis cannot be excluded. Aortic atherosclerosis. 03/17: CT Head revealed mild chronic ischemic white matter disease. No acute intracranial abnormality seen 03/17: Vancomycin x1 dose and Cefepime x1 dose 03/17: Zosyn>>  Interim History / Subjective:  Pt lethargic no signs of respiratory distress mechanically intubated via tracheostomy   Objective   Blood pressure 123/61, pulse 79, temperature 97.7 F (36.5 C), resp. rate (!) 46, height 6' 5.01" (1.956 m), weight 124.9 kg, SpO2 98 %.    Vent Mode: PRVC FiO2 (%):  [  30 %-35 %] 30 % Set Rate:  [24 bmp] 24 bmp Vt Set:  [500 mL] 500 mL PEEP:  [5 cmH20-8 cmH20] 8 cmH20 Pressure Support:  [15 cmH20] 15 cmH20 Plateau Pressure:  [20 cmH20-21 cmH20] 20 cmH20   Intake/Output Summary (Last 24 hours) at 12/12/2020 1436 Last data filed at 12/12/2020 1301 Gross  per 24 hour  Intake 1400.2 ml  Output 4575 ml  Net -3174.8 ml   Filed Weights   12/09/20 0500 12/10/20 0500 12/11/20 0500  Weight: 125.2 kg 125.1 kg 124.9 kg   Examination: General: chronically appearing male, NAD receiving mechanical ventilation via trach  HENT: supple, no JVD  Lungs: diminished throughout, even, non labored  Cardiovascular: sinus tach, no R/G, 2+ radial/1+ distal pulses, 2+ bilateral lower extremity edema  Abdomen: hypoactive BS x, soft, non distended  Extremities: quadriplegic Neuro: communicates to verbal language Skin: sacral spine decubitus  GU: chronic foley in place   Labs/imaging personally reviewed   CT Abd/Pelvis CXR CT Head  UA  Resolved Hospital Problem list   N/A  Assessment & Plan:   RESPIRATORY:  Acute on chronic hypoxic respiratory failure secondary to mucous plugging which is chronic   Mechanical ventilation via chronic tracheostomy        S/p bronch - many GPCs , cleared bilateral airways   Full vent support for now-vent settings reviewed and established  SBT once all parameters met  VAP bundle implemented   Aggressive pulmonary hygiene   CARDIOVASCULAR:  ECHO  1.EF 55 to 60%. LVH G2DD  2. RV size and function normal   3. The mitral valve is normal in structure. No MR   4. The aortic valve is grossly normal. No AR   Septic shock  Continuous telemetry monitoring   Aggressive fluid resuscitation and levophed/vasopressin gtts to maintain map >65  RENAL: Lab Results  Component Value Date   CREATININE 0.89 12/12/2020   BUN 40 (H) 12/12/2020   NA 155 (H) 12/12/2020   K 3.6 12/12/2020   CL 122 (H) 12/12/2020   CO2 28 12/12/2020    Intake/Output Summary (Last 24 hours) at 12/12/2020 1436 Last data filed at 12/12/2020 1301 Gross per 24 hour  Intake 1400.2 ml  Output 4575 ml  Net -3174.8 ml   Net IO Since Admission: -7,307.64 mL [12/12/20 1436]   Mild acute renal failure secondary to ATN  Anion Gap Metabolic  Acidosis, Severe lactic acidosis   Trend BMP and lactic acid, gap resolved, lactic 2.3  Replace electrolytes as indicated   Monitor UOP, despite all the free water and D5W  Trial   Avoid nephrotoxic medications   ID: Lab Results  Component Value Date   WBC 9.8 12/12/2020    Urosepsis secondary to chronic foley   Leukopenia  Septic Shock   Trend WBC and monitor fever curve   PCT skyrocketed >150 from 3/18, re-check  Resistance pattern of the Pseudomonas vs Enterobacter, switch to Cefepipme  Appreciate ID consultant   Foley catheter exchanged in the ED  Sacral spine pressure ulcer-pt previously had wound vac in place with removal   Wound Care consulted appreciate input   HEME: Lab Results  Component Value Date   HCT 23.9 (L) 12/12/2020    Anemia without obvious acute blood loss  Trend CBC   Monitor for s/sx of bleeding  Transfuse for hgb <7  NEURO:  Quadriplegia-bed bound  Acute toxic encephalopathy   Frequent reorientation   Will attempt to avoid sedating medications   Best  practice (evaluated daily)  Diet:  NPO Pain/Anxiety/Delirium protocol (if indicated): Yes (RASS goal 0) VAP protocol (if indicated): Yes DVT prophylaxis: Systemic AC GI prophylaxis: H2B Glucose control:  SSI No Central venous access:  Yes, and it is still needed Arterial line:  N/A Foley:  Yes, and it is still needed Mobility:  bed rest  PT consulted: N/A Last date of multidisciplinary goals of care discussion [N/A] Code Status:  full code Disposition: ICU   Labs   CBC: Recent Labs  Lab 12/07/20 1725 12/08/20 0450 12/09/20 0454 12/10/20 0513 12/11/20 0547 12/12/20 0542  WBC 2.0* 17.6* 15.2* 15.7* 11.2* 9.8  NEUTROABS 1.3*  --   --   --   --   --   HGB 11.1* 9.7* 8.2* 7.6* 7.5* 7.3*  HCT 36.1* 30.5* 26.3* 25.0* 23.6* 23.9*  MCV 95.3 95.3 96.0 95.4 93.7 95.2  PLT 213 181 133* 126* 110* 98*    Basic Metabolic Panel: Recent Labs  Lab 12/08/20 0450  12/08/20 1927 12/09/20 0454 12/09/20 1538 12/10/20 0513 12/10/20 1548 12/11/20 0547 12/11/20 1942 12/12/20 0542  NA 150*   < > 147* 153* 155* 155* 154*  --  155*  K 3.8  --  3.1* 3.5 3.2* 3.1* 3.4*  --  3.6  CL 115*  --  116*  --  124*  --  126*  --  122*  CO2 23  --  24  --  23  --  25  --  28  GLUCOSE 110*  --  114*  --  132*  --  170*  --  167*  BUN 56*  --  54*  --  45*  --  44*  --  40*  CREATININE 1.87*  --  1.66*  --  1.20  --  1.09  --  0.89  CALCIUM 9.5  --  9.0  --  9.3  --  9.1  --  9.1  MG 2.1  --  2.2  --  2.5*  --  2.5*  --  2.3  PHOS 4.3  --  4.1  --  2.0*  --  1.3* 3.1 2.5   < > = values in this interval not displayed.   GFR: Estimated Creatinine Clearance: 116.2 mL/min (by C-G formula based on SCr of 0.89 mg/dL). Recent Labs  Lab 12/07/20 1725 12/07/20 1933 12/08/20 0450 12/08/20 0921 12/08/20 1151 12/08/20 1927 12/08/20 2337 12/09/20 0454 12/10/20 0513 12/11/20 0547 12/12/20 0542  PROCALCITON 0.23  --  >150.00  --   --   --   --  >150.00  --   --   --   WBC 2.0*  --  17.6*  --   --   --   --  15.2* 15.7* 11.2* 9.8  LATICACIDVEN 2.8*   < >  --    < > 7.4* 5.1* 3.3* 2.3*  --   --   --    < > = values in this interval not displayed.    Liver Function Tests: Recent Labs  Lab 12/07/20 1725  AST 32  ALT 26  ALKPHOS 179*  BILITOT 1.2  PROT 8.4*  ALBUMIN 3.1*   No results for input(s): LIPASE, AMYLASE in the last 168 hours. No results for input(s): AMMONIA in the last 168 hours.  ABG    Component Value Date/Time   PHART 7.38 12/08/2020 0500   PCO2ART 42 12/08/2020 0500   PO2ART 115 (H) 12/08/2020 0500   HCO3 24.8 12/08/2020 0500     ACIDBASEDEF 0.4 12/08/2020 0500   O2SAT 98.4 12/08/2020 0500     Coagulation Profile: No results for input(s): INR, PROTIME in the last 168 hours.  Cardiac Enzymes: No results for input(s): CKTOTAL, CKMB, CKMBINDEX, TROPONINI in the last 168 hours.  HbA1C: Hgb A1c MFr Bld  Date/Time Value Ref Range Status   06/09/2020 07:00 AM 5.8 (H) 4.8 - 5.6 % Final    Comment:    (NOTE) Pre diabetes:          5.7%-6.4%  Diabetes:              >6.4%  Glycemic control for   <7.0% adults with diabetes     CBG: Recent Labs  Lab 12/11/20 1930 12/11/20 2332 12/12/20 0503 12/12/20 0748 12/12/20 1123  GLUCAP 160* 165* 138* 142* 159*    Review of Systems:   Unable to assess pt chronically trached currently requiring mechanical ventilation   Past Medical History:  He,  has a past medical history of Acute on chronic respiratory failure with hypoxia ([place]), Acute renal injury due to hypovolemia [facility]), Autonomic instability, Cardiac arrest ([place]), Cervical spinal cord injury, sequela ([place]), Elevated alkaline phosphatase level, 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   Surgical History:   Past Surgical History:  Procedure Laterality Date  . APPLICATION OF WOUND VAC N/A 08/16/2020   Procedure: APPLICATION OF WOUND VAC;  Surgeon: Fredirick Maudlin, MD;  Location: [facility] ORS;  Service: General;  Laterality: N/A;  . COLONOSCOPY WITH PROPOFOL N/A 12/05/2017   Procedure: COLONOSCOPY WITH PROPOFOL;  Surgeon: Lin Landsman, MD;  Location: [facility] ENDOSCOPY;  Service: Gastroenterology;  Laterality: N/A;  . ESOPHAGOGASTRODUODENOSCOPY  12/05/2017   Procedure: ESOPHAGOGASTRODUODENOSCOPY (EGD);  Surgeon: Lin Landsman, MD;  Location: [place];  Service: Gastroenterology;;  . INCISION AND DRAINAGE ABSCESS N/A 08/16/2020   Procedure: INCISION AND DRAINAGE ABSCESS, SACRAL;  Surgeon: Fredirick Maudlin, MD;  Location: [facility] ORS;  Service: General;  Laterality: N/A;     Social History:   reports that he has never smoked. He has never used smokeless tobacco. He reports previous alcohol use. He reports that he does not use drugs.   Family History:  His family history includes Alcohol abuse in his brother; Cancer in his brother and father; Heart disease (age of onset: 52)  in his brother; Liver cancer in his brother.   Allergies Allergies  Allergen Reactions  . Shrimp (Diagnostic)     Experiences facial droop when eating seafood     Home Medications  Prior to Admission medications   Medication Sig Start Date End Date Taking? Authorizing Provider  acetaminophen (TYLENOL) 160 MG/5ML solution Place 10-20 mLs (320-640 mg total) into feeding tube every 4 (four) hours as needed for mild pain. 07/18/20   Love, Ivan Anchors, PA-C  ascorbic acid (VITAMIN C) 500 MG tablet Place 1 tablet (500 mg total) into feeding tube 2 (two) times daily. 07/18/20   Love, Ivan Anchors, PA-C  baclofen (LIORESAL) 10 MG tablet Take 1 tablet (10 mg total) by mouth 3 (three) times daily. 11/12/20   Johnson, Megan P, DO  bisacodyl (DULCOLAX) 10 MG suppository Place 1 suppository (10 mg total) rectally at bedtime. 07/20/20   Love, Ivan Anchors, PA-C  chlorhexidine (PERIDEX) 0.12 % solution 15 mLs by Mouth Rinse route 2 (two) times daily. 07/20/20   Love, Ivan Anchors, PA-C  collagenase (SANTYL) ointment Apply topically daily. Patient not taking: Reported on 11/07/2020 07/20/20   Bary Leriche, PA-C  diclofenac Sodium (  VOLTAREN) 1 % GEL Apply 4 g topically 4 (four) times daily. 08/31/20   Wicker, Cheryl, NP  famotidine (PEPCID) 20 MG tablet Place 1 tablet (20 mg total) into feeding tube daily. 07/20/20   Love, Pamela S, PA-C  ferrous sulfate 300 (60 Fe) MG/5ML syrup Place 1.3 mLs (78 mg total) into feeding tube daily. 07/20/20   Love, Pamela S, PA-C  fludrocortisone (FLORINEF) 0.1 MG tablet Take 200 mcg by mouth daily. 11/02/20   [provider]  FLUoxetine (PROZAC) 20 MG/5ML solution Place 10 mLs (40 mg total) into feeding tube daily. 11/22/20   Vigg, Avanti, MD  guaiFENesin 200 MG tablet Place 2 tablets (400 mg total) into feeding tube every 6 (six) hours. 07/20/20   Love, Pamela S, PA-C  lidocaine (LIDODERM) 5 % Place 1 patch onto the skin daily. Remove & Discard patch within 12 hours or as directed by  MD 07/20/20   Love, Pamela S, PA-C  midodrine (PROAMATINE) 10 MG tablet Place 1 tablet (10 mg total) into feeding tube 3 (three) times daily with meals. 11/04/20   Johnson, Megan P, DO  Mouthwashes (MOUTH RINSE) LIQD solution 15 mLs by Mouth Rinse route 2 times daily at 12 noon and 4 pm. 07/20/20   Love, Pamela S, PA-C  Multiple Vitamin (MULTIVITAMIN) LIQD Place 5 mLs into feeding tube daily. 07/20/20   Love, Pamela S, PA-C  nutrition supplement, JUVEN, (JUVEN) PACK Place 1 packet into feeding tube 2 (two) times daily between meals. 07/20/20   Love, Pamela S, PA-C  Nutritional Supplements (FEEDING SUPPLEMENT, OSMOLITE 1.5 CAL,) LIQD Place 474 mLs into feeding tube 4 (four) times daily. 07/20/20   Love, Pamela S, PA-C  Nutritional Supplements (FEEDING SUPPLEMENT, PROSOURCE TF,) liquid Place 45 mLs into feeding tube 2 (two) times daily. 07/20/20   Love, Pamela S, PA-C  ondansetron (ZOFRAN) 4 MG tablet Place 1 tablet (4 mg total) into feeding tube every 8 (eight) hours as needed for nausea, vomiting or refractory nausea / vomiting. 07/20/20   Love, Pamela S, PA-C  polycarbophil (FIBERCON) 625 MG tablet Place 1 tablet (625 mg total) into feeding tube daily. 07/20/20   Love, Pamela S, PA-C  rivaroxaban (XARELTO) 10 MG TABS tablet Take one tablet per tube daily 08/08/20   Gonfa, Taye T, MD  saccharomyces boulardii (FLORASTOR) 250 MG capsule Place 1 capsule (250 mg total) into feeding tube 2 (two) times daily. 07/20/20   Love, Pamela S, PA-C  sennosides (SENOKOT) 8.8 MG/5ML syrup Place 5 mLs into feeding tube daily at 6 (six) AM. 07/20/20   Love, Pamela S, PA-C  simethicone (MYLICON) 40 MG/0.6ML drops Place 1.2 mLs (80 mg total) into feeding tube 4 (four) times daily. 07/20/20   Love, Pamela S, PA-C  traZODone (DESYREL) 50 MG tablet Place 1 tablet (50 mg total) into feeding tube at bedtime. 10/23/20   Holdsworth, Karen, NP  Water For Irrigation, Sterile (FREE WATER) SOLN Place 400 mLs into feeding tube every 4  (four) hours. Patient taking differently: Place 300 mLs into feeding tube every 4 (four) hours. 07/20/20   Love, Pamela S, PA-C  zinc sulfate 220 (50 Zn) MG capsule Place 1 capsule (220 mg total) into feeding tube daily. 07/20/20   Love, Pamela S, PA-C         Critical care provider statement:    Critical care time (minutes):  45   Critical care time was exclusive of:  Separately billable procedures and  treating other patients   Critical care was   necessary to treat or prevent imminent or  life-threatening deterioration of the following conditions:  mucu plugging, acute respiratory distress with hypoxemia, possible sepsis   Critical care was time spent personally by me on the following  activities:  Development of treatment plan with patient or surrogate,  discussions with consultants, evaluation of patient's response to  treatment, examination of patient, obtaining history from patient or  surrogate, ordering and performing treatments and interventions, ordering  and review of laboratory studies and re-evaluation of patient's condition   I assumed direction of critical care for this patient from another  provider in my specialty: no         

## 2020-12-12 NOTE — Progress Notes (Addendum)
Pharmacy Antibiotic Note  Ryan Day is a 70 y.o. male admitted on 12/07/2020 with sepsis. Pt presented with complaints of recurrent UTI and hematuria with foley cath last placed ~2 weeks ago, exchanged in ED. 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 Pt has PEG and trache. Pt admitted 11/22-12/5 for HCAP and sacral ulcer with osteomyelitis. Hx of enterobacter cloacae, pseudomonas in ucx. Pt has received vanc, Unasyn, Zosyn, and cefepime on recent previous admissions. Surgery & ID consulted. Pharmacy has been consulted for Vancomycin dosing. Tmax 100, Tmin 94.3 in the last 24 hours. Patient is now normotensive on levophed gtt, midodrine, and fludrocortisone.    Plan: --Discontinue meropenem based on sensitivity to ceftazidime from BAL. Cefepime 2 grams q8h restarted based on BAL result.  --Vanc random 3/22 @ 0601: 12 . Will give vancomycin 1750 mg x 1 dose based on improving SCr and UOP. Will continue to trend SCr and likely switch to AUC dosing on 3/23 due to improving SCr trend. --Continue to monitor creatinine while on vancomycin.  Height: 6' 5.01" (195.6 cm) Weight: 124.9 kg (275 lb 5.7 oz) IBW/kg (Calculated) : 89.12  Temp (24hrs), Avg:98.7 F (37.1 C), Min:94.3 F (34.6 C), Max:100 F (37.8 C)  Recent Labs  Lab 12/08/20 0450 12/08/20 0921 12/08/20 1151 12/08/20 1927 12/08/20 2337 12/09/20 0454 12/10/20 0513 12/10/20 0652 12/11/20 0547 12/12/20 0542 12/12/20 0601  WBC 17.6*  --   --   --   --  15.2* 15.7*  --  11.2* 9.8  --   CREATININE 1.87*  --   --   --   --  1.66* 1.20  --  1.09 0.89  --   LATICACIDVEN  --  7.3* 7.4* 5.1* 3.3* 2.3*  --   --   --   --   --   VANCORANDOM  --   --   --   --   --  20  --    < > 14  --  12   < > = values in this interval not displayed.    Estimated Creatinine Clearance: 116.2 mL/min (by C-G formula based on SCr of 0.89 mg/dL).    Allergies  Allergen Reactions  . Shrimp (Diagnostic)     Experiences  facial droop when eating seafood    Antimicrobials this admission: 3/17 vancomycin  >>  3/17 cefepime  >> 3/20; 3/22 >> 3/17 Zosyn x 1  3/20 meropenem >> 3/21   Dose adjustments this admission: - Dosing based on variable renal function   Microbiology results: 3/17 BCx: e. Cloacae, MRSA, e. Faecalis 3/17 UCx: e. cloacae 3/18 resp cx: pseudomonas aeruginosa 3/18 BAL: Pseudomonas aeruginosa (ceftazidime sensitive), staph aureus, e. faecalis 3/17 MRSA PCR: positive  Thank you for allowing pharmacy to be a part of this patient's care.  Doreatha Massed, Student-PharmD  12/12/2020 8:23 AM

## 2020-12-12 NOTE — Progress Notes (Signed)
Peripherally Inserted Central Catheter Placement  The IV Nurse has discussed with the patient and/or persons authorized to consent for the patient, the purpose of this procedure and the potential benefits and risks involved with this procedure.  The benefits include less needle sticks, lab draws from the catheter, and the patient may be discharged home with the catheter. Risks include, but not limited to, infection, bleeding, blood clot (thrombus formation), and puncture of an artery; nerve damage and irregular heartbeat and possibility to perform a PICC exchange if needed/ordered by physician.  Alternatives to this procedure were also discussed.  Bard Power PICC patient education guide, fact sheet on infection prevention and patient information card has been provided to patient /or left at bedside.    PICC Placement Documentation  PICC Double Lumen 12/12/20 PICC Right Brachial 41 cm 0 cm (Active)  Exposed Catheter (cm) 0 cm 12/12/20 1632  Site Assessment Clean;Dry;Intact 12/12/20 1632  Lumen #1 Status Flushed;Saline locked;Blood return noted 12/12/20 1632  Lumen #2 Status Flushed;Saline locked;Blood return noted 12/12/20 1632  Dressing Type Transparent;Securing device 12/12/20 1632  Antimicrobial disc in place? Yes 12/12/20 1632  Safety Lock Not Applicable 39/67/28 9791  Dressing Change Due 12/19/20 12/12/20 1632  Difficult placement due to edema to right arm     Frances Maywood 12/12/2020, 4:35 PM

## 2020-12-12 NOTE — Progress Notes (Incomplete)
ID  Patient Vitals for the past 24 hrs:  BP Temp Temp src Pulse Resp SpO2  12/12/20 1800 (!) 121/57 98.78 F (37.1 C) - 81 - 99 %  12/12/20 1710 - - - - - 98 %  12/12/20 1700 102/67 98.6 F (37 C) - (!) 39 - 99 %  12/12/20 1600 134/60 98.6 F (37 C) Bladder (!) 40 - 98 %  12/12/20 1500 114/62 98.42 F (36.9 C) - (!) 42 (!) 25 98 %  12/12/20 1400 116/60 97.88 F (36.6 C) - 80 (!) 30 99 %  12/12/20 1300 123/61 - - 79 (!) 46 98 %  12/12/20 1200 (!) 110/56 - - 80 (!) 30 99 %  12/12/20 1101 - - - - - 98 %  12/12/20 1100 (!) 115/56 97.7 F (36.5 C) - (!) 42 (!) 32 97 %  12/12/20 1000 (!) 110/55 (!) 97.52 F (36.4 C) - (!) 40 (!) 31 98 %  12/12/20 0900 (!) 101/52 (!) 97.52 F (36.4 C) - 87 (!) 28 96 %  12/12/20 0800 125/75 (!) 97.52 F (36.4 C) Bladder (!) 38 (!) 22 97 %  12/12/20 0747 - - - - - 98 %  12/12/20 0700 121/68 (!) 97.52 F (36.4 C) - 75 - 98 %  12/12/20 0600 136/84 97.9 F (36.6 C) - (!) 41 (!) 30 98 %  12/12/20 0500 (!) 104/52 98.1 F (36.7 C) - (!) 37 19 94 %  12/12/20 0400 119/78 98.1 F (36.7 C) - (!) 39 (!) 23 97 %  12/12/20 0300 (!) 102/54 98.1 F (36.7 C) - (!) 38 (!) 38 96 %  12/12/20 0200 (!) 105/54 98.2 F (36.8 C) - (!) 40 (!) 39 97 %  12/12/20 0100 (!) 96/53 98.2 F (36.8 C) - (!) 38 (!) 31 97 %  12/12/20 0000 (!) 85/69 98.4 F (36.9 C) - (!) 41 (!) 28 96 %  12/11/20 2300 (!) 98/56 99 F (37.2 C) - 81 (!) 32 96 %  12/11/20 2200 (!) 89/52 99.5 F (37.5 C) - (!) 43 (!) 30 95 %  12/11/20 2100 106/84 100 F (37.8 C) - (!) 37 (!) 25 93 %  12/11/20 2000 95/60 100 F (37.8 C) - 89 (!) 21 93 %    Labs CBC Latest Ref Rng & Units 12/12/2020 12/11/2020 12/10/2020  WBC 4.0 - 10.5 K/uL 9.8 11.2(H) 15.7(H)  Hemoglobin 13.0 - 17.0 g/dL 7.3(L) 7.5(L) 7.6(L)  Hematocrit 39.0 - 52.0 % 23.9(L) 23.6(L) 25.0(L)  Platelets 150 - 400 K/uL 98(L) 110(L) 126(L)    CMP Latest Ref Rng & Units 12/12/2020 12/11/2020 12/10/2020  Glucose 70 - 99 mg/dL 167(H) 170(H) -  BUN 8  - 23 mg/dL 40(H) 44(H) -  Creatinine 0.61 - 1.24 mg/dL 0.89 1.09 -  Sodium 135 - 145 mmol/L 155(H) 154(H) 155(H)  Potassium 3.5 - 5.1 mmol/L 3.6 3.4(L) 3.1(L)  Chloride 98 - 111 mmol/L 122(H) 126(H) -  CO2 22 - 32 mmol/L 28 25 -  Calcium 8.9 - 10.3 mg/dL 9.1 9.1 -  Total Protein 6.5 - 8.1 g/dL - - -  Total Bilirubin 0.3 - 1.2 mg/dL - - -  Alkaline Phos 38 - 126 U/L - - -  AST 15 - 41 U/L - - -  ALT 0 - 44 U/L - - -    Micro  Impression/recommendation   Colonized with multiple organisms some which are MDRO in trachea, lungs, urine, wound

## 2020-12-12 NOTE — TOC Progression Note (Addendum)
Transition of Care [facility]) - Progression Note    Patient Details  Name: Ryan Day MRN: [id number] Date of Birth: [birth year]  Transition of Care [facility]) CM/SW [place], [place] Phone Number: [phone] 12/12/2020, 11:31 AM  Clinical Narrative:     Family plan is for patient to return home when he is ready for discharge.  Main contact is his spouse,  Ryan Day [phone].  Ms. Essman stated the patient did not qualify for Medicaid because of his assets and they were not comfortable selling off their assets to qualify for Medicaid.  Ms. Bamba spoke about the need for DME and the issues they were having with [employer]. CSW spoke with Ms Pellow about the possible out-of-pocket costs for DME, and the importance of having Medicare A & B. Ms. Tabar stated she has assistance from her daughters and their spouses but she needs time for herself.  CSW spoke to Ms. Soy in details about the importance of self-care, and the importance of making difficult decisions now, to impact the future. Ms. Panther verbalized understanding.    Expected Discharge Plan: [place] Barriers to Discharge: Continued Medical Work up  Expected Discharge Plan and Services Expected Discharge Plan: [place] In-house Referral: Clinical Social Work   Post Acute Care Choice: Durable Medical Equipment Living arrangements for the past 2 months: Single Family Home                                       Social Determinants of Health (SDOH) Interventions    Readmission Risk Interventions No flowsheet data found.

## 2020-12-12 NOTE — Progress Notes (Signed)
Pharmacy - Antibiotic dosing (Ciprofloxacin)  See note from earlier today for vancomycin and cefepime dosing.  Patient with polymicrobial bacteremia (MRSA, E faecalis, E cloacae)  Pseudomonas in respiratory cultures (susc vary slightly from BAL and TA cultures)  Plan  Due to increase secretions, ID adding ciprofloxacin.  Dose ciprofloxacin 454m IV q8h for pseudomonas  DDoreene Eland PharmD, BCPS.   Work Cell: [phone]/22/2022 4:19 PM

## 2020-12-12 NOTE — Consult Note (Signed)
[facility] for Electrolyte Monitoring and Replacement   Recent Labs: Potassium (mmol/L)  Date Value  12/12/2020 3.6   Magnesium (mg/dL)  Date Value  12/12/2020 2.3   Calcium (mg/dL)  Date Value  12/12/2020 9.1   Albumin (g/dL)  Date Value  12/07/2020 3.1 (L)  02/01/2020 4.4   Phosphorus (mg/dL)  Date Value  12/12/2020 2.5   Sodium (mmol/L)  Date Value  12/12/2020 155 (H)  02/01/2020 143   Assessment: Patient is a 70 y/o M presented with sepsis. Pt presented with complaints of recurrent UTI and hematuria. 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 Pt has chronic tracheostomy and PEG tube. PMH includes HTN and HLD. Pharmacy has been consulted to assist with electrolyte monitoring and replacement as indicated. Scr trending down 1.87 > 1.66>1.09>0.89.   Nutrition: tube feeds @ 70 mL/hr   MIVF: D5 @ 50 mL/hr  Diuretics: Furosemide 40 mg IV x 1 dose  Goal of Therapy:  Electrolytes within normal limits  Plan:  --Na 155 - continue free water 200 mL per tube q2h and D5 MIVF. Prosource tube feeds decreased from twice daily to daily.  --K 3.6 - NP ordered KCl 40 mEq PO x 1 dose  --Follow-up electrolytes with AM labs  Benn Moulder, PharmD Pharmacy Resident  12/12/2020 6:59 AM

## 2020-12-13 DIAGNOSIS — A4189 Other specified sepsis: Secondary | ICD-10-CM | POA: Diagnosis not present

## 2020-12-13 DIAGNOSIS — L89156 Pressure-induced deep tissue damage of sacral region: Secondary | ICD-10-CM | POA: Diagnosis not present

## 2020-12-13 DIAGNOSIS — B965 Pseudomonas (aeruginosa) (mallei) (pseudomallei) as the cause of diseases classified elsewhere: Secondary | ICD-10-CM | POA: Diagnosis not present

## 2020-12-13 DIAGNOSIS — A4181 Sepsis due to Enterococcus: Secondary | ICD-10-CM | POA: Diagnosis not present

## 2020-12-13 LAB — CBC WITH DIFFERENTIAL/PLATELET
Abs Immature Granulocytes: 0.23 10*3/uL — ABNORMAL HIGH (ref 0.00–0.07)
Basophils Absolute: 0 10*3/uL (ref 0.0–0.1)
Basophils Relative: 0 %
Eosinophils Absolute: 0.4 10*3/uL (ref 0.0–0.5)
Eosinophils Relative: 3 %
HCT: 24.1 % — ABNORMAL LOW (ref 39.0–52.0)
Hemoglobin: 7.6 g/dL — ABNORMAL LOW (ref 13.0–17.0)
Immature Granulocytes: 2 %
Lymphocytes Relative: 10 %
Lymphs Abs: 1.3 10*3/uL (ref 0.7–4.0)
MCH: 29.3 pg (ref 26.0–34.0)
MCHC: 31.5 g/dL (ref 30.0–36.0)
MCV: 93.1 fL (ref 80.0–100.0)
Monocytes Absolute: 1.3 10*3/uL — ABNORMAL HIGH (ref 0.1–1.0)
Monocytes Relative: 10 %
Neutro Abs: 9.5 10*3/uL — ABNORMAL HIGH (ref 1.7–7.7)
Neutrophils Relative %: 75 %
Platelets: 139 10*3/uL — ABNORMAL LOW (ref 150–400)
RBC: 2.59 MIL/uL — ABNORMAL LOW (ref 4.22–5.81)
RDW: 16.7 % — ABNORMAL HIGH (ref 11.5–15.5)
Smear Review: NORMAL
WBC: 12.8 10*3/uL — ABNORMAL HIGH (ref 4.0–10.5)
nRBC: 1 % — ABNORMAL HIGH (ref 0.0–0.2)

## 2020-12-13 LAB — GLUCOSE, CAPILLARY
Glucose-Capillary: 141 mg/dL — ABNORMAL HIGH (ref 70–99)
Glucose-Capillary: 153 mg/dL — ABNORMAL HIGH (ref 70–99)
Glucose-Capillary: 151 mg/dL — ABNORMAL HIGH (ref 70–99)
Glucose-Capillary: 162 mg/dL — ABNORMAL HIGH (ref 70–99)

## 2020-12-13 LAB — BASIC METABOLIC PANEL
Anion gap: 5 (ref 5–15)
BUN: 40 mg/dL — ABNORMAL HIGH (ref 8–23)
CO2: 27 mmol/L (ref 22–32)
Calcium: 9.1 mg/dL (ref 8.9–10.3)
Chloride: 119 mmol/L — ABNORMAL HIGH (ref 98–111)
Creatinine, Ser: 0.7 mg/dL (ref 0.61–1.24)
GFR, Estimated: >60 mL/min (ref >60)
Glucose, Bld: 170 mg/dL — ABNORMAL HIGH (ref 70–99)
Potassium: 3.3 mmol/L — ABNORMAL LOW (ref 3.5–5.1)
Sodium: 151 mmol/L — ABNORMAL HIGH (ref 135–145)

## 2020-12-13 LAB — PHOSPHORUS: Phosphorus: 2.6 mg/dL (ref 2.5–4.6)

## 2020-12-13 LAB — PROCALCITONIN: Procalcitonin: 32.49 ng/mL

## 2020-12-13 LAB — FUNGITELL, SERUM: Fungitell Result: <31 pg/mL (ref <80)

## 2020-12-13 LAB — MAGNESIUM: Magnesium: 2.2 mg/dL (ref 1.7–2.4)

## 2020-12-13 MED ORDER — VANCOMYCIN HCL 1000 MG/200ML IV SOLN
1000.0000 mg | Freq: Two times a day (BID) | INTRAVENOUS | Status: DC
  Administered 2020-12-13 – 2020-12-17 (×8): 1000 mg via INTRAVENOUS
  Filled 2020-12-13 (×10): qty 200

## 2020-12-13 MED ORDER — POTASSIUM CHLORIDE 20 MEQ PO PACK
20.0000 meq | PACK | ORAL | Status: AC
  Administered 2020-12-13 (×2): 20 meq
  Filled 2020-12-13: qty 1

## 2020-12-13 MED ORDER — POTASSIUM CHLORIDE 10 MEQ/50ML IV SOLN
10.0000 meq | INTRAVENOUS | Status: AC
  Administered 2020-12-13 (×4): 10 meq via INTRAVENOUS
  Filled 2020-12-13 (×4): qty 50

## 2020-12-13 NOTE — Progress Notes (Addendum)
Pharmacy Antibiotic Note  Ryan Day is a 70 y.o. male admitted on 12/07/2020 with sepsis. Pt presented with complaints of recurrent UTI and hematuria with foley cath last placed ~2 weeks ago, exchanged in ED. 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 Pt has PEG and trache. Pt admitted 11/22-12/5 for HCAP and sacral ulcer with osteomyelitis. Hx of enterobacter cloacae, pseudomonas in ucx. Pt has received vanc, Unasyn, Zosyn, and cefepime on recent previous admissions. Surgery & ID consulted. Pharmacy has been consulted for Vancomycin dosing.   Patient is now normotensive on midodrine and fludrocortisone. SCr trending toward baseline(~0.5): 1.87 > 1.66 > 1.09 > 0.89 > 0.7.  Vanc random 3/22 @ 0601: 12 . Was given vancomycin 1750 mg x 1 dose based on improving SCr and UOP. Meropenem was discontinued 3/22 based on sensitivity to ceftazidime from BAL. Per ID consult, IV Cipro was added due to increased secretions.     Plan: -- Continue Cipro IV 400 mg q8h  -- Continue cefepime 2 grams q8h  -- Vancomycin adjusted to 1051m q12h based on improving SCr and UOP.  --Continue to monitor creatinine daily  Height: 6' 5.01" (195.6 cm) Weight: 124.9 kg (275 lb 5.7 oz) IBW/kg (Calculated) : 89.12  Temp (24hrs), Avg:98.3 F (36.8 C), Min:97.52 F (36.4 C), Max:99.32 F (37.4 C)  Recent Labs  Lab 12/08/20 0921 12/08/20 1151 12/08/20 1927 12/08/20 2337 12/09/20 0454 12/10/20 0513 12/10/20 [id number]/21/22 0547 12/12/20 0542 12/12/20 0601 12/13/20 0550  WBC  --   --   --   --  15.2* 15.7*  --  11.2* 9.8  --  12.8*  CREATININE  --   --   --   --  1.66* 1.20  --  1.09 0.89  --  0.70  LATICACIDVEN 7.3* 7.4* 5.1* 3.3* 2.3*  --   --   --   --   --   --   VANCORANDOM  --   --   --   --  20  --    < > 14  --  12  --    < > = values in this interval not displayed.    Estimated Creatinine Clearance: 129.3 mL/min (by C-G formula based on SCr of 0.7 mg/dL).    Allergies   Allergen Reactions  . Shrimp (Diagnostic)     Experiences facial droop when eating seafood    Antimicrobials this admission: 3/17 Vancomycin  >>  3/17 Cefepime  >> 3/20; 3/22 >> 3/17 Zosyn x 1  3/20 Meropenem >> 3/21  3/22 Cipro >>  Dose adjustments this admission: - Dosing based on variable renal function   Microbiology results: 3/17 BCx: e. Cloacae, MRSA, e. Faecalis 3/17 UCx: e. cloacae 3/18 resp cx: pseudomonas aeruginosa 3/18 BAL: Pseudomonas aeruginosa (ceftazidime sensitive), staph aureus, e. faecalis 3/17 MRSA PCR: positive  Thank you for allowing pharmacy to be a part of this patient's care.  JDoreatha Massed Student-PharmD  12/13/2020 8:09 AM

## 2020-12-13 NOTE — Progress Notes (Signed)
NAME:  Ryan Day, MRN:  [id number], DOB:  1951/06/21, LOS: 6 ADMISSION DATE:  12/07/2020, CONSULTATION DATE: 12/07/2020 REFERRING MD: Dr. Tamala Julian, CHIEF COMPLAINT: Hematuria   History of Present Illness:  This is a 70 yo quadriplegic male with a chronic tracheostomy who presented to [facility] ER on 03/17 with hematuria.  According to pts family last week pt noted to have sediment in his urine at home concerning for UTI.  He had a foley catheter placed 2 weeks ago and completed a 7 day course of abx therapy due to UTI.  Due to concerns of recurrent UTI pts family contacted pts PCP office last week, and heard from his PCP this week and were awaiting urine culture results.  However, pt noted to have hematuria yesterday and today along with sinus tachycardia hr 120's.  Pts daughter is a Marine scientist and due to concern of sepsis pt transported to the ER via EMS for further evaluation and treatment.    ED course Upon arrival to the ER pts O2 sats were 75-80% on RA, therefore pt placed on NRB with O2 sats initially increasing to 100%.  Pt transitioned to trach collar with initial O2 sats in the low 90's.  However, pt noted to have mucous plugging resulting in hypoxia requiring exchange of tracheostomy from a cuffless trach to a size 6 mm cuffed trach and pt placed on mechanical ventilation.  ER vital signs were: temp 101.7 rectally, sbp 60-80's, and hr 130's. Lab results revealed Na+ 153, chloride 119, glucose 147, lactic acid 2.8, wbc 2.0, hbg 11.1, and UA positive for UTI.  CXR negative and COVID-19/Influenza PCR results pending.  Sepsis protocol initiated and pt received cefepime, vancomycin, and 2L LR bolus.  Pt remained severely hypotensive requiring levophed gtt and right femoral central line placement.  PCCM team contacted for ICU admission.    12/08/20- patient is improved some he is communicative.  Wife [name] at bedside we reviewed care plan.   12/10/20- patient is improved he is down to 35mg on levophed and 30%Fio2  on ventilator, mentating well. Reviewed careplan with wife LVaughan Bastatoday.  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 C5 Cervical Fracture (following a mechanical fall at home) s/p C3-6 Laminectomy and Posterior Instrumentation and Fusion 08/4  Quadriplegia  Spinal Cord Compression  Chronic Tracheostomy and PEG    Significant Hospital Events: Including procedures, antibiotic start and stop dates in addition to other pertinent events   03/17: Pt admitted to ICU with urosepsis requiring vasopressors and acute on chronic hypoxic respiratory failure secondary to mucous plugging requiring exchange of cuffless trach to size 6 mm cuffed trach to be placed on mechanical ventilation  03/17: Right femoral central line placed by ER physician  03/17: CT Abd Pelvis findings with cystitis. Correlation with urinalysis is Recommended. Mild to moderate severity bibasilar atelectasis and/or Infiltrate. Sigmoid diverticulosis. Sacral decubitus ulcer, as described above. MRI correlation is recommended, as sequelae associated with acute osteomyelitis cannot be excluded. Aortic atherosclerosis. 03/17: CT Head revealed mild chronic ischemic white matter disease. No acute intracranial abnormality seen 03/17: Vancomycin x1 dose and Cefepime x1 dose 03/17: Zosyn>>  Interim History / Subjective:  Pt lethargic no signs of respiratory distress mechanically intubated via tracheostomy   Objective   Blood pressure (!) 105/57, pulse (!) 39, temperature 98.06 F (36.7 C), resp. rate (!) 26, height 6' 5.01" (1.956 m), weight 124.9 kg, SpO2  98 %.    Vent Mode: PRVC FiO2 (%):  [30 %] 30 % Set Rate:  [24 bmp] 24 bmp Vt Set:  [500 mL] 500 mL PEEP:  [8 cmH20] 8 cmH20 Plateau Pressure:  [21 cmH20-22 cmH20] 21 cmH20   Intake/Output Summary (Last 24 hours) at 12/13/2020 3005 Last data filed at  12/13/2020 0831 Gross per 24 hour  Intake 1056.83 ml  Output 3225 ml  Net -2168.17 ml   Filed Weights   12/10/20 0500 12/11/20 0500 12/13/20 0500  Weight: 125.1 kg 124.9 kg 124.9 kg   Examination: General: chronically appearing male, NAD receiving mechanical ventilation via trach  HENT: supple, no JVD  Lungs: diminished throughout, even, non labored  Cardiovascular: sinus tach, no R/G, 2+ radial/1+ distal pulses, 2+ bilateral lower extremity edema  Abdomen: hypoactive BS x, soft, non distended  Extremities: quadriplegic Neuro: communicates to verbal language Skin: sacral spine decubitus  GU: chronic foley in place   Labs/imaging personally reviewed   CT Abd/Pelvis CXR CT Head  UA  Resolved Hospital Problem list   N/A  Assessment & Plan:   RESPIRATORY:  3/22                                                                         3/19   Acute on chronic hypoxic respiratory failure secondary to mucous plugging which is chronic   Mechanical ventilation via chronic tracheostomy        S/p bronch - many GPCs , cleared bilateral airways   Full vent support for now-vent settings reviewed and established  SBT once all parameters met  VAP bundle implemented   Aggressive pulmonary hygiene   Mucus plugging last night with distress, alleviated by PPB-ambu and lavage  CXR worsening infiltrates noted above  CARDIOVASCULAR:  ECHO  1.EF 55 to 60%. LVH G2DD  2. RV size and function normal   3. The mitral valve is normal in structure. No MR   4. The aortic valve is grossly normal. No AR   Septic shock  Continuous telemetry monitoring   Aggressive fluid resuscitation and levophed/vasopressin gtts to maintain map >65  RENAL: Lab Results  Component Value Date   CREATININE 0.70 12/13/2020   BUN 40 (H) 12/13/2020   NA 151 (H) 12/13/2020   K 3.3 (L) 12/13/2020   CL 119 (H) 12/13/2020   CO2 27 12/13/2020    Intake/Output Summary (Last 24 hours) at 12/13/2020  0837 Last data filed at 12/13/2020 0831 Gross per 24 hour  Intake 1056.83 ml  Output 3225 ml  Net -2168.17 ml   Net IO Since Admission: -8,812.19 mL [12/13/20 0837]   Mild acute renal failure secondary to ATN  Anion Gap Metabolic Acidosis, Severe lactic acidosis   Trend BMP and lactic acid, gap resolved, lactic 2.3  Replace electrolytes as indicated   Monitor UOP, despite all the free water and D5W Na remains >150  Trial vasopressin and monitor Uop  Avoid nephrotoxic medications   ID: Lab Results  Component Value Date   WBC 12.8 (H) 12/13/2020    Urosepsis secondary to chronic foley   Leukopenia  Septic Shock   Trend WBC and monitor fever curve   PCT skyrocketed >150 from 3/18, re-check today  Resistance pattern of the Pseudomonas vs Enterobacter, switch to Cefepipme  Appreciate ID consultant   Foley catheter exchanged in the ED  Sacral spine pressure ulcer-pt previously had wound vac in place with removal   Wound Care consulted appreciate input   HEME: Lab Results  Component Value Date   HCT 24.1 (L) 12/13/2020    Anemia without obvious acute blood loss  Trend CBC   Monitor for s/sx of bleeding  Transfuse for hgb <7  NEURO:  Quadriplegia-bed bound  Acute toxic encephalopathy   Frequent reorientation   Will attempt to avoid sedating medications   Best practice (evaluated daily)  Diet:  NPO Pain/Anxiety/Delirium protocol (if indicated): Yes (RASS goal 0) VAP protocol (if indicated): Yes DVT prophylaxis: Systemic AC GI prophylaxis: H2B Glucose control:  SSI No Central venous access:  Yes, and it is still needed Arterial line:  N/A Foley:  Yes, and it is still needed Mobility:  bed rest  PT consulted: N/A Last date of multidisciplinary goals of care discussion [N/A] Code Status:  full code Disposition: ICU   Labs   CBC: Recent Labs  Lab 12/07/20 1725 12/08/20 0450 12/09/20 0454 12/10/20 0513 12/11/20 0547 12/12/20 0542  12/13/20 0550  WBC 2.0*   < > 15.2* 15.7* 11.2* 9.8 12.8*  NEUTROABS 1.3*  --   --   --   --   --  9.5*  HGB 11.1*   < > 8.2* 7.6* 7.5* 7.3* 7.6*  HCT 36.1*   < > 26.3* 25.0* 23.6* 23.9* 24.1*  MCV 95.3   < > 96.0 95.4 93.7 95.2 93.1  PLT 213   < > 133* 126* 110* 98* 139*   < > = values in this interval not displayed.    Basic Metabolic Panel: Recent Labs  Lab 12/09/20 0454 12/09/20 1538 12/10/20 0513 12/10/20 1548 12/11/20 0547 12/11/20 1942 12/12/20 0542 12/13/20 0550  NA 147*   < > 155* 155* 154*  --  155* 151*  K 3.1*   < > 3.2* 3.1* 3.4*  --  3.6 3.3*  CL 116*  --  124*  --  126*  --  122* 119*  CO2 24  --  23  --  25  --  28 27  GLUCOSE 114*  --  132*  --  170*  --  167* 170*  BUN 54*  --  45*  --  44*  --  40* 40*  CREATININE 1.66*  --  1.20  --  1.09  --  0.89 0.70  CALCIUM 9.0  --  9.3  --  9.1  --  9.1 9.1  MG 2.2  --  2.5*  --  2.5*  --  2.3 2.2  PHOS 4.1  --  2.0*  --  1.3* 3.1 2.5 2.6   < > = values in this interval not displayed.   GFR: Estimated Creatinine Clearance: 129.3 mL/min (by C-G formula based on SCr of 0.7 mg/dL). Recent Labs  Lab 12/07/20 1725 12/07/20 1933 12/08/20 0450 12/08/20 0921 12/08/20 1151 12/08/20 1927 12/08/20 2337 12/09/20 0454 12/10/20 0513 12/11/20 0547 12/12/20 0542 12/13/20 0550  PROCALCITON 0.23  --  >150.00  --   --   --   --  >150.00  --   --   --   --   WBC 2.0*  --  17.6*  --   --   --   --  15.2* 15.7* 11.2* 9.8 12.8*  LATICACIDVEN 2.8*   < >  --    < >  7.4* 5.1* 3.3* 2.3*  --   --   --   --    < > = values in this interval not displayed.    Liver Function Tests: Recent Labs  Lab 12/07/20 1725  AST 32  ALT 26  ALKPHOS 179*  BILITOT 1.2  PROT 8.4*  ALBUMIN 3.1*   No results for input(s): LIPASE, AMYLASE in the last 168 hours. No results for input(s): AMMONIA in the last 168 hours.  ABG    Component Value Date/Time   PHART 7.38 12/08/2020 0500   PCO2ART 42 12/08/2020 0500   PO2ART 115 (H)  12/08/2020 0500   HCO3 24.8 12/08/2020 0500   ACIDBASEDEF 0.4 12/08/2020 0500   O2SAT 98.4 12/08/2020 0500     Coagulation Profile: No results for input(s): INR, PROTIME in the last 168 hours.  Cardiac Enzymes: No results for input(s): CKTOTAL, CKMB, CKMBINDEX, TROPONINI in the last 168 hours.  HbA1C: Hgb A1c MFr Bld  Date/Time Value Ref Range Status  06/09/2020 07:00 AM 5.8 (H) 4.8 - 5.6 % Final    Comment:    (NOTE) Pre diabetes:          5.7%-6.4%  Diabetes:              >6.4%  Glycemic control for   <7.0% adults with diabetes     CBG: Recent Labs  Lab 12/12/20 1632 12/12/20 1920 12/12/20 2336 12/13/20 0338 12/13/20 0711  GLUCAP 148* 153* 147* 141* 153*    Review of Systems:   Unable to assess pt chronically trached currently requiring mechanical ventilation   Past Medical History:  He,  has a past medical history of Acute on chronic respiratory failure with hypoxia ([place]), Acute renal injury due to hypovolemia 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   Surgical History:   Past Surgical History:  Procedure Laterality Date  . APPLICATION OF WOUND VAC N/A 08/16/2020   Procedure: APPLICATION OF WOUND VAC;  Surgeon: Fredirick Maudlin, MD;  Location: [facility] ORS;  Service: General;  Laterality: N/A;  . COLONOSCOPY WITH PROPOFOL N/A 12/05/2017   Procedure: COLONOSCOPY WITH PROPOFOL;  Surgeon: Lin Landsman, MD;  Location: [facility] ENDOSCOPY;  Service: Gastroenterology;  Laterality: N/A;  . ESOPHAGOGASTRODUODENOSCOPY  12/05/2017   Procedure: ESOPHAGOGASTRODUODENOSCOPY (EGD);  Surgeon: Lin Landsman, MD;  Location: [place];  Service: Gastroenterology;;  . INCISION AND DRAINAGE ABSCESS N/A 08/16/2020   Procedure: INCISION AND DRAINAGE ABSCESS, SACRAL;  Surgeon: Fredirick Maudlin, MD;   Location: [facility] ORS;  Service: General;  Laterality: N/A;     Social History:   reports that he has never smoked. He has never used smokeless tobacco. He reports previous alcohol use. He reports that he does not use drugs.   Family History:  His family history includes Alcohol abuse in his brother; Cancer in his brother and father; Heart disease (age of onset: 59) in his brother; Liver cancer in his brother.   Allergies Allergies  Allergen Reactions  . Shrimp (Diagnostic)     Experiences facial droop when eating seafood     Home Medications  Prior to Admission medications   Medication Sig Start Date End Date Taking? Authorizing Provider  acetaminophen (TYLENOL) 160 MG/5ML solution Place 10-20 mLs (320-640 mg total) into feeding tube every 4 (four) hours as needed for mild pain. 07/18/20   Love, Ivan Anchors, PA-C  ascorbic acid (VITAMIN C) 500  MG tablet Place 1 tablet (500 mg total) into feeding tube 2 (two) times daily. 07/18/20   Love, Ivan Anchors, PA-C  baclofen (LIORESAL) 10 MG tablet Take 1 tablet (10 mg total) by mouth 3 (three) times daily. 11/12/20   Johnson, Megan P, DO  bisacodyl (DULCOLAX) 10 MG suppository Place 1 suppository (10 mg total) rectally at bedtime. 07/20/20   Love, Ivan Anchors, PA-C  chlorhexidine (PERIDEX) 0.12 % solution 15 mLs by Mouth Rinse route 2 (two) times daily. 07/20/20   Love, Ivan Anchors, PA-C  collagenase (SANTYL) ointment Apply topically daily. Patient not taking: Reported on 11/07/2020 07/20/20   Love, Ivan Anchors, PA-C  diclofenac Sodium (VOLTAREN) 1 % GEL Apply 4 g topically 4 (four) times daily. 08/31/20   Kathrine Haddock, NP  famotidine (PEPCID) 20 MG tablet Place 1 tablet (20 mg total) into feeding tube daily. 07/20/20   Love, Ivan Anchors, PA-C  ferrous sulfate 300 (60 Fe) MG/5ML syrup Place 1.3 mLs (78 mg total) into feeding tube daily. 07/20/20   Love, Ivan Anchors, PA-C  fludrocortisone (FLORINEF) 0.1 MG tablet Take 200 mcg by mouth daily. 11/02/20   [provider]  FLUoxetine (PROZAC) 20 MG/5ML solution Place 10 mLs (40 mg total) into feeding tube daily. 11/22/20   Vigg, Avanti, MD  guaiFENesin 200 MG tablet Place 2 tablets (400 mg total) into feeding tube every 6 (six) hours. 07/20/20   Love, Ivan Anchors, PA-C  lidocaine (LIDODERM) 5 % Place 1 patch onto the skin daily. Remove & Discard patch within 12 hours or as directed by MD 07/20/20   Love, Ivan Anchors, PA-C  midodrine (PROAMATINE) 10 MG tablet Place 1 tablet (10 mg total) into feeding tube 3 (three) times daily with meals. 11/04/20   Park Liter P, DO  Mouthwashes (MOUTH RINSE) LIQD solution 15 mLs by Mouth Rinse route 2 times daily at 12 noon and 4 pm. 07/20/20   Love, Ivan Anchors, PA-C  Multiple Vitamin (MULTIVITAMIN) LIQD Place 5 mLs into feeding tube daily. 07/20/20   Love, Ivan Anchors, PA-C  nutrition supplement, JUVEN, (JUVEN) PACK Place 1 packet into feeding tube 2 (two) times daily between meals. 07/20/20   Love, Ivan Anchors, PA-C  Nutritional Supplements (FEEDING SUPPLEMENT, OSMOLITE 1.5 CAL,) LIQD Place 474 mLs into feeding tube 4 (four) times daily. 07/20/20   Love, Ivan Anchors, PA-C  Nutritional Supplements (FEEDING SUPPLEMENT, PROSOURCE TF,) liquid Place 45 mLs into feeding tube 2 (two) times daily. 07/20/20   Love, Ivan Anchors, PA-C  ondansetron (ZOFRAN) 4 MG tablet Place 1 tablet (4 mg total) into feeding tube every 8 (eight) hours as needed for nausea, vomiting or refractory nausea / vomiting. 07/20/20   Love, Ivan Anchors, PA-C  polycarbophil (FIBERCON) 625 MG tablet Place 1 tablet (625 mg total) into feeding tube daily. 07/20/20   Love, Ivan Anchors, PA-C  rivaroxaban (XARELTO) 10 MG TABS tablet Take one tablet per tube daily 08/08/20   [name], MD  saccharomyces boulardii (FLORASTOR) 250 MG capsule Place 1 capsule (250 mg total) into feeding tube 2 (two) times daily. 07/20/20   Love, Ivan Anchors, PA-C  sennosides (SENOKOT) 8.8 MG/5ML syrup Place 5 mLs into feeding tube daily at 6 (six) AM.  07/20/20   Love, Ivan Anchors, PA-C  simethicone (MYLICON) 40 XB/3.5HG drops Place 1.2 mLs (80 mg total) into feeding tube 4 (four) times daily. 07/20/20   Love, Ivan Anchors, PA-C  traZODone (DESYREL) 50 MG tablet Place 1 tablet (50 mg total) into feeding  tube at bedtime. 10/23/20   Jon Billings, NP  Water For Irrigation, Sterile (FREE WATER) SOLN Place 400 mLs into feeding tube every 4 (four) hours. Patient taking differently: Place 300 mLs into feeding tube every 4 (four) hours. 07/20/20   Love, Ivan Anchors, PA-C  zinc sulfate 220 (50 Zn) MG capsule Place 1 capsule (220 mg total) into feeding tube daily. 07/20/20   Bary Leriche, PA-C         Critical care provider statement:    Critical care time (minutes):  40   Critical care time was exclusive of:  Separately billable procedures and  treating other patients   Critical care was necessary to treat or prevent imminent or  life-threatening deterioration of the following conditions:  mucu plugging, acute respiratory distress with hypoxemia, possible sepsis   Critical care was time spent personally by me on the following  activities:  Development of treatment plan with patient or surrogate,  discussions with consultants, evaluation of patient's response to  treatment, examination of patient, obtaining history from patient or  surrogate, ordering and performing treatments and interventions, ordering  and review of laboratory studies and re-evaluation of patient's condition   I assumed direction of critical care for this patient from another  provider in my specialty: no

## 2020-12-13 NOTE — Progress Notes (Signed)
ID Pt awake Still has lot of secretions Yesterday we added cipro for dual pseudomonas coverage  LDA  PICC placed 12/12/20   Patient Vitals for the past 24 hrs:  BP Temp Temp src Pulse Resp SpO2 Height Weight  12/13/20 1103 -- -- -- -- -- 99 % -- --  12/13/20 1100 128/71 97.7 F (36.5 C) -- 78 -- 100 % -- --  12/13/20 1000 118/61 97.88 F (36.6 C) -- 73 -- 99 % -- --  12/13/20 0900 124/62 98.06 F (36.7 C) -- (!) 38 -- 97 % -- --  12/13/20 0808 -- -- -- -- -- 98 % -- --  12/13/20 0800 (!) 105/57 98.06 F (36.7 C) Bladder (!) 39 (!) 26 96 % -- --  12/13/20 0700 (!) 98/53 98.06 F (36.7 C) -- (!) 35 (!) 28 97 % -- --  12/13/20 0600 (!) 101/52 98.24 F (36.8 C) -- (!) 40 (!) 30 97 % -- --  12/13/20 0500 (!) 107/55 98.42 F (36.9 C) -- (!) 36 (!) 28 97 % -- 124.9 kg  12/13/20 0448 -- -- -- -- -- 97 % 6' 5.01" (1.956 m) --  12/12/20 2354 -- -- -- -- -- 98 % 6' 5.01" (1.956 m) --  12/12/20 2200 126/62 99.32 F (37.4 C) -- 83 -- 98 % -- --  12/12/20 1916 -- -- -- -- -- 97 % 6' 5.01" (1.956 m) --  12/12/20 1800 (!) 121/57 98.78 F (37.1 C) -- 81 -- 99 % -- --  12/12/20 1710 -- -- -- -- -- 98 % -- --  12/12/20 1700 102/67 98.6 F (37 C) -- (!) 39 -- 99 % -- --  12/12/20 1600 134/60 98.6 F (37 C) Bladder (!) 40 -- 98 % -- --  12/12/20 1500 114/62 98.42 F (36.9 C) -- (!) 42 (!) 25 98 % -- --  12/12/20 1400 116/60 97.88 F (36.6 C) -- 80 (!) 30 99 % -- --  12/12/20 1300 123/61 -- -- 79 (!) 46 98 % -- --  12/12/20 1200 (!) 110/56 -- -- 80 (!) 30 99 % -- --   Awake, but lethargic Chest b/l air entry- crepts [id number] abd peg in place Foley catheter Sacral wound Edema legs  Labs CBC Latest Ref Rng & Units 12/13/2020 12/12/2020 12/11/2020  WBC 4.0 - 10.5 K/uL 12.8(H) 9.8 11.2(H)  Hemoglobin 13.0 - 17.0 g/dL 7.6(L) 7.3(L) 7.5(L)  Hematocrit 39.0 - 52.0 % 24.1(L) 23.9(L) 23.6(L)  Platelets 150 - 400 K/uL 139(L) 98(L) 110(L)   CMP Latest Ref Rng & Units 12/13/2020 12/12/2020  12/11/2020  Glucose 70 - 99 mg/dL 170(H) 167(H) 170(H)  BUN 8 - 23 mg/dL 40(H) 40(H) 44(H)  Creatinine 0.61 - 1.24 mg/dL 0.70 0.89 1.09  Sodium 135 - 145 mmol/L 151(H) 155(H) 154(H)  Potassium 3.5 - 5.1 mmol/L 3.3(L) 3.6 3.4(L)  Chloride 98 - 111 mmol/L 119(H) 122(H) 126(H)  CO2 22 - 32 mmol/L _0 Calcium 8.9 - 10.3 mg/dL 9.1 9.1 9.1  Total Protein 6.5 - 8.1 g/dL - - -  Total Bilirubin 0.3 - 1.2 mg/dL - - -  Alkaline Phos 38 - 126 U/L - - -  AST 15 - 41 U/L - - -  ALT 0 - 44 U/L - - -     Assessment/plan Polymicrobial bacteremia with sepsis.  MRSA and Enterococcus faecalis Enterobacter cloacae in blood culture.  Continue vancomycin and cefepime Patient has multiple sources for the infection. He has sacral decubitus ulcer  He has tracheostomy and HCAP He also has a Foley catheter.  He is colonized with multiple bacteria.  Pseudomonas in the BAL and hence being treated as HCAP with cefepime and ciprofloxacin because of 2 strains of Pseudomonas  Foley catheter is also colonized with Pseudomonas.  Respiratory distress mucous plugs and secretions.  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  Status post decompressive surgery in August 2021 at [facility].  Tracheostomy  PEG Discussed the management with the nurse

## 2020-12-13 NOTE — Consult Note (Addendum)
[place] for Electrolyte Monitoring and Replacement   Recent Labs: Potassium (mmol/L)  Date Value  12/13/2020 3.3 (L)   Magnesium (mg/dL)  Date Value  12/13/2020 2.2   Calcium (mg/dL)  Date Value  12/13/2020 9.1   Albumin (g/dL)  Date Value  12/07/2020 3.1 (L)  02/01/2020 4.4   Phosphorus (mg/dL)  Date Value  12/13/2020 2.6   Sodium (mmol/L)  Date Value  12/13/2020 151 (H)  02/01/2020 143   Assessment: Patient is a 70 y/o M presented with sepsis. Pt presented with complaints of recurrent UTI and hematuria. 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 Pt has chronic tracheostomy and PEG tube. PMH includes HTN and HLD. Pharmacy has been consulted to assist with electrolyte monitoring and replacement as indicated. Scr trending down 1.87 > 1.66>1.09>0.89 > 0.7.  Sodium improving 155 > 151  Nutrition: tube feeds @ 70 mL/hr   MIVF: D5 @ 50 mL/hr  Diuretics: Furosemide 40 mg IV x 1 dose 3/22  Desmopressin 25mcg IV BID started 3/22     Goal of Therapy:  Electrolytes within normal limits  Plan:  --Na 151 (improving) - continue free water 200 mL per tube q2h and D5 MIVF. Prosource tube feeds decreased from twice daily to daily on 3/22. Started on desmopressin 3/22. Continue to monitor.  --K 3.3 - NP ordered KCl 10 mEq IV x 4 doses and Klor-con 20 mEq x 2 doses --Follow-up electrolytes with AM labs  Doreatha Massed, Student-PharmD 12/13/2020 7:52 AM

## 2020-12-14 DIAGNOSIS — R6520 Severe sepsis without septic shock: Secondary | ICD-10-CM

## 2020-12-14 DIAGNOSIS — Z1630 Resistance to unspecified antimicrobial drugs: Secondary | ICD-10-CM

## 2020-12-14 DIAGNOSIS — A499 Bacterial infection, unspecified: Secondary | ICD-10-CM

## 2020-12-14 DIAGNOSIS — E870 Hyperosmolality and hypernatremia: Secondary | ICD-10-CM

## 2020-12-14 LAB — GLUCOSE, CAPILLARY
Glucose-Capillary: 128 mg/dL — ABNORMAL HIGH (ref 70–99)
Glucose-Capillary: 134 mg/dL — ABNORMAL HIGH (ref 70–99)
Glucose-Capillary: 144 mg/dL — ABNORMAL HIGH (ref 70–99)
Glucose-Capillary: 146 mg/dL — ABNORMAL HIGH (ref 70–99)
Glucose-Capillary: 168 mg/dL — ABNORMAL HIGH (ref 70–99)
Glucose-Capillary: 190 mg/dL — ABNORMAL HIGH (ref 70–99)

## 2020-12-14 LAB — BASIC METABOLIC PANEL
Anion gap: 4 — ABNORMAL LOW (ref 5–15)
BUN: 36 mg/dL — ABNORMAL HIGH (ref 8–23)
CO2: 27 mmol/L (ref 22–32)
Calcium: 9 mg/dL (ref 8.9–10.3)
Chloride: 117 mmol/L — ABNORMAL HIGH (ref 98–111)
Creatinine, Ser: 0.65 mg/dL (ref 0.61–1.24)
GFR, Estimated: >60 mL/min (ref >60)
Glucose, Bld: 187 mg/dL — ABNORMAL HIGH (ref 70–99)
Potassium: 3.4 mmol/L — ABNORMAL LOW (ref 3.5–5.1)
Sodium: 148 mmol/L — ABNORMAL HIGH (ref 135–145)

## 2020-12-14 LAB — PHOSPHORUS: Phosphorus: 2.2 mg/dL — ABNORMAL LOW (ref 2.5–4.6)

## 2020-12-14 LAB — CBC WITH DIFFERENTIAL/PLATELET
Abs Immature Granulocytes: 0.16 10*3/uL — ABNORMAL HIGH (ref 0.00–0.07)
Basophils Absolute: 0 10*3/uL (ref 0.0–0.1)
Basophils Relative: 0 %
Eosinophils Absolute: 0.5 10*3/uL (ref 0.0–0.5)
Eosinophils Relative: 4 %
HCT: 22.6 % — ABNORMAL LOW (ref 39.0–52.0)
Hemoglobin: 7 g/dL — ABNORMAL LOW (ref 13.0–17.0)
Immature Granulocytes: 1 %
Lymphocytes Relative: 9 %
Lymphs Abs: 1.2 10*3/uL (ref 0.7–4.0)
MCH: 29.3 pg (ref 26.0–34.0)
MCHC: 31 g/dL (ref 30.0–36.0)
MCV: 94.6 fL (ref 80.0–100.0)
Monocytes Absolute: 1.1 10*3/uL — ABNORMAL HIGH (ref 0.1–1.0)
Monocytes Relative: 8 %
Neutro Abs: 10.2 10*3/uL — ABNORMAL HIGH (ref 1.7–7.7)
Neutrophils Relative %: 78 %
Platelets: 134 10*3/uL — ABNORMAL LOW (ref 150–400)
RBC: 2.39 MIL/uL — ABNORMAL LOW (ref 4.22–5.81)
RDW: 16.6 % — ABNORMAL HIGH (ref 11.5–15.5)
WBC: 13.1 10*3/uL — ABNORMAL HIGH (ref 4.0–10.5)
nRBC: 0.5 % — ABNORMAL HIGH (ref 0.0–0.2)

## 2020-12-14 LAB — ASPERGILLUS ANTIGEN, BAL/SERUM

## 2020-12-14 LAB — ANAEROBIC CULTURE W GRAM STAIN

## 2020-12-14 LAB — MAGNESIUM: Magnesium: 2 mg/dL (ref 1.7–2.4)

## 2020-12-14 MED ORDER — HYDROMORPHONE HCL 1 MG/ML IJ SOLN: 1.0000 mg | INTRAMUSCULAR | Status: DC | PRN

## 2020-12-14 MED ORDER — K PHOS MONO-SOD PHOS DI & MONO 155-852-130 MG PO TABS
500.0000 mg | ORAL_TABLET | Freq: Four times a day (QID) | ORAL | Status: AC
  Administered 2020-12-14 (×4): 500 mg
  Filled 2020-12-14 (×5): qty 2

## 2020-12-14 MED ORDER — INSULIN ASPART 100 UNIT/ML ~~LOC~~ SOLN
0.0000 [IU] | SUBCUTANEOUS | Status: DC
  Administered 2020-12-14 (×3): 2 [IU] via SUBCUTANEOUS
  Administered 2020-12-15: 3 [IU] via SUBCUTANEOUS
  Administered 2020-12-15 (×2): 2 [IU] via SUBCUTANEOUS
  Administered 2020-12-15 – 2020-12-16 (×5): 3 [IU] via SUBCUTANEOUS
  Administered 2020-12-16 (×3): 2 [IU] via SUBCUTANEOUS
  Administered 2020-12-17: 3 [IU] via SUBCUTANEOUS
  Administered 2020-12-17: 2 [IU] via SUBCUTANEOUS
  Administered 2020-12-17 (×4): 3 [IU] via SUBCUTANEOUS
  Administered 2020-12-18 (×4): 2 [IU] via SUBCUTANEOUS
  Administered 2020-12-18: 3 [IU] via SUBCUTANEOUS
  Administered 2020-12-18 – 2020-12-19 (×4): 2 [IU] via SUBCUTANEOUS
  Administered 2020-12-19: 3 [IU] via SUBCUTANEOUS
  Administered 2020-12-19 – 2020-12-24 (×23): 2 [IU] via SUBCUTANEOUS
  Administered 2020-12-24 (×2): 1 [IU] via SUBCUTANEOUS
  Administered 2020-12-25 (×4): 2 [IU] via SUBCUTANEOUS
  Administered 2020-12-26: 3 [IU] via SUBCUTANEOUS
  Administered 2020-12-26 – 2020-12-27 (×6): 2 [IU] via SUBCUTANEOUS
  Administered 2020-12-27: 3 [IU] via SUBCUTANEOUS
  Administered 2020-12-27 – 2020-12-29 (×7): 2 [IU] via SUBCUTANEOUS
  Administered 2020-12-30 (×2): 3 [IU] via SUBCUTANEOUS
  Administered 2020-12-30 – 2020-12-31 (×5): 2 [IU] via SUBCUTANEOUS
  Administered 2020-12-31: 3 [IU] via SUBCUTANEOUS
  Administered 2020-12-31 – 2021-01-01 (×3): 2 [IU] via SUBCUTANEOUS
  Administered 2021-01-01: 3 [IU] via SUBCUTANEOUS
  Administered 2021-01-01 – 2021-01-05 (×13): 2 [IU] via SUBCUTANEOUS
  Filled 2020-12-14 (×98): qty 1

## 2020-12-14 MED ORDER — POTASSIUM CHLORIDE 20 MEQ PO PACK
20.0000 meq | PACK | ORAL | Status: AC
  Administered 2020-12-14 (×2): 20 meq
  Filled 2020-12-14 (×2): qty 1

## 2020-12-14 NOTE — Consult Note (Signed)
[place] for Electrolyte Monitoring and Replacement   Recent Labs: Potassium (mmol/L)  Date Value  12/14/2020 3.4 (L)   Magnesium (mg/dL)  Date Value  12/14/2020 2.0   Calcium (mg/dL)  Date Value  12/14/2020 9.0   Albumin (g/dL)  Date Value  12/07/2020 3.1 (L)  02/01/2020 4.4   Phosphorus (mg/dL)  Date Value  12/14/2020 2.2 (L)   Sodium (mmol/L)  Date Value  12/14/2020 148 (H)  02/01/2020 143   Assessment: Patient is a 70 y/o M presented with sepsis. Pt presented with complaints of recurrent UTI and hematuria. 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 Pt has chronic tracheostomy and PEG tube. PMH includes HTN and HLD. Pharmacy has been consulted to assist with electrolyte monitoring and replacement as indicated. Scr trending down 1.87 > 1.66>1.09>0.89 > 0.7 > 0.65.   Sodium improving 155 > 151 > 148  Nutrition: tube feeds @ 70 mL/hr, prosource tube feeds once daily   MIVF: D5 @ 50 mL/hr  Diuretics: Furosemide 40 mg IV x 1 dose 3/22  Desmopressin 71mcg IV BID started 3/22   Ca (corrected): 9.72 (WNL)    Goal of Therapy:  Electrolytes within normal limits  Plan:  -- Na 148 (improving) - continue free water 200 mL per tube q2h, desmopressin BID, D5 MIVF.   -- K 3.4 - NP ordered Klor-con 20 mEq x 2 doses -- Phos 2.2 - will give 500mg  x 4 doses Kphos tab through tube.  -- Follow-up electrolytes with AM labs  Doreatha Massed, Student-PharmD 12/14/2020 7:17 AM

## 2020-12-14 NOTE — Progress Notes (Signed)
NAME:  Ryan Day, MRN:  [id number], DOB:  08-11-1951, LOS: 7 ADMISSION DATE:  12/07/2020, CONSULTATION DATE: 12/07/2020 REFERRING MD: Dr. Tamala Julian, CHIEF COMPLAINT: Hematuria   History of Present Illness:  This is a 70 yo quadriplegic male with a chronic tracheostomy who presented to [facility] ER on 03/17 with hematuria.  According to pts family last week pt noted to have sediment in his urine at home concerning for UTI.  He had a foley catheter placed 2 weeks ago and completed a 7 day course of abx therapy due to UTI.  Due to concerns of recurrent UTI pts family contacted pts PCP office last week, and heard from his PCP this week and were awaiting urine culture results.  However, pt noted to have hematuria yesterday and today along with sinus tachycardia hr 120's.  Pts daughter is a Marine scientist and due to concern of sepsis pt transported to the ER via EMS for further evaluation and treatment.    ED course Upon arrival to the ER pts O2 sats were 75-80% on RA, therefore pt placed on NRB with O2 sats initially increasing to 100%.  Pt transitioned to trach collar with initial O2 sats in the low 90's.  However, pt noted to have mucous plugging resulting in hypoxia requiring exchange of tracheostomy from a cuffless trach to a size 6 mm cuffed trach and pt placed on mechanical ventilation.  ER vital signs were: temp 101.7 rectally, sbp 60-80's, and hr 130's. Lab results revealed Na+ 153, chloride 119, glucose 147, lactic acid 2.8, wbc 2.0, hbg 11.1, and UA positive for UTI.  CXR negative and COVID-19/Influenza PCR results pending.  Sepsis protocol initiated and pt received cefepime, vancomycin, and 2L LR bolus.  Pt remained severely hypotensive requiring levophed gtt and right femoral central line placement.  PCCM team contacted for ICU admission.    12/08/20- patient is improved some he is communicative.  Wife [name] at bedside we reviewed care plan.   12/10/20- patient is improved he is down to 39mg on levophed and 30%Fio2  on ventilator, mentating well. Reviewed careplan with wife LVaughan Bastatoday.  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 C5 Cervical Fracture (following a mechanical fall at home) s/p C3-6 Laminectomy and Posterior Instrumentation and Fusion 08/4  Quadriplegia  Spinal Cord Compression  Chronic Tracheostomy and PEG    Significant Hospital Events: Including procedures, antibiotic start and stop dates in addition to other pertinent events   03/17: Pt admitted to ICU with urosepsis requiring vasopressors and acute on chronic hypoxic respiratory failure secondary to mucous plugging requiring exchange of cuffless trach to size 6 mm cuffed trach to be placed on mechanical ventilation  03/17: Right femoral central line placed by ER physician  03/17: CT Abd Pelvis findings with cystitis. Correlation with urinalysis is Recommended. Mild to moderate severity bibasilar atelectasis and/or Infiltrate. Sigmoid diverticulosis. Sacral decubitus ulcer, as described above. MRI correlation is recommended, as sequelae associated with acute osteomyelitis cannot be excluded. Aortic atherosclerosis. 03/17: CT Head revealed mild chronic ischemic white matter disease. No acute intracranial abnormality seen 03/17: Vancomycin x1 dose and Cefepime x1 dose 03/17: Zosyn>>  Interim History / Subjective:  Pt lethargic no signs of respiratory distress mechanically intubated via tracheostomy   Objective   Blood pressure 130/70, pulse 75, temperature (!) 97.2 F (36.2 C), temperature source Axillary, resp. rate (!) 27, height 6' 5.01" (1.956 m), weight 124.9  kg, SpO2 97 %.    Vent Mode: PRVC FiO2 (%):  [30 %-35 %] 35 % Set Rate:  [24 bmp] 24 bmp Vt Set:  [500 mL] 500 mL PEEP:  [8 cmH20] 8 cmH20 Plateau Pressure:  [17 cmH20-20 cmH20] 18 cmH20   Intake/Output Summary (Last 24 hours) at 12/14/2020  1515 Last data filed at 12/14/2020 9326 Gross per 24 hour  Intake 2279.6 ml  Output 2500 ml  Net -220.4 ml   Filed Weights   12/11/20 0500 12/13/20 0500 12/14/20 0500  Weight: 124.9 kg 124.9 kg 124.9 kg   Examination: General: chronically appearing male, NAD receiving mechanical ventilation via trach  HENT: supple, no JVD  Lungs: diminished throughout, even, non labored  Cardiovascular: sinus tach, no R/G, 2+ radial/1+ distal pulses, 2+ bilateral lower extremity edema  Abdomen: hypoactive BS x, soft, non distended  Extremities: quadriplegic Neuro: communicates to verbal language Skin: sacral spine decubitus  GU: chronic foley in place   Labs/imaging personally reviewed   CT Abd/Pelvis CXR CT Head  UA  Resolved Hospital Problem list   N/A  Assessment & Plan:   RESPIRATORY:  3/22                                                                         3/19   Acute on chronic hypoxic respiratory failure secondary to mucous plugging which is chronic   Mechanical ventilation via chronic tracheostomy        S/p bronch - many GPCs , cleared bilateral airways   Full vent support for now-vent settings reviewed and established  SBT once all parameters met  VAP bundle implemented   Aggressive pulmonary hygiene   Mucus plugging last night with distress, alleviated by PPB-ambu and lavage  CXR worsening infiltrates noted above on 3/22  Still with thick secretions, may need bronch in another day or two depending on CXR  Appreciate ID  CARDIOVASCULAR:  ECHO  1.EF 55 to 60%. LVH G2DD  2. RV size and function normal   3. The mitral valve is normal in structure. No MR   4. The aortic valve is grossly normal. No AR   Septic shock  Continuous telemetry monitoring   Aggressive fluid resuscitation and levophed/vasopressin gtts to maintain map >65  RENAL: Lab Results  Component Value Date   CREATININE 0.65 12/14/2020   BUN 36 (H) 12/14/2020   NA 148 (H) 12/14/2020    K 3.4 (L) 12/14/2020   CL 117 (H) 12/14/2020   CO2 27 12/14/2020    Intake/Output Summary (Last 24 hours) at 12/14/2020 1515 Last data filed at 12/14/2020 0950 Gross per 24 hour  Intake 2279.6 ml  Output 2500 ml  Net -220.4 ml   Net IO Since Admission: -7,953.59 mL [12/14/20 1515]   Mild acute renal failure secondary to ATN  Anion Gap Metabolic Acidosis, Severe lactic acidosis   Trend BMP and lactic acid, gap resolved, lactic 2.3  Replace electrolytes as indicated   Monitor UOP, despite all the free water and D5W Na remains elevated  Trial vasopressin and monitor Uop, finally Na <150  Avoid nephrotoxic medications   ID: Lab Results  Component Value Date   WBC 13.1 (H) 12/14/2020    Urosepsis secondary  to chronic foley   Leukopenia  Septic Shock   Trend WBC and monitor fever curve   PCT skyrocketed >150 from 3/18, down significantly  Resistance pattern of the Pseudomonas vs Enterobacter, switch to Cefepipme  Appreciate ID consultant   Foley catheter exchanged in the ED  Sacral spine pressure ulcer-pt previously had wound vac in place with removal   Wound Care consulted appreciate input   HEME: Lab Results  Component Value Date   HCT 22.6 (L) 12/14/2020    Anemia without obvious acute blood loss  Trend CBC   Monitor for s/sx of bleeding  Transfuse for hgb <7  NEURO:  Quadriplegia-bed bound  Acute toxic encephalopathy   Frequent reorientation   Will attempt to avoid sedating medications   Best practice (evaluated daily)  Diet:  NPO Pain/Anxiety/Delirium protocol (if indicated): Yes (RASS goal 0) VAP protocol (if indicated): Yes DVT prophylaxis: Systemic AC GI prophylaxis: H2B Glucose control:  SSI No Central venous access:  Yes, and it is still needed Arterial line:  N/A Foley:  Yes, and it is still needed Mobility:  bed rest  PT consulted: N/A Last date of multidisciplinary goals of care discussion [N/A] Code Status:  full  code Disposition: ICU   Labs   CBC: Recent Labs  Lab 12/07/20 1725 12/08/20 0450 12/10/20 0513 12/11/20 0547 12/12/20 0542 12/13/20 0550 12/14/20 0302  WBC 2.0*   < > 15.7* 11.2* 9.8 12.8* 13.1*  NEUTROABS 1.3*  --   --   --   --  9.5* 10.2*  HGB 11.1*   < > 7.6* 7.5* 7.3* 7.6* 7.0*  HCT 36.1*   < > 25.0* 23.6* 23.9* 24.1* 22.6*  MCV 95.3   < > 95.4 93.7 95.2 93.1 94.6  PLT 213   < > 126* 110* 98* 139* 134*   < > = values in this interval not displayed.    Basic Metabolic Panel: Recent Labs  Lab 12/10/20 0513 12/10/20 1548 12/11/20 0547 12/11/20 1942 12/12/20 0542 12/13/20 0550 12/14/20 0302  NA 155* 155* 154*  --  155* 151* 148*  K 3.2* 3.1* 3.4*  --  3.6 3.3* 3.4*  CL 124*  --  126*  --  122* 119* 117*  CO2 23  --  25  --  '28 27 27  ' GLUCOSE 132*  --  170*  --  167* 170* 187*  BUN 45*  --  44*  --  40* 40* 36*  CREATININE 1.20  --  1.09  --  0.89 0.70 0.65  CALCIUM 9.3  --  9.1  --  9.1 9.1 9.0  MG 2.5*  --  2.5*  --  2.3 2.2 2.0  PHOS 2.0*  --  1.3* 3.1 2.5 2.6 2.2*   GFR: Estimated Creatinine Clearance: 129.3 mL/min (by C-G formula based on SCr of 0.65 mg/dL). Recent Labs  Lab 12/07/20 1725 12/07/20 1933 12/08/20 0450 12/08/20 0921 12/08/20 1151 12/08/20 1927 12/08/20 2337 12/09/20 0454 12/10/20 0513 12/11/20 0547 12/12/20 0542 12/13/20 0550 12/14/20 0302  PROCALCITON 0.23  --  >150.00  --   --   --   --  >150.00  --   --   --  32.49  --   WBC 2.0*  --  17.6*  --   --   --   --  15.2*   < > 11.2* 9.8 12.8* 13.1*  LATICACIDVEN 2.8*   < >  --    < > 7.4* 5.1* 3.3* 2.3*  --   --   --   --   --    < > =  values in this interval not displayed.    Liver Function Tests: Recent Labs  Lab 12/07/20 1725  AST 32  ALT 26  ALKPHOS 179*  BILITOT 1.2  PROT 8.4*  ALBUMIN 3.1*   No results for input(s): LIPASE, AMYLASE in the last 168 hours. No results for input(s): AMMONIA in the last 168 hours.  ABG    Component Value Date/Time   PHART 7.38  12/08/2020 0500   PCO2ART 42 12/08/2020 0500   PO2ART 115 (H) 12/08/2020 0500   HCO3 24.8 12/08/2020 0500   ACIDBASEDEF 0.4 12/08/2020 0500   O2SAT 98.4 12/08/2020 0500     Coagulation Profile: No results for input(s): INR, PROTIME in the last 168 hours.  Cardiac Enzymes: No results for input(s): CKTOTAL, CKMB, CKMBINDEX, TROPONINI in the last 168 hours.  HbA1C: Hgb A1c MFr Bld  Date/Time Value Ref Range Status  06/09/2020 07:00 AM 5.8 (H) 4.8 - 5.6 % Final    Comment:    (NOTE) Pre diabetes:          5.7%-6.4%  Diabetes:              >6.4%  Glycemic control for   <7.0% adults with diabetes     CBG: Recent Labs  Lab 12/13/20 1112 12/13/20 1514 12/14/20 0014 12/14/20 0714 12/14/20 1109  GLUCAP 151* 162* 168* 190* 144*    Review of Systems:   Unable to assess pt chronically trached currently requiring mechanical ventilation   Past Medical History:  He,  has a past medical history of Acute on chronic respiratory failure with hypoxia ([place]), Acute renal injury due to hypovolemia [facility]), Autonomic instability, Cardiac arrest ([place]), Cervical spinal cord injury, sequela ([place]), Elevated alkaline phosphatase level, 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   Surgical History:   Past Surgical History:  Procedure Laterality Date  . APPLICATION OF WOUND VAC N/A 08/16/2020   Procedure: APPLICATION OF WOUND VAC;  Surgeon: Fredirick Maudlin, MD;  Location: [facility] ORS;  Service: General;  Laterality: N/A;  . COLONOSCOPY WITH PROPOFOL N/A 12/05/2017   Procedure: COLONOSCOPY WITH PROPOFOL;  Surgeon: Lin Landsman, MD;  Location: [facility] ENDOSCOPY;  Service: Gastroenterology;  Laterality: N/A;  . ESOPHAGOGASTRODUODENOSCOPY  12/05/2017   Procedure: ESOPHAGOGASTRODUODENOSCOPY (EGD);  Surgeon: Lin Landsman, MD;  Location: [place];  Service: Gastroenterology;;  . INCISION AND DRAINAGE ABSCESS N/A 08/16/2020   Procedure: INCISION AND  DRAINAGE ABSCESS, SACRAL;  Surgeon: Fredirick Maudlin, MD;  Location: [facility] ORS;  Service: General;  Laterality: N/A;     Social History:   reports that he has never smoked. He has never used smokeless tobacco. He reports previous alcohol use. He reports that he does not use drugs.   Family History:  His family history includes Alcohol abuse in his brother; Cancer in his brother and father; Heart disease (age of onset: 35) in his brother; Liver cancer in his brother.   Allergies Allergies  Allergen Reactions  . Shrimp (Diagnostic)     Experiences facial droop when eating seafood     Home Medications  Prior to Admission medications   Medication Sig Start Date End Date Taking? Authorizing Provider  acetaminophen (TYLENOL) 160 MG/5ML solution Place 10-20 mLs (320-640 mg total) into feeding tube every 4 (four) hours as needed for mild pain. 07/18/20   Love, Ivan Anchors, PA-C  ascorbic acid (VITAMIN C) 500 MG tablet Place 1 tablet (500 mg total) into feeding tube 2 (two) times daily. 07/18/20   Love, Ivan Anchors,  PA-C  baclofen (LIORESAL) 10 MG tablet Take 1 tablet (10 mg total) by mouth 3 (three) times daily. 11/12/20   Johnson, Megan P, DO  bisacodyl (DULCOLAX) 10 MG suppository Place 1 suppository (10 mg total) rectally at bedtime. 07/20/20   Love, Ivan Anchors, PA-C  chlorhexidine (PERIDEX) 0.12 % solution 15 mLs by Mouth Rinse route 2 (two) times daily. 07/20/20   Love, Ivan Anchors, PA-C  collagenase (SANTYL) ointment Apply topically daily. Patient not taking: Reported on 11/07/2020 07/20/20   Love, Ivan Anchors, PA-C  diclofenac Sodium (VOLTAREN) 1 % GEL Apply 4 g topically 4 (four) times daily. 08/31/20   Kathrine Haddock, NP  famotidine (PEPCID) 20 MG tablet Place 1 tablet (20 mg total) into feeding tube daily. 07/20/20   Love, Ivan Anchors, PA-C  ferrous sulfate 300 (60 Fe) MG/5ML syrup Place 1.3 mLs (78 mg total) into feeding tube daily. 07/20/20   Love, Ivan Anchors, PA-C  fludrocortisone (FLORINEF) 0.1 MG tablet  Take 200 mcg by mouth daily. 11/02/20   [provider]  FLUoxetine (PROZAC) 20 MG/5ML solution Place 10 mLs (40 mg total) into feeding tube daily. 11/22/20   Vigg, Avanti, MD  guaiFENesin 200 MG tablet Place 2 tablets (400 mg total) into feeding tube every 6 (six) hours. 07/20/20   Love, Ivan Anchors, PA-C  lidocaine (LIDODERM) 5 % Place 1 patch onto the skin daily. Remove & Discard patch within 12 hours or as directed by MD 07/20/20   Love, Ivan Anchors, PA-C  midodrine (PROAMATINE) 10 MG tablet Place 1 tablet (10 mg total) into feeding tube 3 (three) times daily with meals. 11/04/20   Park Liter P, DO  Mouthwashes (MOUTH RINSE) LIQD solution 15 mLs by Mouth Rinse route 2 times daily at 12 noon and 4 pm. 07/20/20   Love, Ivan Anchors, PA-C  Multiple Vitamin (MULTIVITAMIN) LIQD Place 5 mLs into feeding tube daily. 07/20/20   Love, Ivan Anchors, PA-C  nutrition supplement, JUVEN, (JUVEN) PACK Place 1 packet into feeding tube 2 (two) times daily between meals. 07/20/20   Love, Ivan Anchors, PA-C  Nutritional Supplements (FEEDING SUPPLEMENT, OSMOLITE 1.5 CAL,) LIQD Place 474 mLs into feeding tube 4 (four) times daily. 07/20/20   Love, Ivan Anchors, PA-C  Nutritional Supplements (FEEDING SUPPLEMENT, PROSOURCE TF,) liquid Place 45 mLs into feeding tube 2 (two) times daily. 07/20/20   Love, Ivan Anchors, PA-C  ondansetron (ZOFRAN) 4 MG tablet Place 1 tablet (4 mg total) into feeding tube every 8 (eight) hours as needed for nausea, vomiting or refractory nausea / vomiting. 07/20/20   Love, Ivan Anchors, PA-C  polycarbophil (FIBERCON) 625 MG tablet Place 1 tablet (625 mg total) into feeding tube daily. 07/20/20   Love, Ivan Anchors, PA-C  rivaroxaban (XARELTO) 10 MG TABS tablet Take one tablet per tube daily 08/08/20   [name], MD  saccharomyces boulardii (FLORASTOR) 250 MG capsule Place 1 capsule (250 mg total) into feeding tube 2 (two) times daily. 07/20/20   Love, Ivan Anchors, PA-C  sennosides (SENOKOT) 8.8 MG/5ML syrup Place 5  mLs into feeding tube daily at 6 (six) AM. 07/20/20   Love, Ivan Anchors, PA-C  simethicone (MYLICON) 40 FK/8.1EX drops Place 1.2 mLs (80 mg total) into feeding tube 4 (four) times daily. 07/20/20   Love, Ivan Anchors, PA-C  traZODone (DESYREL) 50 MG tablet Place 1 tablet (50 mg total) into feeding tube at bedtime. 10/23/20   Jon Billings, NP  Water For Irrigation, Sterile (FREE WATER) SOLN Place 400 mLs into  feeding tube every 4 (four) hours. Patient taking differently: Place 300 mLs into feeding tube every 4 (four) hours. 07/20/20   Love, Ivan Anchors, PA-C  zinc sulfate 220 (50 Zn) MG capsule Place 1 capsule (220 mg total) into feeding tube daily. 07/20/20   Bary Leriche, PA-C         Critical care provider statement:    Critical care time (minutes):  40   Critical care time was exclusive of:  Separately billable procedures and  treating other patients   Critical care was necessary to treat or prevent imminent or  life-threatening deterioration of the following conditions:  mucu plugging, acute respiratory distress with hypoxemia, possible sepsis   Critical care was time spent personally by me on the following  activities:  Development of treatment plan with patient or surrogate,  discussions with consultants, evaluation of patient's response to  treatment, examination of patient, obtaining history from patient or  surrogate, ordering and performing treatments and interventions, ordering  and review of laboratory studies and re-evaluation of patient's condition   I assumed direction of critical care for this patient from another  provider in my specialty: no

## 2020-12-15 DIAGNOSIS — R6520 Severe sepsis without septic shock: Secondary | ICD-10-CM | POA: Diagnosis not present

## 2020-12-15 DIAGNOSIS — R7881 Bacteremia: Secondary | ICD-10-CM | POA: Diagnosis not present

## 2020-12-15 DIAGNOSIS — R918 Other nonspecific abnormal finding of lung field: Secondary | ICD-10-CM | POA: Diagnosis not present

## 2020-12-15 DIAGNOSIS — A4102 Sepsis due to Methicillin resistant Staphylococcus aureus: Secondary | ICD-10-CM | POA: Diagnosis not present

## 2020-12-15 DIAGNOSIS — J9621 Acute and chronic respiratory failure with hypoxia: Secondary | ICD-10-CM | POA: Diagnosis not present

## 2020-12-15 DIAGNOSIS — E870 Hyperosmolality and hypernatremia: Secondary | ICD-10-CM | POA: Diagnosis not present

## 2020-12-15 DIAGNOSIS — B9689 Other specified bacterial agents as the cause of diseases classified elsewhere: Secondary | ICD-10-CM

## 2020-12-15 DIAGNOSIS — B9562 Methicillin resistant Staphylococcus aureus infection as the cause of diseases classified elsewhere: Secondary | ICD-10-CM | POA: Diagnosis not present

## 2020-12-15 LAB — AEROBIC/ANAEROBIC CULTURE W GRAM STAIN (SURGICAL/DEEP WOUND)

## 2020-12-15 LAB — PREPARE RBC (CROSSMATCH)

## 2020-12-15 LAB — CBC WITH DIFFERENTIAL/PLATELET
Abs Immature Granulocytes: 0.25 10*3/uL — ABNORMAL HIGH (ref 0.00–0.07)
Basophils Absolute: 0 10*3/uL (ref 0.0–0.1)
Basophils Relative: 0 %
Eosinophils Absolute: 0.5 10*3/uL (ref 0.0–0.5)
Eosinophils Relative: 3 %
HCT: 22.6 % — ABNORMAL LOW (ref 39.0–52.0)
Hemoglobin: 6.9 g/dL — ABNORMAL LOW (ref 13.0–17.0)
Immature Granulocytes: 2 %
Lymphocytes Relative: 9 %
Lymphs Abs: 1.2 10*3/uL (ref 0.7–4.0)
MCH: 28.9 pg (ref 26.0–34.0)
MCHC: 30.5 g/dL (ref 30.0–36.0)
MCV: 94.6 fL (ref 80.0–100.0)
Monocytes Absolute: 1 10*3/uL (ref 0.1–1.0)
Monocytes Relative: 7 %
Neutro Abs: 11.6 10*3/uL — ABNORMAL HIGH (ref 1.7–7.7)
Neutrophils Relative %: 79 %
Platelets: 156 10*3/uL (ref 150–400)
RBC: 2.39 MIL/uL — ABNORMAL LOW (ref 4.22–5.81)
RDW: 16.5 % — ABNORMAL HIGH (ref 11.5–15.5)
WBC: 14.6 10*3/uL — ABNORMAL HIGH (ref 4.0–10.5)
nRBC: 0.3 % — ABNORMAL HIGH (ref 0.0–0.2)

## 2020-12-15 LAB — BASIC METABOLIC PANEL
Anion gap: 4 — ABNORMAL LOW (ref 5–15)
BUN: 35 mg/dL — ABNORMAL HIGH (ref 8–23)
CO2: 28 mmol/L (ref 22–32)
Calcium: 9 mg/dL (ref 8.9–10.3)
Chloride: 113 mmol/L — ABNORMAL HIGH (ref 98–111)
Creatinine, Ser: 0.62 mg/dL (ref 0.61–1.24)
GFR, Estimated: >60 mL/min (ref >60)
Glucose, Bld: 160 mg/dL — ABNORMAL HIGH (ref 70–99)
Potassium: 3.4 mmol/L — ABNORMAL LOW (ref 3.5–5.1)
Sodium: 145 mmol/L (ref 135–145)

## 2020-12-15 LAB — GLUCOSE, CAPILLARY
Glucose-Capillary: 151 mg/dL — ABNORMAL HIGH (ref 70–99)
Glucose-Capillary: 175 mg/dL — ABNORMAL HIGH (ref 70–99)
Glucose-Capillary: 153 mg/dL — ABNORMAL HIGH (ref 70–99)
Glucose-Capillary: 148 mg/dL — ABNORMAL HIGH (ref 70–99)
Glucose-Capillary: 118 mg/dL — ABNORMAL HIGH (ref 70–99)

## 2020-12-15 LAB — MAGNESIUM: Magnesium: 1.9 mg/dL (ref 1.7–2.4)

## 2020-12-15 LAB — ABO/RH: ABO/RH(D): O POS

## 2020-12-15 LAB — PHOSPHORUS: Phosphorus: 3.2 mg/dL (ref 2.5–4.6)

## 2020-12-15 LAB — VANCOMYCIN, PEAK: Vancomycin Pk: 27 ug/mL — ABNORMAL LOW (ref 30–40)

## 2020-12-15 MED ORDER — POTASSIUM CHLORIDE 20 MEQ PO PACK
20.0000 meq | PACK | ORAL | Status: AC
  Administered 2020-12-15 (×3): 20 meq
  Filled 2020-12-15 (×3): qty 1

## 2020-12-15 MED ORDER — IPRATROPIUM-ALBUTEROL 0.5-2.5 (3) MG/3ML IN SOLN
3.0000 mL | Freq: Three times a day (TID) | RESPIRATORY_TRACT | Status: DC
  Administered 2020-12-15 – 2020-12-31 (×49): 3 mL via RESPIRATORY_TRACT
  Filled 2020-12-15 (×48): qty 3

## 2020-12-15 MED ORDER — SODIUM CHLORIDE 3 % IN NEBU
3.0000 mL | INHALATION_SOLUTION | Freq: Three times a day (TID) | RESPIRATORY_TRACT | Status: AC
  Administered 2020-12-15 – 2020-12-18 (×9): 3 mL via RESPIRATORY_TRACT
  Filled 2020-12-15 (×9): qty 4

## 2020-12-15 MED ORDER — DESMOPRESSIN ACETATE SPRAY 0.01 % NA SOLN
10.0000 ug | Freq: Two times a day (BID) | NASAL | Status: DC
  Administered 2020-12-15 – 2020-12-17 (×5): 10 ug via NASAL
  Filled 2020-12-15: qty 5

## 2020-12-15 MED ORDER — SODIUM CHLORIDE 0.9% IV SOLUTION: Freq: Once | INTRAVENOUS | Status: AC

## 2020-12-15 NOTE — Consult Note (Signed)
[place] for Electrolyte Monitoring and Replacement   Recent Labs: Potassium (mmol/L)  Date Value  12/15/2020 3.4 (L)   Magnesium (mg/dL)  Date Value  12/15/2020 1.9   Calcium (mg/dL)  Date Value  12/15/2020 9.0   Albumin (g/dL)  Date Value  12/07/2020 3.1 (L)  02/01/2020 4.4   Phosphorus (mg/dL)  Date Value  12/15/2020 3.2   Sodium (mmol/L)  Date Value  12/15/2020 145  02/01/2020 143   Corrected Ca: 9.7  Assessment: Patient is a 70 y/o M presented with sepsis. Pt presented with complaints of recurrent UTI and hematuria. 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 Pt has chronic tracheostomy and PEG tube. PMH includes HTN and HLD. Pharmacy has been consulted to assist with electrolyte monitoring and replacement as indicated. Scr trending down 1.87 > 1.66>1.09>0.89 > 0.7 > 0.65.   Sodium improving 155 > 151 > 148  Nutrition: tube feeds @ 70 mL/hr, prosource tube feeds once daily   MIVF: D5 @ 50 mL/hr  3/22: Desmopressin 86mcg IV BID  Goal of Therapy:  Electrolytes within normal limits  Plan:  -- Na 145 - continue free water 200 mL per tube q2h, desmopressin BID, D5 MIVF   -- K 3.4 - Pt received 40 mEq on 3/23 and 3/24 yet remained below goal of 3.5.  Will replace with KCl 20 mEq PO x 3 doses for a total of 60 mEq. -- Follow-up electrolytes with AM labs  Benn Moulder, PharmD Pharmacy Resident  12/15/2020 7:09 AM

## 2020-12-15 NOTE — Progress Notes (Signed)
ID No change in the patient's condition Continues to have secretions needing constant suctioning Tracheostomy On the vent BP 119/85   Pulse 62   Temp 98 F (36.7 C) (Axillary)   Resp (!) 23   Ht 6' 5.01" (1.956 m)   Wt 124.9 kg   SpO2 95%   BMI 32.65 kg/m    Patient awake Responds to commands PICC double-lumen on the right arm placed on 12/12/2020. Foley catheter present Gastrostomy tube Chest bilateral air entry Rhonchi and crypts present Heart sound tachycardia CNS is quadriplegia Back sacral decubitus  Clean and granulating  Bilateral heel eschars       Labs CBC Latest Ref Rng & Units 12/15/2020 12/14/2020 12/13/2020  WBC 4.0 - 10.5 K/uL 14.6(H) 13.1(H) 12.8(H)  Hemoglobin 13.0 - 17.0 g/dL 6.9(L) 7.0(L) 7.6(L)  Hematocrit 39.0 - 52.0 % 22.6(L) 22.6(L) 24.1(L)  Platelets 150 - 400 K/uL 156 134(L) 139(L)    CMP Latest Ref Rng & Units 12/15/2020 12/14/2020 12/13/2020  Glucose 70 - 99 mg/dL 160(H) 187(H) 170(H)  BUN 8 - 23 mg/dL 35(H) 36(H) 40(H)  Creatinine 0.61 - 1.24 mg/dL 0.62 0.65 0.70  Sodium 135 - 145 mmol/L 145 148(H) 151(H)  Potassium 3.5 - 5.1 mmol/L 3.4(L) 3.4(L) 3.3(L)  Chloride 98 - 111 mmol/L 113(H) 117(H) 119(H)  CO2 22 - 32 mmol/L _0 Calcium 8.9 - 10.3 mg/dL 9.0 9.0 9.1  Total Protein 6.5 - 8.1 g/dL - - -  Total Bilirubin 0.3 - 1.2 mg/dL - - -  Alkaline Phos 38 - 126 U/L - - -  AST 15 - 41 U/L - - -  ALT 0 - 44 U/L - - -   Micro 12/07/2020 blood culture has methicillin-resistant staph aureus, Enterobacter cloacae and Enterococcus faecalis  12/13/2020 blood culture: No growth so far  12/08/2020 bronchoalveolar lavage more than 100,000 colonies of Pseudomonas aeruginosa Methicillin-resistant staph aureus 40,000 colonies Enterococcus faecalis 20,000.  12/07/2020 urine culture Enterobacter cloacae   Sacral wound has rare Pseudomonas aeruginosa rare methicillin-resistant staph aureus few Bacteroides and rare Candida albicans.  Likely stool  contamination  Radiology  Impression/recommendation Polymicrobial bacteremia with sepsis.  Has MRSA, E faecalis, Enterobacter cloacae in blood culture.  Source could be lungs, wound, urine..   Patient is colonized with multiple organisms.  Acute on chronic hypoxic respiratory failure secondary to mucous plugging.  Bilateral pulmonary infiltrates.   pneumonia with Pseudomonas, MRSA, Enterococcus in the BAL.  Currently on vancomycin, cefepime, ciprofloxacin.  He is having dual coverage for Pseudomonas because susceptibility is different from 2 different strains.  Sacral wound is looking good but colonized with multiple organism.  Some of them are stool organisms.  May benefit from a diverting colostomy to help with wound healing. 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  Status post decompressive surgery in August 2021 at [facility].  PEG in place  ID will follow him peripherally this weekend.  Call if needed.

## 2020-12-15 NOTE — TOC Progression Note (Signed)
Transition of Care [facility]) - Progression Note    Patient Details  Name: Takuya Lariccia MRN: [id number] Date of Birth: 09-Aug-1951  Transition of Care [facility]) CM/SW [place], [place] Phone Number:  [phone] 12/15/2020, 4:33 PM  Clinical Narrative:     Patient is still not medically ready for d/c.  Plan is for d/c back home but LTACH is a possibility.  TOC will contniue to follow.  Expected Discharge Plan: [place] Barriers to Discharge: Continued Medical Work up  Expected Discharge Plan and Services Expected Discharge Plan: [place] In-house Referral: Clinical Social Work   Post Acute Care Choice: Durable Medical Equipment Living arrangements for the past 2 months: Single Family Home                                       Social Determinants of Health (SDOH) Interventions    Readmission Risk Interventions No flowsheet data found.

## 2020-12-15 NOTE — Progress Notes (Signed)
NAME:  Ryan Day, MRN:  [id number], DOB:  Jan 11, 1951, LOS: 8 ADMISSION DATE:  12/07/2020, CONSULTATION DATE: 12/07/2020 REFERRING MD: Dr. Tamala Day, CHIEF COMPLAINT: Hematuria   History of Present Illness:  This is a 70 Day quadriplegic male with a chronic tracheostomy who presented to [facility] ER on 03/17 with hematuria.  According to Ryan family last week pt noted to have sediment in his urine at home concerning for UTI.  He had a foley catheter placed 2 weeks ago and completed a 7 day course of abx therapy due to UTI.  Due to concerns of recurrent UTI Ryan family contacted Ryan PCP office last week, and heard from his PCP this week and were awaiting urine culture results.  However, pt noted to have hematuria yesterday and today along with sinus tachycardia hr 120's.  Ryan Day is a Ryan Day and due to concern of sepsis pt transported to the ER via EMS for further evaluation and treatment.    ED course Upon arrival to the ER Ryan O2 sats were 75-80% on RA, therefore pt placed on NRB with O2 sats initially increasing to 100%.  Pt transitioned to trach collar with initial O2 sats in the low 90's.  However, pt noted to have mucous plugging resulting in hypoxia requiring exchange of tracheostomy from a cuffless trach to a size 6 mm cuffed trach and pt placed on mechanical ventilation.  ER vital signs were: temp 101.7 rectally, sbp 60-80's, and hr 130's. Lab results revealed Na+ 153, chloride 119, glucose 147, lactic acid 2.8, wbc 2.0, hbg 11.1, and UA positive for UTI.  CXR negative and COVID-19/Influenza PCR results pending.  Sepsis protocol initiated and pt received cefepime, vancomycin, and 2L LR bolus.  Pt remained severely hypotensive requiring levophed gtt and right femoral central line placement.  PCCM team contacted for ICU admission.    12/08/20- patient is improved some he is communicative.  Wife Ryan Day at bedside we reviewed care plan.   12/10/20- patient is improved he is down to 31mcg on levophed and 30%Fio2  on ventilator, mentating well. Reviewed careplan with wife Ryan Day today.  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 C5 Cervical Fracture (following a mechanical fall at home) s/p C3-6 Laminectomy and Posterior Instrumentation and Fusion 08/4  Quadriplegia  Spinal Cord Compression  Chronic Tracheostomy and PEG    Significant Hospital Events: Including procedures, antibiotic start and stop dates in addition to other pertinent events   03/17: Pt admitted to ICU with urosepsis requiring vasopressors and acute on chronic hypoxic respiratory failure secondary to mucous plugging requiring exchange of cuffless trach to size 6 mm cuffed trach to be placed on mechanical ventilation  03/17: Right femoral central line placed by ER physician  03/17: CT Abd Pelvis findings with cystitis. Correlation with urinalysis is Recommended. Mild to moderate severity bibasilar atelectasis and/or Infiltrate. Sigmoid diverticulosis. Sacral decubitus ulcer, as described above. MRI correlation is recommended, as sequelae associated with acute osteomyelitis cannot be excluded. Aortic atherosclerosis. 03/17: CT Head revealed mild chronic ischemic white matter disease. No acute intracranial abnormality seen 03/17: Vancomycin x1 dose and Cefepime x1 dose 03/17: Zosyn>>  Interim History / Subjective:  Pt lethargic no signs of respiratory distress mechanically intubated via tracheostomy   Objective   Blood pressure 133/70, pulse 75, temperature 97.8 F (36.6 C), temperature source Axillary, resp. rate (!) 25, height 6' 5.01" (1.956 m), weight 124.9 kg,  SpO2 92 %.    Vent Mode: PRVC FiO2 (%):  [30 %-35 %] 30 % Set Rate:  [24 bmp] 24 bmp Vt Set:  [500 mL] 500 mL PEEP:  [8 cmH20] 8 cmH20 Plateau Pressure:  [23 cmH20] 23 cmH20   Intake/Output Summary (Last 24 hours) at 12/15/2020 1530 Last data  filed at 12/15/2020 1136 Gross per 24 hour  Intake 697.31 ml  Output 3950 ml  Net -3252.69 ml   Filed Weights   12/13/20 0500 12/14/20 0500 12/15/20 0500  Weight: 124.9 kg 124.9 kg 124.9 kg   Examination: General: chronically appearing male, NAD receiving mechanical ventilation via trach  HENT: supple, no JVD  Lungs: diminished throughout, even, non labored  Cardiovascular: sinus tach, no R/G, 2+ radial/1+ distal pulses, 2+ bilateral lower extremity edema  Abdomen: hypoactive BS x, soft, non distended  Extremities: quadriplegic Neuro: communicates to verbal language Skin: sacral spine decubitus  GU: chronic foley in place   Labs/imaging personally reviewed   CT Abd/Pelvis CXR CT Head  UA  Resolved Hospital Problem list   N/A  Assessment & Plan:   RESPIRATORY:  3/22                                                                         3/19   Acute on chronic hypoxic respiratory failure secondary to mucous plugging which is chronic   Mechanical ventilation via chronic tracheostomy        S/p bronch - many GPCs , cleared bilateral airways   Full vent support for now-vent settings reviewed and established  SBT once all parameters met  VAP bundle implemented   Aggressive pulmonary hygiene   Mucus plugging last night with distress, alleviated by PPB-ambu and lavage  CXR worsening infiltrates noted above on 3/22  Still with thick secretions, may need bronch in another day or two depending on CXR  Appreciate ID  CARDIOVASCULAR:  ECHO  1.EF 55 to 60%. LVH G2DD  2. RV size and function normal   3. The mitral valve is normal in structure. No MR   4. The aortic valve is grossly normal. No AR   Septic shock  Continuous telemetry monitoring   Aggressive fluid resuscitation and levophed/vasopressin gtts to maintain map >65  RENAL: Lab Results  Component Value Date   CREATININE 0.62 12/15/2020   BUN 35 (H) 12/15/2020   NA 145 12/15/2020   K 3.4 (L)  12/15/2020   CL 113 (H) 12/15/2020   CO2 28 12/15/2020    Intake/Output Summary (Last 24 hours) at 12/15/2020 1530 Last data filed at 12/15/2020 1136 Gross per 24 hour  Intake 697.31 ml  Output 3950 ml  Net -3252.69 ml   Net IO Since Admission: -11,856.28 mL [12/15/20 1530]   Mild acute renal failure secondary to ATN  Anion Gap Metabolic Acidosis, Severe lactic acidosis   Trend BMP and lactic acid, gap resolved, lactic 2.3  Replace electrolytes as indicated   Monitor UOP, despite all the free water and D5W Na remains elevated  Trial vasopressin and monitor Uop, finally Na <150  Will now plan to obtain DDAVP nasal spray and replace the IV dosing  Hopefully will decrease water sources   Avoid nephrotoxic medications  ID: Lab Results  Component Value Date   WBC 14.6 (H) 12/15/2020    Urosepsis secondary to chronic foley   Leukopenia  Septic Shock, resolved   Slow up-trend WBC  PCT skyrocketed >150 from 3/18, down significantly  Resistance pattern of the Pseudomonas vs Enterobacter, switch to Cefepipme  Appreciate ID consultant   Foley catheter exchanged in the ED  Sacral spine pressure ulcer-pt previously had wound vac in place with removal   Wound Care consulted appreciate input   Fungitell < 31  HEME: Lab Results  Component Value Date   HCT 22.6 (L) 12/15/2020    Anemia without obvious acute blood loss  Trend CBC   Monitor for s/sx of bleeding  Transfused today for hgb <7  NEURO:  Quadriplegia-bed bound  Acute toxic encephalopathy   Frequent reorientation   Will attempt to avoid sedating medications   Best practice (evaluated daily)  Diet:  NPO Pain/Anxiety/Delirium protocol (if indicated): Yes (RASS goal 0) VAP protocol (if indicated): Yes DVT prophylaxis: Systemic AC GI prophylaxis: H2B Glucose control:  SSI No Central venous access:  Yes, and it is still needed Arterial line:  N/A Foley:  Yes, and it is still  needed Mobility:  bed rest  PT consulted: N/A Last date of multidisciplinary goals of care discussion [N/A] Code Status:  full code Disposition: ICU   Labs   CBC: Recent Labs  Lab 12/11/20 0547 12/12/20 0542 12/13/20 0550 12/14/20 0302 12/15/20 0343  WBC 11.2* 9.8 12.8* 13.1* 14.6*  NEUTROABS  --   --  9.5* 10.2* 11.6*  HGB 7.5* 7.3* 7.6* 7.0* 6.9*  HCT 23.6* 23.9* 24.1* 22.6* 22.6*  MCV 93.7 95.2 93.1 94.6 94.6  PLT 110* 98* 139* 134* 573    Basic Metabolic Panel: Recent Labs  Lab 12/11/20 0547 12/11/20 1942 12/12/20 0542 12/13/20 0550 12/14/20 0302 12/15/20 0343  NA 154*  --  155* 151* 148* 145  K 3.4*  --  3.6 3.3* 3.4* 3.4*  CL 126*  --  122* 119* 117* 113*  CO2 25  --  $R'28 27 27 28  'wA$ GLUCOSE 170*  --  167* 170* 187* 160*  BUN 44*  --  40* 40* 36* 35*  CREATININE 1.09  --  0.89 0.70 0.65 0.62  CALCIUM 9.1  --  9.1 9.1 9.0 9.0  MG 2.5*  --  2.3 2.2 2.0 1.9  PHOS 1.3* 3.1 2.5 2.6 2.2* 3.2   GFR: Estimated Creatinine Clearance: 129.3 mL/min (by C-G formula based on SCr of 0.62 mg/dL). Recent Labs  Lab 12/08/20 1927 12/08/20 2337 12/09/20 0454 12/10/20 0513 12/12/20 0542 12/13/20 0550 12/14/20 0302 12/15/20 0343  PROCALCITON  --   --  >150.00  --   --  32.49  --   --   WBC  --   --  15.2*   < > 9.8 12.8* 13.1* 14.6*  LATICACIDVEN 5.1* 3.3* 2.3*  --   --   --   --   --    < > = values in this interval not displayed.    Liver Function Tests: No results for input(s): AST, ALT, ALKPHOS, BILITOT, PROT, ALBUMIN in the last 168 hours. No results for input(s): LIPASE, AMYLASE in the last 168 hours. No results for input(s): AMMONIA in the last 168 hours.  ABG    Component Value Date/Time   PHART 7.38 12/08/2020 0500   PCO2ART 42 12/08/2020 0500   PO2ART 115 (H) 12/08/2020 0500   HCO3 24.8 12/08/2020 0500  ACIDBASEDEF 0.4 12/08/2020 0500   O2SAT 98.4 12/08/2020 0500     Coagulation Profile: No results for input(s): INR, PROTIME in the last 168  hours.  Cardiac Enzymes: No results for input(s): CKTOTAL, CKMB, CKMBINDEX, TROPONINI in the last 168 hours.  HbA1C: Hgb A1c MFr Bld  Date/Time Value Ref Range Status  06/09/2020 07:00 AM 5.8 (H) 4.8 - 5.6 % Final    Comment:    (NOTE) Pre diabetes:          5.7%-6.4%  Diabetes:              >6.4%  Glycemic control for   <7.0% adults with diabetes     CBG: Recent Labs  Lab 12/14/20 1913 12/14/20 2349 12/15/20 0406 12/15/20 0717 12/15/20 1123  GLUCAP 128* 134* 151* 175* 153*    Review of Systems:   Unable to assess pt chronically trached currently requiring mechanical ventilation   Past Medical History:  He,  has a past medical history of Acute on chronic respiratory failure with hypoxia ([place]), Acute renal injury due to hypovolemia [facility]), Autonomic instability, Cardiac arrest ([place]), Cervical spinal cord injury, sequela ([place]), Elevated alkaline phosphatase level, 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   Surgical History:   Past Surgical History:  Procedure Laterality Date  . APPLICATION OF WOUND VAC N/A 08/16/2020   Procedure: APPLICATION OF WOUND VAC;  Surgeon: Fredirick Maudlin, MD;  Location: [facility] ORS;  Service: General;  Laterality: N/A;  . COLONOSCOPY WITH PROPOFOL N/A 12/05/2017   Procedure: COLONOSCOPY WITH PROPOFOL;  Surgeon: Lin Landsman, MD;  Location: [facility] ENDOSCOPY;  Service: Gastroenterology;  Laterality: N/A;  . ESOPHAGOGASTRODUODENOSCOPY  12/05/2017   Procedure: ESOPHAGOGASTRODUODENOSCOPY (EGD);  Surgeon: Lin Landsman, MD;  Location: [place];  Service: Gastroenterology;;  . INCISION AND DRAINAGE ABSCESS N/A 08/16/2020   Procedure: INCISION AND DRAINAGE ABSCESS, SACRAL;  Surgeon: Fredirick Maudlin, MD;  Location: [facility] ORS;  Service: General;  Laterality: N/A;     Social History:   reports that he has never smoked. He has never used smokeless tobacco. He reports previous alcohol use. He reports  that he does not use drugs.   Family History:  His family history includes Alcohol abuse in his brother; Cancer in his brother and father; Heart disease (age of onset: 82) in his brother; Liver cancer in his brother.   Allergies Allergies  Allergen Reactions  . Shrimp (Diagnostic)     Experiences facial droop when eating seafood     Home Medications  Prior to Admission medications   Medication Sig Start Date End Date Taking? Authorizing Provider  acetaminophen (TYLENOL) 160 MG/5ML solution Place 10-20 mLs (320-640 mg total) into feeding tube every 4 (four) hours as needed for mild pain. 07/18/20   Love, Ivan Anchors, PA-C  ascorbic acid (VITAMIN C) 500 MG tablet Place 1 tablet (500 mg total) into feeding tube 2 (two) times daily. 07/18/20   Love, Ivan Anchors, PA-C  baclofen (LIORESAL) 10 MG tablet Take 1 tablet (10 mg total) by mouth 3 (three) times daily. 11/12/20   Johnson, Megan P, DO  bisacodyl (DULCOLAX) 10 MG suppository Place 1 suppository (10 mg total) rectally at bedtime. 07/20/20   Love, Ivan Anchors, PA-C  chlorhexidine (PERIDEX) 0.12 % solution 15 mLs by Mouth Rinse route 2 (two) times daily. 07/20/20   Love, Ivan Anchors, PA-C  collagenase (SANTYL) ointment Apply topically daily. Patient not taking: Reported on 11/07/2020 07/20/20   Bary Leriche, PA-C  diclofenac Sodium (  VOLTAREN) 1 % GEL Apply 4 g topically 4 (four) times daily. 08/31/20   Kathrine Haddock, NP  famotidine (PEPCID) 20 MG tablet Place 1 tablet (20 mg total) into feeding tube daily. 07/20/20   Love, Ivan Anchors, PA-C  ferrous sulfate 300 (60 Fe) MG/5ML syrup Place 1.3 mLs (78 mg total) into feeding tube daily. 07/20/20   Love, Ivan Anchors, PA-C  fludrocortisone (FLORINEF) 0.1 MG tablet Take 200 mcg by mouth daily. 11/02/20   [provider]  FLUoxetine (PROZAC) 20 MG/5ML solution Place 10 mLs (40 mg total) into feeding tube daily. 11/22/20   Vigg, Avanti, MD  guaiFENesin 200 MG tablet Place 2 tablets (400 mg total) into feeding  tube every 6 (six) hours. 07/20/20   Love, Ivan Anchors, PA-C  lidocaine (LIDODERM) 5 % Place 1 patch onto the skin daily. Remove & Discard patch within 12 hours or as directed by MD 07/20/20   Love, Ivan Anchors, PA-C  midodrine (PROAMATINE) 10 MG tablet Place 1 tablet (10 mg total) into feeding tube 3 (three) times daily with meals. 11/04/20   [place] Liter P, DO  Mouthwashes (MOUTH RINSE) LIQD solution 15 mLs by Mouth Rinse route 2 times daily at 12 noon and 4 pm. 07/20/20   Love, Ivan Anchors, PA-C  Multiple Vitamin (MULTIVITAMIN) LIQD Place 5 mLs into feeding tube daily. 07/20/20   Love, Ivan Anchors, PA-C  nutrition supplement, JUVEN, (JUVEN) PACK Place 1 packet into feeding tube 2 (two) times daily between meals. 07/20/20   Love, Ivan Anchors, PA-C  Nutritional Supplements (FEEDING SUPPLEMENT, OSMOLITE 1.5 CAL,) LIQD Place 474 mLs into feeding tube 4 (four) times daily. 07/20/20   Love, Ivan Anchors, PA-C  Nutritional Supplements (FEEDING SUPPLEMENT, PROSOURCE TF,) liquid Place 45 mLs into feeding tube 2 (two) times daily. 07/20/20   Love, Ivan Anchors, PA-C  ondansetron (ZOFRAN) 4 MG tablet Place 1 tablet (4 mg total) into feeding tube every 8 (eight) hours as needed for nausea, vomiting or refractory nausea / vomiting. 07/20/20   Love, Ivan Anchors, PA-C  polycarbophil (FIBERCON) 625 MG tablet Place 1 tablet (625 mg total) into feeding tube daily. 07/20/20   Love, Ivan Anchors, PA-C  rivaroxaban (XARELTO) 10 MG TABS tablet Take one tablet per tube daily 08/08/20   [name], MD  saccharomyces boulardii (FLORASTOR) 250 MG capsule Place 1 capsule (250 mg total) into feeding tube 2 (two) times daily. 07/20/20   Love, Ivan Anchors, PA-C  sennosides (SENOKOT) 8.8 MG/5ML syrup Place 5 mLs into feeding tube daily at 6 (six) AM. 07/20/20   Love, Ivan Anchors, PA-C  simethicone (MYLICON) 40 TD/3.2KG drops Place 1.2 mLs (80 mg total) into feeding tube 4 (four) times daily. 07/20/20   Love, Ivan Anchors, PA-C  traZODone (DESYREL) 50 MG tablet  Place 1 tablet (50 mg total) into feeding tube at bedtime. 10/23/20   Jon Billings, NP  Water For Irrigation, Sterile (FREE WATER) SOLN Place 400 mLs into feeding tube every 4 (four) hours. Patient taking differently: Place 300 mLs into feeding tube every 4 (four) hours. 07/20/20   Love, Ivan Anchors, PA-C  zinc sulfate 220 (50 Zn) MG capsule Place 1 capsule (220 mg total) into feeding tube daily. 07/20/20   Bary Leriche, PA-C         Critical care provider statement:    Critical care time (minutes):  40   Critical care time was exclusive of:  Separately billable procedures and  treating other patients   Critical care was  necessary to treat or prevent imminent or  life-threatening deterioration of the following conditions:  mucu plugging, acute respiratory distress with hypoxemia, possible sepsis   Critical care was time spent personally by me on the following  activities:  Development of treatment plan with patient or surrogate,  discussions with consultants, evaluation of patient's response to  treatment, examination of patient, obtaining history from patient or  surrogate, ordering and performing treatments and interventions, ordering  and review of laboratory studies and re-evaluation of patient's condition   I assumed direction of critical care for this patient from another  provider in my specialty: no         

## 2020-12-15 NOTE — Progress Notes (Addendum)
Pharmacy Antibiotic Note  Ryan Day is a 70 y.o. male admitted on 12/07/2020 with sepsis. Pt presented with complaints of recurrent UTI and hematuria with foley cath last placed ~2 weeks ago, exchanged in ED. 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 Pt has PEG and trache. Pt admitted 11/22-12/5 for HCAP and sacral ulcer with osteomyelitis. Hx of enterobacter cloacae, pseudomonas in ucx. Pt has received vanc, Unasyn, Zosyn, and cefepime on recent previous admissions. ID consulted. Pt with polymicrobial bacteremia with sepsis resulting from multiple sources including UTI, PNA, and sacral wound. Meropenem was discontinued 3/22 based on sensitivity to ceftazidime from BAL. IV Cipro was added due to increased secretions. Pharmacy has been consulted for Vancomycin dosing.   Scr improved: 1.87 > 1.66 > 1.09 > 0.89 > 0.7> 0.65 baseline(~0.5)  Day 10 abx, day 4 cipro  Plan: -- Continue Cipro IV 400 mg q8h  -- Continue cefepime 2 grams q8h  -- Continue Vancomycin 104m q12h  -- Vanc levels ordered with 3/25 dose  ----Vanc pk 3/25 @ 0101: 27 ----Vanc trough unable to draw 3/25 @ 0900 due to blood being transfused. Rescheduled trough for 2100. F/u level --Continue to monitor creatinine daily  Height: 6' 5.01" (195.6 cm) Weight: 124.9 kg (275 lb 5.7 oz) IBW/kg (Calculated) : 89.12  Temp (24hrs), Avg:97.7 F (36.5 C), Min:97.2 F (36.2 C), Max:98.2 F (36.8 C)  Recent Labs  Lab 12/08/20 0921 12/08/20 1151 12/08/20 1927 12/08/20 2337 12/09/20 0454 12/10/20 0513 12/11/20 0547 12/12/20 0542 12/12/20 0601 12/13/20 0550 12/14/20 0302 12/15/20 0101 12/15/20 0343  WBC  --   --   --   --  15.2*   < > 11.2* 9.8  --  12.8* 13.1*  --  14.6*  CREATININE  --   --   --   --  1.66*   < > 1.09 0.89  --  0.70 0.65  --  0.62  LATICACIDVEN 7.3* 7.4* 5.1* 3.3* 2.3*  --   --   --   --   --   --   --   --   VANCOPEAK  --   --   --   --   --   --   --   --   --   --   --   27*  --   VANCORANDOM  --   --   --   --  20   < > 14  --  12  --   --   --   --    < > = values in this interval not displayed.    Estimated Creatinine Clearance: 129.3 mL/min (by C-G formula based on SCr of 0.62 mg/dL).    Allergies  Allergen Reactions  . Shrimp (Diagnostic)     Experiences facial droop when eating seafood    Antimicrobials this admission: 3/17 Vancomycin  >>  3/17 Cefepime  >> 3/20; 3/22 >> 3/17 Zosyn x 1  3/20 Meropenem >> 3/21  3/22 Cipro >>  Dose adjustments this admission: Cefepime 2g q24h>q12h>q8h based on improving renal function  3/17 vanc 1g x 1 dose 3/18 vanc 2g x 1 dose 3/19 vanc random @ 0454: 20 - vanc 2g x 1 dose 3/20 vanc random @ 0652: 23 - no redose 3/21 vanc random @ 0547: 14 - vanc 1 g x 1 dose 3/22 vanc random @ 0601: 12 - vanc 1750 mg x 1 dose 3/23 vanc [id number] scheduled  Microbiology  results: 3/17 MRSA PCR: positive 3/17 BCx: e. Cloacae, MRSA, e. Faecalis 3/17 UCx: e. cloacae 3/18 resp cx: pseudomonas aeruginosa 3/18 BAL: Pseudomonas aeruginosa (ceftazidime sensitive), MRSA, e. faecalis 3/21 sacral cx: MRSA, pseudomonas aeruginosa 3/23 BCx: NGTD  Thank you for allowing pharmacy to be a part of this patient's care.  Benn Moulder, PharmD Pharmacy Resident  12/15/2020 7:18 AM

## 2020-12-15 NOTE — Progress Notes (Signed)
Nutrition Follow Up Note   DOCUMENTATION CODES:   Not applicable  INTERVENTION:   Continue Pivot 1.5 '@70ml' /hr + Pro-Source 39m daily via tube  Free water flushes 202mq2 hrs per MD  Regimen provides 2560kcal/day, 168g/day protein and 36756may free water   Juven Fruit Punch BID via tube, each serving provides 95kcal and 2.5g of protein (amino acids glutamine and arginine)  NUTRITION DIAGNOSIS:   Inadequate oral intake related to dysphagia as evidenced by NPO status (pt with chronic PEG).  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 PEG tube in place. Pt tolerating tube feeds at goal rate. Per chart, pt has remained weight stable since admit.   Enteral Access: 20 Fr. PEG-tube placed 05/04/20  Medications reviewed and include: vitamin C, dulcolax, ferrous sulfate, insulin, KCl, florastor, senokot, simethicone, zinc, cefepime, ciprofloxacin, pepcid, vancomycin   Labs reviewed: Na 145 wnl, K 3.4(L), BUN 35(H), P 3.2 wnl, Mg 1.9 wnl Wbc- 14.6(H), Hgb 6.9(L), Hct 22.6(L) cbgs- 151, 175, 153 x 24 hrs  Patient is currently intubated on ventilator support MV: 6.1 L/min Temp (24hrs), Avg:97.8 F (36.6 C), Min:97.2 F (36.2 C), Max:98.2 F (36.8 C)  Propofol: none   MAP- >66m58m UOP- 4150ml45miet Order:    Diet Order            Diet NPO time specified  Diet effective now                EDUCATION NEEDS:   No education needs have been identified at this time  Skin:  Skin Assessment: Reviewed RN Assessment (Right gluteal, extends to hip: 1 cm x 1 cm x 0.2 cm, Sacrum: 2.5 cm x 2 cm x 1.5 cm, Right heel:  2.7 cm x 2.4 cm x 0.1 cm, Left heel: 4.5 cm x 5.6 cm x 0.1  cm)  Last BM:  3/25- type 7  Height:   Ht Readings from Last 1 Encounters:  12/14/20 6' 5.01" (1.956 m)    Weight:   Wt Readings from Last 1 Encounters:  12/15/20 124.9 kg    Ideal Body Weight:  94.5 kg  BMI:  Body mass index is 32.65 kg/m.  Estimated Nutritional Needs:   Kcal:  2600-2800kcal/day  Protein:  140-165 grams  Fluid:  2.6-2.9L/day  CaseyKoleen DistanceRD, LDN Please refer to [facility]ION[facility] WestRD and/or RD on-call/weekend/after hours pager

## 2020-12-16 DIAGNOSIS — J189 Pneumonia, unspecified organism: Secondary | ICD-10-CM | POA: Diagnosis not present

## 2020-12-16 DIAGNOSIS — A4152 Sepsis due to Pseudomonas: Secondary | ICD-10-CM | POA: Diagnosis not present

## 2020-12-16 DIAGNOSIS — L8994 Pressure ulcer of unspecified site, stage 4: Secondary | ICD-10-CM | POA: Diagnosis not present

## 2020-12-16 DIAGNOSIS — J9621 Acute and chronic respiratory failure with hypoxia: Secondary | ICD-10-CM | POA: Diagnosis not present

## 2020-12-16 LAB — MAGNESIUM: Magnesium: 1.8 mg/dL (ref 1.7–2.4)

## 2020-12-16 LAB — TYPE AND SCREEN
ABO/RH(D): O POS
Antibody Screen: NEGATIVE
Unit division: 0

## 2020-12-16 LAB — CBC
HCT: 24.8 % — ABNORMAL LOW (ref 39.0–52.0)
Hemoglobin: 7.8 g/dL — ABNORMAL LOW (ref 13.0–17.0)
MCH: 28.8 pg (ref 26.0–34.0)
MCHC: 31.5 g/dL (ref 30.0–36.0)
MCV: 91.5 fL (ref 80.0–100.0)
Platelets: 176 10*3/uL (ref 150–400)
RBC: 2.71 MIL/uL — ABNORMAL LOW (ref 4.22–5.81)
RDW: 17.2 % — ABNORMAL HIGH (ref 11.5–15.5)
WBC: 12.5 10*3/uL — ABNORMAL HIGH (ref 4.0–10.5)
nRBC: 0.2 % (ref 0.0–0.2)

## 2020-12-16 LAB — BASIC METABOLIC PANEL
Anion gap: 5 (ref 5–15)
BUN: 32 mg/dL — ABNORMAL HIGH (ref 8–23)
CO2: 29 mmol/L (ref 22–32)
Calcium: 9 mg/dL (ref 8.9–10.3)
Chloride: 110 mmol/L (ref 98–111)
Creatinine, Ser: 0.53 mg/dL — ABNORMAL LOW (ref 0.61–1.24)
GFR, Estimated: >60 mL/min (ref >60)
Glucose, Bld: 192 mg/dL — ABNORMAL HIGH (ref 70–99)
Potassium: 3.4 mmol/L — ABNORMAL LOW (ref 3.5–5.1)
Sodium: 144 mmol/L (ref 135–145)

## 2020-12-16 LAB — GLUCOSE, CAPILLARY
Glucose-Capillary: 130 mg/dL — ABNORMAL HIGH (ref 70–99)
Glucose-Capillary: 141 mg/dL — ABNORMAL HIGH (ref 70–99)
Glucose-Capillary: 143 mg/dL — ABNORMAL HIGH (ref 70–99)
Glucose-Capillary: 152 mg/dL — ABNORMAL HIGH (ref 70–99)
Glucose-Capillary: 159 mg/dL — ABNORMAL HIGH (ref 70–99)
Glucose-Capillary: 165 mg/dL — ABNORMAL HIGH (ref 70–99)
Glucose-Capillary: 173 mg/dL — ABNORMAL HIGH (ref 70–99)

## 2020-12-16 LAB — BPAM RBC
Blood Product Expiration Date: 202204262359
ISSUE DATE / TIME: 202203250837
Unit Type and Rh: 5100

## 2020-12-16 LAB — VANCOMYCIN, PEAK: Vancomycin Pk: 30 ug/mL (ref 30–40)

## 2020-12-16 LAB — PHOSPHORUS: Phosphorus: 2.3 mg/dL — ABNORMAL LOW (ref 2.5–4.6)

## 2020-12-16 LAB — VANCOMYCIN, TROUGH: Vancomycin Tr: 22 ug/mL (ref 15–20)

## 2020-12-16 MED ORDER — POTASSIUM CHLORIDE 20 MEQ PO PACK
20.0000 meq | PACK | Freq: Once | ORAL | Status: AC
  Administered 2020-12-16: 20 meq
  Filled 2020-12-16: qty 1

## 2020-12-16 MED ORDER — BISACODYL 10 MG RE SUPP: 10.0000 mg | Freq: Every day | RECTAL | Status: DC | PRN

## 2020-12-16 MED ORDER — MAGNESIUM SULFATE 2 GM/50ML IV SOLN
2.0000 g | Freq: Once | INTRAVENOUS | Status: AC
  Administered 2020-12-16: 2 g via INTRAVENOUS
  Filled 2020-12-16: qty 50

## 2020-12-16 MED ORDER — POTASSIUM PHOSPHATES 15 MMOLE/5ML IV SOLN
15.0000 mmol | Freq: Once | INTRAVENOUS | Status: AC
  Administered 2020-12-16: 15 mmol via INTRAVENOUS
  Filled 2020-12-16: qty 5

## 2020-12-16 NOTE — Progress Notes (Signed)
Pharmacy Antibiotic Note  Ryan Day is a 69 y.o. male admitted on 12/07/2020 with sepsis. Pt presented with complaints of recurrent UTI and hematuria with foley cath last placed ~2 weeks ago, exchanged in ED. 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 Pt has PEG and trache. Pt admitted 11/22-12/5 for HCAP and sacral ulcer with osteomyelitis. Hx of enterobacter cloacae, pseudomonas in ucx. Pt has received vanc, Unasyn, Zosyn, and cefepime on recent previous admissions. ID consulted. Pt with polymicrobial bacteremia with sepsis resulting from multiple sources including UTI, PNA, and sacral wound. Meropenem was discontinued 3/22 based on sensitivity to ceftazidime from BAL. IV Cipro was added due to increased secretions. Pharmacy has been consulted for Vancomycin dosing.   Scr improved: 0.53   baseline(~0.5)  Day 11 abx, day 5 cipro  Plan: -- Continue Cipro IV 400 mg q8h  -- Continue Cefepime 2 grams q8h  -- Continue Vancomycin 1026m q12h   Vanc levels ordered with 3/25 dose  ----Vanc pk 3/25 @ 0101: 27 ----Vanc trough unable to draw 3/25 @ 0900 due to blood being transfused. Rescheduled trough for 2100. F/u level --Continue to monitor creatinine daily   3/26  Vancomycin trough @ 0949= 22 mcg/ml. Per MAR dose was due at 2200 and actually hung at [place]  Will re-time dose to 1200 and recheck a peak and a trough after this dose.    Height: 6' 5.01" (195.6 cm) Weight: 127 kg (279 lb 15.8 oz) IBW/kg (Calculated) : 89.12  Temp (24hrs), Avg:97.6 F (36.4 C), Min:96 F (35.6 C), Max:98.3 F (36.8 C)  Recent Labs  Lab 12/11/20 0547 12/12/20 0542 12/12/20 0601 12/13/20 0550 12/14/20 0302 12/15/20 0101 12/15/20 0343 12/16/20 0450 12/16/20 0949  WBC 11.2* 9.8  --  12.8* 13.1*  --  14.6* 12.5*  --   CREATININE 1.09 0.89  --  0.70 0.65  --  0.62 0.53*  --   VANCOTROUGH  --   --   --   --   --   --   --   --  22*  VANCOPEAK  --   --   --   --   --  27*   --   --   --   VANCORANDOM 14  --  12  --   --   --   --   --   --     Estimated Creatinine Clearance: 130.4 mL/min (A) (by C-G formula based on SCr of 0.53 mg/dL (L)).    Allergies  Allergen Reactions  . Shrimp (Diagnostic)     Experiences facial droop when eating seafood    Antimicrobials this admission: 3/17 Vancomycin  >>  3/17 Cefepime  >> 3/20; 3/22 >> 3/17 Zosyn x 1  3/20 Meropenem >> 3/21  3/22 Cipro >>  Dose adjustments this admission: Cefepime 2g q24h>q12h>q8h based on improving renal function  3/17 vanc 1g x 1 dose 3/18 vanc 2g x 1 dose 3/19 vanc random @ 0454: 20 - vanc 2g x 1 dose 3/20 vanc random @ 0652: 23 - no redose 3/21 vanc random @ 0547: 14 - vanc 1 g x 1 dose 3/22 vanc random @ 0601: 12 - vanc 1750 mg x 1 dose 3/23 vanc [id number] scheduled 3/25 Vanc pk 3/25 @ 0101: 27   Unable to get the trough prior to next dose d/t pt getting blood 3/26 Vanc trough= 22  But dose hung 2.5 hours late?  F/u levels after next dose  Microbiology results: 3/17 MRSA PCR: positive 3/17 BCx: e. Cloacae, MRSA, e. Faecalis 3/17 UCx: e. cloacae 3/18 resp cx: pseudomonas aeruginosa 3/18 BAL: Pseudomonas aeruginosa (ceftazidime sensitive), MRSA, e. faecalis 3/21 sacral cx: MRSA, pseudomonas aeruginosa 3/23 BCx: NGTD  Thank you for allowing pharmacy to be a part of this patient's care.  Chinita Greenland PharmD Clinical Pharmacist 12/16/2020

## 2020-12-16 NOTE — Consult Note (Signed)
[place] for Electrolyte Monitoring and Replacement   Recent Labs: Potassium (mmol/L)  Date Value  12/16/2020 3.4 (L)   Magnesium (mg/dL)  Date Value  12/16/2020 1.8   Calcium (mg/dL)  Date Value  12/16/2020 9.0   Albumin (g/dL)  Date Value  12/07/2020 3.1 (L)  02/01/2020 4.4   Phosphorus (mg/dL)  Date Value  12/16/2020 2.3 (L)   Sodium (mmol/L)  Date Value  12/16/2020 144  02/01/2020 143   Corrected Ca: 9.7  Assessment: Patient is a 70 y/o M presented with sepsis. Pt presented with complaints of recurrent UTI and hematuria. 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 Pt has chronic tracheostomy and PEG tube. PMH includes HTN and HLD. Pharmacy has been consulted to assist with electrolyte monitoring and replacement as indicated. Scr trending down 1.87 > 1.66>1.09>0.89 > 0.7 > 0.65.   Sodium improving 144  Nutrition: tube feeds @ 70 mL/hr, prosource tube feeds once daily   MIVF: D5 @ 50 mL/hr  3/22: Desmopressin 52mcg nasal BID  Goal of Therapy:  Electrolytes within normal limits  Plan:  K 3.4  Mag 1.8  Phos 2.3 Na 144  Scr 0.53 -- Na 144 - continue free water 200 mL per tube q2h, desmopressin BID, D5 MIVF   -- K 3.4 - NP has ordered KCl 20 mEq per tube x 1 dose --Phos 2.3 - NP has ordered KPhos 15 mmol IV x 1 --Mag 1.8 -  NP has ordered Mag 2 gm IV x1 -- Follow-up electrolytes with AM labs  Chinita Greenland PharmD Clinical Pharmacist 12/16/2020

## 2020-12-16 NOTE — Plan of Care (Signed)
  Problem: Nutrition: Goal: Adequate nutrition will be maintained Outcome: Progressing   Problem: Coping: Goal: Level of anxiety will decrease Outcome: Progressing   Problem: Elimination: Goal: Will not experience complications related to bowel motility Outcome: Progressing Goal: Will not experience complications related to urinary retention Outcome: Progressing   Problem: Pain Managment: Goal: General experience of comfort will improve Outcome: Progressing   

## 2020-12-16 NOTE — Progress Notes (Signed)
NAME:  Ryan Day, MRN:  [id number], DOB:  1951/04/06, LOS: 9 ADMISSION DATE:  12/07/2020, CONSULTATION DATE: 12/07/2020 REFERRING MD: Dr. 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  According to pts family last week pt noted to have sediment in his urine at home concerning for UTI.  He had a foley catheter placed 2 weeks ago and completed a 7 day course of abx therapy due to UTI.  Due to concerns of recurrent UTI pts family contacted pts PCP office last week, and heard from his PCP this week and were awaiting urine culture results.  However, pt noted to have hematuria yesterday and today along with sinus tachycardia hr 120's.  Pts daughter is a Marine scientist and due to concern of sepsis pt transported to the ER via EMS for further evaluation and treatment.    ED course Upon arrival to the ER pts O2 sats were 75-80% on RA, therefore pt placed on NRB with O2 sats initially increasing to 100%.  Pt transitioned to trach collar with initial O2 sats in the low 90's.  However, pt noted to have mucous plugging resulting in hypoxia requiring exchange of tracheostomy from a cuffless trach to a size 6 mm cuffed trach and pt placed on mechanical ventilation.  ER vital signs were: temp 101.7 rectally, sbp 60-80's, and hr 130's. Lab results revealed Na+ 153, chloride 119, glucose 147, lactic acid 2.8, wbc 2.0, hbg 11.1, and UA positive for UTI.  CXR negative and COVID-19/Influenza PCR results pending.  Sepsis protocol initiated and pt received cefepime, vancomycin, and 2L LR bolus.  Pt remained severely hypotensive requiring levophed gtt and right femoral central line placement.  PCCM team contacted for ICU admission.    12/08/20- patient is improved some he is communicative.  Wife [name] at bedside we reviewed care plan.   12/10/20- patient is improved he is down to 69mg on levophed and 30%Fio2  on ventilator, mentating well. Reviewed careplan with wife LVaughan Bastatoday.  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 C5 Cervical Fracture (following a mechanical fall at home) s/p C3-6 Laminectomy and Posterior Instrumentation and Fusion 08/4  Quadriplegia  Spinal Cord Compression  Chronic Tracheostomy and PEG    Significant Hospital Events: Including procedures, antibiotic start and stop dates in addition to other pertinent events   03/17: Pt admitted to ICU with urosepsis requiring vasopressors and acute on chronic hypoxic respiratory failure secondary to mucous plugging requiring exchange of cuffless trach to size 6 mm cuffed trach to be placed on mechanical ventilation  03/17: Right femoral central line placed by ER physician  03/17: CT Abd Pelvis findings with cystitis. Correlation with urinalysis is Recommended. Mild to moderate severity bibasilar atelectasis and/or Infiltrate. Sigmoid diverticulosis. Sacral decubitus ulcer, as described above. MRI correlation is recommended, as sequelae associated with acute osteomyelitis cannot be excluded. Aortic atherosclerosis. 03/17: CT Head revealed mild chronic ischemic white matter disease. No acute intracranial abnormality seen 03/17: Vancomycin x1 dose and Cefepime x1 dose 03/17: Zosyn>>  Interim History / Subjective:  Pt lethargic no signs of respiratory distress mechanically intubated via tracheostomy   Objective   Blood pressure (!) 134/96, pulse 66, temperature (!) 97 F (36.1 C), resp. rate (!) 25, height 6' 5.01" (1.956 m), weight 127 kg, SpO2  100 %.    Vent Mode: PRVC FiO2 (%):  [30 %] 30 % Set Rate:  [24 bmp] 24 bmp Vt Set:  [500 mL] 500 mL PEEP:  [8 cmH20] 8 cmH20 Plateau Pressure:  [22 cmH20-23 cmH20] 22 cmH20   Intake/Output Summary (Last 24 hours) at 12/16/2020 1611 Last data filed at 12/16/2020  1400 Gross per 24 hour  Intake 4670.45 ml  Output 5700 ml  Net -1029.55 ml   Filed Weights   12/14/20 0500 12/15/20 0500 12/16/20 0448  Weight: 124.9 kg 124.9 kg 127 kg   Examination: General: chronically appearing male, NAD receiving mechanical ventilation via trach  HENT: supple, no JVD  Lungs: diminished throughout, even, non labored  Cardiovascular: sinus tach, no R/G, 2+ radial/1+ distal pulses, 2+ bilateral lower extremity edema  Abdomen: hypoactive BS x, soft, non distended  Extremities: quadriplegic Neuro: communicates to verbal language Skin: sacral spine decubitus  GU: chronic foley in place   Labs/imaging personally reviewed   CT Abd/Pelvis CXR CT Head  UA  Resolved Hospital Problem list   N/A  Assessment & Plan:   RESPIRATORY:  3/22                                                                         3/19   Acute on chronic hypoxic respiratory failure secondary to mucous plugging which is chronic   Mechanical ventilation via chronic tracheostomy        S/p bronch - many GPCs , cleared bilateral airways   Full vent support for now-vent settings reviewed and established  SBT once all parameters met  VAP bundle implemented   Aggressive pulmonary hygiene   Mucus plugging last night with distress, alleviated by PPB-ambu and lavage  CXR worsening infiltrates noted above on 3/22  Still with thick secretions, may need bronch in another day or two depending on CXR  Appreciate ID  CARDIOVASCULAR:  ECHO  1.EF 55 to 60%. LVH G2DD  2. RV size and function normal   3. The mitral valve is normal in structure. No MR   4. The aortic valve is grossly normal. No AR   Septic shock, resolved  Continuous telemetry monitoring   Aggressive fluid resuscitation and levophed/vasopressin gtts to maintain map >65  RENAL: Lab Results  Component Value Date   CREATININE 0.53 (L) 12/16/2020   BUN 32 (H) 12/16/2020   NA 144 12/16/2020   K 3.4 (L) 12/16/2020    CL 110 12/16/2020   CO2 29 12/16/2020    Intake/Output Summary (Last 24 hours) at 12/16/2020 1611 Last data filed at 12/16/2020 1400 Gross per 24 hour  Intake 4670.45 ml  Output 5700 ml  Net -1029.55 ml   Net IO Since Admission: -12,885.83 mL [12/16/20 1611]   Mild acute renal failure secondary to ATN, resolved  Anion Gap Metabolic Acidosis, Severe lactic acidosis   Trend BMP and lactic acid, gap resolved, lactic 2.3  Replace electrolytes as indicated   Monitor UOP, despite all the free water and D5W Na remains elevated  Trial vasopressin and monitor Uop, finally Na <150  Will now plan to obtain DDAVP nasal spray and replace the IV dosing  Hopefully will decrease water sources   Avoid nephrotoxic medications  ID: Lab Results  Component Value Date   WBC 12.5 (H) 12/16/2020    Urosepsis secondary to chronic foley   Leukopenia  Septic Shock, resolved   Slow up-trend WBC but regressed overnight  PCT skyrocketed >150 from 3/18, down significantly  Resistance pattern of the Pseudomonas vs Enterobacter, switch to Cefepipme  Appreciate ID consultant   Foley catheter exchanged in the ED  Sacral spine pressure ulcer-pt previously had wound vac in place with removal   Wound Care consulted appreciate input   Fungitell < 31  HEME: Lab Results  Component Value Date   HCT 24.8 (L) 12/16/2020    Anemia without obvious acute blood loss  Trend CBC   Monitor for s/sx of bleeding  Transfused today for hgb <7  NEURO:  Quadriplegia-bed bound  Acute toxic encephalopathy   Frequent reorientation   Will attempt to avoid sedating medications   Best practice (evaluated daily)  Diet:  NPO Pain/Anxiety/Delirium protocol (if indicated): Yes (RASS goal 0) VAP protocol (if indicated): Yes DVT prophylaxis: Systemic AC GI prophylaxis: H2B Glucose control:  SSI No Central venous access:  Yes, and it is still needed Arterial line:  N/A Foley:  Yes, and it is  still needed Mobility:  bed rest  PT consulted: N/A Last date of multidisciplinary goals of care discussion [N/A] Code Status:  full code Disposition: ICU   Labs   CBC: Recent Labs  Lab 12/12/20 0542 12/13/20 0550 12/14/20 0302 12/15/20 0343 12/16/20 0450  WBC 9.8 12.8* 13.1* 14.6* 12.5*  NEUTROABS  --  9.5* 10.2* 11.6*  --   HGB 7.3* 7.6* 7.0* 6.9* 7.8*  HCT 23.9* 24.1* 22.6* 22.6* 24.8*  MCV 95.2 93.1 94.6 94.6 91.5  PLT 98* 139* 134* 156 016    Basic Metabolic Panel: Recent Labs  Lab 12/12/20 0542 12/13/20 0550 12/14/20 0302 12/15/20 0343 12/16/20 0450  NA 155* 151* 148* 145 144  K 3.6 3.3* 3.4* 3.4* 3.4*  CL 122* 119* 117* 113* 110  CO2 '28 27 27 28 29  ' GLUCOSE 167* 170* 187* 160* 192*  BUN 40* 40* 36* 35* 32*  CREATININE 0.89 0.70 0.65 0.62 0.53*  CALCIUM 9.1 9.1 9.0 9.0 9.0  MG 2.3 2.2 2.0 1.9 1.8  PHOS 2.5 2.6 2.2* 3.2 2.3*   GFR: Estimated Creatinine Clearance: 130.4 mL/min (A) (by C-G formula based on SCr of 0.53 mg/dL (L)). Recent Labs  Lab 12/13/20 0550 12/14/20 0302 12/15/20 0343 12/16/20 0450  PROCALCITON 32.49  --   --   --   WBC 12.8* 13.1* 14.6* 12.5*    Liver Function Tests: No results for input(s): AST, ALT, ALKPHOS, BILITOT, PROT, ALBUMIN in the last 168 hours. No results for input(s): LIPASE, AMYLASE in the last 168 hours. No results for input(s): AMMONIA in the last 168 hours.  ABG    Component Value Date/Time   PHART 7.38 12/08/2020 0500   PCO2ART 42 12/08/2020 0500   PO2ART 115 (H) 12/08/2020 0500   HCO3 24.8 12/08/2020 0500   ACIDBASEDEF 0.4 12/08/2020 0500   O2SAT 98.4 12/08/2020 0500     Coagulation Profile: No results for input(s): INR, PROTIME in the last 168 hours.  Cardiac Enzymes: No results for input(s): CKTOTAL, CKMB, CKMBINDEX, TROPONINI in the last 168 hours.  HbA1C: Hgb A1c MFr Bld  Date/Time Value Ref Range Status  06/09/2020 07:00 AM 5.8 (H) 4.8 - 5.6 % Final    Comment:    (NOTE) Pre diabetes:  5.7%-6.4%  Diabetes:              >6.4%  Glycemic control for   <7.0% adults with diabetes     CBG: Recent Labs  Lab 12/16/20 0028 12/16/20 0409 12/16/20 0730 12/16/20 1050 12/16/20 1554  GLUCAP 130* 152* 173* 159* 141*    Review of Systems:   Unable to assess pt chronically trached currently requiring mechanical ventilation   Past Medical History:  He,  has a past medical history of Acute on chronic respiratory failure with hypoxia ([place]), Acute renal injury due to hypovolemia [facility]), Autonomic instability, Cardiac arrest [facility]), Cervical spinal cord injury, sequela ([place]), Elevated alkaline phosphatase level, 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   Surgical History:   Past Surgical History:  Procedure Laterality Date  . APPLICATION OF WOUND VAC N/A 08/16/2020   Procedure: APPLICATION OF WOUND VAC;  Surgeon: Fredirick Maudlin, MD;  Location: [facility] ORS;  Service: General;  Laterality: N/A;  . COLONOSCOPY WITH PROPOFOL N/A 12/05/2017   Procedure: COLONOSCOPY WITH PROPOFOL;  Surgeon: Lin Landsman, MD;  Location: [facility] ENDOSCOPY;  Service: Gastroenterology;  Laterality: N/A;  . ESOPHAGOGASTRODUODENOSCOPY  12/05/2017   Procedure: ESOPHAGOGASTRODUODENOSCOPY (EGD);  Surgeon: Lin Landsman, MD;  Location: [facility];  Service: Gastroenterology;;  . INCISION AND DRAINAGE ABSCESS N/A 08/16/2020   Procedure: INCISION AND DRAINAGE ABSCESS, SACRAL;  Surgeon: Fredirick Maudlin, MD;  Location: [facility] ORS;  Service: General;  Laterality: N/A;     Social History:   reports that he has never smoked. He has never used smokeless tobacco. He reports previous alcohol use. He reports that he does not use drugs.   Family History:  His family history includes Alcohol abuse in his brother; Cancer in his brother and father; Heart disease (age of onset: 50) in his brother; Liver cancer in his brother.   Allergies Allergies  Allergen Reactions   . Shrimp (Diagnostic)     Experiences facial droop when eating seafood     Home Medications  Prior to Admission medications   Medication Sig Start Date End Date Taking? Authorizing Provider  acetaminophen (TYLENOL) 160 MG/5ML solution Place 10-20 mLs (320-640 mg total) into feeding tube every 4 (four) hours as needed for mild pain. 07/18/20   Love, Ivan Anchors, PA-C  ascorbic acid (VITAMIN C) 500 MG tablet Place 1 tablet (500 mg total) into feeding tube 2 (two) times daily. 07/18/20   Love, Ivan Anchors, PA-C  baclofen (LIORESAL) 10 MG tablet Take 1 tablet (10 mg total) by mouth 3 (three) times daily. 11/12/20   Johnson, Megan P, DO  bisacodyl (DULCOLAX) 10 MG suppository Place 1 suppository (10 mg total) rectally at bedtime. 07/20/20   Love, Ivan Anchors, PA-C  chlorhexidine (PERIDEX) 0.12 % solution 15 mLs by Mouth Rinse route 2 (two) times daily. 07/20/20   Love, Ivan Anchors, PA-C  collagenase (SANTYL) ointment Apply topically daily. Patient not taking: Reported on 11/07/2020 07/20/20   Love, Ivan Anchors, PA-C  diclofenac Sodium (VOLTAREN) 1 % GEL Apply 4 g topically 4 (four) times daily. 08/31/20   Kathrine Haddock, NP  famotidine (PEPCID) 20 MG tablet Place 1 tablet (20 mg total) into feeding tube daily. 07/20/20   Love, Ivan Anchors, PA-C  ferrous sulfate 300 (60 Fe) MG/5ML syrup Place 1.3 mLs (78 mg total) into feeding tube daily. 07/20/20   Love, Ivan Anchors, PA-C  fludrocortisone (FLORINEF) 0.1 MG tablet Take 200 mcg by mouth daily. 11/02/20   [provider]  FLUoxetine (  PROZAC) 20 MG/5ML solution Place 10 mLs (40 mg total) into feeding tube daily. 11/22/20   Vigg, Avanti, MD  guaiFENesin 200 MG tablet Place 2 tablets (400 mg total) into feeding tube every 6 (six) hours. 07/20/20   Love, Ivan Anchors, PA-C  lidocaine (LIDODERM) 5 % Place 1 patch onto the skin daily. Remove & Discard patch within 12 hours or as directed by MD 07/20/20   Love, Ivan Anchors, PA-C  midodrine (PROAMATINE) 10 MG tablet Place 1 tablet  (10 mg total) into feeding tube 3 (three) times daily with meals. 11/04/20   Park Liter P, DO  Mouthwashes (MOUTH RINSE) LIQD solution 15 mLs by Mouth Rinse route 2 times daily at 12 noon and 4 pm. 07/20/20   Love, Ivan Anchors, PA-C  Multiple Vitamin (MULTIVITAMIN) LIQD Place 5 mLs into feeding tube daily. 07/20/20   Love, Ivan Anchors, PA-C  nutrition supplement, JUVEN, (JUVEN) PACK Place 1 packet into feeding tube 2 (two) times daily between meals. 07/20/20   Love, Ivan Anchors, PA-C  Nutritional Supplements (FEEDING SUPPLEMENT, OSMOLITE 1.5 CAL,) LIQD Place 474 mLs into feeding tube 4 (four) times daily. 07/20/20   Love, Ivan Anchors, PA-C  Nutritional Supplements (FEEDING SUPPLEMENT, PROSOURCE TF,) liquid Place 45 mLs into feeding tube 2 (two) times daily. 07/20/20   Love, Ivan Anchors, PA-C  ondansetron (ZOFRAN) 4 MG tablet Place 1 tablet (4 mg total) into feeding tube every 8 (eight) hours as needed for nausea, vomiting or refractory nausea / vomiting. 07/20/20   Love, Ivan Anchors, PA-C  polycarbophil (FIBERCON) 625 MG tablet Place 1 tablet (625 mg total) into feeding tube daily. 07/20/20   Love, Ivan Anchors, PA-C  rivaroxaban (XARELTO) 10 MG TABS tablet Take one tablet per tube daily 08/08/20   [name], MD  saccharomyces boulardii (FLORASTOR) 250 MG capsule Place 1 capsule (250 mg total) into feeding tube 2 (two) times daily. 07/20/20   Love, Ivan Anchors, PA-C  sennosides (SENOKOT) 8.8 MG/5ML syrup Place 5 mLs into feeding tube daily at 6 (six) AM. 07/20/20   Love, Ivan Anchors, PA-C  simethicone (MYLICON) 40 WJ/1.9JY drops Place 1.2 mLs (80 mg total) into feeding tube 4 (four) times daily. 07/20/20   Love, Ivan Anchors, PA-C  traZODone (DESYREL) 50 MG tablet Place 1 tablet (50 mg total) into feeding tube at bedtime. 10/23/20   Jon Billings, NP  Water For Irrigation, Sterile (FREE WATER) SOLN Place 400 mLs into feeding tube every 4 (four) hours. Patient taking differently: Place 300 mLs into feeding tube every 4  (four) hours. 07/20/20   Love, Ivan Anchors, PA-C  zinc sulfate 220 (50 Zn) MG capsule Place 1 capsule (220 mg total) into feeding tube daily. 07/20/20   Bary Leriche, PA-C         Critical care provider statement:    Critical care time (minutes):  35   Critical care time was exclusive of:  Separately billable procedures and  treating other patients   Critical care was necessary to treat or prevent imminent or  life-threatening deterioration of the following conditions:  mucu plugging, acute respiratory distress with hypoxemia, possible sepsis   Critical care was time spent personally by me on the following  activities:  Development of treatment plan with patient or surrogate,  discussions with consultants, evaluation of patient's response to  treatment, examination of patient, obtaining history from patient or  surrogate, ordering and performing treatments and interventions, ordering  and review of laboratory studies and re-evaluation of patient's  condition   I assumed direction of critical care for this patient from another  provider in my specialty: no

## 2020-12-17 ENCOUNTER — Inpatient Hospital Stay: Payer: Medicare Other

## 2020-12-17 DIAGNOSIS — J9611 Chronic respiratory failure with hypoxia: Secondary | ICD-10-CM | POA: Diagnosis not present

## 2020-12-17 DIAGNOSIS — Z930 Tracheostomy status: Secondary | ICD-10-CM | POA: Diagnosis not present

## 2020-12-17 DIAGNOSIS — G8250 Quadriplegia, unspecified: Secondary | ICD-10-CM | POA: Diagnosis not present

## 2020-12-17 LAB — PHOSPHORUS: Phosphorus: 2.5 mg/dL (ref 2.5–4.6)

## 2020-12-17 LAB — MAGNESIUM: Magnesium: 2 mg/dL (ref 1.7–2.4)

## 2020-12-17 LAB — VANCOMYCIN, TROUGH: Vancomycin Tr: 18 ug/mL (ref 15–20)

## 2020-12-17 LAB — GLUCOSE, CAPILLARY
Glucose-Capillary: 161 mg/dL — ABNORMAL HIGH (ref 70–99)
Glucose-Capillary: 153 mg/dL — ABNORMAL HIGH (ref 70–99)
Glucose-Capillary: 123 mg/dL — ABNORMAL HIGH (ref 70–99)
Glucose-Capillary: 171 mg/dL — ABNORMAL HIGH (ref 70–99)
Glucose-Capillary: 159 mg/dL — ABNORMAL HIGH (ref 70–99)
Glucose-Capillary: 149 mg/dL — ABNORMAL HIGH (ref 70–99)

## 2020-12-17 LAB — BASIC METABOLIC PANEL
Anion gap: 6 (ref 5–15)
BUN: 32 mg/dL — ABNORMAL HIGH (ref 8–23)
CO2: 27 mmol/L (ref 22–32)
Calcium: 9.1 mg/dL (ref 8.9–10.3)
Chloride: 107 mmol/L (ref 98–111)
Creatinine, Ser: 0.57 mg/dL — ABNORMAL LOW (ref 0.61–1.24)
GFR, Estimated: >60 mL/min (ref >60)
Glucose, Bld: 169 mg/dL — ABNORMAL HIGH (ref 70–99)
Potassium: 3.7 mmol/L (ref 3.5–5.1)
Sodium: 140 mmol/L (ref 135–145)

## 2020-12-17 LAB — CBC
HCT: 22.7 % — ABNORMAL LOW (ref 39.0–52.0)
Hemoglobin: 7.2 g/dL — ABNORMAL LOW (ref 13.0–17.0)
MCH: 28.8 pg (ref 26.0–34.0)
MCHC: 31.7 g/dL (ref 30.0–36.0)
MCV: 90.8 fL (ref 80.0–100.0)
Platelets: 158 10*3/uL (ref 150–400)
RBC: 2.5 MIL/uL — ABNORMAL LOW (ref 4.22–5.81)
RDW: 16.9 % — ABNORMAL HIGH (ref 11.5–15.5)
WBC: 8.7 10*3/uL (ref 4.0–10.5)
nRBC: 0.3 % — ABNORMAL HIGH (ref 0.0–0.2)

## 2020-12-17 LAB — BRAIN NATRIURETIC PEPTIDE: B Natriuretic Peptide: 103.7 pg/mL — ABNORMAL HIGH (ref 0.0–100.0)

## 2020-12-17 LAB — TROPONIN I (HIGH SENSITIVITY): Troponin I (High Sensitivity): 5 ng/L (ref <18)

## 2020-12-17 IMAGING — DX DG CHEST 1V PORT
1 series · 1 of 1 positions shown · non-contrast
Comparison: [DATE]

CLINICAL DATA: Respiratory failure.  Hypoxia.

EXAM:
PORTABLE CHEST 1 VIEW

[chest ap]
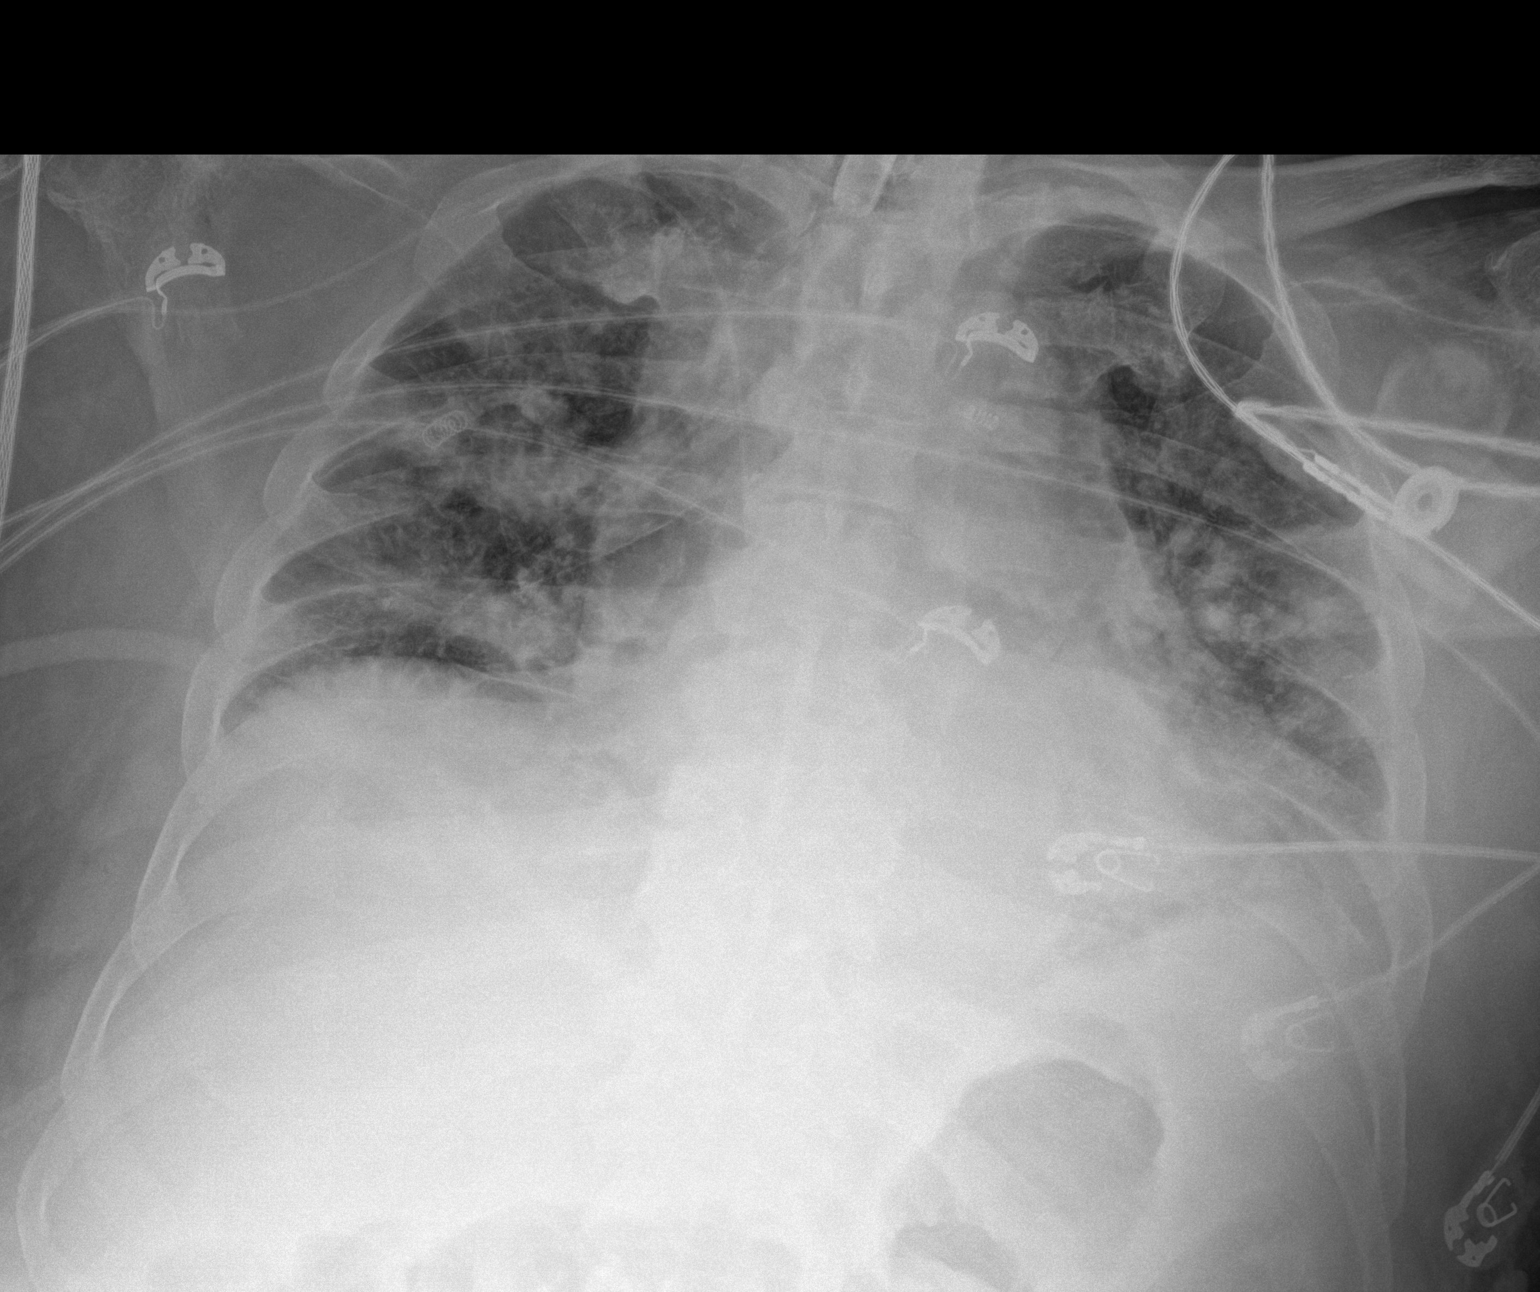

[1 of 1 positions shown; findings below may reference images not displayed]

FINDINGS: Right arm PICC line tip is in the distal SVC. Tracheostomy tube tip
remains above the carina. Unchanged cardiac enlargement. Diffuse
interstitial and airspace densities are unchanged compared with the
previous exam.
IMPRESSION: 1. No change in aeration a lungs compared with previous exam.
2. Stable support apparatus.

## 2020-12-17 MED ORDER — FUROSEMIDE 10 MG/ML IJ SOLN
40.0000 mg | Freq: Once | INTRAMUSCULAR | Status: AC
  Administered 2020-12-17: 40 mg via INTRAVENOUS
  Filled 2020-12-17: qty 4

## 2020-12-17 MED ORDER — VANCOMYCIN HCL 750 MG/150ML IV SOLN
750.0000 mg | Freq: Two times a day (BID) | INTRAVENOUS | Status: DC
  Administered 2020-12-17 – 2020-12-20 (×6): 750 mg via INTRAVENOUS
  Filled 2020-12-17 (×7): qty 150

## 2020-12-17 MED ORDER — MIDAZOLAM HCL 2 MG/2ML IJ SOLN
1.0000 mg | INTRAMUSCULAR | Status: DC | PRN
  Administered 2020-12-17 – 2020-12-20 (×8): 2 mg via INTRAVENOUS
  Filled 2020-12-17 (×8): qty 2

## 2020-12-17 MED ORDER — BACLOFEN 10 MG PO TABS
10.0000 mg | ORAL_TABLET | Freq: Four times a day (QID) | ORAL | Status: DC
  Administered 2020-12-17 – 2020-12-20 (×11): 10 mg via ORAL
  Filled 2020-12-17 (×12): qty 1

## 2020-12-17 NOTE — Consult Note (Signed)
Ryan Day for Electrolyte Monitoring and Replacement   Recent Labs: Potassium (mmol/L)  Date Value  12/17/2020 3.7   Magnesium (mg/dL)  Date Value  12/17/2020 2.0   Calcium (mg/dL)  Date Value  12/17/2020 9.1   Albumin (g/dL)  Date Value  12/07/2020 3.1 (L)  02/01/2020 4.4   Phosphorus (mg/dL)  Date Value  12/17/2020 2.5   Sodium (mmol/L)  Date Value  12/17/2020 140  02/01/2020 143   Corrected Ca: 9.7  Assessment: Patient is a 70 y/o M presented with sepsis. Pt presented with complaints of recurrent UTI and hematuria. 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 Pt has chronic tracheostomy and PEG tube. PMH includes HTN and HLD. Pharmacy has been consulted to assist with electrolyte monitoring and replacement as indicated. Scr trending down 1.87 > 1.66>1.09>0.89 > 0.7 > 0.65.   Nutrition: tube feeds @ 70 mL/hr, prosource tube feeds once daily   MIVF: D5 @ 50 mL/hr  3/22: Desmopressin 8mcg nasal BID  Goal of Therapy:  Electrolytes within normal limits  Plan:  K 3.7  Mag 2.0  Phos 2.5  Na 140  Scr 0.57 -- Na 140 -  free water 200 mL per tube q2h, desmopressin BID, D5 MIVF   -- Follow-up electrolytes with AM labs  Chinita Greenland PharmD Clinical Pharmacist 12/17/2020

## 2020-12-17 NOTE — Progress Notes (Signed)
Pharmacy Antibiotic Note  Ryan Day is a 70 y.o. male admitted on 12/07/2020 with sepsis. Pt presented with complaints of recurrent UTI and hematuria with foley cath last placed ~2 weeks ago, exchanged in ED. 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 Pt has PEG and trache. Pt admitted 11/22-12/5 for HCAP and sacral ulcer with osteomyelitis. Hx of enterobacter cloacae, pseudomonas in ucx. Pt has received vanc, Unasyn, Zosyn, and cefepime on recent previous admissions. ID consulted. Pt with polymicrobial bacteremia with sepsis resulting from multiple sources including UTI, PNA, and sacral wound. Meropenem was discontinued 3/22 based on sensitivity to ceftazidime from BAL. IV Cipro was added due to increased secretions. Pharmacy has been consulted for Vancomycin dosing.   Scr improved: 0.53   baseline(~0.5)  Day 12 abx, day 6 cipro  Plan: -- Continue Cipro IV 400 mg q8h  -- Continue Cefepime 2 grams q8h   Vanc levels ordered with 3/26 dose:  Vanc dose 1000 mg hung 3/26 @ 1333  Vanc pk 3/26 @ 1604: 27 mcg/ml  (~1.5 hrs post end of infusion) Vanc trough 3/27 @ 0130= 18 mcg/ml Ke 0.0542  t1/2 12.8    -Will adjust Vancomycin dose from 1048m IV q12h to 7540mQ12h.   Goal AUC 400-550. Expected AUC: 442 SCr used: 0.53   Cmin 13.5   --Continue to monitor creatinine daily     Height: 6' 5.01" (195.6 cm) Weight: 130 kg (286 lb 9.6 oz) IBW/kg (Calculated) : 89.12  Temp (24hrs), Avg:96.6 F (35.9 C), Min:96 F (35.6 C), Max:97.2 F (36.2 C)  Recent Labs  Lab 12/11/20 0547 12/12/20 0542 12/12/20 0601 12/13/20 0550 12/14/20 0302 12/15/20 0101 12/15/20 0343 12/16/20 0450 12/16/20 0949 12/16/20 1604 12/17/20 0130 12/17/20 0444 12/17/20 0623  WBC 11.2*   < >  --  12.8* 13.1*  --  14.6* 12.5*  --   --   --  8.7  --   CREATININE 1.09   < >  --  0.70 0.65  --  0.62 0.53*  --   --   --   --  0.57*  VANCOTROUGH  --   --   --   --   --   --   --   --   22*  --  18  --   --   VANCOPEAK  --   --   --   --   --  27*  --   --   --  30  --   --   --   VANCORANDOM 14  --  12  --   --   --   --   --   --   --   --   --   --    < > = values in this interval not displayed.    Estimated Creatinine Clearance: 131.9 mL/min (A) (by C-G formula based on SCr of 0.57 mg/dL (L)).    Allergies  Allergen Reactions  . Shrimp (Diagnostic)     Experiences facial droop when eating seafood    Antimicrobials this admission: 3/17 Vancomycin  >>  3/17 Cefepime  >> 3/20; 3/22 >> 3/17 Zosyn x 1  3/20 Meropenem >> 3/21  3/22 Cipro >>  Dose adjustments this admission: Cefepime 2g q24h>q12h>q8h based on improving renal function 3/27 Vanc 1 gm q12h to 750 q12h  3/17 vanc 1g x 1 dose 3/18 vanc 2g x 1 dose 3/19 vanc random @ 0454: 20 - vanc  2g x 1 dose 3/20 vanc random @ 0652: 23 - no redose 3/21 vanc random @ 0547: 14 - vanc 1 g x 1 dose 3/22 vanc random @ 0601: 12 - vanc 1750 mg x 1 dose 3/23 vanc 1079m q12h scheduled 3/25 Vanc pk 3/25 @ 0101: 27   Unable to get the trough prior to next dose d/t pt getting blood 3/26 Vanc trough= 22  But dose hung 2.5 hours late?  F/u levels after next dose 3/26 peak _0 = 30 3/27 trough @ 0130= 18  Microbiology results: 3/17 MRSA PCR: positive 3/17 BCx: e. Cloacae, MRSA, e. Faecalis 3/17 UCx: e. cloacae 3/18 resp cx: pseudomonas aeruginosa 3/18 BAL: Pseudomonas aeruginosa (ceftazidime sensitive), MRSA, e. faecalis 3/21 sacral cx: MRSA, pseudomonas aeruginosa 3/23 BCx: NGTD  Thank you for allowing pharmacy to be a part of this patient's care.  KChinita GreenlandPharmD Clinical Pharmacist 12/17/2020

## 2020-12-17 NOTE — Progress Notes (Addendum)
NAME:  Ryan Day, MRN:  [id number], DOB:  1951/03/29, LOS: 72 ADMISSION DATE:  12/07/2020, CONSULTATION DATE: 12/07/2020 REFERRING MD: Dr. 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  According to pts family last week pt noted to have sediment in his urine at home concerning for UTI.  He had a foley catheter placed 2 weeks ago and completed a 7 day course of abx therapy due to UTI.  Due to concerns of recurrent UTI pts family contacted pts PCP office last week, and heard from his PCP this week and were awaiting urine culture results.  However, pt noted to have hematuria yesterday and today along with sinus tachycardia hr 120's.  Pts daughter is a Marine scientist and due to concern of sepsis pt transported to the ER via EMS for further evaluation and treatment.    ED course Upon arrival to the ER pts O2 sats were 75-80% on RA, therefore pt placed on NRB with O2 sats initially increasing to 100%.  Pt transitioned to trach collar with initial O2 sats in the low 90's.  However, pt noted to have mucous plugging resulting in hypoxia requiring exchange of tracheostomy from a cuffless trach to a size 6 mm cuffed trach and pt placed on mechanical ventilation.  ER vital signs were: temp 101.7 rectally, sbp 60-80's, and hr 130's. Lab results revealed Na+ 153, chloride 119, glucose 147, lactic acid 2.8, wbc 2.0, hbg 11.1, and UA positive for UTI.  CXR negative and COVID-19/Influenza PCR results pending.  Sepsis protocol initiated and pt received cefepime, vancomycin, and 2L LR bolus.  Pt remained severely hypotensive requiring levophed gtt and right femoral central line placement.  PCCM team contacted for ICU admission.    12/08/20- patient is improved some he is communicative.  Wife [name] at bedside we reviewed care plan.   12/10/20- patient is improved he is down to 3mg on levophed and  30%Fio2 on ventilator, mentating well. Reviewed careplan with wife LVaughan Bastatoday.  12/11/20 PICC 12/12/20 Mucus plug, cleared with PPV-ambu 12/17/2020 increased FiO2 requirements, increased work of breathing.  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 C5 Cervical Fracture (following a mechanical fall at home) s/p C3-6 Laminectomy and Posterior Instrumentation and Fusion 08/4  Quadriplegia  Spinal Cord Compression  Chronic Tracheostomy and PEG    Significant Hospital Events: Including procedures, antibiotic start and stop dates in addition to other pertinent events   03/17: Pt admitted to ICU with urosepsis requiring vasopressors and acute on chronic hypoxic respiratory failure secondary to mucous plugging requiring exchange of cuffless trach to size 6 mm cuffed trach to be placed on mechanical ventilation  03/17: Right femoral central line placed by ER physician  03/17: CT Abd Pelvis findings with cystitis. Correlation with urinalysis is Recommended. Mild to moderate severity bibasilar atelectasis and/or Infiltrate. Sigmoid diverticulosis. Sacral decubitus ulcer, as described above. MRI correlation is recommended, as sequelae associated with acute osteomyelitis cannot be excluded. Aortic atherosclerosis. 03/17: CT Head revealed mild chronic ischemic white matter disease. No acute intracranial abnormality seen 03/17: Vancomycin x1 dose and Cefepime x1 dose 03/17: Zosyn>>completed 03/27: On Cefepime  Interim History / Subjective:  Pt lethargic no signs of respiratory distress mechanically intubated via tracheostomy   Objective   Blood pressure 95/63, pulse (Marland Kitchen  44, temperature (!) 96 F (35.6 C), temperature source Oral, resp. rate (!) 27, height 6' 5.01" (1.956 m), weight 130 kg, SpO2 97 %.    Vent Mode: PRVC FiO2 (%):  [30 %] 30 % Set Rate:  [24 bmp] 24  bmp Vt Set:  [500 mL] 500 mL PEEP:  [8 cmH20] 8 cmH20 Plateau Pressure:  [22 cmH20-26 cmH20] 26 cmH20   Intake/Output Summary (Last 24 hours) at 12/17/2020 1540 Last data filed at 12/17/2020 0600 Gross per 24 hour  Intake 4242.61 ml  Output 4850 ml  Net -607.39 ml   Filed Weights   12/15/20 0500 12/16/20 0448 12/17/20 0500  Weight: 124.9 kg 127 kg 130 kg   Examination: General: chronically appearing male, NAD receiving mechanical ventilation via trach  HENT: supple, no JVD  Lungs: diminished throughout, even, non labored  Cardiovascular: A. fib with CVR, no R/G, 2+ radial/1+ distal pulses, 2+ bilateral lower extremity edema  Abdomen: hypoactive BS x, soft, non distended, PEG in place Extremities: quadriplegic Neuro: communicates to verbal language Skin: sacral spine decubitus  GU: chronic foley in place   Labs/imaging personally reviewed   CT Abd/Pelvis CXR CT Head  UA  Resolved Hospital Problem list   N/A  Assessment & Plan:   RESPIRATORY:   Acute on chronic hypoxic respiratory failure secondary to mucous plugging which is chronic   Mechanical ventilation via chronic tracheostomy        S/p bronch - many GPCs , cleared bilateral airways   Full vent support for now-vent settings reviewed and established  SBT once all parameters met  VAP bundle implemented   Aggressive pulmonary hygiene   Check BNP  CXR worsening infiltrates noted above on 3/22, recheck chest x-ray today   CARDIOVASCULAR:  ECHO  1.EF 55 to 60%. LVH G2DD  2. RV size and function normal   3. The mitral valve is normal in structure. No MR   4. The aortic valve is grossly normal. No AR  A. fib with CVR  Hemodynamically stable   Septic shock, resolved  Continuous telemetry monitoring   Aggressive fluid resuscitation and levophed/vasopressin gtts to maintain map >65  RENAL: Lab Results  Component Value Date   CREATININE 0.57 (L) 12/17/2020   BUN 32 (H) 12/17/2020   NA 140  12/17/2020   K 3.7 12/17/2020   CL 107 12/17/2020   CO2 27 12/17/2020    Intake/Output Summary (Last 24 hours) at 12/17/2020 0867 Last data filed at 12/17/2020 0600 Gross per 24 hour  Intake 4242.61 ml  Output 4850 ml  Net -607.39 ml   Net IO Since Admission: -12,293.22 mL [12/17/20 0942]   Mild acute renal failure secondary to ATN, resolved  Anion Gap Metabolic Acidosis, Severe lactic acidosis   Trend BMP and lactic acid, gap resolved, lactic 2.3  Replace electrolytes as indicated   Monitor UOP, despite all the free water and D5W Na remains elevated  Trial vasopressin and monitor Uop, finally Na <150  Will now plan to obtain DDAVP nasal spray and replace the IV dosing  Hopefully will decrease water sources   Avoid nephrotoxic medications   ID: Lab Results  Component Value Date   WBC 8.7 12/17/2020    Urosepsis secondary to chronic foley   Leukopenia  Septic Shock, resolved   WBC normal today   PCT on 3/23 was 32.49, will need  Resistance pattern of the Pseudomonas vs Enterobacter, on Cefepipme  Appreciate ID consultants input  Foley catheter exchanged in the ED  Sacral spine pressure ulcer-pt previously had wound vac in place with removal   Wound Care consulted appreciate input   Fungitell < 31  HEME: Lab Results  Component Value Date   HCT 22.7 (L) 12/17/2020    Anemia without obvious acute blood loss  Trend CBC   Monitor for s/sx of bleeding  Transfused 2 days ago for hgb <7   NEURO:  Quadriplegia-bed bound  Acute toxic encephalopathy   Frequent reorientation   Improving  Avoid sedating medications  Best practice (evaluated daily)  Diet:  NPO Pain/Anxiety/Delirium protocol (if indicated): Yes (RASS goal 0) VAP protocol (if indicated): Yes DVT prophylaxis: Systemic AC GI prophylaxis: H2B Glucose control:  SSI No Central venous access:  Yes, and it is still needed Arterial line:  N/A Foley:  Yes, and it is still  needed Mobility:  bed rest  PT consulted: N/A Last date of multidisciplinary goals of care discussion [N/A]  Code Status:  full code Disposition: ICU   Labs   CBC: Recent Labs  Lab 12/13/20 0550 12/14/20 0302 12/15/20 0343 12/16/20 0450 12/17/20 0444  WBC 12.8* 13.1* 14.6* 12.5* 8.7  NEUTROABS 9.5* 10.2* 11.6*  --   --   HGB 7.6* 7.0* 6.9* 7.8* 7.2*  HCT 24.1* 22.6* 22.6* 24.8* 22.7*  MCV 93.1 94.6 94.6 91.5 90.8  PLT 139* 134* 156 176 092    Basic Metabolic Panel: Recent Labs  Lab 12/13/20 0550 12/14/20 0302 12/15/20 0343 12/16/20 0450 12/17/20 0623  NA 151* 148* 145 144 140  K 3.3* 3.4* 3.4* 3.4* 3.7  CL 119* 117* 113* 110 107  CO2 '27 27 28 29 27  ' GLUCOSE 170* 187* 160* 192* 169*  BUN 40* 36* 35* 32* 32*  CREATININE 0.70 0.65 0.62 0.53* 0.57*  CALCIUM 9.1 9.0 9.0 9.0 9.1  MG 2.2 2.0 1.9 1.8 2.0  PHOS 2.6 2.2* 3.2 2.3* 2.5   GFR: Estimated Creatinine Clearance: 131.9 mL/min (A) (by C-G formula based on SCr of 0.57 mg/dL (L)). Recent Labs  Lab 12/13/20 0550 12/14/20 0302 12/15/20 0343 12/16/20 0450 12/17/20 0444  PROCALCITON 32.49  --   --   --   --   WBC 12.8* 13.1* 14.6* 12.5* 8.7    Liver Function Tests: No results for input(s): AST, ALT, ALKPHOS, BILITOT, PROT, ALBUMIN in the last 168 hours. No results for input(s): LIPASE, AMYLASE in the last 168 hours. No results for input(s): AMMONIA in the last 168 hours.  ABG    Component Value Date/Time   PHART 7.38 12/08/2020 0500   PCO2ART 42 12/08/2020 0500   PO2ART 115 (H) 12/08/2020 0500   HCO3 24.8 12/08/2020 0500   ACIDBASEDEF 0.4 12/08/2020 0500   O2SAT 98.4 12/08/2020 0500     Coagulation Profile: No results for input(s): INR, PROTIME in the last 168 hours.  Cardiac Enzymes: No results for input(s): CKTOTAL, CKMB, CKMBINDEX, TROPONINI in the last 168 hours.  HbA1C: Hgb A1c MFr Bld  Date/Time Value Ref Range Status  06/09/2020 07:00 AM 5.8 (H) 4.8 - 5.6 % Final    Comment:     (NOTE) Pre diabetes:          5.7%-6.4%  Diabetes:              >6.4%  Glycemic control for   <7.0% adults with diabetes     CBG: Recent Labs  Lab 12/16/20 1554 12/16/20 1946 12/16/20 2337 12/17/20 0428 12/17/20 0740  GLUCAP 141* 143* 165* 161* 153*    Review of  Systems:   Unable to assess pt chronically trached currently requiring mechanical ventilation   Allergies Allergies  Allergen Reactions  . Shrimp (Diagnostic)     Experiences facial droop when eating seafood     Scheduled Meds: . ascorbic acid  500 mg Per Tube BID  . chlorhexidine gluconate (MEDLINE KIT)  15 mL Mouth Rinse BID  . Chlorhexidine Gluconate Cloth  6 each Topical Daily  . desmopressin  10 mcg Nasal BID  . diclofenac Sodium  4 g Topical QID  . feeding supplement (PROSource TF)  45 mL Per Tube Daily  . ferrous sulfate  75 mg Per Tube Daily  . fludrocortisone  200 mcg Per Tube Daily  . FLUoxetine  40 mg Per Tube Daily  . free water  200 mL Per Tube Q2H  . guaiFENesin  400 mg Per Tube Q6H  . insulin aspart  0-15 Units Subcutaneous Q4H  . ipratropium-albuterol  3 mL Nebulization TID  . mouth rinse  15 mL Mouth Rinse 10 times per day  . nutrition supplement (JUVEN)  1 packet Per Tube BID BM  . polycarbophil  625 mg Per Tube Daily  . rivaroxaban  10 mg Per Tube Daily  . saccharomyces boulardii  250 mg Per Tube BID  . sennosides  5 mL Per Tube Q0600  . simethicone  80 mg Per Tube QID  . sodium chloride flush  10-40 mL Intracatheter Q12H  . sodium chloride HYPERTONIC  3 mL Nebulization TID  . zinc sulfate  220 mg Per Tube Daily   Continuous Infusions: . ceFEPime (MAXIPIME) IV Stopped (12/17/20 9528)  . ciprofloxacin 200 mL/hr at 12/17/20 1000  . dextrose 50 mL/hr at 12/15/20 1342  . famotidine (PEPCID) IV 100 mL/hr at 12/17/20 1000  . feeding supplement (PIVOT 1.5 CAL) 1,000 mL (12/16/20 1751)  . vancomycin 1,000 mg (12/17/20 0245)   PRN Meds:.acetaminophen, bisacodyl, docusate sodium,  fentaNYL (SUBLIMAZE) injection, midazolam, ondansetron (ZOFRAN) IV, polyethylene glycol, sodium chloride flush    The patient is critically ill with multiple organ systems failure and requires high complexity decision making for assessment and support, frequent evaluation and titration of therapies, application of advanced monitoring technologies and extensive interpretation of multiple databases. Critical Care Time devoted to patient care services described in this note is 35 minutes.   Renold Don, MD [facility]   *This note was dictated using voice recognition software/Dragon.  Despite best efforts to proofread, errors can occur which can change the meaning.  Any change was purely unintentional.

## 2020-12-18 DIAGNOSIS — Z930 Tracheostomy status: Secondary | ICD-10-CM | POA: Diagnosis not present

## 2020-12-18 DIAGNOSIS — G8250 Quadriplegia, unspecified: Secondary | ICD-10-CM | POA: Diagnosis not present

## 2020-12-18 DIAGNOSIS — A4102 Sepsis due to Methicillin resistant Staphylococcus aureus: Secondary | ICD-10-CM | POA: Diagnosis not present

## 2020-12-18 DIAGNOSIS — J9621 Acute and chronic respiratory failure with hypoxia: Secondary | ICD-10-CM | POA: Diagnosis not present

## 2020-12-18 DIAGNOSIS — J151 Pneumonia due to Pseudomonas: Secondary | ICD-10-CM

## 2020-12-18 DIAGNOSIS — A419 Sepsis, unspecified organism: Secondary | ICD-10-CM | POA: Diagnosis not present

## 2020-12-18 DIAGNOSIS — R6521 Severe sepsis with septic shock: Secondary | ICD-10-CM | POA: Diagnosis not present

## 2020-12-18 DIAGNOSIS — B9689 Other specified bacterial agents as the cause of diseases classified elsewhere: Secondary | ICD-10-CM | POA: Diagnosis not present

## 2020-12-18 DIAGNOSIS — B952 Enterococcus as the cause of diseases classified elsewhere: Secondary | ICD-10-CM | POA: Diagnosis not present

## 2020-12-18 LAB — CBC
HCT: 23.9 % — ABNORMAL LOW (ref 39.0–52.0)
Hemoglobin: 7.6 g/dL — ABNORMAL LOW (ref 13.0–17.0)
MCH: 28.5 pg (ref 26.0–34.0)
MCHC: 31.8 g/dL (ref 30.0–36.0)
MCV: 89.5 fL (ref 80.0–100.0)
Platelets: 211 10*3/uL (ref 150–400)
RBC: 2.67 MIL/uL — ABNORMAL LOW (ref 4.22–5.81)
RDW: 16.5 % — ABNORMAL HIGH (ref 11.5–15.5)
WBC: 9.2 10*3/uL (ref 4.0–10.5)
nRBC: 0.2 % (ref 0.0–0.2)

## 2020-12-18 LAB — CULTURE, BLOOD (ROUTINE X 2)
Culture: NO GROWTH
Culture: NO GROWTH
Special Requests: ADEQUATE
Special Requests: ADEQUATE

## 2020-12-18 LAB — BASIC METABOLIC PANEL
Anion gap: 5 (ref 5–15)
BUN: 31 mg/dL — ABNORMAL HIGH (ref 8–23)
CO2: 31 mmol/L (ref 22–32)
Calcium: 9.2 mg/dL (ref 8.9–10.3)
Chloride: 102 mmol/L (ref 98–111)
Creatinine, Ser: 0.51 mg/dL — ABNORMAL LOW (ref 0.61–1.24)
GFR, Estimated: >60 mL/min (ref >60)
Glucose, Bld: 113 mg/dL — ABNORMAL HIGH (ref 70–99)
Potassium: 3.2 mmol/L — ABNORMAL LOW (ref 3.5–5.1)
Sodium: 138 mmol/L (ref 135–145)

## 2020-12-18 LAB — GLUCOSE, CAPILLARY
Glucose-Capillary: 126 mg/dL — ABNORMAL HIGH (ref 70–99)
Glucose-Capillary: 133 mg/dL — ABNORMAL HIGH (ref 70–99)
Glucose-Capillary: 134 mg/dL — ABNORMAL HIGH (ref 70–99)
Glucose-Capillary: 136 mg/dL — ABNORMAL HIGH (ref 70–99)
Glucose-Capillary: 163 mg/dL — ABNORMAL HIGH (ref 70–99)

## 2020-12-18 LAB — MAGNESIUM: Magnesium: 1.9 mg/dL (ref 1.7–2.4)

## 2020-12-18 LAB — PHOSPHORUS: Phosphorus: 2.5 mg/dL (ref 2.5–4.6)

## 2020-12-18 MED ORDER — POTASSIUM CHLORIDE 20 MEQ PO PACK
20.0000 meq | PACK | ORAL | Status: AC
  Administered 2020-12-18 (×2): 20 meq
  Filled 2020-12-18 (×2): qty 1

## 2020-12-18 MED ORDER — FREE WATER
30.0000 mL | Status: DC
  Administered 2020-12-18: 30 mL

## 2020-12-18 MED ORDER — TRAZODONE HCL 50 MG PO TABS
50.0000 mg | ORAL_TABLET | Freq: Every day | ORAL | Status: DC
  Administered 2020-12-18 – 2021-01-04 (×18): 50 mg
  Filled 2020-12-18 (×19): qty 1

## 2020-12-18 MED ORDER — POTASSIUM CHLORIDE 10 MEQ/50ML IV SOLN
10.0000 meq | INTRAVENOUS | Status: AC
  Administered 2020-12-18 (×4): 10 meq via INTRAVENOUS
  Filled 2020-12-18 (×4): qty 50

## 2020-12-18 MED ORDER — FREE WATER
100.0000 mL | Status: DC
  Administered 2020-12-18 – 2020-12-20 (×12): 100 mL

## 2020-12-18 NOTE — Progress Notes (Signed)
Pharmacy Antibiotic Note  Betzalel Umbarger is a 70 y.o. male admitted on 12/07/2020 with sepsis. Pt presented with complaints of recurrent UTI and hematuria with foley cath last placed ~2 weeks ago, exchanged in ED. 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 Pt has PEG and trache. Pt admitted 11/22-12/5 for HCAP and sacral ulcer with osteomyelitis. Hx of enterobacter cloacae, pseudomonas in ucx. Pt has received vanc, Unasyn, Zosyn, and cefepime on recent previous admissions. ID consulted. Pt with polymicrobial bacteremia with sepsis resulting from multiple sources including UTI, PNA, and sacral wound. Meropenem was discontinued 3/22 based on sensitivity to ceftazidime from BAL. IV Cipro was added due to increased secretions. Pharmacy has been consulted for Vancomycin dosing.   Scr improved: 0.51  baseline(~0.5)   Day 13 abx, day 7 cipro  Plan: -- Continue Cipro IV 400 mg q8h  -- Continue Cefepime 2 grams q8h  -- Continue Vancomycin 750 Q12h. Goal AUC 400-550.  --Expected AUC: 442  --SCr used: 0.53   Cmin 13.5 -- Monitor vanc levels for any dose adjustments; vanc peak ordered at 0500 3/29 and vanc trough ordered at 0100 3/29 -- Continue to monitor creatinine daily  Height: 6' 5.01" (195.6 cm) Weight: 130 kg (286 lb 9.6 oz) IBW/kg (Calculated) : 89.12   Temp (24hrs), Avg:96.8 F (36 C), Min:96.6 F (35.9 C), Max:97 F (36.1 C)  Recent Labs  Lab 12/12/20 0601 12/13/20 0550 12/14/20 0302 12/15/20 0101 12/15/20 0343 12/16/20 0450 12/16/20 0949 12/16/20 1604 12/17/20 0130 12/17/20 0444 12/17/20 0623 12/18/20 0524  WBC  --    < > 13.1*  --  14.6* 12.5*  --   --   --  8.7  --  9.2  CREATININE  --    < > 0.65  --  0.62 0.53*  --   --   --   --  0.57* 0.51*  VANCOTROUGH  --   --   --   --   --   --  22*  --  18  --   --   --   VANCOPEAK  --   --   --  27*  --   --   --  30  --   --   --   --   VANCORANDOM 12  --   --   --   --   --   --   --   --   --    --   --    < > = values in this interval not displayed.    Estimated Creatinine Clearance: 131.9 mL/min (A) (by C-G formula based on SCr of 0.51 mg/dL (L)).    Allergies  Allergen Reactions  . Shrimp (Diagnostic)     Experiences facial droop when eating seafood    Antimicrobials this admission: 3/17 Vancomycin  >>  3/17 Cefepime  >> 3/20; 3/22 >> 3/17 Zosyn x 1  3/20 Meropenem >> 3/21  3/22 Cipro >>  Dose adjustments this admission: 3/27 Vanc 1 gm q12h to 750 q12h Vanc pk 3/26 @ 1604: 27 mcg/ml  Vanc trough 3/27 @ 0130= 18 mcg/ml Ke 0.0542  t1/2 12.8    Microbiology results: 3/17 MRSA PCR: positive 3/17 BCx: e. Cloacae, MRSA, e. Faecalis 3/17 UCx: e. cloacae 3/18 resp cx: pseudomonas aeruginosa 3/18 BAL: Pseudomonas aeruginosa (ceftazidime sensitive), MRSA, e. faecalis 3/21 sacral cx: MRSA, pseudomonas aeruginosa 3/23 BCx: NGTD 3/27 Respiratory Cx: GNR, pending  Thank you for allowing pharmacy  to be a part of this patient's care.  Doreatha Massed, Student-PharmD 12/18/2020

## 2020-12-18 NOTE — Progress Notes (Signed)
1648 Patient complained of pain. Given prn. Otherwise a good day. Mouthed words and gestured his needs Slept off and on. Wife in to see patient.

## 2020-12-18 NOTE — Progress Notes (Signed)
Updated the patient's daughter, Shaman Muscarella, regarding patient's current status. We discussed hospital stay, current interventions and overall plan of care. Informed her that her father is stabilizing, but has very serious bacteria strains that we are actively treating. Elaborated that this might be an ongoing issue given his many available sources for infection.  All questions and concerns answered at this time.   Domingo Pulse Rust-[place]ster, AGACNP-BC Acute Care Nurse Practitioner [facility]   [phone] / [phone] Please see Amion for pager details.

## 2020-12-18 NOTE — Progress Notes (Signed)
Date of Admission:  12/07/2020   Subjective: Non verbal  due to tracheostomy but tries to mouth words  Medications:  . ascorbic acid  500 mg Per Tube BID  . baclofen  10 mg Oral QID  . chlorhexidine gluconate (MEDLINE KIT)  15 mL Mouth Rinse BID  . Chlorhexidine Gluconate Cloth  6 each Topical Daily  . diclofenac Sodium  4 g Topical QID  . feeding supplement (PROSource TF)  45 mL Per Tube Daily  . ferrous sulfate  75 mg Per Tube Daily  . fludrocortisone  200 mcg Per Tube Daily  . FLUoxetine  40 mg Per Tube Daily  . free water  100 mL Per Tube Q4H  . guaiFENesin  400 mg Per Tube Q6H  . insulin aspart  0-15 Units Subcutaneous Q4H  . ipratropium-albuterol  3 mL Nebulization TID  . mouth rinse  15 mL Mouth Rinse 10 times per day  . nutrition supplement (JUVEN)  1 packet Per Tube BID BM  . polycarbophil  625 mg Per Tube Daily  . rivaroxaban  10 mg Per Tube Daily  . saccharomyces boulardii  250 mg Per Tube BID  . sennosides  5 mL Per Tube Q0600  . simethicone  80 mg Per Tube QID  . traZODone  50 mg Per Tube QHS  . zinc sulfate  220 mg Per Tube Daily    Objective: Vital signs in last 24 hours: Temp:  [96.6 F (35.9 C)-97 F (36.1 C)] 96.6 F (35.9 C) (03/27 2000) Pulse Rate:  [33-70] 62 (03/28 1200) Resp:  [17-44] 25 (03/28 1200) BP: (92-169)/(44-94) 108/94 (03/28 1200) SpO2:  [95 %-100 %] 98 % (03/28 1200) FiO2 (%):  [25 %-30 %] 25 % (03/28 1109) Weight:  [130 kg] 130 kg (03/28 0500)  PHYSICAL EXAM:  General: awakens easily  when I call him Tracheostomy Lungs: b/l air entry Heart: s1s2 Abdomen: Soft, peg Foley  Extremities: edema legs/arms Skin: sacral decubitus Lymph: Cervical, supraclavicular normal. Neurologic: Quadriplegia  Lab Results Recent Labs    12/17/20 0444 12/17/20 0623 12/18/20 0524  WBC 8.7  --  9.2  HGB 7.2*  --  7.6*  HCT 22.7*  --  23.9*  NA  --  140 138  K  --  3.7 3.2*  CL  --  107 102  CO2  --  27 31  BUN  --  32* 31*  CREATININE   --  0.57* 0.51*    Microbiology:  Micro 12/07/2020 blood culture has methicillin-resistant staph aureus, Enterobacter cloacae and Enterococcus faecalis  12/13/2020 blood culture: No growth so far  12/08/2020 bronchoalveolar lavage more than 100,000 colonies of Pseudomonas aeruginosa Methicillin-resistant staph aureus 40,000 colonies Enterococcus faecalis 20,000.  12/07/2020 urine culture Enterobacter cloacae   Sacral wound has rare Pseudomonas aeruginosa rare methicillin-resistant staph aureus few Bacteroides and rare Candida albicans.  Likely stool contamination Studies/Results: DG Chest Port 1 View  Result Date: 12/17/2020 CLINICAL DATA:  Respiratory failure.  Hypoxia. EXAM: PORTABLE CHEST 1 VIEW COMPARISON:  12/12/2020 FINDINGS: Right arm PICC line tip is in the distal SVC. Tracheostomy tube tip remains above the carina. Unchanged cardiac enlargement. Diffuse interstitial and airspace densities are unchanged compared with the previous exam. IMPRESSION: 1. No change in aeration a lungs compared with previous exam. 2. Stable support apparatus. Electronically Signed   By: Kerby Moors M.D.   On: 12/17/2020 13:47     Assessment/Plan: Polymicrobial bacteremia with sepsis Has MRSA, E faecalis, Enterobacter cloacae in blood culture.  Source could be lungs, wound, urine.  Patient is colonized with multiple organism Patient is currently on vancomycin, cefepime and ciprofloxacin.  The latter which is given for Pseudomonas pneumonia may not be needed beyond 2 weeks. That the 2 antibiotics will be needed for 4 to 6 weeks.  Acute on chronic hypoxic respiratory failure secondary to mucous plugging.  Bilateral pulmonary infiltrates.  Treated as pneumonia due to Pseudomonas and also has MRSA and Enterococcus in the BAL.  Currently on vancomycin cefepime and ciprofloxacin.  He is having dual strep coverage for Pseudomonas because susceptibilities different from 2 different strains.  Sacral  wound is looking good but colonized with multiple organism.  Some of these are stool organism.  May benefit from a diverting colostomy to help with wound healing  Bilateral heel pressure eschars  AKI has resolved  Anemia of chronic disease  Quadriplegia following a fall and fracture of C5 with cord compression.  He has had decompressive surgery in August 2021 at [facility].  PEG in place  Neurogenic bladder has Foley catheter  Discussed the management with the care team.

## 2020-12-18 NOTE — Progress Notes (Signed)
[facility]) Hospital Liaison note:  This patient is currently enrolled in [facility] outpatient-based palliative care. Will continue to follow for disposition.  Please call with any outpatient palliative questions or concerns.  Thank you, Lorelee Market, LPN [facility] Liaison  [phone]

## 2020-12-18 NOTE — Consult Note (Signed)
[facility] for Electrolyte Monitoring and Replacement   Recent Labs: Potassium (mmol/L)  Date Value  12/18/2020 3.2 (L)   Magnesium (mg/dL)  Date Value  12/18/2020 1.9   Calcium (mg/dL)  Date Value  12/18/2020 9.2   Albumin (g/dL)  Date Value  12/07/2020 3.1 (L)  02/01/2020 4.4   Phosphorus (mg/dL)  Date Value  12/18/2020 2.5   Sodium (mmol/L)  Date Value  12/18/2020 138  02/01/2020 143    Assessment: Patient is a 70 y/o M presented with sepsis. Pt presented with complaints of recurrent UTI and hematuria. 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 Pt has chronic tracheostomy and PEG tube. PMH includes HTN and HLD. Pharmacy has been consulted to assist with electrolyte monitoring and replacement as indicated.   Scr now at baseline (~0.5): 1.87 > 1.66>1.09>0.89 > 0.7 > 0.65 > 0.51.  Nutrition: tube feeds @ 70 mL/hr, prosource tube feeds once daily   MIVF: D5 @ 30 mL/hr; stopped 3/28    3/22: Desmopressin 17mcg nasal BID; stopped 3/28  Lasix 40 mg x 1 dose on 3/27  Goal of Therapy:  Electrolytes within normal limits  Plan:  -- Na 138: free water 100 per tube q4h (600 mL/day)   -- K 3.2: NP ordered Klor-con 20 mEq x 2 doses and IV potassium 10 mEq x 4 doses  -- Follow-up electrolytes with AM labs  Doreatha Massed, Student-PharmD 12/18/2020

## 2020-12-18 NOTE — Progress Notes (Signed)
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  According to pts family last week pt noted to have sediment in his urine at home concerning for UTI.  He had a foley catheter placed 2 weeks ago and completed a 7 day course of abx therapy due to UTI.  Due to concerns of recurrent UTI pts family contacted pts PCP office last week, and heard from his PCP this week and were awaiting urine culture results.  However, pt noted to have hematuria yesterday and today along with sinus tachycardia hr 120's.  Pts daughter is a Marine scientist and due to concern of sepsis pt transported to the ER via EMS for further evaluation and treatment.    ED course Upon arrival to the ER pts O2 sats were 75-80% on RA, therefore pt placed on NRB with O2 sats initially increasing to 100%.  Pt transitioned to trach collar with initial O2 sats in the low 90's.  However, pt noted to have mucous plugging resulting in hypoxia requiring exchange of tracheostomy from a cuffless trach to a size 6 mm cuffed trach and pt placed on mechanical ventilation.  ER vital signs were: temp 101.7 rectally, sbp 60-80's, and hr 130's. Lab results revealed Na+ 153, chloride 119, glucose 147, lactic acid 2.8, wbc 2.0, hbg 11.1, and UA positive for UTI.  CXR negative and COVID-19/Influenza PCR results pending.  Sepsis protocol initiated and pt received cefepime, vancomycin, and 2L LR bolus.  Pt remained severely hypotensive requiring levophed gtt and right femoral central line placement.  PCCM team contacted for ICU admission.    12/08/20- patient is improved some he is communicative.  Wife [name] at bedside we reviewed care plan.   12/10/20- patient is improved he is down to 34mg on levophed and 30%Fio2 on ventilator, mentating well. Reviewed careplan with wife  LVaughan Bastatoday.  12/11/20 PICC 12/12/20 Mucus plug, cleared with PPV-ambu 12/17/2020 increased FiO2 requirements, increased work of breathing.  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 C5 Cervical Fracture (following a mechanical fall at home) s/p C3-6 Laminectomy and Posterior Instrumentation and Fusion 08/4  Quadriplegia  Spinal Cord Compression  Chronic Tracheostomy and PEG    Significant Hospital Events: Including procedures, antibiotic start and stop dates in addition to other pertinent events   03/17: Pt admitted to ICU with urosepsis requiring vasopressors and acute on chronic hypoxic respiratory failure secondary to mucous plugging requiring exchange of cuffless trach to size 6 mm cuffed trach to be placed on mechanical ventilation  03/17: Right femoral central line placed by ER physician  03/17: CT Abd Pelvis findings with cystitis. Correlation with urinalysis is Recommended. Mild to moderate severity bibasilar atelectasis and/or Infiltrate. Sigmoid diverticulosis. Sacral decubitus ulcer, as described above. MRI correlation is recommended, as sequelae associated with acute osteomyelitis cannot be excluded. Aortic atherosclerosis. 03/17: CT Head revealed mild chronic ischemic white matter disease. No acute intracranial abnormality seen 03/17: Vancomycin x1 dose and Cefepime x1 dose 03/17: Zosyn>>completed 03/27: On Cefepime  Interim History / Subjective:  Remains obtunded On vent +sepsis +pseudomonas infections Poor prognosis  Objective   Blood pressure 101/66, pulse 69, temperature (!) 96.6 F (  35.9 C), temperature source Axillary, resp. rate (!) 25, height 6' 5.01" (1.956 m), weight 130 kg, SpO2 99 %.    Vent Mode: PCV FiO2 (%):  [30 %] 30 % Set Rate:  [24 bmp] 24 bmp Vt Set:  [500 mL]  500 mL PEEP:  [8 cmH20] 8 cmH20 Plateau Pressure:  [21 cmH20-26 cmH20] 22 cmH20   Intake/Output Summary (Last 24 hours) at 12/18/2020 0742 Last data filed at 12/18/2020 0600 Gross per 24 hour  Intake 863.79 ml  Output 6550 ml  Net -5686.21 ml   Filed Weights   12/16/20 0448 12/17/20 0500 12/18/20 0500  Weight: 127 kg 130 kg 130 kg    REVIEW OF SYSTEMS  PATIENT IS UNABLE TO PROVIDE COMPLETE REVIEW OF SYSTEM S DUE TO SEVERE CRITICAL ILLNESS   PHYSICAL EXAMINATION:  GENERAL:critically ill appearing, +resp distress on vent, s/p trach HEAD: Normocephalic, atraumatic.  EYES: Pupils equal, round, reactive to light.  No scleral icterus.  MOUTH: Moist mucosal membrane. NECK: Supple. No thyromegaly. No nodules. No JVD.  PULMONARY: +rhonchi, +wheezing CARDIOVASCULAR: S1 and S2. Regular rate and rhythm. No murmurs, rubs, or gallops.  GASTROINTESTINAL: +PEG Soft, nontender, -distended. Positive bowel sounds.  MUSCULOSKELETAL: No swelling, clubbing, or edema.  NEUROLOGIC: obtunded SKIN:sacral spine decubitus    Labs/imaging personally reviewed   CT Abd/Pelvis CXR CT Head  UA  Resolved Hospital Problem list   N/A  Assessment & Plan:   70 yo quadriplegic with multiple mecial issues with acute hypoxic resp failure due to severe sepsis from pseudomonal infections of lungs and sacral decub   Severe ACUTE Hypoxic and Hypercapnic Respiratory Failure +sepsis and mucus plugging -continue Mechanical Ventilator support -continue Bronchodilator Therapy -Wean Fio2 and PEEP as tolerated -VAP/VENT bundle implementation S/p bronch - many GPCs , cleared bilateral airways   ACUTE DIASTOLIC CARDIAC FAILURE- -oxygen as needed -Lasix as tolerated CARDIOVASCULAR:  ECHO  1.EF 55 to 60%. LVH G2DD  2. RV size and function normal   3. The mitral valve is normal in structure. No MR   4. The aortic valve is grossly normal. No AR  A. fib with CVR  Hemodynamically stable   Septic  shock- -use vasopressors to keep MAP>65 if needed -follow ABG and LA -follow up cultures -emperic ABX -consider stress dose steroids -aggressive IV fluid Resuscitation  RENAL: Lab Results  Component Value Date   CREATININE 0.51 (L) 12/18/2020   BUN 31 (H) 12/18/2020   NA 138 12/18/2020   K 3.2 (L) 12/18/2020   CL 102 12/18/2020   CO2 31 12/18/2020    Intake/Output Summary (Last 24 hours) at 12/18/2020 0742 Last data filed at 12/18/2020 0600 Gross per 24 hour  Intake 863.79 ml  Output 6550 ml  Net -5686.21 ml   Net IO Since Admission: -17,979.43 mL [12/18/20 0742]   INFECTIOUS DISEASE -continue antibiotics as prescribed -follow up cultures Sacral spine pressure ulcer-pt previously had wound vac in place with removal   ACUTE ANEMIA- TRANSFUSE AS NEEDED DVT PRX with TED/SCD's ONLY     NEURO: Quadriplegia-bed bound Acute toxic encephalopathy  Frequent reorientation  Improving Avoid sedating medications  Best practice (evaluated daily)  Diet:  NPO Pain/Anxiety/Delirium protocol (if indicated): Yes (RASS goal 0) VAP protocol (if indicated): Yes DVT prophylaxis: Systemic AC GI prophylaxis: H2B Glucose control:  SSI No Central venous access:  Yes, and it is still needed Arterial line:  N/A Foley:  Yes, and it is still needed Mobility:  bed rest  PT consulted: N/A Last date  of multidisciplinary goals of care discussion [N/A]  Code Status:  full code Disposition: ICU    DVT/GI PRX ordered and assessed TRANSFUSIONS AS NEEDED MONITOR FSBS I Assessed the need for Labs I Assessed the need for Foley I Assessed the need for Central Venous Line Family Discussion when available I Assessed the need for Mobilization I made an Assessment of medications to be adjusted accordingly Safety Risk assessment completed  CASE DISCUSSED IN MULTIDISCIPLINARY ROUNDS WITH ICU TEAM        Labs   CBC: Recent Labs  Lab 12/13/20 0550 12/14/20 0302 12/15/20 0343  12/16/20 0450 12/17/20 0444 12/18/20 0524  WBC 12.8* 13.1* 14.6* 12.5* 8.7 9.2  NEUTROABS 9.5* 10.2* 11.6*  --   --   --   HGB 7.6* 7.0* 6.9* 7.8* 7.2* 7.6*  HCT 24.1* 22.6* 22.6* 24.8* 22.7* 23.9*  MCV 93.1 94.6 94.6 91.5 90.8 89.5  PLT 139* 134* 156 176 158 417    Basic Metabolic Panel: Recent Labs  Lab 12/14/20 0302 12/15/20 0343 12/16/20 0450 12/17/20 0623 12/18/20 0524  NA 148* 145 144 140 138  K 3.4* 3.4* 3.4* 3.7 3.2*  CL 117* 113* 110 107 102  CO2 '27 28 29 27 31  ' GLUCOSE 187* 160* 192* 169* 113*  BUN 36* 35* 32* 32* 31*  CREATININE 0.65 0.62 0.53* 0.57* 0.51*  CALCIUM 9.0 9.0 9.0 9.1 9.2  MG 2.0 1.9 1.8 2.0 1.9  PHOS 2.2* 3.2 2.3* 2.5 2.5   GFR: Estimated Creatinine Clearance: 131.9 mL/min (A) (by C-G formula based on SCr of 0.51 mg/dL (L)). Recent Labs  Lab 12/13/20 0550 12/14/20 0302 12/15/20 0343 12/16/20 0450 12/17/20 0444 12/18/20 0524  PROCALCITON 32.49  --   --   --   --   --   WBC 12.8*   < > 14.6* 12.5* 8.7 9.2   < > = values in this interval not displayed.    Liver Function Tests: No results for input(s): AST, ALT, ALKPHOS, BILITOT, PROT, ALBUMIN in the last 168 hours. No results for input(s): LIPASE, AMYLASE in the last 168 hours. No results for input(s): AMMONIA in the last 168 hours.  ABG    Component Value Date/Time   PHART 7.38 12/08/2020 0500   PCO2ART 42 12/08/2020 0500   PO2ART 115 (H) 12/08/2020 0500   HCO3 24.8 12/08/2020 0500   ACIDBASEDEF 0.4 12/08/2020 0500   O2SAT 98.4 12/08/2020 0500     Coagulation Profile: No results for input(s): INR, PROTIME in the last 168 hours.  Cardiac Enzymes: No results for input(s): CKTOTAL, CKMB, CKMBINDEX, TROPONINI in the last 168 hours.  HbA1C: Hgb A1c MFr Bld  Date/Time Value Ref Range Status  06/09/2020 07:00 AM 5.8 (H) 4.8 - 5.6 % Final    Comment:    (NOTE) Pre diabetes:          5.7%-6.4%  Diabetes:              >6.4%  Glycemic control for   <7.0% adults with diabetes      CBG: Recent Labs  Lab 12/17/20 1126 12/17/20 1548 12/17/20 1916 12/17/20 2308 12/18/20 0330  GLUCAP 123* 171* 159* 149* 126*    Review of Systems:   Unable to assess pt chronically trached currently requiring mechanical ventilation   Allergies Allergies  Allergen Reactions  . Shrimp (Diagnostic)     Experiences facial droop when eating seafood     Scheduled Meds: . ascorbic acid  500 mg Per Tube BID  .  baclofen  10 mg Oral QID  . chlorhexidine gluconate (MEDLINE KIT)  15 mL Mouth Rinse BID  . Chlorhexidine Gluconate Cloth  6 each Topical Daily  . desmopressin  10 mcg Nasal BID  . diclofenac Sodium  4 g Topical QID  . feeding supplement (PROSource TF)  45 mL Per Tube Daily  . ferrous sulfate  75 mg Per Tube Daily  . fludrocortisone  200 mcg Per Tube Daily  . FLUoxetine  40 mg Per Tube Daily  . free water  30 mL Per Tube Q2H  . guaiFENesin  400 mg Per Tube Q6H  . insulin aspart  0-15 Units Subcutaneous Q4H  . ipratropium-albuterol  3 mL Nebulization TID  . mouth rinse  15 mL Mouth Rinse 10 times per day  . nutrition supplement (JUVEN)  1 packet Per Tube BID BM  . polycarbophil  625 mg Per Tube Daily  . potassium chloride  20 mEq Per Tube Q4H  . rivaroxaban  10 mg Per Tube Daily  . saccharomyces boulardii  250 mg Per Tube BID  . sennosides  5 mL Per Tube Q0600  . simethicone  80 mg Per Tube QID  . traZODone  50 mg Per Tube QHS  . zinc sulfate  220 mg Per Tube Daily   Continuous Infusions: . ceFEPime (MAXIPIME) IV 2 g (12/18/20 9198)  . ciprofloxacin 400 mg (12/18/20 0539)  . dextrose 30 mL/hr at 12/17/20 2115  . famotidine (PEPCID) IV Stopped (12/17/20 2138)  . feeding supplement (PIVOT 1.5 CAL) 1,000 mL (12/18/20 0522)  . potassium chloride    . vancomycin 750 mg (12/18/20 0308)   PRN Meds:.acetaminophen, bisacodyl, docusate sodium, fentaNYL (SUBLIMAZE) injection, midazolam, ondansetron (ZOFRAN) IV, polyethylene glycol, sodium chloride flush    Critical  Care Time devoted to patient care services described in this note is 40mnutes.   Overall, patient is critically ill, prognosis is guarded.  Patient with Multiorgan failure and at high risk for cardiac arrest and death.    KCorrin Parker M.D.  LVelora HecklerPulmonary & Critical Care Medicine  Medical Director ICorwin SpringsDirector ASouth [facility] LL[facility]ardio-Pulmonary Department

## 2020-12-19 DIAGNOSIS — G8250 Quadriplegia, unspecified: Secondary | ICD-10-CM | POA: Diagnosis not present

## 2020-12-19 DIAGNOSIS — A419 Sepsis, unspecified organism: Secondary | ICD-10-CM | POA: Diagnosis not present

## 2020-12-19 DIAGNOSIS — R6521 Severe sepsis with septic shock: Secondary | ICD-10-CM | POA: Diagnosis not present

## 2020-12-19 DIAGNOSIS — J9601 Acute respiratory failure with hypoxia: Secondary | ICD-10-CM | POA: Diagnosis not present

## 2020-12-19 LAB — GLUCOSE, CAPILLARY
Glucose-Capillary: 121 mg/dL — ABNORMAL HIGH (ref 70–99)
Glucose-Capillary: 127 mg/dL — ABNORMAL HIGH (ref 70–99)
Glucose-Capillary: 131 mg/dL — ABNORMAL HIGH (ref 70–99)
Glucose-Capillary: 145 mg/dL — ABNORMAL HIGH (ref 70–99)
Glucose-Capillary: 146 mg/dL — ABNORMAL HIGH (ref 70–99)
Glucose-Capillary: 149 mg/dL — ABNORMAL HIGH (ref 70–99)
Glucose-Capillary: 174 mg/dL — ABNORMAL HIGH (ref 70–99)

## 2020-12-19 LAB — MAGNESIUM: Magnesium: 2.1 mg/dL (ref 1.7–2.4)

## 2020-12-19 LAB — PHOSPHORUS: Phosphorus: 1.9 mg/dL — ABNORMAL LOW (ref 2.5–4.6)

## 2020-12-19 LAB — CBC
HCT: 25.2 % — ABNORMAL LOW (ref 39.0–52.0)
Hemoglobin: 8 g/dL — ABNORMAL LOW (ref 13.0–17.0)
MCH: 28.2 pg (ref 26.0–34.0)
MCHC: 31.7 g/dL (ref 30.0–36.0)
MCV: 88.7 fL (ref 80.0–100.0)
Platelets: 263 10*3/uL (ref 150–400)
RBC: 2.84 MIL/uL — ABNORMAL LOW (ref 4.22–5.81)
RDW: 16.1 % — ABNORMAL HIGH (ref 11.5–15.5)
WBC: 8 10*3/uL (ref 4.0–10.5)
nRBC: 0.4 % — ABNORMAL HIGH (ref 0.0–0.2)

## 2020-12-19 LAB — BASIC METABOLIC PANEL
Anion gap: 6 (ref 5–15)
BUN: 34 mg/dL — ABNORMAL HIGH (ref 8–23)
CO2: 29 mmol/L (ref 22–32)
Calcium: 9.3 mg/dL (ref 8.9–10.3)
Chloride: 104 mmol/L (ref 98–111)
Creatinine, Ser: 0.56 mg/dL — ABNORMAL LOW (ref 0.61–1.24)
GFR, Estimated: >60 mL/min (ref >60)
Glucose, Bld: 138 mg/dL — ABNORMAL HIGH (ref 70–99)
Potassium: 3.5 mmol/L (ref 3.5–5.1)
Sodium: 139 mmol/L (ref 135–145)

## 2020-12-19 LAB — VANCOMYCIN, TROUGH: Vancomycin Tr: 23 ug/mL (ref 15–20)

## 2020-12-19 LAB — VANCOMYCIN, PEAK: Vancomycin Pk: 28 ug/mL — ABNORMAL LOW (ref 30–40)

## 2020-12-19 MED ORDER — POTASSIUM & SODIUM PHOSPHATES 280-160-250 MG PO PACK
2.0000 | PACK | ORAL | Status: AC
  Administered 2020-12-19 (×2): 2
  Filled 2020-12-19 (×2): qty 2

## 2020-12-19 MED ORDER — ALTEPLASE 2 MG IJ SOLR
2.0000 mg | Freq: Once | INTRAMUSCULAR | Status: AC
  Administered 2020-12-19: 2 mg
  Filled 2020-12-19: qty 2

## 2020-12-19 MED ORDER — STERILE WATER FOR INJECTION IJ SOLN
INTRAMUSCULAR | Status: AC
  Administered 2020-12-19: 10 mL
  Filled 2020-12-19: qty 10

## 2020-12-19 NOTE — Consult Note (Signed)
[place] for Electrolyte Monitoring and Replacement   Recent Labs: Potassium (mmol/L)  Date Value  12/19/2020 3.5   Magnesium (mg/dL)  Date Value  12/19/2020 2.1   Calcium (mg/dL)  Date Value  12/19/2020 9.3   Albumin (g/dL)  Date Value  12/07/2020 3.1 (L)  02/01/2020 4.4   Phosphorus (mg/dL)  Date Value  12/19/2020 1.9 (L)   Sodium (mmol/L)  Date Value  12/19/2020 139  02/01/2020 143    Assessment: Patient is a 70 y/o M presented with sepsis. Pt presented with complaints of recurrent UTI and hematuria. 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 Pt has chronic tracheostomy and PEG tube. PMH includes HTN and HLD. Pharmacy has been consulted to assist with electrolyte monitoring and replacement as indicated.   Scr now around baseline (~0.5): 1.87 > 1.66>1.09>0.89 > 0.7 > 0.65 > 0.51 > 0.56  Nutrition: tube feeds @ 70 mL/hr, prosource tube feeds once daily   MIVF: Free water at 100 mL Q4h  Goal of Therapy:  Electrolytes within normal limits  Plan:  -- Na 139: continue free water 100 per tube q4h (600 mL/day)   -- Phos 1.9: 2 phos-NaK packets q4h x 2 doses     -- Follow-up electrolytes with AM labs  Doreatha Massed, Student-PharmD 12/19/2020

## 2020-12-19 NOTE — Plan of Care (Signed)
No significant events overnight. See epic for charting  Problem: Education: Goal: Knowledge of General Education information will improve Description: Including pain rating scale, medication(s)/side effects and non-pharmacologic comfort measures Outcome: Progressing    Problem: Clinical Measurements: Goal: Ability to maintain clinical measurements within normal limits will improve Outcome: Progressing Goal: Will remain free from infection Outcome: Progressing Goal: Diagnostic test results will improve Outcome: Progressing Goal: Respiratory complications will improve Outcome: Progressing Goal: Cardiovascular complication will be avoided Outcome: Progressing   Problem: Activity: Goal: Risk for activity intolerance will decrease Outcome: Progressing   Problem: Nutrition: Goal: Adequate nutrition will be maintained Outcome: Progressing   Problem: Safety: Goal: Ability to remain free from injury will improve Outcome: Progressing   Problem: Pain Managment: Goal: General experience of comfort will improve Outcome: Progressing   Problem: Skin Integrity: Goal: Risk for impaired skin integrity will decrease Outcome: Progressing

## 2020-12-19 NOTE — Progress Notes (Signed)
GOALS OF CARE DISCUSSION  The Clinical status was relayed to family in detail. Wife at bedside  Updated and notified of patients medical condition.  Patient is having a weak cough and struggling to remove secretions.   Patient with increased WOB and using accessory muscles to breathe Explained to family course of therapy and the modalities   Family understands the situation.  Patient remains full code We are providing vent support as needed until infections subside.  Family are satisfied with Plan of action and management. All questions answered  Additional CC time 32 mins   Jatavian Calica Patricia Pesa, M.D.  [facility] Critical Care Medicine  Medical Director [place] Director [facility] Cardio-Pulmonary Department

## 2020-12-19 NOTE — Progress Notes (Addendum)
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  According to pts family last week pt noted to have sediment in his urine at home concerning for UTI.  He had a foley catheter placed 2 weeks ago and completed a 7 day course of abx therapy due to UTI.  Due to concerns of recurrent UTI pts family contacted pts PCP office last week, and heard from his PCP this week and were awaiting urine culture results.  However, pt noted to have hematuria yesterday and today along with sinus tachycardia hr 120's.  Pts daughter is a Marine scientist and due to concern of sepsis pt transported to the ER via EMS for further evaluation and treatment.    ED course Upon arrival to the ER pts O2 sats were 75-80% on RA, therefore pt placed on NRB with O2 sats initially increasing to 100%.  Pt transitioned to trach collar with initial O2 sats in the low 90's.  However, pt noted to have mucous plugging resulting in hypoxia requiring exchange of tracheostomy from a cuffless trach to a size 6 mm cuffed trach and pt placed on mechanical ventilation.  ER vital signs were: temp 101.7 rectally, sbp 60-80's, and hr 130's. Lab results revealed Na+ 153, chloride 119, glucose 147, lactic acid 2.8, wbc 2.0, hbg 11.1, and UA positive for UTI.  CXR negative and COVID-19/Influenza PCR results pending.  Sepsis protocol initiated and pt received cefepime, vancomycin, and 2L LR bolus.  Pt remained severely hypotensive requiring levophed gtt and right femoral central line placement.  PCCM team contacted for ICU admission.    12/08/20- patient is improved some he is communicative.  Wife [name] at bedside we reviewed care plan.   12/10/20- patient is improved he is down to 62mg on levophed and 30%Fio2 on ventilator, mentating well. Reviewed careplan with wife  LVaughan Bastatoday.  12/11/20 PICC 12/12/20 Mucus plug, cleared with PPV-ambu 12/17/2020 increased FiO2 requirements, increased work of breathing.  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 C5 Cervical Fracture (following a mechanical fall at home) s/p C3-6 Laminectomy and Posterior Instrumentation and Fusion 08/4  Quadriplegia  Spinal Cord Compression  Chronic Tracheostomy and PEG    Significant Hospital Events: Including procedures, antibiotic start and stop dates in addition to other pertinent events   03/17: Pt admitted to ICU with urosepsis requiring vasopressors and acute on chronic hypoxic respiratory failure secondary to mucous plugging requiring exchange of cuffless trach to size 6 mm cuffed trach to be placed on mechanical ventilation  03/17: Right femoral central line placed by ER physician  03/17: CT Abd Pelvis findings with cystitis. Correlation with urinalysis is Recommended. Mild to moderate severity bibasilar atelectasis and/or Infiltrate. Sigmoid diverticulosis. Sacral decubitus ulcer, as described above. MRI correlation is recommended, as sequelae associated with acute osteomyelitis cannot be excluded. Aortic atherosclerosis. 03/17: CT Head revealed mild chronic ischemic white matter disease. No acute intracranial abnormality seen 03/17: Vancomycin x1 dose and Cefepime x1 dose 03/17: Zosyn>>completed 03/27: On Cefepime  Micro 12/07/2020 blood culture has methicillin-resistant staph aureus, Enterobacter cloacae and Enterococcus faecalis  12/13/2020 blood culture: No  growth so far  12/08/2020  bronchoalveolar lavage more than 100,000 colonies of Pseudomonas aeruginosa Methicillin-resistant staph aureus 40,000 colonies Enterococcus  faecalis 20,000.  12/07/2020 urine culture Enterobacter cloacae   Sacral wound has rare Pseudomonas aeruginosa rare methicillin-resistant staph aureus few Bacteroides and rare Candida albicans. Likely stool contamination   Anti-infectives (From admission, onward)   Start     Dose/Rate Route Frequency Ordered Stop   12/17/20 1400  vancomycin (VANCOREADY) IVPB 750 mg/150 mL        750 mg 150 mL/hr over 60 Minutes Intravenous Every 12 hours 12/17/20 1159     12/13/20 1000  vancomycin (VANCOREADY) IVPB 1000 mg/200 mL  Status:  Discontinued        1,000 mg 200 mL/hr over 60 Minutes Intravenous Every 12 hours 12/13/20 0832 12/17/20 1159   12/12/20 1800  ciprofloxacin (CIPRO) IVPB 400 mg        400 mg 200 mL/hr over 60 Minutes Intravenous Every 8 hours 12/12/20 1620     12/12/20 1700  ciprofloxacin (CIPRO) IVPB 400 mg  Status:  Discontinued        400 mg 200 mL/hr over 60 Minutes Intravenous Every 12 hours 12/12/20 1609 12/12/20 1620   12/12/20 1615  vancomycin variable dose per unstable renal function (pharmacist dosing)  Status:  Discontinued         Does not apply See admin instructions 12/12/20 1615 12/13/20 1050   12/12/20 1100  vancomycin (VANCOREADY) IVPB 1750 mg/350 mL        1,750 mg 175 mL/hr over 120 Minutes Intravenous  Once 12/12/20 0957 12/12/20 1301   12/12/20 0600  ceFEPIme (MAXIPIME) 2 g in sodium chloride 0.9 % 100 mL IVPB        2 g 200 mL/hr over 30 Minutes Intravenous Every 8 hours 12/11/20 1929     12/11/20 1522  vancomycin variable dose per unstable renal function (pharmacist dosing)  Status:  Discontinued         Does not apply See admin instructions 12/11/20 1522 12/12/20 1607   12/11/20 0900  vancomycin (VANCOREADY) IVPB 1000 mg/200 mL  Status:  Discontinued        1,000 mg 200 mL/hr over 60 Minutes Intravenous Every 12 hours 12/11/20 0813 12/11/20 1522   12/10/20 1515  meropenem (MERREM) 1 g in sodium chloride 0.9 % 100 mL IVPB        1 g 200 mL/hr over 30  Minutes Intravenous Every 8 hours 12/10/20 1418 12/12/20 0834   12/09/20 1000  ceFEPIme (MAXIPIME) 2 g in sodium chloride 0.9 % 100 mL IVPB  Status:  Discontinued        2 g 200 mL/hr over 30 Minutes Intravenous Every 8 hours 12/09/20 0811 12/10/20 1418   12/09/20 0730  vancomycin (VANCOREADY) IVPB 2000 mg/400 mL        2,000 mg 200 mL/hr over 120 Minutes Intravenous  Once 12/09/20 0644 12/09/20 1616   12/09/20 0600  vancomycin (VANCOREADY) IVPB 1750 mg/350 mL  Status:  Discontinued        1,750 mg 175 mL/hr over 120 Minutes Intravenous Every 24 hours 12/08/20 0809 12/08/20 1229   12/08/20 2200  ceFEPIme (MAXIPIME) 2 g in sodium chloride 0.9 % 100 mL IVPB  Status:  Discontinued        2 g 200 mL/hr over 30 Minutes Intravenous Every 12 hours 12/08/20 0809 12/09/20 0811   12/08/20 1700  vancomycin (VANCOREADY) IVPB 1500 mg/300 mL  Status:  Discontinued  1,500 mg 150 mL/hr over 120 Minutes Intravenous Every 12 hours 12/08/20 0500 12/08/20 0809   12/08/20 1230  vancomycin variable dose per unstable renal function (pharmacist dosing)  Status:  Discontinued         Does not apply See admin instructions 12/08/20 1230 12/11/20 1359   12/08/20 0545  ceFEPIme (MAXIPIME) 2 g in sodium chloride 0.9 % 100 mL IVPB  Status:  Discontinued        2 g 200 mL/hr over 30 Minutes Intravenous Every 8 hours 12/08/20 0446 12/08/20 0809   12/08/20 0530  vancomycin (VANCOREADY) IVPB 2000 mg/400 mL       "Followed by" Linked Group Details   2,000 mg 200 mL/hr over 120 Minutes Intravenous  Once 12/08/20 0441 12/08/20 0815   12/08/20 0530  vancomycin (VANCOREADY) IVPB 500 mg/100 mL  Status:  Discontinued       "Followed by" Linked Group Details   500 mg 100 mL/hr over 60 Minutes Intravenous  Once 12/08/20 0441 12/08/20 0803   12/07/20 2215  piperacillin-tazobactam (ZOSYN) IVPB 3.375 g  Status:  Discontinued        3.375 g 12.5 mL/hr over 240 Minutes Intravenous Every 8 hours 12/07/20 2124 12/08/20 0447    12/07/20 1800  ceFEPIme (MAXIPIME) 2 g in sodium chloride 0.9 % 100 mL IVPB        2 g 200 mL/hr over 30 Minutes Intravenous  Once 12/07/20 1757 12/07/20 2036   12/07/20 1800  vancomycin (VANCOCIN) IVPB 1000 mg/200 mL premix        1,000 mg 200 mL/hr over 60 Minutes Intravenous  Once 12/07/20 1757 12/07/20 1942       Interim History / Subjective:  Alert and awake On vent PS mode 14/8 Thick secretions from trach increased WOBRemains obtunded +pseudomonas infections   Objective   Blood pressure 134/77, pulse 73, temperature (!) 97.1 F (36.2 C), temperature source Axillary, resp. rate (!) 21, height 6' 5.01" (1.956 m), weight 130 kg, SpO2 95 %.    Vent Mode: PSV FiO2 (%):  [21 %-25 %] 21 % PEEP:  [8 cmH20] 8 cmH20 Pressure Support:  [14 cmH20] 14 cmH20   Intake/Output Summary (Last 24 hours) at 12/19/2020 0725 Last data filed at 12/19/2020 0515 Gross per 24 hour  Intake 586.35 ml  Output 4800 ml  Net -4213.65 ml   Filed Weights   12/17/20 0500 12/18/20 0500 12/19/20 0500  Weight: 130 kg 130 kg 130 kg    REVIEW OF SYSTEMS  PATIENT IS UNABLE TO PROVIDE COMPLETE REVIEW OF SYSTEMS DUE TO SEVERE CRITICAL ILLNESS    PHYSICAL EXAMINATION:  GENERAL:critically ill appearing, +resp distress HEAD: Normocephalic, atraumatic.  EYES: Pupils equal, round, reactive to light.  No scleral icterus.  MOUTH: Moist mucosal membrane. NECK: Supple. No thyromegaly. No nodules. No JVD.  PULMONARY: +rhonchi, +wheezing CARDIOVASCULAR: S1 and S2. Regular rate and rhythm. No murmurs, rubs, or gallops.  GASTROINTESTINAL: Soft, nontender, -distended. Positive bowel sounds.  MUSCULOSKELETAL: No swelling, clubbing, or edema.  NEUROLOGIC: awake SKIN:sacral spine decubitus     Labs/imaging personally reviewed   CT Abd/Pelvis CXR CT Head  UA  Resolved Hospital Problem list   N/A  Assessment & Plan:   70 yo quadriplegic with multiple mecial issues with acute hypoxic resp failure due to  severe sepsis from pseudomonal infections of lungs and sacral decub  Severe ACUTE Hypoxic and Hypercapnic Respiratory Failure -continue Mechanical Ventilator support -continue Bronchodilator Therapy -Wean Fio2 and PEEP as tolerated -VAP/VENT bundle implementation -  will perform SAT/SBT when respiratory parameters are met +sepsis and mucus plugging S/p bronch - many GPCs , cleared bilateral airways     ACUTE DIASTOLIC CARDIAC FAILURE/Afob with RVR -oxygen as needed -Lasix as tolerated  Septic shock -use vasopressors to keep MAP>65 as needed -follow ABG and LA -follow up cultures  INFECTIOUS DISEASE -continue antibiotics as prescribed -follow up cultures -follow up ID consultation   ACUTE KIDNEY INJURY/Renal Failure -continue Foley Catheter-assess need -Avoid nephrotoxic agents -Follow urine output, BMP -Ensure adequate renal perfusion, optimize oxygenation -Renal dose medications    Intake/Output Summary (Last 24 hours) at 12/19/2020 9390 Last data filed at 12/19/2020 0515 Gross per 24 hour  Intake 586.35 ml  Output 4800 ml  Net -4213.65 ml    ACUTE ANEMIA- TRANSFUSE AS NEEDED DVT PRX with TED/SCD's ONLY   NEUROLOGY Quadriplegia-bed bound Frequent reorientation  Improving Avoid sedating medications     Best practice (evaluated daily)  Diet:  NPO Pain/Anxiety/Delirium protocol (if indicated): Yes (RASS goal 0) VAP protocol (if indicated): Yes DVT prophylaxis: Systemic AC GI prophylaxis: H2B Glucose control:  SSI No Central venous access:  Yes, and it is still needed Arterial line:  N/A Foley:  Yes, and it is still needed Mobility:  bed rest  PT consulted: N/A Last date of multidisciplinary goals of care discussion [N/A]  Code Status:  full code Disposition: ICU     DVT/GI PRX ordered and assessed TRANSFUSIONS AS NEEDED MONITOR FSBS I Assessed the need for Labs I Assessed the need for Foley I Assessed the need for Central Venous Line Family  Discussion when available I Assessed the need for Mobilization I made an Assessment of medications to be adjusted accordingly Safety Risk assessment completed  CASE DISCUSSED IN MULTIDISCIPLINARY ROUNDS WITH ICU TEAM          Labs   CBC: Recent Labs  Lab 12/13/20 0550 12/14/20 0302 12/15/20 0343 12/16/20 0450 12/17/20 0444 12/18/20 0524 12/19/20 0515  WBC 12.8* 13.1* 14.6* 12.5* 8.7 9.2 8.0  NEUTROABS 9.5* 10.2* 11.6*  --   --   --   --   HGB 7.6* 7.0* 6.9* 7.8* 7.2* 7.6* 8.0*  HCT 24.1* 22.6* 22.6* 24.8* 22.7* 23.9* 25.2*  MCV 93.1 94.6 94.6 91.5 90.8 89.5 88.7  PLT 139* 134* 156 176 158 211 300    Basic Metabolic Panel: Recent Labs  Lab 12/15/20 0343 12/16/20 0450 12/17/20 0623 12/18/20 0524 12/19/20 0515  NA 145 144 140 138 139  K 3.4* 3.4* 3.7 3.2* 3.5  CL 113* 110 107 102 104  CO2 _0 GLUCOSE 160* 192* 169* 113* 138*  BUN 35* 32* 32* 31* 34*  CREATININE 0.62 0.53* 0.57* 0.51* 0.56*  CALCIUM 9.0 9.0 9.1 9.2 9.3  MG 1.9 1.8 2.0 1.9 2.1  PHOS 3.2 2.3* 2.5 2.5 1.9*   GFR: Estimated Creatinine Clearance: 131.9 mL/min (A) (by C-G formula based on SCr of 0.56 mg/dL (L)). Recent Labs  Lab 12/13/20 0550 12/14/20 0302 12/16/20 0450 12/17/20 0444 12/18/20 0524 12/19/20 0515  PROCALCITON 32.49  --   --   --   --   --   WBC 12.8*   < > 12.5* 8.7 9.2 8.0   < > = values in this interval not displayed.    Liver Function Tests: No results for input(s): AST, ALT, ALKPHOS, BILITOT, PROT, ALBUMIN in the last 168 hours. No results for input(s): LIPASE, AMYLASE in the last 168 hours. No results for input(s): AMMONIA in  the last 168 hours.  ABG    Component Value Date/Time   PHART 7.38 12/08/2020 0500   PCO2ART 42 12/08/2020 0500   PO2ART 115 (H) 12/08/2020 0500   HCO3 24.8 12/08/2020 0500   ACIDBASEDEF 0.4 12/08/2020 0500   O2SAT 98.4 12/08/2020 0500     Coagulation Profile: No results for input(s): INR, PROTIME in the last 168  hours.  Cardiac Enzymes: No results for input(s): CKTOTAL, CKMB, CKMBINDEX, TROPONINI in the last 168 hours.  HbA1C: Hgb A1c MFr Bld  Date/Time Value Ref Range Status  06/09/2020 07:00 AM 5.8 (H) 4.8 - 5.6 % Final    Comment:    (NOTE) Pre diabetes:          5.7%-6.4%  Diabetes:              >6.4%  Glycemic control for   <7.0% adults with diabetes     CBG: Recent Labs  Lab 12/18/20 1101 12/18/20 1631 12/18/20 1940 12/19/20 0002 12/19/20 0409  GLUCAP 134* 136* 163* 174* 121*    Review of Systems:   Unable to assess pt chronically trached currently requiring mechanical ventilation   Allergies Allergies  Allergen Reactions  . Shrimp (Diagnostic)     Experiences facial droop when eating seafood     Scheduled Meds: . ascorbic acid  500 mg Per Tube BID  . baclofen  10 mg Oral QID  . chlorhexidine gluconate (MEDLINE KIT)  15 mL Mouth Rinse BID  . Chlorhexidine Gluconate Cloth  6 each Topical Daily  . diclofenac Sodium  4 g Topical QID  . feeding supplement (PROSource TF)  45 mL Per Tube Daily  . ferrous sulfate  75 mg Per Tube Daily  . fludrocortisone  200 mcg Per Tube Daily  . FLUoxetine  40 mg Per Tube Daily  . free water  100 mL Per Tube Q4H  . guaiFENesin  400 mg Per Tube Q6H  . insulin aspart  0-15 Units Subcutaneous Q4H  . ipratropium-albuterol  3 mL Nebulization TID  . mouth rinse  15 mL Mouth Rinse 10 times per day  . nutrition supplement (JUVEN)  1 packet Per Tube BID BM  . polycarbophil  625 mg Per Tube Daily  . rivaroxaban  10 mg Per Tube Daily  . saccharomyces boulardii  250 mg Per Tube BID  . sennosides  5 mL Per Tube Q0600  . simethicone  80 mg Per Tube QID  . traZODone  50 mg Per Tube QHS  . zinc sulfate  220 mg Per Tube Daily   Continuous Infusions: . ceFEPime (MAXIPIME) IV 2 g (12/19/20 0515)  . ciprofloxacin 400 mg (12/19/20 0615)  . famotidine (PEPCID) IV 20 mg (12/19/20 0023)  . feeding supplement (PIVOT 1.5 CAL) 1,000 mL (12/18/20  0522)  . vancomycin 750 mg (12/19/20 0204)   PRN Meds:.acetaminophen, bisacodyl, docusate sodium, fentaNYL (SUBLIMAZE) injection, midazolam, ondansetron (ZOFRAN) IV, polyethylene glycol, sodium chloride flush     Critical Care Time devoted to patient care services described in this note is 55 minutes.   Overall, patient is critically ill, prognosis is guarded.    Corrin Parker, M.D.  [facility]  Medical Director [place] Director [name] Cardio-Pulmonary Department

## 2020-12-19 NOTE — Progress Notes (Addendum)
Pharmacy Antibiotic Note  Ryan Day is a 70 y.o. male admitted on 12/07/2020 with sepsis. Pt presented with complaints of recurrent UTI and hematuria with foley cath last placed ~2 weeks ago, exchanged in ED. 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 Pt has PEG and trache. Pt admitted 11/22-12/5 for HCAP and sacral ulcer with osteomyelitis. Hx of enterobacter cloacae, pseudomonas in ucx. Pt has received vanc, Unasyn, Zosyn, and cefepime on recent previous admissions. ID consulted. Pt with polymicrobial bacteremia with sepsis resulting from multiple sources including UTI, PNA, and sacral wound. Meropenem was discontinued 3/22 based on sensitivity to ceftazidime from BAL. IV Cipro was added due to increased secretions. Pharmacy has been consulted for vancomycin dosing.   Day 14 abx, day 8 cipro  Plan: Ciprofloxacin 400 mg q8h   Cefepime 2 grams q8h   Vancomycin 750 Q12h. Goal AUC 400-550. --Expected AUC: 442, Cmin 13.5 --Vancomycin peak 3/29 at 0625 = 28 (drawn 3 hours after completion of infusion) --Vancomycin trough 3/29 at 1300 = 23 --Comment: Vancomycin infusion had been started and running for approximately 5 minutes before noted that trough was not collected. Infusion was stopped and level collected. Vancomycin trough is erroneous --Continue vancomycin with current dosing regimen. Will re-check levels again tomorrow  Height: 6' 5.01" (195.6 cm) Weight: 130 kg (286 lb 9.6 oz) IBW/kg (Calculated) : 89.12   Temp (24hrs), Avg:97.2 F (36.2 C), Min:96.8 F (36 C), Max:97.6 F (36.4 C)  Recent Labs  Lab 12/15/20 0343 12/16/20 0450 12/16/20 0949 12/16/20 1604 12/17/20 0130 12/17/20 0444 12/17/20 0623 12/18/20 0524 12/19/20 0515 12/19/20 0625 12/19/20 1300  WBC 14.6* 12.5*  --   --   --  8.7  --  9.2 8.0  --   --   CREATININE 0.62 0.53*  --   --   --   --  0.57* 0.51* 0.56*  --   --   VANCOTROUGH  --   --    < >  --  18  --   --   --   --    --  23*  VANCOPEAK  --   --   --  30  --   --   --   --   --  28*  --    < > = values in this interval not displayed.    Estimated Creatinine Clearance: 131.9 mL/min (A) (by C-G formula based on SCr of 0.56 mg/dL (L)).    Allergies  Allergen Reactions  . Shrimp (Diagnostic)     Experiences facial droop when eating seafood    Antimicrobials this admission: 3/17 Vancomycin  >>  3/17 Cefepime  >> 3/20; 3/22 >> 3/17 Zosyn x 1  3/20 Meropenem >> 3/21  3/22 Cipro >>  Dose adjustments this admission: 3/27 Vanc 1 gm q12h to 750 q12h Vanc pk 3/26 @ 1604: 27 mcg/ml  Vanc trough 3/27 @ 0130= 18 mcg/ml Ke 0.0542  t1/2 12.8    Microbiology results: 3/17 MRSA PCR: positive 3/17 BCx: E cloacae, MRSA, E faecalis 3/17 UCx: E cloacae 3/18 Resp cx: PSA 3/18 BAL: PSA (ceftazidime sensitive), MRSA, E faecalis 3/21 Sacral cx: MRSA, PSA 3/23 BCx: NGTD 3/27 Respiratory Cx: PSA, pending  Thank you for allowing pharmacy to be a part of this patient's care.  Benita Gutter 12/19/2020

## 2020-12-20 ENCOUNTER — Inpatient Hospital Stay: Payer: Medicare Other

## 2020-12-20 ENCOUNTER — Ambulatory Visit: Payer: BC Managed Care – PPO | Admitting: Family Medicine

## 2020-12-20 ENCOUNTER — Encounter
Admission: EM | Disposition: A | Discharge status: Rehabilitation Facility | Payer: Self-pay | Source: Home / Self Care | Attending: Internal Medicine

## 2020-12-20 DIAGNOSIS — R7881 Bacteremia: Secondary | ICD-10-CM | POA: Diagnosis not present

## 2020-12-20 DIAGNOSIS — D638 Anemia in other chronic diseases classified elsewhere: Secondary | ICD-10-CM

## 2020-12-20 DIAGNOSIS — J9601 Acute respiratory failure with hypoxia: Secondary | ICD-10-CM | POA: Diagnosis not present

## 2020-12-20 DIAGNOSIS — A419 Sepsis, unspecified organism: Secondary | ICD-10-CM | POA: Diagnosis not present

## 2020-12-20 DIAGNOSIS — L89154 Pressure ulcer of sacral region, stage 4: Secondary | ICD-10-CM | POA: Diagnosis not present

## 2020-12-20 DIAGNOSIS — J9621 Acute and chronic respiratory failure with hypoxia: Secondary | ICD-10-CM | POA: Diagnosis not present

## 2020-12-20 DIAGNOSIS — R6521 Severe sepsis with septic shock: Secondary | ICD-10-CM | POA: Diagnosis not present

## 2020-12-20 LAB — BASIC METABOLIC PANEL
Anion gap: 4 — ABNORMAL LOW (ref 5–15)
BUN: 32 mg/dL — ABNORMAL HIGH (ref 8–23)
CO2: 30 mmol/L (ref 22–32)
Calcium: 9.3 mg/dL (ref 8.9–10.3)
Chloride: 108 mmol/L (ref 98–111)
Creatinine, Ser: 0.55 mg/dL — ABNORMAL LOW (ref 0.61–1.24)
GFR, Estimated: >60 mL/min (ref >60)
Glucose, Bld: 146 mg/dL — ABNORMAL HIGH (ref 70–99)
Potassium: 3.4 mmol/L — ABNORMAL LOW (ref 3.5–5.1)
Sodium: 142 mmol/L (ref 135–145)

## 2020-12-20 LAB — GLUCOSE, CAPILLARY
Glucose-Capillary: 133 mg/dL — ABNORMAL HIGH (ref 70–99)
Glucose-Capillary: 137 mg/dL — ABNORMAL HIGH (ref 70–99)
Glucose-Capillary: 132 mg/dL — ABNORMAL HIGH (ref 70–99)
Glucose-Capillary: 145 mg/dL — ABNORMAL HIGH (ref 70–99)
Glucose-Capillary: 135 mg/dL — ABNORMAL HIGH (ref 70–99)
Glucose-Capillary: 137 mg/dL — ABNORMAL HIGH (ref 70–99)

## 2020-12-20 LAB — CBC
HCT: 24.9 % — ABNORMAL LOW (ref 39.0–52.0)
Hemoglobin: 8.1 g/dL — ABNORMAL LOW (ref 13.0–17.0)
MCH: 28.8 pg (ref 26.0–34.0)
MCHC: 32.5 g/dL (ref 30.0–36.0)
MCV: 88.6 fL (ref 80.0–100.0)
Platelets: 273 10*3/uL (ref 150–400)
RBC: 2.81 MIL/uL — ABNORMAL LOW (ref 4.22–5.81)
RDW: 16.4 % — ABNORMAL HIGH (ref 11.5–15.5)
WBC: 7.4 10*3/uL (ref 4.0–10.5)
nRBC: 0 % (ref 0.0–0.2)

## 2020-12-20 LAB — MAGNESIUM: Magnesium: 2 mg/dL (ref 1.7–2.4)

## 2020-12-20 LAB — VANCOMYCIN, PEAK: Vancomycin Pk: 50 ug/mL — ABNORMAL HIGH (ref 30–40)

## 2020-12-20 LAB — VANCOMYCIN, TROUGH: Vancomycin Tr: 21 ug/mL (ref 15–20)

## 2020-12-20 LAB — PHOSPHORUS: Phosphorus: 2.5 mg/dL (ref 2.5–4.6)

## 2020-12-20 IMAGING — US US EXTREM  UP VENOUS*R*
1 series · 13 of 24 positions shown · non-contrast
Comparison: None.

CLINICAL DATA: 69-year-old male with swelling in the arm and
presence of a PICC



[Series 1: us venous img upper uni right (dvt) · portal-venous · 13 of 35 slices shown]
[im 1/35]
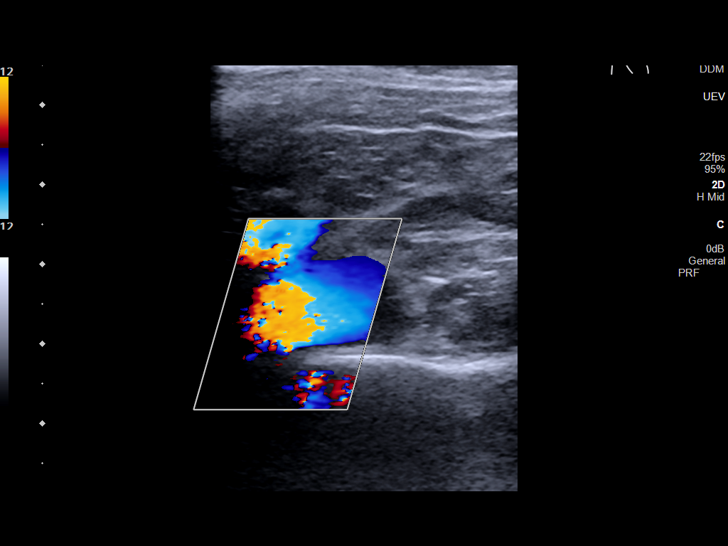
[im 3/35]
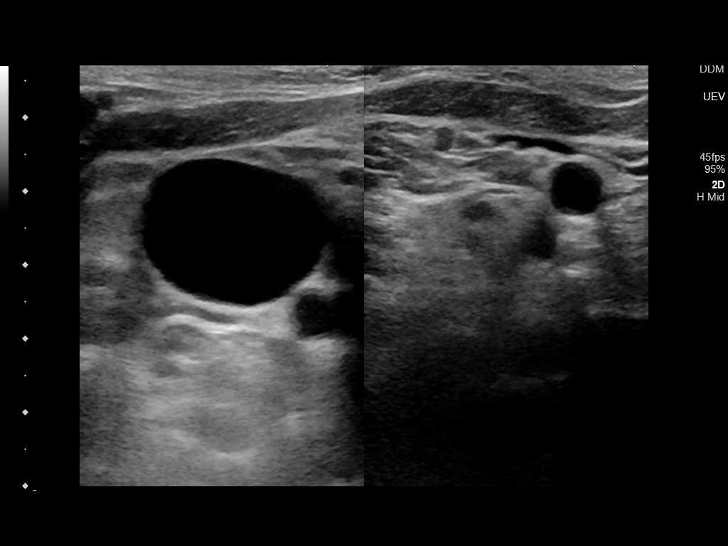
[im 6/35]
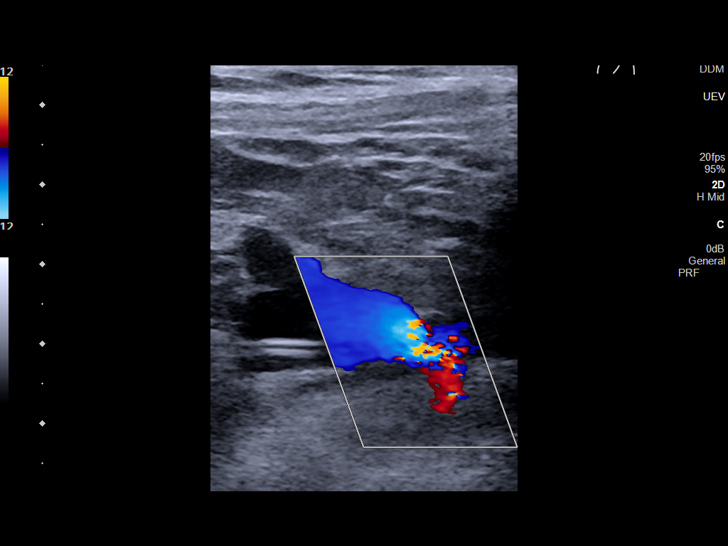
[im 9/35]
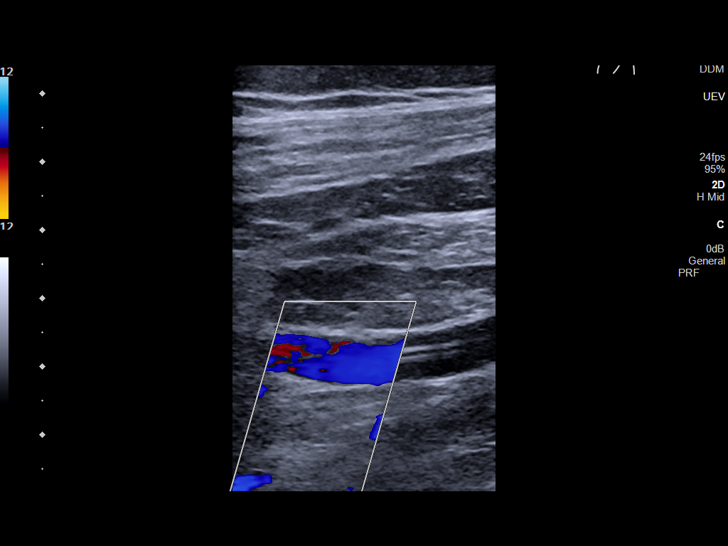
[im 12/35]
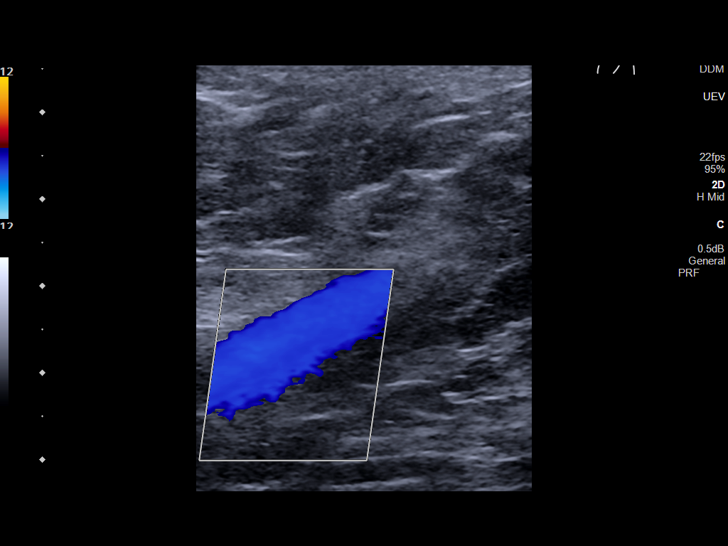
[im 15/35]
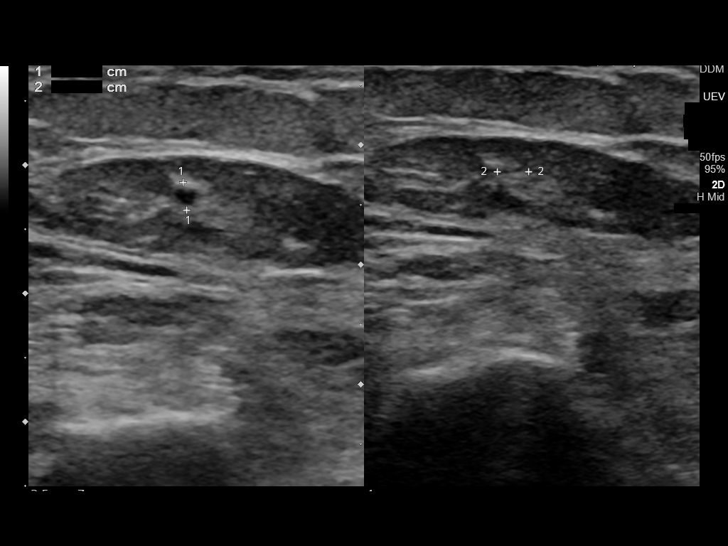
[im 18/35]
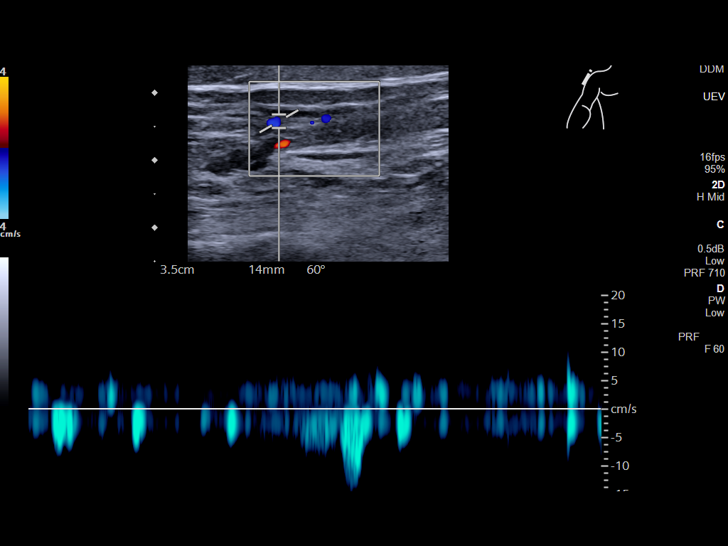
[im 20/35]
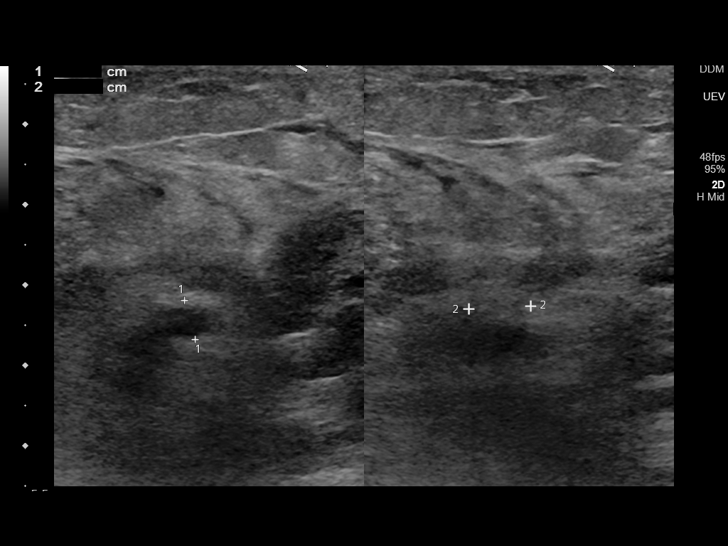
[im 23/35]
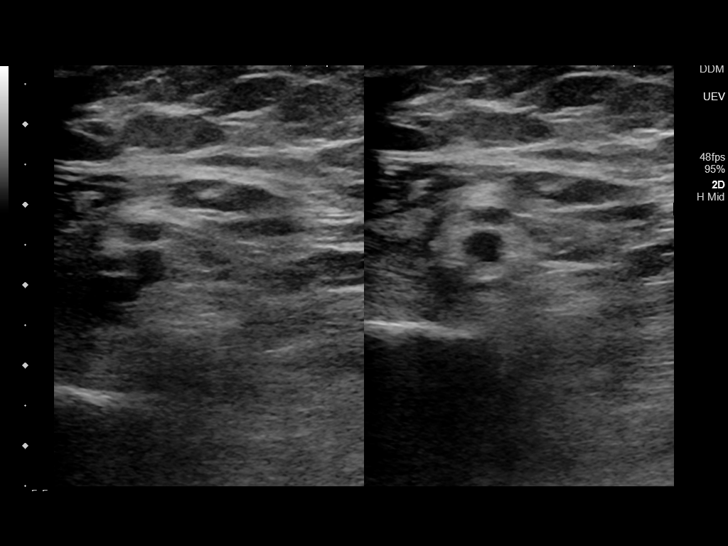
[im 26/35]
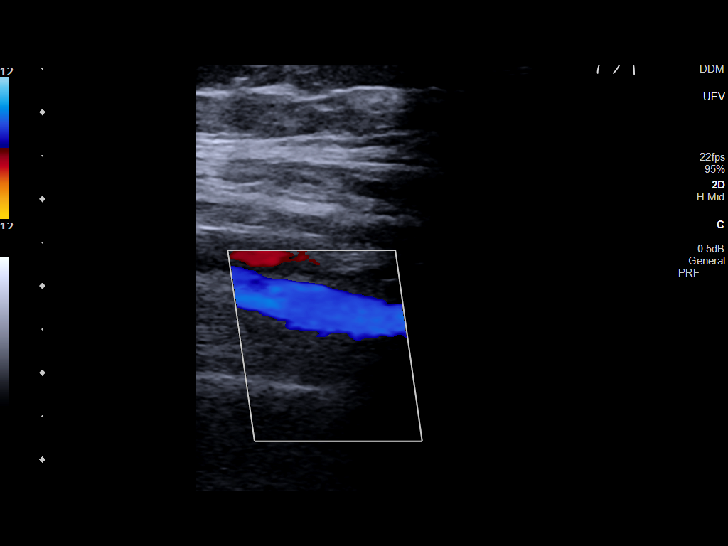
[im 29/35]
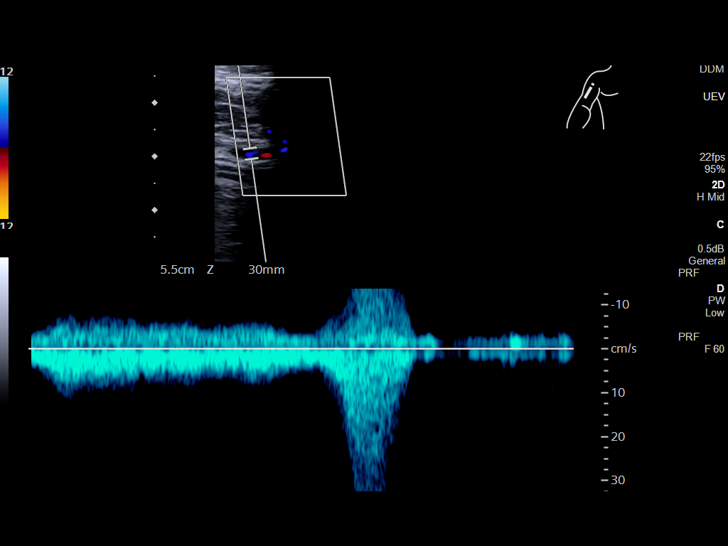
[im 32/35]
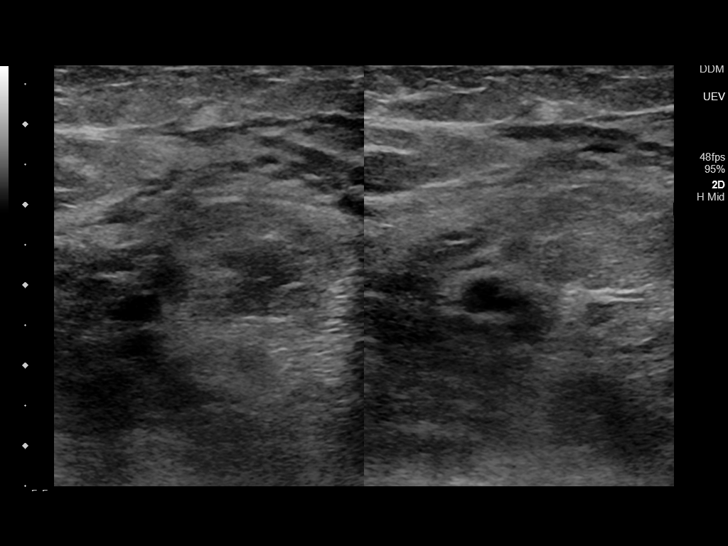
[im 35/35]
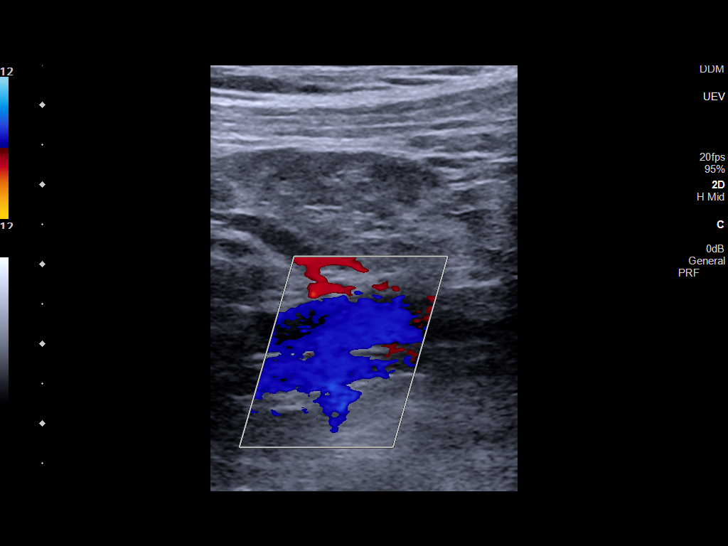

[13 of 24 positions shown; findings below may reference images not displayed]

FINDINGS: Contralateral Subclavian Vein: Respiratory phasicity is normal and
symmetric with the symptomatic side. No evidence of thrombus. Normal
compressibility.

Internal Jugular Vein: No evidence of thrombus. Normal
compressibility, respiratory phasicity and response to augmentation.

Subclavian Vein: No evidence of thrombus. Normal compressibility,
respiratory phasicity and response to augmentation.

Axillary Vein: No evidence of thrombus. Normal compressibility,
respiratory phasicity and response to augmentation.

Cephalic Vein: No evidence of thrombus. Normal compressibility,
respiratory phasicity and response to augmentation.

Basilic Vein: No evidence of thrombus. Normal compressibility,
respiratory phasicity and response to augmentation.

Brachial Veins: No evidence of thrombus. Normal compressibility,
respiratory phasicity and response to augmentation.

Radial Veins: No evidence of thrombus. Normal compressibility,
respiratory phasicity and response to augmentation.

Ulnar Veins: No evidence of thrombus. Normal compressibility,
respiratory phasicity and response to augmentation.

Other Findings:  A PICC is present, incompletely visualized.
IMPRESSION: Sonographic survey right upper extremity negative for DVT

## 2020-12-20 SURGERY — CRANIOTOMY HEMATOMA EVACUATION SUBDURAL
Anesthesia: General

## 2020-12-20 MED ORDER — FREE WATER
200.0000 mL | Status: DC
  Administered 2020-12-20 – 2020-12-25 (×29): 200 mL

## 2020-12-20 MED ORDER — POTASSIUM CHLORIDE 20 MEQ PO PACK
40.0000 meq | PACK | Freq: Once | ORAL | Status: AC
  Administered 2020-12-20: 40 meq
  Filled 2020-12-20: qty 2

## 2020-12-20 MED ORDER — BACLOFEN 10 MG PO TABS
10.0000 mg | ORAL_TABLET | Freq: Four times a day (QID) | ORAL | Status: DC
  Administered 2020-12-20 – 2021-01-05 (×66): 10 mg
  Filled 2020-12-20 (×73): qty 1

## 2020-12-20 MED ORDER — VANCOMYCIN HCL 500 MG/100ML IV SOLN
500.0000 mg | Freq: Two times a day (BID) | INTRAVENOUS | Status: DC
  Administered 2020-12-20 – 2020-12-25 (×10): 500 mg via INTRAVENOUS
  Filled 2020-12-20 (×13): qty 100

## 2020-12-20 SURGICAL SUPPLY — 60 items
APPLICATOR CHLORAPREP 10.5 ORG (MISCELLANEOUS) ×4 IMPLANT
BLADE CLIPPER SPEC (BLADE) ×2 IMPLANT
BLADE ULTRA TIP 2M (BLADE) ×2 IMPLANT
BUR ACORN 7.5 PRECISION (BURR) ×2 IMPLANT
BUR SPIRAL ROUTER 2.3 (BUR) ×2 IMPLANT
CLIP VESOCCLUDE MED 6/CT (CLIP) ×2 IMPLANT
COUNTER NEEDLE 20/40 LG (NEEDLE) ×2 IMPLANT
COVER WAND RF STERILE (DRAPES) ×2 IMPLANT
DRAIN JP 10F RND SILICONE (MISCELLANEOUS) ×2 IMPLANT
DRAPE INCISE 23X17 IOBAN STRL (DRAPES) ×2
DRAPE INCISE IOBAN 23X17 STRL (DRAPES) ×2 IMPLANT
DRAPE INCISE IOBAN 66X45 STRL (DRAPES) ×2 IMPLANT
DRAPE MICROSCOPE SPINE 48X150 (DRAPES) ×2 IMPLANT
DRAPE POUCH INSTRU U-SHP 10X18 (DRAPES) ×2 IMPLANT
DRAPE SURG 17X11 SM STRL (DRAPES) ×8 IMPLANT
DRAPE WARM FLUID 44X44 (DRAPES) ×2 IMPLANT
DRSG TEGADERM 4X4.75 (GAUZE/BANDAGES/DRESSINGS) ×2 IMPLANT
ELECT CAUTERY BLADE TIP 2.5 (TIP) ×2
ELECT REM PT RETURN 9FT ADLT (ELECTROSURGICAL) ×2
ELECTRODE CAUTERY BLDE TIP 2.5 (TIP) ×1 IMPLANT
ELECTRODE REM PT RTRN 9FT ADLT (ELECTROSURGICAL) ×1 IMPLANT
GAUZE XEROFORM 1X8 LF (GAUZE/BANDAGES/DRESSINGS) ×2 IMPLANT
GLOVE SURG SYN 7.0 (GLOVE) ×4 IMPLANT
GLOVE SURG SYN 8.5  E (GLOVE) ×3
GLOVE SURG SYN 8.5 E (GLOVE) ×3 IMPLANT
GLOVE SURG UNDER POLY LF SZ7 (GLOVE) ×2 IMPLANT
GOWN SRG XL LVL 3 NONREINFORCE (GOWNS) ×1 IMPLANT
GOWN STRL NON-REIN TWL XL LVL3 (GOWNS) ×1
GRADUATE 1200CC STRL 31836 (MISCELLANEOUS) ×2 IMPLANT
GRAFT DURAGEN MATRIX 1WX1L (Tissue) IMPLANT
GRAFT DURAGEN MATRIX 2WX2L IMPLANT
GRAFT DURAGEN MATRIX 3WX3L (Graft) IMPLANT
HEMOSTAT SURGICEL 2X14 (HEMOSTASIS) ×4 IMPLANT
KIT TURNOVER KIT A (KITS) ×2 IMPLANT
MANIFOLD NEPTUNE II (INSTRUMENTS) ×2 IMPLANT
MARKER SKIN DUAL TIP RULER LAB (MISCELLANEOUS) ×4 IMPLANT
NS IRRIG 1000ML POUR BTL (IV SOLUTION) ×4 IMPLANT
PACK CRANIOTOMY CUSTOM (CUSTOM PROCEDURE TRAY) ×2 IMPLANT
PAD ARMBOARD 7.5X6 YLW CONV (MISCELLANEOUS) ×4 IMPLANT
PATTIES SURGICAL .5X1.5 (GAUZE/BANDAGES/DRESSINGS) ×2 IMPLANT
PIN MAYFIELD SKULL DISP (PIN) ×2 IMPLANT
SET CATH VENT DRAIN 3-15 1.9D (DRAIN) IMPLANT
SHEET NEURO XL SOL CTL (MISCELLANEOUS) ×2 IMPLANT
SOL PREP PVP 2OZ (MISCELLANEOUS) ×2
SOLUTION PREP PVP 2OZ (MISCELLANEOUS) ×1 IMPLANT
SPOGE SURGIFLO 8M (HEMOSTASIS) ×1
SPONGE SURGIFLO 8M (HEMOSTASIS) ×1 IMPLANT
STAPLER SKIN PROX 35W (STAPLE) ×2 IMPLANT
SURGILUBE 2OZ TUBE FLIPTOP (MISCELLANEOUS) ×2 IMPLANT
SUT MNCRL AB 3-0 PS2 27 (SUTURE) ×2 IMPLANT
SUT NURALON 4 0 TR CR/8 (SUTURE) ×2 IMPLANT
SUT SILK 3 0 (SUTURE) ×1
SUT SILK 3-0 18XBRD TIE 12 (SUTURE) ×1 IMPLANT
SYR 20ML LL LF (SYRINGE) ×4 IMPLANT
TAPE CLOTH 3X10 WHT NS LF (GAUZE/BANDAGES/DRESSINGS) ×2 IMPLANT
TISSEEL 4ML HEMOSTATIC FIBRIN (Miscellaneous) IMPLANT
TOWEL OR 17X26 4PK STRL BLUE (TOWEL DISPOSABLE) ×2 IMPLANT
TRAY FOLEY SLVR 16FR TEMP STAT (SET/KITS/TRAYS/PACK) ×2 IMPLANT
TUBE CONNECTING 20X1/4 (TUBING) ×4 IMPLANT
WATER STERILE IRR 1000ML POUR (IV SOLUTION) ×2 IMPLANT

## 2020-12-20 NOTE — Progress Notes (Signed)
[facility] ADULT ICU REPLACEMENT PROTOCOL   The patient does apply for the [facility] Adult ICU Electrolyte Replacment Protocol based on the criteria listed below:   1. Is GFR >/= 30 ml/min? Yes.    Patient's GFR today is >60 2. Is SCr </= 2? Yes.   Patient's SCr is 0.55 ml/kg/hr 3. Did SCr increase >/= 0.5 in 24 hours? No. 4. Abnormal electrolyte(s):  K 3.4 5. Ordered repletion with: protocol 6. If a panic level lab has been reported, has the CCM MD in charge been notified? Yes.  .   Physician:  S. Rosie Fate R Pippa Hanif 12/20/2020 5:51 AM

## 2020-12-20 NOTE — Progress Notes (Signed)
Patient experienced a vagal episode with changing of the inner cannula of trach, patient has also had a few other episodes during the day with RN as witness. Dr Mortimer Fries notified and PSV was increased to 15.

## 2020-12-20 NOTE — Progress Notes (Signed)
Pharmacy Antibiotic Note  Ryan Day is a 70 y.o. male admitted on 12/07/2020 with sepsis. Pt presented with complaints of recurrent UTI and hematuria with foley cath last placed ~2 weeks ago, exchanged in ED. 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 Pt has PEG and trache. Pt admitted 11/22-12/5 for HCAP and sacral ulcer with osteomyelitis. Hx of enterobacter cloacae, pseudomonas in ucx. Pt has received vanc, Unasyn, Zosyn, and cefepime on recent previous admissions. ID consulted. Pt with polymicrobial bacteremia with sepsis resulting from multiple sources including UTI, PNA, and sacral wound. Meropenem was discontinued 3/22 based on sensitivity to ceftazidime from BAL. IV Cipro was added due to increased secretions. Pharmacy has been consulted for vancomycin dosing.   Day 15 abx, day 9 cipro  Plan: Ciprofloxacin 400 mg q8h   Cefepime 2 grams q8h   Vancomycin 750 q12h. Goal AUC 400-550. --Expected AUC: 442, Cmin 13.5 --Vancomycin peak 3/30 at 0500 = 50 (drawn 1.5 hours after completion of infusion) --Vancomycin trough 3/30 at 1330 = 21 --Calculated AUC: 851, Cmin 19 --Interpretation: AUC above goal range  Adjust vancomycin 500 mg q12h. Goal AUC 400-550 --Expected AUC: 567, Cmin 12.4 --Daily Scr per protocol --Re-check vancomycin levels 4/1   Height: 6' 5.01" (195.6 cm) Weight: 130 kg (286 lb 9.6 oz) IBW/kg (Calculated) : 89.12   Temp (24hrs), Avg:96.8 F (36 C), Min:96.5 F (35.8 C), Max:97 F (36.1 C)  Recent Labs  Lab 12/16/20 0450 12/16/20 0949 12/17/20 0444 12/17/20 0623 12/18/20 0524 12/19/20 0515 12/19/20 0625 12/19/20 1300 12/20/20 0430 12/20/20 0500 12/20/20 1330  WBC 12.5*  --  8.7  --  9.2 8.0  --   --  7.4  --   --   CREATININE 0.53*  --   --  0.57* 0.51* 0.56*  --   --  0.55*  --   --   VANCOTROUGH  --    < >  --   --   --   --   --  23*  --   --  21*  VANCOPEAK  --    < >  --   --   --   --  28*  --   --  50*  --    <  > = values in this interval not displayed.    Estimated Creatinine Clearance: 131.9 mL/min (A) (by C-G formula based on SCr of 0.55 mg/dL (L)).    Allergies  Allergen Reactions  . Shrimp (Diagnostic)     Experiences facial droop when eating seafood    Antimicrobials this admission: 3/17 Vancomycin  >>  3/17 Cefepime  >> 3/20; 3/22 >> 3/17 Zosyn x 1  3/20 Meropenem >> 3/21  3/22 Cipro >>  Dose adjustments this admission: 3/27 Vanc 1000 mg q12h to 750 mg q12h 3/30 Vanc 750 mg q12h to 500 mg q12h  Microbiology results: 3/17 MRSA PCR: positive 3/17 BCx: E cloacae, MRSA, E faecalis 3/17 UCx: E cloacae 3/18 Resp cx: PSA 3/18 BAL: PSA (ceftazidime sensitive), MRSA, E faecalis 3/21 Sacral cx: MRSA, PSA 3/23 BCx: NGTD 3/27 Respiratory Cx: PSA, pending  Thank you for allowing pharmacy to be a part of this patient's care.  Benita Gutter 12/20/2020

## 2020-12-20 NOTE — Progress Notes (Signed)
Nutrition Follow Up Note   DOCUMENTATION CODES:   Not applicable  INTERVENTION:   Continue Pivot 1.5 '@70ml' /hr + Pro-Source 37m daily via tube  Free water flushes 2016mq4 hrs per MD  Regimen provides 2560kcal/day, 168g/day protein and 257521may free water   Juven Fruit Punch BID via tube, each serving provides 95kcal and 2.5g of protein (amino acids glutamine and arginine)  NUTRITION DIAGNOSIS:   Inadequate oral intake related to dysphagia as evidenced by NPO status (pt with chronic PEG).  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 PEG tube in place. Pt tolerating tube feeds at goal rate. Per chart, pt up ~35lbs since admit.   Enteral Access: 20 Fr. PEG-tube placed 05/04/20  Medications reviewed and include: vitamin C, ferrous sulfate, insulin, florastor, senokot, simethicone, zinc, cefepime, ciprofloxacin, pepcid  Labs reviewed: K 3.4(L), BUN 32(H), P 2.5 wnl, Mg 2.0 wnl Hgb 8.1(L), Hct 24.9(L) cbgs- 133, 137, 132 x 24 hrs  Patient is currently intubated on ventilator support MV: 12.9 L/min Temp (24hrs), Avg:97.1 F (36.2 C), Min:96.5 F (35.8 C), Max:97.9 F (36.6 C)  MAP- >55m80m  UOP- [id number] Order:    Diet Order            Diet NPO time specified  Diet effective now                EDUCATION NEEDS:   No education needs have been identified at this time  Skin:  Skin Assessment: Reviewed RN Assessment (Right gluteal, extends to hip: 1 cm x 1 cm x 0.2 cm, Sacrum: 2.5 cm x 2 cm x 1.5 cm, Right heel:  2.7 cm x 2.4 cm x 0.1 cm, Left heel: 4.5 cm x 5.6 cm x 0.1 cm)  Last BM:  3/30- TYPE 7  Height:   Ht Readings from Last 1 Encounters:  12/20/20  6' 5.01" (1.956 m)    Weight:   Wt Readings from Last 1 Encounters:  12/20/20 130 kg    Ideal Body Weight:  94.5 kg  BMI:  Body mass index is 33.98 kg/m.  Estimated Nutritional Needs:   Kcal:  2600-2800kcal/day  Protein:  140-165 grams  Fluid:  2.6-2.9L/day  CaseyKoleen DistanceRD, LDN Please refer to [facility] and/or RD on-call/weekend/after hours pager

## 2020-12-20 NOTE — Progress Notes (Signed)
Patient has been having multiple episodes of bradycardia with pauses. Got notified several times by CCMD. Mostly spontaneously, no patient care being done on those episodes. Dr. Mortimer Fries made aware. Electrolytes replaced as needed.   Vent settings changes were made. Fi02 increased and PS increased and it lessened the brady episodes.   Dr. Mortimer Fries consulted cardiology.  Wound care done on sacral area and bilateral heels. Measured and recorded. Daughter at bedside and took pictures of the sacral wound.

## 2020-12-20 NOTE — TOC Progression Note (Addendum)
Transition of Care [facility]) - Progression Note    Patient Details  Name: Ryan Day MRN: [id number] Date of Birth: [birth year]  Transition of Care [facility]) CM/SW [place], Page Phone Number: [phone] 12/20/2020, 12:46 PM  Clinical Narrative:     Patient remains critically ill.  CSW spoke with patient and patient's spouse Ryan Day, Ryan Day (Spouse) [phone].  They both denied any needs.  TOC will continue to follow.     Expected Discharge Plan: [place] Barriers to Discharge: Continued Medical Work up  Expected Discharge Plan and Services Expected Discharge Plan: [place] In-house Referral: Clinical Social Work   Post Acute Care Choice: Durable Medical Equipment Living arrangements for the past 2 months: Single Family Home                                       Social Determinants of Health (SDOH) Interventions    Readmission Risk Interventions No flowsheet data found.

## 2020-12-20 NOTE — Plan of Care (Signed)
.  PT  Suctioned Q2 and PRN, given PRN as necessary for anxiety. SB-SR on monitor- asymptomatic. No other significant events during shift. Relinquishing care Problem: Education: Goal: Knowledge of General Education information will improve Description: Including pain rating scale, medication(s)/side effects and non-pharmacologic comfort measures Outcome: Progressing   Problem: Health Behavior/Discharge Planning: Goal: Ability to manage health-related needs will improve Outcome: Progressing   Problem: Clinical Measurements: Goal: Ability to maintain clinical measurements within normal limits will improve Outcome: Progressing Goal: Will remain free from infection Outcome: Progressing Goal: Diagnostic test results will improve Outcome: Progressing Goal: Respiratory complications will improve Outcome: Progressing Goal: Cardiovascular complication will be avoided Outcome: Progressing

## 2020-12-20 NOTE — Progress Notes (Signed)
NAME:  Ryan Day, MRN:  [id number], DOB:  Sep 02, 1951, LOS: 14 ADMISSION DATE:  12/07/2020,   CHIEF COMPLAINT: Hematuria   History of Present Illness:  70 yo quadriplegic male with a chronic tracheostomy who presented to [facility] ER on 03/17 with hematuria.  According to pts family last week pt noted to have sediment in his urine at home concerning for UTI.  He had a foley catheter placed 2 weeks ago and completed a 7 day course of abx therapy due to UTI.  Due to concerns of recurrent UTI pts family contacted pts PCP office last week, and heard from his PCP this week and were awaiting urine culture results.  However, pt noted to have hematuria yesterday and today along with sinus tachycardia hr 120's.  Pts daughter is a Marine scientist and due to concern of sepsis pt transported to the ER via EMS for further evaluation and treatment.    ED course Upon arrival to the ER pts O2 sats were 75-80% on RA, therefore pt placed on NRB with O2 sats initially increasing to 100%.  Pt transitioned to trach collar with initial O2 sats in the low 90's.  However, pt noted to have mucous plugging resulting in hypoxia requiring exchange of tracheostomy from a cuffless trach to a size 6 mm cuffed trach and pt placed on mechanical ventilation.  ER vital signs were: temp 101.7 rectally, sbp 60-80's, and hr 130's. Lab results revealed Na+ 153, chloride 119, glucose 147, lactic acid 2.8, wbc 2.0, hbg 11.1, and UA positive for UTI.  CXR negative and COVID-19/Influenza PCR results pending.  Sepsis protocol initiated and pt received cefepime, vancomycin, and 2L LR bolus.  Pt remained severely hypotensive requiring levophed gtt and right femoral central line placement.  PCCM team contacted for ICU admission.    12/08/20- patient is improved some he is communicative.  Wife [name] at bedside we reviewed care plan.   12/10/20- patient is improved he is down to 37mg on levophed and 30%Fio2 on ventilator, mentating well. Reviewed careplan with wife  LVaughan Bastatoday.  12/11/20 PICC 12/12/20 Mucus plug, cleared with PPV-ambu 12/17/2020 increased FiO2 requirements, increased work of breathing.  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 C5 Cervical Fracture (following a mechanical fall at home) s/p C3-6 Laminectomy and Posterior Instrumentation and Fusion 08/4  Quadriplegia  Spinal Cord Compression  Chronic Tracheostomy and PEG    Significant Hospital Events: Including procedures, antibiotic start and stop dates in addition to other pertinent events   03/17: Pt admitted to ICU with urosepsis requiring vasopressors and acute on chronic hypoxic respiratory failure secondary to mucous plugging requiring exchange of cuffless trach to size 6 mm cuffed trach to be placed on mechanical ventilation  03/17: Right femoral central line placed by ER physician  03/17: CT Abd Pelvis findings with cystitis. Correlation with urinalysis is Recommended. Mild to moderate severity bibasilar atelectasis and/or Infiltrate. Sigmoid diverticulosis. Sacral decubitus ulcer, as described above. MRI correlation is recommended, as sequelae associated with acute osteomyelitis cannot be excluded. Aortic atherosclerosis. 03/17: CT Head revealed mild chronic ischemic white matter disease. No acute intracranial abnormality seen 03/17: Vancomycin x1 dose and Cefepime x1 dose 03/17: Zosyn>>completed 03/27: On Cefepime  Micro 12/07/2020 blood culture has methicillin-resistant staph aureus, Enterobacter cloacae and Enterococcus faecalis  12/13/2020 blood culture: No growth so far  12/08/2020  bronchoalveolar lavage more than 100,000 colonies of Pseudomonas aeruginosa Methicillin-resistant staph aureus  40,000 colonies Enterococcus faecalis 20,000.  12/07/2020 urine culture Enterobacter cloacae   Sacral wound has rare Pseudomonas aeruginosa rare methicillin-resistant staph aureus few Bacteroides and rare Candida albicans. Likely stool contamination   Anti-infectives (From admission, onward)   Start     Dose/Rate Route Frequency Ordered Stop   12/17/20 1400  vancomycin (VANCOREADY) IVPB 750 mg/150 mL  Status:  Discontinued        750 mg 150 mL/hr over 60 Minutes Intravenous Every 12 hours 12/17/20 1159 12/20/20 0604   12/13/20 1000  vancomycin (VANCOREADY) IVPB 1000 mg/200 mL  Status:  Discontinued        1,000 mg 200 mL/hr over 60 Minutes Intravenous Every 12 hours 12/13/20 0832 12/17/20 1159   12/12/20 1800  ciprofloxacin (CIPRO) IVPB 400 mg        400 mg 200 mL/hr over 60 Minutes Intravenous Every 8 hours 12/12/20 1620     12/12/20 1700  ciprofloxacin (CIPRO) IVPB 400 mg  Status:  Discontinued        400 mg 200 mL/hr over 60 Minutes Intravenous Every 12 hours 12/12/20 1609 12/12/20 1620   12/12/20 1615  vancomycin variable dose per unstable renal function (pharmacist dosing)  Status:  Discontinued         Does not apply See admin instructions 12/12/20 1615 12/13/20 1050   12/12/20 1100  vancomycin (VANCOREADY) IVPB 1750 mg/350 mL        1,750 mg 175 mL/hr over 120 Minutes Intravenous  Once 12/12/20 0957 12/12/20 1301   12/12/20 0600  ceFEPIme (MAXIPIME) 2 g in sodium chloride 0.9 % 100 mL IVPB        2 g 200 mL/hr over 30 Minutes Intravenous Every 8 hours 12/11/20 1929     12/11/20 1522  vancomycin variable dose per unstable renal function (pharmacist dosing)  Status:  Discontinued         Does not apply See admin instructions 12/11/20 1522 12/12/20 1607   12/11/20 0900  vancomycin (VANCOREADY) IVPB 1000 mg/200 mL  Status:  Discontinued        1,000 mg 200 mL/hr over 60 Minutes Intravenous Every 12 hours 12/11/20 0813 12/11/20 1522   12/10/20 1515  meropenem (MERREM) 1 g in  sodium chloride 0.9 % 100 mL IVPB        1 g 200 mL/hr over 30 Minutes Intravenous Every 8 hours 12/10/20 1418 12/12/20 0834   12/09/20 1000  ceFEPIme (MAXIPIME) 2 g in sodium chloride 0.9 % 100 mL IVPB  Status:  Discontinued        2 g 200 mL/hr over 30 Minutes Intravenous Every 8 hours 12/09/20 0811 12/10/20 1418   12/09/20 0730  vancomycin (VANCOREADY) IVPB 2000 mg/400 mL        2,000 mg 200 mL/hr over 120 Minutes Intravenous  Once 12/09/20 0644 12/09/20 1616   12/09/20 0600  vancomycin (VANCOREADY) IVPB 1750 mg/350 mL  Status:  Discontinued        1,750 mg 175 mL/hr over 120 Minutes Intravenous Every 24 hours 12/08/20 0809 12/08/20 1229   12/08/20 2200  ceFEPIme (MAXIPIME) 2 g in sodium chloride 0.9 % 100 mL IVPB  Status:  Discontinued        2 g 200 mL/hr over 30 Minutes Intravenous Every 12 hours 12/08/20 0809 12/09/20 0811   12/08/20 1700  vancomycin (VANCOREADY) IVPB 1500 mg/300  mL  Status:  Discontinued        1,500 mg 150 mL/hr over 120 Minutes Intravenous Every 12 hours 12/08/20 0500 12/08/20 0809   12/08/20 1230  vancomycin variable dose per unstable renal function (pharmacist dosing)  Status:  Discontinued         Does not apply See admin instructions 12/08/20 1230 12/11/20 1359   12/08/20 0545  ceFEPIme (MAXIPIME) 2 g in sodium chloride 0.9 % 100 mL IVPB  Status:  Discontinued        2 g 200 mL/hr over 30 Minutes Intravenous Every 8 hours 12/08/20 0446 12/08/20 0809   12/08/20 0530  vancomycin (VANCOREADY) IVPB 2000 mg/400 mL       "Followed by" Linked Group Details   2,000 mg 200 mL/hr over 120 Minutes Intravenous  Once 12/08/20 0441 12/08/20 0815   12/08/20 0530  vancomycin (VANCOREADY) IVPB 500 mg/100 mL  Status:  Discontinued       "Followed by" Linked Group Details   500 mg 100 mL/hr over 60 Minutes Intravenous  Once 12/08/20 0441 12/08/20 0803   12/07/20 2215  piperacillin-tazobactam (ZOSYN) IVPB 3.375 g  Status:  Discontinued        3.375 g 12.5 mL/hr over 240  Minutes Intravenous Every 8 hours 12/07/20 2124 12/08/20 0447   12/07/20 1800  ceFEPIme (MAXIPIME) 2 g in sodium chloride 0.9 % 100 mL IVPB        2 g 200 mL/hr over 30 Minutes Intravenous  Once 12/07/20 1757 12/07/20 2036   12/07/20 1800  vancomycin (VANCOCIN) IVPB 1000 mg/200 mL premix        1,000 mg 200 mL/hr over 60 Minutes Intravenous  Once 12/07/20 1757 12/07/20 1942       Interim History / Subjective:  Somnolent this  AM On vent +secretions On PS mode +pseudomonas infections   Objective   Blood pressure (!) 103/58, pulse 60, temperature (!) 96.8 F (36 C), temperature source Axillary, resp. rate (!) 27, height 6' 5.01" (1.956 m), weight 130 kg, SpO2 94 %.    Vent Mode: PSV FiO2 (%):  [21 %] 21 % PEEP:  [5 cmH20] 5 cmH20 Pressure Support:  [10 cmH20] 10 cmH20   Intake/Output Summary (Last 24 hours) at 12/20/2020 0716 Last data filed at 12/20/2020 3500 Gross per 24 hour  Intake 2760.23 ml  Output 4400 ml  Net -1639.77 ml   Filed Weights   12/18/20 0500 12/19/20 0500 12/20/20 0455  Weight: 130 kg 130 kg 130 kg    REVIEW OF SYSTEMS  PATIENT IS UNABLE TO PROVIDE COMPLETE REVIEW OF SYSTEMS DUE TO  CRITICAL ILLNESS   PHYSICAL EXAMINATION:  GENERAL:critically ill appearing, +resp distress HEAD: Normocephalic, atraumatic.  EYES: Pupils equal, round, reactive to light.  No scleral icterus.  MOUTH: Moist mucosal membrane. NECK: Supple. No thyromegaly. No nodules. No JVD.  PULMONARY: +rhonchi, +wheezing CARDIOVASCULAR: S1 and S2. Regular rate and rhythm. No murmurs, rubs, or gallops.  GASTROINTESTINAL: Soft, nontender, -distended. Positive bowel sounds.  MUSCULOSKELETAL: No swelling, clubbing, or edema.  NEUROLOGIC: lethargic SKIN:intact,warm,dry       Labs/imaging personally reviewed   CT Abd/Pelvis CXR CT Head  UA  Resolved Hospital Problem list   N/A  Assessment & Plan:   70 yo quadriplegic with multiple mecial issues with acute hypoxic resp  failure due to severe sepsis from pseudomonal infections of lungs and sacral decub  Severe ACUTE Hypoxic and Hypercapnic Respiratory Failure -continue Mechanical Ventilator support -continue Bronchodilator Therapy -Wean Fio2 and  PEEP as tolerated -VAP/VENT bundle implementation -will perform SAT/SBT when respiratory parameters are met +sepsis and mucus plugging S/p bronch - many GPCs , cleared bilateral airways   ACUTE DAISTOLIC CARDIAC FAILURE- Afib with RVR -oxygen as needed -Lasix as tolerated   Septic shock -use vasopressors to keep MAP>65 as needed  INFECTIOUS DISEASE -continue antibiotics as prescribed -follow up cultures -follow up ID consultation  ACUTE KIDNEY INJURY/Renal Failure -continue Foley Catheter-assess need -Avoid nephrotoxic agents -Follow urine output, BMP -Ensure adequate renal perfusion, optimize oxygenation -Renal dose medications    Intake/Output Summary (Last 24 hours) at 12/20/2020 0716 Last data filed at 12/20/2020 4742 Gross per 24 hour  Intake 2760.23 ml  Output 4400 ml  Net -1639.77 ml     NEUROLOGY Quadriplegia-bed bound Frequent reorientation  Improving Avoid sedating medications     Best practice (evaluated daily)  Diet:  NPO Pain/Anxiety/Delirium protocol (if indicated): Yes (RASS goal 0) VAP protocol (if indicated): Yes DVT prophylaxis: Systemic AC GI prophylaxis: H2B Glucose control:  SSI No Central venous access:  Yes, and it is still needed Arterial line:  N/A Foley:  Yes, and it is still needed Mobility:  bed rest  PT consulted: N/A Last date of multidisciplinary goals of care discussion [N/A]  Code Status:  full code Disposition: ICU      Labs   CBC: Recent Labs  Lab 12/14/20 0302 12/15/20 0343 12/16/20 0450 12/17/20 0444 12/18/20 0524 12/19/20 0515 12/20/20 0430  WBC 13.1* 14.6* 12.5* 8.7 9.2 8.0 7.4  NEUTROABS 10.2* 11.6*  --   --   --   --   --   HGB 7.0* 6.9* 7.8* 7.2* 7.6* 8.0* 8.1*  HCT  22.6* 22.6* 24.8* 22.7* 23.9* 25.2* 24.9*  MCV 94.6 94.6 91.5 90.8 89.5 88.7 88.6  PLT 134* 156 176 158 211 263 595    Basic Metabolic Panel: Recent Labs  Lab 12/16/20 0450 12/17/20 0623 12/18/20 0524 12/19/20 0515 12/20/20 0430  NA 144 140 138 139 142  K 3.4* 3.7 3.2* 3.5 3.4*  CL 110 107 102 104 108  CO2 _0 GLUCOSE 192* 169* 113* 138* 146*  BUN 32* 32* 31* 34* 32*  CREATININE 0.53* 0.57* 0.51* 0.56* 0.55*  CALCIUM 9.0 9.1 9.2 9.3 9.3  MG 1.8 2.0 1.9 2.1 2.0  PHOS 2.3* 2.5 2.5 1.9* 2.5   GFR: Estimated Creatinine Clearance: 131.9 mL/min (A) (by C-G formula based on SCr of 0.55 mg/dL (L)). Recent Labs  Lab 12/17/20 0444 12/18/20 0524 12/19/20 0515 12/20/20 0430  WBC 8.7 9.2 8.0 7.4    Liver Function Tests: No results for input(s): AST, ALT, ALKPHOS, BILITOT, PROT, ALBUMIN in the last 168 hours. No results for input(s): LIPASE, AMYLASE in the last 168 hours. No results for input(s): AMMONIA in the last 168 hours.  ABG    Component Value Date/Time   PHART 7.38 12/08/2020 0500   PCO2ART 42 12/08/2020 0500   PO2ART 115 (H) 12/08/2020 0500   HCO3 24.8 12/08/2020 0500   ACIDBASEDEF 0.4 12/08/2020 0500   O2SAT 98.4 12/08/2020 0500     Coagulation Profile: No results for input(s): INR, PROTIME in the last 168 hours.  Cardiac Enzymes: No results for input(s): CKTOTAL, CKMB, CKMBINDEX, TROPONINI in the last 168 hours.  HbA1C: Hgb A1c MFr Bld  Date/Time Value Ref Range Status  06/09/2020 07:00 AM 5.8 (H) 4.8 - 5.6 % Final    Comment:    (NOTE) Pre diabetes:  5.7%-6.4%  Diabetes:              >6.4%  Glycemic control for   <7.0% adults with diabetes     CBG: Recent Labs  Lab 12/19/20 1102 12/19/20 1527 12/19/20 2040 12/19/20 2312 12/20/20 0424  GLUCAP 131* 127* 146* 149* 133*    Review of Systems:   Unable to assess pt chronically trached currently requiring mechanical ventilation   Allergies Allergies  Allergen Reactions   . Shrimp (Diagnostic)     Experiences facial droop when eating seafood     Scheduled Meds: . ascorbic acid  500 mg Per Tube BID  . baclofen  10 mg Oral QID  . chlorhexidine gluconate (MEDLINE KIT)  15 mL Mouth Rinse BID  . Chlorhexidine Gluconate Cloth  6 each Topical Daily  . diclofenac Sodium  4 g Topical QID  . feeding supplement (PROSource TF)  45 mL Per Tube Daily  . ferrous sulfate  75 mg Per Tube Daily  . fludrocortisone  200 mcg Per Tube Daily  . FLUoxetine  40 mg Per Tube Daily  . free water  100 mL Per Tube Q4H  . guaiFENesin  400 mg Per Tube Q6H  . insulin aspart  0-15 Units Subcutaneous Q4H  . ipratropium-albuterol  3 mL Nebulization TID  . mouth rinse  15 mL Mouth Rinse 10 times per day  . nutrition supplement (JUVEN)  1 packet Per Tube BID BM  . polycarbophil  625 mg Per Tube Daily  . rivaroxaban  10 mg Per Tube Daily  . saccharomyces boulardii  250 mg Per Tube BID  . sennosides  5 mL Per Tube Q0600  . simethicone  80 mg Per Tube QID  . traZODone  50 mg Per Tube QHS  . zinc sulfate  220 mg Per Tube Daily   Continuous Infusions: . ceFEPime (MAXIPIME) IV 2 g (12/20/20 0501)  . ciprofloxacin 400 mg (12/20/20 0505)  . famotidine (PEPCID) IV Stopped (12/19/20 2323)  . feeding supplement (PIVOT 1.5 CAL) 1,000 mL (12/19/20 1255)   PRN Meds:.acetaminophen, bisacodyl, docusate sodium, fentaNYL (SUBLIMAZE) injection, midazolam, ondansetron (ZOFRAN) IV, polyethylene glycol, sodium chloride flush    DVT/GI PRX ordered and assessed TRANSFUSIONS AS NEEDED MONITOR FSBS I Assessed the need for Labs I Assessed the need for Foley I Assessed the need for Central Venous Line Family Discussion when available I Assessed the need for Mobilization I made an Assessment of medications to be adjusted accordingly Safety Risk assessment completed  CASE DISCUSSED IN MULTIDISCIPLINARY ROUNDS WITH ICU TEAM    Critical Care Time devoted to patient care services described in this  note is 57 minutes.   Overall, patient is critically ill, prognosis is guarded.  Patient with Multiorgan failure and at high risk for cardiac arrest and death.    Corrin Parker, M.D.  [facility]  Medical Director [place] Director [facility] Cardio-Pulmonary Department

## 2020-12-20 NOTE — Progress Notes (Signed)
Date of Admission:  12/07/2020      ID: Ryan Day is a 70 y.o. male  Active Problems:   Sepsis ([place])    Subjective: Pt non verbal Asleep most of the time  Medications:  . ascorbic acid  500 mg Per Tube BID  . baclofen  10 mg Per Tube QID  . chlorhexidine gluconate (MEDLINE KIT)  15 mL Mouth Rinse BID  . Chlorhexidine Gluconate Cloth  6 each Topical Daily  . diclofenac Sodium  4 g Topical QID  . feeding supplement (PROSource TF)  45 mL Per Tube Daily  . ferrous sulfate  75 mg Per Tube Daily  . fludrocortisone  200 mcg Per Tube Daily  . FLUoxetine  40 mg Per Tube Daily  . free water  200 mL Per Tube Q4H  . guaiFENesin  400 mg Per Tube Q6H  . insulin aspart  0-15 Units Subcutaneous Q4H  . ipratropium-albuterol  3 mL Nebulization TID  . mouth rinse  15 mL Mouth Rinse 10 times per day  . nutrition supplement (JUVEN)  1 packet Per Tube BID BM  . polycarbophil  625 mg Per Tube Daily  . rivaroxaban  10 mg Per Tube Daily  . saccharomyces boulardii  250 mg Per Tube BID  . sennosides  5 mL Per Tube Q0600  . simethicone  80 mg Per Tube QID  . traZODone  50 mg Per Tube QHS  . zinc sulfate  220 mg Per Tube Daily    Objective: Vital signs in last 24 hours: Temp:  [96.5 F (35.8 C)-97.9 F (36.6 C)] 96.8 F (36 C) (03/30 0400) Pulse Rate:  [58-81] 66 (03/30 1100) Resp:  [13-81] 81 (03/30 1000) BP: (90-143)/(49-99) 90/65 (03/30 1100) SpO2:  [90 %-98 %] 92 % (03/30 1135) FiO2 (%):  [21 %] 21 % (03/30 1135) Weight:  [130 kg] 130 kg (03/30 0455)  PHYSICAL EXAM:  General: asleep  Neck: tracheostomy ven dependent  Back: sacral decubitus      Lungs: b/lair entry Heart:s1s2 Abdomen: Soft,peg foley Extremities: edematous Neurologic: quadreplegia Lab Results Recent Labs    12/19/20 0515 12/20/20 0430  WBC 8.0 7.4  HGB 8.0* 8.1*  HCT 25.2* 24.9*  NA 139 142  K 3.5 3.4*  CL 104 108  CO2 29 30  BUN 34* 32*  CREATININE 0.56* 0.55*   Liver Panel No results  for input(s): PROT, ALBUMIN, AST, ALT, ALKPHOS, BILITOT, BILIDIR, IBILI in the last 72 hours. Sedimentation Rate No results for input(s): ESRSEDRATE in the last 72 hours. C-Reactive Protein No results for input(s): CRP in the last 72 hours.  Microbiology:  Micro 12/07/2020 blood culture has methicillin-resistant staph aureus, Enterobacter cloacae and Enterococcus faecalis  12/13/2020 blood culture: No growth so far  12/08/2020 bronchoalveolar lavage more than 100,000 colonies of Pseudomonas aeruginosa Methicillin-resistant staph aureus 40,000 colonies Enterococcus faecalis 20,000.  12/07/2020 urine culture Enterobacter cloacae   Sacral wound has rare Pseudomonas aeruginosa rare methicillin-resistant staph    Assessment/Plan: Polymicrobial bacteremia with sepsis. MRSA, E faecalis, Enterobacter cloacae in blood culture. Pseudomonas in in BAL culture.  Has bilateral pulmonary infiltrates Urine culture had Enterobacter.  He has Foley catheter  Acute on chronic hypoxic respiratory failure secondary to frequent mucous plugging.  Bilateral pulmonary infiltrates being treated as HCAP.  Since he has 2 different types of Pseudomonas with a different MIC is he is on Cipro and cefepime.  We will give Total of 10 days.  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  PEG in place  Neurogenic bladder with Foley catheter.  Discussed with daughter in great detail and explained that he is colonized with multiple bacteria and will never be free of infection She did not want long term IV antibiotics

## 2020-12-20 NOTE — Consult Note (Signed)
[place] for Electrolyte Monitoring and Replacement   Recent Labs: Potassium (mmol/L)  Date Value  12/20/2020 3.4 (L)   Magnesium (mg/dL)  Date Value  12/20/2020 2.0   Calcium (mg/dL)  Date Value  12/20/2020 9.3   Albumin (g/dL)  Date Value  12/07/2020 3.1 (L)  02/01/2020 4.4   Phosphorus (mg/dL)  Date Value  12/20/2020 2.5   Sodium (mmol/L)  Date Value  12/20/2020 142  02/01/2020 143    Assessment: Patient is a 70 y/o M presented with sepsis. Pt presented with complaints of recurrent UTI and hematuria. 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 Pt has chronic tracheostomy and PEG tube. PMH includes HTN and HLD. Pharmacy has been consulted to assist with electrolyte monitoring and replacement as indicated.   Scr now around baseline (~0.5): 1.87 > 1.66>1.09>0.89 > 0.7 > 0.65 > 0.51 > 0.56 > 0.55  Nutrition: tube feeds @ 70 mL/hr, prosource tube feeds once daily   MIVF: Free water at 200 mL Q4h  Goal of Therapy:  Electrolytes within normal limits  Plan:  -- Na 142: Increase free water 200 per tube q4h (1200 mL/day)      -- K 3.4: MD ordered 40 mEq x 1 dose  -- Follow-up electrolytes with AM labs  Doreatha Massed, Student-PharmD 12/20/2020

## 2020-12-21 DIAGNOSIS — J9601 Acute respiratory failure with hypoxia: Secondary | ICD-10-CM | POA: Diagnosis not present

## 2020-12-21 DIAGNOSIS — J9611 Chronic respiratory failure with hypoxia: Secondary | ICD-10-CM | POA: Diagnosis not present

## 2020-12-21 DIAGNOSIS — R6521 Severe sepsis with septic shock: Secondary | ICD-10-CM | POA: Diagnosis not present

## 2020-12-21 DIAGNOSIS — A419 Sepsis, unspecified organism: Secondary | ICD-10-CM | POA: Diagnosis not present

## 2020-12-21 DIAGNOSIS — J9600 Acute respiratory failure, unspecified whether with hypoxia or hypercapnia: Secondary | ICD-10-CM

## 2020-12-21 DIAGNOSIS — I495 Sick sinus syndrome: Secondary | ICD-10-CM

## 2020-12-21 DIAGNOSIS — R001 Bradycardia, unspecified: Secondary | ICD-10-CM | POA: Diagnosis not present

## 2020-12-21 DIAGNOSIS — G8250 Quadriplegia, unspecified: Secondary | ICD-10-CM | POA: Diagnosis not present

## 2020-12-21 DIAGNOSIS — Z930 Tracheostomy status: Secondary | ICD-10-CM | POA: Diagnosis not present

## 2020-12-21 DIAGNOSIS — I4949 Other premature depolarization: Secondary | ICD-10-CM

## 2020-12-21 DIAGNOSIS — Z515 Encounter for palliative care: Secondary | ICD-10-CM

## 2020-12-21 LAB — BASIC METABOLIC PANEL
Anion gap: 5 (ref 5–15)
BUN: 33 mg/dL — ABNORMAL HIGH (ref 8–23)
CO2: 30 mmol/L (ref 22–32)
Calcium: 9.5 mg/dL (ref 8.9–10.3)
Chloride: 108 mmol/L (ref 98–111)
Creatinine, Ser: 0.49 mg/dL — ABNORMAL LOW (ref 0.61–1.24)
GFR, Estimated: >60 mL/min (ref >60)
Glucose, Bld: 133 mg/dL — ABNORMAL HIGH (ref 70–99)
Potassium: 3.8 mmol/L (ref 3.5–5.1)
Sodium: 143 mmol/L (ref 135–145)

## 2020-12-21 LAB — MAGNESIUM: Magnesium: 2.3 mg/dL (ref 1.7–2.4)

## 2020-12-21 LAB — TSH: TSH: 1.738 u[IU]/mL (ref 0.350–4.500)

## 2020-12-21 LAB — GLUCOSE, CAPILLARY
Glucose-Capillary: 119 mg/dL — ABNORMAL HIGH (ref 70–99)
Glucose-Capillary: 122 mg/dL — ABNORMAL HIGH (ref 70–99)
Glucose-Capillary: 123 mg/dL — ABNORMAL HIGH (ref 70–99)
Glucose-Capillary: 137 mg/dL — ABNORMAL HIGH (ref 70–99)
Glucose-Capillary: 138 mg/dL — ABNORMAL HIGH (ref 70–99)
Glucose-Capillary: 139 mg/dL — ABNORMAL HIGH (ref 70–99)

## 2020-12-21 LAB — CBC
HCT: 24 % — ABNORMAL LOW (ref 39.0–52.0)
Hemoglobin: 7.7 g/dL — ABNORMAL LOW (ref 13.0–17.0)
MCH: 29.1 pg (ref 26.0–34.0)
MCHC: 32.1 g/dL (ref 30.0–36.0)
MCV: 90.6 fL (ref 80.0–100.0)
Platelets: 268 10*3/uL (ref 150–400)
RBC: 2.65 MIL/uL — ABNORMAL LOW (ref 4.22–5.81)
RDW: 16.3 % — ABNORMAL HIGH (ref 11.5–15.5)
WBC: 6.8 10*3/uL (ref 4.0–10.5)
nRBC: 0 % (ref 0.0–0.2)

## 2020-12-21 LAB — PHOSPHORUS: Phosphorus: 2.4 mg/dL — ABNORMAL LOW (ref 2.5–4.6)

## 2020-12-21 MED ORDER — POLYETHYLENE GLYCOL 3350 17 G PO PACK: 17.0000 g | PACK | Freq: Every day | ORAL | Status: DC

## 2020-12-21 MED ORDER — MIDODRINE HCL 5 MG PO TABS
5.0000 mg | ORAL_TABLET | Freq: Two times a day (BID) | ORAL | Status: DC
  Administered 2020-12-21 – 2021-01-05 (×30): 5 mg
  Filled 2020-12-21 (×30): qty 1

## 2020-12-21 MED ORDER — PROPOFOL 1000 MG/100ML IV EMUL
0.0000 ug/kg/min | INTRAVENOUS | Status: DC
  Administered 2020-12-21: 5 ug/kg/min via INTRAVENOUS
  Administered 2020-12-21: 15 ug/kg/min via INTRAVENOUS
  Administered 2020-12-22 – 2020-12-24 (×6): 10 ug/kg/min via INTRAVENOUS
  Administered 2020-12-25: 5 ug/kg/min via INTRAVENOUS
  Administered 2020-12-25: 10 ug/kg/min via INTRAVENOUS
  Administered 2020-12-26 – 2020-12-31 (×7): 5 ug/kg/min via INTRAVENOUS
  Filled 2020-12-21 (×16): qty 100

## 2020-12-21 MED ORDER — DOCUSATE SODIUM 50 MG/5ML PO LIQD
100.0000 mg | Freq: Two times a day (BID) | ORAL | Status: DC
  Administered 2020-12-22 – 2020-12-24 (×5): 100 mg
  Filled 2020-12-21 (×5): qty 10

## 2020-12-21 NOTE — Consult Note (Signed)
Cardiology Consultation:   Patient ID: Ryan Day; [id number]; 08/26/1951   Admit date: 12/07/2020 Date of Consult: 12/21/2020  Primary Care Provider: Valerie Roys, DO Primary Cardiologist: New to [facility] - consult by [facility] Primary Electrophysiologist:  None   Patient Profile:   Ryan Day is a 70 y.o. male with a hx of quadriplegia with chronic tracheostomy and PEG tube in the context of a mechanical fall with C5 cervical fracture and spinal cord compression/edema as outlined below, PEA arrest in the context of respiratory failure, episodic neurogenic bradycardia/hypotension, chronic hypoxic and hypercapnic respiratory failure, HTN, HLD who is being seen today for the evaluation of bradycardic rates at the request of Dr. Mortimer Fries.  History of Present Illness:   Ryan Day was admitted to [facility] in 04/2020 after sustaining a mechanical fall from standing in the context of drinking.  He tripped over an ottoman, striking his head and face on a low-level TV stand.  He was found to have C5 vertebral body extension type teardrop fracture, C3-6 cord compression with edema, and spinal shock.  He underwent posterior decompression/laminectomy from D2-2 complicated by spinal shock requiring vasopressors with prolonged ICU course complicated by respiratory failure and PEA arrest requiring mechanical ventilation and trach placement.  ROSC was achieved after 8 minutes followed by targeted temperature management.  Discharge summary also indicates he had episodic neurogenic bradycardia/hypotension felt to be related to cord injury.  Echo in 04/2020 showed an EF of 50%, mild concentric LVH, no regional wall motion abnormalities, normal LV diastolic function parameters, normal RV systolic function, and trivial MR/TR.  He was admitted to the hospital in 07/2020 with acute on chronic hypoxic hypercapnic respiratory failure secondary to recurrent aspiration pneumonia and recurrent mucous  plugging.  Discharge summary indicates cardiology recommended Xarelto given patient at increased risk for DVT/PE.  I do not see a cardiology note from that admission.  Echo during that admission showed an EF of 55 to 60%, no regional wall motion abnormalities, grade 1 diastolic dysfunction, normal RV systolic function and ventricular cavity size, and no significant valvular abnormalities.  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  He was admitted to the hospital on 12/07/2020 with hematuria in the context of UTI with concern for recurrent urinary tract infection.  Upon arrival he was hypoxic with O2 saturations of 75 to 80% on room air initially requiring NRB, subsequently being transitioned to trach collar with noted mucous plugging resulting in hypoxia.  He was febrile with a temp of 101.7 rectally.  BP was initially soft in the 02R to 42H systolic.  Sepsis protocol was initiated with the patient receiving cefepime, vancomycin, and 2 L of lactated Ringer bolus.  He did require vasopressor support with Levophed early on in his admission.  CT abdomen/pelvis consistent with cystitis as well as mild to moderate bibasilar atelectasis and or infiltrate, and sacral decubitus ulcer noted.  CT head showed mild chronic ischemic white matter disease with no acute intracranial abnormality.  Blood culture obtained on 3/17+ for MRSA, Enterobacter cloacae and faecalis.  Urine culture on 3/17 with Enterobacter cloacae.  BAL with Pseudomonas aeruginosa and MRSA on 3/18.  Sacral wound growing Pseudomonas and MRSA, as well as Bacteroides and Candida felt to be stool contamination.  He has been managed by critical care and is no longer requiring vasopressor support.  Cardiology was contacted on 3/31  after it was noted that the patient had several episodes of brief bradycardic heart rates on 3/30 as outlined below.   In total, he has had 6 episodes with each episode lasting less than 1 minute, some less than 30 seconds.  BP has been stable.  Review of labs shows he has intermittently had a mild hypokalemia with normal magnesium.  Prior TSH normal in 07/2020.  Echo on 12/09/2020 showed an EF of 55 to 60%, no regional wall motion abnormalities, mild LVH, grade 2 diastolic dysfunction, normal RV systolic function and ventricular cavity size, and no significant valvular abnormality.  High-sensitivity troponin negative.  He has not been on any AV nodal blocking medications.  Past Medical History:  Diagnosis Date  . Acute on chronic respiratory failure with hypoxia ([place])   . Acute renal injury due to hypovolemia ([place])   . Autonomic instability   . Cardiac arrest ([place])   . Cervical spinal cord injury, sequela ([place])   . Elevated alkaline phosphatase level   . 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 Hyperlipidemia   . Hypertension     Past Surgical History:  Procedure Laterality Date  . APPLICATION OF WOUND VAC N/A 08/16/2020   Procedure: APPLICATION OF WOUND VAC;  Surgeon: Fredirick Maudlin, MD;  Location: [facility] ORS;  Service: General;  Laterality: N/A;  . COLONOSCOPY WITH PROPOFOL N/A 12/05/2017   Procedure: COLONOSCOPY WITH PROPOFOL;  Surgeon: Lin Landsman, MD;  Location: [facility] ENDOSCOPY;  Service: Gastroenterology;  Laterality: N/A;  . ESOPHAGOGASTRODUODENOSCOPY  12/05/2017   Procedure: ESOPHAGOGASTRODUODENOSCOPY (EGD);  Surgeon: Lin Landsman, MD;  Location: [facility];  Service: Gastroenterology;;  . INCISION AND DRAINAGE ABSCESS N/A 08/16/2020   Procedure: INCISION AND DRAINAGE ABSCESS, SACRAL;  Surgeon: Fredirick Maudlin, MD;  Location: [facility] ORS;  Service: General;  Laterality: N/A;     Home Meds: Prior to Admission medications   Medication Sig Start Date End Date Taking? Authorizing Provider  ascorbic acid (VITAMIN C) 500 MG tablet Place 1 tablet (500 mg total) into feeding tube  2 (two) times daily. 07/18/20  Yes Love, Ivan Anchors, PA-C  baclofen (LIORESAL) 10 MG tablet Take 1 tablet (10 mg total) by mouth 3 (three) times daily. Patient taking differently: Take 10 mg by mouth 4 (four) times daily. 11/12/20  Yes Johnson, Megan P, DO  bisacodyl (DULCOLAX) 10 MG suppository Place 1 suppository (10 mg total) rectally at bedtime. 07/20/20  Yes Love, Ivan Anchors, PA-C  famotidine (PEPCID) 20 MG tablet Place 1 tablet (20 mg total) into feeding tube daily. 07/20/20  Yes Love, Ivan Anchors, PA-C  ferrous sulfate 300 (60 Fe) MG/5ML syrup Place 1.3 mLs (78 mg total) into feeding tube daily. 07/20/20  Yes Love, Ivan Anchors, PA-C  fludrocortisone (FLORINEF) 0.1 MG tablet Take 0.2 mg by mouth daily. 11/02/20  Yes [provider]  FLUoxetine (PROZAC) 20 MG/5ML solution Place 10 mLs (40 mg total) into feeding tube daily. 11/22/20  Yes Vigg, Avanti, MD  guaiFENesin 200 MG tablet Place 2 tablets (400 mg total) into feeding tube every 6 (six) hours. 07/20/20  Yes Love, Ivan Anchors, PA-C  midodrine (PROAMATINE) 10 MG tablet Place 1 tablet (10 mg total) into feeding tube 3 (three) times daily with meals. 11/04/20  Yes Johnson, Megan P, DO  nutrition supplement, JUVEN, (JUVEN) PACK Place 1 packet into feeding tube 2 (two) times daily between meals. 07/20/20  Yes Love, Ivan Anchors, PA-C  Nutritional Supplements (FEEDING SUPPLEMENT, OSMOLITE 1.5 CAL,) LIQD Place 474  mLs into feeding tube 4 (four) times daily. 07/20/20  Yes Love, Ivan Anchors, PA-C  Nutritional Supplements (FEEDING SUPPLEMENT, PROSOURCE TF,) liquid Place 45 mLs into feeding tube 2 (two) times daily. 07/20/20  Yes Love, Ivan Anchors, PA-C  ondansetron (ZOFRAN) 4 MG tablet Place 1 tablet (4 mg total) into feeding tube every 8 (eight) hours as needed for nausea, vomiting or refractory nausea / vomiting. 07/20/20  Yes Love, Ivan Anchors, PA-C  polycarbophil (FIBERCON) 625 MG tablet Place 1 tablet (625 mg total) into feeding tube daily. 07/20/20  Yes Love, Ivan Anchors,  PA-C  rivaroxaban (XARELTO) 10 MG TABS tablet Take one tablet per tube daily 08/08/20  Yes Gonfa, Charlesetta Ivory, MD  saccharomyces boulardii (FLORASTOR) 250 MG capsule Place 1 capsule (250 mg total) into feeding tube 2 (two) times daily. 07/20/20  Yes Love, Ivan Anchors, PA-C  scopolamine (TRANSDERM-SCOP) 1 MG/3DAYS Place 1 patch onto the skin every 3 (three) days.   Yes [provider]  sennosides (SENOKOT) 8.8 MG/5ML syrup Place 5 mLs into feeding tube daily at 6 (six) AM. Patient taking differently: Place 5 mLs into feeding tube daily. 07/20/20  Yes Love, Ivan Anchors, PA-C  simethicone (MYLICON) 40 XL/2.4MW drops Place 1.2 mLs (80 mg total) into feeding tube 4 (four) times daily. Patient taking differently: Place 80 mg into feeding tube 4 (four) times daily -  before meals and at bedtime. 07/20/20  Yes Love, Ivan Anchors, PA-C  traZODone (DESYREL) 50 MG tablet Place 1 tablet (50 mg total) into feeding tube at bedtime. 10/23/20  Yes Jon Billings, NP  Water For Irrigation, Sterile (FREE WATER) SOLN Place 400 mLs into feeding tube every 4 (four) hours. Patient taking differently: Place 300 mLs into feeding tube every 4 (four) hours. 07/20/20  Yes Love, Ivan Anchors, PA-C  zinc sulfate 220 (50 Zn) MG capsule Place 1 capsule (220 mg total) into feeding tube daily. 07/20/20  Yes Love, Ivan Anchors, PA-C  lidocaine (LIDODERM) 5 % Place 1 patch onto the skin daily. Remove & Discard patch within 12 hours or as directed by MD Patient not taking: No sig reported 07/20/20   Bary Leriche, PA-C    Inpatient Medications: Scheduled Meds: . ascorbic acid  500 mg Per Tube BID  . baclofen  10 mg Per Tube QID  . chlorhexidine gluconate (MEDLINE KIT)  15 mL Mouth Rinse BID  . Chlorhexidine Gluconate Cloth  6 each Topical Daily  . diclofenac Sodium  4 g Topical QID  . docusate  100 mg Per Tube BID  . feeding supplement (PROSource TF)  45 mL Per Tube Daily  . ferrous sulfate  75 mg Per Tube Daily  . FLUoxetine  40 mg Per  Tube Daily  . free water  200 mL Per Tube Q4H  . guaiFENesin  400 mg Per Tube Q6H  . insulin aspart  0-15 Units Subcutaneous Q4H  . ipratropium-albuterol  3 mL Nebulization TID  . mouth rinse  15 mL Mouth Rinse 10 times per day  . midodrine  5 mg Per Tube BID WC  . nutrition supplement (JUVEN)  1 packet Per Tube BID BM  . polycarbophil  625 mg Per Tube Daily  . polyethylene glycol  17 g Per Tube Daily  . rivaroxaban  10 mg Per Tube Daily  . saccharomyces boulardii  250 mg Per Tube BID  . sennosides  5 mL Per Tube Q0600  . simethicone  80 mg Per Tube QID  . traZODone  50  mg Per Tube QHS  . zinc sulfate  220 mg Per Tube Daily   Continuous Infusions: . ceFEPime (MAXIPIME) IV 2 g (12/21/20 0549)  . ciprofloxacin 400 mg (12/21/20 5093)  . famotidine (PEPCID) IV 20 mg (12/20/20 2220)  . feeding supplement (PIVOT 1.5 CAL) 1,000 mL (12/20/20 2326)  . propofol (DIPRIVAN) infusion 5 mcg/kg/min (12/21/20 1359)  . vancomycin 500 mg (12/21/20 0852)   PRN Meds: acetaminophen, bisacodyl, docusate sodium, fentaNYL (SUBLIMAZE) injection, midazolam, ondansetron (ZOFRAN) IV, polyethylene glycol, sodium chloride flush  Allergies:   Allergies  Allergen Reactions  . Shrimp (Diagnostic)     Experiences facial droop when eating seafood    Social History:   Social History   Socioeconomic History  . Marital status: Married    Spouse name: Not on file  . Number of children: Not on file  . Years of education: Not on file  . Highest education level: Not on file  Occupational History  . Not on file  Tobacco Use  . Smoking status: Never Smoker  . Smokeless tobacco: Never Used  Vaping Use  . Vaping Use: Never used  Substance and Sexual Activity  . Alcohol use: Not Currently  . Drug use: No  . Sexual activity: Not on file  Other Topics Concern  . Not on file  Social History Narrative  . Not on file   Social Determinants of Health   Financial Resource Strain: Not on file  Food  Insecurity: Not on file  Transportation Needs: Not on file  Physical Activity: Not on file  Stress: Not on file  Social Connections: Not on file  Intimate Partner Violence: Not on file     Family History:   Family History  Problem Relation Age of Onset  . Cancer Father        lung  . Cancer Brother        throat  . Heart disease Brother 73  . Liver cancer Brother   . Alcohol abuse Brother     ROS:  Review of Systems  Unable to perform ROS: Other  Trach  Physical Exam/Data:   Vitals:   12/21/20 1145 12/21/20 1200 12/21/20 1322 12/21/20 1330  BP: 112/69 112/65  114/62  Pulse: 84 83  81  Resp: 19 20  (!) 24  Temp: 98.06 F (36.7 C) 98.06 F (36.7 C)  97.88 F (36.6 C)  TempSrc:      SpO2: 93% 95% 96% 96%  Weight:      Height:        Intake/Output Summary (Last 24 hours) at 12/21/2020 1417 Last data filed at 12/21/2020 0640 Gross per 24 hour  Intake 200 ml  Output 1790 ml  Net -1590 ml   Filed Weights   12/19/20 0500 12/20/20 0455 12/21/20 0500  Weight: 130 kg 130 kg 130 kg   Body mass index is 33.98 kg/m.   Physical Exam: General: Well developed, well nourished, in no acute distress. Head: Normocephalic, atraumatic, sclera non-icteric, no xanthomas, nares without discharge.  Neck: Negative for carotid bruits.  JVD unable to be assessed secondary to respiratory support apparatus. Lungs: Strained and diminished coarse breath sounds bilaterally.  Trach noted. Heart: RRR with S1 S2. No murmurs, rubs, or gallops appreciated. Abdomen: Soft, non-tender, distended with normoactive bowel sounds. No hepatomegaly. No rebound/guarding. No obvious abdominal masses. Msk:  Strength and tone appear normal for age. Extremities: No clubbing or cyanosis. 2+ bilateral lower extremity edema.  Neuro: Alert. No facial asymmetry.  Psych: Unable  to assess given trach.   EKG:  The EKG was personally reviewed and demonstrates: Sinus bradycardia, 55 bpm, occasional PACs, rare PVC,  no acute ST-T changes Telemetry:  Telemetry was personally reviewed and demonstrates: Predominant rhythm of sinus with episodes of bradycardic heart rates/junctional escape rhythm beginning at 1300 on 3/30 lasting less than 1 minute, 1341 lasting less than 1 minute, 1448 lasting less than 1 minute, 2222 lasting less than 1 minute, 2300 lasting less than 1 minute, and 0233 lasting less than 1 minute  Weights: Filed Weights   12/19/20 0500 12/20/20 0455 12/21/20 0500  Weight: 130 kg 130 kg 130 kg    Relevant CV Studies:  2D echo 12/09/2020: 1. Left ventricular ejection fraction, by estimation, is 55 to 60%. The  left ventricle has normal function. The left ventricle has no regional  wall motion abnormalities. There is mild left ventricular hypertrophy.  Left ventricular diastolic parameters  are consistent with Grade II diastolic dysfunction (pseudonormalization).  2. Right ventricular systolic function is normal. The right ventricular  size is normal.  3. The mitral valve is normal in structure. No evidence of mitral valve  regurgitation.  4. The aortic valve is grossly normal. Aortic valve regurgitation is not  Visualized. __________  2D echo 07/2020: 1. Left ventricular ejection fraction, by estimation, is 55 to 60%. The  left ventricle has normal function. The left ventricle has no regional  wall motion abnormalities. Left ventricular diastolic parameters are  consistent with Grade I diastolic  dysfunction (impaired relaxation).  2. Right ventricular systolic function is normal. The right ventricular  size is normal.  3. The mitral valve is normal in structure. No evidence of mitral valve  regurgitation. No evidence of mitral stenosis.  4. The aortic valve is normal in structure. Aortic valve regurgitation is  not visualized. No aortic stenosis is present.   Laboratory Data:  Chemistry Recent Labs  Lab 12/19/20 0515 12/20/20 0430 12/21/20 0441  NA 139 142 143  K  3.5 3.4* 3.8  CL 104 108 108  CO2 _0 GLUCOSE 138* 146* 133*  BUN 34* 32* 33*  CREATININE 0.56* 0.55* 0.49*  CALCIUM 9.3 9.3 9.5  GFRNONAA >60 >60 >60  ANIONGAP 6 4* 5    No results for input(s): PROT, ALBUMIN, AST, ALT, ALKPHOS, BILITOT in the last 168 hours. Hematology Recent Labs  Lab 12/19/20 0515 12/20/20 0430 12/21/20 0441  WBC 8.0 7.4 6.8  RBC 2.84* 2.81* 2.65*  HGB 8.0* 8.1* 7.7*  HCT 25.2* 24.9* 24.0*  MCV 88.7 88.6 90.6  MCH 28.2 28.8 29.1  MCHC 31.7 32.5 32.1  RDW 16.1* 16.4* 16.3*  PLT 263 273 268   Cardiac EnzymesNo results for input(s): TROPONINI in the last 168 hours. No results for input(s): TROPIPOC in the last 168 hours.  BNP Recent Labs  Lab 12/17/20 0623  BNP 103.7*    DDimer No results for input(s): DDIMER in the last 168 hours.  Radiology/Studies:  US Venous Img Upper Uni Right(DVT)  Result Date: 12/20/2020 IMPRESSION: Sonographic survey right upper extremity negative for DVT Electronically Signed   By: Corrie Mckusick D.O.   On: 12/20/2020 16:56    Assessment and Plan:   1. Bradycardic rates/sinus node dysfunction/junctiontional escape rhythm: -Appears to have possibly been noted during admission at [facility] in 04/2020 as outlined above and felt to be related to spinal cord injury -Dating back to 1300 on 3/30 he has had 6 episodes of transient, brief, less than 1 minute,  and at times lasting 30 seconds, bradycardic heart rates as outlined above -Appears to be some degree of underlying sinus node dysfunction -Possibly in the setting of vasovagal etiology with underlying autonomic dysfunction in the context of patient manipulations and vent changes -Echo 12/09/2020 with a preserved LV systolic function with no regional wall motion abnormalities, mild LVH, grade 2 diastolic dysfunction, normal RV systolic function and ventricular cavity size, and no significant valvular abnormalities -Magnesium has been normal and at goal -Potassium has been  intermittently mildly low, including a value of 3.4 on 3/30, though this has improved to a current value of 3.8 -Do not suspect his mild hypokalemia has been contributing to his transient brief bradycardic rates -Last TSH in 07/2020 was normal, will update -Not currently on any AV nodal blocking medications, moving forward these should continue to be avoided -No indication for emergent venous temp wire or PPM at this time -Hemodynamically stable -If he becomes unstable or bradycardic heart rates persist consider atropine and/or dopamine -Could consider outpatient cardiac monitoring at discharge  2.  Acute on chronic diastolic CHF with lower extremity swelling: -May benefit from gentle diuresis -Possibly exacerbated by fludrocortisone, will discontinue and replace with midodrine 5 mg twice daily after discussion with MD -Echo with grade 2 diastolic dysfunction as outlined above -Documented output is -26 L, though he appears to be volume overloaded on exam  3.  Acute on chronic hypoxic and hypercapnic respiratory failure status post trach/sepsis secondary to UTI, Pseudomonas infection of the lungs, polymicrobial bacteremia, sacral decubitus ulcer: -Management per PCCM  4.  Anemia: -Hgb low though stable -Management per CCM  5.  AKI: -Likely ATN in the setting of his severe infection and hypotension previously requiring vasopressor support -Improved -Avoid nephrotoxic agents   For questions or updates, please contact [facility] Please consult [email or web address] for contact info under Cardiology/STEMI.   Signed, Christell Faith, PA-C [place] Pager: [phone] 12/21/2020, 2:17 PM

## 2020-12-21 NOTE — Consult Note (Signed)
Consultation Note Date: 12/21/2020   Patient Name: Ryan Day  DOB: 03-06-1951  MRN: [id number]  Age / Sex: 70 y.o., male   PCP: Ryan Roys, DO Referring Physician: Flora Lipps, MD   REASON FOR CONSULTATION:Establishing goals of care  Palliative Care consult requested for goals of care discussion in this 70 y.o. male with medical history significant for diplegia, C5 cervical fracture, spinal cord compression, PEA arrest, chronic tracheostomy and PEG, hypertension, hyperlipidemia, neurogenic hypotension, chronic hypoxic and hypercapnic respiratory failure.  Patient presented to the ER on 3/1 7 with concerns of hematuria.  Per notation patient had a Foley catheter placed 2 weeks prior to admission in addition to completion of antibiotic therapy x7 days for UTI.  Since admission patient is receiving IV antibiotics for sepsis and Pseudomonas infections.  Remains on mechanical ventilation.  Cardiology following for severe bradycardia.  Clinical Assessment and Goals of Care: I have reviewed medical records including lab results, imaging, Epic notes, and MAR, received report from the bedside RN, and assessed the patient.   I met at the bedside with patient and his wife Ryan Day to discuss diagnosis prognosis, Ryan Day, EOL wishes, disposition and options.  [name] is awake and alert.  He answers most questions appropriately.  Tracheostomy in place and speaks with a whisper.  Requiring propofol for agitation.  Patient does provide permission to speak with his wife on his behalf.  I introduced Palliative Medicine as specialized medical care for people living with serious illness. It focuses on providing relief from the symptoms and stress of a serious illness. The goal is to improve quality of life for both the patient and the family.  We discussed a brief life review of the patient, along with his functional and nutritional status.  Wife shares they have been married for over 30 years.  Patient worked  as a Doctor, general practice for many years.  They have 5 children 2 of whom are nurses per wife.  He is of strong [place] faith.  Prior to admission patient resided in the home with his wife and children providing 24/7 care needs.   We discussed His current illness and what it means in the larger context of His on-going co-morbidities. Natural disease trajectory and expectations at EOL were discussed.  Wife has received medical updates by attending team. We discussed at length. Mrs. Ryan Day expresses her understanding of patient's current illness and prognosis. She shares their strong [place] faith and understanding of God's will.   Mrs. Ryan Day shares she and patient's previous conversations where he has expressed he would not wish to suffer. She confirms that she is in agreement and would not wish to see her husband suffer or remain on long-term life sustaining measures. She continues to gain peace with decisions to allow him a natural dying process.   We discussed if patient was able to see himself in his current state he would not want to live that way. Emotional support provided.   Patient engage in discussions regarding full code status. Ryan Day states "something is happening in my body!" He states he is at peace with end-of-life but is concerned about leaving Ryan Day and his family.   Patient provided on what DNR meant and would look like for patient.  Wife is requesting time to have further discussions with her family/children prior to making any final decisions.   I discussed the importance of continued conversation with family and medical providers regarding overall plan of care and treatment options, ensuring  decisions are within the context of the patients values and GOCs.  Questions and concerns were addressed. The family was encouraged to call with questions or concerns.  PMT will continue to support holistically as needed.   CODE STATUS: Full code  ADVANCE DIRECTIVES: Primary  Decision Maker: Wife   SYMPTOM MANAGEMENT: Per attending  Palliative Prophylaxis:   Aspiration, Bowel Regimen, Eye Care, Frequent Pain Assessment, Oral Care, Palliative Wound Care and Turn Reposition  PSYCHO-SOCIAL/SPIRITUAL:  Support System: Family  Desire for further Chaplaincy support: No  Additional Recommendations (Limitations, Scope, Preferences):  Continue to treat with watchful waiting  Education on hospice/palliative    PAST MEDICAL HISTORY: Past Medical History:  Diagnosis Date  . Acute on chronic respiratory failure with hypoxia ([place])   . Acute renal injury due to hypovolemia ([place])   . Autonomic instability   . Cardiac arrest ([place])   . Cervical spinal cord injury, sequela ([place])   . Elevated alkaline phosphatase level   . 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 Hyperlipidemia   . Hypertension     ALLERGIES:  is allergic to shrimp (diagnostic).   MEDICATIONS:  Current Facility-Administered Medications  Medication Dose Route Frequency Provider Last Rate Last Admin  . acetaminophen (TYLENOL) 160 MG/5ML solution 320-640 mg  320-640 mg Per Tube Q4H PRN Ryan Bill, NP   640 mg at 12/19/20 2113  . ascorbic acid (VITAMIN C) tablet 500 mg  500 mg Per Tube BID Ryan Bill, NP   500 mg at 12/21/20 0906  . baclofen (LIORESAL) tablet 10 mg  10 mg Per Tube QID Ryan Day, Ryan Day   10 mg at 12/21/20 1111  . bisacodyl (DULCOLAX) suppository 10 mg  10 mg Rectal Daily PRN Ryan Day, Ryan L, NP      . ceFEPIme (MAXIPIME) 2 g in sodium chloride 0.9 % 100 mL IVPB  2 g Intravenous Q8H Ryan Billing, MD   Stopped at 12/21/20 1448  . chlorhexidine gluconate (MEDLINE KIT) (PERIDEX) 0.12 % solution 15 mL  15 mL Mouth Rinse BID Ryan Lipps, MD   15 mL at 12/21/20 0853  . Chlorhexidine Gluconate Cloth 2 % PADS 6 each  6 each Topical Daily Ryan Lipps, MD   6 each at 12/20/20 2258  . ciprofloxacin (CIPRO) IVPB 400 mg  400 mg Intravenous Q8H  Ryan Day, Ryan Day 200 mL/hr at 12/21/20 1500 Infusion Verify at 12/21/20 1500  . diclofenac Sodium (VOLTAREN) 1 % topical gel 4 g  4 g Topical QID Ryan Bill, NP   4 g at 12/21/20 1400  . docusate (COLACE) 50 MG/5ML liquid 100 mg  100 mg Per Tube BID Ryan Lipps, MD      . docusate sodium (COLACE) capsule 100 mg  100 mg Oral BID PRN Ryan Bill, NP      . famotidine (PEPCID) IVPB 20 mg premix  20 mg Intravenous Q24H Ryan Bill, NP   Stopped at 12/20/20 2250  . feeding supplement (PIVOT 1.5 CAL) liquid 1,000 mL  1,000 mL Per Tube Continuous Ottie Glazier, MD 70 mL/hr at 12/20/20 2326 1,000 mL at 12/20/20 2326  . feeding supplement (PROSource TF) liquid 45 mL  45 mL Per Tube Daily Ottie Glazier, MD   45 mL at 12/21/20 0906  . fentaNYL (SUBLIMAZE) injection 50-100 mcg  50-100 mcg Intravenous Q2H PRN Ryan Day, Ryan L, NP   100 mcg at 12/21/20 1456  . ferrous sulfate 220 (44 Fe) MG/5ML solution 75  mg  75 mg Per Tube Daily Ryan Lipps, MD   75 mg at 12/21/20 0906  . FLUoxetine (PROZAC) capsule 40 mg  40 mg Per Tube Daily Ryan Bill, NP   40 mg at 12/21/20 8182  . free water 200 mL  200 mL Per Tube Q4H Ryan Lipps, MD   200 mL at 12/21/20 1418  . guaiFENesin (ROBITUSSIN) 100 MG/5ML solution 400 mg  400 mg Per Tube Q6H Ryan Bill, NP   400 mg at 12/21/20 1111  . insulin aspart (novoLOG) injection 0-15 Units  0-15 Units Subcutaneous Q4H Nelle Don, MD   2 Units at 12/21/20 [phone]  . ipratropium-albuterol (DUONEB) 0.5-2.5 (3) MG/3ML nebulizer solution 3 mL  3 mL Nebulization TID Nelle Don, MD   3 mL at 12/21/20 1322  . MEDLINE mouth rinse  15 mL Mouth Rinse 10 times per day Ryan Lipps, MD   15 mL at 12/21/20 1418  . midazolam (VERSED) injection 1-2 mg  1-2 mg Intravenous Q2H PRN Tyler Pita, MD   2 mg at 12/20/20 0506  . midodrine (PROAMATINE) tablet 5 mg  5 mg Per Tube BID WC Dunn, Ryan M, PA-C      . nutrition supplement (JUVEN) (JUVEN)  powder packet 1 packet  1 packet Per Tube BID BM Ottie Glazier, MD   1 packet at 12/21/20 1427  . ondansetron (ZOFRAN) injection 4 mg  4 mg Intravenous Q6H PRN Ryan Bill, NP      . polycarbophil (FIBERCON) tablet 625 mg  625 mg Per Tube Daily Ryan Bill, NP   625 mg at 12/21/20 0907  . polyethylene glycol (MIRALAX / GLYCOLAX) packet 17 g  17 g Oral Daily PRN Ryan Bill, NP      . polyethylene glycol (MIRALAX / GLYCOLAX) packet 17 g  17 g Per Tube Daily Kasa, Kurian, MD      . propofol (DIPRIVAN) 1000 MG/100ML infusion  0-50 mcg/kg/min Intravenous Continuous Kasa, Kurian, MD 11.7 mL/hr at 12/21/20 1500 15 mcg/kg/min at 12/21/20 1500  . rivaroxaban (XARELTO) tablet 10 mg  10 mg Per Tube Daily Ryan Bill, NP   10 mg at 12/21/20 1696  . saccharomyces boulardii (FLORASTOR) capsule 250 mg  250 mg Per Tube BID Ryan Bill, NP   250 mg at 12/21/20 7893  . sennosides (SENOKOT) 8.8 MG/5ML syrup 5 mL  5 mL Per Tube Q0600 Ryan Bill, NP   5 mL at 12/21/20 0640  . simethicone (MYLICON) 40 YB/0.1BP suspension 80 mg  80 mg Per Tube QID Ryan Bill, NP   80 mg at 12/21/20 1416  . sodium chloride flush (NS) 0.9 % injection 10-40 mL  10-40 mL Intracatheter PRN Ottie Glazier, MD      . traZODone (DESYREL) tablet 50 mg  50 mg Per Tube QHS Ryan Day, Ryan L, NP   50 mg at 12/20/20 2258  . vancomycin (VANCOREADY) IVPB 500 mg/100 mL  500 mg Intravenous Q12H Benita Gutter, [facility]   Stopped at 12/21/20 1025  . zinc sulfate capsule 220 mg  220 mg Per Tube Daily Ryan Bill, NP   220 mg at 12/21/20 0906    VITAL SIGNS: BP 114/68   Pulse 90   Temp 97.7 F (36.5 C)   Resp (!) 39   Ht 6' 5.01" (1.956 m)   Wt 130 kg   SpO2 96%   BMI 33.98 kg/m  Filed Weights   12/19/20 0500 12/20/20  0455 12/21/20 0500  Weight: 130 kg 130 kg 130 kg    Estimated body mass index is 33.98 kg/m as calculated from the following:   Height as of this encounter: 6' 5.01" (1.956 m).    Weight as of this encounter: 130 kg.  LABS: CBC:    Component Value Date/Time   WBC 6.8 12/21/2020 0441   HGB 7.7 (Day) 12/21/2020 0441   HGB 14.7 02/01/2020 0927   HCT 24.0 (Day) 12/21/2020 0441   HCT 44.1 02/01/2020 0927   PLT 268 12/21/2020 0441   PLT 267 02/01/2020 0927   Comprehensive Metabolic Panel:    Component Value Date/Time   NA 143 12/21/2020 0441   NA 143 02/01/2020 0927   K 3.8 12/21/2020 0441   BUN 33 (H) 12/21/2020 0441   BUN 8 02/01/2020 0927   CREATININE 0.49 (Day) 12/21/2020 0441   ALBUMIN 3.1 (Day) 12/07/2020 1725   ALBUMIN 4.4 02/01/2020 0927     Review of Systems  Unable to perform ROS: Acuity of condition  Physical Exam General: Increased work of breathing, frail chronically-ill appearing Cardiovascular: bradycardia,hypotension  Pulmonary: full ventilator support, trach, rhonchi  Abdomen: soft, nontender, + bowel sounds Extremities: no edema, no joint deformities Skin: no rashes, warm and dry, reports of sacral decub (not observed) Neurological: able to communicate via lip movement    Prognosis: POOR   Discharge Planning:  To Be Determined  Recommendations: . Full Code-as confirmed by wife, requesting ongoing discussions with family prior to final decisions despite updates and awareness patient is at high risk for sudden death/decline . Continue with current plan of care . Wife is realistic in understanding prognosis and condition. Does not wish for patient to suffer as he also has acknowledge to her. She is leaning towards DNR and comfort care measures, however would like to have family discussion and gain support and comfort from their children prior to final decisions.  Marland Kitchen PMT will continue to support and follow as needed. Please call team line with urgent needs.   Palliative Performance Scale: Full Ventilator support, Tracheostomy              Wife expressed understanding and was in agreement with this plan.   Thank you for allowing the  Palliative Medicine Team to assist in the care of this patient. Please utilize secure chat with additional questions, if there is no response within 30 minutes please call the above phone number.   Time In: 1400 Time Out: 1455 Time Total: 55 min.   Visit consisted of counseling and education dealing with the complex and emotionally intense issues of symptom management and palliative care in the setting of serious and potentially life-threatening illness.Greater than 50%  of this time was spent counseling and coordinating care related to the above assessment and plan.  Signed by:  Alda Lea, AGPCNP-BC Palliative Medicine Team  Phone: [phone] Pager: [phone] Amion: [place] Team providers are available by phone from 7am to 7pm daily and can be reached through the team cell phone.  Should this patient require assistance outside of these hours, please call the patient's attending physician.

## 2020-12-21 NOTE — Progress Notes (Signed)
GOALS OF CARE DISCUSSION  The Clinical status was relayed to family in detail.  Updated and notified of patients medical condition. Wife and Daughter at bedside  Patient is having a weak cough and struggling to remove secretions.   Patient with increased WOB and using accessory muscles to breathe Explained to family course of therapy and the modalities     Patient with Progressive multiorgan failure with a very high probablity of a very minimal chance of meaningful recovery despite all aggressive and optimal medical therapy. Process associated with Suffering.  PATIENT REMAINS FULL CODE  Family understands the situation. They will have family conference and discuss plan of care We will NOT turn or bathe patient due to decreased HR Will obtain cardiology consultation  Family are satisfied with Plan of action and management. All questions answered  Additional CC time 35 mins   Marleena Shubert Patricia Pesa, M.D.  [facility]  Medical Director [place] Director [facility] Cardio-Pulmonary Department

## 2020-12-21 NOTE — Progress Notes (Signed)
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  According to pts family last week pt noted to have sediment in his urine at home concerning for UTI.  He had a foley catheter placed 2 weeks ago and completed a 7 day course of abx therapy due to UTI.  Due to concerns of recurrent UTI pts family contacted pts PCP office last week, and heard from his PCP this week and were awaiting urine culture results.  However, pt noted to have hematuria yesterday and today along with sinus tachycardia hr 120's.  Pts daughter is a Marine scientist and due to concern of sepsis pt transported to the ER via EMS for further evaluation and treatment.    ED course Upon arrival to the ER pts O2 sats were 75-80% on RA, therefore pt placed on NRB with O2 sats initially increasing to 100%.  Pt transitioned to trach collar with initial O2 sats in the low 90's.  However, pt noted to have mucous plugging resulting in hypoxia requiring exchange of tracheostomy from a cuffless trach to a size 6 mm cuffed trach and pt placed on mechanical ventilation.  ER vital signs were: temp 101.7 rectally, sbp 60-80's, and hr 130's. Lab results revealed Na+ 153, chloride 119, glucose 147, lactic acid 2.8, wbc 2.0, hbg 11.1, and UA positive for UTI.  CXR negative and COVID-19/Influenza PCR results pending.  Sepsis protocol initiated and pt received cefepime, vancomycin, and 2L LR bolus.  Pt remained severely hypotensive requiring levophed gtt and right femoral central line placement.  PCCM team contacted for ICU admission.    12/08/20- patient is improved some he is communicative.  Wife [name] at bedside we reviewed care plan.   12/10/20- patient is improved he is down to 80mg on levophed and 30%Fio2 on ventilator, mentating well. Reviewed careplan with wife  LVaughan Bastatoday.  12/11/20 PICC 12/12/20 Mucus plug, cleared with PPV-ambu 12/17/2020 increased FiO2 requirements, increased work of breathing.  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 C5 Cervical Fracture (following a mechanical fall at home) s/p C3-6 Laminectomy and Posterior Instrumentation and Fusion 08/4  Quadriplegia  Spinal Cord Compression  Chronic Tracheostomy and PEG    Significant Hospital Events: Including procedures, antibiotic start and stop dates in addition to other pertinent events   03/17: Pt admitted to ICU with urosepsis requiring vasopressors and acute on chronic hypoxic respiratory failure secondary to mucous plugging requiring exchange of cuffless trach to size 6 mm cuffed trach to be placed on mechanical ventilation  03/17: Right femoral central line placed by ER physician  03/17: CT Abd Pelvis findings with cystitis. Correlation with urinalysis is Recommended. Mild to moderate severity bibasilar atelectasis and/or Infiltrate. Sigmoid diverticulosis. Sacral decubitus ulcer, as described above. MRI correlation is recommended, as sequelae associated with acute osteomyelitis cannot be excluded. Aortic atherosclerosis. 03/17: CT Head revealed mild chronic ischemic white matter disease. No acute intracranial abnormality seen 03/17: Vancomycin x1 dose and Cefepime x1 dose 03/17: Zosyn>>completed 03/27: On Cefepime 3/30 severe bradycardia with decrease in PS mode  Micro 12/07/2020 blood culture has  methicillin-resistant staph aureus, Enterobacter cloacae and Enterococcus faecalis  12/13/2020 blood culture: No growth so far  12/08/2020  bronchoalveolar lavage more than 100,000 colonies of Pseudomonas  aeruginosa Methicillin-resistant staph aureus 40,000 colonies Enterococcus faecalis 20,000.  12/07/2020 urine culture Enterobacter cloacae   Sacral wound has rare Pseudomonas aeruginosa rare methicillin-resistant staph aureus few Bacteroides and rare Candida albicans. Likely stool contamination   Anti-infectives (From admission, onward)   Start     Dose/Rate Route Frequency Ordered Stop   12/20/20 2000  vancomycin (VANCOREADY) IVPB 500 mg/100 mL        500 mg 100 mL/hr over 60 Minutes Intravenous Every 12 hours 12/20/20 1709     12/17/20 1400  vancomycin (VANCOREADY) IVPB 750 mg/150 mL  Status:  Discontinued        750 mg 150 mL/hr over 60 Minutes Intravenous Every 12 hours 12/17/20 1159 12/20/20 0604   12/13/20 1000  vancomycin (VANCOREADY) IVPB 1000 mg/200 mL  Status:  Discontinued        1,000 mg 200 mL/hr over 60 Minutes Intravenous Every 12 hours 12/13/20 0832 12/17/20 1159   12/12/20 1800  ciprofloxacin (CIPRO) IVPB 400 mg        400 mg 200 mL/hr over 60 Minutes Intravenous Every 8 hours 12/12/20 1620     12/12/20 1700  ciprofloxacin (CIPRO) IVPB 400 mg  Status:  Discontinued        400 mg 200 mL/hr over 60 Minutes Intravenous Every 12 hours 12/12/20 1609 12/12/20 1620   12/12/20 1615  vancomycin variable dose per unstable renal function (pharmacist dosing)  Status:  Discontinued         Does not apply See admin instructions 12/12/20 1615 12/13/20 1050   12/12/20 1100  vancomycin (VANCOREADY) IVPB 1750 mg/350 mL        1,750 mg 175 mL/hr over 120 Minutes Intravenous  Once 12/12/20 0957 12/12/20 1301   12/12/20 0600  ceFEPIme (MAXIPIME) 2 g in sodium chloride 0.9 % 100 mL IVPB        2 g 200 mL/hr over 30 Minutes Intravenous Every 8 hours 12/11/20 1929     12/11/20 1522  vancomycin variable dose per unstable renal function (pharmacist dosing)  Status:  Discontinued         Does not apply See admin instructions 12/11/20 1522 12/12/20 1607   12/11/20 0900  vancomycin  (VANCOREADY) IVPB 1000 mg/200 mL  Status:  Discontinued        1,000 mg 200 mL/hr over 60 Minutes Intravenous Every 12 hours 12/11/20 0813 12/11/20 1522   12/10/20 1515  meropenem (MERREM) 1 g in sodium chloride 0.9 % 100 mL IVPB        1 g 200 mL/hr over 30 Minutes Intravenous Every 8 hours 12/10/20 1418 12/12/20 0834   12/09/20 1000  ceFEPIme (MAXIPIME) 2 g in sodium chloride 0.9 % 100 mL IVPB  Status:  Discontinued        2 g 200 mL/hr over 30 Minutes Intravenous Every 8 hours 12/09/20 0811 12/10/20 1418   12/09/20 0730  vancomycin (VANCOREADY) IVPB 2000 mg/400 mL        2,000 mg 200 mL/hr over 120 Minutes Intravenous  Once 12/09/20 0644 12/09/20 1616   12/09/20 0600  vancomycin (VANCOREADY) IVPB 1750 mg/350 mL  Status:  Discontinued        1,750 mg 175 mL/hr over 120 Minutes Intravenous Every 24 hours 12/08/20 0809 12/08/20 1229   12/08/20 2200  ceFEPIme (MAXIPIME) 2 g in sodium  chloride 0.9 % 100 mL IVPB  Status:  Discontinued        2 g 200 mL/hr over 30 Minutes Intravenous Every 12 hours 12/08/20 0809 12/09/20 0811   12/08/20 1700  vancomycin (VANCOREADY) IVPB 1500 mg/300 mL  Status:  Discontinued        1,500 mg 150 mL/hr over 120 Minutes Intravenous Every 12 hours 12/08/20 0500 12/08/20 0809   12/08/20 1230  vancomycin variable dose per unstable renal function (pharmacist dosing)  Status:  Discontinued         Does not apply See admin instructions 12/08/20 1230 12/11/20 1359   12/08/20 0545  ceFEPIme (MAXIPIME) 2 g in sodium chloride 0.9 % 100 mL IVPB  Status:  Discontinued        2 g 200 mL/hr over 30 Minutes Intravenous Every 8 hours 12/08/20 0446 12/08/20 0809   12/08/20 0530  vancomycin (VANCOREADY) IVPB 2000 mg/400 mL       "Followed by" Linked Group Details   2,000 mg 200 mL/hr over 120 Minutes Intravenous  Once 12/08/20 0441 12/08/20 0815   12/08/20 0530  vancomycin (VANCOREADY) IVPB 500 mg/100 mL  Status:  Discontinued       "Followed by" Linked Group Details   500  mg 100 mL/hr over 60 Minutes Intravenous  Once 12/08/20 0441 12/08/20 0803   12/07/20 2215  piperacillin-tazobactam (ZOSYN) IVPB 3.375 g  Status:  Discontinued        3.375 g 12.5 mL/hr over 240 Minutes Intravenous Every 8 hours 12/07/20 2124 12/08/20 0447   12/07/20 1800  ceFEPIme (MAXIPIME) 2 g in sodium chloride 0.9 % 100 mL IVPB        2 g 200 mL/hr over 30 Minutes Intravenous  Once 12/07/20 1757 12/07/20 2036   12/07/20 1800  vancomycin (VANCOCIN) IVPB 1000 mg/200 mL premix        1,000 mg 200 mL/hr over 60 Minutes Intravenous  Once 12/07/20 1757 12/07/20 1942       Interim History / Subjective:  Lethargic In vent Increased PS mode +secretions Severe bradycardia last 24 hrs    Objective   Blood pressure (!) 104/59, pulse 76, temperature (!) 97.16 F (36.2 C), resp. rate (!) 23, height 6' 5.01" (1.956 m), weight 130 kg, SpO2 95 %.    Vent Mode: PSV FiO2 (%):  [21 %-30 %] 30 % PEEP:  [5 cmH20] 5 cmH20 Pressure Support:  [5 cmH20-15 cmH20] 15 cmH20 Plateau Pressure:  [10 cmH20] 10 cmH20   Intake/Output Summary (Last 24 hours) at 12/21/2020 0719 Last data filed at 12/21/2020 0640 Gross per 24 hour  Intake 800 ml  Output 3190 ml  Net -2390 ml   Filed Weights   12/19/20 0500 12/20/20 0455 12/21/20 0500  Weight: 130 kg 130 kg 130 kg    REVIEW OF SYSTEMS  PATIENT IS UNABLE TO PROVIDE COMPLETE REVIEW OF SYSTEMS DUE TO SEVERE CRITICAL ILLNESS   PHYSICAL EXAMINATION:  GENERAL:critically ill appearing, +resp distress s/p trach  HEAD: Normocephalic, atraumatic.  EYES: Pupils equal, round, reactive to light.  No scleral icterus.  MOUTH: Moist mucosal membrane. NECK: Supple. No thyromegaly. No nodules. No JVD.  PULMONARY: +rhonchi,  CARDIOVASCULAR: S1 and S2. Regular rate and rhythm. No murmurs, rubs, or gallops.  GASTROINTESTINAL: Soft, nontender, -distended. Positive bowel sounds.  MUSCULOSKELETAL:+edema.  NEUROLOGIC: lethargic SKIN:+sacral decub          Labs/imaging personally reviewed   CT Abd/Pelvis CXR CT Head  UA  Resolved Hospital Problem list  N/A  Assessment & Plan:   69 yo quadriplegic with multiple mecial issues with acute hypoxic resp failure due to severe polymicrobial sepsis from pseudomonal infections of lungs and sacral decub    Severe ACUTE Hypoxic and Hypercapnic Respiratory Failure Chronic trach and PEG tube on vent acute on chronic resp failure -continue Mechanical Ventilator support -continue Bronchodilator Therapy -Wean Fio2 and PEEP as tolerated -VAP/VENT bundle implementation +sepsis and mucus plugging S/p bronch - many GPCs , cleared bilateral airways  He did NOT tolerate PS 5/5 mode  ACUTE DIASTOLIC CARDIAC FAILURE- Afib with RVR -oxygen as needed -Lasix as tolerated  Septic shock -use vasopressors to keep MAP>65 as needed  INFECTIOUS DISEASE -continue antibiotics as prescribed -follow up cultures -follow up ID consultation  ACUTE KIDNEY INJURY/Renal Failure -continue Foley Catheter-assess need -Avoid nephrotoxic agents -Follow urine output, BMP -Ensure adequate renal perfusion, optimize oxygenation -Renal dose medications   Intake/Output Summary (Last 24 hours) at 12/21/2020 0719 Last data filed at 12/21/2020 1102 Gross per 24 hour  Intake 800 ml  Output 3190 ml  Net -2390 ml     NEUROLOGY Quadriplegia-bed bound    Best practice (evaluated daily)  Diet:  NPO Pain/Anxiety/Delirium protocol (if indicated): Yes (RASS goal 0) VAP protocol (if indicated): Yes DVT prophylaxis: Systemic AC GI prophylaxis: H2B Glucose control:  SSI No Central venous access:  Yes, and it is still needed Arterial line:  N/A Foley:  Yes, and it is still needed Mobility:  bed rest  PT consulted: N/A Last date of multidisciplinary goals of care discussion [N/A]  Code Status:  full code Disposition: ICU      Labs   CBC: Recent Labs  Lab 12/15/20 0343 12/16/20 0450 12/17/20 0444  12/18/20 0524 12/19/20 0515 12/20/20 0430 12/21/20 0441  WBC 14.6*   < > 8.7 9.2 8.0 7.4 6.8  NEUTROABS 11.6*  --   --   --   --   --   --   HGB 6.9*   < > 7.2* 7.6* 8.0* 8.1* 7.7*  HCT 22.6*   < > 22.7* 23.9* 25.2* 24.9* 24.0*  MCV 94.6   < > 90.8 89.5 88.7 88.6 90.6  PLT 156   < > 158 211 263 273 268   < > = values in this interval not displayed.    Basic Metabolic Panel: Recent Labs  Lab 12/17/20 0623 12/18/20 0524 12/19/20 0515 12/20/20 0430 12/21/20 0441  NA 140 138 139 142 143  K 3.7 3.2* 3.5 3.4* 3.8  CL 107 102 104 108 108  CO2 _0 GLUCOSE 169* 113* 138* 146* 133*  BUN 32* 31* 34* 32* 33*  CREATININE 0.57* 0.51* 0.56* 0.55* 0.49*  CALCIUM 9.1 9.2 9.3 9.3 9.5  MG 2.0 1.9 2.1 2.0 2.3  PHOS 2.5 2.5 1.9* 2.5 2.4*   GFR: Estimated Creatinine Clearance: 131.9 mL/min (A) (by C-G formula based on SCr of 0.49 mg/dL (L)). Recent Labs  Lab 12/18/20 0524 12/19/20 0515 12/20/20 0430 12/21/20 0441  WBC 9.2 8.0 7.4 6.8    Liver Function Tests: No results for input(s): AST, ALT, ALKPHOS, BILITOT, PROT, ALBUMIN in the last 168 hours. No results for input(s): LIPASE, AMYLASE in the last 168 hours. No results for input(s): AMMONIA in the last 168 hours.  ABG    Component Value Date/Time   PHART 7.38 12/08/2020 0500   PCO2ART 42 12/08/2020 0500   PO2ART 115 (H) 12/08/2020 0500   HCO3 24.8 12/08/2020 0500  ACIDBASEDEF 0.4 12/08/2020 0500   O2SAT 98.4 12/08/2020 0500     Coagulation Profile: No results for input(s): INR, PROTIME in the last 168 hours.  Cardiac Enzymes: No results for input(s): CKTOTAL, CKMB, CKMBINDEX, TROPONINI in the last 168 hours.  HbA1C: Hgb A1c MFr Bld  Date/Time Value Ref Range Status  06/09/2020 07:00 AM 5.8 (H) 4.8 - 5.6 % Final    Comment:    (NOTE) Pre diabetes:          5.7%-6.4%  Diabetes:              >6.4%  Glycemic control for   <7.0% adults with diabetes     CBG: Recent Labs  Lab 12/20/20 1125  12/20/20 1508 12/20/20 1917 12/20/20 2308 12/21/20 0430  GLUCAP 132* 145* 135* 137* 123*    Review of Systems:   Unable to assess pt chronically trached currently requiring mechanical ventilation   Allergies Allergies  Allergen Reactions  . Shrimp (Diagnostic)     Experiences facial droop when eating seafood     Scheduled Meds: . ascorbic acid  500 mg Per Tube BID  . baclofen  10 mg Per Tube QID  . chlorhexidine gluconate (MEDLINE KIT)  15 mL Mouth Rinse BID  . Chlorhexidine Gluconate Cloth  6 each Topical Daily  . diclofenac Sodium  4 g Topical QID  . feeding supplement (PROSource TF)  45 mL Per Tube Daily  . ferrous sulfate  75 mg Per Tube Daily  . fludrocortisone  200 mcg Per Tube Daily  . FLUoxetine  40 mg Per Tube Daily  . free water  200 mL Per Tube Q4H  . guaiFENesin  400 mg Per Tube Q6H  . insulin aspart  0-15 Units Subcutaneous Q4H  . ipratropium-albuterol  3 mL Nebulization TID  . mouth rinse  15 mL Mouth Rinse 10 times per day  . nutrition supplement (JUVEN)  1 packet Per Tube BID BM  . polycarbophil  625 mg Per Tube Daily  . rivaroxaban  10 mg Per Tube Daily  . saccharomyces boulardii  250 mg Per Tube BID  . sennosides  5 mL Per Tube Q0600  . simethicone  80 mg Per Tube QID  . traZODone  50 mg Per Tube QHS  . zinc sulfate  220 mg Per Tube Daily   Continuous Infusions: . ceFEPime (MAXIPIME) IV 2 g (12/21/20 0549)  . ciprofloxacin 400 mg (12/21/20 3491)  . famotidine (PEPCID) IV 20 mg (12/20/20 2220)  . feeding supplement (PIVOT 1.5 CAL) 1,000 mL (12/20/20 2326)  . vancomycin Stopped (12/20/20 2147)   PRN Meds:.acetaminophen, bisacodyl, docusate sodium, fentaNYL (SUBLIMAZE) injection, midazolam, ondansetron (ZOFRAN) IV, polyethylene glycol, sodium chloride flush      DVT/GI PRX  assessed I Assessed the need for Labs I Assessed the need for Foley I Assessed the need for Central Venous Line Family Discussion when available I Assessed the need for  Mobilization I made an Assessment of medications to be adjusted accordingly Safety Risk assessment completed  CASE DISCUSSED IN MULTIDISCIPLINARY ROUNDS WITH ICU TEAM     Critical Care Time devoted to patient care services described in this note is 55 minutes.   Overall, patient is critically ill, prognosis is guarded.  Patient with Multiorgan failure and at high risk for cardiac arrest and death.    Corrin Parker, M.D.  [facility]  Medical Director [place] Director [facility] Cardio-Pulmonary Department

## 2020-12-21 NOTE — Consult Note (Signed)
[place] for Electrolyte Monitoring and Replacement   Recent Labs: Potassium (mmol/L)  Date Value  12/21/2020 3.8   Magnesium (mg/dL)  Date Value  12/21/2020 2.3   Calcium (mg/dL)  Date Value  12/21/2020 9.5   Albumin (g/dL)  Date Value  12/07/2020 3.1 (L)  02/01/2020 4.4   Phosphorus (mg/dL)  Date Value  12/21/2020 2.4 (L)   Sodium (mmol/L)  Date Value  12/21/2020 143  02/01/2020 143    Assessment: Patient is a 70 y/o M presented with sepsis. Pt presented with complaints of recurrent UTI and hematuria. 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 Pt has chronic tracheostomy and PEG tube. PMH includes HTN and HLD. Pharmacy has been consulted to assist with electrolyte monitoring and replacement as indicated.   Scr now around baseline (~0.5):  0.51 > 0.56 > 0.55 > 0.49   Nutrition: tube feeds @ 70 mL/hr, prosource tube feeds once daily   MIVF: Free water at 200 mL Q4h (1200 mL/day)   Goal of Therapy:  Electrolytes within normal limits  Plan:  -- Na 142: Continue free water 200 per tube q4h (1200 mL/day)       -- Follow-up electrolytes with AM labs  Doreatha Massed, Student-PharmD 12/21/2020

## 2020-12-22 ENCOUNTER — Inpatient Hospital Stay: Payer: Medicare Other

## 2020-12-22 DIAGNOSIS — G8250 Quadriplegia, unspecified: Secondary | ICD-10-CM | POA: Diagnosis not present

## 2020-12-22 DIAGNOSIS — R7881 Bacteremia: Secondary | ICD-10-CM | POA: Diagnosis not present

## 2020-12-22 DIAGNOSIS — L89154 Pressure ulcer of sacral region, stage 4: Secondary | ICD-10-CM | POA: Diagnosis not present

## 2020-12-22 DIAGNOSIS — R6521 Severe sepsis with septic shock: Secondary | ICD-10-CM | POA: Diagnosis not present

## 2020-12-22 DIAGNOSIS — A419 Sepsis, unspecified organism: Secondary | ICD-10-CM | POA: Diagnosis not present

## 2020-12-22 DIAGNOSIS — J9602 Acute respiratory failure with hypercapnia: Secondary | ICD-10-CM

## 2020-12-22 DIAGNOSIS — J9601 Acute respiratory failure with hypoxia: Secondary | ICD-10-CM | POA: Diagnosis not present

## 2020-12-22 DIAGNOSIS — J9621 Acute and chronic respiratory failure with hypoxia: Secondary | ICD-10-CM | POA: Diagnosis not present

## 2020-12-22 DIAGNOSIS — B9562 Methicillin resistant Staphylococcus aureus infection as the cause of diseases classified elsewhere: Secondary | ICD-10-CM | POA: Diagnosis not present

## 2020-12-22 LAB — CBC
HCT: 23.8 % — ABNORMAL LOW (ref 39.0–52.0)
Hemoglobin: 7.5 g/dL — ABNORMAL LOW (ref 13.0–17.0)
MCH: 28.8 pg (ref 26.0–34.0)
MCHC: 31.5 g/dL (ref 30.0–36.0)
MCV: 91.5 fL (ref 80.0–100.0)
Platelets: 272 10*3/uL (ref 150–400)
RBC: 2.6 MIL/uL — ABNORMAL LOW (ref 4.22–5.81)
RDW: 17.1 % — ABNORMAL HIGH (ref 11.5–15.5)
WBC: 7.3 10*3/uL (ref 4.0–10.5)
nRBC: 0 % (ref 0.0–0.2)

## 2020-12-22 LAB — BASIC METABOLIC PANEL
Anion gap: 4 — ABNORMAL LOW (ref 5–15)
BUN: 37 mg/dL — ABNORMAL HIGH (ref 8–23)
CO2: 31 mmol/L (ref 22–32)
Calcium: 9.6 mg/dL (ref 8.9–10.3)
Chloride: 110 mmol/L (ref 98–111)
Creatinine, Ser: 0.64 mg/dL (ref 0.61–1.24)
GFR, Estimated: >60 mL/min (ref >60)
Glucose, Bld: 145 mg/dL — ABNORMAL HIGH (ref 70–99)
Potassium: 3.5 mmol/L (ref 3.5–5.1)
Sodium: 145 mmol/L (ref 135–145)

## 2020-12-22 LAB — GLUCOSE, CAPILLARY
Glucose-Capillary: 142 mg/dL — ABNORMAL HIGH (ref 70–99)
Glucose-Capillary: 108 mg/dL — ABNORMAL HIGH (ref 70–99)
Glucose-Capillary: 108 mg/dL — ABNORMAL HIGH (ref 70–99)
Glucose-Capillary: 117 mg/dL — ABNORMAL HIGH (ref 70–99)
Glucose-Capillary: 132 mg/dL — ABNORMAL HIGH (ref 70–99)
Glucose-Capillary: 100 mg/dL — ABNORMAL HIGH (ref 70–99)

## 2020-12-22 LAB — VANCOMYCIN, PEAK: Vancomycin Pk: 21 ug/mL — ABNORMAL LOW (ref 30–40)

## 2020-12-22 LAB — VANCOMYCIN, TROUGH: Vancomycin Tr: 17 ug/mL (ref 15–20)

## 2020-12-22 LAB — PHOSPHORUS: Phosphorus: 2.9 mg/dL (ref 2.5–4.6)

## 2020-12-22 LAB — MAGNESIUM: Magnesium: 2.5 mg/dL — ABNORMAL HIGH (ref 1.7–2.4)

## 2020-12-22 LAB — TRIGLYCERIDES: Triglycerides: 71 mg/dL (ref <150)

## 2020-12-22 IMAGING — DX DG ABDOMEN 1V
2 series · 2 of 2 positions shown · non-contrast
Comparison: Abdomen and pelvis CT [DATE]

CLINICAL DATA: Abdominal distension

EXAM:
ABDOMEN - 1 VIEW

[abdomen supine (1 of 2)]
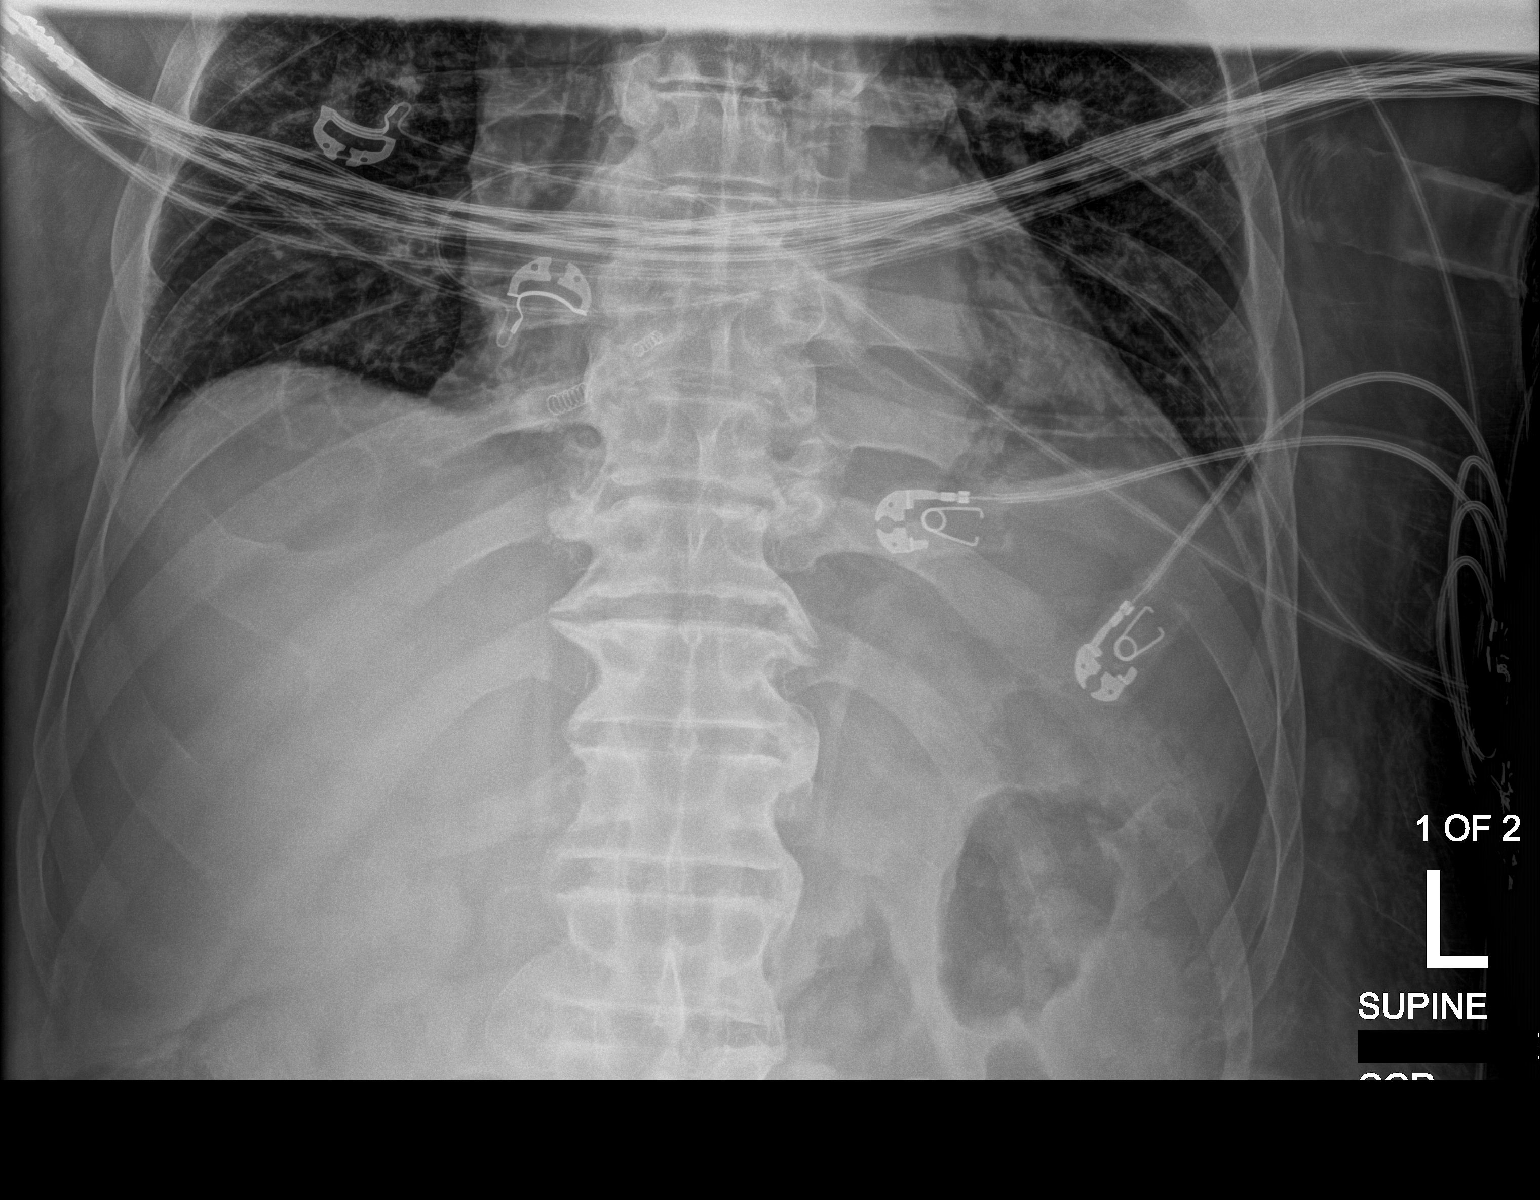

[abdomen supine (2 of 2)]
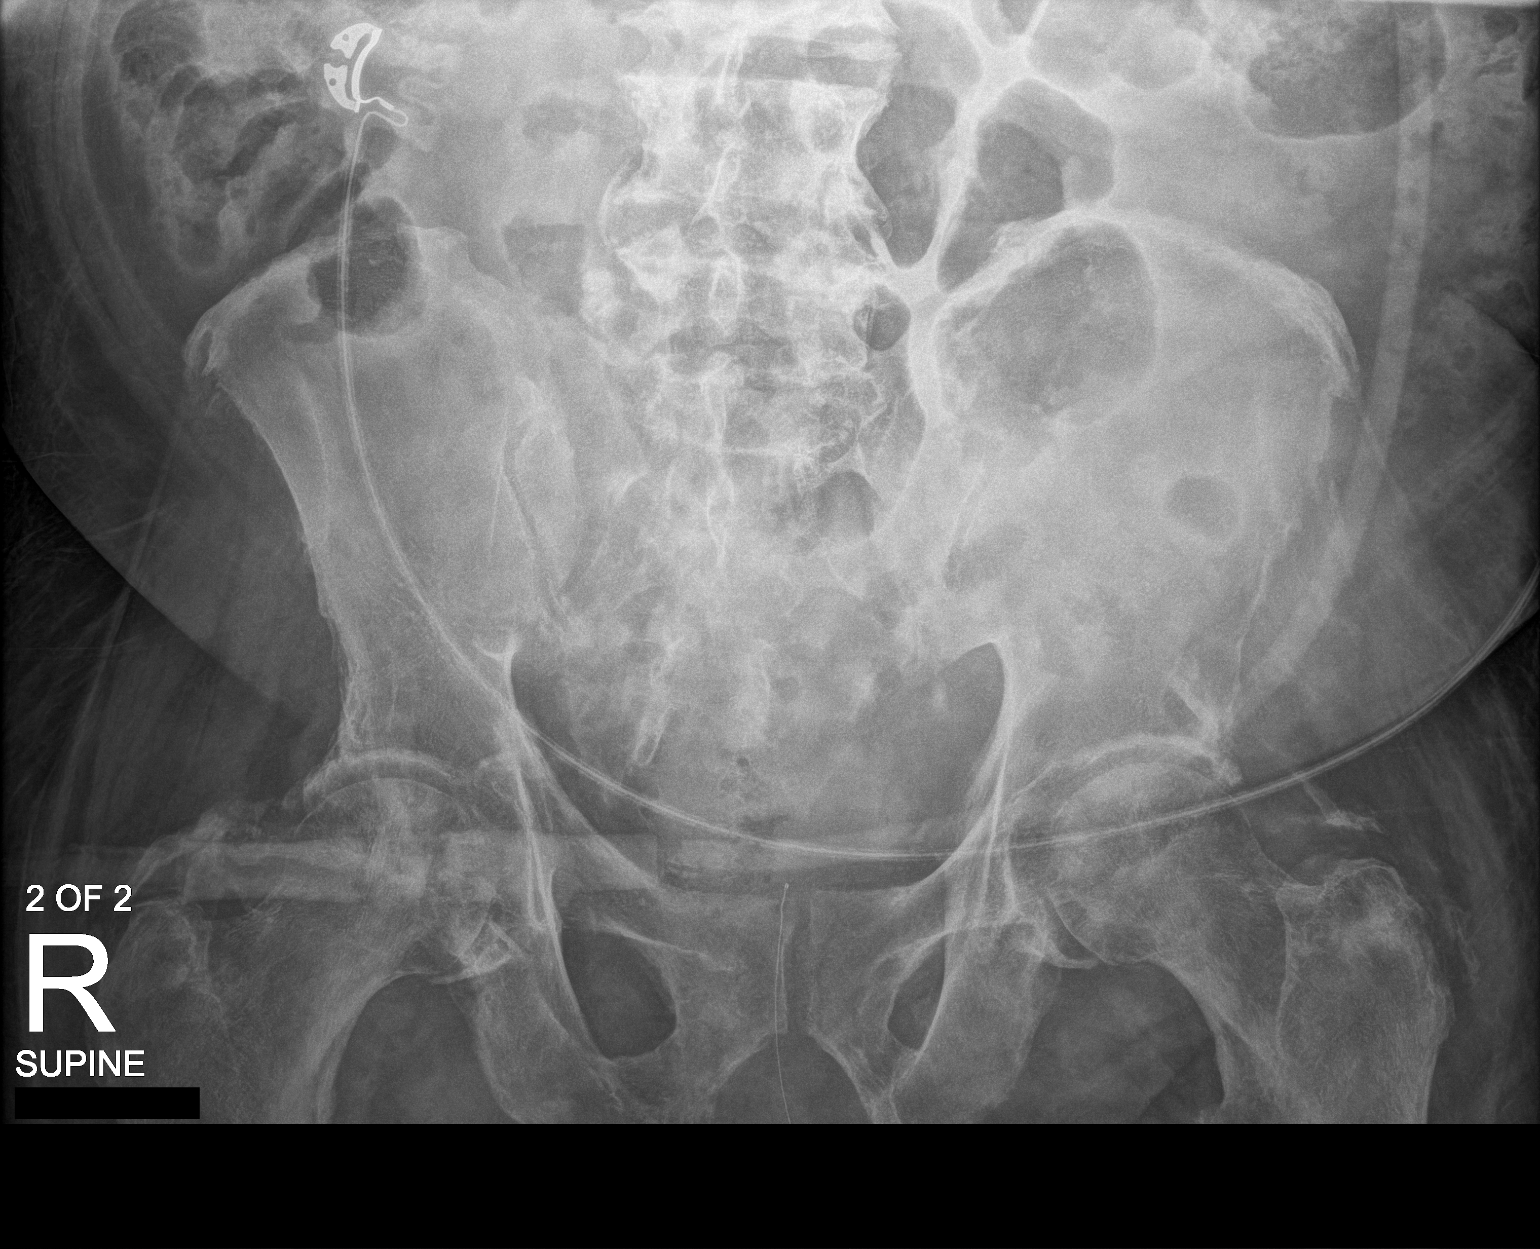

[2 of 2 positions shown; findings below may reference images not displayed]

FINDINGS: Normal bowel gas pattern.

Percutaneous gastrostomy tube.

Normal bowel gas pattern.  No abnormal stool retention.

No acute finding in the lower chest.
IMPRESSION: Normal bowel gas pattern.

## 2020-12-22 MED ORDER — FUROSEMIDE 10 MG/ML IJ SOLN
20.0000 mg | Freq: Two times a day (BID) | INTRAMUSCULAR | Status: DC
  Administered 2020-12-22 – 2020-12-25 (×8): 20 mg via INTRAVENOUS
  Filled 2020-12-22 (×8): qty 2

## 2020-12-22 MED ORDER — POTASSIUM CHLORIDE 20 MEQ PO PACK
40.0000 meq | PACK | Freq: Once | ORAL | Status: AC
  Administered 2020-12-22: 40 meq
  Filled 2020-12-22: qty 2

## 2020-12-22 NOTE — Progress Notes (Signed)
GOALS OF CARE DISCUSSION  The Clinical status was relayed to family in detail. Wife Ryan Day  Updated and notified of patients medical condition.  Patient remains unresponsive and will not open eyes to command.    Patient is having a weak cough and struggling to remove secretions.   Patient with increased WOB and using accessory muscles to breathe Explained to family course of therapy and the modalities     Patient with Progressive multiorgan failure with a very high probablity of a very minimal chance of meaningful recovery despite all aggressive and optimal medical therapy. Patient is in the Dying  Process associated with Suffering.  PATIENT REMAINS FULL CODE    Family are satisfied with Plan of action and management. All questions answered  Additional CC time 35 mins   Ryan Day, M.D.  [facility]  Medical Director [place] Director [facility] Cardio-Pulmonary Department

## 2020-12-22 NOTE — Progress Notes (Signed)
Pharmacy Antibiotic Note  Ryan Day is a 70 y.o. male admitted on 12/07/2020 with sepsis. Pt presented with complaints of recurrent UTI and hematuria with foley cath last placed ~2 weeks ago, exchanged in ED. 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 Pt has PEG and trache. Pt admitted 11/22-12/5 for HCAP and sacral ulcer with osteomyelitis. Hx of enterobacter cloacae, pseudomonas in ucx. Pt has received vanc, Unasyn, Zosyn, and cefepime on recent previous admissions. ID consulted. Pt with polymicrobial bacteremia with sepsis resulting from multiple sources including UTI, PNA, and sacral wound. Meropenem was discontinued 3/22 based on sensitivity to ceftazidime from BAL. IV Cipro was added due to increased secretions. Pharmacy has been consulted for vancomycin dosing.   Day 16 abx  Cipro d/c'ed 3/31 (11 days)   Vancomycin 500 mg q12h started 3/30 after renal adjustment; expected AUC: 567, Cmin 12.4 Vancomycin peak 4/1 taken at 1147 = 21 (3 hours after end of infusion)    Plan: -- Continue Cefepime 2 grams q8h   -- Continue vancomycin 500 mg q12h, adjusting dose based on peak and trough (trough ordered 4/1 at 1900) -- Daily Scr per protocol      Height: 6' 5.01" (195.6 cm) Weight: 130 kg (286 lb 9.6 oz) IBW/kg (Calculated) : 89.12   Temp (24hrs), Avg:96.5 F (35.8 C), Min:95 F (35 C), Max:97.88 F (36.6 C)  Recent Labs  Lab 12/18/20 0524 12/19/20 0515 12/19/20 0625 12/19/20 1300 12/20/20 0430 12/20/20 0500 12/20/20 1330 12/21/20 0441 12/22/20 0433 12/22/20 1147  WBC 9.2 8.0  --   --  7.4  --   --  6.8 7.3  --   CREATININE 0.51* 0.56*  --   --  0.55*  --   --  0.49* 0.64  --   VANCOTROUGH  --   --   --  23*  --   --  21*  --   --   --   VANCOPEAK  --   --    < >  --   --  50*  --   --   --  21*   < > = values in this interval not displayed.    Estimated Creatinine Clearance: 131.9 mL/min (by C-G formula based on SCr of 0.64 mg/dL).     Allergies  Allergen Reactions  . Shrimp (Diagnostic)     Experiences facial droop when eating seafood    Antimicrobials this admission: 3/17 Vancomycin  >>  3/17 Cefepime  >> 3/20; 3/22 >> 3/17 Zosyn x 1  3/20 Meropenem >> 3/21  3/22 Cipro >> 3/31  Dose adjustments this admission: 3/27 Vanc 1000 mg q12h to 750 mg q12h 3/30 Vanc 750 mg q12h to 500 mg q12h  Microbiology results: 3/17 MRSA PCR: positive 3/17 BCx: E cloacae, MRSA, E faecalis 3/17 UCx: E cloacae 3/18 Resp cx: PSA 3/18 BAL: PSA (ceftazidime sensitive), MRSA, E faecalis 3/21 Sacral cx: MRSA, PSA 3/23 BCx: NGTD 3/27 Respiratory Cx: PSA (gentamicin sensitive)  Thank you for allowing pharmacy to be a part of this patient's care.  Doreatha Massed, Student-PharmD 12/22/2020

## 2020-12-22 NOTE — Progress Notes (Signed)
Date of Admission:  12/07/2020     ID: Ryan Day is a 70 y.o. male  Active Problems:   Sepsis ([place])    Subjective:  Family at bed side  Medications:  . ascorbic acid  500 mg Per Tube BID  . baclofen  10 mg Per Tube QID  . chlorhexidine gluconate (MEDLINE KIT)  15 mL Mouth Rinse BID  . Chlorhexidine Gluconate Cloth  6 each Topical Daily  . diclofenac Sodium  4 g Topical QID  . docusate  100 mg Per Tube BID  . feeding supplement (PROSource TF)  45 mL Per Tube Daily  . ferrous sulfate  75 mg Per Tube Daily  . FLUoxetine  40 mg Per Tube Daily  . free water  200 mL Per Tube Q4H  . furosemide  20 mg Intravenous BID  . guaiFENesin  400 mg Per Tube Q6H  . insulin aspart  0-15 Units Subcutaneous Q4H  . ipratropium-albuterol  3 mL Nebulization TID  . mouth rinse  15 mL Mouth Rinse 10 times per day  . midodrine  5 mg Per Tube BID WC  . nutrition supplement (JUVEN)  1 packet Per Tube BID BM  . polycarbophil  625 mg Per Tube Daily  . polyethylene glycol  17 g Per Tube Daily  . potassium chloride  40 mEq Per Tube Once  . rivaroxaban  10 mg Per Tube Daily  . saccharomyces boulardii  250 mg Per Tube BID  . sennosides  5 mL Per Tube Q0600  . simethicone  80 mg Per Tube QID  . traZODone  50 mg Per Tube QHS  . zinc sulfate  220 mg Per Tube Daily    Objective: Vital signs in last 24 hours: Temp:  [95.36 F (35.2 C)-98.06 F (36.7 C)] 95.36 F (35.2 C) (04/01 0900) Pulse Rate:  [37-90] 68 (04/01 0900) Resp:  [16-39] 16 (04/01 0600) BP: (88-130)/(50-92) 130/92 (04/01 0900) SpO2:  [86 %-99 %] 97 % (04/01 0900) FiO2 (%):  [30 %-40 %] 40 % (04/01 0807)  PHYSICAL EXAM:  General: lying with eyes closed- on calling he opens eyes Tracheostomy B/l air entry Heart: Regular rate and rhythm, no murmur, rub or gallop. Abdomen: Soft, peg Extremities: edema extremities Skin:sacral wound B/l heel eschars Lymph: Cervical, supraclavicular normal. Neurologic: quadriplegia  Lab  Results Recent Labs    12/21/20 0441 12/22/20 0433  WBC 6.8 7.3  HGB 7.7* 7.5*  HCT 24.0* 23.8*  NA 143 145  K 3.8 3.5  CL 108 110  CO2 30 31  BUN 33* 37*  CREATININE 0.49* 0.64  Microbiology: 12/07/2020 blood culture has methicillin-resistant staph aureus, Enterobacter cloacae and Enterococcus faecalis  12/13/2020 blood culture: No growth so far  12/08/2020 bronchoalveolar lavage more than 100,000 colonies of Pseudomonas aeruginosa Methicillin-resistant staph aureus 40,000 colonies Enterococcus faecalis 20,000.  12/07/2020 urine culture Enterobacter cloacae  Sacral wound has rare Pseudomonas aeruginosa rare methicillin-resistant staph  Studies/Results: DG Abd 1 View  Result Date: 12/22/2020 CLINICAL DATA:  Abdominal distension EXAM: ABDOMEN - 1 VIEW COMPARISON:  Abdomen and pelvis CT 12/07/2020 FINDINGS: Normal bowel gas pattern. Percutaneous gastrostomy tube. Normal bowel gas pattern.  No abnormal stool retention. No acute finding in the lower chest. IMPRESSION: Normal bowel gas pattern. Electronically Signed   By: Monte Fantasia M.D.   On: 12/22/2020 09:56   US Venous Img Upper Uni Right(DVT)  Result Date: 12/20/2020 CLINICAL DATA:  70 year old male with swelling in the arm and presence of a PICC  EXAM: RIGHT UPPER EXTREMITY VENOUS DOPPLER ULTRASOUND TECHNIQUE: Gray-scale sonography with graded compression, as well as color Doppler and duplex ultrasound were performed to evaluate the upper extremity deep venous system from the level of the subclavian vein and including the jugular, axillary, basilic, radial, ulnar and upper cephalic vein. Spectral Doppler was utilized to evaluate flow at rest and with distal augmentation maneuvers. COMPARISON:  None. FINDINGS: Contralateral Subclavian Vein: Respiratory phasicity is normal and symmetric with the symptomatic side. No evidence of thrombus. Normal compressibility. Internal Jugular Vein: No evidence of thrombus. Normal compressibility,  respiratory phasicity and response to augmentation. Subclavian Vein: No evidence of thrombus. Normal compressibility, respiratory phasicity and response to augmentation. Axillary Vein: No evidence of thrombus. Normal compressibility, respiratory phasicity and response to augmentation. Cephalic Vein: No evidence of thrombus. Normal compressibility, respiratory phasicity and response to augmentation. Basilic Vein: No evidence of thrombus. Normal compressibility, respiratory phasicity and response to augmentation. Brachial Veins: No evidence of thrombus. Normal compressibility, respiratory phasicity and response to augmentation. Radial Veins: No evidence of thrombus. Normal compressibility, respiratory phasicity and response to augmentation. Ulnar Veins: No evidence of thrombus. Normal compressibility, respiratory phasicity and response to augmentation. Other Findings:  A PICC is present, incompletely visualized. IMPRESSION: Sonographic survey right upper extremity negative for DVT Electronically Signed   By: Corrie Mckusick D.O.   On: 12/20/2020 16:56     Assessment/Plan: Polymicrobial bacteremia with sepsis. MRSA, E faecalis, Enterobacter cloacae in blood culture. Pseudomonas in in BAL culture.  Has bilateral pulmonary infiltrates Urine culture had Enterobacter.  He has Foley catheter Continue IV vanco and IV cefepime . Duration will depend on his progress- may need upto 4 weeks   Acute on chronic hypoxic respiratory failure secondary to frequent mucous plugging.  Bilateral pulmonary infiltrates being treated as HCAP.  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  PEG in place  Neurogenic bladder with Foley catheter.  Discussed the management with care team and family

## 2020-12-22 NOTE — Progress Notes (Signed)
Progress Note  Patient Name: Ryan Day Date of Encounter: 12/22/2020  Primary Cardiologist: New to [facility] - consult by [place]  Subjective   No further bradycardic events.   Inpatient Medications    Scheduled Meds: . ascorbic acid  500 mg Per Tube BID  . baclofen  10 mg Per Tube QID  . chlorhexidine gluconate (MEDLINE KIT)  15 mL Mouth Rinse BID  . Chlorhexidine Gluconate Cloth  6 each Topical Daily  . diclofenac Sodium  4 g Topical QID  . docusate  100 mg Per Tube BID  . feeding supplement (PROSource TF)  45 mL Per Tube Daily  . ferrous sulfate  75 mg Per Tube Daily  . FLUoxetine  40 mg Per Tube Daily  . free water  200 mL Per Tube Q4H  . furosemide  20 mg Intravenous BID  . guaiFENesin  400 mg Per Tube Q6H  . insulin aspart  0-15 Units Subcutaneous Q4H  . ipratropium-albuterol  3 mL Nebulization TID  . mouth rinse  15 mL Mouth Rinse 10 times per day  . midodrine  5 mg Per Tube BID WC  . nutrition supplement (JUVEN)  1 packet Per Tube BID BM  . polycarbophil  625 mg Per Tube Daily  . polyethylene glycol  17 g Per Tube Daily  . rivaroxaban  10 mg Per Tube Daily  . saccharomyces boulardii  250 mg Per Tube BID  . sennosides  5 mL Per Tube Q0600  . simethicone  80 mg Per Tube QID  . traZODone  50 mg Per Tube QHS  . zinc sulfate  220 mg Per Tube Daily   Continuous Infusions: . ceFEPime (MAXIPIME) IV 2 g (12/22/20 7482)  . famotidine (PEPCID) IV Stopped (12/21/20 2301)  . feeding supplement (PIVOT 1.5 CAL) 1,000 mL (12/21/20 1737)  . propofol (DIPRIVAN) infusion 10 mcg/kg/min (12/22/20 0555)  . vancomycin 500 mg (12/22/20 0852)   PRN Meds: acetaminophen, bisacodyl, docusate sodium, fentaNYL (SUBLIMAZE) injection, midazolam, ondansetron (ZOFRAN) IV, polyethylene glycol, sodium chloride flush   Vital Signs    Vitals:   12/22/20 0815 12/22/20 0830 12/22/20 0845 12/22/20 0900  BP: 110/60 111/60 104/65 (!) 130/92  Pulse: (!) 37 (!) 51 (!) 59 68  Resp:      Temp: (!)  95.54 F (35.3 C) (!) 95.54 F (35.3 C) (!) 95.36 F (35.2 C) (!) 95.36 F (35.2 C)  TempSrc:      SpO2: 98% 98% 97% 97%  Weight:      Height:        Intake/Output Summary (Last 24 hours) at 12/22/2020 0941 Last data filed at 12/22/2020 0854 Gross per 24 hour  Intake 1362.48 ml  Output 1775 ml  Net -412.52 ml   Filed Weights   12/19/20 0500 12/20/20 0455 12/21/20 0500  Weight: 130 kg 130 kg 130 kg    Telemetry    SR with PACs, no further episodes of bradycardia - Personally Reviewed  ECG    No new tracings - Personally Reviewed  Physical Exam   GEN: No acute distress.   Neck: JVD unable to be assessed secondary to respiratory support apparatus. Cardiac: RRR, no murmurs, rubs, or gallops.  Respiratory: Strained and diminished breath sounds bilaterally. Trach noted.   GI: Soft, nontender, distended.   MS: 2+ bilateral lower extremity edema. Neuro:  Alert. No facial asymmetry.   Psych: Unable to assess given trach.  Labs    Chemistry Recent Labs  Lab 12/20/20 0430 12/21/20 0441  12/22/20 0433  NA 142 143 145  K 3.4* 3.8 3.5  CL 108 108 110  CO2 '30 30 31  ' GLUCOSE 146* 133* 145*  BUN 32* 33* 37*  CREATININE 0.55* 0.49* 0.64  CALCIUM 9.3 9.5 9.6  GFRNONAA >60 >60 >60  ANIONGAP 4* 5 4*     Hematology Recent Labs  Lab 12/20/20 0430 12/21/20 0441 12/22/20 0433  WBC 7.4 6.8 7.3  RBC 2.81* 2.65* 2.60*  HGB 8.1* 7.7* 7.5*  HCT 24.9* 24.0* 23.8*  MCV 88.6 90.6 91.5  MCH 28.8 29.1 28.8  MCHC 32.5 32.1 31.5  RDW 16.4* 16.3* 17.1*  PLT 273 268 272    Cardiac EnzymesNo results for input(s): TROPONINI in the last 168 hours. No results for input(s): TROPIPOC in the last 168 hours.   BNP Recent Labs  Lab 12/17/20 0623  BNP 103.7*     DDimer No results for input(s): DDIMER in the last 168 hours.   Radiology    US Venous Img Upper Uni Right(DVT)  Result Date: 12/20/2020 IMPRESSION: Sonographic survey right upper extremity negative for DVT  Electronically Signed   By: Corrie Mckusick D.O.   On: 12/20/2020 16:56    Cardiac Studies   2D echo 12/09/2020: 1. Left ventricular ejection fraction, by estimation, is 55 to 60%. The  left ventricle has normal function. The left ventricle has no regional  wall motion abnormalities. There is mild left ventricular hypertrophy.  Left ventricular diastolic parameters  are consistent with Grade II diastolic dysfunction (pseudonormalization).  2. Right ventricular systolic function is normal. The right ventricular  size is normal.  3. The mitral valve is normal in structure. No evidence of mitral valve  regurgitation.  4. The aortic valve is grossly normal. Aortic valve regurgitation is not  Visualized. __________  2D echo 07/2020: 1. Left ventricular ejection fraction, by estimation, is 55 to 60%. The  left ventricle has normal function. The left ventricle has no regional  wall motion abnormalities. Left ventricular diastolic parameters are  consistent with Grade I diastolic  dysfunction (impaired relaxation).  2. Right ventricular systolic function is normal. The right ventricular  size is normal.  3. The mitral valve is normal in structure. No evidence of mitral valve  regurgitation. No evidence of mitral stenosis.  4. The aortic valve is normal in structure. Aortic valve regurgitation is  not visualized. No aortic stenosis is present.  Patient Profile     70 y.o. 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 Mortimer Fries.  Assessment & Plan    1. Bradycardic rates/sinus node dysfunction/junctiontional  escape rhythm: -Appears to have possibly been noted during admission at [facility] in 04/2020 as outlined above and felt to be related to spinal cord injury -Dating back to 1300 on 3/30 he has had 6 episodes of transient, brief, less than 1 minute, and at times lasting 30 seconds, bradycardic heart rates as outlined above -No further episodes as of 4/1 -Appears to be some degree of underlying sinus node dysfunction -Possibly in the setting of vasovagal etiology with underlying autonomic dysfunction in the context of patient manipulations and vent changes -Echo 12/09/2020 with a preserved LV systolic function with  no regional wall motion abnormalities, mild LVH, grade 2 diastolic dysfunction, normal RV systolic function and ventricular cavity size, and no significant valvular abnormalities -Magnesium has been normal and at goal -Potassium has been intermittently mildly low -Do not suspect his mild hypokalemia has been contributing to his transient brief bradycardic rates -TSH normal -Not currently on any AV nodal blocking medications, moving forward these should continue to be avoided -No indication for emergent venous temp wire or PPM at this time -Hemodynamically stable -If he becomes unstable or bradycardic heart rates persist consider atropine and/or dopamine -Could consider outpatient cardiac monitoring at discharge  2.  Acute on chronic diastolic CHF with lower extremity swelling: -Will benefit from diuresis -Possibly exacerbated by fludrocortisone, which was stopped on 3/3 and replaced with midodrine 5 mg twice daily after discussion with MD -Echo with grade 2 diastolic dysfunction as outlined above -Documented output is -26 L, though he appears to be volume overloaded on exam -Start IV Lasix 20 mg bid after discussion with MD  3.  Acute on chronic hypoxic and hypercapnic respiratory failure status post trach/sepsis secondary to UTI, Pseudomonas infection of the lungs, polymicrobial  bacteremia, sacral decubitus ulcer: -Management per PCCM  4.  Anemia: -Hgb low though stable -Management per CCM  5.  AKI: -Likely ATN in the setting of his severe infection and hypotension previously requiring vasopressor support -Improved -Avoid nephrotoxic agents  For questions or updates, please contact [facility] Please consult [email or web address] for contact info under Cardiology/STEMI.    Signed, Christell Faith, PA-C [place] Pager: [phone] 12/22/2020, 9:41 AM

## 2020-12-22 NOTE — Progress Notes (Signed)
Daily Progress Note   Patient Name: Ryan Day       Date: 12/22/2020 DOB: June 25, 1951  Age: 70 y.o. MRN#: [id number] Attending Physician: Flora Lipps, MD Primary Care Physician: Valerie Roys, DO Admit Date: 12/07/2020  Reason for Consultation/Follow-up: Establishing goals of care  Subjective: Patient sedated. Remains critically ill.   Updates provided to wife at the bedside. She verbalizes understanding. Reports she has gained peace with patient's condition and does not wish for him to continue to suffer in his current state. [name] states patient would not want to live long-term in his current health state and has conquered many health challenges over the years.   We discussed his full code status and what comfort care would look like for patient and family. We discussed once care is transitioned to focus on his comfort patient may pass away immediately or could be days (although would not expect this). Wife verbalized understanding.   She is not at a place to make decisions today and states plans are for family to have discussions tomorrow and determine final decisions. Support provided.   Discussed at length given patient's critical state patient is at high risk for sudden death and decline prior to family discussion and final decisions.  Wife verbalizes understanding and would need to make decisions for no heroic measures at that time.   [name] spent time sharing memories of their marriage and patient's years of being an awesome father and husband. She is emotional in discussions expressing she is not tearful because he is approaching end-of-life but that she knows how much she is going to miss him! Emotional support provided.   Wife will update medical team once family has made decisions. She would then wish for her 5 children and other family members to be able to visit prior to his passing. Education has been provided on the visitation policy in the setting of comfort/EOL  care.   Questions answered and support provided.  Length of Stay: 15 days  Vital Signs: BP (!) 130/92   Pulse 68   Temp (!) 95.36 F (35.2 C)   Resp 16   Ht 6' 5.01" (1.956 m)   Wt 130 kg   SpO2 97%   BMI 33.98 kg/m  SpO2: SpO2: 97 % O2 Device: O2 Device: Ventilator O2 Flow Rate: O2 Flow Rate (L/min): 8 L/min  Physical Exam: Clear ill-appearing Obtunded             Palliative Care Assessment & Plan  HPI: Palliative Care consult requested for goals of care discussion in this 70 y.o. male with medical history significant for diplegia, C5 cervical fracture, spinal cord compression, PEA arrest, chronic tracheostomy and PEG, hypertension, hyperlipidemia, neurogenic hypotension, chronic hypoxic and hypercapnic respiratory failure.  Patient presented to the ER on 3/1 7 with concerns of hematuria.  Per notation patient had a Foley catheter placed 2 weeks prior to admission in addition to completion of antibiotic therapy x7 days for UTI.  Since admission patient is receiving IV antibiotics for sepsis and Pseudomonas infections.  Remains on mechanical ventilation.  Cardiology following for severe bradycardia.  Code Status:  Full code  Goals of Care/Recommendations:  Continue with current plan of care, watchful waiting  Wife is realistic in understanding/expectations.  Expresses her understanding of hard decisions that need to be made.  She acknowledges her peace of knowing that patient will be going to heaven and that he would no longer be able to suffer in the setting of  end-of-life.  She wishes to have family discussions with her children over the next 24 hours prior to making final decisions.  Leaning towards DNR/transitioning all care to focus on his comfort.  Wife is aware given patient's tenuous condition he is at high risk of sudden death or cardiopulmonary event prior to any final decisions.  She is aware she would then make the needed decisions at that time regarding heroic  measures.  PMT will continue to support and follow.  No weekend coverage.  Prognosis: POOR  Discharge Planning: To Be Determined  Thank you for allowing the Palliative Medicine Team to assist in the care of this patient.  Time Total: 40 min  Visit consisted of counseling and education dealing with the complex and emotionally intense issues of symptom management and palliative care in the setting of serious and potentially life-threatening illness.Greater than 50%  of this time was spent counseling and coordinating care related to the above assessment and plan.  Alda Lea, AGPCNP-BC  Palliative Medicine Team [phone]

## 2020-12-22 NOTE — Progress Notes (Signed)
Pharmacy Antibiotic Note  Ryan Day is a 70 y.o. male admitted on 12/07/2020 with sepsis. Pt presented with complaints of recurrent UTI and hematuria with foley cath last placed ~2 weeks ago, exchanged in ED. 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 Pt has PEG and trache. Pt admitted 11/22-12/5 for HCAP and sacral ulcer with osteomyelitis. Hx of enterobacter cloacae, pseudomonas in ucx. Pt has received vanc, Unasyn, Zosyn, and cefepime on recent previous admissions. ID consulted. Pt with polymicrobial bacteremia with sepsis resulting from multiple sources including UTI, PNA, and sacral wound. Meropenem was discontinued 3/22 based on sensitivity to ceftazidime from BAL. IV Cipro was added due to increased secretions. Pharmacy has been consulted for vancomycin dosing.   Day 16 abx  Cipro d/c'ed 3/31 (11 days)   Vancomycin 500 mg q12h started 3/30 after renal adjustment; expected AUC: 567, Cmin 12.4  Vancomycin peak 4/1 taken at 1147 = 21 (2 hours after end of infusion)   Vancomycin trough 4/1 2008 = 17.0  Calculated AUC: 462.1 Cmin: 16.7, Ke 0.0253, t1/2: 27.4 hr AUC within range: Continue current regimen of 500 mg Q12H Re-check levels in few days or sooner if warranted     Plan: -- Continue Cefepime 2 grams q8h   -- AUC within range: Continue current regimen of 500 mg Q12H -- Re-check levels in few days or sooner if warranted -- Daily Scr per protocol      Height: 6' 5.01" (195.6 cm) Weight: 130 kg (286 lb 9.6 oz) IBW/kg (Calculated) : 89.12   Temp (24hrs), Avg:96.5 F (35.8 C), Min:95 F (35 C), Max:98.78 F (37.1 C)  Recent Labs  Lab 12/18/20 0524 12/19/20 0515 12/19/20 0625 12/20/20 0430 12/20/20 0500 12/20/20 1330 12/21/20 0441 12/22/20 0433 12/22/20 1147 12/22/20 2009  WBC 9.2 8.0  --  7.4  --   --  6.8 7.3  --   --   CREATININE 0.51* 0.56*  --  0.55*  --   --  0.49* 0.64  --   --   VANCOTROUGH  --   --    < >  --   --   21*  --   --   --  17  VANCOPEAK  --   --    < >  --  50*  --   --   --  21*  --    < > = values in this interval not displayed.    Estimated Creatinine Clearance: 131.9 mL/min (by C-G formula based on SCr of 0.64 mg/dL).    Allergies  Allergen Reactions  . Shrimp (Diagnostic)     Experiences facial droop when eating seafood    Antimicrobials this admission: 3/17 Vancomycin  >>  3/17 Cefepime  >> 3/20; 3/22 >> 3/17 Zosyn x 1  3/20 Meropenem >> 3/21  3/22 Cipro >> 3/31  Dose adjustments this admission: 3/27 Vanc 1000 mg q12h to 750 mg q12h 3/30 Vanc 750 mg q12h to 500 mg q12h  Microbiology results: 3/17 MRSA PCR: positive 3/17 BCx: E cloacae, MRSA, E faecalis 3/17 UCx: E cloacae 3/18 Resp cx: PSA 3/18 BAL: PSA (ceftazidime sensitive), MRSA, E faecalis 3/21 Sacral cx: MRSA, PSA 3/23 BCx: NGTD 3/27 Respiratory Cx: PSA (gentamicin sensitive)  Thank you for allowing pharmacy to be a part of this patient's care.  Dorothe Pea, PharmD, BCPS Clinical Pharmacist  12/22/2020

## 2020-12-22 NOTE — TOC Progression Note (Addendum)
Transition of Care [facility]) - Progression Note    Patient Details  Name: Alain Deschene MRN: [id number] Date of Birth: 1951-08-15  Transition of Care [facility]) CM/SW [place], [place] Phone Number:   [phone] 12/22/2020, 10:55 AM  Clinical Narrative:     Patient remains on vent.  Possible weaning trial.  Attending requesting family meeting.  Expected Discharge Plan: [place] Barriers to Discharge: Continued Medical Work up  Expected Discharge Plan and Services Expected Discharge Plan: [place] In-house Referral: Clinical Social Work   Post Acute Care Choice: Durable Medical Equipment Living arrangements for the past 2 months: Single Family Home                                       Social Determinants of Health (SDOH) Interventions    Readmission Risk Interventions No flowsheet data found.

## 2020-12-22 NOTE — Progress Notes (Signed)
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  According to pts family last week pt noted to have sediment in his urine at home concerning for UTI.  He had a foley catheter placed 2 weeks ago and completed a 7 day course of abx therapy due to UTI.  Due to concerns of recurrent UTI pts family contacted pts PCP office last week, and heard from his PCP this week and were awaiting urine culture results.  However, pt noted to have hematuria yesterday and today along with sinus tachycardia hr 120's.  Pts daughter is a Marine scientist and due to concern of sepsis pt transported to the ER via EMS for further evaluation and treatment.    ED course Upon arrival to the ER pts O2 sats were 75-80% on RA, therefore pt placed on NRB with O2 sats initially increasing to 100%.  Pt transitioned to trach collar with initial O2 sats in the low 90's.  However, pt noted to have mucous plugging resulting in hypoxia requiring exchange of tracheostomy from a cuffless trach to a size 6 mm cuffed trach and pt placed on mechanical ventilation.  ER vital signs were: temp 101.7 rectally, sbp 60-80's, and hr 130's. Lab results revealed Na+ 153, chloride 119, glucose 147, lactic acid 2.8, wbc 2.0, hbg 11.1, and UA positive for UTI.  CXR negative and COVID-19/Influenza PCR results pending.  Sepsis protocol initiated and pt received cefepime, vancomycin, and 2L LR bolus.  Pt remained severely hypotensive requiring levophed gtt and right femoral central line placement.  PCCM team contacted for ICU admission.    12/08/20- patient is improved some he is communicative.  Wife [name] at bedside we reviewed care plan.   12/10/20- patient is improved he is down to 84mg on levophed and 30%Fio2 on ventilator, mentating well. Reviewed  careplan with wife LVaughan Bastatoday.  12/11/20 PICC 12/12/20 Mucus plug, cleared with PPV-ambu 12/17/2020 increased FiO2 requirements, increased work of breathing.  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 C5 Cervical Fracture (following a mechanical fall at home) s/p C3-6 Laminectomy and Posterior Instrumentation and Fusion 08/4  Quadriplegia  Spinal Cord Compression  Chronic Tracheostomy and PEG    Significant Hospital Events: Including procedures, antibiotic start and stop dates in addition to other pertinent events   03/17: Pt admitted to ICU with urosepsis requiring vasopressors and acute on chronic hypoxic respiratory failure secondary to mucous plugging requiring exchange of cuffless trach to size 6 mm cuffed trach to be placed on mechanical ventilation  03/17: Right femoral central line placed by ER physician  03/17: CT Abd Pelvis findings with cystitis. Correlation with urinalysis is Recommended. Mild to moderate severity bibasilar atelectasis and/or Infiltrate. Sigmoid diverticulosis. Sacral decubitus ulcer, as described above. MRI correlation is recommended, as sequelae associated with acute osteomyelitis cannot be excluded. Aortic atherosclerosis. 03/17: CT Head revealed mild chronic ischemic white matter disease. No acute intracranial abnormality seen 03/17: Vancomycin x1 dose and Cefepime x1 dose 03/17: Zosyn>>completed 03/27: On Cefepime 3/30 severe bradycardia with decrease in PS mode 3/31 patient with increased  WOB placed on full vent support  Micro 12/07/2020 blood culture has methicillin-resistant staph aureus, Enterobacter cloacae and Enterococcus faecalis  12/13/2020 blood culture: No growth so  far  12/08/2020  bronchoalveolar lavage more than 100,000 colonies of Pseudomonas aeruginosa Methicillin-resistant staph aureus 40,000 colonies Enterococcus faecalis 20,000.  12/07/2020 urine culture Enterobacter cloacae   Sacral wound has rare Pseudomonas aeruginosa rare methicillin-resistant staph aureus few Bacteroides and rare Candida albicans. Likely stool contamination   Anti-infectives (From admission, onward)   Start     Dose/Rate Route Frequency Ordered Stop   12/20/20 2000  vancomycin (VANCOREADY) IVPB 500 mg/100 mL        500 mg 100 mL/hr over 60 Minutes Intravenous Every 12 hours 12/20/20 1709     12/17/20 1400  vancomycin (VANCOREADY) IVPB 750 mg/150 mL  Status:  Discontinued        750 mg 150 mL/hr over 60 Minutes Intravenous Every 12 hours 12/17/20 1159 12/20/20 0604   12/13/20 1000  vancomycin (VANCOREADY) IVPB 1000 mg/200 mL  Status:  Discontinued        1,000 mg 200 mL/hr over 60 Minutes Intravenous Every 12 hours 12/13/20 0832 12/17/20 1159   12/12/20 1800  ciprofloxacin (CIPRO) IVPB 400 mg  Status:  Discontinued        400 mg 200 mL/hr over 60 Minutes Intravenous Every 8 hours 12/12/20 1620 12/21/20 1529   12/12/20 1700  ciprofloxacin (CIPRO) IVPB 400 mg  Status:  Discontinued        400 mg 200 mL/hr over 60 Minutes Intravenous Every 12 hours 12/12/20 1609 12/12/20 1620   12/12/20 1615  vancomycin variable dose per unstable renal function (pharmacist dosing)  Status:  Discontinued         Does not apply See admin instructions 12/12/20 1615 12/13/20 1050   12/12/20 1100  vancomycin (VANCOREADY) IVPB 1750 mg/350 mL        1,750 mg 175 mL/hr over 120 Minutes Intravenous  Once 12/12/20 0957 12/12/20 1301   12/12/20 0600  ceFEPIme (MAXIPIME) 2 g in sodium chloride 0.9 % 100 mL IVPB        2 g 200 mL/hr over 30 Minutes Intravenous Every 8 hours 12/11/20 1929     12/11/20 1522  vancomycin variable dose per unstable renal function (pharmacist dosing)  Status:   Discontinued         Does not apply See admin instructions 12/11/20 1522 12/12/20 1607   12/11/20 0900  vancomycin (VANCOREADY) IVPB 1000 mg/200 mL  Status:  Discontinued        1,000 mg 200 mL/hr over 60 Minutes Intravenous Every 12 hours 12/11/20 0813 12/11/20 1522   12/10/20 1515  meropenem (MERREM) 1 g in sodium chloride 0.9 % 100 mL IVPB        1 g 200 mL/hr over 30 Minutes Intravenous Every 8 hours 12/10/20 1418 12/12/20 0834   12/09/20 1000  ceFEPIme (MAXIPIME) 2 g in sodium chloride 0.9 % 100 mL IVPB  Status:  Discontinued        2 g 200 mL/hr over 30 Minutes Intravenous Every 8 hours 12/09/20 0811 12/10/20 1418   12/09/20 0730  vancomycin (VANCOREADY) IVPB 2000 mg/400 mL        2,000 mg 200 mL/hr over 120 Minutes Intravenous  Once 12/09/20 0644 12/09/20 1616   12/09/20 0600  vancomycin (VANCOREADY) IVPB 1750 mg/350 mL  Status:  Discontinued        1,750 mg 175 mL/hr over 120 Minutes Intravenous Every 24  hours 12/08/20 0809 12/08/20 1229   12/08/20 2200  ceFEPIme (MAXIPIME) 2 g in sodium chloride 0.9 % 100 mL IVPB  Status:  Discontinued        2 g 200 mL/hr over 30 Minutes Intravenous Every 12 hours 12/08/20 0809 12/09/20 0811   12/08/20 1700  vancomycin (VANCOREADY) IVPB 1500 mg/300 mL  Status:  Discontinued        1,500 mg 150 mL/hr over 120 Minutes Intravenous Every 12 hours 12/08/20 0500 12/08/20 0809   12/08/20 1230  vancomycin variable dose per unstable renal function (pharmacist dosing)  Status:  Discontinued         Does not apply See admin instructions 12/08/20 1230 12/11/20 1359   12/08/20 0545  ceFEPIme (MAXIPIME) 2 g in sodium chloride 0.9 % 100 mL IVPB  Status:  Discontinued        2 g 200 mL/hr over 30 Minutes Intravenous Every 8 hours 12/08/20 0446 12/08/20 0809   12/08/20 0530  vancomycin (VANCOREADY) IVPB 2000 mg/400 mL       "Followed by" Linked Group Details   2,000 mg 200 mL/hr over 120 Minutes Intravenous  Once 12/08/20 0441 12/08/20 0815   12/08/20 0530   vancomycin (VANCOREADY) IVPB 500 mg/100 mL  Status:  Discontinued       "Followed by" Linked Group Details   500 mg 100 mL/hr over 60 Minutes Intravenous  Once 12/08/20 0441 12/08/20 0803   12/07/20 2215  piperacillin-tazobactam (ZOSYN) IVPB 3.375 g  Status:  Discontinued        3.375 g 12.5 mL/hr over 240 Minutes Intravenous Every 8 hours 12/07/20 2124 12/08/20 0447   12/07/20 1800  ceFEPIme (MAXIPIME) 2 g in sodium chloride 0.9 % 100 mL IVPB        2 g 200 mL/hr over 30 Minutes Intravenous  Once 12/07/20 1757 12/07/20 2036   12/07/20 1800  vancomycin (VANCOCIN) IVPB 1000 mg/200 mL premix        1,000 mg 200 mL/hr over 60 Minutes Intravenous  Once 12/07/20 1757 12/07/20 1942       Interim History / Subjective:  Sedated on vent Severe resp distress and failure Placed on full vent support Remains critically ill     Objective   Blood pressure 103/65, pulse (!) 55, temperature (!) 95.72 F (35.4 C), resp. rate 16, height 6' 5.01" (1.956 m), weight 130 kg, SpO2 98 %.    Vent Mode: PRVC FiO2 (%):  [30 %-40 %] 40 % Set Rate:  [16 bmp] 16 bmp Vt Set:  [500 mL] 500 mL PEEP:  [5 cmH20] 5 cmH20 Pressure Support:  [10 cmH20-15 cmH20] 15 cmH20   Intake/Output Summary (Last 24 hours) at 12/22/2020 0715 Last data filed at 12/22/2020 0426 Gross per 24 hour  Intake 1362.48 ml  Output 975 ml  Net 387.48 ml   Filed Weights   12/19/20 0500 12/20/20 0455 12/21/20 0500  Weight: 130 kg 130 kg 130 kg   REVIEW OF SYSTEMS  PATIENT IS UNABLE TO PROVIDE COMPLETE REVIEW OF SYSTEMS DUE TO SEVERE CRITICAL ILLNESS AND TOXIC METABOLIC ENCEPHALOPATHY  PHYSICAL EXAMINATION:  GENERAL:critically ill appearing, +resp distress HEAD: Normocephalic, atraumatic.  EYES: Pupils equal, round, reactive to light.  No scleral icterus.  MOUTH: Moist mucosal membrane. NECK: Supple. No thyromegaly. No nodules. No JVD.  PULMONARY: +rhonchi, +wheezing CARDIOVASCULAR: S1 and S2. Regular rate and rhythm. No  murmurs, rubs, or gallops.  GASTROINTESTINAL: Soft, nontender, -distended. Positive bowel sounds.  MUSCULOSKELETAL: No swelling, clubbing, or edema.  NEUROLOGIC: obtunded SKIN +sacral decub    Labs/imaging personally reviewed   CT Abd/Pelvis CXR CT Head  UA  Resolved Hospital Problem list   N/A  Assessment & Plan:   70 yo quadriplegic with multiple mecial issues with acute hypoxic resp failure due to severe polymicrobial sepsis and septic shock  from pseudomonal infections of lungs and sacral decub    Severe ACUTE Hypoxic and Hypercapnic Respiratory Failure -continue Mechanical Ventilator support -continue Bronchodilator Therapy -Wean Fio2 and PEEP as tolerated -VAP/VENT bundle implementation +sepsis and mucus plugging S/p bronch - many GPCs , cleared bilateral airways  He did NOT tolerate PS 5/5 mode back on PRVC mode Vent Mode: PRVC FiO2 (%):  [30 %-40 %] 40 % Set Rate:  [16 bmp] 16 bmp Vt Set:  [500 mL] 500 mL PEEP:  [5 cmH20] 5 cmH20 Pressure Support:  [10 cmH20-15 cmH20] 15 LHT34  ACUTE DIASTOLIC CARDIAC FAILURE-bradyarrythmias likley related to neurogenic causes -oxygen as needed -Lasix as tolerated -follow up cardiac enzymes as indicated -follow up cardiology recs  Septic shock -use vasopressors to keep MAP>65 as needed  INFECTIOUS DISEASE -continue antibiotics as prescribed -follow up cultures -follow up ID consultation  ACUTE KIDNEY INJURY/Renal Failure -continue Foley Catheter-assess need -Avoid nephrotoxic agents -Follow urine output, BMP -Ensure adequate renal perfusion, optimize oxygenation -Renal dose medications  Intake/Output Summary (Last 24 hours) at 12/22/2020 0715 Last data filed at 12/22/2020 0426 Gross per 24 hour  Intake 1362.48 ml  Output 975 ml  Net 387.48 ml      NEUROLOGY ACUTE TOXIC METABOLIC ENCEPHALOPATHY -need for sedation -Goal RASS -2 to -3   Best practice (evaluated daily)  Diet:  NPO Pain/Anxiety/Delirium  protocol (if indicated): Yes (RASS goal 0) VAP protocol (if indicated): Yes DVT prophylaxis: Systemic AC GI prophylaxis: H2B Glucose control:  SSI No Central venous access:  Yes, and it is still needed Arterial line:  N/A Foley:  Yes, and it is still needed Mobility:  bed rest  PT consulted: N/A Last date of multidisciplinary goals of care discussion [N/A]  Code Status:  full code Disposition: ICU      Labs   CBC: Recent Labs  Lab 12/18/20 0524 12/19/20 0515 12/20/20 0430 12/21/20 0441 12/22/20 0433  WBC 9.2 8.0 7.4 6.8 7.3  HGB 7.6* 8.0* 8.1* 7.7* 7.5*  HCT 23.9* 25.2* 24.9* 24.0* 23.8*  MCV 89.5 88.7 88.6 90.6 91.5  PLT 211 263 273 268 287    Basic Metabolic Panel: Recent Labs  Lab 12/18/20 0524 12/19/20 0515 12/20/20 0430 12/21/20 0441 12/22/20 0433  NA 138 139 142 143 145  K 3.2* 3.5 3.4* 3.8 3.5  CL 102 104 108 108 110  CO2 _0 GLUCOSE 113* 138* 146* 133* 145*  BUN 31* 34* 32* 33* 37*  CREATININE 0.51* 0.56* 0.55* 0.49* 0.64  CALCIUM 9.2 9.3 9.3 9.5 9.6  MG 1.9 2.1 2.0 2.3 2.5*  PHOS 2.5 1.9* 2.5 2.4* 2.9   GFR: Estimated Creatinine Clearance: 131.9 mL/min (by C-G formula based on SCr of 0.64 mg/dL). Recent Labs  Lab 12/19/20 0515 12/20/20 0430 12/21/20 0441 12/22/20 0433  WBC 8.0 7.4 6.8 7.3    Liver Function Tests: No results for input(s): AST, ALT, ALKPHOS, BILITOT, PROT, ALBUMIN in the last 168 hours. No results for input(s): LIPASE, AMYLASE in the last 168 hours. No results for input(s): AMMONIA in the last 168 hours.  ABG    Component Value Date/Time   PHART 7.38 12/08/2020 0500  PCO2ART 42 12/08/2020 0500   PO2ART 115 (H) 12/08/2020 0500   HCO3 24.8 12/08/2020 0500   ACIDBASEDEF 0.4 12/08/2020 0500   O2SAT 98.4 12/08/2020 0500     Coagulation Profile: No results for input(s): INR, PROTIME in the last 168 hours.  Cardiac Enzymes: No results for input(s): CKTOTAL, CKMB, CKMBINDEX, TROPONINI in the last 168  hours.  HbA1C: Hgb A1c MFr Bld  Date/Time Value Ref Range Status  06/09/2020 07:00 AM 5.8 (H) 4.8 - 5.6 % Final    Comment:    (NOTE) Pre diabetes:          5.7%-6.4%  Diabetes:              >6.4%  Glycemic control for   <7.0% adults with diabetes     CBG: Recent Labs  Lab 12/21/20 1154 12/21/20 1544 12/21/20 1926 12/21/20 2333 12/22/20 0422  GLUCAP 119* 122* 139* 138* 142*    Review of Systems:   Unable to assess pt chronically trached currently requiring mechanical ventilation   Allergies Allergies  Allergen Reactions  . Shrimp (Diagnostic)     Experiences facial droop when eating seafood     Scheduled Meds: . ascorbic acid  500 mg Per Tube BID  . baclofen  10 mg Per Tube QID  . chlorhexidine gluconate (MEDLINE KIT)  15 mL Mouth Rinse BID  . Chlorhexidine Gluconate Cloth  6 each Topical Daily  . diclofenac Sodium  4 g Topical QID  . docusate  100 mg Per Tube BID  . feeding supplement (PROSource TF)  45 mL Per Tube Daily  . ferrous sulfate  75 mg Per Tube Daily  . FLUoxetine  40 mg Per Tube Daily  . free water  200 mL Per Tube Q4H  . guaiFENesin  400 mg Per Tube Q6H  . insulin aspart  0-15 Units Subcutaneous Q4H  . ipratropium-albuterol  3 mL Nebulization TID  . mouth rinse  15 mL Mouth Rinse 10 times per day  . midodrine  5 mg Per Tube BID WC  . nutrition supplement (JUVEN)  1 packet Per Tube BID BM  . polycarbophil  625 mg Per Tube Daily  . polyethylene glycol  17 g Per Tube Daily  . rivaroxaban  10 mg Per Tube Daily  . saccharomyces boulardii  250 mg Per Tube BID  . sennosides  5 mL Per Tube Q0600  . simethicone  80 mg Per Tube QID  . traZODone  50 mg Per Tube QHS  . zinc sulfate  220 mg Per Tube Daily   Continuous Infusions: . ceFEPime (MAXIPIME) IV 2 g (12/22/20 1017)  . famotidine (PEPCID) IV Stopped (12/21/20 2301)  . feeding supplement (PIVOT 1.5 CAL) 1,000 mL (12/21/20 1737)  . propofol (DIPRIVAN) infusion 10 mcg/kg/min (12/22/20 0555)  .  vancomycin Stopped (12/21/20 2103)   PRN Meds:.acetaminophen, bisacodyl, docusate sodium, fentaNYL (SUBLIMAZE) injection, midazolam, ondansetron (ZOFRAN) IV, polyethylene glycol, sodium chloride flush        DVT/GI PRX  assessed I Assessed the need for Labs I Assessed the need for Foley I Assessed the need for Central Venous Line Family Discussion when available I Assessed the need for Mobilization I made an Assessment of medications to be adjusted accordingly Safety Risk assessment completed  CASE DISCUSSED IN MULTIDISCIPLINARY ROUNDS WITH ICU TEAM     Critical Care Time devoted to patient care services described in this note is 54 minutes.   Overall, patient is critically ill, prognosis is guarded.  Patient with  Multiorgan failure and at high risk for cardiac arrest and death.    Corrin Parker, M.D.  [facility]  Medical Director [place] Director [facility] Cardio-Pulmonary Department

## 2020-12-22 NOTE — Consult Note (Signed)
Ryan Day for Electrolyte Monitoring and Replacement   Recent Labs: Potassium (mmol/L)  Date Value  12/22/2020 3.5   Magnesium (mg/dL)  Date Value  12/22/2020 2.5 (H)   Calcium (mg/dL)  Date Value  12/22/2020 9.6   Albumin (g/dL)  Date Value  12/07/2020 3.1 (L)  02/01/2020 4.4   Phosphorus (mg/dL)  Date Value  12/22/2020 2.9   Sodium (mmol/L)  Date Value  12/22/2020 145  02/01/2020 143    Assessment: Patient is a 70 y/o M presented with sepsis. Pt presented with complaints of recurrent UTI and hematuria. 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 Pt has chronic tracheostomy and PEG tube. PMH includes HTN and HLD. Pharmacy has been consulted to assist with electrolyte monitoring and replacement as indicated.   Scr now around baseline (~0.5):  0.51 > 0.56 > 0.55 > 0.49 > 0.64  Nutrition: tube feeds @ 70 mL/hr, prosource tube feeds once daily   MIVF: Free water at 200 mL Q4h (1200 mL/day)   Lasix 20 mg BID started 4/1   Goal of Therapy:  Electrolytes within normal limits  Plan:  -- Na 145 (trending up): Continue free water 200 per tube q4h (1200 mL/day)      -- K 3.5 LLN now started lasix: give 40 mEq KCl per tube x 1 dose   -- Follow-up electrolytes with AM labs  Doreatha Massed, Student-PharmD 12/22/2020

## 2020-12-23 DIAGNOSIS — J9601 Acute respiratory failure with hypoxia: Secondary | ICD-10-CM | POA: Diagnosis not present

## 2020-12-23 DIAGNOSIS — R140 Abdominal distension (gaseous): Secondary | ICD-10-CM | POA: Diagnosis not present

## 2020-12-23 DIAGNOSIS — G8250 Quadriplegia, unspecified: Secondary | ICD-10-CM | POA: Diagnosis not present

## 2020-12-23 LAB — BASIC METABOLIC PANEL
Anion gap: 7 (ref 5–15)
BUN: 45 mg/dL — ABNORMAL HIGH (ref 8–23)
CO2: 31 mmol/L (ref 22–32)
Calcium: 9.7 mg/dL (ref 8.9–10.3)
Chloride: 108 mmol/L (ref 98–111)
Creatinine, Ser: 0.63 mg/dL (ref 0.61–1.24)
GFR, Estimated: >60 mL/min (ref >60)
Glucose, Bld: 124 mg/dL — ABNORMAL HIGH (ref 70–99)
Potassium: 3.8 mmol/L (ref 3.5–5.1)
Sodium: 146 mmol/L — ABNORMAL HIGH (ref 135–145)

## 2020-12-23 LAB — MAGNESIUM: Magnesium: 2.2 mg/dL (ref 1.7–2.4)

## 2020-12-23 LAB — CBC
HCT: 25.2 % — ABNORMAL LOW (ref 39.0–52.0)
Hemoglobin: 7.8 g/dL — ABNORMAL LOW (ref 13.0–17.0)
MCH: 28.4 pg (ref 26.0–34.0)
MCHC: 31 g/dL (ref 30.0–36.0)
MCV: 91.6 fL (ref 80.0–100.0)
Platelets: 331 10*3/uL (ref 150–400)
RBC: 2.75 MIL/uL — ABNORMAL LOW (ref 4.22–5.81)
RDW: 17.2 % — ABNORMAL HIGH (ref 11.5–15.5)
WBC: 7.7 10*3/uL (ref 4.0–10.5)
nRBC: 0.3 % — ABNORMAL HIGH (ref 0.0–0.2)

## 2020-12-23 LAB — GLUCOSE, CAPILLARY
Glucose-Capillary: 125 mg/dL — ABNORMAL HIGH (ref 70–99)
Glucose-Capillary: 129 mg/dL — ABNORMAL HIGH (ref 70–99)
Glucose-Capillary: 116 mg/dL — ABNORMAL HIGH (ref 70–99)

## 2020-12-23 LAB — PHOSPHORUS: Phosphorus: 2.8 mg/dL (ref 2.5–4.6)

## 2020-12-23 MED ORDER — SODIUM CHLORIDE 0.9 % IV SOLN
INTRAVENOUS | Status: DC | PRN
  Administered 2020-12-23 – 2021-01-01 (×5): 250 mL via INTRAVENOUS
  Administered 2021-01-05: 500 mL via INTRAVENOUS

## 2020-12-23 MED ORDER — POTASSIUM CHLORIDE 20 MEQ PO PACK
20.0000 meq | PACK | Freq: Once | ORAL | Status: AC
  Administered 2020-12-23: 20 meq
  Filled 2020-12-23: qty 1

## 2020-12-23 NOTE — Consult Note (Signed)
[place] for Electrolyte Monitoring and Replacement   Recent Labs: Potassium (mmol/L)  Date Value  12/23/2020 3.8   Magnesium (mg/dL)  Date Value  12/23/2020 2.2   Calcium (mg/dL)  Date Value  12/23/2020 9.7   Albumin (g/dL)  Date Value  12/07/2020 3.1 (L)  02/01/2020 4.4   Phosphorus (mg/dL)  Date Value  12/23/2020 2.8   Sodium (mmol/L)  Date Value  12/23/2020 146 (H)  02/01/2020 143    Assessment: Patient is a 70 y/o M presented with sepsis. Pt presented with complaints of recurrent UTI and hematuria. 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   Pt has chronic tracheostomy and PEG tube. PMH includes HTN and HLD.   Pharmacy has been consulted to assist with electrolyte monitoring and replacement as indicated.   Scr now around baseline (~0.5):  0.51 > 0.56 > 0.55 > 0.49 > 0.64  Nutrition: tube feeds @ 70 mL/hr, prosource tube feeds once daily   MIVF: Free water at 200 mL Q4h (1200 mL/day)   Lasix 20 mg BID started 4/1   Goal of Therapy:  Electrolytes within normal limits  Plan:  -- Na 146 (trending up): Continue free water 200 per tube q4h (1200 mL/day)      -- K 3.8 LLN now started lasix: give 20 mEq KCl per tube x 1 dose   -- Follow-up electrolytes with AM labs  Lu Duffel, PharmD, BCPS Clinical Pharmacist 12/23/2020 9:51 AM

## 2020-12-23 NOTE — Progress Notes (Addendum)
Progress Note  Patient Name: Ryan Day Date of Encounter: 12/23/2020  Primary Cardiologist: New to [facility] - consult by [facility]  Subjective   Remains sedated, opens eyes. No further bradycardic events. Started on IV Lasix with 3.8 L for the past 24 hours. Family meeting to occur.   Inpatient Medications    Scheduled Meds: . ascorbic acid  500 mg Per Tube BID  . baclofen  10 mg Per Tube QID  . chlorhexidine gluconate (MEDLINE KIT)  15 mL Mouth Rinse BID  . Chlorhexidine Gluconate Cloth  6 each Topical Daily  . diclofenac Sodium  4 g Topical QID  . docusate  100 mg Per Tube BID  . feeding supplement (PROSource TF)  45 mL Per Tube Daily  . ferrous sulfate  75 mg Per Tube Daily  . FLUoxetine  40 mg Per Tube Daily  . free water  200 mL Per Tube Q4H  . furosemide  20 mg Intravenous BID  . guaiFENesin  400 mg Per Tube Q6H  . insulin aspart  0-15 Units Subcutaneous Q4H  . ipratropium-albuterol  3 mL Nebulization TID  . mouth rinse  15 mL Mouth Rinse 10 times per day  . midodrine  5 mg Per Tube BID WC  . nutrition supplement (JUVEN)  1 packet Per Tube BID BM  . polycarbophil  625 mg Per Tube Daily  . polyethylene glycol  17 g Per Tube Daily  . rivaroxaban  10 mg Per Tube Daily  . saccharomyces boulardii  250 mg Per Tube BID  . sennosides  5 mL Per Tube Q0600  . simethicone  80 mg Per Tube QID  . traZODone  50 mg Per Tube QHS  . zinc sulfate  220 mg Per Tube Daily   Continuous Infusions: . sodium chloride 5 mL/hr at 12/23/20 0700  . ceFEPime (MAXIPIME) IV Stopped (12/23/20 0531)  . famotidine (PEPCID) IV Stopped (12/23/20 0040)  . feeding supplement (PIVOT 1.5 CAL) 1,000 mL (12/23/20 0456)  . propofol (DIPRIVAN) infusion 10 mcg/kg/min (12/23/20 0700)  . vancomycin Stopped (12/22/20 2314)   PRN Meds: sodium chloride, acetaminophen, bisacodyl, docusate sodium, fentaNYL (SUBLIMAZE) injection, midazolam, ondansetron (ZOFRAN) IV, polyethylene glycol, sodium chloride flush    Vital Signs    Vitals:   12/23/20 0600 12/23/20 0630 12/23/20 0700 12/23/20 0734  BP: (!) 107/53 107/63 116/65   Pulse: 80 82 89   Resp:  (!) 22 (!) 40   Temp:   99.4 F (37.4 C)   TempSrc:   Axillary   SpO2: 96% 91% 96% 96%  Weight:      Height:        Intake/Output Summary (Last 24 hours) at 12/23/2020 0811 Last data filed at 12/23/2020 0700 Gross per 24 hour  Intake 2559.34 ml  Output 6400 ml  Net -3840.66 ml   Filed Weights   12/20/20 0455 12/21/20 0500 12/23/20 0500  Weight: 130 kg 130 kg 123.4 kg    Telemetry    SR with PACs, no further episodes of bradycardia - Personally Reviewed  ECG    No new tracings - Personally Reviewed  Physical Exam   GEN: No acute distress.   Neck: JVD unable to be assessed secondary to respiratory support apparatus. Cardiac: RRR, no murmurs, rubs, or gallops.  Respiratory: Diminished breath sounds bilaterally. Trach noted.   GI: Soft, nontender, distended.   MS: 2+ bilateral lower extremity edema. Neuro:  Sedated, opens eyes. Psych: Sedated.  Labs    Chemistry Recent Labs  Lab 12/21/20 0441 12/22/20 0433 12/23/20 0421  NA 143 145 146*  K 3.8 3.5 3.8  CL 108 110 108  CO2 '30 31 31  ' GLUCOSE 133* 145* 124*  BUN 33* 37* 45*  CREATININE 0.49* 0.64 0.63  CALCIUM 9.5 9.6 9.7  GFRNONAA >60 >60 >60  ANIONGAP 5 4* 7     Hematology Recent Labs  Lab 12/21/20 0441 12/22/20 0433 12/23/20 0421  WBC 6.8 7.3 7.7  RBC 2.65* 2.60* 2.75*  HGB 7.7* 7.5* 7.8*  HCT 24.0* 23.8* 25.2*  MCV 90.6 91.5 91.6  MCH 29.1 28.8 28.4  MCHC 32.1 31.5 31.0  RDW 16.3* 17.1* 17.2*  PLT 268 272 331    Cardiac EnzymesNo results for input(s): TROPONINI in the last 168 hours. No results for input(s): TROPIPOC in the last 168 hours.   BNP Recent Labs  Lab 12/17/20 0623  BNP 103.7*     DDimer No results for input(s): DDIMER in the last 168 hours.   Radiology    US Venous Img Upper Uni Right(DVT)  Result Date: 12/20/2020 IMPRESSION:  Sonographic survey right upper extremity negative for DVT Electronically Signed   By: Corrie Mckusick D.O.   On: 12/20/2020 16:56    Cardiac Studies   2D echo 12/09/2020: 1. Left ventricular ejection fraction, by estimation, is 55 to 60%. The  left ventricle has normal function. The left ventricle has no regional  wall motion abnormalities. There is mild left ventricular hypertrophy.  Left ventricular diastolic parameters  are consistent with Grade II diastolic dysfunction (pseudonormalization).  2. Right ventricular systolic function is normal. The right ventricular  size is normal.  3. The mitral valve is normal in structure. No evidence of mitral valve  regurgitation.  4. The aortic valve is grossly normal. Aortic valve regurgitation is not  Visualized. __________  2D echo 07/2020: 1. Left ventricular ejection fraction, by estimation, is 55 to 60%. The  left ventricle has normal function. The left ventricle has no regional  wall motion abnormalities. Left ventricular diastolic parameters are  consistent with Grade I diastolic  dysfunction (impaired relaxation).  2. Right ventricular systolic function is normal. The right ventricular  size is normal.  3. The mitral valve is normal in structure. No evidence of mitral valve  regurgitation. No evidence of mitral stenosis.  4. The aortic valve is normal in structure. Aortic valve regurgitation is  not visualized. No aortic stenosis is present.  Patient Profile     70 y.o. 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 Mortimer Fries.  Assessment & Plan    1.  Bradycardic rates/sinus node dysfunction/junctiontional escape rhythm: -Appears to have possibly been noted during admission at [facility] in 04/2020 as outlined above and felt to be related to spinal cord injury -Dating back to 1300 on 3/30 he has had 6 episodes of transient, brief, less than 1 minute, and at times lasting 30 seconds, bradycardic heart rates as outlined above -No further episodes since 3/31 -Appears to be some degree of underlying sinus node dysfunction -Possibly in the setting of vasovagal etiology with underlying autonomic dysfunction in the context of patient manipulations and vent changes -Echo 12/09/2020 with a preserved  LV systolic function with no regional wall motion abnormalities, mild LVH, grade 2 diastolic dysfunction, normal RV systolic function and ventricular cavity size, and no significant valvular abnormalities -Magnesium has been normal and at goal -Potassium has been intermittently mildly low, though is normal -Do not suspect his mild hypokalemia has been contributing to his transient brief bradycardic rates -TSH normal -Not currently on any AV nodal blocking medications, moving forward these should continue to be avoided -No indication for emergent venous temp wire or PPM at this time -Hemodynamically stable -If he becomes unstable or bradycardic heart rates persist consider atropine and/or dopamine -Could consider outpatient cardiac monitoring at discharge if indicated   2.  Acute on chronic diastolic CHF with lower extremity swelling: -Chelci [place] benefit from diuresis -Possibly exacerbated by fludrocortisone, which was stopped on 3/3 and replaced with midodrine 5 mg twice daily after discussion with MD -Echo with grade 2 diastolic dysfunction as outlined above -Documented output is -26 L, though he appears to be volume overloaded on exam -Continue IV Lasix 20 mg bid   3.  Acute on chronic hypoxic and hypercapnic respiratory failure status post trach/sepsis secondary  to UTI, Pseudomonas infection of the lungs, polymicrobial bacteremia, sacral decubitus ulcer: -Management per PCCM  4.  Anemia: -Hgb low though stable -Management per CCM  5.  AKI: -Likely ATN in the setting of his severe infection and hypotension previously requiring vasopressor support -Improved -Avoid nephrotoxic agents  For questions or updates, please contact [facility] Please consult [email or web address] for contact info under Cardiology/STEMI.    Signed, Christell Faith, PA-C [employer] Pager: [phone] 12/23/2020, 8:11 AM  .[email or web address] I have seen and examined this patient with Christell Faith.  Agree with above, note added to reflect my findings.  On exam, RRR, no murmurs, generalized edema.  Patient remains sedated, though he does open his eyes.  Family meeting planned to occur today to discuss goals of care further.  He has been having bradycardic episodes while turning.  These are likely due to a vagal events.  Fortunately he has not become severely bradycardic, only down into the 40s.  At this point, supportive care is necessary.  No device implantation at this time.  We Ja Ohman continue with diuresis with Lasix.  Amarie Tarte await further plans pending family discussion.  Shritha Bresee M. Auburn Hester MD 12/23/2020 11:29 AM

## 2020-12-23 NOTE — Progress Notes (Signed)
NAME:  Ryan Day, MRN:  [id number], DOB:  [birth year], LOS: 50 ADMISSION DATE:  12/07/2020   BRIEF SYNOPSIS 70 yo quadriplegic male with a chronic tracheostomy who presented to [facility] ER on 03/17 with hematuria.  According to pts family last week pt noted to have sediment in his urine at home concerning for UTI.  He had a foley catheter placed 2 weeks ago and completed a 7 day course of abx therapy due to UTI.  Due to concerns of recurrent UTI pts family contacted pts PCP office last week, and heard from his PCP this week and were awaiting urine culture results.  However, pt noted to have hematuria yesterday and today along with sinus tachycardia hr 120's.  Pts daughter is a Marine scientist and due to concern of sepsis pt transported to the ER via EMS for further evaluation and treatment.     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 C5 Cervical Fracture (following a mechanical fall at home) s/p C3-6 Laminectomy and Posterior Instrumentation and Fusion 08/4  Quadriplegia  Spinal Cord Compression  Chronic Tracheostomy and PEG   Significant Hospital Events: Including procedures, antibiotic start and stop dates in addition to other pertinent events    Significant Hospital Events: Including procedures, antibiotic start and stop dates in addition to other pertinent events   03/17: Pt admitted to ICU with urosepsis requiring vasopressors and acute on chronic hypoxic respiratory failure secondary to mucous plugging requiring exchange of cuffless trach to size 6 mm cuffed trach to be placed on mechanical ventilation  03/17: Right femoral central line placed by ER physician  03/17: CT Abd Pelvis findings with cystitis. Correlation with urinalysis is Recommended. Mild to moderate severity bibasilar atelectasis and/or Infiltrate. Sigmoid diverticulosis. Sacral decubitus ulcer, as described above. MRI correlation is recommended, as sequelae  associated with acute osteomyelitis cannot be excluded. Aortic atherosclerosis. 03/17: CT Head revealed mild chronic ischemic white matter disease. No acute intracranial abnormality seen 03/17: Vancomycin x1 dose and Cefepime x1 dose 03/17: Zosyn>>completed 03/27: On Cefepime 3/30 severe bradycardia with decrease in PS mode 3/31 patient with increased WOB placed on full vent support  Micro 12/07/2020 blood culture has methicillin-resistant staph aureus, Enterobacter cloacae and Enterococcus faecalis  12/13/2020 blood culture: No growth so far  12/08/2020  bronchoalveolar lavage more than 100,000 colonies of Pseudomonas aeruginosa Methicillin-resistant staph aureus 40,000 colonies Enterococcus faecalis 20,000.  12/07/2020 urine culture Enterobacter cloacae   Sacral wound has rare Pseudomonas aeruginosa rare methicillin-resistant staph aureus few Bacteroides and rare Candida albicans. Likely stool contamination  Interim History / Subjective:  Remains on vent Severe resp distress Failure to wean from vent Remains critically ill     Antimicrobials:   Anti-infectives (From admission, onward)   Start     Dose/Rate Route Frequency Ordered Stop   12/20/20 2000  vancomycin (VANCOREADY) IVPB 500 mg/100 mL        500 mg 100 mL/hr over 60 Minutes Intravenous Every 12 hours 12/20/20 1709     12/17/20 1400  vancomycin (VANCOREADY) IVPB 750 mg/150 mL  Status:  Discontinued        750 mg 150 mL/hr over 60 Minutes Intravenous Every 12 hours 12/17/20 1159 12/20/20 0604   12/13/20 1000  vancomycin (VANCOREADY) IVPB 1000 mg/200 mL  Status:  Discontinued        1,000 mg 200 mL/hr over 60 Minutes Intravenous Every 12 hours 12/13/20 0832 12/17/20 1159   12/12/20 1800  ciprofloxacin (CIPRO)  IVPB 400 mg  Status:  Discontinued        400 mg 200 mL/hr over 60 Minutes Intravenous Every 8 hours 12/12/20 1620 12/21/20 1529   12/12/20 1700  ciprofloxacin (CIPRO) IVPB 400 mg  Status:   Discontinued        400 mg 200 mL/hr over 60 Minutes Intravenous Every 12 hours 12/12/20 1609 12/12/20 1620   12/12/20 1615  vancomycin variable dose per unstable renal function (pharmacist dosing)  Status:  Discontinued         Does not apply See admin instructions 12/12/20 1615 12/13/20 1050   12/12/20 1100  vancomycin (VANCOREADY) IVPB 1750 mg/350 mL        1,750 mg 175 mL/hr over 120 Minutes Intravenous  Once 12/12/20 0957 12/12/20 1301   12/12/20 0600  ceFEPIme (MAXIPIME) 2 g in sodium chloride 0.9 % 100 mL IVPB        2 g 200 mL/hr over 30 Minutes Intravenous Every 8 hours 12/11/20 1929     12/11/20 1522  vancomycin variable dose per unstable renal function (pharmacist dosing)  Status:  Discontinued         Does not apply See admin instructions 12/11/20 1522 12/12/20 1607   12/11/20 0900  vancomycin (VANCOREADY) IVPB 1000 mg/200 mL  Status:  Discontinued        1,000 mg 200 mL/hr over 60 Minutes Intravenous Every 12 hours 12/11/20 0813 12/11/20 1522   12/10/20 1515  meropenem (MERREM) 1 g in sodium chloride 0.9 % 100 mL IVPB        1 g 200 mL/hr over 30 Minutes Intravenous Every 8 hours 12/10/20 1418 12/12/20 0834   12/09/20 1000  ceFEPIme (MAXIPIME) 2 g in sodium chloride 0.9 % 100 mL IVPB  Status:  Discontinued        2 g 200 mL/hr over 30 Minutes Intravenous Every 8 hours 12/09/20 0811 12/10/20 1418   12/09/20 0730  vancomycin (VANCOREADY) IVPB 2000 mg/400 mL        2,000 mg 200 mL/hr over 120 Minutes Intravenous  Once 12/09/20 0644 12/09/20 1616   12/09/20 0600  vancomycin (VANCOREADY) IVPB 1750 mg/350 mL  Status:  Discontinued        1,750 mg 175 mL/hr over 120 Minutes Intravenous Every 24 hours 12/08/20 0809 12/08/20 1229   12/08/20 2200  ceFEPIme (MAXIPIME) 2 g in sodium chloride 0.9 % 100 mL IVPB  Status:  Discontinued        2 g 200 mL/hr over 30 Minutes Intravenous Every 12 hours 12/08/20 0809 12/09/20 0811   12/08/20 1700  vancomycin (VANCOREADY) IVPB 1500 mg/300 mL   Status:  Discontinued        1,500 mg 150 mL/hr over 120 Minutes Intravenous Every 12 hours 12/08/20 0500 12/08/20 0809   12/08/20 1230  vancomycin variable dose per unstable renal function (pharmacist dosing)  Status:  Discontinued         Does not apply See admin instructions 12/08/20 1230 12/11/20 1359   12/08/20 0545  ceFEPIme (MAXIPIME) 2 g in sodium chloride 0.9 % 100 mL IVPB  Status:  Discontinued        2 g 200 mL/hr over 30 Minutes Intravenous Every 8 hours 12/08/20 0446 12/08/20 0809   12/08/20 0530  vancomycin (VANCOREADY) IVPB 2000 mg/400 mL       "Followed by" Linked Group Details   2,000 mg 200 mL/hr over 120 Minutes Intravenous  Once 12/08/20 0441 12/08/20 0815   12/08/20 0530  vancomycin (VANCOREADY) IVPB 500 mg/100 mL  Status:  Discontinued       "Followed by" Linked Group Details   500 mg 100 mL/hr over 60 Minutes Intravenous  Once 12/08/20 0441 12/08/20 0803   12/07/20 2215  piperacillin-tazobactam (ZOSYN) IVPB 3.375 g  Status:  Discontinued        3.375 g 12.5 mL/hr over 240 Minutes Intravenous Every 8 hours 12/07/20 2124 12/08/20 0447   12/07/20 1800  ceFEPIme (MAXIPIME) 2 g in sodium chloride 0.9 % 100 mL IVPB        2 g 200 mL/hr over 30 Minutes Intravenous  Once 12/07/20 1757 12/07/20 2036   12/07/20 1800  vancomycin (VANCOCIN) IVPB 1000 mg/200 mL premix        1,000 mg 200 mL/hr over 60 Minutes Intravenous  Once 12/07/20 1757 12/07/20 1942         Objective   Blood pressure 116/65, pulse 89, temperature 99.4 F (37.4 C), temperature source Axillary, resp. rate (!) 40, height 6' 5.01" (1.956 m), weight 123.4 kg, SpO2 96 %.    Vent Mode: PRVC FiO2 (%):  [40 %] 40 % Set Rate:  [16 bmp] 16 bmp Vt Set:  [500 mL] 500 mL PEEP:  [5 cmH20] 5 cmH20   Intake/Output Summary (Last 24 hours) at 12/23/2020 0735 Last data filed at 12/23/2020 0700 Gross per 24 hour  Intake 2559.34 ml  Output 6400 ml  Net -3840.66 ml   Filed Weights   12/20/20 0455 12/21/20 0500  12/23/20 0500  Weight: 130 kg 130 kg 123.4 kg      REVIEW OF SYSTEMS  PATIENT IS UNABLE TO PROVIDE COMPLETE REVIEW OF SYSTEMS DUE TO SEVERE CRITICAL ILLNESS AND TOXIC METABOLIC ENCEPHALOPATHY   PHYSICAL EXAMINATION:  GENERAL:critically ill appearing, +resp distress HEAD: Normocephalic, atraumatic.  EYES: Pupils equal, round, reactive to light.  No scleral icterus.  MOUTH: Moist mucosal membrane. NECK: Supple. No thyromegaly. No nodules. No JVD.  PULMONARY: +rhonchi, +wheezing CARDIOVASCULAR: S1 and S2. Regular rate and rhythm. No murmurs, rubs, or gallops.  GASTROINTESTINAL: Soft, nontender, -distended. Positive bowel sounds.  MUSCULOSKELETAL: No swelling, clubbing, or edema.  NEUROLOGIC: obtunded SKIN:intact,warm,dry   Labs/imaging that I havepersonally reviewed  (right click and "Reselect all SmartList Selections" daily)    Resolved Hospital Problem list   n/a    ASSESSMENT AND PLAN SYNOPSIS  70 yo quadriplegic with multiple mecial issues with acute hypoxic resp failure due to severe polymicrobial sepsis and septic shock  from pseudomonal infections of lungs and sacral decub   Severe ACUTE Hypoxic and Hypercapnic Respiratory Failure -continue Mechanical Ventilator support -continue Bronchodilator Therapy -Wean Fio2 and PEEP as tolerated -VAP/VENT bundle implementation +sepsis and mucus plugging S/p bronch - many GPCs , cleared bilateral airways  He did NOT tolerate PS 5/5 mode back on PRVC mode  Vent Mode: PRVC FiO2 (%):  [40 %] 40 % Set Rate:  [16 bmp] 16 bmp Vt Set:  [500 mL] 500 mL PEEP:  [5 cmH20] 5 QQI29  ACUTE DAISTOLIC CARDIAC FAILURE- bradyarrythmias likley related to neurogenic causes -oxygen as needed -Lasix as tolerated -follow up cardiac enzymes as indicated -follow up cardiology recs  CARDIAC ICU monitoring   ACUTE KIDNEY INJURY/Renal Failure -continue Foley Catheter-assess need -Avoid nephrotoxic agents -Follow urine output,  BMP -Ensure adequate renal perfusion, optimize oxygenation -Renal dose medications  Intake/Output Summary (Last 24 hours) at 12/23/2020 0741 Last data filed at 12/23/2020 0700 Gross per 24 hour  Intake 2559.34 ml  Output 6400 ml  Net -3840.66 ml     NEUROLOGY Acute toxic metabolic encephalopathy, need for sedation Goal RASS -2 to -3   SEPTIC SHOCK -use vasopressors to keep MAP>65 as needed -follow ABG and LA -follow up cultures -emperic ABX -consider stress dose steroids -aggressive IV fluid resuscitation  INFECTIOUS DISEASE -continue antibiotics as prescribed -follow up cultures -follow up ID consultation  ENDO - ICU hypoglycemic\Hyperglycemia protocol -check FSBS per protocol   GI GI PROPHYLAXIS as indicated  NUTRITIONAL STATUS DIET-->TF's as tolerated Constipation protocol as indicated   ELECTROLYTES -follow labs as needed -replace as needed -pharmacy consultation and following     Best practice (evaluated daily)  Diet:  NPO Pain/Anxiety/Delirium protocol (if indicated): Yes (RASS goal 0) VAP protocol (if indicated): Yes DVT prophylaxis: Systemic AC GI prophylaxis: H2B Glucose control:  SSI No Central venous access:  Yes, and it is still needed Arterial line:  N/A Foley:  Yes, and it is still needed Mobility:  bed rest  PT consulted: N/A Last date of multidisciplinary goals of care discussion [N/A]  Code Status:  full code Disposition: ICU     Labs   CBC: Recent Labs  Lab 12/19/20 0515 12/20/20 0430 12/21/20 0441 12/22/20 0433 12/23/20 0421  WBC 8.0 7.4 6.8 7.3 7.7  HGB 8.0* 8.1* 7.7* 7.5* 7.8*  HCT 25.2* 24.9* 24.0* 23.8* 25.2*  MCV 88.7 88.6 90.6 91.5 91.6  PLT 263 273 268 272 465    Basic Metabolic Panel: Recent Labs  Lab 12/19/20 0515 12/20/20 0430 12/21/20 0441 12/22/20 0433 12/23/20 0421  NA 139 142 143 145 146*  K 3.5 3.4* 3.8 3.5 3.8  CL 104 108 108 110 108  CO2 _0 GLUCOSE 138* 146* 133* 145* 124*   BUN 34* 32* 33* 37* 45*  CREATININE 0.56* 0.55* 0.49* 0.64 0.63  CALCIUM 9.3 9.3 9.5 9.6 9.7  MG 2.1 2.0 2.3 2.5* 2.2  PHOS 1.9* 2.5 2.4* 2.9 2.8   GFR: Estimated Creatinine Clearance: 128.5 mL/min (by C-G formula based on SCr of 0.63 mg/dL). Recent Labs  Lab 12/20/20 0430 12/21/20 0441 12/22/20 0433 12/23/20 0421  WBC 7.4 6.8 7.3 7.7    Liver Function Tests: No results for input(s): AST, ALT, ALKPHOS, BILITOT, PROT, ALBUMIN in the last 168 hours. No results for input(s): LIPASE, AMYLASE in the last 168 hours. No results for input(s): AMMONIA in the last 168 hours.  ABG    Component Value Date/Time   PHART 7.38 12/08/2020 0500   PCO2ART 42 12/08/2020 0500   PO2ART 115 (H) 12/08/2020 0500   HCO3 24.8 12/08/2020 0500   ACIDBASEDEF 0.4 12/08/2020 0500   O2SAT 98.4 12/08/2020 0500     Coagulation Profile: No results for input(s): INR, PROTIME in the last 168 hours.  Cardiac Enzymes: No results for input(s): CKTOTAL, CKMB, CKMBINDEX, TROPONINI in the last 168 hours.  HbA1C: Hgb A1c MFr Bld  Date/Time Value Ref Range Status  06/09/2020 07:00 AM 5.8 (H) 4.8 - 5.6 % Final    Comment:    (NOTE) Pre diabetes:          5.7%-6.4%  Diabetes:              >6.4%  Glycemic control for   <7.0% adults with diabetes     CBG: Recent Labs  Lab 12/22/20 1119 12/22/20 1555 12/22/20 1934 12/22/20 2329 12/23/20 0334  GLUCAP 108* 117* 132* 100* 125*    Allergies Allergies  Allergen Reactions  . Shrimp (Diagnostic)  Experiences facial droop when eating seafood       DVT/GI PRX  assessed I Assessed the need for Labs I Assessed the need for Foley I Assessed the need for Central Venous Line Family Discussion when available I Assessed the need for Mobilization I made an Assessment of medications to be adjusted accordingly Safety Risk assessment completed  CASE DISCUSSED IN MULTIDISCIPLINARY ROUNDS WITH ICU TEAM     Critical Care Time devoted to patient  care services described in this note is 48 minutes.   Overall, patient is critically ill, prognosis is guarded.  Patient with Multiorgan failure and at high risk for cardiac arrest and death.    Corrin Parker, M.D.  [facility]  Medical Director [place] Director [facility] Cardio-Pulmonary Department

## 2020-12-23 NOTE — Progress Notes (Signed)
Patients daughter, [name], updated. All questions answered.

## 2020-12-24 ENCOUNTER — Inpatient Hospital Stay: Payer: Medicare Other

## 2020-12-24 DIAGNOSIS — R6521 Severe sepsis with septic shock: Secondary | ICD-10-CM | POA: Diagnosis not present

## 2020-12-24 DIAGNOSIS — J9601 Acute respiratory failure with hypoxia: Secondary | ICD-10-CM | POA: Diagnosis not present

## 2020-12-24 DIAGNOSIS — G8250 Quadriplegia, unspecified: Secondary | ICD-10-CM | POA: Diagnosis not present

## 2020-12-24 DIAGNOSIS — A419 Sepsis, unspecified organism: Secondary | ICD-10-CM | POA: Diagnosis not present

## 2020-12-24 LAB — BASIC METABOLIC PANEL
Anion gap: 6 (ref 5–15)
BUN: 51 mg/dL — ABNORMAL HIGH (ref 8–23)
CO2: 34 mmol/L — ABNORMAL HIGH (ref 22–32)
Calcium: 10 mg/dL (ref 8.9–10.3)
Chloride: 108 mmol/L (ref 98–111)
Creatinine, Ser: 0.76 mg/dL (ref 0.61–1.24)
GFR, Estimated: >60 mL/min (ref >60)
Glucose, Bld: 149 mg/dL — ABNORMAL HIGH (ref 70–99)
Potassium: 3.2 mmol/L — ABNORMAL LOW (ref 3.5–5.1)
Sodium: 148 mmol/L — ABNORMAL HIGH (ref 135–145)

## 2020-12-24 LAB — PHOSPHORUS: Phosphorus: 3.4 mg/dL (ref 2.5–4.6)

## 2020-12-24 LAB — MAGNESIUM: Magnesium: 2.5 mg/dL — ABNORMAL HIGH (ref 1.7–2.4)

## 2020-12-24 LAB — CBC
HCT: 26 % — ABNORMAL LOW (ref 39.0–52.0)
Hemoglobin: 8 g/dL — ABNORMAL LOW (ref 13.0–17.0)
MCH: 28.8 pg (ref 26.0–34.0)
MCHC: 30.8 g/dL (ref 30.0–36.0)
MCV: 93.5 fL (ref 80.0–100.0)
Platelets: 316 10*3/uL (ref 150–400)
RBC: 2.78 MIL/uL — ABNORMAL LOW (ref 4.22–5.81)
RDW: 17.5 % — ABNORMAL HIGH (ref 11.5–15.5)
WBC: 9.7 10*3/uL (ref 4.0–10.5)
nRBC: 0 % (ref 0.0–0.2)

## 2020-12-24 LAB — GLUCOSE, CAPILLARY
Glucose-Capillary: 142 mg/dL — ABNORMAL HIGH (ref 70–99)
Glucose-Capillary: 121 mg/dL — ABNORMAL HIGH (ref 70–99)
Glucose-Capillary: 150 mg/dL — ABNORMAL HIGH (ref 70–99)
Glucose-Capillary: 117 mg/dL — ABNORMAL HIGH (ref 70–99)

## 2020-12-24 IMAGING — DX DG CHEST 1V PORT
1 series · 1 of 1 positions shown · non-contrast
Comparison: Portable chest [DATE] and earlier.

CLINICAL DATA: 69-year-old male with respiratory failure, sepsis.

EXAM:
PORTABLE CHEST 1 VIEW

[chest ap]
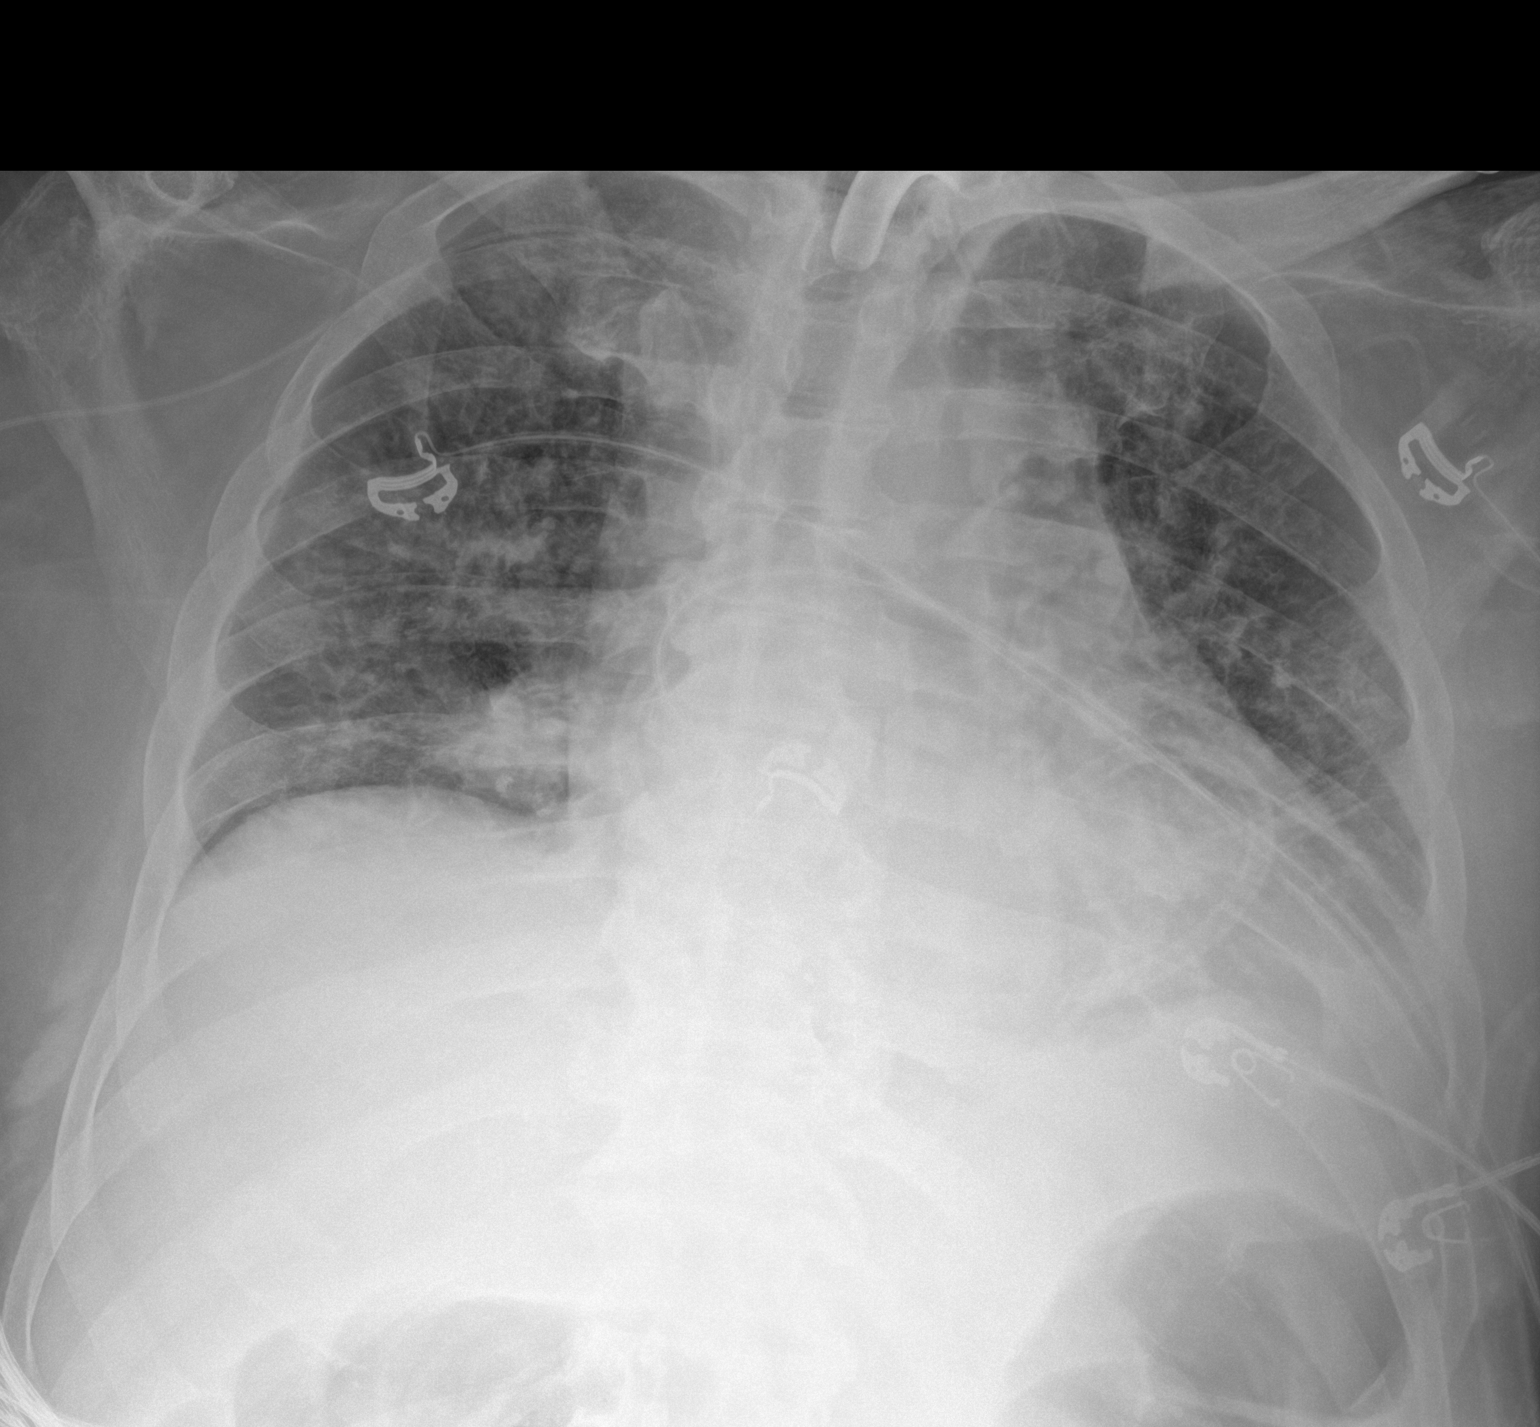

[1 of 1 positions shown; findings below may reference images not displayed]

FINDINGS: Portable AP semi upright view at [CN] hours. Stable tracheostomy,
right PICC line. Larger lung volumes and regressed patchy perihilar
opacity from last month. Stable cardiac size and mediastinal
contours. No pneumothorax, pulmonary edema. No definite pleural
effusion. No areas of worsening ventilation. Negative visible bowel
gas. No acute osseous abnormality identified.
IMPRESSION: 1. Improved lung volumes with regressed but not resolved perihilar
opacity since [DATE]. Favor resolving infection.
2. No new cardiopulmonary abnormality.

## 2020-12-24 MED ORDER — GLYCOPYRROLATE 0.2 MG/ML IJ SOLN
0.1000 mg | Freq: Three times a day (TID) | INTRAMUSCULAR | Status: DC | PRN
  Administered 2020-12-24 – 2020-12-31 (×7): 0.1 mg via INTRAVENOUS
  Filled 2020-12-24 (×7): qty 1

## 2020-12-24 MED ORDER — POTASSIUM CHLORIDE 20 MEQ PO PACK
20.0000 meq | PACK | ORAL | Status: AC
  Administered 2020-12-24 (×2): 20 meq
  Filled 2020-12-24 (×2): qty 1

## 2020-12-24 MED ORDER — POTASSIUM CHLORIDE 10 MEQ/50ML IV SOLN
10.0000 meq | INTRAVENOUS | Status: AC
  Administered 2020-12-24 (×4): 10 meq via INTRAVENOUS
  Filled 2020-12-24 (×4): qty 50

## 2020-12-24 NOTE — Progress Notes (Signed)
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 According to pts family last week pt noted to have sediment in his urine at home concerning for UTI. He had a foley catheter placed 2 weeks ago and completed a 7 day course of abx therapy due to UTI. Due to concerns of recurrent UTI pts family contacted pts PCP office last week, and heard from his PCP this week and were awaiting urine culture results. However, pt noted to have hematuria yesterday and today along with sinus tachycardia hr 120's. Pts daughter is a Marine scientist and due to concern of sepsis pt transported to the ER via EMS for further evaluation and treatment.    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 C5 Cervical Fracture (following a mechanical fall at home) s/p C3-6 Laminectomy and Posterior Instrumentation and Fusion 08/4  Quadriplegia  Spinal Cord Compression  Chronic Tracheostomy and PEG   Significant Hospital Events: Including procedures, antibiotic start and stop dates in addition to other pertinent events    Significant Hospital Events: Including procedures, antibiotic start and stop dates in addition to other pertinent events   03/17: Pt admitted to ICU with urosepsis requiring vasopressors and acute on chronic hypoxic respiratory failure secondary to mucous plugging requiring exchange of cuffless trach to size 6 mm cuffed trach to be placed on mechanical ventilation  03/17: Right femoral central lineplaced by ER physician  03/17: CT Abd Pelvis findings with cystitis. Correlation with urinalysis is Recommended. Mild to moderate severity bibasilar atelectasis and/or Infiltrate. Sigmoid diverticulosis. Sacral decubitus ulcer, as described above. MRI correlation is recommended, as sequelae  associated with acute osteomyelitis cannot be excluded. Aortic atherosclerosis. 03/17: CT Head revealed mild chronic ischemic white matter disease. No acute intracranial abnormality seen 03/17: Vancomycin x1 dose and Cefepime x1 dose 03/17: Zosyn>>completed 03/27: On Cefepime 3/30severe bradycardia with decrease in PS mode 3/31 patient with increased WOB placed on full vent support 4/1 remains on vent,+pneumonia, failure to wean from vent 4/2 severe resp distress, on full vent support   Micro 12/07/2020 blood culture has methicillin-resistant staph aureus, Enterobacter cloacae and Enterococcus faecalis  12/13/2020 blood culture: No growth so far  12/08/2020  bronchoalveolar lavage more than 100,000 colonies of Pseudomonas aeruginosa Methicillin-resistant staph aureus 40,000 colonies Enterococcus faecalis 20,000.  12/07/2020 urine culture Enterobacter cloacae   Sacral wound has rare Pseudomonas aeruginosa rare methicillin-resistant staph aureus few Bacteroides and rare Candida albicans. Likely stool contamination  Anti-infectives (From admission, onward)   Start     Dose/Rate Route Frequency Ordered Stop   12/20/20 2000  vancomycin (VANCOREADY) IVPB 500 mg/100 mL        500 mg 100 mL/hr over 60 Minutes Intravenous Every 12 hours 12/20/20 1709     12/17/20 1400  vancomycin (VANCOREADY) IVPB 750 mg/150 mL  Status:  Discontinued        750 mg 150 mL/hr over 60 Minutes Intravenous Every 12 hours 12/17/20 1159 12/20/20 0604   12/13/20 1000  vancomycin (VANCOREADY) IVPB 1000 mg/200 mL  Status:  Discontinued        1,000 mg 200 mL/hr over 60 Minutes Intravenous Every 12 hours 12/13/20 0832 12/17/20 1159   12/12/20 1800  ciprofloxacin (CIPRO) IVPB 400 mg  Status:  Discontinued        400 mg 200 mL/hr  over 60 Minutes Intravenous Every 8 hours 12/12/20 1620 12/21/20 1529   12/12/20 1700  ciprofloxacin (CIPRO) IVPB 400 mg  Status:  Discontinued        400 mg 200 mL/hr over 60  Minutes Intravenous Every 12 hours 12/12/20 1609 12/12/20 1620   12/12/20 1615  vancomycin variable dose per unstable renal function (pharmacist dosing)  Status:  Discontinued         Does not apply See admin instructions 12/12/20 1615 12/13/20 1050   12/12/20 1100  vancomycin (VANCOREADY) IVPB 1750 mg/350 mL        1,750 mg 175 mL/hr over 120 Minutes Intravenous  Once 12/12/20 0957 12/12/20 1301   12/12/20 0600  ceFEPIme (MAXIPIME) 2 g in sodium chloride 0.9 % 100 mL IVPB        2 g 200 mL/hr over 30 Minutes Intravenous Every 8 hours 12/11/20 1929     12/11/20 1522  vancomycin variable dose per unstable renal function (pharmacist dosing)  Status:  Discontinued         Does not apply See admin instructions 12/11/20 1522 12/12/20 1607   12/11/20 0900  vancomycin (VANCOREADY) IVPB 1000 mg/200 mL  Status:  Discontinued        1,000 mg 200 mL/hr over 60 Minutes Intravenous Every 12 hours 12/11/20 0813 12/11/20 1522   12/10/20 1515  meropenem (MERREM) 1 g in sodium chloride 0.9 % 100 mL IVPB        1 g 200 mL/hr over 30 Minutes Intravenous Every 8 hours 12/10/20 1418 12/12/20 0834   12/09/20 1000  ceFEPIme (MAXIPIME) 2 g in sodium chloride 0.9 % 100 mL IVPB  Status:  Discontinued        2 g 200 mL/hr over 30 Minutes Intravenous Every 8 hours 12/09/20 0811 12/10/20 1418   12/09/20 0730  vancomycin (VANCOREADY) IVPB 2000 mg/400 mL        2,000 mg 200 mL/hr over 120 Minutes Intravenous  Once 12/09/20 0644 12/09/20 1616   12/09/20 0600  vancomycin (VANCOREADY) IVPB 1750 mg/350 mL  Status:  Discontinued        1,750 mg 175 mL/hr over 120 Minutes Intravenous Every 24 hours 12/08/20 0809 12/08/20 1229   12/08/20 2200  ceFEPIme (MAXIPIME) 2 g in sodium chloride 0.9 % 100 mL IVPB  Status:  Discontinued        2 g 200 mL/hr over 30 Minutes Intravenous Every 12 hours 12/08/20 0809 12/09/20 0811   12/08/20 1700  vancomycin (VANCOREADY) IVPB 1500 mg/300 mL  Status:  Discontinued        1,500 mg 150  mL/hr over 120 Minutes Intravenous Every 12 hours 12/08/20 0500 12/08/20 0809   12/08/20 1230  vancomycin variable dose per unstable renal function (pharmacist dosing)  Status:  Discontinued         Does not apply See admin instructions 12/08/20 1230 12/11/20 1359   12/08/20 0545  ceFEPIme (MAXIPIME) 2 g in sodium chloride 0.9 % 100 mL IVPB  Status:  Discontinued        2 g 200 mL/hr over 30 Minutes Intravenous Every 8 hours 12/08/20 0446 12/08/20 0809   12/08/20 0530  vancomycin (VANCOREADY) IVPB 2000 mg/400 mL       "Followed by" Linked Group Details   2,000 mg 200 mL/hr over 120 Minutes Intravenous  Once 12/08/20 0441 12/08/20 0815   12/08/20 0530  vancomycin (VANCOREADY) IVPB 500 mg/100 mL  Status:  Discontinued       "Followed by"  Linked Group Details   500 mg 100 mL/hr over 60 Minutes Intravenous  Once 12/08/20 0441 12/08/20 0803   12/07/20 2215  piperacillin-tazobactam (ZOSYN) IVPB 3.375 g  Status:  Discontinued        3.375 g 12.5 mL/hr over 240 Minutes Intravenous Every 8 hours 12/07/20 2124 12/08/20 0447   12/07/20 1800  ceFEPIme (MAXIPIME) 2 g in sodium chloride 0.9 % 100 mL IVPB        2 g 200 mL/hr over 30 Minutes Intravenous  Once 12/07/20 1757 12/07/20 2036   12/07/20 1800  vancomycin (VANCOCIN) IVPB 1000 mg/200 mL premix        1,000 mg 200 mL/hr over 60 Minutes Intravenous  Once 12/07/20 1757 12/07/20 1942         Interim History / Subjective:  Severe resp failure Remains critically ill Failure to wean from vent Remains on full vent support      Objective   Blood pressure 123/73, pulse 78, temperature (!) 96.4 F (35.8 C), temperature source Axillary, resp. rate (!) 23, height 6' 5.01" (1.956 m), weight 123.4 kg, SpO2 95 %.    Vent Mode: PRVC FiO2 (%):  [40 %] 40 % Set Rate:  [16 bmp] 16 bmp Vt Set:  [500 mL] 500 mL PEEP:  [5 cmH20] 5 cmH20 Plateau Pressure:  [11 cmH20-12 cmH20] 11 cmH20   Intake/Output Summary (Last 24 hours) at 12/24/2020 0713 Last  data filed at 12/24/2020 0600 Gross per 24 hour  Intake 2101.03 ml  Output 5650 ml  Net -3548.97 ml   Filed Weights   12/20/20 0455 12/21/20 0500 12/23/20 0500  Weight: 130 kg 130 kg 123.4 kg      REVIEW OF SYSTEMS  PATIENT IS UNABLE TO PROVIDE COMPLETE REVIEW OF SYSTEMS DUE TO SEVERE CRITICAL ILLNESS AND TOXIC METABOLIC ENCEPHALOPATHY   PHYSICAL EXAMINATION:  GENERAL:critically ill appearing, +resp distress HEAD: Normocephalic, atraumatic.  EYES: Pupils equal, round, reactive to light.  No scleral icterus.  MOUTH: Moist mucosal membrane. NECK: Supple. No thyromegaly. No nodules. No JVD.  PULMONARY: +rhonchi, +wheezing CARDIOVASCULAR: S1 and S2. Regular rate and rhythm. No murmurs, rubs, or gallops.  GASTROINTESTINAL: Soft, nontender, -distended. Positive bowel sounds.  MUSCULOSKELETAL: No swelling, clubbing, or edema.  NEUROLOGIC: obtunded SKIN:intact,warm,dry   Labs/imaging that I havepersonally reviewed  (right click and "Reselect all SmartList Selections" daily)    Resolved Hospital Problem list   NA   ASSESSMENT AND PLAN SYNOPSIS   70 yo quadriplegic with multiple mecial issues with acute hypoxic resp failure due to severe polymicrobial sepsis and septic shockfrom pseudomonal infections of lungs and sacral decub  Failure to wean from vent   Severe ACUTE Hypoxic and Hypercapnic Respiratory Failure -continue Mechanical Ventilator support -continue Bronchodilator Therapy -Wean Fio2 and PEEP as tolerated -VAP/VENT bundle implementation -will perform SAT/SBT when respiratory parameters are met consider starting steroids Consider lasix therapy   CARDIAC FAILURE-acute diastolic dysfunction -oxygen as needed -Lasix as tolerated -follow up cardiac enzymes as indicated   CARDIAC ICU monitoring   ACUTE KIDNEY INJURY/Renal Failure -continue Foley Catheter-assess need -Avoid nephrotoxic agents -Follow urine output, BMP -Ensure adequate renal perfusion,  optimize oxygenation -Renal dose medications  Intake/Output Summary (Last 24 hours) at 12/24/2020 0719 Last data filed at 12/24/2020 0600 Gross per 24 hour  Intake 2101.03 ml  Output 5650 ml  Net -3548.97 ml     NEUROLOGY Acute toxic metabolic encephalopathy Sedation as needed   SEPTIC SHOCK -use vasopressors to keep MAP>65 as needed   INFECTIOUS  DISEASE -continue antibiotics as prescribed -follow up cultures -follow up ID consultation  ENDO - ICU hypoglycemic\Hyperglycemia protocol -check FSBS per protocol   GI GI PROPHYLAXIS as indicated  NUTRITIONAL STATUS DIET-->TF's as tolerated Constipation protocol as indicated   ELECTROLYTES -follow labs as needed -replace as needed -pharmacy consultation and following      Best practice (evaluated daily)  Diet: NPO Pain/Anxiety/Delirium protocol (if indicated): Yes (RASS goal 0) VAP protocol (if indicated): Yes DVT prophylaxis: Systemic AC GI prophylaxis: H2B Glucose control: SSI No Central venous access: Yes, and it is still needed Arterial line: N/A Foley: Yes, and it is still needed Mobility: bed rest PT consulted: N/A Last date of multidisciplinary goals of care discussion[N/A] Code Status: full code Disposition:ICU     Labs   CBC: Recent Labs  Lab 12/20/20 0430 12/21/20 0441 12/22/20 0433 12/23/20 0421 12/24/20 0416  WBC 7.4 6.8 7.3 7.7 9.7  HGB 8.1* 7.7* 7.5* 7.8* 8.0*  HCT 24.9* 24.0* 23.8* 25.2* 26.0*  MCV 88.6 90.6 91.5 91.6 93.5  PLT 273 268 272 331 841    Basic Metabolic Panel: Recent Labs  Lab 12/20/20 0430 12/21/20 0441 12/22/20 0433 12/23/20 0421 12/24/20 0416  NA 142 143 145 146* 148*  K 3.4* 3.8 3.5 3.8 3.2*  CL 108 108 110 108 108  CO2 _0 34*  GLUCOSE 146* 133* 145* 124* 149*  BUN 32* 33* 37* 45* 51*  CREATININE 0.55* 0.49* 0.64 0.63 0.76  CALCIUM 9.3 9.5 9.6 9.7 10.0  MG 2.0 2.3 2.5* 2.2 2.5*  PHOS 2.5 2.4* 2.9 2.8 3.4   GFR: Estimated  Creatinine Clearance: 128.5 mL/min (by C-G formula based on SCr of 0.76 mg/dL). Recent Labs  Lab 12/21/20 0441 12/22/20 0433 12/23/20 0421 12/24/20 0416  WBC 6.8 7.3 7.7 9.7    Liver Function Tests: No results for input(s): AST, ALT, ALKPHOS, BILITOT, PROT, ALBUMIN in the last 168 hours. No results for input(s): LIPASE, AMYLASE in the last 168 hours. No results for input(s): AMMONIA in the last 168 hours.  ABG    Component Value Date/Time   PHART 7.38 12/08/2020 0500   PCO2ART 42 12/08/2020 0500   PO2ART 115 (H) 12/08/2020 0500   HCO3 24.8 12/08/2020 0500   ACIDBASEDEF 0.4 12/08/2020 0500   O2SAT 98.4 12/08/2020 0500     Coagulation Profile: No results for input(s): INR, PROTIME in the last 168 hours.  Cardiac Enzymes: No results for input(s): CKTOTAL, CKMB, CKMBINDEX, TROPONINI in the last 168 hours.  HbA1C: Hgb A1c MFr Bld  Date/Time Value Ref Range Status  06/09/2020 07:00 AM 5.8 (H) 4.8 - 5.6 % Final    Comment:    (NOTE) Pre diabetes:          5.7%-6.4%  Diabetes:              >6.4%  Glycemic control for   <7.0% adults with diabetes     CBG: Recent Labs  Lab 12/22/20 2329 12/23/20 0334 12/23/20 0740 12/23/20 1136 12/24/20 0332  GLUCAP 100* 125* 129* 116* 142*    Allergies Allergies  Allergen Reactions  . Shrimp (Diagnostic)     Experiences facial droop when eating seafood       DVT/GI PRX  assessed I Assessed the need for Labs I Assessed the need for Foley I Assessed the need for Central Venous Line Family Discussion when available I Assessed the need for Mobilization I made an Assessment of medications to be adjusted accordingly Safety Risk assessment  completed  CASE DISCUSSED IN MULTIDISCIPLINARY ROUNDS WITH ICU TEAM     Critical Care Time devoted to patient care services described in this note is 47 minutes.   Overall, patient is critically ill, prognosis is guarded.  Patient with Multiorgan failure and at high risk for  cardiac arrest and death.   Very poor chance of meaningful recovery  Corrin Parker, M.D.  [facility]  Medical Director [place] Director [place] Department

## 2020-12-24 NOTE — Progress Notes (Signed)
Patients daughter, [name],  updated. All questions and concerns addressed.

## 2020-12-24 NOTE — Progress Notes (Signed)
Patients wife, [name], updated. All questions and concerns addressed.

## 2020-12-24 NOTE — Consult Note (Signed)
[place] for Electrolyte Monitoring and Replacement   Recent Labs: Potassium (mmol/L)  Date Value  12/24/2020 3.2 (L)   Magnesium (mg/dL)  Date Value  12/24/2020 2.5 (H)   Calcium (mg/dL)  Date Value  12/24/2020 10.0   Albumin (g/dL)  Date Value  12/07/2020 3.1 (L)  02/01/2020 4.4   Phosphorus (mg/dL)  Date Value  12/24/2020 3.4   Sodium (mmol/L)  Date Value  12/24/2020 148 (H)  02/01/2020 143    Assessment: Patient is a 70 y/o M presented with sepsis. Pt presented with complaints of recurrent UTI and hematuria. 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   Pt has chronic tracheostomy and PEG tube. PMH includes HTN and HLD.   Pharmacy has been consulted to assist with electrolyte monitoring and replacement as indicated.   Scr now around baseline (~0.5):  0.51 > 0.56 > 0.55 > 0.49 > 0.64>0.76  Nutrition: tube feeds @ 70 mL/hr, prosource tube feeds once daily   MIVF: Free water at 200 mL Q4h (1200 mL/day)   Lasix 20 mg BID started 4/1   Goal of Therapy:  Electrolytes within normal limits (K > 4)  Plan:  -- Na 146>148 (trending up): Continue free water 200 per tube q4h (1200 mL/day)      -- K 3.2 on lasix: give 20 mEq KCl per tube x 2 doses, IV KCl 85meq x 4   -- Follow-up electrolytes with AM labs  Lu Duffel, PharmD, BCPS Clinical Pharmacist 12/24/2020 8:49 AM

## 2020-12-25 DIAGNOSIS — R140 Abdominal distension (gaseous): Secondary | ICD-10-CM | POA: Diagnosis not present

## 2020-12-25 DIAGNOSIS — J9621 Acute and chronic respiratory failure with hypoxia: Secondary | ICD-10-CM | POA: Diagnosis not present

## 2020-12-25 DIAGNOSIS — J690 Pneumonitis due to inhalation of food and vomit: Secondary | ICD-10-CM

## 2020-12-25 DIAGNOSIS — G8250 Quadriplegia, unspecified: Secondary | ICD-10-CM | POA: Diagnosis not present

## 2020-12-25 DIAGNOSIS — G909 Disorder of the autonomic nervous system, unspecified: Secondary | ICD-10-CM | POA: Diagnosis not present

## 2020-12-25 DIAGNOSIS — B965 Pseudomonas (aeruginosa) (mallei) (pseudomallei) as the cause of diseases classified elsewhere: Secondary | ICD-10-CM | POA: Diagnosis not present

## 2020-12-25 DIAGNOSIS — A4102 Sepsis due to Methicillin resistant Staphylococcus aureus: Secondary | ICD-10-CM | POA: Diagnosis not present

## 2020-12-25 DIAGNOSIS — A419 Sepsis, unspecified organism: Secondary | ICD-10-CM | POA: Diagnosis not present

## 2020-12-25 DIAGNOSIS — Z7189 Other specified counseling: Secondary | ICD-10-CM

## 2020-12-25 LAB — BASIC METABOLIC PANEL
Anion gap: 6 (ref 5–15)
BUN: 60 mg/dL — ABNORMAL HIGH (ref 8–23)
CO2: 33 mmol/L — ABNORMAL HIGH (ref 22–32)
Calcium: 10.1 mg/dL (ref 8.9–10.3)
Chloride: 112 mmol/L — ABNORMAL HIGH (ref 98–111)
Creatinine, Ser: 0.76 mg/dL (ref 0.61–1.24)
GFR, Estimated: >60 mL/min (ref >60)
Glucose, Bld: 159 mg/dL — ABNORMAL HIGH (ref 70–99)
Potassium: 3.5 mmol/L (ref 3.5–5.1)
Sodium: 151 mmol/L — ABNORMAL HIGH (ref 135–145)

## 2020-12-25 LAB — CBC WITH DIFFERENTIAL/PLATELET
Abs Immature Granulocytes: 0.02 10*3/uL (ref 0.00–0.07)
Basophils Absolute: 0 10*3/uL (ref 0.0–0.1)
Basophils Relative: 0 %
Eosinophils Absolute: 0.3 10*3/uL (ref 0.0–0.5)
Eosinophils Relative: 4 %
HCT: 26.6 % — ABNORMAL LOW (ref 39.0–52.0)
Hemoglobin: 8.2 g/dL — ABNORMAL LOW (ref 13.0–17.0)
Immature Granulocytes: 0 %
Lymphocytes Relative: 14 %
Lymphs Abs: 1.2 10*3/uL (ref 0.7–4.0)
MCH: 29.1 pg (ref 26.0–34.0)
MCHC: 30.8 g/dL (ref 30.0–36.0)
MCV: 94.3 fL (ref 80.0–100.0)
Monocytes Absolute: 1.3 10*3/uL — ABNORMAL HIGH (ref 0.1–1.0)
Monocytes Relative: 15 %
Neutro Abs: 5.7 10*3/uL (ref 1.7–7.7)
Neutrophils Relative %: 67 %
Platelets: 318 10*3/uL (ref 150–400)
RBC: 2.82 MIL/uL — ABNORMAL LOW (ref 4.22–5.81)
RDW: 17.3 % — ABNORMAL HIGH (ref 11.5–15.5)
WBC: 8.5 10*3/uL (ref 4.0–10.5)
nRBC: 0 % (ref 0.0–0.2)

## 2020-12-25 LAB — GLUCOSE, CAPILLARY
Glucose-Capillary: 134 mg/dL — ABNORMAL HIGH (ref 70–99)
Glucose-Capillary: 128 mg/dL — ABNORMAL HIGH (ref 70–99)
Glucose-Capillary: 138 mg/dL — ABNORMAL HIGH (ref 70–99)
Glucose-Capillary: 121 mg/dL — ABNORMAL HIGH (ref 70–99)
Glucose-Capillary: 91 mg/dL (ref 70–99)
Glucose-Capillary: 145 mg/dL — ABNORMAL HIGH (ref 70–99)
Glucose-Capillary: 123 mg/dL — ABNORMAL HIGH (ref 70–99)
Glucose-Capillary: 138 mg/dL — ABNORMAL HIGH (ref 70–99)
Glucose-Capillary: 123 mg/dL — ABNORMAL HIGH (ref 70–99)
Glucose-Capillary: 119 mg/dL — ABNORMAL HIGH (ref 70–99)
Glucose-Capillary: 127 mg/dL — ABNORMAL HIGH (ref 70–99)

## 2020-12-25 LAB — MAGNESIUM: Magnesium: 2.5 mg/dL — ABNORMAL HIGH (ref 1.7–2.4)

## 2020-12-25 LAB — TRIGLYCERIDES: Triglycerides: 103 mg/dL (ref <150)

## 2020-12-25 LAB — PHOSPHORUS: Phosphorus: 3 mg/dL (ref 2.5–4.6)

## 2020-12-25 MED ORDER — VANCOMYCIN VARIABLE DOSE PER UNSTABLE RENAL FUNCTION (PHARMACIST DOSING): Status: DC

## 2020-12-25 MED ORDER — POTASSIUM CHLORIDE 20 MEQ PO PACK
40.0000 meq | PACK | Freq: Once | ORAL | Status: AC
  Administered 2020-12-25: 40 meq
  Filled 2020-12-25: qty 2

## 2020-12-25 MED ORDER — FREE WATER
200.0000 mL | Status: DC
  Administered 2020-12-25: 200 mL

## 2020-12-25 MED ORDER — FREE WATER
300.0000 mL | Status: DC
  Administered 2020-12-25 – 2021-01-01 (×81): 300 mL

## 2020-12-25 NOTE — TOC Progression Note (Signed)
Transition of Care [facility]) - Progression Note    Patient Details  Name: Ryan Day MRN: [id number] Date of Birth: 1950-11-22  Transition of Care [facility] CM/SW [place], [place] Phone Number: 12/25/2020, 2:17 PM  Clinical Narrative:     CSW spoke at length with patient' spouse Ryan Day [phone] about goals of care for the patient.  [name] stated she understands the physicians and staff would like for her to make a decisions but she stated she is not ready to make a decision yet.  [name] requested the physicians and staff stop asking her about the decisions and she will let them know when she is ready.  CSW updated psycians and staff of [name]'s request.  [name] told CSW stated she is considering all options and she is aware keeping the patient in his current state is prolonging his suffering but she is concerned about their children and how they would feel about her making that decision for the patient.  [name] is realistic about the patient's outcomes and understands what is happening medically. [name] their faith and how she is using that to help her with her decisions.  CSW stated I would speak with Mr. Kowalke in the next could of days and I gave Mr. Blankenburg my contact information.   Expected Discharge Plan: [place] Barriers to Discharge: Continued Medical Work up  Expected Discharge Plan and Services Expected Discharge Plan: [place] In-house Referral: Clinical Social Work   Post Acute Care Choice: Durable Medical Equipment Living arrangements for the past 2 months: Single Family Home                                       Social Determinants of Health (SDOH) Interventions    Readmission Risk Interventions No flowsheet data found.

## 2020-12-25 NOTE — Progress Notes (Addendum)
Date of Admission:  3/17/2022Marvin Day is a 70 y.o. 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     ID: Ryan Day is a 70 y.o. male  Active Problems:   Sepsis ([place])    Subjective:  sedated  Medications:  . ascorbic acid  500 mg Per Tube BID  . baclofen  10 mg Per Tube QID  . chlorhexidine gluconate (MEDLINE KIT)  15 mL Mouth Rinse BID  . Chlorhexidine Gluconate Cloth  6 each Topical Daily  . diclofenac Sodium  4 g Topical QID  . feeding supplement (PROSource TF)  45 mL Per Tube Daily  . ferrous sulfate  75 mg Per Tube Daily  . FLUoxetine  40 mg Per Tube Daily  . free water  300 mL Per Tube Q2H  . furosemide  20 mg Intravenous BID  . guaiFENesin  400 mg Per Tube Q6H  . insulin aspart  0-15 Units Subcutaneous Q4H  . ipratropium-albuterol  3 mL Nebulization TID  . mouth rinse  15 mL Mouth Rinse 10 times per day  . midodrine  5 mg Per Tube BID WC  . nutrition supplement (JUVEN)  1 packet Per Tube BID BM  . polycarbophil  625 mg Per Tube Daily  . rivaroxaban  10 mg Per Tube Daily  . saccharomyces boulardii  250 mg Per Tube BID  . simethicone  80 mg Per Tube QID  . traZODone  50 mg Per Tube QHS  . vancomycin variable dose per unstable renal function (pharmacist dosing)   Does not apply See admin instructions  . zinc sulfate  220 mg Per Tube Daily    Objective: Vital signs in last 24 hours: Temp:  [96 F  (35.6 C)-99.3 F (37.4 C)] 96 F (35.6 C) (04/04 0800) Pulse Rate:  [68-103] 69 (04/04 1200) Resp:  [11-30] 20 (04/04 1200) BP: (88-131)/(50-71) 88/50 (04/04 1200) SpO2:  [88 %-100 %] 95 % (04/04 1200) FiO2 (%):  [40 %] 40 % (04/04 1053) Weight:  [814 kg] 116 kg (04/04 0253)  PHYSICAL EXAM:  General:sedated Tracheostomy B/l air entry Heart: Regular rate and rhythm, no murmur, rub or gallop. Abdomen: Soft, peg Extremities: edema extremities Skin:sacral wound B/l heel eschars flexiseal foley Neurologic: quadriplegia  Lab Results Recent Labs    12/24/20 0416 12/25/20 0500  WBC 9.7 8.5  HGB 8.0* 8.2*  HCT 26.0* 26.6*  NA 148* 151*  K 3.2* 3.5  CL 108 112*  CO2 34* 33*  BUN 51* 60*  CREATININE 0.76 0.76  Microbiology: 12/07/2020 blood culture has methicillin-resistant staph aureus, Enterobacter cloacae and Enterococcus faecalis  12/13/2020 blood culture: No growth so far  12/08/2020 bronchoalveolar lavage more than 100,000 colonies of Pseudomonas aeruginosa Methicillin-resistant staph aureus 40,000 colonies Enterococcus faecalis 20,000.  12/07/2020 urine culture Enterobacter cloacae  Sacral wound has rare Pseudomonas aeruginosa rare methicillin-resistant staph  Studies/Results: DG Chest Port 1 View  Result Date: 12/24/2020 CLINICAL DATA:  70 year old male with respiratory failure, sepsis. EXAM: PORTABLE CHEST 1 VIEW COMPARISON:  Portable chest 12/17/2020 and earlier. FINDINGS: Portable AP semi upright view at 0737 hours. Stable tracheostomy, right PICC line. Larger lung volumes and regressed patchy perihilar opacity from last month. Stable cardiac size and mediastinal contours. No pneumothorax, pulmonary edema. No definite pleural effusion. No areas of worsening ventilation. Negative visible bowel gas. No acute osseous abnormality identified. IMPRESSION: 1. Improved lung volumes with regressed but not resolved perihilar opacity since 12/17/2020. Favor resolving  infection. 2. No new cardiopulmonary abnormality. Electronically Signed   By: Genevie Ann M.D.   On: 12/24/2020 08:10     Assessment/Plan: Polymicrobial bacteremia with sepsis. MRSA, E faecalis, Enterobacter cloacae in blood culture. Pseudomonas in in BAL culture.  Has bilateral pulmonary infiltrates- treated with 2 antipseudomonal antibiotics Urine culture had Enterobacter.  He has Foley catheter Continue IV vanco and IV cefepime . Duration will depend on his progress- may need upto 4 weeks   Acute on chronic hypoxic respiratory failure secondary to frequent mucous plugging.  Bilateral pulmonary infiltrates being treated as HCAP.  Cipro Dc- continue cefepime  Sacral wound is looking good but colonized with multiple organisms Likely some of them are stool organisms May benefit from a diverting colostomy to help with wound healing  Bilateral heel pressure eschars  AKI has resolved  Anemia of chronic disease  Quadriplegia following a fall and fracture of C5 with cord compression  He has had decompressive surgery in August 2021 at [facility].  PEG in place  Neurogenic bladder with Foley catheter.  Discussed the management with his nurse Dr.Fitzgerald will follow him from tomorrow

## 2020-12-25 NOTE — Consult Note (Signed)
[place] for Electrolyte Monitoring and Replacement   Recent Labs: Potassium (mmol/L)  Date Value  12/25/2020 3.5   Magnesium (mg/dL)  Date Value  12/25/2020 2.5 (H)   Calcium (mg/dL)  Date Value  12/25/2020 10.1   Albumin (g/dL)  Date Value  12/07/2020 3.1 (L)  02/01/2020 4.4   Phosphorus (mg/dL)  Date Value  12/25/2020 3.0   Sodium (mmol/L)  Date Value  12/25/2020 151 (H)  02/01/2020 143   Corrected Ca: 10.8  Assessment: Patient is a 70 y/o M presented with sepsis. Pt presented with complaints of recurrent UTI and hematuria. 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 Pt has chronic tracheostomy and PEG tube. PMH includes HTN and HLD. Pharmacy has been consulted to assist with electrolyte monitoring and replacement as indicated.   Scr: 0.51 > 0.56 > 0.55 > 0.49 > 0.64>0.76 (baseline ~0.5)  Nutrition: tube feeds @ 70 mL/hr, prosource tube feeds once daily   MIVF: Free water at 300 mL Q2h (3600 mL/day)   Diuretics: Lasix 20 mg BID started 4/1   Goal of Therapy:  Electrolytes within normal limits  Plan:  -- Na 146>151 (trending up): Free water increased by MD from 200 > 300 mL per tube q4h > q2h (3600 mL/day)      -- K 3.5 on lasix: NP ordered 40 mEq KCl per tube x 1 dose  -- Follow-up electrolytes with AM labs  Benn Moulder, PharmD Pharmacy Resident  12/25/2020 7:22 AM

## 2020-12-25 NOTE — Progress Notes (Signed)
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 According to pts family last week pt noted to have sediment in his urine at home concerning for UTI. He had a foley catheter placed 2 weeks ago and completed a 7 day course of abx therapy due to UTI. Due to concerns of recurrent UTI pts family contacted pts PCP office last week, and heard from his PCP this week and were awaiting urine culture results. However, pt noted to have hematuria yesterday and today along with sinus tachycardia hr 120's. Pts daughter is a Marine scientist and due to concern of sepsis pt transported to the ER via EMS for further evaluation and treatment.    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 C5 Cervical Fracture (following a mechanical fall at home) s/p C3-6 Laminectomy and Posterior Instrumentation and Fusion 08/4  Quadriplegia  Spinal Cord Compression  Chronic Tracheostomy and PEG   Significant Hospital Events: Including procedures, antibiotic start and stop dates in addition to other pertinent events    Significant Hospital Events: Including procedures, antibiotic start and stop dates in addition to other pertinent events   03/17: Pt admitted to ICU with urosepsis requiring vasopressors and acute on chronic hypoxic respiratory failure secondary to mucous plugging requiring exchange of cuffless trach to size 6 mm cuffed trach to be placed on mechanical ventilation  03/17: Right femoral central lineplaced by ER physician  03/17: CT Abd Pelvis findings with cystitis. Correlation with urinalysis is Recommended. Mild to moderate severity bibasilar atelectasis and/or Infiltrate. Sigmoid diverticulosis. Sacral decubitus ulcer, as described above. MRI correlation is recommended, as sequelae  associated with acute osteomyelitis cannot be excluded. Aortic atherosclerosis. 03/17: CT Head revealed mild chronic ischemic white matter disease. No acute intracranial abnormality seen 03/17: Vancomycin x1 dose and Cefepime x1 dose 03/17: Zosyn>>completed 03/27: On Cefepime 3/30severe bradycardia with decrease in PS mode 3/31 patient with increased WOB placed on full vent support 4/1 remains on vent,+pneumonia, failure to wean from vent 4/2 severe resp distress, on full vent support 4/4 patient failed another SBT he became bradycardic unresponsive still not able to communicate with providers wife is at the bedside and 2 daughters are still deciding about how to proceed with care   Micro 12/07/2020 blood culture has methicillin-resistant staph aureus, Enterobacter cloacae and Enterococcus faecalis  12/13/2020 blood culture: No growth so far  12/08/2020  bronchoalveolar lavage more than 100,000 colonies of Pseudomonas aeruginosa Methicillin-resistant staph aureus 40,000 colonies Enterococcus faecalis 20,000.  12/07/2020 urine culture Enterobacter cloacae   Sacral wound has rare Pseudomonas aeruginosa rare methicillin-resistant staph aureus few Bacteroides and rare Candida albicans. Likely stool contamination  Anti-infectives (From admission, onward)   Start     Dose/Rate Route Frequency Ordered Stop   12/20/20 2000  vancomycin (VANCOREADY) IVPB 500 mg/100 mL        500 mg 100 mL/hr over 60 Minutes Intravenous Every 12 hours 12/20/20 1709     12/17/20 1400  vancomycin (VANCOREADY) IVPB 750 mg/150 mL  Status:  Discontinued        750 mg 150 mL/hr over 60 Minutes Intravenous Every 12 hours 12/17/20 1159 12/20/20 0604   12/13/20 1000  vancomycin (VANCOREADY) IVPB 1000 mg/200 mL  Status:  Discontinued        1,000 mg 200 mL/hr over 60 Minutes  Intravenous Every 12 hours 12/13/20 0832 12/17/20 1159   12/12/20 1800  ciprofloxacin (CIPRO) IVPB 400 mg  Status:  Discontinued         400 mg 200 mL/hr over 60 Minutes Intravenous Every 8 hours 12/12/20 1620 12/21/20 1529   12/12/20 1700  ciprofloxacin (CIPRO) IVPB 400 mg  Status:  Discontinued        400 mg 200 mL/hr over 60 Minutes Intravenous Every 12 hours 12/12/20 1609 12/12/20 1620   12/12/20 1615  vancomycin variable dose per unstable renal function (pharmacist dosing)  Status:  Discontinued         Does not apply See admin instructions 12/12/20 1615 12/13/20 1050   12/12/20 1100  vancomycin (VANCOREADY) IVPB 1750 mg/350 mL        1,750 mg 175 mL/hr over 120 Minutes Intravenous  Once 12/12/20 0957 12/12/20 1301   12/12/20 0600  ceFEPIme (MAXIPIME) 2 g in sodium chloride 0.9 % 100 mL IVPB        2 g 200 mL/hr over 30 Minutes Intravenous Every 8 hours 12/11/20 1929     12/11/20 1522  vancomycin variable dose per unstable renal function (pharmacist dosing)  Status:  Discontinued         Does not apply See admin instructions 12/11/20 1522 12/12/20 1607   12/11/20 0900  vancomycin (VANCOREADY) IVPB 1000 mg/200 mL  Status:  Discontinued        1,000 mg 200 mL/hr over 60 Minutes Intravenous Every 12 hours 12/11/20 0813 12/11/20 1522   12/10/20 1515  meropenem (MERREM) 1 g in sodium chloride 0.9 % 100 mL IVPB        1 g 200 mL/hr over 30 Minutes Intravenous Every 8 hours 12/10/20 1418 12/12/20 0834   12/09/20 1000  ceFEPIme (MAXIPIME) 2 g in sodium chloride 0.9 % 100 mL IVPB  Status:  Discontinued        2 g 200 mL/hr over 30 Minutes Intravenous Every 8 hours 12/09/20 0811 12/10/20 1418   12/09/20 0730  vancomycin (VANCOREADY) IVPB 2000 mg/400 mL        2,000 mg 200 mL/hr over 120 Minutes Intravenous  Once 12/09/20 0644 12/09/20 1616   12/09/20 0600  vancomycin (VANCOREADY) IVPB 1750 mg/350 mL  Status:  Discontinued        1,750 mg 175 mL/hr over 120 Minutes Intravenous Every 24 hours 12/08/20 0809 12/08/20 1229   12/08/20 2200  ceFEPIme (MAXIPIME) 2 g in sodium chloride 0.9 % 100 mL IVPB  Status:  Discontinued         2 g 200 mL/hr over 30 Minutes Intravenous Every 12 hours 12/08/20 0809 12/09/20 0811   12/08/20 1700  vancomycin (VANCOREADY) IVPB 1500 mg/300 mL  Status:  Discontinued        1,500 mg 150 mL/hr over 120 Minutes Intravenous Every 12 hours 12/08/20 0500 12/08/20 0809   12/08/20 1230  vancomycin variable dose per unstable renal function (pharmacist dosing)  Status:  Discontinued         Does not apply See admin instructions 12/08/20 1230 12/11/20 1359   12/08/20 0545  ceFEPIme (MAXIPIME) 2 g in sodium chloride 0.9 % 100 mL IVPB  Status:  Discontinued        2 g 200 mL/hr over 30 Minutes Intravenous Every 8 hours 12/08/20 0446 12/08/20 0809   12/08/20 0530  vancomycin (VANCOREADY) IVPB 2000 mg/400 mL       "Followed by" Linked Group Details   2,000 mg 200 mL/hr  over 120 Minutes Intravenous  Once 12/08/20 0441 12/08/20 0815   12/08/20 0530  vancomycin (VANCOREADY) IVPB 500 mg/100 mL  Status:  Discontinued       "Followed by" Linked Group Details   500 mg 100 mL/hr over 60 Minutes Intravenous  Once 12/08/20 0441 12/08/20 0803   12/07/20 2215  piperacillin-tazobactam (ZOSYN) IVPB 3.375 g  Status:  Discontinued        3.375 g 12.5 mL/hr over 240 Minutes Intravenous Every 8 hours 12/07/20 2124 12/08/20 0447   12/07/20 1800  ceFEPIme (MAXIPIME) 2 g in sodium chloride 0.9 % 100 mL IVPB        2 g 200 mL/hr over 30 Minutes Intravenous  Once 12/07/20 1757 12/07/20 2036   12/07/20 1800  vancomycin (VANCOCIN) IVPB 1000 mg/200 mL premix        1,000 mg 200 mL/hr over 60 Minutes Intravenous  Once 12/07/20 1757 12/07/20 1942         Interim History / Subjective:  Severe resp failure Remains critically ill Failure to wean from vent Remains on full vent support      Objective   Blood pressure 98/60, pulse 69, temperature (!) 96 F (35.6 C), temperature source Oral, resp. rate 18, height 6' 5.01" (1.956 m), weight 116 kg, SpO2 93 %.    Vent Mode: PRVC FiO2 (%):  [40 %] 40 % Set Rate:   [16 bmp] 16 bmp Vt Set:  [500 mL] 500 mL PEEP:  [5 cmH20] 5 cmH20 Pressure Support:  [15 cmH20] 15 cmH20   Intake/Output Summary (Last 24 hours) at 12/25/2020 1103 Last data filed at 12/25/2020 0930 Gross per 24 hour  Intake 2377.43 ml  Output 5050 ml  Net -2672.57 ml   Filed Weights   12/21/20 0500 12/23/20 0500 12/25/20 0253  Weight: 130 kg 123.4 kg 116 kg      REVIEW OF SYSTEMS  PATIENT IS UNABLE TO PROVIDE COMPLETE REVIEW OF SYSTEMS DUE TO SEVERE CRITICAL ILLNESS AND TOXIC METABOLIC ENCEPHALOPATHY   PHYSICAL EXAMINATION:  GENERAL:critically ill appearing, +resp distress HEAD: Normocephalic, atraumatic.  EYES: Pupils equal, round, reactive to light.  No scleral icterus.  MOUTH: Moist mucosal membrane. NECK: Supple. No thyromegaly. No nodules. No JVD.  PULMONARY: +rhonchi, +wheezing CARDIOVASCULAR: S1 and S2. Regular rate and rhythm. No murmurs, rubs, or gallops.  GASTROINTESTINAL: Soft, nontender, -distended. Positive bowel sounds.  MUSCULOSKELETAL: No swelling, clubbing, or edema.  NEUROLOGIC: obtunded SKIN:intact,warm,dry   Labs/imaging that I havepersonally reviewed  (right click and "Reselect all SmartList Selections" daily)    Resolved Hospital Problem list   NA   ASSESSMENT AND PLAN SYNOPSIS   70 yo quadriplegic with multiple mecial issues with acute hypoxic resp failure due to severe polymicrobial sepsis and septic shockfrom pseudomonal infections of lungs and sacral decub  Failure to wean from vent   Severe ACUTE Hypoxic and Hypercapnic Respiratory Failure -continue Mechanical Ventilator support -continue Bronchodilator Therapy -Wean Fio2 and PEEP as tolerated -VAP/VENT bundle implementation -Patient is not able to tolerate SAT SBT now and continue supportive care continuous discussions with the family and consideration of LTAC to consider long weaning    CARDIAC FAILURE-acute diastolic dysfunction -oxygen as needed -Lasix as  tolerated -follow up cardiac enzymes as indicated   CARDIAC ICU monitoring   ACUTE KIDNEY INJURY/Renal Failure -continue Foley Catheter-assess need -Avoid nephrotoxic agents -Follow urine output, BMP -Ensure adequate renal perfusion, optimize oxygenation -Renal dose medications  Intake/Output Summary (Last 24 hours) at 12/25/2020 1103 Last data filed at  12/25/2020 0930 Gross per 24 hour  Intake 2377.43 ml  Output 5050 ml  Net -2672.57 ml     NEUROLOGY Acute toxic metabolic encephalopathy Sedation as needed   SEPTIC SHOCK -use vasopressors to keep MAP>65 as needed   INFECTIOUS DISEASE -continue antibiotics as prescribed -follow up cultures -follow up ID consultation  ENDO - ICU hypoglycemic\Hyperglycemia protocol -check FSBS per protocol   GI GI PROPHYLAXIS as indicated  NUTRITIONAL STATUS DIET-->TF's as tolerated Constipation protocol as indicated   ELECTROLYTES -follow labs as needed -replace as needed -pharmacy consultation and following      Best practice (evaluated daily)  Diet: NPO is on tube feed Pain/Anxiety/Delirium protocol (if indicated): Yes (RASS goal 0) VAP protocol (if indicated): Yes DVT prophylaxis: Systemic AC GI prophylaxis: H2B Glucose control: SSI No Central venous access: Yes, and it is still needed PICC line right upper extremity Arterial line: N/A Foley: Yes, and it is still needed Mobility: bed rest PT consulted: N/A Last date of multidisciplinary goals of care discussion[N/A] Code Status: full code Disposition:ICU     Labs   CBC: Recent Labs  Lab 12/21/20 0441 12/22/20 0433 12/23/20 0421 12/24/20 0416 12/25/20 0500  WBC 6.8 7.3 7.7 9.7 8.5  NEUTROABS  --   --   --   --  5.7  HGB 7.7* 7.5* 7.8* 8.0* 8.2*  HCT 24.0* 23.8* 25.2* 26.0* 26.6*  MCV 90.6 91.5 91.6 93.5 94.3  PLT 268 272 331 316 621    Basic Metabolic Panel: Recent Labs  Lab 12/21/20 0441 12/22/20 0433 12/23/20 0421  12/24/20 0416 12/25/20 0500  NA 143 145 146* 148* 151*  K 3.8 3.5 3.8 3.2* 3.5  CL 108 110 108 108 112*  CO2 _0 34* 33*  GLUCOSE 133* 145* 124* 149* 159*  BUN 33* 37* 45* 51* 60*  CREATININE 0.49* 0.64 0.63 0.76 0.76  CALCIUM 9.5 9.6 9.7 10.0 10.1  MG 2.3 2.5* 2.2 2.5* 2.5*  PHOS 2.4* 2.9 2.8 3.4 3.0   GFR: Estimated Creatinine Clearance: 124.9 mL/min (by C-G formula based on SCr of 0.76 mg/dL). Recent Labs  Lab 12/22/20 0433 12/23/20 0421 12/24/20 0416 12/25/20 0500  WBC 7.3 7.7 9.7 8.5    Liver Function Tests: No results for input(s): AST, ALT, ALKPHOS, BILITOT, PROT, ALBUMIN in the last 168 hours. No results for input(s): LIPASE, AMYLASE in the last 168 hours. No results for input(s): AMMONIA in the last 168 hours.  ABG    Component Value Date/Time   PHART 7.38 12/08/2020 0500   PCO2ART 42 12/08/2020 0500   PO2ART 115 (H) 12/08/2020 0500   HCO3 24.8 12/08/2020 0500   ACIDBASEDEF 0.4 12/08/2020 0500   O2SAT 98.4 12/08/2020 0500     Coagulation Profile: No results for input(s): INR, PROTIME in the last 168 hours.  Cardiac Enzymes: No results for input(s): CKTOTAL, CKMB, CKMBINDEX, TROPONINI in the last 168 hours.  HbA1C: Hgb A1c MFr Bld  Date/Time Value Ref Range Status  06/09/2020 07:00 AM 5.8 (H) 4.8 - 5.6 % Final    Comment:    (NOTE) Pre diabetes:          5.7%-6.4%  Diabetes:              >6.4%  Glycemic control for   <7.0% adults with diabetes     CBG: Recent Labs  Lab 12/23/20 1136 12/24/20 0332 12/24/20 0816 12/24/20 1948 12/24/20 2329  GLUCAP 116* 142* 121* 150* 117*    Allergies Allergies  Allergen  Reactions  . Shrimp (Diagnostic)     Experiences facial droop when eating seafood       DVT/GI PRX  assessed I Assessed the need for Labs I Assessed the need for Foley I Assessed the need for Central Venous Line Family Discussion when available I Assessed the need for Mobilization I made an Assessment of medications to  be adjusted accordingly Safety Risk assessment completed  CASE DISCUSSED IN MULTIDISCIPLINARY ROUNDS WITH ICU TEAM       Overall, patient is critically ill, prognosis is guarded.  Patient with Multiorgan failure and at high risk for cardiac arrest and death.   Very poor chance of meaningful recovery Consideration of LTAC.

## 2020-12-25 NOTE — Progress Notes (Signed)
Palliative: [name] is lying quietly in bed.  He is trached/ventilated.  He does not interact with me in any meaningful way.  There is no family at bedside at this time.  He failed weaning trial this morning.  Call to wife, Ryan Day.  We briefly talk about Ryan Day health condition and palliative medicine consult.  No questions at this time.  Conference with attending, bedside nursing staff, transition of care team related to patient condition, needs, goals of care. PMT to follow.  Plan:  At this point full scope/full code.  Time for outcomes.  PMT to follow. Prognosis: Guarded at this point.  In hospital death would not be surprising.  15 minutes Ryan Axe, NP Palliative medicine team Team time [phone] Greater than 50% of this time was spent counseling and coordinating care related to the above assessment and plan.

## 2020-12-25 NOTE — Progress Notes (Signed)
Pharmacy Antibiotic Note  Ryan Day is a 70 y.o. male admitted on 12/07/2020 with sepsis. Pt presented with complaints of recurrent UTI and hematuria with foley cath last placed ~2 weeks ago, exchanged in ED. 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 Pt has PEG and trache. Pt admitted 11/22-12/5 for HCAP and sacral ulcer with osteomyelitis. Hx of enterobacter cloacae, pseudomonas in ucx. Pt has received vanc, Unasyn, Zosyn, and cefepime on recent previous admissions. ID consulted. Pt with polymicrobial bacteremia with sepsis resulting from multiple sources including UTI, PNA, and sacral wound. Meropenem was discontinued 3/22 based on sensitivity to ceftazidime from BAL. IV Cipro was added due to increased secretions, now d/c'd on 3/31 after 11 days of therapy completed. Pharmacy has been consulted for vancomycin dosing.   Scr had improved from AKI on admission, however trending up again 0.49>0.64>0.76 (baseline Scr ~ 0.5)  Day 19 abx   Plan: -- Continue Cefepime 2 grams q8h   -- Dose vanc per variable renal function due to increasing Scr. Random level ordered for 4/5 AM. Pt t 1/2 ~27 hours based on levels from 4/1. -- Daily Scr per protocol  Height: 6' 5.01" (195.6 cm) Weight: 116 kg (255 lb 11.7 oz) IBW/kg (Calculated) : 89.12   Temp (24hrs), Avg:97.8 F (36.6 C), Min:96.2 F (35.7 C), Max:99.3 F (37.4 C)  Recent Labs  Lab 12/20/20 0500 12/20/20 1330 12/21/20 0441 12/22/20 0433 12/22/20 1147 12/22/20 2009 12/23/20 0421 12/24/20 0416 12/25/20 0500  WBC  --   --  6.8 7.3  --   --  7.7 9.7 8.5  CREATININE  --   --  0.49* 0.64  --   --  0.63 0.76 0.76  VANCOTROUGH  --  21*  --   --   --  17  --   --   --   VANCOPEAK 50*  --   --   --  21*  --   --   --   --     Estimated Creatinine Clearance: 124.9 mL/min (by C-G formula based on SCr of 0.76 mg/dL).    Allergies  Allergen Reactions  . Shrimp (Diagnostic)     Experiences facial droop when  eating seafood    Antimicrobials this admission: 3/17 Vancomycin  >>  3/17 Cefepime  >> 3/20; 3/22 >> 3/17 Zosyn x 1  3/20 Meropenem >> 3/21  3/22 Cipro >> 3/31  Dose adjustments this admission: 3/27 Vanc 1000 mg q12h to 750 mg q12h 3/30 Vanc 750 mg q12h to 500 mg q12h 4/4 vanc per variable renal function   Microbiology results: 3/17 MRSA PCR: positive 3/17 BCx: E cloacae, MRSA, E faecalis 3/17 UCx: E cloacae 3/18 Resp cx: PSA 3/18 BAL: PSA (ceftazidime sensitive), MRSA, E faecalis 3/21 Sacral cx: MRSA, PSA 3/23 BCx: NGTD 3/27 Respiratory Cx: PSA (gentamicin sensitive) - MDR PSA likely colonization - treating based on BAL susc.    Thank you for allowing pharmacy to be a part of this patient's care.  Benn Moulder, PharmD Pharmacy Resident  12/25/2020 1:45 PM

## 2020-12-26 DIAGNOSIS — I5033 Acute on chronic diastolic (congestive) heart failure: Secondary | ICD-10-CM

## 2020-12-26 DIAGNOSIS — I471 Supraventricular tachycardia: Secondary | ICD-10-CM

## 2020-12-26 LAB — BLOOD GAS, ARTERIAL
Acid-Base Excess: 10.8 mmol/L — ABNORMAL HIGH (ref 0.0–2.0)
Bicarbonate: 36.1 mmol/L — ABNORMAL HIGH (ref 20.0–28.0)
FIO2: 0.4
MECHVT: 500 mL
O2 Saturation: 96.6 %
PEEP: 5 cmH2O
Patient temperature: 37
RATE: 16 resp/min
pCO2 arterial: 52 mmHg — ABNORMAL HIGH (ref 32.0–48.0)
pH, Arterial: 7.45 (ref 7.350–7.450)
pO2, Arterial: 83 mmHg (ref 83.0–108.0)

## 2020-12-26 LAB — COMPREHENSIVE METABOLIC PANEL
ALT: 23 U/L (ref 0–44)
AST: 23 U/L (ref 15–41)
Albumin: 2.6 g/dL — ABNORMAL LOW (ref 3.5–5.0)
Alkaline Phosphatase: 126 U/L (ref 38–126)
Anion gap: 7 (ref 5–15)
BUN: 55 mg/dL — ABNORMAL HIGH (ref 8–23)
CO2: 33 mmol/L — ABNORMAL HIGH (ref 22–32)
Calcium: 10.2 mg/dL (ref 8.9–10.3)
Chloride: 110 mmol/L (ref 98–111)
Creatinine, Ser: 0.77 mg/dL (ref 0.61–1.24)
GFR, Estimated: >60 mL/min (ref >60)
Glucose, Bld: 138 mg/dL — ABNORMAL HIGH (ref 70–99)
Potassium: 3.6 mmol/L (ref 3.5–5.1)
Sodium: 150 mmol/L — ABNORMAL HIGH (ref 135–145)
Total Bilirubin: 0.7 mg/dL (ref 0.3–1.2)
Total Protein: 7.4 g/dL (ref 6.5–8.1)

## 2020-12-26 LAB — CBC WITH DIFFERENTIAL/PLATELET
Abs Immature Granulocytes: 0.02 10*3/uL (ref 0.00–0.07)
Basophils Absolute: 0 10*3/uL (ref 0.0–0.1)
Basophils Relative: 0 %
Eosinophils Absolute: 0.4 10*3/uL (ref 0.0–0.5)
Eosinophils Relative: 6 %
HCT: 25.8 % — ABNORMAL LOW (ref 39.0–52.0)
Hemoglobin: 8.2 g/dL — ABNORMAL LOW (ref 13.0–17.0)
Immature Granulocytes: 0 %
Lymphocytes Relative: 15 %
Lymphs Abs: 1.1 10*3/uL (ref 0.7–4.0)
MCH: 29.6 pg (ref 26.0–34.0)
MCHC: 31.8 g/dL (ref 30.0–36.0)
MCV: 93.1 fL (ref 80.0–100.0)
Monocytes Absolute: 1.1 10*3/uL — ABNORMAL HIGH (ref 0.1–1.0)
Monocytes Relative: 16 %
Neutro Abs: 4.4 10*3/uL (ref 1.7–7.7)
Neutrophils Relative %: 63 %
Platelets: 288 10*3/uL (ref 150–400)
RBC: 2.77 MIL/uL — ABNORMAL LOW (ref 4.22–5.81)
RDW: 17 % — ABNORMAL HIGH (ref 11.5–15.5)
WBC: 7 10*3/uL (ref 4.0–10.5)
nRBC: 0 % (ref 0.0–0.2)

## 2020-12-26 LAB — GLUCOSE, CAPILLARY
Glucose-Capillary: 112 mg/dL — ABNORMAL HIGH (ref 70–99)
Glucose-Capillary: 118 mg/dL — ABNORMAL HIGH (ref 70–99)
Glucose-Capillary: 120 mg/dL — ABNORMAL HIGH (ref 70–99)
Glucose-Capillary: 129 mg/dL — ABNORMAL HIGH (ref 70–99)
Glucose-Capillary: 132 mg/dL — ABNORMAL HIGH (ref 70–99)
Glucose-Capillary: 163 mg/dL — ABNORMAL HIGH (ref 70–99)

## 2020-12-26 LAB — MAGNESIUM: Magnesium: 2.5 mg/dL — ABNORMAL HIGH (ref 1.7–2.4)

## 2020-12-26 LAB — VANCOMYCIN, RANDOM: Vancomycin Rm: 13

## 2020-12-26 LAB — PHOSPHORUS: Phosphorus: 2.9 mg/dL (ref 2.5–4.6)

## 2020-12-26 MED ORDER — VANCOMYCIN HCL 750 MG/150ML IV SOLN
750.0000 mg | INTRAVENOUS | Status: DC
  Filled 2020-12-26: qty 150

## 2020-12-26 MED ORDER — VANCOMYCIN HCL 750 MG IV SOLR
750.0000 mg | INTRAVENOUS | Status: DC
  Filled 2020-12-26: qty 750

## 2020-12-26 MED ORDER — POTASSIUM CHLORIDE 20 MEQ PO PACK
40.0000 meq | PACK | Freq: Once | ORAL | Status: AC
  Administered 2020-12-26: 40 meq
  Filled 2020-12-26: qty 2

## 2020-12-26 MED ORDER — ACETAZOLAMIDE SODIUM 500 MG IJ SOLR
500.0000 mg | Freq: Once | INTRAMUSCULAR | Status: AC
  Administered 2020-12-26: 500 mg via INTRAVENOUS
  Filled 2020-12-26: qty 500

## 2020-12-26 MED ORDER — VANCOMYCIN HCL 1000 MG/200ML IV SOLN
1000.0000 mg | INTRAVENOUS | Status: DC
  Administered 2020-12-26 – 2020-12-31 (×6): 1000 mg via INTRAVENOUS
  Administered 2021-01-01: 750 mg via INTRAVENOUS
  Administered 2021-01-02 – 2021-01-04 (×3): 1000 mg via INTRAVENOUS
  Filled 2020-12-26 (×13): qty 200

## 2020-12-26 MED ORDER — ACETAZOLAMIDE 250 MG PO TABS
250.0000 mg | ORAL_TABLET | Freq: Once | ORAL | Status: AC
  Administered 2020-12-26: 250 mg
  Filled 2020-12-26: qty 1

## 2020-12-26 MED ORDER — PIVOT 1.5 CAL PO LIQD
1000.0000 mL | ORAL | Status: DC
  Administered 2020-12-26 – 2021-01-03 (×11): 1000 mL
  Filled 2020-12-26: qty 1000

## 2020-12-26 NOTE — Progress Notes (Signed)
Palliative:   Mr. Willet is lying quietly in bed trached/ventilated and lightly sedated.  He will make an somewhat keep eye contact.  He is able to answer simple yes and no questions and mouth words.  He can somewhat make his needs known.  Present today at bedside his wife, [name] and bedside nursing staff attending to needs.  Mrs. Stcyr shares that family is coming to visit Mr. Schwinn.  We briefly talked about his physical needs, weaning trial, and sedation.  [employer] states, "it is about him".  She shares that their goal is to respect Mr. Vallecillo" he does and does not want.  She tells me, "we are waiting on him".  Support provided.  [name] denies questions at this time.  Conference with attending and bedside nursing staff related to patient condition, needs, goals of care. PMT to shadow.  Plan:    At this point, continue full scope/full code.  Time for outcomes.  25 minutes  Quinn Axe, NP Palliative medicine team Team phone [phone] Greater than 50% of this time was spent counseling and coordinating care related to the above assessment and plan.

## 2020-12-26 NOTE — Progress Notes (Addendum)
Pharmacy Antibiotic Note  Ryan Day is a 70 y.o. male admitted on 12/07/2020 with sepsis. Pt presented with complaints of recurrent UTI and hematuria with foley cath last placed ~2 weeks ago, exchanged in ED. 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 Pt has PEG and trache. Pt admitted 11/22-12/5 for HCAP and sacral ulcer with osteomyelitis. Hx of enterobacter cloacae, pseudomonas in ucx. Pt has received vanc, Unasyn, Zosyn, and cefepime on recent previous admissions. ID consulted. Pt with polymicrobial bacteremia with sepsis resulting from multiple sources including UTI, PNA, and sacral wound. Meropenem was discontinued 3/22 based on sensitivity to ceftazidime from BAL. IV Cipro was added due to increased secretions, now d/c'd on 3/31 following 11 days of therapy. Pharmacy was consulted for vancomycin dosing. His creatinine has improved since admission but is trending up slightly. Following calculations of the previous levels on 4/1 a random level was drawn this morning to confirm T1/2, Ke. The level confirms that, while AUC is at goal,  the T1/2 is greater than the dosing interval.   Plan:  1) continue cefepime 2 grams IV every 8 hours    2) adjust vancomycin dose to 1000 mg IV every 24 hours  Ke: 0.026 h-1, T1/2: 26.9 h  Css (calculated): 25.7 / 14.2 mcg/mL Goal AUC 400-550 Expected AUC: 466.3 Daily SCr while on IV vancomycin to assess renal function  Height: 6' 5.01" (195.6 cm) Weight: 119.3 kg (263 lb 0.1 oz) IBW/kg (Calculated) : 89.12   Temp (24hrs), Avg:97.5 F (36.4 C), Min:96.1 F (35.6 C), Max:99.4 F (37.4 C)  Recent Labs  Lab 12/20/20 0500 12/20/20 1330 12/21/20 0441 12/22/20 0433 12/22/20 1147 12/22/20 2009 12/23/20 0421 12/24/20 0416 12/25/20 0500 12/26/20 0454  WBC  --   --    < > 7.3  --   --  7.7 9.7 8.5 7.0  CREATININE  --   --    < > 0.64  --   --  0.63 0.76 0.76 0.77  VANCOTROUGH  --  21*  --   --   --  17  --   --   --    --   VANCOPEAK 50*  --   --   --  21*  --   --   --   --   --   VANCORANDOM  --   --   --   --   --   --   --   --   --  13   < > = values in this interval not displayed.    Estimated Creatinine Clearance: 126.5 mL/min (by C-G formula based on SCr of 0.77 mg/dL).    Allergies  Allergen Reactions  . Shrimp (Diagnostic)     Experiences facial droop when eating seafood    Antimicrobials this admission: 3/20 Meropenem >> 3/21  3/22 Cipro >> 3/31 3/17 Vancomycin  >>  3/17 Cefepime  >> 3/20  3/22 >>  Microbiology results: 3/17 MRSA PCR: positive 3/17 BCx: E cloacae, MRSA, E faecalis 3/17 UCx: E cloacae 3/18 Resp cx: PSA 3/18 BAL: PSA (ceftazidime sensitive), MRSA, E faecalis 3/21 Sacral cx: MRSA, PSA 3/23 BCx: NGTD 3/27 Respiratory Cx: PSA (gentamicin sensitive) - MDR PSA likely colonization - treating based on BAL susc.    Thank you for allowing pharmacy to be a part of this patient's care.  Vallery Sa, PharmD 12/26/2020 9:47 AM

## 2020-12-26 NOTE — Progress Notes (Signed)
Nutrition Follow Up Note   DOCUMENTATION CODES:   Not applicable  INTERVENTION:   Increase Pivot 1.5 to goal rate of 74m/hr   Free water flushes 3067mq2 hrs per MD  Regimen provides 2700kcal/day, 169g/day protein and [id number] free water   Juven Fruit Punch BID via tube, each serving provides 95kcal and 2.5g of protein (amino acids glutamine and arginine)  NUTRITION DIAGNOSIS:   Inadequate oral intake related to dysphagia as evidenced by NPO status (pt with chronic PEG).  GOAL:   Provide needs based on ASPEN/SCCM guidelines  - met with tube feeds   MONITOR:   Vent status,Labs,Weight trends,Skin,I & O's  ASSESSMENT:   [age]o male with h/o HTN, quadriplegia secondary to fall and chronic tracheostomy and PEG tube who is admitted with UTI, sepsis and mucous plugging   Pt remains ventilated via existing trach. PEG tube in place. Pt tolerating tube feeds at goal rate; will adjust tube feeds slightly to provide more calories. Per chart, pt up ~12lbs since admit. Palliative care following.   Enteral Access: 20 Fr. PEG-tube placed 05/04/20  Medications reviewed and include: vitamin C, ferrous sulfate, insulin, florastor, simethicone, zinc, cefepime, pepcid, propofol   Labs reviewed: Na 150(H), K 3.6 wnl, BUN 55(H), P 2.9 wnl, Mg 2.5(H) Hgb 8.2(L), Hct 25.8(L) cbgs- 120, 112 x 24 hrs  Patient is currently intubated on ventilator support MV: 8.9 L/min Temp (24hrs), Avg:97.5 F (36.4 C), Min:96.1 F (35.6 C), Max:99.4 F (37.4 C)  MAP- >78m39m Propofol- 3.9ml/73m provides 103kcal/day   UOP- 4100ml 69met Order:    Diet Order            Diet NPO time specified  Diet effective now                EDUCATION NEEDS:   No education needs have been identified at this time  Skin:  Skin Assessment: Reviewed RN Assessment (Right gluteal, extends to hip: 1 cm x 1 cm x 0.2 cm, Sacrum: 2.5 cm x 2 cm x 1.5 cm, Right heel:  2.7 cm x 2.4 cm x 0.1 cm, Left heel: 4.5 cm x  5.6 cm x 0.1 cm)  Last BM:  4/4- type 7  Height:   Ht Readings from Last 1 Encounters:  12/26/20 6' 5.01" (1.956 m)    Weight:   Wt Readings from Last 1 Encounters:  12/26/20 119.3 kg    Ideal Body Weight:  94.5 kg  BMI:  Body mass index is 31.18 kg/m.  Estimated Nutritional Needs:   Kcal:  2700-3000kcal/day  Protein:  140-165 grams  Fluid:  2.6-2.9L/day  Syrenity Klepacki Koleen DistanceD, LDN Please refer to [facility] and/or RD on-call/weekend/after hours pager

## 2020-12-26 NOTE — Progress Notes (Signed)
Progress Note  Patient Name: Ryan Day Date of Encounter: 12/26/2020  [facility] Cardiologist: Lynden Ang  Subjective   Patient mechanically ventilated via tracheostomy but awakens despite being sedated.  He indicates that he is comfortable.  Inpatient Medications    Scheduled Meds: . ascorbic acid  500 mg Per Tube BID  . baclofen  10 mg Per Tube QID  . chlorhexidine gluconate (MEDLINE KIT)  15 mL Mouth Rinse BID  . Chlorhexidine Gluconate Cloth  6 each Topical Daily  . diclofenac Sodium  4 g Topical QID  . feeding supplement (PROSource TF)  45 mL Per Tube Daily  . ferrous sulfate  75 mg Per Tube Daily  . FLUoxetine  40 mg Per Tube Daily  . free water  300 mL Per Tube Q2H  . guaiFENesin  400 mg Per Tube Q6H  . insulin aspart  0-15 Units Subcutaneous Q4H  . ipratropium-albuterol  3 mL Nebulization TID  . mouth rinse  15 mL Mouth Rinse 10 times per day  . midodrine  5 mg Per Tube BID WC  . nutrition supplement (JUVEN)  1 packet Per Tube BID BM  . polycarbophil  625 mg Per Tube Daily  . rivaroxaban  10 mg Per Tube Daily  . saccharomyces boulardii  250 mg Per Tube BID  . simethicone  80 mg Per Tube QID  . traZODone  50 mg Per Tube QHS  . vancomycin variable dose per unstable renal function (pharmacist dosing)   Does not apply See admin instructions  . zinc sulfate  220 mg Per Tube Daily   Continuous Infusions: . sodium chloride 5 mL/hr at 12/25/20 0659  . ceFEPime (MAXIPIME) IV 2 g (12/26/20 0520)  . famotidine (PEPCID) IV 20 mg (12/25/20 2143)  . feeding supplement (PIVOT 1.5 CAL) 1,000 mL (12/25/20 2156)  . propofol (DIPRIVAN) infusion 5 mcg/kg/min (12/26/20 0524)   PRN Meds: sodium chloride, acetaminophen, bisacodyl, docusate sodium, fentaNYL (SUBLIMAZE) injection, glycopyrrolate, midazolam, ondansetron (ZOFRAN) IV, polyethylene glycol, sodium chloride flush   Vital Signs    Vitals:   12/26/20 0500 12/26/20 0550 12/26/20 0600 12/26/20 0721  BP: (!) 160/78   113/63   Pulse: 100  83   Resp: 17  (!) 26   Temp: 98.4 F (36.9 C)     TempSrc: Oral     SpO2: 97% 93% 94% 95%  Weight: 119.3 kg     Height:        Intake/Output Summary (Last 24 hours) at 12/26/2020 0842 Last data filed at 12/26/2020 0600 Gross per 24 hour  Intake 4038.54 ml  Output 4220 ml  Net -181.46 ml   Last 3 Weights 12/26/2020 12/25/2020 12/23/2020  Weight (lbs) 263 lb 0.1 oz 255 lb 11.7 oz 272 lb 0.8 oz  Weight (kg) 119.3 kg 116 kg 123.4 kg      Telemetry    Normal sinus rhythm with PACs and PVCs.  Single atrial run lasting 7.5 seconds noted.  No significant bradycardia. - Personally Reviewed  ECG    No new tracing.  Physical Exam   GEN: No acute distress.  Trached and mechanically ventilated. Neck:  Tracheostomy in place.  Unable to assess JVP due to support devices. Cardiac: RRR, no murmurs, rubs, or gallops.  Respiratory:  Coarse breath sounds anteriorly. GI:  Firm abdomen. MS:  Trace pretibial edema bilaterally. Neuro:   Somnolent but awakens to voice.  Able to follow commands. Psych: Unable to assess due to mechanical ventilation.  Labs  High Sensitivity Troponin:   Recent Labs  Lab 12/17/20 1445  TROPONINIHS 5      Chemistry Recent Labs  Lab 12/24/20 0416 12/25/20 0500 12/26/20 0454  NA 148* 151* 150*  K 3.2* 3.5 3.6  CL 108 112* 110  CO2 34* 33* 33*  GLUCOSE 149* 159* 138*  BUN 51* 60* 55*  CREATININE 0.76 0.76 0.77  CALCIUM 10.0 10.1 10.2  PROT  --   --  7.4  ALBUMIN  --   --  2.6*  AST  --   --  23  ALT  --   --  23  ALKPHOS  --   --  126  BILITOT  --   --  0.7  GFRNONAA >60 >60 >60  ANIONGAP '6 6 7     ' Hematology Recent Labs  Lab 12/24/20 0416 12/25/20 0500 12/26/20 0454  WBC 9.7 8.5 7.0  RBC 2.78* 2.82* 2.77*  HGB 8.0* 8.2* 8.2*  HCT 26.0* 26.6* 25.8*  MCV 93.5 94.3 93.1  MCH 28.8 29.1 29.6  MCHC 30.8 30.8 31.8  RDW 17.5* 17.3* 17.0*  PLT 316 318 288    BNPNo results for input(s): BNP, PROBNP in the last 168  hours.   DDimer No results for input(s): DDIMER in the last 168 hours.   Radiology    No results found.  Cardiac Studies   TTE (12/09/2020): 1. Left ventricular ejection fraction, by estimation, is 55 to 60%. The  left ventricle has normal function. The left ventricle has no regional  wall motion abnormalities. There is mild left ventricular hypertrophy.  Left ventricular diastolic parameters  are consistent with Grade II diastolic dysfunction (pseudonormalization).  2. Right ventricular systolic function is normal. The right ventricular  size is normal.  3. The mitral valve is normal in structure. No evidence of mitral valve  regurgitation.  4. The aortic valve is grossly normal. Aortic valve regurgitation is not  visualized.   Patient Profile     70 y.o. 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 Mortimer Fries.  Assessment & Plan    Bradycardia: No further episodes noted on telemetry over the last 48 hours.  Episodes earlier this admission most likely reflect vagal event secondary to his acute illness.  Patient seen by Dr. Curt Bears (EP) over the weekend; device implantation was not recommended at that time.  Continue supportive care, including repletion of electrolytes to maintain potassium and magnesium levels greater than 4.0 and 2.0, respectively.  Treat underlying medical illness.  Avoid rate lowering agents including beta-blockers and calcium channel blockers.  PSVT: Brief atrial run noted on telemetry yesterday evening.  Maintained potassium and magnesium levels as outlined above.  Given  brief self-limited episode and bradycardia earlier in this admission, defer adding beta-blocker and nondihydropyridine calcium channel blocker.  Acute on chronic HFrEF: Some edema and abdominal firmness persists, likely multifactorial.  Patient switched from fludrocortisone to midodrine 2 days ago to minimize fluid retention. Creatinine normal though BUN-creatinine ratio still quite elevated.  Patient also hyponatremic suggestive of free water deficit.  Maintain net even to slightly negative fluid balance.  Replenish free water deficit; may require concurrent administration of furosemide to maintain reasonable volume status.  Acute on chronic hypoxic and hypercapnic respiratory failure: Patient remains on ventilator support via tracheostomy.  Per CCM.  [facility] will sign off.   Medication Recommendations: Avoid beta-blockers and nondihydropyridine calcium channel blockers. Other recommendations (labs, testing, etc): Maintain potassium and magnesium levels greater than 4.0 and 2.0, respectively. Follow up as an outpatient: As needed  For questions or updates, please contact [facility] Please consult [email or web address] for contact info under [facility].     Signed, Nelva Bush, MD  12/26/2020, 8:42 AM

## 2020-12-26 NOTE — Progress Notes (Signed)
[facility] INFECTIOUS DISEASE PROGRESS NOTE Date of Admission:  12/07/2020     ID: Ryan Day is a 70 y.o. male with polymicrobial bacteremia, UTI, PNA, decubitus ulcers, quadraplegia Active Problems:   Sepsis ([place])   Subjective: No fevers, wbc stable.  Remains on mech vent via trach. Not on pressors. On propofol. Still with thick secretions but stable on vent.   ROS  Unable to obtain.  Medications:  Antibiotics Given (last 72 hours)    Date/Time Action Medication Dose Rate   12/23/20 2035 New Bag/Given   vancomycin (VANCOREADY) IVPB 500 mg/100 mL 500 mg 100 mL/hr   12/23/20 2234 New Bag/Given   ceFEPIme (MAXIPIME) 2 g in sodium chloride 0.9 % 100 mL IVPB 2 g 200 mL/hr   12/24/20 0548 New Bag/Given   ceFEPIme (MAXIPIME) 2 g in sodium chloride 0.9 % 100 mL IVPB 2 g 200 mL/hr   12/24/20 0915 New Bag/Given   vancomycin (VANCOREADY) IVPB 500 mg/100 mL 500 mg 100 mL/hr   12/24/20 1352 New Bag/Given   ceFEPIme (MAXIPIME) 2 g in sodium chloride 0.9 % 100 mL IVPB 2 g 200 mL/hr   12/24/20 2002 New Bag/Given   vancomycin (VANCOREADY) IVPB 500 mg/100 mL 500 mg 100 mL/hr   12/24/20 2327 New Bag/Given   ceFEPIme (MAXIPIME) 2 g in sodium chloride 0.9 % 100 mL IVPB 2 g 200 mL/hr   12/25/20 0513 New Bag/Given   ceFEPIme (MAXIPIME) 2 g in sodium chloride 0.9 % 100 mL IVPB 2 g 200 mL/hr   12/25/20 0813 New Bag/Given   vancomycin (VANCOREADY) IVPB 500 mg/100 mL 500 mg 100 mL/hr   12/25/20 1513 New Bag/Given   ceFEPIme (MAXIPIME) 2 g in sodium chloride 0.9 % 100 mL IVPB 2 g 200 mL/hr   12/25/20 2358 New Bag/Given   ceFEPIme (MAXIPIME) 2 g in sodium chloride 0.9 % 100 mL IVPB 2 g 200 mL/hr   12/26/20 0520 New Bag/Given   ceFEPIme (MAXIPIME) 2 g in sodium chloride 0.9 % 100 mL IVPB 2 g 200 mL/hr   12/26/20 1147 New Bag/Given   vancomycin (VANCOREADY) IVPB 1000 mg/200 mL 1,000 mg 200 mL/hr     . ascorbic acid  500 mg Per Tube BID  . baclofen  10 mg Per Tube QID  . chlorhexidine gluconate  (MEDLINE KIT)  15 mL Mouth Rinse BID  . Chlorhexidine Gluconate Cloth  6 each Topical Daily  . diclofenac Sodium  4 g Topical QID  . ferrous sulfate  75 mg Per Tube Daily  . FLUoxetine  40 mg Per Tube Daily  . free water  300 mL Per Tube Q2H  . guaiFENesin  400 mg Per Tube Q6H  . insulin aspart  0-15 Units Subcutaneous Q4H  . ipratropium-albuterol  3 mL Nebulization TID  . mouth rinse  15 mL Mouth Rinse 10 times per day  . midodrine  5 mg Per Tube BID WC  . nutrition supplement (JUVEN)  1 packet Per Tube BID BM  . polycarbophil  625 mg Per Tube Daily  . rivaroxaban  10 mg Per Tube Daily  . saccharomyces boulardii  250 mg Per Tube BID  . simethicone  80 mg Per Tube QID  . traZODone  50 mg Per Tube QHS  . zinc sulfate  220 mg Per Tube Daily    Objective: Vital signs in last 24 hours: Temp:  [96.3 F (35.7 C)-99.4 F (37.4 C)] 98.4 F (36.9 C) (04/05 0500) Pulse Rate:  [66-100] 83 (04/05  0600) Resp:  [16-26] 26 (04/05 0600) BP: (81-160)/(49-78) 113/63 (04/05 0600) SpO2:  [90 %-98 %] 94 % (04/05 1133) FiO2 (%):  [40 %-50 %] 50 % (04/05 1133) Weight:  [119.3 kg] 119.3 kg (04/05 0500) Physical Exam  Constitutional: sedated HENT: trach in place Mouth/Throat: Oropharynx is clear and moist  Cardiovascular: Normal rate, regular rhythm and normal heart sounds.   Pulmonary/Chest: poor air movement Abdominal: Midl distention, peg in place Bowel sounds are normal. _0 Lymphadenopathy: He has no cervical adenopathy.  Neurological: mildly sedated, but arousable Skin: decub wounds    Lab Results Recent Labs    12/25/20 0500 12/26/20 0454  WBC 8.5 7.0  HGB 8.2* 8.2*  HCT 26.6* 25.8*  NA 151* 150*  K 3.5 3.6  CL 112* 110  CO2 33* 33*  BUN 60* 55*  CREATININE 0.76 0.77    Microbiology: Results for orders placed or performed during the hospital encounter of 12/07/20  Urine Culture     Status: Abnormal   Collection Time: 12/07/20  5:25 PM   Specimen: Urine, Random  Result  Value Ref Range Status   Specimen Description   Final    URINE, RANDOM Performed at [facility], [address] [place], [place] [address]    Special Requests   Final    NONE Performed at [facility], [address] [place], [place] [address]    Culture >=100,000 COLONIES/mL ENTEROBACTER CLOACAE (A)  Final   Report Status 12/10/2020 FINAL  Final   Organism ID, Bacteria ENTEROBACTER CLOACAE (A)  Final      Susceptibility   Enterobacter cloacae - MIC*    CEFAZOLIN >=64 RESISTANT Resistant     CEFEPIME 0.5 SENSITIVE Sensitive     CIPROFLOXACIN >=4 RESISTANT Resistant     GENTAMICIN <=1 SENSITIVE Sensitive     IMIPENEM 1 SENSITIVE Sensitive     NITROFURANTOIN 64 INTERMEDIATE Intermediate     TRIMETH/SULFA <=20 SENSITIVE Sensitive     PIP/TAZO 32 INTERMEDIATE Intermediate     * >=100,000 COLONIES/mL ENTEROBACTER CLOACAE  Blood culture (routine x 2)     Status: Abnormal   Collection Time: 12/07/20  5:25 PM   Specimen: BLOOD  Result Value Ref Range Status   Specimen Description   Final    BLOOD BLOOD RIGHT ARM Performed at [facility], [address], [place], [place] [address]    Special Requests   Final    BOTTLES DRAWN AEROBIC AND ANAEROBIC Blood Culture adequate volume Performed at [facility], [address] [place], [place] [address]    Culture  Setup Time   Final    GRAM NEGATIVE RODS GRAM POSITIVE COCCI IN BOTH AEROBIC AND ANAEROBIC BOTTLES Organism ID to follow  ;Violeta Gelinas [facility] [id number] 12/08/20 HNM CRITICAL RESULT CALLED TO, READ BACK BY AND VERIFIED WITH: Performed at [facility], [address] [place], [place] [address]    Culture (A)  Final    ENTEROBACTER CLOACAE ENTEROCOCCUS FAECALIS METHICILLIN RESISTANT STAPHYLOCOCCUS AUREUS    Report Status 12/12/2020 FINAL  Final   Organism ID, Bacteria ENTEROBACTER CLOACAE  Final   Organism ID, Bacteria ENTEROCOCCUS FAECALIS  Final   Organism ID, Bacteria  METHICILLIN RESISTANT STAPHYLOCOCCUS AUREUS  Final      Susceptibility   Enterobacter cloacae - MIC*    CEFAZOLIN >=64 RESISTANT Resistant     CEFEPIME 0.5 SENSITIVE Sensitive     CEFTAZIDIME 2 SENSITIVE Sensitive     CIPROFLOXACIN >=4 RESISTANT Resistant  GENTAMICIN <=1 SENSITIVE Sensitive     IMIPENEM 0.5 SENSITIVE Sensitive     TRIMETH/SULFA <=20 SENSITIVE Sensitive     PIP/TAZO 32 INTERMEDIATE Intermediate     * ENTEROBACTER CLOACAE   Enterococcus faecalis - MIC*    AMPICILLIN <=2 SENSITIVE Sensitive     VANCOMYCIN 1 SENSITIVE Sensitive     GENTAMICIN SYNERGY SENSITIVE Sensitive     * ENTEROCOCCUS FAECALIS   Methicillin resistant staphylococcus aureus - MIC*    CIPROFLOXACIN >=8 RESISTANT Resistant     ERYTHROMYCIN >=8 RESISTANT Resistant     GENTAMICIN <=0.5 SENSITIVE Sensitive     OXACILLIN >=4 RESISTANT Resistant     TETRACYCLINE <=1 SENSITIVE Sensitive     VANCOMYCIN 1 SENSITIVE Sensitive     TRIMETH/SULFA >=320 RESISTANT Resistant     CLINDAMYCIN <=0.25 SENSITIVE Sensitive     RIFAMPIN <=0.5 SENSITIVE Sensitive     Inducible Clindamycin NEGATIVE Sensitive     * METHICILLIN RESISTANT STAPHYLOCOCCUS AUREUS  Blood Culture ID Panel (Reflexed)     Status: Abnormal   Collection Time: 12/07/20  5:25 PM  Result Value Ref Range Status   Enterococcus faecalis DETECTED (A) NOT DETECTED Final    Comment: CRITICAL RESULT CALLED TO, READ BACK BY AND VERIFIED WITH: JASON ROBBINS PHARMD 0432 12/08/20 HNM    Enterococcus Faecium NOT DETECTED NOT DETECTED Final   Listeria monocytogenes NOT DETECTED NOT DETECTED Final   Staphylococcus species DETECTED (A) NOT DETECTED Final    Comment: CRITICAL RESULT CALLED TO, READ BACK BY AND VERIFIED WITH: JASON ROBBINS PHARMD 0432 12/08/20 HNM    Staphylococcus aureus (BCID) DETECTED (A) NOT DETECTED Final    Comment: Methicillin (oxacillin)-resistant Staphylococcus aureus (MRSA). MRSA is predictably resistant to beta-lactam antibiotics (except  ceftaroline). Preferred therapy is vancomycin unless clinically contraindicated. Patient requires contact precautions if  hospitalized. CRITICAL RESULT CALLED TO, READ BACK BY AND VERIFIED WITH: JASON ROBBINS PHARMD 0432 12/08/20 HNM    Staphylococcus epidermidis NOT DETECTED NOT DETECTED Final   Staphylococcus lugdunensis NOT DETECTED NOT DETECTED Final   Streptococcus species NOT DETECTED NOT DETECTED Final   Streptococcus agalactiae NOT DETECTED NOT DETECTED Final   Streptococcus pneumoniae NOT DETECTED NOT DETECTED Final   Streptococcus pyogenes NOT DETECTED NOT DETECTED Final   A.calcoaceticus-baumannii NOT DETECTED NOT DETECTED Final   Bacteroides fragilis NOT DETECTED NOT DETECTED Final   Enterobacterales DETECTED (A) NOT DETECTED Final    Comment: Enterobacterales represent a large order of gram negative bacteria, not a single organism. CRITICAL RESULT CALLED TO, READ BACK BY AND VERIFIED WITH: JASON ROBBINS PHARMD 0432 12/08/20 HNM    Enterobacter cloacae complex DETECTED (A) NOT DETECTED Final    Comment: CRITICAL RESULT CALLED TO, READ BACK BY AND VERIFIED WITH: JASON ROBBINS PHARMD 0432 12/08/20 HNM    Escherichia coli NOT DETECTED NOT DETECTED Final   Klebsiella aerogenes NOT DETECTED NOT DETECTED Final   Klebsiella oxytoca NOT DETECTED NOT DETECTED Final   Klebsiella pneumoniae NOT DETECTED NOT DETECTED Final   Proteus species NOT DETECTED NOT DETECTED Final   Salmonella species NOT DETECTED NOT DETECTED Final   Serratia marcescens NOT DETECTED NOT DETECTED Final   Haemophilus influenzae NOT DETECTED NOT DETECTED Final   Neisseria meningitidis NOT DETECTED NOT DETECTED Final   Pseudomonas aeruginosa NOT DETECTED NOT DETECTED Final   Stenotrophomonas maltophilia NOT DETECTED NOT DETECTED Final   Candida albicans NOT DETECTED NOT DETECTED Final   Candida auris NOT DETECTED NOT DETECTED Final   Candida glabrata NOT DETECTED NOT DETECTED   Final   Candida krusei NOT DETECTED  NOT DETECTED Final   Candida parapsilosis NOT DETECTED NOT DETECTED Final   Candida tropicalis NOT DETECTED NOT DETECTED Final   Cryptococcus neoformans/gattii NOT DETECTED NOT DETECTED Final   CTX-M ESBL NOT DETECTED NOT DETECTED Final   Carbapenem resistance IMP NOT DETECTED NOT DETECTED Final   Carbapenem resistance KPC NOT DETECTED NOT DETECTED Final   Meth resistant mecA/C and MREJ DETECTED (A) NOT DETECTED Final    Comment: CRITICAL RESULT CALLED TO, READ BACK BY AND VERIFIED WITH: JASON ROBBINS PHARMD 0432 12/08/20 HNM    Carbapenem resistance NDM NOT DETECTED NOT DETECTED Final   Carbapenem resist OXA 48 LIKE NOT DETECTED NOT DETECTED Final   Vancomycin resistance NOT DETECTED NOT DETECTED Final   Carbapenem resistance VIM NOT DETECTED NOT DETECTED Final    Comment: Performed at [facility], [address] [place], [place] [address]  Resp Panel by RT-PCR (Flu A&B, Covid) Nasopharyngeal Swab     Status: None   Collection Time: 12/07/20  7:24 PM   Specimen: Nasopharyngeal Swab; Nasopharyngeal(NP) swabs in vial transport medium  Result Value Ref Range Status   SARS Coronavirus 2 by RT PCR NEGATIVE NEGATIVE Final    Comment: (NOTE) SARS-CoV-2 target nucleic acids are NOT DETECTED.  The SARS-CoV-2 RNA is generally detectable in upper respiratory specimens during the acute phase of infection. The lowest concentration of SARS-CoV-2 viral copies this assay can detect is 138 copies/mL. A negative result does not preclude SARS-Cov-2 infection and should not be used as the sole basis for treatment or other patient management decisions. A negative result may occur with  improper specimen collection/handling, submission of specimen other than nasopharyngeal swab, presence of viral mutation(s) within the areas targeted by this assay, and inadequate number of viral copies(<138 copies/mL). A negative result must be combined with clinical observations, patient history, and  epidemiological information. The expected result is Negative.  Fact Sheet for Patients:  https://www.fda.gov/media/152166/download  Fact Sheet for Healthcare Providers:  https://www.fda.gov/media/152162/download  This test is no t yet approved or cleared by the [place] FDA and  has been authorized for detection and/or diagnosis of SARS-CoV-2 by FDA under an Emergency Use Authorization (EUA). This EUA will remain  in effect (meaning this test can be used) for the duration of the COVID-19 declaration under Section 564(b)(1) of the Act, 21 U.S.C.section 360bbb-3(b)(1), unless the authorization is terminated  or revoked sooner.       Influenza A by PCR NEGATIVE NEGATIVE Final   Influenza B by PCR NEGATIVE NEGATIVE Final    Comment: (NOTE) The Xpert Xpress SARS-CoV-2/FLU/RSV plus assay is intended as an aid in the diagnosis of influenza from Nasopharyngeal swab specimens and should not be used as a sole basis for treatment. Nasal washings and aspirates are unacceptable for Xpert Xpress SARS-CoV-2/FLU/RSV testing.  Fact Sheet for Patients: https://www.fda.gov/media/152166/download  Fact Sheet for Healthcare Providers: https://www.fda.gov/media/152162/download  This test is not yet approved or cleared by the [place] FDA and has been authorized for detection and/or diagnosis of SARS-CoV-2 by FDA under an Emergency Use Authorization (EUA). This EUA will remain in effect (meaning this test can be used) for the duration of the COVID-19 declaration under Section 564(b)(1) of the Act, 21 U.S.C. section 360bbb-3(b)(1), unless the authorization is terminated or revoked.  Performed at [facility], [address] , [place] [address]   MRSA PCR Screening     Status: Abnormal   Collection Time: 12/07/20  9:08 PM     Specimen: Nasopharyngeal  Result Value Ref Range Status   MRSA by PCR POSITIVE (A) NEGATIVE Final    Comment:        The GeneXpert MRSA Assay  (FDA approved for NASAL specimens only), is one component of a comprehensive MRSA colonization surveillance program. It is not intended to diagnose MRSA infection nor to guide or monitor treatment for MRSA infections. RESULT CALLED TO, READ BACK BY AND VERIFIED WITH: PIERCE DO RN 2245 12/07/20 HNM Performed at [facility], [address] [place], [place] [address]   Culture, blood (Routine X 2) w Reflex to ID Panel     Status: Abnormal   Collection Time: 12/07/20 11:06 PM   Specimen: BLOOD  Result Value Ref Range Status   Specimen Description   Final    BLOOD BLOOD LEFT HAND Performed at [facility], [address] [place], [place] [address]    Special Requests   Final    IN PEDIATRIC BOTTLE Blood Culture adequate volume Performed at [facility], [address] [place], [place] [address]    Culture  Setup Time   Final    IN PEDIATRIC BOTTLE GRAM POSITIVE COCCI CRITICAL RESULT CALLED TO, READ BACK BY AND VERIFIED WITH: SUSAN WATSON @1426 12/08/20 MJU Performed at [facility], [address] [place], [place] [address]    Culture (A)  Final    STAPHYLOCOCCUS AUREUS ENTEROCOCCUS FAECALIS SUSCEPTIBILITIES PERFORMED ON PREVIOUS CULTURE WITHIN THE LAST 5 DAYS. Performed at [facility], [address] [place], [place] [address]    Report Status 12/12/2020 FINAL  Final  Culture, Respiratory w Gram Stain     Status: None   Collection Time: 12/08/20  4:53 AM   Specimen: Tracheal Aspirate; Respiratory  Result Value Ref Range Status   Specimen Description   Final    TRACHEAL ASPIRATE Performed at [facility], [address] [place], [place] [address]    Special Requests   Final    NONE Performed at [facility], [address] , [place] [address]    Gram Stain   Final    NO WBC SEEN RARE GRAM POSITIVE COCCI IN PAIRS RARE GRAM VARIABLE ROD Performed at [facility], [address]  [place], [place] [address]    Culture RARE PSEUDOMONAS AERUGINOSA  Final   Report Status 12/10/2020 FINAL  Final   Organism ID, Bacteria PSEUDOMONAS AERUGINOSA  Final      Susceptibility   Pseudomonas aeruginosa - MIC*    CEFTAZIDIME 32 RESISTANT Resistant     CIPROFLOXACIN 1 SENSITIVE Sensitive     GENTAMICIN <=1 SENSITIVE Sensitive     IMIPENEM 2 SENSITIVE Sensitive     * RARE PSEUDOMONAS AERUGINOSA  Culture, BAL-quantitative w Gram Stain     Status: Abnormal   Collection Time: 12/08/20  3:00 PM   Specimen: Bronchoalveolar Lavage; Respiratory  Result Value Ref Range Status   Specimen Description   Final    BRONCHIAL ALVEOLAR LAVAGE Performed at [facility], [address] , [place] [address]    Special Requests   Final    NONE Performed at  Hospital Lab, [address] , [place] [address]    Gram Stain   Final    ABUNDANT WBC PRESENT, PREDOMINANTLY PMN ABUNDANT SQUAMOUS EPITHELIAL CELLS PRESENT ABUNDANT GRAM POSITIVE COCCI MODERATE GRAM NEGATIVE RODS Performed at [facility], [address] , [place] [address]    Culture (A)    Final    >=100,000 COLONIES/mL PSEUDOMONAS AERUGINOSA 40,000 COLONIES/mL METHICILLIN RESISTANT STAPHYLOCOCCUS AUREUS 20,000 COLONIES/mL ENTEROCOCCUS FAECALIS    Report Status 12/12/2020 FINAL  Final   Organism ID, Bacteria PSEUDOMONAS AERUGINOSA (A)  Final   Organism ID, Bacteria METHICILLIN RESISTANT STAPHYLOCOCCUS AUREUS (A)  Final   Organism ID, Bacteria ENTEROCOCCUS FAECALIS (A)  Final      Susceptibility   Enterococcus faecalis - MIC*    AMPICILLIN <=2 SENSITIVE Sensitive     VANCOMYCIN 1 SENSITIVE Sensitive     GENTAMICIN SYNERGY SENSITIVE Sensitive     * 20,000 COLONIES/mL ENTEROCOCCUS FAECALIS   Methicillin resistant staphylococcus aureus - MIC*    CIPROFLOXACIN >=8 RESISTANT Resistant     ERYTHROMYCIN >=8 RESISTANT Resistant     GENTAMICIN <=0.5 SENSITIVE Sensitive     OXACILLIN >=4  RESISTANT Resistant     TETRACYCLINE <=1 SENSITIVE Sensitive     VANCOMYCIN <=0.5 SENSITIVE Sensitive     TRIMETH/SULFA >=320 RESISTANT Resistant     CLINDAMYCIN <=0.25 SENSITIVE Sensitive     RIFAMPIN <=0.5 SENSITIVE Sensitive     Inducible Clindamycin NEGATIVE Sensitive     * 40,000 COLONIES/mL METHICILLIN RESISTANT STAPHYLOCOCCUS AUREUS   Pseudomonas aeruginosa - MIC*    CEFTAZIDIME 4 SENSITIVE Sensitive     CIPROFLOXACIN <=0.25 SENSITIVE Sensitive     GENTAMICIN <=1 SENSITIVE Sensitive     IMIPENEM 8 INTERMEDIATE Intermediate     * >=100,000 COLONIES/mL PSEUDOMONAS AERUGINOSA  Aspergillus Ag, BAL/Serum     Status: None   Collection Time: 12/08/20  3:00 PM   Specimen: Bronchoalveolar Lavage; Respiratory  Result Value Ref Range Status   Aspergillus Ag, BAL/Serum WRORD Index Final    Comment: (NOTE) Test not performed. The required specimen for the test ordered was not received.      Received Room Temp sterile container (BAL).      Requires BAL refrigerated (24 hr stability) or preferably      frozen BAL (5 month stability).      Angela Rehm notified 12/14/2020   Anaerobic culture w Gram Stain     Status: None   Collection Time: 12/08/20  3:00 PM   Specimen: Bronchoalveolar Lavage; Respiratory  Result Value Ref Range Status   Specimen Description   Final    BRONCHIAL ALVEOLAR LAVAGE Performed at [facility], [address] [place], [place] [address]    Special Requests   Final    NONE Performed at [facility], [address] [place], [place] [address]    Gram Stain   Final    ABUNDANT WBC PRESENT, PREDOMINANTLY PMN ABUNDANT SQUAMOUS EPITHELIAL CELLS PRESENT ABUNDANT GRAM POSITIVE COCCI MODERATE GRAM NEGATIVE RODS    Culture   Final    NO ANAEROBES ISOLATED Performed at [facility], [address] [place], [place] [address]    Report Status 12/14/2020 FINAL  Final  Aerobic/Anaerobic Culture w Gram Stain (surgical/deep wound)      Status: None   Collection Time: 12/11/20  9:38 PM   Specimen: Sacral; Wound  Result Value Ref Range Status   Specimen Description   Final    SACRAL Performed at [facility], [address] [place], [place] [address]    Special Requests   Final    NONE Performed at [facility], [address] [place], [place] [address]    Gram Stain   Final    FEW WBC PRESENT, PREDOMINANTLY PMN ABUNDANT GRAM VARIABLE ROD MODERATE GRAM NEGATIVE RODS FEW GRAM   POSITIVE COCCI    Culture   Final    FEW METHICILLIN RESISTANT STAPHYLOCOCCUS AUREUS RARE PSEUDOMONAS AERUGINOSA RARE CANDIDA ALBICANS FEW BACTEROIDES THETAIOTAOMICRON BETA LACTAMASE POSITIVE Performed at [facility], [place] [address] [place], [place] [address]    Report Status 12/15/2020 FINAL  Final   Organism ID, Bacteria METHICILLIN RESISTANT STAPHYLOCOCCUS AUREUS  Final   Organism ID, Bacteria PSEUDOMONAS AERUGINOSA  Final      Susceptibility   Methicillin resistant staphylococcus aureus - MIC*    CIPROFLOXACIN >=8 RESISTANT Resistant     ERYTHROMYCIN >=8 RESISTANT Resistant     GENTAMICIN <=0.5 SENSITIVE Sensitive     OXACILLIN >=4 RESISTANT Resistant     TETRACYCLINE <=1 SENSITIVE Sensitive     VANCOMYCIN 1 SENSITIVE Sensitive     TRIMETH/SULFA >=320 RESISTANT Resistant     CLINDAMYCIN <=0.25 SENSITIVE Sensitive     RIFAMPIN <=0.5 SENSITIVE Sensitive     Inducible Clindamycin NEGATIVE Sensitive     * FEW METHICILLIN RESISTANT STAPHYLOCOCCUS AUREUS   Pseudomonas aeruginosa - MIC*    CEFTAZIDIME >=64 RESISTANT Resistant     CIPROFLOXACIN 1 SENSITIVE Sensitive     GENTAMICIN 8 INTERMEDIATE Intermediate     IMIPENEM 2 SENSITIVE Sensitive     CEFEPIME >=32 RESISTANT Resistant     * RARE PSEUDOMONAS AERUGINOSA  CULTURE, BLOOD (ROUTINE X 2) w Reflex to ID Panel     Status: None   Collection Time: 12/13/20 12:27 PM   Specimen: BLOOD  Result Value Ref Range Status   Specimen Description BLOOD LEFT H   Final   Special Requests   Final    BOTTLES DRAWN AEROBIC AND ANAEROBIC Blood Culture adequate volume   Culture   Final    NO GROWTH 5 DAYS Performed at [facility], [address] [place], [place] [address]    Report Status 12/18/2020 FINAL  Final  CULTURE, BLOOD (ROUTINE X 2) w Reflex to ID Panel     Status: None   Collection Time: 12/13/20  1:30 PM   Specimen: BLOOD  Result Value Ref Range Status   Specimen Description BLOOD BLOOD LEFT HAND  Final   Special Requests   Final    BOTTLES DRAWN AEROBIC AND ANAEROBIC Blood Culture adequate volume   Culture   Final    NO GROWTH 5 DAYS Performed at [facility], [address], [place], [place] [address]    Report Status 12/18/2020 FINAL  Final  Culture, Respiratory w Gram Stain     Status: None   Collection Time: 12/17/20  2:07 PM   Specimen: Tracheal Aspirate; Respiratory  Result Value Ref Range Status   Specimen Description   Final    TRACHEAL ASPIRATE Performed at [facility], [address], [place], [place] [address]    Special Requests   Final    NONE Performed at [facility], [address] [place], [place] [address]    Gram Stain   Final    RARE WBC PRESENT, PREDOMINANTLY PMN MODERATE GRAM NEGATIVE RODS Performed at [facility], [place] [address] [place], [place] [address]    Culture FEW PSEUDOMONAS AERUGINOSA  Final   Report Status 12/21/2020 FINAL  Final   Organism ID, Bacteria PSEUDOMONAS AERUGINOSA  Final      Susceptibility   Pseudomonas aeruginosa - MIC*    CEFTAZIDIME >=64 RESISTANT Resistant     CIPROFLOXACIN 2 INTERMEDIATE Intermediate     GENTAMICIN <=1 SENSITIVE Sensitive  IMIPENEM >=16 RESISTANT Resistant     CEFEPIME >=32 RESISTANT Resistant     * FEW PSEUDOMONAS AERUGINOSA    Studies/Results: No results found.  Assessment/Plan: Polymicrobial bacteremia with sepsis. MRSA, E faecalis, Enterobacter cloacae in blood culture. Pseudomonas in in  BAL culture. Has bilateral pulmonary infiltrates- treated with 2 antipseudomonal antibiotics Urine culture had Enterobacter. He has Foley catheter Continue IV vanco and IV cefepime . Duration will depend on his progress- may need upto 4 weeks   Acute on chronic hypoxic respiratory failure secondary to frequent mucous plugging. Bilateral pulmonary infiltrates being treated as HCAP. 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  PEG in place  Neurogenic bladder with Foley catheter.  Thank you very much for the consult. Will follow with you.  Leonel Ramsay   12/26/2020, 1:35 PM

## 2020-12-26 NOTE — Consult Note (Addendum)
[place] for Electrolyte Monitoring and Replacement   Recent Labs: Potassium (mmol/L)  Date Value  12/26/2020 3.6   Magnesium (mg/dL)  Date Value  12/26/2020 2.5 (H)   Calcium (mg/dL)  Date Value  12/26/2020 10.2   Albumin (g/dL)  Date Value  12/26/2020 2.6 (L)  02/01/2020 4.4   Phosphorus (mg/dL)  Date Value  12/26/2020 2.9   Sodium (mmol/L)  Date Value  12/26/2020 150 (H)  02/01/2020 143    Assessment: Patient is a 70 y/o M presented with sepsis. Pt presented with complaints of recurrent UTI and hematuria. 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 Pt has chronic tracheostomy and PEG tube. PMH includes HTN and HLD. Pharmacy was consulted to assist with electrolyte monitoring and replacement as indicated. His sodium has been trending up but appears to be leveling off. He is being followed by cardiology and had had noted episodes of bradycardia, PSVT.  Nutrition: tube feeds @ 75 mL/hr + Juven 1 packet BID   Fluids: Free water per tube at 300 mL Q2h (3600 mL/day)   Diuretics: acetazolamide 250 mg per tube x 1, acetazolamide 500 mg IV x 1  Goal of Therapy:  Potassium 4.0 - 5.1 mmol/L Magnesium 2.0 - 2.4 mg/dL All Other Electrolytes WNL  Plan:   continue free water 300 mL per tube q2h (3600 mL/day)  40 mEq KCl per tube x 1   Follow-up electrolytes with AM labs  Vallery Sa, PharmD 12/26/2020 7:15 AM

## 2020-12-27 DIAGNOSIS — E861 Hypovolemia: Secondary | ICD-10-CM

## 2020-12-27 DIAGNOSIS — N179 Acute kidney failure, unspecified: Secondary | ICD-10-CM | POA: Diagnosis not present

## 2020-12-27 DIAGNOSIS — J690 Pneumonitis due to inhalation of food and vomit: Secondary | ICD-10-CM | POA: Diagnosis not present

## 2020-12-27 DIAGNOSIS — D649 Anemia, unspecified: Secondary | ICD-10-CM | POA: Diagnosis not present

## 2020-12-27 DIAGNOSIS — A419 Sepsis, unspecified organism: Secondary | ICD-10-CM | POA: Diagnosis not present

## 2020-12-27 DIAGNOSIS — I469 Cardiac arrest, cause unspecified: Secondary | ICD-10-CM

## 2020-12-27 LAB — CBC
HCT: 26.4 % — ABNORMAL LOW (ref 39.0–52.0)
Hemoglobin: 8.2 g/dL — ABNORMAL LOW (ref 13.0–17.0)
MCH: 29.3 pg (ref 26.0–34.0)
MCHC: 31.1 g/dL (ref 30.0–36.0)
MCV: 94.3 fL (ref 80.0–100.0)
Platelets: 260 10*3/uL (ref 150–400)
RBC: 2.8 MIL/uL — ABNORMAL LOW (ref 4.22–5.81)
RDW: 16.6 % — ABNORMAL HIGH (ref 11.5–15.5)
WBC: 6.2 10*3/uL (ref 4.0–10.5)
nRBC: 0 % (ref 0.0–0.2)

## 2020-12-27 LAB — GLUCOSE, CAPILLARY
Glucose-Capillary: 156 mg/dL — ABNORMAL HIGH (ref 70–99)
Glucose-Capillary: 132 mg/dL — ABNORMAL HIGH (ref 70–99)
Glucose-Capillary: 116 mg/dL — ABNORMAL HIGH (ref 70–99)
Glucose-Capillary: 140 mg/dL — ABNORMAL HIGH (ref 70–99)
Glucose-Capillary: 128 mg/dL — ABNORMAL HIGH (ref 70–99)
Glucose-Capillary: 124 mg/dL — ABNORMAL HIGH (ref 70–99)

## 2020-12-27 LAB — BASIC METABOLIC PANEL
Anion gap: 3 — ABNORMAL LOW (ref 5–15)
BUN: 56 mg/dL — ABNORMAL HIGH (ref 8–23)
CO2: 32 mmol/L (ref 22–32)
Calcium: 10.3 mg/dL (ref 8.9–10.3)
Chloride: 112 mmol/L — ABNORMAL HIGH (ref 98–111)
Creatinine, Ser: 0.8 mg/dL (ref 0.61–1.24)
GFR, Estimated: >60 mL/min (ref >60)
Glucose, Bld: 136 mg/dL — ABNORMAL HIGH (ref 70–99)
Potassium: 3.8 mmol/L (ref 3.5–5.1)
Sodium: 147 mmol/L — ABNORMAL HIGH (ref 135–145)

## 2020-12-27 LAB — PHOSPHORUS: Phosphorus: 3 mg/dL (ref 2.5–4.6)

## 2020-12-27 LAB — MAGNESIUM: Magnesium: 2.6 mg/dL — ABNORMAL HIGH (ref 1.7–2.4)

## 2020-12-27 MED ORDER — POTASSIUM CHLORIDE 20 MEQ PO PACK
40.0000 meq | PACK | Freq: Once | ORAL | Status: AC
  Administered 2020-12-27: 40 meq
  Filled 2020-12-27: qty 2
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
## 2020-12-27 NOTE — Progress Notes (Signed)
NAME:  Ryan Day, MRN:  [id number], DOB:  [birth year], LOS: 17 ADMISSION DATE:  12/07/2020  BRIEF SYNOPSIS 70 yo quadriplegic male with a chronic tracheostomy who presented to [facility] ER on 03/17 with hematuria. According to pts family last week pt noted to have sediment in his urine at home concerning for UTI. He had a foley catheter placed 2 weeks ago and completed a 7 day course of abx therapy due to UTI. Due to concerns of recurrent UTI pts family contacted pts PCP office last week, and heard from his PCP this week and were awaiting urine culture results. However, pt noted to have hematuria yesterday and today along with sinus tachycardia hr 120's. Pts daughter is a Marine scientist and due to concern of sepsis pt transported to the ER via EMS for further evaluation and treatment.    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 C5 Cervical Fracture (following a mechanical fall at home) s/p C3-6 Laminectomy and Posterior Instrumentation and Fusion 08/4  Quadriplegia  Spinal Cord Compression  Chronic Tracheostomy and PEG   Significant Hospital Events: Including procedures, antibiotic start and stop dates in addition to other pertinent events    Significant Hospital Events: Including procedures, antibiotic start and stop dates in addition to other pertinent events   03/17: Pt admitted to ICU with urosepsis requiring vasopressors and acute on chronic hypoxic respiratory failure secondary to mucous plugging requiring exchange of cuffless trach to size 6 mm cuffed trach to be placed on mechanical ventilation  03/17: Right femoral central lineplaced by ER physician  03/17: CT Abd Pelvis findings with cystitis. Correlation with urinalysis is Recommended. Mild to moderate severity bibasilar atelectasis and/or Infiltrate. Sigmoid diverticulosis. Sacral decubitus ulcer, as described above. MRI correlation is recommended, as sequelae  associated with acute osteomyelitis cannot be excluded. Aortic atherosclerosis. 03/17: CT Head revealed mild chronic ischemic white matter disease. No acute intracranial abnormality seen 03/17: Vancomycin x1 dose and Cefepime x1 dose 03/17: Zosyn>>completed 03/27: On Cefepime 3/30severe bradycardia with decrease in PS mode 3/31 patient with increased WOB placed on full vent support 4/1 remains on vent,+pneumonia, failure to wean from vent 4/2 severe resp distress, on full vent support 4/4 patient failed another SBT he became bradycardic unresponsive still not able to communicate with providers wife is at the bedside and 2 daughters are still deciding about how to proceed with care 4/5 wife and kids are by the bedside long discussion about prognosis they are still deciding what should happen.  Micro 12/07/2020 blood culture has methicillin-resistant staph aureus, Enterobacter cloacae and Enterococcus faecalis  12/13/2020 blood culture: No growth so far  12/08/2020  bronchoalveolar lavage more than 100,000 colonies of Pseudomonas aeruginosa Methicillin-resistant staph aureus 40,000 colonies Enterococcus faecalis 20,000.  12/07/2020 urine culture Enterobacter cloacae   Sacral wound has rare Pseudomonas aeruginosa rare methicillin-resistant staph aureus few Bacteroides and rare Candida albicans. Likely stool contamination  Anti-infectives (From admission, onward)   Start     Dose/Rate Route Frequency Ordered Stop   12/26/20 1200  vancomycin (VANCOREADY) IVPB 1000 mg/200 mL        1,000 mg 200 mL/hr over 60 Minutes Intravenous Every 24 hours 12/26/20 1044     12/26/20 1100  vancomycin (VANCOREADY) IVPB 750 mg/150 mL  Status:  Discontinued        750 mg 150 mL/hr over 60 Minutes Intravenous Every 24 hours 12/26/20 0947 12/26/20 1000   12/26/20 1100  vancomycin (VANCOCIN) 750 mg in sodium  chloride 0.9 % 250 mL IVPB  Status:  Discontinued        750 mg 250 mL/hr over 60 Minutes  Intravenous Every 24 hours 12/26/20 1001 12/26/20 1044   12/25/20 1339  vancomycin variable dose per unstable renal function (pharmacist dosing)  Status:  Discontinued         Does not apply See admin instructions 12/25/20 1339 12/26/20 0947   12/20/20 2000  vancomycin (VANCOREADY) IVPB 500 mg/100 mL  Status:  Discontinued        500 mg 100 mL/hr over 60 Minutes Intravenous Every 12 hours 12/20/20 1709 12/25/20 1339   12/17/20 1400  vancomycin (VANCOREADY) IVPB 750 mg/150 mL  Status:  Discontinued        750 mg 150 mL/hr over 60 Minutes Intravenous Every 12 hours 12/17/20 1159 12/20/20 0604   12/13/20 1000  vancomycin (VANCOREADY) IVPB 1000 mg/200 mL  Status:  Discontinued        1,000 mg 200 mL/hr over 60 Minutes Intravenous Every 12 hours 12/13/20 0832 12/17/20 1159   12/12/20 1800  ciprofloxacin (CIPRO) IVPB 400 mg  Status:  Discontinued        400 mg 200 mL/hr over 60 Minutes Intravenous Every 8 hours 12/12/20 1620 12/21/20 1529   12/12/20 1700  ciprofloxacin (CIPRO) IVPB 400 mg  Status:  Discontinued        400 mg 200 mL/hr over 60 Minutes Intravenous Every 12 hours 12/12/20 1609 12/12/20 1620   12/12/20 1615  vancomycin variable dose per unstable renal function (pharmacist dosing)  Status:  Discontinued         Does not apply See admin instructions 12/12/20 1615 12/13/20 1050   12/12/20 1100  vancomycin (VANCOREADY) IVPB 1750 mg/350 mL        1,750 mg 175 mL/hr over 120 Minutes Intravenous  Once 12/12/20 0957 12/12/20 1301   12/12/20 0600  ceFEPIme (MAXIPIME) 2 g in sodium chloride 0.9 % 100 mL IVPB        2 g 200 mL/hr over 30 Minutes Intravenous Every 8 hours 12/11/20 1929     12/11/20 1522  vancomycin variable dose per unstable renal function (pharmacist dosing)  Status:  Discontinued         Does not apply See admin instructions 12/11/20 1522 12/12/20 1607   12/11/20 0900  vancomycin (VANCOREADY) IVPB 1000 mg/200 mL  Status:  Discontinued        1,000 mg 200 mL/hr over 60  Minutes Intravenous Every 12 hours 12/11/20 0813 12/11/20 1522   12/10/20 1515  meropenem (MERREM) 1 g in sodium chloride 0.9 % 100 mL IVPB        1 g 200 mL/hr over 30 Minutes Intravenous Every 8 hours 12/10/20 1418 12/12/20 0834   12/09/20 1000  ceFEPIme (MAXIPIME) 2 g in sodium chloride 0.9 % 100 mL IVPB  Status:  Discontinued        2 g 200 mL/hr over 30 Minutes Intravenous Every 8 hours 12/09/20 0811 12/10/20 1418   12/09/20 0730  vancomycin (VANCOREADY) IVPB 2000 mg/400 mL        2,000 mg 200 mL/hr over 120 Minutes Intravenous  Once 12/09/20 0644 12/09/20 1616   12/09/20 0600  vancomycin (VANCOREADY) IVPB 1750 mg/350 mL  Status:  Discontinued        1,750 mg 175 mL/hr over 120 Minutes Intravenous Every 24 hours 12/08/20 0809 12/08/20 1229   12/08/20 2200  ceFEPIme (MAXIPIME) 2 g in sodium chloride 0.9 % 100 mL  IVPB  Status:  Discontinued        2 g 200 mL/hr over 30 Minutes Intravenous Every 12 hours 12/08/20 0809 12/09/20 0811   12/08/20 1700  vancomycin (VANCOREADY) IVPB 1500 mg/300 mL  Status:  Discontinued        1,500 mg 150 mL/hr over 120 Minutes Intravenous Every 12 hours 12/08/20 0500 12/08/20 0809   12/08/20 1230  vancomycin variable dose per unstable renal function (pharmacist dosing)  Status:  Discontinued         Does not apply See admin instructions 12/08/20 1230 12/11/20 1359   12/08/20 0545  ceFEPIme (MAXIPIME) 2 g in sodium chloride 0.9 % 100 mL IVPB  Status:  Discontinued        2 g 200 mL/hr over 30 Minutes Intravenous Every 8 hours 12/08/20 0446 12/08/20 0809   12/08/20 0530  vancomycin (VANCOREADY) IVPB 2000 mg/400 mL       "Followed by" Linked Group Details   2,000 mg 200 mL/hr over 120 Minutes Intravenous  Once 12/08/20 0441 12/08/20 0815   12/08/20 0530  vancomycin (VANCOREADY) IVPB 500 mg/100 mL  Status:  Discontinued       "Followed by" Linked Group Details   500 mg 100 mL/hr over 60 Minutes Intravenous  Once 12/08/20 0441 12/08/20 0803   12/07/20 2215   piperacillin-tazobactam (ZOSYN) IVPB 3.375 g  Status:  Discontinued        3.375 g 12.5 mL/hr over 240 Minutes Intravenous Every 8 hours 12/07/20 2124 12/08/20 0447   12/07/20 1800  ceFEPIme (MAXIPIME) 2 g in sodium chloride 0.9 % 100 mL IVPB        2 g 200 mL/hr over 30 Minutes Intravenous  Once 12/07/20 1757 12/07/20 2036   12/07/20 1800  vancomycin (VANCOCIN) IVPB 1000 mg/200 mL premix        1,000 mg 200 mL/hr over 60 Minutes Intravenous  Once 12/07/20 1757 12/07/20 1942         Interim History / Subjective:  Severe resp failure Remains critically ill Failure to wean from vent Remains on full vent support      Objective   Blood pressure 104/62, pulse 64, temperature 97.8 F (36.6 C), temperature source Oral, resp. rate (!) 23, height 6' 5.01" (1.956 m), weight 118.1 kg, SpO2 100 %.    Vent Mode: PRVC FiO2 (%):  [40 %-50 %] 40 % Set Rate:  [18 bmp] 18 bmp Vt Set:  [500 mL] 500 mL PEEP:  [5 cmH20] 5 cmH20 Plateau Pressure:  [16 cmH20] 16 cmH20   Intake/Output Summary (Last 24 hours) at 12/27/2020 0853 Last data filed at 12/27/2020 0600 Gross per 24 hour  Intake 3631.43 ml  Output 2670 ml  Net 961.43 ml   Filed Weights   12/25/20 0253 12/26/20 0500 12/27/20 0400  Weight: 116 kg 119.3 kg 118.1 kg      REVIEW OF SYSTEMS  PATIENT IS UNABLE TO PROVIDE COMPLETE REVIEW OF SYSTEMS DUE TO SEVERE CRITICAL ILLNESS AND TOXIC METABOLIC ENCEPHALOPATHY   PHYSICAL EXAMINATION:  GENERAL:critically ill appearing, +resp distress HEAD: Normocephalic, atraumatic.  EYES: Pupils equal, round, reactive to light.  No scleral icterus.  MOUTH: Moist mucosal membrane. NECK: Supple. No thyromegaly. No nodules. No JVD.  PULMONARY: +rhonchi, +wheezing CARDIOVASCULAR: S1 and S2. Regular rate and rhythm. No murmurs, rubs, or gallops.  GASTROINTESTINAL: Soft, nontender, -distended. Positive bowel sounds.  MUSCULOSKELETAL: No swelling, clubbing, or edema.  NEUROLOGIC:  obtunded SKIN:intact,warm,dry   Labs/imaging that I havepersonally reviewed  (right  click and "Reselect all SmartList Selections" daily)    Resolved Hospital Problem list   NA   ASSESSMENT AND PLAN SYNOPSIS   70 yo quadriplegic with multiple mecial issues with acute hypoxic resp failure due to severe polymicrobial sepsis and septic shockfrom pseudomonal infections of lungs and sacral decub  Failure to wean from vent   Severe ACUTE Hypoxic and Hypercapnic Respiratory Failure -continue Mechanical Ventilator support -continue Bronchodilator Therapy -Wean Fio2 and PEEP as tolerated -VAP/VENT bundle implementation -Patient is not able to tolerate SAT SBT now and continue supportive care continuous discussions with the family and consideration of LTAC to consider long weaning    CARDIAC FAILURE-acute diastolic dysfunction -oxygen as needed -Lasix as tolerated -follow up cardiac enzymes as indicated   CARDIAC ICU monitoring   ACUTE KIDNEY INJURY/Renal Failure -continue Foley Catheter-assess need -Avoid nephrotoxic agents -Follow urine output, BMP -Ensure adequate renal perfusion, optimize oxygenation -Renal dose medications  Intake/Output Summary (Last 24 hours) at 12/27/2020 0853 Last data filed at 12/27/2020 0600 Gross per 24 hour  Intake 3631.43 ml  Output 2670 ml  Net 961.43 ml     NEUROLOGY Acute toxic metabolic encephalopathy Sedation as needed   SEPTIC SHOCK -use vasopressors to keep MAP>65 as needed   INFECTIOUS DISEASE -continue antibiotics as prescribed -follow up cultures -follow up ID consultation  ENDO - ICU hypoglycemic\Hyperglycemia protocol -check FSBS per protocol   GI GI PROPHYLAXIS as indicated  NUTRITIONAL STATUS DIET-->TF's as tolerated Constipation protocol as indicated   ELECTROLYTES -follow labs as needed -replace as needed -pharmacy consultation and following      Best practice (evaluated daily)  Diet: NPO  is on tube feed Pain/Anxiety/Delirium protocol (if indicated): Yes (RASS goal 0) VAP protocol (if indicated): Yes DVT prophylaxis: Systemic AC GI prophylaxis: H2B Glucose control: SSI No Central venous access: Yes, and it is still needed PICC line right upper extremity Arterial line: N/A Foley: Yes, and it is still needed Mobility: bed rest PT consulted: N/A Last date of multidisciplinary goals of care discussion[N/A] Code Status: full code Disposition:ICU     Labs   CBC: Recent Labs  Lab 12/23/20 0421 12/24/20 0416 12/25/20 0500 12/26/20 0454 12/27/20 0502  WBC 7.7 9.7 8.5 7.0 6.2  NEUTROABS  --   --  5.7 4.4  --   HGB 7.8* 8.0* 8.2* 8.2* 8.2*  HCT 25.2* 26.0* 26.6* 25.8* 26.4*  MCV 91.6 93.5 94.3 93.1 94.3  PLT 331 316 318 288 147    Basic Metabolic Panel: Recent Labs  Lab 12/23/20 0421 12/24/20 0416 12/25/20 0500 12/26/20 0454 12/27/20 0502  NA 146* 148* 151* 150* 147*  K 3.8 3.2* 3.5 3.6 3.8  CL 108 108 112* 110 112*  CO2 31 34* 33* 33* 32  GLUCOSE 124* 149* 159* 138* 136*  BUN 45* 51* 60* 55* 56*  CREATININE 0.63 0.76 0.76 0.77 0.80  CALCIUM 9.7 10.0 10.1 10.2 10.3  MG 2.2 2.5* 2.5* 2.5* 2.6*  PHOS 2.8 3.4 3.0 2.9 3.0   GFR: Estimated Creatinine Clearance: 125.9 mL/min (by C-G formula based on SCr of 0.8 mg/dL). Recent Labs  Lab 12/24/20 0416 12/25/20 0500 12/26/20 0454 12/27/20 0502  WBC 9.7 8.5 7.0 6.2    Liver Function Tests: Recent Labs  Lab 12/26/20 0454  AST 23  ALT 23  ALKPHOS 126  BILITOT 0.7  PROT 7.4  ALBUMIN 2.6*   No results for input(s): LIPASE, AMYLASE in the last 168 hours. No results for input(s): AMMONIA in the last 168  hours.  ABG    Component Value Date/Time   PHART 7.45 12/26/2020 0447   PCO2ART 52 (H) 12/26/2020 0447   PO2ART 83 12/26/2020 0447   HCO3 36.1 (H) 12/26/2020 0447   ACIDBASEDEF 0.4 12/08/2020 0500   O2SAT 96.6 12/26/2020 0447     Coagulation Profile: No results for input(s): INR,  PROTIME in the last 168 hours.  Cardiac Enzymes: No results for input(s): CKTOTAL, CKMB, CKMBINDEX, TROPONINI in the last 168 hours.  HbA1C: Hgb A1c MFr Bld  Date/Time Value Ref Range Status  06/09/2020 07:00 AM 5.8 (H) 4.8 - 5.6 % Final    Comment:    (NOTE) Pre diabetes:          5.7%-6.4%  Diabetes:              >6.4%  Glycemic control for   <7.0% adults with diabetes     CBG: Recent Labs  Lab 12/26/20 1606 12/26/20 1923 12/26/20 2335 12/27/20 0345 12/27/20 0749  GLUCAP 118* 163* 132* 156* 132*    Allergies Allergies  Allergen Reactions  . Shrimp (Diagnostic)     Experiences facial droop when eating seafood       DVT/GI PRX  assessed I Assessed the need for Labs I Assessed the need for Foley I Assessed the need for Central Venous Line Family Discussion when available I Assessed the need for Mobilization I made an Assessment of medications to be adjusted accordingly Safety Risk assessment completed  CASE DISCUSSED IN MULTIDISCIPLINARY ROUNDS WITH ICU TEAM       Overall, patient is critically ill, prognosis is guarded.  Patient with Multiorgan failure and at high risk for cardiac arrest and death.   Very poor chance of meaningful recovery Consideration of LTAC.

## 2020-12-27 NOTE — Consult Note (Signed)
[place] for Electrolyte Monitoring and Replacement   Recent Labs: Potassium (mmol/L)  Date Value  12/27/2020 3.8   Magnesium (mg/dL)  Date Value  12/27/2020 2.6 (H)   Calcium (mg/dL)  Date Value  12/27/2020 10.3   Albumin (g/dL)  Date Value  12/26/2020 2.6 (L)  02/01/2020 4.4   Phosphorus (mg/dL)  Date Value  12/27/2020 3.0   Sodium (mmol/L)  Date Value  12/27/2020 147 (H)  02/01/2020 143    Assessment: Patient is a 70 y/o M presented with sepsis. Pt presented with complaints of recurrent UTI and hematuria. 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 Pt has chronic tracheostomy and PEG tube. PMH includes HTN and HLD. Pharmacy was consulted to assist with electrolyte monitoring and replacement as indicated. His sodium has been trending up but appears to be leveling off. He is being followed by cardiology and had had noted episodes of bradycardia, PSVT.  Nutrition: tube feeds @ 75 mL/hr + Juven 1 packet BID   Fluids: Free water per tube at 300 mL Q2h (3600 mL/day)   Goal of Therapy:  Potassium 4.0 - 5.1 mmol/L Magnesium 2.0 - 2.4 mg/dL All Other Electrolytes WNL  Plan:   Na 147 (improving) - continue free water 300 mL per tube q2h (3600 mL/day)  K 3.8 - ordered 40 mEq KCl per tube x 1   Follow-up electrolytes with AM labs  Benn Moulder, PharmD Pharmacy Resident  12/27/2020 7:06 AM

## 2020-12-27 NOTE — Progress Notes (Signed)
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 According to pts family last week pt noted to have sediment in his urine at home concerning for UTI. He had a foley catheter placed 2 weeks ago and completed a 7 day course of abx therapy due to UTI. Due to concerns of recurrent UTI pts family contacted pts PCP office last week, and heard from his PCP this week and were awaiting urine culture results. However, pt noted to have hematuria yesterday and today along with sinus tachycardia hr 120's. Pts daughter is a Marine scientist and due to concern of sepsis pt transported to the ER via EMS for further evaluation and treatment.    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 C5 Cervical Fracture (following a mechanical fall at home) s/p C3-6 Laminectomy and Posterior Instrumentation and Fusion 08/4  Quadriplegia  Spinal Cord Compression  Chronic Tracheostomy and PEG   Significant Hospital Events: Including procedures, antibiotic start and stop dates in addition to other pertinent events    Significant Hospital Events: Including procedures, antibiotic start and stop dates in addition to other pertinent events   03/17: Pt admitted to ICU with urosepsis requiring vasopressors and acute on chronic hypoxic respiratory failure secondary to mucous plugging requiring exchange of cuffless trach to size 6 mm cuffed trach to be placed on mechanical ventilation  03/17: Right femoral central lineplaced by ER physician  03/17: CT Abd Pelvis findings with cystitis. Correlation with urinalysis is Recommended. Mild to moderate severity bibasilar atelectasis and/or Infiltrate. Sigmoid diverticulosis. Sacral decubitus ulcer, as described above. MRI correlation is recommended, as sequelae  associated with acute osteomyelitis cannot be excluded. Aortic atherosclerosis. 03/17: CT Head revealed mild chronic ischemic white matter disease. No acute intracranial abnormality seen 03/17: Vancomycin x1 dose and Cefepime x1 dose 03/17: Zosyn>>completed 03/27: On Cefepime 3/30severe bradycardia with decrease in PS mode 3/31 patient with increased WOB placed on full vent support 4/1 remains on vent,+pneumonia, failure to wean from vent 4/2 severe resp distress, on full vent support 4/4 patient failed another SBT he became bradycardic unresponsive still not able to communicate with providers wife is at the bedside and 2 daughters are still deciding about how to proceed with care 4/5 wife and kids are by the bedside long discussion about prognosis they are still deciding what should happen. 4/6 had a long discussion yesterday with the wife 2 daughters who are nurses in the room wife expressed thin desire for making him in comfort care in the medical meaning of comfort care due to her severe still not decided they needed some more time to decide which is the long plan forward was supposed to meet again today. Micro 12/07/2020 blood culture has methicillin-resistant staph aureus, Enterobacter cloacae and Enterococcus faecalis  12/13/2020 blood culture: No growth so far  12/08/2020  bronchoalveolar lavage more than 100,000 colonies of Pseudomonas aeruginosa Methicillin-resistant staph aureus 40,000 colonies Enterococcus faecalis 20,000.  12/07/2020 urine culture Enterobacter cloacae   Sacral wound has rare Pseudomonas aeruginosa rare methicillin-resistant staph aureus few Bacteroides and rare Candida albicans. Likely stool contamination  Anti-infectives (From admission, onward)   Start     Dose/Rate Route Frequency Ordered Stop   12/26/20 1200  vancomycin (VANCOREADY) IVPB 1000 mg/200 mL        1,000 mg 200 mL/hr  over 60 Minutes Intravenous Every 24 hours 12/26/20 1044     12/26/20  1100  vancomycin (VANCOREADY) IVPB 750 mg/150 mL  Status:  Discontinued        750 mg 150 mL/hr over 60 Minutes Intravenous Every 24 hours 12/26/20 0947 12/26/20 1000   12/26/20 1100  vancomycin (VANCOCIN) 750 mg in sodium chloride 0.9 % 250 mL IVPB  Status:  Discontinued        750 mg 250 mL/hr over 60 Minutes Intravenous Every 24 hours 12/26/20 1001 12/26/20 1044   12/25/20 1339  vancomycin variable dose per unstable renal function (pharmacist dosing)  Status:  Discontinued         Does not apply See admin instructions 12/25/20 1339 12/26/20 0947   12/20/20 2000  vancomycin (VANCOREADY) IVPB 500 mg/100 mL  Status:  Discontinued        500 mg 100 mL/hr over 60 Minutes Intravenous Every 12 hours 12/20/20 1709 12/25/20 1339   12/17/20 1400  vancomycin (VANCOREADY) IVPB 750 mg/150 mL  Status:  Discontinued        750 mg 150 mL/hr over 60 Minutes Intravenous Every 12 hours 12/17/20 1159 12/20/20 0604   12/13/20 1000  vancomycin (VANCOREADY) IVPB 1000 mg/200 mL  Status:  Discontinued        1,000 mg 200 mL/hr over 60 Minutes Intravenous Every 12 hours 12/13/20 0832 12/17/20 1159   12/12/20 1800  ciprofloxacin (CIPRO) IVPB 400 mg  Status:  Discontinued        400 mg 200 mL/hr over 60 Minutes Intravenous Every 8 hours 12/12/20 1620 12/21/20 1529   12/12/20 1700  ciprofloxacin (CIPRO) IVPB 400 mg  Status:  Discontinued        400 mg 200 mL/hr over 60 Minutes Intravenous Every 12 hours 12/12/20 1609 12/12/20 1620   12/12/20 1615  vancomycin variable dose per unstable renal function (pharmacist dosing)  Status:  Discontinued         Does not apply See admin instructions 12/12/20 1615 12/13/20 1050   12/12/20 1100  vancomycin (VANCOREADY) IVPB 1750 mg/350 mL        1,750 mg 175 mL/hr over 120 Minutes Intravenous  Once 12/12/20 0957 12/12/20 1301   12/12/20 0600  ceFEPIme (MAXIPIME) 2 g in sodium chloride 0.9 % 100 mL IVPB        2 g 200 mL/hr over 30 Minutes Intravenous Every 8 hours 12/11/20  1929     12/11/20 1522  vancomycin variable dose per unstable renal function (pharmacist dosing)  Status:  Discontinued         Does not apply See admin instructions 12/11/20 1522 12/12/20 1607   12/11/20 0900  vancomycin (VANCOREADY) IVPB 1000 mg/200 mL  Status:  Discontinued        1,000 mg 200 mL/hr over 60 Minutes Intravenous Every 12 hours 12/11/20 0813 12/11/20 1522   12/10/20 1515  meropenem (MERREM) 1 g in sodium chloride 0.9 % 100 mL IVPB        1 g 200 mL/hr over 30 Minutes Intravenous Every 8 hours 12/10/20 1418 12/12/20 0834   12/09/20 1000  ceFEPIme (MAXIPIME) 2 g in sodium chloride 0.9 % 100 mL IVPB  Status:  Discontinued        2 g 200 mL/hr over 30 Minutes Intravenous Every 8 hours 12/09/20 0811 12/10/20 1418   12/09/20 0730  vancomycin (VANCOREADY) IVPB 2000 mg/400 mL        2,000 mg 200 mL/hr over 120 Minutes Intravenous  Once 12/09/20 0644 12/09/20 1616   12/09/20 0600  vancomycin (VANCOREADY) IVPB 1750 mg/350 mL  Status:  Discontinued        1,750 mg 175 mL/hr over 120 Minutes Intravenous Every 24 hours 12/08/20 0809 12/08/20 1229   12/08/20 2200  ceFEPIme (MAXIPIME) 2 g in sodium chloride 0.9 % 100 mL IVPB  Status:  Discontinued        2 g 200 mL/hr over 30 Minutes Intravenous Every 12 hours 12/08/20 0809 12/09/20 0811   12/08/20 1700  vancomycin (VANCOREADY) IVPB 1500 mg/300 mL  Status:  Discontinued        1,500 mg 150 mL/hr over 120 Minutes Intravenous Every 12 hours 12/08/20 0500 12/08/20 0809   12/08/20 1230  vancomycin variable dose per unstable renal function (pharmacist dosing)  Status:  Discontinued         Does not apply See admin instructions 12/08/20 1230 12/11/20 1359   12/08/20 0545  ceFEPIme (MAXIPIME) 2 g in sodium chloride 0.9 % 100 mL IVPB  Status:  Discontinued        2 g 200 mL/hr over 30 Minutes Intravenous Every 8 hours 12/08/20 0446 12/08/20 0809   12/08/20 0530  vancomycin (VANCOREADY) IVPB 2000 mg/400 mL       "Followed by" Linked Group  Details   2,000 mg 200 mL/hr over 120 Minutes Intravenous  Once 12/08/20 0441 12/08/20 0815   12/08/20 0530  vancomycin (VANCOREADY) IVPB 500 mg/100 mL  Status:  Discontinued       "Followed by" Linked Group Details   500 mg 100 mL/hr over 60 Minutes Intravenous  Once 12/08/20 0441 12/08/20 0803   12/07/20 2215  piperacillin-tazobactam (ZOSYN) IVPB 3.375 g  Status:  Discontinued        3.375 g 12.5 mL/hr over 240 Minutes Intravenous Every 8 hours 12/07/20 2124 12/08/20 0447   12/07/20 1800  ceFEPIme (MAXIPIME) 2 g in sodium chloride 0.9 % 100 mL IVPB        2 g 200 mL/hr over 30 Minutes Intravenous  Once 12/07/20 1757 12/07/20 2036   12/07/20 1800  vancomycin (VANCOCIN) IVPB 1000 mg/200 mL premix        1,000 mg 200 mL/hr over 60 Minutes Intravenous  Once 12/07/20 1757 12/07/20 1942         Interim History / Subjective:  Severe resp failure Remains critically ill Failure to wean from vent Remains on full vent support      Objective   Blood pressure 104/62, pulse 64, temperature 97.8 F (36.6 C), temperature source Oral, resp. rate (!) 23, height 6' 5.01" (1.956 m), weight 118.1 kg, SpO2 100 %.    Vent Mode: PRVC FiO2 (%):  [40 %-50 %] 40 % Set Rate:  [18 bmp] 18 bmp Vt Set:  [500 mL] 500 mL PEEP:  [5 cmH20] 5 cmH20 Plateau Pressure:  [16 cmH20] 16 cmH20   Intake/Output Summary (Last 24 hours) at 12/27/2020 0854 Last data filed at 12/27/2020 0600 Gross per 24 hour  Intake 3631.43 ml  Output 2670 ml  Net 961.43 ml   Filed Weights   12/25/20 0253 12/26/20 0500 12/27/20 0400  Weight: 116 kg 119.3 kg 118.1 kg      REVIEW OF SYSTEMS  PATIENT IS UNABLE TO PROVIDE COMPLETE REVIEW OF SYSTEMS DUE TO SEVERE CRITICAL ILLNESS AND TOXIC METABOLIC ENCEPHALOPATHY   PHYSICAL EXAMINATION:  GENERAL:critically ill appearing, +resp distress HEAD: Normocephalic, atraumatic.  EYES: Pupils equal, round, reactive to light.  No scleral icterus.  MOUTH: Moist mucosal  membrane. NECK: Supple. No thyromegaly. No nodules. No JVD.  PULMONARY: +rhonchi, +wheezing CARDIOVASCULAR: S1 and S2. Regular rate and rhythm. No murmurs, rubs, or gallops.  GASTROINTESTINAL: Soft, nontender, -distended. Positive bowel sounds.  MUSCULOSKELETAL: No swelling, clubbing, or edema.  NEUROLOGIC: obtunded SKIN:intact,warm,dry   Labs/imaging that I havepersonally reviewed  (right click and "Reselect all SmartList Selections" daily)    Resolved Hospital Problem list   NA   ASSESSMENT AND PLAN SYNOPSIS   70 yo quadriplegic with multiple mecial issues with acute hypoxic resp failure due to severe polymicrobial sepsis and septic shockfrom pseudomonal infections of lungs and sacral decub  Failure to wean from vent   Severe ACUTE Hypoxic and Hypercapnic Respiratory Failure -continue Mechanical Ventilator support -continue Bronchodilator Therapy -Wean Fio2 and PEEP as tolerated -VAP/VENT bundle implementation -Patient is not able to tolerate SAT SBT now and continue supportive care continuous discussions with the family and consideration of LTAC to consider long weaning    CARDIAC FAILURE-acute diastolic dysfunction -oxygen as needed -Lasix as tolerated -follow up cardiac enzymes as indicated   CARDIAC ICU monitoring   ACUTE KIDNEY INJURY/Renal Failure -continue Foley Catheter-assess need -Avoid nephrotoxic agents -Follow urine output, BMP -Ensure adequate renal perfusion, optimize oxygenation -Renal dose medications  Intake/Output Summary (Last 24 hours) at 12/27/2020 0854 Last data filed at 12/27/2020 0600 Gross per 24 hour  Intake 3631.43 ml  Output 2670 ml  Net 961.43 ml     NEUROLOGY Acute toxic metabolic encephalopathy Sedation as needed   SEPTIC SHOCK -use vasopressors to keep MAP>65 as needed   INFECTIOUS DISEASE -continue antibiotics as prescribed -follow up cultures -follow up ID consultation  ENDO - ICU hypoglycemic\Hyperglycemia  protocol -check FSBS per protocol   GI GI PROPHYLAXIS as indicated  NUTRITIONAL STATUS DIET-->TF's as tolerated Constipation protocol as indicated   ELECTROLYTES -follow labs as needed -replace as needed -pharmacy consultation and following      Best practice (evaluated daily)  Diet: NPO is on tube feed Pain/Anxiety/Delirium protocol (if indicated): Yes (RASS goal 0) VAP protocol (if indicated): Yes DVT prophylaxis: Systemic AC GI prophylaxis: H2B Glucose control: SSI No Central venous access: Yes, and it is still needed PICC line right upper extremity Arterial line: N/A Foley: Yes, and it is still needed Mobility: bed rest PT consulted: N/A Last date of multidisciplinary goals of care discussion[N/A] Code Status: full code Disposition:ICU     Labs   CBC: Recent Labs  Lab 12/23/20 0421 12/24/20 0416 12/25/20 0500 12/26/20 0454 12/27/20 0502  WBC 7.7 9.7 8.5 7.0 6.2  NEUTROABS  --   --  5.7 4.4  --   HGB 7.8* 8.0* 8.2* 8.2* 8.2*  HCT 25.2* 26.0* 26.6* 25.8* 26.4*  MCV 91.6 93.5 94.3 93.1 94.3  PLT 331 316 318 288 244    Basic Metabolic Panel: Recent Labs  Lab 12/23/20 0421 12/24/20 0416 12/25/20 0500 12/26/20 0454 12/27/20 0502  NA 146* 148* 151* 150* 147*  K 3.8 3.2* 3.5 3.6 3.8  CL 108 108 112* 110 112*  CO2 31 34* 33* 33* 32  GLUCOSE 124* 149* 159* 138* 136*  BUN 45* 51* 60* 55* 56*  CREATININE 0.63 0.76 0.76 0.77 0.80  CALCIUM 9.7 10.0 10.1 10.2 10.3  MG 2.2 2.5* 2.5* 2.5* 2.6*  PHOS 2.8 3.4 3.0 2.9 3.0   GFR: Estimated Creatinine Clearance: 125.9 mL/min (by C-G formula based on SCr of 0.8 mg/dL). Recent Labs  Lab 12/24/20 0416 12/25/20 0500 12/26/20 0454 12/27/20 0502  WBC 9.7 8.5 7.0 6.2    Liver Function Tests: Recent Labs  Lab 12/26/20 0454  AST 23  ALT 23  ALKPHOS 126  BILITOT 0.7  PROT 7.4  ALBUMIN 2.6*   No results for input(s): LIPASE, AMYLASE in the last 168 hours. No results for input(s):  AMMONIA in the last 168 hours.  ABG    Component Value Date/Time   PHART 7.45 12/26/2020 0447   PCO2ART 52 (H) 12/26/2020 0447   PO2ART 83 12/26/2020 0447   HCO3 36.1 (H) 12/26/2020 0447   ACIDBASEDEF 0.4 12/08/2020 0500   O2SAT 96.6 12/26/2020 0447     Coagulation Profile: No results for input(s): INR, PROTIME in the last 168 hours.  Cardiac Enzymes: No results for input(s): CKTOTAL, CKMB, CKMBINDEX, TROPONINI in the last 168 hours.  HbA1C: Hgb A1c MFr Bld  Date/Time Value Ref Range Status  06/09/2020 07:00 AM 5.8 (H) 4.8 - 5.6 % Final    Comment:    (NOTE) Pre diabetes:          5.7%-6.4%  Diabetes:              >6.4%  Glycemic control for   <7.0% adults with diabetes     CBG: Recent Labs  Lab 12/26/20 1606 12/26/20 1923 12/26/20 2335 12/27/20 0345 12/27/20 0749  GLUCAP 118* 163* 132* 156* 132*    Allergies Allergies  Allergen Reactions  . Shrimp (Diagnostic)     Experiences facial droop when eating seafood       DVT/GI PRX  assessed I Assessed the need for Labs I Assessed the need for Foley I Assessed the need for Central Venous Line Family Discussion when available I Assessed the need for Mobilization I made an Assessment of medications to be adjusted accordingly Safety Risk assessment completed  CASE DISCUSSED IN MULTIDISCIPLINARY ROUNDS WITH ICU TEAM   Continuous family discussions family still not decided LTAC process is being held as the wife like to proceed with comfort care when the rest of the family members get on board.    Overall, patient is critically ill, prognosis is guarded.  Patient with Multiorgan failure and at high risk for cardiac arrest and death.   Very poor chance of meaningful recovery Consideration of LTAC.

## 2020-12-27 NOTE — Progress Notes (Signed)
Later in the day I spoke with the wife [name] and she expressed her desire to make her husband as a DNR and continue discussions about goals of care.

## 2020-12-27 NOTE — Progress Notes (Signed)
Spoke with wife upon her arrival today. She shared how difficult it was to make a decision. She has done so and awaiting a chance to speak with medical staff. She was extremely apologetic for her family member and his behavior. She also stated she is the boss, and showed a great strength and resolve this morning.

## 2020-12-28 LAB — BLOOD GAS, ARTERIAL
Acid-base deficit: 0.1 mmol/L (ref 0.0–2.0)
Bicarbonate: 28.5 mmol/L — ABNORMAL HIGH (ref 20.0–28.0)
FIO2: 1
MECHVT: 500 mL
Mechanical Rate: 20
O2 Saturation: 99.8 %
PEEP: 8 cmH2O
Patient temperature: 37
RATE: 20 resp/min
pCO2 arterial: 65 mmHg — ABNORMAL HIGH (ref 32.0–48.0)
pH, Arterial: 7.25 — ABNORMAL LOW (ref 7.350–7.450)
pO2, Arterial: 253 mmHg — ABNORMAL HIGH (ref 83.0–108.0)

## 2020-12-28 LAB — CBC WITH DIFFERENTIAL/PLATELET
Abs Immature Granulocytes: 0.02 10*3/uL (ref 0.00–0.07)
Basophils Absolute: 0 10*3/uL (ref 0.0–0.1)
Basophils Relative: 0 %
Eosinophils Absolute: 0.4 10*3/uL (ref 0.0–0.5)
Eosinophils Relative: 6 %
HCT: 25.4 % — ABNORMAL LOW (ref 39.0–52.0)
Hemoglobin: 7.9 g/dL — ABNORMAL LOW (ref 13.0–17.0)
Immature Granulocytes: 0 %
Lymphocytes Relative: 15 %
Lymphs Abs: 1 10*3/uL (ref 0.7–4.0)
MCH: 28.7 pg (ref 26.0–34.0)
MCHC: 31.1 g/dL (ref 30.0–36.0)
MCV: 92.4 fL (ref 80.0–100.0)
Monocytes Absolute: 1.1 10*3/uL — ABNORMAL HIGH (ref 0.1–1.0)
Monocytes Relative: 16 %
Neutro Abs: 4.1 10*3/uL (ref 1.7–7.7)
Neutrophils Relative %: 63 %
Platelets: 252 10*3/uL (ref 150–400)
RBC: 2.75 MIL/uL — ABNORMAL LOW (ref 4.22–5.81)
RDW: 16.5 % — ABNORMAL HIGH (ref 11.5–15.5)
WBC: 6.5 10*3/uL (ref 4.0–10.5)
nRBC: 0 % (ref 0.0–0.2)

## 2020-12-28 LAB — COMPREHENSIVE METABOLIC PANEL
ALT: 30 U/L (ref 0–44)
AST: 24 U/L (ref 15–41)
Albumin: 2.4 g/dL — ABNORMAL LOW (ref 3.5–5.0)
Alkaline Phosphatase: 130 U/L — ABNORMAL HIGH (ref 38–126)
Anion gap: 7 (ref 5–15)
BUN: 54 mg/dL — ABNORMAL HIGH (ref 8–23)
CO2: 29 mmol/L (ref 22–32)
Calcium: 10.2 mg/dL (ref 8.9–10.3)
Chloride: 111 mmol/L (ref 98–111)
Creatinine, Ser: 0.71 mg/dL (ref 0.61–1.24)
GFR, Estimated: >60 mL/min (ref >60)
Glucose, Bld: 124 mg/dL — ABNORMAL HIGH (ref 70–99)
Potassium: 3.8 mmol/L (ref 3.5–5.1)
Sodium: 147 mmol/L — ABNORMAL HIGH (ref 135–145)
Total Bilirubin: 0.7 mg/dL (ref 0.3–1.2)
Total Protein: 7.2 g/dL (ref 6.5–8.1)

## 2020-12-28 LAB — GLUCOSE, CAPILLARY
Glucose-Capillary: 104 mg/dL — ABNORMAL HIGH (ref 70–99)
Glucose-Capillary: 112 mg/dL — ABNORMAL HIGH (ref 70–99)
Glucose-Capillary: 117 mg/dL — ABNORMAL HIGH (ref 70–99)
Glucose-Capillary: 120 mg/dL — ABNORMAL HIGH (ref 70–99)
Glucose-Capillary: 123 mg/dL — ABNORMAL HIGH (ref 70–99)
Glucose-Capillary: 128 mg/dL — ABNORMAL HIGH (ref 70–99)
Glucose-Capillary: 137 mg/dL — ABNORMAL HIGH (ref 70–99)

## 2020-12-28 LAB — PHOSPHORUS: Phosphorus: 2.6 mg/dL (ref 2.5–4.6)

## 2020-12-28 LAB — TRIGLYCERIDES: Triglycerides: 75 mg/dL (ref <150)

## 2020-12-28 LAB — MAGNESIUM: Magnesium: 2.4 mg/dL (ref 1.7–2.4)

## 2020-12-28 MED ORDER — MORPHINE SULFATE (PF) 2 MG/ML IV SOLN
2.0000 mg | INTRAVENOUS | Status: DC | PRN
  Administered 2020-12-28 – 2020-12-30 (×3): 2 mg via INTRAVENOUS
  Filled 2020-12-28 (×3): qty 1

## 2020-12-28 MED ORDER — POTASSIUM CHLORIDE 20 MEQ PO PACK
40.0000 meq | PACK | Freq: Once | ORAL | Status: AC
  Administered 2020-12-28: 40 meq
  Filled 2020-12-28: qty 2

## 2020-12-28 NOTE — Plan of Care (Addendum)
PMT note:  Patient is resting in bed on ventilator. Spoke with CCM. Palliative consult to be cancelled as wife does not wish to discuss [place]. Please reconsult if needed.

## 2020-12-28 NOTE — Consult Note (Signed)
[facility] for Electrolyte Monitoring and Replacement   Recent Labs: Potassium (mmol/L)  Date Value  12/28/2020 3.8   Magnesium (mg/dL)  Date Value  12/28/2020 2.4   Calcium (mg/dL)  Date Value  12/28/2020 10.2   Albumin (g/dL)  Date Value  12/28/2020 2.4 (L)  02/01/2020 4.4   Phosphorus (mg/dL)  Date Value  12/28/2020 2.6   Sodium (mmol/L)  Date Value  12/28/2020 147 (H)  02/01/2020 143    Assessment: Patient is a 70 y/o M presented with sepsis. Pt presented with complaints of recurrent UTI and hematuria. 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 Pt has chronic tracheostomy and PEG tube. PMH includes HTN and HLD. Pharmacy was consulted to assist with electrolyte monitoring and replacement as indicated. He is being followed by cardiology and had noted episodes of bradycardia, PSVT.  Nutrition: tube feeds @ 75 mL/hr + Juven 1 packet BID   Goal of Therapy:  Potassium 4.0 - 5.1 mmol/L Magnesium 2.0 - 2.4 mg/dL All Other Electrolytes WNL  Plan:   Na 147 (improving) - continue free water 300 mL per tube q2h (3600 mL/day)  K 3.8 - ordered 40 mEq KCl per tube x 1   Follow-up electrolytes with AM labs  Benn Moulder, PharmD Pharmacy Resident  12/28/2020 7:04 AM

## 2020-12-28 NOTE — Progress Notes (Signed)
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 According to pts family last week pt noted to have sediment in his urine at home concerning for UTI. He had a foley catheter placed 2 weeks ago and completed a 7 day course of abx therapy due to UTI. Due to concerns of recurrent UTI pts family contacted pts PCP office last week, and heard from his PCP this week and were awaiting urine culture results. However, pt noted to have hematuria yesterday and today along with sinus tachycardia hr 120's. Pts daughter is a Marine scientist and due to concern of sepsis pt transported to the ER via EMS for further evaluation and treatment.    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 C5 Cervical Fracture (following a mechanical fall at home) s/p C3-6 Laminectomy and Posterior Instrumentation and Fusion 08/4  Quadriplegia  Spinal Cord Compression  Chronic Tracheostomy and PEG  Significant Hospital Events: Including procedures, antibiotic start and stop dates in addition to other pertinent events   Significant Hospital Events: Including procedures, antibiotic start and stop dates in addition to other pertinent events   03/17: Pt admitted to ICU with urosepsis requiring vasopressors and acute on chronic hypoxic respiratory failure secondary to mucous plugging requiring exchange of cuffless trach to size 6 mm cuffed trach to be placed on mechanical ventilation  03/17: Right femoral central lineplaced by ER physician  03/17: CT Abd Pelvis findings with cystitis. Correlation with urinalysis is Recommended. Mild to moderate severity bibasilar atelectasis and/or Infiltrate. Sigmoid diverticulosis. Sacral decubitus ulcer, as described above. MRI correlation is recommended, as sequelae  associated with acute osteomyelitis cannot be excluded. Aortic atherosclerosis. 03/17: CT Head revealed mild chronic ischemic white matter disease. No acute intracranial abnormality seen 03/17: Vancomycin x1 dose and Cefepime x1 dose 03/17: Zosyn>>completed 03/27: On Cefepime 3/30severe bradycardia with decrease in PS mode 3/31 patient with increased WOB placed on full vent support 4/1 remains on vent,+pneumonia, failure to wean from vent 4/2 severe resp distress, on full vent support 4/4 patient failed another SBT he became bradycardic unresponsive still not able to communicate with providers wife is at the bedside and 2 daughters are still deciding about how to proceed with care 4/5 wife and kids are by the bedside long discussion about prognosis they are still deciding what should happen. 4/6 had a long discussion yesterday with the wife 2 daughters who are nurses in the room wife expressed thin desire for making him in comfort care in the medical meaning of comfort care due to her severe still not decided they needed some more time to decide which is the long plan forward was supposed to meet again today.  Micro 12/07/2020 blood culture has methicillin-resistant staph aureus, Enterobacter cloacae and Enterococcus faecalis  12/13/2020 blood culture: No growth so far  12/08/2020  bronchoalveolar lavage more than 100,000 colonies of Pseudomonas aeruginosa Methicillin-resistant staph aureus 40,000 colonies Enterococcus faecalis 20,000.  12/07/2020 urine culture Enterobacter cloacae   Sacral wound has rare Pseudomonas aeruginosa rare methicillin-resistant staph aureus few Bacteroides and rare Candida albicans. Likely stool contamination   Anti-infectives (From admission, onward)   Start     Dose/Rate Route Frequency Ordered Stop   12/26/20 1200  vancomycin (VANCOREADY) IVPB 1000 mg/200 mL        1,000 mg 200  mL/hr over 60 Minutes Intravenous Every 24 hours 12/26/20 1044      12/26/20 1100  vancomycin (VANCOREADY) IVPB 750 mg/150 mL  Status:  Discontinued        750 mg 150 mL/hr over 60 Minutes Intravenous Every 24 hours 12/26/20 0947 12/26/20 1000   12/26/20 1100  vancomycin (VANCOCIN) 750 mg in sodium chloride 0.9 % 250 mL IVPB  Status:  Discontinued        750 mg 250 mL/hr over 60 Minutes Intravenous Every 24 hours 12/26/20 1001 12/26/20 1044   12/25/20 1339  vancomycin variable dose per unstable renal function (pharmacist dosing)  Status:  Discontinued         Does not apply See admin instructions 12/25/20 1339 12/26/20 0947   12/20/20 2000  vancomycin (VANCOREADY) IVPB 500 mg/100 mL  Status:  Discontinued        500 mg 100 mL/hr over 60 Minutes Intravenous Every 12 hours 12/20/20 1709 12/25/20 1339   12/17/20 1400  vancomycin (VANCOREADY) IVPB 750 mg/150 mL  Status:  Discontinued        750 mg 150 mL/hr over 60 Minutes Intravenous Every 12 hours 12/17/20 1159 12/20/20 0604   12/13/20 1000  vancomycin (VANCOREADY) IVPB 1000 mg/200 mL  Status:  Discontinued        1,000 mg 200 mL/hr over 60 Minutes Intravenous Every 12 hours 12/13/20 0832 12/17/20 1159   12/12/20 1800  ciprofloxacin (CIPRO) IVPB 400 mg  Status:  Discontinued        400 mg 200 mL/hr over 60 Minutes Intravenous Every 8 hours 12/12/20 1620 12/21/20 1529   12/12/20 1700  ciprofloxacin (CIPRO) IVPB 400 mg  Status:  Discontinued        400 mg 200 mL/hr over 60 Minutes Intravenous Every 12 hours 12/12/20 1609 12/12/20 1620   12/12/20 1615  vancomycin variable dose per unstable renal function (pharmacist dosing)  Status:  Discontinued         Does not apply See admin instructions 12/12/20 1615 12/13/20 1050   12/12/20 1100  vancomycin (VANCOREADY) IVPB 1750 mg/350 mL        1,750 mg 175 mL/hr over 120 Minutes Intravenous  Once 12/12/20 0957 12/12/20 1301   12/12/20 0600  ceFEPIme (MAXIPIME) 2 g in sodium chloride 0.9 % 100 mL IVPB        2 g 200 mL/hr over 30 Minutes Intravenous Every 8 hours  12/11/20 1929     12/11/20 1522  vancomycin variable dose per unstable renal function (pharmacist dosing)  Status:  Discontinued         Does not apply See admin instructions 12/11/20 1522 12/12/20 1607   12/11/20 0900  vancomycin (VANCOREADY) IVPB 1000 mg/200 mL  Status:  Discontinued        1,000 mg 200 mL/hr over 60 Minutes Intravenous Every 12 hours 12/11/20 0813 12/11/20 1522   12/10/20 1515  meropenem (MERREM) 1 g in sodium chloride 0.9 % 100 mL IVPB        1 g 200 mL/hr over 30 Minutes Intravenous Every 8 hours 12/10/20 1418 12/12/20 0834   12/09/20 1000  ceFEPIme (MAXIPIME) 2 g in sodium chloride 0.9 % 100 mL IVPB  Status:  Discontinued        2 g 200 mL/hr over 30 Minutes Intravenous Every 8 hours 12/09/20 0811 12/10/20 1418   12/09/20 0730  vancomycin (VANCOREADY) IVPB 2000 mg/400 mL        2,000 mg 200 mL/hr over 120 Minutes  Intravenous  Once 12/09/20 0644 12/09/20 1616   12/09/20 0600  vancomycin (VANCOREADY) IVPB 1750 mg/350 mL  Status:  Discontinued        1,750 mg 175 mL/hr over 120 Minutes Intravenous Every 24 hours 12/08/20 0809 12/08/20 1229   12/08/20 2200  ceFEPIme (MAXIPIME) 2 g in sodium chloride 0.9 % 100 mL IVPB  Status:  Discontinued        2 g 200 mL/hr over 30 Minutes Intravenous Every 12 hours 12/08/20 0809 12/09/20 0811   12/08/20 1700  vancomycin (VANCOREADY) IVPB 1500 mg/300 mL  Status:  Discontinued        1,500 mg 150 mL/hr over 120 Minutes Intravenous Every 12 hours 12/08/20 0500 12/08/20 0809   12/08/20 1230  vancomycin variable dose per unstable renal function (pharmacist dosing)  Status:  Discontinued         Does not apply See admin instructions 12/08/20 1230 12/11/20 1359   12/08/20 0545  ceFEPIme (MAXIPIME) 2 g in sodium chloride 0.9 % 100 mL IVPB  Status:  Discontinued        2 g 200 mL/hr over 30 Minutes Intravenous Every 8 hours 12/08/20 0446 12/08/20 0809   12/08/20 0530  vancomycin (VANCOREADY) IVPB 2000 mg/400 mL       "Followed by" Linked  Group Details   2,000 mg 200 mL/hr over 120 Minutes Intravenous  Once 12/08/20 0441 12/08/20 0815   12/08/20 0530  vancomycin (VANCOREADY) IVPB 500 mg/100 mL  Status:  Discontinued       "Followed by" Linked Group Details   500 mg 100 mL/hr over 60 Minutes Intravenous  Once 12/08/20 0441 12/08/20 0803   12/07/20 2215  piperacillin-tazobactam (ZOSYN) IVPB 3.375 g  Status:  Discontinued        3.375 g 12.5 mL/hr over 240 Minutes Intravenous Every 8 hours 12/07/20 2124 12/08/20 0447   12/07/20 1800  ceFEPIme (MAXIPIME) 2 g in sodium chloride 0.9 % 100 mL IVPB        2 g 200 mL/hr over 30 Minutes Intravenous  Once 12/07/20 1757 12/07/20 2036   12/07/20 1800  vancomycin (VANCOCIN) IVPB 1000 mg/200 mL premix        1,000 mg 200 mL/hr over 60 Minutes Intravenous  Once 12/07/20 1757 12/07/20 1942        Interim History / Subjective:  Remains critically ill Full vent support Failure to wean from vent       Objective   Blood pressure 104/61, pulse 63, temperature 98.6 F (37 C), temperature source Axillary, resp. rate (!) 25, height 6' 5.01" (1.956 m), weight 120.4 kg, SpO2 98 %.    Vent Mode: PRVC FiO2 (%):  [40 %] 40 % Set Rate:  [18 bmp] 18 bmp Vt Set:  [500 mL] 500 mL PEEP:  [5 cmH20] 5 cmH20 Plateau Pressure:  [16 cmH20] 16 cmH20   Intake/Output Summary (Last 24 hours) at 12/28/2020 0724 Last data filed at 12/28/2020 0542 Gross per 24 hour  Intake 632.26 ml  Output 3625 ml  Net -2992.74 ml   Filed Weights   12/26/20 0500 12/27/20 0400 12/28/20 0316  Weight: 119.3 kg 118.1 kg 120.4 kg      REVIEW OF SYSTEMS  PATIENT IS UNABLE TO PROVIDE COMPLETE REVIEW OF SYSTEMS DUE TO SEVERE CRITICAL ILLNESS AND TOXIC METABOLIC ENCEPHALOPATHY   PHYSICAL EXAMINATION:  GENERAL:critically ill appearing, +resp distress HEAD: Normocephalic, atraumatic.  EYES: Pupils equal, round, reactive to light.  No scleral icterus.  MOUTH: Moist mucosal membrane.  NECK: Supple. No thyromegaly.  No nodules. No JVD.  PULMONARY: +rhonchi, +wheezing CARDIOVASCULAR: S1 and S2. Regular rate and rhythm. No murmurs, rubs, or gallops.  GASTROINTESTINAL: Soft, nontender, -distended. Positive bowel sounds.  MUSCULOSKELETAL: No swelling, clubbing, or edema.  NEUROLOGIC: obtunded SKIN:intact,warm,dry   Labs/imaging that I havepersonally reviewed  (right click and "Reselect all SmartList Selections" daily)      ASSESSMENT AND PLAN SYNOPSIS   70 yo quadriplegic with multiple mecial issues with acute hypoxic resp failure due to severe polymicrobial sepsis and septic shockfrom pseudomonal infections of lungs and sacral decub  Failure to wean from vent  Severe ACUTE Hypoxic and Hypercapnic Respiratory Failure -continue Mechanical Ventilator support -continue Bronchodilator Therapy -Wean Fio2 and PEEP as tolerated -VAP/VENT bundle implementation Patient is failure to wean from vent  Vent Mode: PRVC FiO2 (%):  [40 %] 40 % Set Rate:  [18 bmp] 18 bmp Vt Set:  [500 mL] 500 mL PEEP:  [5 cmH20] 5 cmH20 Plateau Pressure:  [16 cmH20] 16 cmH20  CARDIAC FAILURE-acute diastolic dysfunction -oxygen as needed -Lasix as tolerated -follow up cardiac enzymes as indicated   CARDIAC ICU monitoring   ACUTE KIDNEY INJURY/Renal Failure -continue Foley Catheter-assess need -Avoid nephrotoxic agents -Follow urine output, BMP -Ensure adequate renal perfusion, optimize oxygenation -Renal dose medications  Intake/Output Summary (Last 24 hours) at 12/28/2020 0726 Last data filed at 12/28/2020 0542 Gross per 24 hour  Intake 632.26 ml  Output 3625 ml  Net -2992.74 ml     NEUROLOGY Acute toxic metabolic encephalopathy, need for sedation    SEPTIC SHOCK -use vasopressors to keep MAP>65 as needed   INFECTIOUS DISEASE -continue antibiotics as prescribed -follow up cultures -follow up ID consultation  ENDO - ICU hypoglycemic\Hyperglycemia protocol -check FSBS per protocol   GI GI  PROPHYLAXIS as indicated  NUTRITIONAL STATUS DIET-->TF's as tolerated Constipation protocol as indicated   ELECTROLYTES -follow labs as needed -replace as needed -pharmacy consultation and following     Best practice (evaluated daily)  Diet: NPO is on tube feed Pain/Anxiety/Delirium protocol (if indicated): Yes (RASS goal 0) VAP protocol (if indicated): Yes DVT prophylaxis: Systemic AC GI prophylaxis: H2B Glucose control: SSI No Central venous access: Yes, and it is still needed PICC line right upper extremity Arterial line: N/A Foley: Yes, and it is still needed Mobility: bed rest PT consulted: N/A Last date of multidisciplinary goals of care discussion[N/A] Code Status: DNR status Disposition:ICU     Labs   CBC: Recent Labs  Lab 12/24/20 0416 12/25/20 0500 12/26/20 0454 12/27/20 0502 12/28/20 0338  WBC 9.7 8.5 7.0 6.2 6.5  NEUTROABS  --  5.7 4.4  --  4.1  HGB 8.0* 8.2* 8.2* 8.2* 7.9*  HCT 26.0* 26.6* 25.8* 26.4* 25.4*  MCV 93.5 94.3 93.1 94.3 92.4  PLT 316 318 288 260 962    Basic Metabolic Panel: Recent Labs  Lab 12/24/20 0416 12/25/20 0500 12/26/20 0454 12/27/20 0502 12/28/20 0338  NA 148* 151* 150* 147* 147*  K 3.2* 3.5 3.6 3.8 3.8  CL 108 112* 110 112* 111  CO2 34* 33* 33* 32 29  GLUCOSE 149* 159* 138* 136* 124*  BUN 51* 60* 55* 56* 54*  CREATININE 0.76 0.76 0.77 0.80 0.71  CALCIUM 10.0 10.1 10.2 10.3 10.2  MG 2.5* 2.5* 2.5* 2.6* 2.4  PHOS 3.4 3.0 2.9 3.0 2.6   GFR: Estimated Creatinine Clearance: 127 mL/min (by C-G formula based on SCr of 0.71 mg/dL). Recent Labs  Lab 12/25/20 0500 12/26/20 0454 12/27/20  0502 12/28/20 0338  WBC 8.5 7.0 6.2 6.5    Liver Function Tests: Recent Labs  Lab 12/26/20 0454 12/28/20 0338  AST 23 24  ALT 23 30  ALKPHOS 126 130*  BILITOT 0.7 0.7  PROT 7.4 7.2  ALBUMIN 2.6* 2.4*   No results for input(s): LIPASE, AMYLASE in the last 168 hours. No results for input(s): AMMONIA in the  last 168 hours.  ABG    Component Value Date/Time   PHART 7.45 12/26/2020 0447   PCO2ART 52 (H) 12/26/2020 0447   PO2ART 83 12/26/2020 0447   HCO3 36.1 (H) 12/26/2020 0447   ACIDBASEDEF 0.4 12/08/2020 0500   O2SAT 96.6 12/26/2020 0447     Coagulation Profile: No results for input(s): INR, PROTIME in the last 168 hours.  Cardiac Enzymes: No results for input(s): CKTOTAL, CKMB, CKMBINDEX, TROPONINI in the last 168 hours.  HbA1C: Hgb A1c MFr Bld  Date/Time Value Ref Range Status  06/09/2020 07:00 AM 5.8 (H) 4.8 - 5.6 % Final    Comment:    (NOTE) Pre diabetes:          5.7%-6.4%  Diabetes:              >6.4%  Glycemic control for   <7.0% adults with diabetes     CBG: Recent Labs  Lab 12/27/20 1644 12/27/20 1928 12/27/20 2310 12/28/20 0308 12/28/20 0720  GLUCAP 140* 128* 124* 112* 137*    Allergies Allergies  Allergen Reactions  . Shrimp (Diagnostic)     Experiences facial droop when eating seafood       DVT/GI PRX  assessed I Assessed the need for Labs I Assessed the need for Foley I Assessed the need for Central Venous Line Family Discussion when available I Assessed the need for Mobilization I made an Assessment of medications to be adjusted accordingly Safety Risk assessment completed  CASE DISCUSSED IN MULTIDISCIPLINARY ROUNDS WITH ICU TEAM     Critical Care Time devoted to patient care services described in this note is 45 minutes.   Overall, patient is critically ill, prognosis is guarded.  Patient with Multiorgan failure and at high risk for cardiac arrest and death.   Very poor chance of meaningful recovery Corrin Parker, M.D.  [facility]  Medical Director [place] Director [place] Department

## 2020-12-28 NOTE — Progress Notes (Signed)
GOALS OF CARE DISCUSSION  The Clinical status was relayed to family in detail. Wife at Bedside  Updated and notified of patients medical condition.  Patient remains unresponsive and will not open eyes to command.    Patient is having a weak cough and struggling to remove secretions.   Patient with increased WOB and using accessory muscles to breathe Explained to family course of therapy and the modalities     Patient with Progressive multiorgan failure with a very high probablity of a very minimal chance of meaningful recovery despite all aggressive and optimal medical therapy.   Process associated with Suffering.  PATIENT REMAINS DNR STATUS  Family understands the situation.   Family are satisfied with Plan of action and management. All questions answered  Additional CC time 35 mins   Ryan Day Patricia Pesa, M.D.  [facility]  Medical Director [place] Director [facility] Department

## 2020-12-29 LAB — PHOSPHORUS: Phosphorus: 2.6 mg/dL (ref 2.5–4.6)

## 2020-12-29 LAB — GLUCOSE, CAPILLARY
Glucose-Capillary: 127 mg/dL — ABNORMAL HIGH (ref 70–99)
Glucose-Capillary: 133 mg/dL — ABNORMAL HIGH (ref 70–99)
Glucose-Capillary: 126 mg/dL — ABNORMAL HIGH (ref 70–99)
Glucose-Capillary: 112 mg/dL — ABNORMAL HIGH (ref 70–99)
Glucose-Capillary: 136 mg/dL — ABNORMAL HIGH (ref 70–99)
Glucose-Capillary: 125 mg/dL — ABNORMAL HIGH (ref 70–99)
Glucose-Capillary: 119 mg/dL — ABNORMAL HIGH (ref 70–99)

## 2020-12-29 LAB — BASIC METABOLIC PANEL
Anion gap: 6 (ref 5–15)
BUN: 49 mg/dL — ABNORMAL HIGH (ref 8–23)
CO2: 30 mmol/L (ref 22–32)
Calcium: 10.4 mg/dL — ABNORMAL HIGH (ref 8.9–10.3)
Chloride: 108 mmol/L (ref 98–111)
Creatinine, Ser: 0.7 mg/dL (ref 0.61–1.24)
GFR, Estimated: >60 mL/min (ref >60)
Glucose, Bld: 142 mg/dL — ABNORMAL HIGH (ref 70–99)
Potassium: 3.9 mmol/L (ref 3.5–5.1)
Sodium: 144 mmol/L (ref 135–145)

## 2020-12-29 LAB — CBC
HCT: 24.6 % — ABNORMAL LOW (ref 39.0–52.0)
Hemoglobin: 7.6 g/dL — ABNORMAL LOW (ref 13.0–17.0)
MCH: 28.7 pg (ref 26.0–34.0)
MCHC: 30.9 g/dL (ref 30.0–36.0)
MCV: 92.8 fL (ref 80.0–100.0)
Platelets: 224 10*3/uL (ref 150–400)
RBC: 2.65 MIL/uL — ABNORMAL LOW (ref 4.22–5.81)
RDW: 16.3 % — ABNORMAL HIGH (ref 11.5–15.5)
WBC: 6 10*3/uL (ref 4.0–10.5)
nRBC: 0 % (ref 0.0–0.2)

## 2020-12-29 LAB — VANCOMYCIN, PEAK: Vancomycin Pk: 25 ug/mL — ABNORMAL LOW (ref 30–40)

## 2020-12-29 LAB — MAGNESIUM: Magnesium: 2.3 mg/dL (ref 1.7–2.4)

## 2020-12-29 MED ORDER — POLYVINYL ALCOHOL 1.4 % OP SOLN
1.0000 [drp] | OPHTHALMIC | Status: DC | PRN
  Administered 2020-12-30 – 2021-01-05 (×3): 1 [drp] via OPHTHALMIC
  Filled 2020-12-29: qty 15

## 2020-12-29 MED ORDER — POTASSIUM CHLORIDE 20 MEQ PO PACK
20.0000 meq | PACK | Freq: Once | ORAL | Status: AC
  Administered 2020-12-29: 20 meq
  Filled 2020-12-29: qty 1

## 2020-12-29 NOTE — Progress Notes (Signed)
Pharmacy Antibiotic Note  Ryan Day is a 70 y.o. male admitted on 12/07/2020 with sepsis. Pt presented with complaints of recurrent UTI and hematuria with foley cath last placed ~2 weeks ago, exchanged in ED. 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 Pt has PEG and trache. Pt admitted 11/22-12/5 for HCAP and sacral ulcer with osteomyelitis. Hx of enterobacter cloacae, pseudomonas in ucx. Pt has received vanc, Unasyn, Zosyn, and cefepime on recent previous admissions. ID consulted. Pt with polymicrobial bacteremia with sepsis resulting from multiple sources including UTI, PNA, and sacral wound. Meropenem was discontinued 3/22 based on sensitivity to ceftazidime from BAL. IV Cipro was added due to increased secretions, now d/c'd on 3/31 following 11 days of therapy. Pharmacy was consulted for vancomycin dosing. His creatinine has improved since admission, now stable ~0.7 (baseline ~0.5). Planning to complete 4 weeks of treatment   Day 22 abx - WBC WNL, afebrile   Plan:  1) continue cefepime 2 grams IV every 8 hours    2) continue vancomycin 1000 mg IV every 24 hours  Ke: 0.026 h-1, T1/2: 26.9 h  Css (calculated): 25.7 / 14.2 mcg/mL Goal AUC 400-550  Expected AUC: 466.3 Daily SCr while on IV vancomycin to assess renal function Vanc peak ordered for 4/8 @ 1500, vanc trough ordered for 4/9 @ 1100  Height: 6' 5.01" (195.6 cm) Weight: 121.4 kg (267 lb 10.2 oz) IBW/kg (Calculated) : 89.12   Temp (24hrs), Avg:98.4 F (36.9 C), Min:96.8 F (36 C), Max:99.3 F (37.4 C)  Recent Labs  Lab 12/22/20 1147 12/22/20 2009 12/23/20 0421 12/25/20 0500 12/26/20 0454 12/27/20 0502 12/28/20 0338 12/29/20 0441  WBC  --   --    < > 8.5 7.0 6.2 6.5 6.0  CREATININE  --   --    < > 0.76 0.77 0.80 0.71 0.70  VANCOTROUGH  --  17  --   --   --   --   --   --   VANCOPEAK 21*  --   --   --   --   --   --   --   VANCORANDOM  --   --   --   --  13  --   --   --    < > =  values in this interval not displayed.    Estimated Creatinine Clearance: 127.5 mL/min (by C-G formula based on SCr of 0.7 mg/dL).    Allergies  Allergen Reactions  . Shrimp (Diagnostic)     Experiences facial droop when eating seafood    Antimicrobials this admission: 3/20 Meropenem >> 3/21  3/22 Cipro >> 3/31 3/17 Vancomycin  >>  3/17 Cefepime  >> 3/20  3/22 >>  Microbiology results: 3/17 MRSA PCR: positive 3/17 BCx: E cloacae, MRSA, E faecalis 3/17 UCx: E cloacae 3/18 Resp cx: PSA 3/18 BAL: PSA (ceftazidime sensitive), MRSA, E faecalis 3/21 Sacral cx: MRSA, PSA 3/23 BCx: NGTD 3/27 Respiratory Cx: PSA (gentamicin sensitive) - MDR PSA likely colonization - treating based on BAL susc.    Thank you for allowing pharmacy to be a part of this patient's care.  Benn Moulder, PharmD Pharmacy Resident  12/29/2020 11:12 AM

## 2020-12-29 NOTE — Progress Notes (Signed)
Patient refused to receive oral care several times. Patient was asleep and did not want to be disturbed. I woke the patient up and asked if it was okay to perform oral care. Patient mouthed the word no and shook his head. Patient did the same after being given a bath by [employer] and myself. Will continue patient care.

## 2020-12-29 NOTE — Progress Notes (Signed)
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 According to pts family last week pt noted to have sediment in his urine at home concerning for UTI. He had a foley catheter placed 2 weeks ago and completed a 7 day course of abx therapy due to UTI. Due to concerns of recurrent UTI pts family contacted pts PCP office last week, and heard from his PCP this week and were awaiting urine culture results. However, pt noted to have hematuria yesterday and today along with sinus tachycardia hr 120's. Pts daughter is a Marine scientist and due to concern of sepsis pt transported to the ER via EMS for further evaluation and treatment.    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 C5 Cervical Fracture (following a mechanical fall at home) s/p C3-6 Laminectomy and Posterior Instrumentation and Fusion 08/4  Quadriplegia  Spinal Cord Compression  Chronic Tracheostomy and PEG  Significant Hospital Events: Including procedures, antibiotic start and stop dates in addition to other pertinent events   Significant Hospital Events: Including procedures, antibiotic start and stop dates in addition to other pertinent events   03/17: Pt admitted to ICU with urosepsis requiring vasopressors and acute on chronic hypoxic respiratory failure secondary to mucous plugging requiring exchange of cuffless trach to size 6 mm cuffed trach to be placed on mechanical ventilation  03/17: Right femoral central lineplaced by ER physician  03/17: CT Abd Pelvis findings with cystitis. Correlation with urinalysis is Recommended. Mild to moderate severity bibasilar atelectasis and/or Infiltrate. Sigmoid diverticulosis. Sacral decubitus ulcer, as described above. MRI correlation is recommended, as sequelae  associated with acute osteomyelitis cannot be excluded. Aortic atherosclerosis. 03/17: CT Head revealed mild chronic ischemic white matter disease. No acute intracranial abnormality seen 03/17: Vancomycin x1 dose and Cefepime x1 dose 03/17: Zosyn>>completed 03/27: On Cefepime 3/30severe bradycardia with decrease in PS mode 3/31 patient with increased WOB placed on full vent support 4/1remains on vent,+pneumonia, failure to wean from vent 4/2 severe resp distress, on full vent support 4/4patient failed another SBT he became bradycardic unresponsive still not able to communicate with providers wife is at the bedside and 2 daughters are still deciding about how to proceed with care 4/5wife and kids are by the bedside long discussion about prognosis they are still deciding what should happen. 4/6had a long discussion yesterday with the wife 2 daughters who are nurses in the room wife expressed thin desire for making him in comfort care in the medical meaning of comfort care due to her severe still not decided they needed some more time to decide which is the long plan forward was supposed to meet again today.  Micro 12/07/2020 blood culture has methicillin-resistant staph aureus, Enterobacter cloacae and Enterococcus faecalis  12/13/2020 blood culture: No growth so far  12/08/2020  bronchoalveolar lavage more than 100,000 colonies of Pseudomonas aeruginosa Methicillin-resistant staph aureus 40,000 colonies Enterococcus faecalis 20,000.  12/07/2020 urine culture Enterobacter cloacae   Sacral wound has rare Pseudomonas aeruginosa rare methicillin-resistant staph aureus few Bacteroides and rare Candida albicans. Likely stool contamination      Interim History / Subjective:  Remains critically ill Failure to wean Severe sepsis Severe resp distress significant amount of secretions Very poor prognosis     Objective   Blood pressure (!) 97/53, pulse 66, temperature 98.9 F  (  37.2 C), temperature source Axillary, resp. rate 19, height 6' 5.01" (1.956 m), weight 121.4 kg, SpO2 97 %.    Vent Mode: PRVC FiO2 (%):  [40 %] 40 % Set Rate:  [18 bmp] 18 bmp Vt Set:  [500 mL] 500 mL PEEP:  [5 cmH20] 5 cmH20   Intake/Output Summary (Last 24 hours) at 12/29/2020 0728 Last data filed at 12/29/2020 0600 Gross per 24 hour  Intake 13907.74 ml  Output 4800 ml  Net 9107.74 ml   Filed Weights   12/27/20 0400 12/28/20 0316 12/29/20 0453  Weight: 118.1 kg 120.4 kg 121.4 kg      REVIEW OF SYSTEMS  PATIENT IS UNABLE TO PROVIDE COMPLETE REVIEW OF SYSTEMS DUE TO SEVERE CRITICAL ILLNESS AND TOXIC METABOLIC ENCEPHALOPATHY   PHYSICAL EXAMINATION:  GENERAL:critically ill appearing, +resp distress HEAD: Normocephalic, atraumatic.  EYES: Pupils equal, round, reactive to light.  No scleral icterus.  MOUTH: Moist mucosal membrane. NECK: Supple. S/p rach PULMONARY: +rhonchi, CARDIOVASCULAR: S1 and S2. Regular rate and rhythm. No murmurs, rubs, or gallops.  GASTROINTESTINAL: +PEG tube  MUSCULOSKELETAL: + edema.  NEUROLOGIC: obtunded SKIN:+sacral decub   Labs/imaging that I havepersonally reviewed  (right click and "Reselect all SmartList Selections" daily)      ASSESSMENT AND PLAN SYNOPSIS    70 yo quadriplegic with multiple mecial issues with acute hypoxic resp failure due to severe polymicrobial sepsis and septic shockfrom pseudomonal infections of lungs and sacral decub Failure to wean from vent   Severe ACUTE Hypoxic and Hypercapnic Respiratory Failure -continue Mechanical Ventilator support -continue Bronchodilator Therapy -Wean Fio2 and PEEP as tolerated -VAP/VENT bundle implementation Cannot wean from vent due to significant amount sof secretions  Vent Mode: PRVC FiO2 (%):  [40 %] 40 % Set Rate:  [18 bmp] 18 bmp Vt Set:  [500 mL] 500 mL PEEP:  [5 cmH20] 5 cmH20    CARDIAC FAILURE-acute diastolic dysfunction -oxygen as needed -Lasix as  tolerated -follow up cardiac enzymes as indicated   CARDIAC ICU monitoring   ACUTE KIDNEY INJURY/Renal Failure -continue Foley Catheter-assess need -Avoid nephrotoxic agents -Follow urine output, BMP -Ensure adequate renal perfusion, optimize oxygenation -Renal dose medications   Intake/Output Summary (Last 24 hours) at 12/29/2020 0734 Last data filed at 12/29/2020 0600 Gross per 24 hour  Intake 13907.74 ml  Output 4800 ml  Net 9107.74 ml     NEUROLOGY Acute toxic metabolic encephalopathy, need for sedation    SEPTIC SHOCK -use vasopressors to keep MAP>65 as needed   INFECTIOUS DISEASE PSEUDOMONAS infections and MRSA -continue antibiotics as prescribed -follow up cultures -follow up ID consultation  ENDO - ICU hypoglycemic\Hyperglycemia protocol -check FSBS per protocol   GI GI PROPHYLAXIS as indicated  NUTRITIONAL STATUS DIET-->TF's as tolerated Constipation protocol as indicated   ELECTROLYTES -follow labs as needed -replace as needed -pharmacy consultation and following    Best practice (evaluated daily)  Diet: NPO is on tube feed Pain/Anxiety/Delirium protocol (if indicated): Yes (RASS goal 0) VAP protocol (if indicated): Yes DVT prophylaxis: Systemic AC GI prophylaxis: H2B Glucose control: SSI No Central venous access: Yes, and it is still needed PICC line right upper extremity Arterial line: N/A Foley: Yes, and it is still needed Mobility: bed rest PT consulted: N/A Last date of multidisciplinary goals of care discussion[N/A] Code Status: DNR status Disposition:ICU     Labs   CBC: Recent Labs  Lab 12/25/20 0500 12/26/20 0454 12/27/20 0502 12/28/20 0338 12/29/20 0441  WBC 8.5 7.0 6.2 6.5 6.0  NEUTROABS 5.7 4.4  --  4.1  --   HGB 8.2* 8.2* 8.2* 7.9* 7.6*  HCT 26.6* 25.8* 26.4* 25.4* 24.6*  MCV 94.3 93.1 94.3 92.4 92.8  PLT 318 288 260 252 629    Basic Metabolic Panel: Recent Labs  Lab 12/25/20 0500  12/26/20 0454 12/27/20 0502 12/28/20 0338 12/29/20 0441  NA 151* 150* 147* 147* 144  K 3.5 3.6 3.8 3.8 3.9  CL 112* 110 112* 111 108  CO2 33* 33* 32 29 30  GLUCOSE 159* 138* 136* 124* 142*  BUN 60* 55* 56* 54* 49*  CREATININE 0.76 0.77 0.80 0.71 0.70  CALCIUM 10.1 10.2 10.3 10.2 10.4*  MG 2.5* 2.5* 2.6* 2.4 2.3  PHOS 3.0 2.9 3.0 2.6 2.6   GFR: Estimated Creatinine Clearance: 127.5 mL/min (by C-G formula based on SCr of 0.7 mg/dL). Recent Labs  Lab 12/26/20 0454 12/27/20 0502 12/28/20 0338 12/29/20 0441  WBC 7.0 6.2 6.5 6.0    Liver Function Tests: Recent Labs  Lab 12/26/20 0454 12/28/20 0338  AST 23 24  ALT 23 30  ALKPHOS 126 130*  BILITOT 0.7 0.7  PROT 7.4 7.2  ALBUMIN 2.6* 2.4*   No results for input(s): LIPASE, AMYLASE in the last 168 hours. No results for input(s): AMMONIA in the last 168 hours.  ABG    Component Value Date/Time   PHART 7.45 12/26/2020 0447   PCO2ART 52 (H) 12/26/2020 0447   PO2ART 83 12/26/2020 0447   HCO3 36.1 (H) 12/26/2020 0447   ACIDBASEDEF 0.4 12/08/2020 0500   O2SAT 96.6 12/26/2020 0447     Coagulation Profile: No results for input(s): INR, PROTIME in the last 168 hours.  Cardiac Enzymes: No results for input(s): CKTOTAL, CKMB, CKMBINDEX, TROPONINI in the last 168 hours.  HbA1C: Hgb A1c MFr Bld  Date/Time Value Ref Range Status  06/09/2020 07:00 AM 5.8 (H) 4.8 - 5.6 % Final    Comment:    (NOTE) Pre diabetes:          5.7%-6.4%  Diabetes:              >6.4%  Glycemic control for   <7.0% adults with diabetes     CBG: Recent Labs  Lab 12/28/20 1914 12/28/20 2327 12/28/20 2343 12/29/20 0321 12/29/20 0448  GLUCAP 123* 128* 120* 127* 133*    Allergies Allergies  Allergen Reactions  . Shrimp (Diagnostic)     Experiences facial droop when eating seafood       DVT/GI PRX  assessed I Assessed the need for Labs I Assessed the need for Foley I Assessed the need for Central Venous Line Family Discussion  when available I Assessed the need for Mobilization I made an Assessment of medications to be adjusted accordingly Safety Risk assessment completed  CASE DISCUSSED IN MULTIDISCIPLINARY ROUNDS WITH ICU TEAM     Critical Care Time devoted to patient care services described in this note is 46 minutes.   Overall, patient is critically ill, prognosis is guarded.  Patient with Multiorgan failure and at high risk for cardiac arrest and death.   Very poor chance of meaningful recovery Patient remains DNR status  Corrin Parker, M.D.  [facility]  Medical Director [place] Director [facility] Cardio-Pulmonary Department

## 2020-12-29 NOTE — Consult Note (Signed)
[place] for Electrolyte Monitoring and Replacement   Recent Labs: Potassium (mmol/L)  Date Value  12/29/2020 3.9   Magnesium (mg/dL)  Date Value  12/29/2020 2.3   Calcium (mg/dL)  Date Value  12/29/2020 10.4 (H)   Albumin (g/dL)  Date Value  12/28/2020 2.4 (L)  02/01/2020 4.4   Phosphorus (mg/dL)  Date Value  12/29/2020 2.6   Sodium (mmol/L)  Date Value  12/29/2020 144  02/01/2020 143   Corrected Ca: 11.7  Assessment: Patient is a 70 y/o M presented with sepsis. Pt presented with complaints of recurrent UTI and hematuria. 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 Pt has chronic tracheostomy and PEG tube. PMH includes HTN and HLD. Pharmacy was consulted to assist with electrolyte monitoring and replacement as indicated. He is being followed by cardiology and had noted episodes of bradycardia, PSVT.  Nutrition: tube feeds @ 75 mL/hr + Juven 1 packet BID   Goal of Therapy:  Potassium 4.0 - 5.1 mmol/L Magnesium 2.0 - 2.4 mg/dL All Other Electrolytes WNL  Plan:   Na 147>144 (improving) - continue free water 300 mL per tube q2h (3600 mL/day)  K 3.9 - ordered 20 mEq KCl per tube x 1   Follow-up electrolytes with AM labs  Benn Moulder, PharmD Pharmacy Resident  12/29/2020 7:05 AM

## 2020-12-29 NOTE — TOC Progression Note (Signed)
Transition of Care [facility]) - Initial/Assessment Note    Patient Details  Name: Ryan Day MRN: [id number] Date of Birth: 1951/05/30  [facility] CM/SW Contact:    Ova Freshwater Phone Number: [phone] 12/29/2020, 3:06 PM  Clinical Narrative:                  Patient is now DNR. Patient's wife Ryan Day [phone] still undecided about goals of care/comfort care.   Expected Discharge Plan: [place] Barriers to Discharge: Continued Medical Work up   Patient Goals and CMS Choice Patient states their goals for this hospitalization and ongoing recovery are:: To feel better and get back home.      Expected Discharge Plan and Services Expected Discharge Plan: [place] In-house Referral: Clinical Social Work   Post Acute Care Choice: Durable Medical Equipment Living arrangements for the past 2 months: Single Family Home                                      Prior Living Arrangements/Services Living arrangements for the past 2 months: Single Family Home Lives with:: Adult Children,Spouse Patient language and need for interpreter reviewed:: No Do you feel safe going back to the place where you live?: Yes      Need for Family Participation in Patient Care: Yes (Comment) Care giver support system in place?: Yes (comment) Current home services: DME Criminal Activity/Legal Involvement Pertinent to Current Situation/Hospitalization: No - Comment as needed  Activities of Daily Living      Permission Sought/Granted Permission sought to share information with : Family Supports    Share Information with NAME: Ryan Day, Ryan Day (Spouse)   [id number]           Emotional Assessment Appearance:: Appears stated age       Alcohol / Substance Use: Not Applicable Psych Involvement: No (comment)  Admission diagnosis:  Sinus tachycardia [R00.0] Quadriplegia ([place]) [G82.50] Tracheostomy dependent ([place])  [Z93.0] Septic shock ([place]) [A41.9, R65.21] Sepsis ([place]) [A41.9] Patient Active Problem List   Diagnosis Date Noted  . Sepsis ([place]) 12/07/2020  . Decubitus ulcer of sacral region, stage 4 ([place])   . Hypokalemia 08/15/2020  . Hypoalbuminemia 08/15/2020  . Leukocytosis 08/15/2020  . Pneumonia 08/14/2020  . Goals of care, counseling/discussion   . Sacral decubitus ulcer, stage II ([place]) 07/25/2020  . Aspiration pneumonia ([place]) 07/25/2020  . Palliative care by specialist   . Salivary secretion   . Spasticity   . Spinal cord injury, cervical region, sequela ([place]) 06/23/2020  . Pressure injury of skin 06/23/2020  . S/P percutaneous endoscopic gastrostomy (PEG) tube placement ([place])   . Tracheostomy in place [facility])   . Quadriplegia ([place])   . Neurogenic orthostatic hypotension ([place])   . Acute on chronic anemia   . Hypernatremia   . Neurogenic bladder   . Neurogenic bowel   . Acute on chronic respiratory failure with hypoxia ([name])   . Autonomic instability   . Cardiac arrest ([place])   . Cervical spinal cord injury, sequela ([place])   . Acute renal injury due to hypovolemia ([place])   . Family history of stomach cancer   . Exercise-induced leg cramps 05/13/2017  . Elevated alkaline phosphatase level 03/13/2015  . Hyperlipidemia   . Hypertension    PCP:  Valerie Roys, DO Pharmacy:   [facility] [address] ([address]), [place] -  [place] ([address]) [place] [address] Phone: [phone] Fax: [facility] [place], [address] [address] [address] Phone: [phone] Fax: [phone]     Social Determinants of Health (SDOH) Interventions    Readmission Risk Interventions No flowsheet data found.

## 2020-12-30 LAB — BASIC METABOLIC PANEL
Anion gap: 6 (ref 5–15)
BUN: 47 mg/dL — ABNORMAL HIGH (ref 8–23)
CO2: 30 mmol/L (ref 22–32)
Calcium: 10.4 mg/dL — ABNORMAL HIGH (ref 8.9–10.3)
Chloride: 106 mmol/L (ref 98–111)
Creatinine, Ser: 0.56 mg/dL — ABNORMAL LOW (ref 0.61–1.24)
GFR, Estimated: >60 mL/min (ref >60)
Glucose, Bld: 126 mg/dL — ABNORMAL HIGH (ref 70–99)
Potassium: 3.8 mmol/L (ref 3.5–5.1)
Sodium: 142 mmol/L (ref 135–145)

## 2020-12-30 LAB — GLUCOSE, CAPILLARY
Glucose-Capillary: 116 mg/dL — ABNORMAL HIGH (ref 70–99)
Glucose-Capillary: 120 mg/dL — ABNORMAL HIGH (ref 70–99)
Glucose-Capillary: 124 mg/dL — ABNORMAL HIGH (ref 70–99)
Glucose-Capillary: 130 mg/dL — ABNORMAL HIGH (ref 70–99)
Glucose-Capillary: 145 mg/dL — ABNORMAL HIGH (ref 70–99)
Glucose-Capillary: 148 mg/dL — ABNORMAL HIGH (ref 70–99)
Glucose-Capillary: 159 mg/dL — ABNORMAL HIGH (ref 70–99)

## 2020-12-30 LAB — CBC
HCT: 25 % — ABNORMAL LOW (ref 39.0–52.0)
Hemoglobin: 7.6 g/dL — ABNORMAL LOW (ref 13.0–17.0)
MCH: 28.3 pg (ref 26.0–34.0)
MCHC: 30.4 g/dL (ref 30.0–36.0)
MCV: 92.9 fL (ref 80.0–100.0)
Platelets: 196 10*3/uL (ref 150–400)
RBC: 2.69 MIL/uL — ABNORMAL LOW (ref 4.22–5.81)
RDW: 16.1 % — ABNORMAL HIGH (ref 11.5–15.5)
WBC: 6.3 10*3/uL (ref 4.0–10.5)
nRBC: 0 % (ref 0.0–0.2)

## 2020-12-30 LAB — MAGNESIUM: Magnesium: 2.2 mg/dL (ref 1.7–2.4)

## 2020-12-30 LAB — VANCOMYCIN, TROUGH: Vancomycin Tr: 12 ug/mL — ABNORMAL LOW (ref 15–20)

## 2020-12-30 LAB — PHOSPHORUS: Phosphorus: 3.2 mg/dL (ref 2.5–4.6)

## 2020-12-30 MED ORDER — CHLORHEXIDINE GLUCONATE 0.12 % MT SOLN
OROMUCOSAL | Status: AC
  Administered 2020-12-30: 15 mL via OROMUCOSAL
  Filled 2020-12-30: qty 15

## 2020-12-30 NOTE — Progress Notes (Signed)
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 According to pts family last week pt noted to have sediment in his urine at home concerning for UTI. He had a foley catheter placed 2 weeks ago and completed a 7 day course of abx therapy due to UTI. Due to concerns of recurrent UTI pts family contacted pts PCP office last week, and heard from his PCP this week and were awaiting urine culture results. However, pt noted to have hematuria yesterday and today along with sinus tachycardia hr 120's. Pts daughter is a Marine scientist and due to concern of sepsis pt transported to the ER via EMS for further evaluation and treatment.    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 C5 Cervical Fracture (following a mechanical fall at home) s/p C3-6 Laminectomy and Posterior Instrumentation and Fusion 08/4  Quadriplegia  Spinal Cord Compression  Chronic Tracheostomy and PEG   Significant Hospital Events: Including procedures, antibiotic start and stop dates in addition to other pertinent events   03/17: Pt admitted to ICU with urosepsis requiring vasopressors and acute on chronic hypoxic respiratory failure secondary to mucous plugging requiring exchange of cuffless trach to size 6 mm cuffed trach to be placed on mechanical ventilation  03/17: Right femoral central lineplaced by ER physician  03/17: CT Abd Pelvis findings with cystitis. Correlation with urinalysis is Recommended. Mild to moderate severity bibasilar atelectasis and/or Infiltrate. Sigmoid diverticulosis. Sacral decubitus ulcer, as described above. MRI correlation is recommended, as sequelae associated with acute osteomyelitis cannot be excluded. Aortic atherosclerosis. 03/17: CT Head revealed mild  chronic ischemic white matter disease. No acute intracranial abnormality seen 03/17: Vancomycin x1 dose and Cefepime x1 dose 03/17: Zosyn>>completed 03/27: On Cefepime 3/30severe bradycardia with decrease in PS mode 3/31 patient with increased WOB placed on full vent support 4/1remains on vent,+pneumonia, failure to wean from vent 4/2 severe resp distress, on full vent support 4/4patient failed another SBT he became bradycardic unresponsive still not able to communicate with providers wife is at the bedside and 2 daughters are still deciding about how to proceed with care 4/5wife and kids are by the bedside long discussion about prognosis they are still deciding what should happen. 4/6had a long discussion yesterday with the wife 2 daughters who are nurses in the room wife expressed thin desire for making him in comfort care in the medical meaning of comfort care due to her severe still not decided they needed some more time to decide which is the long plan forward was supposed to meet again today.  Micro 12/07/2020 blood culture has methicillin-resistant staph aureus, Enterobacter cloacae and Enterococcus faecalis  12/13/2020 blood culture: No growth so far  12/08/2020  bronchoalveolar lavage more than 100,000 colonies of Pseudomonas aeruginosa Methicillin-resistant staph aureus 40,000 colonies Enterococcus faecalis 20,000.  12/07/2020 urine culture Enterobacter cloacae   Sacral wound has rare Pseudomonas aeruginosa rare methicillin-resistant staph aureus few Bacteroides and rare Candida albicans. Likely stool contamination     Interim History / Subjective:  Alert Severe abd distention Severe resp distress when weaning vent Given pain meds significant amount of secretions Very poor prognosis      Objective   Blood pressure 96/61, pulse (!) 58, temperature 98.3 F (36.8 C), temperature source Axillary, resp. rate 16, height 6' 5.01" (1.956 m),  weight 119 kg, SpO2  100 %.    Vent Mode: PRVC FiO2 (%):  [40 %] 40 % Set Rate:  [18 bmp] 18 bmp Vt Set:  [500 mL] 500 mL PEEP:  [5 cmH20] 5 cmH20   Intake/Output Summary (Last 24 hours) at 12/30/2020 0559 Last data filed at 12/30/2020 0546 Gross per 24 hour  Intake 5157.65 ml  Output 5950 ml  Net -792.35 ml   Filed Weights   12/28/20 0316 12/29/20 0453 12/30/20 0441  Weight: 120.4 kg 121.4 kg 119 kg      REVIEW OF SYSTEMS  PATIENT IS UNABLE TO PROVIDE COMPLETE REVIEW OF SYSTEMS DUE TO SEVERE CRITICAL ILLNESS AND TOXIC METABOLIC ENCEPHALOPATHY   PHYSICAL EXAMINATION:  GENERAL:critically ill appearing, +resp distress HEAD: Normocephalic, atraumatic.  EYES: Pupils equal, round, reactive to light.  No scleral icterus.  MOUTH: Moist mucosal membrane. NECK: Supple. PULMONARY: +rhonchi, +wheezing CARDIOVASCULAR: S1 and S2. Regular rate and rhythm. No murmurs, rubs, or gallops.  GASTROINTESTINAL: Soft, nontender, -distended. Positive bowel sounds.  MUSCULOSKELETAL: No swelling, clubbing, or edema.  NEUROLOGIC: obtunded SKIN:intact,warm,dry   Labs/imaging that I havepersonally reviewed  (right click and "Reselect all SmartList Selections" daily)       ASSESSMENT AND PLAN SYNOPSIS   70 yo quadriplegic with multiple mecial issues with acute hypoxic resp failure due to severe polymicrobial sepsis and septic shockfrom pseudomonal infections of lungs and sacral decub Failure to wean from vent   Severe ACUTE Hypoxic and Hypercapnic Respiratory Failure -continue Mechanical Ventilator support -continue Bronchodilator Therapy -Wean Fio2 and PEEP as tolerated -VAP/VENT bundle implementation Can not wean from vent due to significant amount of secretions resp muscle fatigue-will try SAT/SBT when wife arrives to bedside  Vent Mode: PRVC FiO2 (%):  [40 %] 40 % Set Rate:  [18 bmp] 18 bmp Vt Set:  [500 mL] 500 mL PEEP:  [5 cmH20] 5 cmH20   CARDIAC FAILURE-acute diastolic  dysfunction -oxygen as needed -Lasix as tolerated -follow up cardiac enzymes as indicated   CARDIAC ICU monitoring   ACUTE KIDNEY INJURY/Renal Failure -continue Foley Catheter-assess need -Avoid nephrotoxic agents -Follow urine output, BMP -Ensure adequate renal perfusion, optimize oxygenation -Renal dose medications  Intake/Output Summary (Last 24 hours) at 12/30/2020 0602 Last data filed at 12/30/2020 0546 Gross per 24 hour  Intake 5157.65 ml  Output 4800 ml  Net 357.65 ml     NEUROLOGY Pain meds as needed Alert    SEPTIC SHOCK -use vasopressors to keep MAP>65 as needed  INFECTIOUS DISEASE PSEUDOMONAS AND MRSA -continue antibiotics as prescribed -follow up cultures -follow up ID consultation  ENDO - ICU hypoglycemic\Hyperglycemia protocol -check FSBS per protocol   GI GI PROPHYLAXIS as indicated  NUTRITIONAL STATUS DIET-->TF's as tolerated Constipation protocol as indicated   ELECTROLYTES -follow labs as needed -replace as needed -pharmacy consultation and following      Best practice (evaluated daily)  Diet: NPO is on tube feed Pain/Anxiety/Delirium protocol (if indicated): Yes (RASS goal 0) VAP protocol (if indicated): Yes DVT prophylaxis: Systemic AC GI prophylaxis: H2B Glucose control: SSI No Central venous access: Yes, and it is still needed PICC line right upper extremity Arterial line: N/A Foley: Yes, and it is still needed Mobility: bed rest PT consulted: N/A Last date of multidisciplinary goals of care discussion[N/A] Code Status: DNR status Disposition:ICU    Labs   CBC: Recent Labs  Lab 12/25/20 0500 12/26/20 0454 12/27/20 0502 12/28/20 0338 12/29/20 0441 12/30/20 0411  WBC 8.5 7.0 6.2 6.5 6.0 6.3  NEUTROABS  5.7 4.4  --  4.1  --   --   HGB 8.2* 8.2* 8.2* 7.9* 7.6* 7.6*  HCT 26.6* 25.8* 26.4* 25.4* 24.6* 25.0*  MCV 94.3 93.1 94.3 92.4 92.8 92.9  PLT 318 288 260 252 224 762    Basic Metabolic  Panel: Recent Labs  Lab 12/26/20 0454 12/27/20 0502 12/28/20 0338 12/29/20 0441 12/30/20 0411  NA 150* 147* 147* 144 142  K 3.6 3.8 3.8 3.9 3.8  CL 110 112* 111 108 106  CO2 33* 32 _0 GLUCOSE 138* 136* 124* 142* 126*  BUN 55* 56* 54* 49* 47*  CREATININE 0.77 0.80 0.71 0.70 0.56*  CALCIUM 10.2 10.3 10.2 10.4* 10.4*  MG 2.5* 2.6* 2.4 2.3 2.2  PHOS 2.9 3.0 2.6 2.6 3.2   GFR: Estimated Creatinine Clearance: 126.4 mL/min (A) (by C-G formula based on SCr of 0.56 mg/dL (L)). Recent Labs  Lab 12/27/20 0502 12/28/20 0338 12/29/20 0441 12/30/20 0411  WBC 6.2 6.5 6.0 6.3    Liver Function Tests: Recent Labs  Lab 12/26/20 0454 12/28/20 0338  AST 23 24  ALT 23 30  ALKPHOS 126 130*  BILITOT 0.7 0.7  PROT 7.4 7.2  ALBUMIN 2.6* 2.4*   No results for input(s): LIPASE, AMYLASE in the last 168 hours. No results for input(s): AMMONIA in the last 168 hours.  ABG    Component Value Date/Time   PHART 7.45 12/26/2020 0447   PCO2ART 52 (H) 12/26/2020 0447   PO2ART 83 12/26/2020 0447   HCO3 36.1 (H) 12/26/2020 0447   ACIDBASEDEF 0.4 12/08/2020 0500   O2SAT 96.6 12/26/2020 0447     Coagulation Profile: No results for input(s): INR, PROTIME in the last 168 hours.  Cardiac Enzymes: No results for input(s): CKTOTAL, CKMB, CKMBINDEX, TROPONINI in the last 168 hours.  HbA1C: Hgb A1c MFr Bld  Date/Time Value Ref Range Status  06/09/2020 07:00 AM 5.8 (H) 4.8 - 5.6 % Final    Comment:    (NOTE) Pre diabetes:          5.7%-6.4%  Diabetes:              >6.4%  Glycemic control for   <7.0% adults with diabetes     CBG: Recent Labs  Lab 12/29/20 1552 12/29/20 1914 12/29/20 2052 12/30/20 0036 12/30/20 0249  GLUCAP 136* 125* 119* 159* 124*    Allergies Allergies  Allergen Reactions  . Shrimp (Diagnostic)     Experiences facial droop when eating seafood       DVT/GI PRX  assessed I Assessed the need for Labs I Assessed the need for Foley I Assessed the  need for Central Venous Line Family Discussion when available I Assessed the need for Mobilization I made an Assessment of medications to be adjusted accordingly Safety Risk assessment completed  CASE DISCUSSED IN MULTIDISCIPLINARY ROUNDS WITH ICU TEAM     Critical Care Time devoted to patient care services described in this note is 45 minutes.   Overall, patient is critically ill, prognosis is guarded.  Patient with Multiorgan failure and at high risk for cardiac arrest and death.    Very poor chance of meaningful recovery Patient remains DNR status   Corrin Parker, M.D.  [facility]  Medical Director [place] Director [facility] Cardio-Pulmonary Department

## 2020-12-30 NOTE — Progress Notes (Signed)
Pharmacy Antibiotic Note  Ryan Day is a 70 y.o. male admitted on 12/07/2020 with sepsis. Pt presented with complaints of recurrent UTI and hematuria with foley cath last placed ~2 weeks ago, exchanged in ED. 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 Pt has PEG and trache. Pt admitted 11/22-12/5 for HCAP and sacral ulcer with osteomyelitis. Hx of enterobacter cloacae, pseudomonas in ucx. Pt has received vanc, Unasyn, Zosyn, and cefepime on recent previous admissions. ID consulted. Pt with polymicrobial bacteremia with sepsis resulting from multiple sources including UTI, PNA, and sacral wound. Meropenem was discontinued 3/22 based on sensitivity to ceftazidime from BAL. IV Cipro was added due to increased secretions, now d/c'd on 3/31 following 11 days of therapy. Pharmacy was consulted for vancomycin dosing. His creatinine has improved since admission, now stable ~0.7 (baseline ~0.5). Planning to complete 4 weeks of treatment   Day 22 abx - WBC WNL, afebrile   Plan:  1) continue cefepime 2 grams IV every 8 hours    2) continue vancomycin 1000 mg IV every 24 hours. Follow up with level in 4-5 days or later if clinical appropriate.   Per ID pt will need 4 weeks of abx therapy.     Height: 6' 5.01" (195.6 cm) Weight: 119 kg (262 lb 5.6 oz) IBW/kg (Calculated) : 89.12   Temp (24hrs), Avg:98.4 F (36.9 C), Min:98 F (36.7 C), Max:98.9 F (37.2 C)  Recent Labs  Lab 12/26/20 0454 12/27/20 0502 12/28/20 0338 12/29/20 0441 12/29/20 1519 12/30/20 0411 12/30/20 1040  WBC 7.0 6.2 6.5 6.0  --  6.3  --   CREATININE 0.77 0.80 0.71 0.70  --  0.56*  --   VANCOTROUGH  --   --   --   --   --   --  12*  VANCOPEAK  --   --   --   --  25*  --   --   VANCORANDOM 13  --   --   --   --   --   --     Estimated Creatinine Clearance: 126.4 mL/min (A) (by C-G formula based on SCr of 0.56 mg/dL (L)).    Allergies  Allergen Reactions  . Shrimp (Diagnostic)      Experiences facial droop when eating seafood    Antimicrobials this admission: 3/20 Meropenem >> 3/21  3/22 Cipro >> 3/31 3/17 Vancomycin  >>  3/17 Cefepime  >> 3/20  3/22 >>  Microbiology results: 3/17 MRSA PCR: positive 3/17 BCx: E cloacae, MRSA, E faecalis 3/17 UCx: E cloacae 3/18 Resp cx: PSA 3/18 BAL: PSA (ceftazidime sensitive), MRSA, E faecalis 3/21 Sacral cx: MRSA, PSA 3/23 BCx: NGTD 3/27 Respiratory Cx: PSA (gentamicin sensitive) - MDR PSA likely colonization - treating based on BAL susc.    Thank you for allowing pharmacy to be a part of this patient's care.  Oswald Hillock, PharmD 12/30/2020 11:32 AM

## 2020-12-30 NOTE — Consult Note (Signed)
[place] for Electrolyte Monitoring and Replacement   Recent Labs: Potassium (mmol/L)  Date Value  12/30/2020 3.8   Magnesium (mg/dL)  Date Value  12/30/2020 2.2   Calcium (mg/dL)  Date Value  12/30/2020 10.4 (H)   Albumin (g/dL)  Date Value  12/28/2020 2.4 (L)  02/01/2020 4.4   Phosphorus (mg/dL)  Date Value  12/30/2020 3.2   Sodium (mmol/L)  Date Value  12/30/2020 142  02/01/2020 143   Corrected Ca: 11.7  Assessment: Patient is a 70 y/o M presented with sepsis. Pt presented with complaints of recurrent UTI and hematuria. 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 Pt has chronic tracheostomy and PEG tube. PMH includes HTN and HLD. Pharmacy was consulted to assist with electrolyte monitoring and replacement as indicated. He is being followed by cardiology and had noted episodes of bradycardia, PSVT.  Nutrition: tube feeds @ 75 mL/hr + Juven 1 packet BID   Goal of Therapy:  Potassium 4.0 - 5.1 mmol/L Magnesium 2.0 - 2.4 mg/dL All Other Electrolytes WNL  Plan:   No replacement needed at this time.   Follow-up electrolytes with AM labs  Oswald Hillock, PharmD  12/30/2020 10:29 AM

## 2020-12-31 LAB — GLUCOSE, CAPILLARY
Glucose-Capillary: 154 mg/dL — ABNORMAL HIGH (ref 70–99)
Glucose-Capillary: 121 mg/dL — ABNORMAL HIGH (ref 70–99)
Glucose-Capillary: 109 mg/dL — ABNORMAL HIGH (ref 70–99)
Glucose-Capillary: 128 mg/dL — ABNORMAL HIGH (ref 70–99)
Glucose-Capillary: 108 mg/dL — ABNORMAL HIGH (ref 70–99)
Glucose-Capillary: 129 mg/dL — ABNORMAL HIGH (ref 70–99)

## 2020-12-31 LAB — BASIC METABOLIC PANEL
Anion gap: 4 — ABNORMAL LOW (ref 5–15)
BUN: 46 mg/dL — ABNORMAL HIGH (ref 8–23)
CO2: 31 mmol/L (ref 22–32)
Calcium: 10.5 mg/dL — ABNORMAL HIGH (ref 8.9–10.3)
Chloride: 106 mmol/L (ref 98–111)
Creatinine, Ser: 0.74 mg/dL (ref 0.61–1.24)
GFR, Estimated: >60 mL/min (ref >60)
Glucose, Bld: 121 mg/dL — ABNORMAL HIGH (ref 70–99)
Potassium: 3.9 mmol/L (ref 3.5–5.1)
Sodium: 141 mmol/L (ref 135–145)

## 2020-12-31 LAB — CBC WITH DIFFERENTIAL/PLATELET
Abs Immature Granulocytes: 0.01 10*3/uL (ref 0.00–0.07)
Basophils Absolute: 0 10*3/uL (ref 0.0–0.1)
Basophils Relative: 0 %
Eosinophils Absolute: 0.7 10*3/uL — ABNORMAL HIGH (ref 0.0–0.5)
Eosinophils Relative: 8 %
HCT: 25 % — ABNORMAL LOW (ref 39.0–52.0)
Hemoglobin: 7.8 g/dL — ABNORMAL LOW (ref 13.0–17.0)
Immature Granulocytes: 0 %
Lymphocytes Relative: 9 %
Lymphs Abs: 0.7 10*3/uL (ref 0.7–4.0)
MCH: 28.3 pg (ref 26.0–34.0)
MCHC: 31.2 g/dL (ref 30.0–36.0)
MCV: 90.6 fL (ref 80.0–100.0)
Monocytes Absolute: 1 10*3/uL (ref 0.1–1.0)
Monocytes Relative: 12 %
Neutro Abs: 6 10*3/uL (ref 1.7–7.7)
Neutrophils Relative %: 71 %
Platelets: 193 10*3/uL (ref 150–400)
RBC: 2.76 MIL/uL — ABNORMAL LOW (ref 4.22–5.81)
RDW: 15.9 % — ABNORMAL HIGH (ref 11.5–15.5)
Smear Review: NORMAL
WBC: 8.4 10*3/uL (ref 4.0–10.5)
nRBC: 0 % (ref 0.0–0.2)

## 2020-12-31 LAB — MAGNESIUM: Magnesium: 2.2 mg/dL (ref 1.7–2.4)

## 2020-12-31 LAB — PHOSPHORUS: Phosphorus: 2.6 mg/dL (ref 2.5–4.6)

## 2020-12-31 LAB — TRIGLYCERIDES: Triglycerides: 75 mg/dL (ref <150)

## 2020-12-31 MED ORDER — IPRATROPIUM-ALBUTEROL 0.5-2.5 (3) MG/3ML IN SOLN
3.0000 mL | Freq: Four times a day (QID) | RESPIRATORY_TRACT | Status: DC
  Administered 2020-12-31 – 2021-01-03 (×12): 3 mL via RESPIRATORY_TRACT
  Filled 2020-12-31 (×11): qty 3

## 2020-12-31 MED ORDER — GLYCOPYRROLATE 0.2 MG/ML IJ SOLN
0.2000 mg | INTRAMUSCULAR | Status: DC | PRN
  Administered 2020-12-31 – 2021-01-01 (×3): 0.2 mg via INTRAVENOUS
  Filled 2020-12-31 (×4): qty 1

## 2020-12-31 NOTE — Progress Notes (Signed)
Pharmacy Antibiotic Note  Ryan Day is a 70 y.o. male admitted on 12/07/2020 with sepsis. Pt presented with complaints of recurrent UTI and hematuria with foley cath last placed ~2 weeks ago, exchanged in ED. 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 Pt has PEG and trache. Pt admitted 11/22-12/5 for HCAP and sacral ulcer with osteomyelitis. Hx of enterobacter cloacae, pseudomonas in ucx. Pt has received vanc, Unasyn, Zosyn, and cefepime on recent previous admissions. ID consulted. Pt with polymicrobial bacteremia with sepsis resulting from multiple sources including UTI, PNA, and sacral wound. Meropenem was discontinued 3/22 based on sensitivity to ceftazidime from BAL. IV Cipro was added due to increased secretions, now d/c'd on 3/31 following 11 days of therapy. Pharmacy was consulted for vancomycin dosing. His creatinine has improved since admission, now stable ~0.7 (baseline ~0.5). Planning to complete 4 weeks of treatment   Day 22 abx - WBC WNL, afebrile   Plan:  1) continue cefepime 2 grams IV every 8 hours    2) continue vancomycin 1000 mg IV every 24 hours. Follow up with level in 4-5 days or later if clinical appropriate.   Per ID pt will need 4 weeks of abx therapy.     Height: 6' 5.01" (195.6 cm) Weight: 121.3 kg (267 lb 6.7 oz) IBW/kg (Calculated) : 89.12   Temp (24hrs), Avg:97.1 F (36.2 C), Min:96.08 F (35.6 C), Max:98.42 F (36.9 C)  Recent Labs  Lab 12/26/20 0454 12/27/20 0502 12/28/20 0338 12/29/20 0441 12/29/20 1519 12/30/20 0411 12/30/20 1040 12/31/20 0435  WBC 7.0 6.2 6.5 6.0  --  6.3  --  8.4  CREATININE 0.77 0.80 0.71 0.70  --  0.56*  --  0.74  VANCOTROUGH  --   --   --   --   --   --  12*  --   VANCOPEAK  --   --   --   --  25*  --   --   --   VANCORANDOM 13  --   --   --   --   --   --   --     Estimated Creatinine Clearance: 127.5 mL/min (by C-G formula based on SCr of 0.74 mg/dL).    Allergies  Allergen  Reactions  . Shrimp (Diagnostic)     Experiences facial droop when eating seafood    Antimicrobials this admission: 3/20 Meropenem >> 3/21  3/22 Cipro >> 3/31 3/17 Vancomycin  >>  3/17 Cefepime  >> 3/20  3/22 >>  Microbiology results: 3/17 MRSA PCR: positive 3/17 BCx: E cloacae, MRSA, E faecalis 3/17 UCx: E cloacae 3/18 Resp cx: PSA 3/18 BAL: PSA (ceftazidime sensitive), MRSA, E faecalis 3/21 Sacral cx: MRSA, PSA 3/23 BCx: NGTD 3/27 Respiratory Cx: PSA (gentamicin sensitive) - MDR PSA likely colonization - treating based on BAL susc.    Thank you for allowing pharmacy to be a part of this patient's care.  Oswald Hillock, PharmD 12/31/2020 8:12 AM

## 2020-12-31 NOTE — Consult Note (Signed)
[place] for Electrolyte Monitoring and Replacement   Recent Labs: Potassium (mmol/L)  Date Value  12/31/2020 3.9   Magnesium (mg/dL)  Date Value  12/31/2020 2.2   Calcium (mg/dL)  Date Value  12/31/2020 10.5 (H)   Albumin (g/dL)  Date Value  12/28/2020 2.4 (L)  02/01/2020 4.4   Phosphorus (mg/dL)  Date Value  12/31/2020 2.6   Sodium (mmol/L)  Date Value  12/31/2020 141  02/01/2020 143   Corrected Ca: 11.7  Assessment: Patient is a 70 y/o M presented with sepsis. Pt presented with complaints of recurrent UTI and hematuria. 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 Pt has chronic tracheostomy and PEG tube. PMH includes HTN and HLD. Pharmacy was consulted to assist with electrolyte monitoring and replacement as indicated. He is being followed by cardiology and had noted episodes of bradycardia, PSVT.  On free water 300 mL q2H.   Nutrition: tube feeds @ 75 mL/hr + Juven 1 packet BID   Goal of Therapy:  Potassium 4.0 - 5.1 mmol/L Magnesium 2.0 - 2.4 mg/dL All Other Electrolytes WNL  Plan:   No replacement needed at this time.   Follow-up electrolytes with AM labs  Oswald Hillock, PharmD  12/31/2020 8:04 AM

## 2020-12-31 NOTE — Progress Notes (Signed)
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 According to pts family last week pt noted to have sediment in his urine at home concerning for UTI. He had a foley catheter placed 2 weeks ago and completed a 7 day course of abx therapy due to UTI. Due to concerns of recurrent UTI pts family contacted pts PCP office last week, and heard from his PCP this week and were awaiting urine culture results. However, pt noted to have hematuria yesterday and today along with sinus tachycardia hr 120's. Pts daughter is a Marine scientist and due to concern of sepsis pt transported to the ER via EMS for further evaluation and treatment.    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 C5 Cervical Fracture (following a mechanical fall at home) s/p C3-6 Laminectomy and Posterior Instrumentation and Fusion 08/4  Quadriplegia  Spinal Cord Compression  Chronic Tracheostomy and PEG   Significant Hospital Events: Including procedures, antibiotic start and stop dates in addition to other pertinent events   03/17: Pt admitted to ICU with urosepsis requiring vasopressors and acute on chronic hypoxic respiratory failure secondary to mucous plugging requiring exchange of cuffless trach to size 6 mm cuffed trach to be placed on mechanical ventilation  03/17: Right femoral central lineplaced by ER physician  03/17: CT Abd Pelvis findings with cystitis. Correlation with urinalysis is Recommended. Mild to moderate severity bibasilar atelectasis and/or Infiltrate. Sigmoid diverticulosis. Sacral decubitus ulcer, as described above. MRI correlation is recommended, as sequelae associated with acute osteomyelitis cannot be excluded. Aortic atherosclerosis. 03/17: CT Head revealed mild chronic ischemic  white matter disease. No acute intracranial abnormality seen 03/17: Vancomycin x1 dose and Cefepime x1 dose 03/17: Zosyn>>completed 03/27: On Cefepime 3/30severe bradycardia with decrease in PS mode 3/31 patient with increased WOB placed on full vent support 4/1remains on vent,+pneumonia, failure to wean from vent 4/2 severe resp distress, on full vent support 4/4patient failed another SBT he became bradycardic unresponsive still not able to communicate with providers wife is at the bedside and 2 daughters are still deciding about how to proceed with care 4/5wife and kids are by the bedside long discussion about prognosis they are still deciding what should happen. 4/6had a long discussion yesterday with the wife 2 daughters who are nurses in the room wife expressed thin desire for making him in comfort care in the medical meaning of comfort care due to her severe still not decided they needed some more time to decide which is the long plan forward was supposed to meet again today. 4/10 weaning off sedatives. Begin SBT again.Glycopyrrolate x 1 for copious secretions  Micro 12/07/2020 blood culture has methicillin-resistant staph aureus, Enterobacter cloacae and Enterococcus faecalis  12/13/2020 blood culture: No growth so far  12/08/2020  bronchoalveolar lavage more than 100,000 colonies of Pseudomonas aeruginosa Methicillin-resistant staph aureus 40,000 colonies Enterococcus faecalis 20,000.  12/07/2020 urine culture Enterobacter cloacae   Sacral wound has rare Pseudomonas aeruginosa rare methicillin-resistant staph aureus few Bacteroides and rare Candida albicans. Likely stool contamination       Medications Scheduled Meds: . ascorbic acid  500 mg Per Tube BID  . baclofen  10 mg Per Tube QID  . chlorhexidine gluconate (MEDLINE KIT)  15 mL Mouth Rinse BID  . Chlorhexidine Gluconate Cloth  6 each Topical  Daily  . diclofenac Sodium  4 g Topical QID  . ferrous sulfate   75 mg Per Tube Daily  . FLUoxetine  40 mg Per Tube Daily  . free water  300 mL Per Tube Q2H  . guaiFENesin  400 mg Per Tube Q6H  . insulin aspart  0-15 Units Subcutaneous Q4H  . ipratropium-albuterol  3 mL Nebulization Q6H  . mouth rinse  15 mL Mouth Rinse 10 times per day  . midodrine  5 mg Per Tube BID WC  . nutrition supplement (JUVEN)  1 packet Per Tube BID BM  . polycarbophil  625 mg Per Tube Daily  . rivaroxaban  10 mg Per Tube Daily  . saccharomyces boulardii  250 mg Per Tube BID  . simethicone  80 mg Per Tube QID  . traZODone  50 mg Per Tube QHS  . zinc sulfate  220 mg Per Tube Daily   Continuous Infusions: . sodium chloride 5 mL/hr at 12/31/20 0600  . ceFEPime (MAXIPIME) IV Stopped (12/31/20 0558)  . famotidine (PEPCID) IV Stopped (12/30/20 2248)  . feeding supplement (PIVOT 1.5 CAL) 1,000 mL (12/31/20 0742)  . propofol (DIPRIVAN) infusion Stopped (12/31/20 0908)  . vancomycin 1,000 mg (12/31/20 1205)   PRN Meds:.sodium chloride, acetaminophen, bisacodyl, docusate sodium, fentaNYL (SUBLIMAZE) injection, glycopyrrolate, midazolam, morphine injection, ondansetron (ZOFRAN) IV, polyethylene glycol, polyvinyl alcohol, sodium chloride flush  Anti-infectives (From admission, onward)   Start     Dose/Rate Route Frequency Ordered Stop   12/26/20 1200  vancomycin (VANCOREADY) IVPB 1000 mg/200 mL        1,000 mg 200 mL/hr over 60 Minutes Intravenous Every 24 hours 12/26/20 1044     12/26/20 1100  vancomycin (VANCOREADY) IVPB 750 mg/150 mL  Status:  Discontinued        750 mg 150 mL/hr over 60 Minutes Intravenous Every 24 hours 12/26/20 0947 12/26/20 1000   12/26/20 1100  vancomycin (VANCOCIN) 750 mg in sodium chloride 0.9 % 250 mL IVPB  Status:  Discontinued        750 mg 250 mL/hr over 60 Minutes Intravenous Every 24 hours 12/26/20 1001 12/26/20 1044   12/25/20 1339  vancomycin variable dose per unstable renal function (pharmacist dosing)  Status:  Discontinued         Does not  apply See admin instructions 12/25/20 1339 12/26/20 0947   12/20/20 2000  vancomycin (VANCOREADY) IVPB 500 mg/100 mL  Status:  Discontinued        500 mg 100 mL/hr over 60 Minutes Intravenous Every 12 hours 12/20/20 1709 12/25/20 1339   12/17/20 1400  vancomycin (VANCOREADY) IVPB 750 mg/150 mL  Status:  Discontinued        750 mg 150 mL/hr over 60 Minutes Intravenous Every 12 hours 12/17/20 1159 12/20/20 0604   12/13/20 1000  vancomycin (VANCOREADY) IVPB 1000 mg/200 mL  Status:  Discontinued        1,000 mg 200 mL/hr over 60 Minutes Intravenous Every 12 hours 12/13/20 0832 12/17/20 1159   12/12/20 1800  ciprofloxacin (CIPRO) IVPB 400 mg  Status:  Discontinued        400 mg 200 mL/hr over 60 Minutes Intravenous Every 8 hours 12/12/20 1620 12/21/20 1529   12/12/20 1700  ciprofloxacin (CIPRO) IVPB 400 mg  Status:  Discontinued        400 mg 200 mL/hr over 60 Minutes Intravenous Every 12 hours 12/12/20 1609 12/12/20 1620   12/12/20 1615  vancomycin variable dose per unstable renal function (pharmacist  dosing)  Status:  Discontinued         Does not apply See admin instructions 12/12/20 1615 12/13/20 1050   12/12/20 1100  vancomycin (VANCOREADY) IVPB 1750 mg/350 mL        1,750 mg 175 mL/hr over 120 Minutes Intravenous  Once 12/12/20 0957 12/12/20 1301   12/12/20 0600  ceFEPIme (MAXIPIME) 2 g in sodium chloride 0.9 % 100 mL IVPB        2 g 200 mL/hr over 30 Minutes Intravenous Every 8 hours 12/11/20 1929     12/11/20 1522  vancomycin variable dose per unstable renal function (pharmacist dosing)  Status:  Discontinued         Does not apply See admin instructions 12/11/20 1522 12/12/20 1607   12/11/20 0900  vancomycin (VANCOREADY) IVPB 1000 mg/200 mL  Status:  Discontinued        1,000 mg 200 mL/hr over 60 Minutes Intravenous Every 12 hours 12/11/20 0813 12/11/20 1522   12/10/20 1515  meropenem (MERREM) 1 g in sodium chloride 0.9 % 100 mL IVPB        1 g 200 mL/hr over 30 Minutes Intravenous  Every 8 hours 12/10/20 1418 12/12/20 0834   12/09/20 1000  ceFEPIme (MAXIPIME) 2 g in sodium chloride 0.9 % 100 mL IVPB  Status:  Discontinued        2 g 200 mL/hr over 30 Minutes Intravenous Every 8 hours 12/09/20 0811 12/10/20 1418   12/09/20 0730  vancomycin (VANCOREADY) IVPB 2000 mg/400 mL        2,000 mg 200 mL/hr over 120 Minutes Intravenous  Once 12/09/20 0644 12/09/20 1616   12/09/20 0600  vancomycin (VANCOREADY) IVPB 1750 mg/350 mL  Status:  Discontinued        1,750 mg 175 mL/hr over 120 Minutes Intravenous Every 24 hours 12/08/20 0809 12/08/20 1229   12/08/20 2200  ceFEPIme (MAXIPIME) 2 g in sodium chloride 0.9 % 100 mL IVPB  Status:  Discontinued        2 g 200 mL/hr over 30 Minutes Intravenous Every 12 hours 12/08/20 0809 12/09/20 0811   12/08/20 1700  vancomycin (VANCOREADY) IVPB 1500 mg/300 mL  Status:  Discontinued        1,500 mg 150 mL/hr over 120 Minutes Intravenous Every 12 hours 12/08/20 0500 12/08/20 0809   12/08/20 1230  vancomycin variable dose per unstable renal function (pharmacist dosing)  Status:  Discontinued         Does not apply See admin instructions 12/08/20 1230 12/11/20 1359   12/08/20 0545  ceFEPIme (MAXIPIME) 2 g in sodium chloride 0.9 % 100 mL IVPB  Status:  Discontinued        2 g 200 mL/hr over 30 Minutes Intravenous Every 8 hours 12/08/20 0446 12/08/20 0809   12/08/20 0530  vancomycin (VANCOREADY) IVPB 2000 mg/400 mL       "Followed by" Linked Group Details   2,000 mg 200 mL/hr over 120 Minutes Intravenous  Once 12/08/20 0441 12/08/20 0815   12/08/20 0530  vancomycin (VANCOREADY) IVPB 500 mg/100 mL  Status:  Discontinued       "Followed by" Linked Group Details   500 mg 100 mL/hr over 60 Minutes Intravenous  Once 12/08/20 0441 12/08/20 0803   12/07/20 2215  piperacillin-tazobactam (ZOSYN) IVPB 3.375 g  Status:  Discontinued        3.375 g 12.5 mL/hr over 240 Minutes Intravenous Every 8 hours 12/07/20 2124 12/08/20 0447   12/07/20 1800  ceFEPIme (MAXIPIME) 2 g in sodium chloride 0.9 % 100 mL IVPB        2 g 200 mL/hr over 30 Minutes Intravenous  Once 12/07/20 1757 12/07/20 2036   12/07/20 1800  vancomycin (VANCOCIN) IVPB 1000 mg/200 mL premix        1,000 mg 200 mL/hr over 60 Minutes Intravenous  Once 12/07/20 1757 12/07/20 1942        Allergies Allergies  Allergen Reactions  . Shrimp (Diagnostic)     Experiences facial droop when eating seafood    Interim History / Subjective:  Sedated this AM, more alert now off of propofol. significant amount of secretions Multi resistant organisms Very poor prognosis  Objective   Blood pressure 112/62, pulse 77, temperature 98.06 F (36.7 C), resp. rate 14, height 6' 5.01" (1.956 m), weight 121.3 kg, SpO2 98 %.    Vent Mode: PRVC FiO2 (%):  [40 %] 40 % Set Rate:  [18 bmp] 18 bmp Vt Set:  [500 mL] 500 mL PEEP:  [5 cmH20] 5 cmH20   Intake/Output Summary (Last 24 hours) at 12/31/2020 0918 Last data filed at 12/31/2020 0600 Gross per 24 hour  Intake 1736.82 ml  Output 4035 ml  Net -2298.18 ml   Filed Weights   12/29/20 0453 12/30/20 0441 12/31/20 0348  Weight: 121.4 kg 119 kg 121.3 kg    REVIEW OF SYSTEMS Unable to obtain due to mechanically ventilated status.   PHYSICAL EXAMINATION: General: chronically appearing male, NAD receiving mechanical ventilation via trach  HENT: supple, no JVD, tracheostomy in place Lungs: diminished throughout, synchronous with vent Cardiovascular: A. fib with CVR, no R/G, 2+ radial/1+ distal pulses, 1+ bilateral lower extremity edema  Abdomen: hypoactive BS x, soft, non distended, PEG in place Extremities: quadriplegic Neuro: Sedated earlier, off of sedatives able to interact with nodding Skin: sacral  decubitus as below GU: chronic foley in place   Pressure Injury 07/26/20 Sacrum Unstageable - Full thickness tissue loss in which the base of the injury is covered by slough (yellow, tan, gray, green or brown) and/or eschar (tan,  brown or black) in the wound bed. (Active)  07/26/20 0513  Location: Sacrum  Location Orientation:   Staging: Unstageable - Full thickness tissue loss in which the base of the injury is covered by slough (yellow, tan, gray, green or brown) and/or eschar (tan, brown or black) in the wound bed.  Wound Description (Comments):   Present on Admission: Yes     Pressure Injury 12/07/20 Heel Right (Active)  12/07/20 -- (present upon admission)  Location: Heel  Location Orientation: Right  Staging:   Wound Description (Comments):   Present on Admission: Yes     Pressure Injury 12/07/20 Heel Left (Active)  12/07/20 -- (present on admission)  Location: Heel  Location Orientation: Left  Staging:   Wound Description (Comments):   Present on Admission: Yes   ASSESSMENT AND PLAN  SYNOPSIS: 70 yo quadriplegic with multiple medical issues with acute on chronic hypoxic resp failure due to severe polymicrobial sepsis and septic shockfrom pseudomonal infections of lungs and sacral decub. Failure to wean from vent.  Acute on chronic respiratory failure due to quadriplegia Recent episodes of septic shock due to multi resistant organisms Copious secretions and quadriplegia limiting weaning Continue ventilator support Sedatives weaned off [facility] SBT Continue VAP vent bundle  Glycopyrrolate as needed Adjust nebs SAT/SBT when wife at bedside  Chronic diastolic heart failure Tachybrady arrhythmias Grade II diastolic dysfunction LVEF 55 to 60% Arrhythmias likely  neurogenic induced Maintain electrolyte balance   Acute renal failure BUN and creatinine have normalized Continue to monitor Avoid nephrotoxic agents Monitor urine output, renal panel Pharmacy consulting to renally dose medications Has chronic Foley in place    SEPTIC SHOCK - resolved -use vasopressors to keep MAP>65 as needed  PSEUDOMONAS AND MRSA infections Completed Cipro On vancomycin ID  following  ENDO ICU hyperglycemia/hypoglycemia protocol Sliding scale insulin as needed    Best practice (evaluated daily)  Diet: NPO is on tube feed Pain/Anxiety/Delirium protocol (if indicated): Yes (RASS goal 0) VAP protocol (if indicated): Yes DVT prophylaxis: Systemic AC GI prophylaxis: H2B Glucose control: SSI No Central venous access: Yes, and it is still needed PICC line right upper extremity Arterial line: N/A Foley: Yes, and it is still needed, chronic need due to quadriplegia Mobility: bed rest PT consulted: N/A Last date of multidisciplinary goals of care discussion[N/A] Code Status: DNR status Disposition:ICU  Updated wife at bedside today 4/10.  Labs   CBC: Recent Labs  Lab 12/25/20 0500 12/26/20 0454 12/27/20 0502 12/28/20 0338 12/29/20 0441 12/30/20 0411 12/31/20 0435  WBC 8.5 7.0 6.2 6.5 6.0 6.3 8.4  NEUTROABS 5.7 4.4  --  4.1  --   --  6.0  HGB 8.2* 8.2* 8.2* 7.9* 7.6* 7.6* 7.8*  HCT 26.6* 25.8* 26.4* 25.4* 24.6* 25.0* 25.0*  MCV 94.3 93.1 94.3 92.4 92.8 92.9 90.6  PLT 318 288 260 252 224 196 563    Basic Metabolic Panel: Recent Labs  Lab 12/27/20 0502 12/28/20 0338 12/29/20 0441 12/30/20 0411 12/31/20 0435  NA 147* 147* 144 142 141  K 3.8 3.8 3.9 3.8 3.9  CL 112* 111 108 106 106  CO2 32 _0 GLUCOSE 136* 124* 142* 126* 121*  BUN 56* 54* 49* 47* 46*  CREATININE 0.80 0.71 0.70 0.56* 0.74  CALCIUM 10.3 10.2 10.4* 10.4* 10.5*  MG 2.6* 2.4 2.3 2.2 2.2  PHOS 3.0 2.6 2.6 3.2 2.6   GFR: Estimated Creatinine Clearance: 127.5 mL/min (by C-G formula based on SCr of 0.74 mg/dL). Recent Labs  Lab 12/28/20 0338 12/29/20 0441 12/30/20 0411 12/31/20 0435  WBC 6.5 6.0 6.3 8.4    Liver Function Tests: Recent Labs  Lab 12/26/20 0454 12/28/20 0338  AST 23 24  ALT 23 30  ALKPHOS 126 130*  BILITOT 0.7 0.7  PROT 7.4 7.2  ALBUMIN 2.6* 2.4*   No results for input(s): LIPASE, AMYLASE in the last 168 hours. No  results for input(s): AMMONIA in the last 168 hours.  ABG    Component Value Date/Time   PHART 7.45 12/26/2020 0447   PCO2ART 52 (H) 12/26/2020 0447   PO2ART 83 12/26/2020 0447   HCO3 36.1 (H) 12/26/2020 0447   ACIDBASEDEF 0.4 12/08/2020 0500   O2SAT 96.6 12/26/2020 0447     Coagulation Profile: No results for input(s): INR, PROTIME in the last 168 hours.  Cardiac Enzymes: No results for input(s): CKTOTAL, CKMB, CKMBINDEX, TROPONINI in the last 168 hours.  HbA1C: Hgb A1c MFr Bld  Date/Time Value Ref Range Status  06/09/2020 07:00 AM 5.8 (H) 4.8 - 5.6 % Final    Comment:    (NOTE) Pre diabetes:          5.7%-6.4%  Diabetes:              >6.4%  Glycemic control for   <7.0% adults with diabetes     CBG: Recent Labs  Lab 12/30/20 1544 12/30/20 1936 12/30/20 2327 12/31/20  9539 12/31/20 0729  GLUCAP 130* 148* 120* 154* 121*     Critical Care Time devoted to patient care services described in this note is 40 minutes.  Poor long-term prognosis Patient remains DNR status   LOS:24    C. Derrill [name], MD [facility]   *This note was dictated using voice recognition software/Dragon.  Despite best efforts to proofread, errors can occur which can change the meaning.  Any change was purely unintentional.

## 2021-01-01 ENCOUNTER — Inpatient Hospital Stay: Payer: Medicare Other

## 2021-01-01 ENCOUNTER — Encounter: Payer: Self-pay | Admitting: Internal Medicine

## 2021-01-01 LAB — ALBUMIN: Albumin: 2.8 g/dL — ABNORMAL LOW (ref 3.5–5.0)

## 2021-01-01 LAB — GLUCOSE, CAPILLARY
Glucose-Capillary: 137 mg/dL — ABNORMAL HIGH (ref 70–99)
Glucose-Capillary: 123 mg/dL — ABNORMAL HIGH (ref 70–99)
Glucose-Capillary: 120 mg/dL — ABNORMAL HIGH (ref 70–99)
Glucose-Capillary: 134 mg/dL — ABNORMAL HIGH (ref 70–99)
Glucose-Capillary: 164 mg/dL — ABNORMAL HIGH (ref 70–99)
Glucose-Capillary: 118 mg/dL — ABNORMAL HIGH (ref 70–99)

## 2021-01-01 LAB — MAGNESIUM: Magnesium: 2.1 mg/dL (ref 1.7–2.4)

## 2021-01-01 LAB — CBC
HCT: 24.2 % — ABNORMAL LOW (ref 39.0–52.0)
Hemoglobin: 7.7 g/dL — ABNORMAL LOW (ref 13.0–17.0)
MCH: 28.5 pg (ref 26.0–34.0)
MCHC: 31.8 g/dL (ref 30.0–36.0)
MCV: 89.6 fL (ref 80.0–100.0)
Platelets: 184 10*3/uL (ref 150–400)
RBC: 2.7 MIL/uL — ABNORMAL LOW (ref 4.22–5.81)
RDW: 16 % — ABNORMAL HIGH (ref 11.5–15.5)
WBC: 7.5 10*3/uL (ref 4.0–10.5)
nRBC: 0 % (ref 0.0–0.2)

## 2021-01-01 LAB — BASIC METABOLIC PANEL
Anion gap: 5 (ref 5–15)
BUN: 38 mg/dL — ABNORMAL HIGH (ref 8–23)
CO2: 31 mmol/L (ref 22–32)
Calcium: 10.4 mg/dL — ABNORMAL HIGH (ref 8.9–10.3)
Chloride: 104 mmol/L (ref 98–111)
Creatinine, Ser: 0.62 mg/dL (ref 0.61–1.24)
GFR, Estimated: >60 mL/min (ref >60)
Glucose, Bld: 143 mg/dL — ABNORMAL HIGH (ref 70–99)
Potassium: 3.6 mmol/L (ref 3.5–5.1)
Sodium: 140 mmol/L (ref 135–145)

## 2021-01-01 LAB — PHOSPHORUS: Phosphorus: 2.5 mg/dL (ref 2.5–4.6)

## 2021-01-01 LAB — MISC LABCORP TEST (SEND OUT)
LabCorp test name: 88005
Labcorp test code: 88005

## 2021-01-01 IMAGING — DX DG CHEST 1V PORT
1 series · 1 of 1 positions shown · non-contrast
Comparison: [DATE]

CLINICAL DATA: Acute respiratory failure

EXAM:
PORTABLE CHEST 1 VIEW

[chest ap]
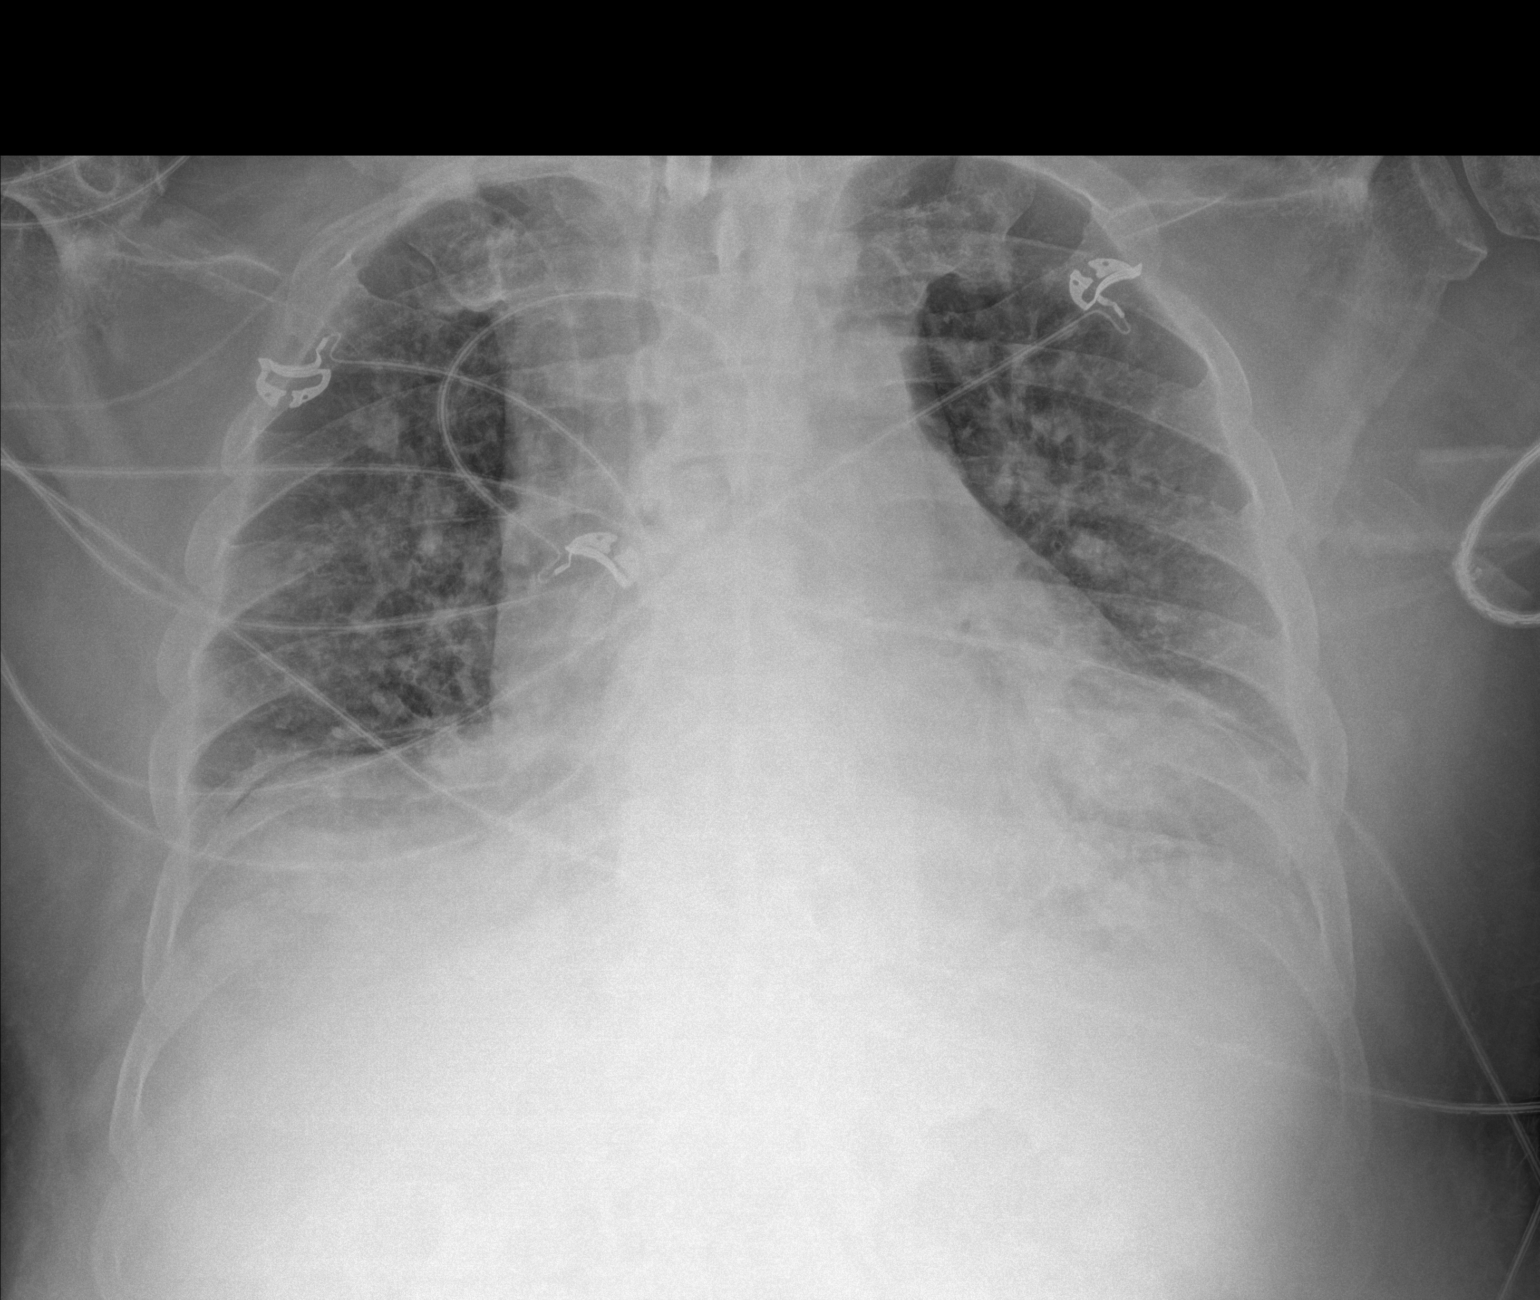

[1 of 1 positions shown; findings below may reference images not displayed]

FINDINGS: Tracheostomy again identified. Right upper extremity PICC line is
unchanged with its tip within the superior vena cava. Lung volumes
are small, but are stable and are symmetric. Mild bilateral patchy
pulmonary infiltrates are again identified and appear stable since
prior examination, though are improved since remote prior
examination of [DATE] and are suggestive of a resolving
infectious or inflammatory process. No pneumothorax or pleural
effusion. Mild cardiomegaly is stable. No acute bone abnormality.
IMPRESSION: Stable patchy bilateral pulmonary infiltrates, improved since remote
prior examination and suggestive of a resolving infectious or
inflammatory process.

Stable support lines and tubes.

## 2021-01-01 MED ORDER — POTASSIUM CHLORIDE 20 MEQ PO PACK
40.0000 meq | PACK | Freq: Once | ORAL | Status: AC
  Administered 2021-01-01: 40 meq
  Filled 2021-01-01: qty 2

## 2021-01-01 MED ORDER — FREE WATER
100.0000 mL | Status: DC
  Administered 2021-01-01 – 2021-01-02 (×5): 100 mL

## 2021-01-01 MED ORDER — MODAFINIL 100 MG PO TABS
100.0000 mg | ORAL_TABLET | Freq: Every day | ORAL | Status: DC
  Administered 2021-01-01 – 2021-01-05 (×5): 100 mg via ORAL
  Filled 2021-01-01 (×5): qty 1

## 2021-01-01 MED ORDER — GUAIFENESIN 100 MG/5ML PO SOLN
20.0000 mL | Freq: Four times a day (QID) | ORAL | Status: DC
  Administered 2021-01-01 – 2021-01-05 (×15): 400 mg via ORAL
  Filled 2021-01-01 (×21): qty 20

## 2021-01-01 MED ORDER — DICLOFENAC SODIUM 1 % EX GEL
4.0000 g | Freq: Four times a day (QID) | CUTANEOUS | Status: DC | PRN
  Filled 2021-01-01: qty 100

## 2021-01-01 MED ORDER — ALBUMIN HUMAN 25 % IV SOLN
12.5000 g | Freq: Once | INTRAVENOUS | Status: AC
  Administered 2021-01-01: 12.5 g via INTRAVENOUS
  Filled 2021-01-01: qty 50

## 2021-01-01 MED ORDER — GUAIFENESIN 100 MG/5ML PO SYRP
20.0000 mL | ORAL_SOLUTION | Freq: Four times a day (QID) | ORAL | Status: DC
  Administered 2021-01-01: 20 mL via ORAL
  Filled 2021-01-01 (×5): qty 20

## 2021-01-01 MED ORDER — FREE WATER
200.0000 mL | Status: DC
  Administered 2021-01-01 (×3): 200 mL

## 2021-01-01 NOTE — Progress Notes (Signed)
RT called to bedside from RN to let me know pt had started having short shallow breaths and thinks patient needs to rest on ventilator for the night. Pt had been on trach collar all day and was tiring out. Called Dr and made aware and agrees that patient should rest tonight. Pt is now back on pressure support and doing well.

## 2021-01-01 NOTE — Progress Notes (Signed)
Status quo day. Wife in briefly to visit. Remains in NSR. Changed to trach collar at 1442 per RT. Still tolerating trach collar at 1800. Still having copious amounts of white thick sputum. Suctioned every 30 minutes to 1 hour all day. Minimally interacting. Does answer yes/ no questions. Slept most of the day. No leakage from rectal tube noted. Stool remians loose. Urine output 3000. Rectal foley 100 mls output. Grimaces with ROM or turning.

## 2021-01-01 NOTE — Progress Notes (Signed)
[facility] Progress Note Patient Name: Elmin Wiederholt DOB: Jun 13, 1951 MRN: [id number]   Date of Service  01/01/2021  HPI/Events of Note  Patient tachypneic and showing signs of fatigue.  eICU Interventions  Nursing communication order entered instructing RT to rest patient on the ventilator if fatigued.        Kerry Kass Johnmichael Melhorn 01/01/2021, 10:15 PM

## 2021-01-01 NOTE — Progress Notes (Signed)
[facility]) Hospital Liaison note:  This patient is currently enrolled in [facility] outpatient-based Palliative Care. Will continue to follow for disposition.  Please call with any outpatient palliative questions or concerns.  Thank you, Lorelee Market, LPN [facility] Liaison [phone]

## 2021-01-01 NOTE — Progress Notes (Signed)
Patient placed on 40% ATC to trial off of ventilator.

## 2021-01-01 NOTE — Progress Notes (Signed)
Patient on TC respirations  Shallow in the low teens, then becoming shallow and rapid in the 30's. Patient appears to be tired. Patient continues with copious respirations. Call to RT to see if plan is to put  patient back on vent for the night. They stated no that they wanted to leave patient off for the night explained patients breathing has changed and he appears tired.

## 2021-01-01 NOTE — Consult Note (Addendum)
[place] for Electrolyte Monitoring and Replacement   Recent Labs: Potassium (mmol/L)  Date Value  01/01/2021 3.6   Magnesium (mg/dL)  Date Value  01/01/2021 2.1   Calcium (mg/dL)  Date Value  01/01/2021 10.4 (H)   Albumin (g/dL)  Date Value  01/01/2021 2.8 (L)  02/01/2020 4.4   Phosphorus (mg/dL)  Date Value  01/01/2021 2.5   Sodium (mmol/L)  Date Value  01/01/2021 140  02/01/2020 143    Assessment: Patient is a 70 y/o M presented with sepsis. Pt presented with complaints of recurrent UTI and hematuria. 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 Pt has chronic tracheostomy and PEG tube. PMH includes HTN and HLD. Pharmacy was consulted to assist with electrolyte monitoring and replacement as indicated. He is being followed by cardiology and had noted episodes of bradycardia, PSVT.  Nutrition: tube feeds @ 75 mL/hr + Juven 1 packet BID + free water 200 mL per tube every 2 hours   Goal of Therapy:  Potassium 4.0 - 5.1 mmol/L Magnesium 2.0 - 2.4 mg/dL All Other Electrolytes WNL  Plan:   Sodium continued to trend down: lower free water to 100 mL per tube every 4 hours  40 mEq KCl per tube x 1  Follow-up electrolytes with AM labs  Dallie Piles, PharmD  01/01/2021 7:11 AM

## 2021-01-01 NOTE — Progress Notes (Signed)
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 According to pts family last week pt noted to have sediment in his urine at home concerning for UTI. He had a foley catheter placed 2 weeks ago and completed a 7 day course of abx therapy due to UTI. Due to concerns of recurrent UTI pts family contacted pts PCP office last week, and heard from his PCP this week and were awaiting urine culture results. However, pt noted to have hematuria yesterday and today along with sinus tachycardia hr 120's. Pts daughter is a Marine scientist and due to concern of sepsis pt transported to the ER via EMS for further evaluation and treatment.    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 C5 Cervical Fracture (following a mechanical fall at home) s/p C3-6 Laminectomy and Posterior Instrumentation and Fusion 08/4  Quadriplegia  Spinal Cord Compression  Chronic Tracheostomy and PEG   Significant Hospital Events: Including procedures, antibiotic start and stop dates in addition to other pertinent events   03/17: Pt admitted to ICU with urosepsis requiring vasopressors and acute on chronic hypoxic respiratory failure secondary to mucous plugging requiring exchange of cuffless trach to size 6 mm cuffed trach to be placed on mechanical ventilation  03/17: Right femoral central lineplaced by ER physician  03/17: CT Abd Pelvis findings with cystitis. Correlation with urinalysis is Recommended. Mild to moderate severity bibasilar atelectasis and/or Infiltrate. Sigmoid diverticulosis. Sacral decubitus ulcer, as described above. MRI correlation is recommended, as sequelae associated with acute osteomyelitis cannot be excluded. Aortic atherosclerosis. 03/17: CT Head revealed mild chronic ischemic  white matter disease. No acute intracranial abnormality seen 03/17: Vancomycin x1 dose and Cefepime x1 dose 03/17: Zosyn>>completed 03/27: On Cefepime 3/30severe bradycardia with decrease in PS mode 3/31 patient with increased WOB placed on full vent support 4/1remains on vent,+pneumonia, failure to wean from vent 4/2 severe resp distress, on full vent support 4/4patient failed another SBT he became bradycardic unresponsive still not able to communicate with providers wife is at the bedside and 2 daughters are still deciding about how to proceed with care 4/5wife and kids are by the bedside long discussion about prognosis they are still deciding what should happen. 4/6had a long discussion yesterday with the wife 2 daughters who are nurses in the room wife expressed thin desire for making him in comfort care in the medical meaning of comfort care due to her severe still not decided they needed some more time to decide which is the long plan forward was supposed to meet again today. 4/10 weaning off sedatives. Begin SBT again.Glycopyrrolate x 1 for copious secretions  Micro 12/07/2020 blood culture has methicillin-resistant staph aureus, Enterobacter cloacae and Enterococcus faecalis  12/13/2020 blood culture: No growth so far  12/08/2020  bronchoalveolar lavage more than 100,000 colonies of Pseudomonas aeruginosa Methicillin-resistant staph aureus 40,000 colonies Enterococcus faecalis 20,000.  12/07/2020 urine culture Enterobacter cloacae   Sacral wound has rare Pseudomonas aeruginosa rare methicillin-resistant staph aureus few Bacteroides and rare Candida albicans. Likely stool contamination       Medications Scheduled Meds: . ascorbic acid  500 mg Per Tube BID  . baclofen  10 mg Per Tube QID  . chlorhexidine gluconate (MEDLINE KIT)  15 mL Mouth Rinse BID  . Chlorhexidine Gluconate Cloth  6 each Topical  Daily  . diclofenac Sodium  4 g Topical QID  . ferrous sulfate   75 mg Per Tube Daily  . FLUoxetine  40 mg Per Tube Daily  . free water  200 mL Per Tube Q2H  . guaiFENesin  400 mg Per Tube Q6H  . insulin aspart  0-15 Units Subcutaneous Q4H  . ipratropium-albuterol  3 mL Nebulization Q6H  . mouth rinse  15 mL Mouth Rinse 10 times per day  . midodrine  5 mg Per Tube BID WC  . nutrition supplement (JUVEN)  1 packet Per Tube BID BM  . polycarbophil  625 mg Per Tube Daily  . potassium chloride  40 mEq Per Tube Once  . rivaroxaban  10 mg Per Tube Daily  . saccharomyces boulardii  250 mg Per Tube BID  . simethicone  80 mg Per Tube QID  . traZODone  50 mg Per Tube QHS  . zinc sulfate  220 mg Per Tube Daily   Continuous Infusions: . sodium chloride 5 mL/hr at 01/01/21 0600  . ceFEPime (MAXIPIME) IV Stopped (01/01/21 0554)  . famotidine (PEPCID) IV Stopped (12/31/20 2137)  . feeding supplement (PIVOT 1.5 CAL) 1,000 mL (12/31/20 2314)  . propofol (DIPRIVAN) infusion Stopped (12/31/20 0908)  . vancomycin Stopped (12/31/20 1306)   PRN Meds:.sodium chloride, acetaminophen, bisacodyl, docusate sodium, fentaNYL (SUBLIMAZE) injection, glycopyrrolate, midazolam, morphine injection, ondansetron (ZOFRAN) IV, polyethylene glycol, polyvinyl alcohol, sodium chloride flush  Anti-infectives (From admission, onward)   Start     Dose/Rate Route Frequency Ordered Stop   12/26/20 1200  vancomycin (VANCOREADY) IVPB 1000 mg/200 mL        1,000 mg 200 mL/hr over 60 Minutes Intravenous Every 24 hours 12/26/20 1044     12/26/20 1100  vancomycin (VANCOREADY) IVPB 750 mg/150 mL  Status:  Discontinued        750 mg 150 mL/hr over 60 Minutes Intravenous Every 24 hours 12/26/20 0947 12/26/20 1000   12/26/20 1100  vancomycin (VANCOCIN) 750 mg in sodium chloride 0.9 % 250 mL IVPB  Status:  Discontinued        750 mg 250 mL/hr over 60 Minutes Intravenous Every 24 hours 12/26/20 1001 12/26/20 1044   12/25/20 1339  vancomycin variable dose per unstable renal function (pharmacist  dosing)  Status:  Discontinued         Does not apply See admin instructions 12/25/20 1339 12/26/20 0947   12/20/20 2000  vancomycin (VANCOREADY) IVPB 500 mg/100 mL  Status:  Discontinued        500 mg 100 mL/hr over 60 Minutes Intravenous Every 12 hours 12/20/20 1709 12/25/20 1339   12/17/20 1400  vancomycin (VANCOREADY) IVPB 750 mg/150 mL  Status:  Discontinued        750 mg 150 mL/hr over 60 Minutes Intravenous Every 12 hours 12/17/20 1159 12/20/20 0604   12/13/20 1000  vancomycin (VANCOREADY) IVPB 1000 mg/200 mL  Status:  Discontinued        1,000 mg 200 mL/hr over 60 Minutes Intravenous Every 12 hours 12/13/20 0832 12/17/20 1159   12/12/20 1800  ciprofloxacin (CIPRO) IVPB 400 mg  Status:  Discontinued        400 mg 200 mL/hr over 60 Minutes Intravenous Every 8 hours 12/12/20 1620 12/21/20 1529   12/12/20 1700  ciprofloxacin (CIPRO) IVPB 400 mg  Status:  Discontinued        400 mg 200 mL/hr over 60 Minutes Intravenous Every 12 hours 12/12/20 1609 12/12/20 1620   12/12/20 1615  vancomycin variable dose per unstable renal function (pharmacist dosing)  Status:  Discontinued         Does not apply See admin instructions 12/12/20 1615 12/13/20 1050   12/12/20 1100  vancomycin (VANCOREADY) IVPB 1750 mg/350 mL        1,750 mg 175 mL/hr over 120 Minutes Intravenous  Once 12/12/20 0957 12/12/20 1301   12/12/20 0600  ceFEPIme (MAXIPIME) 2 g in sodium chloride 0.9 % 100 mL IVPB        2 g 200 mL/hr over 30 Minutes Intravenous Every 8 hours 12/11/20 1929     12/11/20 1522  vancomycin variable dose per unstable renal function (pharmacist dosing)  Status:  Discontinued         Does not apply See admin instructions 12/11/20 1522 12/12/20 1607   12/11/20 0900  vancomycin (VANCOREADY) IVPB 1000 mg/200 mL  Status:  Discontinued        1,000 mg 200 mL/hr over 60 Minutes Intravenous Every 12 hours 12/11/20 0813 12/11/20 1522   12/10/20 1515  meropenem (MERREM) 1 g in sodium chloride 0.9 % 100 mL IVPB         1 g 200 mL/hr over 30 Minutes Intravenous Every 8 hours 12/10/20 1418 12/12/20 0834   12/09/20 1000  ceFEPIme (MAXIPIME) 2 g in sodium chloride 0.9 % 100 mL IVPB  Status:  Discontinued        2 g 200 mL/hr over 30 Minutes Intravenous Every 8 hours 12/09/20 0811 12/10/20 1418   12/09/20 0730  vancomycin (VANCOREADY) IVPB 2000 mg/400 mL        2,000 mg 200 mL/hr over 120 Minutes Intravenous  Once 12/09/20 0644 12/09/20 1616   12/09/20 0600  vancomycin (VANCOREADY) IVPB 1750 mg/350 mL  Status:  Discontinued        1,750 mg 175 mL/hr over 120 Minutes Intravenous Every 24 hours 12/08/20 0809 12/08/20 1229   12/08/20 2200  ceFEPIme (MAXIPIME) 2 g in sodium chloride 0.9 % 100 mL IVPB  Status:  Discontinued        2 g 200 mL/hr over 30 Minutes Intravenous Every 12 hours 12/08/20 0809 12/09/20 0811   12/08/20 1700  vancomycin (VANCOREADY) IVPB 1500 mg/300 mL  Status:  Discontinued        1,500 mg 150 mL/hr over 120 Minutes Intravenous Every 12 hours 12/08/20 0500 12/08/20 0809   12/08/20 1230  vancomycin variable dose per unstable renal function (pharmacist dosing)  Status:  Discontinued         Does not apply See admin instructions 12/08/20 1230 12/11/20 1359   12/08/20 0545  ceFEPIme (MAXIPIME) 2 g in sodium chloride 0.9 % 100 mL IVPB  Status:  Discontinued        2 g 200 mL/hr over 30 Minutes Intravenous Every 8 hours 12/08/20 0446 12/08/20 0809   12/08/20 0530  vancomycin (VANCOREADY) IVPB 2000 mg/400 mL       "Followed by" Linked Group Details   2,000 mg 200 mL/hr over 120 Minutes Intravenous  Once 12/08/20 0441 12/08/20 0815   12/08/20 0530  vancomycin (VANCOREADY) IVPB 500 mg/100 mL  Status:  Discontinued       "Followed by" Linked Group Details   500 mg 100 mL/hr over 60 Minutes Intravenous  Once 12/08/20 0441 12/08/20 0803   12/07/20 2215  piperacillin-tazobactam (ZOSYN) IVPB 3.375 g  Status:  Discontinued        3.375 g 12.5 mL/hr over 240 Minutes Intravenous Every 8 hours  12/07/20  2124 12/08/20 0447   12/07/20 1800  ceFEPIme (MAXIPIME) 2 g in sodium chloride 0.9 % 100 mL IVPB        2 g 200 mL/hr over 30 Minutes Intravenous  Once 12/07/20 1757 12/07/20 2036   12/07/20 1800  vancomycin (VANCOCIN) IVPB 1000 mg/200 mL premix        1,000 mg 200 mL/hr over 60 Minutes Intravenous  Once 12/07/20 1757 12/07/20 1942        Allergies Allergies  Allergen Reactions  . Shrimp (Diagnostic)     Experiences facial droop when eating seafood    Interim History / Subjective:  No events. Awake, mouthing words appropriately. Denies pain.  Objective   Blood pressure 119/64, pulse 79, temperature (!) 97.52 F (36.4 C), resp. rate 17, height 6' 5.01" (1.956 m), weight 120.2 kg, SpO2 99 %.    Vent Mode: PRVC FiO2 (%):  [40 %] 40 % Set Rate:  [18 bmp] 18 bmp Vt Set:  [500 mL] 500 mL PEEP:  [5 cmH20] 5 cmH20 Pressure Support:  [10 cmH20] 10 cmH20 Plateau Pressure:  [15 cmH20-20 cmH20] 20 cmH20   Intake/Output Summary (Last 24 hours) at 01/01/2021 0650 Last data filed at 01/01/2021 0600 Gross per 24 hour  Intake 4604.43 ml  Output 4350 ml  Net 254.43 ml   Filed Weights   12/30/20 0441 12/31/20 0348 01/01/21 0419  Weight: 119 kg 121.3 kg 120.2 kg   Constitutional: chronically ill appearing man watching TV  Eyes: able to track, pupils equal Ears, nose, mouth, and throat: trach in place with copious this secretions Cardiovascular: RRR, ext warm Respiratory: scattered rhonci, no wheezing Gastrointestinal: protuberant, firm, rectal tube in place Skin: No rashes, normal turgor Neurologic: quadriplegic Psychiatric: answering subjective by mouthing yes and no   Pressure Injury 07/26/20 Sacrum Unstageable - Full thickness tissue loss in which the base of the injury is covered by slough (yellow, tan, gray, green or brown) and/or eschar (tan, brown or black) in the wound bed. (Active)  07/26/20 0513  Location: Sacrum  Location Orientation:   Staging: Unstageable  - Full thickness tissue loss in which the base of the injury is covered by slough (yellow, tan, gray, green or brown) and/or eschar (tan, brown or black) in the wound bed.  Wound Description (Comments):   Present on Admission: Yes     Pressure Injury 12/07/20 Heel Right (Active)  12/07/20 -- (present upon admission)  Location: Heel  Location Orientation: Right  Staging:   Wound Description (Comments):   Present on Admission: Yes     Pressure Injury 12/07/20 Heel Left (Active)  12/07/20 -- (present on admission)  Location: Heel  Location Orientation: Left  Staging:   Wound Description (Comments):   Present on Admission: Yes   Net even Potassium repleted Calcium slightly high question bone breaskdowbn Stable anemia Stable WBC/plts CBG okay CXR stable multifocal infiltrates  ASSESSMENT AND PLAN  SYNOPSIS: 70 yo quadriplegic with multiple medical issues with acute on chronic hypoxic resp failure due to severe polymicrobial sepsis and septic shockfrom pseudomonal infections of lungs and sacral decub. Failure to wean from vent.  Acute on chronic respiratory failure due to quadriplegia Recent episodes of septic shock due to multi resistant organisms Copious secretions and quadriplegia limiting weaning Continue vent support with VAP prevention bundle PRN glycopyrrolate to lessen secretion burden   Chronic diastolic heart failure Tachybrady arrhythmias Grade II diastolic dysfunction LVEF 55 to 60% Arrhythmias likely neurogenic induced Maintain electrolyte balance Net neg 12.1L this admission,  keep even to negative  Acute renal failure- resolved   SEPTIC SHOCK - resolved -use vasopressors to keep MAP>65 as needed  PSEUDOMONAS AND MRSA infections Completed Cipro On vancomycin ID following, defer duration  ENDO ICU hyperglycemia/hypoglycemia protocol Sliding scale insulin as needed  Best practice (evaluated daily)  Diet: NPO is on tube  feed Pain/Anxiety/Delirium protocol (if indicated): Yes (RASS goal 0) VAP protocol (if indicated): Yes DVT prophylaxis: Systemic AC GI prophylaxis: H2B Glucose control: SSI No Central venous access: Yes, and it is still needed PICC line right upper extremity Arterial line: N/A Foley: Yes, and it is still needed, chronic need due to quadriplegia Mobility: bed rest PT consulted: N/A Last date of multidisciplinary goals of care discussion[N/A] Code Status: DNR status Disposition:ICU  Will update wife if and when comes in ?Vent/SNF candidate if family does not want comfort  Erskine Emery MD

## 2021-01-01 NOTE — Progress Notes (Signed)
Advance Progress Note Patient Name: Ryan Day DOB: 04-26-1951 MRN: [id number]   Date of Service  01/01/2021  HPI/Events of Note  Patient with tachypnea and signs of respiratory fatigue.  eICU Interventions  Nursing communication order entered regarding resting patient on the ventilator for respiratory distress or fatigue.     Intervention Category Major Interventions: Respiratory failure - evaluation and management Intermediate Interventions: Respiratory distress - evaluation and management  Frederik Pear 01/01/2021, 10:17 PM

## 2021-01-01 NOTE — Progress Notes (Signed)
Call placed to return patient daughter's call. No answer.

## 2021-01-02 LAB — BASIC METABOLIC PANEL
Anion gap: 5 (ref 5–15)
BUN: 40 mg/dL — ABNORMAL HIGH (ref 8–23)
CO2: 31 mmol/L (ref 22–32)
Calcium: 10.8 mg/dL — ABNORMAL HIGH (ref 8.9–10.3)
Chloride: 107 mmol/L (ref 98–111)
Creatinine, Ser: 0.52 mg/dL — ABNORMAL LOW (ref 0.61–1.24)
GFR, Estimated: >60 mL/min (ref >60)
Glucose, Bld: 129 mg/dL — ABNORMAL HIGH (ref 70–99)
Potassium: 4.1 mmol/L (ref 3.5–5.1)
Sodium: 143 mmol/L (ref 135–145)

## 2021-01-02 LAB — PHOSPHORUS: Phosphorus: 2.7 mg/dL (ref 2.5–4.6)

## 2021-01-02 LAB — MAGNESIUM: Magnesium: 2.4 mg/dL (ref 1.7–2.4)

## 2021-01-02 LAB — GLUCOSE, CAPILLARY
Glucose-Capillary: 150 mg/dL — ABNORMAL HIGH (ref 70–99)
Glucose-Capillary: 115 mg/dL — ABNORMAL HIGH (ref 70–99)
Glucose-Capillary: 126 mg/dL — ABNORMAL HIGH (ref 70–99)
Glucose-Capillary: 109 mg/dL — ABNORMAL HIGH (ref 70–99)
Glucose-Capillary: 128 mg/dL — ABNORMAL HIGH (ref 70–99)

## 2021-01-02 MED ORDER — FREE WATER
200.0000 mL | Status: DC
  Administered 2021-01-02 – 2021-01-03 (×7): 200 mL

## 2021-01-02 NOTE — Progress Notes (Signed)
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 According to pts family last week pt noted to have sediment in his urine at home concerning for UTI. He had a foley catheter placed 2 weeks ago and completed a 7 day course of abx therapy due to UTI. Due to concerns of recurrent UTI pts family contacted pts PCP office last week, and heard from his PCP this week and were awaiting urine culture results. However, pt noted to have hematuria yesterday and today along with sinus tachycardia hr 120's. Pts daughter is a Marine scientist and due to concern of sepsis pt transported to the ER via EMS for further evaluation and treatment.    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 C5 Cervical Fracture (following a mechanical fall at home) s/p C3-6 Laminectomy and Posterior Instrumentation and Fusion 08/4  Quadriplegia  Spinal Cord Compression  Chronic Tracheostomy and PEG   Significant Hospital Events: Including procedures, antibiotic start and stop dates in addition to other pertinent events   03/17: Pt admitted to ICU with urosepsis requiring vasopressors and acute on chronic hypoxic respiratory failure secondary to mucous plugging requiring exchange of cuffless trach to size 6 mm cuffed trach to be placed on mechanical ventilation  03/17: Right femoral central lineplaced by ER physician  03/17: CT Abd Pelvis findings with cystitis. Correlation with urinalysis is Recommended. Mild to moderate severity bibasilar atelectasis and/or Infiltrate. Sigmoid diverticulosis. Sacral decubitus ulcer, as described above. MRI correlation is recommended, as sequelae associated with acute osteomyelitis cannot be excluded. Aortic atherosclerosis. 03/17: CT Head revealed mild chronic ischemic  white matter disease. No acute intracranial abnormality seen 03/17: Vancomycin x1 dose and Cefepime x1 dose 03/17: Zosyn>>completed 03/27: On Cefepime 3/30severe bradycardia with decrease in PS mode 3/31 patient with increased WOB placed on full vent support 4/1remains on vent,+pneumonia, failure to wean from vent 4/2 severe resp distress, on full vent support 4/4patient failed another SBT he became bradycardic unresponsive still not able to communicate with providers wife is at the bedside and 2 daughters are still deciding about how to proceed with care 4/5wife and kids are by the bedside long discussion about prognosis they are still deciding what should happen. 4/6had a long discussion yesterday with the wife 2 daughters who are nurses in the room wife expressed thin desire for making him in comfort care in the medical meaning of comfort care due to her severe still not decided they needed some more time to decide which is the long plan forward was supposed to meet again today. 4/10 weaning off sedatives. Begin SBT again.Glycopyrrolate x 1 for copious secretions 4/11 tolerated TC for a few hours  Micro 12/07/2020 blood culture has methicillin-resistant staph aureus, Enterobacter cloacae and Enterococcus faecalis  12/13/2020 blood culture: No growth so far  12/08/2020  bronchoalveolar lavage more than 100,000 colonies of Pseudomonas aeruginosa Methicillin-resistant staph aureus 40,000 colonies Enterococcus faecalis 20,000.  12/07/2020 urine culture Enterobacter cloacae   Sacral wound has rare Pseudomonas aeruginosa rare methicillin-resistant staph aureus few Bacteroides and rare Candida albicans. Likely stool contamination       Medications Scheduled Meds: . ascorbic acid  500 mg Per Tube BID  . baclofen  10 mg Per Tube QID  . chlorhexidine gluconate (MEDLINE KIT)  15 mL Mouth Rinse BID  .  Chlorhexidine Gluconate Cloth  6 each Topical Daily  . ferrous sulfate  75 mg  Per Tube Daily  . FLUoxetine  40 mg Per Tube Daily  . free water  100 mL Per Tube Q4H  . guaiFENesin  20 mL Oral Q6H  . insulin aspart  0-15 Units Subcutaneous Q4H  . ipratropium-albuterol  3 mL Nebulization Q6H  . mouth rinse  15 mL Mouth Rinse 10 times per day  . midodrine  5 mg Per Tube BID WC  . modafinil  100 mg Oral Daily  . nutrition supplement (JUVEN)  1 packet Per Tube BID BM  . polycarbophil  625 mg Per Tube Daily  . rivaroxaban  10 mg Per Tube Daily  . saccharomyces boulardii  250 mg Per Tube BID  . simethicone  80 mg Per Tube QID  . traZODone  50 mg Per Tube QHS  . zinc sulfate  220 mg Per Tube Daily   Continuous Infusions: . sodium chloride 5 mL/hr at 01/02/21 0600  . ceFEPime (MAXIPIME) IV Stopped (01/02/21 0543)  . famotidine (PEPCID) IV Stopped (01/01/21 2247)  . feeding supplement (PIVOT 1.5 CAL) 1,000 mL (01/01/21 1513)  . vancomycin Stopped (01/01/21 1423)   PRN Meds:.sodium chloride, acetaminophen, bisacodyl, diclofenac Sodium, docusate sodium, fentaNYL (SUBLIMAZE) injection, glycopyrrolate, midazolam, morphine injection, ondansetron (ZOFRAN) IV, polyethylene glycol, polyvinyl alcohol, sodium chloride flush  Anti-infectives (From admission, onward)   Start     Dose/Rate Route Frequency Ordered Stop   12/26/20 1200  vancomycin (VANCOREADY) IVPB 1000 mg/200 mL        1,000 mg 200 mL/hr over 60 Minutes Intravenous Every 24 hours 12/26/20 1044     12/26/20 1100  vancomycin (VANCOREADY) IVPB 750 mg/150 mL  Status:  Discontinued        750 mg 150 mL/hr over 60 Minutes Intravenous Every 24 hours 12/26/20 0947 12/26/20 1000   12/26/20 1100  vancomycin (VANCOCIN) 750 mg in sodium chloride 0.9 % 250 mL IVPB  Status:  Discontinued        750 mg 250 mL/hr over 60 Minutes Intravenous Every 24 hours 12/26/20 1001 12/26/20 1044   12/25/20 1339  vancomycin variable dose per unstable renal function (pharmacist dosing)  Status:  Discontinued         Does not apply See admin  instructions 12/25/20 1339 12/26/20 0947   12/20/20 2000  vancomycin (VANCOREADY) IVPB 500 mg/100 mL  Status:  Discontinued        500 mg 100 mL/hr over 60 Minutes Intravenous Every 12 hours 12/20/20 1709 12/25/20 1339   12/17/20 1400  vancomycin (VANCOREADY) IVPB 750 mg/150 mL  Status:  Discontinued        750 mg 150 mL/hr over 60 Minutes Intravenous Every 12 hours 12/17/20 1159 12/20/20 0604   12/13/20 1000  vancomycin (VANCOREADY) IVPB 1000 mg/200 mL  Status:  Discontinued        1,000 mg 200 mL/hr over 60 Minutes Intravenous Every 12 hours 12/13/20 0832 12/17/20 1159   12/12/20 1800  ciprofloxacin (CIPRO) IVPB 400 mg  Status:  Discontinued        400 mg 200 mL/hr over 60 Minutes Intravenous Every 8 hours 12/12/20 1620 12/21/20 1529   12/12/20 1700  ciprofloxacin (CIPRO) IVPB 400 mg  Status:  Discontinued        400 mg 200 mL/hr over 60 Minutes Intravenous Every 12 hours 12/12/20 1609 12/12/20 1620   12/12/20 1615  vancomycin variable dose per unstable renal function (pharmacist dosing)  Status:  Discontinued         Does not apply See admin instructions 12/12/20 1615 12/13/20 1050   12/12/20 1100  vancomycin (VANCOREADY) IVPB 1750 mg/350 mL        1,750 mg 175 mL/hr over 120 Minutes Intravenous  Once 12/12/20 0957 12/12/20 1301   12/12/20 0600  ceFEPIme (MAXIPIME) 2 g in sodium chloride 0.9 % 100 mL IVPB        2 g 200 mL/hr over 30 Minutes Intravenous Every 8 hours 12/11/20 1929     12/11/20 1522  vancomycin variable dose per unstable renal function (pharmacist dosing)  Status:  Discontinued         Does not apply See admin instructions 12/11/20 1522 12/12/20 1607   12/11/20 0900  vancomycin (VANCOREADY) IVPB 1000 mg/200 mL  Status:  Discontinued        1,000 mg 200 mL/hr over 60 Minutes Intravenous Every 12 hours 12/11/20 0813 12/11/20 1522   12/10/20 1515  meropenem (MERREM) 1 g in sodium chloride 0.9 % 100 mL IVPB        1 g 200 mL/hr over 30 Minutes Intravenous Every 8 hours  12/10/20 1418 12/12/20 0834   12/09/20 1000  ceFEPIme (MAXIPIME) 2 g in sodium chloride 0.9 % 100 mL IVPB  Status:  Discontinued        2 g 200 mL/hr over 30 Minutes Intravenous Every 8 hours 12/09/20 0811 12/10/20 1418   12/09/20 0730  vancomycin (VANCOREADY) IVPB 2000 mg/400 mL        2,000 mg 200 mL/hr over 120 Minutes Intravenous  Once 12/09/20 0644 12/09/20 1616   12/09/20 0600  vancomycin (VANCOREADY) IVPB 1750 mg/350 mL  Status:  Discontinued        1,750 mg 175 mL/hr over 120 Minutes Intravenous Every 24 hours 12/08/20 0809 12/08/20 1229   12/08/20 2200  ceFEPIme (MAXIPIME) 2 g in sodium chloride 0.9 % 100 mL IVPB  Status:  Discontinued        2 g 200 mL/hr over 30 Minutes Intravenous Every 12 hours 12/08/20 0809 12/09/20 0811   12/08/20 1700  vancomycin (VANCOREADY) IVPB 1500 mg/300 mL  Status:  Discontinued        1,500 mg 150 mL/hr over 120 Minutes Intravenous Every 12 hours 12/08/20 0500 12/08/20 0809   12/08/20 1230  vancomycin variable dose per unstable renal function (pharmacist dosing)  Status:  Discontinued         Does not apply See admin instructions 12/08/20 1230 12/11/20 1359   12/08/20 0545  ceFEPIme (MAXIPIME) 2 g in sodium chloride 0.9 % 100 mL IVPB  Status:  Discontinued        2 g 200 mL/hr over 30 Minutes Intravenous Every 8 hours 12/08/20 0446 12/08/20 0809   12/08/20 0530  vancomycin (VANCOREADY) IVPB 2000 mg/400 mL       "Followed by" Linked Group Details   2,000 mg 200 mL/hr over 120 Minutes Intravenous  Once 12/08/20 0441 12/08/20 0815   12/08/20 0530  vancomycin (VANCOREADY) IVPB 500 mg/100 mL  Status:  Discontinued       "Followed by" Linked Group Details   500 mg 100 mL/hr over 60 Minutes Intravenous  Once 12/08/20 0441 12/08/20 0803   12/07/20 2215  piperacillin-tazobactam (ZOSYN) IVPB 3.375 g  Status:  Discontinued        3.375 g 12.5 mL/hr over 240 Minutes Intravenous Every 8 hours 12/07/20 2124 12/08/20 0447   12/07/20 1800  ceFEPIme (MAXIPIME) 2  g in sodium chloride 0.9 % 100 mL IVPB        2 g 200 mL/hr over 30 Minutes Intravenous  Once 12/07/20 1757 12/07/20 2036   12/07/20 1800  vancomycin (VANCOCIN) IVPB 1000 mg/200 mL premix        1,000 mg 200 mL/hr over 60 Minutes Intravenous  Once 12/07/20 1757 12/07/20 1942        Allergies Allergies  Allergen Reactions  . Shrimp (Diagnostic)     Experiences facial droop when eating seafood    Interim History / Subjective:  Back on PS overnight for increased WOB. Denies complaints.  Objective   Blood pressure (!) 100/56, pulse 66, temperature (!) 96.44 F (35.8 C), resp. rate (!) 21, height 6' 5.01" (1.956 m), weight 119.1 kg, SpO2 97 %.    Vent Mode: PSV FiO2 (%):  [40 %] 40 % Set Rate:  [18 bmp] 18 bmp Vt Set:  [500 mL] 500 mL PEEP:  [5 cmH20] 5 cmH20 Pressure Support:  [10 cmH20] 10 cmH20 Plateau Pressure:  [12 cmH20-15 cmH20] 12 cmH20   Intake/Output Summary (Last 24 hours) at 01/02/2021 0706 Last data filed at 01/02/2021 0600 Gross per 24 hour  Intake 554.7 ml  Output 6250 ml  Net -5695.3 ml   Filed Weights   12/31/20 0348 01/01/21 0419 01/02/21 0424  Weight: 121.3 kg 120.2 kg 119.1 kg   Constitutional: no acute distress  Eyes: tracking, pupils equal Ears, nose, mouth, and throat: trach in place with copious thick secretions Cardiovascular: RRR, ext warm Respiratory: crackles bases, triggering vent Gastrointestinal: soft, PEG in place Skin: No rashes, normal turgor Neurologic: quadriplegic Psychiatric: RASS 0   Pressure Injury 07/26/20 Sacrum Unstageable - Full thickness tissue loss in which the base of the injury is covered by slough (yellow, tan, gray, green or brown) and/or eschar (tan, brown or black) in the wound bed. (Active)  07/26/20 0513  Location: Sacrum  Location Orientation:   Staging: Unstageable - Full thickness tissue loss in which the base of the injury is covered by slough (yellow, tan, gray, green or brown) and/or eschar (tan, brown or  black) in the wound bed.  Wound Description (Comments):   Present on Admission: Yes     Pressure Injury 12/07/20 Heel Right (Active)  12/07/20 -- (present upon admission)  Location: Heel  Location Orientation: Right  Staging:   Wound Description (Comments):   Present on Admission: Yes     Pressure Injury 12/07/20 Heel Left (Active)  12/07/20 -- (present on admission)  Location: Heel  Location Orientation: Left  Staging:   Wound Description (Comments):   Present on Admission: Yes   Net even Potassium repleted Calcium slightly high question bone breaskdowbn Stable anemia Stable WBC/plts CBG okay CXR stable multifocal infiltrates  ASSESSMENT AND PLAN  SYNOPSIS: 70 yo quadriplegic with multiple medical issues with acute on chronic hypoxic resp failure due to severe polymicrobial sepsis and septic shockfrom pseudomonal infections of lungs and sacral decub. Failure to wean from vent.  Acute on chronic respiratory failure due to quadriplegia Recent episodes of septic shock due to multi resistant organisms Copious secretions and quadriplegia limiting weaning Continue TC during day as able  Chronic diastolic heart failure Tachybrady arrhythmias Grade II diastolic dysfunction LVEF 55 to 60% Arrhythmias likely neurogenic induced Maintain electrolyte balance Net neg 12.1L this admission, keep even to negative  Acute renal failure- resolved - Increase FWF  SEPTIC SHOCK - resolved PSEUDOMONAS AND MRSA infections Completed Cipro On vancomycin/cefepime ID following, defer  duration  ENDO ICU hyperglycemia/hypoglycemia protocol Sliding scale insulin as needed  Best practice (evaluated daily)  Diet: NPO is on tube feed Pain/Anxiety/Delirium protocol (if indicated): Yes (RASS goal 0) VAP protocol (if indicated): Yes DVT prophylaxis: Systemic AC GI prophylaxis: H2B Glucose control: SSI No Central venous access: Yes, and it is still needed PICC line right upper  extremity Arterial line: N/A Foley: Yes, and it is still needed, chronic need due to quadriplegia Mobility: bed rest PT consulted: N/A Last date of multidisciplinary goals of care discussion[N/A] Code Status: DNR status Disposition:ICU  Can he go home with home vent qHS and PRN?  Erskine Emery MD

## 2021-01-02 NOTE — Progress Notes (Signed)
Nutrition Follow Up Note   DOCUMENTATION CODES:   Not applicable  INTERVENTION:   Continue Pivot 1.5 @ goal rate of 38m/hr   Free water flushes 2026mq4 hrs per MD  Regimen provides 2700kcal/day, 169g/day protein and 256680may free water   Juven Fruit Punch BID via tube, each serving provides 95kcal and 2.5g of protein (amino acids glutamine and arginine)  NUTRITION DIAGNOSIS:   Inadequate oral intake related to dysphagia as evidenced by NPO status (pt with chronic PEG).  GOAL:   Provide needs based on ASPEN/SCCM guidelines  - met with tube feeds   MONITOR:   Vent status,Skin,TF tolerance,Weight trends,Labs,I & O's  ASSESSMENT:   [age]o male with h/o HTN, quadriplegia secondary to fall and chronic tracheostomy and PEG tube who is admitted with UTI, sepsis and mucous plugging   Pt able to tolerate trach collar during the day with ventilator at night. PEG tube in place. Pt tolerating tube feeds at goal rate. Per chart, pt appears fairly weight stable since admit.   Enteral Access: 20 Fr. PEG-tube placed 05/04/20  Medications reviewed and include: vitamin C, ferrous sulfate, insulin, florastor, simethicone, zinc, cefepime, pepcid, vancomycin   Labs reviewed: Na 143 wnl, K 4.1 wnl, BUN 40(H), P 2.7 wnl, Mg 2.4 wnl Hgb 7.7(L), Hct 24.2(L) cbgs- 115, 126 x 24 hrs  UOP- 6250m9mDiet Order:    Diet Order            Diet NPO time specified  Diet effective now                EDUCATION NEEDS:   No education needs have been identified at this time  Skin:  Skin Assessment: Reviewed RN Assessment (Right gluteal, extends to hip: 1 cm x 1 cm x 0.2 cm, Sacrum: 2.5 cm x 2 cm x 1.5 cm, Right heel:  2.7 cm x 2.4 cm x 0.1 cm, Left heel: 4.5 cm x 5.6 cm x 0.1 cm)  Last BM:  4/11- TYPE 6  Height:   Ht Readings from Last 1 Encounters:  12/28/20 6' 5.01" (1.956 m)    Weight:   Wt Readings from Last 1 Encounters:  01/02/21 119.1 kg    Ideal Body Weight:  94.5  kg  BMI:  Body mass index is 31.13 kg/m.  Estimated Nutritional Needs:   Kcal:  2700-3000kcal/day  Protein:  140-165 grams  Fluid:  2.6-2.9L/day  CaseKoleen Distance RD, LDN Please refer to [facility] and/or RD on-call/weekend/after hours pager

## 2021-01-02 NOTE — Consult Note (Addendum)
[facility] for Electrolyte Monitoring and Replacement   Recent Labs: Potassium (mmol/L)  Date Value  01/02/2021 4.1   Magnesium (mg/dL)  Date Value  01/02/2021 2.4   Calcium (mg/dL)  Date Value  01/02/2021 10.8 (H)   Albumin (g/dL)  Date Value  01/01/2021 2.8 (L)  02/01/2020 4.4   Phosphorus (mg/dL)  Date Value  01/02/2021 2.7   Sodium (mmol/L)  Date Value  01/02/2021 143  02/01/2020 143    Assessment: Patient is a 70 y/o M presented with sepsis. Pt presented with complaints of recurrent UTI and hematuria. 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 Pt has chronic tracheostomy and PEG tube. PMH includes HTN and HLD. Pharmacy was consulted to assist with electrolyte monitoring and replacement as indicated. He is being followed by cardiology and had noted episodes of bradycardia, PSVT.  Nutrition: tube feeds @ 75 mL/hr + Juven 1 packet BID + free water 200 mL per tube every 4 hours   Goal of Therapy:  Potassium 4.0 - 5.1 mmol/L Magnesium 2.0 - 2.4 mg/dL All Other Electrolytes WNL  Plan:   Na 140>143 - MD increased free water to 300 mL per tube every 4 hours  No other electrolyte replacement needed at this time  Electrolyte consult per CCM monitoring, will sign off for now since pt being transferred to progressive cardiac unit  Sherilyn Banker, PharmD Pharmacy Resident  01/02/2021 7:00 AM

## 2021-01-02 NOTE — Progress Notes (Signed)
Patient more awake, conversant with daughters, wants full code, changed in chart.  Erskine Emery MD PCCM

## 2021-01-03 ENCOUNTER — Ambulatory Visit: Payer: BC Managed Care – PPO | Admitting: Internal Medicine

## 2021-01-03 LAB — BASIC METABOLIC PANEL
Anion gap: 6 (ref 5–15)
BUN: 45 mg/dL — ABNORMAL HIGH (ref 8–23)
CO2: 32 mmol/L (ref 22–32)
Calcium: 10.9 mg/dL — ABNORMAL HIGH (ref 8.9–10.3)
Chloride: 107 mmol/L (ref 98–111)
Creatinine, Ser: 0.71 mg/dL (ref 0.61–1.24)
GFR, Estimated: >60 mL/min (ref >60)
Glucose, Bld: 141 mg/dL — ABNORMAL HIGH (ref 70–99)
Potassium: 3.5 mmol/L (ref 3.5–5.1)
Sodium: 145 mmol/L (ref 135–145)

## 2021-01-03 LAB — GLUCOSE, CAPILLARY
Glucose-Capillary: 104 mg/dL — ABNORMAL HIGH (ref 70–99)
Glucose-Capillary: 117 mg/dL — ABNORMAL HIGH (ref 70–99)
Glucose-Capillary: 122 mg/dL — ABNORMAL HIGH (ref 70–99)
Glucose-Capillary: 133 mg/dL — ABNORMAL HIGH (ref 70–99)
Glucose-Capillary: 135 mg/dL — ABNORMAL HIGH (ref 70–99)
Glucose-Capillary: 135 mg/dL — ABNORMAL HIGH (ref 70–99)
Glucose-Capillary: 138 mg/dL — ABNORMAL HIGH (ref 70–99)

## 2021-01-03 LAB — MAGNESIUM: Magnesium: 2.3 mg/dL (ref 1.7–2.4)

## 2021-01-03 LAB — PHOSPHORUS: Phosphorus: 2.2 mg/dL — ABNORMAL LOW (ref 2.5–4.6)

## 2021-01-03 MED ORDER — IPRATROPIUM-ALBUTEROL 0.5-2.5 (3) MG/3ML IN SOLN
3.0000 mL | Freq: Two times a day (BID) | RESPIRATORY_TRACT | Status: DC
  Administered 2021-01-04 – 2021-01-05 (×3): 3 mL via RESPIRATORY_TRACT
  Filled 2021-01-03 (×3): qty 3

## 2021-01-03 MED ORDER — FREE WATER
300.0000 mL | Status: DC
  Administered 2021-01-03 – 2021-01-04 (×6): 300 mL

## 2021-01-03 MED ORDER — K PHOS MONO-SOD PHOS DI & MONO 155-852-130 MG PO TABS
500.0000 mg | ORAL_TABLET | Freq: Once | ORAL | Status: AC
  Administered 2021-01-03: 500 mg via ORAL
  Filled 2021-01-03: qty 2

## 2021-01-03 MED ORDER — FAMOTIDINE 20 MG PO TABS
20.0000 mg | ORAL_TABLET | Freq: Every day | ORAL | Status: DC
  Administered 2021-01-03 – 2021-01-04 (×2): 20 mg
  Filled 2021-01-03 (×2): qty 1

## 2021-01-03 MED ORDER — POTASSIUM CHLORIDE CRYS ER 20 MEQ PO TBCR
40.0000 meq | EXTENDED_RELEASE_TABLET | Freq: Once | ORAL | Status: AC
  Administered 2021-01-03: 40 meq via ORAL
  Filled 2021-01-03: qty 2

## 2021-01-03 MED ORDER — FENTANYL CITRATE (PF) 100 MCG/2ML IJ SOLN: 50.0000 ug | INTRAMUSCULAR | Status: DC | PRN

## 2021-01-03 NOTE — Progress Notes (Signed)
Neuro: stable at baseline  Resp: stable on trach collar, copious secretions CV: afebrile (foley temp probe not functioning), 1 GIGU: tolerating tube feeds well, minimal stool through flexiseal, foley in place with stable output Skin: Pt has multiple areas of concern. Sacral wound with unstageable areas to buttock and upper legs, also has wounds on bilateral heels. Pitting edema throughout. Social: Wife came to visit today, patient was interactive, all questions and concerns addressed.  Events: Transfer to 245, report given to floor RN.

## 2021-01-03 NOTE — Progress Notes (Signed)
Spoke to wife regarding patients transfer from ICU to 2A. She was under the impression that she would be contacted when / if patient was transferred. Patient was transferred from ICU to 245 on 4/13 without communication with family. Apologies were made, updated room number provided. Wife reports she will be at the hospital in the AM. Additional assistance offered, but declined at this time.

## 2021-01-03 NOTE — Consult Note (Addendum)
[facility] for Electrolyte Monitoring and Replacement   Recent Labs: Potassium (mmol/L)  Date Value  01/03/2021 3.5   Magnesium (mg/dL)  Date Value  01/03/2021 2.3   Calcium (mg/dL)  Date Value  01/03/2021 10.9 (H)   Albumin (g/dL)  Date Value  01/01/2021 2.8 (L)  02/01/2020 4.4   Phosphorus (mg/dL)  Date Value  01/03/2021 2.2 (L)   Sodium (mmol/L)  Date Value  01/03/2021 145  02/01/2020 143    Assessment: Patient is a 70 y/o M presented with sepsis. Pt presented with complaints of recurrent UTI and hematuria. 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 Pt has chronic tracheostomy and PEG tube. PMH includes HTN and HLD. Pharmacy was consulted to assist with electrolyte monitoring and replacement as indicated. He is being followed by cardiology and had noted episodes of bradycardia, PSVT.  Nutrition: tube feeds @ 75 mL/hr + Juven 1 packet BID + free water 200 mL per tube every 4 hours   Goal of Therapy:  Potassium 4.0 - 5.1 mmol/L Magnesium 2.0 - 2.4 mg/dL All Other Electrolytes WNL  Plan:   Na 145, continues to trend up - will continue free water 200 mL per tube every 4 hours and reassess in AM   K 3.5 - will give PO KCl 39mEq x1  Phos 2.2 - will give KPhos 500mg  x1 (contains 2.39mEq K)  No other electrolyte replacement needed at this time. Follow-up electrolytes with AM labs  Sherilyn Banker, PharmD Pharmacy Resident  01/03/2021 7:05 AM

## 2021-01-03 NOTE — TOC Progression Note (Addendum)
Transition of Care [facility]) - Progression Note    Patient Details  Name: Mart Colpitts MRN: [id number] Date of Birth: Aug 16, 1951  Transition of Care [facility]) CM/SW [place], [place] Phone Number: [phone] 01/03/2021, 11:19 AM  Clinical Narrative:     CSW called Ms. Kosanke to speak about LTACH and she asked if she could call CSW back later today.  Update: CSW spoke with Ms. Laham and she stated she would like for the patient to transfer to [place] contacted LTACH rep Jenn and updated her.  Danise Mina will begin insurance auth request.  Expected Discharge Plan: [place] Barriers to Discharge: Continued Medical Work up  Expected Discharge Plan and Services Expected Discharge Plan: [place] In-house Referral: Clinical Social Work   Post Acute Care Choice: Durable Medical Equipment Living arrangements for the past 2 months: Single Family Home                                       Social Determinants of Health (SDOH) Interventions    Readmission Risk Interventions No flowsheet data found.

## 2021-01-03 NOTE — Progress Notes (Signed)
Pharmacy Antibiotic Note  Ryan Day is a 70 y.o. male admitted on 12/07/2020 with sepsis. Pt presented with complaints of recurrent UTI and hematuria with foley cath last placed ~2 weeks ago, exchanged in ED. 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 Pt has PEG and trache. Pt admitted 11/22-12/5 for HCAP and sacral ulcer with osteomyelitis. Hx of enterobacter cloacae, pseudomonas in ucx. Pt has received vanc, Unasyn, Zosyn, and cefepime on recent previous admissions. ID consulted. Pt with polymicrobial bacteremia with sepsis resulting from multiple sources including UTI, PNA, and sacral wound. Meropenem was discontinued 3/22 based on sensitivity to ceftazidime from BAL. IV Cipro was added due to increased secretions, now d/c'd on 3/31 following 11 days of therapy. Pharmacy was consulted for vancomycin dosing. His creatinine has improved since admission, now stable ~0.7 (baseline ~0.5). Planning to complete 4 weeks of treatment   Day 27 abx  - no CBC today - SCr 0.72 with good UOP. SCr within range of recent values - Afeb, Tmin 97 - previous levels with therapeutic AUC on 4/9   Plan:  1) continue cefepime 2 grams IV every 8 hours    2) continue vancomycin 1000 mg IV every 24 hours. Follow up with level in 4-5 days or later if clinical appropriate.   Per ID pt will need 4 weeks of abx therapy. Will f/u duration and need to repeat vancomycin level   Height: 6' 5.01" (195.6 cm) Weight: 117.2 kg (258 lb 6.1 oz) IBW/kg (Calculated) : 89.12   Temp (24hrs), Avg:97.2 F (36.2 C), Min:96.08 F (35.6 C), Max:98.96 F (37.2 C)  Recent Labs  Lab 12/28/20 0338 12/29/20 0441 12/29/20 1519 12/30/20 0411 12/30/20 1040 12/31/20 0435 01/01/21 0419 01/02/21 0453 01/03/21 0455  WBC 6.5 6.0  --  6.3  --  8.4 7.5  --   --   CREATININE 0.71 0.70  --  0.56*  --  0.74 0.62 0.52* 0.71  VANCOTROUGH  --   --   --   --  12*  --   --   --   --   VANCOPEAK  --   --  25*   --   --   --   --   --   --     Estimated Creatinine Clearance: 125.4 mL/min (by C-G formula based on SCr of 0.71 mg/dL).    Allergies  Allergen Reactions  . Shrimp (Diagnostic)     Experiences facial droop when eating seafood    Antimicrobials this admission: 3/20 Meropenem >> 3/21  3/22 Cipro >> 3/31 3/17 Vancomycin  >>  3/17 Cefepime  >> 3/20  3/22 >>  Microbiology results: 3/17 MRSA PCR: positive 3/17 BCx: E cloacae, MRSA, E faecalis 3/17 UCx: E cloacae 3/18 Resp cx: PSA 3/18 BAL: PSA (ceftazidime sensitive), MRSA, E faecalis 3/21 Sacral cx: MRSA, PSA 3/23 BCx: NGTD 3/27 Respiratory Cx: PSA (gentamicin sensitive) - MDR PSA likely colonization - treating based on BAL susc.    Thank you for allowing pharmacy to be a part of this patient's care.  Doreene Eland, PharmD, BCPS.   Work Cell: [phone] 01/03/2021 10:09 AM

## 2021-01-03 NOTE — Progress Notes (Signed)
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 According to pts family last week pt noted to have sediment in his urine at home concerning for UTI. He had a foley catheter placed 2 weeks ago and completed a 7 day course of abx therapy due to UTI. Due to concerns of recurrent UTI pts family contacted pts PCP office last week, and heard from his PCP this week and were awaiting urine culture results. However, pt noted to have hematuria yesterday and today along with sinus tachycardia hr 120's. Pts daughter is a Marine scientist and due to concern of sepsis pt transported to the ER via EMS for further evaluation and treatment.    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 C5 Cervical Fracture (following a mechanical fall at home) s/p C3-6 Laminectomy and Posterior Instrumentation and Fusion 08/4  Quadriplegia  Spinal Cord Compression  Chronic Tracheostomy and PEG   Significant Hospital Events: Including procedures, antibiotic start and stop dates in addition to other pertinent events   03/17: Pt admitted to ICU with urosepsis requiring vasopressors and acute on chronic hypoxic respiratory failure secondary to mucous plugging requiring exchange of cuffless trach to size 6 mm cuffed trach to be placed on mechanical ventilation  03/17: Right femoral central lineplaced by ER physician  03/17: CT Abd Pelvis findings with cystitis. Correlation with urinalysis is Recommended. Mild to moderate severity bibasilar atelectasis and/or Infiltrate. Sigmoid diverticulosis. Sacral decubitus ulcer, as described above. MRI correlation is recommended, as sequelae associated with acute osteomyelitis cannot be excluded. Aortic atherosclerosis. 03/17: CT Head revealed mild chronic ischemic  white matter disease. No acute intracranial abnormality seen 03/17: Vancomycin x1 dose and Cefepime x1 dose 03/17: Zosyn>>completed 03/27: On Cefepime 3/30severe bradycardia with decrease in PS mode 3/31 patient with increased WOB placed on full vent support 4/1remains on vent,+pneumonia, failure to wean from vent 4/2 severe resp distress, on full vent support 4/4patient failed another SBT he became bradycardic unresponsive still not able to communicate with providers wife is at the bedside and 2 daughters are still deciding about how to proceed with care 4/5wife and kids are by the bedside long discussion about prognosis they are still deciding what should happen. 4/6had a long discussion yesterday with the wife 2 daughters who are nurses in the room wife expressed thin desire for making him in comfort care in the medical meaning of comfort care due to her severe still not decided they needed some more time to decide which is the long plan forward was supposed to meet again today. 4/10 weaning off sedatives. Begin SBT again.Glycopyrrolate x 1 for copious secretions 4/11 tolerated TC for a few hours  Micro 12/07/2020 blood culture has methicillin-resistant staph aureus, Enterobacter cloacae and Enterococcus faecalis  12/13/2020 blood culture: No growth so far  12/08/2020  bronchoalveolar lavage more than 100,000 colonies of Pseudomonas aeruginosa Methicillin-resistant staph aureus 40,000 colonies Enterococcus faecalis 20,000.  12/07/2020 urine culture Enterobacter cloacae   Sacral wound has rare Pseudomonas aeruginosa rare methicillin-resistant staph aureus few Bacteroides and rare Candida albicans. Likely stool contamination       Medications Scheduled Meds: . ascorbic acid  500 mg Per Tube BID  . baclofen  10 mg Per Tube QID  . chlorhexidine gluconate (MEDLINE KIT)  15 mL Mouth Rinse BID  .  Chlorhexidine Gluconate Cloth  6 each Topical Daily  . famotidine  20 mg Per  Tube QHS  . ferrous sulfate  75 mg Per Tube Daily  . FLUoxetine  40 mg Per Tube Daily  . free water  300 mL Per Tube Q4H  . guaiFENesin  20 mL Oral Q6H  . insulin aspart  0-15 Units Subcutaneous Q4H  . ipratropium-albuterol  3 mL Nebulization Q6H  . mouth rinse  15 mL Mouth Rinse 10 times per day  . midodrine  5 mg Per Tube BID WC  . modafinil  100 mg Oral Daily  . nutrition supplement (JUVEN)  1 packet Per Tube BID BM  . polycarbophil  625 mg Per Tube Daily  . rivaroxaban  10 mg Per Tube Daily  . simethicone  80 mg Per Tube QID  . traZODone  50 mg Per Tube QHS   Continuous Infusions: . sodium chloride Stopped (01/03/21 0535)  . ceFEPime (MAXIPIME) IV 200 mL/hr at 01/03/21 0600  . feeding supplement (PIVOT 1.5 CAL) 1,000 mL (01/02/21 2244)  . vancomycin 1,000 mg (01/03/21 1156)   PRN Meds:.sodium chloride, acetaminophen, bisacodyl, diclofenac Sodium, docusate sodium, fentaNYL (SUBLIMAZE) injection, glycopyrrolate, midazolam, morphine injection, ondansetron (ZOFRAN) IV, polyethylene glycol, polyvinyl alcohol, sodium chloride flush  Anti-infectives (From admission, onward)   Start     Dose/Rate Route Frequency Ordered Stop   12/26/20 1200  vancomycin (VANCOREADY) IVPB 1000 mg/200 mL        1,000 mg 200 mL/hr over 60 Minutes Intravenous Every 24 hours 12/26/20 1044     12/26/20 1100  vancomycin (VANCOREADY) IVPB 750 mg/150 mL  Status:  Discontinued        750 mg 150 mL/hr over 60 Minutes Intravenous Every 24 hours 12/26/20 0947 12/26/20 1000   12/26/20 1100  vancomycin (VANCOCIN) 750 mg in sodium chloride 0.9 % 250 mL IVPB  Status:  Discontinued        750 mg 250 mL/hr over 60 Minutes Intravenous Every 24 hours 12/26/20 1001 12/26/20 1044   12/25/20 1339  vancomycin variable dose per unstable renal function (pharmacist dosing)  Status:  Discontinued         Does not apply See admin instructions 12/25/20 1339 12/26/20 0947   12/20/20 2000  vancomycin (VANCOREADY) IVPB 500 mg/100 mL   Status:  Discontinued        500 mg 100 mL/hr over 60 Minutes Intravenous Every 12 hours 12/20/20 1709 12/25/20 1339   12/17/20 1400  vancomycin (VANCOREADY) IVPB 750 mg/150 mL  Status:  Discontinued        750 mg 150 mL/hr over 60 Minutes Intravenous Every 12 hours 12/17/20 1159 12/20/20 0604   12/13/20 1000  vancomycin (VANCOREADY) IVPB 1000 mg/200 mL  Status:  Discontinued        1,000 mg 200 mL/hr over 60 Minutes Intravenous Every 12 hours 12/13/20 0832 12/17/20 1159   12/12/20 1800  ciprofloxacin (CIPRO) IVPB 400 mg  Status:  Discontinued        400 mg 200 mL/hr over 60 Minutes Intravenous Every 8 hours 12/12/20 1620 12/21/20 1529   12/12/20 1700  ciprofloxacin (CIPRO) IVPB 400 mg  Status:  Discontinued        400 mg 200 mL/hr over 60 Minutes Intravenous Every 12 hours 12/12/20 1609 12/12/20 1620   12/12/20 1615  vancomycin variable dose per unstable renal function (pharmacist dosing)  Status:  Discontinued         Does not apply See admin instructions 12/12/20  1615 12/13/20 1050   12/12/20 1100  vancomycin (VANCOREADY) IVPB 1750 mg/350 mL        1,750 mg 175 mL/hr over 120 Minutes Intravenous  Once 12/12/20 0957 12/12/20 1301   12/12/20 0600  ceFEPIme (MAXIPIME) 2 g in sodium chloride 0.9 % 100 mL IVPB        2 g 200 mL/hr over 30 Minutes Intravenous Every 8 hours 12/11/20 1929     12/11/20 1522  vancomycin variable dose per unstable renal function (pharmacist dosing)  Status:  Discontinued         Does not apply See admin instructions 12/11/20 1522 12/12/20 1607   12/11/20 0900  vancomycin (VANCOREADY) IVPB 1000 mg/200 mL  Status:  Discontinued        1,000 mg 200 mL/hr over 60 Minutes Intravenous Every 12 hours 12/11/20 0813 12/11/20 1522   12/10/20 1515  meropenem (MERREM) 1 g in sodium chloride 0.9 % 100 mL IVPB        1 g 200 mL/hr over 30 Minutes Intravenous Every 8 hours 12/10/20 1418 12/12/20 0834   12/09/20 1000  ceFEPIme (MAXIPIME) 2 g in sodium chloride 0.9 % 100 mL  IVPB  Status:  Discontinued        2 g 200 mL/hr over 30 Minutes Intravenous Every 8 hours 12/09/20 0811 12/10/20 1418   12/09/20 0730  vancomycin (VANCOREADY) IVPB 2000 mg/400 mL        2,000 mg 200 mL/hr over 120 Minutes Intravenous  Once 12/09/20 0644 12/09/20 1616   12/09/20 0600  vancomycin (VANCOREADY) IVPB 1750 mg/350 mL  Status:  Discontinued        1,750 mg 175 mL/hr over 120 Minutes Intravenous Every 24 hours 12/08/20 0809 12/08/20 1229   12/08/20 2200  ceFEPIme (MAXIPIME) 2 g in sodium chloride 0.9 % 100 mL IVPB  Status:  Discontinued        2 g 200 mL/hr over 30 Minutes Intravenous Every 12 hours 12/08/20 0809 12/09/20 0811   12/08/20 1700  vancomycin (VANCOREADY) IVPB 1500 mg/300 mL  Status:  Discontinued        1,500 mg 150 mL/hr over 120 Minutes Intravenous Every 12 hours 12/08/20 0500 12/08/20 0809   12/08/20 1230  vancomycin variable dose per unstable renal function (pharmacist dosing)  Status:  Discontinued         Does not apply See admin instructions 12/08/20 1230 12/11/20 1359   12/08/20 0545  ceFEPIme (MAXIPIME) 2 g in sodium chloride 0.9 % 100 mL IVPB  Status:  Discontinued        2 g 200 mL/hr over 30 Minutes Intravenous Every 8 hours 12/08/20 0446 12/08/20 0809   12/08/20 0530  vancomycin (VANCOREADY) IVPB 2000 mg/400 mL       "Followed by" Linked Group Details   2,000 mg 200 mL/hr over 120 Minutes Intravenous  Once 12/08/20 0441 12/08/20 0815   12/08/20 0530  vancomycin (VANCOREADY) IVPB 500 mg/100 mL  Status:  Discontinued       "Followed by" Linked Group Details   500 mg 100 mL/hr over 60 Minutes Intravenous  Once 12/08/20 0441 12/08/20 0803   12/07/20 2215  piperacillin-tazobactam (ZOSYN) IVPB 3.375 g  Status:  Discontinued        3.375 g 12.5 mL/hr over 240 Minutes Intravenous Every 8 hours 12/07/20 2124 12/08/20 0447   12/07/20 1800  ceFEPIme (MAXIPIME) 2 g in sodium chloride 0.9 % 100 mL IVPB        2 g  200 mL/hr over 30 Minutes Intravenous  Once  12/07/20 1757 12/07/20 2036   12/07/20 1800  vancomycin (VANCOCIN) IVPB 1000 mg/200 mL premix        1,000 mg 200 mL/hr over 60 Minutes Intravenous  Once 12/07/20 1757 12/07/20 1942        Allergies Allergies  Allergen Reactions  . Shrimp (Diagnostic)     Experiences facial droop when eating seafood    Interim History / Subjective:  Patient tolerated trach collar for more than 24 hours, able to suction himself  Objective   Blood pressure 117/79, pulse 81, temperature (!) 97.2 F (36.2 C), temperature source Oral, resp. rate (!) 23, height 6' 5.01" (1.956 m), weight 117.2 kg, SpO2 96 %.    FiO2 (%):  [35 %-40 %] 35 %   Intake/Output Summary (Last 24 hours) at 01/03/2021 1349 Last data filed at 01/03/2021 1200 Gross per 24 hour  Intake 542.36 ml  Output 5575 ml  Net -5032.64 ml   Filed Weights   01/01/21 0419 01/02/21 0424 01/03/21 0500  Weight: 120.2 kg 119.1 kg 117.2 kg   Constitutional: Elderly African-American male, lying on the bed Eyes: tracking, pupils equal Ears, nose, mouth, and throat: trach in place with copious thick secretions Cardiovascular: RRR, ext warm, no murmur Respiratory: crackles bases, no wheezes Gastrointestinal: soft, PEG in place Skin: No rashes, normal turgor Neurologic: quadriplegic   Pressure Injury 07/26/20 Sacrum Unstageable - Full thickness tissue loss in which the base of the injury is covered by slough (yellow, tan, gray, green or brown) and/or eschar (tan, brown or black) in the wound bed. (Active)  07/26/20 0513  Location: Sacrum  Location Orientation:   Staging: Unstageable - Full thickness tissue loss in which the base of the injury is covered by slough (yellow, tan, gray, green or brown) and/or eschar (tan, brown or black) in the wound bed.  Wound Description (Comments):   Present on Admission: Yes     Pressure Injury 12/07/20 Heel Right (Active)  12/07/20 -- (present upon admission)  Location: Heel  Location Orientation:  Right  Staging:   Wound Description (Comments):   Present on Admission: Yes     Pressure Injury 12/07/20 Heel Left (Active)  12/07/20 -- (present on admission)  Location: Heel  Location Orientation: Left  Staging:   Wound Description (Comments):   Present on Admission: Yes   Net even Potassium repleted Calcium slightly high question bone breaskdowbn Stable anemia Stable WBC/plts CBG okay CXR stable multifocal infiltrates  ASSESSMENT AND PLAN  SYNOPSIS: 70 yo quadriplegic with multiple medical issues with acute on chronic hypoxic resp failure due to severe polymicrobial sepsis and septic shockfrom pseudomonal infections of lungs and sacral decub. Failure to wean from vent.  Acute on chronic respiratory failure due to quadriplegia Recent episodes of septic shock due to multi resistant organisms Continue TC, patient was able to tolerate for more than 24 hours  Chronic diastolic heart failure Tachybrady arrhythmias Grade II diastolic dysfunction LVEF 55 to 60% Arrhythmias likely neurogenic induced Maintain electrolyte balance Net neg 12.1L this admission, keep even to negative  Acute renal failure- resolved Continue free water  SEPTIC SHOCK - resolved PSEUDOMONAS AND MRSA infections Completed Cipro On vancomycin/cefepime ID following, defer duration  ENDO ICU hyperglycemia/hypoglycemia protocol Sliding scale insulin as needed  Best practice (evaluated daily)  Diet: Tube feeds Pain/Anxiety/Delirium protocol (if indicated): Yes (RASS goal 0) VAP protocol (if indicated): N/A DVT prophylaxis: Systemic AC GI prophylaxis: H2B Glucose control: SSI No Central venous  access: Yes, and it is still needed PICC line right upper extremity Arterial line: N/A Foley: Yes, and it is still needed, chronic need due to quadriplegia Mobility: bed rest PT consulted: N/A Last date of multidisciplinary goals of care discussion Code Status: DNR  status Disposition:Progressive care   Jacky Kindle MD [facility] See Amion for pager If no response to pager, please call [phone] until 7pm After 7pm, Please call E-link [phone]

## 2021-01-04 ENCOUNTER — Other Ambulatory Visit: Payer: BC Managed Care – PPO | Admitting: Nurse Practitioner

## 2021-01-04 DIAGNOSIS — J9611 Chronic respiratory failure with hypoxia: Secondary | ICD-10-CM

## 2021-01-04 DIAGNOSIS — R609 Edema, unspecified: Secondary | ICD-10-CM

## 2021-01-04 LAB — BASIC METABOLIC PANEL
Anion gap: 7 (ref 5–15)
BUN: 43 mg/dL — ABNORMAL HIGH (ref 8–23)
CO2: 32 mmol/L (ref 22–32)
Calcium: 11.1 mg/dL — ABNORMAL HIGH (ref 8.9–10.3)
Chloride: 109 mmol/L (ref 98–111)
Creatinine, Ser: 0.58 mg/dL — ABNORMAL LOW (ref 0.61–1.24)
GFR, Estimated: >60 mL/min (ref >60)
Glucose, Bld: 110 mg/dL — ABNORMAL HIGH (ref 70–99)
Potassium: 3.8 mmol/L (ref 3.5–5.1)
Sodium: 148 mmol/L — ABNORMAL HIGH (ref 135–145)

## 2021-01-04 LAB — GLUCOSE, CAPILLARY
Glucose-Capillary: 143 mg/dL — ABNORMAL HIGH (ref 70–99)
Glucose-Capillary: 129 mg/dL — ABNORMAL HIGH (ref 70–99)
Glucose-Capillary: 117 mg/dL — ABNORMAL HIGH (ref 70–99)
Glucose-Capillary: 104 mg/dL — ABNORMAL HIGH (ref 70–99)
Glucose-Capillary: 134 mg/dL — ABNORMAL HIGH (ref 70–99)
Glucose-Capillary: 131 mg/dL — ABNORMAL HIGH (ref 70–99)

## 2021-01-04 LAB — MAGNESIUM: Magnesium: 2.4 mg/dL (ref 1.7–2.4)

## 2021-01-04 LAB — PHOSPHORUS: Phosphorus: 3.1 mg/dL (ref 2.5–4.6)

## 2021-01-04 MED ORDER — FREE WATER
150.0000 mL | Status: DC
  Administered 2021-01-04 – 2021-01-05 (×7): 150 mL

## 2021-01-04 MED ORDER — PROSOURCE TF PO LIQD
45.0000 mL | Freq: Three times a day (TID) | ORAL | Status: DC
  Administered 2021-01-04 – 2021-01-05 (×3): 45 mL
  Filled 2021-01-04: qty 45

## 2021-01-04 MED ORDER — DEXTROSE 5 % IV SOLN: INTRAVENOUS | Status: AC

## 2021-01-04 MED ORDER — OSMOLITE 1.5 CAL PO LIQD
120.0000 mL | Freq: Four times a day (QID) | ORAL | Status: DC
  Administered 2021-01-04 – 2021-01-05 (×3): 120 mL

## 2021-01-04 MED ORDER — FREE WATER: 120.0000 mL | Freq: Four times a day (QID) | Status: DC

## 2021-01-04 NOTE — Progress Notes (Signed)
Daily Progress Note   Patient Name: Ryan Day       Date: 01/04/2021 DOB: January 14, 1951  Age: 70 y.o. MRN#: [id number] Attending Physician: Lorella Nimrod, MD Primary Care Physician: Valerie Roys, DO Admit Date: 12/07/2020  Reason for Consultation/Follow-up: Establishing goals of care  Subjective:  Chart Reviewed. Updates Received. Patient Assessed.   Mr. Houp is awake and alert.  He is able to engage in discussions with his wife and medical team.  Denies pain or discomfort.  Wife is at the bedside.  Daughter [name] later visited while at the bedside.  Detailed discussion and updates provided at the bedside.  Dee Sport and exercise psychologist) also at the bedside during my visit providing updates and discussion on their support.   Wife verbalizes her understanding of patient's current illness and poor prognosis.  She speaks to wishes for him not to suffer or to prolong his life.  She shares her experience since patient has been hospitalized and is apologetic regarding not wishing to speak to palliative on prior occasions.  Support provided knowledge in the difficulty in conversations.  We discussed at length Wellington's full code status consideration of his current illness and comorbidities. Mrs. Thumma is her previous experience on 2 occasions when patient required CPR and other heroic measures.  She states she would not wish for him to undergo such traumatic experience again however she would like to further discuss with her family and patient making sure everyone is in full agreement.  She states the goal is not to resend the DNR again as he has recently been done. Recommendations provided and highly encouraged family to consider DNR as all expressed wishes align with this as they share wishes for him not to suffer, and to allowed a natural death when that occurs.   Wife speaks to wishes for patient to be at peace and for his suffering to end. She speaks to their strong faith and hopes that God will take  him when he see "fit". Emotional support provided.   Dee with [employer] provided education on outpatient hospice and hospice facility. Education provided on referral process and goals of care. Wife is not prepared to make any final decisions or to focus solely on patient's comfort by discontinuing treatments or interventions. She shares her emotional stress with decision making and watching patient in such state. If patient was to further decline wife would lean more to focus on his comfort. She is relying on support of her daughters. Emotional support provided.   At this time goals are clearly expressed by family to continue with current treatments as family will continue with ongoing discussions regarding code status and comfort.   Length of Stay: 28 days  Vital Signs: BP (!) 144/71 (BP Location: Left Wrist)   Pulse 68   Temp (!) 97.4 F (36.3 C) (Oral)   Resp 14   Ht 6' 5.01" (1.956 m)   Wt 121.4 kg   SpO2 96%   BMI 31.73 kg/m  SpO2: SpO2: 96 % O2 Device: O2 Device: Tracheostomy Collar O2 Flow Rate: O2 Flow Rate (L/min): 5 L/min  Physical Exam: Awake, Alert and oriented, ill appearing  Tracheostomy in place with secretions RRR Will follow commands       Palliative Care Assessment & Plan   Goals of Care/Recommendations:  Full Code, ongoing family discussions  Wife speaks to him wishes for patient not to suffer or life to be prolonged.  She is emotional expressing she would not want him to live  in his current state forever and if suffering would want to focus more on his comfort.  Although her discussion is realistic and appropriate she also is not ready to transition care to focus on comfort with elimination of medical interventions. She states plans and hopes are for [facility] unless patient has decline.   Wife is appreciative of follow-up support and is very much open for continued palliative support at this time.  PMT will continue to support and follow as needed.  I will be  off service after today and will have my colleague follow-up with family on Tuesday.   Prognosis: POOR   Discharge Planning: To Be Determined  Thank you for allowing the Palliative Medicine Team to assist in the care of this patient.  Time Total: 45 min.   Visit consisted of counseling and education dealing with the complex and emotionally intense issues of symptom management and palliative care in the setting of serious and potentially life-threatening illness.Greater than 50%  of this time was spent counseling and coordinating care related to the above assessment and plan.  Alda Lea, AGPCNP-BC  Palliative Medicine Team [phone]

## 2021-01-04 NOTE — Progress Notes (Signed)
Nutrition Follow Up Note   DOCUMENTATION CODES:   Not applicable  INTERVENTION:   Change to Osmolite 1.5- 2 cans QID via tube- (0800, 1200, 1600, 2000)- Flush with 60ml of water before and after each feed  Pro-Source 45ml TID via tube, provides 40kcal and 11g of protein per serving   Additional free water flushes 150ml q4 hrs   Regimen provides 2960kcal/day, 152g/day protein and 2828ml/day free water   Juven Fruit Punch BID via tube, each serving provides 95kcal and 2.5g of protein (amino acids glutamine and arginine)  NUTRITION DIAGNOSIS:   Inadequate oral intake related to dysphagia as evidenced by NPO status (pt with chronic PEG).  GOAL:   Patient will meet greater than or equal to 90% of their needs  - met with tube feeds   MONITOR:   Labs,Weight trends,TF tolerance,Skin,I & O's  ASSESSMENT:   69 y/o male with h/o HTN, quadriplegia secondary to fall and chronic tracheostomy and PEG tube who is admitted with UTI, sepsis and mucous plugging   Pt transferred out to the progressive care unit. Pt continues to tolerate tube feeds well via PEG. Will begin to transition patient back to his home tube feed regimen today. Pt with mild hypernatremia and initiated on 5% dextrose. New tube feed regimen will provide additional free water; need to make sure patient can tolerate home regimen and maintain normal sodium levels prior to discharge. Palliative care is following. Per chart, pt is up ~16lbs since admit.   Enteral Access: 20 Fr. PEG-tube placed 05/04/20  Medications reviewed and include: vitamin C, pepcid, ferrous sulfate, insulin, juven, fibercon,  simethicone, cefepime, vancomycin, 5% dextrose @100ml/hr  Labs reviewed: Na 148(H), K 3.8 wnl, BUN 43(H), P 3.1 wnl, Mg 2.4 wnl cbgs- 143, 129 x 24 hrs  Diet Order:    Diet Order            Diet NPO time specified  Diet effective now                EDUCATION NEEDS:   No education needs have been identified at this  time  Skin:  Skin Assessment: Reviewed RN Assessment (Right gluteal, extends to hip: 1 cm x 1 cm x 0.2 cm, Sacrum: 2.5 cm x 2 cm x 1.5 cm, Right heel:  2.7 cm x 2.4 cm x 0.1 cm, Left heel: 4.5 cm x 5.6 cm x 0.1 cm)  Last BM:  4/13- type 7  Height:   Ht Readings from Last 1 Encounters:  12/28/20 6' 5.01" (1.956 m)    Weight:   Wt Readings from Last 1 Encounters:  01/04/21 121.4 kg    Ideal Body Weight:  94.5 kg  BMI:  Body mass index is 31.73 kg/m.  Estimated Nutritional Needs:   Kcal:  2700-3000kcal/day  Protein:  140-165 grams  Fluid:  2.6-2.9L/day  Casey Campbell MS, RD, LDN Please refer to AMION for RD and/or RD on-call/weekend/after hours pager 

## 2021-01-04 NOTE — Progress Notes (Signed)
PROGRESS NOTE  Complicated PCCM transfer with long LOS  Ryan Day  ZRA:[id number] DOB: 1950/10/01 DOA: 12/07/2020 PCP: Valerie Roys, DO   Brief Narrative: Taken from prior notes. 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 ER on 03/17 with hematuria. According to pts family last week pt noted to have sediment in his urine at home concerning for UTI. He had a foley catheter placed 2 weeks ago and completed a 7 day course of abx therapy due to UTI. Due to concerns of recurrent UTI pts family contacted pts PCP office last week, and heard from his PCP this week and were awaiting urine culture results. However, pt noted to have hematuria yesterday and today along with sinus tachycardia hr 120's. Pts daughter is a Marine scientist and due to concern of sepsis pt transported to the ER via EMS for further evaluation and treatment.   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 C5 Cervical Fracture (following a mechanical fall at home) s/p C3-6 Laminectomy and Posterior Instrumentation and Fusion 08/4  Quadriplegia  Spinal Cord Compression  Chronic Tracheostomy and PEG   Significant Hospital Events: Including procedures, antibiotic start and stop dates in addition to other pertinent events   03/17: Pt admitted to ICU with urosepsis requiring vasopressors and acute on chronic hypoxic respiratory failure secondary to mucous plugging requiring exchange of cuffless trach to size 6 mm cuffed trach to be placed on mechanical ventilation  03/17: Right femoral central lineplaced by ER physician  03/17: CT Abd Pelvis findings with cystitis. Correlation with urinalysis is Recommended. Mild to moderate severity bibasilar atelectasis and/or Infiltrate. Sigmoid diverticulosis. Sacral decubitus ulcer, as described above. MRI correlation is recommended, as sequelae associated with acute osteomyelitis cannot be  excluded. Aortic atherosclerosis. 03/17: CT Head revealed mild chronic ischemic white matter disease. No acute intracranial abnormality seen 03/17: Vancomycin x1 dose and Cefepime x1 dose 03/17: Zosyn>>completed 03/27: On Cefepime 3/30severe bradycardia with decrease in PS mode 3/31 patient with increased WOB placed on full vent support 4/1remains on vent,+pneumonia, failure to wean from vent 4/2 severe resp distress, on full vent support 4/4patient failed another SBT he became bradycardic unresponsive still not able to communicate with providers wife is at the bedside and 2 daughters are still deciding about how to proceed with care 4/5wife and kids are by the bedside long discussion about prognosis they are still deciding what should happen. 4/6had a long discussion yesterday with the wife 2 daughters who are nurses in the room wife expressed thin desire for making him in comfort care in the medical meaning of comfort care due to her severe still not decided they needed some more time to decide which is the long plan forward was supposed to meet again today. 4/10 weaning off sedatives. Begin SBT again.Glycopyrrolate x 1 for copious secretions 4/11 tolerated TC for a few hours. 4/14.  [facility] to resume care  Micro 12/07/2020 blood culture has methicillin-resistant staph aureus, Enterobacter cloacae and Enterococcus faecalis  12/13/2020 blood culture: No growth so far  12/08/2020  bronchoalveolar lavage more than 100,000 colonies of Pseudomonas aeruginosa Methicillin-resistant staph aureus 40,000 colonies Enterococcus faecalis 20,000.  12/07/2020 urine culture Enterobacter cloacae  Sacral wound has rare Pseudomonas aeruginosa rare methicillin-resistant staph aureus few Bacteroides and rare Candida albicans. Likely stool contamination   Subjective: Patient was lying comfortably when seen today.  Wife at bedside.  He was feeling very lethargic and hungry, apparently no feeding was  resumed since he was transferred out of ICU last night.  Trach and PEG in place. Wife wants to reconsult palliative care and thinking about hospice now.  Assessment & Plan:   Active Problems:   Sepsis ([place])  Severe polymicrobial sepsis with septic shock.  Multiorganism and mostly resistant, sources urine, pseudomonal infections of lungs and sacral decubitus wounds. Pseudomonal and MRSA infections-ID was involved and will determine the duration of antibiotics.  Currently on cefepime and vancomycin.  Completed a course of Cipro. Septic shock resolved. -Currently per ID patient will need 4 weeks of antibiotic therapy. -Continue cefepime and vancomycin. -Appreciate their help. -Patient is very high risk for deterioration due to these multiple comorbidities, bedridden, chronic Foley, chronic tracheostomy and PEG tube and might require colostomy as decubitus wounds are getting infected from feces. -Palliative care was involved when he was in ICU, they were talking about comfort care but at that time wife wants to send him to [facility]. -Wife told me today that she is not considering hospice as she is getting tired of his recurrent hospital admissions and infections. -Reconsulted palliative.  Acute on chronic hypoxic respiratory failure.  Most likely secondary to severe sepsis with septic shock.  Patient was on vent, difficult to take off.  Now saturating well through 3 tracheostomy collar at 5 L and 28%. -Continue support through trach collar.  Chronic PEG. -Resume tube feeding-dietitian to restart home regimen.  Chronic diastolic heart failure.  Patient was experiencing tachybradycardia arrhythmias during his hospitalization.  EF of 55 to 60% with grade 2 diastolic dysfunction. Currently stable.  -Continue to monitor volume status.  Hypernatremia.  Sodium at 148.  Patient appears dry -Give him some D5. -Resume free water through tube. -Monitor sodium  Hypercalcemia.  May be secondary to his  chronic issues and excessive bone wasting. Do not see parathyroid levels in his chart. -Check parathyroid -Ionized calcium  Pressure injuries.  POA Pressure Injury 07/26/20 Sacrum Unstageable - Full thickness tissue loss in which the base of the injury is covered by slough (yellow, tan, gray, green or brown) and/or eschar (tan, brown or black) in the wound bed. (Active)  07/26/20 0513  Location: Sacrum  Location Orientation:   Staging: Unstageable - Full thickness tissue loss in which the base of the injury is covered by slough (yellow, tan, gray, green or brown) and/or eschar (tan, brown or black) in the wound bed.  Wound Description (Comments):   Present on Admission: Yes     Pressure Injury 12/07/20 Heel Right (Active)  12/07/20 -- (present upon admission)  Location: Heel  Location Orientation: Right  Staging:   Wound Description (Comments):   Present on Admission: Yes     Pressure Injury 12/07/20 Heel Left (Active)  12/07/20 -- (present on admission)  Location: Heel  Location Orientation: Left  Staging:   Wound Description (Comments):   Present on Admission: Yes    Objective: Vitals:   01/04/21 0759 01/04/21 0808 01/04/21 0840 01/04/21 1238  BP: 135/73   (!) 144/71  Pulse: 63   68  Resp: 20   14  Temp: (!) 97.4 F (36.3 C)   (!) 97.4 F (36.3 C)  TempSrc: Oral   Oral  SpO2: 99% 98% 98% 96%  Weight:      Height:        Intake/Output Summary (Last 24 hours) at 01/04/2021 1313 Last data filed at 01/04/2021 1055 Gross per 24 hour  Intake 11402.3 ml  Output 2025 ml  Net 9377.3 ml  Filed Weights   01/02/21 0424 01/03/21 0500 01/04/21 0418  Weight: 119.1 kg 117.2 kg 121.4 kg    Examination:  General exam: Chronically ill-appearing, obese gentleman, trach collar in place, in no acute distress. Respiratory system: Clear to auscultation. Respiratory effort normal. Cardiovascular system: S1 & S2 heard, RRR.  Gastrointestinal system: Soft, nontender,  nondistended, bowel sounds positive.  PEG tube in place Central nervous system: Alert and oriented.  Extremities: Trace LE edema, pulses intact and symmetrical. Psychiatry: Judgement and insight appear normal.    DVT prophylaxis: Xarelto Code Status: Full Family Communication: Wife was updated at bedside Disposition Plan:  Status is: Inpatient  Remains inpatient appropriate because:Inpatient level of care appropriate due to severity of illness   Dispo: The patient is from: Home              Anticipated d/c is to: To be determined              Patient currently is not medically stable to d/c.   Difficult to place patient No               Level of care: Progressive Cardiac  All the records are reviewed and case discussed with Care Management/Social Worker. Management plans discussed with the patient, nursing and they are in agreement.  Consultants:  PCCM Infectious disease Palliative care  Procedures:  Antimicrobials:  Cefepime Vancomycin  Data Reviewed: I have personally reviewed following labs and imaging studies  CBC: Recent Labs  Lab 12/29/20 0441 12/30/20 0411 12/31/20 0435 01/01/21 0419  WBC 6.0 6.3 8.4 7.5  NEUTROABS  --   --  6.0  --   HGB 7.6* 7.6* 7.8* 7.7*  HCT 24.6* 25.0* 25.0* 24.2*  MCV 92.8 92.9 90.6 89.6  PLT 224 196 193 824   Basic Metabolic Panel: Recent Labs  Lab 12/31/20 0435 01/01/21 0419 01/02/21 0453 01/03/21 0455 01/04/21 0640  NA 141 140 143 145 148*  K 3.9 3.6 4.1 3.5 3.8  CL 106 104 107 107 109  CO2 _0 32 32  GLUCOSE 121* 143* 129* 141* 110*  BUN 46* 38* 40* 45* 43*  CREATININE 0.74 0.62 0.52* 0.71 0.58*  CALCIUM 10.5* 10.4* 10.8* 10.9* 11.1*  MG 2.2 2.1 2.4 2.3 2.4  PHOS 2.6 2.5 2.7 2.2* 3.1   GFR: Estimated Creatinine Clearance: 127.5 mL/min (A) (by C-G formula based on SCr of 0.58 mg/dL (L)). Liver Function Tests: Recent Labs  Lab 01/01/21 0419  ALBUMIN 2.8*   No results for input(s): LIPASE, AMYLASE in the  last 168 hours. No results for input(s): AMMONIA in the last 168 hours. Coagulation Profile: No results for input(s): INR, PROTIME in the last 168 hours. Cardiac Enzymes: No results for input(s): CKTOTAL, CKMB, CKMBINDEX, TROPONINI in the last 168 hours. BNP (last 3 results) No results for input(s): PROBNP in the last 8760 hours. HbA1C: No results for input(s): HGBA1C in the last 72 hours. CBG: Recent Labs  Lab 01/03/21 2013 01/03/21 2325 01/04/21 0416 01/04/21 0800 01/04/21 1240  GLUCAP 135* 135* 143* 129* 117*   Lipid Profile: No results for input(s): CHOL, HDL, LDLCALC, TRIG, CHOLHDL, LDLDIRECT in the last 72 hours. Thyroid Function Tests: No results for input(s): TSH, T4TOTAL, FREET4, T3FREE, THYROIDAB in the last 72 hours. Anemia Panel: No results for input(s): VITAMINB12, FOLATE, FERRITIN, TIBC, IRON, RETICCTPCT in the last 72 hours. Sepsis Labs: No results for input(s): PROCALCITON, LATICACIDVEN in the last 168 hours.  No results found for this or any  previous visit (from the past 240 hour(s)).   Radiology Studies: No results found.  Scheduled Meds: . ascorbic acid  500 mg Per Tube BID  . baclofen  10 mg Per Tube QID  . chlorhexidine gluconate (MEDLINE KIT)  15 mL Mouth Rinse BID  . Chlorhexidine Gluconate Cloth  6 each Topical Daily  . famotidine  20 mg Per Tube QHS  . feeding supplement (OSMOLITE 1.5 CAL)  120 mL Per Tube QID  . feeding supplement (PROSource TF)  45 mL Per Tube TID  . ferrous sulfate  75 mg Per Tube Daily  . FLUoxetine  40 mg Per Tube Daily  . free water  120 mL Per Tube QID  . free water  150 mL Per Tube Q4H  . guaiFENesin  20 mL Oral Q6H  . insulin aspart  0-15 Units Subcutaneous Q4H  . ipratropium-albuterol  3 mL Nebulization BID  . mouth rinse  15 mL Mouth Rinse 10 times per day  . midodrine  5 mg Per Tube BID WC  . modafinil  100 mg Oral Daily  . nutrition supplement (JUVEN)  1 packet Per Tube BID BM  . polycarbophil  625 mg Per Tube  Daily  . rivaroxaban  10 mg Per Tube Daily  . simethicone  80 mg Per Tube QID  . traZODone  50 mg Per Tube QHS   Continuous Infusions: . sodium chloride 5 mL/hr at 01/04/21 0637  . ceFEPime (MAXIPIME) IV Stopped (01/04/21 0554)  . dextrose 100 mL/hr at 01/04/21 0950  . vancomycin Stopped (01/03/21 1533)     LOS: 28 days   Time spent: 50 minutes. More than 50% of the time was spent in counseling/coordination of care  Lorella Nimrod, MD [facility]  If 7PM-7AM, please contact night-coverage [email or web address]  01/04/2021, 1:13 PM   This record has been created using Systems analyst. Errors have been sought and corrected,but may not always be located. Such creation errors do not reflect on the standard of care.

## 2021-01-05 ENCOUNTER — Institutional Professional Consult (permissible substitution)
Admission: EM | Admit: 2021-01-05 | Discharge: 2021-01-31 | Disposition: A | Discharge status: Hospice | Payer: BC Managed Care – PPO | Source: Ambulatory Visit | Attending: Internal Medicine | Admitting: Internal Medicine

## 2021-01-05 ENCOUNTER — Other Ambulatory Visit (HOSPITAL_COMMUNITY): Payer: BC Managed Care – PPO

## 2021-01-05 DIAGNOSIS — R140 Abdominal distension (gaseous): Secondary | ICD-10-CM

## 2021-01-05 DIAGNOSIS — Z930 Tracheostomy status: Secondary | ICD-10-CM

## 2021-01-05 DIAGNOSIS — Z931 Gastrostomy status: Secondary | ICD-10-CM

## 2021-01-05 DIAGNOSIS — J189 Pneumonia, unspecified organism: Secondary | ICD-10-CM

## 2021-01-05 DIAGNOSIS — K567 Ileus, unspecified: Secondary | ICD-10-CM

## 2021-01-05 DIAGNOSIS — J9602 Acute respiratory failure with hypercapnia: Secondary | ICD-10-CM

## 2021-01-05 LAB — CBC
HCT: 22.8 % — ABNORMAL LOW (ref 39.0–52.0)
HCT: 26.8 % — ABNORMAL LOW (ref 39.0–52.0)
Hemoglobin: 7 g/dL — ABNORMAL LOW (ref 13.0–17.0)
Hemoglobin: 8.4 g/dL — ABNORMAL LOW (ref 13.0–17.0)
MCH: 28.9 pg (ref 26.0–34.0)
MCH: 28.7 pg (ref 26.0–34.0)
MCHC: 30.7 g/dL (ref 30.0–36.0)
MCHC: 31.3 g/dL (ref 30.0–36.0)
MCV: 94.2 fL (ref 80.0–100.0)
MCV: 91.5 fL (ref 80.0–100.0)
Platelets: 150 10*3/uL (ref 150–400)
Platelets: 192 10*3/uL (ref 150–400)
RBC: 2.42 MIL/uL — ABNORMAL LOW (ref 4.22–5.81)
RBC: 2.93 MIL/uL — ABNORMAL LOW (ref 4.22–5.81)
RDW: 16.5 % — ABNORMAL HIGH (ref 11.5–15.5)
RDW: 16.6 % — ABNORMAL HIGH (ref 11.5–15.5)
WBC: 5.2 10*3/uL (ref 4.0–10.5)
WBC: 6.5 10*3/uL (ref 4.0–10.5)
nRBC: 0 % (ref 0.0–0.2)
nRBC: 0 % (ref 0.0–0.2)

## 2021-01-05 LAB — BASIC METABOLIC PANEL
Anion gap: 8 (ref 5–15)
BUN: 33 mg/dL — ABNORMAL HIGH (ref 8–23)
CO2: 28 mmol/L (ref 22–32)
Calcium: 10.8 mg/dL — ABNORMAL HIGH (ref 8.9–10.3)
Chloride: 109 mmol/L (ref 98–111)
Creatinine, Ser: 0.67 mg/dL (ref 0.61–1.24)
GFR, Estimated: >60 mL/min (ref >60)
Glucose, Bld: 118 mg/dL — ABNORMAL HIGH (ref 70–99)
Potassium: 3.3 mmol/L — ABNORMAL LOW (ref 3.5–5.1)
Sodium: 145 mmol/L (ref 135–145)

## 2021-01-05 LAB — IRON AND TIBC
Iron: 69 ug/dL (ref 45–182)
Saturation Ratios: 27 % (ref 17.9–39.5)
TIBC: 256 ug/dL (ref 250–450)
UIBC: 187 ug/dL

## 2021-01-05 LAB — GLUCOSE, CAPILLARY
Glucose-Capillary: 119 mg/dL — ABNORMAL HIGH (ref 70–99)
Glucose-Capillary: 121 mg/dL — ABNORMAL HIGH (ref 70–99)
Glucose-Capillary: 135 mg/dL — ABNORMAL HIGH (ref 70–99)

## 2021-01-05 LAB — PREPARE RBC (CROSSMATCH)

## 2021-01-05 LAB — PHOSPHORUS: Phosphorus: 3.1 mg/dL (ref 2.5–4.6)

## 2021-01-05 LAB — PTH, INTACT AND CALCIUM
Calcium, Total (PTH): 11 mg/dL — ABNORMAL HIGH (ref 8.6–10.2)
PTH: 14 pg/mL — ABNORMAL LOW (ref 15–65)

## 2021-01-05 LAB — RETICULOCYTES
Immature Retic Fract: 26.1 % — ABNORMAL HIGH (ref 2.3–15.9)
RBC.: 2.71 MIL/uL — ABNORMAL LOW (ref 4.22–5.81)
Retic Count, Absolute: 53.4 10*3/uL (ref 19.0–186.0)
Retic Ct Pct: 2 % (ref 0.4–3.1)

## 2021-01-05 LAB — MAGNESIUM: Magnesium: 2.2 mg/dL (ref 1.7–2.4)

## 2021-01-05 LAB — VANCOMYCIN, TROUGH: Vancomycin Tr: 10 ug/mL — ABNORMAL LOW (ref 15–20)

## 2021-01-05 LAB — FERRITIN: Ferritin: 206 ng/mL (ref 24–336)

## 2021-01-05 LAB — VITAMIN B12: Vitamin B-12: 1376 pg/mL — ABNORMAL HIGH (ref 180–914)

## 2021-01-05 LAB — FOLATE: Folate: 16.9 ng/mL (ref >5.9)

## 2021-01-05 IMAGING — DX DG ABDOMEN 1V
1 series · 1 of 1 positions shown · non-contrast
Comparison: None.

CLINICAL DATA: Peg tube placement, 50 mL of Omni 350 injected.

EXAM:
ABDOMEN - 1 VIEW

[abdomen]
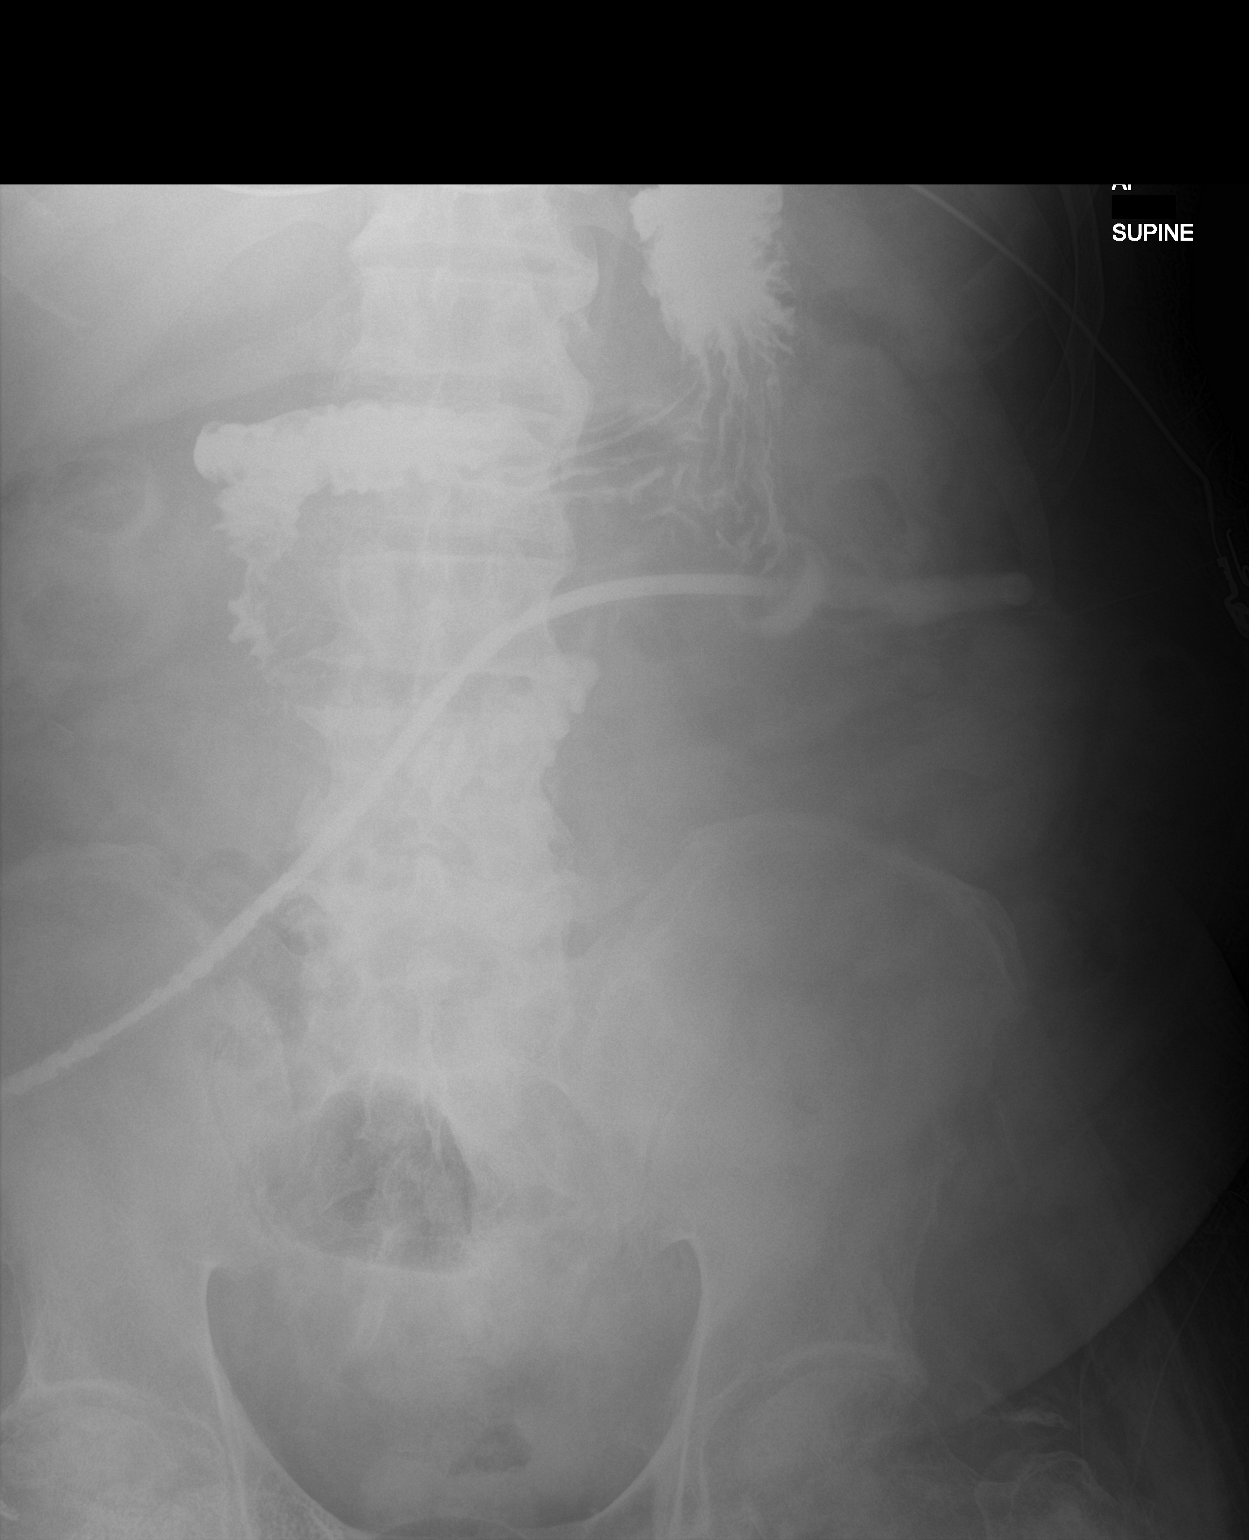

[1 of 1 positions shown; findings below may reference images not displayed]

FINDINGS: There is a PEG tube in the left hemiabdomen. There is contrast in
the expected location of the stomach and proximal small bowel.
Nonobstructive bowel gas pattern.
IMPRESSION: Contrast in the expected location of the stomach and proximal small
bowel following injection through the PEG tube.

## 2021-01-05 MED ORDER — SODIUM CHLORIDE 0.9 % IV SOLN: 2.0000 g | Freq: Three times a day (TID) | INTRAVENOUS | Status: AC

## 2021-01-05 MED ORDER — OSMOLITE 1.5 CAL PO LIQD: 474.0000 mL | Freq: Four times a day (QID) | ORAL | Status: DC

## 2021-01-05 MED ORDER — IOHEXOL 300 MG/ML  SOLN
50.0000 mL | Freq: Once | INTRAMUSCULAR | Status: AC | PRN
  Administered 2021-01-05: 50 mL

## 2021-01-05 MED ORDER — BACLOFEN 10 MG PO TABS: 10.0000 mg | ORAL_TABLET | Freq: Four times a day (QID) | ORAL | 0 refills | Status: AC

## 2021-01-05 MED ORDER — PROSOURCE TF PO LIQD: 45.0000 mL | Freq: Three times a day (TID) | ORAL | Status: AC

## 2021-01-05 MED ORDER — VANCOMYCIN HCL 1000 MG/200ML IV SOLN: 1000.0000 mg | INTRAVENOUS | Status: AC

## 2021-01-05 MED ORDER — SODIUM CHLORIDE 0.9% FLUSH: 10.0000 mL | INTRAVENOUS | Status: AC | PRN

## 2021-01-05 MED ORDER — IPRATROPIUM-ALBUTEROL 0.5-2.5 (3) MG/3ML IN SOLN: 3.0000 mL | Freq: Two times a day (BID) | RESPIRATORY_TRACT | Status: AC

## 2021-01-05 MED ORDER — OSMOLITE 1.5 CAL PO LIQD
237.0000 mL | Freq: Four times a day (QID) | ORAL | Status: DC
  Administered 2021-01-05: 237 mL

## 2021-01-05 MED ORDER — POLYVINYL ALCOHOL 1.4 % OP SOLN: 1.0000 [drp] | OPHTHALMIC | 0 refills | Status: AC | PRN

## 2021-01-05 MED ORDER — MODAFINIL 100 MG PO TABS: 100.0000 mg | ORAL_TABLET | Freq: Every day | ORAL | Status: AC

## 2021-01-05 MED ORDER — FREE WATER
120.0000 mL | Freq: Four times a day (QID) | Status: DC
  Administered 2021-01-05: 120 mL

## 2021-01-05 MED ORDER — DICLOFENAC SODIUM 1 % EX GEL: 4.0000 g | Freq: Four times a day (QID) | CUTANEOUS | Status: AC | PRN

## 2021-01-05 MED ORDER — DOCUSATE SODIUM 100 MG PO CAPS: 100.0000 mg | ORAL_CAPSULE | Freq: Two times a day (BID) | ORAL | 0 refills | Status: AC | PRN

## 2021-01-05 MED ORDER — JUVEN PO PACK: 1.0000 | PACK | Freq: Two times a day (BID) | ORAL | 0 refills | Status: AC

## 2021-01-05 MED ORDER — INSULIN ASPART 100 UNIT/ML ~~LOC~~ SOLN: 0.0000 [IU] | SUBCUTANEOUS | 11 refills | Status: AC

## 2021-01-05 MED ORDER — MIDODRINE HCL 5 MG PO TABS: 5.0000 mg | ORAL_TABLET | Freq: Two times a day (BID) | ORAL | Status: AC

## 2021-01-05 MED ORDER — OSMOLITE 1.5 CAL PO LIQD: 237.0000 mL | Freq: Four times a day (QID) | ORAL | 0 refills | Status: AC

## 2021-01-05 MED ORDER — OSMOLITE 1.5 CAL PO LIQD: 474.0000 mL | Freq: Four times a day (QID) | ORAL | 0 refills | Status: AC

## 2021-01-05 MED ORDER — ACETAMINOPHEN 160 MG/5ML PO SOLN: 320.0000 mg | ORAL | 0 refills | Status: AC | PRN

## 2021-01-05 MED ORDER — SODIUM CHLORIDE 0.9% IV SOLUTION: Freq: Once | INTRAVENOUS | Status: AC

## 2021-01-05 NOTE — Progress Notes (Signed)
Nutrition Brief Note  Home tube feed regimen:  Osmolite 1.5- Goal regimen will be 2 cartons (472ml) QID via tube- (0800, 1200, 1600, 2000)- Flush with 37ml of water before and after each feed  Advance as tolerated:   -4/15- provide 1 carton of Osmolite 1.5- 4 times daily via tube -4/16- provide 1 1/2 cartons of Osmolite 1.5- 4 times daily via tube -4/17- provide 2 cartons of Osmolite 1.5- 4 times daily via tube   Pro-Source 7ml TID via tube, provides 40kcal and 11g of protein per serving   Additional free water flushes of 184ml q4 hrs   Regimen provides 2960kcal/day, 152g/day protein and 2866ml/day free water   Juven Fruit Punch BID via tube, each serving provides 95kcal and 2.5g of protein (amino acids glutamine and arginine)  Koleen Distance MS, RD, LDN Please refer to [facility] for RD and/or RD on-call/weekend/after hours pager

## 2021-01-05 NOTE — Plan of Care (Signed)

## 2021-01-05 NOTE — Discharge Instructions (Signed)
[place] [facility] Stay  Provide Osmolite 1.5- Goal regimen will be 2 cartons (418ml) QID via tube- (0800, 1200, 1600, 2000)- Flush with 67ml of water before and after each feed  Advance as tolerated:   -4/15- provide 1 carton of Osmolite 1.5- 4 times daily via tube -4/16- provide 1 1/2 cartons of Osmolite 1.5- 4 times daily via tube -4/17- provide 2 cartons of Osmolite 1.5- 4 times daily via tube   Pro-Source 74ml TID via tube, provides 40kcal and 11g of protein per serving   Additional free water flushes of 130ml q4 hrs   Juven Fruit Punch BID via tube, each serving provides 95kcal and 2.5g of protein (amino acids glutamine and arginine)

## 2021-01-05 NOTE — Progress Notes (Signed)
Pharmacy Antibiotic Note  Arend Bahl is a 70 y.o. male admitted on 12/07/2020 with sepsis. Pt presented with complaints of recurrent UTI and hematuria with foley cath last placed ~2 weeks ago, exchanged in ED. 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 Pt has PEG and trache. Pt admitted 11/22-12/5 for HCAP and sacral ulcer with osteomyelitis. Hx of enterobacter cloacae, pseudomonas in ucx. Pt has received vanc, Unasyn, Zosyn, and cefepime on recent previous admissions. ID consulted. Pt with polymicrobial bacteremia with sepsis resulting from multiple sources including UTI, PNA, and sacral wound. Meropenem was discontinued 3/22 based on sensitivity to ceftazidime from BAL. IV Cipro was added due to increased secretions, now d/c'd on 3/31 following 11 days of therapy. Pharmacy was consulted for vancomycin dosing. His creatinine has improved since admission, now stable ~0.7 (baseline ~0.5). Planning to complete 4 weeks of treatment   Day 29 abx  - WBC WNL - SCr 0.67 with good UOP. SCr within range of recent values - Afeb, Tmin 97.5 - Patient colonized with MDR pseudomonas in respiratory cultures, has improved despite resistance to cefepime.  Susceptibilities sent to [facility] from 3/27 trach aspirate, susceptible to avycaz and zerbaxa  - previous levels (on 4/8 and 4/9) with therapeutic AUC on 4/9 with a trough = 12 mcg/ml - Today's vancomycin trough = 10 mcg/mL   Plan:  1) continue cefepime 2 grams IV every 8 hours    2) continue vancomycin 1000 mg IV every 24 hours as vancomycin trough similar to last week so AUC likely within goal 400-600.  Do not anticipate need for further levels as plan for vancomycin to stop 01/10/21   Per ID pt will need 4 weeks of abx therapy from negative blood culture on 3/23. This would place end date for IV antibiotics of 01/10/21  Height: 6' 5.01" (195.6 cm) Weight: 123.5 kg (272 lb 3.2 oz) IBW/kg (Calculated) : 89.12   Temp (24hrs),  Avg:98 F (36.7 C), Min:97.5 F (36.4 C), Max:98.9 F (37.2 C)  Recent Labs  Lab 12/29/20 1519 12/30/20 0411 12/30/20 0411 12/30/20 1040 12/31/20 0435 01/01/21 0419 01/02/21 0453 01/03/21 0455 01/04/21 0640 01/05/21 0730 01/05/21 0813 01/05/21 1130  WBC  --  6.3  --   --  8.4 7.5  --   --   --  5.2  --   --   CREATININE  --  0.56*   < >  --  0.74 0.62 0.52* 0.71 0.58*  --  0.67  --   VANCOTROUGH  --   --   --  12*  --   --   --   --   --   --   --  10*  VANCOPEAK 25*  --   --   --   --   --   --   --   --   --   --   --    < > = values in this interval not displayed.    Estimated Creatinine Clearance: 128.6 mL/min (by C-G formula based on SCr of 0.67 mg/dL).    Allergies  Allergen Reactions  . Shrimp (Diagnostic)     Experiences facial droop when eating seafood    Antimicrobials this admission: 3/20 Meropenem >> 3/21  3/22 Cipro >> 3/31 3/17 Vancomycin  >>  3/17 Cefepime  >> 3/20  3/22 >>  Microbiology results: 3/17 MRSA PCR: positive 3/17 BCx: E cloacae, MRSA, E faecalis 3/17 UCx: E cloacae 3/18 Resp cx:  PSA 3/18 BAL: PSA (ceftazidime sensitive), MRSA, E faecalis 3/21 Sacral cx: MRSA, PSA 3/23 BCx: NGTD 3/27 Respiratory Cx: PSA (gentamicin sensitive) - MDR PSA likely colonization - treating based on BAL susc.    Thank you for allowing pharmacy to be a part of this patient's care.  Doreene Eland, PharmD, BCPS.   Work Cell: [phone] 01/05/2021 1:24 PM

## 2021-01-05 NOTE — Discharge Summary (Signed)
Physician Discharge Summary  Ryan Day IOE:[id number] DOB: 07/19/1951 DOA: 12/07/2020  PCP: Ryan Roys, DO  Admit date: 12/07/2020 Discharge date: 01/05/2021  Admitted From: Home Disposition: LTAC-[facility] at [facility]  Recommendations for Outpatient Follow-up:  1. Follow up with PCP in 1-2 weeks 2. Please obtain BMP/CBC in one week 3. Please follow up on the following pending results: None  Home Health: No Equipment/Devices: N/A Discharge Condition: Stable CODE STATUS: Full Diet recommendation: Tube feeds as prescribed.  Brief/Interim Summary: Patient has a very complicated course of illness with extended length of stay.  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 According to pts family last week pt noted to have sediment in his urine at home concerning for UTI. He had a foley catheter placed 2 weeks ago and completed a 7 day course of abx therapy due to UTI. Due to concerns of recurrent UTI pts family contacted pts PCP office last week, and heard from his PCP this week and were awaiting urine culture results. However, pt noted to have hematuria yesterday and today along with sinus tachycardia hr 120's. Pts daughter is a Marine scientist and due to concern of sepsis pt transported to the ER via EMS for further evaluation and treatment.   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 C5 Cervical Fracture (following a mechanical fall at home) s/p C3-6 Laminectomy and Posterior Instrumentation and Fusion 08/4  Quadriplegia  Spinal Cord Compression  Chronic Tracheostomy and PEG   Significant Hospital Events: Including procedures, antibiotic start and stop dates in addition to other pertinent events   03/17: Pt admitted to ICU with urosepsis requiring vasopressors and acute on chronic hypoxic respiratory failure secondary to mucous plugging requiring exchange  of cuffless trach to size 6 mm cuffed trach to be placed on mechanical ventilation  03/17: Right femoral central lineplaced by ER physician  03/17: CT Abd Pelvis findings with cystitis. Correlation with urinalysis is Recommended. Mild to moderate severity bibasilar atelectasis and/or Infiltrate. Sigmoid diverticulosis. Sacral decubitus ulcer, as described above. MRI correlation is recommended, as sequelae associated with acute osteomyelitis cannot be excluded. Aortic atherosclerosis. 03/17: CT Head revealed mild chronic ischemic white matter disease. No acute intracranial abnormality seen 03/17: Vancomycin x1 dose and Cefepime x1 dose 03/17: Zosyn>>completed 03/27: On Cefepime 3/30severe bradycardia with decrease in PS mode 3/31 patient with increased WOB placed on full vent support 4/1remains on vent,+pneumonia, failure to wean from vent 4/2 severe resp distress, on full vent support 4/4patient failed another SBT he became bradycardic unresponsive still not able to communicate with providers wife is at the bedside and 2 daughters are still deciding about how to proceed with care 4/5wife and kids are by the bedside long discussion about prognosis they are still deciding what should happen. 4/6had a long discussion yesterday with the wife 2 daughters who are nurses in the room wife expressed thin desire for making him in comfort care in the medical meaning of comfort care due to her severe still not decided they needed some more time to decide which is the long plan forward was supposed to meet again today. 4/10weaning off sedatives. Begin SBT again.Glycopyrrolate x 1 for copious secretions 4/11 tolerated TC for a few hours. 4/14.  [facility] to resume care  Micro 12/07/2020 blood culture has methicillin-resistant staph aureus, Enterobacter cloacae and Enterococcus faecalis  12/13/2020 blood culture: No growth so far  12/08/2020  bronchoalveolar lavage more  than 100,000 colonies of  Pseudomonas aeruginosa Methicillin-resistant staph aureus 40,000 colonies Enterococcus faecalis 20,000.  12/07/2020 urine culture Enterobacter cloacae  Sacral wound has rare Pseudomonas aeruginosa rare methicillin-resistant staph aureus few Bacteroides and rare Candida albicans. Likely stool contamination   Patient with long hospitalization due to severe polymicrobial sepsis and septic shock. ID was involved and patient completed different course of antibiotics.  He is being discharged on cefepime and vancomycin which he will continue till 01/11/2021 and needs a infectious disease follow-up for further recommendations.  He might need suppressive therapy with p.o. meds after that.  Infectious disease will decide after completing current course of antibiotics.  Patient also required to be on ventilator due to acute on chronic hypoxic respiratory failure with septic shock.  Difficult to take off.  Currently saturating well through trach collar at 5 L and 28% which he will continue at [facility].  Patient will continue tube feeds through PEG tube as prescribed in AVS.  Patient was found to have hypercalcemia most likely secondary to increased bone turnover as parathyroid levels were appropriately low.  He will need good hydration. Rest of his electrolytes were repleted as needed and there was no need on the day of discharge.  Patient had multiple pressure injuries which were present on admission and as described below. Pressure Injury 07/26/20 Sacrum Unstageable - Full thickness tissue loss in which the base of the injury is covered by slough (yellow, tan, gray, green or brown) and/or eschar (tan, brown or black) in the wound bed. (Active)  07/26/20 0513  Location: Sacrum  Location Orientation:   Staging: Unstageable - Full thickness tissue loss in which the base of the injury is covered by slough (yellow, tan, gray, green or brown) and/or eschar (tan, brown or black) in the wound bed.  Wound  Description (Comments):   Present on Admission: Yes    Pressure Injury 12/07/20 Heel Right (Active)  12/07/20 -- (present upon admission)  Location: Heel  Location Orientation: Right  Staging:   Wound Description (Comments):   Present on Admission: Yes    Pressure Injury 12/07/20 Heel Left (Active)  12/07/20 -- (present on admission)  Location: Heel  Location Orientation: Left  Staging:   Wound Description (Comments):   Present on Admission: Yes   Patient will continue with wound care.  Patient was discharged with PICC line for continuation of IV antibiotics and a rectal tube to avoid contamination of his multiple sacral wounds.   Discharge Diagnoses:  Active Problems:   Acute respiratory failure with hypoxia and hypercapnia (HCC)   Tracheostomy dependent (HCC)   Septic shock (HCC)   Chronic respiratory failure with hypoxia (HCC)   Swelling   Abdominal distension   Discharge Instructions  Discharge Instructions    Diet - low sodium heart healthy   Complete by: As directed    Discharge wound care:   Complete by: As directed    Cleanse wounds to sacrum/Right gluteal and hip and bilateral heels with NS and pat gently dry.  Apply aquacel Ag to wound bed.  If wound beds are dry (heels), moisten Aquacel Ag with NS. Cover with dry gauze and ABD pad/tape or kerlix and tape (heels) .  Change Monday/Wednesday/Friday and PRN soilage.  Needs low air loss mattress if transferred to medical floor (ICU beds have low air loss feature) Prevalon boots to bilateral heels.  Turn and reposition every two hours.   Increase activity slowly   Complete by: As directed  Allergies as of 01/05/2021      Reactions   Shrimp (diagnostic)    Experiences facial droop when eating seafood      Medication List    STOP taking these medications   fludrocortisone 0.1 MG tablet Commonly known as: FLORINEF     TAKE these medications   acetaminophen 160 MG/5ML solution Commonly known as:  TYLENOL Place 10-20 mLs (320-640 mg total) into feeding tube every 4 (four) hours as needed for mild pain.   ascorbic acid 500 MG tablet Commonly known as: VITAMIN C Place 1 tablet (500 mg total) into feeding tube 2 (two) times daily.   baclofen 10 MG tablet Commonly known as: LIORESAL Place 1 tablet (10 mg total) into feeding tube 4 (four) times daily. What changed:   how to take this  when to take this   bisacodyl 10 MG suppository Commonly known as: DULCOLAX Place 1 suppository (10 mg total) rectally at bedtime.   ceFEPIme 2 g in sodium chloride 0.9 % 100 mL Inject 2 g into the vein every 8 (eight) hours for 6 days.   diclofenac Sodium 1 % Gel Commonly known as: Voltaren Apply 4 g topically 4 (four) times daily as needed (pain). What changed:   when to take this  reasons to take this   docusate sodium 100 MG capsule Commonly known as: COLACE Take 1 capsule (100 mg total) by mouth 2 (two) times daily as needed for mild constipation.   famotidine 20 MG tablet Commonly known as: PEPCID Place 1 tablet (20 mg total) into feeding tube daily.   ferrous sulfate 300 (60 Fe) MG/5ML syrup Place 1.3 mLs (78 mg total) into feeding tube daily.   FLUoxetine 20 MG/5ML solution Commonly known as: PROZAC Place 10 mLs (40 mg total) into feeding tube daily.   free water Soln Place 400 mLs into feeding tube every 4 (four) hours. What changed: how much to take   guaiFENesin 200 MG tablet Place 2 tablets (400 mg total) into feeding tube every 6 (six) hours.   insulin aspart 100 UNIT/ML injection Commonly known as: novoLOG Inject 0-15 Units into the skin every 4 (four) hours.   ipratropium-albuterol 0.5-2.5 (3) MG/3ML Soln Commonly known as: DUONEB Take 3 mLs by nebulization 2 (two) times daily.   lidocaine 5 % Commonly known as: LIDODERM Place 1 patch onto the skin daily. Remove & Discard patch within 12 hours or as directed by MD   midodrine 5 MG tablet Commonly known  as: PROAMATINE Place 1 tablet (5 mg total) into feeding tube 2 (two) times daily with a meal. What changed:   medication strength  how much to take  when to take this   modafinil 100 MG tablet Commonly known as: PROVIGIL Take 1 tablet (100 mg total) by mouth daily. Start taking on: January 06, 2021   nutrition supplement (JUVEN) Pack Place 1 packet into feeding tube 2 (two) times daily between meals. What changed:   Another medication with the same name was added. Make sure you understand how and when to take each.  Another medication with the same name was changed. Make sure you understand how and when to take each.   feeding supplement (PROSource TF) liquid Place 45 mLs into feeding tube 3 (three) times daily. What changed:   how much to take  when to take this   feeding supplement (OSMOLITE 1.5 CAL) Liqd Place 237 mLs into feeding tube 4 (four) times daily. What changed:   how much  to take  when to take this   feeding supplement (OSMOLITE 1.5 CAL) Liqd Place 474 mLs into feeding tube 4 (four) times daily. Start taking on: January 06, 2021 What changed: You were already taking a medication with the same name, and this prescription was added. Make sure you understand how and when to take each.   ondansetron 4 MG tablet Commonly known as: ZOFRAN Place 1 tablet (4 mg total) into feeding tube every 8 (eight) hours as needed for nausea, vomiting or refractory nausea / vomiting.   polycarbophil 625 MG tablet Commonly known as: FIBERCON Place 1 tablet (625 mg total) into feeding tube daily.   polyvinyl alcohol 1.4 % ophthalmic solution Commonly known as: LIQUIFILM TEARS Place 1 drop into both eyes as needed for dry eyes.   rivaroxaban 10 MG Tabs tablet Commonly known as: XARELTO Take one tablet per tube daily   saccharomyces boulardii 250 MG capsule Commonly known as: FLORASTOR Place 1 capsule (250 mg total) into feeding tube 2 (two) times daily.   scopolamine 1  MG/3DAYS Commonly known as: TRANSDERM-SCOP Place 1 patch onto the skin every 3 (three) days.   sennosides 8.8 MG/5ML syrup Commonly known as: SENOKOT Place 5 mLs into feeding tube daily at 6 (six) AM. What changed: when to take this   simethicone 40 MG/0.6ML drops Commonly known as: MYLICON Place 1.2 mLs (80 mg total) into feeding tube 4 (four) times daily. What changed: when to take this   sodium chloride flush 0.9 % Soln Commonly known as: NS 10-40 mLs by Intracatheter route as needed (flush).   traZODone 50 MG tablet Commonly known as: DESYREL Place 1 tablet (50 mg total) into feeding tube at bedtime.   vancomycin HCl 1000 MG/200ML Soln Commonly known as: VANCOREADY Inject 200 mLs (1,000 mg total) into the vein daily for 6 days.   zinc sulfate 220 (50 Zn) MG capsule Place 1 capsule (220 mg total) into feeding tube daily.            Discharge Care Instructions  (From admission, onward)         Start     Ordered   01/05/21 0000  Discharge wound care:       Comments: Cleanse wounds to sacrum/Right gluteal and hip and bilateral heels with NS and pat gently dry.  Apply aquacel Ag to wound bed.  If wound beds are dry (heels), moisten Aquacel Ag with NS. Cover with dry gauze and ABD pad/tape or kerlix and tape (heels) .  Change Monday/Wednesday/Friday and PRN soilage.  Needs low air loss mattress if transferred to medical floor (ICU beds have low air loss feature) Prevalon boots to bilateral heels.  Turn and reposition every two hours.   01/05/21 1237          Follow-up Information    Johnson, Megan P, DO. Schedule an appointment as soon as possible for a visit.   Specialty: Family Medicine Contact information: [place] [address] [phone]              Allergies  Allergen Reactions  . Shrimp (Diagnostic)     Experiences facial droop when eating seafood    Consultations:  PCCM  Infectious disease  Palliative  care  Procedures/Studies: DG Abd 1 View  Result Date: 12/22/2020 CLINICAL DATA:  Abdominal distension EXAM: ABDOMEN - 1 VIEW COMPARISON:  Abdomen and pelvis CT 12/07/2020 FINDINGS: Normal bowel gas pattern. Percutaneous gastrostomy tube. Normal bowel gas pattern.  No abnormal  stool retention. No acute finding in the lower chest. IMPRESSION: Normal bowel gas pattern. Electronically Signed   By: Monte Fantasia M.D.   On: 12/22/2020 09:56   CT Head Wo Contrast  Result Date: 12/07/2020 CLINICAL DATA:  Altered mental status. EXAM: CT HEAD WITHOUT CONTRAST TECHNIQUE: Contiguous axial images were obtained from the base of the skull through the vertex without intravenous contrast. COMPARISON:  None. FINDINGS: Brain: Mild chronic ischemic white matter disease is noted. No mass effect or midline shift is noted. Ventricular size is within normal limits. There is no evidence of mass lesion, hemorrhage or acute infarction. Vascular: No hyperdense vessel or unexpected calcification. Skull: Normal. Negative for fracture or focal lesion. Sinuses/Orbits: No acute finding. Other: None. IMPRESSION: Mild chronic ischemic white matter disease. No acute intracranial abnormality seen. Electronically Signed   By: Marijo Conception M.D.   On: 12/07/2020 20:44   CT ABDOMEN PELVIS W CONTRAST  Result Date: 12/07/2020 CLINICAL DATA:  Hematuria and weakness. EXAM: CT ABDOMEN AND PELVIS WITH CONTRAST TECHNIQUE: Multidetector CT imaging of the abdomen and pelvis was performed using the standard protocol following bolus administration of intravenous contrast. CONTRAST:  134m OMNIPAQUE IOHEXOL 300 MG/ML  SOLN COMPARISON:  None. FINDINGS: Lower chest: Mild to moderate severity areas of consolidation are seen within the bilateral lung bases. Hepatobiliary: No focal liver abnormality is seen. No gallstones, gallbladder wall thickening, or biliary dilatation. Pancreas: Unremarkable. No pancreatic ductal dilatation or surrounding inflammatory  changes. Spleen: Normal in size without focal abnormality. Adrenals/Urinary Tract: Adrenal glands are unremarkable. Kidneys are normal, without renal calculi, focal lesion, or hydronephrosis. A Foley catheter is seen within the urinary bladder. Diffuse urinary bladder wall thickening is also noted with a mild amount of surrounding inflammatory fat stranding. Stomach/Bowel: A percutaneous gastrostomy tube is seen with its distal tip and insufflator bulb noted within the body of the stomach. Appendix appears normal. No evidence of bowel wall thickening, distention, or inflammatory changes. Numerous diverticula are seen throughout the sigmoid colon. Vascular/Lymphatic: Aortic atherosclerosis. No enlarged abdominal or pelvic lymph nodes. Reproductive: Prostate is unremarkable. Other: No abdominal wall hernia or abnormality. No abdominopelvic ascites. Musculoskeletal: A 2.1 cm x 0.9 cm sacral decubitus ulcer is noted, to the left of midline. Multilevel degenerative changes seen throughout the lumbar spine. IMPRESSION: 1. Findings with cystitis. Correlation with urinalysis is recommended. 2. Mild to moderate severity bibasilar atelectasis and/or infiltrate. 3.   Sigmoid diverticulosis. 4. Sacral decubitus ulcer, as described above. MRI correlation is recommended, as sequelae associated with acute osteomyelitis cannot be excluded. 5. Aortic atherosclerosis. Aortic Atherosclerosis (ICD10-I70.0). Electronically Signed   By: TVirgina NorfolkM.D.   On: 12/07/2020 20:48   UKoreaVenous Img Upper Uni Right(DVT)  Result Date: 12/20/2020 CLINICAL DATA:  70year old male with swelling in the arm and presence of a PICC EXAM: RIGHT UPPER EXTREMITY VENOUS DOPPLER ULTRASOUND TECHNIQUE: Gray-scale sonography with graded compression, as well as color Doppler and duplex ultrasound were performed to evaluate the upper extremity deep venous system from the level of the subclavian vein and including the jugular, axillary, basilic, radial,  ulnar and upper cephalic vein. Spectral Doppler was utilized to evaluate flow at rest and with distal augmentation maneuvers. COMPARISON:  None. FINDINGS: Contralateral Subclavian Vein: Respiratory phasicity is normal and symmetric with the symptomatic side. No evidence of thrombus. Normal compressibility. Internal Jugular Vein: No evidence of thrombus. Normal compressibility, respiratory phasicity and response to augmentation. Subclavian Vein: No evidence of thrombus. Normal compressibility, respiratory phasicity and response to  augmentation. Axillary Vein: No evidence of thrombus. Normal compressibility, respiratory phasicity and response to augmentation. Cephalic Vein: No evidence of thrombus. Normal compressibility, respiratory phasicity and response to augmentation. Basilic Vein: No evidence of thrombus. Normal compressibility, respiratory phasicity and response to augmentation. Brachial Veins: No evidence of thrombus. Normal compressibility, respiratory phasicity and response to augmentation. Radial Veins: No evidence of thrombus. Normal compressibility, respiratory phasicity and response to augmentation. Ulnar Veins: No evidence of thrombus. Normal compressibility, respiratory phasicity and response to augmentation. Other Findings:  A PICC is present, incompletely visualized. IMPRESSION: Sonographic survey right upper extremity negative for DVT Electronically Signed   By: Corrie Mckusick D.O.   On: 12/20/2020 16:56   DG Chest Port 1 View  Result Date: 01/01/2021 CLINICAL DATA:  Acute respiratory failure EXAM: PORTABLE CHEST 1 VIEW COMPARISON:  12/24/2020 FINDINGS: Tracheostomy again identified. Right upper extremity PICC line is unchanged with its tip within the superior vena cava. Lung volumes are small, but are stable and are symmetric. Mild bilateral patchy pulmonary infiltrates are again identified and appear stable since prior examination, though are improved since remote prior examination of  12/12/2020 and are suggestive of a resolving infectious or inflammatory process. No pneumothorax or pleural effusion. Mild cardiomegaly is stable. No acute bone abnormality. IMPRESSION: Stable patchy bilateral pulmonary infiltrates, improved since remote prior examination and suggestive of a resolving infectious or inflammatory process. Stable support lines and tubes. Electronically Signed   By: Fidela Salisbury MD   On: 01/01/2021 03:36   DG Chest Port 1 View  Result Date: 12/24/2020 CLINICAL DATA:  70 year old male with respiratory failure, sepsis. EXAM: PORTABLE CHEST 1 VIEW COMPARISON:  Portable chest 12/17/2020 and earlier. FINDINGS: Portable AP semi upright view at 0737 hours. Stable tracheostomy, right PICC line. Larger lung volumes and regressed patchy perihilar opacity from last month. Stable cardiac size and mediastinal contours. No pneumothorax, pulmonary edema. No definite pleural effusion. No areas of worsening ventilation. Negative visible bowel gas. No acute osseous abnormality identified. IMPRESSION: 1. Improved lung volumes with regressed but not resolved perihilar opacity since 12/17/2020. Favor resolving infection. 2. No new cardiopulmonary abnormality. Electronically Signed   By: Genevie Ann M.D.   On: 12/24/2020 08:10   DG Chest Port 1 View  Result Date: 12/17/2020 CLINICAL DATA:  Respiratory failure.  Hypoxia. EXAM: PORTABLE CHEST 1 VIEW COMPARISON:  12/12/2020 FINDINGS: Right arm PICC line tip is in the distal SVC. Tracheostomy tube tip remains above the carina. Unchanged cardiac enlargement. Diffuse interstitial and airspace densities are unchanged compared with the previous exam. IMPRESSION: 1. No change in aeration a lungs compared with previous exam. 2. Stable support apparatus. Electronically Signed   By: Kerby Moors M.D.   On: 12/17/2020 13:47   DG Chest Port 1 View  Result Date: 12/12/2020 CLINICAL DATA:  Acute respiratory failure. EXAM: PORTABLE CHEST 1 VIEW COMPARISON:   12/09/2020. FINDINGS: Tracheostomy tube in stable position. Stable cardiomegaly. Low lung volumes with bibasilar atelectasis. Diffuse bilateral pulmonary infiltrates/edema again noted. Interim progression from prior exam. Small left pleural effusion. No pneumothorax. IMPRESSION: 1. Stable cardiomegaly. 2. Low lung volumes with bibasilar atelectasis. Diffuse bilateral pulmonary infiltrates/edema again noted. Interim progression from prior exam. Electronically Signed   By: Marcello Moores  Register   On: 12/12/2020 05:24   DG Chest Port 1 View  Result Date: 12/09/2020 CLINICAL DATA:  Acute respiratory failure EXAM: PORTABLE CHEST 1 VIEW COMPARISON:  12/07/2020 FINDINGS: Tracheostomy tube tip terminates in the mid to upper trachea in similar positioning to prior.  Telemetry leads and external support devices overlie the chest. Prior cervical fusion, incompletely assessed on this exam. There are increasing heterogeneous opacities throughout both lungs with associated airways thickening. No visible pneumothorax or layering effusion is seen. Cardiomediastinal contours are unchanged from prior counting for differences in technique. No acute osseous or soft tissue abnormality. Degenerative changes are present in the imaged spine and shoulders. IMPRESSION: Increasing heterogeneous opacities throughout both lungs with associated airways thickening, could reflect developing infection, edema, atelectasis or some combination there of. Electronically Signed   By: Lovena Le M.D.   On: 12/09/2020 03:24   DG Chest Portable 1 View  Result Date: 12/07/2020 CLINICAL DATA:  Weakness and tachycardia. EXAM: PORTABLE CHEST 1 VIEW COMPARISON:  Chest x-ray dated August 18, 2020. FINDINGS: Unchanged tracheostomy tube. Stable cardiomediastinal silhouette. Normal pulmonary vascularity. No focal consolidation, pleural effusion, or pneumothorax. No acute osseous abnormality. IMPRESSION: No active disease. Electronically Signed   By: Titus Dubin M.D.   On: 12/07/2020 17:30   ECHOCARDIOGRAM COMPLETE  Result Date: 12/09/2020    ECHOCARDIOGRAM REPORT   Patient Name:   Ryan Day Date of Exam: 12/09/2020 Medical Rec #:  [id number]   Height:       77.0 in Accession #:    [id number]  Weight:       276.0 lb Date of Birth:  12-09-1950    BSA:          2.565 m Patient Age:    70 years    BP:           108/56 mmHg Patient Gender: M           HR:           86 bpm. Exam Location:  [facility] Procedure: 2D Echo and Intracardiac Opacification Agent                                 MODIFIED REPORT:  This report was modified by Kate Sable MD on 06/21/2445 due to diastolic                                   dysfunction.  Indications:     Bacteremia R78.81  History:         Patient has prior history of Echocardiogram examinations, most                  recent 07/26/2020.  Sonographer:     Arville Go RDCS Referring Phys:  [id number] Tsosie Billing Diagnosing Phys: Kate Sable MD  Sonographer Comments: Technically difficult study due to poor echo windows, echo performed with patient supine and on artificial respirator and suboptimal subcostal window. Image acquisition challenging due to patient body habitus. IMPRESSIONS  1. Left ventricular ejection fraction, by estimation, is 55 to 60%. The left ventricle has normal function. The left ventricle has no regional wall motion abnormalities. There is mild left ventricular hypertrophy. Left ventricular diastolic parameters are consistent with Grade II diastolic dysfunction (pseudonormalization).  2. Right ventricular systolic function is normal. The right ventricular size is normal.  3. The mitral valve is normal in structure. No evidence of mitral valve regurgitation.  4. The aortic valve is grossly normal. Aortic valve regurgitation is not visualized. FINDINGS  Left Ventricle: Left ventricular ejection fraction, by estimation, is 55 to 60%. The left ventricle has normal function. The left ventricle  has no  regional wall motion abnormalities. Definity contrast agent was given IV to delineate the left ventricular  endocardial borders. The left ventricular internal cavity size was normal in size. There is mild left ventricular hypertrophy. Left ventricular diastolic parameters are consistent with Grade II diastolic dysfunction (pseudonormalization). Right Ventricle: The right ventricular size is normal. No increase in right ventricular wall thickness. Right ventricular systolic function is normal. Left Atrium: Left atrial size was normal in size. Right Atrium: Right atrial size was normal in size. Pericardium: There is no evidence of pericardial effusion. Mitral Valve: The mitral valve is normal in structure. No evidence of mitral valve regurgitation. Tricuspid Valve: The tricuspid valve is not well visualized. Tricuspid valve regurgitation is not demonstrated. Aortic Valve: The aortic valve is grossly normal. Aortic valve regurgitation is not visualized. Aortic valve peak gradient measures 11.6 mmHg. Pulmonic Valve: The pulmonic valve was not well visualized. Pulmonic valve regurgitation is not visualized. Aorta: The aortic root is normal in size and structure. Venous: The inferior vena cava was not well visualized. IAS/Shunts: No atrial level shunt detected by color flow Doppler.  LEFT VENTRICLE PLAX 2D LVIDd:         5.18 cm  Diastology LVIDs:         3.59 cm  LV e' medial:    6.53 cm/s LV PW:         1.29 cm  LV E/e' medial:  16.2 LV IVS:        1.20 cm  LV e' lateral:   8.70 cm/s LVOT diam:     2.10 cm  LV E/e' lateral: 12.2 LV SV:         86 LV SV Index:   33 LVOT Area:     3.46 cm  RIGHT VENTRICLE RV Basal diam:  2.67 cm RV S prime:     17.00 cm/s LEFT ATRIUM           Index       RIGHT ATRIUM           Index LA diam:      2.80 cm 1.09 cm/m  RA Area:     12.40 cm LA Vol (A4C): 27.2 ml 10.60 ml/m RA Volume:   29.40 ml  11.46 ml/m  AORTIC VALVE                PULMONIC VALVE AV Area (Vmax): 2.65 cm    PV Vmax:        0.81 m/s AV Vmax:        170.00 cm/s PV Peak grad:  2.7 mmHg AV Peak Grad:   11.6 mmHg LVOT Vmax:      130.00 cm/s LVOT Vmean:     84.300 cm/s LVOT VTI:       0.247 m  AORTA Ao Root diam: 3.40 cm Ao Asc diam:  3.30 cm MITRAL VALVE MV Area (PHT): 5.09 cm     SHUNTS MV Decel Time: 149 msec     Systemic VTI:  0.25 m MV E velocity: 106.00 cm/s  Systemic Diam: 2.10 cm MV A velocity: 96.80 cm/s MV E/A ratio:  1.10 Kate Sable MD Electronically signed by Kate Sable MD Signature Date/Time: 12/09/2020/2:15:29 PM    Final (Updated)    [place] EKG SITE RITE  Result Date: 12/12/2020 If Site Rite image not attached, placement could not be confirmed due to current cardiac rhythm.   Subjective: Patient was seen and examined today.  No new complaint.  He was  being fed.  [facility] for further management.  Denies any pain or shortness of breath.  Discharge Exam: Vitals:   01/05/21 1112 01/05/21 1127  BP:  (!) 167/84  Pulse:  75  Resp:  20  Temp: 97.6 F (36.4 C) 98.2 F (36.8 C)  SpO2:  97%   Vitals:   01/05/21 0754 01/05/21 1051 01/05/21 1112 01/05/21 1127  BP: 133/84 (!) 167/82  (!) 167/84  Pulse: 60 71  75  Resp: _0 Temp: 97.9 F (36.6 C)  97.6 F (36.4 C) 98.2 F (36.8 C)  TempSrc: Oral Oral Oral Oral  SpO2: 98% 98%  97%  Weight:      Height:        General: Pt is alert, awake, not in acute distress, trach collar in place. Cardiovascular: RRR, S1/S2 +, no rubs, no gallops Respiratory: CTA bilaterally, no wheezing, no rhonchi Abdominal: Soft, NT, ND, bowel sounds +, PEG tube in place Extremities: no edema, no cyanosis   The results of significant diagnostics from this hospitalization (including imaging, microbiology, ancillary and laboratory) are listed below for reference.    Microbiology: No results found for this or any previous visit (from the past 240 hour(s)).   Labs: BNP (last 3 results) Recent Labs    07/25/20 1308  08/14/20 1647 12/17/20 0623  BNP 72.8 59.5 161.0*   Basic Metabolic Panel: Recent Labs  Lab 01/01/21 0419 01/02/21 0453 01/03/21 0455 01/04/21 0640 01/04/21 1415 01/05/21 0813  NA 140 143 145 148*  --  145  K 3.6 4.1 3.5 3.8  --  3.3*  CL 104 107 107 109  --  109  CO2 31 31 32 32  --  28  GLUCOSE 143* 129* 141* 110*  --  118*  BUN 38* 40* 45* 43*  --  33*  CREATININE 0.62 0.52* 0.71 0.58*  --  0.67  CALCIUM 10.4* 10.8* 10.9* 11.1* 11.0* 10.8*  MG 2.1 2.4 2.3 2.4  --  2.2  PHOS 2.5 2.7 2.2* 3.1  --  3.1   Liver Function Tests: Recent Labs  Lab 01/01/21 0419  ALBUMIN 2.8*   No results for input(s): LIPASE, AMYLASE in the last 168 hours. No results for input(s): AMMONIA in the last 168 hours. CBC: Recent Labs  Lab 12/30/20 0411 12/31/20 0435 01/01/21 0419 01/05/21 0730  WBC 6.3 8.4 7.5 5.2  NEUTROABS  --  6.0  --   --   HGB 7.6* 7.8* 7.7* 7.0*  HCT 25.0* 25.0* 24.2* 22.8*  MCV 92.9 90.6 89.6 94.2  PLT 196 193 184 150   Cardiac Enzymes: No results for input(s): CKTOTAL, CKMB, CKMBINDEX, TROPONINI in the last 168 hours. BNP: Invalid input(s): POCBNP CBG: Recent Labs  Lab 01/04/21 2107 01/04/21 2327 01/05/21 0450 01/05/21 0756 01/05/21 1207  GLUCAP 134* 131* 119* 121* 135*   D-Dimer No results for input(s): DDIMER in the last 72 hours. Hgb A1c No results for input(s): HGBA1C in the last 72 hours. Lipid Profile No results for input(s): CHOL, HDL, LDLCALC, TRIG, CHOLHDL, LDLDIRECT in the last 72 hours. Thyroid function studies No results for input(s): TSH, T4TOTAL, T3FREE, THYROIDAB in the last 72 hours.  Invalid input(s): FREET3 Anemia work up Recent Labs    01/05/21 0816  FOLATE 16.9  FERRITIN 206  TIBC 256  IRON 69  RETICCTPCT 2.0   Urinalysis    Component Value Date/Time   COLORURINE YELLOW (A) 12/07/2020 1725  APPEARANCEUR TURBID (A) 12/07/2020 1725   APPEARANCEUR Cloudy (A) 12/07/2020 1055   LABSPEC 1.011 12/07/2020 1725   PHURINE  6.0 12/07/2020 1725   GLUCOSEU NEGATIVE 12/07/2020 1725   HGBUR MODERATE (A) 12/07/2020 1725   BILIRUBINUR NEGATIVE 12/07/2020 1725   BILIRUBINUR Negative 12/07/2020 1055   KETONESUR NEGATIVE 12/07/2020 1725   PROTEINUR 100 (A) 12/07/2020 1725   NITRITE NEGATIVE 12/07/2020 1725   LEUKOCYTESUR LARGE (A) 12/07/2020 1725   Sepsis Labs Invalid input(s): PROCALCITONIN,  WBC,  LACTICIDVEN Microbiology No results found for this or any previous visit (from the past 240 hour(s)).  Time coordinating discharge: Over 30 minutes  SIGNED:  Lorella Nimrod, MD  [facility] 01/05/2021, 1:03 PM  If 7PM-7AM, please contact night-coverage [email or web address]  This record has been created using Systems analyst. Errors have been sought and corrected,but may not always be located. Such creation errors do not reflect on the standard of care.

## 2021-01-05 NOTE — Care Management Important Message (Signed)
Important Message  Patient Details  Name: Ryan Day MRN: [id number] Date of Birth: [birth year]   Medicare Important Message Given:  Yes  Reviewed Medicare IM with daughter via room phone due to isolation status.  Will meet family outside patient's room to deliver copy for their reference.    Dannette Barbara 01/05/2021, 1:04 PM

## 2021-01-06 ENCOUNTER — Other Ambulatory Visit (HOSPITAL_COMMUNITY): Payer: BC Managed Care – PPO

## 2021-01-06 LAB — BASIC METABOLIC PANEL
Anion gap: 4 — ABNORMAL LOW (ref 5–15)
BUN: 36 mg/dL — ABNORMAL HIGH (ref 8–23)
CO2: 31 mmol/L (ref 22–32)
Calcium: 11.1 mg/dL — ABNORMAL HIGH (ref 8.9–10.3)
Chloride: 113 mmol/L — ABNORMAL HIGH (ref 98–111)
Creatinine, Ser: 0.62 mg/dL (ref 0.61–1.24)
GFR, Estimated: >60 mL/min (ref >60)
Glucose, Bld: 112 mg/dL — ABNORMAL HIGH (ref 70–99)
Potassium: 3.6 mmol/L (ref 3.5–5.1)
Sodium: 148 mmol/L — ABNORMAL HIGH (ref 135–145)

## 2021-01-06 LAB — URINALYSIS, ROUTINE W REFLEX MICROSCOPIC
Bilirubin Urine: NEGATIVE
Glucose, UA: NEGATIVE mg/dL
Hgb urine dipstick: NEGATIVE
Ketones, ur: NEGATIVE mg/dL
Leukocytes,Ua: NEGATIVE
Nitrite: NEGATIVE
Protein, ur: 30 mg/dL — AB
Specific Gravity, Urine: 1.013 (ref 1.005–1.030)
pH: 7 (ref 5.0–8.0)

## 2021-01-06 LAB — CBC
HCT: 30.2 % — ABNORMAL LOW (ref 39.0–52.0)
Hemoglobin: 9.7 g/dL — ABNORMAL LOW (ref 13.0–17.0)
MCH: 28.8 pg (ref 26.0–34.0)
MCHC: 32.1 g/dL (ref 30.0–36.0)
MCV: 89.6 fL (ref 80.0–100.0)
Platelets: 196 10*3/uL (ref 150–400)
RBC: 3.37 MIL/uL — ABNORMAL LOW (ref 4.22–5.81)
RDW: 16.7 % — ABNORMAL HIGH (ref 11.5–15.5)
WBC: 7.1 10*3/uL (ref 4.0–10.5)
nRBC: 0 % (ref 0.0–0.2)

## 2021-01-06 LAB — CULTURE, RESPIRATORY W GRAM STAIN

## 2021-01-06 LAB — CALCIUM, IONIZED: Calcium, Ionized, Serum: 6.4 mg/dL — ABNORMAL HIGH (ref 4.5–5.6)

## 2021-01-06 LAB — VANCOMYCIN, TROUGH: Vancomycin Tr: 10 ug/mL — ABNORMAL LOW (ref 15–20)

## 2021-01-06 IMAGING — DX DG CHEST 1V PORT
1 series · 1 of 1 positions shown · non-contrast
Comparison: Chest x-ray dated [DATE].

CLINICAL DATA: Tracheostomy.  Pneumonia.

EXAM:
PORTABLE CHEST 1 VIEW

[chest ap]
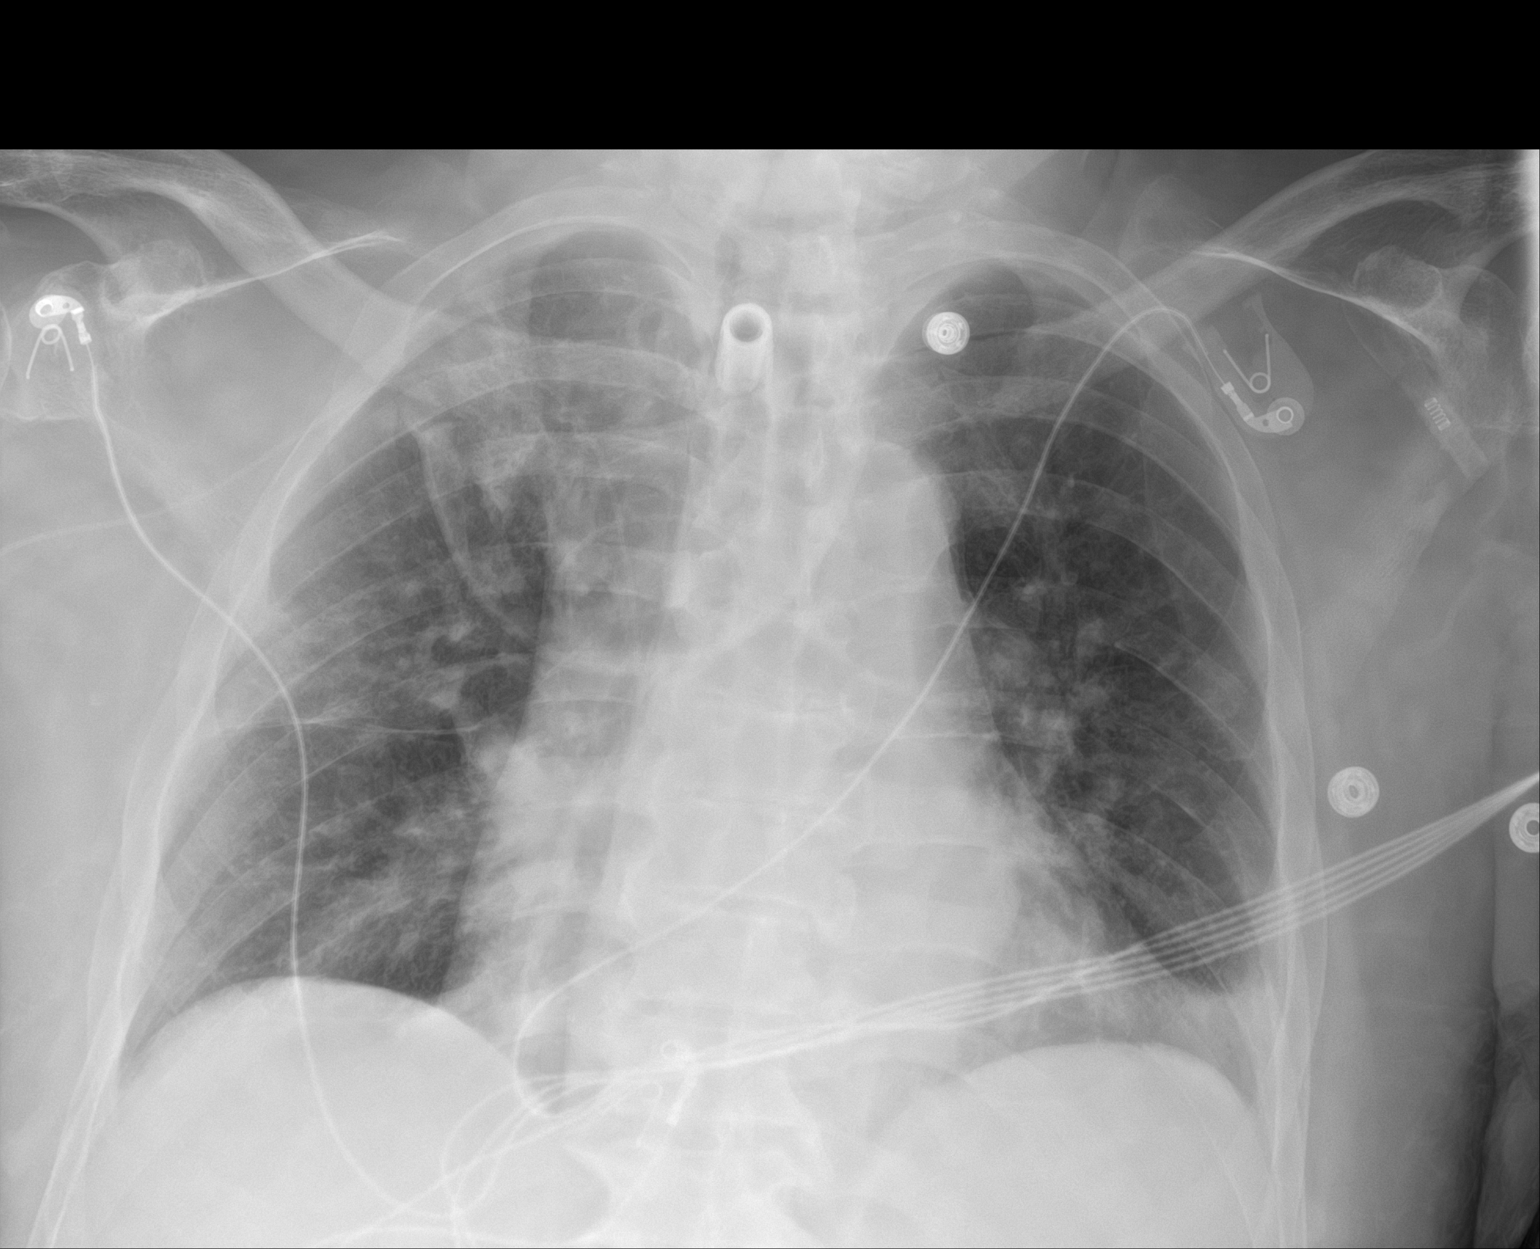

[1 of 1 positions shown; findings below may reference images not displayed]

FINDINGS: Stable cardiomegaly. Tracheostomy tube appears appropriately
positioned in the midline. Patchy subtle airspace opacities
bilaterally, not significantly changed compared to most recent chest
x-ray of [DATE] but significantly improved compared to previous
chest x-rays dated [DATE] indicating resolved/resolving
multifocal pneumonia.
IMPRESSION: Resolved/resolving multifocal pneumonia, with significantly improved
aeration throughout both lungs compared to the earlier chest x-ray
of [DATE].

## 2021-01-07 LAB — COMPREHENSIVE METABOLIC PANEL
ALT: 25 U/L (ref 0–44)
AST: 21 U/L (ref 15–41)
Albumin: 2.6 g/dL — ABNORMAL LOW (ref 3.5–5.0)
Alkaline Phosphatase: 147 U/L — ABNORMAL HIGH (ref 38–126)
Anion gap: 6 (ref 5–15)
BUN: 34 mg/dL — ABNORMAL HIGH (ref 8–23)
CO2: 31 mmol/L (ref 22–32)
Calcium: 11.1 mg/dL — ABNORMAL HIGH (ref 8.9–10.3)
Chloride: 113 mmol/L — ABNORMAL HIGH (ref 98–111)
Creatinine, Ser: 0.69 mg/dL (ref 0.61–1.24)
GFR, Estimated: >60 mL/min (ref >60)
Glucose, Bld: 128 mg/dL — ABNORMAL HIGH (ref 70–99)
Potassium: 3.5 mmol/L (ref 3.5–5.1)
Sodium: 150 mmol/L — ABNORMAL HIGH (ref 135–145)
Total Bilirubin: 0.7 mg/dL (ref 0.3–1.2)
Total Protein: 7.7 g/dL (ref 6.5–8.1)

## 2021-01-07 LAB — CBC
HCT: 31.2 % — ABNORMAL LOW (ref 39.0–52.0)
Hemoglobin: 9.9 g/dL — ABNORMAL LOW (ref 13.0–17.0)
MCH: 29.2 pg (ref 26.0–34.0)
MCHC: 31.7 g/dL (ref 30.0–36.0)
MCV: 92 fL (ref 80.0–100.0)
Platelets: 207 10*3/uL (ref 150–400)
RBC: 3.39 MIL/uL — ABNORMAL LOW (ref 4.22–5.81)
RDW: 16.9 % — ABNORMAL HIGH (ref 11.5–15.5)
WBC: 7.4 10*3/uL (ref 4.0–10.5)
nRBC: 0 % (ref 0.0–0.2)

## 2021-01-07 LAB — TYPE AND SCREEN
ABO/RH(D): O POS
Antibody Screen: NEGATIVE
Unit division: 0

## 2021-01-07 LAB — SEDIMENTATION RATE: Sed Rate: 87 mm/hr — ABNORMAL HIGH (ref 0–16)

## 2021-01-07 LAB — URINE CULTURE: Culture: NO GROWTH

## 2021-01-07 LAB — BPAM RBC
Blood Product Expiration Date: 202205102359
ISSUE DATE / TIME: 202204151105
Unit Type and Rh: 5100

## 2021-01-07 LAB — C-REACTIVE PROTEIN: CRP: 2.5 mg/dL — ABNORMAL HIGH (ref <1.0)

## 2021-01-07 LAB — MAGNESIUM: Magnesium: 2.3 mg/dL (ref 1.7–2.4)

## 2021-01-07 LAB — PHOSPHORUS: Phosphorus: 2.7 mg/dL (ref 2.5–4.6)

## 2021-01-07 LAB — TSH: TSH: 1.72 u[IU]/mL (ref 0.350–4.500)

## 2021-01-08 ENCOUNTER — Other Ambulatory Visit (HOSPITAL_COMMUNITY): Payer: BC Managed Care – PPO

## 2021-01-08 LAB — CBC
HCT: 36.7 % — ABNORMAL LOW (ref 39.0–52.0)
Hemoglobin: 11.3 g/dL — ABNORMAL LOW (ref 13.0–17.0)
MCH: 28.2 pg (ref 26.0–34.0)
MCHC: 30.8 g/dL (ref 30.0–36.0)
MCV: 91.5 fL (ref 80.0–100.0)
Platelets: 222 10*3/uL (ref 150–400)
RBC: 4.01 MIL/uL — ABNORMAL LOW (ref 4.22–5.81)
RDW: 17.2 % — ABNORMAL HIGH (ref 11.5–15.5)
WBC: 8.7 10*3/uL (ref 4.0–10.5)
nRBC: 0 % (ref 0.0–0.2)

## 2021-01-08 LAB — VANCOMYCIN, TROUGH: Vancomycin Tr: 19 ug/mL (ref 15–20)

## 2021-01-08 LAB — HEMOGLOBIN A1C
Hgb A1c MFr Bld: 6 % — ABNORMAL HIGH (ref 4.8–5.6)
Mean Plasma Glucose: 126 mg/dL

## 2021-01-08 LAB — BASIC METABOLIC PANEL
Anion gap: 6 (ref 5–15)
BUN: 36 mg/dL — ABNORMAL HIGH (ref 8–23)
CO2: 29 mmol/L (ref 22–32)
Calcium: 11 mg/dL — ABNORMAL HIGH (ref 8.9–10.3)
Chloride: 115 mmol/L — ABNORMAL HIGH (ref 98–111)
Creatinine, Ser: 0.87 mg/dL (ref 0.61–1.24)
GFR, Estimated: >60 mL/min (ref >60)
Glucose, Bld: 116 mg/dL — ABNORMAL HIGH (ref 70–99)
Potassium: 3.3 mmol/L — ABNORMAL LOW (ref 3.5–5.1)
Sodium: 150 mmol/L — ABNORMAL HIGH (ref 135–145)

## 2021-01-08 LAB — CULTURE, RESPIRATORY W GRAM STAIN: Gram Stain: NONE SEEN

## 2021-01-08 LAB — MAGNESIUM: Magnesium: 2.3 mg/dL (ref 1.7–2.4)

## 2021-01-08 LAB — PHOSPHORUS: Phosphorus: 2.9 mg/dL (ref 2.5–4.6)

## 2021-01-08 IMAGING — MR MR LUMBAR SPINE W/O CM
4 of 5 series · 27 of 48 positions shown · non-contrast
Comparison: None available.

CLINICAL DATA: Initial evaluation for possible osteomyelitis.

EXAM:
MRI LUMBAR SPINE WITHOUT CONTRAST
MRI SACRUM WITHOUT CONTRAST
TECHNIQUE: Multiplanar, multisequence MR imaging of the lumbar spine and sacrum
was performed. No intravenous contrast was administered.

[Series 5: T2 · sagittal · 4.0mm · 0.73mm/px · 8 of 16 slices shown (1 of 2)]
[im 1/16]
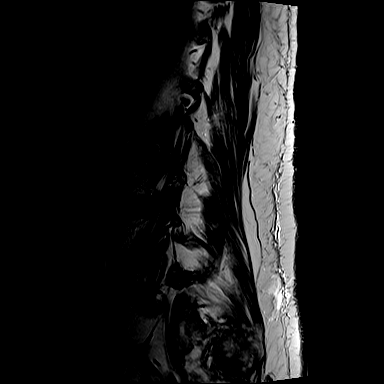
[im 3/16]
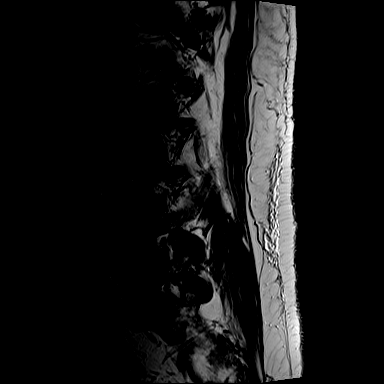
[im 5/16]
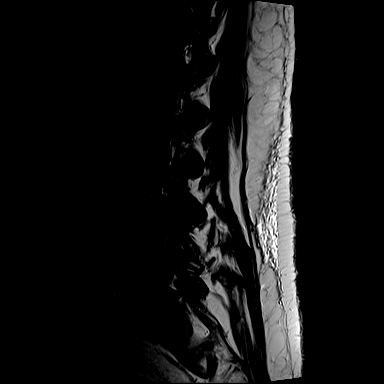
[im 7/16]
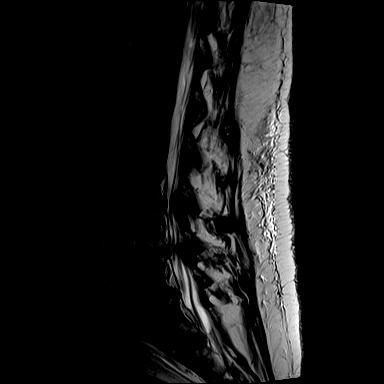
[im 9/16]
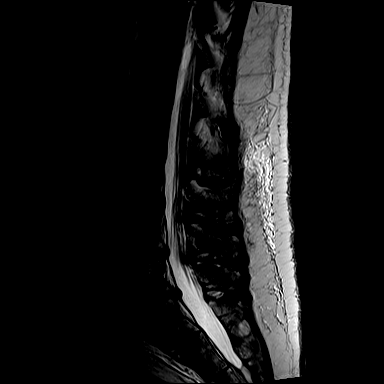
[im 11/16]
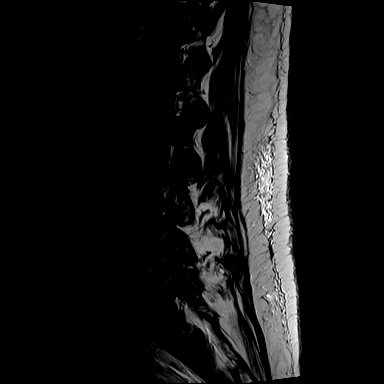
[im 13/16]
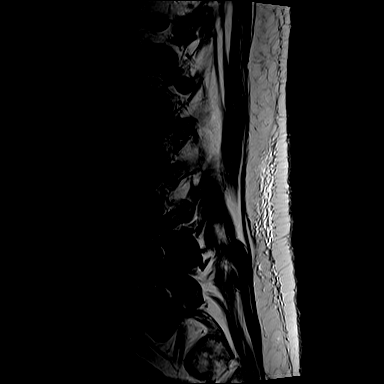
[im 16/16]
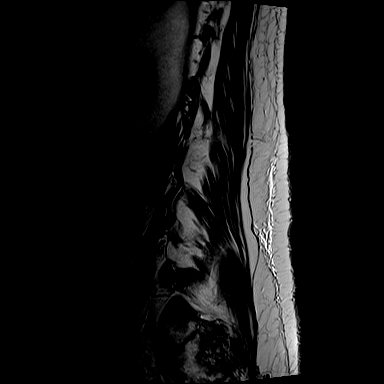

[Series 7: T1 · sagittal · 4.0mm · 0.88mm/px · 7 of 16 slices shown (1 of 2)]
[im 1/16]
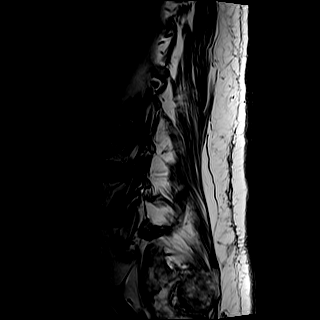
[im 3/16]
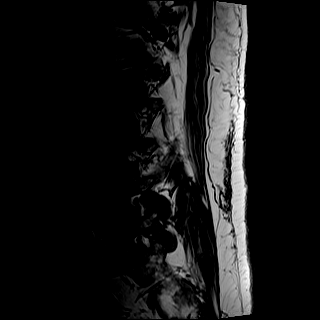
[im 6/16]
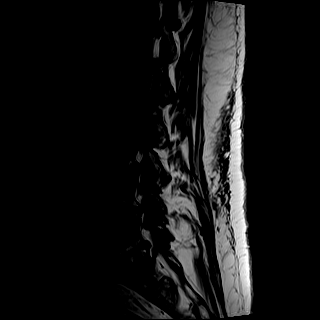
[im 8/16]
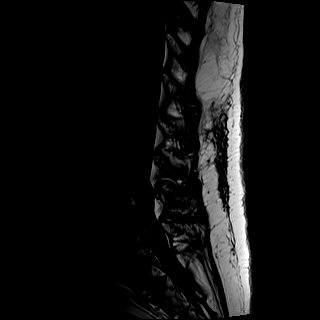
[im 11/16]
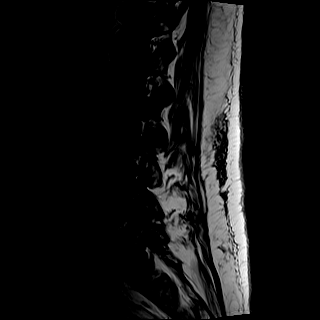
[im 13/16]
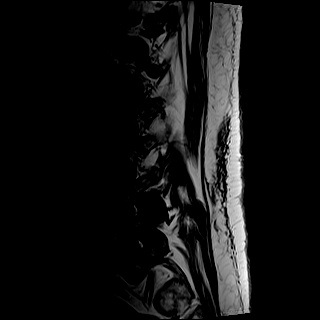
[im 16/16]
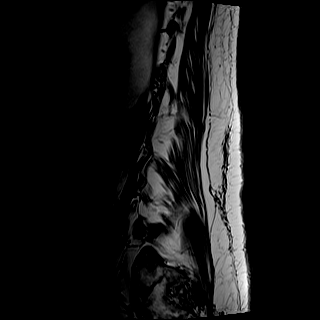

[Series 8: T2 · axial · 5.0mm · 0.57mm/px · z∈[-85,+149]mm · 9 of 30 slices shown (2 of 2)]
[im 1/30]
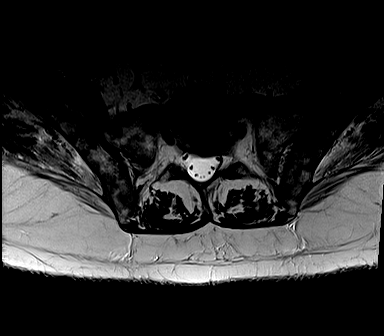
[im 5/30]
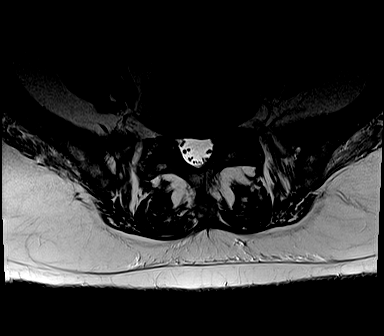
[im 10/30]
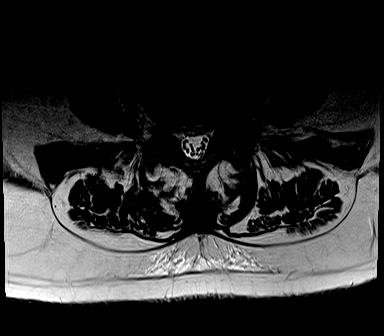
[im 13/30]
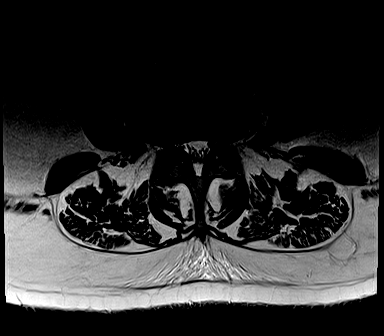
[im 15/30]
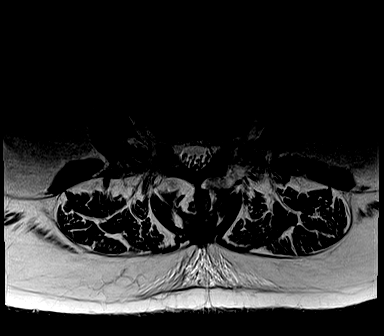
[im 17/30]
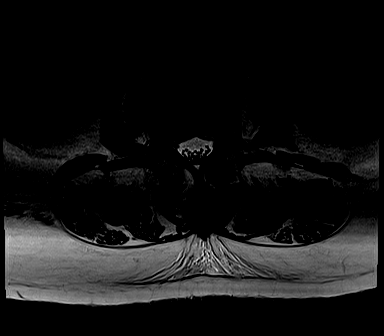
[im 20/30]
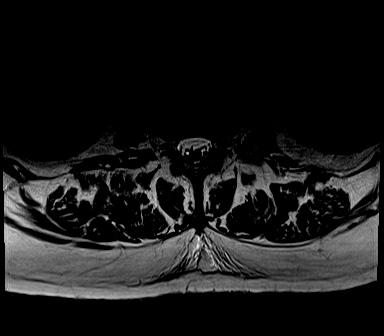
[im 25/30]
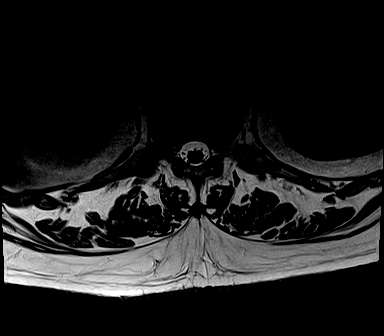
[im 30/30]
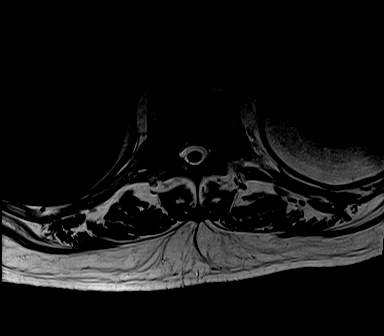

[Series 9: T1 · axial · 5.0mm · 0.34mm/px · z∈[-56,+112]mm · 3 of 30 slices shown (2 of 2)]
[im 5/30]
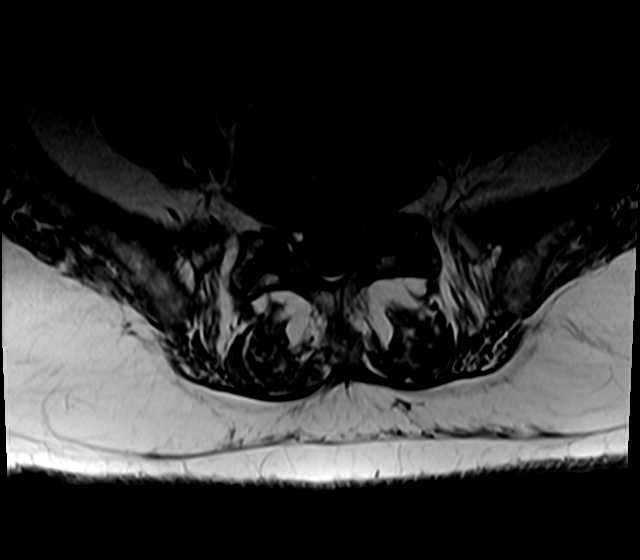
[im 15/30]
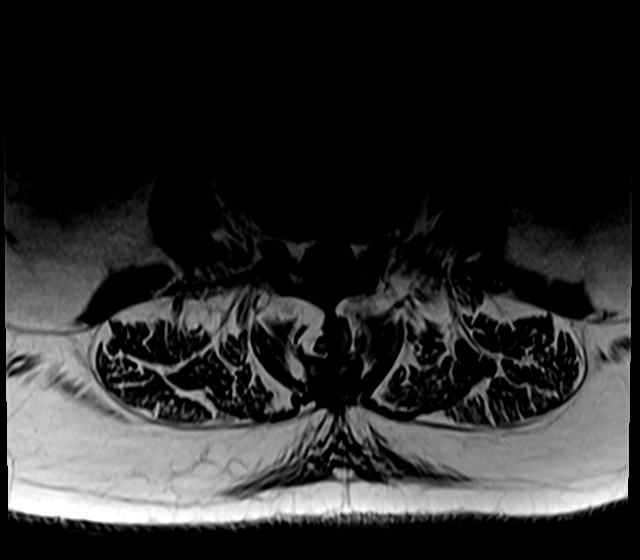
[im 25/30]
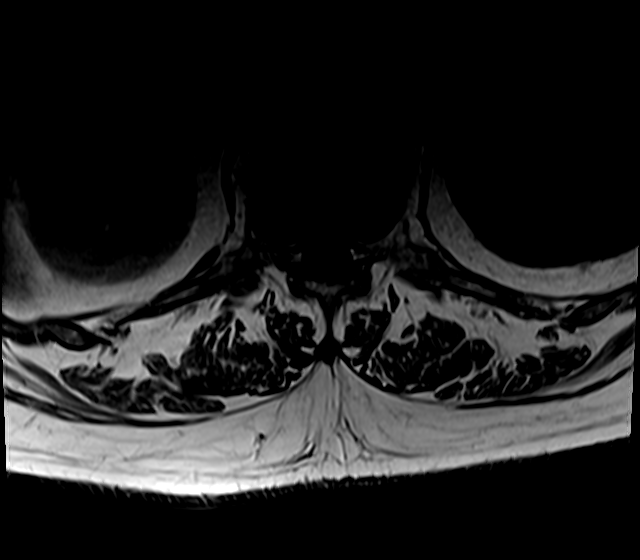

[27 of 48 positions shown; findings below may reference images not displayed]

FINDINGS: MRI LUMBAR SPINE FINDINGS

Segmentation: Standard. Lowest well-formed disc space labeled the
L5-S1 level.

Alignment: Physiologic with preservation of the normal lumbar
lordosis. No listhesis.

Vertebrae: Vertebral body height maintained without acute or chronic
fracture. Bone marrow signal intensity somewhat heterogeneous but
overall within normal limits. No worrisome osseous lesions.
Discogenic reactive endplate change present about the L3-4 through
L5-S1 interspaces. No other abnormal marrow edema. No findings to
suggest osteomyelitis discitis or septic arthritis within the lumbar
spine.

Conus medullaris and cauda equina: Conus extends to the L2-3 level.
Conus and cauda equina appear normal. No epidural collections.

Paraspinal and other soft tissues: Paraspinous soft tissues
demonstrate no acute finding. Partially visualized visceral
structures grossly unremarkable.

Disc levels:

T11-12: Seen only on sagittal projection. Disc desiccation with
minimal disc bulge. Moderate bilateral facet hypertrophy. No
significant spinal stenosis. Moderate bilateral foraminal narrowing.

T12-L1: Disc desiccation with reactive endplate spurring. Bilateral
facet hypertrophy. No stenosis.

L1-2: Disc desiccation without disc bulge. No spinal stenosis.
Foramina remain patent.

L2-3: Prominent reactive endplate spurring without significant disc
bulge. Mild facet hypertrophy. No significant canal or foraminal
stenosis.

L3-4: Diffuse disc bulge with disc desiccation and intervertebral
disc space narrowing. Associated reactive endplate change with
marginal endplate osteophytic spurring. Mild facet and ligament
flavum hypertrophy. Resultant mild narrowing of the lateral recesses
bilaterally. Central canal remains patent. Moderate right with mild
left L3 foraminal stenosis.

L4-5: Degenerative intervertebral disc space narrowing with diffuse
disc bulge and disc desiccation. Associated reactive endplate change
with marginal endplate osteophytic spurring. Moderate bilateral
facet hypertrophy. Resultant mild narrowing of the lateral recesses
bilaterally. Central canal remains patent. Moderate bilateral L4
foraminal stenosis.

L5-S1: Advanced degenerative intervertebral disc space narrowing
with diffuse disc bulge and disc desiccation. Disc bulge slightly
asymmetric to the left. Associated reactive endplate change with
marginal endplate osteophytic spurring. Mild bilateral facet
hypertrophy. Resultant mild narrowing of the left lateral recess.
Central canal remains patent. Mild right with moderate left L5
foraminal stenosis.

MRI SACRUM FINDINGS

Large decubitus ulcer seen just to the left of midline, overlying
the distal sacrum/coccyx. Associated soft tissue defect extends
through the underlying subcutaneous fat to involve the left greater
than right paramidline gluteal musculature. Associated edema within
the adjacent musculature consistent with associated myositis (series
4, image 34). The deep aspect of the ulcerative wound extends to the
underlying left aspect of the distal sacrum/coccyx which appears to
be involved. Subtle marrow edema within the sacrum/coccyx at this
level suspicious for mild and/or early changes of osteomyelitis
(series 5, image 13, also seen on series 7, images 30, 31).
Associated edema and/or small volume free fluid noted within the
adjacent presacral space. No loculated or discrete soft tissue
collections or abscess.

Remainder of the sacrum demonstrates a normal MRI appearance.
Underlying bone marrow signal intensity heterogeneous. No discrete
or worrisome osseous lesions. SI joints approximated and symmetric
without evidence for acute or chronic sacroiliitis.

Additional symmetric increased STIR signal intensity noted within
the partially visualized gluteal musculature superiorly, likely
reflecting denervation changes (series 7, image 15). Susceptibility
artifact overlying the central pelvis suspected to be related to a
Foley catheter. Remainder of the visualized soft tissues demonstrate
no other acute or significant finding.
IMPRESSION: MRI LUMBAR SPINE IMPRESSION:

1. No MRI evidence for osteomyelitis discitis or septic arthritis
within the lumbar spine.
2. Degenerative disc bulging with facet hypertrophy at L3-4 through
L5-S1 with resultant mild lateral recess narrowing bilaterally. No
significant spinal stenosis.
3. Multifactorial degenerative changes with resultant multilevel
foraminal narrowing as above. Notable findings include moderate
right L3 and bilateral L4 foraminal stenosis, with moderate left L5
foraminal narrowing.

MRI SACRUM IMPRESSION:

Decubitus ulcer overlying the left aspect of the distal
sacrum/coccyx with associated regional cellulitis and myositis. Deep
aspect of the ulceration extends to involve the distal sacrum/coccyx
where subtle marrow edema is suspicious for associated early and/or
mild osteomyelitis. No loculated or drainable fluid collections
identified.

## 2021-01-08 IMAGING — MR MR SACRUM / SI JOINTS WO CM
4 of 7 series · 17 of 48 positions shown · non-contrast
Comparison: None available.

CLINICAL DATA: Initial evaluation for possible osteomyelitis.

EXAM:
MRI LUMBAR SPINE WITHOUT CONTRAST
MRI SACRUM WITHOUT CONTRAST
TECHNIQUE: Multiplanar, multisequence MR imaging of the lumbar spine and sacrum
was performed. No intravenous contrast was administered.

[Series 2: T1 · oblique · 4.0mm · 0.43mm/px · 7 of 36 slices shown (1 of 3)]
[im 1/36]
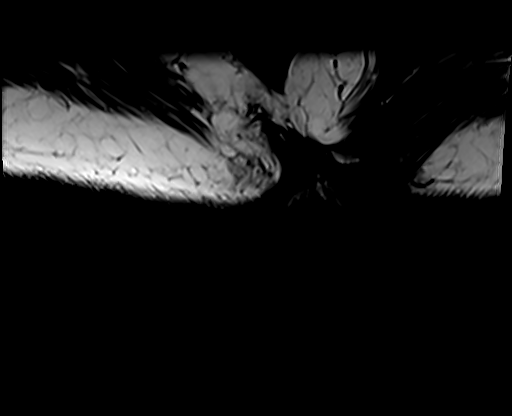
[im 6/36]
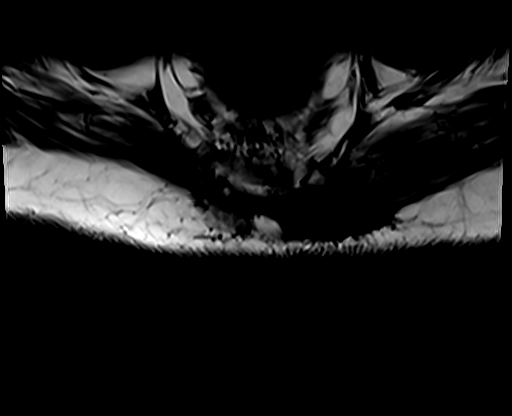
[im 12/36]
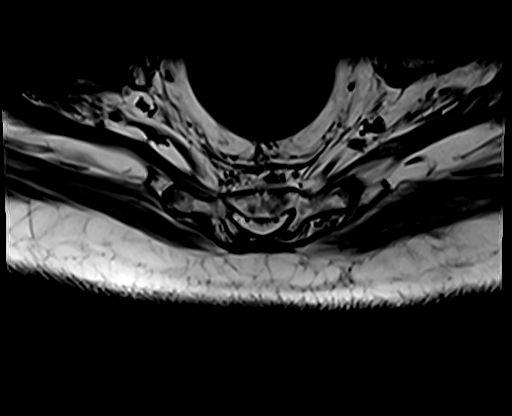
[im 18/36]
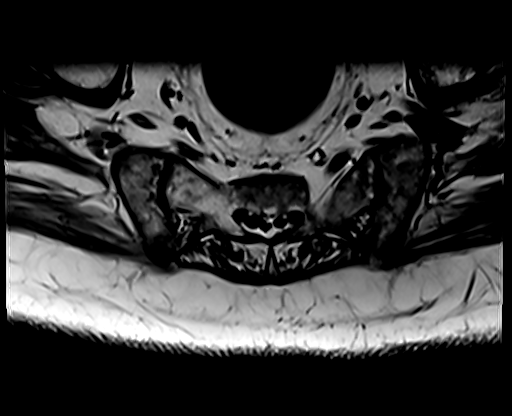
[im 24/36]
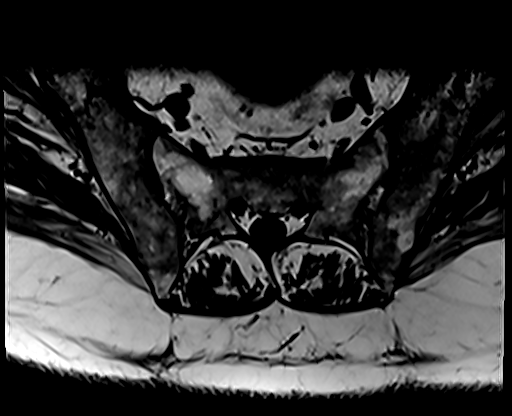
[im 30/36]
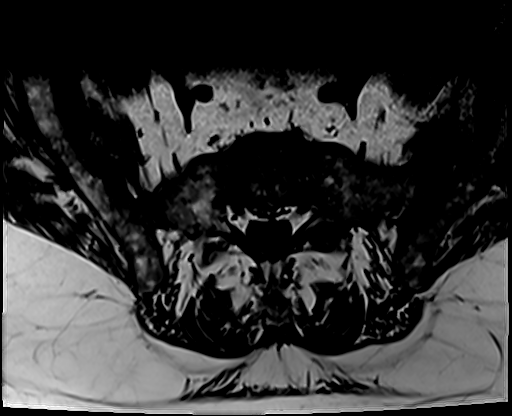
[im 36/36]
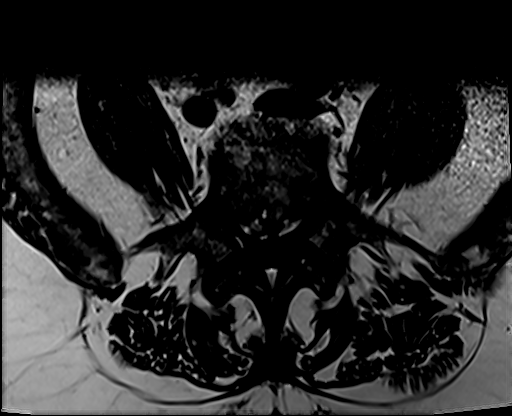

[Series 5: STIR · oblique · 3.0mm · 0.43mm/px · 3 of 26 slices shown]
[im 6/26]
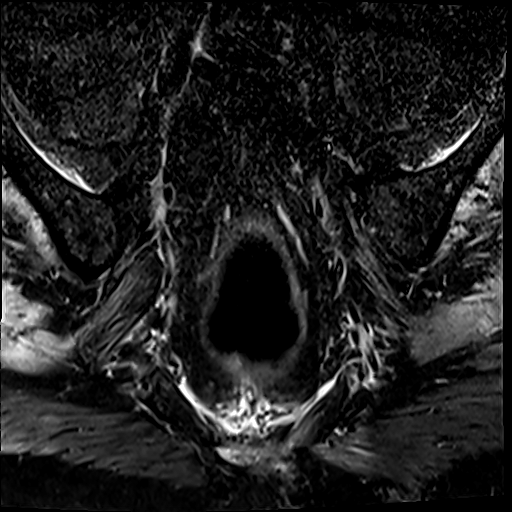
[im 16/26]
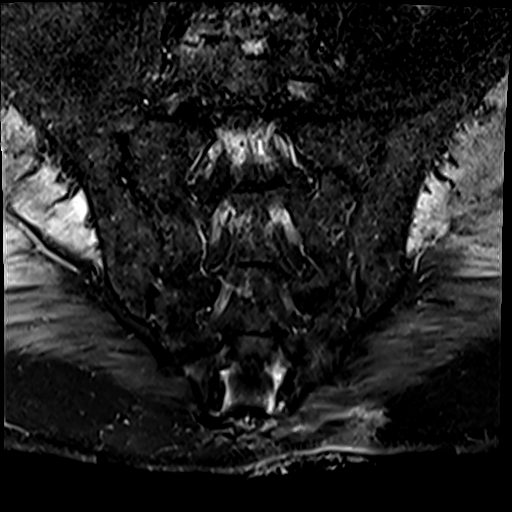
[im 26/26]
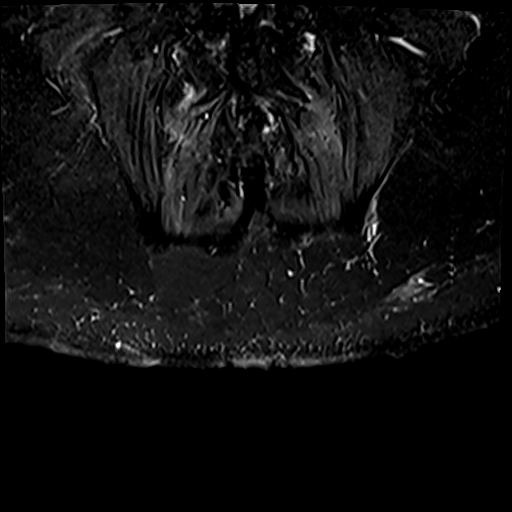

[Series 6: T1 · oblique · 3.0mm · 0.43mm/px · 4 of 26 slices shown (2 of 3)]
[im 1/26]
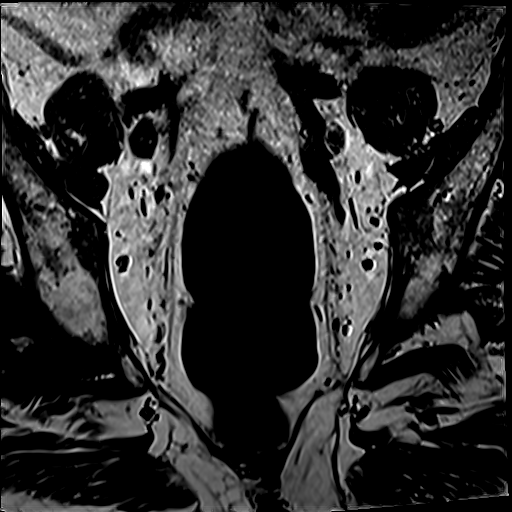
[im 6/26]
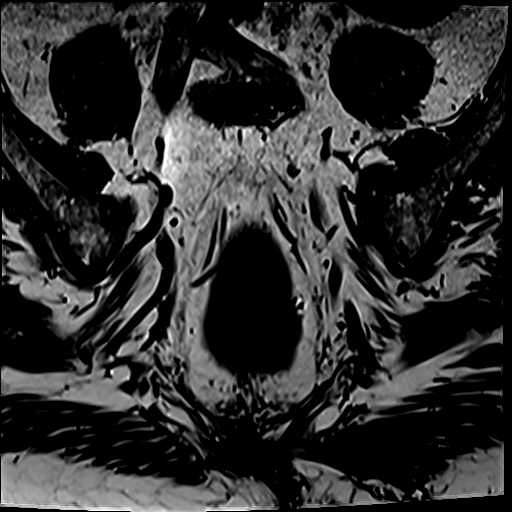
[im 16/26]
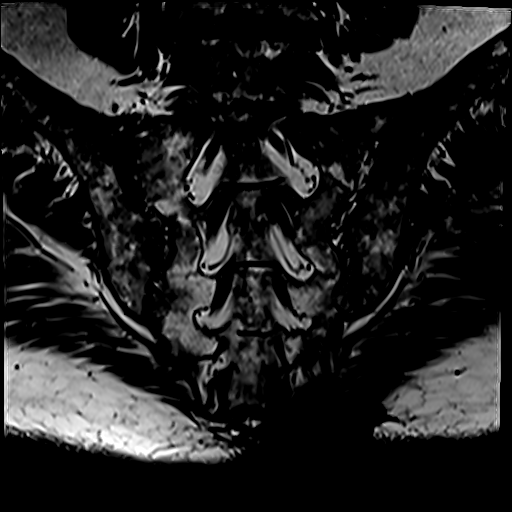
[im 26/26]
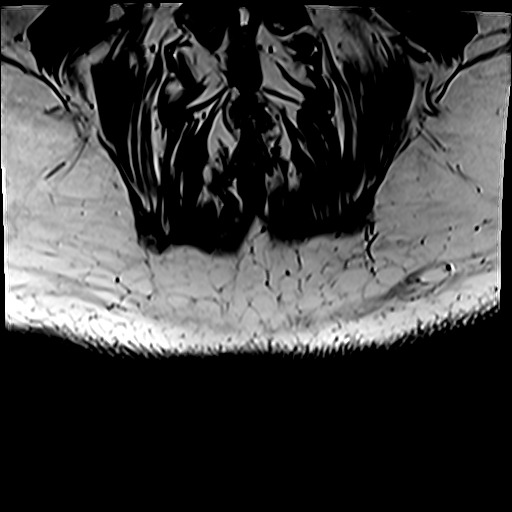

[Series 8: T1 · sagittal · 4.0mm · 0.39mm/px · 3 of 32 slices shown (3 of 3)]
[im 6/32]
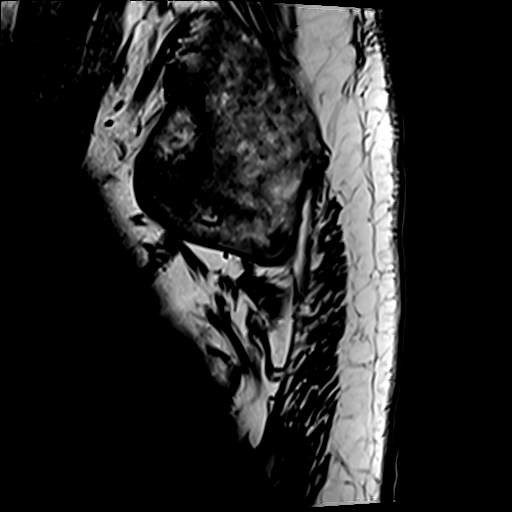
[im 16/32]
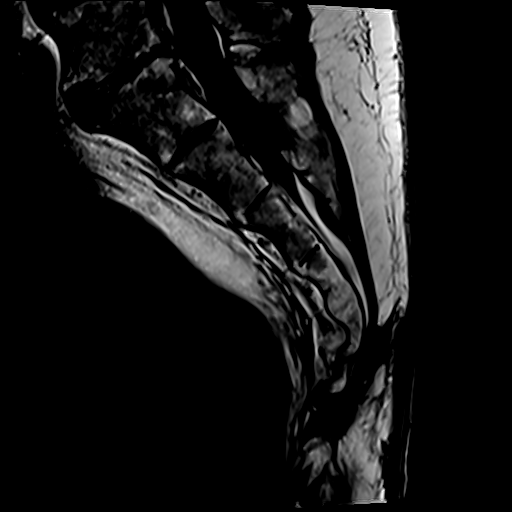
[im 26/32]
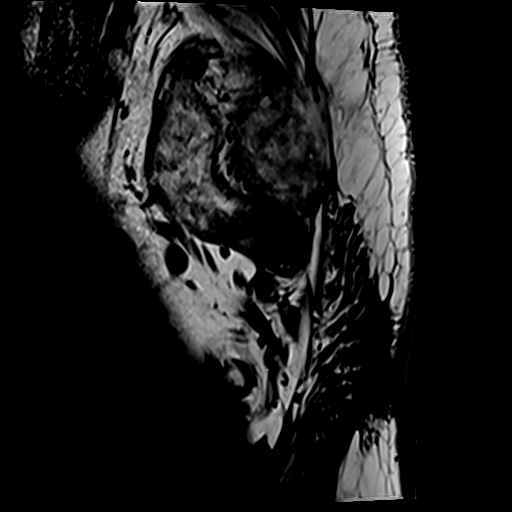

[17 of 48 positions shown; findings below may reference images not displayed]

FINDINGS: MRI LUMBAR SPINE FINDINGS

Segmentation: Standard. Lowest well-formed disc space labeled the
L5-S1 level.

Alignment: Physiologic with preservation of the normal lumbar
lordosis. No listhesis.

Vertebrae: Vertebral body height maintained without acute or chronic
fracture. Bone marrow signal intensity somewhat heterogeneous but
overall within normal limits. No worrisome osseous lesions.
Discogenic reactive endplate change present about the L3-4 through
L5-S1 interspaces. No other abnormal marrow edema. No findings to
suggest osteomyelitis discitis or septic arthritis within the lumbar
spine.

Conus medullaris and cauda equina: Conus extends to the L2-3 level.
Conus and cauda equina appear normal. No epidural collections.

Paraspinal and other soft tissues: Paraspinous soft tissues
demonstrate no acute finding. Partially visualized visceral
structures grossly unremarkable.

Disc levels:

T11-12: Seen only on sagittal projection. Disc desiccation with
minimal disc bulge. Moderate bilateral facet hypertrophy. No
significant spinal stenosis. Moderate bilateral foraminal narrowing.

T12-L1: Disc desiccation with reactive endplate spurring. Bilateral
facet hypertrophy. No stenosis.

L1-2: Disc desiccation without disc bulge. No spinal stenosis.
Foramina remain patent.

L2-3: Prominent reactive endplate spurring without significant disc
bulge. Mild facet hypertrophy. No significant canal or foraminal
stenosis.

L3-4: Diffuse disc bulge with disc desiccation and intervertebral
disc space narrowing. Associated reactive endplate change with
marginal endplate osteophytic spurring. Mild facet and ligament
flavum hypertrophy. Resultant mild narrowing of the lateral recesses
bilaterally. Central canal remains patent. Moderate right with mild
left L3 foraminal stenosis.

L4-5: Degenerative intervertebral disc space narrowing with diffuse
disc bulge and disc desiccation. Associated reactive endplate change
with marginal endplate osteophytic spurring. Moderate bilateral
facet hypertrophy. Resultant mild narrowing of the lateral recesses
bilaterally. Central canal remains patent. Moderate bilateral L4
foraminal stenosis.

L5-S1: Advanced degenerative intervertebral disc space narrowing
with diffuse disc bulge and disc desiccation. Disc bulge slightly
asymmetric to the left. Associated reactive endplate change with
marginal endplate osteophytic spurring. Mild bilateral facet
hypertrophy. Resultant mild narrowing of the left lateral recess.
Central canal remains patent. Mild right with moderate left L5
foraminal stenosis.

MRI SACRUM FINDINGS

Large decubitus ulcer seen just to the left of midline, overlying
the distal sacrum/coccyx. Associated soft tissue defect extends
through the underlying subcutaneous fat to involve the left greater
than right paramidline gluteal musculature. Associated edema within
the adjacent musculature consistent with associated myositis (series
4, image 34). The deep aspect of the ulcerative wound extends to the
underlying left aspect of the distal sacrum/coccyx which appears to
be involved. Subtle marrow edema within the sacrum/coccyx at this
level suspicious for mild and/or early changes of osteomyelitis
(series 5, image 13, also seen on series 7, images 30, 31).
Associated edema and/or small volume free fluid noted within the
adjacent presacral space. No loculated or discrete soft tissue
collections or abscess.

Remainder of the sacrum demonstrates a normal MRI appearance.
Underlying bone marrow signal intensity heterogeneous. No discrete
or worrisome osseous lesions. SI joints approximated and symmetric
without evidence for acute or chronic sacroiliitis.

Additional symmetric increased STIR signal intensity noted within
the partially visualized gluteal musculature superiorly, likely
reflecting denervation changes (series 7, image 15). Susceptibility
artifact overlying the central pelvis suspected to be related to a
Foley catheter. Remainder of the visualized soft tissues demonstrate
no other acute or significant finding.
IMPRESSION: MRI LUMBAR SPINE IMPRESSION:

1. No MRI evidence for osteomyelitis discitis or septic arthritis
within the lumbar spine.
2. Degenerative disc bulging with facet hypertrophy at L3-4 through
L5-S1 with resultant mild lateral recess narrowing bilaterally. No
significant spinal stenosis.
3. Multifactorial degenerative changes with resultant multilevel
foraminal narrowing as above. Notable findings include moderate
right L3 and bilateral L4 foraminal stenosis, with moderate left L5
foraminal narrowing.

MRI SACRUM IMPRESSION:

Decubitus ulcer overlying the left aspect of the distal
sacrum/coccyx with associated regional cellulitis and myositis. Deep
aspect of the ulceration extends to involve the distal sacrum/coccyx
where subtle marrow edema is suspicious for associated early and/or
mild osteomyelitis. No loculated or drainable fluid collections
identified.

## 2021-01-09 ENCOUNTER — Telehealth: Payer: Self-pay

## 2021-01-09 LAB — RENAL FUNCTION PANEL
Albumin: 2.5 g/dL — ABNORMAL LOW (ref 3.5–5.0)
Anion gap: 6 (ref 5–15)
BUN: 49 mg/dL — ABNORMAL HIGH (ref 8–23)
CO2: 27 mmol/L (ref 22–32)
Calcium: 11.2 mg/dL — ABNORMAL HIGH (ref 8.9–10.3)
Chloride: 116 mmol/L — ABNORMAL HIGH (ref 98–111)
Creatinine, Ser: 0.96 mg/dL (ref 0.61–1.24)
GFR, Estimated: >60 mL/min (ref >60)
Glucose, Bld: 137 mg/dL — ABNORMAL HIGH (ref 70–99)
Phosphorus: 3.8 mg/dL (ref 2.5–4.6)
Potassium: 3.8 mmol/L (ref 3.5–5.1)
Sodium: 149 mmol/L — ABNORMAL HIGH (ref 135–145)

## 2021-01-10 LAB — BASIC METABOLIC PANEL
Anion gap: 6 (ref 5–15)
BUN: 57 mg/dL — ABNORMAL HIGH (ref 8–23)
CO2: 28 mmol/L (ref 22–32)
Calcium: 10.9 mg/dL — ABNORMAL HIGH (ref 8.9–10.3)
Chloride: 115 mmol/L — ABNORMAL HIGH (ref 98–111)
Creatinine, Ser: 0.95 mg/dL (ref 0.61–1.24)
GFR, Estimated: >60 mL/min (ref >60)
Glucose, Bld: 140 mg/dL — ABNORMAL HIGH (ref 70–99)
Potassium: 3.6 mmol/L (ref 3.5–5.1)
Sodium: 149 mmol/L — ABNORMAL HIGH (ref 135–145)

## 2021-01-11 LAB — CBC
HCT: 31.2 % — ABNORMAL LOW (ref 39.0–52.0)
Hemoglobin: 9.5 g/dL — ABNORMAL LOW (ref 13.0–17.0)
MCH: 28.7 pg (ref 26.0–34.0)
MCHC: 30.4 g/dL (ref 30.0–36.0)
MCV: 94.3 fL (ref 80.0–100.0)
Platelets: 162 10*3/uL (ref 150–400)
RBC: 3.31 MIL/uL — ABNORMAL LOW (ref 4.22–5.81)
RDW: 17 % — ABNORMAL HIGH (ref 11.5–15.5)
WBC: 7.2 10*3/uL (ref 4.0–10.5)
nRBC: 0 % (ref 0.0–0.2)

## 2021-01-11 LAB — BASIC METABOLIC PANEL
Anion gap: 4 — ABNORMAL LOW (ref 5–15)
BUN: 54 mg/dL — ABNORMAL HIGH (ref 8–23)
CO2: 28 mmol/L (ref 22–32)
Calcium: 11 mg/dL — ABNORMAL HIGH (ref 8.9–10.3)
Chloride: 119 mmol/L — ABNORMAL HIGH (ref 98–111)
Creatinine, Ser: 0.97 mg/dL (ref 0.61–1.24)
GFR, Estimated: >60 mL/min (ref >60)
Glucose, Bld: 132 mg/dL — ABNORMAL HIGH (ref 70–99)
Potassium: 3.4 mmol/L — ABNORMAL LOW (ref 3.5–5.1)
Sodium: 151 mmol/L — ABNORMAL HIGH (ref 135–145)

## 2021-01-11 LAB — MAGNESIUM: Magnesium: 2.7 mg/dL — ABNORMAL HIGH (ref 1.7–2.4)

## 2021-01-12 LAB — POTASSIUM: Potassium: 3.7 mmol/L (ref 3.5–5.1)

## 2021-01-12 LAB — SODIUM: Sodium: 151 mmol/L — ABNORMAL HIGH (ref 135–145)

## 2021-01-13 ENCOUNTER — Other Ambulatory Visit (HOSPITAL_COMMUNITY): Payer: BC Managed Care – PPO

## 2021-01-13 LAB — BASIC METABOLIC PANEL
Anion gap: 6 (ref 5–15)
BUN: 41 mg/dL — ABNORMAL HIGH (ref 8–23)
CO2: 28 mmol/L (ref 22–32)
Calcium: 10.6 mg/dL — ABNORMAL HIGH (ref 8.9–10.3)
Chloride: 111 mmol/L (ref 98–111)
Creatinine, Ser: 0.9 mg/dL (ref 0.61–1.24)
GFR, Estimated: >60 mL/min (ref >60)
Glucose, Bld: 144 mg/dL — ABNORMAL HIGH (ref 70–99)
Potassium: 3.4 mmol/L — ABNORMAL LOW (ref 3.5–5.1)
Sodium: 145 mmol/L (ref 135–145)

## 2021-01-13 LAB — VANCOMYCIN, TROUGH: Vancomycin Tr: 24 ug/mL (ref 15–20)

## 2021-01-13 IMAGING — DX DG CHEST 1V PORT
1 series · 1 of 1 positions shown · non-contrast
Comparison: Chest x-ray [DATE]

CLINICAL DATA: Pneumonia.

EXAM:
PORTABLE CHEST 1 VIEW

[chest ap]
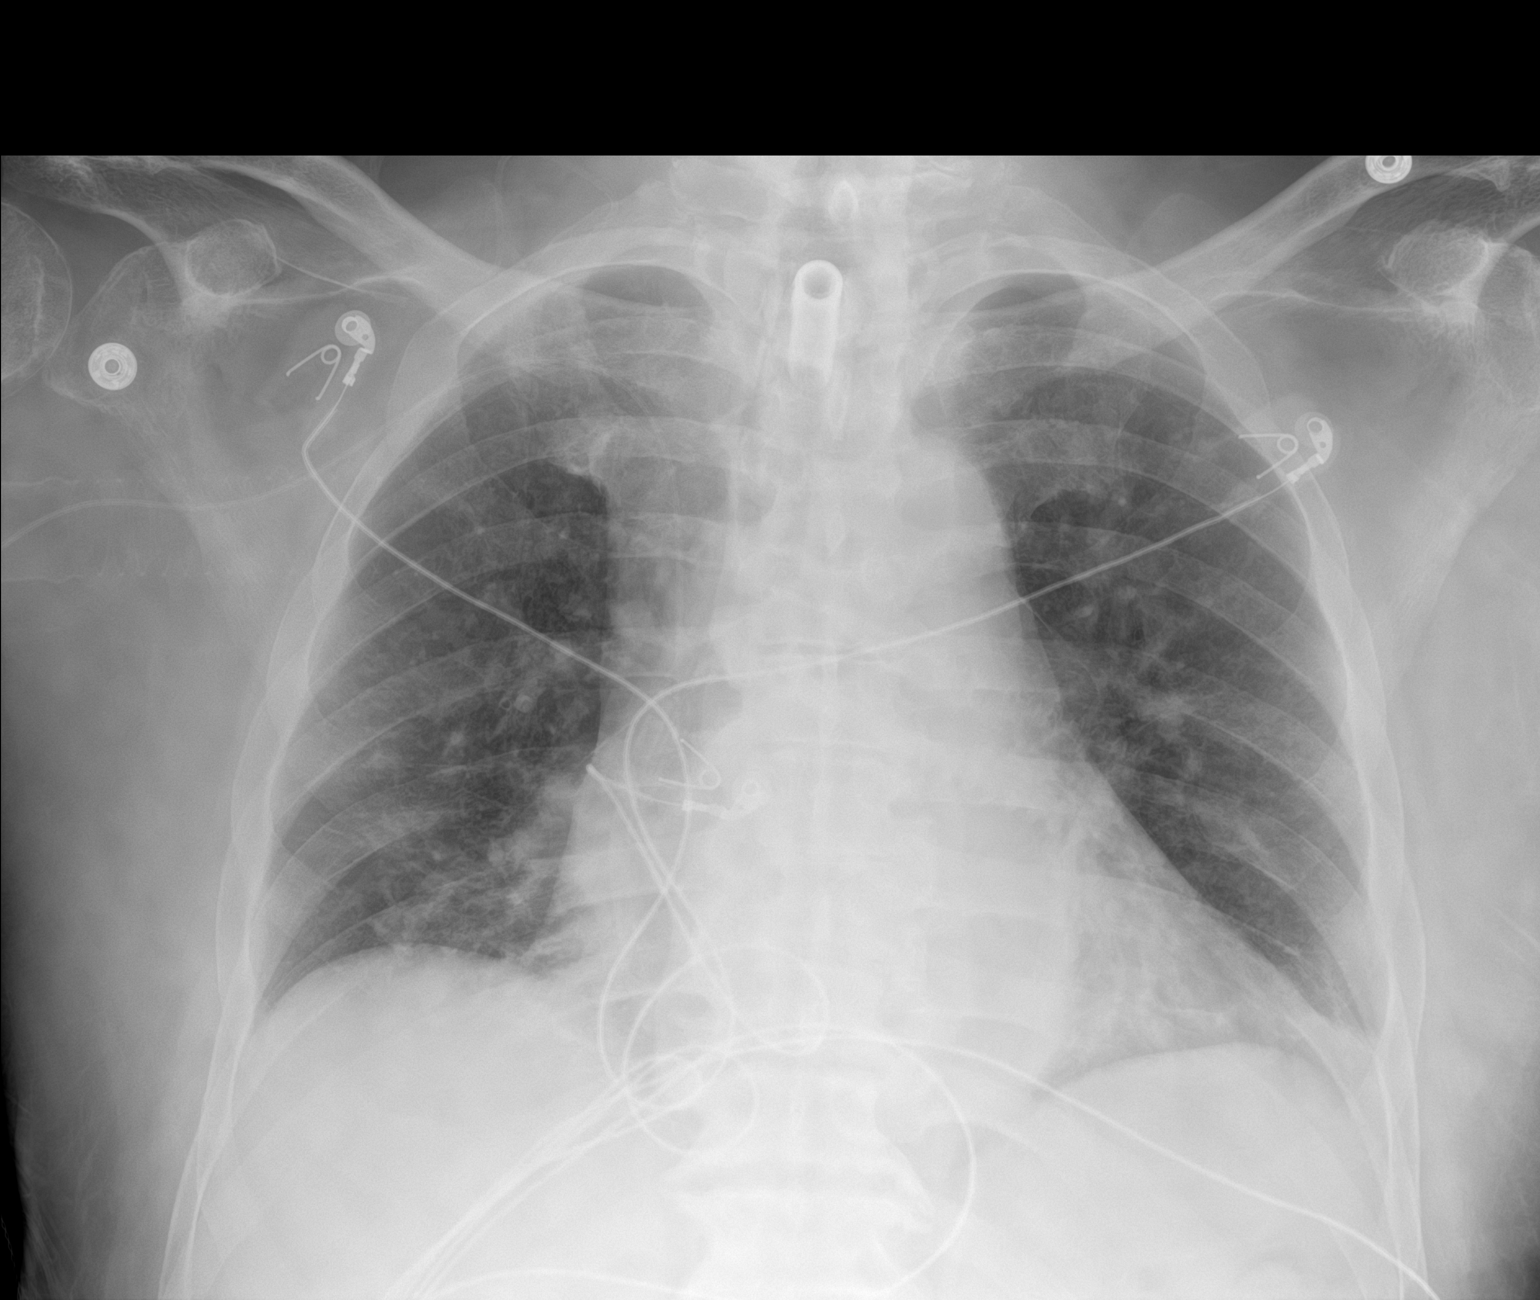

[1 of 1 positions shown; findings below may reference images not displayed]

FINDINGS: Right PICC with tip overlying the expected region of the distal
superior vena cava. Endotracheal tube with tip terminating 7 cm
above the carina.

The heart size and mediastinal contours are unchanged.

Persistent patchy airspace opacities . No pulmonary edema. No
pleural effusion. No pneumothorax.

No acute osseous abnormality.
IMPRESSION: Persistent patchy airspace opacities. Followup PA and lateral chest
X-ray is recommended in 3-4 weeks following therapy to ensure
resolution and exclude underlying malignancy.

## 2021-01-14 LAB — VANCOMYCIN, TROUGH: Vancomycin Tr: 17 ug/mL (ref 15–20)

## 2021-01-14 LAB — POTASSIUM: Potassium: 4.2 mmol/L (ref 3.5–5.1)

## 2021-01-15 ENCOUNTER — Other Ambulatory Visit (HOSPITAL_COMMUNITY): Payer: BC Managed Care – PPO

## 2021-01-15 LAB — CBC
HCT: 34.6 % — ABNORMAL LOW (ref 39.0–52.0)
Hemoglobin: 10.6 g/dL — ABNORMAL LOW (ref 13.0–17.0)
MCH: 28.3 pg (ref 26.0–34.0)
MCHC: 30.6 g/dL (ref 30.0–36.0)
MCV: 92.3 fL (ref 80.0–100.0)
Platelets: 167 10*3/uL (ref 150–400)
RBC: 3.75 MIL/uL — ABNORMAL LOW (ref 4.22–5.81)
RDW: 16.4 % — ABNORMAL HIGH (ref 11.5–15.5)
WBC: 7.1 10*3/uL (ref 4.0–10.5)
nRBC: 0 % (ref 0.0–0.2)

## 2021-01-15 LAB — BASIC METABOLIC PANEL
Anion gap: 5 (ref 5–15)
BUN: 29 mg/dL — ABNORMAL HIGH (ref 8–23)
CO2: 30 mmol/L (ref 22–32)
Calcium: 10.8 mg/dL — ABNORMAL HIGH (ref 8.9–10.3)
Chloride: 113 mmol/L — ABNORMAL HIGH (ref 98–111)
Creatinine, Ser: 0.98 mg/dL (ref 0.61–1.24)
GFR, Estimated: >60 mL/min (ref >60)
Glucose, Bld: 131 mg/dL — ABNORMAL HIGH (ref 70–99)
Potassium: 3.7 mmol/L (ref 3.5–5.1)
Sodium: 148 mmol/L — ABNORMAL HIGH (ref 135–145)

## 2021-01-15 LAB — MAGNESIUM: Magnesium: 2.4 mg/dL (ref 1.7–2.4)

## 2021-01-15 IMAGING — DX DG ABDOMEN 1V
1 series · 1 of 1 positions shown · non-contrast
Comparison: Single-view of the abdomen [DATE].

CLINICAL DATA: Question ileus.

EXAM:
ABDOMEN - 1 VIEW

[abdomen kub]
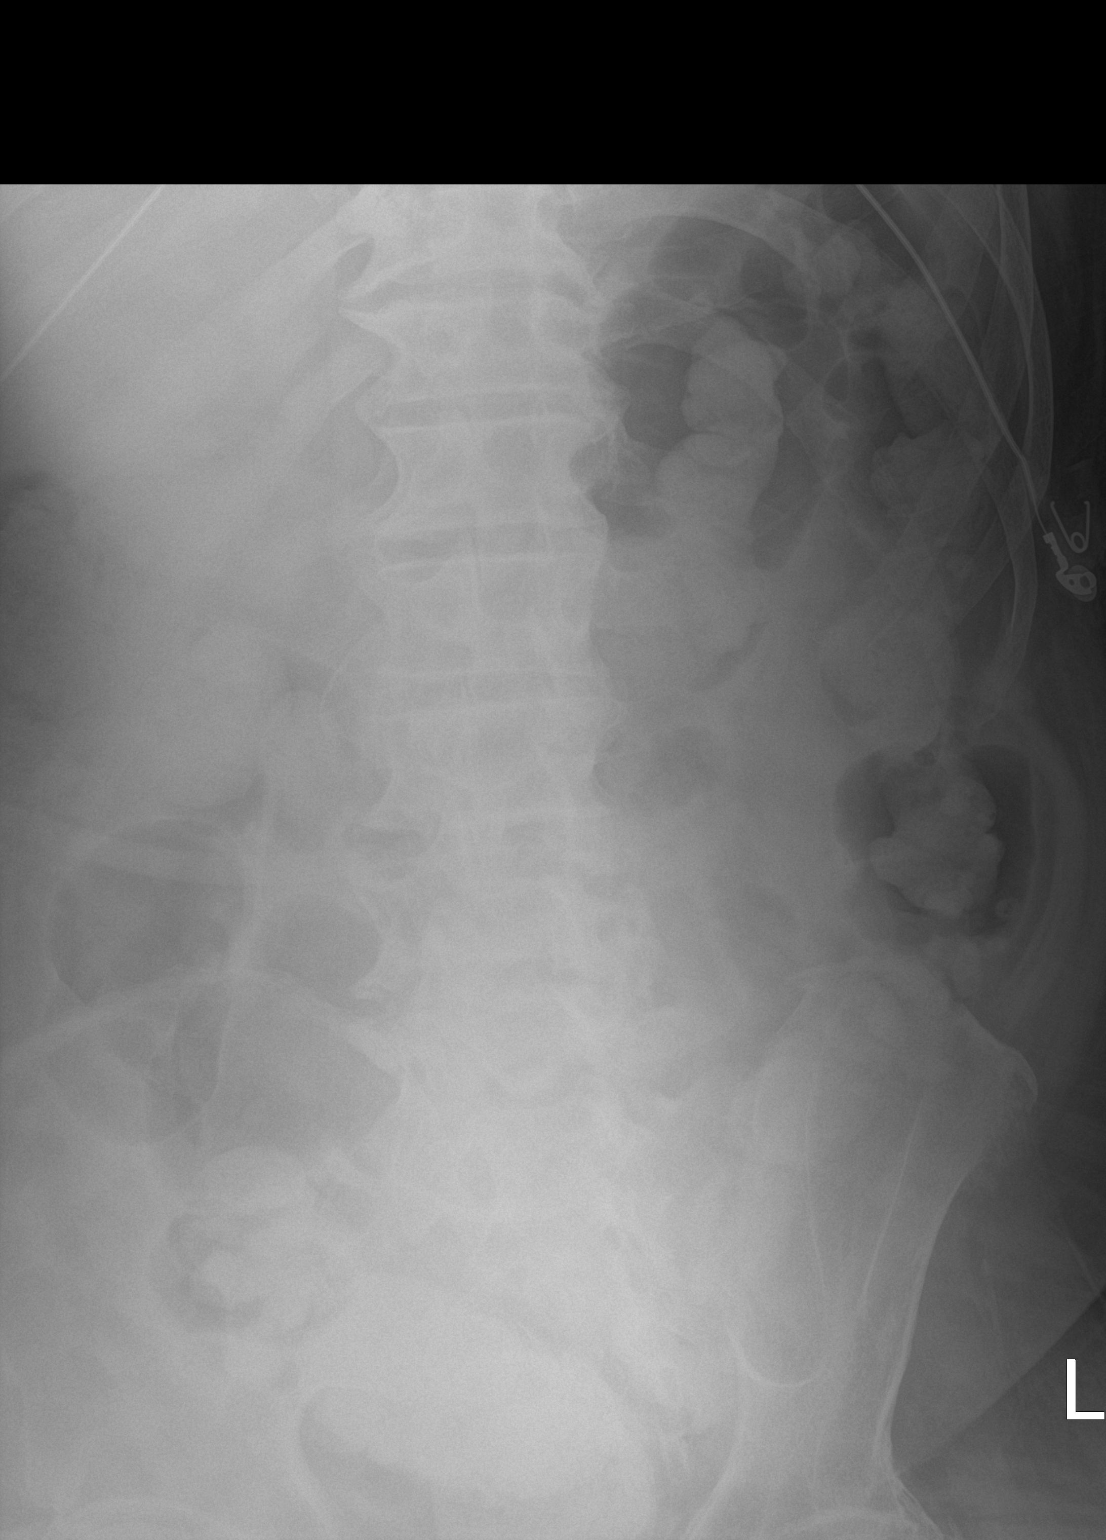

[1 of 1 positions shown; findings below may reference images not displayed]

FINDINGS: The bowel gas pattern is normal. No radio-opaque calculi or other
significant radiographic abnormality are seen.
IMPRESSION: Negative exam.

## 2021-01-16 LAB — BASIC METABOLIC PANEL
Anion gap: 8 (ref 5–15)
BUN: 35 mg/dL — ABNORMAL HIGH (ref 8–23)
CO2: 30 mmol/L (ref 22–32)
Calcium: 10.6 mg/dL — ABNORMAL HIGH (ref 8.9–10.3)
Chloride: 108 mmol/L (ref 98–111)
Creatinine, Ser: 0.89 mg/dL (ref 0.61–1.24)
GFR, Estimated: >60 mL/min (ref >60)
Glucose, Bld: 154 mg/dL — ABNORMAL HIGH (ref 70–99)
Potassium: 3.2 mmol/L — ABNORMAL LOW (ref 3.5–5.1)
Sodium: 146 mmol/L — ABNORMAL HIGH (ref 135–145)

## 2021-01-16 LAB — PHOSPHORUS: Phosphorus: 2.9 mg/dL (ref 2.5–4.6)

## 2021-01-16 LAB — VANCOMYCIN, TROUGH: Vancomycin Tr: 15 ug/mL (ref 15–20)

## 2021-01-16 LAB — MAGNESIUM: Magnesium: 2.2 mg/dL (ref 1.7–2.4)

## 2021-01-17 LAB — CBC
HCT: 34.3 % — ABNORMAL LOW (ref 39.0–52.0)
Hemoglobin: 10.7 g/dL — ABNORMAL LOW (ref 13.0–17.0)
MCH: 28.6 pg (ref 26.0–34.0)
MCHC: 31.2 g/dL (ref 30.0–36.0)
MCV: 91.7 fL (ref 80.0–100.0)
Platelets: 148 10*3/uL — ABNORMAL LOW (ref 150–400)
RBC: 3.74 MIL/uL — ABNORMAL LOW (ref 4.22–5.81)
RDW: 16.3 % — ABNORMAL HIGH (ref 11.5–15.5)
WBC: 9 10*3/uL (ref 4.0–10.5)
nRBC: 0 % (ref 0.0–0.2)

## 2021-01-17 LAB — BASIC METABOLIC PANEL
Anion gap: 6 (ref 5–15)
BUN: 40 mg/dL — ABNORMAL HIGH (ref 8–23)
CO2: 30 mmol/L (ref 22–32)
Calcium: 10.7 mg/dL — ABNORMAL HIGH (ref 8.9–10.3)
Chloride: 111 mmol/L (ref 98–111)
Creatinine, Ser: 0.86 mg/dL (ref 0.61–1.24)
GFR, Estimated: >60 mL/min (ref >60)
Glucose, Bld: 117 mg/dL — ABNORMAL HIGH (ref 70–99)
Potassium: 3.8 mmol/L (ref 3.5–5.1)
Sodium: 147 mmol/L — ABNORMAL HIGH (ref 135–145)

## 2021-01-17 LAB — MAGNESIUM: Magnesium: 2.2 mg/dL (ref 1.7–2.4)

## 2021-01-17 LAB — PHOSPHORUS: Phosphorus: 3.2 mg/dL (ref 2.5–4.6)

## 2021-01-17 LAB — SEDIMENTATION RATE: Sed Rate: 68 mm/hr — ABNORMAL HIGH (ref 0–16)

## 2021-01-17 LAB — C-REACTIVE PROTEIN: CRP: 3.2 mg/dL — ABNORMAL HIGH (ref <1.0)

## 2021-01-18 ENCOUNTER — Other Ambulatory Visit (HOSPITAL_COMMUNITY): Payer: BC Managed Care – PPO

## 2021-01-18 LAB — RENAL FUNCTION PANEL
Albumin: 2.7 g/dL — ABNORMAL LOW (ref 3.5–5.0)
Anion gap: 7 (ref 5–15)
BUN: 46 mg/dL — ABNORMAL HIGH (ref 8–23)
CO2: 30 mmol/L (ref 22–32)
Calcium: 10.6 mg/dL — ABNORMAL HIGH (ref 8.9–10.3)
Chloride: 108 mmol/L (ref 98–111)
Creatinine, Ser: 1.01 mg/dL (ref 0.61–1.24)
GFR, Estimated: >60 mL/min (ref >60)
Glucose, Bld: 127 mg/dL — ABNORMAL HIGH (ref 70–99)
Phosphorus: 2.7 mg/dL (ref 2.5–4.6)
Potassium: 3.5 mmol/L (ref 3.5–5.1)
Sodium: 145 mmol/L (ref 135–145)

## 2021-01-18 LAB — CBC
HCT: 34.6 % — ABNORMAL LOW (ref 39.0–52.0)
Hemoglobin: 10.9 g/dL — ABNORMAL LOW (ref 13.0–17.0)
MCH: 29 pg (ref 26.0–34.0)
MCHC: 31.5 g/dL (ref 30.0–36.0)
MCV: 92 fL (ref 80.0–100.0)
Platelets: 154 10*3/uL (ref 150–400)
RBC: 3.76 MIL/uL — ABNORMAL LOW (ref 4.22–5.81)
RDW: 16.3 % — ABNORMAL HIGH (ref 11.5–15.5)
WBC: 7.8 10*3/uL (ref 4.0–10.5)
nRBC: 0 % (ref 0.0–0.2)

## 2021-01-18 LAB — MAGNESIUM: Magnesium: 2.3 mg/dL (ref 1.7–2.4)

## 2021-01-18 IMAGING — DX DG CHEST 1V PORT
1 series · 1 of 1 positions shown · non-contrast
Comparison: [DATE].

CLINICAL DATA: Pneumonia.  Tracheostomy.

EXAM:
PORTABLE CHEST 1 VIEW

[chest]
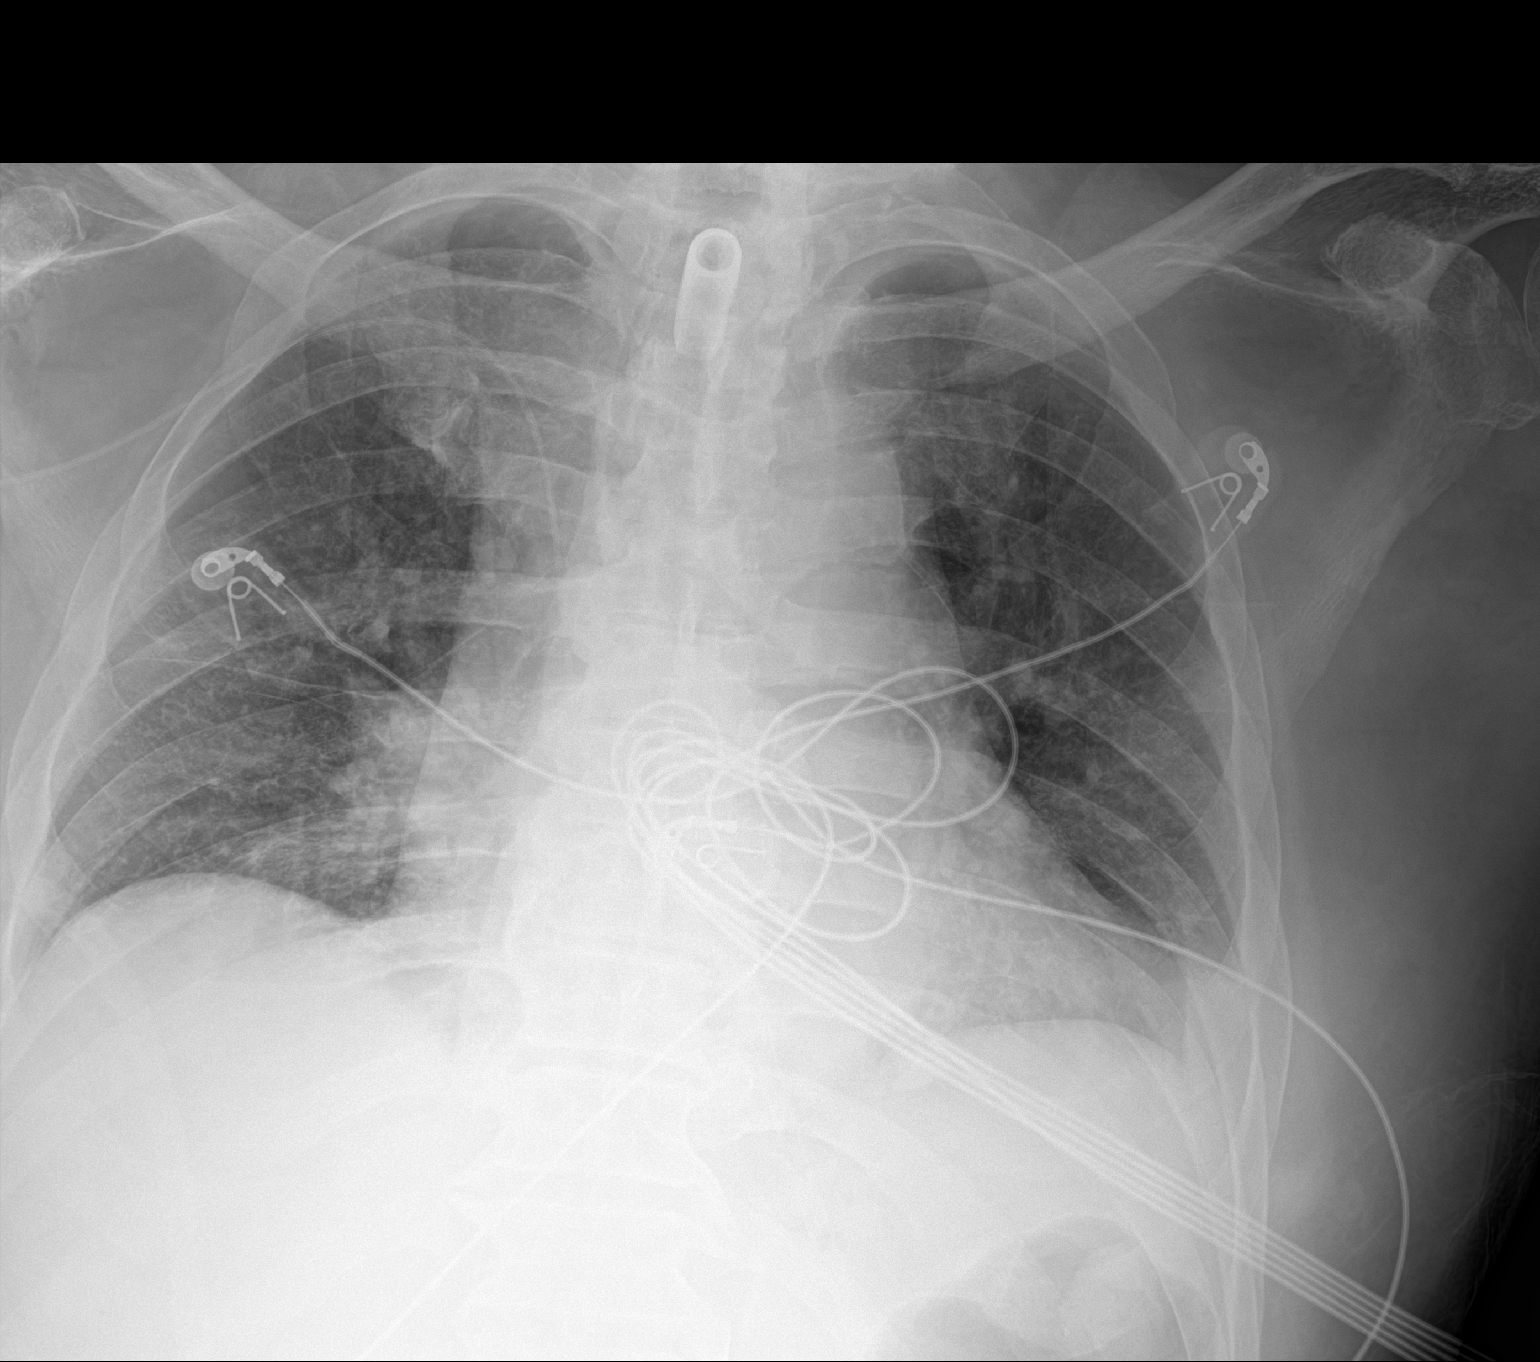

[1 of 1 positions shown; findings below may reference images not displayed]

FINDINGS: Tracheostomy tube and right PICC line in stable position.
Cardiomegaly again noted. Slight progression of bibasilar
atelectasis and infiltrates/edema. Small left pleural effusion
noted. No pneumothorax. Prior cervical fusion.
IMPRESSION: 1.  Tracheostomy tube and right PICC line stable position.

2.  Cardiomegaly again noted.

3. Slight progression of bibasilar atelectasis and
infiltrates/edema. Small left pleural effusion.

## 2021-01-19 LAB — BASIC METABOLIC PANEL
Anion gap: 9 (ref 5–15)
BUN: 44 mg/dL — ABNORMAL HIGH (ref 8–23)
CO2: 30 mmol/L (ref 22–32)
Calcium: 10.5 mg/dL — ABNORMAL HIGH (ref 8.9–10.3)
Chloride: 112 mmol/L — ABNORMAL HIGH (ref 98–111)
Creatinine, Ser: 0.98 mg/dL (ref 0.61–1.24)
GFR, Estimated: >60 mL/min (ref >60)
Glucose, Bld: 153 mg/dL — ABNORMAL HIGH (ref 70–99)
Potassium: 3.4 mmol/L — ABNORMAL LOW (ref 3.5–5.1)
Sodium: 151 mmol/L — ABNORMAL HIGH (ref 135–145)

## 2021-01-19 LAB — MAGNESIUM: Magnesium: 2.3 mg/dL (ref 1.7–2.4)

## 2021-01-19 LAB — PHOSPHORUS: Phosphorus: 3.6 mg/dL (ref 2.5–4.6)

## 2021-01-20 LAB — CBC
HCT: 36.3 % — ABNORMAL LOW (ref 39.0–52.0)
Hemoglobin: 11 g/dL — ABNORMAL LOW (ref 13.0–17.0)
MCH: 28.4 pg (ref 26.0–34.0)
MCHC: 30.3 g/dL (ref 30.0–36.0)
MCV: 93.8 fL (ref 80.0–100.0)
Platelets: 171 10*3/uL (ref 150–400)
RBC: 3.87 MIL/uL — ABNORMAL LOW (ref 4.22–5.81)
RDW: 16.6 % — ABNORMAL HIGH (ref 11.5–15.5)
WBC: 7.9 10*3/uL (ref 4.0–10.5)
nRBC: 0 % (ref 0.0–0.2)

## 2021-01-20 LAB — RENAL FUNCTION PANEL
Albumin: 2.9 g/dL — ABNORMAL LOW (ref 3.5–5.0)
Anion gap: 9 (ref 5–15)
BUN: 45 mg/dL — ABNORMAL HIGH (ref 8–23)
CO2: 32 mmol/L (ref 22–32)
Calcium: 10.8 mg/dL — ABNORMAL HIGH (ref 8.9–10.3)
Chloride: 111 mmol/L (ref 98–111)
Creatinine, Ser: 0.9 mg/dL (ref 0.61–1.24)
GFR, Estimated: >60 mL/min (ref >60)
Glucose, Bld: 140 mg/dL — ABNORMAL HIGH (ref 70–99)
Phosphorus: 3.5 mg/dL (ref 2.5–4.6)
Potassium: 3.8 mmol/L (ref 3.5–5.1)
Sodium: 152 mmol/L — ABNORMAL HIGH (ref 135–145)

## 2021-01-20 LAB — MAGNESIUM: Magnesium: 2.5 mg/dL — ABNORMAL HIGH (ref 1.7–2.4)

## 2021-01-21 ENCOUNTER — Other Ambulatory Visit (HOSPITAL_COMMUNITY): Payer: BC Managed Care – PPO

## 2021-01-21 LAB — BASIC METABOLIC PANEL
Anion gap: 6 (ref 5–15)
BUN: 41 mg/dL — ABNORMAL HIGH (ref 8–23)
CO2: 31 mmol/L (ref 22–32)
Calcium: 10.4 mg/dL — ABNORMAL HIGH (ref 8.9–10.3)
Chloride: 111 mmol/L (ref 98–111)
Creatinine, Ser: 0.86 mg/dL (ref 0.61–1.24)
GFR, Estimated: >60 mL/min (ref >60)
Glucose, Bld: 150 mg/dL — ABNORMAL HIGH (ref 70–99)
Potassium: 3.6 mmol/L (ref 3.5–5.1)
Sodium: 148 mmol/L — ABNORMAL HIGH (ref 135–145)

## 2021-01-21 LAB — MAGNESIUM: Magnesium: 2.6 mg/dL — ABNORMAL HIGH (ref 1.7–2.4)

## 2021-01-21 LAB — CULTURE, BLOOD (ROUTINE X 2)
Culture: NO GROWTH
Culture: NO GROWTH

## 2021-01-21 LAB — PHOSPHORUS: Phosphorus: 3.2 mg/dL (ref 2.5–4.6)

## 2021-01-21 IMAGING — DX DG CHEST 1V PORT
1 series · 1 of 1 positions shown · non-contrast
Comparison: [DATE]

CLINICAL DATA: Pneumonia, history of hypertension

EXAM:
PORTABLE CHEST 1 VIEW

[chest]
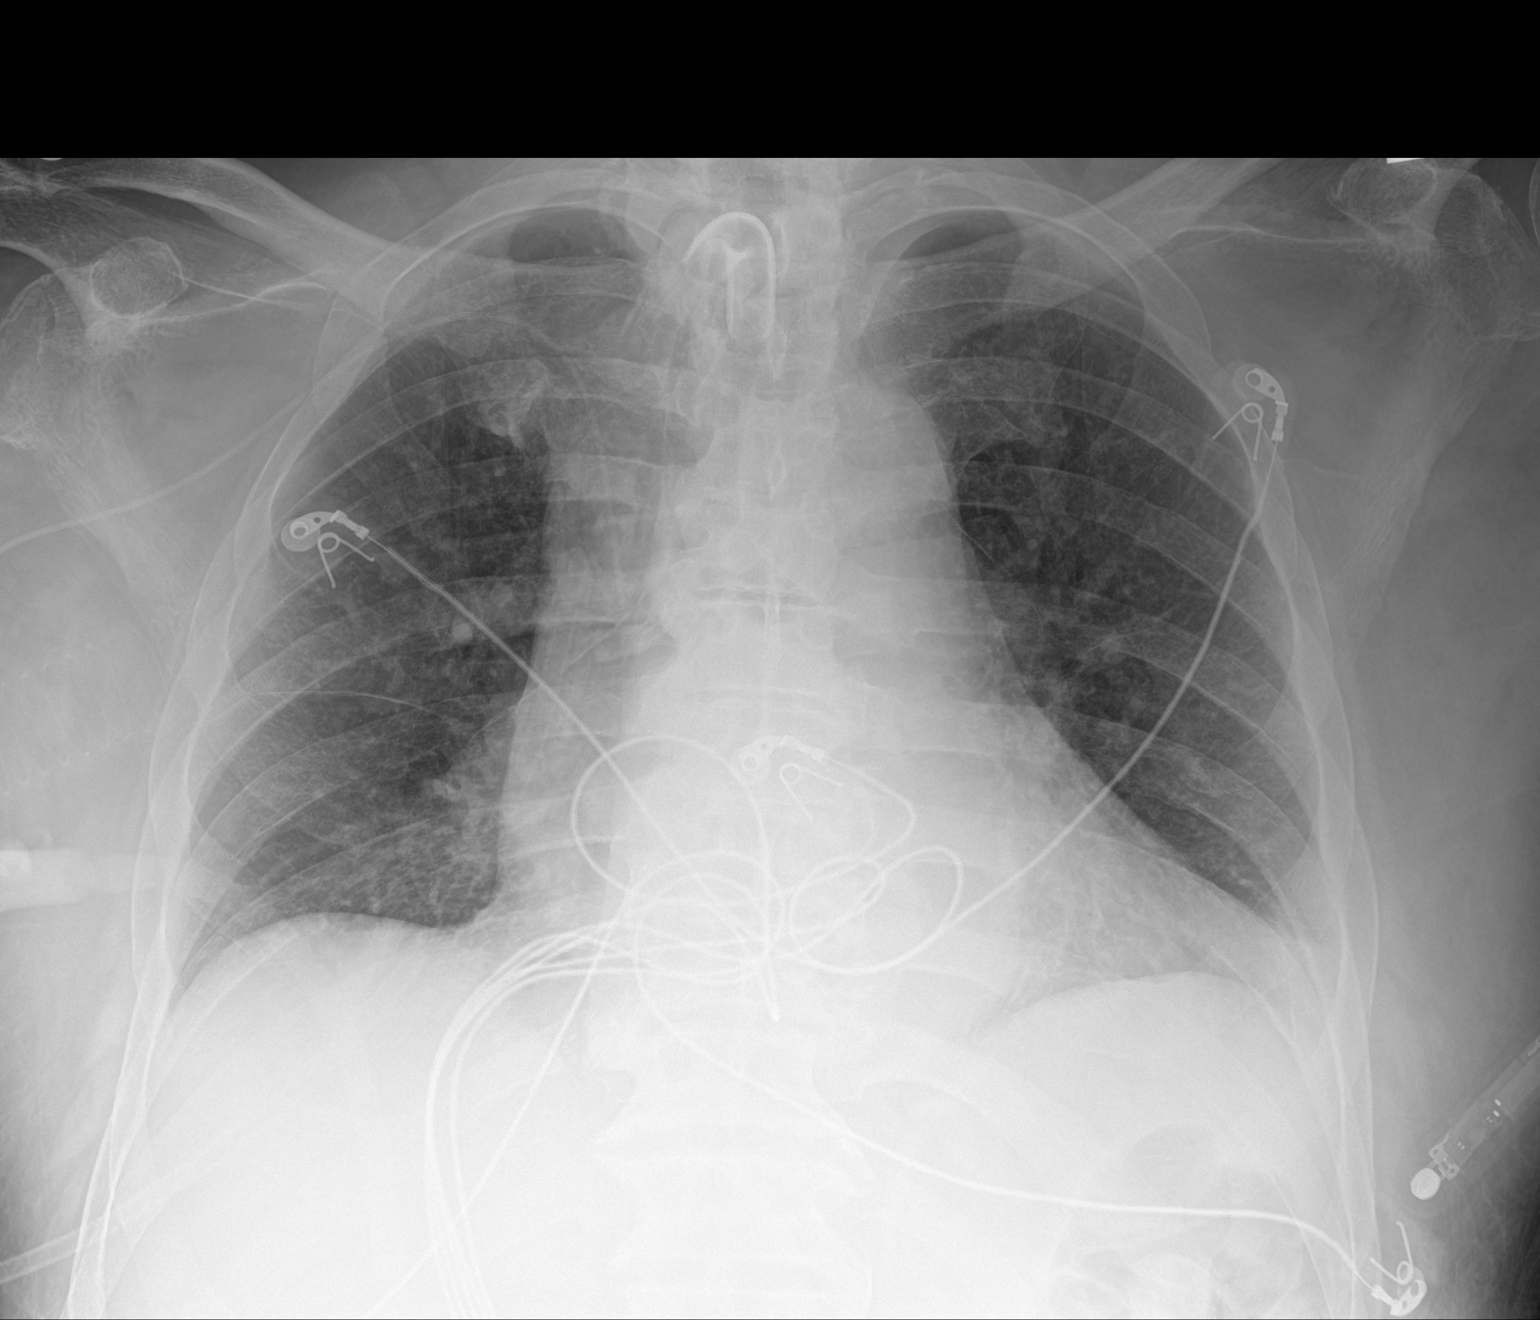

[1 of 1 positions shown; findings below may reference images not displayed]

FINDINGS: Tracheostomy tube tip terminates in the mid trachea approximately
6.1 cm from the carina.

Right upper extremity PICC tip terminates in the mid SVC.

Telemetry leads and external support devices overlie the chest.

Some persistent bilateral interstitial and hazy opacities most
pronounced in the mid to lower lungs with slight fissural thickening
on the right. Small left pleural effusion remains. Stable
cardiomediastinal contours with cardiomegaly. No acute osseous or
soft tissue abnormality.
IMPRESSION: 1. Lines and tubes as above.
2. Stable cardiomegaly
3. Unchanged bibasilar interstitial and hazy opacities, may reflect
a combination of atelectasis and edema with small left effusion.

## 2021-01-22 LAB — BLOOD GAS, ARTERIAL
Acid-Base Excess: 5.1 mmol/L — ABNORMAL HIGH (ref 0.0–2.0)
Bicarbonate: 29.1 mmol/L — ABNORMAL HIGH (ref 20.0–28.0)
FIO2: 28
O2 Saturation: 97.8 %
Patient temperature: 36.1
pCO2 arterial: 41.6 mmHg (ref 32.0–48.0)
pH, Arterial: 7.455 — ABNORMAL HIGH (ref 7.350–7.450)
pO2, Arterial: 94.5 mmHg (ref 83.0–108.0)

## 2021-01-22 LAB — VANCOMYCIN, TROUGH: Vancomycin Tr: 18 ug/mL (ref 15–20)

## 2021-01-24 LAB — CBC
HCT: 32.5 % — ABNORMAL LOW (ref 39.0–52.0)
Hemoglobin: 10.4 g/dL — ABNORMAL LOW (ref 13.0–17.0)
MCH: 28.7 pg (ref 26.0–34.0)
MCHC: 32 g/dL (ref 30.0–36.0)
MCV: 89.5 fL (ref 80.0–100.0)
Platelets: 177 10*3/uL (ref 150–400)
RBC: 3.63 MIL/uL — ABNORMAL LOW (ref 4.22–5.81)
RDW: 16 % — ABNORMAL HIGH (ref 11.5–15.5)
WBC: 6.3 10*3/uL (ref 4.0–10.5)
nRBC: 0 % (ref 0.0–0.2)

## 2021-01-24 LAB — BASIC METABOLIC PANEL
Anion gap: 8 (ref 5–15)
BUN: 31 mg/dL — ABNORMAL HIGH (ref 8–23)
CO2: 29 mmol/L (ref 22–32)
Calcium: 10.1 mg/dL (ref 8.9–10.3)
Chloride: 107 mmol/L (ref 98–111)
Creatinine, Ser: 0.91 mg/dL (ref 0.61–1.24)
GFR, Estimated: >60 mL/min (ref >60)
Glucose, Bld: 131 mg/dL — ABNORMAL HIGH (ref 70–99)
Potassium: 3.3 mmol/L — ABNORMAL LOW (ref 3.5–5.1)
Sodium: 144 mmol/L (ref 135–145)

## 2021-01-25 NOTE — Consult Note (Signed)
Infectious Disease Consultation   Evelyn Moch  TRR:[id number]  DOB: July 09, 1951  DOA: 01/05/2021  Requesting physician: Dr. Laren Everts  Reason for consultation: Antibiotic recommendations   History of Present Illness: Ryan Day is an 70 y.o. male with medical history significant for quadriplegia status post mechanical fall at home with C3-6 laminectomy, chronic tracheostomy, hypertension, hyperlipidemia.  He was admitted on 12/07/2020 when he presented to [facility] emergency room with hematuria.  Patient apparently had a Foley catheter placed 2 weeks prior to the presentation and completed treatment with 7-day course of antibiotic therapy for the UTI.  However, despite that he started having hematuria with tachycardia therefore was brought to the acute facility.  He was found to have oxygen saturation 75-80% on room air therefore placed on nonrebreather.  He was also found to have mucous plugging, fever of 101.7, hypotensive with systolic blood pressure in the 60s.  Sepsis protocol was initiated.  He was started on empiric IV vancomycin, Zosyn, cefepime.  He was placed on mechanical ventilation.  Patient apparently failed spontaneous breathing trial. He also had worsening encephalopathy and became unresponsive unable to communicate.  Head CT showed mild chronic ischemic white matter disease.  His blood cultures from 12/07/2020 showed MRSA, Enterobacter cloacae and Enterobacter faecalis.  Repeat blood cultures from 12/13/2020 did not show any growth.  He also had BAL with cultures that showed Pseudomonas aeruginosa, MRSA, Enterococcus faecalis.  Urine culture showed Enterobacter cloacae.  He has a sacral wound and sacral wound culture showed Pseudomonas, rare MRSA, few bacteria voids, rare Candida albicans.  Infectious disease was consulted.  Patient on treatment with IV vancomycin, cefepime.  Due to his complex medical problems he was transferred and admitted to [facility].  He had MRI of his  lumbar spine done on 01/08/2021 which per report showed findings concerning for osteomyelitis. He is currently on treatment with IV vancomycin, ciprofloxacin.  Review of Systems:  He is opening eyes but nonverbal, currently encephalopathic.  Unable to obtain review of systems at this time.  Past Medical History: Past Medical History:  Diagnosis Date  . Acute on chronic respiratory failure with hypoxia ([place])   . Acute renal injury due to hypovolemia ([place])   . Autonomic instability   . Cardiac arrest ([place])   . Cervical spinal cord injury, sequela ([place])   . Elevated alkaline phosphatase level   . 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 Hyperlipidemia   . Hypertension     Past Surgical History: Past Surgical History:  Procedure Laterality Date  . APPLICATION OF WOUND VAC N/A 08/16/2020   Procedure: APPLICATION OF WOUND VAC;  Surgeon: Fredirick Maudlin, MD;  Location: [facility] ORS;  Service: General;  Laterality: N/A;  . COLONOSCOPY WITH PROPOFOL N/A 12/05/2017   Procedure: COLONOSCOPY WITH PROPOFOL;  Surgeon: Lin Landsman, MD;  Location: [facility] ENDOSCOPY;  Service: Gastroenterology;  Laterality: N/A;  . ESOPHAGOGASTRODUODENOSCOPY  12/05/2017   Procedure: ESOPHAGOGASTRODUODENOSCOPY (EGD);  Surgeon: Lin Landsman, MD;  Location: [place];  Service: Gastroenterology;;  . INCISION AND DRAINAGE ABSCESS N/A 08/16/2020   Procedure: INCISION AND DRAINAGE ABSCESS, SACRAL;  Surgeon: Fredirick Maudlin, MD;  Location: [facility] ORS;  Service: General;  Laterality: N/A;     Allergies:   Allergies  Allergen Reactions  . Shrimp (Diagnostic)     Experiences facial droop when eating seafood     Social History:  reports that he has never smoked. He has never  used smokeless tobacco. He reports previous alcohol use. He reports that he does not use drugs.   Family History: Family History  Problem Relation Age of Onset  . Cancer Father        lung  . Cancer Brother         throat  . Heart disease Brother 19  . Liver cancer Brother   . Alcohol abuse Brother     Physical Exam: Vitals: Temperature 97, pulse 64, respiratory rate 18, blood pressure 136/77, pulse oximetry 100% Constitutional: Chronically ill-appearing male, opening eyes but nonverbal. Head: Atraumatic, normocephalic Eyes: PERLA, EOMI, irises appear normal, anicteric sclera  ENMT: external ears and nose appear normal, normal hearing, Lips appears normal, moist oral mucosa Neck: Has trach in place CVS: S1-S2  Respiratory: Coarse breath sounds, rhonchi Abdomen: soft, positive bowel sounds, PEG tube in place Musculoskeletal: Quadriplegic Neuro: Quadriplegic Psych: Encephalopathic Skin: Stage IV sacrococcygeal pressure ulcer, unstageable right and left heel pressure ulcer  Data reviewed:  I have personally reviewed following labs and imaging studies Labs:  CBC: Recent Labs  Lab 01/20/21 0432 01/24/21 0425  WBC 7.9 6.3  HGB 11.0* 10.4*  HCT 36.3* 32.5*  MCV 93.8 89.5  PLT 171 024    Basic Metabolic Panel: Recent Labs  Lab 01/19/21 0559 01/20/21 0432 01/20/21 0446 01/21/21 0333 01/24/21 0425  NA 151*  --  152* 148* 144  K 3.4*  --  3.8 3.6 3.3*  CL 112*  --  111 111 107  CO2 30  --  32 31 29  GLUCOSE 153*  --  140* 150* 131*  BUN 44*  --  45* 41* 31*  CREATININE 0.98  --  0.90 0.86 0.91  CALCIUM 10.5*  --  10.8* 10.4* 10.1  MG 2.3 2.5*  --  2.6*  --   PHOS 3.6  --  3.5 3.2  --    GFR CrCl cannot be calculated (Unknown ideal weight.). Liver Function Tests: Recent Labs  Lab 01/20/21 0446  ALBUMIN 2.9*   No results for input(s): LIPASE, AMYLASE in the last 168 hours. No results for input(s): AMMONIA in the last 168 hours. Coagulation profile No results for input(s): INR, PROTIME in the last 168 hours.  Cardiac Enzymes: No results for input(s): CKTOTAL, CKMB, CKMBINDEX, TROPONINI in the last 168 hours. BNP: Invalid input(s): POCBNP CBG: No results for input(s):  GLUCAP in the last 168 hours. D-Dimer No results for input(s): DDIMER in the last 72 hours. Hgb A1c No results for input(s): HGBA1C in the last 72 hours. Lipid Profile No results for input(s): CHOL, HDL, LDLCALC, TRIG, CHOLHDL, LDLDIRECT in the last 72 hours. Thyroid function studies No results for input(s): TSH, T4TOTAL, T3FREE, THYROIDAB in the last 72 hours.  Invalid input(s): FREET3 Anemia work up No results for input(s): VITAMINB12, FOLATE, FERRITIN, TIBC, IRON, RETICCTPCT in the last 72 hours. Urinalysis    Component Value Date/Time   COLORURINE YELLOW 01/06/2021 1549   APPEARANCEUR HAZY (A) 01/06/2021 1549   APPEARANCEUR Cloudy (A) 12/07/2020 1055   LABSPEC 1.013 01/06/2021 1549   PHURINE 7.0 01/06/2021 1549   GLUCOSEU NEGATIVE 01/06/2021 1549   HGBUR NEGATIVE 01/06/2021 1549   BILIRUBINUR NEGATIVE 01/06/2021 1549   BILIRUBINUR Negative 12/07/2020 [place] 01/06/2021 1549   PROTEINUR 30 (A) 01/06/2021 1549   NITRITE NEGATIVE 01/06/2021 1549   LEUKOCYTESUR NEGATIVE 01/06/2021 1549     Sepsis Labs Invalid input(s): PROCALCITONIN,  WBC,  LACTICIDVEN Microbiology Recent Results (from the past 240  hour(s))  Culture, blood (routine x 2)     Status: None   Collection Time: 01/16/21  8:36 AM   Specimen: BLOOD LEFT HAND  Result Value Ref Range Status   Specimen Description BLOOD LEFT HAND  Final   Special Requests   Final    BOTTLES DRAWN AEROBIC AND ANAEROBIC Blood Culture results may not be optimal due to an inadequate volume of blood received in culture bottles   Culture   Final    NO GROWTH 5 DAYS Performed at [facility], [place] [address] [place], [place] [address]    Report Status 01/21/2021 FINAL  Final  Culture, blood (routine x 2)     Status: None   Collection Time: 01/16/21  8:40 AM   Specimen: BLOOD LEFT HAND  Result Value Ref Range Status   Specimen Description BLOOD LEFT HAND  Final   Special Requests   Final    BOTTLES DRAWN  AEROBIC AND ANAEROBIC Blood Culture results may not be optimal due to an inadequate volume of blood received in culture bottles   Culture   Final    NO GROWTH 5 DAYS Performed at [facility], [place] [address] [place], [place] [address]    Report Status 01/21/2021 FINAL  Final  Culture, Respiratory w Gram Stain     Status: None (Preliminary result)   Collection Time: 01/23/21  3:42 PM   Specimen: Tracheal Aspirate; Respiratory  Result Value Ref Range Status   Specimen Description TRACHEAL ASPIRATE  Final   Special Requests NONE  Final   Gram Stain   Final    ABUNDANT SQUAMOUS EPITHELIAL CELLS PRESENT ABUNDANT WBC PRESENT, PREDOMINANTLY PMN MODERATE GRAM NEGATIVE RODS Performed at [facility], [place] [address], [place], [place] [address]    Culture MODERATE PSEUDOMONAS AERUGINOSA  Final   Report Status PENDING  Incomplete       Inpatient Medications:   Please see MAR   Radiological Exams on Admission: No results found.  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 Bilateral pulmonary infiltrates being treated as HCAP.  He has received antibiotics at the acute facility.  Respiratory cultures from here showing Pseudomonas.  He had BAL cultures sent at the acute facility that also showed Pseudomonas.  He has been on treatment with IV vancomycin, cefepime.  Cefepime discontinued and now switched to ciprofloxacin.  Follow-up on the final respiratory cultures and adjust  antibiotics accordingly.  Suggest to continue treatment with IV vancomycin.  Once he receives treatment with 1 week of ciprofloxacin suggest to switch back to IV cefepime.  Continue treatment with IV vancomycin, cefepime with a tentative end date of 02/17/2021 for the osteomyelitis.  Unfortunately he has dysphagia and high risk for ongoing aspiration and recurrent mucous plugging and at risk for worsening respiratory failure despite being on antibiotics.  Please monitor BUN/creatinine closely while on antibiotics.   Pneumonia: He has had recurrent pneumonia secondary to mucous plugging at the acute facility.  As mentioned above he had BAL cultures that showed Pseudomonas at the acute facility.  He had the respiratory cultures preliminary report showing Pseudomonas.  Final results are still  pending at this time.  On antibiotics as mentioned above.  Follow-up on the final respiratory cultures and adjust antibiotics accordingly.  Unfortunately as mentioned above due to his dysphagia, high suspicion for ongoing aspiration and recurrent mucous plugging he is at risk for worsening respiratory failure despite being on treatment with antibiotics.  Polymicrobial bacteremia with sepsis: MRSA, E faecalis, Enterobacter cloacae in blood culture at the acute facility.  He has multiple sources including unstageable pressure ulcers, stage IV sacral pressure ulcer with osteomyelitis which could be contributing to the bacteremia.  On antibiotics as mentioned above.  If he starts having worsening fevers, worsening leukocytosis suggest to send for repeat pancultures.  Unfortunately he also has urinary retention and requiring Foley catheter.  He had urine cultures at the acute facility that showed Enterobacter.  Due to the Foley catheter high risk for recurrent UTI.  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  Once he completes treatment  with ciprofloxacin suggest to switch to IV cefepime.  Suggest to treat with IV vancomycin, cefepime with tentative end date 02/17/2021 for the osteomyelitis total of 6 weeks.  Continue local wound care.  He may benefit from a diverting colostomy to help with wound healing.  Unfortunately due to his quadriplegia and immobility he is very high risk for worsening of the pressure ulcers despite the best treatment.  Quadriplegia following a fall and fracture of C5 with cord compression: Unfortunately due to his quadriplegia and debility he is high risk for worsening of the pressure ulcers.  He is status post C3-C6 laminectomy/posterior instrumentation/fusion in August 2021.  Continue therapy and supportive management per primary team.  Neurogenic bladder with Foley catheter: Because of this he is high risk for recurrent UTIs.  Encephalopathy: Continue supportive management per the primary team.  Dysphagia/protein calorie malnutrition: Due to his dysphagia he is high risk for recurrent aspiration and aspiration pneumonia despite being on antibiotics.  He is also high risk for ongoing mucous plugging and worsening respiratory failure.  Management of PCM per primary team.  Status post cardiac arrest: continue medications and management per the primary team.  Unfortunately due to his multiple complex medical problems he is very high risk for worsening and decompensation.  Per primary team discussions ongoing with the family for further goals of care. Thank you for involving [place] in the care of this patient.  Plan of care discussed with the primary team and pharmacy.   Yaakov Guthrie M.D. 01/25/2021, 5:04 PM

## 2021-01-27 LAB — BASIC METABOLIC PANEL
Anion gap: 7 (ref 5–15)
BUN: 17 mg/dL (ref 8–23)
CO2: 30 mmol/L (ref 22–32)
Calcium: 9.8 mg/dL (ref 8.9–10.3)
Chloride: 102 mmol/L (ref 98–111)
Creatinine, Ser: 0.86 mg/dL (ref 0.61–1.24)
GFR, Estimated: >60 mL/min (ref >60)
Glucose, Bld: 130 mg/dL — ABNORMAL HIGH (ref 70–99)
Potassium: 3.3 mmol/L — ABNORMAL LOW (ref 3.5–5.1)
Sodium: 139 mmol/L (ref 135–145)

## 2021-01-27 LAB — CULTURE, RESPIRATORY W GRAM STAIN

## 2021-01-27 LAB — CBC
HCT: 30.5 % — ABNORMAL LOW (ref 39.0–52.0)
Hemoglobin: 9.7 g/dL — ABNORMAL LOW (ref 13.0–17.0)
MCH: 29 pg (ref 26.0–34.0)
MCHC: 31.8 g/dL (ref 30.0–36.0)
MCV: 91 fL (ref 80.0–100.0)
Platelets: 186 10*3/uL (ref 150–400)
RBC: 3.35 MIL/uL — ABNORMAL LOW (ref 4.22–5.81)
RDW: 16.3 % — ABNORMAL HIGH (ref 11.5–15.5)
WBC: 5.9 10*3/uL (ref 4.0–10.5)
nRBC: 0 % (ref 0.0–0.2)

## 2021-01-27 LAB — MAGNESIUM: Magnesium: 2.2 mg/dL (ref 1.7–2.4)

## 2021-01-28 LAB — POTASSIUM: Potassium: 3.6 mmol/L (ref 3.5–5.1)

## 2021-01-28 LAB — VANCOMYCIN, TROUGH: Vancomycin Tr: 15 ug/mL (ref 15–20)

## 2021-01-29 LAB — CBC
HCT: 31.5 % — ABNORMAL LOW (ref 39.0–52.0)
Hemoglobin: 10.2 g/dL — ABNORMAL LOW (ref 13.0–17.0)
MCH: 29 pg (ref 26.0–34.0)
MCHC: 32.4 g/dL (ref 30.0–36.0)
MCV: 89.5 fL (ref 80.0–100.0)
Platelets: 189 10*3/uL (ref 150–400)
RBC: 3.52 MIL/uL — ABNORMAL LOW (ref 4.22–5.81)
RDW: 16 % — ABNORMAL HIGH (ref 11.5–15.5)
WBC: 6.3 10*3/uL (ref 4.0–10.5)
nRBC: 0 % (ref 0.0–0.2)

## 2021-01-29 LAB — BASIC METABOLIC PANEL
Anion gap: 7 (ref 5–15)
BUN: 33 mg/dL — ABNORMAL HIGH (ref 8–23)
CO2: 31 mmol/L (ref 22–32)
Calcium: 10.5 mg/dL — ABNORMAL HIGH (ref 8.9–10.3)
Chloride: 104 mmol/L (ref 98–111)
Creatinine, Ser: 0.88 mg/dL (ref 0.61–1.24)
GFR, Estimated: >60 mL/min (ref >60)
Glucose, Bld: 106 mg/dL — ABNORMAL HIGH (ref 70–99)
Potassium: 3.7 mmol/L (ref 3.5–5.1)
Sodium: 142 mmol/L (ref 135–145)

## 2021-01-29 LAB — MAGNESIUM: Magnesium: 2.2 mg/dL (ref 1.7–2.4)

## 2021-01-29 LAB — PHOSPHORUS: Phosphorus: 3.1 mg/dL (ref 2.5–4.6)

## 2021-02-04 ENCOUNTER — Inpatient Hospital Stay
Admission: EM | Admit: 2021-02-04 | Discharge: 2021-02-12 | DRG: 640 | Disposition: A | Discharge status: Hospice | Payer: Medicare Other | Attending: Internal Medicine | Admitting: Internal Medicine

## 2021-02-04 ENCOUNTER — Emergency Department: Payer: Medicare Other

## 2021-02-04 ENCOUNTER — Other Ambulatory Visit: Payer: Self-pay

## 2021-02-04 ENCOUNTER — Observation Stay: Payer: Medicare Other

## 2021-02-04 DIAGNOSIS — S31000A Unspecified open wound of lower back and pelvis without penetration into retroperitoneum, initial encounter: Secondary | ICD-10-CM

## 2021-02-04 DIAGNOSIS — T80212A Local infection due to central venous catheter, initial encounter: Secondary | ICD-10-CM | POA: Diagnosis present

## 2021-02-04 DIAGNOSIS — R739 Hyperglycemia, unspecified: Secondary | ICD-10-CM

## 2021-02-04 DIAGNOSIS — Z20822 Contact with and (suspected) exposure to covid-19: Secondary | ICD-10-CM | POA: Diagnosis present

## 2021-02-04 DIAGNOSIS — S14109S Unspecified injury at unspecified level of cervical spinal cord, sequela: Secondary | ICD-10-CM | POA: Diagnosis not present

## 2021-02-04 DIAGNOSIS — N179 Acute kidney failure, unspecified: Secondary | ICD-10-CM | POA: Diagnosis not present

## 2021-02-04 DIAGNOSIS — Z931 Gastrostomy status: Secondary | ICD-10-CM

## 2021-02-04 DIAGNOSIS — L89150 Pressure ulcer of sacral region, unstageable: Secondary | ICD-10-CM

## 2021-02-04 DIAGNOSIS — J9611 Chronic respiratory failure with hypoxia: Secondary | ICD-10-CM | POA: Diagnosis present

## 2021-02-04 DIAGNOSIS — Z79899 Other long term (current) drug therapy: Secondary | ICD-10-CM

## 2021-02-04 DIAGNOSIS — E878 Other disorders of electrolyte and fluid balance, not elsewhere classified: Secondary | ICD-10-CM | POA: Diagnosis present

## 2021-02-04 DIAGNOSIS — Z794 Long term (current) use of insulin: Secondary | ICD-10-CM

## 2021-02-04 DIAGNOSIS — G8250 Quadriplegia, unspecified: Secondary | ICD-10-CM | POA: Diagnosis present

## 2021-02-04 DIAGNOSIS — K592 Neurogenic bowel, not elsewhere classified: Secondary | ICD-10-CM | POA: Diagnosis present

## 2021-02-04 DIAGNOSIS — Y92009 Unspecified place in unspecified non-institutional (private) residence as the place of occurrence of the external cause: Secondary | ICD-10-CM

## 2021-02-04 DIAGNOSIS — L89154 Pressure ulcer of sacral region, stage 4: Secondary | ICD-10-CM

## 2021-02-04 DIAGNOSIS — E870 Hyperosmolality and hypernatremia: Secondary | ICD-10-CM | POA: Diagnosis not present

## 2021-02-04 DIAGNOSIS — Z8249 Family history of ischemic heart disease and other diseases of the circulatory system: Secondary | ICD-10-CM

## 2021-02-04 DIAGNOSIS — F32A Depression, unspecified: Secondary | ICD-10-CM | POA: Diagnosis present

## 2021-02-04 DIAGNOSIS — N390 Urinary tract infection, site not specified: Secondary | ICD-10-CM

## 2021-02-04 DIAGNOSIS — Z7901 Long term (current) use of anticoagulants: Secondary | ICD-10-CM

## 2021-02-04 DIAGNOSIS — Z800 Family history of malignant neoplasm of digestive organs: Secondary | ICD-10-CM

## 2021-02-04 DIAGNOSIS — Z430 Encounter for attention to tracheostomy: Secondary | ICD-10-CM

## 2021-02-04 DIAGNOSIS — M7989 Other specified soft tissue disorders: Secondary | ICD-10-CM

## 2021-02-04 DIAGNOSIS — F419 Anxiety disorder, unspecified: Secondary | ICD-10-CM | POA: Diagnosis present

## 2021-02-04 DIAGNOSIS — R1310 Dysphagia, unspecified: Secondary | ICD-10-CM | POA: Diagnosis present

## 2021-02-04 DIAGNOSIS — M4628 Osteomyelitis of vertebra, sacral and sacrococcygeal region: Secondary | ICD-10-CM | POA: Diagnosis present

## 2021-02-04 DIAGNOSIS — G4700 Insomnia, unspecified: Secondary | ICD-10-CM | POA: Diagnosis present

## 2021-02-04 DIAGNOSIS — Y831 Surgical operation with implant of artificial internal device as the cause of abnormal reaction of the patient, or of later complication, without mention of misadventure at the time of the procedure: Secondary | ICD-10-CM | POA: Diagnosis present

## 2021-02-04 DIAGNOSIS — T80219A Unspecified infection due to central venous catheter, initial encounter: Secondary | ICD-10-CM

## 2021-02-04 DIAGNOSIS — R748 Abnormal levels of other serum enzymes: Secondary | ICD-10-CM | POA: Diagnosis not present

## 2021-02-04 DIAGNOSIS — G903 Multi-system degeneration of the autonomic nervous system: Secondary | ICD-10-CM | POA: Diagnosis present

## 2021-02-04 DIAGNOSIS — L03113 Cellulitis of right upper limb: Secondary | ICD-10-CM | POA: Diagnosis present

## 2021-02-04 DIAGNOSIS — Z9981 Dependence on supplemental oxygen: Secondary | ICD-10-CM

## 2021-02-04 DIAGNOSIS — Z8674 Personal history of sudden cardiac arrest: Secondary | ICD-10-CM

## 2021-02-04 DIAGNOSIS — N319 Neuromuscular dysfunction of bladder, unspecified: Secondary | ICD-10-CM | POA: Diagnosis present

## 2021-02-04 DIAGNOSIS — I10 Essential (primary) hypertension: Secondary | ICD-10-CM | POA: Diagnosis present

## 2021-02-04 DIAGNOSIS — W1830XS Fall on same level, unspecified, sequela: Secondary | ICD-10-CM

## 2021-02-04 DIAGNOSIS — Z930 Tracheostomy status: Secondary | ICD-10-CM

## 2021-02-04 DIAGNOSIS — E860 Dehydration: Secondary | ICD-10-CM | POA: Diagnosis present

## 2021-02-04 DIAGNOSIS — L8990 Pressure ulcer of unspecified site, unspecified stage: Secondary | ICD-10-CM | POA: Diagnosis present

## 2021-02-04 DIAGNOSIS — L89153 Pressure ulcer of sacral region, stage 3: Secondary | ICD-10-CM | POA: Diagnosis present

## 2021-02-04 DIAGNOSIS — Z66 Do not resuscitate: Secondary | ICD-10-CM | POA: Diagnosis present

## 2021-02-04 DIAGNOSIS — L89620 Pressure ulcer of left heel, unstageable: Secondary | ICD-10-CM | POA: Diagnosis present

## 2021-02-04 DIAGNOSIS — E785 Hyperlipidemia, unspecified: Secondary | ICD-10-CM | POA: Diagnosis present

## 2021-02-04 DIAGNOSIS — Z7401 Bed confinement status: Secondary | ICD-10-CM

## 2021-02-04 DIAGNOSIS — S14153S Other incomplete lesion at C3 level of cervical spinal cord, sequela: Secondary | ICD-10-CM

## 2021-02-04 DIAGNOSIS — L89610 Pressure ulcer of right heel, unstageable: Secondary | ICD-10-CM | POA: Diagnosis present

## 2021-02-04 DIAGNOSIS — Z515 Encounter for palliative care: Secondary | ICD-10-CM

## 2021-02-04 HISTORY — DX: Paralytic syndrome, unspecified: G83.9

## 2021-02-04 HISTORY — DX: Tracheostomy status: Z93.0

## 2021-02-04 HISTORY — DX: Presence of urogenital implants: Z96.0

## 2021-02-04 HISTORY — DX: Gastrostomy status: Z93.1

## 2021-02-04 LAB — CBC WITH DIFFERENTIAL/PLATELET
Abs Immature Granulocytes: 0.04 10*3/uL (ref 0.00–0.07)
Basophils Absolute: 0 10*3/uL (ref 0.0–0.1)
Basophils Relative: 0 %
Eosinophils Absolute: 2 10*3/uL — ABNORMAL HIGH (ref 0.0–0.5)
Eosinophils Relative: 17 %
HCT: 37.3 % — ABNORMAL LOW (ref 39.0–52.0)
Hemoglobin: 11.5 g/dL — ABNORMAL LOW (ref 13.0–17.0)
Immature Granulocytes: 0 %
Lymphocytes Relative: 16 %
Lymphs Abs: 1.9 10*3/uL (ref 0.7–4.0)
MCH: 28.6 pg (ref 26.0–34.0)
MCHC: 30.8 g/dL (ref 30.0–36.0)
MCV: 92.8 fL (ref 80.0–100.0)
Monocytes Absolute: 1.3 10*3/uL — ABNORMAL HIGH (ref 0.1–1.0)
Monocytes Relative: 11 %
Neutro Abs: 6.4 10*3/uL (ref 1.7–7.7)
Neutrophils Relative %: 56 %
Platelets: 227 10*3/uL (ref 150–400)
RBC: 4.02 MIL/uL — ABNORMAL LOW (ref 4.22–5.81)
RDW: 16.9 % — ABNORMAL HIGH (ref 11.5–15.5)
WBC: 11.6 10*3/uL — ABNORMAL HIGH (ref 4.0–10.5)
nRBC: 0 % (ref 0.0–0.2)

## 2021-02-04 LAB — COMPREHENSIVE METABOLIC PANEL
ALT: 80 U/L — ABNORMAL HIGH (ref 0–44)
AST: 78 U/L — ABNORMAL HIGH (ref 15–41)
Albumin: 3 g/dL — ABNORMAL LOW (ref 3.5–5.0)
Alkaline Phosphatase: 188 U/L — ABNORMAL HIGH (ref 38–126)
Anion gap: 8 (ref 5–15)
BUN: 54 mg/dL — ABNORMAL HIGH (ref 8–23)
CO2: 31 mmol/L (ref 22–32)
Calcium: 10.1 mg/dL (ref 8.9–10.3)
Chloride: 118 mmol/L — ABNORMAL HIGH (ref 98–111)
Creatinine, Ser: 1.3 mg/dL — ABNORMAL HIGH (ref 0.61–1.24)
GFR, Estimated: 60 mL/min — ABNORMAL LOW (ref >60)
Glucose, Bld: 177 mg/dL — ABNORMAL HIGH (ref 70–99)
Potassium: 4 mmol/L (ref 3.5–5.1)
Sodium: 157 mmol/L — ABNORMAL HIGH (ref 135–145)
Total Bilirubin: 1 mg/dL (ref 0.3–1.2)
Total Protein: 7.8 g/dL (ref 6.5–8.1)

## 2021-02-04 LAB — APTT: aPTT: 39 seconds — ABNORMAL HIGH (ref 24–36)

## 2021-02-04 LAB — RESP PANEL BY RT-PCR (FLU A&B, COVID) ARPGX2
Influenza A by PCR: NEGATIVE
Influenza B by PCR: NEGATIVE
SARS Coronavirus 2 by RT PCR: NEGATIVE

## 2021-02-04 LAB — URINALYSIS, COMPLETE (UACMP) WITH MICROSCOPIC
Bilirubin Urine: NEGATIVE
Glucose, UA: NEGATIVE mg/dL
Hgb urine dipstick: NEGATIVE
Ketones, ur: NEGATIVE mg/dL
Nitrite: NEGATIVE
Protein, ur: 30 mg/dL — AB
Specific Gravity, Urine: 1.014 (ref 1.005–1.030)
WBC, UA: >50 WBC/hpf — ABNORMAL HIGH (ref 0–5)
pH: 6 (ref 5.0–8.0)

## 2021-02-04 LAB — PROTIME-INR
INR: 1.6 — ABNORMAL HIGH (ref 0.8–1.2)
Prothrombin Time: 19 seconds — ABNORMAL HIGH (ref 11.4–15.2)

## 2021-02-04 LAB — LACTIC ACID, PLASMA
Lactic Acid, Venous: 2.1 mmol/L (ref 0.5–1.9)
Lactic Acid, Venous: 1.5 mmol/L (ref 0.5–1.9)

## 2021-02-04 LAB — VANCOMYCIN, TROUGH: Vancomycin Tr: 19 ug/mL (ref 15–20)

## 2021-02-04 LAB — PROCALCITONIN: Procalcitonin: <0.1 ng/mL

## 2021-02-04 IMAGING — US US EXTREM  UP VENOUS*R*
1 series · 13 of 24 positions shown · non-contrast
Comparison: [DATE]

CLINICAL DATA: 69-year-old male with history of swelling in the
right upper extremity.

EXAM:
UPPER EXTREMITY VENOUS DOPPLER ULTRASOUND
TECHNIQUE: Gray-scale sonography with graded compression, as well as color
Doppler and duplex ultrasound were performed to evaluate the upper
extremity deep venous system from the level of the subclavian vein
and including the jugular, axillary, basilic, radial, ulnar and
upper cephalic vein. Spectral Doppler was utilized to evaluate flow
at rest and with distal augmentation maneuvers.

[Series 1: us venous img upper uni right (dvt) · portal-venous · 13 of 25 slices shown]
[im 1/25]
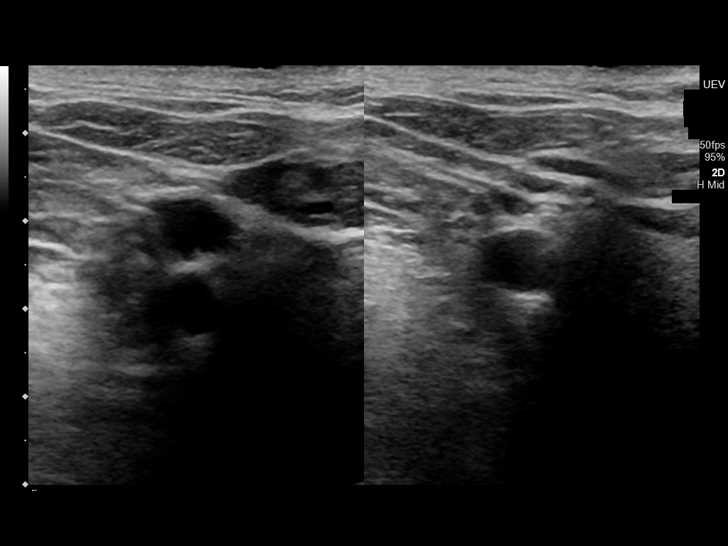
[im 3/25]
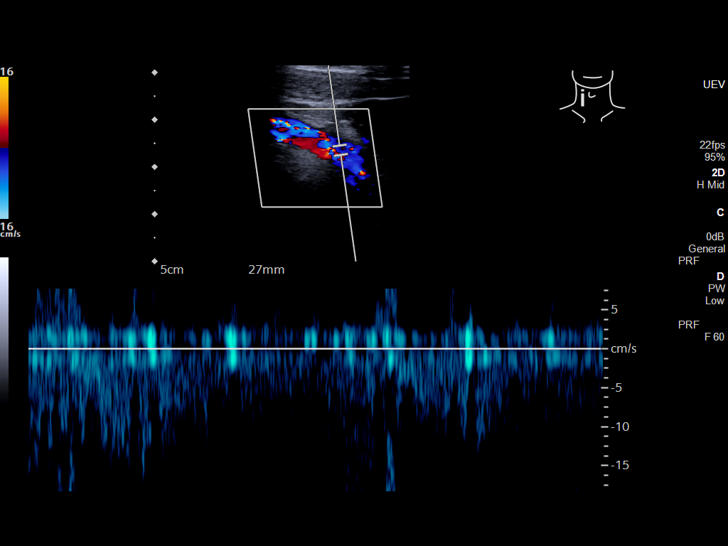
[im 5/25]
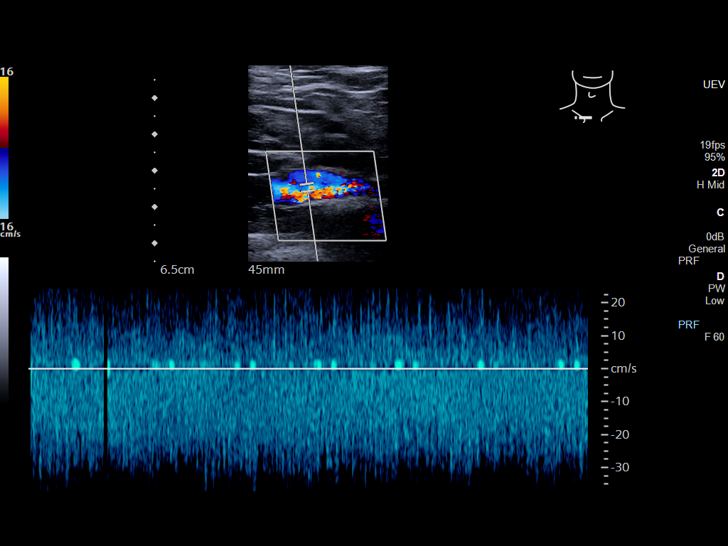
[im 7/25]
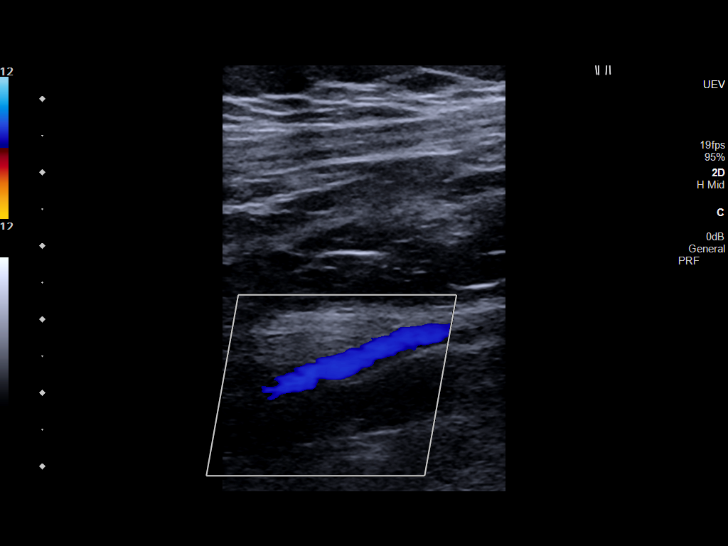
[im 9/25]
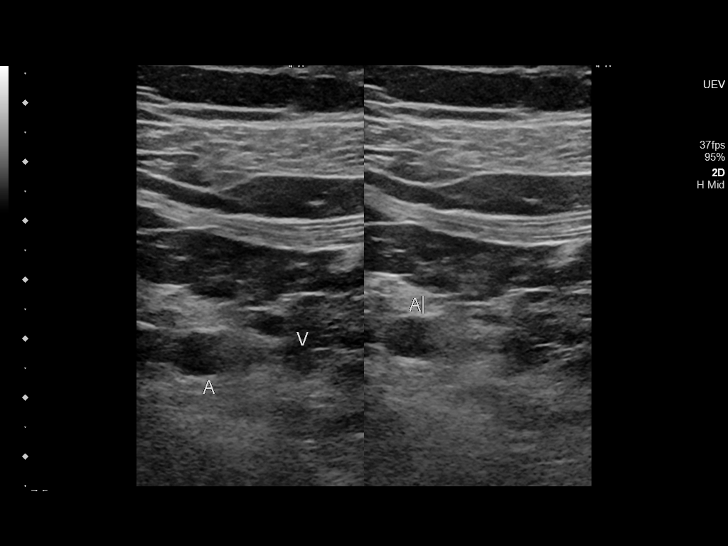
[im 11/25]
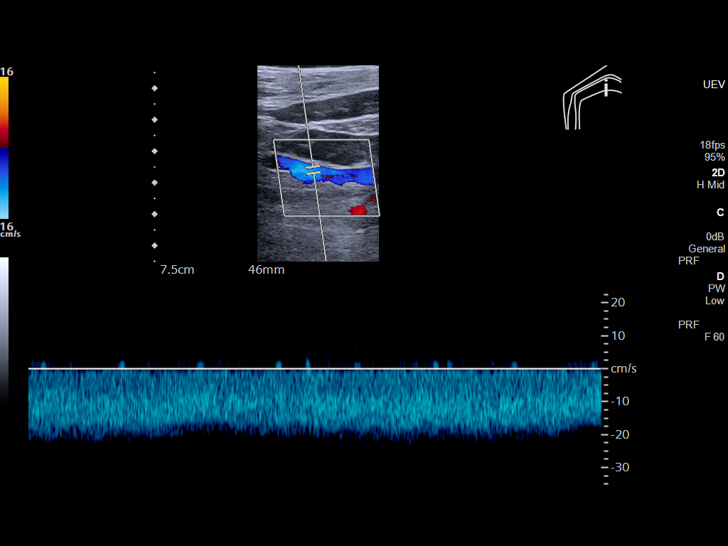
[im 13/25]
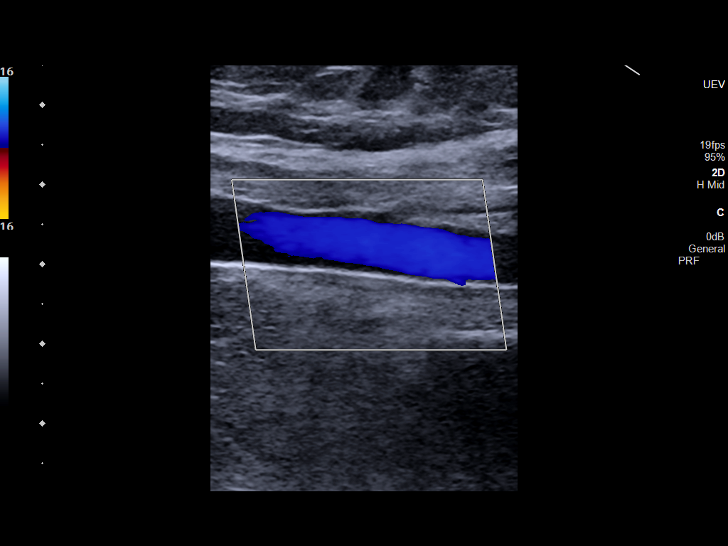
[im 14/25]
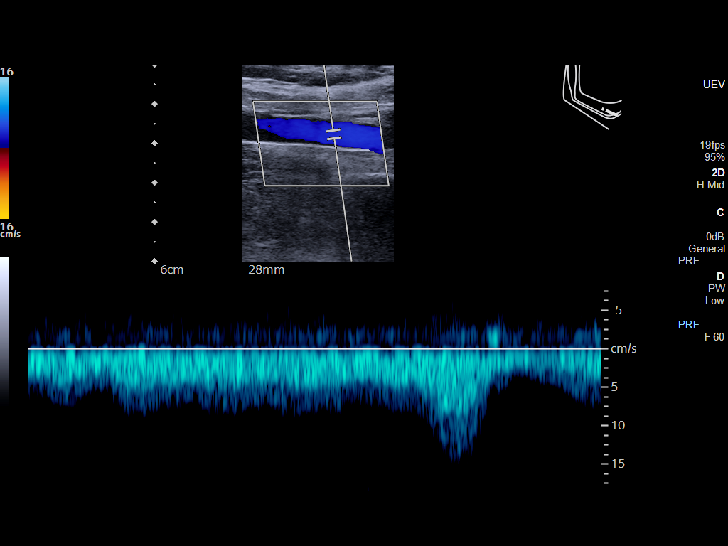
[im 16/25]
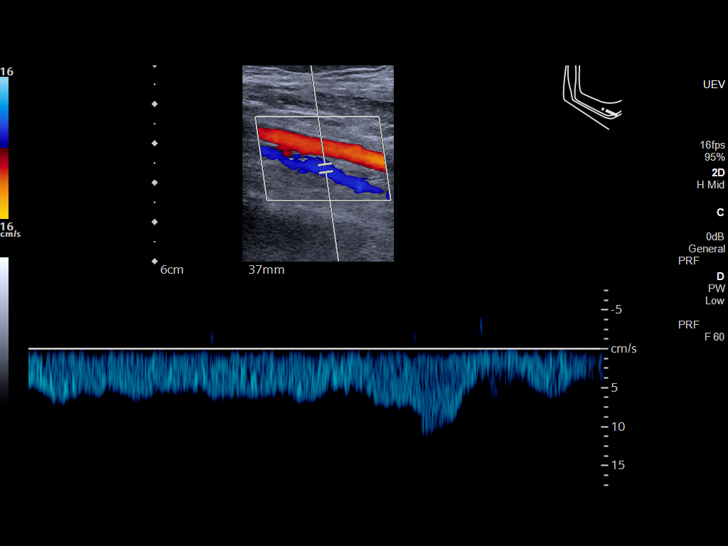
[im 18/25]
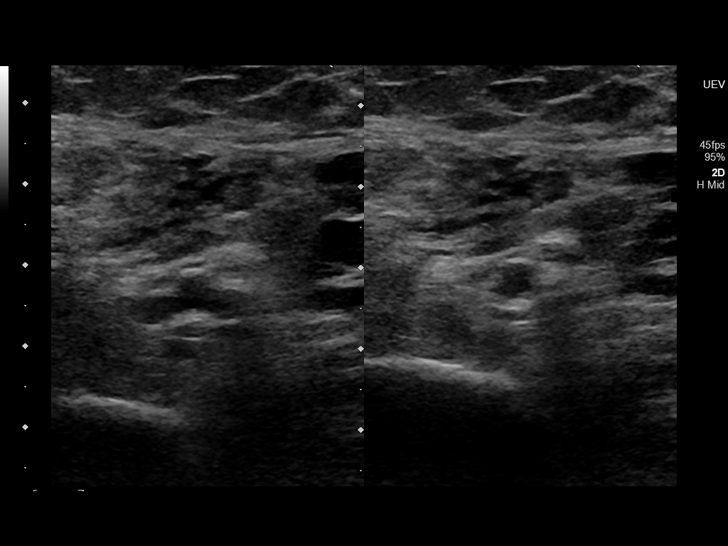
[im 20/25]
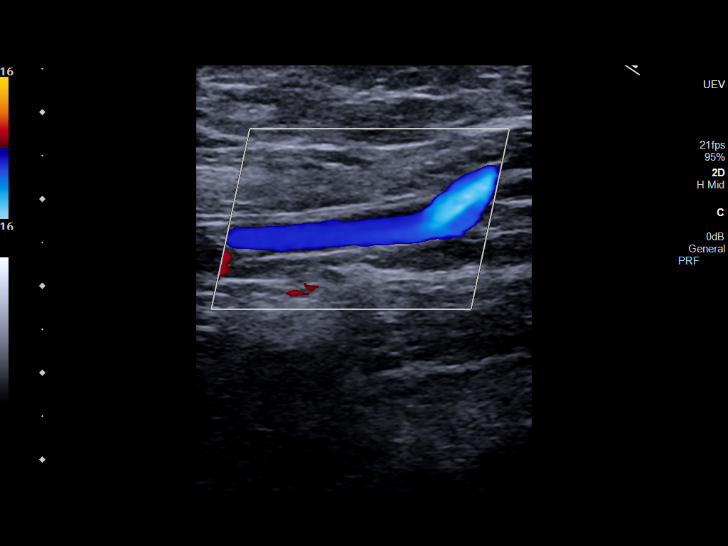
[im 22/25]
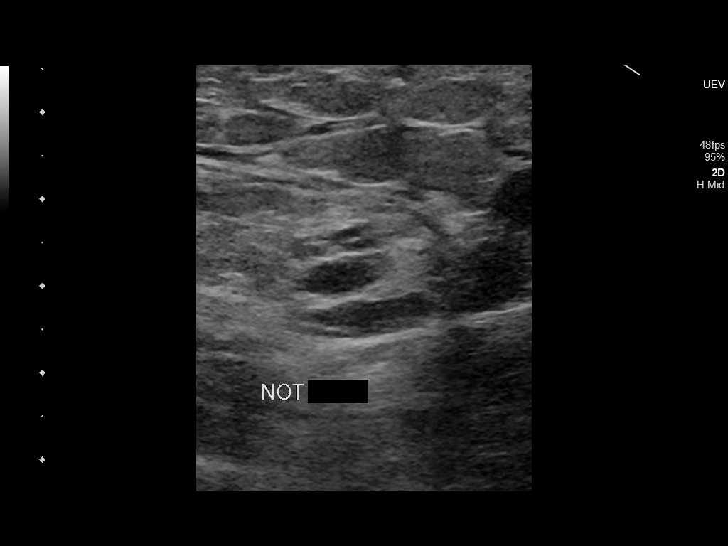
[im 25/25]
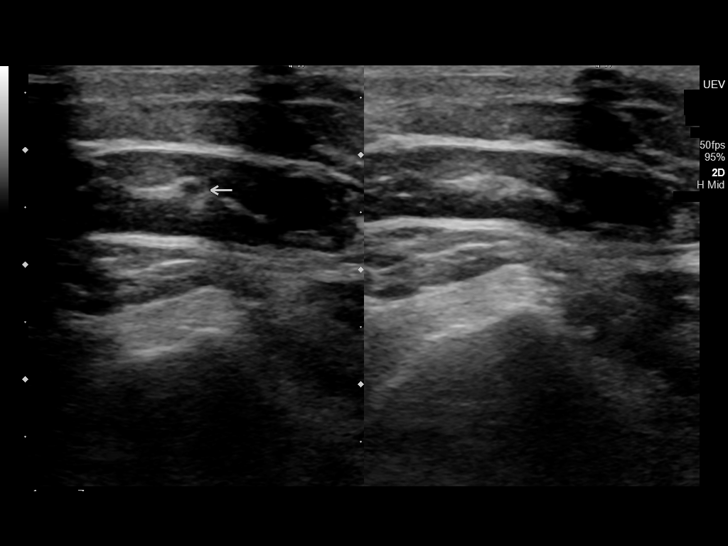

[13 of 24 positions shown; findings below may reference images not displayed]

FINDINGS: Contralateral Subclavian Vein: Respiratory phasicity is normal and
symmetric with the symptomatic side. No evidence of thrombus. Normal
compressibility.

Internal Jugular Vein: No evidence of thrombus. Normal
compressibility, respiratory phasicity and response to augmentation.

Subclavian Vein: No evidence of thrombus. Normal compressibility,
respiratory phasicity and response to augmentation.

Axillary Vein: No evidence of thrombus. Normal compressibility,
respiratory phasicity and response to augmentation.

Cephalic Vein: No evidence of thrombus. Normal compressibility,
respiratory phasicity and response to augmentation.

Basilic Vein: No evidence of thrombus. Normal compressibility,
respiratory phasicity and response to augmentation.

Brachial Veins: No evidence of thrombus. Normal compressibility,
respiratory phasicity and response to augmentation.

Radial Veins: No evidence of thrombus. Normal compressibility,
respiratory phasicity and response to augmentation.

Ulnar Veins: No evidence of thrombus. Normal compressibility,
respiratory phasicity and response to augmentation.

Other Findings:  None visualized.
IMPRESSION: No evidence of deep vein thrombosis in the right upper extremity.

## 2021-02-04 IMAGING — DX DG CHEST 1V PORT
1 series · 1 of 1 positions shown · non-contrast
Comparison: [DATE]

CLINICAL DATA: Tracheostomy in place.  Concern for infection

EXAM:
PORTABLE CHEST 1 VIEW

[chest ap]
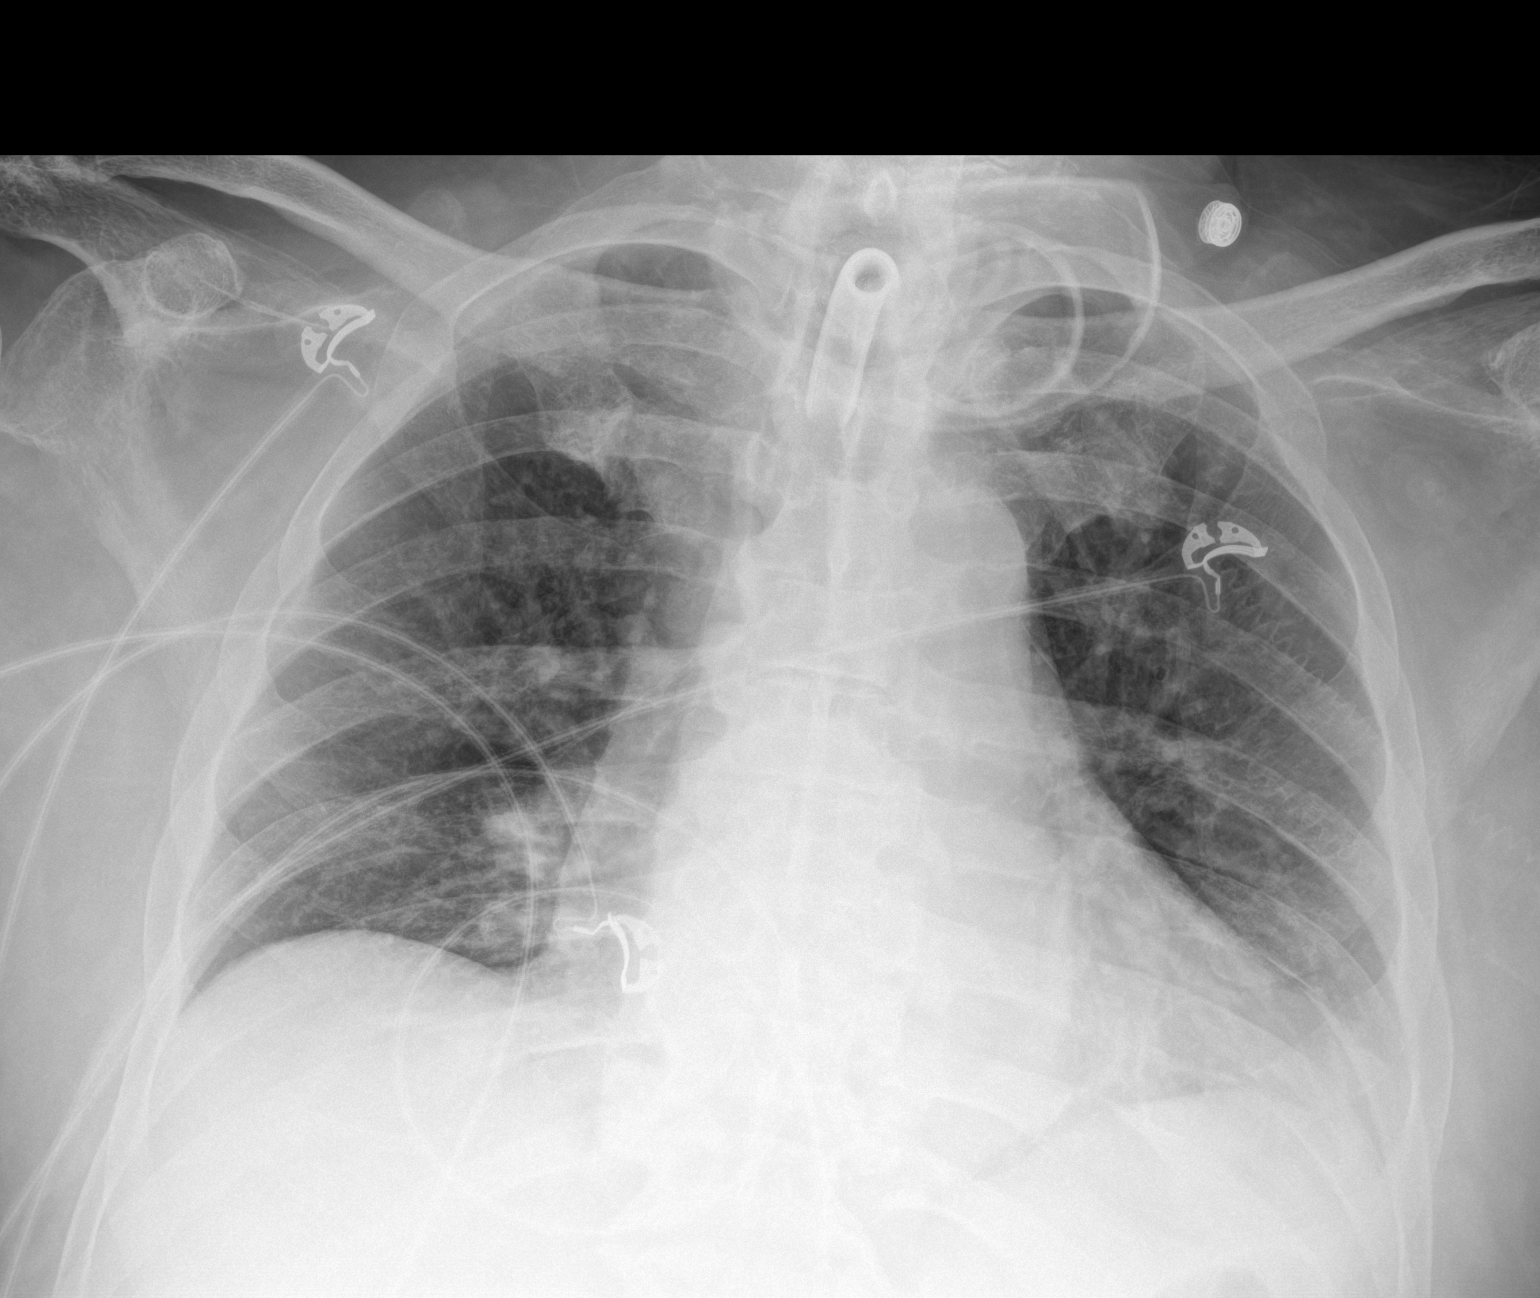

[1 of 1 positions shown; findings below may reference images not displayed]

FINDINGS: Previously noted peripherally inserted central catheter is not
appreciable on this examination. Tracheostomy catheter tip is 6.0 cm
above the carina. No pneumothorax. There is atelectatic change in
the left base. Lungs elsewhere clear. Heart is upper normal in size
with pulmonary vascularity normal. No adenopathy. Postoperative
change noted in visualized cervical spine.
IMPRESSION: Previously noted central catheter not appreciable. Tracheostomy as
described without pneumothorax. Atelectasis left base. Developing
pneumonia superimposed on atelectasis in this area in the left base
is questioned. Lungs elsewhere clear. Stable cardiac silhouette.

## 2021-02-04 MED ORDER — ONDANSETRON HCL 4 MG/2ML IJ SOLN: 4.0000 mg | Freq: Three times a day (TID) | INTRAMUSCULAR | Status: DC | PRN

## 2021-02-04 MED ORDER — FAMOTIDINE 20 MG PO TABS
20.0000 mg | ORAL_TABLET | Freq: Every day | ORAL | Status: DC
  Administered 2021-02-04 – 2021-02-12 (×9): 20 mg
  Filled 2021-02-04 (×9): qty 1

## 2021-02-04 MED ORDER — ERTAPENEM IV (FOR PTA / DISCHARGE USE ONLY): 1.0000 g | INTRAVENOUS | Status: DC

## 2021-02-04 MED ORDER — ONDANSETRON HCL 4 MG PO TABS: 4.0000 mg | ORAL_TABLET | Freq: Four times a day (QID) | ORAL | Status: DC | PRN

## 2021-02-04 MED ORDER — JUVEN PO PACK
1.0000 | PACK | Freq: Two times a day (BID) | ORAL | Status: DC
  Administered 2021-02-05 – 2021-02-12 (×15): 1

## 2021-02-04 MED ORDER — PROSOURCE TF PO LIQD
45.0000 mL | Freq: Three times a day (TID) | ORAL | Status: DC
  Administered 2021-02-04 – 2021-02-12 (×22): 45 mL
  Filled 2021-02-04 (×25): qty 45

## 2021-02-04 MED ORDER — VANCOMYCIN HCL 2000 MG/400ML IV SOLN
2000.0000 mg | Freq: Once | INTRAVENOUS | Status: DC
  Filled 2021-02-04: qty 400

## 2021-02-04 MED ORDER — GUAIFENESIN 100 MG/5ML PO SOLN
20.0000 mL | Freq: Four times a day (QID) | ORAL | Status: DC
  Administered 2021-02-05 – 2021-02-12 (×26): 400 mg
  Filled 2021-02-04 (×36): qty 20

## 2021-02-04 MED ORDER — IPRATROPIUM-ALBUTEROL 0.5-2.5 (3) MG/3ML IN SOLN
3.0000 mL | Freq: Two times a day (BID) | RESPIRATORY_TRACT | Status: DC
  Administered 2021-02-04 – 2021-02-08 (×8): 3 mL via RESPIRATORY_TRACT
  Filled 2021-02-04 (×9): qty 3

## 2021-02-04 MED ORDER — FLUOXETINE HCL 20 MG/5ML PO SOLN
40.0000 mg | Freq: Every day | ORAL | Status: DC
  Administered 2021-02-04 – 2021-02-12 (×9): 40 mg
  Filled 2021-02-04 (×9): qty 10

## 2021-02-04 MED ORDER — APIXABAN 5 MG PO TABS
5.0000 mg | ORAL_TABLET | Freq: Two times a day (BID) | ORAL | Status: DC
  Administered 2021-02-04 – 2021-02-05 (×2): 5 mg via ORAL
  Filled 2021-02-04 (×2): qty 1

## 2021-02-04 MED ORDER — SODIUM CHLORIDE 0.9 % IV SOLN
1.0000 g | INTRAVENOUS | Status: DC
  Administered 2021-02-05 – 2021-02-12 (×8): 1000 mg via INTRAVENOUS
  Filled 2021-02-04 (×11): qty 1

## 2021-02-04 MED ORDER — ENOXAPARIN SODIUM 40 MG/0.4ML IJ SOSY: 40.0000 mg | PREFILLED_SYRINGE | INTRAMUSCULAR | Status: DC

## 2021-02-04 MED ORDER — VANCOMYCIN HCL 500 MG/100ML IV SOLN
500.0000 mg | Freq: Two times a day (BID) | INTRAVENOUS | Status: DC
  Administered 2021-02-04 – 2021-02-07 (×5): 500 mg via INTRAVENOUS
  Filled 2021-02-04 (×8): qty 100

## 2021-02-04 MED ORDER — VANCOMYCIN IV (FOR PTA / DISCHARGE USE ONLY): 500.0000 mg | Freq: Two times a day (BID) | INTRAVENOUS | Status: DC

## 2021-02-04 MED ORDER — ONDANSETRON HCL 4 MG PO TABS: 4.0000 mg | ORAL_TABLET | Freq: Three times a day (TID) | ORAL | Status: DC | PRN

## 2021-02-04 MED ORDER — SODIUM CHLORIDE 0.9 % IV SOLN: 2.0000 g | Freq: Once | INTRAVENOUS | Status: DC

## 2021-02-04 MED ORDER — ONDANSETRON HCL 4 MG/2ML IJ SOLN: 4.0000 mg | Freq: Four times a day (QID) | INTRAMUSCULAR | Status: DC | PRN

## 2021-02-04 MED ORDER — IPRATROPIUM-ALBUTEROL 0.5-2.5 (3) MG/3ML IN SOLN: 3.0000 mL | Freq: Two times a day (BID) | RESPIRATORY_TRACT | Status: DC

## 2021-02-04 MED ORDER — LACTATED RINGERS IV BOLUS
1000.0000 mL | Freq: Once | INTRAVENOUS | Status: AC
  Administered 2021-02-04: 1000 mL via INTRAVENOUS

## 2021-02-04 MED ORDER — SODIUM CHLORIDE 0.45 % IV SOLN: INTRAVENOUS | Status: AC

## 2021-02-04 MED ORDER — TRAZODONE HCL 50 MG PO TABS
50.0000 mg | ORAL_TABLET | Freq: Every day | ORAL | Status: DC
  Administered 2021-02-04 – 2021-02-11 (×8): 50 mg
  Filled 2021-02-04 (×8): qty 1

## 2021-02-04 MED ORDER — ACETAMINOPHEN 325 MG PO TABS: 650.0000 mg | ORAL_TABLET | Freq: Four times a day (QID) | ORAL | Status: DC | PRN

## 2021-02-04 MED ORDER — DOCUSATE SODIUM 100 MG PO CAPS: 100.0000 mg | ORAL_CAPSULE | Freq: Two times a day (BID) | ORAL | Status: DC | PRN

## 2021-02-04 MED ORDER — OSMOLITE 1.5 CAL PO LIQD
474.0000 mL | Freq: Four times a day (QID) | ORAL | Status: DC
  Administered 2021-02-04 – 2021-02-11 (×28): 474 mL
  Administered 2021-02-11: 237 mL
  Administered 2021-02-12: 474 mL

## 2021-02-04 MED ORDER — ACETAMINOPHEN 650 MG RE SUPP: 650.0000 mg | Freq: Four times a day (QID) | RECTAL | Status: DC | PRN

## 2021-02-04 MED ORDER — FREE WATER
400.0000 mL | Status: DC
  Administered 2021-02-04 – 2021-02-05 (×2): 400 mL

## 2021-02-04 MED ORDER — SCOPOLAMINE 1 MG/3DAYS TD PT72
1.0000 | MEDICATED_PATCH | TRANSDERMAL | Status: DC
  Administered 2021-02-06 – 2021-02-12 (×3): 1.5 mg via TRANSDERMAL
  Filled 2021-02-04 (×3): qty 1

## 2021-02-04 MED ORDER — OSMOLITE 1.5 CAL PO LIQD: 237.0000 mL | Freq: Four times a day (QID) | ORAL | Status: DC

## 2021-02-04 MED ORDER — BISACODYL 10 MG RE SUPP
10.0000 mg | Freq: Every day | RECTAL | Status: DC
  Administered 2021-02-06 – 2021-02-10 (×2): 10 mg via RECTAL
  Filled 2021-02-04 (×4): qty 1

## 2021-02-04 MED ORDER — GUAIFENESIN 200 MG PO TABS
400.0000 mg | ORAL_TABLET | Freq: Four times a day (QID) | ORAL | Status: DC
  Filled 2021-02-04 (×4): qty 2

## 2021-02-04 MED ORDER — ACETAMINOPHEN 160 MG/5ML PO SOLN
320.0000 mg | ORAL | Status: DC | PRN
  Administered 2021-02-07 – 2021-02-10 (×3): 640 mg
  Administered 2021-02-11: 320 mg
  Filled 2021-02-04 (×7): qty 20.3

## 2021-02-04 NOTE — Consult Note (Signed)
PHARMACY -  BRIEF ANTIBIOTIC NOTE   Pharmacy has received consult(s) for Vancomycin and Cefepime from an ED provider.  The patient's profile has been reviewed for ht/wt/allergies/indication/available labs.    One time order(s) placed for Vancomycin 2g IV and Cefepime 2g IV x 1 dose each.  Further antibiotics/pharmacy consults should be ordered by admitting physician if indicated.                       Thank you, Pearla Dubonnet 02/04/2021  2:36 PM

## 2021-02-04 NOTE — Progress Notes (Signed)
Pharmacy Antibiotic Note  Ryan Day is an 70 y.o. male with medical history significant for quadriplegia status post mechanical fall at home with C3-6 laminectomy, chronic tracheostomy, hypertension, hyperlipidemia.  He was admitted on 12/07/2020 when he presented to [facility] emergency room with hematuria. Patient apparently had a Foley catheter placed 2 weeks prior to the presentation and completed treatment with 7-day course of antibiotic therapy for the UTI. He has a sacral wound and sacral wound culture showed Pseudomonas, rare MRSA, few bacteria voids, rare Candida albicans.  Infectious disease was consulted. Patient was on treatment with IV vancomycin, cefepime. He had MRI of his lumbar spine done on 01/08/2021 which per report showed findings concerning for osteomyelitis. He was on treatment with IV vancomycin, ciprofloxacin. Was to be on antibiotics with tentative end date 02/17/21 for osteomyelitis total of 6 weeks. Per daughter, pt is currently on vancomycin 500mg  IV q12h and received a dose 5/15 0900 and is also on ertapenem 1g IV q24h and received a dose 5/15 as well. Pharmacy has been consulted for vancomycin dosing.   ID to see in AM. Pt has sacral culture from 12/11/2020 with MRSA and Pseudomonas (resistant to cefepime and ceftazidime; sensitive to cipro and imipenem). Pt also has multiple trach aspirate/respiratory cultures with pseudomonas.    Plan:  Will continue patient current regimen of vancomycin 500mg  IV q12h  Monitor renal function and adjust dose as clinically indicated  Last vanc trough was 15 on 5/8; unclear if pt has had any other levels drawn, will order vanc trough for tonight prior to 2100 dose.   Weight: 110 kg (242 lb 8.1 oz)  Temp (24hrs), Avg:98.4 F (36.9 C), Min:98.4 F (36.9 C), Max:98.4 F (36.9 C)  Recent Labs  Lab 01/28/21 2320 01/29/21 0653 02/04/21 1450 02/04/21 1521  WBC  --  6.3 11.6*  --   CREATININE  --  0.88 1.30*  --   LATICACIDVEN  --   --    --  2.1*  VANCOTROUGH 15  --   --   --     Estimated Creatinine Clearance: 75 mL/min (A) (by C-G formula based on SCr of 1.3 mg/dL (H)).    Allergies  Allergen Reactions  . Shrimp (Diagnostic)     Experiences facial droop when eating seafood    Antimicrobials this admission: 5/15 Ertapenem 5/15 vancomyin >>  Dose adjustments this admission:   Microbiology results: 5/15 BCx: sent 5/15 UCx: sent 5/15 MRSA PCR: sent  Thank you for allowing pharmacy to be a part of this patient's care.  Sherilyn Banker, PharmD Pharmacy Resident  02/04/2021 6:06 PM

## 2021-02-04 NOTE — H&P (Incomplete)
History and Physical   Ryan Day ZOX:[id number] DOB: 08/21/1951 DOA: 02/04/2021  PCP: Valerie Roys, DO  Outpatient Specialists: Dr. Yaakov Guthrie, infectious disease Patient coming from: Home  I have personally briefly reviewed patient's old medical records in [place].  Chief Concern: PICC line drainage  HPI: Ryan Day is a 70 y.o. 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  Per daughter at bedside this has been going on for 2 days.  She reports that the drainage is green and purulent.  She also endorses swelling that started on 02/03/2021, prompting him to present to emergency department today.  At bedside, patient was able to tell me his name, his age and he knows he is in the hospital.  He verbalizes his daughter at bedside.  He does not have any complaints he states that he is feeling just fine.  Social history: Patient lives at home with his daughter.  He denies tobacco, EtOH, recreational drug use.  Vaccination: Patient is vaccinated for COVID-19 with 2 doses  ROS: Constitutional: no weight change, no fever ENT/Mouth: no sore throat, no rhinorrhea Eyes: no eye pain, no vision changes Cardiovascular: no chest pain, no dyspnea,  no edema, no palpitations Respiratory: no cough, no sputum, no wheezing Gastrointestinal: no nausea, no vomiting, no diarrhea, no constipation Genitourinary: no urinary incontinence, no dysuria, no hematuria Musculoskeletal: no arthralgias, no myalgias Skin: no skin lesions, no pruritus, Neuro: + weakness, no loss of consciousness, no syncope Psych: no anxiety, no depression, + decrease  appetite Heme/Lymph: no bruising, no bleeding  ED Course: Discussed with ED provider, patient requiring hospitalization for PICC line drainage.  Vitals in the emergency department was remarkable for temperature of 98.4, respiration rate of 23, heart rate 96, blood pressure 113/68, SPO2 100% on room air.  Labs in the emergency department was remarkable for sodium level 157, chloride 118, BUN 54, potassium 4.0, bicarb 31, serum creatinine 1.30, nonfasting blood glucose 177, WBC 11.6, hemoglobin 11.5, platelets 227.  Alk phos was 188, albumin 3.0, AST 78, ALT 80.  Lactic acid 2.0, Pro-Cal less than 0.10.  Assessment/Plan  Principal Problem:   AKI (acute kidney injury) ([place]) Active Problems:   Hypertension   Elevated alkaline phosphatase level   Cervical spinal cord injury, sequela (HCC)   Spinal cord injury, cervical region, sequela (HCC)   Pressure injury of skin   S/P percutaneous endoscopic gastrostomy (PEG) tube placement (HCC)   Tracheostomy dependent (HCC)   Quadriplegia (HCC)   Neurogenic orthostatic hypotension (HCC)   Neurogenic bowel   Decubitus ulcer of sacral region, stage 4 ([place])   Acute kidney injury Dehydration - Patient's daughter at bedside endorses that patient gets free water 200 mL every 2 hours flushes while awake at home - I increased free water flushes to 400 mL every 2 hours PICC line drainage- PICC line team has been ordered *** Chart reviewed.   DVT prophylaxis: ***  Code Status: Full code, patient states he has not made up his mind yet Diet: *** Family Communication: ***  Disposition Plan: ***  Consults called: ***  Admission status: ***   Past Medical History:  Diagnosis Date  . Acute on chronic respiratory failure with hypoxia ([place])   . Acute renal injury due to hypovolemia (  HCC)   . Autonomic instability   . Cardiac arrest ([place])   . Cervical spinal cord injury, sequela ([place])   . Elevated alkaline phosphatase level   . 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 Hyperlipidemia   . Hypertension   . Paralysis ([place])   . PEG (percutaneous endoscopic gastrostomy) status ([place])   . Tracheostomy dependence ([place])   . Urinary catheter in place     Past Surgical History:  Procedure Laterality Date  . APPLICATION OF WOUND VAC N/A 08/16/2020   Procedure: APPLICATION OF WOUND VAC;  Surgeon: Fredirick Maudlin, MD;  Location: [facility] ORS;  Service: General;  Laterality: N/A;  . COLONOSCOPY WITH PROPOFOL N/A 12/05/2017   Procedure: COLONOSCOPY WITH PROPOFOL;  Surgeon: Lin Landsman, MD;  Location: [facility] ENDOSCOPY;  Service: Gastroenterology;  Laterality: N/A;  . ESOPHAGOGASTRODUODENOSCOPY  12/05/2017   Procedure: ESOPHAGOGASTRODUODENOSCOPY (EGD);  Surgeon: Lin Landsman, MD;  Location: [facility];  Service: Gastroenterology;;  . INCISION AND DRAINAGE ABSCESS N/A 08/16/2020   Procedure: INCISION AND DRAINAGE ABSCESS, SACRAL;  Surgeon: Fredirick Maudlin, MD;  Location: [facility] ORS;  Service: General;  Laterality: N/A;    Social History:  reports that he has never smoked. He has never used smokeless tobacco. He reports previous alcohol use. He reports that he does not use drugs.  Allergies  Allergen Reactions  . Shrimp (Diagnostic)     Experiences facial droop when eating seafood   Family History  Problem Relation Age of Onset  . Cancer Father        lung  . Cancer Brother        throat  . Heart disease Brother 90  . Liver cancer Brother   . Alcohol abuse Brother    Family history: Family history reviewed and not pertinent***  Prior to Admission medications   Medication Sig Start Date End Date Taking? Authorizing Provider  baclofen (LIORESAL) 10 MG tablet Place 1 tablet (10 mg total) into feeding tube 4 (four) times daily. 01/05/21  Yes Lorella Nimrod, MD  ELIQUIS 5 MG TABS tablet Take 1 tablet by mouth 2 (two) times daily. 02/01/21  Yes [provider]  ertapenem (INVANZ) IVPB Inject 1 g into the vein.   Yes  [provider]  famotidine (PEPCID) 20 MG tablet Place 1 tablet (20 mg total) into feeding tube daily. 07/20/20  Yes Love, Ivan Anchors, PA-C  ferrous sulfate 300 (60 Fe) MG/5ML syrup Place 1.3 mLs (78 mg total) into feeding tube daily. 07/20/20  Yes Love, Ivan Anchors, PA-C  FLUoxetine (PROZAC) 20 MG/5ML solution Place 10 mLs (40 mg total) into feeding tube daily. 11/22/20  Yes Vigg, Avanti, MD  FLUoxetine (PROZAC) 40 MG capsule Take 40 mg by mouth daily. 02/01/21  Yes [provider]  guaiFENesin 200 MG tablet Place 2 tablets (400 mg total) into feeding tube every 6 (six) hours. 07/20/20  Yes Love, Ivan Anchors, PA-C  ipratropium-albuterol (DUONEB) 0.5-2.5 (3) MG/3ML SOLN Take 3 mLs by nebulization 2 (two) times daily. 01/05/21  Yes Lorella Nimrod, MD  polyvinyl alcohol (LIQUIFILM TEARS) 1.4 % ophthalmic solution Place 1 drop into both eyes as needed for dry eyes. 01/05/21  Yes Lorella Nimrod, MD  scopolamine (TRANSDERM-SCOP) 1 MG/3DAYS Place 1 patch onto the skin every 3 (three) days.   Yes [provider]  sennosides (SENOKOT) 8.8 MG/5ML syrup Place 5 mLs into feeding tube daily at 6 (six) AM. Patient taking differently: Place 5 mLs into feeding tube daily. 07/20/20  Yes Love, Ivan Anchors, PA-C  simethicone (MYLICON) 40 YQ/6.5HQ drops Place 1.2 mLs (80 mg total) into feeding tube 4 (four) times daily. Patient taking differently: Place 80 mg into feeding tube 4 (four) times daily -  before meals and at bedtime. 07/20/20  Yes Love, Ivan Anchors, PA-C  traZODone (DESYREL) 50 MG tablet Place 1 tablet (50 mg total) into feeding tube at bedtime. 10/23/20  Yes Jon Billings, NP  vancomycin IVPB Inject 500 mg into the vein. 125 ml / hour   Yes [provider]  acetaminophen (TYLENOL) 160 MG/5ML solution Place 10-20 mLs (320-640 mg total) into feeding tube every 4 (four) hours as needed for mild pain. 01/05/21   Lorella Nimrod, MD  ascorbic acid (VITAMIN C) 500 MG tablet Place 1 tablet (500 mg  total) into feeding tube 2 (two) times daily. Patient not taking: No sig reported 07/18/20   Love, Ivan Anchors, PA-C  bisacodyl (DULCOLAX) 10 MG suppository Place 1 suppository (10 mg total) rectally at bedtime. 07/20/20   Love, Ivan Anchors, PA-C  diclofenac Sodium (VOLTAREN) 1 % GEL Apply 4 g topically 4 (four) times daily as needed (pain). 01/05/21   Lorella Nimrod, MD  docusate sodium (COLACE) 100 MG capsule Take 1 capsule (100 mg total) by mouth 2 (two) times daily as needed for mild constipation. 01/05/21   Lorella Nimrod, MD  insulin aspart (NOVOLOG) 100 UNIT/ML injection Inject 0-15 Units into the skin every 4 (four) hours. Patient not taking: No sig reported 01/05/21   Lorella Nimrod, MD  lidocaine (LIDODERM) 5 % Place 1 patch onto the skin daily. Remove & Discard patch within 12 hours or as directed by MD 07/20/20   Love, Ivan Anchors, PA-C  midodrine (PROAMATINE) 5 MG tablet Place 1 tablet (5 mg total) into feeding tube 2 (two) times daily with a meal. Patient not taking: No sig reported 01/05/21   Lorella Nimrod, MD  modafinil (PROVIGIL) 100 MG tablet Take 1 tablet (100 mg total) by mouth daily. Patient not taking: No sig reported 01/06/21   Lorella Nimrod, MD  nutrition supplement, JUVEN, (JUVEN) PACK Place 1 packet into feeding tube 2 (two) times daily between meals. 01/05/21   Lorella Nimrod, MD  Nutritional Supplements (FEEDING SUPPLEMENT, OSMOLITE 1.5 CAL,) LIQD Place 237 mLs into feeding tube 4 (four) times daily. 01/05/21   Lorella Nimrod, MD  Nutritional Supplements (FEEDING SUPPLEMENT, OSMOLITE 1.5 CAL,) LIQD Place 474 mLs into feeding tube 4 (four) times daily. 01/06/21   Lorella Nimrod, MD  Nutritional Supplements (FEEDING SUPPLEMENT, PROSOURCE TF,) liquid Place 45 mLs into feeding tube 3 (three) times daily. 01/05/21   Lorella Nimrod, MD  ondansetron (ZOFRAN) 4 MG tablet Place 1 tablet (4 mg total) into feeding tube every 8 (eight) hours as needed for nausea, vomiting or refractory nausea / vomiting.  07/20/20   Love, Ivan Anchors, PA-C  polycarbophil (FIBERCON) 625 MG tablet Place 1 tablet (625 mg total) into feeding tube daily. Patient not taking: No sig reported 07/20/20   Love, Ivan Anchors, PA-C  rivaroxaban (XARELTO) 10 MG TABS tablet Take one tablet per tube daily Patient not taking: No sig reported 08/08/20   [name], MD  saccharomyces boulardii (FLORASTOR) 250 MG capsule Place 1 capsule (250 mg total) into feeding tube 2 (two) times daily. Patient not taking: No sig reported 07/20/20   Love, Ivan Anchors, PA-C  sodium chloride flush (NS) 0.9 % SOLN 10-40 mLs by Intracatheter route as needed (flush). 01/05/21   Lorella Nimrod, MD  Water For Irrigation, Sterile (FREE WATER) SOLN Place 400 mLs into feeding tube every 4 (four) hours. Patient taking differently: Place 300 mLs into feeding tube every 4 (four) hours. 07/20/20   Love, Ivan Anchors, PA-C  zinc sulfate 220 (50 Zn) MG capsule Place 1 capsule (220 mg total) into feeding tube daily. Patient not taking: No sig reported 07/20/20   Flora Lipps    Physical Exam: Vitals:   02/04/21 1533 02/04/21 1555 02/04/21 1700 02/04/21 1807  BP: (!) 84/53 104/74 113/68 (!) 117/56  Pulse: 96 100 93 93  Resp: (!) 29 19 (!) 21 18  Temp:      TempSrc:      SpO2: 98% 100% 100% 100%  Weight:        Constitutional: appears ***, NAD, calm, comfortable Eyes: PERRL, lids and conjunctivae normal ENMT: Mucous membranes are moist. Posterior pharynx clear of any exudate or lesions. Age-appropriate dentition. Hearing appropriate/loss*** Neck: normal, supple, no masses, no thyromegaly Respiratory: clear to auscultation bilaterally, no wheezing, no crackles. Normal respiratory effort. No accessory muscle use.  Cardiovascular: Regular rate and rhythm, no murmurs / rubs / gallops. No extremity edema. 2+ pedal pulses. No carotid bruits.  Abdomen: no tenderness, no masses palpated, no hepatosplenomegaly. Bowel sounds positive.  Musculoskeletal: no clubbing /  cyanosis. No joint deformity upper and lower extremities. Good ROM, no contractures, no atrophy. Normal muscle tone.  Skin: no rashes, lesions, ulcers. No induration Neurologic: Sensation intact. Strength 5/5 in all 4.  Psychiatric: Normal judgment and insight. Alert and oriented x 3. Normal mood.   EKG: independently reviewed, showing ***  Chest x-ray on Admission: I personally reviewed and I agree*** with radiologist reading as below.  DG Chest Port 1 View  Result Date: 02/04/2021 CLINICAL DATA:  Tracheostomy in place.  Concern for infection EXAM: PORTABLE CHEST 1 VIEW COMPARISON:  Jan 21, 2021 FINDINGS: Previously noted peripherally inserted central catheter is not appreciable on this examination. Tracheostomy catheter tip is 6.0 cm above the carina. No pneumothorax. There is atelectatic change in the left base. Lungs elsewhere clear. Heart is upper normal in size with pulmonary vascularity normal. No adenopathy. Postoperative change noted in visualized cervical spine. IMPRESSION: Previously noted central catheter not appreciable. Tracheostomy as described without pneumothorax. Atelectasis left base. Developing pneumonia superimposed on atelectasis in this area in the left base is questioned. Lungs elsewhere clear. Stable cardiac silhouette. Electronically Signed   By: Lowella Grip III M.D.   On: 02/04/2021 16:52    Labs on Admission: I have personally reviewed following labs  CBC: Recent Labs  Lab 01/29/21 0653 02/04/21 1450  WBC 6.3 11.6*  NEUTROABS  --  6.4  HGB 10.2* 11.5*  HCT 31.5* 37.3*  MCV 89.5 92.8  PLT 189 841   Basic Metabolic Panel: Recent Labs  Lab 01/29/21 0653 02/04/21 1450  NA 142 157*  K 3.7 4.0  CL 104 118*  CO2 31 31  GLUCOSE 106* 177*  BUN 33* 54*  CREATININE 0.88 1.30*  CALCIUM 10.5* 10.1  MG 2.2  --   PHOS 3.1  --    GFR: Estimated Creatinine Clearance: 75 mL/min (A) (by C-G formula based on SCr of 1.3 mg/dL (H)). Liver Function  Tests: Recent Labs  Lab 02/04/21 1450  AST 78*  ALT 80*  ALKPHOS 188*  BILITOT 1.0  PROT 7.8  ALBUMIN 3.0*   No results for input(s): LIPASE, AMYLASE in the last 168 hours. No results for input(s): AMMONIA in the last  168 hours. Coagulation Profile: Recent Labs  Lab 02/04/21 1521  INR 1.6*   Cardiac Enzymes: No results for input(s): CKTOTAL, CKMB, CKMBINDEX, TROPONINI in the last 168 hours. BNP (last 3 results) No results for input(s): PROBNP in the last 8760 hours. HbA1C: No results for input(s): HGBA1C in the last 72 hours. CBG: No results for input(s): GLUCAP in the last 168 hours. Lipid Profile: No results for input(s): CHOL, HDL, LDLCALC, TRIG, CHOLHDL, LDLDIRECT in the last 72 hours. Thyroid Function Tests: No results for input(s): TSH, T4TOTAL, FREET4, T3FREE, THYROIDAB in the last 72 hours. Anemia Panel: No results for input(s): VITAMINB12, FOLATE, FERRITIN, TIBC, IRON, RETICCTPCT in the last 72 hours. Urine analysis:    Component Value Date/Time   COLORURINE YELLOW (A) 02/04/2021 1521   APPEARANCEUR CLOUDY (A) 02/04/2021 1521   APPEARANCEUR Cloudy (A) 12/07/2020 1055   LABSPEC 1.014 02/04/2021 1521   PHURINE 6.0 02/04/2021 1521   GLUCOSEU NEGATIVE 02/04/2021 1521   HGBUR NEGATIVE 02/04/2021 [place] 02/04/2021 1521   BILIRUBINUR Negative 12/07/2020 [place] 02/04/2021 1521   PROTEINUR 30 (A) 02/04/2021 1521   NITRITE NEGATIVE 02/04/2021 1521   LEUKOCYTESUR LARGE (A) 02/04/2021 1521    Tramaine Snell N Latorya Bautch D.O. [facility]  If 7PM-7AM, please contact overnight-coverage provider If 7AM-7PM, please contact day coverage provider [email or web address]  02/04/2021, 7:18 PM

## 2021-02-04 NOTE — ED Notes (Signed)
PIV placement attempted x1, unsuccessful. Provider, Tamala Julian, notified. Will attempt ultrasound guided IV placement.
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
## 2021-02-04 NOTE — ED Provider Notes (Addendum)
[facility] Emergency Department Provider Note  ____________________________________________   Event Date/Time   First MD Initiated Contact with Patient 02/04/21 1346     (approximate)  I have reviewed the triage vital signs and the nursing notes.   HISTORY  Chief Complaint Vascular Access Problem   HPI Ryan Day is a 70 y.o. male with medical history significant for quadriplegia status post mechanical fall at home with C3-6 laminectomy, chronic tracheostomy, hypertension, hyperlipidemia and recently hospitalized 3/17-4/15 for respiratory failure and septic shock.  Pneumonia, sacral osteomyelitis cystitis discharged with right upper extremity PICC line on daily Vanco and cefepime currently receiving care at home with home nursing and enrolled in hospice in the last week who presents with concerns that his PICC line has become possibly infected as family has noticed some purulence around it.  History is very limited from the patient secondary to significant secretions in his trach although he denies any pain or other symptoms.  He has not had any vomiting or any other concerns.  No other history of similar available patient arrival.          Past Medical History:  Diagnosis Date  . Acute on chronic respiratory failure with hypoxia ([place])   . Acute renal injury due to hypovolemia ([place])   . Autonomic instability   . Cardiac arrest ([place])   . Cervical spinal cord injury, sequela ([place])   . Elevated alkaline phosphatase level   . 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 Hyperlipidemia   . Hypertension   . Paralysis ([place])   . PEG (percutaneous endoscopic gastrostomy) status ([place])   . Tracheostomy dependence ([place])   . Urinary catheter in place     Patient Active Problem List   Diagnosis Date Noted  . AKI (acute kidney injury) ([place]) 02/04/2021  . Abdominal distension   . Chronic respiratory failure with hypoxia ([place])   . Swelling   . Septic  shock ([place]) 12/07/2020  . Decubitus ulcer of sacral region, stage 4 ([place])   . Hypokalemia 08/15/2020  . Hypoalbuminemia 08/15/2020  . Leukocytosis 08/15/2020  . Pneumonia 08/14/2020  . Goals of care, counseling/discussion   . Sacral decubitus ulcer, stage II ([place]) 07/25/2020  . Aspiration pneumonia ([place]) 07/25/2020  . Palliative care by specialist   . Salivary secretion   . Spasticity   . Spinal cord injury, cervical region, sequela ([place]) 06/23/2020  . Pressure injury of skin 06/23/2020  . S/P percutaneous endoscopic gastrostomy (PEG) tube placement ([place])   . Tracheostomy dependent ([place])   . Quadriplegia ([place])   . Neurogenic orthostatic hypotension ([place])   . Acute on chronic anemia   . Hypernatremia   . Neurogenic bladder   . Neurogenic bowel   . Acute respiratory failure with hypoxia and hypercapnia (HCC)   . Autonomic instability   . Cardiac arrest ([place])   . Cervical spinal cord injury, sequela ([place])   . Acute renal injury due to hypovolemia ([place])   . Family history of stomach cancer   . Exercise-induced leg cramps 05/13/2017  . Elevated alkaline phosphatase level 03/13/2015  . Hyperlipidemia   . Hypertension     Past Surgical History:  Procedure Laterality Date  . APPLICATION OF WOUND VAC N/A 08/16/2020   Procedure: APPLICATION OF WOUND VAC;  Surgeon: Fredirick Maudlin, MD;  Location: [facility] ORS;  Service: General;  Laterality: N/A;  . COLONOSCOPY WITH PROPOFOL N/A 12/05/2017   Procedure: COLONOSCOPY WITH PROPOFOL;  Surgeon: Lin Landsman, MD;  Location: [facility] ENDOSCOPY;  Service: Gastroenterology;  Laterality: N/A;  . ESOPHAGOGASTRODUODENOSCOPY  12/05/2017   Procedure: ESOPHAGOGASTRODUODENOSCOPY (EGD);  Surgeon: Lin Landsman, MD;  Location: [place];  Service: Gastroenterology;;  . INCISION AND DRAINAGE ABSCESS N/A 08/16/2020   Procedure: INCISION AND DRAINAGE ABSCESS, SACRAL;  Surgeon: Fredirick Maudlin, MD;  Location: [facility] ORS;  Service: General;   Laterality: N/A;    Prior to Admission medications   Medication Sig Start Date End Date Taking? Authorizing Provider  baclofen (LIORESAL) 10 MG tablet Place 1 tablet (10 mg total) into feeding tube 4 (four) times daily. 01/05/21  Yes Lorella Nimrod, MD  ELIQUIS 5 MG TABS tablet Take 1 tablet by mouth 2 (two) times daily. 02/01/21  Yes [provider]  famotidine (PEPCID) 20 MG tablet Place 1 tablet (20 mg total) into feeding tube daily. 07/20/20  Yes Love, Ivan Anchors, PA-C  ferrous sulfate 300 (60 Fe) MG/5ML syrup Place 1.3 mLs (78 mg total) into feeding tube daily. 07/20/20  Yes Love, Ivan Anchors, PA-C  FLUoxetine (PROZAC) 20 MG/5ML solution Place 10 mLs (40 mg total) into feeding tube daily. 11/22/20  Yes Vigg, Avanti, MD  FLUoxetine (PROZAC) 40 MG capsule Take 40 mg by mouth daily. 02/01/21  Yes [provider]  guaiFENesin 200 MG tablet Place 2 tablets (400 mg total) into feeding tube every 6 (six) hours. 07/20/20  Yes Love, Ivan Anchors, PA-C  ipratropium-albuterol (DUONEB) 0.5-2.5 (3) MG/3ML SOLN Take 3 mLs by nebulization 2 (two) times daily. 01/05/21  Yes Lorella Nimrod, MD  polyvinyl alcohol (LIQUIFILM TEARS) 1.4 % ophthalmic solution Place 1 drop into both eyes as needed for dry eyes. 01/05/21  Yes Lorella Nimrod, MD  scopolamine (TRANSDERM-SCOP) 1 MG/3DAYS Place 1 patch onto the skin every 3 (three) days.   Yes [provider]  sennosides (SENOKOT) 8.8 MG/5ML syrup Place 5 mLs into feeding tube daily at 6 (six) AM. Patient taking differently: Place 5 mLs into feeding tube daily. 07/20/20  Yes Love, Ivan Anchors, PA-C  simethicone (MYLICON) 40 [id number] drops Place 1.2 mLs (80 mg total) into feeding tube 4 (four) times daily. Patient taking differently: Place 80 mg into feeding tube 4 (four) times daily -  before meals and at bedtime. 07/20/20  Yes Love, Ivan Anchors, PA-C  traZODone (DESYREL) 50 MG tablet Place 1 tablet (50 mg total) into feeding tube at bedtime. 10/23/20  Yes Jon Billings, NP  acetaminophen (TYLENOL) 160 MG/5ML solution Place 10-20 mLs (320-640 mg total) into feeding tube every 4 (four) hours as needed for mild pain. 01/05/21   Lorella Nimrod, MD  ascorbic acid (VITAMIN C) 500 MG tablet Place 1 tablet (500 mg total) into feeding tube 2 (two) times daily. Patient not taking: No sig reported 07/18/20   Love, Ivan Anchors, PA-C  bisacodyl (DULCOLAX) 10 MG suppository Place 1 suppository (10 mg total) rectally at bedtime. 07/20/20   Love, Ivan Anchors, PA-C  diclofenac Sodium (VOLTAREN) 1 % GEL Apply 4 g topically 4 (four) times daily as needed (pain). 01/05/21   Lorella Nimrod, MD  docusate sodium (COLACE) 100 MG capsule Take 1 capsule (100 mg total) by mouth 2 (two) times daily as needed for mild constipation. 01/05/21   Lorella Nimrod, MD  insulin aspart (NOVOLOG) 100 UNIT/ML injection Inject 0-15 Units into the skin every 4 (four) hours. Patient not taking: No sig reported 01/05/21   Lorella Nimrod, MD  lidocaine (LIDODERM) 5 % Place 1 patch onto the skin daily. Remove & Discard patch within  12 hours or as directed by MD 07/20/20   Love, Ivan Anchors, PA-C  midodrine (PROAMATINE) 5 MG tablet Place 1 tablet (5 mg total) into feeding tube 2 (two) times daily with a meal. Patient not taking: No sig reported 01/05/21   Lorella Nimrod, MD  modafinil (PROVIGIL) 100 MG tablet Take 1 tablet (100 mg total) by mouth daily. Patient not taking: No sig reported 01/06/21   Lorella Nimrod, MD  nutrition supplement, JUVEN, (JUVEN) PACK Place 1 packet into feeding tube 2 (two) times daily between meals. 01/05/21   Lorella Nimrod, MD  Nutritional Supplements (FEEDING SUPPLEMENT, OSMOLITE 1.5 CAL,) LIQD Place 237 mLs into feeding tube 4 (four) times daily. 01/05/21   Lorella Nimrod, MD  Nutritional Supplements (FEEDING SUPPLEMENT, OSMOLITE 1.5 CAL,) LIQD Place 474 mLs into feeding tube 4 (four) times daily. 01/06/21   Lorella Nimrod, MD  Nutritional Supplements (FEEDING SUPPLEMENT, PROSOURCE TF,) liquid Place  45 mLs into feeding tube 3 (three) times daily. 01/05/21   Lorella Nimrod, MD  ondansetron (ZOFRAN) 4 MG tablet Place 1 tablet (4 mg total) into feeding tube every 8 (eight) hours as needed for nausea, vomiting or refractory nausea / vomiting. 07/20/20   Love, Ivan Anchors, PA-C  polycarbophil (FIBERCON) 625 MG tablet Place 1 tablet (625 mg total) into feeding tube daily. Patient not taking: No sig reported 07/20/20   Love, Ivan Anchors, PA-C  rivaroxaban (XARELTO) 10 MG TABS tablet Take one tablet per tube daily Patient not taking: No sig reported 08/08/20   [name], MD  saccharomyces boulardii (FLORASTOR) 250 MG capsule Place 1 capsule (250 mg total) into feeding tube 2 (two) times daily. Patient not taking: No sig reported 07/20/20   Love, Ivan Anchors, PA-C  sodium chloride flush (NS) 0.9 % SOLN 10-40 mLs by Intracatheter route as needed (flush). 01/05/21   Lorella Nimrod, MD  Water For Irrigation, Sterile (FREE WATER) SOLN Place 400 mLs into feeding tube every 4 (four) hours. Patient taking differently: Place 300 mLs into feeding tube every 4 (four) hours. 07/20/20   Love, Ivan Anchors, PA-C  zinc sulfate 220 (50 Zn) MG capsule Place 1 capsule (220 mg total) into feeding tube daily. Patient not taking: No sig reported 07/20/20   Love, Ivan Anchors, PA-C    Allergies Shrimp (diagnostic)  Family History  Problem Relation Age of Onset  . Cancer Father        lung  . Cancer Brother        throat  . Heart disease Brother 21  . Liver cancer Brother   . Alcohol abuse Brother     Social History Social History   Tobacco Use  . Smoking status: Never Smoker  . Smokeless tobacco: Never Used  Vaping Use  . Vaping Use: Never used  Substance Use Topics  . Alcohol use: Not Currently  . Drug use: No    Review of Systems  Review of Systems  Unable to perform ROS: Other    Severe secretions suctioned multiple times throughout initial  interview.  ____________________________________________   PHYSICAL EXAM:  VITAL SIGNS: ED Triage Vitals  Enc Vitals Group     BP      Pulse      Resp      Temp      Temp src      SpO2      Weight      Height      Head Circumference      Peak Flow  Pain Score      Pain Loc      Pain Edu?      Excl. in [place]?    Vitals:   02/04/21 1533 02/04/21 1555  BP: (!) 84/53 104/74  Pulse: 96 100  Resp: (!) 29 19  Temp:    SpO2: 98% 100%   Physical Exam Vitals and nursing note reviewed.  Constitutional:      Appearance: He is well-developed.     Interventions: He is intubated.  HENT:     Head: Normocephalic and atraumatic.     Right Ear: External ear normal.     Left Ear: External ear normal.     Nose: Nose normal.  Eyes:     Conjunctiva/sclera: Conjunctivae normal.  Cardiovascular:     Rate and Rhythm: Regular rhythm. Tachycardia present.     Heart sounds: No murmur heard.   Pulmonary:     Effort: Pulmonary effort is normal. Tachypnea present. No respiratory distress. He is intubated.     Breath sounds: Decreased breath sounds present.     Comments: Tracheostomy in place.  There are some secretions which were easily suctioned. Abdominal:     Palpations: Abdomen is soft.     Tenderness: There is no abdominal tenderness.  Musculoskeletal:     Cervical back: Neck supple.     Right lower leg: Edema present.     Left lower leg: Edema present.  Skin:    General: Skin is warm and dry.  Neurological:     Mental Status: He is alert.     Right upper extremity PICC line has some surrounding purulence. ____________________________________________   LABS (all labs ordered are listed, but only abnormal results are displayed)  Labs Reviewed  CBC WITH DIFFERENTIAL/PLATELET - Abnormal; Notable for the following components:      Result Value   WBC 11.6 (*)    RBC 4.02 (*)    Hemoglobin 11.5 (*)    HCT 37.3 (*)    RDW 16.9 (*)    Monocytes Absolute 1.3 (*)     Eosinophils Absolute 2.0 (*)    All other components within normal limits  COMPREHENSIVE METABOLIC PANEL - Abnormal; Notable for the following components:   Sodium 157 (*)    Chloride 118 (*)    Glucose, Bld 177 (*)    BUN 54 (*)    Creatinine, Ser 1.30 (*)    Albumin 3.0 (*)    AST 78 (*)    ALT 80 (*)    Alkaline Phosphatase 188 (*)    GFR, Estimated 60 (*)    All other components within normal limits  URINALYSIS, COMPLETE (UACMP) WITH MICROSCOPIC - Abnormal; Notable for the following components:   Color, Urine YELLOW (*)    APPearance CLOUDY (*)    Protein, ur 30 (*)    Leukocytes,Ua LARGE (*)    WBC, UA >50 (*)    Bacteria, UA RARE (*)    All other components within normal limits  LACTIC ACID, PLASMA - Abnormal; Notable for the following components:   Lactic Acid, Venous 2.1 (*)    All other components within normal limits  PROTIME-INR - Abnormal; Notable for the following components:   Prothrombin Time 19.0 (*)    INR 1.6 (*)    All other components within normal limits  APTT - Abnormal; Notable for the following components:   aPTT 39 (*)    All other components within normal limits  RESP PANEL BY RT-PCR (FLU A&B, COVID)  ARPGX2  URINE CULTURE  CULTURE, BLOOD (SINGLE)  PROCALCITONIN  LACTIC ACID, PLASMA   ____________________________________________  EKG  ____________________________________________  RADIOLOGY  ED MD interpretation: Little bit of patchiness at the right base without other clear focal consolidation, overt edema, thorax or other clear acute intrathoracic process.  This consistent with atelectasis versus possible early pneumonia.  Official radiology report(s): No results found.  ____________________________________________   PROCEDURES  Procedure(s) performed (including Critical Care):  .Foreign Body Removal  Date/Time: 02/04/2021 4:06 PM Performed by: Lucrezia Starch, MD Authorized by: Lucrezia Starch, MD  Consent: Verbal consent  obtained. Consent given by: guardian Body area: skin General location: upper extremity Location details: right upper arm  Sedation: Patient sedated: no  Patient restrained: no Patient cooperative: no Tendon involvement: none Complexity: simple 1 objects recovered. Objects recovered: PICC line  Post-procedure assessment: foreign body removed Patient tolerance: patient tolerated the procedure well with no immediate complications .Critical Care Performed by: Lucrezia Starch, MD Authorized by: Lucrezia Starch, MD   Critical care provider statement:    Critical care time (minutes):  45   Critical care was necessary to treat or prevent imminent or life-threatening deterioration of the following conditions:  Dehydration (Na >150)   Critical care was time spent personally by me on the following activities:  Discussions with consultants, evaluation of patient's response to treatment, examination of patient, ordering and performing treatments and interventions, ordering and review of laboratory studies, ordering and review of radiographic studies, pulse oximetry, re-evaluation of patient's condition, obtaining history from patient or surrogate and review of old charts     ____________________________________________   INITIAL IMPRESSION / [place] / ED COURSE     Patient presents with above-stated history exam with concerns that he developed some purulence around his right upper extremity PICC line.  She is receiving IV antibiotics at home for his osteomyelitis UTI.  On arrival patient is noted to be tachypneic at 29 with otherwise stable vital signs.  Does have some purulence noted around his right upper extremity PICC line insertion site which was removed.  We will family later at bedside denies any other acute concerns noted sacral wounds are healing well and has not had any new cough or shortness of breath or fever or any other associated sick symptoms and concerned about  possible acute infectious process given tachypnea and CBC with elevated white blood cell count 11,000.  CMP is also remarkable for hyponatremia with a sodium of 157 and chloride of 118 as well as a glucose of 177 and a creatinine of 1.3 representing an AKI as it was 0.886 days ago.  He also has a mild transaminitis.  Bilirubin is normal.  COVID and flu is negative.  Procalcitonin is undetectable.  We will exchange Foley.  Urinalysis with large LES and endocrine 50 WBCs with rare bacteria.  Lactic acid is minimally elevated 2.1.  Blood and urine culture sent.  I will admit to medicine service for further evaluation and management. ____________________________________________   FINAL CLINICAL IMPRESSION(S) / ED DIAGNOSES  Final diagnoses:  Infection of peripherally inserted central catheter (PICC), initial encounter  Tracheostomy care ([place])  Quadriplegia ([place])  Hypernatremia  Hyperglycemia  Wound of sacral region, initial encounter  Urinary tract infection associated with indwelling urethral catheter, subsequent encounter  Hyperchloremia    Medications  acetaminophen (TYLENOL) tablet 650 mg (has no administration in time range)    Or  acetaminophen (TYLENOL) suppository 650 mg (has no administration in time range)  ondansetron (ZOFRAN) tablet 4 mg (has no administration in time range)    Or  ondansetron (ZOFRAN) injection 4 mg (has no administration in time range)  enoxaparin (LOVENOX) injection 40 mg (has no administration in time range)  0.45 % sodium chloride infusion (has no administration in time range)  lactated ringers bolus 1,000 mL (1,000 mLs Intravenous New Bag/Given 02/04/21 1533)     ED Discharge Orders    None       Note:  This document was prepared using Dragon voice recognition software and may include unintentional dictation errors.   Lucrezia Starch, MD 02/04/21 1623    Lucrezia Starch, MD 02/04/21 1629    Lucrezia Starch, MD 02/04/21 1640

## 2021-02-04 NOTE — H&P (Signed)
History and Physical   Ryan Day ZOX:[id number] DOB: [birth year] DOA: 02/04/2021  PCP: Valerie Roys, DO  Outpatient Specialists: Dr. Yaakov Guthrie, infectious disease Patient coming from: Home  I have personally briefly reviewed patient's old medical records in [place].  Chief Concern: PICC line drainage  HPI: Ryan Day is a 70 y.o. 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  Per daughter at bedside this has been going on for 2 days.  She reports that the drainage is green and purulent.  She also endorses swelling that started on 02/03/2021, prompting him to present to emergency department today.  At bedside, patient was able to tell me his name, his age and he knows he is in the hospital.  He verbalizes his daughter at bedside.  He does not have any complaints he states that he is feeling just fine.  Social history: Patient lives at home with his daughter.  He denies tobacco, EtOH, recreational drug use.  Vaccination: Patient is vaccinated for COVID-19 with 2 doses  ROS: Constitutional: no weight change, no fever ENT/Mouth: no sore throat, no rhinorrhea Eyes: no eye pain, no vision changes Cardiovascular: no chest pain, no dyspnea,  no edema, no palpitations Respiratory: no cough, no sputum, no wheezing Gastrointestinal: no nausea, no vomiting, no diarrhea, no constipation Genitourinary: no urinary incontinence, no dysuria, no hematuria Musculoskeletal: no arthralgias, no myalgias Skin: no skin lesions, no pruritus, Neuro: + weakness, no loss of consciousness, no syncope Psych: no anxiety, no depression, + decrease  appetite Heme/Lymph: no bruising, no bleeding  ED Course: Discussed with ED provider, patient requiring hospitalization for PICC line drainage.  Vitals in the emergency department was remarkable for temperature of 98.4, respiration rate of 23, heart rate 96, blood pressure 113/68, SPO2 100% on room air.  Labs in the emergency department was remarkable for sodium level 157, chloride 118, BUN 54, potassium 4.0, bicarb 31, serum creatinine 1.30, nonfasting blood glucose 177, WBC 11.6, hemoglobin 11.5, platelets 227.  Alk phos was 188, albumin 3.0, AST 78, ALT 80.  Lactic acid 2.0, Pro-Cal less than 0.10.  Assessment/Plan  Principal Problem:   AKI (acute kidney injury) ([place]) Active Problems:   Hypertension   Elevated alkaline phosphatase level   Cervical spinal cord injury, sequela (HCC)   Spinal cord injury, cervical region, sequela (HCC)   Pressure injury of skin   S/P percutaneous endoscopic gastrostomy (PEG) tube placement ([place])   Tracheostomy dependent (HCC)   Quadriplegia (HCC)   Neurogenic orthostatic hypotension (HCC)   Neurogenic bowel   Decubitus ulcer of sacral region, stage 4 ([place])   # Acute kidney injury suspect secondary to dehydration # Hypernatremia and hyperchloremia secondary to dehydration - Patient's daughter at bedside endorses that patient gets free water 200 mL every 2 hours flushes while awake at home - I increased free water flushes to 400 mL every 2 hours - Half-normal saline at 125 mL/h, 1 day ordered - Consult to registered dietitian ordered - BMP in the a.m.  # History of mild osteomyelitis of the sacrum/coccyx # Leukocytosis - Patient received his daily dose of ertapenem IV and vancomycin IV in the a.m. prior to ED presentation - vancomycin IV every 12 hours and ertapenem IV daily have been resumed -Cultures grew  Pseudomonas, MRSA as per below chart review - I reviewed ID note on 01/25/2021: There was no mention of ertapenem and at that time ID  impression was to continue ciprofloxacin at that time and once patient completes 1 week of ciprofloxacin to switch back to IV cefepime with tentative end date of 02/17/2021 to complete 6 weeks of treatment for osteomyelitis -A.m. team to complete med reconciliation and research why patient was switched to ertapenem -Recommend a.m. team to consult infectious disease  # Leukocytosis-may be secondary to dehydration # Concern for bacteremia cannot be excluded given patient presenting with purulence in PICC line -Blood cultures x2 ordered Blood cultures x2, urine cultures x2 blood cultures x2, urine culture ordered -urine cultures x2 ordered  # Right upper extremity swelling- venous ultrasound of the right upper extremity ordered to assess for DVT  # Possible developing pneumonia superimposed on atelectasis in the left base- check procalcitonin -If negative will not order additional antibiotics  # Depression/anxiety-resumed home fluoxetine 40 mg per tube daily  # Insomnia-resumed trazodone 50 mg nightly per tube  # Multiple wounds including sacral decubitus ulcer-present on admission -Wound nurse has been consulted -Images uploaded to media  # Patient is on chronic anticoagulation- presumed secondary to bedbound state and prescribed for DVT prophylaxis - Eliquis 5 mg twice daily, he is high given that dosing for reduced risk for DVT is 2.5 mg p.o. twice daily - I have reviewed the chart and there is no history of pulmonary embolism or atrial fibrillation - A.m. team to complete med reconciliation  # PICC line drainage- PICC line exchange order placed  Bariatric bed ordered due to patient's height  Chart reviewed.   Hospitalization from 12/07/2020 to 01/05/2021:   12/07/2020: Patient admitted to ICU with urosepsis requiring vasopressors and acute on chronic hypoxic respiratory failure secondary to mucous plugging requiring exchange of cuffless trach to size 6 mm cuff trach to be placed on  mechanical ventilation.  Right femoral central line was placed by EDP at that time.  CT abdomen and pelvis showed cystitis.  Sigmoid diverticulosis.  Sacral decubitus ulcer. CT of the head revealed mild chronic ischemic white matter disease.  No acute intracranial abnormality seen.  Patient was started on vancomycin and cefepime IV.  Patient was difficult to extubate and failed multiple SBT attempts due to becoming bradycardic. Blood cultures collected on 12/07/2020-grew MRSA, Enterobacter Cloacae, Enterococcus faecalis Sacral wound grew rare Pseudomonas aeruginosa, MRSA,  Candida albicans  12/08/2020- bronchioloalveolar lavage grew more than 100,000 colonies of Pseudomonas, MRSA 40,000, Enterococcus faecalis 20,000  12/13/2020- blood cultures collected did not show any growth on discharge  12/27/2020: Patient was difficult to wean off of ventilation, family wife and daughter who are nurses discussed comfort care.  12/31/2020: Patient began weaning off sedated, began SBT therapy, glycopyrrolate was given for copious secretion.  01/01/2021- patient tolerated TC 01/04/2021- [facility] resumed care  01/05/2021-patient was discharged to [facility] [facility]  DVT prophylaxis: Apixaban 5 mg twice daily resumed Code Status: Full code, patient states he has not made up his mind yet Diet: Tube feed, per home diet resumed Family Communication: Discussed and updated daughter at bedside Disposition Plan: Pending clinical course Consults called: None at this time Admission status: MedSurg, observation, telemetry  Past Medical History:  Diagnosis Date  . Acute on chronic respiratory failure with hypoxia ([place])   . Acute renal injury due to hypovolemia ([place])   . Autonomic instability   . Cardiac arrest ([place])   . Cervical spinal  cord injury, sequela ([place])   . Elevated alkaline phosphatase level   . 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 Hyperlipidemia   . Hypertension   . Paralysis ([place])   .  PEG (percutaneous endoscopic gastrostomy) status ([place])   . Tracheostomy dependence ([place])   . Urinary catheter in place    Past Surgical History:  Procedure Laterality Date  . APPLICATION OF WOUND VAC N/A 08/16/2020   Procedure: APPLICATION OF WOUND VAC;  Surgeon: Fredirick Maudlin, MD;  Location: [facility] ORS;  Service: General;  Laterality: N/A;  . COLONOSCOPY WITH PROPOFOL N/A 12/05/2017   Procedure: COLONOSCOPY WITH PROPOFOL;  Surgeon: Lin Landsman, MD;  Location: [facility] ENDOSCOPY;  Service: Gastroenterology;  Laterality: N/A;  . ESOPHAGOGASTRODUODENOSCOPY  12/05/2017   Procedure: ESOPHAGOGASTRODUODENOSCOPY (EGD);  Surgeon: Lin Landsman, MD;  Location: [facility];  Service: Gastroenterology;;  . INCISION AND DRAINAGE ABSCESS N/A 08/16/2020   Procedure: INCISION AND DRAINAGE ABSCESS, SACRAL;  Surgeon: Fredirick Maudlin, MD;  Location: [facility] ORS;  Service: General;  Laterality: N/A;   Social History:  reports that he has never smoked. He has never used smokeless tobacco. He reports previous alcohol use. He reports that he does not use drugs.  Allergies  Allergen Reactions  . Shrimp (Diagnostic)     Experiences facial droop when eating seafood   Family History  Problem Relation Age of Onset  . Cancer Father        lung  . Cancer Brother        throat  . Heart disease Brother 57  . Liver cancer Brother   . Alcohol abuse Brother    Family history: Family history reviewed and not pertinent  Prior to Admission medications   Medication Sig Start Date End Date Taking? Authorizing Provider  baclofen (LIORESAL) 10 MG tablet Place 1 tablet (10 mg total) into feeding tube 4 (four) times daily. 01/05/21  Yes Lorella Nimrod, MD  ELIQUIS 5 MG TABS tablet Take 1 tablet by mouth 2 (two) times daily. 02/01/21  Yes [provider]  ertapenem (INVANZ) IVPB Inject 1 g into the vein.   Yes [provider]  famotidine (PEPCID) 20 MG tablet Place 1 tablet (20 mg total) into  feeding tube daily. 07/20/20  Yes Love, Ivan Anchors, PA-C  ferrous sulfate 300 (60 Fe) MG/5ML syrup Place 1.3 mLs (78 mg total) into feeding tube daily. 07/20/20  Yes Love, Ivan Anchors, PA-C  FLUoxetine (PROZAC) 20 MG/5ML solution Place 10 mLs (40 mg total) into feeding tube daily. 11/22/20  Yes Vigg, Avanti, MD  FLUoxetine (PROZAC) 40 MG capsule Take 40 mg by mouth daily. 02/01/21  Yes [provider]  guaiFENesin 200 MG tablet Place 2 tablets (400 mg total) into feeding tube every 6 (six) hours. 07/20/20  Yes Love, Ivan Anchors, PA-C  ipratropium-albuterol (DUONEB) 0.5-2.5 (3) MG/3ML SOLN Take 3 mLs by nebulization 2 (two) times daily. 01/05/21  Yes Lorella Nimrod, MD  polyvinyl alcohol (LIQUIFILM TEARS) 1.4 % ophthalmic solution Place 1 drop into both eyes as needed for dry eyes. 01/05/21  Yes Lorella Nimrod, MD  scopolamine (TRANSDERM-SCOP) 1 MG/3DAYS Place 1 patch onto the skin every 3 (three) days.   Yes [provider]  sennosides (SENOKOT) 8.8 MG/5ML syrup Place 5 mLs into feeding tube daily at 6 (six) AM. Patient taking differently: Place 5 mLs into feeding tube daily. 07/20/20  Yes Love, Ivan Anchors, PA-C  simethicone (MYLICON) 40 BZ/1.6RC drops Place 1.2 mLs (80 mg total) into feeding  tube 4 (four) times daily. Patient taking differently: Place 80 mg into feeding tube 4 (four) times daily -  before meals and at bedtime. 07/20/20  Yes Love, Ivan Anchors, PA-C  traZODone (DESYREL) 50 MG tablet Place 1 tablet (50 mg total) into feeding tube at bedtime. 10/23/20  Yes Jon Billings, NP  vancomycin IVPB Inject 500 mg into the vein. 125 ml / hour   Yes [provider]  acetaminophen (TYLENOL) 160 MG/5ML solution Place 10-20 mLs (320-640 mg total) into feeding tube every 4 (four) hours as needed for mild pain. 01/05/21   Lorella Nimrod, MD  ascorbic acid (VITAMIN C) 500 MG tablet Place 1 tablet (500 mg total) into feeding tube 2 (two) times daily. Patient not taking: No sig reported 07/18/20    Love, Ivan Anchors, PA-C  bisacodyl (DULCOLAX) 10 MG suppository Place 1 suppository (10 mg total) rectally at bedtime. 07/20/20   Love, Ivan Anchors, PA-C  diclofenac Sodium (VOLTAREN) 1 % GEL Apply 4 g topically 4 (four) times daily as needed (pain). 01/05/21   Lorella Nimrod, MD  docusate sodium (COLACE) 100 MG capsule Take 1 capsule (100 mg total) by mouth 2 (two) times daily as needed for mild constipation. 01/05/21   Lorella Nimrod, MD  insulin aspart (NOVOLOG) 100 UNIT/ML injection Inject 0-15 Units into the skin every 4 (four) hours. Patient not taking: No sig reported 01/05/21   Lorella Nimrod, MD  lidocaine (LIDODERM) 5 % Place 1 patch onto the skin daily. Remove & Discard patch within 12 hours or as directed by MD 07/20/20   Love, Ivan Anchors, PA-C  midodrine (PROAMATINE) 5 MG tablet Place 1 tablet (5 mg total) into feeding tube 2 (two) times daily with a meal. Patient not taking: No sig reported 01/05/21   Lorella Nimrod, MD  modafinil (PROVIGIL) 100 MG tablet Take 1 tablet (100 mg total) by mouth daily. Patient not taking: No sig reported 01/06/21   Lorella Nimrod, MD  nutrition supplement, JUVEN, (JUVEN) PACK Place 1 packet into feeding tube 2 (two) times daily between meals. 01/05/21   Lorella Nimrod, MD  Nutritional Supplements (FEEDING SUPPLEMENT, OSMOLITE 1.5 CAL,) LIQD Place 237 mLs into feeding tube 4 (four) times daily. 01/05/21   Lorella Nimrod, MD  Nutritional Supplements (FEEDING SUPPLEMENT, OSMOLITE 1.5 CAL,) LIQD Place 474 mLs into feeding tube 4 (four) times daily. 01/06/21   Lorella Nimrod, MD  Nutritional Supplements (FEEDING SUPPLEMENT, PROSOURCE TF,) liquid Place 45 mLs into feeding tube 3 (three) times daily. 01/05/21   Lorella Nimrod, MD  ondansetron (ZOFRAN) 4 MG tablet Place 1 tablet (4 mg total) into feeding tube every 8 (eight) hours as needed for nausea, vomiting or refractory nausea / vomiting. 07/20/20   Love, Ivan Anchors, PA-C  polycarbophil (FIBERCON) 625 MG tablet Place 1 tablet (625 mg  total) into feeding tube daily. Patient not taking: No sig reported 07/20/20   Love, Ivan Anchors, PA-C  rivaroxaban (XARELTO) 10 MG TABS tablet Take one tablet per tube daily Patient not taking: No sig reported 08/08/20   [name], MD  saccharomyces boulardii (FLORASTOR) 250 MG capsule Place 1 capsule (250 mg total) into feeding tube 2 (two) times daily. Patient not taking: No sig reported 07/20/20   Love, Ivan Anchors, PA-C  sodium chloride flush (NS) 0.9 % SOLN 10-40 mLs by Intracatheter route as needed (flush). 01/05/21   Lorella Nimrod, MD  Water For Irrigation, Sterile (FREE WATER) SOLN Place 400 mLs into feeding tube every 4 (four) hours. Patient  taking differently: Place 300 mLs into feeding tube every 4 (four) hours. 07/20/20   Love, Ivan Anchors, PA-C  zinc sulfate 220 (50 Zn) MG capsule Place 1 capsule (220 mg total) into feeding tube daily. Patient not taking: No sig reported 07/20/20   Flora Lipps   Physical Exam: Vitals:   02/04/21 1533 02/04/21 1555 02/04/21 1700 02/04/21 1807  BP: (!) 84/53 104/74 113/68 (!) 117/56  Pulse: 96 100 93 93  Resp: (!) 29 19 (!) 21 18  Temp:      TempSrc:      SpO2: 98% 100% 100% 100%  Weight:       Constitutional: appears age-appropriate, NAD, calm, comfortable Eyes: PERRL, lids and conjunctivae normal ENMT: Mucous membranes are dry. Posterior pharynx clear of any exudate or lesions. Age-appropriate dentition. Hearing appropriate.  Tracheostomy in place Neck: normal, supple, no masses, no thyromegaly Respiratory: clear to auscultation bilaterally, no wheezing, no crackles. Normal respiratory effort. No accessory muscle use.  Cardiovascular: Regular rate and rhythm, no murmurs / rubs / gallops. No extremity edema. 2+ pedal pulses. No carotid bruits.  Abdomen: no tenderness, no masses palpated, no hepatosplenomegaly. Bowel sounds positive.  Musculoskeletal: no clubbing / cyanosis. No joint deformity upper and lower extremities. Good ROM, no  contractures, no atrophy. Normal muscle tone.  Skin: no rashes, lesions, ulcers. No induration.  Multiple wounds present on admission.  Unstageable sacral decubitus ulcer. Neurologic: Sensation intact. Strength 5/5 in all 4.  Psychiatric: Normal judgment and insight. Alert and oriented x 3. Normal mood.   EKG: Not indicated  Chest x-ray on Admission: I personally reviewed and I agree with radiologist reading as below.  DG Chest Port 1 View  Result Date: 02/04/2021 CLINICAL DATA:  Tracheostomy in place.  Concern for infection EXAM: PORTABLE CHEST 1 VIEW COMPARISON:  Jan 21, 2021 FINDINGS: Previously noted peripherally inserted central catheter is not appreciable on this examination. Tracheostomy catheter tip is 6.0 cm above the carina. No pneumothorax. There is atelectatic change in the left base. Lungs elsewhere clear. Heart is upper normal in size with pulmonary vascularity normal. No adenopathy. Postoperative change noted in visualized cervical spine. IMPRESSION: Previously noted central catheter not appreciable. Tracheostomy as described without pneumothorax. Atelectasis left base. Developing pneumonia superimposed on atelectasis in this area in the left base is questioned. Lungs elsewhere clear. Stable cardiac silhouette. Electronically Signed   By: Lowella Grip III M.D.   On: 02/04/2021 16:52   Labs on Admission: I have personally reviewed following labs  CBC: Recent Labs  Lab 01/29/21 0653 02/04/21 1450  WBC 6.3 11.6*  NEUTROABS  --  6.4  HGB 10.2* 11.5*  HCT 31.5* 37.3*  MCV 89.5 92.8  PLT 189 203   Basic Metabolic Panel: Recent Labs  Lab 01/29/21 0653 02/04/21 1450  NA 142 157*  K 3.7 4.0  CL 104 118*  CO2 31 31  GLUCOSE 106* 177*  BUN 33* 54*  CREATININE 0.88 1.30*  CALCIUM 10.5* 10.1  MG 2.2  --   PHOS 3.1  --    GFR: Estimated Creatinine Clearance: 75 mL/min (A) (by C-G formula based on SCr of 1.3 mg/dL (H)).  Liver Function Tests: Recent Labs  Lab  02/04/21 1450  AST 78*  ALT 80*  ALKPHOS 188*  BILITOT 1.0  PROT 7.8  ALBUMIN 3.0*   Coagulation Profile: Recent Labs  Lab 02/04/21 1521  INR 1.6*   Urine analysis:    Component Value Date/Time   COLORURINE YELLOW (A)  02/04/2021 1521   APPEARANCEUR CLOUDY (A) 02/04/2021 1521   APPEARANCEUR Cloudy (A) 12/07/2020 1055   LABSPEC 1.014 02/04/2021 1521   PHURINE 6.0 02/04/2021 1521   GLUCOSEU NEGATIVE 02/04/2021 1521   HGBUR NEGATIVE 02/04/2021 [name] 02/04/2021 1521   BILIRUBINUR Negative 12/07/2020 [place] 02/04/2021 1521   PROTEINUR 30 (A) 02/04/2021 1521   NITRITE NEGATIVE 02/04/2021 1521   LEUKOCYTESUR LARGE (A) 02/04/2021 1521   Jareth Pardee N Montzerrat Brunell D.O. [facility]  If 7PM-7AM, please contact overnight-coverage provider If 7AM-7PM, please contact day coverage provider [email or web address]  02/04/2021, 7:18 PM

## 2021-02-05 ENCOUNTER — Encounter: Payer: Self-pay | Admitting: Internal Medicine

## 2021-02-05 DIAGNOSIS — L89620 Pressure ulcer of left heel, unstageable: Secondary | ICD-10-CM | POA: Diagnosis present

## 2021-02-05 DIAGNOSIS — Z20822 Contact with and (suspected) exposure to covid-19: Secondary | ICD-10-CM | POA: Diagnosis present

## 2021-02-05 DIAGNOSIS — Z515 Encounter for palliative care: Secondary | ICD-10-CM | POA: Diagnosis not present

## 2021-02-05 DIAGNOSIS — R1310 Dysphagia, unspecified: Secondary | ICD-10-CM | POA: Diagnosis present

## 2021-02-05 DIAGNOSIS — J9611 Chronic respiratory failure with hypoxia: Secondary | ICD-10-CM | POA: Diagnosis present

## 2021-02-05 DIAGNOSIS — S14109S Unspecified injury at unspecified level of cervical spinal cord, sequela: Secondary | ICD-10-CM | POA: Diagnosis not present

## 2021-02-05 DIAGNOSIS — T80212A Local infection due to central venous catheter, initial encounter: Secondary | ICD-10-CM | POA: Diagnosis present

## 2021-02-05 DIAGNOSIS — E870 Hyperosmolality and hypernatremia: Secondary | ICD-10-CM

## 2021-02-05 DIAGNOSIS — M4628 Osteomyelitis of vertebra, sacral and sacrococcygeal region: Secondary | ICD-10-CM | POA: Diagnosis present

## 2021-02-05 DIAGNOSIS — Y831 Surgical operation with implant of artificial internal device as the cause of abnormal reaction of the patient, or of later complication, without mention of misadventure at the time of the procedure: Secondary | ICD-10-CM | POA: Diagnosis present

## 2021-02-05 DIAGNOSIS — G8250 Quadriplegia, unspecified: Secondary | ICD-10-CM | POA: Diagnosis present

## 2021-02-05 DIAGNOSIS — I10 Essential (primary) hypertension: Secondary | ICD-10-CM | POA: Diagnosis present

## 2021-02-05 DIAGNOSIS — E785 Hyperlipidemia, unspecified: Secondary | ICD-10-CM | POA: Diagnosis present

## 2021-02-05 DIAGNOSIS — G903 Multi-system degeneration of the autonomic nervous system: Secondary | ICD-10-CM | POA: Diagnosis present

## 2021-02-05 DIAGNOSIS — L89610 Pressure ulcer of right heel, unstageable: Secondary | ICD-10-CM | POA: Diagnosis present

## 2021-02-05 DIAGNOSIS — Y92009 Unspecified place in unspecified non-institutional (private) residence as the place of occurrence of the external cause: Secondary | ICD-10-CM | POA: Diagnosis not present

## 2021-02-05 DIAGNOSIS — L89153 Pressure ulcer of sacral region, stage 3: Secondary | ICD-10-CM | POA: Diagnosis present

## 2021-02-05 DIAGNOSIS — G4700 Insomnia, unspecified: Secondary | ICD-10-CM | POA: Diagnosis present

## 2021-02-05 DIAGNOSIS — S14153S Other incomplete lesion at C3 level of cervical spinal cord, sequela: Secondary | ICD-10-CM | POA: Diagnosis not present

## 2021-02-05 DIAGNOSIS — L03113 Cellulitis of right upper limb: Secondary | ICD-10-CM | POA: Diagnosis present

## 2021-02-05 DIAGNOSIS — E878 Other disorders of electrolyte and fluid balance, not elsewhere classified: Secondary | ICD-10-CM | POA: Diagnosis present

## 2021-02-05 DIAGNOSIS — R748 Abnormal levels of other serum enzymes: Secondary | ICD-10-CM | POA: Diagnosis present

## 2021-02-05 DIAGNOSIS — E860 Dehydration: Secondary | ICD-10-CM | POA: Diagnosis present

## 2021-02-05 DIAGNOSIS — R739 Hyperglycemia, unspecified: Secondary | ICD-10-CM | POA: Diagnosis present

## 2021-02-05 DIAGNOSIS — Z930 Tracheostomy status: Secondary | ICD-10-CM

## 2021-02-05 DIAGNOSIS — K592 Neurogenic bowel, not elsewhere classified: Secondary | ICD-10-CM | POA: Diagnosis present

## 2021-02-05 DIAGNOSIS — N179 Acute kidney failure, unspecified: Secondary | ICD-10-CM | POA: Diagnosis present

## 2021-02-05 DIAGNOSIS — Z66 Do not resuscitate: Secondary | ICD-10-CM | POA: Diagnosis present

## 2021-02-05 DIAGNOSIS — W1830XS Fall on same level, unspecified, sequela: Secondary | ICD-10-CM | POA: Diagnosis not present

## 2021-02-05 DIAGNOSIS — F3289 Other specified depressive episodes: Secondary | ICD-10-CM | POA: Diagnosis not present

## 2021-02-05 LAB — BASIC METABOLIC PANEL
Anion gap: 7 (ref 5–15)
BUN: 50 mg/dL — ABNORMAL HIGH (ref 8–23)
CO2: 27 mmol/L (ref 22–32)
Calcium: 9.6 mg/dL (ref 8.9–10.3)
Chloride: 120 mmol/L — ABNORMAL HIGH (ref 98–111)
Creatinine, Ser: 1.14 mg/dL (ref 0.61–1.24)
GFR, Estimated: >60 mL/min (ref >60)
Glucose, Bld: 189 mg/dL — ABNORMAL HIGH (ref 70–99)
Potassium: 3.8 mmol/L (ref 3.5–5.1)
Sodium: 154 mmol/L — ABNORMAL HIGH (ref 135–145)

## 2021-02-05 LAB — CBC
HCT: 35.5 % — ABNORMAL LOW (ref 39.0–52.0)
Hemoglobin: 11.1 g/dL — ABNORMAL LOW (ref 13.0–17.0)
MCH: 28.8 pg (ref 26.0–34.0)
MCHC: 31.3 g/dL (ref 30.0–36.0)
MCV: 92 fL (ref 80.0–100.0)
Platelets: 176 10*3/uL (ref 150–400)
RBC: 3.86 MIL/uL — ABNORMAL LOW (ref 4.22–5.81)
RDW: 16.6 % — ABNORMAL HIGH (ref 11.5–15.5)
WBC: 9.2 10*3/uL (ref 4.0–10.5)
nRBC: 0 % (ref 0.0–0.2)

## 2021-02-05 MED ORDER — COLLAGENASE 250 UNIT/GM EX OINT
TOPICAL_OINTMENT | Freq: Every day | CUTANEOUS | Status: DC
  Filled 2021-02-05: qty 30

## 2021-02-05 MED ORDER — CHLORHEXIDINE GLUCONATE CLOTH 2 % EX PADS
6.0000 | MEDICATED_PAD | Freq: Every day | CUTANEOUS | Status: DC
  Administered 2021-02-05 – 2021-02-12 (×8): 6 via TOPICAL

## 2021-02-05 MED ORDER — APIXABAN 2.5 MG PO TABS
2.5000 mg | ORAL_TABLET | Freq: Two times a day (BID) | ORAL | Status: DC
  Administered 2021-02-05 – 2021-02-07 (×4): 2.5 mg via ORAL
  Filled 2021-02-05 (×4): qty 1

## 2021-02-05 MED ORDER — FREE WATER
400.0000 mL | Status: DC
  Administered 2021-02-05 – 2021-02-08 (×36): 400 mL
  Administered 2021-02-08: 200 mL
  Administered 2021-02-08 – 2021-02-12 (×43): 400 mL

## 2021-02-05 NOTE — Progress Notes (Addendum)
Initial Nutrition Assessment  DOCUMENTATION CODES:  Not applicable  INTERVENTION:   Change diet order to NPO as pt does not take anything by mouth  1 packet Juven BID, each packet provides 95 calories, 2.5 grams of protein (collagen), and 9.8 grams of carbohydrate (3 grams sugar); also contains 7 grams of L-arginine and L-glutamine, 300 mg vitamin C, 15 mg vitamin E, 1.2 mcg vitamin B-12, 9.5 mg zinc, 200 mg calcium, and 1.5 g  Calcium Beta-hydroxy-Beta-methylbutyrate to support wound healing  Continue tube feeding via PEG: 2 cartons (474 ml) every 6 hours (8 cartons / 1896 ml per day). Prosource TF 45 ml BID  This regimen provides 2924 kcal, 141 gm protein, 1445 ml free water daily   Free water: 476mL q2h per MD (adds an additional 4800 mL/d)  NUTRITION DIAGNOSIS:  Increased nutrient needs related to wound healing as evidenced by estimated needs.  GOAL:  Patient will meet greater than or equal to 90% of their needs  MONITOR:  TF tolerance  REASON FOR ASSESSMENT:  Consult Enteral/tube feeding initiation and management  ASSESSMENT:  Pt presented to ED with concerns for PICC line infection. Family reports green malodorous discharge x 2 days at the site with swelling. On TF at home with chronic PEG in place. PMH relevant for quadriplegia, chronic tracheostomy, HTN, HLD, PEG, chronic osteomyelitis, neurogenic bladder requiring chronic foley   Pt resting in bed at the time of visit, no family present at bedside. Pt has a quite voice, difficult to hear all answers to nutrition interview. States that his daughter helps with his TF at home.  Called and discussed home regimen with daughter. States that pt receives 4 bolus feeds (2 cans each) of Osmolite 1.5. Pt receives 267mL of water every 2 hours during waking hours at home. Daughter reports good tolerance at home and that pt's weight has been stable. Will continue current TF plan as ordered as it matches home regimen.   Relevant  Scheduled Meds: . bisacodyl  10 mg Rectal QHS  . famotidine  20 mg Per Tube Daily   Relevant Continuous Infusions: . sodium chloride 125 mL/hr at 02/05/21 1311  . ertapenem 1,000 mg (02/05/21 0825)  . vancomycin 500 mg (02/05/21 0901)   Relevant PRN Meds: docusate sodium, ondansetron   Labs reviewed:  Na 154  BUN 50  SBG ranges from 119-135 mg/dL over the last 24 hours  HgbA1c 6.0% (4/17)  NUTRITION - FOCUSED PHYSICAL EXAM: Flowsheet Row Most Recent Value  Orbital Region No depletion  Upper Arm Region Unable to assess  [edema]  Thoracic and Lumbar Region No depletion  Buccal Region No depletion  Temple Region No depletion  Clavicle Bone Region No depletion  Clavicle and Acromion Bone Region No depletion  Scapular Bone Region No depletion  Dorsal Hand No depletion  Patellar Region No depletion  Anterior Thigh Region No depletion  Posterior Calf Region Unable to assess  [edema]  Edema (RD Assessment) Moderate  Hair Reviewed  Eyes Reviewed  Mouth Reviewed  Skin Reviewed  Nails Reviewed     Diet Order:   Diet Order            Diet NPO time specified  Diet effective now                 EDUCATION NEEDS:  No education needs have been identified at this time  Skin:  Skin Assessment: Skin Integrity Issues: Skin Integrity Issues:: Unstageable,Stage III,Other (Comment) Stage III: sacrum Unstageable: bilateral heels per  WOC "100% necrotic" Other: right arm - skin tear, MASD to the buttocks  Last BM:  5/16, type 7 per RN documentation  Height:  Ht Readings from Last 1 Encounters:  12/28/20 6' 5.01" (1.956 m)    Weight:  Wt Readings from Last 1 Encounters:  02/04/21 110 kg    Ideal Body Weight:  94.5 kg  BMI:  Body mass index is 28.75 kg/m.  Estimated Nutritional Needs:   Kcal:  2700-2900 kcal/d  Protein:  135-150 g/d  Fluid:  >2.7 L/d   Ryan Day, RD, LDN Clinical Dietitian Pager on [place]

## 2021-02-05 NOTE — Clinical Social Work Note (Signed)
CSW acknowledges SNF consult. CSW discharged to [facility] in April. Went into Care Everywhere to try and find documentation about services he was set up with. Found note from [facility]. CSW called their representative, Ree Shay, and she confirmed patient is active with them for home hospice services.  Dayton Scrape, [place]

## 2021-02-05 NOTE — Consult Note (Signed)
[facility] Nurse wound consult note Consultation was completed by review of records, images and assistance from the bedside nurse/clinical staff.    Reason for Consult: multiple pressure injuries  Patient from home with hospice services  Wound type:  1. Sacrum; Stage 3 pressure injury;  evidence of healing larger area that is reepithelialized.  Small opening; clean base. Yellow tissue along distal aspect of wound 2. Right upper arm; skin tear vs.adhesive related skin injury; partial thickness 3. Left heel: Unstageable pressure injury; 100% necrotic with hyperkeratosis along wound perimeter 4. Right dorsal heel: Unstageable pressure injury; 100% necrotic   5. MASD _moisture associated skin damage; related to incontinence; scattered partial thickness skin loss over the bilateral buttocks, all pink and clean  Pressure Injury POA: Yes Measurement: see nursing flowsheets Wound bed:see above  Drainage (amount, consistency, odor) see nursing flowsheet; sites do not look infected  Periwound: intact with evidence of scarring from previous ulcerations Dressing procedure/placement/frequency: 1. Low air loss mattress for moisture management and pressure redistribution 2. Prevalon boots to offload heels at all times; send home with patient for use 3. Enzymatic debridement to the distal portion of the sacrum; apply daily. 4. Moisture barrier cream to the bilateral buttocks daily and PRN after episode of incontinence 5. Paint bilateral heel ulcers with betadine; allow to air dry; cover with foam or dry dressings     Re consult if needed, will not follow at this time. Thanks  Carlena Ruybal [employer], RN,CWOCN, CNS, [place] [phone])

## 2021-02-05 NOTE — Progress Notes (Addendum)
Progress Note    Ryan Day  IOE:[id number] DOB: [birth year]  DOA: 02/04/2021 PCP: Valerie Roys, DO      Brief Narrative:    Medical records reviewed and are as summarized below:  Ryan Day is a 70 y.o. male with medical history significant for quadriplegia secondary to mechanical fall at home with C3-C6 laminectomy, chronic tracheostomy, hypertension, hyperlipidemia, dysphagia on enteral nutrition via PEG tube, chronic osteomyelitis, history of bacteremia, neurogenic bladder requiring chronic Foley catheter, protein calorie malnutrition, depression, anxiety, insomnia. He was discharged from the hospital on 01/05/2021 with instructions to continue IV vancomycin and cefepime through 01/11/2021.  At that time, he was hospitalized for septic shock, MRSA bacteremia, Enterobacter cloacae and Enterococcus faecalis bacteremia and sacral wound infection.  He was recently enrolled in hospice care at home.  He was brought to the hospital because of yellowish-green drainage from PICC line site on the right arm.       Assessment/Plan:   Principal Problem:   AKI (acute kidney injury) ([place]) Active Problems:   Hypertension   Elevated alkaline phosphatase level   Cervical spinal cord injury, sequela (HCC)   Spinal cord injury, cervical region, sequela (HCC)   Pressure injury of skin   S/P percutaneous endoscopic gastrostomy (PEG) tube placement (HCC)   Tracheostomy dependent (HCC)   Quadriplegia (HCC)   Neurogenic orthostatic hypotension (HCC)   Neurogenic bowel   Decubitus ulcer of sacral region, stage 4 (HCC)    Body mass index is 28.75 kg/m.   AKI, dehydration and hypernatremia: Continue IV fluids.  Monitor BMP.  Probable PICC line infection: PICC line has been removed.  Follow-up blood cultures.  Continue empiric IV antibiotics for now.  Recent septic shock, MRSA, Enterococcus faecalis, Enterobacter cloacae bacteremia and sacral wound infection: From chart review, patient  was instructed to complete vancomycin and cefepime on 01/11/2021.  He was discharged to [place].  His wife thinks that physicians at [facility] may have advised him to continue antibiotics through 02/17/2021.  History of cervical spinal cord injury complicated by quadriplegia, dysphagia, chronic respiratory failure and urinary retention: Continue oxygen via trach collar.  Continue enteral nutrition via PEG tube.  Foley catheter in place for urinary retention.  Continue supportive care.  Multiple decubitus wounds (stage III sacral decubitus ulcer, unstageable left heel necrotic decubitus ulcer, unstageable right heel necrotic decubitus ulcer), moisture associated skin damage over bilateral buttocks: Appreciate wound care nurse's recommendation.  Continue local wound care.  Plan of care was discussed with the patient and his wife at the bedside.  Patient was on hospice at home.  However, he was still getting IV antibiotics.  I spoke to [place], with the hospice team.  She said she had only seen the patient while since he was enrolled in hospice on 01/27/2021.  She said she did not authorize any antibiotics but the only continued antibiotics that patient was already taking. Wife requested I change patient's CODE STATUS from full code to DNR as it was previously.  She said she was going to talk to her daughters and to make a decision on comfort care.     Diet Order            Diet NPO time specified  Diet effective now                    Consultants:  None  Procedures:  None    Medications:   . apixaban  2.5 mg Oral BID  .  bisacodyl  10 mg Rectal QHS  . Chlorhexidine Gluconate Cloth  6 each Topical Daily  . collagenase   Topical Daily  . famotidine  20 mg Per Tube Daily  . feeding supplement (OSMOLITE 1.5 CAL)  474 mL Per Tube QID  . feeding supplement (PROSource TF)  45 mL Per Tube TID  . FLUoxetine  40 mg Per Tube Daily  . free water  400 mL Per Tube Q2H  . guaiFENesin  20 mL Per  Tube Q6H  . ipratropium-albuterol  3 mL Nebulization BID  . nutrition supplement (JUVEN)  1 packet Per Tube BID BM  . [START ON 02/06/2021] scopolamine  1 patch Transdermal Q72H  . traZODone  50 mg Per Tube QHS   Continuous Infusions: . sodium chloride 125 mL/hr at 02/05/21 1311  . ertapenem 1,000 mg (02/05/21 0825)  . vancomycin 500 mg (02/05/21 0901)     Anti-infectives (From admission, onward)   Start     Dose/Rate Route Frequency Ordered Stop   02/05/21 0800  ertapenem [facility]) IVPB  Status:  Discontinued        1 g Intravenous Every 24 hours 02/04/21 1752 02/04/21 1812   02/05/21 0800  ertapenem [facility]) IVPB  Status:  Discontinued        1 g Intravenous Every 24 hours 02/04/21 1812 02/04/21 1812   02/05/21 0800  ertapenem (INVANZ) 1,000 mg in sodium chloride 0.9 % 100 mL IVPB        1 g 200 mL/hr over 30 Minutes Intravenous Every 24 hours 02/04/21 1813     02/04/21 2100  vancomycin (VANCOREADY) IVPB 500 mg/100 mL        500 mg 100 mL/hr over 60 Minutes Intravenous Every 12 hours 02/04/21 1811     02/04/21 1845  ertapenem [facility]) IVPB  Status:  Discontinued        1 g Intravenous Every 24 hours 02/04/21 1746 02/04/21 1752   02/04/21 1845  vancomycin IVPB  Status:  Discontinued       Note to Pharmacy: 125 ml / hour     500 mg Intravenous Every 12 hours 02/04/21 1746 02/04/21 1755   02/04/21 1445  vancomycin (VANCOREADY) IVPB 2000 mg/400 mL  Status:  Discontinued        2,000 mg 200 mL/hr over 120 Minutes Intravenous  Once 02/04/21 1435 02/04/21 1539   02/04/21 1445  ceFEPIme (MAXIPIME) 2 g in sodium chloride 0.9 % 100 mL IVPB  Status:  Discontinued        2 g 200 mL/hr over 30 Minutes Intravenous  Once 02/04/21 1435 02/04/21 1535             Family Communication/Anticipated D/C date and plan/Code Status   DVT prophylaxis: apixaban (ELIQUIS) tablet 2.5 mg Start: 02/05/21 2200 Place TED hose Start: 02/04/21 1628 apixaban (ELIQUIS) tablet 2.5 mg     Code Status:  DNR  Family Communication: His wife at the bedside Disposition Plan:    Status is: Observation  The patient will require care spanning > 2 midnights and should be moved to inpatient because: IV treatments appropriate due to intensity of illness or inability to take PO  Dispo: The patient is from: Home              Anticipated d/c is to: Home              Patient currently is not medically stable to d/c.   Difficult to place patient No  Subjective:   Interval events noted.  He has no complaints.  His wife was at the bedside.  Objective:    Vitals:   02/05/21 0623 02/05/21 0827 02/05/21 0829 02/05/21 1121  BP: (!) 141/79  120/60 111/64  Pulse: 88 84 82 83  Resp: 20  18   Temp: 97.7 F (36.5 C)  97.8 F (36.6 C) (!) 97.3 F (36.3 C)  TempSrc: Oral  Oral Oral  SpO2: 100% 100% 98% 92%  Weight:       No data found.   Intake/Output Summary (Last 24 hours) at 02/05/2021 1503 Last data filed at 02/05/2021 1413 Gross per 24 hour  Intake 1908.03 ml  Output 1400 ml  Net 508.03 ml   Filed Weights   02/04/21 1400  Weight: 110 kg    Exam:  GEN: NAD SKIN: Superficial wounds on the right arm.  EYES: No pallor or icterus ENT: MMM, tracheostomy CV: RRR PULM: CTA B ABD: soft, ND, NT, +BS, +PEG tube CNS: Alert,, oriented x3, quadriplegic EXT: Right upper extremity swelling.  No tenderness or erythema. GU: Foley catheter draining amber urine   Data Reviewed:   I have personally reviewed following labs and imaging studies:  Labs: Labs show the following:   Basic Metabolic Panel: Recent Labs  Lab 02/04/21 1450 02/05/21 0145  NA 157* 154*  K 4.0 3.8  CL 118* 120*  CO2 31 27  GLUCOSE 177* 189*  BUN 54* 50*  CREATININE 1.30* 1.14  CALCIUM 10.1 9.6   GFR Estimated Creatinine Clearance: 85.5 mL/min (by C-G formula based on SCr of 1.14 mg/dL). Liver Function Tests: Recent Labs  Lab 02/04/21 1450  AST 78*  ALT 80*  ALKPHOS 188*   BILITOT 1.0  PROT 7.8  ALBUMIN 3.0*   No results for input(s): LIPASE, AMYLASE in the last 168 hours. No results for input(s): AMMONIA in the last 168 hours. Coagulation profile Recent Labs  Lab 02/04/21 1521  INR 1.6*    CBC: Recent Labs  Lab 02/04/21 1450 02/05/21 0145  WBC 11.6* 9.2  NEUTROABS 6.4  --   HGB 11.5* 11.1*  HCT 37.3* 35.5*  MCV 92.8 92.0  PLT 227 176   Cardiac Enzymes: No results for input(s): CKTOTAL, CKMB, CKMBINDEX, TROPONINI in the last 168 hours. BNP (last 3 results) No results for input(s): PROBNP in the last 8760 hours. CBG: No results for input(s): GLUCAP in the last 168 hours. D-Dimer: No results for input(s): DDIMER in the last 72 hours. Hgb A1c: No results for input(s): HGBA1C in the last 72 hours. Lipid Profile: No results for input(s): CHOL, HDL, LDLCALC, TRIG, CHOLHDL, LDLDIRECT in the last 72 hours. Thyroid function studies: No results for input(s): TSH, T4TOTAL, T3FREE, THYROIDAB in the last 72 hours.  Invalid input(s): FREET3 Anemia work up: No results for input(s): VITAMINB12, FOLATE, FERRITIN, TIBC, IRON, RETICCTPCT in the last 72 hours. Sepsis Labs: Recent Labs  Lab 02/04/21 1450 02/04/21 1521 02/04/21 1748 02/05/21 0145  PROCALCITON  --  <0.10  --   --   WBC 11.6*  --   --  9.2  LATICACIDVEN  --  2.1* 1.5  --     Microbiology Recent Results (from the past 240 hour(s))  Resp Panel by RT-PCR (Flu A&B, Covid) Nasopharyngeal Swab     Status: None   Collection Time: 02/04/21  2:50 PM   Specimen: Nasopharyngeal Swab; Nasopharyngeal(NP) swabs in vial transport medium  Result Value Ref Range Status   SARS Coronavirus 2 by RT  PCR NEGATIVE NEGATIVE Final    Comment: (NOTE) SARS-CoV-2 target nucleic acids are NOT DETECTED.  The SARS-CoV-2 RNA is generally detectable in upper respiratory specimens during the acute phase of infection. The lowest concentration of SARS-CoV-2 viral copies this assay can detect is 138  copies/mL. A negative result does not preclude SARS-Cov-2 infection and should not be used as the sole basis for treatment or other patient management decisions. A negative result may occur with  improper specimen collection/handling, submission of specimen other than nasopharyngeal swab, presence of viral mutation(s) within the areas targeted by this assay, and inadequate number of viral copies(<138 copies/mL). A negative result must be combined with clinical observations, patient history, and epidemiological information. The expected result is Negative.  Fact Sheet for Patients:  EntrepreneurPulse.com.au  Fact Sheet for Healthcare Providers:  IncredibleEmployment.be  This test is no t yet approved or cleared by the [place] FDA and  has been authorized for detection and/or diagnosis of SARS-CoV-2 by FDA under an Emergency Use Authorization (EUA). This EUA will remain  in effect (meaning this test can be used) for the duration of the COVID-19 declaration under Section 564(b)(1) of the Act, 21 U.S.C.section 360bbb-3(b)(1), unless the authorization is terminated  or revoked sooner.       Influenza A by PCR NEGATIVE NEGATIVE Final   Influenza B by PCR NEGATIVE NEGATIVE Final    Comment: (NOTE) The Xpert Xpress SARS-CoV-2/FLU/RSV plus assay is intended as an aid in the diagnosis of influenza from Nasopharyngeal swab specimens and should not be used as a sole basis for treatment. Nasal washings and aspirates are unacceptable for Xpert Xpress SARS-CoV-2/FLU/RSV testing.  Fact Sheet for Patients: EntrepreneurPulse.com.au  Fact Sheet for Healthcare Providers: IncredibleEmployment.be  This test is not yet approved or cleared by the [place] FDA and has been authorized for detection and/or diagnosis of SARS-CoV-2 by FDA under an Emergency Use Authorization (EUA). This EUA will remain in effect (meaning  this test can be used) for the duration of the COVID-19 declaration under Section 564(b)(1) of the Act, 21 U.S.C. section 360bbb-3(b)(1), unless the authorization is terminated or revoked.  Performed at [facility] [address] [place], [place] [address]   Blood culture (routine single)     Status: None (Preliminary result)   Collection Time: 02/04/21  3:21 PM   Specimen: BLOOD  Result Value Ref Range Status   Specimen Description BLOOD  LEFT ARM  Final   Special Requests   Final    BOTTLES DRAWN AEROBIC AND ANAEROBIC Blood Culture results may not be optimal due to an excessive volume of blood received in culture bottles   Culture   Final    NO GROWTH < 24 HOURS Performed at [facility], [address] [place], [place] [address]    Report Status PENDING  Incomplete  Culture, blood (single)     Status: None (Preliminary result)   Collection Time: 02/05/21  1:45 AM   Specimen: BLOOD  Result Value Ref Range Status   Specimen Description BLOOD THUMB  Final   Special Requests   Final    BOTTLES DRAWN AEROBIC ONLY Blood Culture adequate volume   Culture   Final    NO GROWTH < 12 HOURS Performed at [facility], [address] [place], [place] [address]    Report Status PENDING  Incomplete    Procedures and diagnostic studies:  US Venous Img Upper Uni Right(DVT)  Result Date: 02/05/2021 CLINICAL DATA:  70 year old male with  history of swelling in the right upper extremity. EXAM: UPPER EXTREMITY VENOUS DOPPLER ULTRASOUND TECHNIQUE: Gray-scale sonography with graded compression, as well as color Doppler and duplex ultrasound were performed to evaluate the upper extremity deep venous system from the level of the subclavian vein and including the jugular, axillary, basilic, radial, ulnar and upper cephalic vein. Spectral Doppler was utilized to evaluate flow at rest and with distal augmentation maneuvers. COMPARISON:  12/20/2020 FINDINGS: Contralateral  Subclavian Vein: Respiratory phasicity is normal and symmetric with the symptomatic side. No evidence of thrombus. Normal compressibility. Internal Jugular Vein: No evidence of thrombus. Normal compressibility, respiratory phasicity and response to augmentation. Subclavian Vein: No evidence of thrombus. Normal compressibility, respiratory phasicity and response to augmentation. Axillary Vein: No evidence of thrombus. Normal compressibility, respiratory phasicity and response to augmentation. Cephalic Vein: No evidence of thrombus. Normal compressibility, respiratory phasicity and response to augmentation. Basilic Vein: No evidence of thrombus. Normal compressibility, respiratory phasicity and response to augmentation. Brachial Veins: No evidence of thrombus. Normal compressibility, respiratory phasicity and response to augmentation. Radial Veins: No evidence of thrombus. Normal compressibility, respiratory phasicity and response to augmentation. Ulnar Veins: No evidence of thrombus. Normal compressibility, respiratory phasicity and response to augmentation. Other Findings:  None visualized. IMPRESSION: No evidence of deep vein thrombosis in the right upper extremity. Ruthann Cancer, MD [facility] Electronically Signed   By: Ruthann Cancer MD   On: 02/05/2021 07:47   DG Chest Port 1 View  Result Date: 02/04/2021 CLINICAL DATA:  Tracheostomy in place.  Concern for infection EXAM: PORTABLE CHEST 1 VIEW COMPARISON:  Jan 21, 2021 FINDINGS: Previously noted peripherally inserted central catheter is not appreciable on this examination. Tracheostomy catheter tip is 6.0 cm above the carina. No pneumothorax. There is atelectatic change in the left base. Lungs elsewhere clear. Heart is upper normal in size with pulmonary vascularity normal. No adenopathy. Postoperative change noted in visualized cervical spine. IMPRESSION: Previously noted central catheter not  appreciable. Tracheostomy as described without pneumothorax. Atelectasis left base. Developing pneumonia superimposed on atelectasis in this area in the left base is questioned. Lungs elsewhere clear. Stable cardiac silhouette. Electronically Signed   By: Lowella Grip III M.D.   On: 02/04/2021 16:52               LOS: 0 days   Antonin Meininger  [facility]   Pager on [email or web address]. If 7PM-7AM, please contact night-coverage at [email or web address]     02/05/2021, 3:03 PM

## 2021-02-06 LAB — CBC WITH DIFFERENTIAL/PLATELET
Abs Immature Granulocytes: 0.23 10*3/uL — ABNORMAL HIGH (ref 0.00–0.07)
Basophils Absolute: 0 10*3/uL (ref 0.0–0.1)
Basophils Relative: 0 %
Eosinophils Absolute: 1.6 10*3/uL — ABNORMAL HIGH (ref 0.0–0.5)
Eosinophils Relative: 16 %
HCT: 29.7 % — ABNORMAL LOW (ref 39.0–52.0)
Hemoglobin: 9.5 g/dL — ABNORMAL LOW (ref 13.0–17.0)
Immature Granulocytes: 2 %
Lymphocytes Relative: 20 %
Lymphs Abs: 2 10*3/uL (ref 0.7–4.0)
MCH: 29.7 pg (ref 26.0–34.0)
MCHC: 32 g/dL (ref 30.0–36.0)
MCV: 92.8 fL (ref 80.0–100.0)
Monocytes Absolute: 1.1 10*3/uL — ABNORMAL HIGH (ref 0.1–1.0)
Monocytes Relative: 11 %
Neutro Abs: 5 10*3/uL (ref 1.7–7.7)
Neutrophils Relative %: 51 %
Platelets: 176 10*3/uL (ref 150–400)
RBC: 3.2 MIL/uL — ABNORMAL LOW (ref 4.22–5.81)
RDW: 16.3 % — ABNORMAL HIGH (ref 11.5–15.5)
WBC: 10.2 10*3/uL (ref 4.0–10.5)
nRBC: 0.3 % — ABNORMAL HIGH (ref 0.0–0.2)

## 2021-02-06 LAB — BASIC METABOLIC PANEL
Anion gap: 7 (ref 5–15)
BUN: 42 mg/dL — ABNORMAL HIGH (ref 8–23)
CO2: 27 mmol/L (ref 22–32)
Calcium: 9.4 mg/dL (ref 8.9–10.3)
Chloride: 118 mmol/L — ABNORMAL HIGH (ref 98–111)
Creatinine, Ser: 0.99 mg/dL (ref 0.61–1.24)
GFR, Estimated: >60 mL/min (ref >60)
Glucose, Bld: 99 mg/dL (ref 70–99)
Potassium: 5 mmol/L (ref 3.5–5.1)
Sodium: 152 mmol/L — ABNORMAL HIGH (ref 135–145)

## 2021-02-06 LAB — C DIFFICILE QUICK SCREEN W PCR REFLEX
C Diff antigen: POSITIVE — AB
C Diff toxin: NEGATIVE

## 2021-02-06 LAB — URINE CULTURE: Culture: >=100000 — AB

## 2021-02-06 LAB — CLOSTRIDIUM DIFFICILE BY PCR, REFLEXED: Toxigenic C. Difficile by PCR: POSITIVE — AB

## 2021-02-06 LAB — MRSA PCR SCREENING: MRSA by PCR: POSITIVE — AB

## 2021-02-06 MED ORDER — DEXTROSE 5 % IV SOLN: INTRAVENOUS | Status: AC

## 2021-02-06 MED ORDER — MUPIROCIN 2 % EX OINT
1.0000 "application " | TOPICAL_OINTMENT | Freq: Two times a day (BID) | CUTANEOUS | Status: AC
  Administered 2021-02-06 – 2021-02-11 (×10): 1 via NASAL
  Filled 2021-02-06: qty 22

## 2021-02-06 NOTE — Progress Notes (Signed)
Pharmacy Antibiotic Note  Ryan Day is an 70 y.o. male with medical history significant for quadriplegia status post mechanical fall at home with C3-6 laminectomy, chronic tracheostomy, hypertension, hyperlipidemia.  He was admitted on 12/07/2020 when he presented to [facility] emergency room with hematuria. Patient apparently had a Foley catheter placed 2 weeks prior to the presentation and completed treatment with 7-day course of antibiotic therapy for the UTI. He has a sacral wound and sacral wound culture showed Pseudomonas, rare MRSA, few bacteria voids, rare Candida albicans.  Infectious disease was consulted. Patient was on treatment with IV vancomycin, cefepime. He had MRI of his lumbar spine done on 01/08/2021 which per report showed findings concerning for osteomyelitis. He was on treatment with IV vancomycin, ciprofloxacin. Was to be on antibiotics with tentative end date 02/17/21 for osteomyelitis total of 6 weeks.  Pharmacy was consulted for vancomycin dosing. A vancomycin trough was drawn on 02/04/21 at 19 mcg/mL   Plan:  Will continue patient current regimen of vancomycin 500mg  IV q12h  Monitor renal function and adjust dose as clinically indicated  Re-check vancomycin levels weekly  Goal vancomycin trough 15 - 20 mcg/mL  Weight: 110 kg (242 lb 8.1 oz)  Temp (24hrs), Avg:97.8 F (36.6 C), Min:97.3 F (36.3 C), Max:98.1 F (36.7 C)  Recent Labs  Lab 02/04/21 1450 02/04/21 1521 02/04/21 1748 02/04/21 2017 02/05/21 0145 02/06/21 0459  WBC 11.6*  --   --   --  9.2 10.2  CREATININE 1.30*  --   --   --  1.14 0.99  LATICACIDVEN  --  2.1* 1.5  --   --   --   VANCOTROUGH  --   --   --  19  --   --     Estimated Creatinine Clearance: 98.5 mL/min (by C-G formula based on SCr of 0.99 mg/dL).    Allergies  Allergen Reactions  . Shrimp (Diagnostic)     Experiences facial droop when eating seafood    Antimicrobials this admission: 5/15 ertapenem 5/15 vancomyin >>  Microbiology  results: 5/15 BCx: NG x 1 day 5/15 UCx: pending 5/17 C diff Ag (+), toxin (-) 5/15 MRSA PCR: sent  Thank you for allowing pharmacy to be a part of this patient's care.  Dallie Piles, PharmD 02/06/2021 7:52 AM

## 2021-02-06 NOTE — Progress Notes (Signed)
Observed granule-like particles in PEG tube. Flushed and particles would not dislodge. Also tried to squeeze tube between fingers and particles did not loosen. PEG flushes find and doesn't appear to have any issues. Will continue to monitor.   Fuller Mandril, RN

## 2021-02-06 NOTE — Progress Notes (Signed)
Bariatric bed and air mattress has been requested from [employer].   Fuller Mandril, RN

## 2021-02-06 NOTE — Progress Notes (Signed)
Trach suctioned for mod amt of thick white secretions

## 2021-02-06 NOTE — Progress Notes (Addendum)
Progress Note    Ryan Day  [id number] DOB: 1951/04/25  DOA: 02/04/2021 PCP: Valerie Roys, DO      Brief Narrative:    Medical records reviewed and are as summarized below:  Ryan Day is a 70 y.o. male with medical history significant for quadriplegia secondary to mechanical fall at home with C3-C6 laminectomy, chronic tracheostomy, hypertension, hyperlipidemia, dysphagia on enteral nutrition via PEG tube, chronic osteomyelitis, history of bacteremia, neurogenic bladder requiring chronic Foley catheter, protein calorie malnutrition, depression, anxiety, insomnia. He was discharged from the hospital on 01/05/2021 with instructions to continue IV vancomycin and cefepime through 01/11/2021.  At that time, he was hospitalized for septic shock, MRSA bacteremia, Enterobacter cloacae and Enterococcus faecalis bacteremia and sacral wound infection.  He was recently enrolled in hospice care at home.  He was brought to the hospital because of yellowish-green drainage from PICC line site on the right arm.       Assessment/Plan:   Principal Problem:   AKI (acute kidney injury) ([place]) Active Problems:   Hypertension   Elevated alkaline phosphatase level   Cervical spinal cord injury, sequela (HCC)   Spinal cord injury, cervical region, sequela (HCC)   Pressure injury of skin   S/P percutaneous endoscopic gastrostomy (PEG) tube placement (HCC)   Tracheostomy dependent (HCC)   Quadriplegia (HCC)   Neurogenic orthostatic hypotension (HCC)   Hypernatremia   Neurogenic bowel   Decubitus ulcer of sacral region, stage 4 (HCC)    Body mass index is 28.75 kg/m.   AKI, dehydration and hypernatremia: Creatinine has improved.  Sodium level is slowly trending down.  Change IV fluids from half-normal saline to D5W.  Monitor BMP.    Probable PICC line infection, right upper extremity cellulitis: PICC line was removed on 02/04/2021.  No growth on blood cultures thus far.  Continue IV  antibiotics.   Recent septic shock, MRSA, Enterococcus faecalis, Enterobacter cloacae bacteremia and sacral wound infection: From chart review, patient was instructed to complete vancomycin and cefepime on 01/11/2021.  He was discharged to [place].  His wife thinks that physicians at [facility] may have advised him to continue antibiotics through 02/17/2021.  History of cervical spinal cord injury complicated by quadriplegia, dysphagia, chronic respiratory failure and urinary retention: Continue oxygen via trach collar.  Continue enteral nutrition via PEG tube.  Foley catheter in place for urinary retention.  Continue supportive care.  Multiple decubitus wounds (stage III sacral decubitus ulcer, unstageable left heel necrotic decubitus ulcer, unstageable right heel necrotic decubitus ulcer), moisture associated skin damage over bilateral buttocks: Continue local wound care.  Plan of care was discussed with his wife and [name], daughter, over the phone.  Discussed goals of care.  They want to continue IV fluids to correct hypernatremia.  They want to continue antibiotics through 02/07/2021.  They do not want a new PICC line to be inserted.  Disposition will be determined by how he is doing tomorrow.    Diet Order            Diet NPO time specified  Diet effective now                    Consultants:  None  Procedures:  None    Medications:   . apixaban  2.5 mg Oral BID  . bisacodyl  10 mg Rectal QHS  . Chlorhexidine Gluconate Cloth  6 each Topical Daily  . collagenase   Topical Daily  . famotidine  20  mg Per Tube Daily  . feeding supplement (OSMOLITE 1.5 CAL)  474 mL Per Tube QID  . feeding supplement (PROSource TF)  45 mL Per Tube TID  . FLUoxetine  40 mg Per Tube Daily  . free water  400 mL Per Tube Q2H  . guaiFENesin  20 mL Per Tube Q6H  . ipratropium-albuterol  3 mL Nebulization BID  . nutrition supplement (JUVEN)  1 packet Per Tube BID BM  . scopolamine  1 patch  Transdermal Q72H  . traZODone  50 mg Per Tube QHS   Continuous Infusions: . dextrose 100 mL/hr at 02/06/21 1048  . ertapenem 1,000 mg (02/06/21 1326)  . vancomycin 500 mg (02/05/21 2014)     Anti-infectives (From admission, onward)   Start     Dose/Rate Route Frequency Ordered Stop   02/05/21 0800  ertapenem [facility]) IVPB  Status:  Discontinued        1 g Intravenous Every 24 hours 02/04/21 1752 02/04/21 1812   02/05/21 0800  ertapenem [facility]) IVPB  Status:  Discontinued        1 g Intravenous Every 24 hours 02/04/21 1812 02/04/21 1812   02/05/21 0800  ertapenem (INVANZ) 1,000 mg in sodium chloride 0.9 % 100 mL IVPB        1 g 200 mL/hr over 30 Minutes Intravenous Every 24 hours 02/04/21 1813     02/04/21 2100  vancomycin (VANCOREADY) IVPB 500 mg/100 mL        500 mg 100 mL/hr over 60 Minutes Intravenous Every 12 hours 02/04/21 1811     02/04/21 1845  ertapenem [facility]) IVPB  Status:  Discontinued        1 g Intravenous Every 24 hours 02/04/21 1746 02/04/21 1752   02/04/21 1845  vancomycin IVPB  Status:  Discontinued       Note to Pharmacy: 125 ml / hour     500 mg Intravenous Every 12 hours 02/04/21 1746 02/04/21 1755   02/04/21 1445  vancomycin (VANCOREADY) IVPB 2000 mg/400 mL  Status:  Discontinued        2,000 mg 200 mL/hr over 120 Minutes Intravenous  Once 02/04/21 1435 02/04/21 1539   02/04/21 1445  ceFEPIme (MAXIPIME) 2 g in sodium chloride 0.9 % 100 mL IVPB  Status:  Discontinued        2 g 200 mL/hr over 30 Minutes Intravenous  Once 02/04/21 1435 02/04/21 1535             Family Communication/Anticipated D/C date and plan/Code Status   DVT prophylaxis: apixaban (ELIQUIS) tablet 2.5 mg Start: 02/05/21 2200 Place TED hose Start: 02/04/21 1628 apixaban (ELIQUIS) tablet 2.5 mg     Code Status: DNR  Family Communication: His wife at the bedside Disposition Plan:    Status is: Inpatient  Remains inpatient appropriate because:IV treatments appropriate due  to intensity of illness or inability to take PO and Inpatient level of care appropriate due to severity of illness   Dispo: The patient is from: Home              Anticipated d/c is to: Home              Patient currently is not medically stable to d/c.   Difficult to place patient No                     Subjective:   Interval events noted.  He complains of bilateral shoulder and neck pain which is  chronic.  Objective:    Vitals:   02/05/21 1958 02/05/21 2059 02/06/21 0520 02/06/21 1001  BP: 138/87  122/61 (!) 181/94  Pulse: 84 78 72 70  Resp: (!) 22 20 20    Temp: 97.9 F (36.6 C)  97.7 F (36.5 C) (!) 97.3 F (36.3 C)  TempSrc: Oral  Oral Oral  SpO2: 98% 97% 98% 100%  Weight:       No data found.   Intake/Output Summary (Last 24 hours) at 02/06/2021 1345 Last data filed at 02/06/2021 0500 Gross per 24 hour  Intake 799.6 ml  Output 1800 ml  Net -1000.4 ml   Filed Weights   02/04/21 1400  Weight: 110 kg    Exam:   GEN: NAD SKIN: Superficial wounds on right arm. EYES: No pallor or icterus ENT: MMM, tracheostomy CV: RRR PULM: CTA B ABD: soft, ND, NT, +BS, +PEG tube CNS: AAO x 3, non focal EXT: Right upper extremity swelling.  No tenderness or erythema. GU: Foley catheter draining amber urine     Data Reviewed:   I have personally reviewed following labs and imaging studies:  Labs: Labs show the following:   Basic Metabolic Panel: Recent Labs  Lab 02/04/21 1450 02/05/21 0145 02/06/21 0459  NA 157* 154* 152*  K 4.0 3.8 5.0  CL 118* 120* 118*  CO2 31 27 27   GLUCOSE 177* 189* 99  BUN 54* 50* 42*  CREATININE 1.30* 1.14 0.99  CALCIUM 10.1 9.6 9.4   GFR Estimated Creatinine Clearance: 98.5 mL/min (by C-G formula based on SCr of 0.99 mg/dL). Liver Function Tests: Recent Labs  Lab 02/04/21 1450  AST 78*  ALT 80*  ALKPHOS 188*  BILITOT 1.0  PROT 7.8  ALBUMIN 3.0*   No results for input(s): LIPASE, AMYLASE in the last  168 hours. No results for input(s): AMMONIA in the last 168 hours. Coagulation profile Recent Labs  Lab 02/04/21 1521  INR 1.6*    CBC: Recent Labs  Lab 02/04/21 1450 02/05/21 0145 02/06/21 0459  WBC 11.6* 9.2 10.2  NEUTROABS 6.4  --  5.0  HGB 11.5* 11.1* 9.5*  HCT 37.3* 35.5* 29.7*  MCV 92.8 92.0 92.8  PLT 227 176 176   Cardiac Enzymes: No results for input(s): CKTOTAL, CKMB, CKMBINDEX, TROPONINI in the last 168 hours. BNP (last 3 results) No results for input(s): PROBNP in the last 8760 hours. CBG: No results for input(s): GLUCAP in the last 168 hours. D-Dimer: No results for input(s): DDIMER in the last 72 hours. Hgb A1c: No results for input(s): HGBA1C in the last 72 hours. Lipid Profile: No results for input(s): CHOL, HDL, LDLCALC, TRIG, CHOLHDL, LDLDIRECT in the last 72 hours. Thyroid function studies: No results for input(s): TSH, T4TOTAL, T3FREE, THYROIDAB in the last 72 hours.  Invalid input(s): FREET3 Anemia work up: No results for input(s): VITAMINB12, FOLATE, FERRITIN, TIBC, IRON, RETICCTPCT in the last 72 hours. Sepsis Labs: Recent Labs  Lab 02/04/21 1450 02/04/21 1521 02/04/21 1748 02/05/21 0145 02/06/21 0459  PROCALCITON  --  <0.10  --   --   --   WBC 11.6*  --   --  9.2 10.2  LATICACIDVEN  --  2.1* 1.5  --   --     Microbiology Recent Results (from the past 240 hour(s))  Resp Panel by RT-PCR (Flu A&B, Covid) Nasopharyngeal Swab     Status: None   Collection Time: 02/04/21  2:50 PM   Specimen: Nasopharyngeal Swab; Nasopharyngeal(NP) swabs in vial transport medium  Result Value Ref Range Status   SARS Coronavirus 2 by RT PCR NEGATIVE NEGATIVE Final    Comment: (NOTE) SARS-CoV-2 target nucleic acids are NOT DETECTED.  The SARS-CoV-2 RNA is generally detectable in upper respiratory specimens during the acute phase of infection. The lowest concentration of SARS-CoV-2 viral copies this assay can detect is 138 copies/mL. A negative result  does not preclude SARS-Cov-2 infection and should not be used as the sole basis for treatment or other patient management decisions. A negative result may occur with  improper specimen collection/handling, submission of specimen other than nasopharyngeal swab, presence of viral mutation(s) within the areas targeted by this assay, and inadequate number of viral copies(<138 copies/mL). A negative result must be combined with clinical observations, patient history, and epidemiological information. The expected result is Negative.  Fact Sheet for Patients:  EntrepreneurPulse.com.au  Fact Sheet for Healthcare Providers:  IncredibleEmployment.be  This test is no t yet approved or cleared by the [place] FDA and  has been authorized for detection and/or diagnosis of SARS-CoV-2 by FDA under an Emergency Use Authorization (EUA). This EUA will remain  in effect (meaning this test can be used) for the duration of the COVID-19 declaration under Section 564(b)(1) of the Act, 21 U.S.C.section 360bbb-3(b)(1), unless the authorization is terminated  or revoked sooner.       Influenza A by PCR NEGATIVE NEGATIVE Final   Influenza B by PCR NEGATIVE NEGATIVE Final    Comment: (NOTE) The Xpert Xpress SARS-CoV-2/FLU/RSV plus assay is intended as an aid in the diagnosis of influenza from Nasopharyngeal swab specimens and should not be used as a sole basis for treatment. Nasal washings and aspirates are unacceptable for Xpert Xpress SARS-CoV-2/FLU/RSV testing.  Fact Sheet for Patients: EntrepreneurPulse.com.au  Fact Sheet for Healthcare Providers: IncredibleEmployment.be  This test is not yet approved or cleared by the [place] FDA and has been authorized for detection and/or diagnosis of SARS-CoV-2 by FDA under an Emergency Use Authorization (EUA). This EUA will remain in effect (meaning this test can be used) for  the duration of the COVID-19 declaration under Section 564(b)(1) of the Act, 21 U.S.C. section 360bbb-3(b)(1), unless the authorization is terminated or revoked.  Performed at [facility], [address], [place], [place] [address]   Urine Culture     Status: None (Preliminary result)   Collection Time: 02/04/21  3:21 PM   Specimen: Urine, Random  Result Value Ref Range Status   Specimen Description   Final    URINE, RANDOM Performed at [facility], [address] [place], [place] [address]    Special Requests   Final    NONE Performed at [facility], [address] [place], [place] [address]    Culture   Final    CULTURE REINCUBATED FOR BETTER GROWTH Performed at [facility], [address], [place], [place] [address]    Report Status PENDING  Incomplete  Blood culture (routine single)     Status: None (Preliminary result)   Collection Time: 02/04/21  3:21 PM   Specimen: BLOOD  Result Value Ref Range Status   Specimen Description BLOOD  LEFT ARM  Final   Special Requests   Final    BOTTLES DRAWN AEROBIC AND ANAEROBIC Blood Culture results may not be optimal due to an excessive volume of blood received in culture bottles   Culture   Final    NO GROWTH < 24 HOURS Performed at [facility], [address] [place], [place] [address]    Report Status PENDING  Incomplete  Culture, blood (single)     Status: None (Preliminary result)   Collection Time: 02/05/21  1:45 AM   Specimen: BLOOD  Result Value Ref Range Status   Specimen Description BLOOD THUMB  Final   Special Requests   Final    BOTTLES DRAWN AEROBIC ONLY Blood Culture adequate volume   Culture   Final    NO GROWTH < 12 HOURS Performed at [facility], [address] [place], [place] [address]    Report Status PENDING  Incomplete  C Difficile Quick Screen w PCR reflex     Status: Abnormal   Collection Time: 02/06/21  6:10 AM   Specimen: STOOL   Result Value Ref Range Status   C Diff antigen POSITIVE (A) NEGATIVE Final   C Diff toxin NEGATIVE NEGATIVE Final   C Diff interpretation Results are indeterminate. See PCR results.  Final    Comment: Performed at [facility], [address] [place], [place] [address]    Procedures and diagnostic studies:  US Venous Img Upper Uni Right(DVT)  Result Date: 02/05/2021 CLINICAL DATA:  70 year old male with history of swelling in the right upper extremity. EXAM: UPPER EXTREMITY VENOUS DOPPLER ULTRASOUND TECHNIQUE: Gray-scale sonography with graded compression, as well as color Doppler and duplex ultrasound were performed to evaluate the upper extremity deep venous system from the level of the subclavian vein and including the jugular, axillary, basilic, radial, ulnar and upper cephalic vein. Spectral Doppler was utilized to evaluate flow at rest and with distal augmentation maneuvers. COMPARISON:  12/20/2020 FINDINGS: Contralateral Subclavian Vein: Respiratory phasicity is normal and symmetric with the symptomatic side. No evidence of thrombus. Normal compressibility. Internal Jugular Vein: No evidence of thrombus. Normal compressibility, respiratory phasicity and response to augmentation. Subclavian Vein: No evidence of thrombus. Normal compressibility, respiratory phasicity and response to augmentation. Axillary Vein: No evidence of thrombus. Normal compressibility, respiratory phasicity and response to augmentation. Cephalic Vein: No evidence of thrombus. Normal compressibility, respiratory phasicity and response to augmentation. Basilic Vein: No evidence of thrombus. Normal compressibility, respiratory phasicity and response to augmentation. Brachial Veins: No evidence of thrombus. Normal compressibility, respiratory phasicity and response to augmentation. Radial Veins: No evidence of thrombus. Normal compressibility, respiratory phasicity and response to augmentation. Ulnar Veins: No  evidence of thrombus. Normal compressibility, respiratory phasicity and response to augmentation. Other Findings:  None visualized. IMPRESSION: No evidence of deep vein thrombosis in the right upper extremity. Ruthann Cancer, MD [facility] Electronically Signed   By: Ruthann Cancer MD   On: 02/05/2021 07:47   DG Chest Port 1 View  Result Date: 02/04/2021 CLINICAL DATA:  Tracheostomy in place.  Concern for infection EXAM: PORTABLE CHEST 1 VIEW COMPARISON:  Jan 21, 2021 FINDINGS: Previously noted peripherally inserted central catheter is not appreciable on this examination. Tracheostomy catheter tip is 6.0 cm above the carina. No pneumothorax. There is atelectatic change in the left base. Lungs elsewhere clear. Heart is upper normal in size with pulmonary vascularity normal. No adenopathy. Postoperative change noted in visualized cervical spine. IMPRESSION: Previously noted central catheter not appreciable. Tracheostomy as described without pneumothorax. Atelectasis left base. Developing pneumonia superimposed on atelectasis in this area in the left base is questioned. Lungs elsewhere clear. Stable cardiac silhouette. Electronically Signed   By: Lowella Grip III M.D.   On: 02/04/2021 16:52  LOS: 1 day   Vinal Rosengrant  [facility]   Pager on [email or web address]. If 7PM-7AM, please contact night-coverage at [email or web address]     02/06/2021, 1:45 PM

## 2021-02-07 ENCOUNTER — Telehealth: Payer: Self-pay

## 2021-02-07 DIAGNOSIS — S14109S Unspecified injury at unspecified level of cervical spinal cord, sequela: Secondary | ICD-10-CM

## 2021-02-07 DIAGNOSIS — F3289 Other specified depressive episodes: Secondary | ICD-10-CM

## 2021-02-07 DIAGNOSIS — L89153 Pressure ulcer of sacral region, stage 3: Secondary | ICD-10-CM

## 2021-02-07 LAB — CBC WITH DIFFERENTIAL/PLATELET
Abs Immature Granulocytes: 0.11 10*3/uL — ABNORMAL HIGH (ref 0.00–0.07)
Basophils Absolute: 0 10*3/uL (ref 0.0–0.1)
Basophils Relative: 0 %
Eosinophils Absolute: 1.3 10*3/uL — ABNORMAL HIGH (ref 0.0–0.5)
Eosinophils Relative: 15 %
HCT: 26.3 % — ABNORMAL LOW (ref 39.0–52.0)
Hemoglobin: 8.5 g/dL — ABNORMAL LOW (ref 13.0–17.0)
Immature Granulocytes: 1 %
Lymphocytes Relative: 22 %
Lymphs Abs: 1.8 10*3/uL (ref 0.7–4.0)
MCH: 29.5 pg (ref 26.0–34.0)
MCHC: 32.3 g/dL (ref 30.0–36.0)
MCV: 91.3 fL (ref 80.0–100.0)
Monocytes Absolute: 0.9 10*3/uL (ref 0.1–1.0)
Monocytes Relative: 11 %
Neutro Abs: 4.2 10*3/uL (ref 1.7–7.7)
Neutrophils Relative %: 51 %
Platelets: 164 10*3/uL (ref 150–400)
RBC: 2.88 MIL/uL — ABNORMAL LOW (ref 4.22–5.81)
RDW: 16.4 % — ABNORMAL HIGH (ref 11.5–15.5)
WBC: 8.2 10*3/uL (ref 4.0–10.5)
nRBC: 0.2 % (ref 0.0–0.2)

## 2021-02-07 LAB — BASIC METABOLIC PANEL
Anion gap: 7 (ref 5–15)
BUN: 30 mg/dL — ABNORMAL HIGH (ref 8–23)
CO2: 25 mmol/L (ref 22–32)
Calcium: 8.9 mg/dL (ref 8.9–10.3)
Chloride: 110 mmol/L (ref 98–111)
Creatinine, Ser: 0.88 mg/dL (ref 0.61–1.24)
GFR, Estimated: >60 mL/min (ref >60)
Glucose, Bld: 190 mg/dL — ABNORMAL HIGH (ref 70–99)
Potassium: 3.9 mmol/L (ref 3.5–5.1)
Sodium: 142 mmol/L (ref 135–145)

## 2021-02-07 MED ORDER — VANCOMYCIN HCL 500 MG/100ML IV SOLN
500.0000 mg | Freq: Two times a day (BID) | INTRAVENOUS | Status: DC
  Administered 2021-02-07 – 2021-02-08 (×3): 500 mg via INTRAVENOUS
  Filled 2021-02-07 (×6): qty 100

## 2021-02-07 MED ORDER — APIXABAN 2.5 MG PO TABS
2.5000 mg | ORAL_TABLET | Freq: Two times a day (BID) | ORAL | Status: DC
  Administered 2021-02-07 – 2021-02-12 (×10): 2.5 mg
  Filled 2021-02-07 (×10): qty 1

## 2021-02-07 NOTE — Progress Notes (Signed)
PROGRESS NOTE    Ryan Day  GMW:[id number] DOB: June 07, 1951 DOA: 02/04/2021 PCP: Valerie Roys, DO   Assessment & Plan:   Principal Problem:   AKI (acute kidney injury) ([place]) Active Problems:   Hypertension   Elevated alkaline phosphatase level   Cervical spinal cord injury, sequela (HCC)   Spinal cord injury, cervical region, sequela (HCC)   Pressure injury of skin   S/P percutaneous endoscopic gastrostomy (PEG) tube placement (HCC)   Tracheostomy dependent (HCC)   Quadriplegia (HCC)   Neurogenic orthostatic hypotension (HCC)   Hypernatremia   Neurogenic bowel   Decubitus ulcer of sacral region, stage 3 ([place])    Probable PICC line infection: w/ right upper extremity cellulitis. PICC line was removed on 02/04/2021.  Blood cxs NGTD.  Continue on ertapenem   AKI: resolved  Hypernatremia: resolved   Hx of osteomyelitis of sacrum/coccyx: continue on IV ertrapenem through 02/17/2021.  History of cervical spinal cord injury: complicated by quadriplegia, dysphagia, chronic respiratory failure and urinary retention. Continue oxygen via trach collar.  Continue enteral nutrition via PEG tube.  Foley catheter in place for urinary retention.  Continue supportive care.  Unstageable left heel necrotic decubitus ulcer, unstageable right heel necrotic decubitus ulcer: continue w/ wound care   Depression: severity unknown. Continue on home dose fluoxetine   Insomnia: continue on home dose of trazodone  Right upper extremity swelling: [place] was neg for DVT of RUE  DVT prophylaxis: eliquis  Code Status: DNR Family Communication: called pt's wife, [name], but no answer so I left a message  Disposition Plan: depends on pt's family decide   Level of care: Med-Surg   Status is: Inpatient  Remains inpatient appropriate because:Unsafe d/c plan, IV treatments appropriate due to intensity of illness or inability to take PO and Inpatient level of care appropriate due to severity of  illness   Dispo: The patient is from: Home              Anticipated d/c is to: Home              Patient currently is not medically stable to d/c.   Difficult to place patient Yes        Consultants:      Procedures:    Antimicrobials: ertapenem    Subjective: Pt c/o neck and shoulder pain   Objective: Vitals:   02/06/21 1001 02/06/21 2029 02/06/21 2105 02/07/21 0628  BP: (!) 181/94  (!) 149/67 (!) 133/44  Pulse: 70  60 60  Resp: 14  18 20   Temp: (!) 97.3 F (36.3 C)  97.7 F (36.5 C) 99 F (37.2 C)  TempSrc: Oral  Oral Oral  SpO2: 100% 99% 100% 100%  Weight:        Intake/Output Summary (Last 24 hours) at 02/07/2021 0747 Last data filed at 02/07/2021 0731 Gross per 24 hour  Intake 1833.73 ml  Output 875 ml  Net 958.73 ml   Filed Weights   02/04/21 1400  Weight: 110 kg    Examination:  General exam: Appears calm and comfortable  Respiratory system: diminished breath sounds b/l  Cardiovascular system: S1 & S2 +. No  rubs, gallops or clicks.  Gastrointestinal system: Abdomen is nondistended, soft and nontender.  Normal bowel sounds heard. Central nervous system: Alert and oriented. Psychiatry: Judgement and insight appear normal. Flat mood and affect.     Data Reviewed: I have personally reviewed following labs and imaging studies  CBC: Recent Labs  Lab 02/04/21 1450 02/05/21  0145 02/06/21 0459 02/07/21 0552  WBC 11.6* 9.2 10.2 8.2  NEUTROABS 6.4  --  5.0 4.2  HGB 11.5* 11.1* 9.5* 8.5*  HCT 37.3* 35.5* 29.7* 26.3*  MCV 92.8 92.0 92.8 91.3  PLT 227 176 176 008   Basic Metabolic Panel: Recent Labs  Lab 02/04/21 1450 02/05/21 0145 02/06/21 0459 02/07/21 0552  NA 157* 154* 152* 142  K 4.0 3.8 5.0 3.9  CL 118* 120* 118* 110  CO2 31 27 27 25   GLUCOSE 177* 189* 99 190*  BUN 54* 50* 42* 30*  CREATININE 1.30* 1.14 0.99 0.88  CALCIUM 10.1 9.6 9.4 8.9   GFR: Estimated Creatinine Clearance: 110.8 mL/min (by C-G formula based on SCr of  0.88 mg/dL). Liver Function Tests: Recent Labs  Lab 02/04/21 1450  AST 78*  ALT 80*  ALKPHOS 188*  BILITOT 1.0  PROT 7.8  ALBUMIN 3.0*   No results for input(s): LIPASE, AMYLASE in the last 168 hours. No results for input(s): AMMONIA in the last 168 hours. Coagulation Profile: Recent Labs  Lab 02/04/21 1521  INR 1.6*   Cardiac Enzymes: No results for input(s): CKTOTAL, CKMB, CKMBINDEX, TROPONINI in the last 168 hours. BNP (last 3 results) No results for input(s): PROBNP in the last 8760 hours. HbA1C: No results for input(s): HGBA1C in the last 72 hours. CBG: No results for input(s): GLUCAP in the last 168 hours. Lipid Profile: No results for input(s): CHOL, HDL, LDLCALC, TRIG, CHOLHDL, LDLDIRECT in the last 72 hours. Thyroid Function Tests: No results for input(s): TSH, T4TOTAL, FREET4, T3FREE, THYROIDAB in the last 72 hours. Anemia Panel: No results for input(s): VITAMINB12, FOLATE, FERRITIN, TIBC, IRON, RETICCTPCT in the last 72 hours. Sepsis Labs: Recent Labs  Lab 02/04/21 1521 02/04/21 1748  PROCALCITON <0.10  --   LATICACIDVEN 2.1* 1.5    Recent Results (from the past 240 hour(s))  Resp Panel by RT-PCR (Flu A&B, Covid) Nasopharyngeal Swab     Status: None   Collection Time: 02/04/21  2:50 PM   Specimen: Nasopharyngeal Swab; Nasopharyngeal(NP) swabs in vial transport medium  Result Value Ref Range Status   SARS Coronavirus 2 by RT PCR NEGATIVE NEGATIVE Final    Comment: (NOTE) SARS-CoV-2 target nucleic acids are NOT DETECTED.  The SARS-CoV-2 RNA is generally detectable in upper respiratory specimens during the acute phase of infection. The lowest concentration of SARS-CoV-2 viral copies this assay can detect is 138 copies/mL. A negative result does not preclude SARS-Cov-2 infection and should not be used as the sole basis for treatment or other patient management decisions. A negative result may occur with  improper specimen collection/handling,  submission of specimen other than nasopharyngeal swab, presence of viral mutation(s) within the areas targeted by this assay, and inadequate number of viral copies(<138 copies/mL). A negative result must be combined with clinical observations, patient history, and epidemiological information. The expected result is Negative.  Fact Sheet for Patients:  EntrepreneurPulse.com.au  Fact Sheet for Healthcare Providers:  IncredibleEmployment.be  This test is no t yet approved or cleared by the [place] FDA and  has been authorized for detection and/or diagnosis of SARS-CoV-2 by FDA under an Emergency Use Authorization (EUA). This EUA will remain  in effect (meaning this test can be used) for the duration of the COVID-19 declaration under Section 564(b)(1) of the Act, 21 U.S.C.section 360bbb-3(b)(1), unless the authorization is terminated  or revoked sooner.       Influenza A by PCR NEGATIVE NEGATIVE Final   Influenza B by  PCR NEGATIVE NEGATIVE Final    Comment: (NOTE) The Xpert Xpress SARS-CoV-2/FLU/RSV plus assay is intended as an aid in the diagnosis of influenza from Nasopharyngeal swab specimens and should not be used as a sole basis for treatment. Nasal washings and aspirates are unacceptable for Xpert Xpress SARS-CoV-2/FLU/RSV testing.  Fact Sheet for Patients: EntrepreneurPulse.com.au  Fact Sheet for Healthcare Providers: IncredibleEmployment.be  This test is not yet approved or cleared by the [place] FDA and has been authorized for detection and/or diagnosis of SARS-CoV-2 by FDA under an Emergency Use Authorization (EUA). This EUA will remain in effect (meaning this test can be used) for the duration of the COVID-19 declaration under Section 564(b)(1) of the Act, 21 U.S.C. section 360bbb-3(b)(1), unless the authorization is terminated or revoked.  Performed at [facility], [address] [place], [place] [address]   Urine Culture     Status: Abnormal   Collection Time: 02/04/21  3:21 PM   Specimen: Urine, Random  Result Value Ref Range Status   Specimen Description   Final    URINE, RANDOM Performed at [facility], [address], [place], [place] [address]    Special Requests   Final    NONE Performed at [facility], [address] [place], [place] [address]    Culture >=100,000 COLONIES/mL YEAST (A)  Final   Report Status 02/06/2021 FINAL  Final  Blood culture (routine single)     Status: None (Preliminary result)   Collection Time: 02/04/21  3:21 PM   Specimen: BLOOD  Result Value Ref Range Status   Specimen Description BLOOD  LEFT ARM  Final   Special Requests   Final    BOTTLES DRAWN AEROBIC AND ANAEROBIC Blood Culture results may not be optimal due to an excessive volume of blood received in culture bottles   Culture   Final    NO GROWTH 2 DAYS Performed at [facility], [address] [place], [place] [address]    Report Status PENDING  Incomplete  Culture, blood (single)     Status: None (Preliminary result)   Collection Time: 02/05/21  1:45 AM   Specimen: BLOOD  Result Value Ref Range Status   Specimen Description BLOOD THUMB  Final   Special Requests   Final    BOTTLES DRAWN AEROBIC ONLY Blood Culture adequate volume   Culture   Final    NO GROWTH 1 DAY Performed at [facility], [address], [place], [place] [address]    Report Status PENDING  Incomplete  C Difficile Quick Screen w PCR reflex     Status: Abnormal   Collection Time: 02/06/21  6:10 AM   Specimen: STOOL  Result Value Ref Range Status   C Diff antigen POSITIVE (A) NEGATIVE Final   C Diff toxin NEGATIVE NEGATIVE Final   C Diff interpretation Results are indeterminate. See PCR results.  Final    Comment: Performed at [facility], [address] [place], [place] [address]  C. Diff by PCR, Reflexed     Status:  Abnormal   Collection Time: 02/06/21  6:10 AM  Result Value Ref Range Status   Toxigenic C. Difficile by PCR POSITIVE (A) NEGATIVE Final    Comment: Positive for toxigenic C. difficile with little to no toxin production. Only treat if clinical presentation suggests symptomatic illness. Performed at [facility] [address], [place], [place] [address]   MRSA PCR Screening     Status: Abnormal  Collection Time: 02/06/21  1:29 PM   Specimen: Nasopharyngeal  Result Value Ref Range Status   MRSA by PCR POSITIVE (A) NEGATIVE Final    Comment:        The GeneXpert MRSA Assay (FDA approved for NASAL specimens only), is one component of a comprehensive MRSA colonization surveillance program. It is not intended to diagnose MRSA infection nor to guide or monitor treatment for MRSA infections. CRITICAL RESULT CALLED TO, READ BACK BY AND VERIFIED WITH: BURNS,RN @1539  ON 05.17.22.SH Performed at [facility], [address] [place], [place] [address]          Radiology Studies: No results found.      Scheduled Meds: . apixaban  2.5 mg Oral BID  . bisacodyl  10 mg Rectal QHS  . Chlorhexidine Gluconate Cloth  6 each Topical Daily  . collagenase   Topical Daily  . famotidine  20 mg Per Tube Daily  . feeding supplement (OSMOLITE 1.5 CAL)  474 mL Per Tube QID  . feeding supplement (PROSource TF)  45 mL Per Tube TID  . FLUoxetine  40 mg Per Tube Daily  . free water  400 mL Per Tube Q2H  . guaiFENesin  20 mL Per Tube Q6H  . ipratropium-albuterol  3 mL Nebulization BID  . mupirocin ointment  1 application Nasal BID  . nutrition supplement (JUVEN)  1 packet Per Tube BID BM  . scopolamine  1 patch Transdermal Q72H  . traZODone  50 mg Per Tube QHS   Continuous Infusions: . dextrose 100 mL/hr at 02/07/21 0731  . ertapenem Stopped (02/06/21 1356)  . vancomycin Stopped (02/07/21 0522)     LOS: 2 days    Time spent: 30 mins     Wyvonnia Dusky,  MD [facility] Pager [phone]  If 7PM-7AM, please contact night-coverage 02/07/2021, 7:47 AM

## 2021-02-08 DIAGNOSIS — E870 Hyperosmolality and hypernatremia: Principal | ICD-10-CM

## 2021-02-08 LAB — COMPREHENSIVE METABOLIC PANEL
ALT: 43 U/L (ref 0–44)
AST: 32 U/L (ref 15–41)
Albumin: 2.6 g/dL — ABNORMAL LOW (ref 3.5–5.0)
Alkaline Phosphatase: 157 U/L — ABNORMAL HIGH (ref 38–126)
Anion gap: 6 (ref 5–15)
BUN: 31 mg/dL — ABNORMAL HIGH (ref 8–23)
CO2: 25 mmol/L (ref 22–32)
Calcium: 9.6 mg/dL (ref 8.9–10.3)
Chloride: 117 mmol/L — ABNORMAL HIGH (ref 98–111)
Creatinine, Ser: 0.86 mg/dL (ref 0.61–1.24)
GFR, Estimated: >60 mL/min (ref >60)
Glucose, Bld: 102 mg/dL — ABNORMAL HIGH (ref 70–99)
Potassium: 5.1 mmol/L (ref 3.5–5.1)
Sodium: 148 mmol/L — ABNORMAL HIGH (ref 135–145)
Total Bilirubin: 0.7 mg/dL (ref 0.3–1.2)
Total Protein: 6.8 g/dL (ref 6.5–8.1)

## 2021-02-08 LAB — CBC
HCT: 29.7 % — ABNORMAL LOW (ref 39.0–52.0)
Hemoglobin: 9.8 g/dL — ABNORMAL LOW (ref 13.0–17.0)
MCH: 29.1 pg (ref 26.0–34.0)
MCHC: 33 g/dL (ref 30.0–36.0)
MCV: 88.1 fL (ref 80.0–100.0)
Platelets: 189 10*3/uL (ref 150–400)
RBC: 3.37 MIL/uL — ABNORMAL LOW (ref 4.22–5.81)
RDW: 15.9 % — ABNORMAL HIGH (ref 11.5–15.5)
WBC: 9.1 10*3/uL (ref 4.0–10.5)
nRBC: 0 % (ref 0.0–0.2)

## 2021-02-08 LAB — GLUCOSE, CAPILLARY
Glucose-Capillary: 82 mg/dL (ref 70–99)
Glucose-Capillary: 141 mg/dL — ABNORMAL HIGH (ref 70–99)

## 2021-02-08 MED ORDER — BACLOFEN 10 MG PO TABS
10.0000 mg | ORAL_TABLET | Freq: Four times a day (QID) | ORAL | Status: DC
  Administered 2021-02-08 – 2021-02-09 (×4): 10 mg via ORAL
  Filled 2021-02-08 (×7): qty 1

## 2021-02-08 MED ORDER — LORAZEPAM 2 MG/ML IJ SOLN
0.5000 mg | Freq: Three times a day (TID) | INTRAMUSCULAR | Status: DC | PRN
  Administered 2021-02-08: 0.5 mg via INTRAVENOUS
  Filled 2021-02-08: qty 1

## 2021-02-08 MED ORDER — IPRATROPIUM-ALBUTEROL 0.5-2.5 (3) MG/3ML IN SOLN: 3.0000 mL | Freq: Four times a day (QID) | RESPIRATORY_TRACT | Status: DC | PRN

## 2021-02-08 NOTE — Progress Notes (Signed)
PROGRESS NOTE    Ryan Day  [id number] DOB: 10/19/1950 DOA: 02/04/2021 PCP: Valerie Roys, DO   Assessment & Plan:   Principal Problem:   AKI (acute kidney injury) ([place]) Active Problems:   Hypertension   Elevated alkaline phosphatase level   Cervical spinal cord injury, sequela (HCC)   Spinal cord injury, cervical region, sequela (HCC)   Pressure injury of skin   S/P percutaneous endoscopic gastrostomy (PEG) tube placement (HCC)   Tracheostomy dependent (HCC)   Quadriplegia (HCC)   Neurogenic orthostatic hypotension (HCC)   Hypernatremia   Neurogenic bowel   Decubitus ulcer of sacral region, stage 3 ([place])    Probable PICC line infection: w/ right upper extremity cellulitis. PICC line was removed on 02/04/2021.  Blood cxs NGTD.  Continue on ertapenem. PICC line will not be placed again as per pt's family request    AKI: resolved  Hypernatremia: labile, continue w/ free water flushes   Hx of osteomyelitis of sacrum/coccyx: continue on IV ertrapenem through 02/17/2021.  History of cervical spinal cord injury: complicated by quadriplegia, dysphagia, chronic respiratory failure and urinary retention. Continue oxygen via trach collar.  Continue enteral nutrition via PEG tube.  Foley catheter in place for urinary retention.  Continue supportive care.  Unstageable left heel necrotic decubitus ulcer, unstageable right heel necrotic decubitus ulcer: continue w/ wound care   Depression: severity unknown. Continue on home dose fluoxetine  Insomnia: continue on home dose of trazadone   Right upper extremity swelling: [place] was neg for DVT of RUE  DVT prophylaxis: eliquis  Code Status: DNR Family Communication: discussed w/ pt's care w/ pt's family at bedside and answered their questions  Disposition Plan: waiting on hospital bed w/ extender for pt to be d/c home to continue hospice at home but will continue treatment while still in the hospital   Level of care: Med-Surg    Status is: Inpatient  Remains inpatient appropriate because:Unsafe d/c plan, IV treatments appropriate due to intensity of illness or inability to take PO and Inpatient level of care appropriate due to severity of illness   Dispo: The patient is from: Home              Anticipated d/c is to: Home w/ hospice once all supplies are delivered to the pt's home              Patient currently is not medically stable to d/c.   Difficult to place patient Yes        Consultants:      Procedures:    Antimicrobials: ertapenem    Subjective: Pt c/o back pain   Objective: Vitals:   02/07/21 1115 02/07/21 2003 02/07/21 2156 02/08/21 0534  BP: 139/78  (!) 150/86 (!) 144/71  Pulse: 77 81 75 73  Resp: 18 20 16 18   Temp: 97.6 F (36.4 C)   97.6 F (36.4 C)  TempSrc: Oral     SpO2: 100% 98% 100% 100%  Weight:        Intake/Output Summary (Last 24 hours) at 02/08/2021 0731 Last data filed at 02/08/2021 0602 Gross per 24 hour  Intake 110.06 ml  Output 1001 ml  Net -890.94 ml   Filed Weights   02/04/21 1400  Weight: 110 kg    Examination:  General exam: Appears uncomfortable  Respiratory system: decreased breath sounds b/l  Cardiovascular system: S1/S2+. No rubs or clicks  Gastrointestinal system: Abd is soft, NT, ND & hyperactive bowel sounds  Central nervous system: Alert  and oriented. Psychiatry: judgement and insight appear normal. Flat mood and affect     Data Reviewed: I have personally reviewed following labs and imaging studies  CBC: Recent Labs  Lab 02/04/21 1450 02/05/21 0145 02/06/21 0459 02/07/21 0552 02/08/21 0439  WBC 11.6* 9.2 10.2 8.2 9.1  NEUTROABS 6.4  --  5.0 4.2  --   HGB 11.5* 11.1* 9.5* 8.5* 9.8*  HCT 37.3* 35.5* 29.7* 26.3* 29.7*  MCV 92.8 92.0 92.8 91.3 88.1  PLT 227 176 176 164 937   Basic Metabolic Panel: Recent Labs  Lab 02/04/21 1450 02/05/21 0145 02/06/21 0459 02/07/21 0552 02/08/21 0439  NA 157* 154* 152* 142 148*   K 4.0 3.8 5.0 3.9 5.1  CL 118* 120* 118* 110 117*  CO2 31 27 27 25 25   GLUCOSE 177* 189* 99 190* 102*  BUN 54* 50* 42* 30* 31*  CREATININE 1.30* 1.14 0.99 0.88 0.86  CALCIUM 10.1 9.6 9.4 8.9 9.6   GFR: Estimated Creatinine Clearance: 113.4 mL/min (by C-G formula based on SCr of 0.86 mg/dL). Liver Function Tests: Recent Labs  Lab 02/04/21 1450 02/08/21 0439  AST 78* 32  ALT 80* 43  ALKPHOS 188* 157*  BILITOT 1.0 0.7  PROT 7.8 6.8  ALBUMIN 3.0* 2.6*   No results for input(s): LIPASE, AMYLASE in the last 168 hours. No results for input(s): AMMONIA in the last 168 hours. Coagulation Profile: Recent Labs  Lab 02/04/21 1521  INR 1.6*   Cardiac Enzymes: No results for input(s): CKTOTAL, CKMB, CKMBINDEX, TROPONINI in the last 168 hours. BNP (last 3 results) No results for input(s): PROBNP in the last 8760 hours. HbA1C: No results for input(s): HGBA1C in the last 72 hours. CBG: No results for input(s): GLUCAP in the last 168 hours. Lipid Profile: No results for input(s): CHOL, HDL, LDLCALC, TRIG, CHOLHDL, LDLDIRECT in the last 72 hours. Thyroid Function Tests: No results for input(s): TSH, T4TOTAL, FREET4, T3FREE, THYROIDAB in the last 72 hours. Anemia Panel: No results for input(s): VITAMINB12, FOLATE, FERRITIN, TIBC, IRON, RETICCTPCT in the last 72 hours. Sepsis Labs: Recent Labs  Lab 02/04/21 1521 02/04/21 1748  PROCALCITON <0.10  --   LATICACIDVEN 2.1* 1.5    Recent Results (from the past 240 hour(s))  Resp Panel by RT-PCR (Flu A&B, Covid) Nasopharyngeal Swab     Status: None   Collection Time: 02/04/21  2:50 PM   Specimen: Nasopharyngeal Swab; Nasopharyngeal(NP) swabs in vial transport medium  Result Value Ref Range Status   SARS Coronavirus 2 by RT PCR NEGATIVE NEGATIVE Final    Comment: (NOTE) SARS-CoV-2 target nucleic acids are NOT DETECTED.  The SARS-CoV-2 RNA is generally detectable in upper respiratory specimens during the acute phase of infection.  The lowest concentration of SARS-CoV-2 viral copies this assay can detect is 138 copies/mL. A negative result does not preclude SARS-Cov-2 infection and should not be used as the sole basis for treatment or other patient management decisions. A negative result may occur with  improper specimen collection/handling, submission of specimen other than nasopharyngeal swab, presence of viral mutation(s) within the areas targeted by this assay, and inadequate number of viral copies(<138 copies/mL). A negative result must be combined with clinical observations, patient history, and epidemiological information. The expected result is Negative.  Fact Sheet for Patients:  EntrepreneurPulse.com.au  Fact Sheet for Healthcare Providers:  IncredibleEmployment.be  This test is no t yet approved or cleared by the [place] FDA and  has been authorized for detection and/or diagnosis of SARS-CoV-2  by FDA under an Emergency Use Authorization (EUA). This EUA will remain  in effect (meaning this test can be used) for the duration of the COVID-19 declaration under Section 564(b)(1) of the Act, 21 U.S.C.section 360bbb-3(b)(1), unless the authorization is terminated  or revoked sooner.       Influenza A by PCR NEGATIVE NEGATIVE Final   Influenza B by PCR NEGATIVE NEGATIVE Final    Comment: (NOTE) The Xpert Xpress SARS-CoV-2/FLU/RSV plus assay is intended as an aid in the diagnosis of influenza from Nasopharyngeal swab specimens and should not be used as a sole basis for treatment. Nasal washings and aspirates are unacceptable for Xpert Xpress SARS-CoV-2/FLU/RSV testing.  Fact Sheet for Patients: EntrepreneurPulse.com.au  Fact Sheet for Healthcare Providers: IncredibleEmployment.be  This test is not yet approved or cleared by the [place] FDA and has been authorized for detection and/or diagnosis of SARS-CoV-2 by FDA  under an Emergency Use Authorization (EUA). This EUA will remain in effect (meaning this test can be used) for the duration of the COVID-19 declaration under Section 564(b)(1) of the Act, 21 U.S.C. section 360bbb-3(b)(1), unless the authorization is terminated or revoked.  Performed at [facility], [address] [place], [place] [address]   Urine Culture     Status: Abnormal   Collection Time: 02/04/21  3:21 PM   Specimen: Urine, Random  Result Value Ref Range Status   Specimen Description   Final    URINE, RANDOM Performed at [facility], [address], [place], [place] [address]    Special Requests   Final    NONE Performed at [facility], [address] [place], [place] [address]    Culture >=100,000 COLONIES/mL YEAST (A)  Final   Report Status 02/06/2021 FINAL  Final  Blood culture (routine single)     Status: None (Preliminary result)   Collection Time: 02/04/21  3:21 PM   Specimen: BLOOD  Result Value Ref Range Status   Specimen Description BLOOD  LEFT ARM  Final   Special Requests   Final    BOTTLES DRAWN AEROBIC AND ANAEROBIC Blood Culture results may not be optimal due to an excessive volume of blood received in culture bottles   Culture   Final    NO GROWTH 4 DAYS Performed at [facility], [address], [place], [place] [address]    Report Status PENDING  Incomplete  Culture, blood (single)     Status: None (Preliminary result)   Collection Time: 02/05/21  1:45 AM   Specimen: BLOOD  Result Value Ref Range Status   Specimen Description BLOOD THUMB  Final   Special Requests   Final    BOTTLES DRAWN AEROBIC ONLY Blood Culture adequate volume   Culture   Final    NO GROWTH 3 DAYS Performed at [facility], [address] [place], [place] [address]    Report Status PENDING  Incomplete  C Difficile Quick Screen w PCR reflex     Status: Abnormal   Collection Time: 02/06/21  6:10 AM   Specimen: STOOL   Result Value Ref Range Status   C Diff antigen POSITIVE (A) NEGATIVE Final   C Diff toxin NEGATIVE NEGATIVE Final   C Diff interpretation Results are indeterminate. See PCR results.  Final    Comment: Performed at [facility], [address] [place], [place] [address]  C. Diff by PCR, Reflexed     Status: Abnormal   Collection Time: 02/06/21  6:10 AM  Result Value Ref Range Status   Toxigenic C. Difficile by PCR POSITIVE (A) NEGATIVE Final    Comment: Positive for toxigenic C. difficile with little to no toxin production. Only treat if clinical presentation suggests symptomatic illness. Performed at [facility], [address] [place], [place] [address]   MRSA PCR Screening     Status: Abnormal   Collection Time: 02/06/21  1:29 PM   Specimen: Nasopharyngeal  Result Value Ref Range Status   MRSA by PCR POSITIVE (A) NEGATIVE Final    Comment:        The GeneXpert MRSA Assay (FDA approved for NASAL specimens only), is one component of a comprehensive MRSA colonization surveillance program. It is not intended to diagnose MRSA infection nor to guide or monitor treatment for MRSA infections. CRITICAL RESULT CALLED TO, READ BACK BY AND VERIFIED WITH: BURNS,RN @1539  ON 05.17.22.SH Performed at [facility], [address], [place], [place] [address]          Radiology Studies: No results found.      Scheduled Meds: . apixaban  2.5 mg Per Tube BID  . bisacodyl  10 mg Rectal QHS  . Chlorhexidine Gluconate Cloth  6 each Topical Daily  . collagenase   Topical Daily  . famotidine  20 mg Per Tube Daily  . feeding supplement (OSMOLITE 1.5 CAL)  474 mL Per Tube QID  . feeding supplement (PROSource TF)  45 mL Per Tube TID  . FLUoxetine  40 mg Per Tube Daily  . free water  400 mL Per Tube Q2H  . guaiFENesin  20 mL Per Tube Q6H  . ipratropium-albuterol  3 mL Nebulization BID  . mupirocin ointment  1 application Nasal BID  . nutrition  supplement (JUVEN)  1 packet Per Tube BID BM  . scopolamine  1 patch Transdermal Q72H  . traZODone  50 mg Per Tube QHS   Continuous Infusions: . ertapenem Stopped (02/07/21 0856)  . vancomycin 500 mg (02/07/21 2129)     LOS: 3 days    Time spent: 35 mins     Wyvonnia Dusky, MD [facility] Pager [phone]  If 7PM-7AM, please contact night-coverage 02/08/2021, 7:31 AM

## 2021-02-08 NOTE — TOC Initial Note (Signed)
Transition of Care [facility]) - Initial/Assessment Note    Patient Details  Name: Ryan Day MRN: [id number] Date of Birth: 1951/09/01  Transition of Care [facility]) CM/SW Contact:    Candie Chroman, LCSW Phone Number: 02/08/2021, 11:42 AM  Clinical Narrative:  Per MD, likely discharge on Monday. Plan to continue IV antibiotics while inpatient and then discontinue at discharge. CSW called and notified [place] and [facility] liaisons. MD stated that hospice agency was working on getting him a hospital bed with an extender. Hospice liaison will confirm.                Expected Discharge Plan: Home w Hospice Care Barriers to Discharge: Continued Medical Work up   Patient Goals and CMS Choice     Choice offered to / list presented to : NA  Expected Discharge Plan and Services Expected Discharge Plan: [place] Acute Care Choice: Resumption of Svcs/PTA Provider Living arrangements for the past 2 months: Single Family Home                                      Prior Living Arrangements/Services Living arrangements for the past 2 months: Single Family Home Lives with:: Spouse Patient language and need for interpreter reviewed:: Yes        Need for Family Participation in Patient Care: Yes (Comment) Care giver support system in place?: Yes (comment) Current home services: DME,Hospice Criminal Activity/Legal Involvement Pertinent to Current Situation/Hospitalization: No - Comment as needed  Activities of Daily Living Home Assistive Devices/Equipment: Vent/Trach supplies,Enteral Feeding Supplies ADL Screening (condition at time of admission) Patient's cognitive ability adequate to safely complete daily activities?: Yes Is the patient deaf or have difficulty hearing?: No Does the patient have difficulty seeing, even when wearing glasses/contacts?: No Does the patient have difficulty concentrating, remembering, or making decisions?: No Patient  able to express need for assistance with ADLs?: Yes Does the patient have difficulty dressing or bathing?: Yes Independently performs ADLs?: No Communication: Independent Dressing (OT): Dependent Is this a change from baseline?: Pre-admission baseline Grooming: Dependent Is this a change from baseline?: Pre-admission baseline Feeding: Dependent Is this a change from baseline?: Pre-admission baseline Bathing: Dependent Is this a change from baseline?: Pre-admission baseline Toileting: Dependent Is this a change from baseline?: Pre-admission baseline In/Out Bed: Dependent Is this a change from baseline?: Pre-admission baseline Walks in Home: Dependent Is this a change from baseline?: Pre-admission baseline Does the patient have difficulty walking or climbing stairs?: Yes Weakness of Legs: Both Weakness of Arms/Hands: Both  Permission Sought/Granted                  Emotional Assessment Appearance:: Appears stated age     Orientation: : Oriented to Self,Oriented to Place,Oriented to  Time,Oriented to Situation Alcohol / Substance Use: Not Applicable Psych Involvement: No (comment)  Admission diagnosis:  Hyperchloremia [E87.8] Quadriplegia ([place]) [G82.50] Hypernatremia [E87.0] Hyperglycemia [R73.9] Tracheostomy care ([place]) [Z43.0] AKI (acute kidney injury) ([place]) [N17.9] Wound of sacral region, initial encounter [S31.000A] Urinary tract infection associated with indwelling urethral catheter, subsequent encounter [T83.511D, N39.0] Infection of peripherally inserted central catheter (PICC), initial encounter [T80.219A] Patient Active Problem List   Diagnosis Date Noted  . AKI (acute kidney injury) ([place]) 02/04/2021  . Abdominal distension   . Chronic respiratory failure with hypoxia ([place])   . Swelling   . Septic shock ([place])  12/07/2020  . Decubitus ulcer of sacral region, stage 3 ([place])   . Hypokalemia 08/15/2020  . Hypoalbuminemia 08/15/2020  . Leukocytosis 08/15/2020   . Pneumonia 08/14/2020  . Goals of care, counseling/discussion   . Sacral decubitus ulcer, stage II ([place]) 07/25/2020  . Aspiration pneumonia ([place]) 07/25/2020  . Palliative care by specialist   . Salivary secretion   . Spasticity   . Spinal cord injury, cervical region, sequela ([place]) 06/23/2020  . Pressure injury of skin 06/23/2020  . S/P percutaneous endoscopic gastrostomy (PEG) tube placement ([place])   . Tracheostomy dependent ([place])   . Quadriplegia ([place])   . Neurogenic orthostatic hypotension ([place])   . Acute on chronic anemia   . Hypernatremia   . Neurogenic bladder   . Neurogenic bowel   . Acute respiratory failure with hypoxia and hypercapnia (HCC)   . Autonomic instability   . Cardiac arrest ([place])   . Cervical spinal cord injury, sequela ([place])   . Acute renal injury due to hypovolemia ([place])   . Family history of stomach cancer   . Exercise-induced leg cramps 05/13/2017  . Elevated alkaline phosphatase level 03/13/2015  . Hyperlipidemia   . Hypertension    PCP:  Valerie Roys, DO Pharmacy:   [facility] [address]), [place] [address] Phone: [phone] Fax: [place], [place] [address] Phone: [phone] Fax: [phone]     Social Determinants of Health (SDOH) Interventions    Readmission Risk Interventions No flowsheet data found.

## 2021-02-09 LAB — COMPREHENSIVE METABOLIC PANEL
ALT: 51 U/L — ABNORMAL HIGH (ref 0–44)
AST: 44 U/L — ABNORMAL HIGH (ref 15–41)
Albumin: 2.7 g/dL — ABNORMAL LOW (ref 3.5–5.0)
Alkaline Phosphatase: 185 U/L — ABNORMAL HIGH (ref 38–126)
Anion gap: 7 (ref 5–15)
BUN: 33 mg/dL — ABNORMAL HIGH (ref 8–23)
CO2: 23 mmol/L (ref 22–32)
Calcium: 9.8 mg/dL (ref 8.9–10.3)
Chloride: 115 mmol/L — ABNORMAL HIGH (ref 98–111)
Creatinine, Ser: 0.8 mg/dL (ref 0.61–1.24)
GFR, Estimated: >60 mL/min (ref >60)
Glucose, Bld: 93 mg/dL (ref 70–99)
Potassium: 4.7 mmol/L (ref 3.5–5.1)
Sodium: 145 mmol/L (ref 135–145)
Total Bilirubin: 0.9 mg/dL (ref 0.3–1.2)
Total Protein: 6.9 g/dL (ref 6.5–8.1)

## 2021-02-09 LAB — CBC
HCT: 33.9 % — ABNORMAL LOW (ref 39.0–52.0)
Hemoglobin: 10.8 g/dL — ABNORMAL LOW (ref 13.0–17.0)
MCH: 29.3 pg (ref 26.0–34.0)
MCHC: 31.9 g/dL (ref 30.0–36.0)
MCV: 92.1 fL (ref 80.0–100.0)
Platelets: 168 10*3/uL (ref 150–400)
RBC: 3.68 MIL/uL — ABNORMAL LOW (ref 4.22–5.81)
RDW: 16.5 % — ABNORMAL HIGH (ref 11.5–15.5)
WBC: 8.2 10*3/uL (ref 4.0–10.5)
nRBC: 0 % (ref 0.0–0.2)

## 2021-02-09 LAB — CULTURE, BLOOD (SINGLE): Culture: NO GROWTH

## 2021-02-09 LAB — VANCOMYCIN, RANDOM: Vancomycin Rm: 13

## 2021-02-09 MED ORDER — VANCOMYCIN HCL 750 MG/150ML IV SOLN
750.0000 mg | Freq: Two times a day (BID) | INTRAVENOUS | Status: DC
  Administered 2021-02-09 – 2021-02-11 (×6): 750 mg via INTRAVENOUS
  Filled 2021-02-09 (×9): qty 150

## 2021-02-09 MED ORDER — BACLOFEN 10 MG PO TABS
10.0000 mg | ORAL_TABLET | Freq: Three times a day (TID) | ORAL | Status: DC
  Administered 2021-02-09 – 2021-02-10 (×4): 10 mg via ORAL
  Filled 2021-02-09 (×5): qty 1

## 2021-02-09 NOTE — Progress Notes (Signed)
PROGRESS NOTE    Ryan Day  [id number] DOB: Mar 28, 1951 DOA: 02/04/2021 PCP: Valerie Roys, DO   Assessment & Plan:   Principal Problem:   AKI (acute kidney injury) ([place]) Active Problems:   Hypertension   Elevated alkaline phosphatase level   Cervical spinal cord injury, sequela (HCC)   Spinal cord injury, cervical region, sequela (HCC)   Pressure injury of skin   S/P percutaneous endoscopic gastrostomy (PEG) tube placement (HCC)   Tracheostomy dependent (HCC)   Quadriplegia (HCC)   Neurogenic orthostatic hypotension (HCC)   Hypernatremia   Neurogenic bowel   Decubitus ulcer of sacral region, stage 3 ([place])    Probable PICC line infection: w/ right upper extremity cellulitis. PICC line was removed on 02/04/2021.  Blood cxs NGTD.  Continue on ertapenem. PICC line will not be placed again as per pt's family request    AKI: resolved  Hypernatremia: resolved   Hx of osteomyelitis of sacrum/coccyx: continue on IV ertapenem until through 5/23/222  History of cervical spinal cord injury: complicated by quadriplegia, dysphagia, chronic respiratory failure and urinary retention. Continue oxygen via trach collar.  Continue enteral nutrition via PEG tube.  Foley catheter in place for urinary retention.  Continue supportive care.  Unstageable left heel necrotic decubitus ulcer, unstageable right heel necrotic decubitus ulcer: continue w/ wound care   Depression: severity unknown. Continue on home dose of fluoxetine   Insomnia: continue on home dose of trazodone   Right upper extremity swelling: [place] was neg for DVT of RUE  DVT prophylaxis: eliquis  Code Status: DNR Family Communication: discussed pt's care w/ pt's wife, [name], and answered her questions  Disposition Plan: waiting on hospital bed w/ extender for pt to be d/c home to continue hospice at home but will continue treatment while still in the hospital. Likely d/c home on 02/12/21  Level of care: Med-Surg    Status is: Inpatient  Remains inpatient appropriate because:Unsafe d/c plan, IV treatments appropriate due to intensity of illness or inability to take PO and Inpatient level of care appropriate due to severity of illness   Dispo: The patient is from: Home              Anticipated d/c is to: Home w/ hospice once all supplies are delivered to the pt's home              Patient currently is not medically stable to d/c.   Difficult to place patient Yes        Consultants:      Procedures:    Antimicrobials: ertapenem    Subjective: Pt of malaise   Objective: Vitals:   02/08/21 1944 02/08/21 2126 02/09/21 0012 02/09/21 0415  BP: 138/86  112/61 133/79  Pulse: 71  77 69  Resp: 20  20 20   Temp: 98.1 F (36.7 C)  97.7 F (36.5 C) 97.9 F (36.6 C)  TempSrc: Oral  Oral Oral  SpO2: 100% 100% 100% 100%  Weight:        Intake/Output Summary (Last 24 hours) at 02/09/2021 0737 Last data filed at 02/09/2021 0500 Gross per 24 hour  Intake 300.07 ml  Output 1400 ml  Net -1099.93 ml   Filed Weights   02/04/21 1400  Weight: 110 kg    Examination:  General exam: Appears comfortable   Respiratory system: diminished breath sounds b/l  Cardiovascular system: SS1 & S2+. No rubs or clicks   Gastrointestinal system: Abd is soft, NT, ND & hypoactive bowel sounds b/l  Central nervous system: Alert and awake  Psychiatry: judgement and insight appear normal. Flat mood and affect     Data Reviewed: I have personally reviewed following labs and imaging studies  CBC: Recent Labs  Lab 02/04/21 1450 02/05/21 0145 02/06/21 0459 02/07/21 0552 02/08/21 0439  WBC 11.6* 9.2 10.2 8.2 9.1  NEUTROABS 6.4  --  5.0 4.2  --   HGB 11.5* 11.1* 9.5* 8.5* 9.8*  HCT 37.3* 35.5* 29.7* 26.3* 29.7*  MCV 92.8 92.0 92.8 91.3 88.1  PLT 227 176 176 164 309   Basic Metabolic Panel: Recent Labs  Lab 02/04/21 1450 02/05/21 0145 02/06/21 0459 02/07/21 0552 02/08/21 0439  NA 157* 154*  152* 142 148*  K 4.0 3.8 5.0 3.9 5.1  CL 118* 120* 118* 110 117*  CO2 31 27 27 25 25   GLUCOSE 177* 189* 99 190* 102*  BUN 54* 50* 42* 30* 31*  CREATININE 1.30* 1.14 0.99 0.88 0.86  CALCIUM 10.1 9.6 9.4 8.9 9.6   GFR: Estimated Creatinine Clearance: 113.4 mL/min (by C-G formula based on SCr of 0.86 mg/dL). Liver Function Tests: Recent Labs  Lab 02/04/21 1450 02/08/21 0439  AST 78* 32  ALT 80* 43  ALKPHOS 188* 157*  BILITOT 1.0 0.7  PROT 7.8 6.8  ALBUMIN 3.0* 2.6*   No results for input(s): LIPASE, AMYLASE in the last 168 hours. No results for input(s): AMMONIA in the last 168 hours. Coagulation Profile: Recent Labs  Lab 02/04/21 1521  INR 1.6*   Cardiac Enzymes: No results for input(s): CKTOTAL, CKMB, CKMBINDEX, TROPONINI in the last 168 hours. BNP (last 3 results) No results for input(s): PROBNP in the last 8760 hours. HbA1C: No results for input(s): HGBA1C in the last 72 hours. CBG: Recent Labs  Lab 02/08/21 1006 02/08/21 1220  GLUCAP 82 141*   Lipid Profile: No results for input(s): CHOL, HDL, LDLCALC, TRIG, CHOLHDL, LDLDIRECT in the last 72 hours. Thyroid Function Tests: No results for input(s): TSH, T4TOTAL, FREET4, T3FREE, THYROIDAB in the last 72 hours. Anemia Panel: No results for input(s): VITAMINB12, FOLATE, FERRITIN, TIBC, IRON, RETICCTPCT in the last 72 hours. Sepsis Labs: Recent Labs  Lab 02/04/21 1521 02/04/21 1748  PROCALCITON <0.10  --   LATICACIDVEN 2.1* 1.5    Recent Results (from the past 240 hour(s))  Resp Panel by RT-PCR (Flu A&B, Covid) Nasopharyngeal Swab     Status: None   Collection Time: 02/04/21  2:50 PM   Specimen: Nasopharyngeal Swab; Nasopharyngeal(NP) swabs in vial transport medium  Result Value Ref Range Status   SARS Coronavirus 2 by RT PCR NEGATIVE NEGATIVE Final    Comment: (NOTE) SARS-CoV-2 target nucleic acids are NOT DETECTED.  The SARS-CoV-2 RNA is generally detectable in upper respiratory specimens during the  acute phase of infection. The lowest concentration of SARS-CoV-2 viral copies this assay can detect is 138 copies/mL. A negative result does not preclude SARS-Cov-2 infection and should not be used as the sole basis for treatment or other patient management decisions. A negative result may occur with  improper specimen collection/handling, submission of specimen other than nasopharyngeal swab, presence of viral mutation(s) within the areas targeted by this assay, and inadequate number of viral copies(<138 copies/mL). A negative result must be combined with clinical observations, patient history, and epidemiological information. The expected result is Negative.  Fact Sheet for Patients:  EntrepreneurPulse.com.au  Fact Sheet for Healthcare Providers:  IncredibleEmployment.be  This test is no t yet approved or cleared by the [place] and  has been authorized for detection and/or diagnosis of SARS-CoV-2 by FDA under an Emergency Use Authorization (EUA). This EUA will remain  in effect (meaning this test can be used) for the duration of the COVID-19 declaration under Section 564(b)(1) of the Act, 21 U.S.C.section 360bbb-3(b)(1), unless the authorization is terminated  or revoked sooner.       Influenza A by PCR NEGATIVE NEGATIVE Final   Influenza B by PCR NEGATIVE NEGATIVE Final    Comment: (NOTE) The Xpert Xpress SARS-CoV-2/FLU/RSV plus assay is intended as an aid in the diagnosis of influenza from Nasopharyngeal swab specimens and should not be used as a sole basis for treatment. Nasal washings and aspirates are unacceptable for Xpert Xpress SARS-CoV-2/FLU/RSV testing.  Fact Sheet for Patients: EntrepreneurPulse.com.au  Fact Sheet for Healthcare Providers: IncredibleEmployment.be  This test is not yet approved or cleared by the [place] FDA and has been authorized for detection and/or  diagnosis of SARS-CoV-2 by FDA under an Emergency Use Authorization (EUA). This EUA will remain in effect (meaning this test can be used) for the duration of the COVID-19 declaration under Section 564(b)(1) of the Act, 21 U.S.C. section 360bbb-3(b)(1), unless the authorization is terminated or revoked.  Performed at [facility], [address] [place], [place] [address]   Urine Culture     Status: Abnormal   Collection Time: 02/04/21  3:21 PM   Specimen: Urine, Random  Result Value Ref Range Status   Specimen Description   Final    URINE, RANDOM Performed at [facility], [address] [place], [place] [address]    Special Requests   Final    NONE Performed at [facility], [address] [place], [place] [address]    Culture >=100,000 COLONIES/mL YEAST (A)  Final   Report Status 02/06/2021 FINAL  Final  Blood culture (routine single)     Status: None   Collection Time: 02/04/21  3:21 PM   Specimen: BLOOD  Result Value Ref Range Status   Specimen Description BLOOD  LEFT ARM  Final   Special Requests   Final    BOTTLES DRAWN AEROBIC AND ANAEROBIC Blood Culture results may not be optimal due to an excessive volume of blood received in culture bottles   Culture   Final    NO GROWTH 5 DAYS Performed at [facility], [address] [place], [place] [address]    Report Status 02/09/2021 FINAL  Final  Culture, blood (single)     Status: None (Preliminary result)   Collection Time: 02/05/21  1:45 AM   Specimen: BLOOD  Result Value Ref Range Status   Specimen Description BLOOD THUMB  Final   Special Requests   Final    BOTTLES DRAWN AEROBIC ONLY Blood Culture adequate volume   Culture   Final    NO GROWTH 4 DAYS Performed at [facility], [address], [place], [place] [address]    Report Status PENDING  Incomplete  C Difficile Quick Screen w PCR reflex     Status: Abnormal   Collection Time: 02/06/21  6:10 AM    Specimen: STOOL  Result Value Ref Range Status   C Diff antigen POSITIVE (A) NEGATIVE Final   C Diff toxin NEGATIVE NEGATIVE Final   C Diff interpretation Results are indeterminate. See PCR results.  Final    Comment: Performed at [facility], [address] [place],  [address]  C. Diff by PCR, Reflexed     Status: Abnormal  Collection Time: 02/06/21  6:10 AM  Result Value Ref Range Status   Toxigenic C. Difficile by PCR POSITIVE (A) NEGATIVE Final    Comment: Positive for toxigenic C. difficile with little to no toxin production. Only treat if clinical presentation suggests symptomatic illness. Performed at [facility], [address] [place], [place] [address]   MRSA PCR Screening     Status: Abnormal   Collection Time: 02/06/21  1:29 PM   Specimen: Nasopharyngeal  Result Value Ref Range Status   MRSA by PCR POSITIVE (A) NEGATIVE Final    Comment:        The GeneXpert MRSA Assay (FDA approved for NASAL specimens only), is one component of a comprehensive MRSA colonization surveillance program. It is not intended to diagnose MRSA infection nor to guide or monitor treatment for MRSA infections. CRITICAL RESULT CALLED TO, READ BACK BY AND VERIFIED WITH: BURNS,RN @1539  ON 05.17.22.SH Performed at [facility], [address], [place], [place] [address]          Radiology Studies: No results found.      Scheduled Meds: . apixaban  2.5 mg Per Tube BID  . baclofen  10 mg Oral QID  . bisacodyl  10 mg Rectal QHS  . Chlorhexidine Gluconate Cloth  6 each Topical Daily  . collagenase   Topical Daily  . famotidine  20 mg Per Tube Daily  . feeding supplement (OSMOLITE 1.5 CAL)  474 mL Per Tube QID  . feeding supplement (PROSource TF)  45 mL Per Tube TID  . FLUoxetine  40 mg Per Tube Daily  . free water  400 mL Per Tube Q2H  . guaiFENesin  20 mL Per Tube Q6H  . mupirocin ointment  1 application Nasal BID  . nutrition supplement  (JUVEN)  1 packet Per Tube BID BM  . scopolamine  1 patch Transdermal Q72H  . traZODone  50 mg Per Tube QHS   Continuous Infusions: . ertapenem Stopped (02/08/21 1111)  . vancomycin Stopped (02/08/21 2310)     LOS: 4 days    Time spent: 25 mins     Wyvonnia Dusky, MD [facility] Pager [phone]  If 7PM-7AM, please contact night-coverage 02/09/2021, 7:37 AM

## 2021-02-09 NOTE — Progress Notes (Signed)
Pharmacy Antibiotic Note  Ryan Day is an 70 y.o. male with medical history significant for quadriplegia status post mechanical fall at home with C3-6 laminectomy, chronic tracheostomy, hypertension, hyperlipidemia.  He was admitted on 12/07/2020 when he presented to [facility] emergency room with hematuria. Patient apparently had a Foley catheter placed 2 weeks prior to the presentation and completed treatment with 7-day course of antibiotic therapy for the UTI. He has a sacral wound and sacral wound culture showed Pseudomonas, rare MRSA, few bacteria voids, rare Candida albicans.  Infectious disease was consulted. Patient was on treatment with IV vancomycin, cefepime. He had MRI of his lumbar spine done on 01/08/2021 which per report showed findings concerning for osteomyelitis. He was on treatment with IV vancomycin, ciprofloxacin. Was to be on antibiotics with tentative end date 02/17/21 for osteomyelitis total of 6 weeks.  Pharmacy was consulted for vancomycin dosing. A vancomycin trough was drawn on 02/04/21 at 19 mcg/mL   Plan:  Will continue patient current regimen of vancomycin 500mg  IV q12h  Monitor renal function and adjust dose as clinically indicated  Re-check vancomycin levels weekly  Goal vancomycin trough 15 - 20 mcg/mL  5/20: Vancomycin level at 0942= 13 mcg/ml. Renal fxn has improved. Will adjust Vancomycin dose to 750 mg IV q12h.  Tentative end date for abx is 02/11/21     Weight: 110 kg (242 lb 8.1 oz)  Temp (24hrs), Avg:97.8 F (36.6 C), Min:97.5 F (36.4 C), Max:98.1 F (36.7 C)  Recent Labs  Lab 02/04/21 1521 02/04/21 1748 02/04/21 2017 02/05/21 0145 02/06/21 0459 02/07/21 0552 02/08/21 0439 02/09/21 0942  WBC  --   --   --  9.2 10.2 8.2 9.1 8.2  CREATININE  --   --   --  1.14 0.99 0.88 0.86 0.80  LATICACIDVEN 2.1* 1.5  --   --   --   --   --   --   VANCOTROUGH  --   --  19  --   --   --   --   --   VANCORANDOM  --   --   --   --   --   --   --  13     Estimated Creatinine Clearance: 121.9 mL/min (by C-G formula based on SCr of 0.8 mg/dL).    Allergies  Allergen Reactions  . Shrimp (Diagnostic)     Experiences facial droop when eating seafood    Antimicrobials this admission: 5/15 ertapenem >> 5/15 vancomyin >>  Microbiology results: 5/15 BCx: NG x 1 day 5/15 UCx: yeast 5/17 C diff Ag (+), toxin (-) 5/15 MRSA PCR: +  Thank you for allowing pharmacy to be a part of this patient's care.  Roselina Burgueno A, PharmD 02/09/2021 10:38 AM

## 2021-02-10 LAB — CULTURE, BLOOD (SINGLE)
Culture: NO GROWTH
Special Requests: ADEQUATE

## 2021-02-10 LAB — CBC
HCT: 29.2 % — ABNORMAL LOW (ref 39.0–52.0)
Hemoglobin: 9.5 g/dL — ABNORMAL LOW (ref 13.0–17.0)
MCH: 29.5 pg (ref 26.0–34.0)
MCHC: 32.5 g/dL (ref 30.0–36.0)
MCV: 90.7 fL (ref 80.0–100.0)
Platelets: 163 K/uL (ref 150–400)
RBC: 3.22 MIL/uL — ABNORMAL LOW (ref 4.22–5.81)
RDW: 16.5 % — ABNORMAL HIGH (ref 11.5–15.5)
WBC: 9.7 K/uL (ref 4.0–10.5)
nRBC: 0 % (ref 0.0–0.2)

## 2021-02-10 LAB — COMPREHENSIVE METABOLIC PANEL WITH GFR
ALT: 38 U/L (ref 0–44)
AST: 29 U/L (ref 15–41)
Albumin: 2.7 g/dL — ABNORMAL LOW (ref 3.5–5.0)
Alkaline Phosphatase: 161 U/L — ABNORMAL HIGH (ref 38–126)
Anion gap: 8 (ref 5–15)
BUN: 33 mg/dL — ABNORMAL HIGH (ref 8–23)
CO2: 24 mmol/L (ref 22–32)
Calcium: 9.6 mg/dL (ref 8.9–10.3)
Chloride: 116 mmol/L — ABNORMAL HIGH (ref 98–111)
Creatinine, Ser: 0.92 mg/dL (ref 0.61–1.24)
GFR, Estimated: >60 mL/min
Glucose, Bld: 113 mg/dL — ABNORMAL HIGH (ref 70–99)
Potassium: 4.1 mmol/L (ref 3.5–5.1)
Sodium: 148 mmol/L — ABNORMAL HIGH (ref 135–145)
Total Bilirubin: 0.7 mg/dL (ref 0.3–1.2)
Total Protein: 7.1 g/dL (ref 6.5–8.1)

## 2021-02-10 MED ORDER — CHLORHEXIDINE GLUCONATE 0.12 % MT SOLN
15.0000 mL | Freq: Two times a day (BID) | OROMUCOSAL | Status: DC
  Administered 2021-02-10 – 2021-02-12 (×4): 15 mL via OROMUCOSAL
  Filled 2021-02-10 (×4): qty 15

## 2021-02-10 MED ORDER — ORAL CARE MOUTH RINSE
15.0000 mL | Freq: Two times a day (BID) | OROMUCOSAL | Status: DC
  Administered 2021-02-11 (×2): 15 mL via OROMUCOSAL

## 2021-02-10 NOTE — Progress Notes (Signed)
Bedbath/peri care completed. Patient had copious purulent exudate on sheets under right arm and all over back. Skin sloughing from right arm and back. MD notified.

## 2021-02-10 NOTE — Progress Notes (Signed)
Trach suction performed to obtain sputum specimen.  RN notified to send to lab.

## 2021-02-10 NOTE — Progress Notes (Signed)
PROGRESS NOTE    Chief Walkup  DGU:[id number] DOB: [birth year] DOA: 02/04/2021 PCP: Valerie Roys, DO   Assessment & Plan:   Principal Problem:   AKI (acute kidney injury) ([place]) Active Problems:   Hypertension   Elevated alkaline phosphatase level   Cervical spinal cord injury, sequela (HCC)   Spinal cord injury, cervical region, sequela (HCC)   Pressure injury of skin   S/P percutaneous endoscopic gastrostomy (PEG) tube placement (HCC)   Tracheostomy dependent (HCC)   Quadriplegia (HCC)   Neurogenic orthostatic hypotension (HCC)   Hypernatremia   Neurogenic bowel   Decubitus ulcer of sacral region, stage 3 ([place])    Probable PICC line infection: w/ right upper extremity cellulitis. PICC line was removed on 02/04/2021.  Blood cxs NGTD.  Continue on ertapenem. PICC line will not be placed again as per pt's family request    AKI: resolved  Hypernatremia: labile. Continue w/ free water flushes   Hx of osteomyelitis of sacrum/coccyx: continue on IV ertapenem until 02/12/21   History of cervical spinal cord injury: complicated by quadriplegia, dysphagia, chronic respiratory failure and urinary retention. Continue oxygen via trach collar.  Continue enteral nutrition via PEG tube.  Foley catheter in place for urinary retention.  Continue supportive care.  Unstageable left heel necrotic decubitus ulcer, unstageable right heel necrotic decubitus ulcer: continue w/ wound care   Depression: severity unknown. Continue on home dose of fluoxetine   Insomnia: continue on home dose of trazodone   Right upper extremity swelling: [place] was neg for DVT of RUE  DVT prophylaxis: eliquis  Code Status: DNR Family Communication: discussed pt's care w/ pt's daughter at bedside and answered her questions  Disposition Plan: waiting on hospital bed w/ extender for pt to be d/c home to continue hospice at home but will continue treatment while still in the hospital. Likely d/c home on  02/12/21  Level of care: Med-Surg   Status is: Inpatient  Remains inpatient appropriate because:Unsafe d/c plan, IV treatments appropriate due to intensity of illness or inability to take PO and Inpatient level of care appropriate due to severity of illness   Dispo: The patient is from: Home              Anticipated d/c is to: Home w/ hospice once all supplies are delivered to the pt's home              Patient currently is not medically stable to d/c.   Difficult to place patient Yes        Consultants:      Procedures:    Antimicrobials: ertapenem    Subjective: Pt was very sleepy this morning and denied any pain   Objective: Vitals:   02/09/21 1211 02/09/21 1540 02/10/21 0431 02/10/21 0446  BP: 127/65  (!) 116/51   Pulse: 69  89   Resp: 15  20   Temp: 97.9 F (36.6 C)  99.1 F (37.3 C)   TempSrc: Oral  Oral   SpO2: 100% 97% 98% 97%  Weight:        Intake/Output Summary (Last 24 hours) at 02/10/2021 0739 Last data filed at 02/10/2021 0244 Gross per 24 hour  Intake 134.2 ml  Output 1000 ml  Net -865.8 ml   Filed Weights   02/04/21 1400  Weight: 110 kg    Examination:  General exam: Appears lethargic   Respiratory system: decreased breath sounds b/l  Cardiovascular system: S1 & S2+. No rubs or clicks  Gastrointestinal system:  Abd is soft, NT, ND & hypoactive bowel sounds b/l   Central nervous system: Lethargic  Psychiatry: judgement and insight appear abnormal. Flat mood and affect    Data Reviewed: I have personally reviewed following labs and imaging studies  CBC: Recent Labs  Lab 02/04/21 1450 02/05/21 0145 02/06/21 0459 02/07/21 0552 02/08/21 0439 02/09/21 0942 02/10/21 0414  WBC 11.6*   < > 10.2 8.2 9.1 8.2 9.7  NEUTROABS 6.4  --  5.0 4.2  --   --   --   HGB 11.5*   < > 9.5* 8.5* 9.8* 10.8* 9.5*  HCT 37.3*   < > 29.7* 26.3* 29.7* 33.9* 29.2*  MCV 92.8   < > 92.8 91.3 88.1 92.1 90.7  PLT 227   < > 176 164 189 168 163   < > =  values in this interval not displayed.   Basic Metabolic Panel: Recent Labs  Lab 02/06/21 0459 02/07/21 0552 02/08/21 0439 02/09/21 0942 02/10/21 0414  NA 152* 142 148* 145 148*  K 5.0 3.9 5.1 4.7 4.1  CL 118* 110 117* 115* 116*  CO2 27 25 25 23 24   GLUCOSE 99 190* 102* 93 113*  BUN 42* 30* 31* 33* 33*  CREATININE 0.99 0.88 0.86 0.80 0.92  CALCIUM 9.4 8.9 9.6 9.8 9.6   GFR: Estimated Creatinine Clearance: 106 mL/min (by C-G formula based on SCr of 0.92 mg/dL). Liver Function Tests: Recent Labs  Lab 02/04/21 1450 02/08/21 0439 02/09/21 0942 02/10/21 0414  AST 78* 32 44* 29  ALT 80* 43 51* 38  ALKPHOS 188* 157* 185* 161*  BILITOT 1.0 0.7 0.9 0.7  PROT 7.8 6.8 6.9 7.1  ALBUMIN 3.0* 2.6* 2.7* 2.7*   No results for input(s): LIPASE, AMYLASE in the last 168 hours. No results for input(s): AMMONIA in the last 168 hours. Coagulation Profile: Recent Labs  Lab 02/04/21 1521  INR 1.6*   Cardiac Enzymes: No results for input(s): CKTOTAL, CKMB, CKMBINDEX, TROPONINI in the last 168 hours. BNP (last 3 results) No results for input(s): PROBNP in the last 8760 hours. HbA1C: No results for input(s): HGBA1C in the last 72 hours. CBG: Recent Labs  Lab 02/08/21 1006 02/08/21 1220  GLUCAP 82 141*   Lipid Profile: No results for input(s): CHOL, HDL, LDLCALC, TRIG, CHOLHDL, LDLDIRECT in the last 72 hours. Thyroid Function Tests: No results for input(s): TSH, T4TOTAL, FREET4, T3FREE, THYROIDAB in the last 72 hours. Anemia Panel: No results for input(s): VITAMINB12, FOLATE, FERRITIN, TIBC, IRON, RETICCTPCT in the last 72 hours. Sepsis Labs: Recent Labs  Lab 02/04/21 1521 02/04/21 1748  PROCALCITON <0.10  --   LATICACIDVEN 2.1* 1.5    Recent Results (from the past 240 hour(s))  Resp Panel by RT-PCR (Flu A&B, Covid) Nasopharyngeal Swab     Status: None   Collection Time: 02/04/21  2:50 PM   Specimen: Nasopharyngeal Swab; Nasopharyngeal(NP) swabs in vial transport medium   Result Value Ref Range Status   SARS Coronavirus 2 by RT PCR NEGATIVE NEGATIVE Final    Comment: (NOTE) SARS-CoV-2 target nucleic acids are NOT DETECTED.  The SARS-CoV-2 RNA is generally detectable in upper respiratory specimens during the acute phase of infection. The lowest concentration of SARS-CoV-2 viral copies this assay can detect is 138 copies/mL. A negative result does not preclude SARS-Cov-2 infection and should not be used as the sole basis for treatment or other patient management decisions. A negative result may occur with  improper specimen collection/handling, submission of specimen other than  nasopharyngeal swab, presence of viral mutation(s) within the areas targeted by this assay, and inadequate number of viral copies(<138 copies/mL). A negative result must be combined with clinical observations, patient history, and epidemiological information. The expected result is Negative.  Fact Sheet for Patients:  EntrepreneurPulse.com.au  Fact Sheet for Healthcare Providers:  IncredibleEmployment.be  This test is no t yet approved or cleared by the [place] FDA and  has been authorized for detection and/or diagnosis of SARS-CoV-2 by FDA under an Emergency Use Authorization (EUA). This EUA will remain  in effect (meaning this test can be used) for the duration of the COVID-19 declaration under Section 564(b)(1) of the Act, 21 U.S.C.section 360bbb-3(b)(1), unless the authorization is terminated  or revoked sooner.       Influenza A by PCR NEGATIVE NEGATIVE Final   Influenza B by PCR NEGATIVE NEGATIVE Final    Comment: (NOTE) The Xpert Xpress SARS-CoV-2/FLU/RSV plus assay is intended as an aid in the diagnosis of influenza from Nasopharyngeal swab specimens and should not be used as a sole basis for treatment. Nasal washings and aspirates are unacceptable for Xpert Xpress SARS-CoV-2/FLU/RSV testing.  Fact Sheet for  Patients: EntrepreneurPulse.com.au  Fact Sheet for Healthcare Providers: IncredibleEmployment.be  This test is not yet approved or cleared by the [place] FDA and has been authorized for detection and/or diagnosis of SARS-CoV-2 by FDA under an Emergency Use Authorization (EUA). This EUA will remain in effect (meaning this test can be used) for the duration of the COVID-19 declaration under Section 564(b)(1) of the Act, 21 U.S.C. section 360bbb-3(b)(1), unless the authorization is terminated or revoked.  Performed at [facility], [address], [place], [place] [address]   Urine Culture     Status: Abnormal   Collection Time: 02/04/21  3:21 PM   Specimen: Urine, Random  Result Value Ref Range Status   Specimen Description   Final    URINE, RANDOM Performed at [facility], [address] [place], [place] [address]    Special Requests   Final    NONE Performed at [facility], [address] [place], [place] [address]    Culture >=100,000 COLONIES/mL YEAST (A)  Final   Report Status 02/06/2021 FINAL  Final  Blood culture (routine single)     Status: None   Collection Time: 02/04/21  3:21 PM   Specimen: BLOOD  Result Value Ref Range Status   Specimen Description BLOOD  LEFT ARM  Final   Special Requests   Final    BOTTLES DRAWN AEROBIC AND ANAEROBIC Blood Culture results may not be optimal due to an excessive volume of blood received in culture bottles   Culture   Final    NO GROWTH 5 DAYS Performed at [facility], [address], [place], [place] [address]    Report Status 02/09/2021 FINAL  Final  Culture, blood (single)     Status: None   Collection Time: 02/05/21  1:45 AM   Specimen: BLOOD  Result Value Ref Range Status   Specimen Description BLOOD THUMB  Final   Special Requests   Final    BOTTLES DRAWN AEROBIC ONLY Blood Culture adequate volume   Culture   Final    NO GROWTH 5  DAYS Performed at [facility], [address] [place], [place] [address]    Report Status 02/10/2021 FINAL  Final  C Difficile Quick Screen w PCR reflex     Status: Abnormal   Collection Time: 02/06/21  6:10 AM   Specimen: STOOL  Result Value Ref Range Status   C Diff antigen POSITIVE (A) NEGATIVE Final   C Diff toxin NEGATIVE NEGATIVE Final   C Diff interpretation Results are indeterminate. See PCR results.  Final    Comment: Performed at [facility], [address] [place], [place] [address]  C. Diff by PCR, Reflexed     Status: Abnormal   Collection Time: 02/06/21  6:10 AM  Result Value Ref Range Status   Toxigenic C. Difficile by PCR POSITIVE (A) NEGATIVE Final    Comment: Positive for toxigenic C. difficile with little to no toxin production. Only treat if clinical presentation suggests symptomatic illness. Performed at [facility], [address] [place], [place] [address]   MRSA PCR Screening     Status: Abnormal   Collection Time: 02/06/21  1:29 PM   Specimen: Nasopharyngeal  Result Value Ref Range Status   MRSA by PCR POSITIVE (A) NEGATIVE Final    Comment:        The GeneXpert MRSA Assay (FDA approved for NASAL specimens only), is one component of a comprehensive MRSA colonization surveillance program. It is not intended to diagnose MRSA infection nor to guide or monitor treatment for MRSA infections. CRITICAL RESULT CALLED TO, READ BACK BY AND VERIFIED WITH: BURNS,RN @1539  ON 05.17.22.SH Performed at [facility], [address] [place], [place] [address]          Radiology Studies: No results found.      Scheduled Meds: . apixaban  2.5 mg Per Tube BID  . baclofen  10 mg Oral TID  . bisacodyl  10 mg Rectal QHS  . Chlorhexidine Gluconate Cloth  6 each Topical Daily  . collagenase   Topical Daily  . famotidine  20 mg Per Tube Daily  . feeding supplement (OSMOLITE 1.5 CAL)  474 mL Per Tube QID  . feeding  supplement (PROSource TF)  45 mL Per Tube TID  . FLUoxetine  40 mg Per Tube Daily  . free water  400 mL Per Tube Q2H  . guaiFENesin  20 mL Per Tube Q6H  . mupirocin ointment  1 application Nasal BID  . nutrition supplement (JUVEN)  1 packet Per Tube BID BM  . scopolamine  1 patch Transdermal Q72H  . traZODone  50 mg Per Tube QHS   Continuous Infusions: . ertapenem Stopped (02/09/21 1011)  . vancomycin 750 mg (02/09/21 2250)     LOS: 5 days    Time spent: 28 mins     Wyvonnia Dusky, MD [facility] Pager [phone]  If 7PM-7AM, please contact night-coverage 02/10/2021, 7:39 AM

## 2021-02-10 NOTE — Progress Notes (Signed)
Labs sent

## 2021-02-10 NOTE — TOC Progression Note (Signed)
Transition of Care [facility]) - Progression Note    Patient Details  Name: Ryan Day MRN: [id number] Date of Birth: November 19, 1950  Transition of Care [facility]) CM/SW [place], LCSW Phone Number: 02/10/2021, 9:55 AM  Clinical Narrative:   Patient on [employer]. CSW left a VM for patient's spouse requesting return call to confirm home address per [facility] handoff.   Expected Discharge Plan: Home w Hospice Care Barriers to Discharge: Continued Medical Work up  Expected Discharge Plan and Services Expected Discharge Plan: [place] Acute Care Choice: Resumption of Svcs/PTA Provider Living arrangements for the past 2 months: Single Family Home                                       Social Determinants of Health (SDOH) Interventions    Readmission Risk Interventions No flowsheet data found.

## 2021-02-11 LAB — COMPREHENSIVE METABOLIC PANEL
ALT: 36 U/L (ref 0–44)
AST: 26 U/L (ref 15–41)
Albumin: 2.7 g/dL — ABNORMAL LOW (ref 3.5–5.0)
Alkaline Phosphatase: 158 U/L — ABNORMAL HIGH (ref 38–126)
Anion gap: 7 (ref 5–15)
BUN: 33 mg/dL — ABNORMAL HIGH (ref 8–23)
CO2: 27 mmol/L (ref 22–32)
Calcium: 9.7 mg/dL (ref 8.9–10.3)
Chloride: 112 mmol/L — ABNORMAL HIGH (ref 98–111)
Creatinine, Ser: 0.8 mg/dL (ref 0.61–1.24)
GFR, Estimated: >60 mL/min (ref >60)
Glucose, Bld: 114 mg/dL — ABNORMAL HIGH (ref 70–99)
Potassium: 3.9 mmol/L (ref 3.5–5.1)
Sodium: 146 mmol/L — ABNORMAL HIGH (ref 135–145)
Total Bilirubin: 0.6 mg/dL (ref 0.3–1.2)
Total Protein: 7.3 g/dL (ref 6.5–8.1)

## 2021-02-11 LAB — CBC
HCT: 27.8 % — ABNORMAL LOW (ref 39.0–52.0)
Hemoglobin: 9.1 g/dL — ABNORMAL LOW (ref 13.0–17.0)
MCH: 29.7 pg (ref 26.0–34.0)
MCHC: 32.7 g/dL (ref 30.0–36.0)
MCV: 90.8 fL (ref 80.0–100.0)
Platelets: 193 10*3/uL (ref 150–400)
RBC: 3.06 MIL/uL — ABNORMAL LOW (ref 4.22–5.81)
RDW: 16.7 % — ABNORMAL HIGH (ref 11.5–15.5)
WBC: 6.3 10*3/uL (ref 4.0–10.5)
nRBC: 0 % (ref 0.0–0.2)

## 2021-02-11 NOTE — TOC Progression Note (Addendum)
Transition of Care [facility]) - Progression Note    Patient Details  Name: Ryan Day MRN: [id number] Date of Birth: 1950/11/12  Transition of Care [facility]) CM/SW [place], LCSW Phone Number: 02/11/2021, 9:37 AM  Clinical Narrative:   Called patient's wife. Confirmed home address in chart - [address], [place], [place] - is correct.    Expected Discharge Plan: Home w Hospice Care Barriers to Discharge: Continued Medical Work up  Expected Discharge Plan and Services Expected Discharge Plan: [place] Acute Care Choice: Resumption of Svcs/PTA Provider Living arrangements for the past 2 months: Single Family Home                                       Social Determinants of Health (SDOH) Interventions    Readmission Risk Interventions No flowsheet data found.

## 2021-02-11 NOTE — Progress Notes (Signed)
0500 lab draw was unsuccessful by phlebotomist and this nurse. Lab is notified and tech Ubaldo Glassing confirms that a lab tech will come to obtain blood sample.

## 2021-02-11 NOTE — Progress Notes (Signed)
PROGRESS NOTE    Ryan Day  [id number] DOB: 1951/06/04 DOA: 02/04/2021 PCP: Valerie Roys, DO   Assessment & Plan:   Principal Problem:   AKI (acute kidney injury) ([place]) Active Problems:   Hypertension   Elevated alkaline phosphatase level   Cervical spinal cord injury, sequela (HCC)   Spinal cord injury, cervical region, sequela (HCC)   Pressure injury of skin   S/P percutaneous endoscopic gastrostomy (PEG) tube placement (HCC)   Tracheostomy dependent (HCC)   Quadriplegia (HCC)   Neurogenic orthostatic hypotension (HCC)   Hypernatremia   Neurogenic bowel   Decubitus ulcer of sacral region, stage 3 ([place])    Probable PICC line infection: w/ right upper extremity cellulitis. PICC line was removed on 02/04/2021.  Blood cxs NGTD.  Continue on ertapenem. PICC line will not be placed again as per pt's family request    AKI: resolved  Hypernatremia: labile. Continue w/ free water flushes   Hx of osteomyelitis of sacrum/coccyx: continue on IV ertapenem until 02/12/21  History of cervical spinal cord injury: complicated by quadriplegia, dysphagia, chronic respiratory failure and urinary retention. Continue oxygen via trach collar.  Continue enteral nutrition via PEG tube.  Foley catheter in place for urinary retention.  Continue supportive care.  Unstageable left heel necrotic decubitus ulcer, unstageable right heel necrotic decubitus ulcer: continue w/ wound care   Depression: severity unknown. Continue on home dose of fluoxetine   Insomnia: continue on home dose of trazodone   Right upper extremity swelling: [place] was neg for DVT of RUE  DVT prophylaxis: eliquis  Code Status: DNR Family Communication:  Disposition Plan: waiting on hospital bed w/ extender for pt to be d/c home to continue hospice at home but will continue treatment while still in the hospital. Likely d/c home on 02/12/21  Level of care: Med-Surg   Status is: Inpatient  Remains inpatient  appropriate because:Unsafe d/c plan, IV treatments appropriate due to intensity of illness or inability to take PO and Inpatient level of care appropriate due to severity of illness   Dispo: The patient is from: Home              Anticipated d/c is to: Home w/ hospice once all supplies are delivered to the pt's home              Patient currently is not medically stable to d/c.   Difficult to place patient Yes        Consultants:      Procedures:    Antimicrobials: ertapenem    Subjective: Pt c/o a lot of sputum   Objective: Vitals:   02/10/21 1129 02/10/21 1548 02/10/21 1923 02/11/21 0500  BP: 116/61 129/73 (!) 162/87 126/74  Pulse: 80 85 77 73  Resp:   20 (!) 24  Temp: 98.9 F (37.2 C) 98.4 F (36.9 C) 98.7 F (37.1 C) 98.7 F (37.1 C)  TempSrc:  Oral Oral Oral  SpO2: 100% 98% 100% 100%  Weight:        Intake/Output Summary (Last 24 hours) at 02/11/2021 0739 Last data filed at 02/11/2021 0549 Gross per 24 hour  Intake 343.58 ml  Output 2100 ml  Net -1756.42 ml   Filed Weights   02/04/21 1400  Weight: 110 kg    Examination:  General exam: Appears comfortable   Respiratory system: diminished breath sounds b/l  Cardiovascular system: S1/S2+. No clicks or gallops  Gastrointestinal system: Abd is soft, NT, ND & hypoactive bowel sounds   Central  nervous system: Awake and alert  Psychiatry: judgement and insight appears abnormal. Flat mood and affect     Data Reviewed: I have personally reviewed following labs and imaging studies  CBC: Recent Labs  Lab 02/04/21 1450 02/05/21 0145 02/06/21 0459 02/07/21 0552 02/08/21 0439 02/09/21 0942 02/10/21 0414  WBC 11.6*   < > 10.2 8.2 9.1 8.2 9.7  NEUTROABS 6.4  --  5.0 4.2  --   --   --   HGB 11.5*   < > 9.5* 8.5* 9.8* 10.8* 9.5*  HCT 37.3*   < > 29.7* 26.3* 29.7* 33.9* 29.2*  MCV 92.8   < > 92.8 91.3 88.1 92.1 90.7  PLT 227   < > 176 164 189 168 163   < > = values in this interval not displayed.    Basic Metabolic Panel: Recent Labs  Lab 02/06/21 0459 02/07/21 0552 02/08/21 0439 02/09/21 0942 02/10/21 0414  NA 152* 142 148* 145 148*  K 5.0 3.9 5.1 4.7 4.1  CL 118* 110 117* 115* 116*  CO2 27 25 25 23 24   GLUCOSE 99 190* 102* 93 113*  BUN 42* 30* 31* 33* 33*  CREATININE 0.99 0.88 0.86 0.80 0.92  CALCIUM 9.4 8.9 9.6 9.8 9.6   GFR: Estimated Creatinine Clearance: 106 mL/min (by C-G formula based on SCr of 0.92 mg/dL). Liver Function Tests: Recent Labs  Lab 02/04/21 1450 02/08/21 0439 02/09/21 0942 02/10/21 0414  AST 78* 32 44* 29  ALT 80* 43 51* 38  ALKPHOS 188* 157* 185* 161*  BILITOT 1.0 0.7 0.9 0.7  PROT 7.8 6.8 6.9 7.1  ALBUMIN 3.0* 2.6* 2.7* 2.7*   No results for input(s): LIPASE, AMYLASE in the last 168 hours. No results for input(s): AMMONIA in the last 168 hours. Coagulation Profile: Recent Labs  Lab 02/04/21 1521  INR 1.6*   Cardiac Enzymes: No results for input(s): CKTOTAL, CKMB, CKMBINDEX, TROPONINI in the last 168 hours. BNP (last 3 results) No results for input(s): PROBNP in the last 8760 hours. HbA1C: No results for input(s): HGBA1C in the last 72 hours. CBG: Recent Labs  Lab 02/08/21 1006 02/08/21 1220  GLUCAP 82 141*   Lipid Profile: No results for input(s): CHOL, HDL, LDLCALC, TRIG, CHOLHDL, LDLDIRECT in the last 72 hours. Thyroid Function Tests: No results for input(s): TSH, T4TOTAL, FREET4, T3FREE, THYROIDAB in the last 72 hours. Anemia Panel: No results for input(s): VITAMINB12, FOLATE, FERRITIN, TIBC, IRON, RETICCTPCT in the last 72 hours. Sepsis Labs: Recent Labs  Lab 02/04/21 1521 02/04/21 1748  PROCALCITON <0.10  --   LATICACIDVEN 2.1* 1.5    Recent Results (from the past 240 hour(s))  Resp Panel by RT-PCR (Flu A&B, Covid) Nasopharyngeal Swab     Status: None   Collection Time: 02/04/21  2:50 PM   Specimen: Nasopharyngeal Swab; Nasopharyngeal(NP) swabs in vial transport medium  Result Value Ref Range Status   SARS  Coronavirus 2 by RT PCR NEGATIVE NEGATIVE Final    Comment: (NOTE) SARS-CoV-2 target nucleic acids are NOT DETECTED.  The SARS-CoV-2 RNA is generally detectable in upper respiratory specimens during the acute phase of infection. The lowest concentration of SARS-CoV-2 viral copies this assay can detect is 138 copies/mL. A negative result does not preclude SARS-Cov-2 infection and should not be used as the sole basis for treatment or other patient management decisions. A negative result may occur with  improper specimen collection/handling, submission of specimen other than nasopharyngeal swab, presence of viral mutation(s) within the areas targeted  by this assay, and inadequate number of viral copies(<138 copies/mL). A negative result must be combined with clinical observations, patient history, and epidemiological information. The expected result is Negative.  Fact Sheet for Patients:  EntrepreneurPulse.com.au  Fact Sheet for Healthcare Providers:  IncredibleEmployment.be  This test is no t yet approved or cleared by the [place] FDA and  has been authorized for detection and/or diagnosis of SARS-CoV-2 by FDA under an Emergency Use Authorization (EUA). This EUA will remain  in effect (meaning this test can be used) for the duration of the COVID-19 declaration under Section 564(b)(1) of the Act, 21 U.S.C.section 360bbb-3(b)(1), unless the authorization is terminated  or revoked sooner.       Influenza A by PCR NEGATIVE NEGATIVE Final   Influenza B by PCR NEGATIVE NEGATIVE Final    Comment: (NOTE) The Xpert Xpress SARS-CoV-2/FLU/RSV plus assay is intended as an aid in the diagnosis of influenza from Nasopharyngeal swab specimens and should not be used as a sole basis for treatment. Nasal washings and aspirates are unacceptable for Xpert Xpress SARS-CoV-2/FLU/RSV testing.  Fact Sheet for  Patients: EntrepreneurPulse.com.au  Fact Sheet for Healthcare Providers: IncredibleEmployment.be  This test is not yet approved or cleared by the [place] FDA and has been authorized for detection and/or diagnosis of SARS-CoV-2 by FDA under an Emergency Use Authorization (EUA). This EUA will remain in effect (meaning this test can be used) for the duration of the COVID-19 declaration under Section 564(b)(1) of the Act, 21 U.S.C. section 360bbb-3(b)(1), unless the authorization is terminated or revoked.  Performed at [facility], [address] [place], [place] [address]   Urine Culture     Status: Abnormal   Collection Time: 02/04/21  3:21 PM   Specimen: Urine, Random  Result Value Ref Range Status   Specimen Description   Final    URINE, RANDOM Performed at [facility], [address], [place], [place] [address]    Special Requests   Final    NONE Performed at [facility], [address] [place], [place] [address]    Culture >=100,000 COLONIES/mL YEAST (A)  Final   Report Status 02/06/2021 FINAL  Final  Blood culture (routine single)     Status: None   Collection Time: 02/04/21  3:21 PM   Specimen: BLOOD  Result Value Ref Range Status   Specimen Description BLOOD  LEFT ARM  Final   Special Requests   Final    BOTTLES DRAWN AEROBIC AND ANAEROBIC Blood Culture results may not be optimal due to an excessive volume of blood received in culture bottles   Culture   Final    NO GROWTH 5 DAYS Performed at [facility], [address] [place], [place] [address]    Report Status 02/09/2021 FINAL  Final  Culture, blood (single)     Status: None   Collection Time: 02/05/21  1:45 AM   Specimen: BLOOD  Result Value Ref Range Status   Specimen Description BLOOD THUMB  Final   Special Requests   Final    BOTTLES DRAWN AEROBIC ONLY Blood Culture adequate volume   Culture   Final    NO GROWTH 5  DAYS Performed at [facility], [address] [place], [place] [address]    Report Status 02/10/2021 FINAL  Final  C Difficile Quick Screen w PCR reflex     Status: Abnormal   Collection Time: 02/06/21  6:10 AM   Specimen: STOOL  Result Value Ref  Range Status   C Diff antigen POSITIVE (A) NEGATIVE Final   C Diff toxin NEGATIVE NEGATIVE Final   C Diff interpretation Results are indeterminate. See PCR results.  Final    Comment: Performed at [facility], [address] [place], [place] [address]  C. Diff by PCR, Reflexed     Status: Abnormal   Collection Time: 02/06/21  6:10 AM  Result Value Ref Range Status   Toxigenic C. Difficile by PCR POSITIVE (A) NEGATIVE Final    Comment: Positive for toxigenic C. difficile with little to no toxin production. Only treat if clinical presentation suggests symptomatic illness. Performed at [facility], [address] [place], [place] [address]   MRSA PCR Screening     Status: Abnormal   Collection Time: 02/06/21  1:29 PM   Specimen: Nasopharyngeal  Result Value Ref Range Status   MRSA by PCR POSITIVE (A) NEGATIVE Final    Comment:        The GeneXpert MRSA Assay (FDA approved for NASAL specimens only), is one component of a comprehensive MRSA colonization surveillance program. It is not intended to diagnose MRSA infection nor to guide or monitor treatment for MRSA infections. CRITICAL RESULT CALLED TO, READ BACK BY AND VERIFIED WITH: BURNS,RN @1539  ON 05.17.22.SH Performed at [facility], [address] [place], [place] [address]   Culture, Respiratory w Gram Stain     Status: None (Preliminary result)   Collection Time: 02/10/21  8:23 AM   Specimen: Tracheal Aspirate; Respiratory  Result Value Ref Range Status   Specimen Description   Final    TRACHEAL ASPIRATE Performed at [facility], [address] [place], [place] [address]    Special Requests   Final    NONE Performed  at [facility], [address] [place], [place] [address]    Gram Stain   Final    ABUNDANT WBC PRESENT,BOTH PMN AND MONONUCLEAR ABUNDANT GRAM NEGATIVE RODS Performed at [facility], [place] [address] [place], [place] [address]    Culture PENDING  Incomplete   Report Status PENDING  Incomplete  Culture, Respiratory w Gram Stain     Status: None (Preliminary result)   Collection Time: 02/10/21  9:10 PM   Specimen: Tracheal Aspirate  Result Value Ref Range Status   Specimen Description   Final    TRACHEAL ASPIRATE Performed at [facility], [address], [place], [place] [address]    Special Requests   Final    NONE Performed at [facility], [address] [place], [place] [address]    Gram Stain   Final    MODERATE WBC PRESENT,BOTH PMN AND MONONUCLEAR MODERATE SQUAMOUS EPITHELIAL CELLS PRESENT MODERATE GRAM NEGATIVE RODS Performed at [facility], [place] [address] [place],  [address]    Culture PENDING  Incomplete   Report Status PENDING  Incomplete         Radiology Studies: No results found.      Scheduled Meds: . apixaban  2.5 mg Per Tube BID  . bisacodyl  10 mg Rectal QHS  . chlorhexidine  15 mL Mouth Rinse BID  . Chlorhexidine Gluconate Cloth  6 each Topical Daily  . collagenase   Topical Daily  . famotidine  20 mg Per Tube Daily  . feeding supplement (OSMOLITE 1.5 CAL)  474 mL Per Tube QID  . feeding supplement (PROSource TF)  45 mL Per Tube TID  . FLUoxetine  40 mg  Per Tube Daily  . free water  400 mL Per Tube Q2H  . guaiFENesin  20 mL Per Tube Q6H  . mouth rinse  15 mL Mouth Rinse q12n4p  . mupirocin ointment  1 application Nasal BID  . nutrition supplement (JUVEN)  1 packet Per Tube BID BM  . scopolamine  1 patch Transdermal Q72H  . traZODone  50 mg Per Tube QHS   Continuous Infusions: . ertapenem Stopped (02/10/21 1037)  . vancomycin 750 mg (02/11/21 0007)     LOS: 6 days    Time spent:  25 mins     Wyvonnia Dusky, MD [facility] Pager [phone]  If 7PM-7AM, please contact night-coverage 02/11/2021, 7:39 AM

## 2021-02-12 ENCOUNTER — Encounter: Payer: Self-pay | Admitting: Internal Medicine

## 2021-02-12 LAB — CBC
HCT: 24.8 % — ABNORMAL LOW (ref 39.0–52.0)
Hemoglobin: 8.3 g/dL — ABNORMAL LOW (ref 13.0–17.0)
MCH: 29.9 pg (ref 26.0–34.0)
MCHC: 33.5 g/dL (ref 30.0–36.0)
MCV: 89.2 fL (ref 80.0–100.0)
Platelets: 184 10*3/uL (ref 150–400)
RBC: 2.78 MIL/uL — ABNORMAL LOW (ref 4.22–5.81)
RDW: 16.4 % — ABNORMAL HIGH (ref 11.5–15.5)
WBC: 7.2 10*3/uL (ref 4.0–10.5)
nRBC: 0.3 % — ABNORMAL HIGH (ref 0.0–0.2)

## 2021-02-12 LAB — COMPREHENSIVE METABOLIC PANEL
ALT: 28 U/L (ref 0–44)
AST: 24 U/L (ref 15–41)
Albumin: 2.4 g/dL — ABNORMAL LOW (ref 3.5–5.0)
Alkaline Phosphatase: 137 U/L — ABNORMAL HIGH (ref 38–126)
Anion gap: 7 (ref 5–15)
BUN: 32 mg/dL — ABNORMAL HIGH (ref 8–23)
CO2: 24 mmol/L (ref 22–32)
Calcium: 9.4 mg/dL (ref 8.9–10.3)
Chloride: 111 mmol/L (ref 98–111)
Creatinine, Ser: 0.93 mg/dL (ref 0.61–1.24)
GFR, Estimated: >60 mL/min (ref >60)
Glucose, Bld: 167 mg/dL — ABNORMAL HIGH (ref 70–99)
Potassium: 4.9 mmol/L (ref 3.5–5.1)
Sodium: 142 mmol/L (ref 135–145)
Total Bilirubin: 0.8 mg/dL (ref 0.3–1.2)
Total Protein: 6.5 g/dL (ref 6.5–8.1)

## 2021-02-12 NOTE — TOC Transition Note (Signed)
Transition of Care [facility]) - CM/SW Discharge Note   Patient Details  Name: Ryan Day MRN: [id number] Date of Birth: 1951-09-12  [facility] CM/SW Contact:  Candie Chroman, LCSW Phone Number: 02/12/2021, 11:27 AM   Clinical Narrative: Patient has orders to discharge home with hospice today. [facility] hospice liaison and wife aware. EMS transport has been arranged. No further concerns. CSW signing off.  Final next level of care: Home w Hospice Care Barriers to Discharge: Barriers Resolved   Patient Goals and CMS Choice     Choice offered to / list presented to : NA  Discharge Placement                Patient to be transferred to facility by: EMS Name of family member notified: [name] Patient and family notified of of transfer: 02/12/21  Discharge Plan and Services     Post Acute Care Choice: Resumption of Svcs/PTA Provider                               Social Determinants of Health (SDOH) Interventions     Readmission Risk Interventions No flowsheet data found.

## 2021-02-12 NOTE — Progress Notes (Signed)
Patient's primary nurse at lunch. Attempted to contact Mrs. Holford to let her know patient has been picked up by EMS and was on their way. Unfortunately number continuously busy and unable to speak with Mrs. Casale.   Fuller Mandril, RN

## 2021-02-12 NOTE — Discharge Summary (Signed)
Physician Discharge Summary  Ryan Day ZDG:[id number] DOB: May 04, 1951 DOA: 02/04/2021  PCP: Valerie Roys, DO  Admit date: 02/04/2021 Discharge date: 02/12/2021  Admitted From: home  Disposition: home w/ hospice   Recommendations for Outpatient Follow-up:  1. Follow up with  Hospice provider ASAP   Home Health: no  Equipment/Devices:  Discharge Condition: hospice  CODE STATUS: DNR Diet recommendation: continue w/ tube feeds as tolerated  Brief/Interim Summary: HPI was taken from Dr. Tobie Poet: Ryan Day is a 70 y.o. 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  Per daughter at bedside this has been going on for 2 days.  She reports that the drainage is green and purulent.  She also endorses swelling that started on 02/03/2021, prompting him to present to emergency department today.  At bedside, patient was able to tell me his name, his age and he knows he is in the hospital.  He verbalizes his daughter at bedside.  He does not have any complaints he states that he is feeling just fine.  Social history: Patient lives at home with his daughter.  He denies tobacco, EtOH, recreational drug use.  Vaccination: Patient is vaccinated for COVID-19 with 2 doses  As per Dr. Mal Misty: Ryan Day is a 70 y.o. male with medical history significant forquadriplegia secondary to mechanical fall at home with C3-C6 laminectomy, chronic tracheostomy, hypertension, hyperlipidemia, dysphagia on enteral nutrition via PEG tube, chronic osteomyelitis, history of bacteremia, neurogenic bladder requiring chronic Foley catheter, protein calorie malnutrition, depression,  anxiety, insomnia. He was discharged from the hospital on 01/05/2021 with instructions to continue IV vancomycin and cefepime through 01/11/2021.  At that time, he was hospitalized for septic shock, MRSA bacteremia, Enterobacter cloacae and Enterococcus faecalis bacteremia and sacral wound infection.  He was recently enrolled in hospice care at home.  He was brought to the hospital because of yellowish-green drainage from PICC line site on the right arm.  Hospital course from Dr. Jimmye Norman 5/18-5/23/22: Pt's PICC line was removed on 02/04/21. Pt was continue on IV ertapenem throughout hospital stay. Pt's family did not want another PICC line placed and agreed to stop abxs at d/c. Furthermore, pt's AKI resolved while inpatient and pt was intermittently hypernatremic so free water flushes were continued. Of note, pt did become lethargic w/ home dose of baclofen use. Pt was d/c home to continue w/ hospice at home. For more information, please see previous progress/consult notes.   Discharge Diagnoses:  Principal Problem:   AKI (acute kidney injury) ([place]) Active Problems:   Hypertension   Elevated alkaline phosphatase level   Cervical spinal cord injury, sequela (HCC)   Spinal cord injury, cervical region, sequela (HCC)   Pressure injury of skin   S/P percutaneous endoscopic gastrostomy (PEG) tube placement (HCC)   Tracheostomy dependent (HCC)   Quadriplegia (HCC)   Neurogenic orthostatic hypotension (HCC)   Hypernatremia   Neurogenic bowel   Decubitus ulcer of sacral region, stage 3 ([place])  Probable PICC line infection: w/ right upper extremity cellulitis. PICC line was removed on 02/04/2021. Blood cxs NGTD.  Continue on ertapenem. PICC line will not be placed again as per pt's family request    VZD:GLOVFIEP  Hypernatremia: labile. Continue w/ free water flushes   Hx of osteomyelitis of sacrum/coccyx: continue  on IV ertapenem until 02/12/21  History of cervical spinal cord injury:  complicated by quadriplegia, dysphagia, chronic respiratory failure and urinary retention. Continue oxygen via trach collar. Continue enteral nutrition via PEG tube. Foley catheter in place for urinary retention. Continue supportive care.  Unstageable left heel necrotic decubitus ulcer, unstageable right heel necrotic decubitus ulcer: continue w/ wound care   Depression: severity unknown. Continue on home dose of fluoxetine   Insomnia: continue on home dose of trazodone   Right upper extremity swelling: [place] was neg for DVT of RUE  Discharge Instructions  Discharge Instructions    Discharge instructions   Complete by: As directed    F/u w/ hospice provider ASAP   Discharge wound care:   Complete by: As directed    1. Clean right and left heel wounds with saline, pat dry. Paint with betadine swab or betadine soaked gauze, allow to air dry. Cover with dry dressing 2. Use prevalon boots bilaterally at all times to offload pressure injuries 3. Use moisture barrier ointment over the buttocks daily and PRN after incontinence 4. Clean sacral area that is open (apply Santyl to the yellow tissue) top with dry dressing. Change daily.   Increase activity slowly   Complete by: As directed      Allergies as of 02/12/2021      Reactions   Shrimp (diagnostic)    Experiences facial droop when eating seafood      Medication List    STOP taking these medications   ertapenem  IVPB Commonly known as: INVANZ   rivaroxaban 10 MG Tabs tablet Commonly known as: XARELTO   vancomycin  IVPB     TAKE these medications   acetaminophen 160 MG/5ML solution Commonly known as: TYLENOL Place 10-20 mLs (320-640 mg total) into feeding tube every 4 (four) hours as needed for mild pain.   ascorbic acid 500 MG tablet Commonly known as: VITAMIN C Place 1 tablet (500 mg total) into feeding tube 2 (two) times daily.   baclofen 10 MG tablet Commonly known as: LIORESAL Place 1 tablet (10 mg total)  into feeding tube 4 (four) times daily.   bisacodyl 10 MG suppository Commonly known as: DULCOLAX Place 1 suppository (10 mg total) rectally at bedtime.   diclofenac Sodium 1 % Gel Commonly known as: Voltaren Apply 4 g topically 4 (four) times daily as needed (pain).   docusate sodium 100 MG capsule Commonly known as: COLACE Take 1 capsule (100 mg total) by mouth 2 (two) times daily as needed for mild constipation.   Eliquis 5 MG Tabs tablet Generic drug: apixaban Take 1 tablet by mouth 2 (two) times daily.   famotidine 20 MG tablet Commonly known as: PEPCID Place 1 tablet (20 mg total) into feeding tube daily.   ferrous sulfate 300 (60 Fe) MG/5ML syrup Place 1.3 mLs (78 mg total) into feeding tube daily.   FLUoxetine 20 MG/5ML solution Commonly known as: PROZAC Place 10 mLs (40 mg total) into feeding tube daily. What changed: Another medication with the same name was removed. Continue taking this medication, and follow the directions you see here.   free water Soln Place 400 mLs into feeding tube every 4 (four) hours. What changed: how much to take   guaiFENesin 200 MG tablet Place 2 tablets (400 mg total) into feeding tube every 6 (six) hours.   insulin aspart 100 UNIT/ML injection Commonly known as: novoLOG Inject 0-15 Units into the skin every 4 (four) hours.   ipratropium-albuterol 0.5-2.5 (3)  MG/3ML Soln Commonly known as: DUONEB Take 3 mLs by nebulization 2 (two) times daily.   lidocaine 5 % Commonly known as: LIDODERM Place 1 patch onto the skin daily. Remove & Discard patch within 12 hours or as directed by MD   midodrine 5 MG tablet Commonly known as: PROAMATINE Place 1 tablet (5 mg total) into feeding tube 2 (two) times daily with a meal.   modafinil 100 MG tablet Commonly known as: PROVIGIL Take 1 tablet (100 mg total) by mouth daily.   nutrition supplement (JUVEN) Pack Place 1 packet into feeding tube 2 (two) times daily between meals.    feeding supplement (PROSource TF) liquid Place 45 mLs into feeding tube 3 (three) times daily.   feeding supplement (OSMOLITE 1.5 CAL) Liqd Place 237 mLs into feeding tube 4 (four) times daily.   feeding supplement (OSMOLITE 1.5 CAL) Liqd Place 474 mLs into feeding tube 4 (four) times daily.   ondansetron 4 MG tablet Commonly known as: ZOFRAN Place 1 tablet (4 mg total) into feeding tube every 8 (eight) hours as needed for nausea, vomiting or refractory nausea / vomiting.   polycarbophil 625 MG tablet Commonly known as: FIBERCON Place 1 tablet (625 mg total) into feeding tube daily.   polyvinyl alcohol 1.4 % ophthalmic solution Commonly known as: LIQUIFILM TEARS Place 1 drop into both eyes as needed for dry eyes.   saccharomyces boulardii 250 MG capsule Commonly known as: FLORASTOR Place 1 capsule (250 mg total) into feeding tube 2 (two) times daily.   scopolamine 1 MG/3DAYS Commonly known as: TRANSDERM-SCOP Place 1 patch onto the skin every 3 (three) days.   sennosides 8.8 MG/5ML syrup Commonly known as: SENOKOT Place 5 mLs into feeding tube daily at 6 (six) AM. What changed: when to take this   simethicone 40 MG/0.6ML drops Commonly known as: MYLICON Place 1.2 mLs (80 mg total) into feeding tube 4 (four) times daily. What changed: when to take this   sodium chloride flush 0.9 % Soln Commonly known as: NS 10-40 mLs by Intracatheter route as needed (flush).   traZODone 50 MG tablet Commonly known as: DESYREL Place 1 tablet (50 mg total) into feeding tube at bedtime.   zinc sulfate 220 (50 Zn) MG capsule Place 1 capsule (220 mg total) into feeding tube daily.            Discharge Care Instructions  (From admission, onward)         Start     Ordered   02/12/21 0000  Discharge wound care:       Comments: 1. Clean right and left heel wounds with saline, pat dry. Paint with betadine swab or betadine soaked gauze, allow to air dry. Cover with dry dressing 2.  Use prevalon boots bilaterally at all times to offload pressure injuries 3. Use moisture barrier ointment over the buttocks daily and PRN after incontinence 4. Clean sacral area that is open (apply Santyl to the yellow tissue) top with dry dressing. Change daily.   02/12/21 0950          Allergies  Allergen Reactions  . Shrimp (Diagnostic)     Experiences facial droop when eating seafood    Consultations:   Procedures/Studies: DG Abd 1 View  Result Date: 01/15/2021 CLINICAL DATA:  Question ileus. EXAM: ABDOMEN - 1 VIEW COMPARISON:  Single-view of the abdomen 01/05/2021. FINDINGS: The bowel gas pattern is normal. No radio-opaque calculi or other significant radiographic abnormality are seen. IMPRESSION: Negative exam. Electronically Signed  By: Inge Rise M.D.   On: 01/15/2021 12:15   US Venous Img Upper Uni Right(DVT)  Result Date: 02/05/2021 CLINICAL DATA:  70 year old male with history of swelling in the right upper extremity. EXAM: UPPER EXTREMITY VENOUS DOPPLER ULTRASOUND TECHNIQUE: Gray-scale sonography with graded compression, as well as color Doppler and duplex ultrasound were performed to evaluate the upper extremity deep venous system from the level of the subclavian vein and including the jugular, axillary, basilic, radial, ulnar and upper cephalic vein. Spectral Doppler was utilized to evaluate flow at rest and with distal augmentation maneuvers. COMPARISON:  12/20/2020 FINDINGS: Contralateral Subclavian Vein: Respiratory phasicity is normal and symmetric with the symptomatic side. No evidence of thrombus. Normal compressibility. Internal Jugular Vein: No evidence of thrombus. Normal compressibility, respiratory phasicity and response to augmentation. Subclavian Vein: No evidence of thrombus. Normal compressibility, respiratory phasicity and response to augmentation. Axillary Vein: No evidence of thrombus. Normal compressibility, respiratory phasicity and response to  augmentation. Cephalic Vein: No evidence of thrombus. Normal compressibility, respiratory phasicity and response to augmentation. Basilic Vein: No evidence of thrombus. Normal compressibility, respiratory phasicity and response to augmentation. Brachial Veins: No evidence of thrombus. Normal compressibility, respiratory phasicity and response to augmentation. Radial Veins: No evidence of thrombus. Normal compressibility, respiratory phasicity and response to augmentation. Ulnar Veins: No evidence of thrombus. Normal compressibility, respiratory phasicity and response to augmentation. Other Findings:  None visualized. IMPRESSION: No evidence of deep vein thrombosis in the right upper extremity. Ruthann Cancer, MD [facility] Radiology Electronically Signed   By: Ruthann Cancer MD   On: 02/05/2021 07:47   DG Chest Port 1 View  Result Date: 02/04/2021 CLINICAL DATA:  Tracheostomy in place.  Concern for infection EXAM: PORTABLE CHEST 1 VIEW COMPARISON:  Jan 21, 2021 FINDINGS: Previously noted peripherally inserted central catheter is not appreciable on this examination. Tracheostomy catheter tip is 6.0 cm above the carina. No pneumothorax. There is atelectatic change in the left base. Lungs elsewhere clear. Heart is upper normal in size with pulmonary vascularity normal. No adenopathy. Postoperative change noted in visualized cervical spine. IMPRESSION: Previously noted central catheter not appreciable. Tracheostomy as described without pneumothorax. Atelectasis left base. Developing pneumonia superimposed on atelectasis in this area in the left base is questioned. Lungs elsewhere clear. Stable cardiac silhouette. Electronically Signed   By: Lowella Grip III M.D.   On: 02/04/2021 16:52   DG Chest Port 1 View  Result Date: 01/21/2021 CLINICAL DATA:  Pneumonia, history of hypertension EXAM: PORTABLE CHEST 1 VIEW COMPARISON:  01/18/2021 FINDINGS: Tracheostomy tube  tip terminates in the mid trachea approximately 6.1 cm from the carina. Right upper extremity PICC tip terminates in the mid SVC. Telemetry leads and external support devices overlie the chest. Some persistent bilateral interstitial and hazy opacities most pronounced in the mid to lower lungs with slight fissural thickening on the right. Small left pleural effusion remains. Stable cardiomediastinal contours with cardiomegaly. No acute osseous or soft tissue abnormality. IMPRESSION: 1. Lines and tubes as above. 2. Stable cardiomegaly 3. Unchanged bibasilar interstitial and hazy opacities, may reflect a combination of atelectasis and edema with small left effusion. Electronically Signed   By: Lovena Le M.D.   On: 01/21/2021 06:44   DG Chest Port 1 View  Result Date: 01/18/2021 CLINICAL DATA:  Pneumonia.  Tracheostomy. EXAM: PORTABLE CHEST 1 VIEW COMPARISON:  01/13/2021. FINDINGS: Tracheostomy tube and right PICC line in stable position. Cardiomegaly again noted. Slight progression of bibasilar atelectasis and infiltrates/edema.  Small left pleural effusion noted. No pneumothorax. Prior cervical fusion. IMPRESSION: 1.  Tracheostomy tube and right PICC line stable position. 2.  Cardiomegaly again noted. 3. Slight progression of bibasilar atelectasis and infiltrates/edema. Small left pleural effusion. Electronically Signed   By: Marcello Moores  Register   On: 01/18/2021 05:46      Subjective: Pt denies any complaints and states he is ready to go home   Discharge Exam: Vitals:   02/11/21 2307 02/12/21 0851  BP: 138/64 (!) 143/89  Pulse: 81 78  Resp: (!) 22 18  Temp: 98.8 F (37.1 C) 98.2 F (36.8 C)  SpO2: 100% 100%   Vitals:   02/11/21 1345 02/11/21 1957 02/11/21 2307 02/12/21 0851  BP:  127/63 138/64 (!) 143/89  Pulse:  75 81 78  Resp:  20 (!) 22 18  Temp:  98.2 F (36.8 C) 98.8 F (37.1 C) 98.2 F (36.8 C)  TempSrc:  Oral Oral Oral  SpO2: 100% 99% 100% 100%  Weight:        General: Pt is  alert, awake, not in acute distress Cardiovascular:  S1/S2 +, no rubs, no gallops Respiratory:  course breath sounds b/l  Abdominal: Soft, NT, ND, hypoactive bowel sounds  Extremities:no cyanosis    The results of significant diagnostics from this hospitalization (including imaging, microbiology, ancillary and laboratory) are listed below for reference.     Microbiology: Recent Results (from the past 240 hour(s))  Resp Panel by RT-PCR (Flu A&B, Covid) Nasopharyngeal Swab     Status: None   Collection Time: 02/04/21  2:50 PM   Specimen: Nasopharyngeal Swab; Nasopharyngeal(NP) swabs in vial transport medium  Result Value Ref Range Status   SARS Coronavirus 2 by RT PCR NEGATIVE NEGATIVE Final    Comment: (NOTE) SARS-CoV-2 target nucleic acids are NOT DETECTED.  The SARS-CoV-2 RNA is generally detectable in upper respiratory specimens during the acute phase of infection. The lowest concentration of SARS-CoV-2 viral copies this assay can detect is 138 copies/mL. A negative result does not preclude SARS-Cov-2 infection and should not be used as the sole basis for treatment or other patient management decisions. A negative result may occur with  improper specimen collection/handling, submission of specimen other than nasopharyngeal swab, presence of viral mutation(s) within the areas targeted by this assay, and inadequate number of viral copies(<138 copies/mL). A negative result must be combined with clinical observations, patient history, and epidemiological information. The expected result is Negative.  Fact Sheet for Patients:  EntrepreneurPulse.com.au  Fact Sheet for Healthcare Providers:  IncredibleEmployment.be  This test is no t yet approved or cleared by the [place] FDA and  has been authorized for detection and/or diagnosis of SARS-CoV-2 by FDA under an Emergency Use Authorization (EUA). This EUA will remain  in effect (meaning  this test can be used) for the duration of the COVID-19 declaration under Section 564(b)(1) of the Act, 21 U.S.C.section 360bbb-3(b)(1), unless the authorization is terminated  or revoked sooner.       Influenza A by PCR NEGATIVE NEGATIVE Final   Influenza B by PCR NEGATIVE NEGATIVE Final    Comment: (NOTE) The Xpert Xpress SARS-CoV-2/FLU/RSV plus assay is intended as an aid in the diagnosis of influenza from Nasopharyngeal swab specimens and should not be used as a sole basis for treatment. Nasal washings and aspirates are unacceptable for Xpert Xpress SARS-CoV-2/FLU/RSV testing.  Fact Sheet for Patients: EntrepreneurPulse.com.au  Fact Sheet for Healthcare Providers: IncredibleEmployment.be  This test is not yet approved or cleared by the  [place] [employer] FDA and has been authorized for detection and/or diagnosis of SARS-CoV-2 by FDA under an TEFL teachermergency Use Authorization (EUA). This EUA will remain in effect (meaning this test can be used) for the duration of the COVID-19 declaration under Section 564(b)(1) of the Act, 21 U.S.C. section 360bbb-3(b)(1), unless the authorization is terminated or revoked.  Performed at [facility], [address] [place], [place] [address]   Urine Culture     Status: Abnormal   Collection Time: 02/04/21  3:21 PM   Specimen: Urine, Random  Result Value Ref Range Status   Specimen Description   Final    URINE, RANDOM Performed at [facility], [address] [place], [place] [address]    Special Requests   Final    NONE Performed at [facility], [address] [place], [place] [address]    Culture >=100,000 COLONIES/mL YEAST (A)  Final   Report Status 02/06/2021 FINAL  Final  Blood culture (routine single)     Status: None   Collection Time: 02/04/21  3:21 PM   Specimen: BLOOD  Result Value Ref Range Status   Specimen Description BLOOD  LEFT ARM  Final   Special Requests    Final    BOTTLES DRAWN AEROBIC AND ANAEROBIC Blood Culture results may not be optimal due to an excessive volume of blood received in culture bottles   Culture   Final    NO GROWTH 5 DAYS Performed at [facility], [address] [place], [place] [address]    Report Status 02/09/2021 FINAL  Final  Culture, blood (single)     Status: None   Collection Time: 02/05/21  1:45 AM   Specimen: BLOOD  Result Value Ref Range Status   Specimen Description BLOOD THUMB  Final   Special Requests   Final    BOTTLES DRAWN AEROBIC ONLY Blood Culture adequate volume   Culture   Final    NO GROWTH 5 DAYS Performed at [facility], [address] [place], [place] [address]    Report Status 02/10/2021 FINAL  Final  C Difficile Quick Screen w PCR reflex     Status: Abnormal   Collection Time: 02/06/21  6:10 AM   Specimen: STOOL  Result Value Ref Range Status   C Diff antigen POSITIVE (A) NEGATIVE Final   C Diff toxin NEGATIVE NEGATIVE Final   C Diff interpretation Results are indeterminate. See PCR results.  Final    Comment: Performed at [facility], [address] [place], [place] [address]  C. Diff by PCR, Reflexed     Status: Abnormal   Collection Time: 02/06/21  6:10 AM  Result Value Ref Range Status   Toxigenic C. Difficile by PCR POSITIVE (A) NEGATIVE Final    Comment: Positive for toxigenic C. difficile with little to no toxin production. Only treat if clinical presentation suggests symptomatic illness. Performed at [facility], [address] [place], [place] [address]   MRSA PCR Screening     Status: Abnormal   Collection Time: 02/06/21  1:29 PM   Specimen: Nasopharyngeal  Result Value Ref Range Status   MRSA by PCR POSITIVE (A) NEGATIVE Final    Comment:        The GeneXpert MRSA Assay (FDA approved for NASAL specimens only), is one component of a comprehensive MRSA colonization surveillance program. It is not intended to diagnose  MRSA infection nor to guide or monitor treatment for MRSA infections. CRITICAL RESULT CALLED  TO, READ BACK BY AND VERIFIED WITH: BURNS,RN @1539  ON 05.17.22.SH Performed at [facility], [address] [place], [place] [address]   Culture, Respiratory w Gram Stain     Status: None (Preliminary result)   Collection Time: 02/10/21  8:23 AM   Specimen: Tracheal Aspirate; Respiratory  Result Value Ref Range Status   Specimen Description   Final    TRACHEAL ASPIRATE Performed at [facility], [address] [place], [place] [address]    Special Requests   Final    NONE Performed at [facility], [address] [place], [place] [address]    Gram Stain   Final    ABUNDANT WBC PRESENT,BOTH PMN AND MONONUCLEAR ABUNDANT GRAM NEGATIVE RODS    Culture   Final    CULTURE REINCUBATED FOR BETTER GROWTH Performed at [facility], [place] [address], [place], [place] [address]    Report Status PENDING  Incomplete  Culture, Respiratory w Gram Stain     Status: None (Preliminary result)   Collection Time: 02/10/21  9:10 PM   Specimen: Tracheal Aspirate  Result Value Ref Range Status   Specimen Description   Final    TRACHEAL ASPIRATE Performed at [facility], [address] [place], [place] [address]    Special Requests   Final    NONE Performed at [facility], [address] [place], [place] [address]    Gram Stain   Final    MODERATE WBC PRESENT,BOTH PMN AND MONONUCLEAR MODERATE SQUAMOUS EPITHELIAL CELLS PRESENT MODERATE GRAM NEGATIVE RODS    Culture   Final    CULTURE REINCUBATED FOR BETTER GROWTH Performed at [facility], [place] [address], [place], [place] [address]    Report Status PENDING  Incomplete     Labs: BNP (last 3 results) Recent Labs    07/25/20 1308 08/14/20 1647 12/17/20 0623  BNP 72.8 59.5 0000000*   Basic Metabolic Panel: Recent Labs  Lab 02/08/21 0439 02/09/21 0942 02/10/21 0414  02/11/21 0733 02/12/21 0507  NA 148* 145 148* 146* 142  K 5.1 4.7 4.1 3.9 4.9  CL 117* 115* 116* 112* 111  CO2 25 23 24 27 24   GLUCOSE 102* 93 113* 114* 167*  BUN 31* 33* 33* 33* 32*  CREATININE 0.86 0.80 0.92 0.80 0.93  CALCIUM 9.6 9.8 9.6 9.7 9.4   Liver Function Tests: Recent Labs  Lab 02/08/21 0439 02/09/21 0942 02/10/21 0414 02/11/21 0733 02/12/21 0507  AST 32 44* 29 26 24   ALT 43 51* 38 36 28  ALKPHOS 157* 185* 161* 158* 137*  BILITOT 0.7 0.9 0.7 0.6 0.8  PROT 6.8 6.9 7.1 7.3 6.5  ALBUMIN 2.6* 2.7* 2.7* 2.7* 2.4*   No results for input(s): LIPASE, AMYLASE in the last 168 hours. No results for input(s): AMMONIA in the last 168 hours. CBC: Recent Labs  Lab 02/06/21 0459 02/07/21 0552 02/08/21 0439 02/09/21 0942 02/10/21 0414 02/11/21 0733 02/12/21 0507  WBC 10.2 8.2 9.1 8.2 9.7 6.3 7.2  NEUTROABS 5.0 4.2  --   --   --   --   --   HGB 9.5* 8.5* 9.8* 10.8* 9.5* 9.1* 8.3*  HCT 29.7* 26.3* 29.7* 33.9* 29.2* 27.8* 24.8*  MCV 92.8 91.3 88.1 92.1 90.7 90.8 89.2  PLT 176 164 189 168 163 193 184   Cardiac Enzymes: No results for input(s): CKTOTAL, CKMB, CKMBINDEX, TROPONINI in the last 168 hours. BNP: Invalid input(s): POCBNP CBG: Recent Labs  Lab  02/08/21 1006 02/08/21 1220  GLUCAP 82 141*   D-Dimer No results for input(s): DDIMER in the last 72 hours. Hgb A1c No results for input(s): HGBA1C in the last 72 hours. Lipid Profile No results for input(s): CHOL, HDL, LDLCALC, TRIG, CHOLHDL, LDLDIRECT in the last 72 hours. Thyroid function studies No results for input(s): TSH, T4TOTAL, T3FREE, THYROIDAB in the last 72 hours.  Invalid input(s): FREET3 Anemia work up No results for input(s): VITAMINB12, FOLATE, FERRITIN, TIBC, IRON, RETICCTPCT in the last 72 hours. Urinalysis    Component Value Date/Time   COLORURINE YELLOW (A) 02/04/2021 1521   APPEARANCEUR CLOUDY (A) 02/04/2021 1521   APPEARANCEUR Cloudy (A) 12/07/2020 1055   LABSPEC 1.014 02/04/2021  1521   PHURINE 6.0 02/04/2021 1521   GLUCOSEU NEGATIVE 02/04/2021 1521   HGBUR NEGATIVE 02/04/2021 1521   BILIRUBINUR NEGATIVE 02/04/2021 1521   BILIRUBINUR Negative 12/07/2020 1055   KETONESUR NEGATIVE 02/04/2021 1521   PROTEINUR 30 (A) 02/04/2021 1521   NITRITE NEGATIVE 02/04/2021 1521   LEUKOCYTESUR LARGE (A) 02/04/2021 1521   Sepsis Labs Invalid input(s): PROCALCITONIN,  WBC,  LACTICIDVEN Microbiology Recent Results (from the past 240 hour(s))  Resp Panel by RT-PCR (Flu A&B, Covid) Nasopharyngeal Swab     Status: None   Collection Time: 02/04/21  2:50 PM   Specimen: Nasopharyngeal Swab; Nasopharyngeal(NP) swabs in vial transport medium  Result Value Ref Range Status   SARS Coronavirus 2 by RT PCR NEGATIVE NEGATIVE Final    Comment: (NOTE) SARS-CoV-2 target nucleic acids are NOT DETECTED.  The SARS-CoV-2 RNA is generally detectable in upper respiratory specimens during the acute phase of infection. The lowest concentration of SARS-CoV-2 viral copies this assay can detect is 138 copies/mL. A negative result does not preclude SARS-Cov-2 infection and should not be used as the sole basis for treatment or other patient management decisions. A negative result may occur with  improper specimen collection/handling, submission of specimen other than nasopharyngeal swab, presence of viral mutation(s) within the areas targeted by this assay, and inadequate number of viral copies(<138 copies/mL). A negative result must be combined with clinical observations, patient history, and epidemiological information. The expected result is Negative.  Fact Sheet for Patients:  BloggerCourse.com  Fact Sheet for Healthcare Providers:  SeriousBroker.it  This test is no t yet approved or cleared by the [place] FDA and  has been authorized for detection and/or diagnosis of SARS-CoV-2 by FDA under an Emergency Use Authorization (EUA). This EUA  will remain  in effect (meaning this test can be used) for the duration of the COVID-19 declaration under Section 564(b)(1) of the Act, 21 U.S.C.section 360bbb-3(b)(1), unless the authorization is terminated  or revoked sooner.       Influenza A by PCR NEGATIVE NEGATIVE Final   Influenza B by PCR NEGATIVE NEGATIVE Final    Comment: (NOTE) The Xpert Xpress SARS-CoV-2/FLU/RSV plus assay is intended as an aid in the diagnosis of influenza from Nasopharyngeal swab specimens and should not be used as a sole basis for treatment. Nasal washings and aspirates are unacceptable for Xpert Xpress SARS-CoV-2/FLU/RSV testing.  Fact Sheet for Patients: BloggerCourse.com  Fact Sheet for Healthcare Providers: SeriousBroker.it  This test is not yet approved or cleared by the [place] FDA and has been authorized for detection and/or diagnosis of SARS-CoV-2 by FDA under an Emergency Use Authorization (EUA). This EUA will remain in effect (meaning this test can be used) for the duration of the COVID-19 declaration under Section 564(b)(1) of the Act, 21 U.S.C.  section 360bbb-3(b)(1), unless the authorization is terminated or revoked.  Performed at [facility], [address] [place], [place] [address]   Urine Culture     Status: Abnormal   Collection Time: 02/04/21  3:21 PM   Specimen: Urine, Random  Result Value Ref Range Status   Specimen Description   Final    URINE, RANDOM Performed at [name], [address] [place], [place] [address]    Special Requests   Final    NONE Performed at [facility], [address] [place], [place] [address]    Culture >=100,000 COLONIES/mL YEAST (A)  Final   Report Status 02/06/2021 FINAL  Final  Blood culture (routine single)     Status: None   Collection Time: 02/04/21  3:21 PM   Specimen: BLOOD  Result Value Ref Range Status   Specimen Description BLOOD   LEFT ARM  Final   Special Requests   Final    BOTTLES DRAWN AEROBIC AND ANAEROBIC Blood Culture results may not be optimal due to an excessive volume of blood received in culture bottles   Culture   Final    NO GROWTH 5 DAYS Performed at [facility], [address] [place], [place] [address]    Report Status 02/09/2021 FINAL  Final  Culture, blood (single)     Status: None   Collection Time: 02/05/21  1:45 AM   Specimen: BLOOD  Result Value Ref Range Status   Specimen Description BLOOD THUMB  Final   Special Requests   Final    BOTTLES DRAWN AEROBIC ONLY Blood Culture adequate volume   Culture   Final    NO GROWTH 5 DAYS Performed at [facility], [address] [place], [place] [address]    Report Status 02/10/2021 FINAL  Final  C Difficile Quick Screen w PCR reflex     Status: Abnormal   Collection Time: 02/06/21  6:10 AM   Specimen: STOOL  Result Value Ref Range Status   C Diff antigen POSITIVE (A) NEGATIVE Final   C Diff toxin NEGATIVE NEGATIVE Final   C Diff interpretation Results are indeterminate. See PCR results.  Final    Comment: Performed at [facility], [address] [place], [place] [address]  C. Diff by PCR, Reflexed     Status: Abnormal   Collection Time: 02/06/21  6:10 AM  Result Value Ref Range Status   Toxigenic C. Difficile by PCR POSITIVE (A) NEGATIVE Final    Comment: Positive for toxigenic C. difficile with little to no toxin production. Only treat if clinical presentation suggests symptomatic illness. Performed at [facility], [address] [place], [place] [address]   MRSA PCR Screening     Status: Abnormal   Collection Time: 02/06/21  1:29 PM   Specimen: Nasopharyngeal  Result Value Ref Range Status   MRSA by PCR POSITIVE (A) NEGATIVE Final    Comment:        The GeneXpert MRSA Assay (FDA approved for NASAL specimens only), is one component of a comprehensive MRSA colonization surveillance program.  It is not intended to diagnose MRSA infection nor to guide or monitor treatment for MRSA infections. CRITICAL RESULT CALLED TO, READ BACK BY AND VERIFIED WITH: BURNS,RN @1539  ON 05.17.22.SH Performed at [facility], [address], [place], [place] [address]   Culture, Respiratory w Gram Stain     Status: None (Preliminary result)   Collection Time: 02/10/21  8:23 AM  Specimen: Tracheal Aspirate; Respiratory  Result Value Ref Range Status   Specimen Description   Final    TRACHEAL ASPIRATE Performed at [facility], [address] [place], [place] [address]    Special Requests   Final    NONE Performed at [facility], [address] [place], [place] [address]    Gram Stain   Final    ABUNDANT WBC PRESENT,BOTH PMN AND MONONUCLEAR ABUNDANT GRAM NEGATIVE RODS    Culture   Final    CULTURE REINCUBATED FOR BETTER GROWTH Performed at [facility], [place] [address], [place], [place] [address]    Report Status PENDING  Incomplete  Culture, Respiratory w Gram Stain     Status: None (Preliminary result)   Collection Time: 02/10/21  9:10 PM   Specimen: Tracheal Aspirate  Result Value Ref Range Status   Specimen Description   Final    TRACHEAL ASPIRATE Performed at [facility], [address], [place],  [address]    Special Requests   Final    NONE Performed at [facility], [address] [place],  [address]    Gram Stain   Final    MODERATE WBC PRESENT,BOTH PMN AND MONONUCLEAR MODERATE SQUAMOUS EPITHELIAL CELLS PRESENT MODERATE GRAM NEGATIVE RODS    Culture   Final    CULTURE REINCUBATED FOR BETTER GROWTH Performed at [facility], [place] [address] [place],  [address]    Report Status PENDING  Incomplete     Time coordinating discharge: Over 30 minutes  SIGNED:   Wyvonnia Dusky, MD  [facility] 02/12/2021, 10:05 AM Pager   If 7PM-7AM, please contact  night-coverage

## 2021-02-12 NOTE — Progress Notes (Signed)
02/12/21 1440 Pt discharged home via EMS. Discharge instructions sent in package for review, wife was not present at discharge. Attempt was made to notify family/wife prior to discharge and they were unable to be reached. NAD noted prior to discharge, pt was transferred to stretcher x4 assist. Pt trach was suctioned per pt request and no other concerns were voiced at this time.

## 2021-02-12 NOTE — Progress Notes (Signed)
Chaplain Maggie made initial visit with patient's wife, Ryan Day in the hallway outside of [place]. Visitation included space for storytelling and listening. Ryan Day spoke of her faith as a strong support during her husband's illness. She identified learning about the significance of self care while caring for her husband of 50 years. The desire to bring her husband home was evident. She spoke favorably of the relationships she has with her 5 children and 14 grandchildren. Chaplain available for continued support as needed.

## 2021-02-13 LAB — CULTURE, RESPIRATORY W GRAM STAIN

## 2021-03-01 ENCOUNTER — Telehealth: Payer: Self-pay | Admitting: Pharmacist

## 2021-03-01 NOTE — Chronic Care Management (AMB) (Signed)
Chronic Care Management Pharmacy Assistant   Name: Vencil Basnett  MRN: [id number] DOB: November 29, 1950   Reason for Encounter: Disease State COPD   Recent office visits:  10/23/20-Karen Mathis Dad, NP. Seen for Urinary tract infection. Referral to Urology. Start on (CIPRO) 500 MG tablet.  Recent consult visits:  12/06/20-Michael Dellia Nims, MD (Internal medicine, [facility]) 11/08/20-Michael Dellia Nims, MD (Internal medicine, [facility]) Hospital visits:  Medication Reconciliation was completed by comparing discharge summary, patient's EMR and Pharmacy list, and upon discussion with patient.  Admitted to the hospital on 02/04/21 due to Acute kidney Injury. Discharge date was 02/12/21. Discharged from [place]?Medications Started at [facility] Discharge:?? -None noted  Medication Changes at Hospital Discharge: -None noted  Medications Discontinued at Hospital Discharge: -None noted  Medications that remain the same after Hospital Discharge:??  -All other medications will remain the same.    Medication Reconciliation was completed by comparing discharge summary, patient's EMR and Pharmacy list, and upon discussion with patient.  Admitted to the hospital on 12/07/20 due to Septic shock. Discharge date was 01/05/21. Discharged from [place]?Medications Started at [facility] Discharge:?? -None noted  Medication Changes at Hospital Discharge: -None noted  Medications Discontinued at Hospital Discharge: -None noted  Medications that remain the same after Hospital Discharge:??  -All other medications will remain the same.    Medications: Outpatient Encounter Medications as of 03/01/2021  Medication Sig   acetaminophen (TYLENOL) 160 MG/5ML solution Place 10-20 mLs (320-640 mg total) into feeding tube every 4 (four) hours as needed for mild pain.   ascorbic acid (VITAMIN C) 500 MG tablet  Place 1 tablet (500 mg total) into feeding tube 2 (two) times daily. (Patient not taking: No sig reported)   baclofen (LIORESAL) 10 MG tablet Place 1 tablet (10 mg total) into feeding tube 4 (four) times daily.   bisacodyl (DULCOLAX) 10 MG suppository Place 1 suppository (10 mg total) rectally at bedtime.   diclofenac Sodium (VOLTAREN) 1 % GEL Apply 4 g topically 4 (four) times daily as needed (pain).   docusate sodium (COLACE) 100 MG capsule Take 1 capsule (100 mg total) by mouth 2 (two) times daily as needed for mild constipation.   ELIQUIS 5 MG TABS tablet Take 1 tablet by mouth 2 (two) times daily.   famotidine (PEPCID) 20 MG tablet Place 1 tablet (20 mg total) into feeding tube daily.   ferrous sulfate 300 (60 Fe) MG/5ML syrup Place 1.3 mLs (78 mg total) into feeding tube daily.   FLUoxetine (PROZAC) 20 MG/5ML solution Place 10 mLs (40 mg total) into feeding tube daily.   guaiFENesin 200 MG tablet Place 2 tablets (400 mg total) into feeding tube every 6 (six) hours.   insulin aspart (NOVOLOG) 100 UNIT/ML injection Inject 0-15 Units into the skin every 4 (four) hours. (Patient not taking: No sig reported)   ipratropium-albuterol (DUONEB) 0.5-2.5 (3) MG/3ML SOLN Take 3 mLs by nebulization 2 (two) times daily.   lidocaine (LIDODERM) 5 % Place 1 patch onto the skin daily. Remove & Discard patch within 12 hours or as directed by MD   midodrine (PROAMATINE) 5 MG tablet Place 1 tablet (5 mg total) into feeding tube 2 (two) times daily with a meal. (Patient not taking: No sig reported)   modafinil (PROVIGIL) 100 MG tablet Take 1 tablet (100 mg total) by mouth daily. (Patient not taking: No sig reported)   nutrition supplement, JUVEN, (JUVEN)  PACK Place 1 packet into feeding tube 2 (two) times daily between meals.   Nutritional Supplements (FEEDING SUPPLEMENT, OSMOLITE 1.5 CAL,) LIQD Place 237 mLs into feeding tube 4 (four) times daily.   Nutritional Supplements (FEEDING SUPPLEMENT, OSMOLITE 1.5 CAL,)  LIQD Place 474 mLs into feeding tube 4 (four) times daily.   Nutritional Supplements (FEEDING SUPPLEMENT, PROSOURCE TF,) liquid Place 45 mLs into feeding tube 3 (three) times daily.   ondansetron (ZOFRAN) 4 MG tablet Place 1 tablet (4 mg total) into feeding tube every 8 (eight) hours as needed for nausea, vomiting or refractory nausea / vomiting.   polycarbophil (FIBERCON) 625 MG tablet Place 1 tablet (625 mg total) into feeding tube daily. (Patient not taking: No sig reported)   polyvinyl alcohol (LIQUIFILM TEARS) 1.4 % ophthalmic solution Place 1 drop into both eyes as needed for dry eyes.   saccharomyces boulardii (FLORASTOR) 250 MG capsule Place 1 capsule (250 mg total) into feeding tube 2 (two) times daily. (Patient not taking: No sig reported)   scopolamine (TRANSDERM-SCOP) 1 MG/3DAYS Place 1 patch onto the skin every 3 (three) days.   sennosides (SENOKOT) 8.8 MG/5ML syrup Place 5 mLs into feeding tube daily at 6 (six) AM. (Patient taking differently: Place 5 mLs into feeding tube daily.)   simethicone (MYLICON) 40 NI/6.2VO drops Place 1.2 mLs (80 mg total) into feeding tube 4 (four) times daily. (Patient taking differently: Place 80 mg into feeding tube 4 (four) times daily -  before meals and at bedtime.)   sodium chloride flush (NS) 0.9 % SOLN 10-40 mLs by Intracatheter route as needed (flush).   traZODone (DESYREL) 50 MG tablet Place 1 tablet (50 mg total) into feeding tube at bedtime.   Water For Irrigation, Sterile (FREE WATER) SOLN Place 400 mLs into feeding tube every 4 (four) hours. (Patient taking differently: Place 300 mLs into feeding tube every 4 (four) hours.)   zinc sulfate 220 (50 Zn) MG capsule Place 1 capsule (220 mg total) into feeding tube daily. (Patient not taking: No sig reported)   No facility-administered encounter medications on file as of 03/01/2021.   Current COPD regimen:        Ipratropium-albuterol 0.5-2.5 mg/30ml take 59mL by nebulizer  No flowsheet data  found.  Any recent hospitalizations or ED visits since last visit with CPP? Yes  Patients wife reports COPD symptoms, including Shortness of breath at rest  What recent interventions/DTPs have been made by any provider to improve breathing since last visit:None noted  Have you had exacerbation/flare-up since last visit? Yes  What do you do when you are short of breath?          Patient's wife states he uses his nebulizer  Respiratory Devices/Equipment Do you have a nebulizer? Yes Do you use a Peak Flow Meter? No Do you use a maintenance inhaler? No How often do you forget to use your daily inhaler? Patient wife states he does not use a nebulizer Do you use a rescue inhaler? No How often do you use your rescue inhaler?  Patient states he does not use a rescue inhaler Do you use a spacer with your inhaler? No  Adherence Review: Does the patient have >5 day gap between last estimated fill date for maintenance inhaler medications? No  Patient's wife states the patient is in Hospice care.  Star Rating Drugs: None noted  Corrie Mckusick, [place]

## 2021-03-09 ENCOUNTER — Telehealth: Payer: Self-pay

## 2021-03-09 NOTE — Telephone Encounter (Signed)
Called patient wife, no answer left VM to confirm with wife if patient is under hospice care because we received a form for wound supplies. If patient is under hospice care, they should be taking care of supplies per Dr. Wynetta Emery.

## 2021-03-16 ENCOUNTER — Ambulatory Visit (INDEPENDENT_AMBULATORY_CARE_PROVIDER_SITE_OTHER): Payer: BC Managed Care – PPO | Admitting: General Practice

## 2021-03-16 ENCOUNTER — Telehealth: Payer: Self-pay | Admitting: General Practice

## 2021-03-16 ENCOUNTER — Telehealth: Payer: Self-pay

## 2021-03-16 DIAGNOSIS — I10 Essential (primary) hypertension: Secondary | ICD-10-CM

## 2021-03-16 DIAGNOSIS — G8250 Quadriplegia, unspecified: Secondary | ICD-10-CM

## 2021-03-16 DIAGNOSIS — S14109S Unspecified injury at unspecified level of cervical spinal cord, sequela: Secondary | ICD-10-CM

## 2021-03-16 DIAGNOSIS — L89153 Pressure ulcer of sacral region, stage 3: Secondary | ICD-10-CM

## 2021-03-16 NOTE — Telephone Encounter (Signed)
  Chronic Care Management   Note  03/16/2021 Name: Ryan Day MRN: [id number] DOB: [birth year]  Closing CCM services. The patient has been enrolled with Hospice. CCM services ended.   Follow up plan: No further follow up required: patient is  now a patient with Hospice.  Noreene Larsson RN, MSN, [facility] Mobile: [phone]

## 2021-03-16 NOTE — Chronic Care Management (AMB) (Signed)
   03/16/2021  Ryan Day 05/07/1951 [id number]   Closing this patients care plan and CCM services has ended. The patient is actively enrolled in Hospice care.   No further outreach needed from the CCM team.  Noreene Larsson RN, MSN, [facility] Mobile: [phone]

## 2021-03-23 DEATH — deceased

## 2021-04-09 ENCOUNTER — Telehealth: Payer: Self-pay
